# Patient Record
Sex: Female | Born: 1990 | Marital: Single | Smoking: Former smoker
Health system: Northeastern US, Academic
[De-identification: ages and names within clinical notes are randomized; demographics above are authoritative.]

## PROBLEM LIST (undated history)

## (undated) ENCOUNTER — Emergency Department: Discharge status: Absent without leave | Payer: Medicaid Other

## (undated) DIAGNOSIS — F32A Depression, unspecified: Secondary | ICD-10-CM

## (undated) DIAGNOSIS — O1490 Unspecified pre-eclampsia, unspecified trimester: Secondary | ICD-10-CM

## (undated) DIAGNOSIS — F419 Anxiety disorder, unspecified: Secondary | ICD-10-CM

## (undated) DIAGNOSIS — O24419 Gestational diabetes mellitus in pregnancy, unspecified control: Secondary | ICD-10-CM

## (undated) DIAGNOSIS — F1910 Other psychoactive substance abuse, uncomplicated: Secondary | ICD-10-CM

## (undated) DIAGNOSIS — J45909 Unspecified asthma, uncomplicated: Secondary | ICD-10-CM

## (undated) DIAGNOSIS — B86 Scabies: Secondary | ICD-10-CM

## (undated) DIAGNOSIS — Z9151 Personal history of suicidal behavior: Secondary | ICD-10-CM

## (undated) DIAGNOSIS — F449 Dissociative and conversion disorder, unspecified: Secondary | ICD-10-CM

## (undated) DIAGNOSIS — Z915 Personal history of self-harm: Secondary | ICD-10-CM

## (undated) DIAGNOSIS — L309 Dermatitis, unspecified: Secondary | ICD-10-CM

## (undated) DIAGNOSIS — F329 Major depressive disorder, single episode, unspecified: Secondary | ICD-10-CM

## (undated) DIAGNOSIS — F4310 Post-traumatic stress disorder, unspecified: Secondary | ICD-10-CM

## (undated) DIAGNOSIS — F259 Schizoaffective disorder, unspecified: Secondary | ICD-10-CM

## (undated) DIAGNOSIS — F913 Oppositional defiant disorder: Secondary | ICD-10-CM

## (undated) HISTORY — DX: Dissociative and conversion disorder, unspecified: F44.9

## (undated) HISTORY — DX: Post-traumatic stress disorder, unspecified: F43.10

## (undated) HISTORY — DX: Dermatitis, unspecified: L30.9

## (undated) HISTORY — DX: Personal history of self-harm: Z91.5

## (undated) HISTORY — PX: ADENOIDECTOMY: SUR15

## (undated) HISTORY — DX: Oppositional defiant disorder: F91.3

## (undated) HISTORY — PX: TONSILLECTOMY: SUR1361

## (undated) HISTORY — DX: Major depressive disorder, single episode, unspecified: F32.9

## (undated) HISTORY — DX: Other psychoactive substance abuse, uncomplicated: F19.10

## (undated) HISTORY — DX: Personal history of suicidal behavior: Z91.51

## (undated) HISTORY — DX: Unspecified asthma, uncomplicated: J45.909

## (undated) HISTORY — DX: Gestational diabetes mellitus in pregnancy, unspecified control: O24.419

## (undated) HISTORY — DX: Unspecified pre-eclampsia, unspecified trimester: O14.90

## (undated) HISTORY — PX: TONSILLECTOMY AND ADENOIDECTOMY: SHX28

## (undated) HISTORY — PX: OTHER SURGICAL HISTORY: SHX169

## (undated) HISTORY — DX: Anxiety disorder, unspecified: F41.9

## (undated) HISTORY — DX: Scabies: B86

## (undated) HISTORY — DX: Depression, unspecified: F32.A

---
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
## 2010-02-06 ENCOUNTER — Encounter: Payer: Self-pay | Admitting: Family Medicine

## 2010-02-22 ENCOUNTER — Telehealth: Payer: Self-pay | Admitting: Family Medicine

## 2010-04-23 ENCOUNTER — Encounter: Payer: Self-pay | Admitting: Family Medicine

## 2010-04-25 ENCOUNTER — Emergency Department: Payer: Self-pay | Admitting: Emergency Medicine

## 2010-04-26 ENCOUNTER — Encounter: Payer: Self-pay | Admitting: *Deleted

## 2010-05-10 ENCOUNTER — Telehealth: Payer: Self-pay | Admitting: *Deleted

## 2010-05-24 ENCOUNTER — Ambulatory Visit: Payer: Self-pay | Admitting: Family Medicine

## 2010-05-28 ENCOUNTER — Emergency Department: Payer: Self-pay | Admitting: Emergency Medicine

## 2010-06-14 ENCOUNTER — Encounter: Payer: Self-pay | Admitting: Family Medicine

## 2010-06-17 ENCOUNTER — Encounter: Payer: Self-pay | Admitting: Family Medicine

## 2010-07-04 ENCOUNTER — Encounter: Payer: Self-pay | Admitting: Family Medicine

## 2010-10-04 ENCOUNTER — Encounter: Payer: Self-pay | Admitting: Family Medicine

## 2010-10-18 ENCOUNTER — Ambulatory Visit: Payer: Self-pay | Admitting: Family Medicine

## 2010-11-07 ENCOUNTER — Encounter: Payer: Self-pay | Admitting: Family Medicine

## 2010-11-13 ENCOUNTER — Encounter: Payer: Self-pay | Admitting: Family Medicine

## 2011-01-29 NOTE — Consult Note (Signed)
Summary: [facility] Discharge Summary  [facility] Discharge Summary   Imported By: De Nurse 07/09/2010 17:00:03  _____________________________________________________________________  External Attachment:    Type:   Image     Comment:   External Document

## 2011-01-29 NOTE — Assessment & Plan Note (Signed)
Summary: wants referral for skin,tcb   Vital Signs:  Patient profile:   20 year old female Weight:      140.9 pounds Temp:     97.1 degrees F oral Pulse rate:   90 / minute Pulse rhythm:   regular BP sitting:   115 / 77  (right arm) Cuff size:   regular  Vitals Entered By: Loralee Pacas CMA (May 24, 2010 2:38 PM)  Primary Care Provider:  Lequita Asal  MD  CC:  derm referral and contraception.  History of Present Illness: eczema vs psoriasis- has seen dermatologist. unhappy with care. would like second opinion. now having plaque formation on hands, feet, elbows. +pruritus not improved with hydroxyzine or xyzal. reports purulent drainage from plaques.   BC- would like depo shot again today.   Allergies (verified): No Known Drug Allergies  Physical Exam  General:  Well-developed,well-nourished,in no acute distress; alert,appropriate and cooperative throughout examination. vitals reviewed.  Skin:  diffuse eczematous changes on bilateral arms, legs, and back with thick scaly plaque on R hand and L ankle/foot. no evidence of super infection.    Impression & Recommendations:  Problem # 1:  SKIN RASH (ICD-782.1) Assessment Deteriorated  seems more consistent with psoriasis; however, could be severe eczema. will try increased potency steroid. patient given steroid injection. will treat with doxycycline as well, given reported purulent drainage.   The following medications were removed from the medication list:    Triamcinolone Acetonide 0.5 % Oint (Triamcinolone acetonide) .Marland Kitchen... Apply to affected areas two times a day as needed for dry skin. avoid face, underarms, and groin. disp 90 g Her updated medication list for this problem includes:    Mometasone Furoate 0.1 % Crea (Mometasone furoate) .Marland Kitchen... Apply to affected areas twice a day for 2 weeks. disp 90 g tube. do not apply to face, underarms, groin  Orders: [facility]- Est Level  3 (95621)  Problem # 2:  CONTRACEPTIVE MANAGEMENT  (ICD-V25.09) Assessment: Unchanged patient given depo.  Orders: U Preg-[facility] (30865) Depo-Provera 150mg  (H8469)  Other Orders: Dermatology Referral (Derma) Solumedrol up to 40mg  (G2952)  Patient Instructions: 1)  We will call you with information about new dermatology referral.  2)  Take ALL of the antibiotic.  Prescriptions: MOMETASONE FUROATE 0.1 % CREA (MOMETASONE FUROATE) apply to affected areas twice a day for 2 weeks. disp 90 g tube. DO NOT APPLY TO FACE, UNDERARMS, GROIN  #1 x 0   Entered and Authorized by:   Lequita Asal  MD   Signed by:   Lequita Asal  MD on 05/24/2010   Method used:   Electronically to        [facility] [phone]* (retail)       [address] [place], [place]  [address]       Ph: [phone]       Fax: [phone]   RxID:   [id number] DOXYCYCLINE HYCLATE 100 MG CAPS (DOXYCYCLINE HYCLATE) one tab by mouth two times a day x10 days.  #20 x 0   Entered and Authorized by:   Lequita Asal  MD   Signed by:   Lequita Asal  MD on 05/24/2010   Method used:   Electronically to        [facility] [phone]* (retail)       [address] [place]  [place], [place]  [address]       Ph: [phone]       Fax: [phone]   RxID:   [id number]     Laboratory Results   Urine Tests  Date/Time Received: May 24, 2010 2:49 PM  Date/Time Reported: May 24, 2010 3:08 PM     Urine HCG: negative Comments: ...........test performed by...........Marland KitchenTerese Door, CMA  Date/Time Received:  Date/Time Reported:     Medication Administration  Injection # 1:    Medication: Solumedrol up to 40mg     Diagnosis: PSORIASIS (ICD-696.1)    Route: IM    Site: L deltoid    Exp Date: 12/30/2011    Lot #: [id number]    Mfr: [email or web address]    Patient tolerated injection without complications    Given by: Loralee Pacas CMA (May 24, 2010 3:35 PM)  Injection # 2:    Medication: Depo-Provera 150mg      Diagnosis: CONTRACEPTIVE MANAGEMENT (ICD-V25.09)    Route: IM    Site: R deltoid    Exp Date: 03/29/2012    Lot #: [id number]    Mfr: pfizer    Comments: pt to rtc 08.12 thru 08.25.2011    Patient tolerated injection without complications    Given by: Loralee Pacas CMA (May 24, 2010 3:36 PM)  Orders Added: 1)  U Preg-[facility] [81025] 2)  Dermatology Referral [Derma] 3)  Solumedrol up to 40mg  [J2920] 4)  Depo-Provera 150mg  [J1055] 5)  [facility]- Est Level  3 [03474]

## 2011-01-29 NOTE — Miscellaneous (Signed)
Summary: work in skin problem  Clinical Lists Changes   no show; skin problem.Loralee Pacas CMA  April 26, 2010 3:38 PM

## 2011-01-29 NOTE — Progress Notes (Signed)
Summary: phn msg - cxl  Phone Note Call from Patient   Caller: Mom Summary of Call: could not come due to rising gas prices Initial call taken by: De Nurse,  February 22, 2010 8:44 AM  Follow-up for Phone Call        To PCP Follow-up by: Gladstone Pih,  February 22, 2010 10:51 AM

## 2011-01-29 NOTE — Assessment & Plan Note (Signed)
 Summary: fever/sore throat,df   Vital Signs:  Patient profile:   20 year old female Height:      65 inches Weight:      137.9 pounds BMI:     23.03 Temp:     99.4 degrees F oral Pulse rate:   106 / minute BP sitting:   106 / 70  (left arm) Cuff size:   regular  Vitals Entered By: Olam Keeling (December 24, 2009 2:08 PM) CC: C/O N/V, fever, body aches Is Patient Diabetic? No Pain Assessment Patient in pain? no        Primary Care Provider:  PRESTON BLOOD  MD  CC:  C/O N/V, fever, and body aches.  History of Present Illness: COUGH Onset: Several days ago Description: wet cough, persistent Modifying factors:  Happened after flu shot, associated with pain in left chest/back.  worse with cough/palpation, fevers, chills.  Symptoms Productive: white sputum Wheezing: no Dyspnea: no Nasal discharge:mild  Fever: yes Sore throat:no  Sick contacts: no Heartburn symptoms:no   History of Asthma: no  Red Flags  Weight loss:no  Hemoptysis: no Edema: no  Also here for depo shot, upreg neg.    Habits & Providers  Alcohol-Tobacco-Diet     Tobacco Status: quit     Tobacco Counseling: to quit use of tobacco products     Year Quit: 2010  Current Medications (verified): 1)  Risperdal  1 Mg Tabs (Risperidone ) .... Two Times A Day 2)  Triamcinolone  Acetonide 0.5 % Oint (Triamcinolone  Acetonide) .... Apply To Affected Areas Two Times A Day As Needed For Dry Skin. Avoid Face, Underarms, and Groin. Disp 90 G 3)  Loratadine 10 Mg  Tabs (Loratadine) .... Once Daily As Needed For Itching 4)  Hydroxyzine  Hcl 25 Mg Tabs (Hydroxyzine  Hcl) .... One Tab By Mouth At Bedtime As Needed For Itching 5)  Azithromycin  250 Mg Tabs (Azithromycin ) .... 2 Tabs By Mouth On Day 1, Then One Tab By Mouth Daily For 4 Days After That.  Allergies (verified): No Known Drug Allergies  Review of Systems       See HPI  Physical Exam  General:  Well-developed,well-nourished,in no acute distress;  alert,appropriate and cooperative throughout examination Head:  Normocephalic and atraumatic without obvious abnormalities. No apparent alopecia or balding. Eyes:  No corneal or conjunctival inflammation noted. EOMI. Perrla.  Ears:  External ear exam shows no significant lesions or deformities.  Otoscopic examination reveals clear canals, tympanic membranes are intact bilaterally without bulging, retraction, inflammation or discharge. Hearing is grossly normal bilaterally. Nose:  External nasal examination shows no deformity or inflammation. Nasal mucosa are pink and moist without lesions or exudates. Mouth:  Oral mucosa and oropharynx without lesions or exudates.  Teeth in good repair. Neck:  No deformities, masses, or tenderness noted. Lungs:  Significant crackles heard in left base.  Otherwise clear with good air movement. Heart:  Normal rate and regular rhythm. S1 and S2 normal without gallop, murmur, click, rub or other extra sounds. Abdomen:  Bowel sounds positive,abdomen soft and non-tender without masses, organomegaly or hernias noted. Extremities:  No clubbing, cyanosis, edema, or deformity noted with normal full range of motion of all joints.   Skin:  Intact without suspicious lesions or rashes Additional Exam:  CXR: LLL pneumonia   Impression & Recommendations:  Problem # 1:  COUGH (ICD-786.2) Assessment New CXR c/w pneumonia, LLL, Azithromycin  x5 d.  Outpt treatment.  Pt well appearing.  Her updated medication list for this problem includes:  Azithromycin  250 Mg Tabs (Azithromycin ) .SABRA... 2 tabs by mouth on day 1, then one tab by mouth daily for 4 days after that.  Orders: [facility]- Est  Level 4 (99214) CXR- 2view (CXR)  Problem # 2:  PNEUMONIA, LEFT LOWER LOBE (ICD-481) Assessment: New See #1. Her updated medication list for this problem includes:    Azithromycin  250 Mg Tabs (Azithromycin ) .SABRA... 2 tabs by mouth on day 1, then one tab by mouth daily for 4 days after  that.  Problem # 3:  CONTRACEPTIVE MANAGEMENT (ICD-V25.09) Assessment: Unchanged Upreg neg, depo given today.  Orders: U Preg-[facility] (81025) Depo-Provera  150mg  (J1055)  Complete Medication List: 1)  Risperdal  1 Mg Tabs (Risperidone ) .... Two times a day 2)  Triamcinolone  Acetonide 0.5 % Oint (Triamcinolone  acetonide) .... Apply to affected areas two times a day as needed for dry skin. avoid face, underarms, and groin. disp 90 g 3)  Loratadine 10 Mg Tabs (Loratadine) .... Once daily as needed for itching 4)  Hydroxyzine  Hcl 25 Mg Tabs (Hydroxyzine  hcl) .... One tab by mouth at bedtime as needed for itching 5)  Azithromycin  250 Mg Tabs (Azithromycin ) .... 2 tabs by mouth on day 1, then one tab by mouth daily for 4 days after that.  Other Orders: Urinalysis-[facility] (00000)  Patient Instructions: 1)  Take the antibiotics, get the chest Xray, 2)  Come back to see me if you still feel bad after finishing a course of antibiotics. 3)  You can use motrin  and tylenol  every 4h. 4)  Keep well hydrated. 5)  -Dr. ONEIDA. Prescriptions: AZITHROMYCIN  250 MG TABS (AZITHROMYCIN ) 2 tabs by mouth on day 1, then one tab by mouth daily for 4 days after that.  #6 x 0   Entered and Authorized by:   Debby Petties MD   Signed by:   Debby Petties MD on 12/24/2009   Method used:   Electronically to        [facility] [phone]* (retail)       [address] [place], [place]  [address]       Ph: [phone]       Fax: [phone]   RxID:   [id number]   Laboratory Results   Urine Tests  Date/Time Received: December 24, 2009 2:18 PM  Date/Time Reported: December 24, 2009 3:52 PM   Routine Urinalysis   Color: orange Appearance: Clear Glucose: negative   (Normal Range: Negative) Bilirubin: small-icto negative   (Normal Range: Negative) Ketone: negative   (Normal Range: Negative) Spec. Gravity: 1.015   (Normal Range: 1.003-1.035) Blood: small   (Normal Range:  Negative) pH: 6.5   (Normal Range: 5.0-8.0) Protein: 100   (Normal Range: Negative) Urobilinogen: >=8.0   (Normal Range: 0-1) Nitrite: negative   (Normal Range: Negative) Leukocyte Esterace: negative   (Normal Range: Negative)  Urine Microscopic WBC/HPF: 1-5 RBC/HPF: 1-5 Bacteria/HPF: 3+ Mucous/HPF: trace Epithelial/HPF: 10-20 Casts/LPF: occ granular    Urine HCG: negative Comments: ...........test performed by...........SABRAArland Morel, CMA       Medication Administration  Injection # 1:    Medication: Depo-Provera  150mg     Diagnosis: CONTRACEPTIVE MANAGEMENT (ICD-V25.09)    Route: IM    Site: L deltoid    Exp Date: 02/27/2012    Lot #: [id number]    Mfr: [email or web address]    Comments: pt to RTC 03/14 thru 03/25/2010.Bascom Pica CMA  December 24, 2009 4:00 PM  Patient tolerated injection without complications    Given by: Bascom Pica CMA (December 24, 2009 3:59 PM)  Orders Added: 1)  U Preg-[facility] [81025] 2)  [facility]- Est  Level 4 [99214] 3)  Urinalysis-[facility] [00000] 4)  Depo-Provera  150mg  [J1055] 5)  CXR- 2view [CXR]
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
## 2011-01-29 NOTE — Consult Note (Signed)
Summary: [facility]  [facility]   Imported By: Clydell Hakim 06/28/2010 11:28:48  _____________________________________________________________________  External Attachment:    Type:   Image     Comment:   External Document
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
## 2011-01-29 NOTE — Miscellaneous (Signed)
  Clinical Lists Changes  Problems: Removed problem of OTH GENERAL CNSL&ADVICE CONTRACEPT MANAGEMENT (ICD-V25.09) Removed problem of CONTRACEPTIVE MANAGEMENT (ICD-V25.09)

## 2011-01-29 NOTE — Consult Note (Signed)
Summary: [facility]   Imported By: De Nurse 11/07/2010 14:06:20  _____________________________________________________________________  External Attachment:    Type:   Image     Comment:   External Document

## 2011-01-29 NOTE — Consult Note (Signed)
Summary: [facility]   Imported By: Bradly Bienenstock 06/22/2010 13:46:53  _____________________________________________________________________  External Attachment:    Type:   Image     Comment:   External Document

## 2011-01-29 NOTE — Miscellaneous (Signed)
  Clinical Lists Changes  Medications: Removed medication of HYDROXYZINE HCL 25 MG TABS (HYDROXYZINE HCL) one tab by mouth at bedtime as needed for itching Removed medication of AZITHROMYCIN 250 MG TABS (AZITHROMYCIN) 2 tabs by mouth on day 1, then one tab by mouth daily for 4 days after that. Removed medication of LORATADINE 10 MG  TABS (LORATADINE) once daily as needed for itching
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
## 2011-01-29 NOTE — Miscellaneous (Signed)
  Clinical Lists Changes  Problems: Removed problem of ASTHMA, MILD (ICD-493.90)

## 2011-01-29 NOTE — Letter (Signed)
Summary: Generic Letter  [facility]  [address]   [place], [place] [address]   Phone: [phone]  Fax: [phone]    04/23/2010  Bridget Carpenter Champine [address] [place], [place]  [address]  Dear Bridget Carpenter and Bridget Carpenter,   It has been my pleasure to have been your physician over the last 3 years, and to have delivered Bridget Carpenter and watched her grow. I truly have enjoyed getting to know you and helping you with your healthcare needs. Over the next several weeks, you will be assigned a new provider at [facility]. He/She will have access to all of your records and continue to provide you with excellent care. If you have any questions, please feel free to contact our office. Sincerely,   Lequita Asal  MD  Appended Document: Generic Letter mailed

## 2011-02-14 ENCOUNTER — Encounter: Payer: Self-pay | Admitting: *Deleted

## 2011-02-18 ENCOUNTER — Emergency Department: Payer: Self-pay | Admitting: Emergency Medicine
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
## 2011-09-15 ENCOUNTER — Emergency Department: Payer: Self-pay | Admitting: Emergency Medicine

## 2011-09-22 ENCOUNTER — Encounter: Payer: Self-pay | Admitting: Family Medicine

## 2011-09-22 ENCOUNTER — Ambulatory Visit (INDEPENDENT_AMBULATORY_CARE_PROVIDER_SITE_OTHER): Payer: Medicaid Other | Admitting: Family Medicine

## 2011-09-22 DIAGNOSIS — F3289 Other specified depressive episodes: Secondary | ICD-10-CM

## 2011-09-22 DIAGNOSIS — J45909 Unspecified asthma, uncomplicated: Secondary | ICD-10-CM

## 2011-09-22 DIAGNOSIS — F329 Major depressive disorder, single episode, unspecified: Secondary | ICD-10-CM

## 2011-09-22 DIAGNOSIS — Z309 Encounter for contraceptive management, unspecified: Secondary | ICD-10-CM

## 2011-09-22 DIAGNOSIS — R634 Abnormal weight loss: Secondary | ICD-10-CM

## 2011-09-22 LAB — POCT URINE PREGNANCY: Preg Test, Ur: NEGATIVE

## 2011-09-22 MED ORDER — ALBUTEROL SULFATE HFA 108 (90 BASE) MCG/ACT IN AERS: 2.0000 | INHALATION_SPRAY | Freq: Four times a day (QID) | RESPIRATORY_TRACT | Status: DC | PRN

## 2011-09-22 MED ORDER — MEDROXYPROGESTERONE ACETATE 150 MG/ML IM SUSP
150.0000 mg | Freq: Once | INTRAMUSCULAR | Status: AC
  Administered 2011-09-22: 150 mg via INTRAMUSCULAR

## 2011-09-22 NOTE — Patient Instructions (Addendum)
Nice to meet you. Continue taking albuterol every 4-6 hours for wheezing or shortness of breath. Important to quit smoking for yours and Taylor's health. You have a perfectly healthy weight right now.  However, quitting smoking and depo injections can cause weight gain as side effects.  Smoking Cessation, Tips for Success YOU CAN QUIT SMOKING If you are ready to quit smoking, congratulations! You have chosen to help yourself be healthier. Cigarettes bring nicotine, tar, carbon monoxide, and other irritants into your body. Your lungs, heart, and blood vessels will be able to work better without these poisons. There are lots of different ways to quit smoking. Nicotine gum, nicotine patches, a nicotine inhaler, or nicotine nasal spray help with physical craving. Hypnosis, support groups, and medicines help break the habit of smoking. Here are some tips to help you quit for good. 1. Throw away all cigarettes.  2. Clean and remove all ashtrays from your home, work, and car.  3. On a card, write down your reasons for quitting. Carry the card with you and read it when you get the urge to smoke.  4. Cleanse your body of nicotine. Drink enough water and fluids to keep your urine clear or pale yellow. Do this after quitting to flush the nicotine from your body.  5. Learn to predict your moods. Do not let a bad situation be your excuse to have a cigarette. Some situations in your life might tempt you into wanting a cigarette.  6. Never have "just one" cigarette. It leads to wanting another and another. Remind yourself of your decision to quit.  7. Change habits associated with smoking. If you smoked while driving or when feeling stressed, try other activities to replace smoking. Stand up when drinking your coffee. Brush your teeth after eating. Sit in a different chair when you read the paper. Avoid alcohol while trying to quit, and try to drink fewer caffeinated beverages. Alcohol and caffeine may urge you to  smoke.  8. Avoid foods and drinks that can trigger a desire to smoke, such as sugary or spicy foods and alcohol.  9. Ask people who smoke not to smoke around you.  10. Have something planned to do right after eating or having a cup of coffee. Take a walk or exercise to perk you up. This will help to keep you from overeating.  11. Try a relaxation exercise to calm you down and decrease your stress. Remember, you may be tense and nervous in the first 2 weeks after you quit, but this will pass.  12. Find new activities to keep your hands busy. Play with a pen, coin, or rubber band. Doodle or draw things on paper.  13. Brush your teeth right after eating. This will help cut down on the craving for the taste of tobacco after meals. You can try mouthwash, too.  14. Use oral substitutes, such as lemon drops, carrots, a cinnamon stick, or chewing gum, in place of cigarettes. Keep them handy so they are available when you have the urge to smoke.  15. When you have the urge to smoke, try deep breathing.  16. Designate your home as a nonsmoking area.  17. If you are a heavy smoker, ask your caregiver about a prescription for nicotine chewing gum. It can ease your withdrawal from nicotine.  18. Reward yourself. Set aside the cigarette money you save and buy yourself something nice.  19. Look for support from others. Join a support group or smoking cessation program. Ask  someone at home or at work to help you with your plan to quit smoking.  20. Always ask yourself, "Do I need this cigarette or is this just a reflex?" Tell yourself, "Today, I choose not to smoke," or "I do not want to smoke." You are reminding yourself of your decision to quit, even if you do smoke a cigarette.  HOW WILL I FEEL WHEN I QUIT SMOKING?  The benefits of not smoking start within days of quitting.   You may have symptoms of withdrawal because your body is used to nicotine (the addictive substance in cigarettes). You may crave  cigarettes, be irritable, feel very hungry, cough often, get headaches, or have difficulty concentrating.   The withdrawal symptoms are only temporary. They are strongest when you first quit but will go away within 10 to 14 days.   When withdrawal symptoms occur, stay in control. Think about your reasons for quitting. Remind yourself that these are signs that your body is healing and getting used to being without cigarettes.   Remember that withdrawal symptoms are easier to treat than the major diseases that smoking can cause.   Even after the withdrawal is over, expect periodic urges to smoke. However, these cravings are generally short-lived and will go away whether you smoke or not. Do not smoke!   If you relapse and smoke again, do not lose hope. Of the people who quit, 75% relapse. Most smokers quit 3 times before they are successful.   If you relapse, do not give up! Plan ahead and think about what you will do the next time you get the urge to smoke.  LIFE AS A NONSMOKER: MAKE IT FOR A MONTH, MAKE IT FOR LIFE Day 1 Hang this page where you will see it every day. Day 2 Get rid of all ashtrays, matches, and lighters. Day 3 Drink water. Breathe deeply between sips. Day 4 Avoid places with smoke-filled air, such as bars, clubs, or the smoking section of restaurants. Day 5 Keep track of how much money you save by not smoking. Day 6 Avoid boredom. Keep a good book with you or go to the movies. Day 7 Reward yourself! One week without smoking! Day 8 Make a dental appointment to get your teeth cleaned. Day 9 Decide how you will turn down a cigarette before it is offered to you. Day 10 Review your reasons for quitting. Day 11 Distract yourself. Stay active to keep your mind off smoking and to relieve tension. Take a walk, exercise, read a book, do a crossword puzzle, or try a new hobby. Day 12 Exercise. Get off the bus before your stop or use stairs instead of escalators. Day 13 Call on  friends for support and encouragement. Day 14 Reward yourself! Two weeks without smoking! Day 15 Practice deep breathing exercises. Day 16 Bet a friend that you can stay a nonsmoker. Day 17 Ask to sit in nonsmoking sections of restaurants. Day 18 Hang up "No Smoking" signs. Day 19 Think of yourself as a nonsmoker. Day 20 Each morning, tell yourself you will not smoke. Day 21 Reward yourself! Three weeks without smoking! Day 22 Think of smoking in negative ways.  Remember how it stains your teeth, gives you bad breath, and shortens your breath. Day 23 Eat a nutritious breakfast. Day 24 Do not relive your days as a smoker. Day 25 Hold a pencil in your hand when talking on the telephone. Day 26 Tell all your friends you do not smoke.  Day 27 Think about how much better food tastes. Day 28 Remember, one cigarette is one too many. Day 29 Take up a hobby that will keep your hands busy. Day 30 Congratulations! One month without smoking! Give yourself a big reward. Your caregiver can direct you to community resources or hospitals for support, which may include:  Group support.   Education.   Hypnosis.   Subliminal therapy.  Document Released: 09/12/2004 Document Re-Released: 03/11/2010 [facility] Patient Information 2011 [place], [place].

## 2011-09-23 ENCOUNTER — Encounter: Payer: Self-pay | Admitting: Family Medicine

## 2011-09-23 DIAGNOSIS — J45909 Unspecified asthma, uncomplicated: Secondary | ICD-10-CM | POA: Insufficient documentation

## 2011-09-23 NOTE — Assessment & Plan Note (Signed)
Encouraged return to regular psychotherapy. Seen previously at [facility] by Dr. Tresa Endo but patient attempting to establish in [place].

## 2011-09-23 NOTE — Progress Notes (Signed)
  Subjective:    Patient ID: Bridget Carpenter, female    DOB: 03/18/1991, 20 y.o.   MRN: [id number]  HPI ED f/u, Patient with her godmother and 67 year old daughter today.   1. Bronchitis. Seen at [facility] ED for a case of bronchitis about one week ago. Treated with a steroid dose pack and albuterol breathing treatments. Symptoms are much improved currently. Pt is out of albuterol because her young daughter was playing with the inhaler and wasted it. Still smoking cigarettes intermitttently. Denies SOB, wheezing, fevers, CP. Still has a mild dry cough.   2. Weight. Pt complains about significant fluxuations in weight recently. Was down to 100 lbs and up to 150 lb previously. Today is 135. Her current BMI is 22, but she complains about the way she looks and wants to gain weight. No reason other than appearance. Asks about supplements or pills to take. Denies any fatigue, dyspnea, skin changes.   3. Psych. Multiple complicated psych issues noted including multiple suicide attempts, dissociative disorder, etc. Patient states that she is currently not on any medications or therapy schedule, but does intend to restart therapy at a practice in [place] and has made contact already. Denies current exacerbation.   4. Contraception. Desires to restart depo injections today. Has used in the past, last was one year ago. Currently has regular menses, LMP one week ago.   Review of Systems See HPI otherwise negative.    Objective:   Physical Exam  Vitals reviewed. Constitutional: She is oriented to person, place, and time. She appears well-developed and well-nourished. No distress.  HENT:  Head: Normocephalic and atraumatic.  Eyes: EOM are normal. Pupils are equal, round, and reactive to light.  Neck: Neck supple. No thyromegaly present.  Cardiovascular: Normal rate, regular rhythm, normal heart sounds and intact distal pulses.   No murmur heard. Pulmonary/Chest: Effort normal. No respiratory  distress. She has wheezes. She has no rales. She exhibits no tenderness.       Mild expiratory wheezes throughout bilateral lung fields.  Abdominal: Soft. She exhibits no distension. There is no tenderness.  Musculoskeletal: She exhibits no edema and no tenderness.  Neurological: She is alert and oriented to person, place, and time.  Skin: She is not diaphoretic.  Psychiatric: She has a normal mood and affect.          Assessment & Plan:

## 2011-09-23 NOTE — Assessment & Plan Note (Signed)
Likely secondary to unstable living situation and recent psychiatric treatments (review of weight indicates she was low prior to her dissociative episode/suicide attempts) then gained with psychotropic medications. Reassured patient that her weight was perfectly healthy currently, though she desires to gain weight. Offered nutrition counseling but not interested. Follow up if continues unintentional weight loss or has fevers, fatigue, other associated symptoms.

## 2011-09-23 NOTE — Assessment & Plan Note (Signed)
Treated for viral bronchitis one week ago, still with some asymptomatic bronchoconstriction. Will refill albuterol inhaler and advised pt to take 2 treatments q4 hr then prn for wheezing, dyspnea. No signs of infection currently. Strongly advised smoking cessation. F/u in one week if not improved, may need repeat or longer prednisone taper.

## 2011-09-23 NOTE — Assessment & Plan Note (Signed)
To check upreg and start depo if negative. Would be a good candidate for implanon or IUD if patient willing in the future.

## 2011-09-25 LAB — COMPREHENSIVE METABOLIC PANEL
ALT: 10
Albumin: 4
Alkaline Phosphatase: 85
BUN: 9
Chloride: 101
Glucose, Bld: 72
Potassium: 3.8
Sodium: 138
Total Bilirubin: 0.8

## 2011-09-25 LAB — DRUGS OF ABUSE SCREEN W/O ALC, ROUTINE URINE
Benzodiazepines.: NEGATIVE
Cocaine Metabolites: NEGATIVE
Methadone: NEGATIVE
Opiate Screen, Urine: NEGATIVE
Phencyclidine (PCP): NEGATIVE

## 2011-09-25 LAB — URINE MICROSCOPIC-ADD ON

## 2011-09-25 LAB — URINALYSIS, ROUTINE W REFLEX MICROSCOPIC
Bilirubin Urine: NEGATIVE
Bilirubin Urine: NEGATIVE
Glucose, UA: NEGATIVE
Glucose, UA: NEGATIVE
Hgb urine dipstick: NEGATIVE
Ketones, ur: NEGATIVE
Protein, ur: NEGATIVE
Urobilinogen, UA: 1
pH: 6

## 2011-09-25 LAB — GC/CHLAMYDIA PROBE AMP, URINE

## 2011-09-25 LAB — CBC
Hemoglobin: 13.2
RBC: 4.35

## 2011-09-25 LAB — DIFFERENTIAL
Basophils Relative: 0
Eosinophils Absolute: 0.5
Eosinophils Relative: 4
Monocytes Relative: 7
Neutrophils Relative %: 70

## 2011-09-25 LAB — URINE CULTURE: Culture: NO GROWTH

## 2011-09-26 LAB — DIFFERENTIAL
Basophils Absolute: 0
Eosinophils Absolute: 0.3
Eosinophils Relative: 5
Lymphocytes Relative: 41
Monocytes Absolute: 0.5

## 2011-09-26 LAB — COMPREHENSIVE METABOLIC PANEL
ALT: 9
AST: 15
Alkaline Phosphatase: 73
CO2: 26
Calcium: 9.2
Chloride: 105
Glucose, Bld: 91
Potassium: 4.3
Sodium: 139

## 2011-09-26 LAB — CBC
Platelets: 234
RBC: 3.97
WBC: 5.5

## 2011-09-30 LAB — COMPREHENSIVE METABOLIC PANEL
Alkaline Phosphatase: 63
BUN: 14
Chloride: 107
Creatinine, Ser: 0.78
Glucose, Bld: 84
Potassium: 3.7
Total Bilirubin: 1.6 — ABNORMAL HIGH
Total Protein: 7.1

## 2011-09-30 LAB — AMPHETAMINES URINE CONFIRMATION
Amphetamines: 39000 ng/mL
Methamphetamine GC/MS, Ur: NEGATIVE
Methylenedioxyamphetamine: NEGATIVE

## 2011-09-30 LAB — URINALYSIS, ROUTINE W REFLEX MICROSCOPIC
Glucose, UA: NEGATIVE
Ketones, ur: NEGATIVE
Nitrite: NEGATIVE
Protein, ur: NEGATIVE
Urobilinogen, UA: 0.2

## 2011-09-30 LAB — BARBITURATE, URINE, CONFIRMATION
Amobarbital UR Quant: NEGATIVE
Butabarbital UR Quant: NEGATIVE
Secobarbital GC/MS Conf: NEGATIVE

## 2011-09-30 LAB — DRUGS OF ABUSE SCREEN W/O ALC, ROUTINE URINE
Amphetamine Screen, Ur: POSITIVE — AB
Cocaine Metabolites: NEGATIVE
Marijuana Metabolite: POSITIVE — AB
Opiate Screen, Urine: NEGATIVE
Propoxyphene: NEGATIVE

## 2011-09-30 LAB — PREGNANCY, URINE: Preg Test, Ur: NEGATIVE

## 2011-09-30 LAB — THC (MARIJUANA), URINE, CONFIRMATION: Marijuana, Ur-Confirmation: 28 ng/mL

## 2011-10-03 ENCOUNTER — Encounter: Payer: Self-pay | Admitting: Family Medicine

## 2011-10-10 LAB — CBC
HCT: 33.2 — ABNORMAL LOW
MCHC: 35.1
MCV: 90.8
Platelets: 300
WBC: 17.2 — ABNORMAL HIGH

## 2011-10-13 LAB — POCT URINALYSIS DIP (DEVICE)
Bilirubin Urine: NEGATIVE
Bilirubin Urine: NEGATIVE
Bilirubin Urine: NEGATIVE
Bilirubin Urine: NEGATIVE
Hgb urine dipstick: NEGATIVE
Hgb urine dipstick: NEGATIVE
Hgb urine dipstick: NEGATIVE
Hgb urine dipstick: NEGATIVE
Ketones, ur: NEGATIVE
Ketones, ur: NEGATIVE
Ketones, ur: NEGATIVE
Nitrite: NEGATIVE
Nitrite: NEGATIVE
Protein, ur: NEGATIVE
pH: 6
pH: 6.5
pH: 5.5
pH: 7

## 2011-10-14 LAB — URINALYSIS, ROUTINE W REFLEX MICROSCOPIC
Bilirubin Urine: NEGATIVE
Hgb urine dipstick: NEGATIVE
Nitrite: NEGATIVE
Specific Gravity, Urine: 1.025
pH: 6

## 2011-10-14 LAB — WET PREP, GENITAL
Clue Cells Wet Prep HPF POC: NONE SEEN
Trich, Wet Prep: NONE SEEN

## 2011-10-30 ENCOUNTER — Ambulatory Visit: Payer: Medicaid Other | Admitting: Family Medicine

## 2011-10-31 ENCOUNTER — Encounter: Payer: Self-pay | Admitting: Family Medicine

## 2011-10-31 ENCOUNTER — Ambulatory Visit (INDEPENDENT_AMBULATORY_CARE_PROVIDER_SITE_OTHER): Payer: Medicaid Other | Admitting: Family Medicine

## 2011-10-31 DIAGNOSIS — H9190 Unspecified hearing loss, unspecified ear: Secondary | ICD-10-CM

## 2011-10-31 DIAGNOSIS — J339 Nasal polyp, unspecified: Secondary | ICD-10-CM

## 2011-10-31 MED ORDER — FLUTICASONE PROPIONATE 50 MCG/ACT NA SUSP: 2.0000 | Freq: Every day | NASAL | Status: DC

## 2011-10-31 NOTE — Progress Notes (Signed)
  Subjective:    Patient ID: [name], female    DOB: [birth year], 20 y.o.   MRN: [id number]  HPI Pt Bridget/o today of difficulty hearing of her right ear. She started to notice it a couple of days ago. No pain, afebrile. Mentions she has been congested lately and her right nostril bothers her. There is no pain but there is sensation of congestion and like something is occluding. Her left nostril feels as usual. There is not history of viral infection recently. No history of sick contacts. No nausea, vertigo, balance problems or changes in vision.   Review of Systems   Per HPI  Objective:   Physical Exam  Constitutional: She is oriented to person, place, and time. She appears well-developed and well-nourished. No distress.  HENT:  Left Ear: External ear normal.  Mouth/Throat: No oropharyngeal exudate.       EARS: Right ear 100% occluded by wax. Left ear with some wax present but TM visible and normal. After ear lavage: Right ear with visible TM, no perforations, or fluid levels. Hearing back to normal. NOSE: Right nostril with mucosa hypertrophy and a lesion polyp like occluding 95%. No nasal discharge no pain to palpation of adjacent sinus. Left nostril: hypertrophic mucosa and also diminished opening about 50 %.  Eyes: Pupils are equal, round, and reactive to light.  Cardiovascular: Normal rate.   Pulmonary/Chest: Breath sounds normal.  Abdominal: Bowel sounds are normal.  Musculoskeletal: She exhibits no edema.  Lymphadenopathy:    She has no cervical adenopathy.  Neurological: She is alert and oriented to person, place, and time. No cranial nerve deficit.          Assessment & Plan:

## 2011-10-31 NOTE — Assessment & Plan Note (Signed)
Right nostril with mucosal hypertrophy and 95% occluded with tissue( polyp like). Left nostril with mucosal hypertrophy but moderated. Plan: Flonase 2 puff a day per nostril and  ENT evaluation.

## 2011-10-31 NOTE — Patient Instructions (Addendum)
It has been a pleasure to meet you. Please take medications as prescribed. It is important to keep the appointment for ENT to further evaluate your nasal lesions. Make a followup appointment with your  PCP in a month.

## 2011-11-03 ENCOUNTER — Telehealth: Payer: Self-pay | Admitting: *Deleted

## 2011-11-03 NOTE — Telephone Encounter (Signed)
Pt's grandmother informed of appt with Dr. Willeen Cass at [facility] on 11.16.2012 @130  pm. Expressed the importance of no show and that if this appt does not work for her she will need to call their office at  Phone: [phone] to cancel/reschedule or she MAY be charged a fee. We are NOT responsible for rescheduling this appt for her. Grandmother understood and will relay message to pt.Loralee Pacas [place]

## 2011-12-05 ENCOUNTER — Emergency Department: Payer: Self-pay | Admitting: Emergency Medicine

## 2012-02-26 ENCOUNTER — Encounter: Payer: Medicaid Other | Admitting: Family Medicine

## 2012-10-12 ENCOUNTER — Emergency Department: Payer: Self-pay | Admitting: Emergency Medicine

## 2012-10-12 LAB — COMPREHENSIVE METABOLIC PANEL
Albumin: 3.5 g/dL (ref 3.4–5.0)
Alkaline Phosphatase: 75 U/L (ref 50–136)
BUN: 9 mg/dL (ref 7–18)
EGFR (African American): >60
Glucose: 86 mg/dL (ref 65–99)
SGOT(AST): 26 U/L (ref 15–37)
SGPT (ALT): 14 U/L (ref 12–78)
Total Protein: 7.2 g/dL (ref 6.4–8.2)

## 2012-10-12 LAB — SALICYLATE LEVEL: Salicylates, Serum: <1.7 mg/dL

## 2012-10-12 LAB — TSH: Thyroid Stimulating Horm: 1.59 u[IU]/mL

## 2012-10-12 LAB — CBC
HGB: 13.1 g/dL (ref 12.0–16.0)
MCHC: 34 g/dL (ref 32.0–36.0)
RBC: 4.05 10*6/uL (ref 3.80–5.20)

## 2012-10-12 LAB — ACETAMINOPHEN LEVEL: Acetaminophen: <2 ug/mL

## 2012-11-26 ENCOUNTER — Emergency Department: Payer: Self-pay | Admitting: Emergency Medicine

## 2012-11-26 LAB — COMPREHENSIVE METABOLIC PANEL
Alkaline Phosphatase: 68 U/L (ref 50–136)
Calcium, Total: 8.7 mg/dL (ref 8.5–10.1)
Chloride: 107 mmol/L (ref 98–107)
Co2: 26 mmol/L (ref 21–32)
Creatinine: 0.71 mg/dL (ref 0.60–1.30)
EGFR (Non-African Amer.): >60
Osmolality: 275 (ref 275–301)
SGOT(AST): 23 U/L (ref 15–37)
SGPT (ALT): 14 U/L (ref 12–78)

## 2012-11-26 LAB — CBC
HCT: 38.2 % (ref 35.0–47.0)
MCH: 31.3 pg (ref 26.0–34.0)
MCV: 94 fL (ref 80–100)
Platelet: 285 10*3/uL (ref 150–440)
RBC: 4.06 10*6/uL (ref 3.80–5.20)
WBC: 10.5 10*3/uL (ref 3.6–11.0)

## 2012-11-26 LAB — SALICYLATE LEVEL: Salicylates, Serum: <1.7 mg/dL

## 2012-11-26 LAB — DRUG SCREEN, URINE
Amphetamines, Ur Screen: NEGATIVE (ref ?–1000)
Cannabinoid 50 Ng, Ur ~~LOC~~: NEGATIVE (ref ?–50)
MDMA (Ecstasy)Ur Screen: NEGATIVE (ref ?–500)
Methadone, Ur Screen: NEGATIVE (ref ?–300)
Opiate, Ur Screen: NEGATIVE (ref ?–300)
Phencyclidine (PCP) Ur S: NEGATIVE (ref ?–25)

## 2012-11-26 LAB — ACETAMINOPHEN LEVEL: Acetaminophen: <2 ug/mL

## 2012-12-06 ENCOUNTER — Emergency Department: Payer: Self-pay | Admitting: Emergency Medicine

## 2012-12-06 LAB — URINALYSIS, COMPLETE
Bacteria: NONE SEEN
Nitrite: NEGATIVE
Ph: 6 (ref 4.5–8.0)
RBC,UR: 1 /HPF (ref 0–5)
Specific Gravity: 1.009 (ref 1.003–1.030)
WBC UR: 2 /HPF (ref 0–5)

## 2012-12-06 LAB — CBC
HCT: 38.6 % (ref 35.0–47.0)
HGB: 13.6 g/dL (ref 12.0–16.0)
MCH: 33.5 pg (ref 26.0–34.0)
MCHC: 35.2 g/dL (ref 32.0–36.0)
MCV: 95 fL (ref 80–100)
RDW: 13.4 % (ref 11.5–14.5)

## 2012-12-06 LAB — COMPREHENSIVE METABOLIC PANEL
Albumin: 4 g/dL (ref 3.4–5.0)
Alkaline Phosphatase: 80 U/L (ref 50–136)
BUN: 18 mg/dL (ref 7–18)
Chloride: 109 mmol/L — ABNORMAL HIGH (ref 98–107)
Creatinine: 1.09 mg/dL (ref 0.60–1.30)
EGFR (African American): >60
EGFR (Non-African Amer.): >60
Glucose: 83 mg/dL (ref 65–99)
SGPT (ALT): 13 U/L (ref 12–78)
Sodium: 141 mmol/L (ref 136–145)
Total Protein: 8.2 g/dL (ref 6.4–8.2)

## 2012-12-06 LAB — ETHANOL
Ethanol %: <0.003 % (ref 0.000–0.080)
Ethanol: <3 mg/dL

## 2012-12-06 LAB — SALICYLATE LEVEL: Salicylates, Serum: <1.7 mg/dL

## 2012-12-06 LAB — DRUG SCREEN, URINE
Amphetamines, Ur Screen: NEGATIVE (ref ?–1000)
Cocaine Metabolite,Ur ~~LOC~~: POSITIVE (ref ?–300)
MDMA (Ecstasy)Ur Screen: NEGATIVE (ref ?–500)
Opiate, Ur Screen: NEGATIVE (ref ?–300)
Phencyclidine (PCP) Ur S: NEGATIVE (ref ?–25)
Tricyclic, Ur Screen: NEGATIVE (ref ?–1000)

## 2012-12-06 LAB — TSH: Thyroid Stimulating Horm: 2.69 u[IU]/mL

## 2013-02-21 ENCOUNTER — Emergency Department: Payer: Self-pay | Admitting: Unknown Physician Specialty

## 2013-02-21 LAB — URINALYSIS, COMPLETE
Bilirubin,UR: NEGATIVE
Blood: NEGATIVE
Glucose,UR: NEGATIVE mg/dL (ref 0–75)
Leukocyte Esterase: NEGATIVE
Ph: 7 (ref 4.5–8.0)
Protein: NEGATIVE
Squamous Epithelial: <1
WBC UR: <1 /HPF (ref 0–5)

## 2013-02-21 LAB — COMPREHENSIVE METABOLIC PANEL
Albumin: 3.9 g/dL (ref 3.4–5.0)
Alkaline Phosphatase: 74 U/L (ref 50–136)
Anion Gap: 5 — ABNORMAL LOW (ref 7–16)
Bilirubin,Total: 0.3 mg/dL (ref 0.2–1.0)
Chloride: 105 mmol/L (ref 98–107)
Co2: 26 mmol/L (ref 21–32)
Creatinine: 0.85 mg/dL (ref 0.60–1.30)
Glucose: 83 mg/dL (ref 65–99)
Osmolality: 270 (ref 275–301)
Potassium: 3.8 mmol/L (ref 3.5–5.1)
SGOT(AST): 23 U/L (ref 15–37)

## 2013-02-21 LAB — CBC
Platelet: 244 10*3/uL (ref 150–440)
RBC: 4.19 10*6/uL (ref 3.80–5.20)
WBC: 6.7 10*3/uL (ref 3.6–11.0)

## 2013-02-21 LAB — LIPASE, BLOOD: Lipase: 130 U/L (ref 73–393)

## 2013-08-03 ENCOUNTER — Emergency Department: Payer: Self-pay | Admitting: Emergency Medicine

## 2013-08-03 LAB — URINALYSIS, COMPLETE
Ketone: NEGATIVE
Ph: 6 (ref 4.5–8.0)
Protein: NEGATIVE
RBC,UR: 2 /HPF (ref 0–5)
Specific Gravity: 1.016 (ref 1.003–1.030)

## 2013-08-14 ENCOUNTER — Inpatient Hospital Stay: Payer: Self-pay | Admitting: Psychiatry

## 2013-08-14 LAB — URINALYSIS, COMPLETE
Bilirubin,UR: NEGATIVE
Blood: NEGATIVE
Glucose,UR: NEGATIVE mg/dL (ref 0–75)
Leukocyte Esterase: NEGATIVE
Nitrite: NEGATIVE
Ph: 6 (ref 4.5–8.0)
Protein: NEGATIVE
Specific Gravity: 1.026 (ref 1.003–1.030)
WBC UR: 2 /HPF (ref 0–5)

## 2013-08-14 LAB — CBC
HCT: 39.1 % (ref 35.0–47.0)
MCHC: 34.3 g/dL (ref 32.0–36.0)
MCV: 92 fL (ref 80–100)
Platelet: 247 10*3/uL (ref 150–440)
RBC: 4.25 10*6/uL (ref 3.80–5.20)
RDW: 13 % (ref 11.5–14.5)
WBC: 6.5 10*3/uL (ref 3.6–11.0)

## 2013-08-14 LAB — COMPREHENSIVE METABOLIC PANEL
Albumin: 3.8 g/dL (ref 3.4–5.0)
Anion Gap: 5 — ABNORMAL LOW (ref 7–16)
BUN: 13 mg/dL (ref 7–18)
Calcium, Total: 8.6 mg/dL (ref 8.5–10.1)
Co2: 28 mmol/L (ref 21–32)
Creatinine: 0.88 mg/dL (ref 0.60–1.30)
EGFR (African American): >60
Glucose: 90 mg/dL (ref 65–99)
Osmolality: 275 (ref 275–301)
SGOT(AST): 24 U/L (ref 15–37)
Sodium: 138 mmol/L (ref 136–145)
Total Protein: 7.9 g/dL (ref 6.4–8.2)

## 2013-08-14 LAB — ETHANOL
Ethanol %: <0.003 % (ref 0.000–0.080)
Ethanol: <3 mg/dL

## 2013-08-14 LAB — PREGNANCY, URINE: Pregnancy Test, Urine: NEGATIVE m[IU]/mL

## 2013-08-16 LAB — DRUG SCREEN, URINE
Amphetamines, Ur Screen: NEGATIVE (ref ?–1000)
Benzodiazepine, Ur Scrn: NEGATIVE (ref ?–200)
Cocaine Metabolite,Ur ~~LOC~~: POSITIVE (ref ?–300)
MDMA (Ecstasy)Ur Screen: NEGATIVE (ref ?–500)
Methadone, Ur Screen: NEGATIVE (ref ?–300)

## 2013-10-29 ENCOUNTER — Emergency Department: Payer: Self-pay | Admitting: Emergency Medicine

## 2013-12-17 ENCOUNTER — Emergency Department: Payer: Self-pay | Admitting: Emergency Medicine

## 2013-12-17 LAB — COMPREHENSIVE METABOLIC PANEL
Albumin: 3.4 g/dL (ref 3.4–5.0)
Alkaline Phosphatase: 85 U/L
BUN: 16 mg/dL (ref 7–18)
Calcium, Total: 9.6 mg/dL (ref 8.5–10.1)
Co2: 30 mmol/L (ref 21–32)
Creatinine: 0.94 mg/dL (ref 0.60–1.30)
EGFR (African American): >60
Glucose: 80 mg/dL (ref 65–99)
Potassium: 4 mmol/L (ref 3.5–5.1)
SGOT(AST): 38 U/L — ABNORMAL HIGH (ref 15–37)
SGPT (ALT): 24 U/L (ref 12–78)
Sodium: 138 mmol/L (ref 136–145)
Total Protein: 7.7 g/dL (ref 6.4–8.2)

## 2013-12-17 LAB — URINALYSIS, COMPLETE
Glucose,UR: NEGATIVE mg/dL (ref 0–75)
Leukocyte Esterase: NEGATIVE
Nitrite: NEGATIVE
Ph: 6 (ref 4.5–8.0)
Protein: NEGATIVE
Specific Gravity: 1.023 (ref 1.003–1.030)

## 2013-12-17 LAB — DRUG SCREEN, URINE
Amphetamines, Ur Screen: NEGATIVE (ref ?–1000)
Benzodiazepine, Ur Scrn: NEGATIVE (ref ?–200)
Cocaine Metabolite,Ur ~~LOC~~: NEGATIVE (ref ?–300)
Methadone, Ur Screen: NEGATIVE (ref ?–300)
Phencyclidine (PCP) Ur S: NEGATIVE (ref ?–25)
Tricyclic, Ur Screen: NEGATIVE (ref ?–1000)

## 2013-12-17 LAB — CBC
HCT: 37.6 % (ref 35.0–47.0)
MCH: 31 pg (ref 26.0–34.0)
Platelet: 258 10*3/uL (ref 150–440)

## 2013-12-17 LAB — ETHANOL
Ethanol %: <0.003 % (ref 0.000–0.080)
Ethanol: <3 mg/dL

## 2014-01-04 ENCOUNTER — Emergency Department: Payer: Self-pay | Admitting: Emergency Medicine

## 2014-01-04 LAB — DRUG SCREEN, URINE

## 2014-01-04 LAB — COMPREHENSIVE METABOLIC PANEL
ALBUMIN: 3.2 g/dL — AB (ref 3.4–5.0)
ALK PHOS: 73 U/L
ANION GAP: 5 — AB (ref 7–16)
BILIRUBIN TOTAL: 0.3 mg/dL (ref 0.2–1.0)
BUN: 14 mg/dL (ref 7–18)
CO2: 27 mmol/L (ref 21–32)
CREATININE: 0.8 mg/dL (ref 0.60–1.30)
Calcium, Total: 8.7 mg/dL (ref 8.5–10.1)
Chloride: 107 mmol/L (ref 98–107)
EGFR (African American): >60
Glucose: 87 mg/dL (ref 65–99)
Osmolality: 277 (ref 275–301)
Potassium: 3.7 mmol/L (ref 3.5–5.1)
SGOT(AST): 35 U/L (ref 15–37)
SGPT (ALT): 18 U/L (ref 12–78)
Sodium: 139 mmol/L (ref 136–145)
Total Protein: 7.5 g/dL (ref 6.4–8.2)

## 2014-01-04 LAB — CBC
HCT: 36.1 % (ref 35.0–47.0)
HGB: 12.2 g/dL (ref 12.0–16.0)
MCH: 31.7 pg (ref 26.0–34.0)
MCHC: 33.9 g/dL (ref 32.0–36.0)
MCV: 94 fL (ref 80–100)
Platelet: 245 10*3/uL (ref 150–440)
RBC: 3.85 10*6/uL (ref 3.80–5.20)
RDW: 13.8 % (ref 11.5–14.5)
WBC: 9.2 10*3/uL (ref 3.6–11.0)

## 2014-01-04 LAB — ACETAMINOPHEN LEVEL: Acetaminophen: <2 ug/mL

## 2014-01-04 LAB — PREGNANCY, URINE: PREGNANCY TEST, URINE: NEGATIVE m[IU]/mL

## 2014-01-04 LAB — TSH: Thyroid Stimulating Horm: 2.64 u[IU]/mL

## 2014-01-04 LAB — ETHANOL
Ethanol %: <0.003 % (ref 0.000–0.080)
Ethanol: <3 mg/dL

## 2014-01-04 LAB — SALICYLATE LEVEL

## 2014-02-04 ENCOUNTER — Ambulatory Visit: Payer: Self-pay

## 2014-02-04 LAB — URINALYSIS, COMPLETE
BACTERIA: NEGATIVE
BILIRUBIN, UR: NEGATIVE
Blood: NEGATIVE
GLUCOSE, UR: NEGATIVE mg/dL (ref 0–75)
KETONE: NEGATIVE
LEUKOCYTE ESTERASE: NEGATIVE
Nitrite: NEGATIVE
PH: 6 (ref 4.5–8.0)
Protein: NEGATIVE
SPECIFIC GRAVITY: 1.029 (ref 1.003–1.030)

## 2014-02-10 ENCOUNTER — Emergency Department: Payer: Self-pay | Admitting: Emergency Medicine

## 2014-02-10 LAB — URINALYSIS, COMPLETE
BACTERIA: NONE SEEN
BILIRUBIN, UR: NEGATIVE
BLOOD: NEGATIVE
Glucose,UR: NEGATIVE mg/dL (ref 0–75)
Ketone: NEGATIVE
Nitrite: NEGATIVE
Ph: 6 (ref 4.5–8.0)
Protein: NEGATIVE
SPECIFIC GRAVITY: 1.025 (ref 1.003–1.030)
Squamous Epithelial: 5
WBC UR: 3 /HPF (ref 0–5)

## 2014-02-10 LAB — COMPREHENSIVE METABOLIC PANEL
Albumin: 3.7 g/dL (ref 3.4–5.0)
Alkaline Phosphatase: 68 U/L
Anion Gap: 7 (ref 7–16)
BILIRUBIN TOTAL: 0.3 mg/dL (ref 0.2–1.0)
BUN: 19 mg/dL — ABNORMAL HIGH (ref 7–18)
CALCIUM: 9.8 mg/dL (ref 8.5–10.1)
CREATININE: 0.61 mg/dL (ref 0.60–1.30)
Chloride: 105 mmol/L (ref 98–107)
Co2: 24 mmol/L (ref 21–32)
EGFR (African American): >60
Glucose: 95 mg/dL (ref 65–99)
Osmolality: 274 (ref 275–301)
Potassium: 3.9 mmol/L (ref 3.5–5.1)
SGOT(AST): 29 U/L (ref 15–37)
SGPT (ALT): 17 U/L (ref 12–78)
Sodium: 136 mmol/L (ref 136–145)
TOTAL PROTEIN: 8.5 g/dL — AB (ref 6.4–8.2)

## 2014-02-10 LAB — ETHANOL
Ethanol %: <0.003 % (ref 0.000–0.080)
Ethanol: <3 mg/dL

## 2014-02-10 LAB — CBC
HCT: 40.4 % (ref 35.0–47.0)
HGB: 13.3 g/dL (ref 12.0–16.0)
MCH: 30.9 pg (ref 26.0–34.0)
MCHC: 33 g/dL (ref 32.0–36.0)
MCV: 94 fL (ref 80–100)
Platelet: 247 10*3/uL (ref 150–440)
RBC: 4.32 10*6/uL (ref 3.80–5.20)
RDW: 13.9 % (ref 11.5–14.5)
WBC: 11.4 10*3/uL — ABNORMAL HIGH (ref 3.6–11.0)

## 2014-02-10 LAB — TSH: Thyroid Stimulating Horm: 2.58 u[IU]/mL

## 2014-02-10 LAB — SALICYLATE LEVEL

## 2014-02-10 LAB — DRUG SCREEN, URINE

## 2014-02-10 LAB — ACETAMINOPHEN LEVEL: Acetaminophen: <2 ug/mL

## 2014-08-13 ENCOUNTER — Inpatient Hospital Stay: Payer: Self-pay | Admitting: Psychiatry

## 2014-08-13 LAB — CBC
HCT: 43.7 % (ref 35.0–47.0)
HGB: 14.3 g/dL (ref 12.0–16.0)
MCH: 31.6 pg (ref 26.0–34.0)
MCHC: 32.6 g/dL (ref 32.0–36.0)
MCV: 97 fL (ref 80–100)
PLATELETS: 263 10*3/uL (ref 150–440)
RBC: 4.52 10*6/uL (ref 3.80–5.20)
RDW: 13.1 % (ref 11.5–14.5)
WBC: 9.8 10*3/uL (ref 3.6–11.0)

## 2014-08-13 LAB — ETHANOL: Ethanol %: <0.003 % (ref 0.000–0.080)

## 2014-08-13 LAB — URINALYSIS, COMPLETE
GLUCOSE, UR: NEGATIVE mg/dL (ref 0–75)
Hyaline Cast: 17
Leukocyte Esterase: NEGATIVE
NITRITE: NEGATIVE
PH: 5 (ref 4.5–8.0)
RBC,UR: 280 /HPF (ref 0–5)
Specific Gravity: 1.031 (ref 1.003–1.030)
Squamous Epithelial: 1
WBC UR: 6 /HPF (ref 0–5)

## 2014-08-13 LAB — DRUG SCREEN, URINE
Amphetamines, Ur Screen: NEGATIVE (ref ?–1000)
BARBITURATES, UR SCREEN: NEGATIVE (ref ?–200)
Benzodiazepine, Ur Scrn: NEGATIVE (ref ?–200)
CANNABINOID 50 NG, UR ~~LOC~~: POSITIVE (ref ?–50)
COCAINE METABOLITE, UR ~~LOC~~: POSITIVE (ref ?–300)
MDMA (ECSTASY) UR SCREEN: NEGATIVE (ref ?–500)
Methadone, Ur Screen: NEGATIVE (ref ?–300)
Opiate, Ur Screen: NEGATIVE (ref ?–300)
Phencyclidine (PCP) Ur S: NEGATIVE (ref ?–25)
Tricyclic, Ur Screen: NEGATIVE (ref ?–1000)

## 2014-08-13 LAB — COMPREHENSIVE METABOLIC PANEL
ALK PHOS: 58 U/L
ANION GAP: 9 (ref 7–16)
Albumin: 3.9 g/dL (ref 3.4–5.0)
BILIRUBIN TOTAL: 1.1 mg/dL — AB (ref 0.2–1.0)
BUN: 11 mg/dL (ref 7–18)
CHLORIDE: 110 mmol/L — AB (ref 98–107)
CO2: 25 mmol/L (ref 21–32)
Calcium, Total: 8.8 mg/dL (ref 8.5–10.1)
Creatinine: 1.23 mg/dL (ref 0.60–1.30)
GLUCOSE: 87 mg/dL (ref 65–99)
Osmolality: 286 (ref 275–301)
Potassium: 3.2 mmol/L — ABNORMAL LOW (ref 3.5–5.1)
SGOT(AST): 29 U/L (ref 15–37)
SGPT (ALT): 14 U/L
SODIUM: 144 mmol/L (ref 136–145)
Total Protein: 8 g/dL (ref 6.4–8.2)

## 2014-08-13 LAB — PREGNANCY, URINE: PREGNANCY TEST, URINE: NEGATIVE m[IU]/mL

## 2014-08-13 LAB — SALICYLATE LEVEL: SALICYLATES, SERUM: 2.2 mg/dL

## 2014-08-13 LAB — CK
CK, TOTAL: 368 U/L — AB
CK, Total: 602 U/L — ABNORMAL HIGH

## 2014-08-13 LAB — ACETAMINOPHEN LEVEL

## 2014-10-20 ENCOUNTER — Emergency Department: Payer: Self-pay | Admitting: Emergency Medicine

## 2014-10-20 LAB — URINALYSIS, COMPLETE
Blood: NEGATIVE
Glucose,UR: NEGATIVE mg/dL (ref 0–75)
KETONE: NEGATIVE
Leukocyte Esterase: NEGATIVE
Nitrite: NEGATIVE
PH: 5 (ref 4.5–8.0)
Specific Gravity: 1.03 (ref 1.003–1.030)
Squamous Epithelial: 2
WBC UR: 3 /HPF (ref 0–5)

## 2014-10-20 LAB — DRUG SCREEN, URINE
AMPHETAMINES, UR SCREEN: NEGATIVE (ref ?–1000)
BARBITURATES, UR SCREEN: NEGATIVE (ref ?–200)
Benzodiazepine, Ur Scrn: NEGATIVE (ref ?–200)
Cannabinoid 50 Ng, Ur ~~LOC~~: POSITIVE (ref ?–50)
Cocaine Metabolite,Ur ~~LOC~~: POSITIVE (ref ?–300)
MDMA (Ecstasy)Ur Screen: NEGATIVE (ref ?–500)
METHADONE, UR SCREEN: NEGATIVE (ref ?–300)
Opiate, Ur Screen: NEGATIVE (ref ?–300)
Phencyclidine (PCP) Ur S: NEGATIVE (ref ?–25)
Tricyclic, Ur Screen: NEGATIVE (ref ?–1000)

## 2014-10-20 LAB — COMPREHENSIVE METABOLIC PANEL
ALK PHOS: 64 U/L
ANION GAP: 6 — AB (ref 7–16)
AST: 26 U/L (ref 15–37)
Albumin: 3.7 g/dL (ref 3.4–5.0)
BUN: 10 mg/dL (ref 7–18)
Bilirubin,Total: 0.9 mg/dL (ref 0.2–1.0)
CHLORIDE: 112 mmol/L — AB (ref 98–107)
Calcium, Total: 8.7 mg/dL (ref 8.5–10.1)
Co2: 26 mmol/L (ref 21–32)
Creatinine: 0.93 mg/dL (ref 0.60–1.30)
EGFR (African American): >60
EGFR (Non-African Amer.): >60
Glucose: 101 mg/dL — ABNORMAL HIGH (ref 65–99)
Osmolality: 286 (ref 275–301)
POTASSIUM: 3.3 mmol/L — AB (ref 3.5–5.1)
SGPT (ALT): 18 U/L
SODIUM: 144 mmol/L (ref 136–145)
TOTAL PROTEIN: 7.6 g/dL (ref 6.4–8.2)

## 2014-10-20 LAB — CBC
HCT: 39.8 % (ref 35.0–47.0)
HGB: 12.7 g/dL (ref 12.0–16.0)
MCH: 30.7 pg (ref 26.0–34.0)
MCHC: 31.9 g/dL — AB (ref 32.0–36.0)
MCV: 96 fL (ref 80–100)
Platelet: 277 10*3/uL (ref 150–440)
RBC: 4.14 10*6/uL (ref 3.80–5.20)
RDW: 14.1 % (ref 11.5–14.5)
WBC: 8.9 10*3/uL (ref 3.6–11.0)

## 2014-10-20 LAB — ETHANOL: Ethanol: <3 mg/dL (ref 0–80)

## 2014-10-20 LAB — SALICYLATE LEVEL: Salicylates, Serum: <1.7 mg/dL

## 2014-10-20 LAB — ACETAMINOPHEN LEVEL: Acetaminophen: <2 ug/mL

## 2014-10-21 LAB — URINE CULTURE

## 2014-10-31 ENCOUNTER — Emergency Department: Payer: Self-pay | Admitting: Emergency Medicine

## 2014-10-31 LAB — COMPREHENSIVE METABOLIC PANEL
ALBUMIN: 4.1 g/dL (ref 3.4–5.0)
ANION GAP: 5 — AB (ref 7–16)
Alkaline Phosphatase: 71 U/L
BUN: 11 mg/dL (ref 7–18)
Bilirubin,Total: 0.5 mg/dL (ref 0.2–1.0)
CALCIUM: 9.2 mg/dL (ref 8.5–10.1)
Chloride: 108 mmol/L — ABNORMAL HIGH (ref 98–107)
Co2: 28 mmol/L (ref 21–32)
Creatinine: 0.94 mg/dL (ref 0.60–1.30)
Glucose: 84 mg/dL (ref 65–99)
Osmolality: 280 (ref 275–301)
Potassium: 3.8 mmol/L (ref 3.5–5.1)
SGOT(AST): 17 U/L (ref 15–37)
SGPT (ALT): 17 U/L
Sodium: 141 mmol/L (ref 136–145)
Total Protein: 8.5 g/dL — ABNORMAL HIGH (ref 6.4–8.2)

## 2014-10-31 LAB — CBC
HCT: 42.6 % (ref 35.0–47.0)
HGB: 13.8 g/dL (ref 12.0–16.0)
MCH: 31.4 pg (ref 26.0–34.0)
MCHC: 32.4 g/dL (ref 32.0–36.0)
MCV: 97 fL (ref 80–100)
PLATELETS: 256 10*3/uL (ref 150–440)
RBC: 4.4 10*6/uL (ref 3.80–5.20)
RDW: 13.9 % (ref 11.5–14.5)
WBC: 7.7 10*3/uL (ref 3.6–11.0)

## 2014-10-31 LAB — HCG, QUANTITATIVE, PREGNANCY

## 2014-10-31 LAB — ETHANOL

## 2015-04-17 NOTE — Consult Note (Signed)
Brief Consult Note: Diagnosis: Mood disorder NOS.   Patient was seen by consultant.   Consult note dictated.   Recommend further assessment or treatment.   Orders entered.   Comments: [name] has a h/o schizoaffectiove disorder and substance abuse. She wa s brought to the hosptal agitated and suicidal in the context of family conflict and coacine abuse. She is cool and collected. She is no longer suicidal or homicidal. Gaynelle AduSher is able to CFS. She decl\ines substance abuse treatment. She agrees to injections of Invega sustenna to improve compliance.    PLAN: 1. The patient no longer meets criteria for IVC. I will terminate proceeding. Please discharge as appropriate.  2. Will give first dose of 234 mg of Invega sustenna. She will be given the second ionjection of 156 mg in 5-8 days. She is to continue oral Risperdal as prescribed by her ACT team until then. No Rx necessary.  3. She will folow up with her [facility] and Drt. Lateef.  4. ACT team or her mother can pick her up from. ER.  Electronic Signatures: Kristine LineaPucilowska, Verleen Stuckey (MD)  (Signed 12-Dec-13 12:12)  Authored: Brief Consult Note   Last Updated: 12-Dec-13 12:12 by Kristine LineaPucilowska, Declan Adamson (MD)

## 2015-04-17 NOTE — Consult Note (Signed)
PATIENT NAME:  [name], [name] MR#:  [id number] DATE OF BIRTH:  1991/07/04  DATE OF CONSULTATION:  12/09/2012  REFERRING PHYSICIAN:  Caleen Jobs. Brien Mates, MD    CONSULTING PHYSICIAN:  Larsen Zettel B. Graiden Henes, MD  REASON FOR CONSULTATION: To evaluate a suicidal patient.   IDENTIFYING DATA: Bridget Carpenter is a 24 year old female with history of schizoaffective disorder and substance abuse.   CHIEF COMPLAINT: "I'm fine now."   HISTORY OF PRESENT ILLNESS: Bridget Carpenter has a long history of mental illness with multiple prior hospitalizations. She was brought to the hospital by police. We have different accounts of what happened prior. The patient claims that she was caught having sex with a boyfriend by her mother. The mother does not like the man, so she called her ACT Team. The patient refused to talk to ACT Team, and they called the police. We know from Waldo from [employer] that he was also involved and that the patient has been making suicidal threats.  Indeed, she has a series of superficial small cuts on her forearm. The patient adamantly denies feeling suicidal. She believe that conflict with the mother over a boyfriend led to her hospitalization. On admission she was also positive for cocaine. She denies any symptoms of depression, anxiety, or psychosis. She denies alcohol or prescription pills use. She does admit to smoking crack. The patient has been in the care of [facility]. They do not have a good relationship. I was surprised to hear that when the patient asked to be on injectable antipsychotic, Dr. Cherylann Ratel felt that it would be better for her to take oral medications. She has been treated with Risperdal in the past and currently should be taking 2 mg of Risperdal twice daily. She believes that medication is helpful. She also admits that when using drugs she oftentimes forgets the medication.   PAST PSYCHIATRIC HISTORY: She has been hospitalized several times at [facility], [facility], [facility] and  at [facility]. She was diagnosed with schizoaffective disorder and psychotic disorder in the past. During her hospitalization at [facility], her diagnoses included mood disorder, not otherwise specified, PTSD and ADD; and she was given Adderall as only treatment at discharge in 2010. The patient has a history of multiple suicide attempts and apparently has been chronically suicidal. Reportedly there is some history of violence, but we were never able to find any evidence of that; and the patient has been usually cooperative and pleasant in the Emergency Room. She was in the Emergency Room of [facility] on December 2nd following a drug binge and being confused and agitated. She was discharged to home then.   FAMILY PSYCHIATRIC HISTORY: None reported.   PAST MEDICAL HISTORY: Eczema.   ALLERGIES: No known drug allergies.   MEDICATIONS ON ADMISSION: Risperdal 2 mg twice daily, Clobetasol 0.05% cream twice daily, triamcinolone 0.1% ointment twice daily, Vistaril 50 mg twice daily.   SOCIAL HISTORY: She lives with her mother. She has a 67-year-old daughter who reportedly is the product of incest when the patient was 15. The patient has Medicaid.   REVIEW OF SYSTEMS: CONSTITUTIONAL: No fevers or chills. No weight changes. EYES: No double or blurred vision. ENT: No hearing loss. RESPIRATORY: No shortness of breath or cough. CARDIOVASCULAR: No chest pain or orthopnea. GASTROINTESTINAL: No abdominal pain, nausea, vomiting, or diarrhea. GU: No incontinence or frequency. ENDOCRINE: No heat or cold intolerance. LYMPHATIC: No anemia or easy bruising. INTEGUMENTARY:  Positive for eczema on  her arms and superficial scratches. MUSCULOSKELETAL: No muscle or joint pain. NEUROLOGIC: No tingling or weakness. PSYCHIATRIC: See history of present illness for details.   PHYSICAL EXAMINATION:  VITAL SIGNS: Blood pressure 101/56, pulse 88, respirations 18,  temperature 98.5.   GENERAL: This is a slender young female in no acute distress. The rest of the physical examination is deferred to her primary attending.   LABORATORY DATA: Chemistries are within normal limits. Blood alcohol level is zero. LFTs within normal limits. TSH 2.69. Urine tox screen positive for cocaine. CBC within normal limits except for white blood count of 13.2. Urinalysis 1+ leukocyte esterase. Urine pregnancy test is negative. Serum acetaminophen and salicylates are low.   MENTAL STATUS EXAMINATION: The patient is alert and oriented to person, place, time, and situation. She is pleasant, polite and cooperative. She recognizes me from our previous encounter 10 days or so ago. She is in hospital scrubs. She maintains good eye contact. Her speech is soft. Her mood is good with full affect. She is quite funny, appropriately so. Her thoughts are logical. There are no suicidal ideations. No psychotic symptoms. Her cognition is grossly intact. She registers 3 out of 3 and recalls 3 out of 3 objects after 5 minutes. She can spell world. She knows the current president. Her insight and judgment are questionable.   SUICIDE RISK ASSESSMENT: This is a patient with a long history of mental illness, mood instability, substance abuse and multiple suicide attempts in the past who appears to be chronically suicidal but is ready to accept medications and agrees to work with her [facility]. She is chronically at increased risk of suicide because of the nature of her illness and ongoing substance abuse.   DIAGNOSES:  AXIS I: Schizoaffective disorder, bipolar type; cocaine abuse.   AXIS II: Deferred.   AXIS III: Eczema.   AXIS IV: Mental illness, substance abuse.   AXIS V: Global Assessment of Functioning score 45.   PLAN:  1. The patient no longer meets criteria for involuntary inpatient psychiatric commitment. I will terminate proceedings. Please discharge as appropriate.  2. She is to  continue Risperdal 2 mg twice daily for another week. We will offer Invega Sustenna injection 234 mg today. The next dose of 156 mg will be given by her ACT Team within 5 to 8 days following discharge.  3. She will follow up with Dr. Cherylann RatelLateef of [facility].  4. She will be discharged to home. She has the keys to the house. She will either be picked up by her ACT Team or by her mother after work after 6:00 tonight.  ____________________________ Ellin GoodieJolanta B. Jennet MaduroPucilowska, MD jbp:cbb D: 12/09/2012 14:08:32 ET T: 12/09/2012 18:07:52 ET JOB#: [id number]  cc: Shreyansh Tiffany B. Jennet MaduroPucilowska, MD, <Dictator> Shari ProwsJOLANTA B Abigail Teall MD ELECTRONICALLY SIGNED 12/11/2012 2:18

## 2015-04-17 NOTE — Consult Note (Signed)
PATIENT NAME:  Bridget Carpenter, [name] MR#:  [id number] DATE OF BIRTH:  1991/11/15  DATE OF CONSULTATION:  11/27/2012  REFERRING PHYSICIAN:   CONSULTING PHYSICIAN:  Kareema Keitt K. Malaiah Viramontes, MD  SUBJECTIVE: The patient is a 24 year old African American female who is single who presented to the Emergency Room voluntarily with ideas of suicide and she reports she  drove herself to the ER. . She reports that Vistaril does not help and Vistaril makes her anxious and agitated and she wants to be detoxed from the same. According to information obtained from the chart, the patient had some crazy thoughts of strapping her legs to two different objects and making them pull her apart, putting bleach in her food, and running in front of traffic. In addition, she has past history of three suicide attempts by cutting and jumping out of moving motor vehicle. The patient was last seen when she came from the jail and she was stabilized and sent back to jail.   ALCOHOL AND DRUGS: The patient denies the same.   HOME MEDICATIONS: 1. Triamcinolone to be applied to affected areas.  2. Clobetasol 0.05% topical cream. 3. Clobex topical spray. 4. Vistaril 25 mg one capsule twice a day. 5. She was on Risperdal and Topamax in the past.   OBJECTIVE: The patient is disheveled in appearance. Alert and oriented. Speech is pressured. She is rambling. She jumps from one subject to the other. She is not able to focus and pay attention. She jumps from one topic to another and stated that she has a 24-year-old at home and so she has to take care and she wants to go and be detoxed from Vistaril because Vistaril makes her have crazy thoughts. She denies any suicidal thoughts, though according to information obtained she had made several suicide attempts in the past. Denies any voices or seeing things but gets agitated and upset and becomes argumentative very easily. She does admit to strange thoughts which are intrusive and she admits that she has  strange nightmares in which she thinks she's already dead and wakes up and is not sure if she is dead or not and continues to talk at this rate. Insight and judgment guarded versus impaired at this time.   IMPRESSION: Psychosis, not otherwise specified.  PLAN: Will start patient on Risperdal 2 mg p.o. b.i.d. and hopefully she will calm down enough and then further plans can be made to see if she can be transferred to inpatient unit or appropriate placement has to be considered.   ____________________________ Jannet MantisSurya K. Guss Bundehalla, MD skc:drc D: 11/27/2012 20:40:40 ET T: 11/28/2012 11:46:39 ET JOB#: [id number]  cc: Monika SalkSurya K. Guss Bundehalla, MD, <Dictator> Beau FannySURYA K Niam Nepomuceno MD ELECTRONICALLY SIGNED 11/28/2012 16:26

## 2015-04-17 NOTE — Consult Note (Signed)
Brief Consult Note: Diagnosis: Mood disorder NOS.   Patient was seen by consultant.   Consult note dictated.   Recommend further assessment or treatment.   Orders entered.   Comments: Bridget Carpenter has a h/o mood instability. She came to the hospital complaining of SE from Vistaril prescribed by her primary psychiatrist. She no longer feels "strange". She is cool aqnd collected. She tolerates Risperdal well.   PLAN: 1. The patient no longer meets criteria for IVC. I will terminate proceeding. Please discharge as appropriate.  2. She is to continue Risperdal as prescribed by her ACT team. No Rx necessary.  3. She will folow up with her ACT team.  4. She will need a taxi vaucher.  Electronic Signatures: Kristine LineaPucilowska, Abrahan Fulmore (MD)  (Signed 02-Dec-13 15:49)  Authored: Brief Consult Note   Last Updated: 02-Dec-13 15:49 by Kristine LineaPucilowska, Tashyra Adduci (MD)

## 2015-04-20 NOTE — Discharge Summary (Signed)
PATIENT NAME:  Th[place], [name] MR#:  [id number] DATE OF BIRTH:  [birth year]  DATE OF ADMISSION:  08/14/2013 DATE OF DISCHARGE:  08/22/2013  HOSPITAL COURSE: See dictated history and physical for details of admission. A 24 year old woman admitted to the hospital complaining of worsening mood symptoms, suicidal thoughts, negative thoughts about herself. Also admitted to be using cocaine and drinking. Also claimed to be very concerned about having trouble with attention and focus. In the hospital, the patient was treated with a combination of medication and individual and group psychotherapy. She did not engage in any dangerous or aggressive behavior in the hospital. She had difficulty tolerating medications. She became jittery and agitated with antidepressants, including fluoxetine and bupropion. The patient eventually requested to be taken off of all antidepressants. She stated that she felt better and was having fewer side effects off of them. By the time of discharge, she stated that she felt that her substance abuse and fatigue were probably primary issues. She preferred to stay off of medication. She was not displaying any psychotic symptoms. She was totally denying suicidal ideation. She had plans for follow-up in the community. The patient was only using a ProAir inhaler for asthma.  She was discharged home with follow up scheduled with the [facility].   DISCHARGE MEDICATIONS: ProAir inhaler on a p.r.n. basis for asthma.   LABORATORY RESULTS: Admission labs included drug screen positive for cocaine, TSH normal at 4.2. Alcohol undetected. Chemistry panel, otherwise unremarkable, CBC normal, urinalysis unremarkable. Pregnancy test negative.   MENTAL STATUS EXAM AT DISCHARGE:  Neatly dressed and groomed woman, looks her stated age. Calm, appropriate interaction. Good eye contact. Psychomotor activity normal. Speech normal rate, tone and volume. Affect: euthymic, reactive, appropriate. Mood stated as  being good. Thoughts appear to be logical without loosening of associations or delusions. Denies auditory or visual hallucinations. Denies suicidal or homicidal ideation. Shows improved judgment and insight. Agrees to outpatient treatment plan in the community. Normal intelligence.   DIAGNOSIS, PRINCIPAL AND PRIMARY:   AXIS I: Depression, not otherwise specified.   SECONDARY DIAGNOSES: AXIS I:  1.  Substance-induced mood disorder.  2.  Cocaine dependence.  3.  Alcohol abuse.   AXIS II: Borderline and antisocial traits.   AXIS III: Asthma.   AXIS IV: Severe, from multiple stresses, lack of support and resources.   AXIS V: Functioning at time of discharge, 55.    ____________________________ Audery AmelJohn T. Arminda Foglio, MD jtc:nts D: 09/02/2013 23:18:12 ET T: 09/03/2013 03:29:51 ET JOB#: [id number]  cc: Audery AmelJohn T. Harshika Mago, MD, <Dictator> Audery AmelJOHN T Dionte Blaustein MD ELECTRONICALLY SIGNED 09/05/2013 17:16

## 2015-04-20 NOTE — H&P (Signed)
PATIENT NAME:  Bridget Carpenter, [name] C MR#:  [id number] DATE OF BIRTH:  [birth year]  DATE OF ADMISSION:  08/14/2013 DATE OF EVALUATION:  08/15/2013  IDENTIFYING INFORMATION:  A 24 year old woman who brought herself to the Emergency Room.  CHIEF COMPLAINT: "I need help."   HISTORY OF PRESENT ILLNESS: Information obtained from the patient and the chart. The patient told me that she felt like recently her mood had been worse. She says that she feels depressed almost all of the time. She has been having more suicidal thoughts. She feels negative and hopeless about herself. Her energy is poor. Her sleep is poor. Additionally, she admits that she has been using cocaine several times a week and also drinking several times a week. The patient is very concerned that she should get some kind of medicine to "help with my attention and focus." She has apparently raised this issue with the ACT team in the community and is not satisfied with how they have responded. She thinks her current medicines have not been adequate. She is generally disgruntled with the ACT team and has multiple complaints about them. She says that her life in general feels quite stressful. She is not currently working. She has had dreams of going back to school but says she has not been able to do it because of her attention problems.   PAST PSYCHIATRIC HISTORY: The patient is with several prior psychiatric hospitalizations as well as multiple visits to the Emergency Room. She has an established diagnosis of polysubstance dependence with cocaine and alcohol being prominent. At times in the past, she has been variously diagnosed with attention deficit disorder, bipolar disorder and schizophrenia, none of which necessarily seem to be true. She totally denies having any psychotic symptoms. She is not reporting clear mood episodes typical of bipolar disorder. The patient complains multiple times to me of having problems with her attention and focus, but  right now she does not seem to be engaged in any kind of activities to test that. She says she does have a past history of suicide attempts by overdose. She has a history of prior treatment in the past with Adderall. She has also been treated with multiple antipsychotics and antidepressants. She thinks that perhaps the Prozac was helpful for her mood and possibly the Wellbutrin as well.   FAMILY HISTORY: Denies any.   SOCIAL HISTORY: The patient lives with her mother and 83-year-old daughter. She is not working, does not go to school, dropped out of the 9th grade. She says that she regrets that she continues to hang around with "the wrong sort of people" which leads her to substance abuse.   MEDICAL HISTORY: The patient has no known significant medical problems.  The patient reports that she has been told she had asthma in the past, but she does not think she has a significant problem with it. She says that she does not like taking inhalers, they make her feel too jittery, and that she does not have significant problems with wheezing.   SUBSTANCE ABUSE HISTORY: Has been abusing alcohol, cocaine and drugs for years, most recently using multiple times a week, feeling out of control with it.   REVIEW OF SYSTEMS: Depressed mood, complaints about concentration and focus, poor sleep, poor energy, hopelessness. Denies hallucinations.  Denies suicidal or homicidal ideation in the hospital, but has had some homicidal ideation at times at home recently.   MENTAL STATUS EXAM: Casually dressed, reasonably well-groomed woman who looks her stated age. Cooperative with  the interview. Good eye contact, normal psychomotor activity. Speech normal in rate, tone and volume. Affect is euthymic, a little bit blunted. Mood stated as depressed. Thoughts are generally lucid. No sign of loosening of associations or delusions. Denies auditory or visual hallucinations. Some suicidal ideation at home. Denies any intent in the  hospital. No homicidal ideation. Judgment and insight chronically somewhat impaired. Intelligence normal.   PHYSICAL EXAMINATION: GENERAL: No acute physical distress evident.  SKIN: No skin lesions identified.  HEENT: Pupils equal and reactive. Face symmetric. Oral mucosa normal. Full range of motion at all extremities and normal gait. Strength and reflexes normal and symmetric throughout. Cranial nerves symmetric and normal.  LUNGS: Clear with no wheezing.  HEART: Regular rate and rhythm.  ABDOMEN: Soft, nontender, normal bowel sounds.  CURRENT VITAL SIGNS: Show temperature 97.6, pulse 64, respirations 16, blood pressure 106/76.   ASSESSMENT: A 24 year old woman with history of mood instability but not clear-cut bipolar disorder. No signs of psychosis. Definitely has a long-standing substance abuse problem, most likely has personality disorder difficulties as well. Possible ADHD, but I am suspicious that there may be a hidden agenda for this, particularly given her history of substance dependence.   TREATMENT PLAN: Discussed options with the patient. Continue the bupropion for mood and continue restarting fluoxetine 20 mg for mood. Discontinue zolpidem, replace it with trazodone p.r.n. for sleep. Engage her in groups and activities on the unit. Psychoeducation and monitoring of mood and suicidal ideation. Work with the patient on arranging appropriate outpatient treatment.   DIAGNOSIS, PRINCIPAL AND PRIMARY:  AXIS I: Polysubstance dependence.   SECONDARY DIAGNOSES: AXIS I: Substance-induced mood disorder.   AXIS II: Borderline personality disorder.   AXIS III: Mild asthma.   AXIS IV: Severe from lack of income or support.   AXIS V: Functioning at time of evaluation 30.    ____________________________ Audery AmelJohn T. Clapacs, MD jtc:cb D: 08/15/2013 17:27:14 ET T: 08/15/2013 18:13:30 ET JOB#: [id number]  cc: Audery AmelJohn T. Clapacs, MD, <Dictator> Audery AmelJOHN T CLAPACS MD ELECTRONICALLY SIGNED 08/16/2013  11:14

## 2015-04-21 NOTE — Consult Note (Signed)
PATIENT NAME:  Bridget Carpenter, [name] MR#:  [id number] DATE OF BIRTH:  01-Jun-1991  SEX:  Female  RACE:  African-American  AGE:  24 years  DATE OF DICTATION: 02/11/2014  [facility], [place], [place]  DATE OF CONSULTATION:  02/11/2014  CONSULTING PHYSICIAN:  Viraaj Vorndran K. Mikhaela Zaugg, MD  SUBJECTIVE:  The patient was seen in consultation in the [facility].  The patient is a 24 year old African-American female with a long history of mental illness, and was diagnosed with dissociative identity disorder and schizoaffective disorder. The patient was never married, never employed, and lives with her mother along with her 24-year-old daughter. The patient comes to emergency room [facility] with the chief complaint "My personality of a 24-year-old came out and started talking various things, and they did not understand. I was handcuffed and brought here."   MENTAL STATUS EXAMINATION:  The patient is alert and oriented. Aware of the situation that brought her [facility] to [facility]. Denies feeling depressed. Denies feeling hopeless or helpless. Currently she reports that she is stable. She reports that because of DID, she has all sorts of personalities coming out, and today a 24-year-old came out and started talking, and people could not understand her, so they brought her here. Denies auditory or visual hallucinations. Denies hearing  voices. Denies paranoia or suspicious ideas. Denies any ideas or plans to hurt herself or others. Insight and judgment are fair, and she contracts for safety, and she is eager to go home                                                                                               to be with her daughter and mother.  IMPRESSION:  Dissociative identity disorder, schizoaffective disorder with psychosis.  RECOMMEND:  Discontinue IVC and discharge patient back to her mother and child. The patient reports that she was being followed by [facility]  by Dr. Cherylann RatelLateef, and currently Dr. Cherylann RatelLateef is going to refer to a different agency where she will get appropriate help for dissociative identity disorder, and has an appointment coming up with them on Tuesday 02/14/2014.     ____________________________ Jannet MantisSurya K. Guss Bundehalla, MD skc:mr D: 02/11/2014 20:10:47 ET T: 02/11/2014 20:33:11 ET JOB#: [id number]  cc: Monika SalkSurya K. Guss Bundehalla, MD, <Dictator> Beau FannySURYA K Aydien Majette MD ELECTRONICALLY SIGNED 02/11/2014 21:08

## 2015-04-21 NOTE — Discharge Summary (Signed)
PATIENT NAME:  Bridget Carpenter, [name] C MR#:  [id number] DATE OF BIRTH:  1991-01-07  DATE OF ADMISSION:  08/13/2014 DATE OF DISCHARGE:  08/17/2014  REASON FOR ADMISSION: Psychosis.   DISCHARGE DIAGNOSES:  AXIS I: Cocaine-induced psychotic disorder, cocaine use disorder, severe, alcohol use disorder, cannabis use disorder.  AXIS II: Deferred.   AXIS III: Severe eczema with secondary impetigo.   AXIS IV: History of legal charges.  AXIS V: Global assessment of functioning of 45.   DISCHARGE MEDICATIONS: Cephalexin 500 mg every 1 tablet every 12 hours for impetigo to complete 7 days, triamcinolone 0.05% topical ointment applied topically to affected area twice a day for severe eczema, hydroxyzine 50 mg p.o. at bedtime for itching.   HOSPITAL COURSE: A 24 year old African American female unemployed, who voiced paranoia in the Emergency Department. Per admission note, the patient was in the Emergency Department talking about having an uncle from [place] that was coming to the area to hurt her and her mother in order to collect the money from her mother's life insurance policy. The patient was admitted to [facility] behavioral [facility] unit. The patient has been hospitalized in our unit before on different occasions. Her main diagnosis has ranged from different disorders, and looks like over the last couple of admissions the patient was only given a diagnosis of depressive disorder, NOS, along with cocaine dependence. Collateral information was obtained from the patient's ACT Team [facility], who reported that they are actually in the process of discharging the patient from their services as they do not feel she qualifies for ACT services. They question the diagnosis of a primary psychotic disorder. They feel that the psychotic symptoms that the patient has shown and reported in the past are mainly secondary to issues with substance abuse. They have reported the patient has a significant issue with alcohol and has had  multiple legal charges as well. During this admission, the patient was positive for cocaine at admission. She was admitted with a working diagnosis of cocaine-induced psychotic disorder. She was calm and did not show any signs of agitation or aggression. She was not started on any antipsychotics. During her physical examination, it was noted that the patient had severe eczema that was generalized. For this reason, dermatology was consulted. They recommended for the patient Keflex 500 mg b.i.d. for 7 days and triamcinolone 0.1%. They gave her a diagnosis of severe atopic dermatitis with secondary impetigo. The patient was uncooperative doing the first 2 days of her hospitalization, mainly staying in her room, only leaving for meals. During the third day of admission, the patient was somewhat more cooperative. She reported still thinking that her uncle from [place] was coming down with these guys to try to hurt her and her mother; however, she reported not being in distress about him and not fearing for her safety or her mother's safety. The patient again was calm and cooperative with nursing. There was no unusual behavior during her stay. At the time of the discharge, the patient's delusional beliefs had cleared. The patient had reported on the day of the discharge that she was no longer knowing why she was thinking her uncle was coming down. This clarified the diagnosis and support. They believe that the patient's psychotic symptoms are secondary to the use of cocaine and other substances. At the time of the discharge the patient was much improved. She denied a depressed mood, suicidality, homicidality, or psychosis. She denied problems with appetite, energy, or concentration. She denied any problems with  topical agents or the medications ordered by dermatology.   Social worker contacted the patient's mother who reported that the patient is not psychotic at baseline and does not have any fixed delusions. The  patient has a small child; however, the patient's mother is the one that has full custody of the child. There was no need for seclusion,  restraints, or forced medication during this hospitalization.   MENTAL STATUS EXAMINATION AT THE TIME OF THE DISCHARGE: The patient was alert and oriented to person, place, time, and situation. She was cooperative. Psychomotor activity was within normal range. Eye contact was within normal range. Speech had regular tone, volume, and rate. Thought process was linear and goal-directed. Thought content was negative for suicidality or homicidality. Perception was negative for psychosis. Mood euthymic. Affect reactive. Insight and judgment limited.   LABORATORY RESULTS: BUN 11, creatinine 1.23, sodium 144, potassium 3.2. Alcohol level at admission was below detection limit. AST 29, ALT 14. Toxicology screen was positive for cannabis and for cocaine. WBC was 9.8, hemoglobin 14.3, hematocrit 43.7, and platelets 263,000. Urine toxicology screen was negative. Pregnancy test was negative.    DISCHARGE DISPOSITION: The patient will be discharged back to her mother's house. Social worker contacted the patient's mother who agreed with picking up the patient from the hospital.   DISCHARGE FOLLOWUP: The patient will follow up with intensive outpatient substance abuse, [facility].    ____________________________ Jimmy FootmanAndrea Hernandez-Gonzalez, MD ahg:at D: 08/17/2014 12:33:03 ET T: 08/17/2014 16:49:16 ET JOB#: [id number]  cc: Jimmy FootmanAndrea Hernandez-Gonzalez, MD, <Dictator> Horton ChinANDREA HERNANDEZ GONZAL MD ELECTRONICALLY SIGNED 08/18/2014 11:58

## 2015-04-21 NOTE — Consult Note (Signed)
Brief Consult Note: Diagnosis: depression nos.   Patient was seen by consultant.   Consult note dictated.   Discussed with Attending MD.   Comments: PSychiatry: PAtient with some symptoms of depressed mood and anxiety. Now denies that she has any plan or intent to kill self. Pt is lucid without psychosis. Has a safe place to stay. Not under IVC. Patient will be discharged from ER and can follow up with [facility].  Electronic Signatures: Audery Amellapacs, Laylia Mui T (MD)  (Signed 07-Jan-15 15:21)  Authored: Brief Consult Note   Last Updated: 07-Jan-15 15:21 by Audery Amellapacs, Lashina Milles T (MD)

## 2015-04-21 NOTE — Consult Note (Signed)
PATIENT NAME:  Bridget Carpenter, [name] C MR#:  [id number] DATE OF BIRTH:  05-03-1991  DATE OF CONSULTATION:  01/04/2014  CONSULTING PHYSICIAN:  Audery Amel, MD  IDENTIFYING INFORMATION AND REASON FOR CONSULT: A 24 year old woman who presented voluntarily to the Emergency Room claiming that she was having suicidal ideation. Consultation for evaluation and treatment.   HISTORY OF PRESENT ILLNESS: Information obtained from the patient and the chart. The patient is complaining of several different vague symptoms. She describes herself as being anxious, depressed and having trouble focusing. She says she has had some suicidal ideation, but cannot identify any specific plan. When pressed she says that maybe she would swallow some pills. She denies she is having any psychotic symptoms. She denies that she is abusing drugs or alcohol recently. She says that she feels like she is nervous and has trouble with her mood and cannot get through the day and cannot do the things she wants to do. She complained several times that the ACT team who works with her has been no help to her and she does not think that they listen to her. They have continued to prescribe Wellbutrin for her which she does not think is helpful. The patient cannot describe any particular recent stress or big change in her life that may have made things worse.   PAST PSYCHIATRIC HISTORY: She had a previous admission to our hospital this summer. At that time she also was vague with her symptoms and seemed to often be med seeking for particular medications that did not have a clear indication. She was not able to tolerate antidepressants and ultimately was discharged on no psychiatric medicine. After telling us that she felt that her substance abuse was probably her major problem. She has been followed up by [facility] ACT team. We do not have a clear history of actual suicide attempt in the past. No history of violence.   SOCIAL HISTORY: The patient has a  78-year-old daughter. The patient lives with her daughter as well as her mother, an aunt and a cousin. 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 She has a strong attachment to her daughter and feels that is a very positive reason to live. She is followed by the ACT team.   PAST MEDICAL HISTORY: The patient has no significant ongoing medical problems.   SUBSTANCE ABUSE HISTORY: When we saw her previously, she had been abusing cocaine and alcohol. Recently she says she is not drinking or using drugs and her drug screen is negative.   FAMILY HISTORY: None identified.   REVIEW OF SYSTEMS: Complains variously of anxiety, poor focus, depressed mood, poor motivation. Denies any psychotic symptoms. At one point, she says she was suicidal but later in the conversation, very clearly said that she had no intention or plan of killing herself, and she felt certain that she would not act on it to herself when she left the hospital because of her plans for the future and her attachment to her daughter.   MENTAL STATUS EXAMINATION: Slightly disheveled woman, looks her stated age. Passively cooperative with the interview. Eye contact poor. Psychomotor activity sluggish. Speech normal rate, tone and volume. Affect is slightly irritated. Mood is stated as being depressed. Thoughts are lucid without any loosening of associations or signs of delirium or delusions. Denies auditory or visual hallucinations. Says she describes herself as suicidal but does not have any specific plan to hurt herself and  feels sure that she would not hurt herself outside the hospital. No homicidal ideation. Judgment and insight, some chronic impairment. Intelligence average. Alert and oriented x 4.   LABORATORY RESULTS: TSH normal at 2.64. CBC normal. Alcohol negative. Chemistry panel unremarkable. Pregnancy test negative. Drug screen negative.   ASSESSMENT: This is a 24 year old  woman who has a past history of substance abuse and personality disorder primarily who came into the hospital with somewhat vague symptoms. She is most adamant in describing her dislike of her current ACT team because they do not listen to her and do not give her the medicine she wants. She had initially stated that she was suicidal but once I made it clear that we did not have beds available and she would need to stay in the Emergency Room if we were to seek alternative disposition, she quickly stated that she had no suicidal intent or plan and felt very safe in terms of suicidality with going home. At this point, the patient appears unlikely to benefit from inpatient hospitalization.   TREATMENT PLAN: The patient is encouraged very strongly to follow up with her ACT team as they are the designated providers already available for her in the community. She is to discuss her symptoms with her outpatient psychiatrist and therapy providers. Continue any medication she is currently taking although it is not clear that she is taking anything right now. The patient is not on involuntary commitment and can be discharged from the Emergency Room.   DIAGNOSIS, PRINCIPAL AND PRIMARY:  AXIS I: Depression, not otherwise specified.   SECONDARY DIAGNOSES: AXIS I: Polysubstance dependence, in remission.  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  AXIS III: No diagnosis.  AXIS IV: Moderate chronic stress from being a single mother conflict with family, financial stress.  AXIS V: Functioning at time of evaluation 55.     ____________________________ Audery AmelJohn T. Gillian Kluever, MD jtc:dp D: 01/04/2014 15:28:38 ET T: 01/04/2014 16:42:30 ET JOB#: [id number]  cc: Audery AmelJohn T. Sunaina Ferrando, MD, <Dictator> Audery AmelJOHN T Jadae Steinke MD ELECTRONICALLY SIGNED 01/04/2014 23:33
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
## 2015-07-17 ENCOUNTER — Emergency Department
Admission: EM | Admit: 2015-07-17 | Discharge: 2015-07-19 | Disposition: A | Discharge status: Other Acute Inpatient Hospital | Payer: Medicaid Other | Attending: Emergency Medicine | Admitting: Emergency Medicine

## 2015-07-17 ENCOUNTER — Encounter: Payer: Self-pay | Admitting: Emergency Medicine

## 2015-07-17 DIAGNOSIS — Z720 Tobacco use: Secondary | ICD-10-CM | POA: Insufficient documentation

## 2015-07-17 DIAGNOSIS — F259 Schizoaffective disorder, unspecified: Secondary | ICD-10-CM | POA: Insufficient documentation

## 2015-07-17 DIAGNOSIS — E876 Hypokalemia: Secondary | ICD-10-CM | POA: Insufficient documentation

## 2015-07-17 DIAGNOSIS — F1410 Cocaine abuse, uncomplicated: Secondary | ICD-10-CM | POA: Diagnosis not present

## 2015-07-17 DIAGNOSIS — R443 Hallucinations, unspecified: Secondary | ICD-10-CM

## 2015-07-17 DIAGNOSIS — F22 Delusional disorders: Secondary | ICD-10-CM | POA: Diagnosis not present

## 2015-07-17 DIAGNOSIS — Z7951 Long term (current) use of inhaled steroids: Secondary | ICD-10-CM | POA: Insufficient documentation

## 2015-07-17 DIAGNOSIS — F329 Major depressive disorder, single episode, unspecified: Secondary | ICD-10-CM | POA: Insufficient documentation

## 2015-07-17 DIAGNOSIS — Z79899 Other long term (current) drug therapy: Secondary | ICD-10-CM | POA: Insufficient documentation

## 2015-07-17 DIAGNOSIS — F14151 Cocaine abuse with cocaine-induced psychotic disorder with hallucinations: Secondary | ICD-10-CM | POA: Diagnosis not present

## 2015-07-17 HISTORY — DX: Schizoaffective disorder, unspecified: F25.9

## 2015-07-17 LAB — CBC
HCT: 36.9 % (ref 35.0–47.0)
Hemoglobin: 12.2 g/dL (ref 12.0–16.0)
MCH: 31.2 pg (ref 26.0–34.0)
MCHC: 33.1 g/dL (ref 32.0–36.0)
MCV: 94.3 fL (ref 80.0–100.0)
PLATELETS: 250 10*3/uL (ref 150–440)
RBC: 3.91 MIL/uL (ref 3.80–5.20)
RDW: 13.4 % (ref 11.5–14.5)
WBC: 13.2 10*3/uL — ABNORMAL HIGH (ref 3.6–11.0)

## 2015-07-17 LAB — SALICYLATE LEVEL: Salicylate Lvl: <4 mg/dL (ref 2.8–30.0)

## 2015-07-17 LAB — ETHANOL

## 2015-07-17 LAB — ACETAMINOPHEN LEVEL

## 2015-07-17 NOTE — ED Notes (Signed)

## 2015-07-17 NOTE — ED Notes (Signed)
MD made aware of critical K+ value. No new orders at this time.
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
## 2015-07-17 NOTE — ED Notes (Signed)
BEHAVIORAL HEALTH ROUNDING Patient sleeping: No. Patient alert and oriented: yes Behavior appropriate: Yes.  ; If no, describe:  Nutrition and fluids offered: Yes  Toileting and hygiene offered: Yes  Sitter present: yes Law enforcement present: Yes  

## 2015-07-17 NOTE — ED Notes (Signed)
Pt presents from home to ED with c/o feeling depressed and suicidal. States she has been using cocaine and does not want to use any more. States she keeps thinking everyone wants to kill her. Auditory and visual hallucinations. Hx of SI and suicide attempt. States she would probably overdose to kill herself. Pt has a flat affected and speaks softly. Appears paranoid.

## 2015-07-18 DIAGNOSIS — F14151 Cocaine abuse with cocaine-induced psychotic disorder with hallucinations: Secondary | ICD-10-CM | POA: Diagnosis not present

## 2015-07-18 LAB — URINE DRUG SCREEN, QUALITATIVE (ARMC ONLY)
Amphetamines, Ur Screen: NOT DETECTED
BENZODIAZEPINE, UR SCRN: NOT DETECTED
Barbiturates, Ur Screen: NOT DETECTED
CANNABINOID 50 NG, UR ~~LOC~~: NOT DETECTED
Cocaine Metabolite,Ur ~~LOC~~: POSITIVE — AB
MDMA (Ecstasy)Ur Screen: NOT DETECTED
Methadone Scn, Ur: NOT DETECTED
Opiate, Ur Screen: NOT DETECTED
PHENCYCLIDINE (PCP) UR S: NOT DETECTED
Tricyclic, Ur Screen: NOT DETECTED

## 2015-07-18 LAB — COMPREHENSIVE METABOLIC PANEL
ALBUMIN: 4.1 g/dL (ref 3.5–5.0)
ALT: 11 U/L — ABNORMAL LOW (ref 14–54)
AST: 20 U/L (ref 15–41)
Alkaline Phosphatase: 65 U/L (ref 38–126)
Anion gap: 7 (ref 5–15)
BILIRUBIN TOTAL: 1.2 mg/dL (ref 0.3–1.2)
BUN: 11 mg/dL (ref 6–20)
CALCIUM: 8.6 mg/dL — AB (ref 8.9–10.3)
CO2: 24 mmol/L (ref 22–32)
Chloride: 106 mmol/L (ref 101–111)
Creatinine, Ser: 0.91 mg/dL (ref 0.44–1.00)
GFR calc Af Amer: >60 mL/min (ref >60)
Glucose, Bld: 105 mg/dL — ABNORMAL HIGH (ref 65–99)
POTASSIUM: 2.8 mmol/L — AB (ref 3.5–5.1)
Sodium: 137 mmol/L (ref 135–145)
Total Protein: 7.2 g/dL (ref 6.5–8.1)

## 2015-07-18 LAB — GLUCOSE, CAPILLARY: Glucose-Capillary: 96 mg/dL (ref 65–99)

## 2015-07-18 MED ORDER — POTASSIUM CHLORIDE CRYS ER 20 MEQ PO TBCR
60.0000 meq | EXTENDED_RELEASE_TABLET | Freq: Once | ORAL | Status: AC
  Administered 2015-07-18: 60 meq via ORAL
  Filled 2015-07-18: qty 3

## 2015-07-18 NOTE — ED Notes (Signed)
Pt. Noted sleeping in room. No complaints or concerns voiced. No distress or abnormal behavior noted. Will continue to monitor with security cameras. Q 15 minute rounds continue. 

## 2015-07-18 NOTE — ED Notes (Signed)
BEHAVIORAL HEALTH ROUNDING Patient sleeping: No. Patient alert and oriented: yes Behavior appropriate: Yes.  ; If no, describe:  Nutrition and fluids offered: Yes  Toileting and hygiene offered: Yes  Sitter present: yes Law enforcement present: Yes  

## 2015-07-18 NOTE — ED Notes (Signed)
BEHAVIORAL HEALTH ROUNDING Patient sleeping: No. Patient alert and oriented: no Behavior appropriate: Yes.  ; If no, describe:  Nutrition and fluids offered: Yes  Toileting and hygiene offered: Yes  Sitter present: yes Law enforcement present: Yes ODS 

## 2015-07-18 NOTE — ED Notes (Signed)
APPEARANCE/BEHAVIOR calm and cooperative NEURO ASSESSMENT Orientation: person Hallucinations: Yes.  Auditory Hallucinations and Visual Hallucinations Speech: Normal Gait: normal RESPIRATORY ASSESSMENT Normal expansion.  Clear to auscultation.  No rales, rhonchi, or wheezing. CARDIOVASCULAR ASSESSMENT regular rate and rhythm, S1, S2 normal, no murmur, click, rub or gallop GASTROINTESTINAL ASSESSMENT soft, nontender, BS WNL, no r/g EXTREMITIES normal strength, tone, and muscle mass PLAN OF CARE Provide calm/safe environment. Vital signs assessed twice daily. ED BHU Assessment once each 12-hour shift. Collaborate with intake RN daily or as condition indicates. Assure the ED provider has rounded once each shift. Provide and encourage hygiene. Provide redirection as needed. Assess for escalating behavior; address immediately and inform ED provider.  Assess family dynamic and appropriateness for visitation as needed: Yes.  ; If necessary, describe findings:  Educate the patient/family about BHU procedures/visitation: Yes.  ; If necessary, describe findings:

## 2015-07-18 NOTE — ED Provider Notes (Signed)
-----------------------------------------   11:57 PM on 07/18/2015 -----------------------------------------  Patient has been admitted to the behavioral health unit. Currently resting in no acute distress.  Irean HongJade J Sung, MD 07/18/15 [phone]

## 2015-07-18 NOTE — ED Notes (Signed)
BEHAVIORAL HEALTH ROUNDING Patient sleeping: No. Patient alert and oriented: yes Behavior appropriate: Yes.  ; If no, describe: Pt states auditory and visual hallucinations.  Nutrition and fluids offered: Yes  Toileting and hygiene offered: Yes  Sitter present: yes Law enforcement present: Yes ODS

## 2015-07-18 NOTE — BHH Counselor (Signed)
Patient is to be admitted to [facility]. However, they are unable to transfer to the unit due to problems with staff to patient ratio. Writer updated ER Staff, Raquel; Charge Nurse, Dr  Schaevtiz, ER MD & Beth, Patient's Nurse. 

## 2015-07-18 NOTE — ED Notes (Signed)
BEHAVIORAL HEALTH ROUNDING Patient sleeping: Yes.   Patient alert and oriented: not applicable Behavior appropriate: Yes.  ; If no, describe:  Nutrition and fluids offered: Yes  Toileting and hygiene offered: Yes  Sitter present: yes Law enforcement present: Yes ODS 
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
## 2015-07-18 NOTE — ED Notes (Signed)
BEHAVIORAL HEALTH ROUNDING Patient sleeping: No. Patient alert and oriented: yes Behavior appropriate: Yes.   Nutrition and fluids offered: Yes  Toileting and hygiene offered: Yes  Sitter present: q15 min observations and security camera monitoring Law enforcement present: Yes [employer]  ENVIRONMENTAL ASSESSMENT Potentially harmful objects out of patient reach: Yes.   Personal belongings secured: Yes.   Patient dressed in hospital provided attire only: Yes.   Plastic bags out of patient reach: Yes.   Patient care equipment removed: Yes.   Equipment and supplies removed: Yes.   Potentially toxic materials out of patient reach: Yes.   Sharps container removed or out of patient reach: Yes.  ED BHU PLACEMENT JUSTIFICATION Is the patient under IVC or is there intent for IVC: Yes.    Is IVC current? Yes Is the patient medically cleared: Yes.   Is there vacancy in the ED BHU: Yes.   Is the population mix appropriate for patient: Yes.   Is the patient awaiting placement in inpatient or outpatient setting: Yes.   Has the patient had a psychiatric consult: Yes.   Survey of unit performed for contraband, proper placement and condition of furniture, tampering with fixtures in bathroom, shower, and each patient room: Yes.  ; Findings: none APPEARANCE/BEHAVIOR Cooperative,calm NEURO ASSESSMENT Orientation: A&Ox4 Hallucinations: none noted at this time Speech: Normal Gait: normal RESPIRATORY ASSESSMENT Breathing Pattern-regular, no respiratory distress noted CARDIOVASCULAR ASSESSMENT Skin color appropriate for age and race GASTROINTESTINAL ASSESSMENT no GI distress noted EXTREMITIES Moves all extremities, no distress noted PLAN OF CARE Provide calm/safe environment. Vital signs assessed twice daily. ED BHU Assessment once each 12-hour shift. Collaborate with intake RN daily or as condition indicates. Assure the ED provider has rounded once each shift. Provide and encourage hygiene.  Provide redirection as needed. Assess for escalating behavior; address immediately and inform ED provider.  Assess family dynamic and appropriateness for visitation as needed: Yes.  Educate the patient/family about BHU procedures/visitation: Yes.

## 2015-07-18 NOTE — ED Notes (Signed)

## 2015-07-18 NOTE — ED Notes (Addendum)
Patient given afternoon snack consisting of graham crackers, peanut butter, and drink. 

## 2015-07-18 NOTE — Consult Note (Signed)
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 Nasal discomfort [J34.89] 10/31/2011  . Decreased hearing [H91.90] 10/31/2011  . Asthma [493] 09/23/2011  . Contraception management [Z30.9] 09/23/2011  . Weight loss [R63.4] 09/23/2011  . TOBACCO USER [Z72.0] 09/15/2009  . OPPOSITIONAL DEFIANT DISORDER [F91.3] 11/21/2008  . HEADACHE, CHRONIC [R51] 05/18/2008  . 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 DEPRESSION [F32.9] 02/15/2008  . ATTENTION DEFICIT HYPERACTIVITY DISORDER [F90.9] 01/13/2008  . CHILD SEXUAL ABUSE [T74.22XA] 01/13/2008    Total Time spent with patient: 1 hour  Subjective:   Bridget Carpenter is a 24 y.o. female patient admitted with "I've been seeing and hearing things" area patient claims to be having hallucinations and to develop suicidal thoughts..  HPI:  Information from the patient and the chart. She came voluntarily to the emergency room stating that she's been having hallucinations. She claims they have been going on for months. She doesn't have a really clear specific reason why she decided to come to the hospital today but suggests that it was because she told a friend about her problems and they insisted she come to the hospital. She does say that for the past 2 days she's been having thoughts of killing herself. The specific means that she has planned out. She says she is not sleeping well. Not eating well. Patient admits that she continues to smoke crack cocaine on a regular basis but doesn't see the connection between  that and her psychiatric symptoms. She is not currently getting any outpatient mental health treatment.  Past psychiatric history: Long history of behavior problems dating back to childhood. Multiple childhood diagnoses including ADHD and oppositional defiant disorder. As an adult she has presented with similar symptoms to this in the past. Last time she was in our hospital the psychosis was not treated with anti-psychotics and resolved on its own once she got over her cocaine intoxication. Unclear whether she's really ever been compliant with any medications in the past for psychiatric illness. She has 1 prior suicide attempt she claims by overdose.  Social history: Currently stays with her mother. Patient has a 7-year-old daughter as well. The patient is not working. Seems to have a pretty empty life.  Medical history: Chronic eczema. No other significant ongoing medical problems.  Family history: Positive for substance abuse in several family members including both of her biological parents.  Current medications triamcinolone cream for her eczema and Keflex recently prescribed for her eczema.  Substance abuse history: Long history of abuse of cocaine. Has used heroin and other narcotics in the past most recently just smoking crack. Doesn't sound like she ever follows up with any outpatient substance abuse treatment especially not recently. HPI Elements:   Quality:  Auditory and visual hallucinations and suicidal ideation.. Severity:  Potentially life threatening. Timing:  Present for months worse in the last couple days. Duration:  Ongoing. Context:  Ongoing substance abuse minimal support no outpatient treatment.  Past Medical History:  Past Medical History  Diagnosis Date  . PTSD (post-traumatic stress disorder)   . ODD (oppositional defiant disorder)   . Substance abuse   . Depression 2009      Inpatient psych admission for SI, dissociative fugue  . Dissociative disorder or reaction  2009  . Asthma   . Eczema   . H/O: suicide attempt   . Schizoaffective disorder     Past Surgical History  Procedure Laterality Date  . Tonsillectomy     Family History:  Family History  Problem Relation Age of Onset  . Adopted: Yes   Social History:  History  Alcohol Use  . Yes     History  Drug Use  . Yes  . Special: Marijuana, Cocaine    History   Social History  . Marital Status: Single    Spouse Name: N/A  . Number of Children: N/A  . Years of Education: N/A   Social History Main Topics  . Smoking status: Current Some Day Smoker    Types: Cigarettes  . Smokeless tobacco: Never Used  . Alcohol Use: Yes  . Drug Use: Yes    Special: Marijuana, Cocaine  . Sexual Activity: Not Currently   Other Topics Concern  . None   Social History Narrative   Additional Social History:    History of alcohol / drug use?: Yes Negative Consequences of Use: Financial, Personal relationships Name of Substance 1: Cocaine 1 - Age of First Use: 18 1 - Amount (size/oz): "a lot" 1 - Frequency: almost daily 1 - Last Use / Amount: 07/17/2015                   Allergies:  No Known Allergies  Labs:  Results for orders placed or performed during the hospital encounter of 07/17/15 (from the past 48 hour(s))  Comprehensive metabolic panel     Status: Abnormal   Collection Time: 07/17/15 10:03 PM  Result Value Ref Range   Sodium 137 135 - 145 mmol/L   Potassium 2.8 (L) 3.5 - 5.1 mmol/L    Comment: CRITICAL RESULT CALLED TO, READ BACK BY AND VERIFIED WITH LUIS FLORES @ 2241 7.19.16 MPG RESULT REPEATED AND VERIFIED CORRECTED ON 07/20 AT 0854: PREVIOUSLY REPORTED AS 2.8 CRITICAL RESULT CALLED TO, READ BACK BY AND VERIFIED WITH LUIS FLORES @ 2241 7.19.16 MPG    Chloride 106 101 - 111 mmol/L   CO2 24 22 - 32 mmol/L   Glucose, Bld 105 (H) 65 - 99 mg/dL   BUN 11 6 - 20 mg/dL   Creatinine, Ser 0.91 0.44 - 1.00 mg/dL   Calcium 8.6 (L) 8.9 - 10.3 mg/dL   Total Protein 7.2  6.5 - 8.1 g/dL   Albumin 4.1 3.5 - 5.0 g/dL   AST 20 15 - 41 U/L   ALT 11 (L) 14 - 54 U/L   Alkaline Phosphatase 65 38 - 126 U/L   Total Bilirubin 1.2 0.3 - 1.2 mg/dL   GFR calc non Af Amer >60 >60 mL/min   GFR calc Af Amer >60 >60 mL/min    Comment: (NOTE) The eGFR has been calculated using the CKD EPI equation. This calculation has not been validated in all clinical situations. eGFR's persistently <60 mL/min signify possible Chronic Kidney Disease.    Anion gap 7 5 - 15  Ethanol (ETOH)     Status: None   Collection Time: 07/17/15 10:03 PM  Result Value Ref Range   Alcohol, Ethyl (B) <5 <5 mg/dL    Comment:        LOWEST DETECTABLE LIMIT FOR SERUM ALCOHOL IS 5 mg/dL FOR MEDICAL PURPOSES ONLY   Salicylate level     Status: None     Collection Time: 07/17/15 10:03 PM  Result Value Ref Range   Salicylate Lvl <4.0 2.8 - 30.0 mg/dL  Acetaminophen level     Status: Abnormal   Collection Time: 07/17/15 10:03 PM  Result Value Ref Range   Acetaminophen (Tylenol), Serum <10 (L) 10 - 30 ug/mL    Comment:        THERAPEUTIC CONCENTRATIONS VARY SIGNIFICANTLY. A RANGE OF 10-30 ug/mL MAY BE AN EFFECTIVE CONCENTRATION FOR MANY PATIENTS. HOWEVER, SOME ARE BEST TREATED AT CONCENTRATIONS OUTSIDE THIS RANGE. ACETAMINOPHEN CONCENTRATIONS >150 ug/mL AT 4 HOURS AFTER INGESTION AND >50 ug/mL AT 12 HOURS AFTER INGESTION ARE OFTEN ASSOCIATED WITH TOXIC REACTIONS.   CBC     Status: Abnormal   Collection Time: 07/17/15 10:03 PM  Result Value Ref Range   WBC 13.2 (H) 3.6 - 11.0 K/uL   RBC 3.91 3.80 - 5.20 MIL/uL   Hemoglobin 12.2 12.0 - 16.0 g/dL   HCT 36.9 35.0 - 47.0 %   MCV 94.3 80.0 - 100.0 fL   MCH 31.2 26.0 - 34.0 pg   MCHC 33.1 32.0 - 36.0 g/dL   RDW 13.4 11.5 - 14.5 %   Platelets 250 150 - 440 K/uL    Vitals: Blood pressure 97/48, pulse 74, temperature 98.9 F (37.2 C), temperature source Oral, resp. rate 18, height 5' 4" (1.626 m), weight 54.432 kg (120 lb), last menstrual  period 07/17/2015, SpO2 100 %.  Risk to Self: Suicidal Ideation: Yes-Currently Present Suicidal Intent: Yes-Currently Present Is patient at risk for suicide?: Yes Suicidal Plan?: Yes-Currently Present Specify Current Suicidal Plan: To take an overdose of sleeping pills Access to Means: Yes Specify Access to Suicidal Means: Patient has access to pills and pharmacies. What has been your use of drugs/alcohol within the last 12 months?: Use of cocaine almost daily. How many times?: 7 Other Self Harm Risks: burning and cutting Triggers for Past Attempts: None known Intentional Self Injurious Behavior: Cutting, Burning Risk to Others: Homicidal Ideation: No Thoughts of Harm to Others: No Current Homicidal Intent: No Current Homicidal Plan: No Access to Homicidal Means: No Describe Access to Homicidal Means: n/a Identified Victim: None reported History of harm to others?: No Assessment of Violence: None Noted Violent Behavior Description: None Does patient have access to weapons?: Yes (Comment) Criminal Charges Pending?: No Does patient have a court date: No Prior Inpatient Therapy: Prior Inpatient Therapy: Yes Prior Therapy Dates: 2012 Prior Therapy Facilty/Provider(s):  ([facility], [facility], [facility], [facility]) Reason for Treatment: psychosis Prior Outpatient Therapy: Prior Outpatient Therapy: Yes Prior Therapy Dates: Unsure Prior Therapy Facilty/Provider(s): Unsure Reason for Treatment: Psychosis Does patient have an ACCT team?: No Does patient have Intensive In-House Services?  : Yes Does patient have [facility] services? : No Does patient have [facility] services?: No  No current facility-administered medications for this encounter.   Current Outpatient Prescriptions  Medication Sig Dispense Refill  . albuterol (PROVENTIL HFA;VENTOLIN HFA) 108 (90 BASE) MCG/ACT inhaler Inhale 2 puffs into the lungs every 6 (six) hours as needed for wheezing or shortness of breath. 1 Inhaler 4  . fluticasone  (FLONASE) 50 MCG/ACT nasal spray Place 2 sprays into the nose daily. 16 g 1  . mometasone (ELOCON) 0.1 % cream Apply topically 2 (two) times daily. X 2 weeks     . risperiDONE (RISPERDAL) 1 MG tablet Take 1 mg by mouth 2 (two) times daily.        Musculoskeletal: Strength & Muscle Tone: within normal limits Gait & Station: normal Patient leans:   N/A  Psychiatric Specialty Exam: Physical Exam  Constitutional: She appears well-developed and well-nourished.  HENT:  Head: Normocephalic and atraumatic.  Eyes: Conjunctivae are normal. Pupils are equal, round, and reactive to light.  Neck: Normal range of motion.  Cardiovascular: Normal heart sounds.   Respiratory: Effort normal.  GI: Soft.  Musculoskeletal: Normal range of motion.  Neurological: She is alert.  Skin: Skin is warm and dry.  Psychiatric: Her affect is blunt. Her speech is delayed. She is slowed and withdrawn. Thought content is paranoid. Cognition and memory are impaired. She expresses impulsivity. She exhibits a depressed mood. She expresses suicidal ideation. She exhibits abnormal recent memory and abnormal remote memory.  Patient is alert but withdrawn. Claims to be having visual and auditory hallucinations and that her mood is depressed and that she is been having suicidal thoughts.    Review of Systems  Constitutional: Negative.   HENT: Negative.   Eyes: Negative.   Respiratory: Negative.   Cardiovascular: Negative.   Gastrointestinal: Negative.   Musculoskeletal: Negative.   Skin: Negative.   Neurological: Negative.   Psychiatric/Behavioral: Positive for depression, suicidal ideas, hallucinations, memory loss and substance abuse. The patient is nervous/anxious and has insomnia.     Blood pressure 97/48, pulse 74, temperature 98.9 F (37.2 C), temperature source Oral, resp. rate 18, height 5' 4" (1.626 m), weight 54.432 kg (120 lb), last menstrual period 07/17/2015, SpO2 100 %.Body mass index is 20.59 kg/(m^2).   General Appearance: Casual  Eye Contact::  Good  Speech:  Clear and Coherent and Slow  Volume:  Decreased  Mood:  Depressed  Affect:  Blunt  Thought Process:  Linear  Orientation:  Full (Time, Place, and Person)  Thought Content:  Hallucinations: Auditory Visual  Suicidal Thoughts:  Yes.  with intent/plan  Homicidal Thoughts:  No  Memory:  Immediate;   Fair Recent;   Poor Remote;   Poor  Judgement:  Impaired  Insight:  Lacking  Psychomotor Activity:  Decreased  Concentration:  Poor  Recall:  Poor  Fund of Knowledge:Poor  Language: Poor  Akathisia:  No  Handed:  Right  AIMS (if indicated):     Assets:  Social Support  ADL's:  Intact  Cognition: Impaired,  Mild  Sleep:      Medical Decision Making: Review of Psycho-Social Stressors (1), Review or order clinical lab tests (1), Established Problem, Worsening (2), Review or order medicine tests (1) and Review of Medication Regimen & Side Effects (2)  Treatment Plan Summary: Plan Patient with a history of cocaine abuse and cocaine-induced psychotic symptoms also has been diagnosed with dissociative disorder and PTSD in the past. Claims to be having auditory and visual hallucinations and to be having suicidal thoughts. Has not acted on any of these suicidal thoughts. She has not yet given [place] a urine drug screen but admits that she is using crack cocaine regularly. Due to her past history of suicidality and current symptoms probably needs hospitalization. For now I will continue the IVC. If we have a bed available we will try it made her to the psychiatry ward. Defer any anti-psychotics based on the situation she had last time. Plan discussed with the patient.  Plan:  Recommend psychiatric Inpatient admission when medically cleared. Supportive therapy provided about ongoing stressors. Disposition: Admit to psychiatry C noted above  Alethia Berthold 07/18/2015 3:25 PM

## 2015-07-18 NOTE — ED Notes (Signed)
BEHAVIORAL HEALTH ROUNDING Patient sleeping: Yes.   Patient alert and oriented: N/A Behavior appropriate: Yes.  ; If no, describe:  Nutrition and fluids offered: Yes  Toileting and hygiene offered: Yes  Sitter present: yes Law enforcement present: Yes [employer] 

## 2015-07-18 NOTE — ED Notes (Signed)
BEHAVIORAL HEALTH ROUNDING Patient sleeping: Yes.   Patient alert and oriented: not applicable Behavior appropriate: Yes.    Nutrition and fluids offered: No Toileting and hygiene offered: No Sitter present: q15 minute observations and security camera monitoring Law enforcement present: Yes [employer] 

## 2015-07-18 NOTE — ED Notes (Signed)
BEHAVIORAL HEALTH ROUNDING Patient sleeping: Yes.   Patient alert and oriented: not applicable Behavior appropriate: Yes.  ; If no, describe:  Nutrition and fluids offered: Yes  Toileting and hygiene offered: Yes  Sitter present: yes Law enforcement present: Yes  

## 2015-07-18 NOTE — ED Notes (Signed)
Pt. To BHU from ED ambulatory without difficulty, to [facility] 1 . [facility] Pt. Is alert and oriented, warm and dry in no distress. Pt. Denies SI, HI. Pt. C/o hearing voices telling her to hurt herself and seeing faces. Pt. Calm and cooperative. Pt. Made aware of security cameras and Q15 minute rounds. Pt. Encouraged to let Nursing staff know of any concerns or needs.

## 2015-07-19 ENCOUNTER — Other Ambulatory Visit: Payer: Self-pay

## 2015-07-19 ENCOUNTER — Inpatient Hospital Stay
Admission: EM | Admit: 2015-07-19 | Discharge: 2015-07-21 | DRG: 897 | Disposition: A | Discharge status: Home / Self Care | Payer: Medicaid Other | Source: Intra-hospital | Attending: Psychiatry | Admitting: Psychiatry

## 2015-07-19 DIAGNOSIS — J45909 Unspecified asthma, uncomplicated: Secondary | ICD-10-CM | POA: Diagnosis present

## 2015-07-19 DIAGNOSIS — Z9889 Other specified postprocedural states: Secondary | ICD-10-CM | POA: Diagnosis not present

## 2015-07-19 DIAGNOSIS — F259 Schizoaffective disorder, unspecified: Secondary | ICD-10-CM | POA: Diagnosis not present

## 2015-07-19 DIAGNOSIS — F14151 Cocaine abuse with cocaine-induced psychotic disorder with hallucinations: Secondary | ICD-10-CM | POA: Diagnosis present

## 2015-07-19 DIAGNOSIS — Z7951 Long term (current) use of inhaled steroids: Secondary | ICD-10-CM | POA: Diagnosis not present

## 2015-07-19 DIAGNOSIS — Z79899 Other long term (current) drug therapy: Secondary | ICD-10-CM

## 2015-07-19 DIAGNOSIS — F17210 Nicotine dependence, cigarettes, uncomplicated: Secondary | ICD-10-CM | POA: Diagnosis present

## 2015-07-19 DIAGNOSIS — Z62819 Personal history of unspecified abuse in childhood: Secondary | ICD-10-CM | POA: Diagnosis present

## 2015-07-19 LAB — COMPREHENSIVE METABOLIC PANEL
ALBUMIN: 3.7 g/dL (ref 3.5–5.0)
ALT: 10 U/L — ABNORMAL LOW (ref 14–54)
ANION GAP: 4 — AB (ref 5–15)
AST: 14 U/L — ABNORMAL LOW (ref 15–41)
Alkaline Phosphatase: 67 U/L (ref 38–126)
BILIRUBIN TOTAL: 0.4 mg/dL (ref 0.3–1.2)
BUN: 15 mg/dL (ref 6–20)
CALCIUM: 8.6 mg/dL — AB (ref 8.9–10.3)
CHLORIDE: 107 mmol/L (ref 101–111)
CO2: 25 mmol/L (ref 22–32)
CREATININE: 0.74 mg/dL (ref 0.44–1.00)
GFR calc non Af Amer: >60 mL/min (ref >60)
Glucose, Bld: 90 mg/dL (ref 65–99)
POTASSIUM: 4.1 mmol/L (ref 3.5–5.1)
Sodium: 136 mmol/L (ref 135–145)
TOTAL PROTEIN: 6.9 g/dL (ref 6.5–8.1)

## 2015-07-19 LAB — PREGNANCY, URINE: Preg Test, Ur: POSITIVE — AB

## 2015-07-19 MED ORDER — CLOBETASOL PROPIONATE 0.05 % EX CREA
TOPICAL_CREAM | Freq: Three times a day (TID) | CUTANEOUS | Status: DC
  Administered 2015-07-19: 1 via TOPICAL
  Administered 2015-07-19 – 2015-07-20 (×2): via TOPICAL
  Filled 2015-07-19: qty 15

## 2015-07-19 MED ORDER — TRAZODONE HCL 100 MG PO TABS
100.0000 mg | ORAL_TABLET | Freq: Every day | ORAL | Status: DC
  Administered 2015-07-19 – 2015-07-20 (×2): 100 mg via ORAL
  Filled 2015-07-19 (×2): qty 1

## 2015-07-19 MED ORDER — HYDROXYZINE HCL 50 MG PO TABS: 50.0000 mg | ORAL_TABLET | Freq: Three times a day (TID) | ORAL | Status: DC | PRN

## 2015-07-19 MED ORDER — VENLAFAXINE HCL ER 75 MG PO CP24
75.0000 mg | ORAL_CAPSULE | Freq: Every morning | ORAL | Status: DC
  Administered 2015-07-19 – 2015-07-21 (×3): 75 mg via ORAL
  Filled 2015-07-19 (×4): qty 1

## 2015-07-19 MED ORDER — TRIAMCINOLONE ACETONIDE 0.5 % EX OINT
TOPICAL_OINTMENT | Freq: Three times a day (TID) | CUTANEOUS | Status: DC
  Administered 2015-07-19: 15:00:00 via TOPICAL
  Administered 2015-07-19: 1 via TOPICAL
  Administered 2015-07-20: 16:00:00 via TOPICAL
  Filled 2015-07-19: qty 15

## 2015-07-19 MED ORDER — CEPHALEXIN 500 MG PO CAPS
500.0000 mg | ORAL_CAPSULE | Freq: Three times a day (TID) | ORAL | Status: DC
  Administered 2015-07-19 – 2015-07-21 (×6): 500 mg via ORAL
  Filled 2015-07-19 (×6): qty 1

## 2015-07-19 MED ORDER — CLOBETASOL PROPIONATE 0.05 % EX OINT
TOPICAL_OINTMENT | Freq: Three times a day (TID) | CUTANEOUS | Status: DC
  Filled 2015-07-19: qty 15

## 2015-07-19 MED ORDER — HALOPERIDOL 0.5 MG PO TABS
1.0000 mg | ORAL_TABLET | Freq: Two times a day (BID) | ORAL | Status: DC
  Administered 2015-07-19 – 2015-07-21 (×5): 1 mg via ORAL
  Filled 2015-07-19 (×5): qty 2

## 2015-07-19 NOTE — [facility] Group Notes (Signed)
[facility] Group Notes:  (Nursing/MHT/Case Management/Adjunct)  Date:  07/19/2015  Time:  4:42 PM  Type of Therapy:  Psychoeducational Skills  Participation Level:  Did Not Attend   Marquette Old 07/19/2015, 4:42 PM
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
## 2015-07-19 NOTE — Plan of Care (Signed)
Problem: Alteration in mood & ability to function due to Goal: LTG-Patient demonstrates decreased signs of withdrawal (Patient demonstrates decreased signs of withdrawal to the point the patient is safe to return home and continue treatment in an outpatient setting)  Outcome: Progressing Patient has not exhibited and signs of withdrawal; client remains safe

## 2015-07-19 NOTE — ED Notes (Signed)
Patient observed lying in bed with eyes closed  Even, unlabored respirations observed   NAD pt appears to be sleeping  I will continue to monitor along with every 15 minute visual observations and ongoing security camera monitoring    

## 2015-07-19 NOTE — ED Notes (Signed)
Breakfast provided   Pt sitting up in bed   Assessment completed  NAD  Continue to monitor

## 2015-07-19 NOTE — Tx Team (Signed)
Interdisciplinary Treatment Plan Update (Adult)  Date:  07/19/2015 Time Reviewed:  5:20 PM  Progress in Treatment: Attending groups: Yes. Participating in groups:  Yes. Taking medication as prescribed:  Yes. Tolerating medication:  Yes. Family/Significant othe contact made:  No, will contact:    Patient understands diagnosis:  Yes. Discussing patient identified problems/goals with staff:  Yes. Medical problems stabilized or resolved:  Yes. Denies suicidal/homicidal ideation: Yes. Issues/concerns per patient self-inventory:  Yes. Other:  New problem(s) identified: Yes, Describe:  substance use  Discharge Plan or Barriers: [facility]   Reason for Continuation of Hospitalization: Medication stabilization  Comments:  Estimated length of stay: 5 days  New goal(s):  Review of initial/current patient goals per problem list:   Refer to plan of care  Attendees: Patient:  Bridget Carpenter 7/21/20165:20 PM  Physician:  Dr  Guss Bunde  Nursing:   Jerry Caras RN  Case Manager:  Arrie Senate LCSW  Counselor:  Charleston Ropes LCSWA  Other:  Princella Ion LRT   7/21/20165:20 PM   7/21/20165:20 PM   7/21/20165:20 PM   7/21/20165:20 PM   7/21/20165:20 PM   7/21/20165:20 PM   7/21/20165:20 PM  Other:   7/21/20165:20 PM  Other:  7/21/20165:20 PM  Other:  7/21/20165:20 PM  Other:  7/21/20165:20 PM  Other:  7/21/20165:20 PM  Other:  7/21/20165:20 PM  Other:  7/21/20165:20 PM  Other:   7/21/20165:20 PM   Scribe for Treatment Team:   Cheron Schaumann, 07/19/2015, 5:20 PM

## 2015-07-19 NOTE — ED Notes (Signed)
BEHAVIORAL HEALTH ROUNDING Patient sleeping: No. Patient alert and oriented: yes Behavior appropriate: Yes.  ; If no, describe:  Nutrition and fluids offered: yes Toileting and hygiene offered: Yes  Sitter present: q15 minute observations and security camera monitoring Law enforcement present: Yes  ODS  

## 2015-07-19 NOTE — ED Notes (Signed)
BEHAVIORAL HEALTH ROUNDING Patient sleeping: Yes.   Patient alert and oriented: not applicable Behavior appropriate: Yes.    Nutrition and fluids offered: No Toileting and hygiene offered: No Sitter present: q15 minute observations and security camera monitoring Law enforcement present: Yes [employer] 

## 2015-07-19 NOTE — Progress Notes (Signed)
   07/19/15 1300  Clinical Encounter Type  Visited With Patient  Visit Type Spiritual support  Referral From Physician  Consult/Referral To Chaplain  Spiritual Encounters  Spiritual Needs Emotional  Stress Factors  Patient Stress Factors Health changes  Family Stress Factors None identified  Advance Directives (For Healthcare)  Does patient have an advance directive? No   Chaplain attempted to visit patient. Patient stated to nurse that she did not want to speak with chaplain.   AD [phone]

## 2015-07-19 NOTE — ED Notes (Signed)

## 2015-07-19 NOTE — Tx Team (Signed)
Initial Interdisciplinary Treatment Plan   PATIENT STRESSORS: Financial difficulties Health problems Medication change or noncompliance Substance abuse   PATIENT STRENGTHS: Ability for insight Motivation for treatment/growth   PROBLEM LIST: Problem List/Patient Goals Date to be addressed Date deferred Reason deferred Estimated date of resolution  Depression 07-19-2015     "Help with drug use" 07-19-2015                                                DISCHARGE CRITERIA:  Improved stabilization in mood, thinking, and/or behavior Verbal commitment to aftercare and medication compliance  PRELIMINARY DISCHARGE PLAN: Attend 12-step recovery group Outpatient therapy Return to previous living arrangement  PATIENT/FAMIILY INVOLVEMENT: This treatment plan has been presented to and reviewed with the patient, Bridget Carpenter, has been given the opportunity to ask questions and make suggestions.  Dwan Bolt 07/19/2015, 11:35 AM

## 2015-07-19 NOTE — Progress Notes (Signed)
Recreation Therapy Notes  Date: 07.21.16 Time: 3:00 pm Location: Craft Room  Group Topic: Coping Skills and Leisure Education  Goal Area(s) Addresses:  Patient will identify things they are grateful for. Patient will identify how being grateful can influence decision making.  Behavioral Response: Did not attend  Intervention: Grateful Wheel  Activity: Patients were given an "I Am Grateful For" worksheet and instructed to listed at least one thing they are grateful for under each category.  Education: LRT educated patients on how being aware of what they are grateful for affects their decision making skills.  Education Outcome: Patient did not attend group.  Clinical Observations/Feedback: Patient did not attend group.  Koven Belinsky M, LRT/CTRS 07/19/2015 4:16 PM 
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
## 2015-07-19 NOTE — ED Notes (Signed)
Pt not discharged to [facility] at this time per report from [facility] in  [facility]. AFter discussion with AC it was determined that pt needed to have EKG and CMP done prior to pt coming down to [facility] unit d/t pt reporting cocaine use prior to admission and low serum potassium level of 2.8 at admission on 07/17/15. Per request from Heron Sabins in [facility] unit EKG and CMP ordered. Pt admission to [facility] will be pushed back until later this am 07/19/15 after results of all tests are in.

## 2015-07-19 NOTE — ED Notes (Signed)
ED BHU PLACEMENT JUSTIFICATION Is the patient under IVC or is there intent for IVC: Yes.   Is the patient medically cleared: Yes.   Is there vacancy in the ED BHU: Yes.   Is the population mix appropriate for patient: Yes.   Is the patient awaiting placement in inpatient or outpatient setting: Yes.   Has the patient had a psychiatric consult: Yes.   Survey of unit performed for contraband, proper placement and condition of furniture, tampering with fixtures in bathroom, shower, and each patient room: Yes.  ; Findings:  APPEARANCE/BEHAVIOR Calm and cooperative NEURO ASSESSMENT Orientation: oriented x3  Denies pain Hallucinations: No.None noted (Hallucinations) Speech: Normal Gait: normal RESPIRATORY ASSESSMENT Even  Unlabored respirations  CARDIOVASCULAR ASSESSMENT Pulses equal   regular rate  Skin warm and dry   GASTROINTESTINAL ASSESSMENT no GI complaint EXTREMITIES Full ROM  PLAN OF CARE Provide calm/safe environment. Vital signs assessed twice daily. ED BHU Assessment once each 12-hour shift. Collaborate with intake RN daily or as condition indicates. Assure the ED provider has rounded once each shift. Provide and encourage hygiene. Provide redirection as needed. Assess for escalating behavior; address immediately and inform ED provider.  Assess family dynamic and appropriateness for visitation as needed: Yes.  ; If necessary, describe findings:  Educate the patient/family about BHU procedures/visitation: Yes.  ; If necessary, describe findings:   

## 2015-07-19 NOTE — BHH Counselor (Signed)
Authorization Request for Psych Inpt. Treatment from [employer] completed and submitted. [id number]#[id number].

## 2015-07-19 NOTE — Tx Team (Signed)
Initial Interdisciplinary Treatment Plan   PATIENT STRESSORS: Health problems Substance abuse   PATIENT STRENGTHS: Ability for insight Motivation for treatment/growth   PROBLEM LIST: Problem List/Patient Goals Date to be addressed Date deferred Reason deferred Estimated date of resolution                                                         DISCHARGE CRITERIA:  Improved stabilization in mood, thinking, and/or behavior Motivation to continue treatment in a less acute level of care Reduction of life-threatening or endangering symptoms to within safe limits  PRELIMINARY DISCHARGE PLAN: Outpatient therapy Return to previous living arrangement  PATIENT/FAMIILY INVOLVEMENT: This treatment plan has been presented to and reviewed with the patient, Bridget Carpenter, has been given the opportunity to ask questions and make suggestions.  Dwan Bolt 07/19/2015, 1:08 PM

## 2015-07-19 NOTE — ED Notes (Signed)
Report given to [facility] RN  LL BMU   1/1 bag of belongings and a large blue suitcase transferred with the pt

## 2015-07-19 NOTE — ED Notes (Signed)
BEHAVIORAL HEALTH ROUNDING Patient sleeping: No. Patient alert and oriented: yes Behavior appropriate: Yes.   Nutrition and fluids offered: Yes  Toileting and hygiene offered: Yes  Sitter present: q15 min observations and security camera monitoring Law enforcement present: Yes [employer] 

## 2015-07-20 ENCOUNTER — Encounter: Payer: Self-pay | Admitting: Psychiatry

## 2015-07-20 LAB — PREGNANCY, URINE: Preg Test, Ur: POSITIVE — AB

## 2015-07-20 MED ORDER — CEPHALEXIN 500 MG PO CAPS: 500.0000 mg | ORAL_CAPSULE | Freq: Three times a day (TID) | ORAL | Status: DC

## 2015-07-20 NOTE — Progress Notes (Signed)
Calm and cooperative. Pt remains paranoid and guarded. Endorses auditory/visual hallucinations. Med compliant. Urine obtained and sent to lab. Slept 7.45 hours. No c/o pain/discomfort noted

## 2015-07-20 NOTE — Progress Notes (Signed)
Patient is alert and oriented. Her mood is dysthymic with congruent affect. Denies SI or HI.  Currently denying hallucinations. No complaints of any withdrawal symptoms. Speech is soft, slow; thoughts are mostly organized and linear.  Patient made aware of pregnancy test results. She is talking with both her physician and social worker regarding further treatment options.  Patient has not attended any groups. She has been cooperative with other nursing interventions and taking her medications.  Plan - monitor mood, mental status. Encourage participation in milieu and groups. Maintain on 15 minute checks for safety.

## 2015-07-20 NOTE — BHH Group Notes (Signed)
BHH LCSW Group Therapy (late entry 07/19/2015)  07/20/2015 5:06 PM  Type of Therapy:  Group Therapy  Participation Level:  Did Not Attend  Modes of Intervention:  Discussion, Education, Socialization and Support  Summary of Progress/Problems: Emotional Regulation: Patients will identify both negative and positive emotions. They will discuss emotions they have difficulty regulating and how they impact their lives. Patients will be asked to identify healthy coping skills to combat unhealthy reactions to negative emotions.     Bridget Carpenter L Shirlee Whitmire 07/20/2015, 5:06 PM
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
## 2015-07-20 NOTE — BHH Group Notes (Signed)
[facility] LCSW Group Therapy  07/20/2015 3:46 PM  Type of Therapy:  Group Therapy  Participation Level:  Did Not Attend    Lulu Riding, MSW, LCSWA 07/20/2015, 3:46 PM

## 2015-07-20 NOTE — BHH Group Notes (Signed)
BHH Group Notes:  (Nursing/MHT/Case Management/Adjunct)  Date:  07/20/2015  Time:  6:12 AM  Type of Therapy:  Group Therapy  Participation Level:  Active  Participation Quality:  Appropriate  Affect:  Appropriate  Cognitive:  Appropriate  Insight:  Good  Engagement in Group:  Engaged  Modes of Intervention:  n/a  Summary of Progress/Problems:  Bridget Carpenter 07/20/2015, 6:12 AM

## 2015-07-20 NOTE — Progress Notes (Signed)
Recreation Therapy Notes  Date: 07.22.16 Time: 3:00 pm Location: Craft Room  Group Topic: [place]andalas  Goal Area(s) Addresses:  Patients will learn a healthy coping skill. Patients will verbalized benefit of coloring.  Behavioral Response: Did not attend  Intervention: [place]andalas  Activity: Patients colored a mandala to learn a new coping skill.  Education: LRT educated patients on the benefits of coloring.   Education Outcome: Patient did not attend group.  Clinical Observations/Feedback: Patient did not attend group.  Jacquelynn Cree, LRT/CTRS 07/20/2015 4:15 PM

## 2015-07-20 NOTE — BHH Counselor (Deleted)
Pt was discharged within 24 hours. CSW unable to complete assessment. Pt plans to return home and follow up at [facility] for substance abuse treatment and pregnancy services.   [place], MSW, [facility] 07/20/2015 3:45 PM

## 2015-07-20 NOTE — Progress Notes (Signed)
Recreation Therapy Notes  At approximately 1:10 pm, LRT attempted assessment. Patient was sleeping.  Jacquelynn Cree, LRT/CTRS 07/20/2015 1:36 PM

## 2015-07-20 NOTE — BHH Suicide Risk Assessment (Signed)
BHH INPATIENT:  Family/Significant Other Suicide Prevention Education  Suicide Prevention Education:  Education Completed; Rodney Booze (mother),  has been identified by the patient as the family member/significant other with whom the patient will be residing, and identified as the person(s) who will aid the patient in the event of a mental health crisis (suicidal ideations/suicide attempt).  With written consent from the patient, the family member/significant other has been provided the following suicide prevention education, prior to the and/or following the discharge of the patient.  The suicide prevention education provided includes the following:  Suicide risk factors  Suicide prevention and interventions  National Suicide Hotline telephone number  [facility] assessment telephone number  [facility] Emergency Assistance 911  [facility] and/or Residential Mobile Crisis Unit telephone number  Request made of family/significant other to:  Remove weapons (e.g., guns, rifles, knives), all items previously/currently identified as safety concern.    Remove drugs/medications (over-the-counter, prescriptions, illicit drugs), all items previously/currently identified as a safety concern.  The family member/significant other verbalizes understanding of the suicide prevention education information provided.  The family member/significant other agrees to remove the items of safety concern listed above.  Cassi Jenne L Jerzee Jerome MSW, LCSWA  07/20/2015, 1:30 PM

## 2015-07-20 NOTE — BHH Group Notes (Signed)
BHH Group Notes:  (Nursing/MHT/Case Management/Adjunct)  Date:  07/20/2015  Time:  11:47 AM  Type of Therapy:  Group Therapy  Participation Level:  Active  Participation Quality:  Appropriate and Attentive  Affect:  Appropriate  Cognitive:  Alert, Appropriate and Oriented  Insight:  Appropriate  Engagement in Group:  Engaged  Modes of Intervention:  Activity  Summary of Progress/Problems:  Bridget Carpenter 07/20/2015, 11:47 AM

## 2015-07-20 NOTE — Plan of Care (Signed)
Problem: Alteration in mood & ability to function due to Goal: LTG-Pt reports reduction in suicidal thoughts (Patient reports reduction in suicidal thoughts and is able to verbalize a safety plan for whenever patient is feeling suicidal)  Outcome: Not Progressing No injuries noted. No voiced thoughts of hurting herself. No c/o pain/discomfort noted.
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
## 2015-07-20 NOTE — BHH Group Notes (Signed)
[facility] LCSW Aftercare Discharge Planning Group Note   07/20/2015 5:07 PM  Participation Quality:  Did not attend  Adrijana Haros L Colson Barco MSW, LCSWA

## 2015-07-21 MED ORDER — MAGNESIUM HYDROXIDE 400 MG/5ML PO SUSP: 30.0000 mL | Freq: Every day | ORAL | Status: DC | PRN

## 2015-07-21 MED ORDER — FOLIC ACID 1 MG PO TABS: 1.0000 mg | ORAL_TABLET | Freq: Every day | ORAL | Status: DC

## 2015-07-21 MED ORDER — CEPHALEXIN 500 MG PO CAPS: 500.0000 mg | ORAL_CAPSULE | Freq: Three times a day (TID) | ORAL | Status: DC

## 2015-07-21 MED ORDER — ALUM & MAG HYDROXIDE-SIMETH 200-200-20 MG/5ML PO SUSP
30.0000 mL | ORAL | Status: DC | PRN
Start: 2015-07-21 — End: 2015-07-21

## 2015-07-21 MED ORDER — ACETAMINOPHEN 325 MG PO TABS: 650.0000 mg | ORAL_TABLET | Freq: Four times a day (QID) | ORAL | Status: DC | PRN

## 2015-07-21 MED ORDER — ALBUTEROL SULFATE HFA 108 (90 BASE) MCG/ACT IN AERS: 2.0000 | INHALATION_SPRAY | Freq: Four times a day (QID) | RESPIRATORY_TRACT | Status: DC | PRN

## 2015-07-21 NOTE — Progress Notes (Signed)
Spoke with Dr. Toni Amend to inform him of report from prior shift that the plan was to stop all meds, and start prenatal vitamins due to pregnancy. Reviewed current meds with Dr. Toni Amend, and was told to continue medications as ordered.

## 2015-07-21 NOTE — Progress Notes (Signed)
Patient ID: [name] C Keshishyan, female   DOB: 1991-04-23, 24 y.o.   MRN: [id number]   CSW was unable to complete assessment. Pt discharged within 48 hours. Pt plans to return home and follow up with [facility] for substance abuse treatment as well as prenatal care.   [place], MSW, [place]  07/21/2015 2:09 PM

## 2015-07-21 NOTE — Progress Notes (Signed)
  [facility] Adult Case Management Discharge Plan :  Will you be returning to the same living situation after discharge:  Yes,  Home with mom  At discharge, do you have transportation home?: Yes,  Friend  Do you have the ability to pay for your medications: Yes,  Insurance   Release of information consent forms completed and in the chart;  Patient's signature needed at discharge.  Patient to Follow up at: Follow-up Information    Call [facility]  .   Why:  You will need to call to complete a screening. They will inform you how long the wait will be. In the meantime, they will be able to connect you to their outpatient services including prenatal care and substance absue treatment.    Contact information:   [address] [place], [place] [address]  Phone: [phone]       Patient denies SI/HI: Yes,  Yes     Safety Planning and Suicide Prevention discussed: Yes,  With Mother   Have you used any form of tobacco in the last 30 days? (Cigarettes, Smokeless Tobacco, Cigars, and/or Pipes): Yes  Has patient been referred to the Quitline?: Patient refused referral  Rondall Allegra MSW, LCSWA  07/21/2015, 2:10 PM

## 2015-07-21 NOTE — Plan of Care (Signed)
Problem: Ineffective individual coping Goal: STG: Patient will remain free from self harm Outcome: Progressing Patient has not had any attempts to harm self or others.

## 2015-07-21 NOTE — Progress Notes (Signed)
Patient with appropriate affect and cooperative behavior with meals, meds and plan of care. NO SI/HI/AVH at this time. Good adls and appetite. Minimal interaction with peers, rests in bed between meals. Verbalizes needs appropriately with staff. MD into visit and patient to discharge today when friend arrives for transport. Safety maintained.

## 2015-07-21 NOTE — Progress Notes (Signed)
Patient isolative to her room most of the latter part of the evening. Cooperative with unit routine. Compliant with hs medications. Questioned if medications were safe to take during pregnancy. Informed patient of conversation with Dr. Toni Amend, concerning medications. Medications were administered as order. No inappropriate behaviors noted. Patient was noted to be fidgety, and noted scratching a lot. Refused eczema creams. No s/s of acute distress. Will cont to monitor and provide support.

## 2015-07-21 NOTE — Progress Notes (Signed)
Patient to discharge at this time with friend to transport in private car. Patient denies SI/HI/AVH at this time. Verbalizes understanding rt recommended discharge plan of care. Verbalizes confirmation that her belongings have been returned including her books that she left in her room and Tech returned. Safety maintained.

## 2015-07-21 NOTE — [facility] Group Notes (Signed)
[facility] Group Notes:  (Nursing/MHT/Case Management/Adjunct)  Date:  07/21/2015  Time:  3:13 AM  Type of Therapy:  Group Therapy  Participation Level:  Did Not Attend    Summary of Progress/Problems:  Sable Knoles Imani Verla Bryngelson 07/21/2015, 3:13 AM 

## 2015-07-23 NOTE — Progress Notes (Signed)
AVS H&P Discharge Summary faxed to [facility] for hospital follow-up

## 2015-07-25 ENCOUNTER — Emergency Department
Admission: EM | Admit: 2015-07-25 | Discharge: 2015-07-25 | Disposition: A | Discharge status: Home / Self Care | Payer: Medicaid Other | Attending: Emergency Medicine | Admitting: Emergency Medicine

## 2015-07-25 ENCOUNTER — Emergency Department: Payer: Medicaid Other

## 2015-07-25 DIAGNOSIS — O209 Hemorrhage in early pregnancy, unspecified: Secondary | ICD-10-CM | POA: Insufficient documentation

## 2015-07-25 DIAGNOSIS — Z79899 Other long term (current) drug therapy: Secondary | ICD-10-CM | POA: Insufficient documentation

## 2015-07-25 DIAGNOSIS — Z3A00 Weeks of gestation of pregnancy not specified: Secondary | ICD-10-CM | POA: Diagnosis not present

## 2015-07-25 DIAGNOSIS — O009 Unspecified ectopic pregnancy without intrauterine pregnancy: Secondary | ICD-10-CM

## 2015-07-25 DIAGNOSIS — F17210 Nicotine dependence, cigarettes, uncomplicated: Secondary | ICD-10-CM | POA: Insufficient documentation

## 2015-07-25 DIAGNOSIS — O99331 Smoking (tobacco) complicating pregnancy, first trimester: Secondary | ICD-10-CM | POA: Insufficient documentation

## 2015-07-25 LAB — CBC WITH DIFFERENTIAL/PLATELET
Basophils Absolute: 0 10*3/uL (ref 0–0.1)
Basophils Relative: 1 %
Eosinophils Absolute: 0.9 10*3/uL — ABNORMAL HIGH (ref 0–0.7)
Eosinophils Relative: 9 %
HCT: 37.3 % (ref 35.0–47.0)
Hemoglobin: 12.3 g/dL (ref 12.0–16.0)
Lymphocytes Relative: 22 %
Lymphs Abs: 2.3 10*3/uL (ref 1.0–3.6)
MCH: 31.2 pg (ref 26.0–34.0)
MCHC: 32.9 g/dL (ref 32.0–36.0)
MCV: 94.8 fL (ref 80.0–100.0)
MONOS PCT: 10 %
Monocytes Absolute: 1 10*3/uL — ABNORMAL HIGH (ref 0.2–0.9)
NEUTROS ABS: 6 10*3/uL (ref 1.4–6.5)
Neutrophils Relative %: 58 %
PLATELETS: 225 10*3/uL (ref 150–440)
RBC: 3.93 MIL/uL (ref 3.80–5.20)
RDW: 13.5 % (ref 11.5–14.5)
WBC: 10.2 10*3/uL (ref 3.6–11.0)

## 2015-07-25 LAB — WET PREP, GENITAL
CLUE CELLS WET PREP: NONE SEEN
TRICH WET PREP: NONE SEEN
YEAST WET PREP: NONE SEEN

## 2015-07-25 LAB — BASIC METABOLIC PANEL
Anion gap: 7 (ref 5–15)
BUN: 15 mg/dL (ref 6–20)
CHLORIDE: 107 mmol/L (ref 101–111)
CO2: 25 mmol/L (ref 22–32)
Calcium: 9 mg/dL (ref 8.9–10.3)
Creatinine, Ser: 0.97 mg/dL (ref 0.44–1.00)
Glucose, Bld: 80 mg/dL (ref 65–99)
POTASSIUM: 3.7 mmol/L (ref 3.5–5.1)
Sodium: 139 mmol/L (ref 135–145)

## 2015-07-25 LAB — CHLAMYDIA/NGC RT PCR (ARMC ONLY)
CHLAMYDIA TR: NOT DETECTED
N GONORRHOEAE: NOT DETECTED

## 2015-07-25 LAB — HCG, QUANTITATIVE, PREGNANCY: HCG, BETA CHAIN, QUANT, S: 3551 m[IU]/mL — AB (ref <5)

## 2015-07-25 MED ORDER — METHOTREXATE INJECTION FOR WOMEN'S HOSPITAL
50.0000 mg/m2 | Freq: Once | INTRAMUSCULAR | Status: AC
  Administered 2015-07-25: 85 mg via INTRAMUSCULAR
  Filled 2015-07-25: qty 1.7
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
## 2015-07-25 NOTE — Consult Note (Signed)
Consult History and Physical   SERVICE: Gynecology  Patient Name: Bridget Carpenter Patient MRN:   [id number]  CC: vaginal bleeding  HPI: Bridget Carpenter is a 24 y.o. G2P1001 who presented to [facility] ED with complaints of vaginal bleeding that has been present for about 9 days.  She was involuntarily hospitalized 8 days ago for behavioural health issues (cocaine psychosis), and discharged on her own accord.  At the time she thought she was on her period.  As protocol for this admission, she had a urine pregnancy test that was positive.  A blood test was not performed.  Since her discharge, she has continued to have bleeding, which she describes as moderate to heavy, but denies pain, lightheadedness, dizzyness, or nausea/vomiting.  When asked why today she decided to come to the ED, she said because she thought her bleeding would be done by now, and nothing else in particular led her to this decision.  Upon admission, a Beta HCG was obtained, which was 3550, and a subsequent ultrasound was performed that showed no IUP, but a structure in the right adnexa separate from the ovary that was characteristic of an extrauterine gestational sac.  No fetal pole or yolk sac was identified.  On imaging the doppler/vascular "ring of fire" was identified around this structure.   The patient at this time contracts for safety and denies SI or HI.     Review of Systems: positives in bold GEN:   fevers, chills, weight changes, appetite changes, fatigue, night sweats CV:   CP, palpitations PULM:  SOB, cough GI:  abd pain, N/V/D/C GU:  dysuria, urgency, frequency GYN:   vaginal bleeding, pelvic pain MSK:  arthralgias, myalgias, back pain, swelling SKIN:  rashes, color changes, pallor NEURO:  numbness, weakness, tingling, seizures, dizziness, tremors PSYCH:  depression, anxiety, behavioral problems, confusion  HEME/LYMPH:  easy bruising or bleeding ENDO:  heat/cold intolerance  Past Obstetrical History: OB  History    G2P1001 - 7y ago FTSVD      Past Gynecologic History: Patient's last menstrual period was 07/17/2015. She ordinarily has regular periods and does not use oral contraceptives   Past Medical History: Past Medical History  Diagnosis Date  . PTSD (post-traumatic stress disorder)   . ODD (oppositional defiant disorder)   . Substance abuse   . Depression 2009    Inpatient psych admission for SI, dissociative fugue  . Dissociative disorder or reaction 2009  . Asthma   . Eczema   . H/O: suicide attempt   . Schizoaffective disorder     Past Surgical History:   Past Surgical History  Procedure Laterality Date  . Tonsillectomy      Family History:  family history is not on file. She was adopted.  Social History:  History   Social History  . Marital Status: Single    Spouse Name: N/A  . Number of Children: N/A  . Years of Education: N/A   Occupational History  . Not on file.   Social History Main Topics  . Smoking status: Current Some Day Smoker -- 0.50 packs/day for 2 years    Types: Cigarettes  . Smokeless tobacco: Never Used  . Alcohol Use: Yes  . Drug Use: Yes    Special: Marijuana, "Crack" cocaine, Heroin  . Sexual Activity: Yes    Birth Control/ Protection: Condom   Other Topics Concern  . Not on file   Social History Narrative    Home Medications:  Medications reconciled in EPIC  No  current facility-administered medications on file prior to encounter.   Current Outpatient Prescriptions on File Prior to Encounter  Medication Sig Dispense Refill  . albuterol (PROVENTIL HFA;VENTOLIN HFA) 108 (90 BASE) MCG/ACT inhaler Inhale 2 puffs into the lungs every 6 (six) hours as needed for wheezing or shortness of breath. 1 Inhaler 2  . cephALEXin (KEFLEX) 500 MG capsule Take 1 capsule (500 mg total) by mouth 3 (three) times daily. 18 capsule 0  . cephALEXin (KEFLEX) 500 MG capsule Take 1 capsule (500 mg total) by mouth 3 (three) times daily. (Patient not  taking: Reported on 07/25/2015)    . folic acid (FOLVITE) 1 MG tablet Take 1 tablet (1 mg total) by mouth daily. (Patient not taking: Reported on 07/25/2015) 30 tablet 6    Allergies:  No Known Allergies  Physical Exam:  Temp:  [98.1 F (36.7 C)] 98.1 F (36.7 C) (07/27 1141) Pulse Rate:  [71-82] 82 (07/27 1730) Resp:  [16] 16 (07/27 1141) BP: (105-106)/(51-53) 105/51 mmHg (07/27 1730) SpO2:  [98 %-100 %] 100 % (07/27 1730) Weight:  [63.504 kg (140 lb)] 63.504 kg (140 lb) (07/27 1141)   General Appearance:  Well developed, well nourished, no acute distress, alert and oriented x3, tearful HEENT:  Normocephalic atraumatic, extraocular movements intact, moist mucous membranes Cardiovascular:  Normal S1/S2, regular rate and rhythm, no murmurs Pulmonary:  clear to auscultation, no wheezes, rales or rhonchi, symmetric air entry, good air exchange Abdomen:  Bowel sounds present, soft, nontender, nondistended, no abnormal masses, no epigastric pain Extremities:  Full range of motion, no pedal edema, 2+ distal pulses, no tenderness Skin:  normal coloration and turgor, no rashes, no suspicious skin lesions noted  Psychiatric:  Normal mood and affect, appropriate, no AH/VH Pelvic:  Deferred due to suspected ectopic pregnancy   Labs/Studies:   CBC and Coags:  Lab Results  Component Value Date   WBC 10.2 07/25/2015   NEUTOPHILPCT 58 07/25/2015   EOSPCT 9 07/25/2015   BASOPCT 1 07/25/2015   LYMPHOPCT 22 07/25/2015   HGB 12.3 07/25/2015   HCT 37.3 07/25/2015   MCV 94.8 07/25/2015   PLT 225 07/25/2015   CMP:  Lab Results  Component Value Date   NA 139 07/25/2015   K 3.7 07/25/2015   CL 107 07/25/2015   CO2 25 07/25/2015   BUN 15 07/25/2015   CREATININE 0.97 07/25/2015   CREATININE 0.74 07/19/2015   CREATININE 0.91 07/17/2015   PROT 6.9 07/19/2015   BILITOT 0.4 07/19/2015   ALT 10* 07/19/2015   AST 14* 07/19/2015   ALKPHOS 67 07/19/2015   Other Labs: Beta HCG: 3551  TVUS:    US Ob Comp Less 14 Wks  07/25/2015   CLINICAL DATA:  Vaginal bleeding.  Positive pregnancy test  EXAM: OBSTETRIC <14 WK [place] AND TRANSVAGINAL OB US  TECHNIQUE: Both transabdominal and transvaginal ultrasound examinations were performed for complete evaluation of the gestation as well as the maternal uterus, adnexal regions, and pelvic cul-de-sac. Transvaginal technique was performed to assess early pregnancy.  COMPARISON:  None.  FINDINGS: Intrauterine gestational sac: Not visualized  Yolk sac:  Not visualized  Embryo:  Not visualized  Cardiac Activity: Not visualized  Maternal uterus/adnexae: There is no demonstrable subchorionic hemorrhage. Uterus is anteverted. Uterus measures 6.0 x 4.3 x 4.4 cm. There is no intrauterine mass. Endometrium measures 6 mm in thickness with a smooth contour. Left ovary measures 3.2 x 1.4 x 2.7 cm. There is no left-sided pelvic mass. Right ovary measures 2.9 x  1.8 x 2.1 cm. Immediately adjacent to the right ovary, there is a solid structure containing a sac-like area. This structure measures 1.9 x 1.5 x 1.7 cm. A fetal pole is not seen within this structure. A small amount of free pelvic fluid is identified.  IMPRESSION: No intrauterine gestation is seen. There is a solid structure in the right adnexa with a cystic structure within this area. While not definitive, this finding is concerning for potential ectopic gestation. Small amount of nearby free fluid is noted. There is no left-sided mass or intrauterine mass. Given this circumstance, close clinical and serial beta HCG surveillance advised. Timing of repeat sonographic study will depend in part on clinical and beta HCG findings. Given the current sonographic appearance, gynecologic evaluation is felt to be warranted.  Critical Value/emergent results were called by telephone at the time of interpretation on 07/25/2015 at 3:49 pm to Dr. Mayford Knife, ED physician , who verbally acknowledged these results.   Electronically Signed    By: Bretta Bang III M.D.   On: 07/25/2015 15:51    Assessment / Plan:   Bridget Carpenter is a 24 y.o. G2P1001 who presents with ectopic pregnancy until proven otherwise.  1. I had a long detailed discussion with patient and her mother regarding the abnormal pregnancy, location, pathophysiology, and potential outcomes of this pregnancy - none of which are viable.  Since the beta is >3000, an IUP should be distinguishable, which it is not.  My suspicion is the structure seen in the adnexa is a developing ectopic pregnancy.  While watchful waiting is always an option, my concern is that a rupture is too great of a possibility to leave untreated. Also, if her beta continues to rise, she will be out of range for medical management.  I proposed both surgical intervention and medical intervention to the patient, with risks and benefits to each.  I contracted with the patient that she would return for blood draws on designated days, and follow up with me in the office for continued surveillance until her pregnancy is resolved.  She agreed, and thus I will administer Methotrexate,  as calculated /m2 per BSA.  She understand that she may need a second dose of this medication, and that even with administration, a rupture could still happen.  She was advised to return immediately if she felt strong rectal pain, pain in her right side that was sudden, sharp, or increasing in severity.  The possibility of death from hemorrhage was explained several times.  She will need to have the following: Repeat beta on Saturday 7/30 (Day 4) Repeat beta on Tuesday 8/2 (Day 7) --Both to be drawn at the outpatient lab at [facility]. Follow up appointment with me @ [facility] on Wednesday, 8/3.  These instructions, and a pamphlet regarding methotrexate administration and do's and dont's were given to the patient.     Thank you for the opportunity to be involved with this patient's care.   ----- Ranae Plumber,  MD Attending Obstetrician and Gynecologist [facility]

## 2015-07-25 NOTE — ED Notes (Signed)
Pt states she was here last week for mental health issues and was told then she had a positive pregnancy test, states she began having heavy bleeding like a normal period that started on mon-Tuesday.Marland Kitchen

## 2015-07-25 NOTE — Discharge Instructions (Signed)
As we discussed he will need a recheck of your beta hCG (pregnancy level) on both Saturday and next Tuesday. Please seek medical attention for any high fevers, chest pain, shortness of breath, change in behavior, persistent vomiting, bloody stool or any other new or concerning symptoms.  Methotrexate Treatment for an Ectopic Pregnancy Methotrexate is a medicine that treats ectopic pregnancy by stopping the growth of the fertilized egg. It also helps your body absorb tissue from the egg. This takes between 2 weeks and 6 weeks. Most ectopic pregnancies can be successfully treated with methotrexate if they are detected early enough. LET [facility] CARE PROVIDER KNOW ABOUT:  Any allergies you have.  All medicines you are taking, including vitamins, herbs, eye drops, creams, and over-the-counter medicines.  Medical conditions you have. RISKS AND COMPLICATIONS Generally, this is a safe treatment. However, as with any treatment, problems can occur. Possible problems or side effects include:  Nausea.  Vomiting.  Diarrhea.  Abdominal cramping.  Mouth sores.  Increased vaginal bleeding or spotting.   Swelling or irritation of the lining of your lungs (pneumonitis).  Failed treatment and continuation of the pregnancy.   Liver damage.  Hair loss. There is still a risk of the ectopic pregnancy rupturing while using the methotrexate. BEFORE THE PROCEDURE Before you take the medicine:   Liver tests, kidney tests, and a complete blood test are performed.  Blood tests are performed to measure the pregnancy hormone levels and to determine your blood type.  If you are Rh-negative and the father is Rh-positive or his Rh type is not known, you will be given a Rho (D) immune globulin shot. PROCEDURE  There are two methods that your health care provider may use to prescribe methotrexate. One method involves a single dose or injection of the medicine. Another method involves a series of doses  given through several injections.  AFTER THE PROCEDURE  You may have some abdominal cramping, vaginal bleeding, and fatigue in the first few days after taking methotrexate.  Blood tests will be taken for several weeks to check the pregnancy hormone levels. The blood tests are performed until there is no more pregnancy hormone detected in the blood. Document Released: 12/09/2001 Document Revised: 05/01/2014 Document Reviewed: 10/03/2013 [facility] Patient Information 2015 [place], [place]. This information is not intended to replace advice given to you by your health care provider. Make sure you discuss any questions you have with your health care provider.

## 2015-07-25 NOTE — ED Notes (Signed)
Pt in room w/ family eating lunch even though she was told she needed to be kept NPO

## 2015-07-26 LAB — ABO/RH: ABO/RH(D): A POS

## 2015-07-26 LAB — HIV ANTIBODY (ROUTINE TESTING W REFLEX): HIV Screen 4th Generation wRfx: NONREACTIVE

## 2015-07-31 ENCOUNTER — Encounter
Admission: RE | Admit: 2015-07-31 | Discharge: 2015-07-31 | Disposition: A | Discharge status: Home / Self Care | Payer: Medicaid Other | Source: Ambulatory Visit | Attending: Obstetrics & Gynecology | Admitting: Obstetrics & Gynecology

## 2015-07-31 DIAGNOSIS — O009 Ectopic pregnancy, unspecified: Secondary | ICD-10-CM | POA: Insufficient documentation

## 2015-07-31 LAB — HCG, QUANTITATIVE, PREGNANCY: hCG, Beta Chain, Quant, S: 2038 m[IU]/mL — ABNORMAL HIGH (ref <5)

## 2016-08-13 ENCOUNTER — Emergency Department
Admission: EM | Admit: 2016-08-13 | Discharge: 2016-08-13 | Disposition: A | Discharge status: Other Acute Inpatient Hospital | Payer: Medicaid Other | Attending: Student | Admitting: Student

## 2016-08-13 ENCOUNTER — Emergency Department: Payer: Medicaid Other

## 2016-08-13 ENCOUNTER — Inpatient Hospital Stay
Admission: EM | Admit: 2016-08-13 | Discharge: 2016-08-18 | DRG: 885 | Disposition: A | Discharge status: Home / Self Care | Payer: Medicaid Other | Source: Intra-hospital | Attending: Psychiatry | Admitting: Psychiatry

## 2016-08-13 DIAGNOSIS — Z79899 Other long term (current) drug therapy: Secondary | ICD-10-CM | POA: Diagnosis not present

## 2016-08-13 DIAGNOSIS — R45851 Suicidal ideations: Secondary | ICD-10-CM | POA: Diagnosis present

## 2016-08-13 DIAGNOSIS — F1410 Cocaine abuse, uncomplicated: Secondary | ICD-10-CM | POA: Diagnosis present

## 2016-08-13 DIAGNOSIS — Z9889 Other specified postprocedural states: Secondary | ICD-10-CM | POA: Diagnosis not present

## 2016-08-13 DIAGNOSIS — F323 Major depressive disorder, single episode, severe with psychotic features: Secondary | ICD-10-CM | POA: Diagnosis present

## 2016-08-13 DIAGNOSIS — J45909 Unspecified asthma, uncomplicated: Secondary | ICD-10-CM | POA: Insufficient documentation

## 2016-08-13 DIAGNOSIS — Z818 Family history of other mental and behavioral disorders: Secondary | ICD-10-CM | POA: Diagnosis not present

## 2016-08-13 DIAGNOSIS — G4700 Insomnia, unspecified: Secondary | ICD-10-CM | POA: Diagnosis present

## 2016-08-13 DIAGNOSIS — F2081 Schizophreniform disorder: Secondary | ICD-10-CM | POA: Diagnosis not present

## 2016-08-13 DIAGNOSIS — L309 Dermatitis, unspecified: Secondary | ICD-10-CM | POA: Diagnosis present

## 2016-08-13 DIAGNOSIS — F333 Major depressive disorder, recurrent, severe with psychotic symptoms: Secondary | ICD-10-CM | POA: Diagnosis not present

## 2016-08-13 DIAGNOSIS — Z716 Tobacco abuse counseling: Secondary | ICD-10-CM | POA: Diagnosis not present

## 2016-08-13 DIAGNOSIS — F17210 Nicotine dependence, cigarettes, uncomplicated: Secondary | ICD-10-CM | POA: Diagnosis not present

## 2016-08-13 DIAGNOSIS — Z9119 Patient's noncompliance with other medical treatment and regimen: Secondary | ICD-10-CM | POA: Diagnosis not present

## 2016-08-13 DIAGNOSIS — F17200 Nicotine dependence, unspecified, uncomplicated: Secondary | ICD-10-CM | POA: Diagnosis present

## 2016-08-13 DIAGNOSIS — Z9114 Patient's other noncompliance with medication regimen: Secondary | ICD-10-CM | POA: Diagnosis not present

## 2016-08-13 LAB — COMPREHENSIVE METABOLIC PANEL
ALBUMIN: 4.4 g/dL (ref 3.5–5.0)
ALK PHOS: 74 U/L (ref 38–126)
ALT: 12 U/L — ABNORMAL LOW (ref 14–54)
ANION GAP: 8 (ref 5–15)
AST: 34 U/L (ref 15–41)
BILIRUBIN TOTAL: 2.5 mg/dL — AB (ref 0.3–1.2)
BUN: 19 mg/dL (ref 6–20)
CALCIUM: 9 mg/dL (ref 8.9–10.3)
CO2: 23 mmol/L (ref 22–32)
CREATININE: 1.06 mg/dL — AB (ref 0.44–1.00)
Chloride: 108 mmol/L (ref 101–111)
GFR calc Af Amer: >60 mL/min (ref >60)
GFR calc non Af Amer: >60 mL/min (ref >60)
GLUCOSE: 93 mg/dL (ref 65–99)
Potassium: 3.8 mmol/L (ref 3.5–5.1)
Sodium: 139 mmol/L (ref 135–145)
Total Protein: 8 g/dL (ref 6.5–8.1)

## 2016-08-13 LAB — URINE DRUG SCREEN, QUALITATIVE (ARMC ONLY)
Amphetamines, Ur Screen: NOT DETECTED
BARBITURATES, UR SCREEN: NOT DETECTED
BENZODIAZEPINE, UR SCRN: NOT DETECTED
Cannabinoid 50 Ng, Ur ~~LOC~~: NOT DETECTED
Cocaine Metabolite,Ur ~~LOC~~: POSITIVE — AB
MDMA (Ecstasy)Ur Screen: NOT DETECTED
Methadone Scn, Ur: NOT DETECTED
OPIATE, UR SCREEN: NOT DETECTED
PHENCYCLIDINE (PCP) UR S: NOT DETECTED
Tricyclic, Ur Screen: NOT DETECTED

## 2016-08-13 LAB — CBC
HEMATOCRIT: 41.1 % (ref 35.0–47.0)
Hemoglobin: 14 g/dL (ref 12.0–16.0)
MCH: 31.5 pg (ref 26.0–34.0)
MCHC: 33.9 g/dL (ref 32.0–36.0)
MCV: 92.7 fL (ref 80.0–100.0)
Platelets: 253 10*3/uL (ref 150–440)
RBC: 4.43 MIL/uL (ref 3.80–5.20)
RDW: 13.5 % (ref 11.5–14.5)
WBC: 7 10*3/uL (ref 3.6–11.0)

## 2016-08-13 LAB — ACETAMINOPHEN LEVEL

## 2016-08-13 LAB — BILIRUBIN, DIRECT: Bilirubin, Direct: 0.2 mg/dL (ref 0.1–0.5)

## 2016-08-13 LAB — SALICYLATE LEVEL: Salicylate Lvl: <4 mg/dL (ref 2.8–30.0)

## 2016-08-13 LAB — ETHANOL: Alcohol, Ethyl (B): <5 mg/dL (ref <5)

## 2016-08-13 MED ORDER — MAGNESIUM HYDROXIDE 400 MG/5ML PO SUSP: 30.0000 mL | Freq: Every day | ORAL | Status: DC | PRN

## 2016-08-13 MED ORDER — ALBUTEROL SULFATE HFA 108 (90 BASE) MCG/ACT IN AERS
2.0000 | INHALATION_SPRAY | RESPIRATORY_TRACT | Status: DC | PRN
  Filled 2016-08-13: qty 6.7

## 2016-08-13 MED ORDER — CLINDAMYCIN HCL 150 MG PO CAPS
300.0000 mg | ORAL_CAPSULE | Freq: Three times a day (TID) | ORAL | Status: DC
  Administered 2016-08-13: 300 mg via ORAL
  Filled 2016-08-13: qty 2

## 2016-08-13 MED ORDER — RISPERIDONE 1 MG PO TABS
2.0000 mg | ORAL_TABLET | Freq: Two times a day (BID) | ORAL | Status: DC
  Administered 2016-08-13: 2 mg via ORAL
  Filled 2016-08-13: qty 2

## 2016-08-13 MED ORDER — ALBUTEROL SULFATE HFA 108 (90 BASE) MCG/ACT IN AERS
2.0000 | INHALATION_SPRAY | RESPIRATORY_TRACT | Status: DC | PRN
Start: 2016-08-13 — End: 2016-08-13
  Filled 2016-08-13: qty 6.7

## 2016-08-13 MED ORDER — ALUM & MAG HYDROXIDE-SIMETH 200-200-20 MG/5ML PO SUSP: 30.0000 mL | ORAL | Status: DC | PRN

## 2016-08-13 MED ORDER — ACETAMINOPHEN 325 MG PO TABS: 650.0000 mg | ORAL_TABLET | Freq: Four times a day (QID) | ORAL | Status: DC | PRN

## 2016-08-13 MED ORDER — RISPERIDONE 1 MG PO TABS
2.0000 mg | ORAL_TABLET | Freq: Two times a day (BID) | ORAL | Status: DC
  Administered 2016-08-14 – 2016-08-18 (×6): 2 mg via ORAL
  Filled 2016-08-13 (×10): qty 2

## 2016-08-13 MED ORDER — CLINDAMYCIN HCL 150 MG PO CAPS
300.0000 mg | ORAL_CAPSULE | Freq: Three times a day (TID) | ORAL | Status: AC
  Administered 2016-08-13 – 2016-08-17 (×14): 300 mg via ORAL
  Filled 2016-08-13: qty 1
  Filled 2016-08-13 (×4): qty 2
  Filled 2016-08-13: qty 1
  Filled 2016-08-13 (×2): qty 2
  Filled 2016-08-13 (×2): qty 1
  Filled 2016-08-13 (×5): qty 2
  Filled 2016-08-13: qty 1
  Filled 2016-08-13 (×2): qty 2

## 2016-08-13 NOTE — Tx Team (Signed)
Initial Interdisciplinary Treatment Plan   PATIENT STRESSORS: Medication change or noncompliance Occupational concerns Substance abuse   PATIENT STRENGTHS: Average or above average intelligence Communication skills Supportive family/friends   PROBLEM LIST: Problem List/Patient Goals Date to be addressed Date deferred Reason deferred Estimated date of resolution  Suicidal ideations 08/13/2016     Major depression 08/13/2016     Substance abuse 08/13/2016                                          DISCHARGE CRITERIA:  Adequate post-discharge living arrangements Medical problems require only outpatient monitoring  PRELIMINARY DISCHARGE PLAN: Attend aftercare/continuing care group Return to previous living arrangement  PATIENT/FAMIILY INVOLVEMENT: This treatment plan has been presented to and reviewed with the patient, Bridget Carpenter, and/or family member,  The patient and family have been given the opportunity to ask questions and make suggestions.  Bridget Carpenter 08/13/2016, 6:22 PM

## 2016-08-13 NOTE — Consult Note (Signed)
[facility] Consult   Reason for Consult:  Consult for this 25 year old woman with a history of psychosis and substance abuse Referring Physician:  Cinda Quest Patient Identification: Bridget Carpenter MRN:  [id number] Principal Diagnosis: Schizophreniform attack [facility]) Diagnosis:   Patient Active Problem List   Diagnosis Date Noted  . 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 Cocaine abuse with cocaine-induced mood disorder ([place]) [F14.14] 07/21/2015  . 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 Nasal discomfort [J34.89] 10/31/2011  . Decreased hearing [H91.90] 10/31/2011  . Asthma [J45.909] 09/23/2011  . Contraception management [Z30.9] 09/23/2011  . Weight loss [R63.4] 09/23/2011  . TOBACCO USER [F17.200] 09/15/2009  . OPPOSITIONAL DEFIANT DISORDER [F91.3] 11/21/2008  . HEADACHE, CHRONIC [R51] 05/18/2008  . 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 Recurrent major depression-severe ([place]) [F33.2] 02/15/2008  . ATTENTION DEFICIT HYPERACTIVITY DISORDER [F90.9] 01/13/2008  . CHILD SEXUAL ABUSE [T74.22XA] 01/13/2008    Total Time spent with patient: 1 hour  Subjective:   Bridget C Sanjurjo is a 25 y.o. female patient admitted with "I'm having suicidal thoughts".  HPI:  Patient interviewed. Chart reviewed. Labs and vitals reviewed. 25 year old woman presented to the emergency room saying she was having suicidal thoughts. They've been bad for the last couple days. She claims that yesterday she tried to kill her self by walking out into traffic but was disappointed that cars stopped rather than hitting her. She says that her mood is been bad anxious and depressed. Sleeping very poorly at night. She also says she's having auditory hallucinations that occur both day and night. The hallucinations sometimes tell her to do things but not to kill her self. Patient admits that she used some crack cocaine and alcohol yesterday.  She said she did this to try and make herself feel more numb when she killed herself. She denies that she is drinking or using drugs regularly. She is not currently taking any psychiatric medicine.  Social history: Patient lives with her mother and her 87-year-old daughter. She is not working outside the home. Doesn't sound like she has much daily activity  Medical history: History of mild asthma. Otherwise she has had some sexually transmitted diseases and says that she has an infection now but otherwise no ongoing medical problems.  Substance abuse history: History of cocaine abuse. Previous presentations have been thought to be related to substance abuse is the primary cause of her psychosis.  Past Psychiatric History: Patient has had previous psychiatric hospitalizations most recently last year about a year ago. Has a history of substance abuse mostly crack cocaine. He was treated with risperidone the past but never followed up or took it after staying in the hospital.  Risk to Self: Suicidal Ideation: Yes-Currently Present Suicidal Intent: Yes-Currently Present Is patient at risk for suicide?: Yes Specify Current Suicidal Plan: Multiple, OD, walking into traffic, cut herself  Access to Means: Yes What has been your use of drugs/alcohol within the last 12 months?: Hx of Drug and alcohol use states sober x1 year  How many times?:  (Unknown) Other Self Harm Risks: Cutter Triggers for Past Attempts: Hallucinations, Unpredictable Intentional Self Injurious Behavior: Cutting Comment - Self Injurious Behavior: Cutting wrists and arms in the past  Risk to Others: Homicidal Ideation: No Thoughts of Harm to Others: No Current Homicidal Intent: No Current Homicidal Plan: No Access to Homicidal Means: No Identified Victim: N/A History of harm to others?: No Assessment of Violence: None Noted Violent Behavior Description: Unknown Does patient have  access to weapons?: No Criminal Charges  Pending?: No Does patient have a court date: No Prior Inpatient Therapy: Prior Inpatient Therapy: Yes Prior Therapy Dates: 07/2013 Prior Therapy Facilty/Provider(s): [facility] Reason for Treatment: SI Prior Outpatient Therapy: Prior Outpatient Therapy: No Prior Therapy Dates: None Reported  Prior Therapy Facilty/Provider(s): None Reported  Reason for Treatment: None Reported  Does patient have an ACCT team?: No Does patient have Intensive In-House Services?  : No Does patient have [facility] services? : No Does patient have P4CC services?: No  Past Medical History:  Past Medical History:  Diagnosis Date  . Asthma   . Depression 2009   Inpatient psych admission for SI, dissociative fugue  . Dissociative disorder or reaction 2009  . Eczema   . H/O: suicide attempt   . ODD (oppositional defiant disorder)   . PTSD (post-traumatic stress disorder)   . Schizoaffective disorder ([place])   . Substance abuse     Past Surgical History:  Procedure Laterality Date  . TONSILLECTOMY     Family History:  Family History  Problem Relation Age of Onset  . Adopted: Yes   Family Psychiatric  History: She thinks her biological mother had mood problems but she is not entirely sure. Social History:  History  Alcohol Use  . Yes     History  Drug Use  . Types: Marijuana, "Crack" cocaine, Heroin    Social History   Social History  . Marital status: Single    Spouse name: N/A  . Number of children: N/A  . Years of education: N/A   Social History Main Topics  . Smoking status: Current Some Day Smoker    Packs/day: 0.50    Years: 2.00    Types: Cigarettes  . Smokeless tobacco: Never Used  . Alcohol use Yes  . Drug use:     Types: Marijuana, "Crack" cocaine, Heroin  . Sexual activity: Yes    Birth control/ protection: Condom   Other Topics Concern  . None   Social History Narrative  . None   Additional Social History:    Allergies:   Allergies  Allergen Reactions  . Banana Hives     Labs:  Results for orders placed or performed during the hospital encounter of 08/13/16 (from the past 48 hour(s))  Comprehensive metabolic panel     Status: Abnormal   Collection Time: 08/13/16 10:26 AM  Result Value Ref Range   Sodium 139 135 - 145 mmol/L   Potassium 3.8 3.5 - 5.1 mmol/L   Chloride 108 101 - 111 mmol/L   CO2 23 22 - 32 mmol/L   Glucose, Bld 93 65 - 99 mg/dL   BUN 19 6 - 20 mg/dL   Creatinine, Ser 1.06 (H) 0.44 - 1.00 mg/dL   Calcium 9.0 8.9 - 10.3 mg/dL   Total Protein 8.0 6.5 - 8.1 g/dL   Albumin 4.4 3.5 - 5.0 g/dL   AST 34 15 - 41 U/L   ALT 12 (L) 14 - 54 U/L   Alkaline Phosphatase 74 38 - 126 U/L   Total Bilirubin 2.5 (H) 0.3 - 1.2 mg/dL   GFR calc non Af Amer >60 >60 mL/min   GFR calc Af Amer >60 >60 mL/min    Comment: (NOTE) The eGFR has been calculated using the CKD EPI equation. This calculation has not been validated in all clinical situations. eGFR's persistently <60 mL/min signify possible Chronic Kidney Disease.    Anion gap 8 5 - 15  Ethanol  Status: None   Collection Time: 08/13/16 10:26 AM  Result Value Ref Range   Alcohol, Ethyl (B) <5 <5 mg/dL    Comment:        LOWEST DETECTABLE LIMIT FOR SERUM ALCOHOL IS 5 mg/dL FOR MEDICAL PURPOSES ONLY   Salicylate level     Status: None   Collection Time: 08/13/16 10:26 AM  Result Value Ref Range   Salicylate Lvl <4.0 2.8 - 30.0 mg/dL  Acetaminophen level     Status: Abnormal   Collection Time: 08/13/16 10:26 AM  Result Value Ref Range   Acetaminophen (Tylenol), Serum <10 (L) 10 - 30 ug/mL    Comment:        THERAPEUTIC CONCENTRATIONS VARY SIGNIFICANTLY. A RANGE OF 10-30 ug/mL MAY BE AN EFFECTIVE CONCENTRATION FOR MANY PATIENTS. HOWEVER, SOME ARE BEST TREATED AT CONCENTRATIONS OUTSIDE THIS RANGE. ACETAMINOPHEN CONCENTRATIONS >150 ug/mL AT 4 HOURS AFTER INGESTION AND >50 ug/mL AT 12 HOURS AFTER INGESTION ARE OFTEN ASSOCIATED WITH TOXIC REACTIONS.   cbc     Status: None    Collection Time: 08/13/16 10:26 AM  Result Value Ref Range   WBC 7.0 3.6 - 11.0 K/uL   RBC 4.43 3.80 - 5.20 MIL/uL   Hemoglobin 14.0 12.0 - 16.0 g/dL   HCT 87.9 91.1 - 79.9 %   MCV 92.7 80.0 - 100.0 fL   MCH 31.5 26.0 - 34.0 pg   MCHC 33.9 32.0 - 36.0 g/dL   RDW 40.5 00.3 - 60.1 %   Platelets 253 150 - 440 K/uL  Urine Drug Screen, Qualitative     Status: Abnormal   Collection Time: 08/13/16 10:26 AM  Result Value Ref Range   Tricyclic, Ur Screen NONE DETECTED NONE DETECTED   Amphetamines, Ur Screen NONE DETECTED NONE DETECTED   MDMA (Ecstasy)Ur Screen NONE DETECTED NONE DETECTED   Cocaine Metabolite,Ur [place] POSITIVE (A) NONE DETECTED   Opiate, Ur Screen NONE DETECTED NONE DETECTED   Phencyclidine (PCP) Ur S NONE DETECTED NONE DETECTED   Cannabinoid 50 Ng, Ur [place] NONE DETECTED NONE DETECTED   Barbiturates, Ur Screen NONE DETECTED NONE DETECTED   Benzodiazepine, Ur Scrn NONE DETECTED NONE DETECTED   Methadone Scn, Ur NONE DETECTED NONE DETECTED    Comment: (NOTE) 100  Tricyclics, urine               Cutoff 1000 ng/mL 200  Amphetamines, urine             Cutoff 1000 ng/mL 300  MDMA (Ecstasy), urine           Cutoff 500 ng/mL 400  Cocaine Metabolite, urine       Cutoff 300 ng/mL 500  Opiate, urine                   Cutoff 300 ng/mL 600  Phencyclidine (PCP), urine      Cutoff 25 ng/mL 700  Cannabinoid, urine              Cutoff 50 ng/mL 800  Barbiturates, urine             Cutoff 200 ng/mL 900  Benzodiazepine, urine           Cutoff 200 ng/mL 1000 Methadone, urine                Cutoff 300 ng/mL 1100 1200 The urine drug screen provides only a preliminary, unconfirmed 1300 analytical test result and should not be used for non-medical 1400 purposes. Clinical consideration and professional  judgment should 1500 be applied to any positive drug screen result due to possible 1600 interfering substances. A more specific alternate chemical method 1700 must be used in order to obtain a  confirmed analytical result.  1800 Gas chromato graphy / mass spectrometry (GC/MS) is the preferred 1900 confirmatory method.   Bilirubin, direct     Status: None   Collection Time: 08/13/16 10:26 AM  Result Value Ref Range   Bilirubin, Direct 0.2 0.1 - 0.5 mg/dL    Current Facility-Administered Medications  Medication Dose Route Frequency Provider Last Rate Last Dose  . albuterol (PROVENTIL HFA;VENTOLIN HFA) 108 (90 Base) MCG/ACT inhaler 2 puff  2 puff Inhalation Q4H PRN Gonzella Lex, MD      . clindamycin (CLEOCIN) capsule 300 mg  300 mg Oral Q8H Joanne Gavel, MD      . risperiDONE (RISPERDAL) tablet 2 mg  2 mg Oral BID Gonzella Lex, MD       Current Outpatient Prescriptions  Medication Sig Dispense Refill  . albuterol (PROVENTIL HFA;VENTOLIN HFA) 108 (90 BASE) MCG/ACT inhaler Inhale 2 puffs into the lungs every 6 (six) hours as needed for wheezing or shortness of breath. 1 Inhaler 2    Musculoskeletal: Strength & Muscle Tone: within normal limits Gait & Station: normal Patient leans: N/A  Psychiatric Specialty Exam: Physical Exam  Nursing note and vitals reviewed. Constitutional: She appears well-developed and well-nourished.  HENT:  Head: Normocephalic and atraumatic.  Eyes: Conjunctivae are normal. Pupils are equal, round, and reactive to light.  Neck: Normal range of motion.  Cardiovascular: Regular rhythm and normal heart sounds.   Respiratory: Effort normal. No respiratory distress.  GI: Soft.  Musculoskeletal: Normal range of motion.  Neurological: She is alert.  Skin: Skin is warm and dry.  Psychiatric: Her mood appears anxious. Her affect is blunt. Her speech is delayed. She is slowed and actively hallucinating. Thought content is paranoid. Cognition and memory are impaired. She expresses impulsivity. She exhibits a depressed mood. She expresses suicidal ideation.    Review of Systems  Constitutional: Negative.   HENT: Negative.   Eyes: Negative.    Respiratory: Negative.   Cardiovascular: Negative.   Gastrointestinal: Negative.   Musculoskeletal: Negative.   Skin: Negative.   Neurological: Negative.   Psychiatric/Behavioral: Positive for depression, hallucinations, substance abuse and suicidal ideas. Negative for memory loss. The patient is nervous/anxious and has insomnia.     Blood pressure 108/71, pulse 77, temperature 98.2 F (36.8 C), temperature source Oral, resp. rate 16, height '5\' 4"'$  (1.626 m), weight 72.6 kg (160 lb), last menstrual period 08/08/2016, SpO2 98 %.Body mass index is 27.46 kg/m.  General Appearance: Casual  Eye Contact:  Minimal  Speech:  Slow  Volume:  Decreased  Mood:  Dysphoric  Affect:  Depressed  Thought Process:  Goal Directed  Orientation:  Full (Time, Place, and Person)  Thought Content:  Hallucinations: Auditory  Suicidal Thoughts:  Yes.  with intent/plan  Homicidal Thoughts:  No  Memory:  Immediate;   Good Recent;   Good Remote;   Fair  Judgement:  Fair  Insight:  Fair  Psychomotor Activity:  Decreased  Concentration:  Concentration: Fair  Recall:  [employer] of Knowledge:  Fair  Language:  Fair  Akathisia:  No  Handed:  Right  AIMS (if indicated):     Assets:  Communication Skills Desire for Improvement Housing Physical Health Resilience  ADL's:  Intact  Cognition:  WNL  Sleep:  Treatment Plan Summary: Daily contact with patient to assess and evaluate symptoms and progress in treatment, Medication management and Plan Patient is endorsing active suicidal ideation with plan to jump out into traffic. She is endorsing hallucinations and psychotic symptoms. 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 She claims that she's not using cocaine regularly. Not sure if this is still substance induced or as a primary psychotic disorder. Patient will be admitted to the psychiatric ward. Continue risperidone out 4 mg a day. Full labs including hemoglobin A1c and  prolactin level will be evaluated. 15 minute checks in place.  Disposition: Recommend psychiatric Inpatient admission when medically cleared. Supportive therapy provided about ongoing stressors.  Alethia Berthold, MD 08/13/2016 4:21 PM

## 2016-08-13 NOTE — Progress Notes (Signed)
Pt being reviewed for possible admission to [facility].  Pts assessment has been faxed to the BHU for the charge nurse to review and provide bed assignment.      Nicole Jenine Krisher, MS, NCC, LPCA Therapeutic Triage Specialist    

## 2016-08-13 NOTE — Progress Notes (Signed)
[facility]. Toni Amendlapacs.  Attending Physician will be Dr. Jennet MaduroPucilowska.   Patient [facility].   Intake Paper Work has been signed and placed on patient chart.  ER staff is aware of the admission Carlisle Beers( [facility]  ER MD Sonnie AlamoMelinda ; Katie Patient's Nurse & [place] Patient Access).   08/13/2016 Cheryl FlashNicole Claborn Janusz, MS, NCC, LPCA Therapeutic Triage Specialist

## 2016-08-13 NOTE — ED Provider Notes (Signed)
[facility] Emergency Department Provider Note   ____________________________________________   First MD Initiated Contact with Patient 08/13/16 1045     (approximate)  I have reviewed the triage vital signs and the nursing notes.   HISTORY  Chief Complaint Suicidal    HPI Bridget Carpenter is a 25 y.o. female with history of eczema, depression, dissociative disorder, oppositional defiant disorder, PTSD, schizoaffective disorder and substance abuse who presents for evaluation under involuntary commitment for suicidal ideation with a plan to run into traffic to get hit by a car or lay down on the train tracks, gradual onset, constant, severe, ongoing for "a long time". Her girlfriend found her writing things in her journal about ways to die. She denies any chest pain or difficulty breathing, vomiting, diarrhea, fevers or chills. She does have a history of eczema and has been cutting her skin superficially in several places and feels that the areas may be "infected and I need the strongest antibiotic".   Past Medical History:  Diagnosis Date  . Asthma   . Depression 2009   Inpatient psych admission for SI, dissociative fugue  . Dissociative disorder or reaction 2009  . Eczema   . H/O: suicide attempt   . ODD (oppositional defiant disorder)   . PTSD (post-traumatic stress disorder)   . Schizoaffective disorder (HCC)   . Substance abuse     Patient Active Problem List   Diagnosis Date Noted  . Cocaine abuse with cocaine-induced mood disorder (HCC) 07/21/2015  . 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 Nasal discomfort 10/31/2011  . Decreased hearing 10/31/2011  . Asthma 09/23/2011  . Contraception management 09/23/2011  . Weight loss 09/23/2011  . TOBACCO USER 09/15/2009  . OPPOSITIONAL DEFIANT DISORDER 11/21/2008  . HEADACHE, CHRONIC 05/18/2008  . DISSOCIATIVE FUGUE 04/24/2008  . DEPRESSION  02/15/2008  . ATTENTION DEFICIT HYPERACTIVITY DISORDER 01/13/2008  . CHILD SEXUAL ABUSE 01/13/2008    Past Surgical History:  Procedure Laterality Date  . TONSILLECTOMY      Prior to Admission medications   Medication Sig Start Date End Date Taking? Authorizing Provider  albuterol (PROVENTIL HFA;VENTOLIN HFA) 108 (90 BASE) MCG/ACT inhaler Inhale 2 puffs into the lungs every 6 (six) hours as needed for wheezing or shortness of breath. 07/21/15  Yes Audery Amel, MD    Allergies Banana  Family History  Problem Relation Age of Onset  . Adopted: Yes    Social History Social History  Substance Use Topics  . Smoking status: Current Some Day Smoker    Packs/day: 0.50    Years: 2.00    Types: Cigarettes  . Smokeless tobacco: Never Used  . Alcohol use Yes    Review of Systems Constitutional: No fever/chills Eyes: No visual changes. ENT: No sore throat. Cardiovascular: Denies chest pain. Respiratory: Denies shortness of breath. Gastrointestinal: No abdominal pain.  No nausea, no vomiting.  No diarrhea.  No constipation. Genitourinary: Negative for dysuria. Musculoskeletal: Negative for back pain. Skin: Negative for rash. Neurological: Negative for headaches, focal weakness or numbness.  10-point ROS otherwise negative.  ____________________________________________   PHYSICAL EXAM:  VITAL SIGNS: ED Triage Vitals  Enc Vitals Group     BP 08/13/16 1022 116/75     Pulse Rate 08/13/16 1022 96     Resp 08/13/16 1022 17     Temp 08/13/16 1022 98.2 F (36.8 C)     Temp Source 08/13/16 1022 Oral     SpO2 08/13/16 1022 98 %  Weight 08/13/16 1022 160 lb (72.6 kg)     Height 08/13/16 1022 5\' 4"  (1.626 m)     Head Circumference --      Peak Flow --      Pain Score 08/13/16 1023 8     Pain Loc --      Pain Edu? --      Excl. in GC? --     Constitutional: Alert and oriented. Well appearing and in no acute distress. Eyes: Conjunctivae are normal. PERRL.  EOMI. Head: Atraumatic. Nose: No congestion/rhinnorhea. Mouth/Throat: Mucous membranes are moist.  Oropharynx non-erythematous. Neck: No stridor. Supple without meningismus. Cardiovascular: Normal rate, regular rhythm. Grossly normal heart sounds.  Good peripheral circulation. Respiratory: Normal respiratory effort.  No retractions. Lungs CTAB. Gastrointestinal: Soft and nontender. No distention. No CVA tenderness. Genitourinary: deferred Musculoskeletal: No lower extremity tenderness nor edema.  No joint effusions. Neurologic:  Normal speech and language. No gross focal neurologic deficits are appreciated. No gait instability. Skin:  Skin is warm, dry and intact. Patchy areas of eczema throughout the arms and legs with several excoriated areas with very mild surrounding swelling, no peeling drainage. Psychiatric: Mood is depressed and affect is flat. Speech and behavior are normal.  ____________________________________________   LABS (all labs ordered are listed, but only abnormal results are displayed)  Labs Reviewed  COMPREHENSIVE METABOLIC PANEL - Abnormal; Notable for the following:       Result Value   Creatinine, Ser 1.06 (*)    ALT 12 (*)    Total Bilirubin 2.5 (*)    All other components within normal limits  ACETAMINOPHEN LEVEL - Abnormal; Notable for the following:    Acetaminophen (Tylenol), Serum <10 (*)    All other components within normal limits  URINE DRUG SCREEN, QUALITATIVE ([facility] ONLY) - Abnormal; Notable for the following:    Cocaine Metabolite,Ur [place] POSITIVE (*)    All other components within normal limits  ETHANOL  SALICYLATE LEVEL  CBC  BILIRUBIN, DIRECT  POC URINE PREG, ED   ____________________________________________  EKG  none ____________________________________________  RADIOLOGY  RUQ ultrasound IMPRESSION: Gallbladder contracted, likely due to post-prandial state. No gallbladder pathology is appreciable at this time. If there  remains concern for potential gallbladder pathology, a follow-up study after a minimum of 8 hours fasting would be advisable. Study otherwise unremarkable. ____________________________________________   PROCEDURES  Procedure(s) performed: None  Procedures  Critical Care performed: No  ____________________________________________   INITIAL IMPRESSION / ASSESSMENT AND PLAN / ED COURSE  Pertinent labs & imaging results that were available during my care of the patient were reviewed by me and considered in my medical decision making (see chart for details).  Bridget [place] C Schultes is a 25 y.o. female with history of eczema, depression, dissociative disorder, oppositional defiant disorder, PTSD, schizoaffective disorder and substance abuse who presents for evaluation under involuntary commitment for suicidal ideation with a plan to run into traffic to get hit by a car or lay down on the train tracks, gradual onset, constant, severe, ongoing for "a long time". On exam, she is nontoxic appearing and in no acute distress. Vital signs stable, she is afebrile. She has no acute medical complaints with the exception of these excoriated areas of eczema, we'll start clindamycin for treatment given risk for developing cellulitis. We'll place involuntary commitment, consult psychiatry, Behavioral Health, obtain screening psych labs.  ----------------------------------------- 3:53 PM on 08/13/2016 ----------------------------------------- I review the patient's labs. CBC and CMP are generally unremarkable with the exception of mild elevation  of the T bili, 2.5, normal direct bilirubin. Color ultrasound was performed but was equivocal because of contraction of the gallbladder however patient has no right upper quadrant or epigastric tenderness. The cause of her indirect hyperbilirubinemia  is not clear to me but this does not require further workup at this time, that can be performed as an outpatient. Urine drug  screen was positive for Cocaine, undetectable ethanol, acetaminophen and slow slight levels. Patient is medically cleared for psychiatric evaluation. Pregnancy test is negative.      Clinical Course     ____________________________________________   FINAL CLINICAL IMPRESSION(S) / ED DIAGNOSES  Final diagnoses:  Suicidal ideation  Hyperbilirubinemia      NEW MEDICATIONS STARTED DURING THIS VISIT:  New Prescriptions   No medications on file     Note:  This document was prepared using Dragon voice recognition software and may include unintentional dictation errors.    Gayla DossEryka A Romulo Okray, MD 08/13/16 [phone]

## 2016-08-13 NOTE — Progress Notes (Signed)
Patient pleasant and cooperative during admission assessment. Patient denies SI/HI at this time. Patient states that the voices tells her to blink.Denies any visual hallucinations now. Patient informed of fall risk status, fall risk assessed "low" at this time. Patient oriented to unit/staff/room. Patient denies any questions/concerns at this time. Patient safe on unit with Q15 minute checks for safety. Skin assessment & body search done.No contraband found.

## 2016-08-13 NOTE — ED Triage Notes (Signed)
Pt states her girl friend read her journal and brought her here because she was writing different ways to die and is having suicidal thoughts.
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
## 2016-08-13 NOTE — ED Notes (Addendum)
Bedside pregnancy test negative

## 2016-08-13 NOTE — ED Notes (Signed)
Pt to US.

## 2016-08-13 NOTE — ED Notes (Signed)
Psych MD at bedside

## 2016-08-14 DIAGNOSIS — F333 Major depressive disorder, recurrent, severe with psychotic symptoms: Secondary | ICD-10-CM

## 2016-08-14 LAB — URINALYSIS COMPLETE WITH MICROSCOPIC (ARMC ONLY)
Bacteria, UA: NONE SEEN
Bilirubin Urine: NEGATIVE
Glucose, UA: NEGATIVE mg/dL
Hgb urine dipstick: NEGATIVE
KETONES UR: NEGATIVE mg/dL
Nitrite: NEGATIVE
PROTEIN: 30 mg/dL — AB
RBC / HPF: NONE SEEN RBC/hpf (ref 0–5)
SPECIFIC GRAVITY, URINE: 1.024 (ref 1.005–1.030)
pH: 7 (ref 5.0–8.0)

## 2016-08-14 LAB — LIPID PANEL
CHOLESTEROL: 137 mg/dL (ref 0–200)
HDL: 90 mg/dL (ref >40)
LDL Cholesterol: 32 mg/dL (ref 0–99)
TRIGLYCERIDES: 74 mg/dL (ref <150)
Total CHOL/HDL Ratio: 1.5 RATIO
VLDL: 15 mg/dL (ref 0–40)

## 2016-08-14 LAB — TSH: TSH: 1.82 u[IU]/mL (ref 0.350–4.500)

## 2016-08-14 LAB — PREGNANCY, URINE: Preg Test, Ur: NEGATIVE

## 2016-08-14 LAB — HEMOGLOBIN A1C: HEMOGLOBIN A1C: 4.9 % (ref 4.0–6.0)

## 2016-08-14 MED ORDER — TRAZODONE HCL 100 MG PO TABS
100.0000 mg | ORAL_TABLET | Freq: Every day | ORAL | Status: DC
  Administered 2016-08-15 – 2016-08-17 (×3): 100 mg via ORAL
  Filled 2016-08-14 (×4): qty 1

## 2016-08-14 MED ORDER — NICOTINE 21 MG/24HR TD PT24
21.0000 mg | MEDICATED_PATCH | Freq: Every day | TRANSDERMAL | Status: DC
  Filled 2016-08-14 (×4): qty 1

## 2016-08-14 NOTE — Progress Notes (Signed)
D: Pt denies SI/HI/AVH. Pt is pleasant and cooperative. Patient's affect is flat and sad, thoughts are disorganized she appears less anxious, isolates to self,  minimal interaction  with peers and staff.  A: Pt was offered support and encouragement. Pt was given scheduled medications. Pt was encouraged to attend groups. Q 15 minute checks were done for safety.  R:Pt did not attend evening group. Pt is taking medication. Pt has no complaints.Pt receptive to treatment and safety maintained on unit.

## 2016-08-14 NOTE — [facility] Group Notes (Signed)
[facility] Group Notes:  (Nursing/MHT/Case Management/Adjunct)  Date:  08/14/2016  Time:  4:20 PM  Type of Therapy:  Psychoeducational Skills  Participation Level:  Did Not Attend  Bridget Carpenter 08/14/2016, 4:20 PM

## 2016-08-14 NOTE — Progress Notes (Signed)
Patient ID: [name] Weed, female   DOB: 05/22/1991, 25 y.o.   MRN: [id number] D: Patient reports poor sleep last night.  She presents with flat, blunted affect.  Patient appears suspicious and guarded.  She was irritable this am when she did not receive her antibiotic.  Upon further investigation, patient was supposed to get antibiotic every 8 hours.  She was given the medication and she was appreciative.  She reports passive SI and auditory hallucinations.  She is able to contract for safety on the unit.  Patient gets up for meals, however, isolates in her room for the rest of the day.  She is not attending groups at this time.  She is compliant with her medication.  A: Continue to monitor medication management and MD orders.  Safety checks completed every 15 minutes per protocol.  Offer support and encouragement as needed. R: Patient has little interaction with staff and peers.

## 2016-08-14 NOTE — Plan of Care (Signed)
Problem: Education: Goal: Mental status will improve Outcome: Not Progressing Patient remains guarded and suspicious.  She has minimal interaction with staff and peers.

## 2016-08-14 NOTE — [facility] Group Notes (Signed)
[facility] LCSW Group Therapy Note  Type of Therapy and Topic:  Group Therapy:  Goals Group: SMART Goals  Participation Level:  Pt did not attend group. CSW invited pt to group.   Description of Group:    The purpose of a daily goals group is to assist and guide patients in setting recovery/wellness-related goals.  The objective is to set goals as they relate to the crisis in which they were admitted. Patients will be using SMART goal modalities to set measurable goals.  Characteristics of realistic goals will be discussed and patients will be assisted in setting and processing how one will reach their goal. Facilitator will also assist patients in applying interventions and coping skills learned in psycho-education groups to the SMART goal and process how one will achieve defined goal.  Therapeutic Goals: -Patients will develop and document one goal related to or their crisis in which brought them into treatment. -Patients will be guided by LCSW using SMART goal setting modality in how to set a measurable, attainable, realistic and time sensitive goal.  -Patients will process barriers in reaching goal. -Patients will process interventions in how to overcome and successful in reaching goal.   Summary of Patient Progress:  Patient Goal: Pt did not attend group. CSW invited pt to group.     Therapeutic Modalities:   Motivational Interviewing  Engineer, manufacturing systemsCognitive Behavioral Therapy Crisis Intervention Model SMART goals setting  Ludwin Flahive G. Garnette CzechSampson MSW, [facility] Surgical [facility] IncCSWA 08/14/2016 11:36 AM

## 2016-08-14 NOTE — Progress Notes (Signed)
Recreation Therapy Notes  Date: 08.17.17 Time: 9:30 am Location: Craft Room  Group Topic: Leisure Education  Goal Area(s) Addresses:  Patient will identify things they are grateful for.  Patient will be educated on why it is important to be grateful.  Behavioral Response: Did not attend  Intervention: Grateful Wheel  Activity: Patients were given an I Am Grateful For worksheet and instructed to write things they are grateful for under each category.  Education: LRT educated patients on why it is important to be grateful.  Education Outcome: Patient did not attend group.  Clinical Observations/Feedback: Patient did not attend group.  Jacquelynn CreeGreene,Ellean Firman M, LRT/CTRS 08/14/2016 10:24 AM

## 2016-08-14 NOTE — Progress Notes (Signed)
D: Pt denies SI/HI/AVH. Pt is irritable, and does not want to participate in treatment plan. Patient c/o too much  sedation, in response her trazodone was held. Patient's affect is flat and sad, thoughts are disorganized she appears less anxious, isolates to self,  minimal interaction  with peers and staff.  A: Pt was offered support and encouragement. Pt was given scheduled medications. Pt was encouraged to attend groups. Q 15 minute checks were done for safety.  R:Pt did not attend evening group. Pt is taking medication. Pt has no complaints.Pt receptive to treatment and safety maintained on unit.
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
## 2016-08-14 NOTE — [facility] Counselor (Signed)
CSW asked the pt to complete PSA, pt requested to complete PSA on following day due to fatigue.  Pt presented as fatigued, as evidenced by slurred speech, delay in answering questions and prompts and closed eyes.  CSW asked pt if pt felt okay, pt replied, "Yes, just tired, I need to catch up on my sleep".  York GriceJonathan Elden Brucato, LCSWA, LCAS  08/14/16
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
## 2016-08-14 NOTE — [facility] Group Notes (Signed)
[facility] LCSW Group Therapy  08/14/2016 3:14 PM  Type of Therapy:  Group Therapy  Participation Level:  Pt did not attend group. CSW invited pt to group.   Summary of Progress/Problems: Balance in life: Patients will discuss the concept of balance and how it looks and feels to be unbalanced. Pt will identify areas in their life that is unbalanced and ways to become more balanced.    Aubrina Nieman G. Garnette CzechSampson MSW, LCSWA 08/14/2016, 3:14 PM

## 2016-08-15 LAB — PROLACTIN: Prolactin: 49.4 ng/mL — ABNORMAL HIGH (ref 4.8–23.3)

## 2016-08-15 MED ORDER — CLOBETASOL PROPIONATE 0.05 % EX OINT
TOPICAL_OINTMENT | Freq: Two times a day (BID) | CUTANEOUS | Status: DC
  Administered 2016-08-15: 1 via TOPICAL
  Administered 2016-08-16 – 2016-08-18 (×4): via TOPICAL
  Filled 2016-08-15: qty 15

## 2016-08-15 NOTE — Plan of Care (Signed)
Problem: Medication: Goal: Compliance with prescribed medication regimen will improve Outcome: Not Progressing Patient continues to refuse medication.  Patient not in compliance with prescribed medication regimen.

## 2016-08-15 NOTE — [facility] Group Notes (Signed)
[facility] Group Notes:  (Nursing/MHT/Case Management/Adjunct)  Date:  08/15/2016  Time:  3:45 AM  Type of Therapy:  Evening Wrap-up Group  Participation Level:  Did Not Attend  Participation Quality:  N/A  Affect:  N/A  Cognitive:  N/A  Insight:  None  Engagement in Group:  Did Not Attend  Modes of Intervention:  Activity  Summary of Progress/Problems:  Tomasita MorrowChelsea Nanta Raahim Shartzer 08/15/2016, 3:45 AM

## 2016-08-15 NOTE — Progress Notes (Signed)
D:  Patient denies SI/AVH/HI.  Patient continues to isolate herself to her room.   Patient does not attend groups.  Patient affect continues to be flat.  Patient refuses to take prescribed medication. A:  Patient encouraged to attend groups.  Patient educated about medication regimen.   R:  Patient continues to refuse medication.  Patient did not attend groups.  Patient safety maintained with 15 minute checks.

## 2016-08-15 NOTE — [facility] Group Notes (Signed)
[facility] LCSW Group Therapy  08/15/2016 11:31 AM  Type of Therapy:  Group Therapy  Participation Level:  Pt did not attend group. CSW invited pt to group.   Summary of Progress/Problems:Feelings around Relapse. Group members discussed the meaning of relapse and shared personal stories of relapse, how it affected them and others, and how they perceived themselves during this time. Group members were encouraged to identify triggers, warning signs and coping skills used when facing the possibility of relapse. Social supports were discussed and explored in detail. Patients also discussed facing disappointment and how that can trigger someone to relapse.   Tarius Stangelo G. Garnette CzechSampson MSW, LCSWA 08/15/2016, 11:31 AM

## 2016-08-15 NOTE — [facility] Group Notes (Signed)
[facility] Group Notes:  (Nursing/MHT/Case Management/Adjunct)  Date:  08/15/2016  Time:  4:26 PM  Type of Therapy:  Psychoeducational Skills  Participation Level:  Did Not Attend  Twanna Hymanda C Marleen Moret 08/15/2016, 4:26 PM

## 2016-08-15 NOTE — Progress Notes (Signed)
[facility] MD Progress Note  08/15/2016 6:43 PM [name] C Millikin  MRN:  [id number]  Subjective:  Bridget Carpenter is a 25 year old female with a history of psychotic depression. substance abuse and treatment noncompliance admitted for worsening of depression, auditory hallucinations, and suicidal ideation in the context of treatment noncompliance and cocaine use.  Today patient reports no improvement. She is in bed all day long, complaining of hallucinations and suicidal ideation. She is able to contract for safety. No group participation. She complains of infected eczema on her wrists. She was started on Cleocin in the ER.  Principal Problem: Severe recurrent major depressive disorder with psychotic features (HCC) Diagnosis:   Patient Active Problem List   Diagnosis Date Noted  . 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 Psychosis [F29] 08/13/2016  . 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 Decreased hearing [H91.90] 10/31/2011  . Asthma [J45.909] 09/23/2011  . Contraception management [Z30.9] 09/23/2011  . Weight loss [R63.4] 09/23/2011  . Tobacco use disorder [F17.200] 09/15/2009  . OPPOSITIONAL DEFIANT DISORDER [F91.3] 11/21/2008  . HEADACHE, CHRONIC [R51] 05/18/2008  . Severe recurrent major depressive disorder with psychotic features (HCC) [F33.3] 02/15/2008  . ATTENTION DEFICIT HYPERACTIVITY DISORDER [F90.9] 01/13/2008  . CHILD SEXUAL ABUSE [T74.22XA] 01/13/2008   Total Time spent with patient: 20 minutes  Past Psychiatric History: psychosis, depression, substance abuse.  Past Medical History:  Past Medical History:  Diagnosis Date  . Asthma   . Depression 2009   Inpatient psych admission for SI, dissociative fugue  . Dissociative disorder or reaction 2009  . Eczema   . H/O: suicide attempt   . ODD (oppositional defiant disorder)   . PTSD (post-traumatic stress disorder)   . Schizoaffective disorder (HCC)   .  Substance abuse     Past Surgical History:  Procedure Laterality Date  . TONSILLECTOMY     Family History:  Family History  Problem Relation Age of Onset  . Adopted: Yes   Family Psychiatric  History: see H7P. Social History:  History  Alcohol Use  . Yes     History  Drug Use  . Types: Marijuana, "Crack" cocaine, Heroin    Social History   Social History  . Marital status: Single    Spouse name: N/A  . Number of children: N/A  . Years of education: N/A   Social History Main Topics  . Smoking status: Current Some Day Smoker    Packs/day: 0.50    Years: 2.00    Types: Cigarettes  . Smokeless tobacco: Never Used  . Alcohol use Yes  . Drug use:     Types: Marijuana, "Crack" cocaine, Heroin  . Sexual activity: Yes    Birth control/ protection: Condom   Other Topics Concern  . None   Social History Narrative  . None   Additional Social History:                         Sleep: Fair  Appetite:  Fair  Current Medications: Current Facility-Administered Medications  Medication Dose Route Frequency Provider Last Rate Last Dose  . acetaminophen (TYLENOL) tablet 650 mg  650 mg Oral Q6H PRN Audery Amel, MD      . albuterol (PROVENTIL HFA;VENTOLIN HFA) 108 (90 Base) MCG/ACT inhaler 2 puff  2 puff Inhalation Q4H PRN Audery Amel, MD      . alum & mag hydroxide-simeth (MAALOX/MYLANTA) 200-200-20 MG/5ML suspension 30 mL  30 mL Oral Q4H PRN  Audery AmelJohn T Clapacs, MD      . clindamycin (CLEOCIN) capsule 300 mg  300 mg Oral Q8H Audery AmelJohn T Clapacs, MD   300 mg at 08/15/16 1354  . clobetasol ointment (TEMOVATE) 0.05 %   Topical BID Ashtan Girtman B Gavyn Ybarra, MD      . magnesium hydroxide (MILK OF MAGNESIA) suspension 30 mL  30 mL Oral Daily PRN Audery AmelJohn T Clapacs, MD      . nicotine (NICODERM CQ - dosed in mg/24 hours) patch 21 mg  21 mg Transdermal Daily Gordy Goar B Telesha Deguzman, MD      . risperiDONE (RISPERDAL) tablet 2 mg  2 mg Oral BID Audery AmelJohn T Clapacs, MD   2 mg at 08/14/16 0840  .  traZODone (DESYREL) tablet 100 mg  100 mg Oral QHS Bridget ProwsJolanta B Renata Gambino, MD        Lab Results:  Results for orders placed or performed during the hospital encounter of 08/13/16 (from the past 48 hour(s))  Urinalysis complete, with microscopic ([facility] only)     Status: Abnormal   Collection Time: 08/14/16  5:32 PM  Result Value Ref Range   Color, Urine YELLOW (A) YELLOW   APPearance CLOUDY (A) CLEAR   Glucose, UA NEGATIVE NEGATIVE mg/dL   Bilirubin Urine NEGATIVE NEGATIVE   Ketones, ur NEGATIVE NEGATIVE mg/dL   Specific Gravity, Urine 1.024 1.005 - 1.030   Hgb urine dipstick NEGATIVE NEGATIVE   pH 7.0 5.0 - 8.0   Protein, ur 30 (A) NEGATIVE mg/dL   Nitrite NEGATIVE NEGATIVE   Leukocytes, UA 1+ (A) NEGATIVE   RBC / HPF NONE SEEN 0 - 5 RBC/hpf   WBC, UA 0-5 0 - 5 WBC/hpf   Bacteria, UA NONE SEEN NONE SEEN   Squamous Epithelial / LPF 6-30 (A) NONE SEEN  Pregnancy, urine     Status: None   Collection Time: 08/14/16  5:32 PM  Result Value Ref Range   Preg Test, Ur NEGATIVE NEGATIVE    Blood Alcohol level:  Lab Results  Component Value Date   [facility]   ETH <5 07/17/2015    Metabolic Disorder Labs: Lab Results  Component Value Date   HGBA1C 4.9 08/13/2016   Lab Results  Component Value Date   PROLACTIN 49.4 (H) 08/13/2016   Lab Results  Component Value Date   CHOL 137 08/13/2016   TRIG 74 08/13/2016   HDL 90 08/13/2016   CHOLHDL 1.5 08/13/2016   VLDL 15 08/13/2016   LDLCALC 32 08/13/2016    Physical Findings: AIMS:  , ,  ,  ,    CIWA:    COWS:     Musculoskeletal: Strength & Muscle Tone: within normal limits Gait & Station: normal Patient leans: N/A  Psychiatric Specialty Exam: Physical Exam  Nursing note and vitals reviewed.   Review of Systems  Psychiatric/Behavioral: Positive for depression, hallucinations, substance abuse and suicidal ideas.  All other systems reviewed and are negative.   Blood pressure 107/66, pulse 81, temperature 98.1  F (36.7 C), resp. rate 18, height 5\' 4"  (1.626 m), weight 65.8 kg (145 lb), last menstrual period 08/08/2016, SpO2 98 %.Body mass index is 24.89 kg/m.  General Appearance: Fairly Groomed  Eye Contact:  Poor  Speech:  Clear and Coherent  Volume:  Decreased  Mood:  Dysphoric and Irritable  Affect:  Blunt  Thought Process:  Goal Directed  Orientation:  Full (Time, Place, and Person)  Thought Content:  Hallucinations: Auditory  Suicidal Thoughts:  Yes.  with intent/plan  Homicidal Thoughts:  No  Memory:  Immediate;   Fair Recent;   Fair Remote;   Fair  Judgement:  Impaired  Insight:  Shallow  Psychomotor Activity:  Psychomotor Retardation  Concentration:  Concentration: Fair and Attention Span: Fair  Recall:  [employer]Fair  Fund of Knowledge:  Fair  Language:  Fair  Akathisia:  No  Handed:  Right  AIMS (if indicated):     Assets:  Communication Skills Desire for Improvement Financial Resources/Insurance Housing Intimacy Resilience Social Support  ADL's:  Intact  Cognition:  WNL  Sleep:  Number of Hours: 7.15     Treatment Plan Summary: Daily contact with patient to assess and evaluate symptoms and progress in treatment and Medication management   Bridget Carpenter is a 25 year old female with a history of depression, mood instability, psychosis and substance use who came to the hospital suicidal and psychotic in the context of cocaine abuse.  1. Suicidal ideation. The patient is able to contract for safety in the hospital.  2. Mood and psychosis. She was started on Risperdal. She is never compliant with treatment in the outpatient setting. She already told me that she would not take any medications at home as they are not helpful.  3. Cocaine abuse. The patient is not interested in substance abuse treatment.  4. Insomnia. Trazodone is available.  5. Asthma. She is on inhaler.  6. Eczema. She is on Cleocin forpresumed infection. We added Clobetasol ointment.   7. Smoking.  Nicotine patch is available.  8. Metabolic syndrome monitoring. Lipid profile, TSH, and hemoglobin A1c are normal. Prolactin 48.  9. Disposition. She will be discharge home with her mother. She will follow up with [facility].   Kristine LineaJolanta Karoline Fleer, MD 08/15/2016, 6:43 PM

## 2016-08-15 NOTE — Progress Notes (Signed)
Recreation Therapy Notes  Date: 08.18.17 Time: 1:00 pm Location: Craft Room  Group Topic: Self-expression, Coping Skills  Goal Area(s) Addresses:  Patient will effectively use art as a means of self-expression. Patient will recognize positive benefit of self-expression. Patient will be able to identify one emotion experienced during group session. Patient will identify use of art as a coping skill.  Behavioral Response: Did not attend  Intervention: Two Faces of Me  Activity: Patients were given a blank face worksheet and instructed to draw a line down the middle. On one side of the face, patients were instructed to draw or write how they felt when they were admitted to the hospital. On the other side of the face, patients were instructed to draw or write how they want to feel when they are d/c.  Education: LRT educated patients on healthy coping skills.  Education Outcome: Patient did not attend group.   Clinical Observations/Feedback: Patient did not attend group.  Jacquelynn CreeGreene,Allsion Nogales M, LRT/CTRS 08/15/2016 3:00 PM

## 2016-08-16 MED ORDER — HYDROXYZINE HCL 25 MG PO TABS: 25.0000 mg | ORAL_TABLET | ORAL | Status: DC | PRN

## 2016-08-16 NOTE — Progress Notes (Signed)
[facility]  08/16/2016 4:12 PM Bridget Carpenter  MRN:  [id number]  Subjective:  Bridget Carpenter is a 25 year old female with a history of psychotic depression. substance abuse and treatment noncompliance admitted for worsening of depression, auditory hallucinations, and suicidal ideation in the context of treatment noncompliance and cocaine use.  She remains noncompliant with her medications. She has been refusing her medications. She reported that she is not psychotic. However during the interview she reported that she has been hearing voices and all kinds of stuff. She reported that she does not want to take the Risperdal. She stated that she has tried other medications. She is remains focused on getting Ativan or something else to help with her anxiety. After much discussion she agreed to take Atarax in addition to her Risperdal.  She currently denied having any suicidal ideations or plans. She has been refusing to attend groups as well.    Principal Problem: Severe recurrent major depressive disorder with psychotic features (HCC) Diagnosis:   Patient Active Problem List   Diagnosis Date Noted  . 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 Psychosis [F29] 08/13/2016  . 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 Decreased hearing [H91.90] 10/31/2011  . Asthma [J45.909] 09/23/2011  . Contraception management [Z30.9] 09/23/2011  . Weight loss [R63.4] 09/23/2011  . Tobacco use disorder [F17.200] 09/15/2009  . OPPOSITIONAL DEFIANT DISORDER [F91.3] 11/21/2008  . HEADACHE, CHRONIC [R51] 05/18/2008  . Severe recurrent major depressive disorder with psychotic features (HCC) [F33.3] 02/15/2008  . ATTENTION DEFICIT HYPERACTIVITY DISORDER [F90.9] 01/13/2008  . CHILD SEXUAL ABUSE [T74.22XA] 01/13/2008   Total Time spent with patient: 20 minutes  Past Psychiatric History: psychosis, depression, substance abuse.  Past Medical  History:  Past Medical History:  Diagnosis Date  . Asthma   . Depression 2009   Inpatient psych admission for SI, dissociative fugue  . Dissociative disorder or reaction 2009  . Eczema   . H/O: suicide attempt   . ODD (oppositional defiant disorder)   . PTSD (post-traumatic stress disorder)   . Schizoaffective disorder (HCC)   . Substance abuse     Past Surgical History:  Procedure Laterality Date  . TONSILLECTOMY     Family History:  Family History  Problem Relation Age of Onset  . Adopted: Yes   Family Psychiatric  History: see H7P. Social History:  History  Alcohol Use  . Yes     History  Drug Use  . Types: Marijuana, "Crack" cocaine, Heroin    Social History   Social History  . Marital status: Single    Spouse name: N/A  . Number of children: N/A  . Years of education: N/A   Social History Main Topics  . Smoking status: Current Some Day Smoker    Packs/day: 0.50    Years: 2.00    Types: Cigarettes  . Smokeless tobacco: Never Used  . Alcohol use Yes  . Drug use:     Types: Marijuana, "Crack" cocaine, Heroin  . Sexual activity: Yes    Birth control/ protection: Condom   Other Topics Concern  . None   Social History Narrative  . None   Additional Social History:                         Sleep: Fair  Appetite:  Fair  Current Medications: Current Facility-Administered Medications  Medication Dose Route Frequency Provider Last Rate Last Dose  . acetaminophen (TYLENOL) tablet 650  mg  650 mg Oral Q6H PRN Audery AmelJohn T Clapacs, MD      . albuterol (PROVENTIL HFA;VENTOLIN HFA) 108 (90 Base) MCG/ACT inhaler 2 puff  2 puff Inhalation Q4H PRN Audery AmelJohn T Clapacs, MD      . alum & mag hydroxide-simeth (MAALOX/MYLANTA) 200-200-20 MG/5ML suspension 30 mL  30 mL Oral Q4H PRN Audery AmelJohn T Clapacs, MD      . clindamycin (CLEOCIN) capsule 300 mg  300 mg Oral Q8H Audery AmelJohn T Clapacs, MD   300 mg at 08/16/16 1551  . clobetasol ointment (TEMOVATE) 0.05 %   Topical BID Jolanta  B Pucilowska, MD      . hydrOXYzine (ATARAX/VISTARIL) tablet 25 mg  25 mg Oral Q4H PRN Brandy HaleUzma Lene Mckay, MD      . magnesium hydroxide (MILK OF MAGNESIA) suspension 30 mL  30 mL Oral Daily PRN Audery AmelJohn T Clapacs, MD      . nicotine (NICODERM CQ - dosed in mg/24 hours) patch 21 mg  21 mg Transdermal Daily Jolanta B Pucilowska, MD      . risperiDONE (RISPERDAL) tablet 2 mg  2 mg Oral BID Audery AmelJohn T Clapacs, MD   2 mg at 08/16/16 [id number]  . traZODone (DESYREL) tablet 100 mg  100 mg Oral QHS Shari ProwsJolanta B Pucilowska, MD   100 mg at 08/15/16 2238    Lab Results:  Results for orders placed or performed during the hospital encounter of 08/13/16 (from the past 48 hour(s))  Urinalysis complete, with microscopic ([facility] only)     Status: Abnormal   Collection Time: 08/14/16  5:32 PM  Result Value Ref Range   Color, Urine YELLOW (A) YELLOW   APPearance CLOUDY (A) CLEAR   Glucose, UA NEGATIVE NEGATIVE mg/dL   Bilirubin Urine NEGATIVE NEGATIVE   Ketones, ur NEGATIVE NEGATIVE mg/dL   Specific Gravity, Urine 1.024 1.005 - 1.030   Hgb urine dipstick NEGATIVE NEGATIVE   pH 7.0 5.0 - 8.0   Protein, ur 30 (A) NEGATIVE mg/dL   Nitrite NEGATIVE NEGATIVE   Leukocytes, UA 1+ (A) NEGATIVE   RBC / HPF NONE SEEN 0 - 5 RBC/hpf   WBC, UA 0-5 0 - 5 WBC/hpf   Bacteria, UA NONE SEEN NONE SEEN   Squamous Epithelial / LPF 6-30 (A) NONE SEEN  Pregnancy, urine     Status: None   Collection Time: 08/14/16  5:32 PM  Result Value Ref Range   Preg Test, Ur NEGATIVE NEGATIVE    Blood Alcohol level:  Lab Results  Component Value Date   [facility]   ETH <5 07/17/2015    Metabolic Disorder Labs: Lab Results  Component Value Date   HGBA1C 4.9 08/13/2016   Lab Results  Component Value Date   PROLACTIN 49.4 (H) 08/13/2016   Lab Results  Component Value Date   CHOL 137 08/13/2016   TRIG 74 08/13/2016   HDL 90 08/13/2016   CHOLHDL 1.5 08/13/2016   VLDL 15 08/13/2016   LDLCALC 32 08/13/2016    Physical Findings: AIMS:   , ,  ,  ,    CIWA:    COWS:     Musculoskeletal: Strength & Muscle Tone: within normal limits Gait & Station: normal Patient leans: N/A  Psychiatric Specialty Exam: Physical Exam  Nursing note and vitals reviewed.   Review of Systems  Psychiatric/Behavioral: Positive for depression, hallucinations, substance abuse and suicidal ideas.  All other systems reviewed and are negative.   Blood pressure 103/61, pulse 64, temperature 97.9 F (36.6 C),  temperature source Oral, resp. rate 18, height 5\' 4"  (1.626 m), weight 145 lb (65.8 kg), last menstrual period 08/08/2016, SpO2 98 %.Body mass index is 24.89 kg/m.  General Appearance: Fairly Groomed  Eye Contact:  Poor  Speech:  Clear and Coherent  Volume:  Decreased  Mood:  Dysphoric and Irritable  Affect:  Blunt  Thought Process:  Goal Directed  Orientation:  Full (Time, Place, and Person)  Thought Content:  Hallucinations: Auditory  Suicidal Thoughts:  Yes.  with intent/plan  Homicidal Thoughts:  No  Memory:  Immediate;   Fair Recent;   Fair Remote;   Fair  Judgement:  Impaired  Insight:  Shallow  Psychomotor Activity:  Psychomotor Retardation  Concentration:  Concentration: Fair and Attention Span: Fair  Recall:  [employer] of Knowledge:  Fair  Language:  Fair  Akathisia:  No  Handed:  Right  AIMS (if indicated):     Assets:  Communication Skills Desire for Improvement Financial Resources/Insurance Housing Intimacy Resilience Social Support  ADL's:  Intact  Cognition:  WNL  Sleep:  Number of Hours: 7.5     Treatment Plan Summary: Daily contact with patient to assess and evaluate symptoms and progress in treatment and Medication management   Ms. Vanmetre is a 25 year old female with a history of depression, mood instability, psychosis and substance use who came to the hospital suicidal and psychotic in the context of cocaine abuse.  1. Suicidal ideation. The patient is able to contract for safety in the  hospital.  2. Mood and psychosis. She was started on Risperdal. She is never compliant with treatment in the outpatient setting. She already told me that she would not take any medications at home as they are not helpful.  3. Cocaine abuse. The patient is not interested in substance abuse treatment.  4. Insomnia. Trazodone is available.  5. Asthma. She is on inhaler.  6. Eczema. She is on Cleocin forpresumed infection. We added Clobetasol ointment.   7. Smoking. Nicotine patch is available.  8. Metabolic syndrome monitoring. Lipid profile, TSH, and hemoglobin A1c are normal. Prolactin 48.  9. Disposition. She will be discharge home with her mother. She will follow up with [facility].   Brandy Hale, MD 08/16/2016, 4:12 PM

## 2016-08-16 NOTE — Progress Notes (Signed)
D: Patient has been very flat and isolates to her room. She still complains of drowsiness. Denies SI/HI. Does have AH but can't explain them. Did not attend group.  A: Medication was given with education. Encouragement was provided.  R: Patient refused Risperdal but took other medications. She has remained calm and cooperative. Safety maintained with 15 min checks.

## 2016-08-16 NOTE — [facility] Counselor (Signed)
*  Late Entry On 8/18 CSW made attempt to complete PSA but pt was eating.  CSW made another attempt but pt was asleep and did not respond to CSW prompts.    Dorothe PeaJonathan F. Howard Patton, LCSWA, LCAS

## 2016-08-16 NOTE — [facility] Counselor (Signed)
CSW attempted to meet with patient twice today to complete PSA. CSW entered patient's room around 10:15am. Patient was lying down asleep in her bed. CSW introduced herself and stated she wanted to met with patient to complete assessment. Patient stated "Can you come back later, I do want to do this right now." CSW asked patient if she was feeling okay and asked if there was a particular reason why she did not want to meet at this time. Patient responded "I just don't feel like it". CSW informed patient she would return in about 30 minutes to see if patient wants to meet then. Patient nonverbally agreed by nodding her head "yes" and rolled back over in her bed.   CSW returned to patient room around 11:00 to attempt PSA with patient again. Patient stated "That man just gave me some medication and I can't really talk right now". CSW asked patient to clarify by asking "Are you unwilling to do the assessment at this time?". Patient responded "No I don't feel like talking".   CSW is unable to complete assessment with patient at this time due to patient refusing assessment.   Illa Enlow G. [place], [facility] 08/16/2016 11:44 AM

## 2016-08-16 NOTE — Progress Notes (Signed)
Pt has been pleasant and cooperative Pt has been compliant with taking po meds with some encouragement. Pt denies SI and A/V hallucinations . Pt has been seclusive to her room.Pt not attending unit activities.

## 2016-08-16 NOTE — [facility] Group Notes (Signed)
[facility] LCSW Group Therapy  08/16/2016 3:23 PM  Type of Therapy:  Group Therapy  Participation Level:  Pt did not attend group. CSW invited pt to group.   Summary of Progress/Problems: Self esteem: Patients discussed self esteem and how it impacts them. They discussed what aspects in their lives has influenced their self esteem. They were challenged to identify changes that are needed in order to improve self esteem. Patients participated in activity where they had to identify positive adjectives they felt described their personality. Patients shared with the group on the following areas: Things I am good at, What I like about my appearance, I've helped others by, What I value the most, compliments I have received, challenges I have overcome, thing that make me unique, and Times I've made others happy.   Anamika Kueker G. Garnette CzechSampson MSW, LCSWA 08/16/2016, 3:23 PM

## 2016-08-16 NOTE — [facility] Group Notes (Signed)
[facility] Group Notes:  (Nursing/MHT/Case Management/Adjunct)  Date:  08/16/2016  Time:  3:51 AM  Type of Therapy:  Psychoeducational Skills  Participation Level:  Did Not Attend  Summary of Progress/Problems:  Bridget MilroyLaquanda Y Cyerra Carpenter 08/16/2016, 3:51 AM

## 2016-08-17 NOTE — Plan of Care (Signed)
Problem: Safety: Goal: Periods of time without injury will increase Outcome: Progressing Patient has remained free from injury this shift.  Patient provided with a safe environment.  Patient safety maintained with 15 minute checks.   

## 2016-08-17 NOTE — [facility] Group Notes (Signed)
[facility] Group Notes:  (Nursing/MHT/Case Management/Adjunct)  Date:  08/17/2016  Time:  12:51 AM  Type of Therapy:  Psychoeducational Skills  Participation Level:  Did Not Attend  Summary of Progress/Problems:  Bridget MilroyLaquanda Y Azarah Carpenter 08/17/2016, 12:51 AM

## 2016-08-17 NOTE — Progress Notes (Signed)
D:  Patient denies SI/AVH/HI.  Patient continues to isolate herself to her room.  Patient does not attend groups.  Patient affect continues to be flat. A:  Patient encouraged to attend groups.  Patient educated about medication regimen.  Patient administered scheduled medications.  15 minute safety checks conducted. R:  Patient was med compliant this shift.  Patient refused to attend groups.  Patient safety maintained with 15 minute checks.

## 2016-08-17 NOTE — [facility] Counselor (Addendum)
CSW met with patient and completed PSA. Patient provided consent to communicate with her mother Caleen Jobs [phone] and [phone]. Patient is declining follow-up at this time.   Alexzandra Bilton G. [place], [facility] 08/17/2016 10:25 AM

## 2016-08-17 NOTE — [facility] Group Notes (Signed)
[facility] LCSW Group Therapy  08/17/2016 2:35 PM  Type of Therapy:  Group Therapy  Participation Level:  Pt did not attend group. CSW invited pt to group.   Summary of Progress/Problems:Coping Skills: Patients defined and discussed healthy coping skills. Patients identified healthy coping skills they would like to try during hospitalization and after discharge. CSW offered insight to varying coping skills that may have been new to patients such as practicing mindfulness.   Phuoc Huy G. Garnette CzechSampson MSW, LCSWA 08/17/2016, 2:36 PM

## 2016-08-17 NOTE — Progress Notes (Signed)
Pt denies SI/AVH. Affect flat. Pt noted to be complaining of things being dirty such as room, bathroom and dryer on unit. Environmental Services came to clean Pts room and bathroom. Pt was compliant with all medications this evening. Showered and went to bed. Did not attend evening group. Remained mostly isolated to room or pacing unit. Guarded, forwards little. Slightly suspicious but cooperative and agreeable with treatment. Safety maintained. Will continue to monitor.
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
## 2016-08-18 MED ORDER — TRAZODONE HCL 100 MG PO TABS: 100.0000 mg | ORAL_TABLET | Freq: Every day | ORAL | 1 refills | Status: DC

## 2016-08-18 MED ORDER — CLINDAMYCIN HCL 150 MG PO CAPS
300.0000 mg | ORAL_CAPSULE | Freq: Three times a day (TID) | ORAL | Status: DC
  Administered 2016-08-18: 300 mg via ORAL

## 2016-08-18 MED ORDER — CLINDAMYCIN HCL 300 MG PO CAPS: 300.0000 mg | ORAL_CAPSULE | Freq: Three times a day (TID) | ORAL | 0 refills | Status: DC

## 2016-08-18 MED ORDER — HYDROXYZINE HCL 25 MG PO TABS: 25.0000 mg | ORAL_TABLET | ORAL | 1 refills | Status: DC | PRN

## 2016-08-18 MED ORDER — CLOBETASOL PROPIONATE 0.05 % EX OINT: TOPICAL_OINTMENT | Freq: Two times a day (BID) | CUTANEOUS | 0 refills | Status: DC

## 2016-08-18 MED ORDER — RISPERIDONE 2 MG PO TABS: 2.0000 mg | ORAL_TABLET | Freq: Two times a day (BID) | ORAL | 1 refills | Status: DC

## 2016-08-18 NOTE — Progress Notes (Addendum)
  [facility] :  Will you be returning to the same living situation after discharge:  Yes,  pt [place] At discharge, do you have transportation home?: Yes,  pt will be picked up by her aunt Do you have the ability to pay for your medications: Yes,  pt will be provided with prescriptionsa discharge  Release of information consent forms completed and in the chart;  Patient's signature needed at discharge.  Patient to Follow up at: Follow-up Information    PRIDEMORE, Thornell SartoriusLAURA TAYLOR, MD .   Specialty:  Pediatrics Contact information: [address] [place] [address] [phone]        [facility] .   Why:  DO NOT SEND PT REFUSES TO SIGN ROI Please arrive for your appointment for medication management, substance abuse treatment and therapy Contact information: [facility] [address] [place], [place] [address] Phone: [phone] DO NOT FAX : PT REFUSES FOLLOW UP Fax: [phone]([phone]           Next level of care provider has access to [facility] Link:no  Safety Planning and Suicide Prevention discussed: Yes,  completed with pt  Have you used any form of tobacco in the last 30 days? (Cigarettes, Smokeless Tobacco, Cigars, and/or Pipes): Yes  Has patient been referred to the Quitline?: Patient refused referral  Patient has been referred for addiction treatment: Yes  Dorothe PeaJonathan F Javien Tesch 08/18/2016, 11:34 AM
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
## 2016-08-18 NOTE — BHH Group Notes (Signed)
BHH Group Notes:  (Nursing/MHT/Case Management/Adjunct)  Date:  08/18/2016  Time:  2:29 AM  Type of Therapy:  Group Therapy  Participation Level:  Active  Participation Quality:  Appropriate  Affect:  Appropriate  Cognitive:  Alert  Insight:  Improving  Engagement in Group:  Developing/Improving  Modes of Intervention:  Discussion  Summary of Progress/Problems: Pt stated that her goal was to attend a group. As she begin to share her thoughts she begin to talk about her relationship with God. Her thoughts seemed a bit disorganized but staff was able to redirect her.   Bridget Carpenter 08/18/2016, 2:29 AM

## 2016-08-18 NOTE — Progress Notes (Signed)
Patient's discharge instructions, teachings, medications and follow-up appointments discussed with the patient, she verbalized understanding. Patient was given back her belongings and no concerns were voiced. Patient wanted to know the results of her urine drug screen at admission (results read to patient). She was picked up by family member at the lobby. No concerns/distress noted at the time of discharge.

## 2016-08-18 NOTE — BHH Suicide Risk Assessment (Signed)
[name]  Physician notified.  Pt completed SPE with CSW.   [name] 08/18/2016, 11:33 AM
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
## 2016-08-18 NOTE — Progress Notes (Signed)
Recreation Therapy Notes  Date: 08.21.17 Time: 9:30 am Location: Craft Room  Group Topic: Self-expression  Goal Area(s) Addresses:  Patient will identify one color per emotion listed on wheel. Patient will verbalize benefit of using art as a means of self-expression. Patient will verbalize one emotion experienced during session. Patient will be educated on other forms of self-expression.  Behavioral Response: Attentive, Interactive  Intervention: Emotion Wheel  Activity: Patients were given an Arboriculturistmotion Wheel worksheet and instructed to pick a color for each emotion listed on the wheel.  Education: LRT educated patients on other forms of self-expression.  Education Outcome: Acknowledges education/In group clarification offered  Clinical Observations/Feedback: Patient completed activity by picking a color for each emotion. Patient contributed to group discussion by stating what colors she picked and why.  Jacquelynn CreeGreene,Emonee Winkowski M, LRT/CTRS 08/18/2016 10:31 AM

## 2016-08-18 NOTE — Progress Notes (Signed)
D:Pt denies SI/HI/AVH. Pt is worried, upset and anxious because the medical provider told her she was positive for marijuana. Patient stated "l  Know l have been doing cocaine but not taking marijuana"  Patient urine drug test was examined it was noted that her result was negative for Marijuana. Writer unsure why MD told patient she was positive for marijuana. Patient's affect is flat and sad, thoughts are organized interacting with peers and staff.  A:Pt was offered support and encouragement. Pt was given scheduled medications. Pt was encouraged to attend groups. Q 15 minute checks were done for safety.  R:Pt did not attend evening group. Pt is taking medication. Pt has no complaints.Pt receptive to treatment and safety maintained on unit.

## 2016-08-18 NOTE — Plan of Care (Signed)
Problem: Medication: Goal: Compliance with prescribed medication regimen will improve Outcome: Progressing Medication compliant   

## 2016-10-21 ENCOUNTER — Encounter: Payer: Self-pay | Admitting: Emergency Medicine

## 2016-10-21 ENCOUNTER — Emergency Department
Admission: EM | Admit: 2016-10-21 | Discharge: 2016-10-22 | Disposition: A | Discharge status: Home / Self Care | Payer: Medicaid Other | Attending: Emergency Medicine | Admitting: Emergency Medicine

## 2016-10-21 ENCOUNTER — Emergency Department: Payer: Medicaid Other

## 2016-10-21 DIAGNOSIS — Z87891 Personal history of nicotine dependence: Secondary | ICD-10-CM | POA: Diagnosis not present

## 2016-10-21 DIAGNOSIS — L309 Dermatitis, unspecified: Secondary | ICD-10-CM | POA: Insufficient documentation

## 2016-10-21 DIAGNOSIS — S0512XA Contusion of eyeball and orbital tissues, left eye, initial encounter: Secondary | ICD-10-CM | POA: Diagnosis not present

## 2016-10-21 DIAGNOSIS — J45909 Unspecified asthma, uncomplicated: Secondary | ICD-10-CM | POA: Diagnosis not present

## 2016-10-21 DIAGNOSIS — S0990XA Unspecified injury of head, initial encounter: Secondary | ICD-10-CM | POA: Diagnosis present

## 2016-10-21 DIAGNOSIS — F909 Attention-deficit hyperactivity disorder, unspecified type: Secondary | ICD-10-CM | POA: Insufficient documentation

## 2016-10-21 DIAGNOSIS — Y929 Unspecified place or not applicable: Secondary | ICD-10-CM | POA: Insufficient documentation

## 2016-10-21 DIAGNOSIS — S0510XA Contusion of eyeball and orbital tissues, unspecified eye, initial encounter: Secondary | ICD-10-CM

## 2016-10-21 DIAGNOSIS — Y939 Activity, unspecified: Secondary | ICD-10-CM | POA: Diagnosis not present

## 2016-10-21 DIAGNOSIS — S0511XA Contusion of eyeball and orbital tissues, right eye, initial encounter: Secondary | ICD-10-CM | POA: Insufficient documentation

## 2016-10-21 DIAGNOSIS — F259 Schizoaffective disorder, unspecified: Secondary | ICD-10-CM

## 2016-10-21 DIAGNOSIS — Z5181 Encounter for therapeutic drug level monitoring: Secondary | ICD-10-CM | POA: Diagnosis not present

## 2016-10-21 DIAGNOSIS — Y999 Unspecified external cause status: Secondary | ICD-10-CM | POA: Diagnosis not present

## 2016-10-21 DIAGNOSIS — F1910 Other psychoactive substance abuse, uncomplicated: Secondary | ICD-10-CM | POA: Insufficient documentation

## 2016-10-21 DIAGNOSIS — L03119 Cellulitis of unspecified part of limb: Secondary | ICD-10-CM | POA: Diagnosis not present

## 2016-10-21 MED ORDER — SULFAMETHOXAZOLE-TRIMETHOPRIM 800-160 MG PO TABS
2.0000 | ORAL_TABLET | Freq: Once | ORAL | Status: AC
  Administered 2016-10-21: 2 via ORAL
  Filled 2016-10-21: qty 2

## 2016-10-21 NOTE — ED Notes (Addendum)
Pt gave verbal consent to have [facility] called on her behalf. Nurse caring for pt made aware.

## 2016-10-21 NOTE — ED Notes (Signed)
Pt taken to CT via stretcher with [facility], EDT.
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
## 2016-10-21 NOTE — ED Notes (Signed)
Pt asked repeatedly to change into gown. Pt directs conversation towards her skin. Pt itching skin, throwing skin flakes into floor.
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
## 2016-10-22 LAB — COMPREHENSIVE METABOLIC PANEL
ALBUMIN: 4.3 g/dL (ref 3.5–5.0)
ALT: 13 U/L — AB (ref 14–54)
ANION GAP: 7 (ref 5–15)
AST: 39 U/L (ref 15–41)
Alkaline Phosphatase: 74 U/L (ref 38–126)
BUN: 15 mg/dL (ref 6–20)
CHLORIDE: 106 mmol/L (ref 101–111)
CO2: 24 mmol/L (ref 22–32)
CREATININE: 0.92 mg/dL (ref 0.44–1.00)
Calcium: 8.6 mg/dL — ABNORMAL LOW (ref 8.9–10.3)
GFR calc non Af Amer: >60 mL/min (ref >60)
GLUCOSE: 87 mg/dL (ref 65–99)
Potassium: 3.8 mmol/L (ref 3.5–5.1)
SODIUM: 137 mmol/L (ref 135–145)
Total Bilirubin: 0.7 mg/dL (ref 0.3–1.2)
Total Protein: 7.6 g/dL (ref 6.5–8.1)

## 2016-10-22 LAB — POCT PREGNANCY, URINE: Preg Test, Ur: NEGATIVE

## 2016-10-22 LAB — URINALYSIS COMPLETE WITH MICROSCOPIC (ARMC ONLY)
BACTERIA UA: NONE SEEN
Bilirubin Urine: NEGATIVE
Glucose, UA: NEGATIVE mg/dL
HGB URINE DIPSTICK: NEGATIVE
Ketones, ur: NEGATIVE mg/dL
LEUKOCYTES UA: NEGATIVE
NITRITE: NEGATIVE
PH: 5 (ref 5.0–8.0)
PROTEIN: NEGATIVE mg/dL
SPECIFIC GRAVITY, URINE: 1.024 (ref 1.005–1.030)
SQUAMOUS EPITHELIAL / LPF: NONE SEEN

## 2016-10-22 LAB — CBC
HEMATOCRIT: 43.4 % (ref 35.0–47.0)
HEMOGLOBIN: 14.2 g/dL (ref 12.0–16.0)
MCH: 31.2 pg (ref 26.0–34.0)
MCHC: 32.6 g/dL (ref 32.0–36.0)
MCV: 95.7 fL (ref 80.0–100.0)
Platelets: 232 10*3/uL (ref 150–440)
RBC: 4.54 MIL/uL (ref 3.80–5.20)
RDW: 13.6 % (ref 11.5–14.5)
WBC: 8.8 10*3/uL (ref 3.6–11.0)

## 2016-10-22 LAB — URINE DRUG SCREEN, QUALITATIVE (ARMC ONLY)
Amphetamines, Ur Screen: NOT DETECTED
BARBITURATES, UR SCREEN: NOT DETECTED
BENZODIAZEPINE, UR SCRN: NOT DETECTED
CANNABINOID 50 NG, UR ~~LOC~~: NOT DETECTED
Cocaine Metabolite,Ur ~~LOC~~: POSITIVE — AB
MDMA (Ecstasy)Ur Screen: NOT DETECTED
Methadone Scn, Ur: NOT DETECTED
Opiate, Ur Screen: POSITIVE — AB
Phencyclidine (PCP) Ur S: NOT DETECTED
TRICYCLIC, UR SCREEN: NOT DETECTED

## 2016-10-22 LAB — SALICYLATE LEVEL

## 2016-10-22 LAB — ACETAMINOPHEN LEVEL

## 2016-10-22 MED ORDER — SULFAMETHOXAZOLE-TRIMETHOPRIM 800-160 MG PO TABS: 2.0000 | ORAL_TABLET | Freq: Two times a day (BID) | ORAL | 0 refills | Status: DC

## 2016-10-22 MED ORDER — DIPHENHYDRAMINE HCL 50 MG/ML IJ SOLN
50.0000 mg | Freq: Once | INTRAMUSCULAR | Status: AC
  Administered 2016-10-22: 50 mg via INTRAVENOUS
  Filled 2016-10-22: qty 1
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
## 2016-10-22 NOTE — ED Notes (Signed)
Bridget Carpenter, [name] is in room.
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
## 2016-10-22 NOTE — ED Notes (Signed)
Pt violently scratching self, making herself bleed. Pt told to stop scratching, this RN acknowledged that pts skin is itchy, and stated that scratching makes it much worse.

## 2016-10-22 NOTE — ED Notes (Signed)
Attempted to dress pt out per RN.  Pt stated she did not want to stay and wanted to speak to the doctor.  Dr. Manson PasseyBrown notified and spoke to pt.  Dr. Manson PasseyBrown stated to go ahead and dress pt out again since she agreed.  Pt stated she changed her mind and wanted to keep her belongings with her and not wear the burgundy scrubs.  Jae DireKate RN notified.
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
## 2016-10-22 NOTE — ED Notes (Signed)
Pt given Malawiturkey sandwich, chips, graham crackers.

## 2016-10-22 NOTE — ED Notes (Signed)
Pt returned from CT via stretcher with Mayra, EDT.

## 2016-11-04 ENCOUNTER — Emergency Department
Admission: EM | Admit: 2016-11-04 | Discharge: 2016-11-05 | Disposition: A | Discharge status: Other Acute Inpatient Hospital | Payer: Medicaid Other | Attending: Emergency Medicine | Admitting: Emergency Medicine

## 2016-11-04 ENCOUNTER — Encounter: Payer: Self-pay | Admitting: Emergency Medicine

## 2016-11-04 DIAGNOSIS — F17210 Nicotine dependence, cigarettes, uncomplicated: Secondary | ICD-10-CM | POA: Diagnosis not present

## 2016-11-04 DIAGNOSIS — J45909 Unspecified asthma, uncomplicated: Secondary | ICD-10-CM | POA: Insufficient documentation

## 2016-11-04 DIAGNOSIS — F909 Attention-deficit hyperactivity disorder, unspecified type: Secondary | ICD-10-CM | POA: Insufficient documentation

## 2016-11-04 DIAGNOSIS — F329 Major depressive disorder, single episode, unspecified: Secondary | ICD-10-CM | POA: Insufficient documentation

## 2016-11-04 DIAGNOSIS — R45851 Suicidal ideations: Secondary | ICD-10-CM | POA: Diagnosis present

## 2016-11-04 DIAGNOSIS — Z79899 Other long term (current) drug therapy: Secondary | ICD-10-CM | POA: Diagnosis not present

## 2016-11-04 DIAGNOSIS — F29 Unspecified psychosis not due to a substance or known physiological condition: Secondary | ICD-10-CM | POA: Insufficient documentation

## 2016-11-04 DIAGNOSIS — F32A Depression, unspecified: Secondary | ICD-10-CM

## 2016-11-04 DIAGNOSIS — F19988 Other psychoactive substance use, unspecified with other psychoactive substance-induced disorder: Secondary | ICD-10-CM | POA: Diagnosis not present

## 2016-11-04 DIAGNOSIS — F09 Unspecified mental disorder due to known physiological condition: Secondary | ICD-10-CM | POA: Diagnosis not present

## 2016-11-04 LAB — ACETAMINOPHEN LEVEL: Acetaminophen (Tylenol), Serum: <10 ug/mL — ABNORMAL LOW (ref 10–30)

## 2016-11-04 LAB — CBC
HCT: 37.4 % (ref 35.0–47.0)
Hemoglobin: 12.7 g/dL (ref 12.0–16.0)
MCH: 31.3 pg (ref 26.0–34.0)
MCHC: 33.8 g/dL (ref 32.0–36.0)
MCV: 92.5 fL (ref 80.0–100.0)
PLATELETS: 238 10*3/uL (ref 150–440)
RBC: 4.04 MIL/uL (ref 3.80–5.20)
RDW: 13.6 % (ref 11.5–14.5)
WBC: 6.9 10*3/uL (ref 3.6–11.0)

## 2016-11-04 LAB — COMPREHENSIVE METABOLIC PANEL
ALT: 9 U/L — AB (ref 14–54)
AST: 27 U/L (ref 15–41)
Albumin: 3.9 g/dL (ref 3.5–5.0)
Alkaline Phosphatase: 60 U/L (ref 38–126)
Anion gap: 7 (ref 5–15)
BUN: 14 mg/dL (ref 6–20)
CALCIUM: 8.7 mg/dL — AB (ref 8.9–10.3)
CHLORIDE: 108 mmol/L (ref 101–111)
CO2: 20 mmol/L — ABNORMAL LOW (ref 22–32)
CREATININE: 1.13 mg/dL — AB (ref 0.44–1.00)
Glucose, Bld: 91 mg/dL (ref 65–99)
Potassium: 4.2 mmol/L (ref 3.5–5.1)
Sodium: 135 mmol/L (ref 135–145)
Total Bilirubin: 0.6 mg/dL (ref 0.3–1.2)
Total Protein: 7.2 g/dL (ref 6.5–8.1)

## 2016-11-04 LAB — ETHANOL

## 2016-11-04 LAB — SALICYLATE LEVEL

## 2016-11-04 MED ORDER — OLANZAPINE 10 MG PO TABS
10.0000 mg | ORAL_TABLET | Freq: Every day | ORAL | Status: DC
  Administered 2016-11-04: 10 mg via ORAL

## 2016-11-04 MED ORDER — OLANZAPINE 10 MG PO TABS
ORAL_TABLET | ORAL | Status: AC
  Administered 2016-11-04: 10 mg via ORAL
  Filled 2016-11-04: qty 1

## 2016-11-04 NOTE — BH Assessment (Signed)
Assessment Note  [facility] C [facility] is an 25 y.o. female. Patient reports "I was having suicidal thoughts". Pt denied plan and/or means to follow through on her thoughts to harm herself. Pt reports recent illicit substance use: cocaine, cannabis, and alcohol, with date of last use 11/02/2016. Pt currently lives with her mother and 25yo-daughter. Further into assessment, pt states "I don't know if I want to live. If a doctor came to me and said 'I have a shot that can kill you with no pain', I would take it". She reports having depressive symptoms everyday - "I'm just miserable, I'm not happy". Pt denied homicidal ideations. [facility] reports command auditory hallucinations ("whispers; the other voices talk to me everyday ... I don't trust I'm safe") and visual hallucinations ("shadows, creepy people with creepy faces"). She reports decreased sleep patterns with difficulty getting to sleep. Pt states she was beginning psychiatric services with [facility] where she had been seen by a psychiatrist. Pt was prescribed Seroquel; pt reports non-compliance with her current medications. Pt states "I don't want to take medicine, I want to find out what's wrong with me". Pt reports her 639 yo daughter is currently in the care of pt's mother.  Diagnosis: Unspecified Depressive Disorder  Past Medical History:  Past Medical History:  Diagnosis Date  . Asthma   . Depression 2009   Inpatient psych admission for SI, dissociative fugue  . Dissociative disorder or reaction 2009  . Eczema   . H/O: suicide attempt   . ODD (oppositional defiant disorder)   . PTSD (post-traumatic stress disorder)   . Schizoaffective disorder (HCC)   . Substance abuse     Past Surgical History:  Procedure Laterality Date  . TONSILLECTOMY      Family History:  Family History  Problem Relation Age of Onset  . Adopted: Yes    Social History:  reports that she has been smoking Cigarettes.  She has a 1.00 pack-year smoking history. She  has never used smokeless tobacco. She reports that she drinks alcohol. She reports that she does not use drugs.  Additional Social History:  Alcohol / Drug Use Pain Medications: None Reported Prescriptions: Seroquel Over the Counter: None Reported History of alcohol / drug use?: Yes Longest period of sobriety (when/how long): UKN Negative Consequences of Use: Financial, Personal relationships, Work / School Withdrawal Symptoms:  (None Reported) Substance #1 Name of Substance 1: Cocaine 1 - Age of First Use: 19 1 - Amount (size/oz): "1/2 a gram" 1 - Frequency: UKN 1 - Duration: UKN 1 - Last Use / Amount: 11/02/2016 Substance #2 Name of Substance 2: Cannabis 2 - Age of First Use: 17 2 - Amount (size/oz): "a joint" 2 - Frequency: UKN 2 - Duration: UKN 2 - Last Use / Amount: 11/02/2016 Substance #3 Name of Substance 3: Alcohol 3 - Age of First Use: 15 3 - Amount (size/oz): "40 ounce" 3 - Frequency: UKN 3 - Duration: UKN 3 - Last Use / Amount: 11/02/2016  CIWA: CIWA-Ar BP: 112/67 Pulse Rate: (!) 56 COWS:    Allergies:  Allergies  Allergen Reactions  . Banana Hives    Home Medications:  (Not in a hospital admission)  OB/GYN Status:  Patient's last menstrual period was 10/20/2016.  General Assessment Data [facility] Assessment: In system Is this a Tele or Face-to-Face Assessment?: Face-to-Face Is this an Initial Assessment or a Re-assessment for this encounter?: Initial Assessment Marital status: Single Maiden name: N/A Is patient pregnant?:  No Pregnancy Status: No Living Arrangements: Parent, Children (Pt's 25yo daughter) Can pt return to current living arrangement?: Yes Admission Status: Voluntary Is patient capable of signing voluntary admission?: Yes Referral Source: Self/Family/Friend Insurance type: Medicaid  Medical Screening Exam [facility] Walk-[facility] ONLY) Medical Exam completed: Yes  Crisis Care Plan Living Arrangements: Parent,  Children (Pt's 25yo daughter) Legal Guardian: Other: (Self) [facility] Name of Therapist: None Reported  Education Status Is patient currently in school?: No Current Grade: N/A Highest grade of school patient has completed: 9th Grade Name of school: N/A Contact person: N/A  Risk to self with the past 6 months Suicidal Ideation: Yes-Currently Present Has patient been a risk to self within the past 6 months prior to admission? : No Suicidal Intent: Yes-Currently Present Has patient had any suicidal intent within the past 6 months prior to admission? : No Is patient at risk for suicide?: Yes Suicidal Plan?: No Has patient had any suicidal plan within the past 6 months prior to admission? : No Access to Means: No What has been your use of drugs/alcohol within the last 12 months?:  (Cocaine, Cannabis, Alcohol) Previous Attempts/Gestures: No How many times?: 0 Other Self Harm Risks: None Triggers for Past Attempts: None known Intentional Self Injurious Behavior: None Family Suicide History: Unknown Recent stressful life event(s): Conflict (Comment), Financial Problems, Legal Issues Persecutory voices/beliefs?: Yes Depression: Yes Depression Symptoms: Tearfulness, Isolating, Feeling worthless/self pity, Loss of interest in usual pleasures Substance abuse history and/or treatment for substance abuse?: Yes Suicide prevention information given to non-admitted patients: Yes  Risk to Others within the past 6 months Homicidal Ideation: No Does patient have any lifetime risk of violence toward others beyond the six months prior to admission? : No Thoughts of Harm to Others: No Current Homicidal Intent: No Current Homicidal Plan: No Access to Homicidal Means: No Identified Victim: N/A History of harm to others?: No Assessment of Violence: None Noted Violent Behavior Description: N/A Does patient have access to weapons?: No Criminal Charges Pending?:  No Does patient have a court date: No Is patient on probation?: Yes (Pt is on house arrest (wear wrist band))  Psychosis Hallucinations: Auditory, Visual, With command (Pt reports command hallucinations; whispers; shadows) Delusions: None noted  Mental Status Report Appearance/Hygiene: In scrubs, In hospital gown Eye Contact: Good Motor Activity: Freedom of movement Speech: Logical/coherent, Soft Level of Consciousness: Alert Mood: Depressed Affect: Depressed, Flat Anxiety Level: Minimal Thought Processes: Coherent, Relevant Judgement: Unimpaired Orientation: Person, Place, Time, Situation Obsessive Compulsive Thoughts/Behaviors: None  Cognitive Functioning Concentration: Normal Memory: Recent Intact, Remote Intact IQ: Average Insight: Fair Impulse Control: Fair Appetite: Good Weight Loss: 0 Weight Gain: 0 Sleep: Decreased Total Hours of Sleep: 2 Vegetative Symptoms: None  ADLScreening [facility]) Patient's cognitive ability adequate to safely complete daily activities?: Yes Patient able to express need for assistance with ADLs?: Yes Independently performs ADLs?: Yes (appropriate for developmental age)  Prior Inpatient Therapy Prior Inpatient Therapy: Yes Prior Therapy Dates: UKN [facility] Reason for Treatment: Depression  Prior Outpatient Therapy Prior Outpatient Therapy: Yes Prior Therapy Dates: Current Prior Therapy Facilty/Provider(s): [facility] Reason for Treatment: Depression Does patient have an ACCT team?: No Does patient have Intensive In-House Services?  : No Does patient have [facility] services? : No Does patient have P4CC services?: No  ADL Screening (condition at time of admission) Patient's cognitive ability adequate to safely complete daily activities?: Yes Patient able to express need for assistance with  ADLs?: Yes Independently performs ADLs?: Yes (appropriate for developmental age)        Abuse/Neglect Assessment (Assessment to be complete while patient is alone) Physical Abuse: Denies Verbal Abuse: Denies Sexual Abuse: Denies Exploitation of patient/patient's resources: Denies Self-Neglect: Denies Values / Beliefs Cultural Requests During Hospitalization: None Spiritual Requests During Hospitalization: None Consults Spiritual Care Consult Needed: No Social Work Consult Needed: No Merchant navy officer (For Healthcare) Does patient have an advance directive?: No Would patient like information on creating an advanced directive?: Yes English as a second language teacher given    Additional Information 1:1 In Past 12 Months?: No CIRT Risk: No Elopement Risk: No Does patient have medical clearance?: Yes  Child/Adolescent Assessment Running Away Risk: Denies Bed-Wetting: Denies Destruction of Property: Denies Cruelty to Animals: Denies Stealing: Denies Rebellious/Defies Authority: Denies Satanic Involvement: Denies Archivist: Denies Problems at [employer]: Denies Gang Involvement: Denies  Disposition:  Disposition Initial Assessment Completed for this Encounter: Yes Disposition of Patient: Referred to (Psych MD) Patient referred to:  (Psych MD)  On Site Evaluation by:   Reviewed with Physician:    Wilmon Arms 11/04/2016 9:33 PM

## 2016-11-04 NOTE — ED Notes (Signed)
Pt dressed out by this tech. Pt belongings consisted of black shoes,yellow socks,blue pants,black jacket,gray sweater,white earrings,a tan bra and a suitcase with pink flowers.

## 2016-11-04 NOTE — ED Notes (Signed)
Pt is alert and oriented on admission. Pt mood is depressed and her affect is anxious. Pt does endorse SI but was able to contract for for safety with Clinical research associatewriter. Pt also reports AH. Writer oriented pt to the BHU and allowed her to chose a room. Food and drink provided and she seems more at ease at this time. Pt requests that NO BANANAS be given to the patient due to allergy and her SI. She might chose to eat it if given. Writer discussed tx plan and 15 minute checks are ongoing for safety. Medication reconciliation requested via quad RN

## 2016-11-04 NOTE — ED Notes (Signed)
Supper provided along with an extra drink   Pt requests to not be moved into the BHU  I informed her that the only option was to stay in the hallway all night and she stated "the hallway will be fine but I do not do well in the BHU."  I told her I would document and pass info on in report

## 2016-11-04 NOTE — ED Notes (Signed)

## 2016-11-04 NOTE — ED Triage Notes (Signed)
"  I am having suicidal thoughts".  Patient states "it has been off and on for a long time".  [facility] [address]--had an appointment today but missed the appointment.  Patient states "I'm guessing I will overdose.  Its the fastest way.  You don't have to work very hard at it".  Denies HI.

## 2016-11-04 NOTE — ED Notes (Signed)
Pt unable to urinate at this time. Given specimen cup for when is able to void.
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
## 2016-11-05 ENCOUNTER — Inpatient Hospital Stay
Admission: EM | Admit: 2016-11-05 | Discharge: 2016-11-12 | DRG: 885 | Disposition: A | Discharge status: Home / Self Care | Payer: Medicaid Other | Source: Intra-hospital | Attending: Psychiatry | Admitting: Psychiatry

## 2016-11-05 DIAGNOSIS — F603 Borderline personality disorder: Secondary | ICD-10-CM | POA: Diagnosis present

## 2016-11-05 DIAGNOSIS — J45909 Unspecified asthma, uncomplicated: Secondary | ICD-10-CM | POA: Diagnosis present

## 2016-11-05 DIAGNOSIS — Z62810 Personal history of physical and sexual abuse in childhood: Secondary | ICD-10-CM | POA: Diagnosis present

## 2016-11-05 DIAGNOSIS — F1010 Alcohol abuse, uncomplicated: Secondary | ICD-10-CM | POA: Diagnosis present

## 2016-11-05 DIAGNOSIS — Z9119 Patient's noncompliance with other medical treatment and regimen: Secondary | ICD-10-CM

## 2016-11-05 DIAGNOSIS — F17210 Nicotine dependence, cigarettes, uncomplicated: Secondary | ICD-10-CM | POA: Diagnosis present

## 2016-11-05 DIAGNOSIS — F14259 Cocaine dependence with cocaine-induced psychotic disorder, unspecified: Secondary | ICD-10-CM | POA: Diagnosis present

## 2016-11-05 DIAGNOSIS — F329 Major depressive disorder, single episode, unspecified: Secondary | ICD-10-CM | POA: Diagnosis not present

## 2016-11-05 DIAGNOSIS — F332 Major depressive disorder, recurrent severe without psychotic features: Secondary | ICD-10-CM | POA: Diagnosis not present

## 2016-11-05 DIAGNOSIS — G4700 Insomnia, unspecified: Secondary | ICD-10-CM | POA: Diagnosis present

## 2016-11-05 DIAGNOSIS — F1420 Cocaine dependence, uncomplicated: Secondary | ICD-10-CM

## 2016-11-05 DIAGNOSIS — F17200 Nicotine dependence, unspecified, uncomplicated: Secondary | ICD-10-CM | POA: Diagnosis present

## 2016-11-05 DIAGNOSIS — F09 Unspecified mental disorder due to known physiological condition: Secondary | ICD-10-CM

## 2016-11-05 DIAGNOSIS — F19988 Other psychoactive substance use, unspecified with other psychoactive substance-induced disorder: Secondary | ICD-10-CM

## 2016-11-05 DIAGNOSIS — L309 Dermatitis, unspecified: Secondary | ICD-10-CM | POA: Diagnosis present

## 2016-11-05 DIAGNOSIS — F1220 Cannabis dependence, uncomplicated: Secondary | ICD-10-CM | POA: Diagnosis present

## 2016-11-05 DIAGNOSIS — F29 Unspecified psychosis not due to a substance or known physiological condition: Secondary | ICD-10-CM | POA: Diagnosis not present

## 2016-11-05 DIAGNOSIS — R45851 Suicidal ideations: Secondary | ICD-10-CM | POA: Diagnosis not present

## 2016-11-05 DIAGNOSIS — Z79899 Other long term (current) drug therapy: Secondary | ICD-10-CM | POA: Diagnosis not present

## 2016-11-05 LAB — RPR: RPR: NONREACTIVE

## 2016-11-05 MED ORDER — TRAZODONE HCL 100 MG PO TABS: 100.0000 mg | ORAL_TABLET | Freq: Every day | ORAL | Status: DC

## 2016-11-05 MED ORDER — ACETAMINOPHEN 325 MG PO TABS: 650.0000 mg | ORAL_TABLET | Freq: Four times a day (QID) | ORAL | Status: DC | PRN

## 2016-11-05 MED ORDER — RISPERIDONE 1 MG PO TABS
1.0000 mg | ORAL_TABLET | Freq: Two times a day (BID) | ORAL | Status: DC
  Filled 2016-11-05 (×2): qty 1

## 2016-11-05 MED ORDER — MAGNESIUM HYDROXIDE 400 MG/5ML PO SUSP: 30.0000 mL | Freq: Every day | ORAL | Status: DC | PRN

## 2016-11-05 MED ORDER — TRIAMCINOLONE ACETONIDE 0.1 % EX CREA
TOPICAL_CREAM | Freq: Two times a day (BID) | CUTANEOUS | Status: DC
  Administered 2016-11-05: 13:00:00 via TOPICAL
  Filled 2016-11-05: qty 15

## 2016-11-05 MED ORDER — ALUM & MAG HYDROXIDE-SIMETH 200-200-20 MG/5ML PO SUSP: 30.0000 mL | ORAL | Status: DC | PRN

## 2016-11-05 MED ORDER — TRAZODONE HCL 100 MG PO TABS
100.0000 mg | ORAL_TABLET | Freq: Every day | ORAL | Status: DC
  Administered 2016-11-06: 100 mg via ORAL
  Filled 2016-11-05 (×2): qty 1

## 2016-11-05 MED ORDER — TRIAMCINOLONE ACETONIDE 0.1 % EX CREA
TOPICAL_CREAM | Freq: Two times a day (BID) | CUTANEOUS | Status: DC
  Administered 2016-11-06 – 2016-11-10 (×5): via TOPICAL
  Filled 2016-11-05: qty 15

## 2016-11-05 MED ORDER — RISPERIDONE 1 MG PO TABS
1.0000 mg | ORAL_TABLET | Freq: Two times a day (BID) | ORAL | Status: DC
  Filled 2016-11-05: qty 1
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
## 2016-11-05 NOTE — ED Notes (Signed)
Pt to be transferred to [facility] by wheelchair with police escort. No acute distress noted. Pt belongings returned, handed off on transfer.

## 2016-11-05 NOTE — ED Notes (Signed)
Pt awake in bed eating breakfast. States "i'm so tired." Continues to endorse SI with no particular plan at this time, verbally contracts for safety. Will not elaborate on stressors, reasons for suicidal ideation at this time. Safety maintained with every 15 minute checks and security cameras in place. Will continue to monitor.

## 2016-11-05 NOTE — ED Notes (Signed)
During medication administration, pt refuses Risperdal because "it makes me sleepy and I'm already sleepy." MD informed. Safety maintained. Will continue to monitor.

## 2016-11-05 NOTE — ED Notes (Signed)
Pt asleep in bed with even, unlabored respirations noted. No acute distress noted. Safety maintained with every 15 minute checks and security cameras in place. Will continue to monitor. 

## 2016-11-05 NOTE — ED Provider Notes (Addendum)
-----------------------------------------   7:56 AM on 11/05/2016 -----------------------------------------   Blood pressure (!) 82/49, pulse (!) 56, temperature 97.6 F (36.4 C), temperature source Oral, resp. rate 18, height 5\' 5"  (1.651 m), weight 150 lb (68 kg), last menstrual period 10/20/2016, SpO2 100 %.  The patient had no acute events since last update.  Calm and cooperative at this time.   Psychiatric evaluation pending. RPR is negative. Patient remains under involuntary commitment until she can be psychiatrically evaluated.   ----------------------------------------- 1:07 PM on 11/05/2016 -----------------------------------------  Patient has been seen and evaluated by psychiatry. She'll be admitted to their service for further treatment once a bed is available.    Minna AntisKevin Shermar Friedland, MD 11/05/16 [id number]    Minna AntisKevin Dorrine Montone, MD 11/05/16 1308

## 2016-11-05 NOTE — ED Notes (Signed)
Pt being discharged and readmitted to [facility] room 316. Report called to Bella VistaGwenn, Rn. Will transfer pt.

## 2016-11-05 NOTE — ED Notes (Signed)
Pt asleep in room with even, unlabored respirations. No acute distress noted. Safety maintained with every 15 minute checks and security cameras in place. Will continue to monitor.  

## 2016-11-05 NOTE — BH Assessment (Signed)
[facility] by Dr. Toni Amendlapacs.  Attending Physician will be Dr. Ardyth HarpsHernandez.   Patient [facility] HospersGwen F.   Intake Paper Work has been signed and placed on patient chart.  ER staff is aware of the admission Misty Stanley(Lisa, ER Sect.; Dr. Lenard LancePaduchowski, ER MD; Marchelle FolksAmanda, Patient's Nurse & Byrd HesselbachMaria, Patient Access).

## 2016-11-05 NOTE — Progress Notes (Addendum)
Admission Note:  D: Pt appeared depressed  With  a flat affect.   Patient has passive  Thoughts of suicide .  Increase depression  For several months. Plan to overdose Noncompliant with medication. Pt is redirectable and cooperative with assessment.      A: Pt admitted to unit per protocol, skin assessment  Form of  Over entire body heavy on back and lower legs  and search done and no contraband found.  Pt  educated on therapeutic milieu rules. Pt was introduced to milieu by nursing staff.  Patient  Is wearing a bractlet for house arrest  But it doesn't have battery  R: Pt was receptive to education about the milieu .  15 min safety checks started. Clinical research associatewriter offered support

## 2016-11-05 NOTE — ED Notes (Signed)
Food/fluids provided. Pt forwards little. No acute distress noted. Calm and cooperative. Safety maintained. Will continue to monitor.

## 2016-11-05 NOTE — ED Notes (Signed)
Pt woke my Dr. Toni Amendlapacs for assessment. Cooperative. Per Dr. Toni Amendlapacs, pt will be admitted to [facility]. No acute distress noted. Safety maintained with every 15 minute checks and security cameras in place. Will continue to monitor.

## 2016-11-05 NOTE — Plan of Care (Signed)
Problem: Education: Goal: Knowledge of [facility] Education information/materials will improve Outcome: Not Progressing Will continue to review

## 2016-11-05 NOTE — ED Notes (Signed)
Pt in room awake and resting. Calm and cooperative. No acute distress noted. Safety maintained with every 15 minute checks and security cameras in place. Will continue to monitor.

## 2016-11-05 NOTE — BH Assessment (Signed)
Writer spoke with patient for an updated assessment, she continues to voice SI with plan to overdose.

## 2016-11-05 NOTE — ED Notes (Signed)
Pt asleep in bed with even, unlabored respirations noted. No acute distress noted. Safety maintained with every 15 minute checks and security cameras in place. Will continue to monitor.

## 2016-11-05 NOTE — ED Notes (Signed)
Pt in room awake and resting. Calm and cooperative. No acute distress noted. Safety maintained with every 15 minute checks and security cameras in place. Will continue to monitor. 

## 2016-11-05 NOTE — Tx Team (Signed)
Initial Treatment Plan 11/05/2016 6:32 PM Bridget [place] C Buckwalter [id number]    PATIENT STRESSORS: Health problems Medication change or noncompliance Substance abuse   PATIENT STRENGTHS: Ability for insight Average or above average intelligence Communication skills Supportive family/friends   PATIENT IDENTIFIED PROBLEMS: Depression  11/05/16  Suicide 11/05/16                   DISCHARGE CRITERIA:  Ability to meet basic life and health needs Improved stabilization in mood, thinking, and/or behavior Medical problems require only outpatient monitoring  PRELIMINARY DISCHARGE PLAN: Outpatient therapy Return to previous living arrangement  PATIENT/FAMILY INVOLVEMENT: This treatment plan has been presented to and reviewed with the patient, Bridget Carpenter, and/or family member,  .  The patient and family have been given the opportunity to ask questions and make suggestions.  Crist InfanteGwen A Kellen Hover, RN 11/05/2016, 6:32 PM

## 2016-11-05 NOTE — ED Notes (Signed)
Pt asleep in room, even, unlabored respirations noted. No acute distress noted. Safety maintained with security cameras in place and every 15 minute checks. Will continue to monitor.

## 2016-11-05 NOTE — ED Notes (Signed)
Pt asleep in bed with even, unlabored respirations noted. No acute distress noted. Food/fluids provided, pt continues to sleep. Safety maintained with security cameras in place and every 15 minute checks. Will continue to monitor.

## 2016-11-06 DIAGNOSIS — F1420 Cocaine dependence, uncomplicated: Secondary | ICD-10-CM

## 2016-11-06 DIAGNOSIS — F329 Major depressive disorder, single episode, unspecified: Secondary | ICD-10-CM

## 2016-11-06 DIAGNOSIS — F603 Borderline personality disorder: Secondary | ICD-10-CM

## 2016-11-06 DIAGNOSIS — F1010 Alcohol abuse, uncomplicated: Secondary | ICD-10-CM

## 2016-11-06 DIAGNOSIS — F332 Major depressive disorder, recurrent severe without psychotic features: Secondary | ICD-10-CM | POA: Diagnosis present

## 2016-11-06 DIAGNOSIS — F1220 Cannabis dependence, uncomplicated: Secondary | ICD-10-CM

## 2016-11-06 LAB — URINE DRUG SCREEN, QUALITATIVE (ARMC ONLY)
Amphetamines, Ur Screen: NOT DETECTED
BENZODIAZEPINE, UR SCRN: NOT DETECTED
Barbiturates, Ur Screen: NOT DETECTED
CANNABINOID 50 NG, UR ~~LOC~~: NOT DETECTED
Cocaine Metabolite,Ur ~~LOC~~: POSITIVE — AB
MDMA (Ecstasy)Ur Screen: NOT DETECTED
Methadone Scn, Ur: NOT DETECTED
Opiate, Ur Screen: NOT DETECTED
PHENCYCLIDINE (PCP) UR S: NOT DETECTED
Tricyclic, Ur Screen: NOT DETECTED

## 2016-11-06 LAB — LIPID PANEL
CHOL/HDL RATIO: 1.6 ratio
Cholesterol: 133 mg/dL (ref 0–200)
HDL: 84 mg/dL (ref >40)
LDL CALC: 33 mg/dL (ref 0–99)
Triglycerides: 80 mg/dL (ref <150)
VLDL: 16 mg/dL (ref 0–40)

## 2016-11-06 LAB — TSH: TSH: 2.258 u[IU]/mL (ref 0.350–4.500)

## 2016-11-06 MED ORDER — MIRTAZAPINE 15 MG PO TABS
15.0000 mg | ORAL_TABLET | Freq: Every day | ORAL | Status: DC
  Administered 2016-11-07 – 2016-11-08 (×2): 15 mg via ORAL
  Filled 2016-11-06 (×2): qty 1

## 2016-11-06 NOTE — Progress Notes (Signed)
D: Pt denies SI/HI/AVH. Pt is irritable, hostile to staff and unwilling to participate in treatment plan .Patiet's affect is blunted.she appears anxious and she is not interacting with peers and staff appropriately.  A: Pt was offered support and encouragement. Pt was given scheduled medications. Pt was encouraged to attend groups. Q 15 minute checks were done for safety.  R:Pt did not attend group. Pt is not complaint with  Medication. Pt is not receptive to treatment on the unit.  Safety maintained on unit, will continue to monitor.

## 2016-11-06 NOTE — Plan of Care (Signed)
Problem: Education: Goal: Ability to make informed decisions regarding treatment will improve Outcome: Not Progressing Patient is not to participate in treatment plan.

## 2016-11-06 NOTE — Progress Notes (Signed)
Urine sample obtained with results pending.  

## 2016-11-06 NOTE — [facility] Suicide Risk Assessment (Signed)
[facility] INPATIENT:  Family/Significant Other Suicide Prevention Education  Suicide Prevention Education:  Patient Refusal for Family/Significant Other Suicide Prevention Education: The patient [name] Milana KidneyHoover has refused to provide written consent for family/significant other to be provided Family/Significant Other Suicide Prevention Education during admission and/or prior to discharge.  Physician notified.  Lynden OxfordKadijah R Geetika Laborde, MSW, LCSW-A 11/06/2016, 11:48 AM

## 2016-11-06 NOTE — Progress Notes (Addendum)
Patient with depressed affect, withdrawn behavior. Minimal interaction with peers, eats meal at table by herself. Quiet speech, poor eye contact with staff. Patient refuses am Risperdal 1 mg po. Returns to bed to sleep. No SI/HI/AVH at this time. Safety maintained. Therapy groups encouraged.

## 2016-11-06 NOTE — Progress Notes (Signed)
Recreation Therapy Notes  Date: 11.09.17 Time: 9:30 am Location: Craft Room  Group Topic: Leisure Education  Goal Area(s) Addresses:  Patient will identify activities for each letter of the alphabet. Patient will verbalize ability to integrate positive leisure into life post d/c. Patient will verbalize ability to use leisure as a Associate Professorcoping skill.  Behavioral Response: Did not attend  Intervention: Leisure Alphabet  Activity: Patients were given a Leisure Information systems managerAlphabet worksheet and were instructed to write healthy leisure activities for each letter of the alphabet.  Education: LRT educated patients on what they need to participate in leisure.  Education Outcome: Patient did not attend group.  Clinical Observations/Feedback: Patient did not attend group.  Jacquelynn CreeGreene,Zymire Turnbo M, LRT/CTRS 11/06/2016 10:26 AM

## 2016-11-06 NOTE — [facility] Suicide Risk Assessment (Signed)
[facility]   Nursing information obtained from:    Demographic factors:    Current Mental Status:    Loss Factors:    Historical Factors:    Risk Reduction Factors:     Total Time spent with patient: Principal Problem: Depression Diagnosis:   Patient Active Problem List   Diagnosis Date Noted  . Borderline personality disorder [F60.3] 11/06/2016  . Cocaine use disorder, severe, dependence (HCC) [F14.20] 11/06/2016  . Cannabis use disorder, moderate, dependence (HCC) [F12.20] 11/06/2016  . Alcohol use disorder, mild, abuse [F10.10] 11/06/2016  . Unspecified depressive disorder [F32.9] 11/06/2016  . 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 Decreased hearing [H91.90] 10/31/2011  . Asthma [J45.909] 09/23/2011  . Tobacco use disorder [F17.200] 09/15/2009   Subjective Data:   Continued Clinical Symptoms:  Alcohol Use Disorder Identification Test Final Score (AUDIT): 3 The "Alcohol Use Disorders Identification Test", Guidelines for Use in Primary Care, Second Edition.  World Science writerHealth Organization [facility](WHO). Score between 0-7:  no or low risk or alcohol related problems. Score between 8-15:  moderate risk of alcohol related problems. Score between 16-19:  high risk of alcohol related problems. Score 20 or above:  warrants further diagnostic evaluation for alcohol dependence and treatment.   CLINICAL FACTORS:   Alcohol/Substance Abuse/Dependencies Personality Disorders:   Cluster B Comorbid alcohol abuse/dependence Comorbid depression Previous Psychiatric Diagnoses and Treatments     Psychiatric Specialty Exam: Physical Exam  ROS  Blood pressure 103/65, pulse 69, temperature 98.2 F (36.8 C), temperature source Oral, resp. rate 16, height 5\' 6"  (1.676 m), weight 67.1 kg (148 lb), last menstrual period 10/20/2016, SpO2 100 %.Body mass index is 23.89 kg/m.                                                     Sleep:  Number of Hours: 6      COGNITIVE FEATURES THAT CONTRIBUTE TO RISK:  Polarized thinking    SUICIDE RISK:   Moderate:  Frequent suicidal ideation with limited intensity, and duration, some specificity in terms of plans, no associated intent, good self-control, limited dysphoria/symptomatology, some risk factors present, and identifiable protective factors, including available and accessible social support.   [facility]  I certify that inpatient services furnished can reasonably be expected to improve the patient's condition.  [name],  Ryka Beighley, MD 11/06/2016, 11:55 AM
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
## 2016-11-06 NOTE — [facility] Group Notes (Signed)
[facility] LCSW Group Therapy  11/06/2016 2:35 PM  Type of Therapy:  Group Therapy  Participation Level:  Patient attended group but did not participate during group.   Summary of Progress/Problems:Balance in life: Patients will discuss the concept of balance and how it looks and feels to be unbalanced. Pt will identify areas in their life that is unbalanced and ways to become more balanced.    Adrena Nakamura G. Garnette CzechSampson MSW, LCSWA 11/06/2016, 2:36 PM

## 2016-11-06 NOTE — [facility] Group Notes (Signed)
[facility] LCSW Group Therapy Note  Type of Therapy and Topic:  Group Therapy:  Goals Group: SMART Goals  Participation Level:  Patient did not attend group. CSW invited patient to group.   Description of Group:   The purpose of a daily goals group is to assist and guide patients in setting recovery/wellness-related goals.  The objective is to set goals as they relate to the crisis in which they were admitted. Patients will be using SMART goal modalities to set measurable goals.  Characteristics of realistic goals will be discussed and patients will be assisted in setting and processing how one will reach their goal. Facilitator will also assist patients in applying interventions and coping skills learned in psycho-education groups to the SMART goal and process how one will achieve defined goal.  Therapeutic Goals: -Patients will develop and document one goal related to or their crisis in which brought them into treatment. -Patients will be guided by LCSW using SMART goal setting modality in how to set a measurable, attainable, realistic and time sensitive goal.  -Patients will process barriers in reaching goal. -Patients will process interventions in how to overcome and successful in reaching goal.   Summary of Patient Progress:  Patient Goal: Patient did not attend group. CSW invited patient to group.    Therapeutic Modalities:   Motivational Interviewing Engineer, manufacturing systemsCognitive Behavioral Therapy Crisis Intervention Model SMART goals setting  Rutledge Selsor G. Garnette CzechSampson MSW, LCSWA 11/06/2016 11:04 AM

## 2016-11-06 NOTE — [facility] Group Notes (Signed)
[facility] Group Notes:  (Nursing/MHT/Case Management/Adjunct)  Date:  11/06/2016  Time:  4:27 AM  Type of Therapy:  Psychoeducational Skills  Participation Level:  Did Not Attend  Summary of Progress/Problems:  Bridget Carpenter Y Bridget Carpenter 11/06/2016, 4:27 AM 

## 2016-11-07 DIAGNOSIS — F332 Major depressive disorder, recurrent severe without psychotic features: Principal | ICD-10-CM

## 2016-11-07 LAB — HEMOGLOBIN A1C
HEMOGLOBIN A1C: 5.1 % (ref 4.8–5.6)
MEAN PLASMA GLUCOSE: 100 mg/dL

## 2016-11-07 LAB — PROLACTIN: PROLACTIN: 21.5 ng/mL (ref 4.8–23.3)

## 2016-11-07 MED ORDER — SULFAMETHOXAZOLE-TRIMETHOPRIM 800-160 MG PO TABS
1.0000 | ORAL_TABLET | Freq: Three times a day (TID) | ORAL | Status: DC
  Administered 2016-11-07 – 2016-11-10 (×9): 1 via ORAL
  Filled 2016-11-07 (×11): qty 1

## 2016-11-07 MED ORDER — TRAZODONE HCL 100 MG PO TABS
100.0000 mg | ORAL_TABLET | Freq: Every evening | ORAL | Status: DC | PRN
  Administered 2016-11-08: 100 mg via ORAL
  Filled 2016-11-07: qty 1

## 2016-11-07 NOTE — Plan of Care (Signed)
Problem: Safety: Goal: Ability to disclose and discuss suicidal ideas will improve Outcome: Progressing Patient endorses SI but contracts for safety   

## 2016-11-07 NOTE — Progress Notes (Signed)
CH visited PT in outdoor courtyard. PT was entertained by music but gladly let CH participate in music selection and conversation. PT expresses anxiety about being at the hospital and desire for answers. PT talked about medicine switches and then began to get anxious about new medicine she will begin tonight. She is hoping it will help her sleep. Overall PT was pleasant and willing to engage conversation.

## 2016-11-07 NOTE — Plan of Care (Signed)
Problem: Safety: Goal: Ability to remain free from injury will improve Outcome: Progressing Patient reports that although she hears voices that tell her that she is does not deserve to live, patient denies thoughts of self harm

## 2016-11-07 NOTE — Progress Notes (Signed)
D:  Per pt self inventory pt reports sleeping poor, appetite fair, energy level , ability to pay attention low, rates depression at a 10 out of 10, hopelessness at a 10 out of 10, anxiety at a 10 out of 10, denies HI/VH, endorses SI, contracts for safety, endorses AH, goal today: "Trying to regain important relationships with persons I've disappointed, being open, honest and by changing my bad lifestyle", anxious during interaction, responding to internal stimuli, thought blocking/response lag, paranoia.     A:  Emotional support provided, Encouraged pt to continue with treatment plan and attend all group activities, q15 min checks maintained for safety.  R:  Pt is receptive, going to groups, pleasant and cooperative with staff and other patients on the unit.

## 2016-11-07 NOTE — Progress Notes (Signed)
Recreation Therapy Notes  Date: 11.10.17 Time: 9:30 am Location: Craft Room  Group Topic: Coping Skills  Goal Area(s) Addresses:  Patient will participate in healthy coping skill. Patient will verbalize benefit of using art as a coping skill.  Behavioral Response: Did not attend  Intervention: Coloring  Activity: Patients were given coloring sheets to color and were instructed to think about what emotions they were feeling as well as what their minds were focused on.  Education: LRT educated patients on healthy coping skills.  Education Outcome: Patient did not attend group.  Clinical Observations/Feedback: Patient did not attend group.   Jacquelynn CreeGreene,Dereon Williamsen M, LRT/CTRS 11/07/2016 10:22 AM

## 2016-11-07 NOTE — Progress Notes (Signed)
[facility] MD Progress Note  11/07/2016 4:17 PM [name] S[place]  MRN:  [id number]  Subjective:  Ms. Excell SeltzerCooper is a 25 year old female with history of depression, mood, and substance abuse who is well known to us. She returns with suicidal ideation in the context of relapse on substances and treatment noncompliance.  Today the patient is very pleasant. She recognizes me from previous admissions. She is still depressed and suicidal. She admits to using substances and wants treatment. She however refuses to take any medications for depression even though she was admitted for suicidal ideation. She is scheduled to receive Remeron tonight and promised to take it. She feels over sedated from trazodone and no longer is interested in taking trazodone. We'll change it to when necessary. She has eczema that she believes improves with treatment with Bactrim. This is not the first time she is asking for "highest dose of Bactrim 4 times a day, or at least 3 times a day". I will try to prescribe Bactrim 3 times a day I'm not sure if the pharmacy will allow me to do that.  Principal Problem: Depression Diagnosis:   Patient Active Problem List   Diagnosis Date Noted  . Borderline personality disorder [F60.3] 11/06/2016  . Cocaine use disorder, severe, dependence (HCC) [F14.20] 11/06/2016  . Cannabis use disorder, moderate, dependence (HCC) [F12.20] 11/06/2016  . Alcohol use disorder, mild, abuse [F10.10] 11/06/2016  . Unspecified depressive disorder [F32.9] 11/06/2016  . 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 Decreased hearing [H91.90] 10/31/2011  . Asthma [J45.909] 09/23/2011  . Tobacco use disorder [F17.200] 09/15/2009   Total Time spent with patient: 20 minutes  Past Psychiatric History: Depression, substance use, suicidal ideation. Family  Past Medical History:  Past Medical History:  Diagnosis Date  . Asthma   . Depression 2009   Inpatient psych admission for  SI, dissociative fugue  . Dissociative disorder or reaction 2009  . Eczema   . H/O: suicide attempt   . ODD (oppositional defiant disorder)   . PTSD (post-traumatic stress disorder)   . Schizoaffective disorder (HCC)   . Substance abuse     Past Surgical History:  Procedure Laterality Date  . TONSILLECTOMY     Family History:  Family History  Problem Relation Age of Onset  . Adopted: Yes   Family Psychiatric  History: See H&P. Social History:  History  Alcohol Use  . Yes    Comment: sometimes     History  Drug Use No    Social History   Social History  . Marital status: Single    Spouse name: N/A  . Number of children: N/A  . Years of education: N/A   Social History Main Topics  . Smoking status: Current Every Day Smoker    Packs/day: 0.50    Years: 2.00    Types: Cigarettes  . Smokeless tobacco: Never Used     Comment: "I decided to quit today" (11/04/16)  . Alcohol use Yes     Comment: sometimes  . Drug use: No  . Sexual activity: Yes    Birth control/ protection: Condom   Other Topics Concern  . None   Social History Narrative  . None   Additional Social History:                         Sleep: Fair  Appetite:  Fair  Current Medications: Current Facility-Administered Medications  Medication Dose Route Frequency Provider Last Rate Last  Dose  . acetaminophen (TYLENOL) tablet 650 mg  650 mg Oral Q6H PRN Audery AmelJohn T Clapacs, MD      . alum & mag hydroxide-simeth (MAALOX/MYLANTA) 200-200-20 MG/5ML suspension 30 mL  30 mL Oral Q4H PRN Audery AmelJohn T Clapacs, MD      . magnesium hydroxide (MILK OF MAGNESIA) suspension 30 mL  30 mL Oral Daily PRN Audery AmelJohn T Clapacs, MD      . mirtazapine (REMERON) tablet 15 mg  15 mg Oral QHS Jimmy FootmanAndrea Hernandez-Gonzalez, MD      . sulfamethoxazole-trimethoprim (BACTRIM DS,SEPTRA DS) 800-160 MG per tablet 1 tablet  1 tablet Oral Q8H Jahayra Mazo B Wendy Hoback, MD      . traZODone (DESYREL) tablet 100 mg  100 mg Oral QHS PRN Jocilynn Grade B  Delois Tolbert, MD      . triamcinolone cream (KENALOG) 0.1 %   Topical BID Audery AmelJohn T Clapacs, MD        Lab Results:  Results for orders placed or performed during the hospital encounter of 11/05/16 (from the past 48 hour(s))  Hemoglobin A1c     Status: None   Collection Time: 11/06/16  7:11 AM  Result Value Ref Range   Hgb A1c MFr Bld 5.1 4.8 - 5.6 %    Comment: (NOTE)         Pre-diabetes: 5.7 - 6.4         Diabetes: >6.4         Glycemic control for adults with diabetes: <7.0    Mean Plasma Glucose 100 mg/dL    Comment: (NOTE) Performed At: [facility] [address] [place], [place] [id number] Mila HomerHancock William F MD [phone]   Lipid panel     Status: None   Collection Time: 11/06/16  7:11 AM  Result Value Ref Range   Cholesterol 133 0 - 200 mg/dL   Triglycerides 80 <244<150 mg/dL   HDL 84 >01>40 mg/dL   Total CHOL/HDL Ratio 1.6 RATIO   VLDL 16 0 - 40 mg/dL   LDL Cholesterol 33 0 - 99 mg/dL    Comment:        Total Cholesterol/HDL:CHD Risk Coronary Heart Disease Risk Table                     Men   Women  1/2 Average Risk   3.4   3.3  Average Risk       5.0   4.4  2 X Average Risk   9.6   7.1  3 X Average Risk  23.4   11.0        Use the calculated Patient Ratio above and the CHD Risk Table to determine the patient's CHD Risk.        ATP III CLASSIFICATION (LDL):  <100     mg/dL   Optimal  [id number]  mg/dL   Near or Above                    Optimal  130-159  mg/dL   Borderline  [id number]  mg/dL   High  >474>190     mg/dL   Very High   Prolactin     Status: None   Collection Time: 11/06/16  7:11 AM  Result Value Ref Range   Prolactin 21.5 4.8 - 23.3 ng/mL    Comment: (NOTE) Performed At: [facility] [address] [place], [place] [id number] Mila HomerHancock William F MD [phone]   TSH     Status: None   Collection  Time: 11/06/16  7:11 AM  Result Value Ref Range   TSH 2.258 0.350 - 4.500 uIU/mL    Comment: Performed by a 3rd Generation assay with a  functional sensitivity of <=0.01 uIU/mL.  Urine Drug Screen, Qualitative ([facility] only)     Status: Abnormal   Collection Time: 11/06/16  5:08 PM  Result Value Ref Range   Tricyclic, Ur Screen NONE DETECTED NONE DETECTED   Amphetamines, Ur Screen NONE DETECTED NONE DETECTED   MDMA (Ecstasy)Ur Screen NONE DETECTED NONE DETECTED   Cocaine Metabolite,Ur [place] POSITIVE (A) NONE DETECTED   Opiate, Ur Screen NONE DETECTED NONE DETECTED   Phencyclidine (PCP) Ur S NONE DETECTED NONE DETECTED   Cannabinoid 50 Ng, Ur [place] NONE DETECTED NONE DETECTED   Barbiturates, Ur Screen NONE DETECTED NONE DETECTED   Benzodiazepine, Ur Scrn NONE DETECTED NONE DETECTED   Methadone Scn, Ur NONE DETECTED NONE DETECTED    Comment: (NOTE) 100  Tricyclics, urine               Cutoff 1000 ng/mL 200  Amphetamines, urine             Cutoff 1000 ng/mL 300  MDMA (Ecstasy), urine           Cutoff 500 ng/mL 400  Cocaine Metabolite, urine       Cutoff 300 ng/mL 500  Opiate, urine                   Cutoff 300 ng/mL 600  Phencyclidine (PCP), urine      Cutoff 25 ng/mL 700  Cannabinoid, urine              Cutoff 50 ng/mL 800  Barbiturates, urine             Cutoff 200 ng/mL 900  Benzodiazepine, urine           Cutoff 200 ng/mL 1000 Methadone, urine                Cutoff 300 ng/mL 1100 1200 The urine drug screen provides only a preliminary, unconfirmed 1300 analytical test result and should not be used for non-medical 1400 purposes. Clinical consideration and professional judgment should 1500 be applied to any positive drug screen result due to possible 1600 interfering substances. A more specific alternate chemical method 1700 must be used in order to obtain a confirmed analytical result.  1800 Gas chromato graphy / mass spectrometry (GC/MS) is the preferred 1900 confirmatory method.     Blood Alcohol level:  Lab Results  Component Value Date   ETH <5 11/04/2016   ETH <5 08/13/2016    Metabolic Disorder Labs: Lab  Results  Component Value Date   HGBA1C 5.1 11/06/2016   MPG 100 11/06/2016   Lab Results  Component Value Date   PROLACTIN 21.5 11/06/2016   PROLACTIN 49.4 (H) 08/13/2016   Lab Results  Component Value Date   CHOL 133 11/06/2016   TRIG 80 11/06/2016   HDL 84 11/06/2016   CHOLHDL 1.6 11/06/2016   VLDL 16 11/06/2016   LDLCALC 33 11/06/2016   LDLCALC 32 08/13/2016    Physical Findings: AIMS:  , ,  ,  ,    CIWA:    COWS:     Musculoskeletal: Strength & Muscle Tone: within normal limits Gait & Station: normal Patient leans: N/A  Psychiatric Specialty Exam: Physical Exam  Nursing note and vitals reviewed.   Review of Systems  Skin: Positive for rash.  Psychiatric/Behavioral: Positive for  depression, substance abuse and suicidal ideas.  All other systems reviewed and are negative.   Blood pressure 109/64, pulse 66, temperature 98 F (36.7 C), temperature source Oral, resp. rate 18, height 5\' 6"  (1.676 m), weight 67.1 kg (148 lb), last menstrual period 10/20/2016, SpO2 100 %.Body mass index is 23.89 kg/m.  General Appearance: Casual  Eye Contact:  Good  Speech:  Clear and Coherent  Volume:  Normal  Mood:  Anxious  Affect:  Appropriate  Thought Process:  Goal Directed and Descriptions of Associations: Intact  Orientation:  Full (Time, Place, and Person)  Thought Content:  WDL  Suicidal Thoughts:  Yes.  with intent/plan  Homicidal Thoughts:  No  Memory:  Immediate;   Fair Recent;   Fair Remote;   Fair  Judgement:  Poor  Insight:  Lacking  Psychomotor Activity:  Normal  Concentration:  Concentration: Fair and Attention Span: Fair  Recall:  [employer] of Knowledge:  Fair  Language:  Fair  Akathisia:  No  Handed:  Right  AIMS (if indicated):     Assets:  Communication Skills Desire for Improvement Physical Health Resilience Social Support  ADL's:  Intact  Cognition:  WNL  Sleep:  Number of Hours: 6.5     Treatment Plan Summary: Daily contact with  patient to assess and evaluate symptoms and progress in treatment and Medication management    Ms. Cockrum is a 25 year old female with history of depression, mood instability, and substance abuse admitted for suicidal ideation in the context of treatment noncompliance and relapse on substances.   Unspecified depressive disorder, rule out cocaine-induced depressive disorder in the setting of withdrawal: Patient will be started on mirtazapine 15 mg by mouth daily at bedtime to help with mood and also with insomnia.  Insomnia patient has orders for trazodone daily at bedtime as needed  Cocaine use disorder, alcohol use disorder, cannabis use disorder rule out opioid use disorder: Patient has been very resistant to receive substance abuse treatment per documentation in prior admissions. This time however the patient says that she feels substance abuse is her main issue and she is interested in receiving substance abuse treatment.  Patient has now reported the use of opiates however during a recent urine toxicology she was positive for opiates. I will try to obtain a urine sample today and completed a urine toxicology.  Tobacco use disorder patient declines from receiving a nicotine patch  Eczema patient has severe eczema that is generalized. I spoken with the pharmacist and requested for the patient to receive Kenalog jar. Will add Bactrim tid.  Asthma continue albuterol inhaler when necessary  Precautions every 15 minute checks  Diet regular  Vital signs daily  Hospitalization status will change to voluntary treatment  Disposition once a stable she will be discharged back to her mother's house  Follow up to be determined. Seems to be noncompliant with visits to [facility] [place]. She is interested in substance abuse.  Kristine Linea, MD 11/07/2016, 4:17 PM

## 2016-11-07 NOTE — Progress Notes (Signed)
D: Patient appears flat. States she wishes she wasn't born. Contracts for safety. Denies HI. Endorses auditory hallucinations. She's been visible in the milieu interacting with peers. Attended groups.  A: Medication given with education. Encouragement provided.  R: She was compliant with trazodone but did not want to take Remeron. Safety maintained with 15 min checks.

## 2016-11-07 NOTE — [facility] Group Notes (Signed)
[facility] LCSW Group Therapy  11/07/2016 2:27 PM  Type of Therapy:  Group Therapy  Participation Level:  Patient did not attend group. CSW invited patient to group.   Summary of Progress/Problems:Feelings around Relapse. Group members discussed the meaning of relapse and shared personal stories of relapse, how it affected them and others, and how they perceived themselves during this time. Group members were encouraged to identify triggers, warning signs and coping skills used when facing the possibility of relapse. Social supports were discussed and explored in detail. Patients also discussed facing disappointment and how that can trigger someone to relapse.   Gavino Fouch G. Garnette CzechSampson MSW, LCSWA 11/07/2016, 2:27 PM

## 2016-11-07 NOTE — Plan of Care (Signed)
Problem: Medication: Goal: Compliance with prescribed medication regimen will improve Outcome: Progressing Patient presented to the medication room and had her bedtime medications. Knowledgeable of her medication regime and taking all her medications voluntarily

## 2016-11-07 NOTE — Progress Notes (Signed)
2130: patient presented to the medication room to receive her bed time medications. Reported to this Clinical research associatewriter that she continues to hear voices that keep telling her that she has no reason to live, that she deserves to die. Patient was anxious when reporting that. Denied suicidal thoughts. Received her medications and stayed around before bedtime. Currently in bed resting. Emotional support provided. Safety precautions reinforced. Remains safe in room.

## 2016-11-07 NOTE — Tx Team (Signed)
Interdisciplinary Treatment and Diagnostic Plan Update  11/07/2016 Time of Session: 10:30 AM [place] C Sh[place]ing MRN: [id number]  Principal Diagnosis: Depression  Secondary Diagnoses: Principal Problem:   Unspecified depressive disorder Active Problems:   Tobacco use disorder   Asthma   Decreased hearing   Borderline personality disorder   Cocaine use disorder, severe, dependence (HCC)   Cannabis use disorder, moderate, dependence (HCC)   Alcohol use disorder, mild, abuse   Cocaine-induced psychotic disorder with hallucinations (HCC)   Current Medications:  Current Facility-Administered Medications  Medication Dose Route Frequency Provider Last Rate Last Dose  . acetaminophen (TYLENOL) tablet 650 mg  650 mg Oral Q6H PRN Audery AmelJohn T Clapacs, MD      . alum & mag hydroxide-simeth (MAALOX/MYLANTA) 200-200-20 MG/5ML suspension 30 mL  30 mL Oral Q4H PRN Audery AmelJohn T Clapacs, MD      . magnesium hydroxide (MILK OF MAGNESIA) suspension 30 mL  30 mL Oral Daily PRN Audery AmelJohn T Clapacs, MD      . mirtazapine (REMERON) tablet 15 mg  15 mg Oral QHS Jimmy FootmanAndrea Hernandez-Gonzalez, MD      . traZODone (DESYREL) tablet 100 mg  100 mg Oral QHS Audery AmelJohn T Clapacs, MD   100 mg at 11/06/16 2146  . triamcinolone cream (KENALOG) 0.1 %   Topical BID Audery AmelJohn T Clapacs, MD       PTA Medications: Prescriptions Prior to Admission  Medication Sig Dispense Refill Last Dose  . albuterol (PROVENTIL HFA;VENTOLIN HFA) 108 (90 BASE) MCG/ACT inhaler Inhale 2 puffs into the lungs every 6 (six) hours as needed for wheezing or shortness of breath. 1 Inhaler 2 prn at prn  . clindamycin (CLEOCIN) 300 MG capsule Take 1 capsule (300 mg total) by mouth every 8 (eight) hours. (Patient not taking: Reported on 11/04/2016) 42 capsule 0 Not Taking at Unknown time  . clobetasol ointment (TEMOVATE) 0.05 % Apply topically 2 (two) times daily. (Patient not taking: Reported on 11/04/2016) 30 g 0 Not Taking at Unknown time  . hydrOXYzine (ATARAX/VISTARIL) 25 MG  tablet Take 1 tablet (25 mg total) by mouth every 4 (four) hours as needed for anxiety. (Patient not taking: Reported on 11/04/2016) 90 tablet 1 Not Taking at Unknown time  . risperiDONE (RISPERDAL) 2 MG tablet Take 1 tablet (2 mg total) by mouth 2 (two) times daily. (Patient not taking: Reported on 11/04/2016) 60 tablet 1 Not Taking at Unknown time  . SEROQUEL XR 200 MG 24 hr tablet Take 200 mg by mouth at bedtime.   11/03/2016 at Unknown time  . sulfamethoxazole-trimethoprim (BACTRIM DS,SEPTRA DS) 800-160 MG tablet Take 2 tablets by mouth 2 (two) times daily. (Patient not taking: Reported on 11/04/2016) 40 tablet 0 Completed Course at Unknown time    Patient Stressors: Health problems Medication change or noncompliance Substance abuse  Patient Strengths: Ability for insight Average or above average intelligence Communication skills Supportive family/friends  Treatment Modalities: Medication Management, Group therapy, Case management,  1 to 1 session with clinician, Psychoeducation, Recreational therapy.   Physician Treatment Plan for Primary Diagnosis: Depression Long Term Goal(s): Improvement in symptoms so as ready for discharge Improvement in symptoms so as ready for discharge   Short Term Goals: Ability to identify changes in lifestyle to reduce recurrence of condition will improve Ability to verbalize feelings will improve Ability to disclose and discuss suicidal ideas Ability to demonstrate self-control will improve Ability to identify and develop effective coping behaviors will improve Compliance with prescribed medications will improve Ability to identify triggers associated  with substance abuse/mental health issues will improve Ability to identify changes in lifestyle to reduce recurrence of condition will improve Ability to identify and develop effective coping behaviors will improve Ability to identify triggers associated with substance abuse/mental health issues will  improve  Medication Management: Evaluate patient's response, side effects, and tolerance of medication regimen.  Therapeutic Interventions: 1 to 1 sessions, Unit Group sessions and Medication administration.  Evaluation of Outcomes: Progressing  Physician Treatment Plan for Secondary Diagnosis: Principal Problem:   Unspecified depressive disorder Active Problems:   Tobacco use disorder   Asthma   Decreased hearing   Borderline personality disorder   Cocaine use disorder, severe, dependence (HCC)   Cannabis use disorder, moderate, dependence (HCC)   Alcohol use disorder, mild, abuse   Cocaine-induced psychotic disorder with hallucinations (HCC)  Long Term Goal(s): Improvement in symptoms so as ready for discharge Improvement in symptoms so as ready for discharge   Short Term Goals: Ability to identify changes in lifestyle to reduce recurrence of condition will improve Ability to verbalize feelings will improve Ability to disclose and discuss suicidal ideas Ability to demonstrate self-control will improve Ability to identify and develop effective coping behaviors will improve Compliance with prescribed medications will improve Ability to identify triggers associated with substance abuse/mental health issues will improve Ability to identify changes in lifestyle to reduce recurrence of condition will improve Ability to identify and develop effective coping behaviors will improve Ability to identify triggers associated with substance abuse/mental health issues will improve     Medication Management: Evaluate patient's response, side effects, and tolerance of medication regimen.  Therapeutic Interventions: 1 to 1 sessions, Unit Group sessions and Medication administration.  Evaluation of Outcomes: Progressing   RN Treatment Plan for Primary Diagnosis: Depression Long Term Goal(s): Knowledge of disease and therapeutic regimen to maintain health will improve  Short Term Goals:  Ability to remain free from injury will improve, Ability to verbalize frustration and anger appropriately will improve, Ability to demonstrate self-control, Ability to participate in decision making will improve and Compliance with prescribed medications will improve  Medication Management: RN will administer medications as ordered by provider, will assess and evaluate patient's response and provide education to patient for prescribed medication. RN will report any adverse and/or side effects to prescribing provider.  Therapeutic Interventions: 1 on 1 counseling sessions, Psychoeducation, Medication administration, Evaluate responses to treatment, Monitor vital signs and CBGs as ordered, Perform/monitor CIWA, COWS, AIMS and Fall Risk screenings as ordered, Perform wound care treatments as ordered.  Evaluation of Outcomes: Progressing   LCSW Treatment Plan for Primary Diagnosis: Depression Long Term Goal(s): Safe transition to appropriate next level of care at discharge, Engage patient in therapeutic group addressing interpersonal concerns.  Short Term Goals: Engage patient in aftercare planning with referrals and resources, Increase social support, Increase emotional regulation and Facilitate acceptance of mental health diagnosis and concerns  Therapeutic Interventions: Assess for all discharge needs, 1 to 1 time with Social worker, Explore available resources and support systems, Assess for adequacy in community support network, Educate family and significant other(s) on suicide prevention, Complete Psychosocial Assessment, Interpersonal group therapy.  Evaluation of Outcomes: Progressing   Progress in Treatment: Attending groups: Yes. Participating in groups: Yes. Taking medication as prescribed: Yes. Toleration medication: Yes. Family/Significant other contact made: No, will contact:  Pt refused family contact. Patient understands diagnosis: Yes. Discussing patient identified  problems/goals with staff: Yes. Medical problems stabilized or resolved: Yes. Denies suicidal/homicidal ideation: Yes. Issues/concerns per patient self-inventory: No.  New problem(s) identified: No, Describe:  None.  New Short Term/Long Term Goal(s):  Discharge Plan or Barriers:   Reason for Continuation of Hospitalization: Anxiety Depression Suicidal ideation  Estimated Length of Stay: 3 days   Attendees: Patient: Bridget Carpenter 11/07/2016 12:02 PM  Physician: Dr. Kristine Linea, MD 11/07/2016 12:02 PM  Nursing: Shelia Media, RN 11/07/2016 12:02 PM  RN Care Manager: 11/07/2016 12:02 PM  Social Worker: Hampton Abbot, MSW, LCSW-A 11/07/2016 12:02 PM  Recreational Therapist: Hershal Coria, LRT, CTRS  11/07/2016 12:02 PM   Scribe for Treatment Team: Lynden Oxford, LCSWA 11/07/2016 12:02 PM

## 2016-11-07 NOTE — Progress Notes (Signed)
1945: Patient visible in the milieu. Alert and oriented, pleasant and cooperative. Denying suicidal and homicidal thoughts. Requested linen politely  to change her bed. No sign of distress at this moment. Patient was assisted as needed and emotional support offered. Staff continue to monitor for safety and other needs. Patient remains safe and calm on the unit.

## 2016-11-08 DIAGNOSIS — Z79899 Other long term (current) drug therapy: Secondary | ICD-10-CM

## 2016-11-08 DIAGNOSIS — F17210 Nicotine dependence, cigarettes, uncomplicated: Secondary | ICD-10-CM

## 2016-11-08 DIAGNOSIS — R45851 Suicidal ideations: Secondary | ICD-10-CM

## 2016-11-08 NOTE — Progress Notes (Signed)
[facility] MD Progress Note  11/08/2016 1:19 PM [name] C Rominger  MRN:  [id number]  Subjective:  Ms. Excell Seltzer is a 25 year old female with history of depression, mood, and substance abuse who is well known to [place]. She returns with suicidal ideation in the context of relapse on substances and treatment noncompliance.  Patient was seen in her room this morning and her chart was reviewed. Patient appeared drowsy and was not anymore to talk. She was mumbling mostly. Per staff patient has not been very communicative. She has been endorsing that she hears voices that tell her she has no reason to live and that she deserves to die. However patient denying any active suicidal thoughts and able to contract for safety on the unit. It appears that she is sleeping off her on drug withdrawal symptoms. Her urine drug screen was positive for cocaine.  Principal Problem: Major depressive disorder, recurrent severe without psychotic features (HCC) Diagnosis:   Patient Active Problem List   Diagnosis Date Noted  . Substance induced mood disorder (HCC) [F19.94] 11/07/2016  . Suicidal ideation [R45.851] 11/07/2016  . Borderline personality disorder [F60.3] 11/06/2016  . Cocaine use disorder, severe, dependence (HCC) [F14.20] 11/06/2016  . Cannabis use disorder, moderate, dependence (HCC) [F12.20] 11/06/2016  . Alcohol use disorder, mild, abuse [F10.10] 11/06/2016  . Major depressive disorder, recurrent severe without psychotic features (HCC) [F33.2] 11/06/2016  . Cocaine-induced psychotic disorder with hallucinations (HCC) [N82.956] 11/06/2016  . Decreased hearing [H91.90] 10/31/2011  . Asthma [J45.909] 09/23/2011  . Tobacco use disorder [F17.200] 09/15/2009   Total Time spent with patient: 20 minutes  Past Psychiatric History: Depression, substance use, suicidal ideation. Family  Past Medical History:  Past Medical History:  Diagnosis Date  . Asthma   . Depression 2009   Inpatient psych admission for SI,  dissociative fugue  . Dissociative disorder or reaction 2009  . Eczema   . H/O: suicide attempt   . ODD (oppositional defiant disorder)   . PTSD (post-traumatic stress disorder)   . Schizoaffective disorder (HCC)   . Substance abuse     Past Surgical History:  Procedure Laterality Date  . TONSILLECTOMY     Family History:  Family History  Problem Relation Age of Onset  . Adopted: Yes   Family Psychiatric  History: See H&P. Social History:  History  Alcohol Use  . Yes    Comment: sometimes     History  Drug Use No    Social History   Social History  . Marital status: Single    Spouse name: N/A  . Number of children: N/A  . Years of education: N/A   Social History Main Topics  . Smoking status: Current Every Day Smoker    Packs/day: 0.50    Years: 2.00    Types: Cigarettes  . Smokeless tobacco: Never Used     Comment: "I decided to quit today" (11/04/16)  . Alcohol use Yes     Comment: sometimes  . Drug use: No  . Sexual activity: Yes    Birth control/ protection: Condom   Other Topics Concern  . None   Social History Narrative  . None   Additional Social History:                         Sleep: Fair  Appetite:  Fair  Current Medications: Current Facility-Administered Medications  Medication Dose Route Frequency Provider Last Rate Last Dose  . acetaminophen (TYLENOL) tablet 650 mg  650 mg  Oral Q6H PRN Audery AmelJohn T Clapacs, MD      . alum & mag hydroxide-simeth (MAALOX/MYLANTA) 200-200-20 MG/5ML suspension 30 mL  30 mL Oral Q4H PRN Audery AmelJohn T Clapacs, MD      . magnesium hydroxide (MILK OF MAGNESIA) suspension 30 mL  30 mL Oral Daily PRN Audery AmelJohn T Clapacs, MD      . mirtazapine (REMERON) tablet 15 mg  15 mg Oral QHS Jimmy FootmanAndrea Hernandez-Gonzalez, MD   15 mg at 11/07/16 2120  . sulfamethoxazole-trimethoprim (BACTRIM DS,SEPTRA DS) 800-160 MG per tablet 1 tablet  1 tablet Oral Q8H Shari ProwsJolanta B Pucilowska, MD   1 tablet at 11/08/16 0652  . traZODone (DESYREL) tablet  100 mg  100 mg Oral QHS PRN Jolanta B Pucilowska, MD      . triamcinolone cream (KENALOG) 0.1 %   Topical BID Audery AmelJohn T Clapacs, MD        Lab Results:  Results for orders placed or performed during the hospital encounter of 11/05/16 (from the past 48 hour(s))  Urine Drug Screen, Qualitative ([facility] only)     Status: Abnormal   Collection Time: 11/06/16  5:08 PM  Result Value Ref Range   Tricyclic, Ur Screen NONE DETECTED NONE DETECTED   Amphetamines, Ur Screen NONE DETECTED NONE DETECTED   MDMA (Ecstasy)Ur Screen NONE DETECTED NONE DETECTED   Cocaine Metabolite,Ur [place] POSITIVE (A) NONE DETECTED   Opiate, Ur Screen NONE DETECTED NONE DETECTED   Phencyclidine (PCP) Ur S NONE DETECTED NONE DETECTED   Cannabinoid 50 Ng, Ur [place] NONE DETECTED NONE DETECTED   Barbiturates, Ur Screen NONE DETECTED NONE DETECTED   Benzodiazepine, Ur Scrn NONE DETECTED NONE DETECTED   Methadone Scn, Ur NONE DETECTED NONE DETECTED    Comment: (NOTE) 100  Tricyclics, urine               Cutoff 1000 ng/mL 200  Amphetamines, urine             Cutoff 1000 ng/mL 300  MDMA (Ecstasy), urine           Cutoff 500 ng/mL 400  Cocaine Metabolite, urine       Cutoff 300 ng/mL 500  Opiate, urine                   Cutoff 300 ng/mL 600  Phencyclidine (PCP), urine      Cutoff 25 ng/mL 700  Cannabinoid, urine              Cutoff 50 ng/mL 800  Barbiturates, urine             Cutoff 200 ng/mL 900  Benzodiazepine, urine           Cutoff 200 ng/mL 1000 Methadone, urine                Cutoff 300 ng/mL 1100 1200 The urine drug screen provides only a preliminary, unconfirmed 1300 analytical test result and should not be used for non-medical 1400 purposes. Clinical consideration and professional judgment should 1500 be applied to any positive drug screen result due to possible 1600 interfering substances. A more specific alternate chemical method 1700 must be used in order to obtain a confirmed analytical result.  1800 Gas  chromato graphy / mass spectrometry (GC/MS) is the preferred 1900 confirmatory method.     Blood Alcohol level:  Lab Results  Component Value Date   [facility] 11/04/2016   ETH <5 08/13/2016    Metabolic Disorder Labs: Lab Results  Component Value  Date   HGBA1C 5.1 11/06/2016   MPG 100 11/06/2016   Lab Results  Component Value Date   PROLACTIN 21.5 11/06/2016   PROLACTIN 49.4 (H) 08/13/2016   Lab Results  Component Value Date   CHOL 133 11/06/2016   TRIG 80 11/06/2016   HDL 84 11/06/2016   CHOLHDL 1.6 11/06/2016   VLDL 16 11/06/2016   LDLCALC 33 11/06/2016   LDLCALC 32 08/13/2016    Physical Findings: AIMS:  , ,  ,  ,    CIWA:    COWS:     Musculoskeletal: Strength & Muscle Tone: within normal limits Gait & Station: normal Patient leans: N/A  Psychiatric Specialty Exam: Physical Exam  Nursing note and vitals reviewed.   Review of Systems  Skin: Positive for rash.  Psychiatric/Behavioral: Positive for depression, substance abuse and suicidal ideas.  All other systems reviewed and are negative.   Blood pressure 107/69, pulse 68, temperature 97.9 F (36.6 C), resp. rate 18, height 5\' 6"  (1.676 m), weight 148 lb (67.1 kg), last menstrual period 10/20/2016, SpO2 100 %.Body mass index is 23.89 kg/m.  General Appearance: Casual  Eye Contact:  Good  Speech:  Clear and Coherent  Volume:  Normal  Mood:  Anxious  Affect:  Appropriate  Thought Process:  Goal Directed and Descriptions of Associations: Intact  Orientation:  Full (Time, Place, and Person)  Thought Content:  WDL  Suicidal Thoughts:  Yes.  with intent/plan  Homicidal Thoughts:  No  Memory:  Immediate;   Fair Recent;   Fair Remote;   Fair  Judgement:  Poor  Insight:  Lacking  Psychomotor Activity:  Normal  Concentration:  Concentration: Fair and Attention Span: Fair  Recall:  [employer] of Knowledge:  Fair  Language:  Fair  Akathisia:  No  Handed:  Right  AIMS (if indicated):     Assets:   Communication Skills Desire for Improvement Physical Health Resilience Social Support  ADL's:  Intact  Cognition:  WNL  Sleep:  Number of Hours: 7.15     Treatment Plan Summary: Daily contact with patient to assess and evaluate symptoms and progress in treatment and Medication management    Ms. Jaskulski is a 25 year old female with history of depression, mood instability, and substance abuse admitted for suicidal ideation in the context of treatment noncompliance and relapse on substances.   Unspecified depressive disorder, rule out cocaine-induced depressive disorder in the setting of withdrawal: Patient will be started on mirtazapine 15 mg by mouth daily at bedtime to help with mood and also with insomnia.  Insomnia patient has orders for trazodone daily at bedtime as needed  Cocaine use disorder, alcohol use disorder, cannabis use disorder rule out opioid use disorder: Patient has been very resistant to receive substance abuse treatment per documentation in prior admissions. This time however the patient says that she feels substance abuse is her main issue and she is interested in receiving substance abuse treatment.  Patient has now reported the use of opiates however during a recent urine toxicology she was positive for opiates. I will try to obtain a urine sample today and completed a urine toxicology.  Tobacco use disorder patient declines from receiving a nicotine patch  Eczema patient has severe eczema that is generalized. I spoken with the pharmacist and requested for the patient to receive Kenalog jar. Will add Bactrim tid.  Asthma continue albuterol inhaler when necessary  Precautions every 15 minute checks  Diet regular  Vital signs daily  Hospitalization  status will change to voluntary treatment  Disposition once a stable she will be discharged back to her mother's house  Follow up to be determined. Seems to be noncompliant with [facility] [place]. She is interested in substance abuse.  Patrick NorthAVI, Elvie Maines, MD 11/08/2016, 1:19 PM

## 2016-11-08 NOTE — Plan of Care (Signed)
Problem: Coping: Goal: Ability to cope will improve Outcome: Not Progressing Pt continues to endorse SI/AH. Does not attend groups. Stays in bed, and only comes out for meds/meals.

## 2016-11-08 NOTE — Progress Notes (Signed)
Pt awake, alert and oriented today. Minimal interaction with peers noted. Does not attend group. Isolative, sullen, depressed, anxious. Continues to endorse Si, contracts for safety. Also continues to endorse Ah. Does not elaborate on sucidal thoughts or auditory hallucinations. Noted to be singing in the shower this afternoon. Takes antibiotic as prescribed, but refused kenalog cream/ointment this morning as ordered.   Support and encouragement provided with use of therapeutic communication. Medications administered as ordered with education. Safety maintained with every 15 minute checks. Will continue to monitor.

## 2016-11-08 NOTE — [facility] Group Notes (Signed)
[facility] LCSW Group Therapy  11/08/2016 3:18 PM  Type of Therapy:  Group Therapy  Participation Level:  Patient did not attend group. CSW invited patient to group.   Summary of Progress/Problems:Self esteem: Patients discussed self esteem and how it impacts them. They discussed what aspects in their lives has influenced their self esteem. They were challenged to identify changes that are needed in order to improve self esteem. Patients participated in activity where they had to identify positive adjectives they felt described their personality. Patients shared with the group on the following areas: Things I am good at, What I like about my appearance, I've helped others by, What I value the most, compliments I have received, challenges I have overcome, thing that make me unique, and Times I've made others happy.    Xzavion Doswell G. Garnette CzechSampson MSW, LCSWA 11/08/2016, 3:18 PM

## 2016-11-09 MED ORDER — HYDROXYZINE HCL 25 MG PO TABS
25.0000 mg | ORAL_TABLET | Freq: Once | ORAL | Status: AC
  Administered 2016-11-09: 25 mg via ORAL

## 2016-11-09 MED ORDER — MIRTAZAPINE 15 MG PO TABS
7.5000 mg | ORAL_TABLET | Freq: Every day | ORAL | Status: DC
  Administered 2016-11-09 – 2016-11-11 (×3): 7.5 mg via ORAL
  Filled 2016-11-09 (×3): qty 1

## 2016-11-09 NOTE — Progress Notes (Signed)
Pt alert and oriented x 4, respirations even and unlabored, gait steady and unassisted, no acute distress noted. Denies SI/HI/AVH, anxiety and depression but reports, "I feel like I've had the worst day ever." Pt stated "I think it's the medication that I take for sleep. It made me feel drowsy and tired this morning." This writer informed pt that this information would be reported to the on-coming shift to see if there are any other options that could be discussed with the doctor. Pt was also encouraged to speak with doctor to voice her concerns as well. Pt would like to continue taking Remeron but would like to see if she could get a lower dose. Took bedtime medications. No further complaints. Remains on q15 minute observation rounds for safety. Will continue to monitor.

## 2016-11-09 NOTE — Plan of Care (Signed)
Problem: Coping: Goal: Ability to verbalize feelings will improve Outcome: Progressing Pt will verbalize feelings to staff.  Problem: Health Behavior/Discharge Planning: Goal: Compliance with therapeutic regimen will improve Outcome: Progressing Pt will be compliant with therapeutic regimen.

## 2016-11-09 NOTE — Progress Notes (Signed)
Patient presents to nurse reporting feeling upset after talking with Mom. One on one with patient with good effect. Atarax 25 mg po OTO received from MD and administered to patient. Patient verbalizes feelings well, contracts for safety. Patient prayers and states "that helped me". Patient reads magazine and safety maintained.

## 2016-11-09 NOTE — Progress Notes (Signed)
Patient with sad affect, withdrawn behavior. No SI/HI at this time. Minimal interaction with peers. Withdrawn and sleep in bed this am. Safety maintained.

## 2016-11-09 NOTE — [facility] Group Notes (Signed)
[facility] LCSW Group Therapy  11/09/2016 2:59 PM  Type of Therapy:  Group Therapy  Participation Level:  Patient did not attend group. CSW invited patient to group.   Summary of Progress/Problems:Coping Skills: Patients defined and discussed healthy coping skills. Patients identified healthy coping skills they would like to try during hospitalization and after discharge. CSW offered insight to varying coping skills that may have been new to patients such as practicing mindfulness.  Maejor Erven G. Garnette CzechSampson MSW, LCSWA 11/09/2016, 2:59 PM

## 2016-11-09 NOTE — Progress Notes (Signed)
[facility]  11/09/2016 12:25 PM [name] Gradillas  MRN:  [id number]  Subjective:  Ms. Excell SeltzerCooper is a 25 year old female with history of depression, mood, and substance abuse who is well known to us. She returns with suicidal ideation in the context of relapse on substances and treatment noncompliance.  Patient was seen in her room this morning and her chart was reviewed. Patient is sleepy and stated she will talk later Per staff patient has not been very communicative. She has been endorsing that she hears voices that tell her she has no reason to live and that she deserves to die. However patient denying any active suicidal thoughts and able to contract for safety on the unit. It appears that she is sleeping off her on drug withdrawal symptoms. Her urine drug screen was positive for cocaine. Patient not attending groups. Patient feeling too sleepy on remeron.  Principal Problem: Major depressive disorder, recurrent severe without psychotic features (HCC) Diagnosis:   Patient Active Problem List   Diagnosis Date Noted  . Substance induced mood disorder (HCC) [F19.94] 11/07/2016  . Suicidal ideation [R45.851] 11/07/2016  . Borderline personality disorder [F60.3] 11/06/2016  . Cocaine use disorder, severe, dependence (HCC) [F14.20] 11/06/2016  . Cannabis use disorder, moderate, dependence (HCC) [F12.20] 11/06/2016  . Alcohol use disorder, mild, abuse [F10.10] 11/06/2016  . Major depressive disorder, recurrent severe without psychotic features (HCC) [F33.2] 11/06/2016  . 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 Decreased hearing [H91.90] 10/31/2011  . Asthma [J45.909] 09/23/2011  . Tobacco use disorder [F17.200] 09/15/2009   Total Time spent with patient: 20 minutes  Past Psychiatric History: Depression, substance use, suicidal ideation. Family  Past Medical History:  Past Medical History:  Diagnosis Date  . Asthma   . Depression 2009    Inpatient psych admission for SI, dissociative fugue  . Dissociative disorder or reaction 2009  . Eczema   . H/O: suicide attempt   . ODD (oppositional defiant disorder)   . PTSD (post-traumatic stress disorder)   . Schizoaffective disorder (HCC)   . Substance abuse     Past Surgical History:  Procedure Laterality Date  . TONSILLECTOMY     Family History:  Family History  Problem Relation Age of Onset  . Adopted: Yes   Family Psychiatric  History: See H&P. Social History:  History  Alcohol Use  . Yes    Comment: sometimes     History  Drug Use No    Social History   Social History  . Marital status: Single    Spouse name: N/A  . Number of children: N/A  . Years of education: N/A   Social History Main Topics  . Smoking status: Current Every Day Smoker    Packs/day: 0.50    Years: 2.00    Types: Cigarettes  . Smokeless tobacco: Never Used     Comment: "I decided to quit today" (11/04/16)  . Alcohol use Yes     Comment: sometimes  . Drug use: No  . Sexual activity: Yes    Birth control/ protection: Condom   Other Topics Concern  . None   Social History Narrative  . None   Additional Social History:                        Sleep: Fair  Appetite:  Fair  Current Medications: Current Facility-Administered Medications  Medication Dose Route Frequency Provider Last Rate Last Dose  . acetaminophen (TYLENOL) tablet  650 mg  650 mg Oral Q6H PRN Audery Amel, MD      . alum & mag hydroxide-simeth (MAALOX/MYLANTA) 200-200-20 MG/5ML suspension 30 mL  30 mL Oral Q4H PRN Audery Amel, MD      . magnesium hydroxide (MILK OF MAGNESIA) suspension 30 mL  30 mL Oral Daily PRN Audery Amel, MD      . mirtazapine (REMERON) tablet 15 mg  15 mg Oral QHS Jimmy Footman, MD   15 mg at 11/08/16 2137  . sulfamethoxazole-trimethoprim (BACTRIM DS,SEPTRA DS) 800-160 MG per tablet 1 tablet  1 tablet Oral Q8H Shari Prows, MD   1 tablet at 11/09/16  0612  . traZODone (DESYREL) tablet 100 mg  100 mg Oral QHS PRN Shari Prows, MD   100 mg at 11/08/16 2138  . triamcinolone cream (KENALOG) 0.1 %   Topical BID Audery Amel, MD        Lab Results:  No results found for this or any previous visit (from the past 48 hour(s)).  Blood Alcohol level:  Lab Results  Component Value Date   ETH <5 11/04/2016   ETH <5 08/13/2016    Metabolic Disorder Labs: Lab Results  Component Value Date   HGBA1C 5.1 11/06/2016   MPG 100 11/06/2016   Lab Results  Component Value Date   PROLACTIN 21.5 11/06/2016   PROLACTIN 49.4 (H) 08/13/2016   Lab Results  Component Value Date   CHOL 133 11/06/2016   TRIG 80 11/06/2016   HDL 84 11/06/2016   CHOLHDL 1.6 11/06/2016   VLDL 16 11/06/2016   LDLCALC 33 11/06/2016   LDLCALC 32 08/13/2016    Physical Findings: AIMS:  , ,  ,  ,    CIWA:    COWS:     Musculoskeletal: Strength & Muscle Tone: within normal limits Gait & Station: normal Patient leans: N/A  Psychiatric Specialty Exam: Physical Exam  Nursing note and vitals reviewed.   Review of Systems  Skin: Positive for rash.  Psychiatric/Behavioral: Positive for depression, substance abuse and suicidal ideas.  All other systems reviewed and are negative.   Blood pressure (!) 109/58, pulse 78, temperature 98.7 F (37.1 C), temperature source Oral, resp. rate 18, height 5\' 6"  (1.676 m), weight 148 lb (67.1 kg), last menstrual period 10/20/2016, SpO2 100 %.Body mass index is 23.89 kg/m.  General Appearance: Casual  Eye Contact:  Good  Speech:  Clear and Coherent  Volume:  Normal  Mood:  Anxious  Affect:  Appropriate  Thought Process:  Goal Directed and Descriptions of Associations: Intact  Orientation:  Full (Time, Place, and Person)  Thought Content:  WDL  Suicidal Thoughts:  Yes.  with intent/plan  Homicidal Thoughts:  No  Memory:  Immediate;   Fair Recent;   Fair Remote;   Fair  Judgement:  Poor  Insight:  Lacking   Psychomotor Activity:  Normal  Concentration:  Concentration: Fair and Attention Span: Fair  Recall:  [employer] of Knowledge:  Fair  Language:  Fair  Akathisia:  No  Handed:  Right  AIMS (if indicated):     Assets:  Communication Skills Desire for Improvement Physical Health Resilience Social Support  ADL's:  Intact  Cognition:  WNL  Sleep:  Number of Hours: 7     Treatment Plan Summary: Daily contact with patient to assess and evaluate symptoms and progress in treatment and Medication management    Ms. Renegar is a 25 year old female with history of  depression, mood instability, and substance abuse admitted for suicidal ideation in the context of treatment noncompliance and relapse on substances.   Unspecified depressive disorder, rule out cocaine-induced depressive disorder in the setting of withdrawal:  Decrease remeron to 7.5mg  for excessive sedation.  Insomnia patient has orders for trazodone daily at bedtime as needed  Cocaine use disorder, alcohol use disorder, cannabis use disorder rule out opioid use disorder: Patient has been very resistant to receive substance abuse treatment per documentation in prior admissions. This time however the patient says that she feels substance abuse is her main issue and she is interested in receiving substance abuse treatment.  Patient has now reported the use of opiates however during a recent urine toxicology she was positive for opiates. I will try to obtain a urine sample today and completed a urine toxicology.  Tobacco use disorder patient declines from receiving a nicotine patch  Eczema patient has severe eczema that is generalized. I spoken with the pharmacist and requested for the patient to receive Kenalog jar. Will add Bactrim tid.  Asthma continue albuterol inhaler when necessary  Precautions every 15 minute checks  Diet regular  Vital signs daily  Hospitalization status will change to voluntary  treatment  Disposition once a stable she will be discharged back to her mother's house  Follow up to be determined. Seems to be noncompliant [facility] [place] . She is interested in substance abuse.  Patrick NorthAVI, Shatona Andujar, MD 11/09/2016, 12:25 PM

## 2016-11-09 NOTE — Plan of Care (Signed)
Problem: Activity: Goal: Interest or engagement in activities will improve Outcome: Progressing Pt did not engage or participated in group tonight.  Goal: Sleeping patterns will improve Outcome: Progressing Sleep patterns have improved AEB recorded sleep hours.  Problem: Education: Goal: Emotional status will improve Outcome: Progressing Emotional status has improved. Pt is not suicidal at this time.  Problem: Coping: Goal: Ability to verbalize frustrations and anger appropriately will improve Outcome: Progressing Pt is able to verbalize feelings with staff.  Problem: Safety: Goal: Ability to disclose and discuss suicidal ideas will improve Outcome: Progressing Pt notifies staff of any suicidal ideations, anxiety or depression.

## 2016-11-10 LAB — RAPID HIV SCREEN (HIV 1/2 AB+AG)
HIV 1/2 ANTIBODIES: NONREACTIVE
HIV-1 P24 Antigen - HIV24: NONREACTIVE

## 2016-11-10 MED ORDER — CLOTRIMAZOLE 1 % VA CREA
1.0000 | TOPICAL_CREAM | Freq: Every day | VAGINAL | Status: DC
  Administered 2016-11-10: 1 via VAGINAL
  Filled 2016-11-10: qty 45

## 2016-11-10 MED ORDER — CLOTRIMAZOLE 2 % VA CREA: 1.0000 | TOPICAL_CREAM | Freq: Every day | VAGINAL | Status: DC

## 2016-11-10 MED ORDER — ARIPIPRAZOLE 2 MG PO TABS
2.0000 mg | ORAL_TABLET | Freq: Every day | ORAL | Status: DC
  Administered 2016-11-10 – 2016-11-12 (×3): 2 mg via ORAL
  Filled 2016-11-10 (×4): qty 1

## 2016-11-10 NOTE — Progress Notes (Signed)
Chaplains did a joint visit. Patient shared with chaplains about her faith journey and her questions about forgiveness and reconnecting with [employer].

## 2016-11-10 NOTE — Progress Notes (Signed)
[name] had a good shift and was cooperative with treatment. She interacted well with peers and staff, she spent most of the evening in the dayroom with . She was medication complaint. She had no complaints on shift or inappropriate behaviors on shift.

## 2016-11-10 NOTE — [facility] Group Notes (Signed)
[facility] Group Notes:  (Nursing/MHT/Case Management/Adjunct)  Date:  11/10/2016  Time:  12:52 AM  Type of Therapy:  Psychoeducational Skills  Participation Level:  Did Not Attend   Summary of Progress/Problems:  Verena Shawgo Y Simren Popson 11/10/2016, 12:52 AM 

## 2016-11-10 NOTE — Progress Notes (Signed)
Pt is alert and oriented x 4, respirations even and unlabored, gait steady and unassisted, no acute distress noted. Denies having any pain or discomfort. Also denies SI/HI/AVH, as well as depression, however, pt did state that she was upset earlier after talking to her mom on the phone earlier. Reports "I just felt like they didn't care about me. That's why I said earlier that they would be better off without me. I don't feel that way now." Pt agreed to notify staff if she had any suicidal ideations and was able to verbally contract for safety. Pt also reports feeling "a little anxiety." This is in reference to the dosage of Remeron that is administered nightly. Pt states "I felt sleepy all day today too." Pt was informed that her dose of Remeron had been decreased from 15 mg to 7.5 mg. Pt replied "i'll try that. I'll take it." Took bedtime medications without any complications. Is medication compliant but did not attend group to night. No further complaints. Remains on q15 minute observation checks for safety. Will continue to monitor.

## 2016-11-10 NOTE — Progress Notes (Signed)
Patient isolates to room.  Slept late.  Denies SI/HI/AVH.  Minimal interaction with peers and staff. Smiles when passes this Clinical research associatewriter.  States "I had some test drawn that are going to tell you I have communicable diseases that will kill me and I am fine with that, at least I will go peacefully."  Support and encouragement offered.  Safety maintained.

## 2016-11-10 NOTE — Progress Notes (Addendum)
Patient ID: [name] Goold, female   DOB: 03/19/1991, 25 y.o.   MRN: [id number]  Ms. Bridget KidneyHoover approached me today to disclose that she had unprotected sex with someone who has syphilis. She does not know "what syphilis is" except that it is sexually transmitted disease. She would like to be tested and treated if possible.  I see that her recent RPR was negative.

## 2016-11-10 NOTE — [facility] Group Notes (Signed)
[facility] Group Notes:  (Nursing/MHT/Case Management/Adjunct)  Date:  11/10/2016  Time:  6:43 PM  Type of Therapy:  Psychoeducational Skills  Participation Level:  Active  Participation Quality:  Appropriate, Attentive and Supportive  Affect:  Appropriate  Cognitive:  Appropriate  Insight:  Appropriate  Engagement in Group:  Engaged and Supportive  Modes of Intervention:  Discussion, Education and Exploration  Summary of Progress/Problems:  Bridget Carpenter [name] 11/10/2016, 6:43 PM 

## 2016-11-10 NOTE — Progress Notes (Signed)
[facility]  11/10/2016 12:44 PM Bridget Carpenter  MRN:  [id number]  Subjective:  Bridget Carpenter is a 25 year old female with history of depression, mood, and substance abuse who is well known to us. She returns with suicidal ideation in the context of relapse on substances and treatment noncompliance.  No change since admission. Patient spends most of the day in the room. She is not participating in any programming. She was found sleeping in her bed late in the morning. This has been the same way since admission. She says she is very depressed and has thoughts of wanting not to be alive anymore. She says she hears voices throughout the day that tell her she has no value and talk bad about her physical aspect. During assessment the patient has a blunted affect, her tone of voice is very low and I contacted his minimal. There is no other evidence of psychosis such as disorganized behavior, paranoia. Her thought process is linear and goal-directed.  Per nursing: Pt is alert and oriented x 4, respirations even and unlabored, gait steady and unassisted, no acute distress noted. Denies having any pain or discomfort. Also denies SI/HI/AVH, as well as depression, however, pt did state that she was upset earlier after talking to her mom on the phone earlier. Reports "I just felt like they didn't care about me. That's why I said earlier that they would be better off without me. I don't feel that way now." Pt agreed to notify staff if she had any suicidal ideations and was able to verbally contract for safety. Pt also reports feeling "a little anxiety." This is in reference to the dosage of Remeron that is administered nightly. Pt states "I felt sleepy all day today too." Pt was informed that her dose of Remeron had been decreased from 15 mg to 7.5 mg. Pt replied "i'll try that. I'll take it." Took bedtime medications without any complications. Is medication compliant but did not attend group to night. No  further complaints. Remains on q15 minute observation checks for safety. Will continue to monitor.   Principal Problem: Major depressive disorder, recurrent severe without psychotic features (HCC) Diagnosis:   Patient Active Problem List   Diagnosis Date Noted  . Substance induced mood disorder (HCC) [F19.94] 11/07/2016  . Suicidal ideation [R45.851] 11/07/2016  . Borderline personality disorder [F60.3] 11/06/2016  . Cocaine use disorder, severe, dependence (HCC) [F14.20] 11/06/2016  . Cannabis use disorder, moderate, dependence (HCC) [F12.20] 11/06/2016  . Alcohol use disorder, mild, abuse [F10.10] 11/06/2016  . Major depressive disorder, recurrent severe without psychotic features (HCC) [F33.2] 11/06/2016  . 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 Asthma [J45.909] 09/23/2011  . Tobacco use disorder [F17.200] 09/15/2009   Total Time spent with patient: 20 minutes  Past Psychiatric History: Depression, substance use, suicidal ideation. Family  Past Medical History:  Past Medical History:  Diagnosis Date  . Asthma   . Depression 2009   Inpatient psych admission for SI, dissociative fugue  . Dissociative disorder or reaction 2009  . Eczema   . H/O: suicide attempt   . ODD (oppositional defiant disorder)   . PTSD (post-traumatic stress disorder)   . Schizoaffective disorder (HCC)   . Substance abuse     Past Surgical History:  Procedure Laterality Date  . TONSILLECTOMY     Family History:  Family History  Problem Relation Age of Onset  . Adopted: Yes   Family Psychiatric  History: See H&P. Social History:  History  Alcohol Use  . Yes    Comment: sometimes     History  Drug Use No    Social History   Social History  . Marital status: Single    Spouse name: N/A  . Number of children: N/A  . Years of education: N/A   Social History Main Topics  . Smoking status: Current Every Day Smoker    Packs/day: 0.50     Years: 2.00    Types: Cigarettes  . Smokeless tobacco: Never Used     Comment: "I decided to quit today" (11/04/16)  . Alcohol use Yes     Comment: sometimes  . Drug use: No  . Sexual activity: Yes    Birth control/ protection: Condom   Other Topics Concern  . None   Social History Narrative  . None     Current Medications: Current Facility-Administered Medications  Medication Dose Route Frequency Provider Last Rate Last Dose  . acetaminophen (TYLENOL) tablet 650 mg  650 mg Oral Q6H PRN Audery Amel, MD      . alum & mag hydroxide-simeth (MAALOX/MYLANTA) 200-200-20 MG/5ML suspension 30 mL  30 mL Oral Q4H PRN Audery Amel, MD      . ARIPiprazole (ABILIFY) tablet 2 mg  2 mg Oral Daily Bridget Footman, MD      . magnesium hydroxide (MILK OF MAGNESIA) suspension 30 mL  30 mL Oral Daily PRN Audery Amel, MD      . mirtazapine (REMERON) tablet 7.5 mg  7.5 mg Oral QHS Bridget Ravi, MD   7.5 mg at 11/09/16 2117  . sulfamethoxazole-trimethoprim (BACTRIM DS,SEPTRA DS) 800-160 MG per tablet 1 tablet  1 tablet Oral Q8H Shari Prows, MD   1 tablet at 11/10/16 0611  . triamcinolone cream (KENALOG) 0.1 %   Topical BID Audery Amel, MD        Lab Results:  No results found for this or any previous visit (from the past 48 hour(s)).  Blood Alcohol level:  Lab Results  Component Value Date   ETH <5 11/04/2016   ETH <5 08/13/2016    Metabolic Disorder Labs: Lab Results  Component Value Date   HGBA1C 5.1 11/06/2016   MPG 100 11/06/2016   Lab Results  Component Value Date   PROLACTIN 21.5 11/06/2016   PROLACTIN 49.4 (H) 08/13/2016   Lab Results  Component Value Date   CHOL 133 11/06/2016   TRIG 80 11/06/2016   HDL 84 11/06/2016   CHOLHDL 1.6 11/06/2016   VLDL 16 11/06/2016   LDLCALC 33 11/06/2016   LDLCALC 32 08/13/2016    Physical Findings: AIMS:  , ,  ,  ,    CIWA:    COWS:     Musculoskeletal: Strength & Muscle Tone: within normal  limits Gait & Station: normal Patient leans: N/A  Psychiatric Specialty Exam: Physical Exam  Nursing note and vitals reviewed. Constitutional: She is oriented to person, place, and time. She appears well-developed and well-nourished.  HENT:  Head: Normocephalic and atraumatic.  Eyes: EOM are normal.  Neck: Normal range of motion.  Respiratory: Effort normal.  Neurological: She is alert and oriented to person, place, and time.    Review of Systems  Skin: Negative for rash.       Severe eczema   Psychiatric/Behavioral: Positive for depression, hallucinations, substance abuse and suicidal ideas.  All other systems reviewed and are negative.   Blood pressure (!) 107/57, pulse (!) 58, temperature 98.1 F (36.7  C), temperature source Oral, resp. rate 18, height 5\' 6"  (1.676 m), weight 67.1 kg (148 lb), last menstrual period 10/20/2016, SpO2 100 %.Body mass index is 23.89 kg/m.  General Appearance: Casual  Eye Contact:  Good  Speech:  Clear and Coherent  Volume:  Normal  Mood:  Anxious  Affect:  Appropriate  Thought Process:  Goal Directed and Descriptions of Associations: Intact  Orientation:  Full (Time, Place, and Person)  Thought Content:  WDL  Suicidal Thoughts:  Yes.  with intent/plan  Homicidal Thoughts:  No  Memory:  Immediate;   Fair Recent;   Fair Remote;   Fair  Judgement:  Poor  Insight:  Lacking  Psychomotor Activity:  Normal  Concentration:  Concentration: Fair and Attention Span: Fair  Recall:  [employer]Fair  Fund of Knowledge:  Fair  Language:  Fair  Akathisia:  No  Handed:  Right  AIMS (if indicated):     Assets:  Communication Skills Desire for Improvement Physical Health Resilience Social Support  ADL's:  Intact  Cognition:  WNL  Sleep:  Number of Hours: 6.45     Treatment Plan Summary: Daily contact with patient to assess and evaluate symptoms and progress in treatment and Medication management   Patient says there is no improvement since admission.  She's been withdrawn to her room and is not dissipating in any activity or programming. She is claiming she is suicidal and hears voices.  Bridget Carpenter is a 25 year old female with history of depression, mood instability, and substance abuse admitted for suicidal ideation in the context of treatment noncompliance and relapse on substances.   Unspecified depressive disorder, rule out cocaine-induced depressive disorder in the setting of withdrawal: Continue mirtazapine 7.5 mg by mouth daily at bedtime  Psychosis?: Patient does not suffer from a primary psychotic disorder. This could be an MDD with psychotic features or this could be micropsychotic episodes due to borderline personality. Times a day feel patient might be over reporting.  She will be restarted on Abilify 2 mg by mouth daily.  Insomnia patient has orders for trazodone daily at bedtime as needed  Cocaine use disorder, alcohol use disorder, cannabis use disorder rule out opioid use disorder: Patient has been very resistant to receive substance abuse treatment per documentation in prior admissions. This time however the patient says that she feels substance abuse is her main issue and she is interested in receiving substance abuse treatment.  Patient has now reported the use of opiates however during a recent urine toxicology she was positive for opiates. I will try to obtain a urine sample today and completed a urine toxicology.--- Current urine toxicology was only positive for cocaine and negative for opiates or any other substances  Tobacco use disorder patient declines from receiving a nicotine patch  Eczema patient has severe eczema that is generalized. I spoken with the pharmacist and requested for the patient to receive Kenalog jar.   Asthma continue albuterol inhaler when necessary  Precautions every 15 minute checks  Diet regular  Vital signs daily  Hospitalization status will change to voluntary  treatment  Disposition once a stable she will be discharged back to her mother's house  Follow up to be determined. Seems to be noncompliant [facility] [place] [employer]. She is interested in substance abuse.  Bridget FootmanHernandez-Gonzalez,  Bridget Nonaka, MD 11/10/2016, 12:44 PM

## 2016-11-10 NOTE — Progress Notes (Signed)
Recreation Therapy Notes  Date: 11.13.17 Time: 1:00 pm Location: Craft Room  Group Topic: Wellness  Goal Area(s) Addresses:  Patient will identify at least one item per dimension of health. Patient will examine areas they are deficient in.  Behavioral Response: Did not attend  Intervention: 6 Dimensions of Health  Activity: Patients were given a definition sheet with the 6 Dimensions of Health and a worksheet with each dimension listed. Patients were instructed to write at least one item in each dimension that they were doing before they came to the hospital. LRT encouraged patient to write 2-3 items.  Education: LRT educated patients on ways to improve each dimension.  Education Outcome: Patient did not attend group.  Clinical Observations/Feedback: Patient did not attend group.   Jacquelynn CreeGreene,Hadia Minier M, LRT/CTRS 11/10/2016 2:04 PM

## 2016-11-11 MED ORDER — ARIPIPRAZOLE 2 MG PO TABS: 2.0000 mg | ORAL_TABLET | Freq: Every day | ORAL | 0 refills | Status: DC

## 2016-11-11 MED ORDER — MIRTAZAPINE 7.5 MG PO TABS: 7.5000 mg | ORAL_TABLET | Freq: Every day | ORAL | 0 refills | Status: DC

## 2016-11-11 MED ORDER — CLOTRIMAZOLE 1 % VA CREA: 1.0000 | TOPICAL_CREAM | Freq: Every day | VAGINAL | 0 refills | Status: AC

## 2016-11-11 NOTE — Progress Notes (Signed)
[facility]  11/11/2016 11:00 AM Bridget Carpenter  MRN:  [id number]  Subjective:  Bridget Carpenter is a 25 year old female with history of depression, mood, and substance abuse who is well known to us. She returns with suicidal ideation in the context of relapse on substances and treatment noncompliance.  No change since admission. Patient spends most of the day in the room. She is not participating in any programming. She was found sleeping in her bed late in the morning again today. This has been the same way since admission. I do not feel there has been any benefit for her during this hospitalization as she is feeling not engaging in any treatment. Her reports of psychosis are only subjective and there is no really any evidence of disorganized behavior, bizarre behavior thought process paranoia suspiciousness.   I have from spending to the patient on planning to go ahead with discharge tomorrow. We will try to set her up with [facility] as they have a community support team. Patient says she has significant problems with transportation and I feel this will be beneficial for her    Per nursing: Patient with appropriate affect, cooperative behavior with meals, meds and plan of care. No SI/HI at this time. Tired in am, returns to bed after am meal. Therapy groups encouraged. Refuses am dose eczema cream. Safety maintained. Affect slightly brighter.   Principal Problem: Major depressive disorder, recurrent severe without psychotic features (HCC) Diagnosis:   Patient Active Problem List   Diagnosis Date Noted  . Substance induced mood disorder (HCC) [F19.94] 11/07/2016  . Suicidal ideation [R45.851] 11/07/2016  . Borderline personality disorder [F60.3] 11/06/2016  . Cocaine use disorder, severe, dependence (HCC) [F14.20] 11/06/2016  . Cannabis use disorder, moderate, dependence (HCC) [F12.20] 11/06/2016  . Alcohol use disorder, mild, abuse [F10.10] 11/06/2016  . Major depressive disorder,  recurrent severe without psychotic features (HCC) [F33.2] 11/06/2016  . 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 Asthma [J45.909] 09/23/2011  . Tobacco use disorder [F17.200] 09/15/2009   Total Time spent with patient: 20 minutes  Past Psychiatric History: Depression, substance use, suicidal ideation. Family  Past Medical History:  Past Medical History:  Diagnosis Date  . Asthma   . Depression 2009   Inpatient psych admission for SI, dissociative fugue  . Dissociative disorder or reaction 2009  . Eczema   . H/O: suicide attempt   . ODD (oppositional defiant disorder)   . PTSD (post-traumatic stress disorder)   . Schizoaffective disorder (HCC)   . Substance abuse     Past Surgical History:  Procedure Laterality Date  . TONSILLECTOMY     Family History:  Family History  Problem Relation Age of Onset  . Adopted: Yes   Family Psychiatric  History: See H&P. Social History:  History  Alcohol Use  . Yes    Comment: sometimes     History  Drug Use No    Social History   Social History  . Marital status: Single    Spouse name: N/A  . Number of children: N/A  . Years of education: N/A   Social History Main Topics  . Smoking status: Current Every Day Smoker    Packs/day: 0.50    Years: 2.00    Types: Cigarettes  . Smokeless tobacco: Never Used     Comment: "I decided to quit today" (11/04/16)  . Alcohol use Yes     Comment: sometimes  . Drug use: No  . Sexual activity: Yes  Birth control/ protection: Condom   Other Topics Concern  . None   Social History Narrative  . None     Current Medications: Current Facility-Administered Medications  Medication Dose Route Frequency Provider Last Rate Last Dose  . acetaminophen (TYLENOL) tablet 650 mg  650 mg Oral Q6H PRN Audery Amel, MD      . alum & mag hydroxide-simeth (MAALOX/MYLANTA) 200-200-20 MG/5ML suspension 30 mL  30 mL Oral Q4H PRN Audery Amel, MD      .  ARIPiprazole (ABILIFY) tablet 2 mg  2 mg Oral Daily Jimmy Footman, MD   2 mg at 11/11/16 0830  . clotrimazole (GYNE-LOTRIMIN) vaginal cream 1 Applicatorful  1 Applicatorful Vaginal QHS Jimmy Footman, MD   1 Applicatorful at 11/10/16 2146  . magnesium hydroxide (MILK OF MAGNESIA) suspension 30 mL  30 mL Oral Daily PRN Audery Amel, MD      . mirtazapine (REMERON) tablet 7.5 mg  7.5 mg Oral QHS Himabindu Ravi, MD   7.5 mg at 11/10/16 2145  . triamcinolone cream (KENALOG) 0.1 %   Topical BID Audery Amel, MD        Lab Results:  Results for orders placed or performed during the hospital encounter of 11/05/16 (from the past 48 hour(s))  Rapid HIV screen (HIV 1/2 Ab+Ag)     Status: None   Collection Time: 11/10/16  3:48 PM  Result Value Ref Range   HIV-1 P24 Antigen - HIV24 NON REACTIVE NON REACTIVE   HIV 1/2 Antibodies NON REACTIVE NON REACTIVE   Interpretation (HIV Ag Ab)      A non reactive test result means that HIV 1 or HIV 2 antibodies and HIV 1 p24 antigen were not detected in the specimen.    Blood Alcohol level:  Lab Results  Component Value Date   ETH <5 11/04/2016   ETH <5 08/13/2016    Metabolic Disorder Labs: Lab Results  Component Value Date   HGBA1C 5.1 11/06/2016   MPG 100 11/06/2016   Lab Results  Component Value Date   PROLACTIN 21.5 11/06/2016   PROLACTIN 49.4 (H) 08/13/2016   Lab Results  Component Value Date   CHOL 133 11/06/2016   TRIG 80 11/06/2016   HDL 84 11/06/2016   CHOLHDL 1.6 11/06/2016   VLDL 16 11/06/2016   LDLCALC 33 11/06/2016   LDLCALC 32 08/13/2016    Physical Findings: AIMS:  , ,  ,  ,    CIWA:    COWS:     Musculoskeletal: Strength & Muscle Tone: within normal limits Gait & Station: normal Patient leans: N/A  Psychiatric Specialty Exam: Physical Exam  Nursing note and vitals reviewed. Constitutional: She is oriented to person, place, and time. She appears well-developed and well-nourished.  HENT:   Head: Normocephalic and atraumatic.  Eyes: EOM are normal.  Neck: Normal range of motion.  Respiratory: Effort normal.  Neurological: She is alert and oriented to person, place, and time.    Review of Systems  Skin: Negative for rash.       Severe eczema   Psychiatric/Behavioral: Positive for depression, hallucinations, substance abuse and suicidal ideas.  All other systems reviewed and are negative.   Blood pressure 113/69, pulse 68, temperature 98.1 F (36.7 C), temperature source Oral, resp. rate 18, height 5\' 6"  (1.676 m), weight 67.1 kg (148 lb), last menstrual period 10/20/2016, SpO2 100 %.Body mass index is 23.89 kg/m.  General Appearance: Casual  Eye Contact:  Good  Speech:  Clear and Coherent  Volume:  Normal  Mood:  Anxious  Affect:  Appropriate  Thought Process:  Goal Directed and Descriptions of Associations: Intact  Orientation:  Full (Time, Place, and Person)  Thought Content:  WDL  Suicidal Thoughts:  Yes.  with intent/plan  Homicidal Thoughts:  No  Memory:  Immediate;   Fair Recent;   Fair Remote;   Fair  Judgement:  Poor  Insight:  Lacking  Psychomotor Activity:  Normal  Concentration:  Concentration: Fair and Attention Span: Fair  Recall:  [employer]Fair  Fund of Knowledge:  Fair  Language:  Fair  Akathisia:  No  Handed:  Right  AIMS (if indicated):     Assets:  Communication Skills Desire for Improvement Physical Health Resilience Social Support  ADL's:  Intact  Cognition:  WNL  Sleep:  Number of Hours: 5.5     Treatment Plan Summary: Daily contact with patient to assess and evaluate symptoms and progress in treatment and Medication management   Patient says there is no improvement since admission. She's been withdrawn to her room and is not dissipating in any activity or programming. She is claiming she is suicidal and hears voices.  Bridget Carpenter is a 25 year old female with history of depression, mood instability, and substance abuse admitted for  suicidal ideation in the context of treatment noncompliance and relapse on substances.   Unspecified depressive disorder, rule out cocaine-induced depressive disorder in the setting of withdrawal: Continue mirtazapine 7.5 mg by mouth daily at bedtime  Psychosis?: Patient does not suffer from a primary psychotic disorder. This could be an MDD with psychotic features or this could be micropsychotic episodes due to borderline personality. Times a day feel patient might be over reporting.  She has been started on Abilify 2 mg by mouth daily.  Insomnia patient has orders for trazodone daily at bedtime as needed  Cocaine use disorder, alcohol use disorder, cannabis use disorder rule out opioid use disorder: Patient has been very resistant to receive substance abuse treatment per documentation in prior admissions. This time however the patient says that she feels substance abuse is her main issue and she is interested in receiving substance abuse treatment.  Patient has now reported the use of opiates however during a recent urine toxicology she was positive for opiates. I will try to obtain a urine sample today and completed a urine toxicology.--- Current urine toxicology was only positive for cocaine and negative for opiates or any other substances  Tobacco use disorder patient declines from receiving a nicotine patch  Eczema patient has severe eczema that is generalized. I spoken with the pharmacist and requested for the patient to receive Kenalog jar.   Asthma continue albuterol inhaler when necessary  Precautions every 15 minute checks  Diet regular  Vital signs daily  Hospitalization status will change to voluntary treatment  Disposition once a stable she will be discharged back to her mother's house  Follow up: We'll refer to [facility] as they offer community support team. She has a history of noncompliance and lack of transportation I feel that having community support team will  encourage her to be more  Compliant  Plan to discharge tomorrow  No changes in medications today [name],  Doranne Schmutz, MD 11/11/2016, 11:00 AM

## 2016-11-11 NOTE — [facility] Group Notes (Signed)
[facility] Group Notes:  (Nursing/MHT/Case Management/Adjunct)  Date:  11/11/2016  Time:  10:12 PM  Type of Therapy:  Evening Wrap-up Group  Participation Level:  Did Not Attend  Participation Quality:  N/A  Affect:  N/A  Cognitive:  N/A  Insight:  None  Engagement in Group:  Did Not Attend  Modes of Intervention:  Discussion  Summary of Progress/Problems:  Tomasita MorrowChelsea Nanta Bensen [place] 11/11/2016, 10:12 PM

## 2016-11-11 NOTE — Progress Notes (Signed)
CH visited with Pt. PT expressed suicidal thought upon discharge. CH and Pt talked about consequences of that action. CH encouraged PT to report to nurse. CH closed time with prayer.

## 2016-11-11 NOTE — Progress Notes (Signed)
Recreation Therapy Notes  Date: 11.14.17 Time: 3:00 pm Location: Craft Room  Group Topic: Self-expression  Goal Area(s) Addresses: Patient will write emotions they are feeling. Patient will verbalize benefit of using art as a means of self-expression  Behavioral Response: Did not attend  Intervention: Bottled Up  Activity: Patients were instructed to draw a bottle the way they see themselves and write the emotions they are feeling inside the bottle.  Education: LRT educated patients on other forms of self-expression.  Education Outcome: Patient did not attend group.   Clinical Observations/Feedback: Patient did not attend group.  Diogo Anne M, LRT/CTRS 11/11/2016 3:46 PM 

## 2016-11-11 NOTE — [facility] Group Notes (Signed)
[facility] Group Notes:  (Nursing/MHT/Case Management/Adjunct)  Date:  11/11/2016  Time:  5:30 AM  Type of Therapy:  Psychoeducational Skills  Participation Level:  Active  Participation Quality:  Appropriate  Affect:  Appropriate  Cognitive:  Alert  Insight:  Good  Engagement in Group:  Supportive  Modes of Intervention:  Activity  Summary of Progress/Problems:  Mayra NeerJackie L Wanona Stare 11/11/2016, 5:30 AM

## 2016-11-11 NOTE — Progress Notes (Signed)
Patient has been sleeping comfortably. There is no sign of discomfort. Staff continue to monitor per unit protocol.

## 2016-11-11 NOTE — Progress Notes (Signed)
Anxious, applying cream to face, unable to keep still, "I am not really feel suicidal now, I am hopeful ..." Patient is inconsistent with thoughts, wearing long sleeve inner shirt, uncomfortable to reveal her hands. Medication compliant, has multiple phone calls during the phone time.

## 2016-11-11 NOTE — Progress Notes (Signed)
Patient with appropriate affect, cooperative behavior with meals, meds and plan of care. No SI/HI at this time. Tired in am, returns to bed after am meal. Therapy groups encouraged. Refuses am dose eczema cream. Safety maintained. Affect slightly brighter.

## 2016-11-11 NOTE — [facility] Group Notes (Signed)
[facility] Group Notes:  (Nursing/MHT/Case Management/Adjunct)  Date:  11/11/2016  Time:  2:30 PM  Type of Therapy:  Psychoeducational Skills  Participation Level:  Did Not Attend    Bridget Carpenter 11/11/2016, 2:30 PM

## 2016-11-11 NOTE — Progress Notes (Signed)
[facility] the [facility]. [facility] and PT discussed meeting later in the evening for further conversation. PT was receptive to further conversation and was excited and engaged.

## 2016-11-12 NOTE — Progress Notes (Signed)
  [facility] :  Will you be returning to the same living situation after discharge:  Yes,  home with mother. At discharge, do you have transportation home?: Yes,  family friend will pick pt up. Do you have the ability to pay for your medications: Yes,  Medicaid coverage.  Release of information consent forms completed and in the chart;  Patient's signature needed at discharge.  Patient to Follow up at: Follow-up Information    [facility] [place]. Go on 11/18/2016.   Why:  Please arrive to your medication management and therapist appointment on Nov. 21st. Your appt with Dr. Larence PenningBullard for med management is at 9:30AM and therapist appt. is [facility] 2 o'clock PM [facility].  Contact information: [address], [address] [place], [place] [address] Phone: [phone] Fax: [phone]           Next level of care provider has access [facility]:no  Safety Planning and Suicide Prevention discussed: Yes,  completed with patient.  Have you used any form of tobacco in the last 30 days? (Cigarettes, Smokeless Tobacco, Cigars, and/or Pipes): Yes  Has patient been referred to the Quitline?: Patient refused referral  Patient has been referred for addiction treatment: Yes  Lynden OxfordKadijah R Myer Bohlman, MSW, LCSW-A 11/12/2016, 11:39 AM

## 2016-11-12 NOTE — Tx Team (Signed)
Interdisciplinary Treatment and Diagnostic Plan Update  11/12/2016 Time of Session: 10:30 AM Bridget [place] C [place] MRN: [id number]  Principal Diagnosis: Major depressive disorder, recurrent severe without psychotic features (HCC)  Secondary Diagnoses: Principal Problem:   Major depressive disorder, recurrent severe without psychotic features (HCC) Active Problems:   Tobacco use disorder   Asthma   Borderline personality disorder   Cocaine use disorder, severe, dependence (HCC)   Cannabis use disorder, moderate, dependence (HCC)   Alcohol use disorder, mild, abuse   Current Medications:  Current Facility-Administered Medications  Medication Dose Route Frequency Provider Last Rate Last Dose  . acetaminophen (TYLENOL) tablet 650 mg  650 mg Oral Q6H PRN Audery AmelJohn T Clapacs, MD      . alum & mag hydroxide-simeth (MAALOX/MYLANTA) 200-200-20 MG/5ML suspension 30 mL  30 mL Oral Q4H PRN Audery AmelJohn T Clapacs, MD      . ARIPiprazole (ABILIFY) tablet 2 mg  2 mg Oral Daily Jimmy FootmanAndrea Hernandez-Gonzalez, MD   2 mg at 11/12/16 1008  . clotrimazole (GYNE-LOTRIMIN) vaginal cream 1 Applicatorful  1 Applicatorful Vaginal QHS Jimmy FootmanAndrea Hernandez-Gonzalez, MD   1 Applicatorful at 11/10/16 2146  . magnesium hydroxide (MILK OF MAGNESIA) suspension 30 mL  30 mL Oral Daily PRN Audery AmelJohn T Clapacs, MD      . mirtazapine (REMERON) tablet 7.5 mg  7.5 mg Oral QHS Himabindu Ravi, MD   7.5 mg at 11/11/16 2120  . triamcinolone cream (KENALOG) 0.1 %   Topical BID Audery AmelJohn T Clapacs, MD       PTA Medications: Prescriptions Prior to Admission  Medication Sig Dispense Refill Last Dose  . albuterol (PROVENTIL HFA;VENTOLIN HFA) 108 (90 BASE) MCG/ACT inhaler Inhale 2 puffs into the lungs every 6 (six) hours as needed for wheezing or shortness of breath. 1 Inhaler 2 prn at prn  . clindamycin (CLEOCIN) 300 MG capsule Take 1 capsule (300 mg total) by mouth every 8 (eight) hours. (Patient not taking: Reported on 11/04/2016) 42 capsule 0 Not Taking at  Unknown time  . clobetasol ointment (TEMOVATE) 0.05 % Apply topically 2 (two) times daily. (Patient not taking: Reported on 11/04/2016) 30 g 0 Not Taking at Unknown time  . hydrOXYzine (ATARAX/VISTARIL) 25 MG tablet Take 1 tablet (25 mg total) by mouth every 4 (four) hours as needed for anxiety. (Patient not taking: Reported on 11/04/2016) 90 tablet 1 Not Taking at Unknown time  . risperiDONE (RISPERDAL) 2 MG tablet Take 1 tablet (2 mg total) by mouth 2 (two) times daily. (Patient not taking: Reported on 11/04/2016) 60 tablet 1 Not Taking at Unknown time  . SEROQUEL XR 200 MG 24 hr tablet Take 200 mg by mouth at bedtime.   11/03/2016 at Unknown time  . sulfamethoxazole-trimethoprim (BACTRIM DS,SEPTRA DS) 800-160 MG tablet Take 2 tablets by mouth 2 (two) times daily. (Patient not taking: Reported on 11/04/2016) 40 tablet 0 Completed Course at Unknown time    Patient Stressors: Health problems Medication change or noncompliance Substance abuse  Patient Strengths: Ability for insight Average or above average intelligence Communication skills Supportive family/friends  Treatment Modalities: Medication Management, Group therapy, Case management,  1 to 1 session with clinician, Psychoeducation, Recreational therapy.   Physician Treatment Plan for Primary Diagnosis: Major depressive disorder, recurrent severe without psychotic features (HCC) Long Term Goal(s): Improvement in symptoms so as ready for discharge Improvement in symptoms so as ready for discharge   Short Term Goals: Ability to identify changes in lifestyle to reduce recurrence of condition will improve Ability to verbalize feelings will  improve Ability to disclose and discuss suicidal ideas Ability to demonstrate self-control will improve Ability to identify and develop effective coping behaviors will improve Compliance with prescribed medications will improve Ability to identify triggers associated with substance abuse/mental health  issues will improve Ability to identify changes in lifestyle to reduce recurrence of condition will improve Ability to identify and develop effective coping behaviors will improve Ability to identify triggers associated with substance abuse/mental health issues will improve  Medication Management: Evaluate patient's response, side effects, and tolerance of medication regimen.  Therapeutic Interventions: 1 to 1 sessions, Unit Group sessions and Medication administration.  Evaluation of Outcomes: Adequate for discharge   Physician Treatment Plan for Secondary Diagnosis: Principal Problem:   Major depressive disorder, recurrent severe without psychotic features (HCC) Active Problems:   Tobacco use disorder   Asthma   Borderline personality disorder   Cocaine use disorder, severe, dependence (HCC)   Cannabis use disorder, moderate, dependence (HCC)   Alcohol use disorder, mild, abuse  Long Term Goal(s): Improvement in symptoms so as ready for discharge Improvement in symptoms so as ready for discharge   Short Term Goals: Ability to identify changes in lifestyle to reduce recurrence of condition will improve Ability to verbalize feelings will improve Ability to disclose and discuss suicidal ideas Ability to demonstrate self-control will improve Ability to identify and develop effective coping behaviors will improve Compliance with prescribed medications will improve Ability to identify triggers associated with substance abuse/mental health issues will improve Ability to identify changes in lifestyle to reduce recurrence of condition will improve Ability to identify and develop effective coping behaviors will improve Ability to identify triggers associated with substance abuse/mental health issues will improve     Medication Management: Evaluate patient's response, side effects, and tolerance of medication regimen.  Therapeutic Interventions: 1 to 1 sessions, Unit Group sessions and  Medication administration.  Evaluation of Outcomes: Adequate for discharge    RN Treatment Plan for Primary Diagnosis: Major depressive disorder, recurrent severe without psychotic features (HCC) Long Term Goal(s): Knowledge of disease and therapeutic regimen to maintain health will improve  Short Term Goals: Ability to remain free from injury will improve, Ability to verbalize frustration and anger appropriately will improve, Ability to demonstrate self-control, Ability to participate in decision making will improve and Compliance with prescribed medications will improve  Medication Management: RN will administer medications as ordered by provider, will assess and evaluate patient's response and provide education to patient for prescribed medication. RN will report any adverse and/or side effects to prescribing provider.  Therapeutic Interventions: 1 on 1 counseling sessions, Psychoeducation, Medication administration, Evaluate responses to treatment, Monitor vital signs and CBGs as ordered, Perform/monitor CIWA, COWS, AIMS and Fall Risk screenings as ordered, Perform wound care treatments as ordered.  Evaluation of Outcomes: Adequate for discharge    LCSW Treatment Plan for Primary Diagnosis: Major depressive disorder, recurrent severe without psychotic features (HCC) Long Term Goal(s): Safe transition to appropriate next level of care at discharge, Engage patient in therapeutic group addressing interpersonal concerns.  Short Term Goals: Engage patient in aftercare planning with referrals and resources, Increase social support, Increase emotional regulation and Facilitate acceptance of mental health diagnosis and concerns  Therapeutic Interventions: Assess for all discharge needs, 1 to 1 time with Social worker, Explore available resources and support systems, Assess for adequacy in community support network, Educate family and significant other(s) on suicide prevention, Complete Psychosocial  Assessment, Interpersonal group therapy.  Evaluation of Outcomes: Adequate for discharge  Progress in Treatment: Attending groups: Yes. Participating in groups: Yes. Taking medication as prescribed: Yes. Toleration medication: Yes. Family/Significant other contact made: No, will contact:  Pt refused family contact. Patient understands diagnosis: Yes. Discussing patient identified problems/goals with staff: Yes. Medical problems stabilized or resolved: Yes. Denies suicidal/homicidal ideation: Yes. Issues/concerns per patient self-inventory: No.   New problem(s) identified: No, Describe:  None.  New Short Term/Long Term Goal(s):  Discharge Plan or Barriers:   Reason for Continuation of Hospitalization: Anxiety Depression Suicidal ideation  Estimated Length of Stay: 3 days   Attendees: Patient: Bridget Carpenter 11/12/2016 10:42 AM  Physician: Dr. Radene Journey, MD 11/12/2016 10:42 AM  Nursing: Leonia Reader, RN 11/12/2016 10:42 AM  RN Care Manager: 11/12/2016 10:42 AM  Social Worker: Hampton Abbot, MSW, LCSW-A 11/12/2016 10:42 AM  Recreational Therapist: Hershal Coria, LRT, CTRS  11/12/2016 10:42 AM   Scribe for Treatment Team: Lynden Oxford, LCSWA 11/12/2016 10:42 AM

## 2016-11-12 NOTE — Progress Notes (Signed)
Patient is pleasant and cooperative.  Smiles easily.  Denies SI/HI/AVH.   Discharge instructions given, verbalized understanding.  Prescriptions given and stored medications returned.  Personal belongings returned.  Escorted off unit by staff member to main entrance to travel home.
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
## 2016-11-12 NOTE — Plan of Care (Signed)
Problem: Coping: Goal: Ability to verbalize frustrations and anger appropriately will improve Outcome: Progressing No anger or temper tantrums during this shift.

## 2016-11-12 NOTE — [facility] Group Notes (Signed)
[facility] LCSW Group Therapy  11/12/2016 11:14 AM  Type of Therapy:  Group Therapy  Participation Level:  Patient did not attend group. CSW invited patient to group.   Summary of Progress/Problems:Emotional Regulation: Patients will identify both negative and positive emotions. They will discuss emotions they have difficulty regulating and how they impact their lives. Patients will be asked to identify healthy coping skills to combat unhealthy reactions to negative emotions.    Truitt Cruey G. Garnette CzechSampson MSW, LCSWA 11/12/2016, 11:14 AM

## 2016-11-12 NOTE — [facility] Suicide Risk Assessment (Signed)
[facility]   Principal Problem: Major depressive disorder, recurrent severe without psychotic features [facility](HCC) Discharge Diagnoses:  Patient Active Problem List   Diagnosis Date Noted  . Substance induced mood disorder (HCC) [F19.94] 11/07/2016  . Suicidal ideation [R45.851] 11/07/2016  . Borderline personality disorder [F60.3] 11/06/2016  . Cocaine use disorder, severe, dependence (HCC) [F14.20] 11/06/2016  . Cannabis use disorder, moderate, dependence (HCC) [F12.20] 11/06/2016  . Alcohol use disorder, mild, abuse [F10.10] 11/06/2016  . Major depressive disorder, recurrent severe without psychotic features (HCC) [F33.2] 11/06/2016  . 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 Asthma [J45.909] 09/23/2011  . Tobacco use disorder [F17.200] 09/15/2009     Psychiatric Specialty Exam: ROS  Blood pressure 108/64, pulse 72, temperature 98.3 F (36.8 C), temperature source Oral, resp. rate 18, height 5\' 6"  (1.676 m), weight 67.1 kg (148 lb), last menstrual period 10/20/2016, SpO2 100 %.Body mass index is 23.89 kg/m.                                                       Mental Status Per Nursing Assessment::   On Admission:     Demographic Factors:  Adolescent or young adult  Loss Factors: Legal issues  Historical Factors: Impulsivity  Risk Reduction Factors:   Responsible for children under 25 years of age, Sense of responsibility to family and Living with another person, especially a relative  Continued Clinical Symptoms:  Alcohol/Substance Abuse/Dependencies Personality Disorders:   Cluster B Previous Psychiatric Diagnoses and Treatments  Cognitive Features That Contribute To Risk:  Polarized thinking    Suicide Risk:  Minimal: No identifiable suicidal ideation.  Patients presenting with no risk factors but with morbid ruminations; may be classified as minimal risk based on the  severity of the depressive symptoms   [name],  Bridget Ferrin, MD 11/12/2016, 9:18 AM

## 2016-12-15 ENCOUNTER — Emergency Department
Admission: EM | Admit: 2016-12-15 | Discharge: 2016-12-16 | Disposition: A | Discharge status: Home / Self Care | Payer: Medicaid Other | Attending: Emergency Medicine | Admitting: Emergency Medicine

## 2016-12-15 ENCOUNTER — Encounter: Payer: Self-pay | Admitting: Emergency Medicine

## 2016-12-15 DIAGNOSIS — N390 Urinary tract infection, site not specified: Secondary | ICD-10-CM | POA: Diagnosis not present

## 2016-12-15 DIAGNOSIS — F1410 Cocaine abuse, uncomplicated: Secondary | ICD-10-CM | POA: Diagnosis not present

## 2016-12-15 DIAGNOSIS — J45909 Unspecified asthma, uncomplicated: Secondary | ICD-10-CM | POA: Diagnosis not present

## 2016-12-15 DIAGNOSIS — F913 Oppositional defiant disorder: Secondary | ICD-10-CM | POA: Insufficient documentation

## 2016-12-15 DIAGNOSIS — F1220 Cannabis dependence, uncomplicated: Secondary | ICD-10-CM | POA: Diagnosis present

## 2016-12-15 DIAGNOSIS — F1210 Cannabis abuse, uncomplicated: Secondary | ICD-10-CM | POA: Diagnosis not present

## 2016-12-15 DIAGNOSIS — F17210 Nicotine dependence, cigarettes, uncomplicated: Secondary | ICD-10-CM | POA: Diagnosis not present

## 2016-12-15 DIAGNOSIS — F250 Schizoaffective disorder, bipolar type: Secondary | ICD-10-CM | POA: Diagnosis not present

## 2016-12-15 DIAGNOSIS — F1010 Alcohol abuse, uncomplicated: Secondary | ICD-10-CM | POA: Diagnosis present

## 2016-12-15 DIAGNOSIS — Z79899 Other long term (current) drug therapy: Secondary | ICD-10-CM | POA: Diagnosis not present

## 2016-12-15 DIAGNOSIS — R443 Hallucinations, unspecified: Secondary | ICD-10-CM | POA: Diagnosis present

## 2016-12-15 DIAGNOSIS — F4310 Post-traumatic stress disorder, unspecified: Secondary | ICD-10-CM | POA: Diagnosis not present

## 2016-12-15 DIAGNOSIS — F14150 Cocaine abuse with cocaine-induced psychotic disorder with delusions: Secondary | ICD-10-CM | POA: Diagnosis not present

## 2016-12-15 LAB — COMPREHENSIVE METABOLIC PANEL
ALBUMIN: 4.4 g/dL (ref 3.5–5.0)
ALK PHOS: 56 U/L (ref 38–126)
ALT: 12 U/L — ABNORMAL LOW (ref 14–54)
ANION GAP: 10 (ref 5–15)
AST: 27 U/L (ref 15–41)
BUN: 15 mg/dL (ref 6–20)
CALCIUM: 9.5 mg/dL (ref 8.9–10.3)
CO2: 22 mmol/L (ref 22–32)
Chloride: 102 mmol/L (ref 101–111)
Creatinine, Ser: 1 mg/dL (ref 0.44–1.00)
GFR calc Af Amer: >60 mL/min (ref >60)
GFR calc non Af Amer: >60 mL/min (ref >60)
GLUCOSE: 90 mg/dL (ref 65–99)
POTASSIUM: 3.4 mmol/L — AB (ref 3.5–5.1)
SODIUM: 134 mmol/L — AB (ref 135–145)
Total Bilirubin: 1.6 mg/dL — ABNORMAL HIGH (ref 0.3–1.2)
Total Protein: 8.7 g/dL — ABNORMAL HIGH (ref 6.5–8.1)

## 2016-12-15 LAB — CBC
HEMATOCRIT: 42.5 % (ref 35.0–47.0)
HEMOGLOBIN: 14.6 g/dL (ref 12.0–16.0)
MCH: 31.8 pg (ref 26.0–34.0)
MCHC: 34.2 g/dL (ref 32.0–36.0)
MCV: 93 fL (ref 80.0–100.0)
Platelets: 223 10*3/uL (ref 150–440)
RBC: 4.57 MIL/uL (ref 3.80–5.20)
RDW: 13.1 % (ref 11.5–14.5)
WBC: 8.4 10*3/uL (ref 3.6–11.0)

## 2016-12-15 LAB — SALICYLATE LEVEL: Salicylate Lvl: <7 mg/dL (ref 2.8–30.0)

## 2016-12-15 LAB — ETHANOL: Alcohol, Ethyl (B): <5 mg/dL (ref <5)

## 2016-12-15 LAB — ACETAMINOPHEN LEVEL

## 2016-12-15 NOTE — ED Notes (Signed)
Pt requesting this RN to call mom.  Spoke with Fredia SorrowMom [name], at [phone].  Per mom, pt has not slept in 3-4 days and has had a fixation with checking the locks on outside doors and windows in the house.  Pt has been calling family repeatedly to check on their safety and has been preoccupied with safety.  Per mom, she has patient's daughter with her.  Pt is very concerned about her daughter's safety.  Allowed pt to speak to her mom in an effort to calm her down.  Mom states there is possible drug abuse involved, but she is not sure regarding this.  Pt lives primarily with her mother.  Mom states that pt is having hallucinations at this time.

## 2016-12-15 NOTE — ED Notes (Signed)
Issued [place] tray and ginger ale. AS

## 2016-12-15 NOTE — ED Provider Notes (Signed)
[facility] Emergency Department Provider Note   ____________________________________________   First MD Initiated Contact with Patient 12/15/16 2337     (approximate)  I have reviewed the triage vital signs and the nursing notes.   HISTORY  Chief Complaint Mental Health Problem    HPI Bridget Carpenter Bridget Carpenter is a 25 y.o. female sent to the ED from home under IVC for paranoia, hallucinations. Patient has a history of schizoaffective disorder, bipolar type whose mother states on IVC papers that she is on a new medication and began having visual hallucinations in the last few days. She is exhibiting paranoia and thinking someone is trying to kill her. Also suffering from insomnia. Voices no medical complaints.   Past Medical History:  Diagnosis Date  . Asthma   . Depression 2009   Inpatient psych admission for SI, dissociative fugue  . Dissociative disorder or reaction 2009  . Eczema   . H/O: suicide attempt   . ODD (oppositional defiant disorder)   . PTSD (post-traumatic stress disorder)   . Schizoaffective disorder (HCC)   . Substance abuse     Patient Active Problem List   Diagnosis Date Noted  . Borderline personality disorder 11/06/2016  . Cocaine use disorder, severe, dependence (HCC) 11/06/2016  . Cannabis use disorder, moderate, dependence (HCC) 11/06/2016  . Alcohol use disorder, mild, abuse 11/06/2016  . Major depressive disorder, recurrent severe without psychotic features (HCC) 11/06/2016  . Asthma 09/23/2011  . Tobacco use disorder 09/15/2009    Past Surgical History:  Procedure Laterality Date  . TONSILLECTOMY      Prior to Admission medications   Medication Sig Start Date End Date Taking? Authorizing Provider  albuterol (PROVENTIL HFA;VENTOLIN HFA) 108 (90 BASE) MCG/ACT inhaler Inhale 2 puffs into the lungs every 6 (six) hours as needed for wheezing or shortness of breath. 07/21/15   Audery AmelJohn T Clapacs, MD  ARIPiprazole (ABILIFY) 2 MG  tablet Take 1 tablet (2 mg total) by mouth daily. 11/12/16   Jimmy FootmanAndrea Hernandez-Gonzalez, MD  clobetasol ointment (TEMOVATE) 0.05 % Apply topically 2 (two) times daily. Patient not taking: Reported on 11/04/2016 08/18/16   Shari ProwsJolanta B Pucilowska, MD  mirtazapine (REMERON) 7.5 MG tablet Take 1 tablet (7.5 mg total) by mouth at bedtime. 11/11/16   Jimmy FootmanAndrea Hernandez-Gonzalez, MD    Allergies Banana  Family History  Problem Relation Age of Onset  . Adopted: Yes    Social History Social History  Substance Use Topics  . Smoking status: Current Every Day Smoker    Packs/day: 0.50    Years: 2.00    Types: Cigarettes  . Smokeless tobacco: Never Used     Comment: "I decided to quit today" (11/04/16)  . Alcohol use Yes     Comment: sometimes    Review of Systems  Constitutional: No fever/chills. Eyes: No visual changes. ENT: No sore throat. Cardiovascular: Denies chest pain. Respiratory: Denies shortness of breath. Gastrointestinal: No abdominal pain.  No nausea, no vomiting.  No diarrhea.  No constipation. Genitourinary: Negative for dysuria. Musculoskeletal: Negative for back pain. Skin: Negative for rash. Neurological: Negative for headaches, focal weakness or numbness. Psychiatric:Positive for paranoia and bizarre behavior.  10-point ROS otherwise negative.  ____________________________________________   PHYSICAL EXAM:  VITAL SIGNS: ED Triage Vitals [12/15/16 2157]  Enc Vitals Group     BP 125/89     Pulse Rate (!) 106     Resp 18     Temp      Temp Source Oral  SpO2 98 %     Weight 130 lb (59 kg)     Height 5\' 4"  (1.626 m)     Head Circumference      Peak Flow      Pain Score      Pain Loc      Pain Edu?      Excl. in GC?      Constitutional: Asleep, awakened for exam. Alert and oriented. Well appearing and in no acute distress. Eyes: Conjunctivae are normal. PERRL. EOMI. Head: Atraumatic. Nose: No congestion/rhinnorhea. Mouth/Throat: Mucous membranes are  moist.  Oropharynx non-erythematous. Neck: No stridor.   Cardiovascular: Normal rate, regular rhythm. Grossly normal heart sounds.  Good peripheral circulation. Respiratory: Normal respiratory effort.  No retractions. Lungs CTAB. Gastrointestinal: Soft and nontender. No distention. No abdominal bruits. No CVA tenderness. Musculoskeletal: No lower extremity tenderness nor edema.  No joint effusions. Neurologic:  Normal speech and language. No gross focal neurologic deficits are appreciated. No gait instability. Skin:  Skin is warm, dry and intact. No rash noted. Psychiatric: Mood and affect are flat. Speech and behavior are normal.  ____________________________________________   LABS (all labs ordered are listed, but only abnormal results are displayed)  Labs Reviewed  COMPREHENSIVE METABOLIC PANEL - Abnormal; Notable for the following:       Result Value   Sodium 134 (*)    Potassium 3.4 (*)    Total Protein 8.7 (*)    ALT 12 (*)    Total Bilirubin 1.6 (*)    All other components within normal limits  ACETAMINOPHEN LEVEL - Abnormal; Notable for the following:    Acetaminophen (Tylenol), Serum <10 (*)    All other components within normal limits  URINE DRUG SCREEN, QUALITATIVE ([facility] ONLY) - Abnormal; Notable for the following:    Tricyclic, Ur Screen POSITIVE (*)    Amphetamines, Ur Screen POSITIVE (*)    Cocaine Metabolite,Ur [place] POSITIVE (*)    Cannabinoid 50 Ng, Ur  POSITIVE (*)    All other components within normal limits  URINALYSIS, COMPLETE (UACMP) WITH MICROSCOPIC - Abnormal; Notable for the following:    Color, Urine YELLOW (*)    APPearance CLEAR (*)    Ketones, ur 20 (*)    Protein, ur 30 (*)    Leukocytes, UA LARGE (*)    Squamous Epithelial / LPF 0-5 (*)    All other components within normal limits  ETHANOL  SALICYLATE LEVEL  CBC  POC URINE PREG, ED    ____________________________________________  EKG  None ____________________________________________  RADIOLOGY  None ____________________________________________   PROCEDURES  Procedure(s) performed: None  Procedures  Critical Care performed: No  ____________________________________________   INITIAL IMPRESSION / ASSESSMENT AND PLAN / ED COURSE  Pertinent labs & imaging results that were available during my care of the patient were reviewed by me and considered in my medical decision making (see chart for details).  25 year old female with a history of schizoaffective disorder, bipolar type who presents under involuntary commitment by her mother for paranoia and hallucinations. Laboratory results are unremarkable; noted elevated bilirubin which has been elevated in the past. Patient does not have abdominal pain, jaundice, nausea or vomiting. She is medically cleared at this time pending TTS and psychiatry consults. She will remain in the emergency department under involuntary commitment pending psychiatric disposition. [facility].  Clinical Course as of Dec 16 621  Tue Dec 16, 2016  0620 No events overnight. Fosfomycin administered for  UTI. UDS notable for cocaine metabolites and cannabinoids.  [JS]    Clinical Course User Index [JS] Irean Hong, MD     ____________________________________________   FINAL CLINICAL IMPRESSION(S) / ED DIAGNOSES  Final diagnoses:  Schizoaffective disorder, bipolar type (HCC)  PTSD (post-traumatic stress disorder)  Oppositional defiant disorder  Cocaine abuse  Marijuana abuse  Urinary tract infection without hematuria, site unspecified      NEW MEDICATIONS STARTED DURING THIS VISIT:  New Prescriptions   No medications on file     Note:  This document was prepared using Dragon voice recognition software and may include unintentional dictation errors.    Irean Hong,  MD 12/16/16 [phone]

## 2016-12-15 NOTE — ED Triage Notes (Signed)
Patient ambulatory to triage with steady gait, without difficulty or distress noted, [place] [facility]; pt denies SI ; IVC papers indicates pt with hx schizophrenia and bipolar, having hallucinations

## 2016-12-15 NOTE — ED Notes (Signed)
Paulette, EDT in triage to change pt into behav scrubs

## 2016-12-16 DIAGNOSIS — F14150 Cocaine abuse with cocaine-induced psychotic disorder with delusions: Secondary | ICD-10-CM | POA: Diagnosis not present

## 2016-12-16 LAB — URINALYSIS, COMPLETE (UACMP) WITH MICROSCOPIC
Bacteria, UA: NONE SEEN
Bilirubin Urine: NEGATIVE
GLUCOSE, UA: NEGATIVE mg/dL
HGB URINE DIPSTICK: NEGATIVE
Ketones, ur: 20 mg/dL — AB
NITRITE: NEGATIVE
PH: 5 (ref 5.0–8.0)
Protein, ur: 30 mg/dL — AB
SPECIFIC GRAVITY, URINE: 1.029 (ref 1.005–1.030)

## 2016-12-16 LAB — URINE DRUG SCREEN, QUALITATIVE (ARMC ONLY)
Amphetamines, Ur Screen: POSITIVE — AB
BARBITURATES, UR SCREEN: NOT DETECTED
BENZODIAZEPINE, UR SCRN: NOT DETECTED
CANNABINOID 50 NG, UR ~~LOC~~: POSITIVE — AB
COCAINE METABOLITE, UR ~~LOC~~: POSITIVE — AB
MDMA (Ecstasy)Ur Screen: NOT DETECTED
METHADONE SCREEN, URINE: NOT DETECTED
Opiate, Ur Screen: NOT DETECTED
Phencyclidine (PCP) Ur S: NOT DETECTED
TRICYCLIC, UR SCREEN: POSITIVE — AB

## 2016-12-16 MED ORDER — HYDROXYZINE HCL 25 MG PO TABS
ORAL_TABLET | ORAL | Status: AC
  Administered 2016-12-16: 50 mg via ORAL
  Filled 2016-12-16: qty 2

## 2016-12-16 MED ORDER — HYDROXYZINE HCL 25 MG PO TABS
50.0000 mg | ORAL_TABLET | Freq: Once | ORAL | Status: AC
  Administered 2016-12-16: 50 mg via ORAL

## 2016-12-16 MED ORDER — HYDRALAZINE HCL 50 MG PO TABS
50.0000 mg | ORAL_TABLET | Freq: Once | ORAL | Status: DC
  Filled 2016-12-16: qty 1

## 2016-12-16 MED ORDER — FOSFOMYCIN TROMETHAMINE 3 G PO PACK
3.0000 g | PACK | Freq: Once | ORAL | Status: AC
  Administered 2016-12-16: 3 g via ORAL
  Filled 2016-12-16 (×2): qty 3

## 2016-12-16 NOTE — ED Notes (Signed)
Pt. To BHU from ED ambulatory without difficulty, to room  [facility]. [facility]. Pt. Is alert and oriented, warm and dry in no distress. Pt. Denies SI, HI, and AVH. Pt. Calm and cooperative. Pt. Made aware of security cameras and Q15 minute rounds. Pt. Encouraged to let Nursing staff know of any concerns or needs. \
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
## 2016-12-16 NOTE — ED Notes (Signed)
Pt discharged to lobby. Mother there to pick pt up.. Pt was stable. All papers and belongings returned. Verbal understanding expressed. Denies SI/HI and A/VH. Pt given opportunity to express concerns and ask questions.

## 2016-12-16 NOTE — Discharge Instructions (Signed)
Follow-up with your psychiatrist to provide of [place]. Please avoid illicit substance use. Return for any further problems.
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
## 2016-12-16 NOTE — Consult Note (Signed)
[name]   Reason for Consult:  Consult for 25 year old woman with a history of substance abuse and mental health issues who was brought in under IVC Referring Physician:  Cinda Quest Patient Identification: Bridget C Boxell MRN:  [id number] Principal Diagnosis: Cocaine abuse with cocaine-induced psychotic disorder [facility]) Diagnosis:   Patient Active Problem List   Diagnosis Date Noted  . Cocaine abuse with cocaine-induced psychotic disorder ([place]) [F14.159] 12/16/2016  . Amphetamine and psychostimulant-induced psychosis with delusions ([place]) [F15.950] 12/16/2016  . Borderline personality disorder [F60.3] 11/06/2016  . Cocaine use disorder, severe, dependence ([place]) [F14.20] 11/06/2016  . Cannabis use disorder, moderate, dependence ([place]) [F12.20] 11/06/2016  . Alcohol use disorder, mild, abuse [F10.10] 11/06/2016  . Major depressive disorder, recurrent severe without psychotic features ([place]) [F33.2] 11/06/2016  . Asthma [J45.909] 09/23/2011  . Tobacco use disorder [F17.200] 09/15/2009    Total Time spent with patient: 1 hour  Subjective:   Bridget Carpenter is a 25 y.o. female patient admitted with "I do things that my mom doesn't know about".  HPI:  Patient interviewed. Chart reviewed. 25 year old woman brought in last night under involuntary commitment. History from the family is that she had become acutely psychotic after being away from home for a day or so. They also mentioned that she had recently been started on a new medication by her outpatient provider. The patient says that she had not slept for several days because she was out using cocaine. She also admits that she had taken a very large amount of the Adderall that had been prescribed for her. The intake note from last night documents that she was very disorganized and bizarre in her speech and thinking when she was seen last night. This is pretty typical for how she often presents acutely. Having slept it off  this morning the patient is not disorganized or bizarre in her speech or thinking today and has no memory of her disorganized thinking last night. She admits that she had been paranoid recently although she also says that it's possible that there were good reasons for her to be paranoid. She does not remember having any hallucinations but it's possible that she could've been having some while she was paranoid. Again the patient admits that she is been back to using cocaine, overdosed on her Adderall, and using marijuana area  Social history: Patient lives with her mother and 63-year-old child. Does not work outside the home. Unfortunately still seems to often engage in some self-destructive behavior.  Medical history: Mild asthma. Otherwise no significant medical problems aside from the psychiatric.  Substance abuse history: Long and well-documented history of misuse of drugs primarily cocaine and cannabis. Her diagnosis last time she was here was substance induced psychosis and I think it's becoming more and more clear that that's what's really going on. When she uses cocaine and stimulant she gets disorganized and psychotic which rapidly improves as soon as she clears up. He understands that it's bad for her but she doesn't really engage in treatment as an outpatient.    Past Psychiatric History: Patient has had several prior admissions. She had been diagnosed at one point as having schizoaffective disorder but I think subsequent evaluations have suggested that the substance-induced mood psychotic disorder is probably more correct. Recently she's been going to [facility] for her outpatient treatment and apparently they were prescribing Adderall for her. She does not have a history of suicide attempts or violence.  Risk to Self: Suicidal Ideation: No-Not Currently/Within  Last 6 Months Suicidal Intent: No-Not Currently/Within Last 6 Months Is patient at risk for suicide?: No Suicidal Plan?:  No Access to Means: No What has been your use of drugs/alcohol within the last 12 months?: random use of alcohol, cocaine, and marijuana How many times?: 1 Other Self Harm Risks: denied Triggers for Past Attempts: None known Intentional Self Injurious Behavior: None Risk to Others: Homicidal Ideation: No Thoughts of Harm to Others: No Current Homicidal Intent: No Current Homicidal Plan: No Access to Homicidal Means: No Identified Victim: None identified History of harm to others?: No Assessment of Violence: None Noted Does patient have access to weapons?: No Criminal Charges Pending?: No Does patient have a court date: No Prior Inpatient Therapy: Prior Inpatient Therapy: Yes Prior Therapy Dates: Unknown Prior Therapy Facilty/Provider(s): [facility] Reason for Treatment: depression, Schizoaffective Disorder  Prior Outpatient Therapy: Prior Outpatient Therapy: Yes Prior Therapy Dates: Current Prior Therapy Facilty/Provider(s): [facility] Reason for Treatment: Schizoaffective disorder, Bipolar Disorder Does patient have an ACCT team?: No Does patient have Intensive In-House Services?  : No Does patient have [facility] services? : No Does patient have P4CC services?: No  Past Medical History:  Past Medical History:  Diagnosis Date  . Asthma   . Depression 2009   Inpatient psych admission for SI, dissociative fugue  . Dissociative disorder or reaction 2009  . Eczema   . H/O: suicide attempt   . ODD (oppositional defiant disorder)   . PTSD (post-traumatic stress disorder)   . Schizoaffective disorder ([place])   . Substance abuse     Past Surgical History:  Procedure Laterality Date  . TONSILLECTOMY     Family History:  Family History  Problem Relation Age of Onset  . Adopted: Yes   Family Psychiatric  History: No known psychiatric history Social History:  History  Alcohol Use  . Yes    Comment: sometimes     History  Drug Use No    Social History   Social  History  . Marital status: Single    Spouse name: N/A  . Number of children: N/A  . Years of education: N/A   Social History Main Topics  . Smoking status: Current Every Day Smoker    Packs/day: 0.50    Years: 2.00    Types: Cigarettes  . Smokeless tobacco: Never Used     Comment: "I decided to quit today" (11/04/16)  . Alcohol use Yes     Comment: sometimes  . Drug use: No  . Sexual activity: Yes    Birth control/ protection: Condom   Other Topics Concern  . None   Social History Narrative  . None   Additional Social History:    Allergies:   Allergies  Allergen Reactions  . Banana Hives    Labs:  Results for orders placed or performed during the hospital encounter of 12/15/16 (from the past 48 hour(s))  Urine Drug Screen, Qualitative     Status: Abnormal   Collection Time: 12/15/16  9:09 PM  Result Value Ref Range   Tricyclic, Ur Screen POSITIVE (A) NONE DETECTED   Amphetamines, Ur Screen POSITIVE (A) NONE DETECTED   MDMA (Ecstasy)Ur Screen NONE DETECTED NONE DETECTED   Cocaine Metabolite,Ur [place] POSITIVE (A) NONE DETECTED   Opiate, Ur Screen NONE DETECTED NONE DETECTED   Phencyclidine (PCP) Ur S NONE DETECTED NONE DETECTED   Cannabinoid 50 Ng, Ur [place] POSITIVE (A) NONE DETECTED   Barbiturates, Ur Screen NONE DETECTED NONE DETECTED   Benzodiazepine,  Ur Scrn NONE DETECTED NONE DETECTED   Methadone Scn, Ur NONE DETECTED NONE DETECTED    Comment: (NOTE) 485  Tricyclics, urine               Cutoff 1000 ng/mL 200  Amphetamines, urine             Cutoff 1000 ng/mL 300  MDMA (Ecstasy), urine           Cutoff 500 ng/mL 400  Cocaine Metabolite, urine       Cutoff 300 ng/mL 500  Opiate, urine                   Cutoff 300 ng/mL 600  Phencyclidine (PCP), urine      Cutoff 25 ng/mL 700  Cannabinoid, urine              Cutoff 50 ng/mL 800  Barbiturates, urine             Cutoff 200 ng/mL 900  Benzodiazepine, urine           Cutoff 200 ng/mL 1000 Methadone, urine                 Cutoff 300 ng/mL 1100 1200 The urine drug screen provides only a preliminary, unconfirmed 1300 analytical test result and should not be used for non-medical 1400 purposes. Clinical consideration and professional judgment should 1500 be applied to any positive drug screen result due to possible 1600 interfering substances. A more specific alternate chemical method 1700 must be used in order to obtain a confirmed analytical result.  1800 Gas chromato graphy / mass spectrometry (GC/MS) is the preferred 1900 confirmatory method.   Urinalysis, Complete w Microscopic     Status: Abnormal   Collection Time: 12/15/16  9:09 PM  Result Value Ref Range   Color, Urine YELLOW (A) YELLOW   APPearance CLEAR (A) CLEAR   Specific Gravity, Urine 1.029 1.005 - 1.030   pH 5.0 5.0 - 8.0   Glucose, UA NEGATIVE NEGATIVE mg/dL   Hgb urine dipstick NEGATIVE NEGATIVE   Bilirubin Urine NEGATIVE NEGATIVE   Ketones, ur 20 (A) NEGATIVE mg/dL   Protein, ur 30 (A) NEGATIVE mg/dL   Nitrite NEGATIVE NEGATIVE   Leukocytes, UA LARGE (A) NEGATIVE   RBC / HPF 0-5 0 - 5 RBC/hpf   WBC, UA 6-30 0 - 5 WBC/hpf   Bacteria, UA NONE SEEN NONE SEEN   Squamous Epithelial / LPF 0-5 (A) NONE SEEN   Mucous PRESENT    Amorphous Crystal PRESENT   Comprehensive metabolic panel     Status: Abnormal   Collection Time: 12/15/16 10:02 PM  Result Value Ref Range   Sodium 134 (L) 135 - 145 mmol/L   Potassium 3.4 (L) 3.5 - 5.1 mmol/L   Chloride 102 101 - 111 mmol/L   CO2 22 22 - 32 mmol/L   Glucose, Bld 90 65 - 99 mg/dL   BUN 15 6 - 20 mg/dL   Creatinine, Ser 1.00 0.44 - 1.00 mg/dL   Calcium 9.5 8.9 - 10.3 mg/dL   Total Protein 8.7 (H) 6.5 - 8.1 g/dL   Albumin 4.4 3.5 - 5.0 g/dL   AST 27 15 - 41 U/L   ALT 12 (L) 14 - 54 U/L   Alkaline Phosphatase 56 38 - 126 U/L   Total Bilirubin 1.6 (H) 0.3 - 1.2 mg/dL   GFR calc non Af Amer >60 >60 mL/min   GFR calc Af Amer >60 >60 mL/min  Comment: (NOTE) The eGFR has been calculated  using the CKD EPI equation. This calculation has not been validated in all clinical situations. eGFR's persistently <60 mL/min signify possible Chronic Kidney Disease.    Anion gap 10 5 - 15  Ethanol     Status: None   Collection Time: 12/15/16 10:02 PM  Result Value Ref Range   Alcohol, Ethyl (B) <5 <5 mg/dL    Comment:        LOWEST DETECTABLE LIMIT FOR SERUM ALCOHOL IS 5 mg/dL FOR MEDICAL PURPOSES ONLY   Salicylate level     Status: None   Collection Time: 12/15/16 10:02 PM  Result Value Ref Range   Salicylate Lvl <7.8 2.8 - 30.0 mg/dL  Acetaminophen level     Status: Abnormal   Collection Time: 12/15/16 10:02 PM  Result Value Ref Range   Acetaminophen (Tylenol), Serum <10 (L) 10 - 30 ug/mL    Comment:        THERAPEUTIC CONCENTRATIONS VARY SIGNIFICANTLY. A RANGE OF 10-30 ug/mL MAY BE AN EFFECTIVE CONCENTRATION FOR MANY PATIENTS. HOWEVER, SOME ARE BEST TREATED AT CONCENTRATIONS OUTSIDE THIS RANGE. ACETAMINOPHEN CONCENTRATIONS >150 ug/mL AT 4 HOURS AFTER INGESTION AND >50 ug/mL AT 12 HOURS AFTER INGESTION ARE OFTEN ASSOCIATED WITH TOXIC REACTIONS.   cbc     Status: None   Collection Time: 12/15/16 10:02 PM  Result Value Ref Range   WBC 8.4 3.6 - 11.0 K/uL   RBC 4.57 3.80 - 5.20 MIL/uL   Hemoglobin 14.6 12.0 - 16.0 g/dL   HCT 42.5 35.0 - 47.0 %   MCV 93.0 80.0 - 100.0 fL   MCH 31.8 26.0 - 34.0 pg   MCHC 34.2 32.0 - 36.0 g/dL   RDW 13.1 11.5 - 14.5 %   Platelets 223 150 - 440 K/uL    No current facility-administered medications for this encounter.    Current Outpatient Prescriptions  Medication Sig Dispense Refill  . albuterol (PROVENTIL HFA;VENTOLIN HFA) 108 (90 BASE) MCG/ACT inhaler Inhale 2 puffs into the lungs every 6 (six) hours as needed for wheezing or shortness of breath. 1 Inhaler 2  . ARIPiprazole (ABILIFY) 2 MG tablet Take 1 tablet (2 mg total) by mouth daily. 30 tablet 0  . clobetasol ointment (TEMOVATE) 0.05 % Apply topically 2 (two) times daily.  (Patient not taking: Reported on 11/04/2016) 30 g 0  . mirtazapine (REMERON) 7.5 MG tablet Take 1 tablet (7.5 mg total) by mouth at bedtime. 30 tablet 0    Musculoskeletal: Strength & Muscle Tone: within normal limits Gait & Station: normal Patient leans: N/A  Psychiatric Specialty Exam: Physical Exam  Nursing note and vitals reviewed. Constitutional: She appears well-developed and well-nourished.  HENT:  Head: Normocephalic and atraumatic.  Eyes: Conjunctivae are normal. Pupils are equal, round, and reactive to light.  Neck: Normal range of motion.  Cardiovascular: Regular rhythm and normal heart sounds.   Respiratory: Effort normal. No respiratory distress.  GI: Soft.  Musculoskeletal: Normal range of motion.  Neurological: She is alert.  Skin: Skin is warm and dry.  Psychiatric: Her affect is blunt. Her speech is delayed. She is slowed. Thought content is not paranoid. She expresses impulsivity. She expresses no homicidal and no suicidal ideation. She exhibits abnormal recent memory.    Review of Systems  Constitutional: Negative.   HENT: Negative.   Eyes: Negative.   Respiratory: Negative.   Cardiovascular: Negative.   Gastrointestinal: Negative.   Musculoskeletal: Negative.   Skin: Negative.   Neurological: Negative.  Psychiatric/Behavioral: Positive for memory loss and substance abuse. Negative for depression, hallucinations and suicidal ideas. The patient has insomnia. The patient is not nervous/anxious.     Blood pressure 131/77, pulse 82, temperature 98 F (36.7 C), temperature source Oral, resp. rate 18, height 5' 4" (1.626 m), weight 59 kg (130 lb), last menstrual period 11/15/2016, SpO2 94 %.Body mass index is 22.31 kg/m.  General Appearance: Disheveled  Eye Contact:  Good  Speech:  Slow  Volume:  Decreased  Mood:  Euthymic  Affect:  Constricted  Thought Process:  Goal Directed  Orientation:  Full (Time, Place, and Person)  Thought Content:  Logical   Suicidal Thoughts:  No  Homicidal Thoughts:  No  Memory:  Immediate;   Good Recent;   Poor Remote;   Fair  Judgement:  Impaired  Insight:  Shallow  Psychomotor Activity:  Normal  Concentration:  Concentration: Fair  Recall:  [employer] of Knowledge:  Fair  Language:  Fair  Akathisia:  No  Handed:  Right  AIMS (if indicated):     Assets:  Communication Skills Desire for Improvement Housing Physical Health Social Support  ADL's:  Intact  Cognition:  WNL  Sleep:        Treatment Plan Summary: Plan 25 year old woman who came in psychotic last night but has cleared up today consistent with her history of stimulant-induced psychosis. Patient does not appear to be acutely dangerous to herself or others or to require inpatient treatment. I spent some time educating her about the obvious fact that drug abuse makes her act crazy and puts her at risk. Strongly encouraged her to improve her efforts to stay away from cocaine marijuana and alcohol. I also advised her that in my opinion Adderall and other stimulants are not the correct medicines for her and that she should talk about this with [facility] and should stay away from Adderall in the future. Otherwise patient will be taken off of the IVC and can be discharged. Case reviewed with TTS and emergency room physician.  Disposition: Patient does not meet criteria for psychiatric inpatient admission. Supportive therapy provided about ongoing stressors.  Alethia Berthold, MD 12/16/2016 12:20 PM

## 2016-12-16 NOTE — ED Notes (Signed)

## 2017-03-13 ENCOUNTER — Encounter: Payer: Self-pay | Admitting: Emergency Medicine

## 2017-03-13 ENCOUNTER — Emergency Department: Payer: Medicaid Other

## 2017-03-13 ENCOUNTER — Emergency Department
Admission: EM | Admit: 2017-03-13 | Discharge: 2017-03-13 | Disposition: A | Discharge status: Home / Self Care | Payer: Medicaid Other | Attending: Emergency Medicine | Admitting: Emergency Medicine

## 2017-03-13 DIAGNOSIS — R059 Cough, unspecified: Secondary | ICD-10-CM

## 2017-03-13 DIAGNOSIS — F17210 Nicotine dependence, cigarettes, uncomplicated: Secondary | ICD-10-CM | POA: Diagnosis not present

## 2017-03-13 DIAGNOSIS — G43709 Chronic migraine without aura, not intractable, without status migrainosus: Secondary | ICD-10-CM | POA: Diagnosis not present

## 2017-03-13 DIAGNOSIS — R05 Cough: Secondary | ICD-10-CM | POA: Diagnosis not present

## 2017-03-13 DIAGNOSIS — J45909 Unspecified asthma, uncomplicated: Secondary | ICD-10-CM | POA: Insufficient documentation

## 2017-03-13 DIAGNOSIS — R51 Headache: Secondary | ICD-10-CM | POA: Diagnosis present

## 2017-03-13 LAB — URINALYSIS, ROUTINE W REFLEX MICROSCOPIC
Bacteria, UA: NONE SEEN
Bilirubin Urine: NEGATIVE
GLUCOSE, UA: NEGATIVE mg/dL
Hgb urine dipstick: NEGATIVE
KETONES UR: 5 mg/dL — AB
Leukocytes, UA: NEGATIVE
Nitrite: NEGATIVE
PH: 5 (ref 5.0–8.0)
PROTEIN: 30 mg/dL — AB
Specific Gravity, Urine: 1.026 (ref 1.005–1.030)

## 2017-03-13 LAB — BASIC METABOLIC PANEL
Anion gap: 5 (ref 5–15)
BUN: 17 mg/dL (ref 6–20)
CALCIUM: 8.6 mg/dL — AB (ref 8.9–10.3)
CO2: 24 mmol/L (ref 22–32)
Chloride: 105 mmol/L (ref 101–111)
Creatinine, Ser: 0.72 mg/dL (ref 0.44–1.00)
GFR calc Af Amer: >60 mL/min (ref >60)
GFR calc non Af Amer: >60 mL/min (ref >60)
GLUCOSE: 106 mg/dL — AB (ref 65–99)
Potassium: 4 mmol/L (ref 3.5–5.1)
Sodium: 134 mmol/L — ABNORMAL LOW (ref 135–145)

## 2017-03-13 LAB — POCT PREGNANCY, URINE: PREG TEST UR: NEGATIVE

## 2017-03-13 LAB — CBC WITH DIFFERENTIAL/PLATELET
BASOS PCT: 5 %
Basophils Absolute: 0.4 10*3/uL — ABNORMAL HIGH (ref 0–0.1)
EOS PCT: 7 %
Eosinophils Absolute: 0.5 10*3/uL (ref 0–0.7)
HCT: 39.1 % (ref 35.0–47.0)
Hemoglobin: 13.2 g/dL (ref 12.0–16.0)
Lymphocytes Relative: 15 %
Lymphs Abs: 1.1 10*3/uL (ref 1.0–3.6)
MCH: 30.9 pg (ref 26.0–34.0)
MCHC: 33.8 g/dL (ref 32.0–36.0)
MCV: 91.5 fL (ref 80.0–100.0)
MONO ABS: 0.6 10*3/uL (ref 0.2–0.9)
MONOS PCT: 8 %
Neutro Abs: 4.9 10*3/uL (ref 1.4–6.5)
Neutrophils Relative %: 65 %
PLATELETS: 247 10*3/uL (ref 150–440)
RBC: 4.27 MIL/uL (ref 3.80–5.20)
RDW: 13.4 % (ref 11.5–14.5)
WBC: 7.4 10*3/uL (ref 3.6–11.0)

## 2017-03-13 MED ORDER — BUTALBITAL-APAP-CAFFEINE 50-325-40 MG PO TABS: 1.0000 | ORAL_TABLET | Freq: Four times a day (QID) | ORAL | 0 refills | Status: AC | PRN

## 2017-03-13 MED ORDER — BENZONATATE 100 MG PO CAPS: 100.0000 mg | ORAL_CAPSULE | Freq: Three times a day (TID) | ORAL | 0 refills | Status: DC | PRN

## 2017-03-13 NOTE — ED Triage Notes (Signed)
Pt wants checked for tumor, found a knot on her left rib and is worried. Weakness today.

## 2017-03-13 NOTE — ED Provider Notes (Signed)
Bridget Carpenter ____________________________________________  Time seen: 1601  I have reviewed the triage vital signs and the nursing notes.  HISTORY  Chief Complaint  Cough and Chest Pain ("knot" on left ribs)  HPI Bridget Carpenter is a 26 y.o. female presents to the ED via EMS from the [facility] of [facility], after she reportedly got "weak" and sat down on the floor, while in line. She denies LOC, nausea, vomiting, or injury. She reports eating a carb & protein rich breakfast. She describes feeling hot and sweaty while standing in line. She denies any syncope or near-syncope. She presents now with multiple complaints, including migraine headaches, intermittent cough, and concern for a non-tender "knot" she felt on the left side if her ribsyesterday. She is unable to localize or palpate the knot today. She gives a history of migraine headaches, but admits to discontinuing her previously prescribed Topamax due to ineffectiveness. She has not followed-up with her PCP for continued management. She denies any change from baseline in regards to her migraines. She also has a request for STD screening, but does not want a pelvic exam for collection. She denies any interim fevers, chills, sweats, or bowel changes.   Past Medical History:  Diagnosis Date  . Asthma   . Depression 2009   Inpatient psych admission for SI, dissociative fugue  . Dissociative disorder or reaction 2009  . Eczema   . H/O: suicide attempt   . ODD (oppositional defiant disorder)   . PTSD (post-traumatic stress disorder)   . Schizoaffective disorder ([facility])   . Substance abuse     Patient Active Problem List   Diagnosis Date Noted  . 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 Amphetamine and psychostimulant-induced psychosis with delusions ([facility]) 12/16/2016  . Borderline personality disorder 11/06/2016  . Cocaine use disorder,  severe, dependence ([facility]) 11/06/2016  . Cannabis use disorder, moderate, dependence ([facility]) 11/06/2016  . Alcohol use disorder, mild, abuse 11/06/2016  . Major depressive disorder, recurrent severe without psychotic features ([facility]) 11/06/2016  . Asthma 09/23/2011  . Tobacco use disorder 09/15/2009    Past Surgical History:  Procedure Laterality Date  . TONSILLECTOMY      Prior to Admission medications   Medication Sig Start Date End Date Taking? Authorizing Provider  albuterol (PROVENTIL HFA;VENTOLIN HFA) 108 (90 BASE) MCG/ACT inhaler Inhale 2 puffs into the lungs every 6 (six) hours as needed for wheezing or shortness of breath. 07/21/15   Audery Amel, MD  ARIPiprazole (ABILIFY) 2 MG tablet Take 1 tablet (2 mg total) by mouth daily. 11/12/16   Jimmy Footman, MD  benzonatate (TESSALON PERLES) 100 MG capsule Take 1 capsule (100 mg total) by mouth 3 (three) times daily as needed for cough (Take 1-2 per dose). 03/13/17   Denasia Venn V Carpenter Kynslee Baham, PA-C  butalbital-acetaminophen-caffeine (FIORICET, ESGIC) [phone] MG tablet Take 1 tablet by mouth every 6 (six) hours as needed for headache. 03/13/17 03/18/17  Charlesetta Ivory Lasheba Stevens, PA-C  clobetasol ointment (TEMOVATE) 0.05 % Apply topically 2 (two) times daily. Patient not taking: Reported on 11/04/2016 08/18/16   Shari Prows, MD  mirtazapine (REMERON) 7.5 MG tablet Take 1 tablet (7.5 mg total) by mouth at bedtime. 11/11/16   Jimmy Footman, MD    Allergies Banana  Family History  Problem Relation Age of Onset  . Adopted: Yes    Social History Social History  Substance Use Topics  . Smoking status: Current Every Day Smoker  Packs/day: 0.50    Years: 2.00    Types: Cigarettes  . Smokeless tobacco: Never Used  . Alcohol use Yes     Comment: sometimes    Review of Systems  Constitutional: Negative for fever. Eyes: Negative for visual changes. ENT: Negative for sore throat. Cardiovascular: Negative  for chest pain. Respiratory: Negative for shortness of breath. Reports intermittent cough. Gastrointestinal: Negative for abdominal pain, vomiting and diarrhea. Genitourinary: Negative for dysuria. Reports vaginal discharge Musculoskeletal: Negative for back pain. Skin: Negative for rash. Neurological: Negative for change in baseline headaches, focal weakness or numbness. ____________________________________________  PHYSICAL EXAM:  VITAL SIGNS: ED Triage Vitals [03/13/17 1537]  Enc Vitals Group     BP 102/71     Pulse Rate 93     Resp 18     Temp 98.8 F (37.1 C)     Temp Source Oral     SpO2 96 %     Weight 125 lb (56.7 kg)     Height 5\' 6"  (1.676 m)     Head Circumference      Peak Flow      Pain Score 9     Pain Loc      Pain Edu?      Excl. in GC?     Constitutional: Alert and oriented. Well appearing and in no distress. Head: Normocephalic and atraumatic. Eyes: Conjunctivae are normal. PERRL. Normal extraocular movements Ears: Canals clear. TMs intact bilaterally. Nose: No congestion/rhinorrhea/epistaxis. Mouth/Throat: Mucous membranes are moist. Neck: Supple. No thyromegaly. Cardiovascular: Normal rate, regular rhythm. Normal distal pulses. Respiratory: Normal respiratory effort. No wheezes/rales/rhonchi. Gastrointestinal: Soft and nontender. No distention. Musculoskeletal: Nontender with normal range of motion in all extremities.  Neurologic:  Normal gait without ataxia. Normal speech and language. No gross focal neurologic deficits are appreciated. Skin:  Skin is warm, dry and intact. Diffuse eczematous rash noted.  Psychiatric: Mood and affect are normal. Patient exhibits appropriate insight and judgment. ____________________________________________   LABS (pertinent positives/negatives) Labs Reviewed  URINALYSIS, ROUTINE W REFLEX MICROSCOPIC - Abnormal; Notable for the following:       Result Value   Color, Urine YELLOW (*)    APPearance CLEAR (*)     Ketones, ur 5 (*)    Protein, ur 30 (*)    Squamous Epithelial / LPF 0-5 (*)    All other components within normal limits  BASIC METABOLIC PANEL - Abnormal; Notable for the following:    Sodium 134 (*)    Glucose, Bld 106 (*)    Calcium 8.6 (*)    All other components within normal limits  CBC WITH DIFFERENTIAL/PLATELET - Abnormal; Notable for the following:    Basophils Absolute 0.4 (*)    All other components within normal limits  POC URINE PREG, ED  POCT PREGNANCY, URINE  ____________________________________________   RADIOLOGY  CXR  IMPRESSION: No active cardiopulmonary disease. ____________________________________________  INITIAL IMPRESSION / ASSESSMENT AND PLAN / ED COURSE  Patient with multiple complaints upon arrival via EMS to the ED. Her exam, labs, and CXR are all normal at this time. She is discharged with a prescriptions for Fioricet and Tessalon Perles. She will be discharged to see her provider for further management.  ____________________________________________  FINAL CLINICAL IMPRESSION(S) / ED DIAGNOSES  Final diagnoses:  Cough  Chronic migraine without aura without status migrainosus, not intractable     Lissa HoardJenise V Carpenter Chanci Ojala, PA-C 03/13/17 2304    Sharyn CreamerMark Quale, MD 03/13/17 2349

## 2017-03-13 NOTE — Discharge Instructions (Signed)
Your exam, labs, and chest x-ray are normal today. You should consider cutting back and eventually quitting smoking. Take the prescription meds as needed for cough.  Follow[facility] at [facility] for treatment of your chronic migraines.

## 2017-03-13 NOTE — ED Notes (Signed)
See triage note ..states she has had intermittent headache for 2 weeks  Also noticed a "knot"under left rib area   Denies any trauma ,n/v/d or fever

## 2017-03-27 ENCOUNTER — Emergency Department
Admission: EM | Admit: 2017-03-27 | Discharge: 2017-03-27 | Disposition: A | Discharge status: Home / Self Care | Payer: Medicaid Other | Attending: Emergency Medicine | Admitting: Emergency Medicine

## 2017-03-27 ENCOUNTER — Encounter: Payer: Self-pay | Admitting: Emergency Medicine

## 2017-03-27 DIAGNOSIS — Z202 Contact with and (suspected) exposure to infections with a predominantly sexual mode of transmission: Secondary | ICD-10-CM | POA: Insufficient documentation

## 2017-03-27 DIAGNOSIS — B86 Scabies: Secondary | ICD-10-CM

## 2017-03-27 DIAGNOSIS — J45909 Unspecified asthma, uncomplicated: Secondary | ICD-10-CM | POA: Insufficient documentation

## 2017-03-27 DIAGNOSIS — F17210 Nicotine dependence, cigarettes, uncomplicated: Secondary | ICD-10-CM | POA: Insufficient documentation

## 2017-03-27 DIAGNOSIS — R21 Rash and other nonspecific skin eruption: Secondary | ICD-10-CM | POA: Diagnosis present

## 2017-03-27 DIAGNOSIS — Z711 Person with feared health complaint in whom no diagnosis is made: Secondary | ICD-10-CM

## 2017-03-27 MED ORDER — HYDROXYZINE HCL 25 MG PO TABS: 25.0000 mg | ORAL_TABLET | Freq: Three times a day (TID) | ORAL | 0 refills | Status: DC | PRN

## 2017-03-27 MED ORDER — PERMETHRIN 5 % EX CREA: TOPICAL_CREAM | CUTANEOUS | 1 refills | Status: DC

## 2017-03-27 NOTE — Discharge Instructions (Signed)
Avoid sex until you no longer itch from scabies and until you have been tested and/or treated for STDs.

## 2017-03-27 NOTE — ED Triage Notes (Signed)
Mother says she was diagnosed with MRSA several years ago and believes she has a "blood infection and skin infection"; says she now has a rash all over her body with drainage; pt has shown this nurse several areas of which she's speaking and there is no drainage visualized; pt would also like to be checked for STD's while she's here; afebrile;

## 2017-03-27 NOTE — ED Notes (Signed)
Reviewed d/c instructions, follow-up care, prescriptions with patient. Pt verbalized understanding.  

## 2017-03-27 NOTE — ED Notes (Signed)
Patient c/o generalized rash and itching.

## 2017-03-27 NOTE — ED Provider Notes (Signed)
[facility] Emergency Department Provider Note  ____________________________________________  Time seen: Approximately 09:39 PM  I have reviewed the triage vital signs and the nursing notes.   HISTORY  Chief Complaint Rash   HPI Bridget Carpenter is a 26 y.o. female who presents to the emergency department for evaluation of pruritic rash that has been present for "a long time." She feels like it is a "blood infection" and "skin infection." She has not taken anything over the counter for her symptomst.  Past Medical History:  Diagnosis Date  . Asthma   . Depression 2009   Inpatient psych admission for SI, dissociative fugue  . Dissociative disorder or reaction 2009  . Eczema   . H/O: suicide attempt   . ODD (oppositional defiant disorder)   . PTSD (post-traumatic stress disorder)   . Schizoaffective disorder (HCC)   . Substance abuse     Patient Active Problem List   Diagnosis Date Noted  . 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 Amphetamine and psychostimulant-induced psychosis with delusions (HCC) 12/16/2016  . Borderline personality disorder 11/06/2016  . Cocaine use disorder, severe, dependence (HCC) 11/06/2016  . Cannabis use disorder, moderate, dependence (HCC) 11/06/2016  . Alcohol use disorder, mild, abuse 11/06/2016  . Major depressive disorder, recurrent severe without psychotic features (HCC) 11/06/2016  . Asthma 09/23/2011  . Tobacco use disorder 09/15/2009    Past Surgical History:  Procedure Laterality Date  . ADENOIDECTOMY    . TONSILLECTOMY      Prior to Admission medications   Medication Sig Start Date End Date Taking? Authorizing Provider  hydrOXYzine (ATARAX/VISTARIL) 25 MG tablet Take 1 tablet (25 mg total) by mouth 3 (three) times daily as needed. 03/27/17   Chinita Pester, FNP  permethrin (ELIMITE) 5 % cream Apply to skin from neck to soles of feet and leave on for 10-12 hours, then take  a shower and wash it off. Repeat in 1 week. 03/27/17 03/27/18  Chinita Pester, FNP    Allergies Banana  Family History  Problem Relation Age of Onset  . Adopted: Yes    Social History Social History  Substance Use Topics  . Smoking status: Current Every Day Smoker    Packs/day: 0.50    Years: 2.00    Types: Cigarettes  . Smokeless tobacco: Never Used  . Alcohol use Yes     Comment: sometimes    Review of Systems  Constitutional: Negative for fever/chills Respiratory: Negative for shortness of breath. Musculoskeletal: Negative for pain. Skin: Positive for rash Neurological: Negative for headaches, focal weakness or numbness. ____________________________________________   PHYSICAL EXAM:  VITAL SIGNS: ED Triage Vitals  Enc Vitals Group     BP 03/27/17 2122 113/64     Pulse Rate 03/27/17 2122 75     Resp 03/27/17 2122 18     Temp 03/27/17 2122 98.8 F (37.1 C)     Temp Source 03/27/17 2122 Oral     SpO2 03/27/17 2122 97 %     Weight 03/27/17 2123 126 lb (57.2 kg)     Height --      Head Circumference --      Peak Flow --      Pain Score 03/27/17 2122 10     Pain Loc --      Pain Edu? --      Excl. in GC? --      Constitutional: Alert and oriented.  Eyes: Conjunctivae are normal. EOMI. Mouth/Throat: Mucous  membranes are moist.   Neck: No stridor. Cardiovascular: Good peripheral circulation. Respiratory: Normal respiratory effort.  No retractions.  Musculoskeletal: FROM throughout. Neurologic:  No gross focal neurologic deficits are appreciated. Skin:  Widespread, pinpoint areas consistent with scabies. Area covers body from neck to soles of feet. Excoriated and hyperkeratotic areas noted on hands and ankles without evidence of secondary infection. No erythema or cellulitis noted on exam.  ____________________________________________   LABS (all labs ordered are listed, but only abnormal results are displayed)  Labs Reviewed - No data to  display ____________________________________________  EKG   ____________________________________________  RADIOLOGY   ____________________________________________   PROCEDURES  Procedure(s) performed: None ____________________________________________   INITIAL IMPRESSION / ASSESSMENT AND PLAN / ED COURSE  26 year old female presenting to the emergency department for treatment of pruritic rash and concern for STDs. Rash is widespread over the body and consistent with scabies. She will be given a prescription for permethrin and instructed to use it tomorrow then repeated in one week. She was advised follow-up with her primary care provider in 2 weeks for recheck. For potential STD exposure, patient requested to have "a full screening." She does not wish to discuss her symptoms in front of her daughter, but does not want her daughter to leave the room for a pelvic exam. She was given information for the [facility] Department STD clinic and advised to schedule an appointment. She was advised to return to the emergency department if she experiences any abdominal pain or fever.  Pertinent labs & imaging results that were available during my care of the patient were reviewed by me and considered in my medical decision making (see chart for details).   ____________________________________________   FINAL CLINICAL IMPRESSION(S) / ED DIAGNOSES  Final diagnoses:  Scabies  Concern about STD in female without diagnosis    Discharge Medication List as of 03/27/2017 10:18 PM    START taking these medications   Details  hydrOXYzine (ATARAX/VISTARIL) 25 MG tablet Take 1 tablet (25 mg total) by mouth 3 (three) times daily as needed., Starting Fri 03/27/2017, Print    permethrin (ELIMITE) 5 % cream Apply to skin from neck to soles of feet and leave on for 10-12 hours, then take a shower and wash it off. Repeat in 1 week., Print        If controlled substance prescribed during this  visit, 12 month history viewed on the [facility] prior to issuing an initial prescription for Schedule II or III opiod.   Note:  This document was prepared using Dragon voice recognition software and may include unintentional dictation errors.    Chinita Pester, FNP 03/27/17 1610    Minna Antis, MD 03/29/17 2012

## 2017-05-07 ENCOUNTER — Emergency Department
Admission: EM | Admit: 2017-05-07 | Discharge: 2017-05-07 | Disposition: A | Discharge status: Other Acute Inpatient Hospital | Payer: Medicaid Other | Attending: Emergency Medicine | Admitting: Emergency Medicine

## 2017-05-07 ENCOUNTER — Inpatient Hospital Stay
Admission: AD | Admit: 2017-05-07 | Discharge: 2017-05-11 | DRG: 885 | Disposition: A | Discharge status: Home / Self Care | Payer: Medicaid Other | Source: Intra-hospital | Attending: Psychiatry | Admitting: Psychiatry

## 2017-05-07 ENCOUNTER — Encounter: Payer: Self-pay | Admitting: *Deleted

## 2017-05-07 DIAGNOSIS — R45851 Suicidal ideations: Secondary | ICD-10-CM | POA: Diagnosis present

## 2017-05-07 DIAGNOSIS — Z62810 Personal history of physical and sexual abuse in childhood: Secondary | ICD-10-CM | POA: Diagnosis present

## 2017-05-07 DIAGNOSIS — J45909 Unspecified asthma, uncomplicated: Secondary | ICD-10-CM | POA: Diagnosis not present

## 2017-05-07 DIAGNOSIS — Z9119 Patient's noncompliance with other medical treatment and regimen: Secondary | ICD-10-CM | POA: Diagnosis not present

## 2017-05-07 DIAGNOSIS — F250 Schizoaffective disorder, bipolar type: Secondary | ICD-10-CM

## 2017-05-07 DIAGNOSIS — F332 Major depressive disorder, recurrent severe without psychotic features: Secondary | ICD-10-CM | POA: Diagnosis present

## 2017-05-07 DIAGNOSIS — F17210 Nicotine dependence, cigarettes, uncomplicated: Secondary | ICD-10-CM | POA: Diagnosis present

## 2017-05-07 DIAGNOSIS — F1410 Cocaine abuse, uncomplicated: Secondary | ICD-10-CM | POA: Insufficient documentation

## 2017-05-07 DIAGNOSIS — F603 Borderline personality disorder: Secondary | ICD-10-CM | POA: Diagnosis present

## 2017-05-07 DIAGNOSIS — F1010 Alcohol abuse, uncomplicated: Secondary | ICD-10-CM | POA: Diagnosis present

## 2017-05-07 DIAGNOSIS — F1220 Cannabis dependence, uncomplicated: Secondary | ICD-10-CM | POA: Diagnosis present

## 2017-05-07 DIAGNOSIS — L309 Dermatitis, unspecified: Secondary | ICD-10-CM | POA: Diagnosis present

## 2017-05-07 DIAGNOSIS — F1420 Cocaine dependence, uncomplicated: Secondary | ICD-10-CM | POA: Diagnosis present

## 2017-05-07 DIAGNOSIS — F17200 Nicotine dependence, unspecified, uncomplicated: Secondary | ICD-10-CM | POA: Diagnosis present

## 2017-05-07 DIAGNOSIS — Z79899 Other long term (current) drug therapy: Secondary | ICD-10-CM | POA: Insufficient documentation

## 2017-05-07 LAB — COMPREHENSIVE METABOLIC PANEL
ALBUMIN: 4.3 g/dL (ref 3.5–5.0)
ALK PHOS: 55 U/L (ref 38–126)
ALT: 15 U/L (ref 14–54)
AST: 24 U/L (ref 15–41)
Anion gap: 7 (ref 5–15)
BILIRUBIN TOTAL: 0.8 mg/dL (ref 0.3–1.2)
BUN: 12 mg/dL (ref 6–20)
CO2: 24 mmol/L (ref 22–32)
Calcium: 9.2 mg/dL (ref 8.9–10.3)
Chloride: 110 mmol/L (ref 101–111)
Creatinine, Ser: 0.8 mg/dL (ref 0.44–1.00)
GFR calc Af Amer: >60 mL/min (ref >60)
GFR calc non Af Amer: >60 mL/min (ref >60)
GLUCOSE: 96 mg/dL (ref 65–99)
POTASSIUM: 3.6 mmol/L (ref 3.5–5.1)
Sodium: 141 mmol/L (ref 135–145)
TOTAL PROTEIN: 7.8 g/dL (ref 6.5–8.1)

## 2017-05-07 LAB — CBC
HCT: 39 % (ref 35.0–47.0)
HEMOGLOBIN: 13.3 g/dL (ref 12.0–16.0)
MCH: 31.8 pg (ref 26.0–34.0)
MCHC: 34.1 g/dL (ref 32.0–36.0)
MCV: 93.1 fL (ref 80.0–100.0)
Platelets: 213 10*3/uL (ref 150–440)
RBC: 4.19 MIL/uL (ref 3.80–5.20)
RDW: 13.2 % (ref 11.5–14.5)
WBC: 6.1 10*3/uL (ref 3.6–11.0)

## 2017-05-07 LAB — ETHANOL: Alcohol, Ethyl (B): <5 mg/dL (ref <5)

## 2017-05-07 MED ORDER — HYDROCERIN EX CREA
TOPICAL_CREAM | Freq: Two times a day (BID) | CUTANEOUS | Status: DC
  Administered 2017-05-07: 22:00:00 via TOPICAL
  Administered 2017-05-09: 1 via TOPICAL
  Administered 2017-05-11: 09:00:00 via TOPICAL
  Filled 2017-05-07 (×3): qty 113

## 2017-05-07 MED ORDER — HYDROXYZINE HCL 25 MG PO TABS: 25.0000 mg | ORAL_TABLET | Freq: Three times a day (TID) | ORAL | Status: DC | PRN

## 2017-05-07 MED ORDER — QUETIAPINE FUMARATE 100 MG PO TABS
100.0000 mg | ORAL_TABLET | Freq: Every day | ORAL | Status: DC
  Administered 2017-05-07: 100 mg via ORAL
  Filled 2017-05-07: qty 1

## 2017-05-07 MED ORDER — MAGNESIUM HYDROXIDE 400 MG/5ML PO SUSP: 30.0000 mL | Freq: Every day | ORAL | Status: DC | PRN

## 2017-05-07 MED ORDER — ALUM & MAG HYDROXIDE-SIMETH 200-200-20 MG/5ML PO SUSP: 30.0000 mL | ORAL | Status: DC | PRN

## 2017-05-07 MED ORDER — ACETAMINOPHEN 325 MG PO TABS: 650.0000 mg | ORAL_TABLET | Freq: Four times a day (QID) | ORAL | Status: DC | PRN

## 2017-05-07 NOTE — ED Provider Notes (Signed)
[facility] Emergency Department Provider Note       Time seen: ----------------------------------------- 7:54 AM on 05/07/2017 -----------------------------------------     I have reviewed the triage vital signs and the nursing notes.   HISTORY   Chief Complaint Mental Health Problem    HPI Bridget Carpenter is a 26 y.o. female who presents to the ED for involuntary commitment. She is brought in by [employer] after she got angry with her mother and locked herself in the bathroom. She reportedly picked up a handful of Seroquel and states she was going to overdose. She did not swallow the pills, denies suicidal or homicidal ideation at this time and disputes these events. Patient arrives very angry and upset. Reportedly she uses cocaine daily.   Past Medical History:  Diagnosis Date  . Asthma   . Depression 2009   Inpatient psych admission for SI, dissociative fugue  . Dissociative disorder or reaction 2009  . Eczema   . H/O: suicide attempt   . ODD (oppositional defiant disorder)   . PTSD (post-traumatic stress disorder)   . Schizoaffective disorder (HCC)   . Substance abuse     Patient Active Problem List   Diagnosis Date Noted  . 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 Amphetamine and psychostimulant-induced psychosis with delusions (HCC) 12/16/2016  . Borderline personality disorder 11/06/2016  . Cocaine use disorder, severe, dependence (HCC) 11/06/2016  . Cannabis use disorder, moderate, dependence (HCC) 11/06/2016  . Alcohol use disorder, mild, abuse 11/06/2016  . Major depressive disorder, recurrent severe without psychotic features (HCC) 11/06/2016  . Asthma 09/23/2011  . Tobacco use disorder 09/15/2009    Past Surgical History:  Procedure Laterality Date  . ADENOIDECTOMY    . TONSILLECTOMY      Allergies Banana  Social History Social History  Substance Use Topics  .  Smoking status: Current Every Day Smoker    Packs/day: 0.50    Years: 2.00    Types: Cigarettes  . Smokeless tobacco: Never Used  . Alcohol use Yes     Comment: sometimes    Review of Systems Constitutional: Negative for fever. Eyes: Negative for vision changes ENT:  Negative for congestion, sore throat Cardiovascular: Negative for chest pain. Respiratory: Negative for shortness of breath. Gastrointestinal: Negative for abdominal pain, vomiting and diarrhea. Genitourinary: Negative for dysuria. Musculoskeletal: Negative for back pain. Skin: Positive for chronic skin rash Neurological: Negative for headaches, focal weakness or numbness. Psychiatric: Positive for cocaine abuse, negative for suicidal or homicidal ideation  All systems negative/normal/unremarkable except as stated in the HPI  ____________________________________________   PHYSICAL EXAM:  VITAL SIGNS: ED Triage Vitals  Enc Vitals Group     BP 05/07/17 0716 (!) 128/92     Pulse Rate 05/07/17 0716 66     Resp 05/07/17 0716 20     Temp 05/07/17 0716 97.8 F (36.6 C)     Temp Source 05/07/17 0716 Oral     SpO2 05/07/17 0716 100 %     Weight 05/07/17 0717 125 lb (56.7 kg)     Height 05/07/17 0717 5\' 4"  (1.626 m)     Head Circumference --      Peak Flow --      Pain Score --      Pain Loc --      Pain Edu? --      Excl. in GC? --     Constitutional: Alert and oriented. Well appearing and in no distress.  Eyes: Conjunctivae are normal. PERRL. Normal extraocular movements. ENT   Head: Normocephalic and atraumatic.   Nose: No congestion/rhinnorhea.   Mouth/Throat: Mucous membranes are moist.   Neck: No stridor. Cardiovascular: Normal rate, regular rhythm. No murmurs, rubs, or gallops. Respiratory: Normal respiratory effort without tachypnea nor retractions. Breath sounds are clear and equal bilaterally. No wheezes/rales/rhonchi. Gastrointestinal: Soft and nontender. Normal bowel  sounds Musculoskeletal: Nontender with normal range of motion in extremities. No lower extremity tenderness nor edema. Neurologic:  Normal speech and language. No gross focal neurologic deficits are appreciated.  Skin:  Eczematous skin lesions are noted Psychiatric: Mood and affect are normal. Speech and behavior are normal.  ____________________________________________  ED COURSE:  Pertinent labs & imaging results that were available during my care of the patient were reviewed by me and considered in my medical decision making (see chart for details). Patient presents for Involuntary commitment, we will assess with labs as indicated.   Procedures ____________________________________________   LABS (pertinent positives/negatives)  Labs Reviewed  COMPREHENSIVE METABOLIC PANEL  ETHANOL  CBC  URINE DRUG SCREEN, QUALITATIVE ([facility] ONLY)  POC URINE PREG, ED   ____________________________________________  FINAL ASSESSMENT AND PLAN  Cocaine abuse, involuntary commitment, suicidal gesture  Plan: Patient's labs were dictated above. Patient had presented for Reportedly making gestures towards overdosing. She does have a cocaine abuse history but is otherwise clear for outpatient follow-up and outpatient detox for cocaine abuse   Mayford KnifeWilliams, Cecille AmsterdamJonathan E, MD   Note: This note was generated in part or whole with voice recognition software. Voice recognition is usually quite accurate but there are transcription errors that can and very often do occur. I apologize for any typographical errors that were not detected and corrected.     Emily FilbertWilliams, Jonathan E, MD 05/07/17 [phone]

## 2017-05-07 NOTE — Consult Note (Signed)
Bridget Carpenter Consult   Reason for Consult:  Consult for 26 year old woman with a history of chronic mood and psychotic symptoms as well as substance abuse Referring Physician:  Jimmye Norman Patient Identification: Bridget Carpenter MRN:  [id number] Principal Diagnosis: Schizoaffective disorder [facility]) Diagnosis:   Patient Active Problem List   Diagnosis Date Noted  . Schizoaffective disorder ([place]) [F25.9] 05/07/2017  . Cocaine abuse with cocaine-induced psychotic disorder ([place]) [F14.159] 12/16/2016  . Amphetamine and psychostimulant-induced psychosis with delusions ([place]) [F15.950] 12/16/2016  . Borderline personality disorder [F60.3] 11/06/2016  . Cocaine use disorder, severe, dependence ([place]) [F14.20] 11/06/2016  . Cannabis use disorder, moderate, dependence ([place]) [F12.20] 11/06/2016  . Alcohol use disorder, mild, abuse [F10.10] 11/06/2016  . Major depressive disorder, recurrent severe without psychotic features ([place]) [F33.2] 11/06/2016  . Asthma [J45.909] 09/23/2011  . Tobacco use disorder [F17.200] 09/15/2009    Total Time spent with patient: 1 hour  Subjective:   Bridget C Bettinger is a 26 y.o. female patient admitted with "my mom and I just got arguing".  HPI:  Patient interviewed chart reviewed. Patient at first was difficult to interview because she was so sleepy but gradually she was able to wake up and participate. She was brought in under involuntary commitment filed by law enforcement reporting that she had threatened to overdose or perhaps actually overdosed on some of her pills and made suicidal statements. Patient says her mood has been up and down labile all over the place recently. Feels stressed out most of the time. Sleeps poorly at night often sleeping more during the day than at night. She admits to having auditory hallucinations at times. She has been off of all of her psychiatric medicines probably for several months. Hasn't been following up with any outpatient  treatment. Last night she got into an argument with her mother in the early hours of the morning and when law enforcement were called she said she stuck some Seroquel pills into her mouth. She claims she did not swallow them but spit them out in that she was not intending to kill her self. Patient admits that she has used cocaine and marijuana recently but minimizes the effect of this.  Substance abuse history: Patient has a history of cocaine and marijuana abuse often that worsen her psychotic symptoms.  Social history: Currently living with her mother and her own 69-year-old daughter. Not working outside the home. Does very little during the day. Chronic butting heads with the mother.  Medical history: She looks quite underweight and her eczema is bad with lots of scaly rash all over her body.  Past Psychiatric History: Patient has had several prior admissions and multiple prior presentations similar symptoms. She has been variously diagnosed as psychotic depression, schizophrenia, substance-induced psychosis and just substance abuse and personality disorder. Has gotten some benefit from antipsychotics. Several have been tried including risperidone Abilify and Seroquel. Has a history of suicidal threats denies that she is actually tried to kill her self in the past.  Risk to Self:   Risk to Others:   Prior Inpatient Therapy:   Prior Outpatient Therapy:    Past Medical History:  Past Medical History:  Diagnosis Date  . Asthma   . Depression 2009   Inpatient psych admission for SI, dissociative fugue  . Dissociative disorder or reaction 2009  . Eczema   . H/O: suicide attempt   . ODD (oppositional defiant disorder)   . PTSD (post-traumatic stress disorder)   . Schizoaffective disorder ([place])   .  Substance abuse     Past Surgical History:  Procedure Laterality Date  . ADENOIDECTOMY    . TONSILLECTOMY     Family History:  Family History  Problem Relation Age of Onset  . Adopted: Yes     Family Psychiatric  History: She claims that her mother has mental health problems as well. Social History:  History  Alcohol Use  . Yes    Comment: sometimes     History  Drug Use No    Social History   Social History  . Marital status: Single    Spouse name: N/A  . Number of children: N/A  . Years of education: N/A   Social History Main Topics  . Smoking status: Current Every Day Smoker    Packs/day: 0.50    Years: 2.00    Types: Cigarettes  . Smokeless tobacco: Never Used  . Alcohol use Yes     Comment: sometimes  . Drug use: No  . Sexual activity: Yes    Birth control/ protection: Condom   Other Topics Concern  . Not on file   Social History Narrative  . No narrative on file   Additional Social History:    Allergies:   Allergies  Allergen Reactions  . Banana Hives    Labs:  Results for orders placed or performed during the hospital encounter of 05/07/17 (from the past 48 hour(s))  Comprehensive metabolic panel     Status: None   Collection Time: 05/07/17  7:29 AM  Result Value Ref Range   Sodium 141 135 - 145 mmol/L   Potassium 3.6 3.5 - 5.1 mmol/L   Chloride 110 101 - 111 mmol/L   CO2 24 22 - 32 mmol/L   Glucose, Bld 96 65 - 99 mg/dL   BUN 12 6 - 20 mg/dL   Creatinine, Ser 0.80 0.44 - 1.00 mg/dL   Calcium 9.2 8.9 - 10.3 mg/dL   Total Protein 7.8 6.5 - 8.1 g/dL   Albumin 4.3 3.5 - 5.0 g/dL   AST 24 15 - 41 U/L   ALT 15 14 - 54 U/L   Alkaline Phosphatase 55 38 - 126 U/L   Total Bilirubin 0.8 0.3 - 1.2 mg/dL   GFR calc non Af Amer >60 >60 mL/min   GFR calc Af Amer >60 >60 mL/min    Comment: (NOTE) The eGFR has been calculated using the CKD EPI equation. This calculation has not been validated in all clinical situations. eGFR's persistently <60 mL/min signify possible Chronic Kidney Disease.    Anion gap 7 5 - 15  Ethanol     Status: None   Collection Time: 05/07/17  7:29 AM  Result Value Ref Range   Alcohol, Ethyl (B) <5 <5 mg/dL     Comment:        LOWEST DETECTABLE LIMIT FOR SERUM ALCOHOL IS 5 mg/dL FOR MEDICAL PURPOSES ONLY   cbc     Status: None   Collection Time: 05/07/17  7:29 AM  Result Value Ref Range   WBC 6.1 3.6 - 11.0 K/uL   RBC 4.19 3.80 - 5.20 MIL/uL   Hemoglobin 13.3 12.0 - 16.0 g/dL   HCT 39.0 35.0 - 47.0 %   MCV 93.1 80.0 - 100.0 fL   MCH 31.8 26.0 - 34.0 pg   MCHC 34.1 32.0 - 36.0 g/dL   RDW 13.2 11.5 - 14.5 %   Platelets 213 150 - 440 K/uL    No current facility-administered medications for this encounter.  Musculoskeletal: Strength & Muscle Tone: within normal limits Gait & Station: normal Patient leans: N/A  Psychiatric Specialty Exam: Physical Exam  Nursing note and vitals reviewed. Constitutional: She appears well-developed and well-nourished.  HENT:  Head: Normocephalic and atraumatic.  Eyes: Conjunctivae are normal. Pupils are equal, round, and reactive to light.  Neck: Normal range of motion.  Cardiovascular: Regular rhythm and normal heart sounds.   Respiratory: Effort normal. No respiratory distress.  GI: Soft.  Musculoskeletal: Normal range of motion.  Neurological: She is alert.  Skin: Skin is warm and dry.     Psychiatric: Her mood appears anxious. Her speech is delayed. She is slowed and withdrawn. Thought content is paranoid. She expresses impulsivity. She expresses no suicidal ideation. She exhibits abnormal recent memory.    Review of Systems  Constitutional: Positive for weight loss.  HENT: Negative.   Eyes: Negative.   Respiratory: Negative.   Cardiovascular: Negative.   Gastrointestinal: Negative.   Musculoskeletal: Negative.   Skin: Negative.   Neurological: Positive for weakness.  Psychiatric/Behavioral: Positive for depression, hallucinations, memory loss, substance abuse and suicidal ideas. The patient is nervous/anxious and has insomnia.     Last menstrual period 05/07/2017.There is no height or weight on file to calculate BMI.  General  Appearance: Disheveled  Eye Contact:  Minimal  Speech:  Slow  Volume:  Decreased  Mood:  Anxious and Dysphoric  Affect:  Labile  Thought Process:  Goal Directed  Orientation:  Full (Time, Place, and Person)  Thought Content:  Illogical, Rumination and Tangential  Suicidal Thoughts:  No  Homicidal Thoughts:  No  Memory:  Immediate;   Good Recent;   Fair Remote;   Fair  Judgement:  Impaired  Insight:  Shallow  Psychomotor Activity:  Decreased  Concentration:  Concentration: Poor  Recall:  [employer] of Knowledge:  Fair  Language:  Fair  Akathisia:  No  Handed:  Right  AIMS (if indicated):     Assets:  Desire for Improvement Housing Physical Health Resilience  ADL's:  Impaired  Cognition:  Impaired,  Mild  Sleep:        Treatment Plan Summary: Daily contact with patient to assess and evaluate symptoms and progress in treatment, Medication management and Plan Patient appears to be withdrawn and somewhat disorganized in her thinking mood is labile hallucinations are present. Patient is not currently following up with outpatient treatment. I think she could really benefit from being in the hospital. A pulled involuntary commitment. Orders will be completed and she can be admitted to the hospital for inpatient treatment. 15 minute checks. Labs.  Disposition: Recommend psychiatric Inpatient admission when medically cleared. Supportive therapy provided about ongoing stressors.  Alethia Berthold, MD 05/07/2017 3:38 PM

## 2017-05-07 NOTE — ED Notes (Signed)
[facility] by Dr. Toni Amendlapacs.  Attending Physician will be Dr. Ardyth HarpsHernandez.   Patient [facility] room [facility] Charge Nurse [place] .   [facility] Patient Access staff is aware of the admission. Call report to [phone].  Representative was [facility].

## 2017-05-07 NOTE — Tx Team (Signed)
Initial Treatment Plan 05/07/2017 4:21 PM [name] Poppe [id number]    PATIENT STRESSORS: Marital or family conflict Substance abuse   PATIENT STRENGTHS: Capable of independent living General fund of knowledge   PATIENT IDENTIFIED PROBLEMS: Suicidal Ideation  Substance Abuse                   DISCHARGE CRITERIA:  Improved stabilization in mood, thinking, and/or behavior  PRELIMINARY DISCHARGE PLAN: Outpatient therapy  PATIENT/FAMILY INVOLVEMENT: This treatment plan has been presented to and reviewed with the patient, [name] Swartzlander, and/or family member, .  The patient and family have been given the opportunity to ask questions and make suggestions.  Shelia MediaJones, Malee Grays, RN 05/07/2017, 4:21 PM

## 2017-05-07 NOTE — Progress Notes (Signed)
Patient admitted to unit with flat affect, irritable mood. Argumentative. Forwards little. Denies SI, HI, AVH. Pt angry about being transferred to unit. Would not answer patient questions. Skin and contraband search completed and witnessed by Jamesetta SoPhyllis, Charity fundraiserN. Pt has eczema throughout body, dry scaly itchy skin. No contraband found. Admission assessment complete. Oriented patient to room and unit. Pt currently waiting for supper tray. Remains safe on unit with q 15 min checks.

## 2017-05-07 NOTE — ED Triage Notes (Signed)
Pt brought in by [employer] under IVC, pt uses cocaine daily, pt got angry at mother locked herself in the bathroom took a handful of Seroquel and stated she was going to overdose, pt did not swallow pills, pt denies SI/HI, pt is angry in triage

## 2017-05-07 NOTE — Progress Notes (Signed)
Patient isolative in bed. Alert and oriented but guarded, avoiding to talk to others. Denying suicidal/homicidal thoughts. Denying hallucinations. Not answering assessment questions. Was encouraged to get out of room and get involved in unit activities as tolerated. Therapeutic milieu promoted. Safety precautions maintained.

## 2017-05-07 NOTE — ED Notes (Signed)
Pt given meal tray.

## 2017-05-07 NOTE — ED Notes (Signed)
Pt given a breakfast tray.

## 2017-05-07 NOTE — [facility] Group Notes (Signed)
[facility] Group Notes:  (Nursing/MHT/Case Management/Adjunct)  Date:  05/07/2017  Time:  10:22 PM  Type of Therapy:  Group Therapy  Participation Level:  Did Not Attend  Participation Quality:Summary of Progress/Problems:  Bridget NeerJackie L Delsy Carpenter 05/07/2017, 10:22 PM

## 2017-05-07 NOTE — ED Notes (Signed)
Pt given 3 blankets per her request.

## 2017-05-08 ENCOUNTER — Encounter: Payer: Self-pay | Admitting: Psychiatry

## 2017-05-08 DIAGNOSIS — L309 Dermatitis, unspecified: Secondary | ICD-10-CM

## 2017-05-08 DIAGNOSIS — F332 Major depressive disorder, recurrent severe without psychotic features: Principal | ICD-10-CM

## 2017-05-08 MED ORDER — MIRTAZAPINE 15 MG PO TABS
7.5000 mg | ORAL_TABLET | Freq: Every day | ORAL | Status: DC
  Administered 2017-05-08 – 2017-05-09 (×2): 7.5 mg via ORAL
  Filled 2017-05-08: qty 2
  Filled 2017-05-08: qty 1

## 2017-05-08 MED ORDER — NICOTINE 21 MG/24HR TD PT24
21.0000 mg | MEDICATED_PATCH | Freq: Every day | TRANSDERMAL | Status: DC
  Filled 2017-05-08 (×4): qty 1

## 2017-05-08 MED ORDER — ARIPIPRAZOLE 2 MG PO TABS
2.0000 mg | ORAL_TABLET | Freq: Every day | ORAL | Status: DC
  Administered 2017-05-09 – 2017-05-11 (×2): 2 mg via ORAL
  Filled 2017-05-08 (×5): qty 1

## 2017-05-08 MED ORDER — ALBUTEROL SULFATE HFA 108 (90 BASE) MCG/ACT IN AERS
1.0000 | INHALATION_SPRAY | RESPIRATORY_TRACT | Status: DC | PRN
  Filled 2017-05-08: qty 6.7

## 2017-05-08 NOTE — Progress Notes (Signed)
Initial Nutrition Assessment  DOCUMENTATION CODES:   Underweight  INTERVENTION:   Ensure Enlive po BID, each supplement provides 350 kcal and 20 grams of protein  NUTRITION DIAGNOSIS:   Unintentional weight loss related to other (see comment) (Schizoaffective disorder and drug abuse) as evidenced by 27% percent weight loss in 9 months.  GOAL:   Patient will meet greater than or equal to 90% of their needs  MONITOR:   PO intake, Supplement acceptance, Weight trends  REASON FOR ASSESSMENT:   Malnutrition Screening Tool   ASSESSMENT:   26 y/o female with h/o Schizoaffective disorder and drug abuse admitted IVC  Patient documented eating 0% meals today but ate 100% of meal on 5/10. Per RN notes, pt angry at times, guarded, and not willing to speak to staff. Per chart, pt with 44lb(27%) in 9 months; this is severe. RD suspects wt loss is related to drug abuse and Schizoaffective disorder. RD will order Ensure as pt is underweight. RD will defer visit at this time and follow up with pt when her mood improves.   Diet Order:  Diet regular Room service appropriate? Yes; Fluid consistency: Thin  Skin:  Reviewed, no issues  Last BM:  none since admit  Height:   Ht Readings from Last 1 Encounters:  05/07/17 5\' 4"  (1.626 m)    Weight:   Wt Readings from Last 1 Encounters:  05/07/17 116 lb (52.6 kg)    Ideal Body Weight:  54.5 kg  BMI:  Body mass index is 19.91 kg/m.  Estimated Nutritional Needs:   Kcal:  1600-1900kcal/day   Protein:  63-74g/day   Fluid:  >1.6L/day   EDUCATION NEEDS:   No education needs identified at this time  Betsey Holidayasey Crestina Strike MS, RD, LDN Pager #- [phone]

## 2017-05-08 NOTE — Plan of Care (Signed)
Problem: Medication: Goal: Compliance with prescribed medication regimen will improve Outcome: Not Progressing Refusing medications

## 2017-05-08 NOTE — Progress Notes (Signed)
Recreation Therapy Notes  At approximately 11:42 am, LRT attempted assessment. Patient opened her eyes but would not speak to LRT.  Jacquelynn CreeGreene,Robert Sunga M, LRT/CTRS 05/08/2017 11:45 AM

## 2017-05-08 NOTE — Plan of Care (Signed)
Problem: Coping: Goal: Ability to verbalize frustrations and anger appropriately will improve Outcome: Not Progressing Guarded, avoiding others, not willing to discuss her feelings

## 2017-05-08 NOTE — Tx Team (Signed)
Interdisciplinary Treatment and Diagnostic Plan Update  05/08/2017 Time of Session: [name] MRN: [id number]  Principal Diagnosis: Major depressive disorder, recurrent severe without psychotic features ([place])  Secondary Diagnoses: Principal Problem:   Major depressive disorder, recurrent severe without psychotic features ([place]) Active Problems:   Tobacco use disorder   Asthma   Borderline personality disorder   Cocaine use disorder, severe, dependence ([place])   Cannabis use disorder, moderate, dependence ([place])   Alcohol use disorder, mild, abuse   Eczema   Current Medications:  Current Facility-Administered Medications  Medication Dose Route Frequency Provider Last Rate Last Dose  . acetaminophen (TYLENOL) tablet 650 mg  650 mg Oral Q6H PRN Clapacs, John T, MD      . albuterol (PROVENTIL HFA;VENTOLIN HFA) 108 (90 Base) MCG/ACT inhaler 1-2 puff  1-2 puff Inhalation Q4H PRN Hildred Priest, MD      . alum & mag hydroxide-simeth (MAALOX/MYLANTA) 200-200-20 MG/5ML suspension 30 mL  30 mL Oral Q4H PRN Clapacs, John T, MD      . ARIPiprazole (ABILIFY) tablet 2 mg  2 mg Oral Daily Hernandez-Gonzalez, Seth Bake, MD      . hydrocerin (EUCERIN) cream   Topical BID Clapacs, John T, MD      . magnesium hydroxide (MILK OF MAGNESIA) suspension 30 mL  30 mL Oral Daily PRN Clapacs, John T, MD      . mirtazapine (REMERON) tablet 7.5 mg  7.5 mg Oral QHS Hernandez-Gonzalez, Seth Bake, MD      . nicotine (NICODERM CQ - dosed in mg/24 hours) patch 21 mg  21 mg Transdermal Daily Hildred Priest, MD       PTA Medications: Prescriptions Prior to Admission  Medication Sig Dispense Refill Last Dose  . butalbital-acetaminophen-caffeine (FIORICET, ESGIC) 50-325-40 MG tablet Take 1 tablet by mouth 2 (two) times daily as needed for headache.   unknown at unknown  . gabapentin (NEURONTIN) 100 MG capsule Take 100 mg by mouth 3 (three) times daily.   unknown at unknown  . hydrOXYzine  (ATARAX/VISTARIL) 25 MG tablet Take 1 tablet (25 mg total) by mouth 3 (three) times daily as needed. 30 tablet 0 unknown at unknown  . permethrin (ELIMITE) 5 % cream Apply to skin from neck to soles of feet and leave on for 10-12 hours, then take a shower and wash it off. Repeat in 1 week. 60 g 1 unknown at unknown    Patient Stressors: Marital or family conflict Substance abuse  Patient Strengths: Capable of independent living General fund of knowledge  Treatment Modalities: Medication Management, Group therapy, Case management,  1 to 1 session with clinician, Psychoeducation, Recreational therapy.   Physician Treatment Plan for Primary Diagnosis: Major depressive disorder, recurrent severe without psychotic features ([place]) Long Term Goal(s): Improvement in symptoms so as ready for discharge Improvement in symptoms so as ready for discharge   Short Term Goals: Ability to verbalize feelings will improve Ability to demonstrate self-control will improve Ability to identify and develop effective coping behaviors will improve Compliance with prescribed medications will improve Ability to identify triggers associated with substance abuse/mental health issues will improve  Medication Management: Evaluate patient's response, side effects, and tolerance of medication regimen.  Therapeutic Interventions: 1 to 1 sessions, Unit Group sessions and Medication administration.  Evaluation of Outcomes: Not Met  Physician Treatment Plan for Secondary Diagnosis: Principal Problem:   Major depressive disorder, recurrent severe without psychotic features ([place]) Active Problems:   Tobacco use disorder   Asthma   Borderline personality disorder  Cocaine use disorder, severe, dependence ([facility])   Cannabis use disorder, moderate, dependence ([facility])   Alcohol use disorder, mild, abuse   Eczema  Long Term Goal(s): Improvement in symptoms so as ready for discharge Improvement in symptoms so as ready for  discharge   Short Term Goals: Ability to verbalize feelings will improve Ability to demonstrate self-control will improve Ability to identify and develop effective coping behaviors will improve Compliance with prescribed medications will improve Ability to identify triggers associated with substance abuse/mental health issues will improve     Medication Management: Evaluate patient's response, side effects, and tolerance of medication regimen.  Therapeutic Interventions: 1 to 1 sessions, Unit Group sessions and Medication administration.  Evaluation of Outcomes: Not Met   RN Treatment Plan for Primary Diagnosis: Major depressive disorder, recurrent severe without psychotic features ([place]) Long Term Goal(s): Knowledge of disease and therapeutic regimen to maintain health will improve  Short Term Goals: Ability to verbalize feelings will improve, Ability to identify and develop effective coping behaviors will improve and Compliance with prescribed medications will improve  Medication Management: RN will administer medications as ordered by provider, will assess and evaluate patient's response and provide education to patient for prescribed medication. RN will report any adverse and/or side effects to prescribing provider.  Therapeutic Interventions: 1 on 1 counseling sessions, Psychoeducation, Medication administration, Evaluate responses to treatment, Monitor vital signs and CBGs as ordered, Perform/monitor CIWA, COWS, AIMS and Fall Risk screenings as ordered, Perform wound care treatments as ordered.  Evaluation of Outcomes: Not Met   LCSW Treatment Plan for Primary Diagnosis: Major depressive disorder, recurrent severe without psychotic features ([place]) Long Term Goal(s): Safe transition to appropriate next level of care at discharge, Engage patient in therapeutic group addressing interpersonal concerns.  Short Term Goals: Engage patient in aftercare planning with referrals and resources  and Increase skills for wellness and recovery  Therapeutic Interventions: Assess for all discharge needs, 1 to 1 time with Social worker, Explore available resources and support systems, Assess for adequacy in community support network, Educate family and significant other(s) on suicide prevention, Complete Psychosocial Assessment, Interpersonal group therapy.  Evaluation of Outcomes: Not Met   Progress in Treatment: Attending groups: No. Participating in groups: No. Taking medication as prescribed: Yes. Toleration medication: Yes. Family/Significant other contact made: No, will contact:  mother Patient understands diagnosis: Yes. Discussing patient identified problems/goals with staff: No. Medical problems stabilized or resolved: Yes. Denies suicidal/homicidal ideation: Yes. Issues/concerns per patient self-inventory: No. Other: none  New problem(s) identified: No, Describe:  none  New Short Term/Long Term Goal(s):  Discharge Plan or Barriers: CSW assessing for appropriate plan.  Reason for Continuation of Hospitalization: Medication stabilization  Estimated Length of Stay: 4-5 days.  Attendees: Patient: Pt refused to come 05/08/2017   Physician: Dr. Jerilee Hoh, MD 05/08/2017   Nursing: Elige Radon, RN 05/08/2017   RN Care Manager: 05/08/2017   Social Worker: Lurline Idol, LCSW 05/08/2017   Recreational Therapist: Everitt Amber, LRT/CTRS  05/08/2017   Other:  05/08/2017   Other:  05/08/2017   Other: 05/08/2017        Scribe for Treatment Team: Joanne Chars, [place] 05/08/2017 3:13 PM
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
## 2017-05-08 NOTE — Progress Notes (Signed)
Recreation Therapy Notes  Date: 05.11.18 Time: 9:30 am Location: Craft Room  Group Topic: Coping Skills  Goal Area(s) Addresses:  Patient will participate in healthy coping skill. Patient will verbalize what makes art a good coping skill.  Behavioral Response: Did not attend  Intervention: Coloring  Activity: Patients were given coloring sheets to color and were instructed to think of what emotions they were feeling as well as what their minds were focused on.  Education: LRT educated patients on healthy coping skills.  Education Outcome: Patient did not attend group.  Clinical Observations/Feedback: Patient did not attend group.  Jacquelynn CreeGreene,Jesi Jurgens M, LRT/CTRS 05/08/2017 10:20 AM

## 2017-05-08 NOTE — Plan of Care (Signed)
Problem: Safety: Goal: Ability to remain free from injury will improve Outcome: Progressing No injury. Safety precautions taken and maintained

## 2017-05-08 NOTE — Progress Notes (Signed)
[facility] MD Progress Note  05/08/2017 1:57 PM Bridget Carpenter  MRN:  [id number] Subjective:  Patient is a 26 year old single African-American female from [place]. She is well-known to our service as she has been admitted a multitude of times. She carries a diagnosis of depression, cannabis-cocaine dependence and borderline personality disorder.   Patient was brought into our emergency department by [facility] police under involuntary commitment on May 10. Per the ER notes the patient had been using cocaine daily she got into an argument with her mother and locked herself in the bathroom and took a handful of Seroquel. She was planning on overdosing but did not swallow the pills.  Urine toxicology was not completed in the emergency department. Her alcohol level was below the detection limit.  Since admission the patient has been uncooperative, which is her usual presentation. She refuse to fully cooperate with assessment. She tells me she was to be discharged as she is not suicidal and does not have any thoughts about wanting to harm anyone else. She says she did not try to harm herself. She is denying having any auditory or visual hallucinations. Says that she plans to stay in her room throughout the hospital stay. She does not plan to participate in any groups and does not want to eat in the day room. "I will not leave this room even if I starve"  05/09/17 More cooperative today. Still found in her room sleeping late in the morning. She denies depression, suicidality, homicidality or auditory visual hallucinations. She denies problems with mood, sleep, appetite, energy or concentration. Still wanting to be discharged. She was somewhat somatic today. She says that her skin is infected and wanted to press be prescribed clindamycin. She also thinks that she might have an STD but is not sure. She was unable to tell me whether she had a malodorous discharge or not.  Per nursing: Denies  SI/HI/AVH. Denies pain. Compliant with evening medications. Pt appears to minimize situation. Ate evening snack. Forwards little at this time. Encouragement and support offered. Safety maintained. Will continue to monitor.   Isolates to his room.  Denies SI/HI/AVH.  No group attendance.  Refuses medication. Did not eat breakfast or lunch.  Support offered.  Safety maintained.    Principal Problem: Major depressive disorder, recurrent severe without psychotic features ([place]) Diagnosis:   Patient Active Problem List   Diagnosis Date Noted  . Eczema [L30.9] 05/08/2017  . Borderline personality disorder [F60.3] 11/06/2016  . Cocaine use disorder, severe, dependence ([place]) [F14.20] 11/06/2016  . Cannabis use disorder, moderate, dependence ([place]) [F12.20] 11/06/2016  . Alcohol use disorder, mild, abuse [F10.10] 11/06/2016  . Major depressive disorder, recurrent severe without psychotic features ([place]) [F33.2] 11/06/2016  . Asthma [J45.909] 09/23/2011  . Tobacco use disorder [F17.200] 09/15/2009   Total Time spent with patient: 30 minutes  Past Psychiatric History: Multiple hospitalization due to suicidality in the setting of intoxication with cocaine along with some psychotic symptoms. Says she has been hospitalized about 20 times. She stated that she has tried in the past to jump in front of cars, jumping out of cars. She also states she has overdosed on medications in the past with the attempt to kill herself. The patient reports history of self injury by cutting.  As far as diagnosis says that she has been diagnosed in the past with schizophrenia, and bipolar. Per the chart that she has been given diagnosis of ADHD and oppositional defiant disorder as a child along  with issues related to substance abuse.  Not currently on any medications and not seeing a psychiatrist  Per chart she has a long history of noncompliance with outpatient treatment or medications prescribed in the past.  Past  Medical History:  Past Medical History:  Diagnosis Date  . Asthma   . Depression 2009   Inpatient psych admission for SI, dissociative fugue  . Dissociative disorder or reaction 2009  . Eczema   . H/O: suicide attempt   . ODD (oppositional defiant disorder)   . PTSD (post-traumatic stress disorder)   . Schizoaffective disorder (HCC)   . Substance abuse     Past Surgical History:  Procedure Laterality Date  . ADENOIDECTOMY    . TONSILLECTOMY     Family History:  Family History  Problem Relation Age of Onset  . Adopted: Yes   Family Psychiatric  History: Her biological mother has a history of a unknown type of mental illness.   Social History: She lives with her adoptive mother and her 56-year-old daughter. She has Medicaid.Patient states she is currently unemployed. During her past admission in November she was on parole says she is not longer on parole History  Alcohol Use  . Yes    Comment: sometimes     History  Drug Use No    Social History   Social History  . Marital status: Single    Spouse name: N/A  . Number of children: N/A  . Years of education: N/A   Social History Main Topics  . Smoking status: Current Every Day Smoker    Packs/day: 0.50    Years: 2.00    Types: Cigarettes  . Smokeless tobacco: Never Used  . Alcohol use Yes     Comment: sometimes  . Drug use: No  . Sexual activity: Yes    Birth control/ protection: Condom   Other Topics Concern  . None   Social History Narrative  . None     Current Medications: Current Facility-Administered Medications  Medication Dose Route Frequency Provider Last Rate Last Dose  . acetaminophen (TYLENOL) tablet 650 mg  650 mg Oral Q6H PRN Clapacs, John T, MD      . albuterol (PROVENTIL HFA;VENTOLIN HFA) 108 (90 Base) MCG/ACT inhaler 1-2 puff  1-2 puff Inhalation Q4H PRN Jimmy Footman, MD      . alum & mag hydroxide-simeth (MAALOX/MYLANTA) 200-200-20 MG/5ML suspension 30 mL  30 mL Oral Q4H  PRN Clapacs, John T, MD      . ARIPiprazole (ABILIFY) tablet 2 mg  2 mg Oral Daily Hernandez-Gonzalez, Sue Lush, MD      . hydrocerin (EUCERIN) cream   Topical BID Clapacs, [name], MD      . magnesium hydroxide (MILK OF MAGNESIA) suspension 30 mL  30 mL Oral Daily PRN Clapacs, John T, MD      . mirtazapine (REMERON) tablet 7.5 mg  7.5 mg Oral QHS Jimmy Footman, MD      . nicotine (NICODERM CQ - dosed in mg/24 hours) patch 21 mg  21 mg Transdermal Daily Jimmy Footman, MD        Lab Results:  Results for orders placed or performed during the hospital encounter of 05/07/17 (from the past 48 hour(s))  Comprehensive metabolic panel     Status: None   Collection Time: 05/07/17  7:29 AM  Result Value Ref Range   Sodium 141 135 - 145 mmol/L   Potassium 3.6 3.5 - 5.1 mmol/L   Chloride 110 101 - 111  mmol/L   CO2 24 22 - 32 mmol/L   Glucose, Bld 96 65 - 99 mg/dL   BUN 12 6 - 20 mg/dL   Creatinine, Ser 0.80 0.44 - 1.00 mg/dL   Calcium 9.2 8.9 - 10.3 mg/dL   Total Protein 7.8 6.5 - 8.1 g/dL   Albumin 4.3 3.5 - 5.0 g/dL   AST 24 15 - 41 U/L   ALT 15 14 - 54 U/L   Alkaline Phosphatase 55 38 - 126 U/L   Total Bilirubin 0.8 0.3 - 1.2 mg/dL   GFR calc non Af Amer >60 >60 mL/min   GFR calc Af Amer >60 >60 mL/min    Comment: (NOTE) The eGFR has been calculated using the CKD EPI equation. This calculation has not been validated in all clinical situations. eGFR's persistently <60 mL/min signify possible Chronic Kidney Disease.    Anion gap 7 5 - 15  Ethanol     Status: None   Collection Time: 05/07/17  7:29 AM  Result Value Ref Range   Alcohol, Ethyl (B) <5 <5 mg/dL    Comment:        LOWEST DETECTABLE LIMIT FOR SERUM ALCOHOL IS 5 mg/dL FOR MEDICAL PURPOSES ONLY   cbc     Status: None   Collection Time: 05/07/17  7:29 AM  Result Value Ref Range   WBC 6.1 3.6 - 11.0 K/uL   RBC 4.19 3.80 - 5.20 MIL/uL   Hemoglobin 13.3 12.0 - 16.0 g/dL   HCT 39.0 35.0 - 47.0 %    MCV 93.1 80.0 - 100.0 fL   MCH 31.8 26.0 - 34.0 pg   MCHC 34.1 32.0 - 36.0 g/dL   RDW 13.2 11.5 - 14.5 %   Platelets 213 150 - 440 K/uL    Blood Alcohol level:  Lab Results  Component Value Date   ETH <5 05/07/2017   ETH <5 59/93/5701    Metabolic Disorder Labs: Lab Results  Component Value Date   HGBA1C 5.1 11/06/2016   MPG 100 11/06/2016   Lab Results  Component Value Date   PROLACTIN 21.5 11/06/2016   PROLACTIN 49.4 (H) 08/13/2016   Lab Results  Component Value Date   CHOL 133 11/06/2016   TRIG 80 11/06/2016   HDL 84 11/06/2016   CHOLHDL 1.6 11/06/2016   VLDL 16 11/06/2016   LDLCALC 33 11/06/2016   LDLCALC 32 08/13/2016    Physical Findings: AIMS: Facial and Oral Movements Muscles of Facial Expression: None, normal Lips and Perioral Area: None, normal Jaw: None, normal Tongue: None, normal,Extremity Movements Upper (arms, wrists, hands, fingers): None, normal Lower (legs, knees, ankles, toes): None, normal, Trunk Movements Neck, shoulders, hips: None, normal, Overall Severity Severity of abnormal movements (highest score from questions above): None, normal Incapacitation due to abnormal movements: None, normal Patient's awareness of abnormal movements (rate only patient's report): No Awareness, Dental Status Current problems with teeth and/or dentures?: No Does patient usually wear dentures?: No  CIWA:  CIWA-Ar Total: 0 COWS:  COWS Total Score: 0  Musculoskeletal: Strength & Muscle Tone: within normal limits Gait & Station: normal Patient leans: N/A  Psychiatric Specialty Exam: Physical Exam  Constitutional: She is oriented to person, place, and time. She appears well-developed and well-nourished.  HENT:  Head: Normocephalic and atraumatic.  Eyes: Conjunctivae and EOM are normal.  Neck: Normal range of motion.  Respiratory: Effort normal.  Musculoskeletal: Normal range of motion.  Neurological: She is alert and oriented to person, place, and time.  Review of Systems  Constitutional: Negative.   HENT: Negative.   Eyes: Negative.   Respiratory: Negative.   Cardiovascular: Negative.   Gastrointestinal: Negative.   Genitourinary: Negative.   Musculoskeletal: Negative.   Skin: Negative.   Neurological: Negative.   Endo/Heme/Allergies: Negative.   Psychiatric/Behavioral: Positive for substance abuse. Negative for depression, hallucinations, memory loss and suicidal ideas. The patient is not nervous/anxious and does not have insomnia.     Blood pressure 111/62, pulse 76, temperature 98.4 F (36.9 C), temperature source Oral, resp. rate 18, height '5\' 4"'$  (1.626 m), weight 52.6 kg (116 lb), last menstrual period 05/07/2017, SpO2 100 %.Body mass index is 19.91 kg/m.  General Appearance: Disheveled  Eye Contact:  Minimal  Speech:  Slow  Volume:  Decreased  Mood:  Irritable  Affect:  Congruent  Thought Process:  Linear and Descriptions of Associations: Intact  Orientation:  Full (Time, Place, and Person)  Thought Content:  Hallucinations: None  Suicidal Thoughts:  No  Homicidal Thoughts:  No  Memory:  Immediate;   Fair Recent;   Fair Remote;   Fair  Judgement:  Poor  Insight:  Shallow  Psychomotor Activity:  Decreased  Concentration:  Concentration: Fair and Attention Span: Fair  Recall:  [employer] of Knowledge:  Fair  Language:  Good  Akathisia:  No  Handed:    AIMS (if indicated):     Assets:  Communication Skills Physical Health  ADL's:  Intact  Cognition:  WNL  Sleep:  Number of Hours: 7.15     Treatment Plan Summary:  Bridget Carpenter is a 26 year old female with history of depression, mood instability, and substance abuse admitted for suicidal ideation in the context of treatment noncompliance and relapse on substances.   Unspecified depressive disorder, rule out cocaine-induced depressive disorder in the setting of withdrawal: Continue mirtazapine 7.5 mg by mouth daily at bedtime and Abilify 2 mg by mouth daily  which was the medication sheibed with during her last hospitalization in 7--- refuse medications yesterday by agree with taking medications today   Possible borderline personality disorder: Chronic suicidality, multiple suicidal attempts and self-injurious behaviors  Cocaine use disorder, alcohol use disorder, cannabis use disorder rule out opioid use disorder: Patient has been very resistant to receive substance abuse treatment per documentation in prior admissions. We'll try to connect with outpatient substance abuse services for addiction.  Tobacco use disorder: Will order a nicotine patch 21 mg  Eczema patient has severe eczema that is generalized. She follows up with the [facility] here in [place]. Continue euricin bid--- we will start the patient on steroid twice a day for eczema  Skin infection patient states that her skin is infected however I do not see any evidence of infection. She does have an area that is cracking up and I will order some bacitracin twice a day  Asthma continue albuterol inhaler when necessary  Disposition: Will be discharged back to her mother's house here in [place].   Follow up: long history of noncompliance. We will try to connect with psychiatric services   Labs we'll order Chlamydia and gonorrhea in urine  Hildred Priest, MD 05/08/2017, 1:57 PM

## 2017-05-08 NOTE — [facility] Group Notes (Signed)
[facility] Group Notes:  (Nursing/MHT/Case Management/Adjunct)  Date:  05/08/2017  Time:  4:32 PM  Type of Therapy:  Psychoeducational Skills  Participation Level:  Did Not Attend  Twanna Hymanda C Theresia Pree 05/08/2017, 4:32 PM

## 2017-05-08 NOTE — [facility] Suicide Risk Assessment (Signed)
[facility] Admission Suicide Risk Assessment   Nursing information obtained from:  Patient Demographic factors:  Unemployed Current Mental Status:  NA Loss Factors:  NA Historical Factors:  Prior suicide attempts Risk Reduction Factors:  Responsible for children under 26 years of age  Total Time spent with patient: 1 hour Principal Problem: Major depressive disorder, recurrent severe without psychotic features (HCC) Diagnosis:   Patient Active Problem List   Diagnosis Date Noted  . Eczema [L30.9] 05/08/2017  . Borderline personality disorder [F60.3] 11/06/2016  . Cocaine use disorder, severe, dependence (HCC) [F14.20] 11/06/2016  . Cannabis use disorder, moderate, dependence (HCC) [F12.20] 11/06/2016  . Alcohol use disorder, mild, abuse [F10.10] 11/06/2016  . Major depressive disorder, recurrent severe without psychotic features (HCC) [F33.2] 11/06/2016  . Asthma [J45.909] 09/23/2011  . Tobacco use disorder [F17.200] 09/15/2009   Subjective Data:   Continued Clinical Symptoms:  Alcohol Use Disorder Identification Test Final Score (AUDIT): 2 The "Alcohol Use Disorders Identification Test", Guidelines for Use in Primary Care, Second Edition.  World Science writerHealth Organization [facility](WHO). Score between 0-7:  no or low risk or alcohol related problems. Score between 8-15:  moderate risk of alcohol related problems. Score between 16-19:  high risk of alcohol related problems. Score 20 or above:  warrants further diagnostic evaluation for alcohol dependence and treatment.   CLINICAL FACTORS:   Severe Anxiety and/or Agitation Depression:   Impulsivity Alcohol/Substance Abuse/Dependencies Personality Disorders:   Cluster B Comorbid depression More than one psychiatric diagnosis Previous Psychiatric Diagnoses and Treatments    Psychiatric Specialty Exam: Physical Exam  ROS  Blood pressure 111/62, pulse 76, temperature 98.4 F (36.9 C), temperature source Oral, resp. rate 18, height 5\' 4"  (1.626  m), weight 52.6 kg (116 lb), last menstrual period 05/07/2017, SpO2 100 %.Body mass index is 19.91 kg/m.                                                    Sleep:  Number of Hours: 7.15      COGNITIVE FEATURES THAT CONTRIBUTE TO RISK:  Closed-mindedness    SUICIDE RISK:   Moderate:  Frequent suicidal ideation with limited intensity, and duration, some specificity in terms of plans, no associated intent, good self-control, limited dysphoria/symptomatology, some risk factors present, and identifiable protective factors, including available and accessible social support.  [facility]  I certify that inpatient services furnished can reasonably be expected to improve the patient's condition.   [name],  Bridget Palos, MD 05/08/2017, 11:45 AM

## 2017-05-08 NOTE — Progress Notes (Signed)
Isolates to his room.  Denies SI/HI/AVH.  No group attendance.  Refuses medication. Did not eat breakfast or lunch.  Support offered.  Safety maintained.
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
## 2017-05-08 NOTE — [facility] Group Notes (Signed)
[facility] LCSW Group Therapy  05/08/2017 1:39 PM  Type of Therapy:  Group Therapy  Participation Level:  Patient did not attend group. CSW invited patient to group.   Summary of Progress/Problems: Stress management: Patients defined and discussed the topic of stress and the related symptoms and triggers for stress. Patients identified healthy coping skills they would like to try during hospitalization and after discharge to manage stress in a healthy way. CSW offered insight to varying stress management techniques.   Avonne Berkery G. Garnette CzechSampson MSW, LCSWA 05/08/2017, 1:40 PM

## 2017-05-09 MED ORDER — CLOBETASOL PROPIONATE 0.05 % EX CREA
TOPICAL_CREAM | Freq: Two times a day (BID) | CUTANEOUS | Status: DC
  Filled 2017-05-09: qty 30

## 2017-05-09 MED ORDER — TRIPLE ANTIBIOTIC 3.5-400-5000 EX OINT
TOPICAL_OINTMENT | Freq: Two times a day (BID) | CUTANEOUS | Status: DC
  Administered 2017-05-09: 1 via TOPICAL
  Filled 2017-05-09: qty 1

## 2017-05-09 MED ORDER — TRIAMCINOLONE ACETONIDE 0.1 % EX CREA
TOPICAL_CREAM | Freq: Two times a day (BID) | CUTANEOUS | Status: DC
  Administered 2017-05-09: 1 via TOPICAL
  Administered 2017-05-11: 09:00:00 via TOPICAL
  Filled 2017-05-09: qty 60

## 2017-05-09 NOTE — [facility] Group Notes (Signed)
[facility] Group Notes:  (Nursing/MHT/Case Management/Adjunct)  Date:  05/09/2017  Time:  11:29 PM  Type of Therapy:  Group Therapy  Participation Level:  Did Not Attend  Participation Quality Summary of Progress/Problems:  Susan Bleich L Nakhia Levitan 05/09/2017, 11:29 PM 

## 2017-05-09 NOTE — [facility] Group Notes (Signed)
[facility] LCSW Group Therapy Note  Date/Time: 05/09/17, 1300  Type of Therapy/Topic:  Group Therapy:  Balance in Life  Participation Level:  Pt did not attend group.  Description of Group:    This group will address the concept of balance and how it feels and looks when one is unbalanced. Patients will be encouraged to process areas in their lives that are out of balance, and identify reasons for remaining unbalanced. Facilitators will guide patients utilizing problem- solving interventions to address and correct the stressor making their life unbalanced. Understanding and applying boundaries will be explored and addressed for obtaining  and maintaining a balanced life. Patients will be encouraged to explore ways to assertively make their unbalanced needs known to significant others in their lives, using other group members and facilitator for support and feedback.  Therapeutic Goals: 1. Patient will identify two or more emotions or situations they have that consume much of in their lives. 2. Patient will identify signs/triggers that life has become out of balance:  3. Patient will identify two ways to set boundaries in order to achieve balance in their lives:  4. Patient will demonstrate ability to communicate their needs through discussion and/or role plays  Summary of Patient Progress:          Therapeutic Modalities:   Cognitive Behavioral Therapy Solution-Focused Therapy Assertiveness Training  Greg Tamila Gaulin, LCSW 

## 2017-05-09 NOTE — Progress Notes (Signed)
Denies SI/HI/AVH. Denies pain. Compliant with evening medications. Pt appears to minimize situation. Ate evening snack. Forwards little at this time. Encouragement and support offered. Safety maintained. Will continue to monitor.

## 2017-05-09 NOTE — [facility] Group Notes (Signed)
[facility] Group Notes:  (Nursing/MHT/Case Management/Adjunct)  Date:  05/09/2017  Time:  2:19 AM  Type of Therapy:  Psychoeducational Skills  Participation Level:  Active  Participation Quality:  Appropriate, Sharing and Supportive  Affect:  Appropriate  Cognitive:  Oriented  Insight:  Good  Engagement in Group:  Engaged  Modes of Intervention:  Exploration  Summary of Progress/Problems:  Foy GuadalajaraJasmine R Raygan Skarda 05/09/2017, 2:19 AM

## 2017-05-09 NOTE — Progress Notes (Signed)
Denies SI/HI/AVH. Denies pain. Compliant with evening medications. Pt appears to minimize situation. Reports that she feels like she is "not supposed to be on this earth". Isolates to room frequently. Did not attend group.  Ate evening snack. Forwards little at this time. Encouragement and support offered. Safety maintained. Will continue to monitor.

## 2017-05-09 NOTE — [facility] Suicide Risk Assessment (Signed)
[facility] INPATIENT:  Family/Significant Other Suicide Prevention Education  Suicide Prevention Education:  Education Completed; [place] Carolvelyn Carpenter, mother, [phone],has been identified by the patient as the family member/significant other with whom the patient will be residing, and identified as the person(s) who will aid the patient in the event of a mental health crisis (suicidal ideations/suicide attempt).  With written consent from the patient, the family member/significant other has been provided the following suicide prevention education, prior to the and/or following the discharge of the patient.  The suicide prevention education provided includes the following:  Suicide risk factors  Suicide prevention and interventions  National Suicide Hotline telephone number  [facility] assessment telephone number  [facility] Assistance 911  [facility] and/or Residential Mobile Crisis Unit telephone number  Request made of family/significant other to:  Remove weapons (e.g., guns, rifles, knives), all items previously/currently identified as safety concern.  No guns in the home, per Bridget Carpenter. Remove drugs/medications (over-the-counter, prescriptions, illicit drugs), all items previously/currently identified as a safety concern.Bridget Carpenter has removed some excess medication and will check for more.  The family member/significant other verbalizes understanding of the suicide prevention education information provided.  The family member/significant other agrees to remove the items of safety concern listed above.  Bridget Carpenter has significant concerns regarding her daughter, who has been displaying very strange behaviors for several weeks.  Pt will open and close front door and closet doors over and over.  Pt also leaves and then calls home repeatedly to "check" on them.  27 times one evening, per Bridget Carpenter.  Pt has also lost a lot of weight. Bridget Carpenter believes pt needs long term treatment.  Pt  leaves and goes to a bad neighborhood where there is a lot of drug use.  Bridget Carpenter, Bridget Durango Jon, LCSW 05/09/2017, 3:36 PM

## 2017-05-10 MED ORDER — MIRTAZAPINE 15 MG PO TABS
15.0000 mg | ORAL_TABLET | Freq: Every day | ORAL | Status: DC
  Filled 2017-05-10: qty 1

## 2017-05-10 NOTE — Progress Notes (Signed)
Isolative.  Denies SI/HI/AVH.  No group attendance.  Refused meds.  Support offered.  Safety maintained.

## 2017-05-10 NOTE — Progress Notes (Signed)
Pt observed in room sitting on bed in dark. Refused to participate in assessment. No distress noted. Refused medications. Isolated to room duration of shift. Encouragement and support offered. Safety maintained. Will continue to monitor.

## 2017-05-10 NOTE — [facility] Group Notes (Signed)
[facility] LCSW Group Therapy   05/10/2017 1 PM   Type of Therapy: Group Therapy   Participation Level: Patient invited but did not attend.    Hampton AbbotKadijah Delwin Raczkowski, MSW, LCSW-A 05/10/2017, 3:27PM

## 2017-05-10 NOTE — Plan of Care (Signed)
Problem: Activity: Goal: Interest or engagement in activities will improve Outcome: Not Progressing Pt remained isolated to room duration of shift.

## 2017-05-10 NOTE — Progress Notes (Signed)
[facility] MD Progress Note  05/10/2017 12:29 PM [name] C Hodge  MRN:  [id number] Subjective:  Patient is a 26 year old single African-American female from [facility] [place]. She is well-known to our service as she has been admitted a multitude of times. She carries a diagnosis of depression, cannabis-cocaine dependence and borderline personality disorder.   Patient was brought into our emergency department by [facility] police under involuntary commitment on May 10. Per the ER notes the patient had been using cocaine daily she got into an argument with her mother and locked herself in the bathroom and took a handful of Seroquel. She was planning on overdosing but did not swallow the pills.  Urine toxicology was not completed in the emergency department. Her alcohol level was below the detection limit.  Since admission the patient has been uncooperative, which is her usual presentation. She refuse to fully cooperate with assessment. She tells me she was to be discharged as she is not suicidal and does not have any thoughts about wanting to harm anyone else. She says she did not try to harm herself. She is denying having any auditory or visual hallucinations. Says that she plans to stay in her room throughout the hospital stay. She does not plan to participate in any groups and does not want to eat in the day room. "I will not leave this room even if I starve"  05/09/17 More cooperative today. Still found in her room sleeping late in the morning. She denies depression, suicidality, homicidality or auditory visual hallucinations. She denies problems with mood, sleep, appetite, energy or concentration. Still wanting to be discharged. She was somewhat somatic today. She says that her skin is infected and wanted to press be prescribed clindamycin. She also thinks that she might have an STD but is not sure. She was unable to tell me whether she had a malodorous discharge or not.  Per nursing: Denies  SI/HI/AVH. Denies pain. Compliant with evening medications. Pt appears to minimize situation. Ate evening snack. Forwards little at this time. Encouragement and support offered. Safety maintained. Will continue to monitor.   Isolates to his room.  Denies SI/HI/AVH.  No group attendance.  Refuses medication. Did not eat breakfast or lunch.  Support offered.  Safety maintained.    Principal Problem: Major depressive disorder, recurrent severe without psychotic features (HCC) Diagnosis:   Patient Active Problem List   Diagnosis Date Noted  . Eczema [L30.9] 05/08/2017  . Borderline personality disorder [F60.3] 11/06/2016  . Cocaine use disorder, severe, dependence (HCC) [F14.20] 11/06/2016  . Cannabis use disorder, moderate, dependence (HCC) [F12.20] 11/06/2016  . Alcohol use disorder, mild, abuse [F10.10] 11/06/2016  . Major depressive disorder, recurrent severe without psychotic features (HCC) [F33.2] 11/06/2016  . Asthma [J45.909] 09/23/2011  . Tobacco use disorder [F17.200] 09/15/2009   Total Time spent with patient: 30 minutes  Past Psychiatric History: Multiple hospitalization due to suicidality in the setting of intoxication with cocaine along with some psychotic symptoms. Says she has been hospitalized about 20 times. She stated that she has tried in the past to jump in front of cars, jumping out of cars. She also states she has overdosed on medications in the past with the attempt to kill herself. The patient reports history of self injury by cutting.  As far as diagnosis says that she has been diagnosed in the past with schizophrenia, and bipolar. Per the chart that she has been given diagnosis of ADHD and oppositional defiant disorder as a child along  with issues related to substance abuse.  Not currently on any medications and not seeing a psychiatrist  Per chart she has a long history of noncompliance with outpatient treatment or medications prescribed in the past.  Past  Medical History:  Past Medical History:  Diagnosis Date  . Asthma   . Depression 2009   Inpatient psych admission for SI, dissociative fugue  . Dissociative disorder or reaction 2009  . Eczema   . H/O: suicide attempt   . ODD (oppositional defiant disorder)   . PTSD (post-traumatic stress disorder)   . Schizoaffective disorder (HCC)   . Substance abuse     Past Surgical History:  Procedure Laterality Date  . ADENOIDECTOMY    . TONSILLECTOMY     Family History:  Family History  Problem Relation Age of Onset  . Adopted: Yes   Family Psychiatric  History: Her biological mother has a history of a unknown type of mental illness.   Social History: She lives with her adoptive mother and her 12-year-old daughter. She has Medicaid.Patient states she is currently unemployed. During her past admission in November she was on parole says she is not longer on parole History  Alcohol Use  . Yes    Comment: sometimes     History  Drug Use No    Social History   Social History  . Marital status: Single    Spouse name: N/A  . Number of children: N/A  . Years of education: N/A   Social History Main Topics  . Smoking status: Current Every Day Smoker    Packs/day: 0.50    Years: 2.00    Types: Cigarettes  . Smokeless tobacco: Never Used  . Alcohol use Yes     Comment: sometimes  . Drug use: No  . Sexual activity: Yes    Birth control/ protection: Condom   Other Topics Concern  . None   Social History Narrative  . None     Current Medications: Current Facility-Administered Medications  Medication Dose Route Frequency Provider Last Rate Last Dose  . acetaminophen (TYLENOL) tablet 650 mg  650 mg Oral Q6H PRN Clapacs, John T, MD      . albuterol (PROVENTIL HFA;VENTOLIN HFA) 108 (90 Base) MCG/ACT inhaler 1-2 puff  1-2 puff Inhalation Q4H PRN Jimmy Footman, MD      . alum & mag hydroxide-simeth (MAALOX/MYLANTA) 200-200-20 MG/5ML suspension 30 mL  30 mL Oral Q4H  PRN Clapacs, John T, MD      . ARIPiprazole (ABILIFY) tablet 2 mg  2 mg Oral Daily Jimmy Footman, MD   2 mg at 05/09/17 0951  . hydrocerin (EUCERIN) cream   Topical BID Clapacs, [name], MD   1 application at 05/09/17 2130  . magnesium hydroxide (MILK OF MAGNESIA) suspension 30 mL  30 mL Oral Daily PRN Clapacs, John T, MD      . mirtazapine (REMERON) tablet 15 mg  15 mg Oral QHS Jimmy Footman, MD      . nicotine (NICODERM CQ - dosed in mg/24 hours) patch 21 mg  21 mg Transdermal Daily Jimmy Footman, MD      . triamcinolone cream (KENALOG) 0.1 %   Topical BID Jimmy Footman, MD   1 application at 05/09/17 2129  . TRIPLE ANTIBIOTIC [phone] OINT   Topical BID Jimmy Footman, MD   1 application at 05/09/17 2128    Lab Results:  No results found for this or any previous visit (from the past 48 hour(s)).  Blood  Alcohol level:  Lab Results  Component Value Date   ETH <5 05/07/2017   ETH <5 12/15/2016    Metabolic Disorder Labs: Lab Results  Component Value Date   HGBA1C 5.1 11/06/2016   MPG 100 11/06/2016   Lab Results  Component Value Date   PROLACTIN 21.5 11/06/2016   PROLACTIN 49.4 (H) 08/13/2016   Lab Results  Component Value Date   CHOL 133 11/06/2016   TRIG 80 11/06/2016   HDL 84 11/06/2016   CHOLHDL 1.6 11/06/2016   VLDL 16 11/06/2016   LDLCALC 33 11/06/2016   LDLCALC 32 08/13/2016    Physical Findings: AIMS: Facial and Oral Movements Muscles of Facial Expression: None, normal Lips and Perioral Area: None, normal Jaw: None, normal Tongue: None, normal,Extremity Movements Upper (arms, wrists, hands, fingers): None, normal Lower (legs, knees, ankles, toes): None, normal, Trunk Movements Neck, shoulders, hips: None, normal, Overall Severity Severity of abnormal movements (highest score from questions above): None, normal Incapacitation due to abnormal movements: None, normal Patient's awareness of  abnormal movements (rate only patient's report): No Awareness, Dental Status Current problems with teeth and/or dentures?: No Does patient usually wear dentures?: No  CIWA:  CIWA-Ar Total: 0 COWS:  COWS Total Score: 0  Musculoskeletal: Strength & Muscle Tone: within normal limits Gait & Station: normal Patient leans: N/A  Psychiatric Specialty Exam: Physical Exam  Constitutional: She is oriented to person, place, and time. She appears well-developed and well-nourished.  HENT:  Head: Normocephalic and atraumatic.  Eyes: Conjunctivae and EOM are normal.  Neck: Normal range of motion.  Respiratory: Effort normal.  Musculoskeletal: Normal range of motion.  Neurological: She is alert and oriented to person, place, and time.    Review of Systems  Constitutional: Negative.   HENT: Negative.   Eyes: Negative.   Respiratory: Negative.   Cardiovascular: Negative.   Gastrointestinal: Negative.   Genitourinary: Negative.   Musculoskeletal: Negative.   Skin: Negative.   Neurological: Negative.   Endo/Heme/Allergies: Negative.   Psychiatric/Behavioral: Positive for substance abuse. Negative for depression, hallucinations, memory loss and suicidal ideas. The patient is not nervous/anxious and does not have insomnia.     Blood pressure 104/69, pulse 74, temperature 97.8 F (36.6 C), temperature source Oral, resp. rate 18, height 5\' 4"  (1.626 m), weight 52.6 kg (116 lb), last menstrual period 05/07/2017, SpO2 100 %.Body mass index is 19.91 kg/m.  General Appearance: Disheveled  Eye Contact:  Minimal  Speech:  Slow  Volume:  Decreased  Mood:  Irritable  Affect:  Congruent  Thought Process:  Linear and Descriptions of Associations: Intact  Orientation:  Full (Time, Place, and Person)  Thought Content:  Hallucinations: None  Suicidal Thoughts:  No  Homicidal Thoughts:  No  Memory:  Immediate;   Fair Recent;   Fair Remote;   Fair  Judgement:  Poor  Insight:  Shallow  Psychomotor  Activity:  Decreased  Concentration:  Concentration: Fair and Attention Span: Fair  Recall:  [employer]Fair  Fund of Knowledge:  Fair  Language:  Good  Akathisia:  No  Handed:    AIMS (if indicated):     Assets:  Communication Skills Physical Health  ADL's:  Intact  Cognition:  WNL  Sleep:  Number of Hours: 7     Treatment Plan Summary:  [name] is a 26 year old female with history of depression, mood instability, and substance abuse admitted for suicidal ideation in the context of treatment noncompliance and relapse on substances.   Unspecified depressive disorder, rule out  cocaine-induced depressive disorder in the setting of withdrawal: Continue mirtazapine But per her request we'll increase to 15 mg by mouth qhs   and Abilify 2 mg by mouth daily which was the medication sheibed with during her last hospitalization in 7--- refuse medications yesterday by agree with taking medications today   Possible borderline personality disorder: Chronic suicidality, multiple suicidal attempts and self-injurious behaviors  Cocaine use disorder, alcohol use disorder, cannabis use disorder rule out opioid use disorder: Patient has been very resistant to receive substance abuse treatment per documentation in prior admissions. We'll try to connect with outpatient substance abuse services for addiction.  Tobacco use disorder: Will order a nicotine patch 21 mg  Eczema patient has severe eczema that is generalized. She follows up with the [facility] here in [place]. Continue euricin bid--- we will start the patient on steroid twice a day for eczema  Skin infection patient states that her skin is infected however I do not see any evidence of infection. She does have an area that is cracking up and I will order some bacitracin twice a day  Asthma continue albuterol inhaler when necessary  Disposition: Will be discharged back to her mother's house here in [place].   Follow up:  long history of noncompliance. We will try to connect with psychiatric services   Labs we'll order Chlamydia and gonorrhea in urine---Patient never provided a sample  Jimmy Footman, MD 05/10/2017, 12:29 PM

## 2017-05-11 MED ORDER — ARIPIPRAZOLE 2 MG PO TABS: 2.0000 mg | ORAL_TABLET | Freq: Every day | ORAL | 0 refills | Status: DC

## 2017-05-11 MED ORDER — ALBUTEROL SULFATE HFA 108 (90 BASE) MCG/ACT IN AERS: 1.0000 | INHALATION_SPRAY | RESPIRATORY_TRACT | 0 refills | Status: DC | PRN

## 2017-05-11 MED ORDER — TRIAMCINOLONE ACETONIDE 0.1 % EX CREA: TOPICAL_CREAM | Freq: Two times a day (BID) | CUTANEOUS | 0 refills | Status: DC

## 2017-05-11 MED ORDER — MIRTAZAPINE 15 MG PO TABS: 15.0000 mg | ORAL_TABLET | Freq: Every day | ORAL | 0 refills | Status: DC

## 2017-05-11 NOTE — Progress Notes (Signed)
CSW received phone call from pt's mother, Bridget Carpenter, who reports that pt told her she may be coming home today.  Bridget Carpenter is very concerned about this and feels that she needs "more help."  CSW asked if she had seen something concerning over the weekend?  Bridget Carpenter said she had not come to visit yesterday and that she needs more time for herself to have a break from the pt.  She said in the past pt has been kept for 2 weeks.  CSW told her that it is rare for someone to stay that long.  CSW will speak with MD about the plan and contact Bridget Carpenter when CSW knows MD intentions for discharge. [name], MSW, LCSW Clinical Social Worker 05/11/2017 8:25 AM

## 2017-05-11 NOTE — [facility] Group Notes (Signed)
[facility] LCSW Group Therapy Note  Date/Time:05/11/17, 1300  Type of Therapy and Topic:  Group Therapy:  Overcoming Obstacles  Participation Level:  active  Description of Group:    In this group patients will be encouraged to explore what they see as obstacles to their own wellness and recovery. They will be guided to discuss their thoughts, feelings, and behaviors related to these obstacles. The group will process together ways to cope with barriers, with attention given to specific choices patients can make. Each patient will be challenged to identify changes they are motivated to make in order to overcome their obstacles. This group will be process-oriented, with patients participating in exploration of their own experiences as well as giving and receiving support and challenge from other group members.  Therapeutic Goals: 1. Patient will identify personal and current obstacles as they relate to admission. 2. Patient will identify barriers that currently interfere with their wellness or overcoming obstacles.  3. Patient will identify feelings, thought process and behaviors related to these barriers. 4. Patient will identify two changes they are willing to make to overcome these obstacles:    Summary of Patient Progress: pt was unable to focus much on the group topic and wanted to talk about her discharge plan. Pt was redirected by CSW a few times.  She did identify paranoia and substance abuse as obstacles.        Therapeutic Modalities:   Cognitive Behavioral Therapy Solution Focused Therapy Motivational Interviewing Relapse Prevention Therapy  Daleen SquibbGreg Marki Frede, LCSW
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
## 2017-05-11 NOTE — [facility] Group Notes (Signed)
[facility] Group Notes:  (Nursing/MHT/Case Management/Adjunct)  Date:  05/11/2017  Time:  4:13 PM  Type of Therapy:  Psychoeducational Skills  Participation Level:  Did Not Attend  Bridget Carpenter 05/11/2017, 4:13 PM

## 2017-05-11 NOTE — Progress Notes (Signed)
Denies SI/HI/AVH.   Discharge instructions given, verbalized understanding.  Prescriptions given and personal belongings returned.  Escorted off unit by this writer to the main entrance to travel home with mother.

## 2017-05-11 NOTE — [facility] Suicide Risk Assessment (Signed)
[facility]   Principal Problem: Major depressive disorder, recurrent severe without psychotic features [facility](HCC) Discharge Diagnoses:  Patient Active Problem List   Diagnosis Date Noted  . Eczema [L30.9] 05/08/2017  . Borderline personality disorder [F60.3] 11/06/2016  . Cocaine use disorder, severe, dependence ([facility]) [F14.20] 11/06/2016  . Cannabis use disorder, moderate, dependence ([facility]) [F12.20] 11/06/2016  . Alcohol use disorder, mild, abuse [F10.10] 11/06/2016  . Major depressive disorder, recurrent severe without psychotic features ([facility]) [F33.2] 11/06/2016  . Asthma [J45.909] 09/23/2011  . Tobacco use disorder [F17.200] 09/15/2009      Psychiatric Specialty Exam: ROS  Blood pressure 98/66, pulse 65, temperature 98.2 F (36.8 C), temperature source Oral, resp. rate 18, height 5\' 4"  (1.626 m), weight 52.6 kg (116 lb), last menstrual period 05/07/2017, SpO2 100 %.Body mass index is 19.91 kg/m.                                                       Mental Status Per Nursing Assessment::   On Admission:  NA  Demographic Factors:  Adolescent or young adult, Low socioeconomic status and Unemployed  Loss Factors: Legal issues  Historical Factors: Prior suicide attempts and Impulsivity  Risk Reduction Factors:   Responsible for children under 26 years of age, Living with another person, especially a relative and Positive social support  No access to guns  Continued Clinical Symptoms:  Alcohol/Substance Abuse/Dependencies Previous Psychiatric Diagnoses and Treatments  Cognitive Features That Contribute To Risk:  Closed-mindedness    Suicide Risk:  Minimal: No identifiable suicidal ideation.  Patients presenting with no risk factors but with morbid ruminations; may be classified as minimal risk based on the severity of the depressive symptoms      [name],  Shatara Stanek, MD 05/11/2017, 9:35 AM

## 2017-05-11 NOTE — Progress Notes (Signed)
  [facility] :  Will you be returning to the same living situation after discharge:  Yes,  with mother At discharge, do you have transportation home?: Yes,  mother Do you have the ability to pay for your medications: Yes,  medicaid  Release of information consent forms completed and in the chart;  Patient's signature needed at discharge.  Patient to Follow up at: Follow-up Information    [facility] Go on 05/12/2017.   Why:  Please attend your hospital discharge appointment Tuesday, 05/12/17, at 2:30pm.  Please request community support team services.  Please bring a copy of your hospital discharge paperwork. Contact information: [address] [place] [address] [phone]        [facility] Follow up.   Why:  You have been referred to [facility] for possible ACT team or [facility] services.  Please contact them to discuss intake date. Contact information: [address] [place], [place] [address] P: [phone] F: [phone]          Next level of care provider has [facility] to [facility] Link:no  Safety Planning and Suicide Prevention discussed: Yes,  with mother  Have you used any form of tobacco in the last 30 days? (Cigarettes, Smokeless Tobacco, Cigars, and/or Pipes): Yes  Has patient been referred to the Quitline?: Yes, faxed on 05/11/17  Patient has been referred for addiction treatment: Yes  [name], Cambry Spampinato Jon, LCSW 05/11/2017, 3:31 PM

## 2017-05-11 NOTE — Tx Team (Signed)
Interdisciplinary Treatment and Diagnostic Plan Update  05/11/2017 Time of Session: 1130 [name] MRN: [id number]  Principal Diagnosis: Major depressive disorder, recurrent severe without psychotic features (HCC)  Secondary Diagnoses: Principal Problem:   Major depressive disorder, recurrent severe without psychotic features (HCC) Active Problems:   Tobacco use disorder   Asthma   Borderline personality disorder   Cocaine use disorder, severe, dependence (HCC)   Cannabis use disorder, moderate, dependence (HCC)   Alcohol use disorder, mild, abuse   Eczema   Current Medications:  Current Facility-Administered Medications  Medication Dose Route Frequency Provider Last Rate Last Dose  . acetaminophen (TYLENOL) tablet 650 mg  650 mg Oral Q6H PRN Clapacs, John T, MD      . albuterol (PROVENTIL HFA;VENTOLIN HFA) 108 (90 Base) MCG/ACT inhaler 1-2 puff  1-2 puff Inhalation Q4H PRN Jimmy Footman, MD      . alum & mag hydroxide-simeth (MAALOX/MYLANTA) 200-200-20 MG/5ML suspension 30 mL  30 mL Oral Q4H PRN Clapacs, John T, MD      . hydrocerin (EUCERIN) cream   Topical BID Clapacs, John T, MD      . magnesium hydroxide (MILK OF MAGNESIA) suspension 30 mL  30 mL Oral Daily PRN Clapacs, John T, MD      . mirtazapine (REMERON) tablet 15 mg  15 mg Oral QHS Hernandez-Gonzalez, Sue Lush, MD      . nicotine (NICODERM CQ - dosed in mg/24 hours) patch 21 mg  21 mg Transdermal Daily Hernandez-Gonzalez, Sue Lush, MD      . triamcinolone cream (KENALOG) 0.1 %   Topical BID Jimmy Footman, MD      . TRIPLE ANTIBIOTIC [phone] OINT   Topical BID Jimmy Footman, MD   Stopped at 05/10/17 1700   PTA Medications: Prescriptions Prior to Admission  Medication Sig Dispense Refill Last Dose  . butalbital-acetaminophen-caffeine (FIORICET, ESGIC) 50-325-40 MG tablet Take 1 tablet by mouth 2 (two) times daily as needed for headache.   unknown at unknown  . gabapentin  (NEURONTIN) 100 MG capsule Take 100 mg by mouth 3 (three) times daily.   unknown at unknown  . hydrOXYzine (ATARAX/VISTARIL) 25 MG tablet Take 1 tablet (25 mg total) by mouth 3 (three) times daily as needed. 30 tablet 0 unknown at unknown  . permethrin (ELIMITE) 5 % cream Apply to skin from neck to soles of feet and leave on for 10-12 hours, then take a shower and wash it off. Repeat in 1 week. 60 g 1 unknown at unknown    Patient Stressors: Marital or family conflict Substance abuse  Patient Strengths: Capable of independent living General fund of knowledge  Treatment Modalities: Medication Management, Group therapy, Case management,  1 to 1 session with clinician, Psychoeducation, Recreational therapy.   Physician Treatment Plan for Primary Diagnosis: Major depressive disorder, recurrent severe without psychotic features (HCC) Long Term Goal(s): Improvement in symptoms so as ready for discharge Improvement in symptoms so as ready for discharge   Short Term Goals: Ability to verbalize feelings will improve Ability to demonstrate self-control will improve Ability to identify and develop effective coping behaviors will improve Compliance with prescribed medications will improve Ability to identify triggers associated with substance abuse/mental health issues will improve  Medication Management: Evaluate patient's response, side effects, and tolerance of medication regimen.  Therapeutic Interventions: 1 to 1 sessions, Unit Group sessions and Medication administration.  Evaluation of Outcomes: Adequate for Discharge  Physician Treatment Plan for Secondary Diagnosis: Principal Problem:   Major depressive disorder, recurrent severe  without psychotic features (HCC) Active Problems:   Tobacco use disorder   Asthma   Borderline personality disorder   Cocaine use disorder, severe, dependence (HCC)   Cannabis use disorder, moderate, dependence (HCC)   Alcohol use disorder, mild, abuse    Eczema  Long Term Goal(s): Improvement in symptoms so as ready for discharge Improvement in symptoms so as ready for discharge   Short Term Goals: Ability to verbalize feelings will improve Ability to demonstrate self-control will improve Ability to identify and develop effective coping behaviors will improve Compliance with prescribed medications will improve Ability to identify triggers associated with substance abuse/mental health issues will improve     Medication Management: Evaluate patient's response, side effects, and tolerance of medication regimen.  Therapeutic Interventions: 1 to 1 sessions, Unit Group sessions and Medication administration.  Evaluation of Outcomes: Adequate for Discharge   RN Treatment Plan for Primary Diagnosis: Major depressive disorder, recurrent severe without psychotic features (HCC) Long Term Goal(s): Knowledge of disease and therapeutic regimen to maintain health will improve  Short Term Goals: Ability to verbalize feelings will improve, Ability to disclose and discuss suicidal ideas, Ability to identify and develop effective coping behaviors will improve and Compliance with prescribed medications will improve  Medication Management: RN will administer medications as ordered by provider, will assess and evaluate patient's response and provide education to patient for prescribed medication. RN will report any adverse and/or side effects to prescribing provider.  Therapeutic Interventions: 1 on 1 counseling sessions, Psychoeducation, Medication administration, Evaluate responses to treatment, Monitor vital signs and CBGs as ordered, Perform/monitor CIWA, COWS, AIMS and Fall Risk screenings as ordered, Perform wound care treatments as ordered.  Evaluation of Outcomes: Adequate for Discharge   LCSW Treatment Plan for Primary Diagnosis: Major depressive disorder, recurrent severe without psychotic features (HCC) Long Term Goal(s): Safe transition to  appropriate next level of care at discharge, Engage patient in therapeutic group addressing interpersonal concerns.  Short Term Goals: Engage patient in aftercare planning with referrals and resources and Increase skills for wellness and recovery  Therapeutic Interventions: Assess for all discharge needs, 1 to 1 time with Social worker, Explore available resources and support systems, Assess for adequacy in community support network, Educate family and significant other(s) on suicide prevention, Complete Psychosocial Assessment, Interpersonal group therapy.  Evaluation of Outcomes: Adequate for Discharge   Progress in Treatment: Attending groups: Yes. Participating in groups: Yes. Taking medication as prescribed: Yes. Toleration medication: Yes. Family/Significant other contact made: Yes, individual(s) contacted:  mother Patient understands diagnosis: Yes. Discussing patient identified problems/goals with staff: Yes. Medical problems stabilized or resolved: Yes. Denies suicidal/homicidal ideation: Yes. Issues/concerns per patient self-inventory: No. Other: none  New problem(s) identified: No, Describe:  none  New Short Term/Long Term Goal(s):  Discharge Plan or Barriers: Community support team: [facility] or [facility].  Reason for Continuation of Hospitalization: Other; describe none, dischage today  Estimated Length of Stay:discharge today    Attendees: Patient: [name] 05/11/2017   Physician: Dr. Ardyth HarpsHernandez, MD 05/11/2017   Nursing: Leonia ReaderPhyllis Cobb, RN 05/11/2017   RN Care Manager:Corrie Aurther Lofterry, RN 05/11/2017   Social Worker: Daleen SquibbGreg Takyra Cantrall, LCSW 05/11/2017   Recreational Therapist:  05/11/2017   Other:  05/11/2017  Other:  05/11/2017   Other: 05/11/2017     Scribe for Treatment Team: [name], Marciel Offenberger Jon, LCSW 05/11/2017 4:17 PM

## 2017-06-27 ENCOUNTER — Encounter: Payer: Self-pay | Admitting: Emergency Medicine

## 2017-06-27 ENCOUNTER — Emergency Department
Admission: EM | Admit: 2017-06-27 | Discharge: 2017-06-28 | Disposition: A | Discharge status: Other Acute Inpatient Hospital | Payer: Medicaid Other | Attending: Emergency Medicine | Admitting: Emergency Medicine

## 2017-06-27 DIAGNOSIS — F259 Schizoaffective disorder, unspecified: Secondary | ICD-10-CM | POA: Insufficient documentation

## 2017-06-27 DIAGNOSIS — J45909 Unspecified asthma, uncomplicated: Secondary | ICD-10-CM | POA: Diagnosis not present

## 2017-06-27 DIAGNOSIS — Z046 Encounter for general psychiatric examination, requested by authority: Secondary | ICD-10-CM | POA: Diagnosis not present

## 2017-06-27 DIAGNOSIS — R443 Hallucinations, unspecified: Secondary | ICD-10-CM | POA: Diagnosis present

## 2017-06-27 DIAGNOSIS — F4310 Post-traumatic stress disorder, unspecified: Secondary | ICD-10-CM | POA: Diagnosis not present

## 2017-06-27 DIAGNOSIS — F449 Dissociative and conversion disorder, unspecified: Secondary | ICD-10-CM | POA: Insufficient documentation

## 2017-06-27 DIAGNOSIS — F17210 Nicotine dependence, cigarettes, uncomplicated: Secondary | ICD-10-CM | POA: Diagnosis not present

## 2017-06-27 DIAGNOSIS — F29 Unspecified psychosis not due to a substance or known physiological condition: Secondary | ICD-10-CM | POA: Diagnosis not present

## 2017-06-27 LAB — URINALYSIS, COMPLETE (UACMP) WITH MICROSCOPIC
Bacteria, UA: NONE SEEN
Bilirubin Urine: NEGATIVE
Glucose, UA: NEGATIVE mg/dL
HGB URINE DIPSTICK: NEGATIVE
Ketones, ur: NEGATIVE mg/dL
LEUKOCYTES UA: NEGATIVE
NITRITE: NEGATIVE
PH: 5 (ref 5.0–8.0)
Protein, ur: NEGATIVE mg/dL
RBC / HPF: NONE SEEN RBC/hpf (ref 0–5)
SPECIFIC GRAVITY, URINE: 1.031 — AB (ref 1.005–1.030)
SQUAMOUS EPITHELIAL / LPF: NONE SEEN
WBC, UA: NONE SEEN WBC/hpf (ref 0–5)

## 2017-06-27 LAB — COMPREHENSIVE METABOLIC PANEL
ALT: 15 U/L (ref 14–54)
ANION GAP: 7 (ref 5–15)
AST: 31 U/L (ref 15–41)
Albumin: 4.5 g/dL (ref 3.5–5.0)
Alkaline Phosphatase: 55 U/L (ref 38–126)
BILIRUBIN TOTAL: 0.7 mg/dL (ref 0.3–1.2)
BUN: 18 mg/dL (ref 6–20)
CHLORIDE: 106 mmol/L (ref 101–111)
CO2: 26 mmol/L (ref 22–32)
Calcium: 9.1 mg/dL (ref 8.9–10.3)
Creatinine, Ser: 0.99 mg/dL (ref 0.44–1.00)
GFR calc Af Amer: >60 mL/min (ref >60)
Glucose, Bld: 75 mg/dL (ref 65–99)
POTASSIUM: 3.5 mmol/L (ref 3.5–5.1)
Sodium: 139 mmol/L (ref 135–145)
TOTAL PROTEIN: 8 g/dL (ref 6.5–8.1)

## 2017-06-27 LAB — URINE DRUG SCREEN, QUALITATIVE (ARMC ONLY)
AMPHETAMINES, UR SCREEN: POSITIVE — AB
BENZODIAZEPINE, UR SCRN: NOT DETECTED
Barbiturates, Ur Screen: POSITIVE — AB
Cannabinoid 50 Ng, Ur ~~LOC~~: POSITIVE — AB
Cocaine Metabolite,Ur ~~LOC~~: POSITIVE — AB
MDMA (Ecstasy)Ur Screen: NOT DETECTED
METHADONE SCREEN, URINE: NOT DETECTED
Opiate, Ur Screen: NOT DETECTED
PHENCYCLIDINE (PCP) UR S: NOT DETECTED
TRICYCLIC, UR SCREEN: NOT DETECTED

## 2017-06-27 LAB — SALICYLATE LEVEL

## 2017-06-27 LAB — CBC
HCT: 39.8 % (ref 35.0–47.0)
Hemoglobin: 13.4 g/dL (ref 12.0–16.0)
MCH: 31.4 pg (ref 26.0–34.0)
MCHC: 33.7 g/dL (ref 32.0–36.0)
MCV: 93.2 fL (ref 80.0–100.0)
PLATELETS: 245 10*3/uL (ref 150–440)
RBC: 4.27 MIL/uL (ref 3.80–5.20)
RDW: 13.5 % (ref 11.5–14.5)
WBC: 8 10*3/uL (ref 3.6–11.0)

## 2017-06-27 LAB — ETHANOL

## 2017-06-27 LAB — POCT PREGNANCY, URINE: PREG TEST UR: NEGATIVE

## 2017-06-27 LAB — ACETAMINOPHEN LEVEL

## 2017-06-27 MED ORDER — ARIPIPRAZOLE 15 MG PO TABS
15.0000 mg | ORAL_TABLET | Freq: Every day | ORAL | Status: DC
  Filled 2017-06-27 (×2): qty 1

## 2017-06-27 NOTE — ED Triage Notes (Addendum)
Pt arrived with BPD with IVC paper work. Has hx of schizophrenia and bipolar disorder.  Lives at home with mother who took out the papers. Pt has been refusing to take medications and hallucinating.  Pt in triage states she is hearing voices and states she swallowed a bunch of spiders.  Per paperwork, pt has been throwing things at mother and breaking things in the house.

## 2017-06-27 NOTE — ED Notes (Signed)
Pt eating supper and was given milk. Pt apologized for being rude earlier and ask what this tech thought of her. I informed pt that I didn't know her, but that I thought she was a fine person and pt wanted to know what I would say to her and this tech told pt that my advice to her would be to be calm and patient while waiting on doctor to come to see her. I told pt to just relax as much as possible and as soon as I knew more I would let her know. Pt was ok with this information.

## 2017-06-27 NOTE — ED Notes (Signed)
This tech to pt bedside to obtain VS and pt refused telling me to "get the hell out"

## 2017-06-27 NOTE — ED Notes (Signed)
Pt given supper tray. RN is with pt at this time.

## 2017-06-27 NOTE — BH Assessment (Signed)
Assessment Note  Bridget Carpenter is an 26 y.o. female. The patient came in after being IVC'd  Due to having an argument with her mother and "breaking things".  The patient reported "everything was a misunderstanding". She initially stated she was wanted to leave the home due to a mouse in the house and was going to call an exterminator.  She then stated she accidentally broke her mother's TV and was going to buy her mother a new TV.  There were times when it was difficult to follow the patient's thought process.  She reported she thinks people are out to get her and hears people call her "crack head and drug addict".  She also stated there are people who are in the community who talk about her daughter and make accusations about the patient's health, such as having HIV.  The patient asked several times to have an HIV test done.  She also asked if her mother and daughter were alive and well.  She reported she is not currently taking any medication.  She is supposed to go to [facility] according to the client, but she doesn't go there.  She would not state the last time she took her medication.  The patient's mother was called but she was unable to be reached to obtain other information.  She did not give much information about her drug use. She did state that she uses crystal meth the most but would not give information on when she last used or how much she uses.  She also stated that she has used cannabis and cocaine in the past, but would not give details about her use.  She tested positive for amphetamines, cocaine, cannabis, and barbituates.    She denies SI and HI  Diagnosis:   Past Medical History:  Past Medical History:  Diagnosis Date  . Asthma   . Depression 2009   Inpatient psych admission for SI, dissociative fugue  . Dissociative disorder or reaction 2009  . Eczema   . H/O: suicide attempt   . ODD (oppositional defiant disorder)   . PTSD (post-traumatic stress disorder)   .  Schizoaffective disorder (HCC)   . Substance abuse     Past Surgical History:  Procedure Laterality Date  . ADENOIDECTOMY    . TONSILLECTOMY      Family History:  Family History  Problem Relation Age of Onset  . Adopted: Yes    Social History:  reports that she has been smoking Cigarettes.  She has a 1.00 pack-year smoking history. She has never used smokeless tobacco. She reports that she drinks alcohol. She reports that she does not use drugs.  Additional Social History:  Alcohol / Drug Use Pain Medications: See PTA Prescriptions: See PTA Over the Counter: See PTA History of alcohol / drug use?: Yes Longest period of sobriety (when/how long): Unknown Substance #1 Name of Substance 1: cannabis Substance #2 Name of Substance 2: cocaine Substance #3 Name of Substance 3: crystal meth Substance #4 Name of Substance 4: barbituates  CIWA: CIWA-Ar BP: 104/88 Pulse Rate: 65 COWS:    Allergies:  Allergies  Allergen Reactions  . Banana Hives    Home Medications:  (Not in a hospital admission)  OB/GYN Status:  No LMP recorded (lmp unknown).  General Assessment Data Location of Assessment: [facility] ED TTS Assessment: In system Is this a Tele or Face-to-Face Assessment?: Face-to-Face Is this an Initial Assessment or a Re-assessment for this encounter?: Initial Assessment Marital status: Single Maiden name:  NA Is patient pregnant?: No Pregnancy Status: No Living Arrangements: Children, Parent Can pt return to current living arrangement?: Yes Admission Status: Involuntary Is patient capable of signing voluntary admission?: Yes Referral Source: Self/Family/Friend Insurance type: None  Medical Screening Exam [facility] ONLY) Medical Exam completed: Yes  Crisis Care Plan Living Arrangements: Children, Parent Legal Guardian: Other: (none) Name of Psychiatrist: none  Name of Therapist: none  Education Status Is patient currently in school?: No Current Grade:  NA Highest grade of school patient has completed: unknown Name of school: NA Contact person: NA  Risk to self with the past 6 months Suicidal Ideation: No Has patient been a risk to self within the past 6 months prior to admission? : No Suicidal Intent: No Has patient had any suicidal intent within the past 6 months prior to admission? : No Is patient at risk for suicide?: No Suicidal Plan?: No Has patient had any suicidal plan within the past 6 months prior to admission? : No Access to Means: No What has been your use of drugs/alcohol within the last 12 months?: The patient does not give details about her drug use but tested positive for amphetamines, cocaine, cannabis and barnituates. Previous Attempts/Gestures: No How many times?: 0 Other Self Harm Risks: none Triggers for Past Attempts: None known Intentional Self Injurious Behavior: None Family Suicide History: Unknown Recent stressful life event(s):  (conflict with mother and psychosis) Depression: No Depression Symptoms: Guilt Substance abuse history and/or treatment for substance abuse?: Yes Suicide prevention information given to non-admitted patients: Yes  Risk to Others within the past 6 months Homicidal Ideation: No Does patient have any lifetime risk of violence toward others beyond the six months prior to admission? : No Thoughts of Harm to Others: No Current Homicidal Intent: No Current Homicidal Plan: No Access to Homicidal Means: No Identified Victim: NA History of harm to others?: No Assessment of Violence: On admission Violent Behavior Description: broke TV at home Does patient have access to weapons?: No Criminal Charges Pending?: No Does patient have a court date: No Is patient on probation?: No  Psychosis Hallucinations: Auditory Delusions: Persecutory  Mental Status Report Appearance/Hygiene: Unremarkable, In scrubs Eye Contact: Good Motor Activity: Unremarkable Speech: Tangential Level of  Consciousness: Alert Mood: Anxious, Apprehensive Affect: Anxious Anxiety Level: Moderate Thought Processes: Tangential Judgement: Impaired Orientation: Appropriate for developmental age Obsessive Compulsive Thoughts/Behaviors: Minimal  Cognitive Functioning Concentration: Fair Memory: Recent Intact, Remote Intact IQ: Average Insight: Fair Impulse Control: Fair Appetite: Good Weight Loss: 0 Weight Gain: 0 Sleep: No Change Total Hours of Sleep: 8 Vegetative Symptoms: None  ADLScreening [facility] Assessment Services) Patient's cognitive ability adequate to safely complete daily activities?: Yes Patient able to express need for assistance with ADLs?: Yes Independently performs ADLs?: Yes (appropriate for developmental age)  Prior Inpatient Therapy Prior Inpatient Therapy: Yes Prior Therapy Dates: 04/2017 [facility] Reason for Treatment: psychosis  Prior Outpatient Therapy Prior Outpatient Therapy: Yes Prior Therapy Dates: unknown Prior Therapy Facilty/Provider(s): [facility] Reason for Treatment: schizoaffective disorder Does patient have an ACCT team?: No Does patient have Intensive In-House Services?  : No Does patient have [facility] services? : No Does patient have P4CC services?: No  ADL Screening (condition at time of admission) Patient's cognitive ability adequate to safely complete daily activities?: Yes Is the patient deaf or have difficulty hearing?: No Does the patient have difficulty seeing, even when wearing glasses/contacts?: No Does the patient have difficulty concentrating, remembering, or making decisions?: No Patient able to express  need for assistance with ADLs?: Yes Does the patient have difficulty dressing or bathing?: No Independently performs ADLs?: Yes (appropriate for developmental age) Does the patient have difficulty walking or climbing stairs?: No Weakness of Legs: None Weakness of Arms/Hands: None  Home Assistive  Devices/Equipment Home Assistive Devices/Equipment: None  Therapy Consults (therapy consults require a physician order) PT Evaluation Needed: No OT Evalulation Needed: No SLP Evaluation Needed: No Abuse/Neglect Assessment (Assessment to be complete while patient is alone) Physical Abuse: Denies Verbal Abuse: Denies Sexual Abuse: Denies Exploitation of patient/patient's resources: Denies Self-Neglect: Denies Values / Beliefs Cultural Requests During Hospitalization: None Spiritual Requests During Hospitalization: None Consults Spiritual Care Consult Needed: No Social Work Consult Needed: No      Additional Information 1:1 In Past 12 Months?: No CIRT Risk: No Elopement Risk: No Does patient have medical clearance?: Yes  Child/Adolescent Assessment Running Away Risk: Denies  Disposition:  Disposition Initial Assessment Completed for this Encounter: Yes Disposition of Patient: Referred to [facility])  On Site Evaluation by:   Reviewed with Physician:    Ottis Stain 06/27/2017 9:03 PM

## 2017-06-27 NOTE — ED Provider Notes (Signed)
[facility] Emergency Department Provider Note  ____________________________________________   First MD Initiated Contact with Patient 06/27/17 1525     (approximate)  I have reviewed the triage vital signs and the nursing notes.   HISTORY  Chief Complaint Hallucinations and Psychiatric Evaluation   HPI [name] is a 26 y.o. female with a history of depression, ODD and PTSD who is presenting to the emergency department today under involuntary commitment. Patient per her IVC will not take her prescription medicines and has been verbally abusive towards her mother. Also reportedly throwing things at her mother and breaking things at home. Also reports the patient responding to internal stimuli.  Per the patient, she denies any suicidal or homicidal ideation. Does not deny being in an argument with her mother but also does not corroborate was written on the IVC with her breaking things at home and with responding to internal stimuli.   Past Medical History:  Diagnosis Date  . Asthma   . Depression 2009   Inpatient psych admission for SI, dissociative fugue  . Dissociative disorder or reaction 2009  . Eczema   . H/O: suicide attempt   . ODD (oppositional defiant disorder)   . PTSD (post-traumatic stress disorder)   . Schizoaffective disorder (HCC)   . Substance abuse     Patient Active Problem List   Diagnosis Date Noted  . Eczema 05/08/2017  . Borderline personality disorder 11/06/2016  . Cocaine use disorder, severe, dependence (HCC) 11/06/2016  . Cannabis use disorder, moderate, dependence (HCC) 11/06/2016  . Alcohol use disorder, mild, abuse 11/06/2016  . Major depressive disorder, recurrent severe without psychotic features (HCC) 11/06/2016  . Asthma 09/23/2011  . Tobacco use disorder 09/15/2009    Past Surgical History:  Procedure Laterality Date  . ADENOIDECTOMY    . TONSILLECTOMY      Prior to Admission medications     Medication Sig Start Date End Date Taking? Authorizing Provider  albuterol (PROVENTIL HFA;VENTOLIN HFA) 108 (90 Base) MCG/ACT inhaler Inhale 1-2 puffs into the lungs every 4 (four) hours as needed for wheezing or shortness of breath. 05/11/17   Jimmy Footman, MD  ARIPiprazole (ABILIFY) 2 MG tablet Take 1 tablet (2 mg total) by mouth at bedtime. 05/11/17   Jimmy Footman, MD  mirtazapine (REMERON) 15 MG tablet Take 1 tablet (15 mg total) by mouth at bedtime. 05/11/17   Jimmy Footman, MD  triamcinolone cream (KENALOG) 0.1 % Apply topically 2 (two) times daily. 05/11/17   Jimmy Footman, MD    Allergies Banana  Family History  Problem Relation Age of Onset  . Adopted: Yes    Social History Social History  Substance Use Topics  . Smoking status: Current Every Day Smoker    Packs/day: 0.50    Years: 2.00    Types: Cigarettes  . Smokeless tobacco: Never Used  . Alcohol use Yes     Comment: sometimes    Review of Systems  Constitutional: No fever/chills Eyes: No visual changes. ENT: No sore throat. Cardiovascular: Denies chest pain. Respiratory: Denies shortness of breath. Gastrointestinal: No abdominal pain.  No nausea, no vomiting.  No diarrhea.  No constipation. Genitourinary: Negative for dysuria. Musculoskeletal: Negative for back pain. Skin: Negative for rash. Neurological: Negative for headaches, focal weakness or numbness.   ____________________________________________   PHYSICAL EXAM:  VITAL SIGNS: ED Triage Vitals  Enc Vitals Group     BP 06/27/17 1451 104/88     Pulse Rate 06/27/17 1451 65  Resp 06/27/17 1451 18     Temp 06/27/17 1451 98.1 F (36.7 C)     Temp Source 06/27/17 1451 Oral     SpO2 06/27/17 1451 95 %     Weight 06/27/17 1451 115 lb (52.2 kg)     Height 06/27/17 1451 5\' 4"  (1.626 m)     Head Circumference --      Peak Flow --      Pain Score 06/27/17 1446 0     Pain Loc --      Pain Edu?  --      Excl. in GC? --     Constitutional: Alert and oriented. in no acute distress. Eyes: Conjunctivae are normal.  Head: Atraumatic. Nose: No congestion/rhinnorhea. Mouth/Throat: Mucous membranes are moist.  Neck: No stridor.   Cardiovascular: Normal rate, regular rhythm. Grossly normal heart sounds.   Respiratory: Normal respiratory effort.  No retractions. Lungs CTAB. Gastrointestinal: Soft and nontender. No distention.  Musculoskeletal: No lower extremity tenderness nor edema.  No joint effusions. Neurologic:  Normal speech and language. No gross focal neurologic deficits are appreciated. Skin:  Skin is warm, dry and intact. No rash noted. Psychiatric: Mood and affect are normal. Speech and behavior are normal.  ____________________________________________   LABS (all labs ordered are listed, but only abnormal results are displayed)  Labs Reviewed  ACETAMINOPHEN LEVEL - Abnormal; Notable for the following:       Result Value   Acetaminophen (Tylenol), Serum <10 (*)    All other components within normal limits  URINE DRUG SCREEN, QUALITATIVE ([facility] ONLY) - Abnormal; Notable for the following:    Amphetamines, Ur Screen POSITIVE (*)    Cocaine Metabolite,Ur [place] POSITIVE (*)    Cannabinoid 50 Ng, Ur  POSITIVE (*)    Barbiturates, Ur Screen POSITIVE (*)    All other components within normal limits  COMPREHENSIVE METABOLIC PANEL  ETHANOL  SALICYLATE LEVEL  CBC  URINALYSIS, COMPLETE (UACMP) WITH MICROSCOPIC  POC URINE PREG, ED  POCT PREGNANCY, URINE   ____________________________________________  EKG    ____________________________________________  RADIOLOGY   ____________________________________________   PROCEDURES  Procedure(s) performed:   Procedures  Critical Care performed:   ____________________________________________   INITIAL IMPRESSION / ASSESSMENT AND PLAN / ED COURSE  Pertinent labs & imaging results that were available during my care  of the patient were reviewed by me and considered in my medical decision making (see chart for details).  I will uphold involuntary treatment. The patient will be evaluated by specialist on call psychiatry. Multiple positives on the patient's UDS as well as what may be decompensated psychiatric condition at this time.      ____________________________________________   FINAL CLINICAL IMPRESSION(S) / ED DIAGNOSES  Psychosis.    NEW MEDICATIONS STARTED DURING THIS VISIT:  New Prescriptions   No medications on file     Note:  This document was prepared using Dragon voice recognition software and may include unintentional dictation errors.     Myrna BlazerSchaevitz, Ludia Gartland Matthew, MD 06/27/17 [phone]

## 2017-06-27 NOTE — ED Notes (Signed)
Meal tray at bedside.  

## 2017-06-27 NOTE — ED Notes (Signed)
Pt states she has a hx of marijuana and methamphetamine use, but denies any recent use.

## 2017-06-27 NOTE — ED Notes (Signed)
[facility] at this time via computer screen

## 2017-06-27 NOTE — ED Notes (Signed)
This tech spoke with pt again and pt wanted to know how her urine and labs came back. Pt wants to know if the HIV test was positive or negative. Pt was informed that HIV test was not performed and we are waiting on SOC at this time.

## 2017-06-27 NOTE — ED Notes (Signed)
Pt was dress down in triage room 3 by this tech. All pt belongings were placed on pt belonging bag, which was labeled with a green tag and a patient identification label as well. Pt was advised about valuables policy.  This tech transfered pt to room 23 from triage room 3. Pt belonging bag was drop off by nursing station. RN, Osvaldo ShipperAngela aknowledged pt belonging bag.

## 2017-06-28 MED ORDER — TRIAMCINOLONE ACETONIDE 0.1 % EX CREA
TOPICAL_CREAM | Freq: Three times a day (TID) | CUTANEOUS | Status: DC | PRN
  Filled 2017-06-28: qty 15

## 2017-06-28 NOTE — ED Notes (Signed)
Pt requested a snack and to use the phone. Pt's behavior is bizarre, but not aggressive or threatening. Pt has severe eczema. ER MD will be notified. Maintained on 15 minute checks and observation by security camera for safety.

## 2017-06-28 NOTE — ED Notes (Signed)

## 2017-06-28 NOTE — ED Notes (Signed)
Pt staying in bed under the covers.  No needs or concerns at this time.  Maintained on 15 minute checks and observation by security camera for safety.

## 2017-06-28 NOTE — BH Assessment (Signed)
[facility] the [facility].  Bridget Carpenter reported they are full.

## 2017-06-28 NOTE — ED Notes (Signed)
Report was received from Venora MaplesAlicia G., RN; Pt. Verbalizes  complaints of being noncompliant; oppositional; aggressive; and  Distress having arguments with her mother; and destroying property; denies S.I./Hi. Continue to monitor with 15 min. Monitoring.

## 2017-06-28 NOTE — ED Notes (Signed)
Pt IVC. [facility]. Belongings sent with sheriff. Pt compliant, accepting. [facility] called and made aware of patients approximate arrival time.

## 2017-06-28 NOTE — ED Notes (Signed)
Pt has been made aware of her pending [facility]. Pt accepting. Maintained on 15 minute checks and observation by security camera for safety.

## 2017-06-28 NOTE — BH Assessment (Signed)
This Clinical research associatewriter spoke with ED secretary who reports pt is awaiting transportation to inpatient facility.

## 2017-06-28 NOTE — ED Notes (Signed)
Patient resting quietly in room. No noted distress or abnormal behaviors noted. Will continue 15 minute checks and observation by security camera for safety. 

## 2017-06-28 NOTE — BH Assessment (Signed)
Referral information for Child/Adolescent Placement have been faxed to;      [facility] ([phone]),    [facility] [phone]), -on wait list   [facility] [phone]),- on wait list

## 2017-06-28 NOTE — ED Notes (Signed)
Pt received lunch tray 

## 2017-06-28 NOTE — ED Notes (Signed)
Pt came to door stating she needs another tray. RN gave patient a sandwich tray and soft drink. Pt irritable. Maintained on 15 minute checks and observation by security camera for safety.

## 2017-06-28 NOTE — BH Assessment (Signed)
Patient has been [facility].  Patient assigned to room to be determined Accepting physician is Dr. Catha GosselinMikhail  Call report to [phone] Representative was Penni BombardKendall.  ER Staff is aware of it Marchelle Folks([facility] & Claris CheMargaret Patient's Nurse)

## 2017-06-28 NOTE — ED Notes (Signed)
Pt received breakfast tray 

## 2017-06-28 NOTE — ED Provider Notes (Signed)
-----------------------------------------   6:34 AM on 06/28/2017 -----------------------------------------   Blood pressure 104/88, pulse 65, temperature 98.1 F (36.7 C), temperature source Oral, resp. rate 18, height 1.626 m (5\' 4" ), weight 52.2 kg (115 lb), SpO2 95 %.  Calm and cooperative at this time.  As per TTS note:  Patient [facility].  Patient assigned to room to be determined Accepting physician is Dr. Catha GosselinMikhail  Call report to [phone] Representative was Penni BombardKendall.    Finished EMTALA documentation.  Patient awaiting transport.     Loleta RoseForbach, Jeny Nield, MD 06/28/17 [phone]

## 2017-07-11 ENCOUNTER — Encounter: Payer: Self-pay | Admitting: Emergency Medicine

## 2017-07-11 DIAGNOSIS — Z87891 Personal history of nicotine dependence: Secondary | ICD-10-CM | POA: Diagnosis not present

## 2017-07-11 DIAGNOSIS — Z5321 Procedure and treatment not carried out due to patient leaving prior to being seen by health care provider: Secondary | ICD-10-CM

## 2017-07-11 DIAGNOSIS — R21 Rash and other nonspecific skin eruption: Secondary | ICD-10-CM | POA: Insufficient documentation

## 2017-07-11 DIAGNOSIS — B86 Scabies: Secondary | ICD-10-CM | POA: Diagnosis not present

## 2017-07-11 DIAGNOSIS — J45909 Unspecified asthma, uncomplicated: Secondary | ICD-10-CM | POA: Diagnosis not present

## 2017-07-11 DIAGNOSIS — Z79899 Other long term (current) drug therapy: Secondary | ICD-10-CM | POA: Diagnosis not present

## 2017-07-11 NOTE — ED Triage Notes (Signed)
Patient states that she is here for a rash that she developed to bilateral arms, legs and trunk. Patient states that she thinks that she was exposed to lyme's disease and an std.

## 2017-07-11 NOTE — ED Notes (Signed)
Patient ambulatory to stat desk with steady gait without distress noted with complaint generalized weakness.  Patient refusing to put hospital id bracelet on stating it causes skin irritation.

## 2017-07-12 ENCOUNTER — Emergency Department
Admission: EM | Admit: 2017-07-12 | Discharge: 2017-07-12 | Disposition: A | Discharge status: Home / Self Care | Payer: Medicaid Other | Attending: Emergency Medicine | Admitting: Emergency Medicine

## 2017-07-12 ENCOUNTER — Emergency Department
Admission: EM | Admit: 2017-07-12 | Discharge: 2017-07-12 | Disposition: A | Discharge status: Home / Self Care | Payer: Medicaid Other | Source: Home / Self Care

## 2017-07-12 DIAGNOSIS — Z87891 Personal history of nicotine dependence: Secondary | ICD-10-CM | POA: Insufficient documentation

## 2017-07-12 DIAGNOSIS — J45909 Unspecified asthma, uncomplicated: Secondary | ICD-10-CM | POA: Insufficient documentation

## 2017-07-12 DIAGNOSIS — B86 Scabies: Secondary | ICD-10-CM | POA: Insufficient documentation

## 2017-07-12 DIAGNOSIS — Z79899 Other long term (current) drug therapy: Secondary | ICD-10-CM | POA: Insufficient documentation

## 2017-07-12 MED ORDER — IVERMECTIN 3 MG PO TABS: 200.0000 ug/kg | ORAL_TABLET | Freq: Once | ORAL | 1 refills | Status: AC
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
## 2017-07-12 NOTE — ED Notes (Signed)
FIRST NURSE NOTE-pt would like to be seen for rash today. No respiratory distress. Ambulatory to check in

## 2017-07-12 NOTE — ED Provider Notes (Signed)
[facility] Emergency Department Provider Note  ____________________________________________  Time seen: Approximately 8:05 PM  I have reviewed the triage vital signs and the nursing notes.   HISTORY  Chief Complaint Rash    HPI [name] Tesmer is a 26 y.o. female with a history of scabies, depression, ODD, paranoia and PTSD presents to the emergency department with diffuse pruritic skin and burrows along the flexural surfaces of the wrists, axilla, knees and ankles. Patient states that she needs more than a "cream". No alleviating measures have been attempted. Patient denies suicidal and homicidal ideation.   Past Medical History:  Diagnosis Date  . Asthma   . Depression 2009   Inpatient psych admission for SI, dissociative fugue  . Dissociative disorder or reaction 2009  . Eczema   . H/O: suicide attempt   . ODD (oppositional defiant disorder)   . PTSD (post-traumatic stress disorder)   . Schizoaffective disorder (HCC)   . Substance abuse     Patient Active Problem List   Diagnosis Date Noted  . Eczema 05/08/2017  . Borderline personality disorder 11/06/2016  . Cocaine use disorder, severe, dependence (HCC) 11/06/2016  . Cannabis use disorder, moderate, dependence (HCC) 11/06/2016  . Alcohol use disorder, mild, abuse 11/06/2016  . Major depressive disorder, recurrent severe without psychotic features (HCC) 11/06/2016  . Asthma 09/23/2011  . Tobacco use disorder 09/15/2009    Past Surgical History:  Procedure Laterality Date  . ADENOIDECTOMY    . TONSILLECTOMY      Prior to Admission medications   Medication Sig Start Date End Date Taking? Authorizing Provider  albuterol (PROVENTIL HFA;VENTOLIN HFA) 108 (90 Base) MCG/ACT inhaler Inhale 1-2 puffs into the lungs every 4 (four) hours as needed for wheezing or shortness of breath. 05/11/17   [name], Andrea, MD  ARIPiprazole (ABILIFY) 2 MG tablet Take 1 tablet (2 mg total) by mouth  at bedtime. 05/11/17   [name], Andrea, MD  ivermectin (STROMECTOL) 3 MG TABS tablet Take 3.5 tablets (10,500 mcg total) by mouth once. 07/12/17 07/12/17  Orvil FeilWoods, Quy Lotts M, PA-C  mirtazapine (REMERON) 15 MG tablet Take 1 tablet (15 mg total) by mouth at bedtime. 05/11/17   [name], Andrea, MD  triamcinolone cream (KENALOG) 0.1 % Apply topically 2 (two) times daily. 05/11/17   [name], Andrea, MD    Allergies Banana  Family History  Problem Relation Age of Onset  . Adopted: Yes    Social History Social History  Substance Use Topics  . Smoking status: Former Smoker    Packs/day: 0.50    Years: 2.00    Types: Cigarettes  . Smokeless tobacco: Never Used  . Alcohol use Yes     Comment: sometimes     Review of Systems  Constitutional: No fever/chills Eyes: No visual changes. No discharge ENT: No upper respiratory complaints. Cardiovascular: no chest pain. Respiratory: no cough. No SOB. Musculoskeletal: Negative for musculoskeletal pain. Skin: Patient has scabies Neurological: Negative for headaches, focal weakness or numbness.   ____________________________________________   PHYSICAL EXAM:  VITAL SIGNS: ED Triage Vitals  Enc Vitals Group     BP 07/12/17 1920 108/65     Pulse Rate 07/12/17 1920 93     Resp 07/12/17 1920 18     Temp 07/12/17 1920 (!) 97.4 F (36.3 C)     Temp Source 07/12/17 1920 Oral     SpO2 07/12/17 1920 100 %     Weight 07/12/17 1922 115 lb (52.2 kg)     Height 07/12/17 1922  5\' 5"  (1.651 m)     Head Circumference --      Peak Flow --      Pain Score 07/12/17 1926 0     Pain Loc --      Pain Edu? --      Excl. in GC? --      Constitutional: Alert and oriented. Well appearing and in no acute distress. Eyes: Conjunctivae are normal. PERRL. EOMI. Head: Atraumatic. Cardiovascular: Normal rate, regular rhythm. Normal S1 and S2.  Good peripheral circulation. Respiratory: Normal respiratory effort without tachypnea or  retractions. Lungs CTAB. Good air entry to the bases with no decreased or absent breath sounds. Gastrointestinal: Bowel sounds 4 quadrants. Soft and nontender to palpation. No guarding or rigidity. No palpable masses. No distention. No CVA tenderness. Musculoskeletal: Full range of motion to all extremities. No gross deformities appreciated. Neurologic:  Normal speech and language. No gross focal neurologic deficits are appreciated.  Skin: Patient has diffuse burrows and secondary eczematous dermatitis. Psychiatric: Mood and affect are normal. Speech and behavior are normal. Patient exhibits appropriate insight and judgement.   ____________________________________________   LABS (all labs ordered are listed, but only abnormal results are displayed)  Labs Reviewed - No data to display ____________________________________________  EKG   ____________________________________________  RADIOLOGY   No results found.  ____________________________________________    PROCEDURES  Procedure(s) performed:    Procedures    Medications - No data to display   ____________________________________________   INITIAL IMPRESSION / ASSESSMENT AND PLAN / ED COURSE  Pertinent labs & imaging results that were available during my care of the patient were reviewed by me and considered in my medical decision making (see chart for details).  Review of the [place] CSRS was performed in accordance of the [facility] prior to dispensing any controlled drugs.    Assessment and Plan: Scabies Patient's diagnosis is consistent with scabies. Patient will be discharged home with prescriptions for ivermectin. Patient is to follow up with primary care as needed or otherwise directed. Patient is given ED precautions to return to the ED for any worsening or new symptoms. All patient questions were answered.     ____________________________________________  FINAL CLINICAL IMPRESSION(S) / ED  DIAGNOSES  Final diagnoses:  Scabies      NEW MEDICATIONS STARTED DURING THIS VISIT:  New Prescriptions   IVERMECTIN (STROMECTOL) 3 MG TABS TABLET    Take 3.5 tablets (10,500 mcg total) by mouth once.        This chart was dictated using voice recognition software/Dragon. Despite best efforts to proofread, errors can occur which can change the meaning. Any change was purely unintentional.    Gasper Lloyd 07/12/17 2012    Phineas Semen, MD 07/12/17 2051

## 2017-07-12 NOTE — ED Triage Notes (Signed)
Pt states "i think I have a lot of communicable diseases". Pt states she thinks she has HIV, lyme disease, MRSA and aytopic dermatitis. Pt also complains of eye redness, itchy skin "all over my body" and bilateral eye tearing. Pt states she was on doxycycline but stopped and began keflex "the red kind".

## 2017-07-16 ENCOUNTER — Encounter: Payer: Self-pay | Admitting: *Deleted

## 2017-07-16 ENCOUNTER — Emergency Department: Payer: Medicaid Other

## 2017-07-16 ENCOUNTER — Inpatient Hospital Stay
Admission: EM | Admit: 2017-07-16 | Discharge: 2017-07-18 | DRG: 917 | Disposition: A | Discharge status: Other Acute Inpatient Hospital | Payer: Medicaid Other | Attending: Internal Medicine | Admitting: Internal Medicine

## 2017-07-16 DIAGNOSIS — T50902A Poisoning by unspecified drugs, medicaments and biological substances, intentional self-harm, initial encounter: Secondary | ICD-10-CM

## 2017-07-16 DIAGNOSIS — F259 Schizoaffective disorder, unspecified: Secondary | ICD-10-CM | POA: Diagnosis present

## 2017-07-16 DIAGNOSIS — E873 Alkalosis: Secondary | ICD-10-CM | POA: Diagnosis present

## 2017-07-16 DIAGNOSIS — T43591A Poisoning by other antipsychotics and neuroleptics, accidental (unintentional), initial encounter: Secondary | ICD-10-CM | POA: Diagnosis present

## 2017-07-16 DIAGNOSIS — Z22322 Carrier or suspected carrier of Methicillin resistant Staphylococcus aureus: Secondary | ICD-10-CM | POA: Diagnosis not present

## 2017-07-16 DIAGNOSIS — F332 Major depressive disorder, recurrent severe without psychotic features: Secondary | ICD-10-CM | POA: Diagnosis present

## 2017-07-16 DIAGNOSIS — J9601 Acute respiratory failure with hypoxia: Secondary | ICD-10-CM | POA: Diagnosis not present

## 2017-07-16 DIAGNOSIS — T43211A Poisoning by selective serotonin and norepinephrine reuptake inhibitors, accidental (unintentional), initial encounter: Secondary | ICD-10-CM | POA: Diagnosis present

## 2017-07-16 DIAGNOSIS — R402342 Coma scale, best motor response, flexion withdrawal, at arrival to emergency department: Secondary | ICD-10-CM | POA: Diagnosis present

## 2017-07-16 DIAGNOSIS — R402122 Coma scale, eyes open, to pain, at arrival to emergency department: Secondary | ICD-10-CM | POA: Diagnosis present

## 2017-07-16 DIAGNOSIS — T50901A Poisoning by unspecified drugs, medicaments and biological substances, accidental (unintentional), initial encounter: Secondary | ICD-10-CM | POA: Diagnosis present

## 2017-07-16 DIAGNOSIS — F913 Oppositional defiant disorder: Secondary | ICD-10-CM | POA: Diagnosis present

## 2017-07-16 DIAGNOSIS — E876 Hypokalemia: Secondary | ICD-10-CM | POA: Diagnosis present

## 2017-07-16 DIAGNOSIS — Z91018 Allergy to other foods: Secondary | ICD-10-CM | POA: Diagnosis not present

## 2017-07-16 DIAGNOSIS — Z9119 Patient's noncompliance with other medical treatment and regimen: Secondary | ICD-10-CM

## 2017-07-16 DIAGNOSIS — F4310 Post-traumatic stress disorder, unspecified: Secondary | ICD-10-CM | POA: Diagnosis present

## 2017-07-16 DIAGNOSIS — F1910 Other psychoactive substance abuse, uncomplicated: Secondary | ICD-10-CM

## 2017-07-16 DIAGNOSIS — F603 Borderline personality disorder: Secondary | ICD-10-CM | POA: Diagnosis present

## 2017-07-16 DIAGNOSIS — Z915 Personal history of self-harm: Secondary | ICD-10-CM

## 2017-07-16 DIAGNOSIS — Z87891 Personal history of nicotine dependence: Secondary | ICD-10-CM

## 2017-07-16 DIAGNOSIS — J690 Pneumonitis due to inhalation of food and vomit: Secondary | ICD-10-CM | POA: Diagnosis present

## 2017-07-16 DIAGNOSIS — L309 Dermatitis, unspecified: Secondary | ICD-10-CM | POA: Diagnosis present

## 2017-07-16 DIAGNOSIS — R402222 Coma scale, best verbal response, incomprehensible words, at arrival to emergency department: Secondary | ICD-10-CM | POA: Diagnosis present

## 2017-07-16 DIAGNOSIS — J45909 Unspecified asthma, uncomplicated: Secondary | ICD-10-CM | POA: Diagnosis present

## 2017-07-16 DIAGNOSIS — J9600 Acute respiratory failure, unspecified whether with hypoxia or hypercapnia: Secondary | ICD-10-CM | POA: Diagnosis present

## 2017-07-16 DIAGNOSIS — Z818 Family history of other mental and behavioral disorders: Secondary | ICD-10-CM | POA: Diagnosis not present

## 2017-07-16 DIAGNOSIS — F909 Attention-deficit hyperactivity disorder, unspecified type: Secondary | ICD-10-CM | POA: Diagnosis present

## 2017-07-16 DIAGNOSIS — G92 Toxic encephalopathy: Secondary | ICD-10-CM | POA: Diagnosis present

## 2017-07-16 DIAGNOSIS — F449 Dissociative and conversion disorder, unspecified: Secondary | ICD-10-CM | POA: Diagnosis present

## 2017-07-16 DIAGNOSIS — T6591XA Toxic effect of unspecified substance, accidental (unintentional), initial encounter: Secondary | ICD-10-CM | POA: Diagnosis present

## 2017-07-16 DIAGNOSIS — T50902D Poisoning by unspecified drugs, medicaments and biological substances, intentional self-harm, subsequent encounter: Secondary | ICD-10-CM | POA: Diagnosis not present

## 2017-07-16 LAB — URINALYSIS, COMPLETE (UACMP) WITH MICROSCOPIC
BACTERIA UA: NONE SEEN
BILIRUBIN URINE: NEGATIVE
GLUCOSE, UA: NEGATIVE mg/dL
HGB URINE DIPSTICK: NEGATIVE
Ketones, ur: NEGATIVE mg/dL
Leukocytes, UA: NEGATIVE
NITRITE: NEGATIVE
PROTEIN: NEGATIVE mg/dL
Specific Gravity, Urine: 1.015 (ref 1.005–1.030)
pH: 6 (ref 5.0–8.0)

## 2017-07-16 LAB — URINE DRUG SCREEN, QUALITATIVE (ARMC ONLY)
Amphetamines, Ur Screen: POSITIVE — AB
BENZODIAZEPINE, UR SCRN: NOT DETECTED
Barbiturates, Ur Screen: NOT DETECTED
CANNABINOID 50 NG, UR ~~LOC~~: POSITIVE — AB
Cocaine Metabolite,Ur ~~LOC~~: POSITIVE — AB
MDMA (Ecstasy)Ur Screen: NOT DETECTED
Methadone Scn, Ur: NOT DETECTED
OPIATE, UR SCREEN: NOT DETECTED
PHENCYCLIDINE (PCP) UR S: NOT DETECTED
Tricyclic, Ur Screen: POSITIVE — AB

## 2017-07-16 LAB — BLOOD GAS, ARTERIAL
ACID-BASE EXCESS: 4.2 mmol/L — AB (ref 0.0–2.0)
BICARBONATE: 27.1 mmol/L (ref 20.0–28.0)
FIO2: 0.4
LHR: 16 {breaths}/min
MECHVT: 450 mL
O2 SAT: 97.7 %
PATIENT TEMPERATURE: 37
PCO2 ART: 34 mmHg (ref 32.0–48.0)
PEEP/CPAP: 5 cmH2O
PO2 ART: 89 mmHg (ref 83.0–108.0)
pH, Arterial: 7.51 — ABNORMAL HIGH (ref 7.350–7.450)

## 2017-07-16 LAB — CBC WITH DIFFERENTIAL/PLATELET
BASOS PCT: 1 %
Basophils Absolute: 0 10*3/uL (ref 0–0.1)
EOS ABS: 0.2 10*3/uL (ref 0–0.7)
EOS PCT: 4 %
HCT: 36 % (ref 35.0–47.0)
HEMOGLOBIN: 12.1 g/dL (ref 12.0–16.0)
Lymphocytes Relative: 27 %
Lymphs Abs: 1.8 10*3/uL (ref 1.0–3.6)
MCH: 31.6 pg (ref 26.0–34.0)
MCHC: 33.7 g/dL (ref 32.0–36.0)
MCV: 93.8 fL (ref 80.0–100.0)
MONOS PCT: 11 %
Monocytes Absolute: 0.7 10*3/uL (ref 0.2–0.9)
NEUTROS PCT: 57 %
Neutro Abs: 3.9 10*3/uL (ref 1.4–6.5)
PLATELETS: 205 10*3/uL (ref 150–440)
RBC: 3.84 MIL/uL (ref 3.80–5.20)
RDW: 13.5 % (ref 11.5–14.5)
WBC: 6.7 10*3/uL (ref 3.6–11.0)

## 2017-07-16 LAB — PHOSPHORUS: PHOSPHORUS: 3.4 mg/dL (ref 2.5–4.6)

## 2017-07-16 LAB — COMPREHENSIVE METABOLIC PANEL
ALBUMIN: 3.7 g/dL (ref 3.5–5.0)
ALK PHOS: 48 U/L (ref 38–126)
ALT: 11 U/L — ABNORMAL LOW (ref 14–54)
ANION GAP: 9 (ref 5–15)
AST: 22 U/L (ref 15–41)
BUN: 15 mg/dL (ref 6–20)
CALCIUM: 8.6 mg/dL — AB (ref 8.9–10.3)
CHLORIDE: 111 mmol/L (ref 101–111)
CO2: 25 mmol/L (ref 22–32)
Creatinine, Ser: 0.75 mg/dL (ref 0.44–1.00)
GFR calc Af Amer: >60 mL/min (ref >60)
GFR calc non Af Amer: >60 mL/min (ref >60)
GLUCOSE: 92 mg/dL (ref 65–99)
Potassium: 3.4 mmol/L — ABNORMAL LOW (ref 3.5–5.1)
SODIUM: 145 mmol/L (ref 135–145)
Total Bilirubin: 1.4 mg/dL — ABNORMAL HIGH (ref 0.3–1.2)
Total Protein: 7 g/dL (ref 6.5–8.1)

## 2017-07-16 LAB — GLUCOSE, CAPILLARY
GLUCOSE-CAPILLARY: 77 mg/dL (ref 65–99)
Glucose-Capillary: 88 mg/dL (ref 65–99)

## 2017-07-16 LAB — MRSA PCR SCREENING: MRSA by PCR: POSITIVE — AB

## 2017-07-16 LAB — CBC
HEMATOCRIT: 41.1 % (ref 35.0–47.0)
HEMOGLOBIN: 13.3 g/dL (ref 12.0–16.0)
MCH: 30.5 pg (ref 26.0–34.0)
MCHC: 32.5 g/dL (ref 32.0–36.0)
MCV: 94.1 fL (ref 80.0–100.0)
Platelets: 215 10*3/uL (ref 150–440)
RBC: 4.37 MIL/uL (ref 3.80–5.20)
RDW: 14 % (ref 11.5–14.5)
WBC: 11.7 10*3/uL — AB (ref 3.6–11.0)

## 2017-07-16 LAB — CREATININE, SERUM: Creatinine, Ser: 0.77 mg/dL (ref 0.44–1.00)

## 2017-07-16 LAB — HCG, QUANTITATIVE, PREGNANCY

## 2017-07-16 LAB — TRIGLYCERIDES: TRIGLYCERIDES: 42 mg/dL (ref <150)

## 2017-07-16 LAB — MAGNESIUM: MAGNESIUM: 2.3 mg/dL (ref 1.7–2.4)

## 2017-07-16 LAB — TROPONIN I: Troponin I: <0.03 ng/mL (ref <0.03)

## 2017-07-16 LAB — ETHANOL

## 2017-07-16 LAB — PROCALCITONIN

## 2017-07-16 MED ORDER — ACETAMINOPHEN 325 MG PO TABS
650.0000 mg | ORAL_TABLET | Freq: Four times a day (QID) | ORAL | Status: DC | PRN
  Administered 2017-07-18: 650 mg via ORAL
  Filled 2017-07-16: qty 2

## 2017-07-16 MED ORDER — IPRATROPIUM-ALBUTEROL 0.5-2.5 (3) MG/3ML IN SOLN
3.0000 mL | Freq: Four times a day (QID) | RESPIRATORY_TRACT | Status: DC
  Administered 2017-07-16 – 2017-07-17 (×3): 3 mL via RESPIRATORY_TRACT
  Filled 2017-07-16 (×2): qty 3

## 2017-07-16 MED ORDER — PIPERACILLIN-TAZOBACTAM 3.375 G IVPB 30 MIN
3.3750 g | Freq: Once | INTRAVENOUS | Status: AC
  Administered 2017-07-16: 3.375 g via INTRAVENOUS

## 2017-07-16 MED ORDER — CHLORHEXIDINE GLUCONATE 0.12% ORAL RINSE (MEDLINE KIT)
15.0000 mL | Freq: Two times a day (BID) | OROMUCOSAL | Status: DC
  Administered 2017-07-16: 15 mL via OROMUCOSAL

## 2017-07-16 MED ORDER — CHLORHEXIDINE GLUCONATE 0.12% ORAL RINSE (MEDLINE KIT): 15.0000 mL | Freq: Two times a day (BID) | OROMUCOSAL | Status: DC

## 2017-07-16 MED ORDER — SUCCINYLCHOLINE CHLORIDE 20 MG/ML IJ SOLN
100.0000 mg | Freq: Once | INTRAMUSCULAR | Status: AC
  Administered 2017-07-16: 100 mg via INTRAVENOUS

## 2017-07-16 MED ORDER — VITAL HIGH PROTEIN PO LIQD
1000.0000 mL | ORAL | Status: DC
  Administered 2017-07-16: 1000 mL
  Administered 2017-07-17: 10:00:00

## 2017-07-16 MED ORDER — POTASSIUM CHLORIDE 2 MEQ/ML IV SOLN
INTRAVENOUS | Status: DC
  Administered 2017-07-16: 19:00:00 via INTRAVENOUS
  Filled 2017-07-16 (×2): qty 1000

## 2017-07-16 MED ORDER — PROPOFOL 1000 MG/100ML IV EMUL
5.0000 ug/kg/min | INTRAVENOUS | Status: DC
  Administered 2017-07-16 – 2017-07-17 (×2): 20 ug/kg/min via INTRAVENOUS
  Filled 2017-07-16 (×2): qty 100

## 2017-07-16 MED ORDER — ENOXAPARIN SODIUM 40 MG/0.4ML ~~LOC~~ SOLN
40.0000 mg | SUBCUTANEOUS | Status: DC
  Administered 2017-07-16 – 2017-07-17 (×2): 40 mg via SUBCUTANEOUS
  Filled 2017-07-16 (×3): qty 0.4

## 2017-07-16 MED ORDER — PRO-STAT SUGAR FREE PO LIQD
30.0000 mL | Freq: Two times a day (BID) | ORAL | Status: DC
  Administered 2017-07-16: 30 mL

## 2017-07-16 MED ORDER — ONDANSETRON HCL 4 MG PO TABS: 4.0000 mg | ORAL_TABLET | Freq: Four times a day (QID) | ORAL | Status: DC | PRN

## 2017-07-16 MED ORDER — PANTOPRAZOLE SODIUM 40 MG IV SOLR
40.0000 mg | Freq: Every day | INTRAVENOUS | Status: DC
  Administered 2017-07-16: 40 mg via INTRAVENOUS
  Filled 2017-07-16: qty 40

## 2017-07-16 MED ORDER — ORAL CARE MOUTH RINSE: 15.0000 mL | Freq: Four times a day (QID) | OROMUCOSAL | Status: DC

## 2017-07-16 MED ORDER — ONDANSETRON HCL 4 MG/2ML IJ SOLN: 4.0000 mg | Freq: Four times a day (QID) | INTRAMUSCULAR | Status: DC | PRN

## 2017-07-16 MED ORDER — LORAZEPAM 2 MG/ML IJ SOLN
2.0000 mg | Freq: Once | INTRAMUSCULAR | Status: AC
  Administered 2017-07-16: 2 mg via INTRAVENOUS

## 2017-07-16 MED ORDER — ETOMIDATE 2 MG/ML IV SOLN
20.0000 mg | Freq: Once | INTRAVENOUS | Status: AC
  Administered 2017-07-16: 20 mg via INTRAVENOUS

## 2017-07-16 MED ORDER — ACETAMINOPHEN 650 MG RE SUPP
650.0000 mg | Freq: Four times a day (QID) | RECTAL | Status: DC | PRN
Start: 2017-07-16 — End: 2017-07-18

## 2017-07-16 MED ORDER — IPRATROPIUM-ALBUTEROL 0.5-2.5 (3) MG/3ML IN SOLN
3.0000 mL | Freq: Four times a day (QID) | RESPIRATORY_TRACT | Status: DC | PRN
  Administered 2017-07-17: 3 mL via RESPIRATORY_TRACT
  Filled 2017-07-16 (×2): qty 3

## 2017-07-16 MED ORDER — MUPIROCIN 2 % EX OINT
1.0000 "application " | TOPICAL_OINTMENT | Freq: Two times a day (BID) | CUTANEOUS | Status: DC
  Administered 2017-07-16 – 2017-07-18 (×4): 1 via NASAL
  Filled 2017-07-16: qty 22

## 2017-07-16 MED ORDER — FAMOTIDINE IN NACL 20-0.9 MG/50ML-% IV SOLN
20.0000 mg | INTRAVENOUS | Status: DC
  Administered 2017-07-16: 20 mg via INTRAVENOUS
  Filled 2017-07-16: qty 50

## 2017-07-16 MED ORDER — PIPERACILLIN-TAZOBACTAM 3.375 G IVPB 30 MIN
INTRAVENOUS | Status: AC
  Filled 2017-07-16: qty 50

## 2017-07-16 MED ORDER — CHLORHEXIDINE GLUCONATE CLOTH 2 % EX PADS
6.0000 | MEDICATED_PAD | Freq: Every day | CUTANEOUS | Status: DC
  Administered 2017-07-17 – 2017-07-18 (×2): 6 via TOPICAL

## 2017-07-16 MED ORDER — VECURONIUM BROMIDE 10 MG IV SOLR
10.0000 mg | Freq: Once | INTRAVENOUS | Status: AC
Start: 2017-07-16 — End: 2017-07-16
  Administered 2017-07-16: 10 mg via INTRAVENOUS

## 2017-07-16 MED ORDER — LORAZEPAM 2 MG/ML IJ SOLN
INTRAMUSCULAR | Status: AC
  Filled 2017-07-16: qty 1

## 2017-07-16 MED ORDER — ORAL CARE MOUTH RINSE
15.0000 mL | OROMUCOSAL | Status: DC
  Administered 2017-07-16 – 2017-07-17 (×6): 15 mL via OROMUCOSAL

## 2017-07-16 NOTE — Progress Notes (Signed)
While rounding, CH made initial visit to room IC-08. Pt was sleeping and being attended to by her mother. Granddaughter was also in the room. (Child)  CH spent some time with granddaughter who aspires to be an MD someday.    07/16/17 1800  Clinical Encounter Type  Visited With Patient;Patient and family together  Visit Type Initial;Spiritual support;Critical Care  Consult/Referral To Chaplain  Spiritual Encounters  Spiritual Needs Emotional

## 2017-07-16 NOTE — Progress Notes (Signed)
Per Dr. Sung AmabileSimonds orders, the ETT was pulled back about 2.5 cm. The ETT is now at 21 at the lip.

## 2017-07-16 NOTE — Consult Note (Signed)
PULMONARY / CRITICAL CARE MEDICINE   Name: [name] Salone MRN: [id number] DOB: 11/29/1991    ADMISSION DATE:  07/16/2017 CONSULTATION DATE: 07/16/2017  REFERRING MD:  Dr. Anne HahnWillis   CHIEF COMPLAINT: Possible Drug Overdose   HISTORY OF PRESENT ILLNESS:   This is a 26 yo female with a PMH of Substance Abuse, Schizoaffective Disorder, Oppositional Defiant Disorder, Suicide Attempt, Depression, and Asthma.  [facility] via EMS from home due to possible drug overdose.  Per ER notes according to EMS pts family reported hearing her scream "I am going to die" and then they found her minimally responsive to painful stimulation only.  Upon EMS arrival she was given 2 mg narcan with no improvement in mentation.  It was also reported pill bottles of seroquel and trazodone were found at the scene, however the amount ingested by the pt is unknown. In the ER she required mechanical intubation due to an inability to protect her airway. Pt subsequently admitted to the ICU by hospitalist team for further workup and treatment PCCM consulted.  PAST MEDICAL HISTORY :  She  has a past medical history of Asthma; Depression (2009); Dissociative disorder or reaction (2009); Eczema; H/O: suicide attempt; ODD (oppositional defiant disorder); PTSD (post-traumatic stress disorder); Schizoaffective disorder (HCC); and Substance abuse.  PAST SURGICAL HISTORY: She  has a past surgical history that includes Tonsillectomy and Adenoidectomy.  Allergies  Allergen Reactions  . Banana Hives    No current facility-administered medications on file prior to encounter.    Current Outpatient Prescriptions on File Prior to Encounter  Medication Sig  . albuterol (PROVENTIL HFA;VENTOLIN HFA) 108 (90 Base) MCG/ACT inhaler Inhale 1-2 puffs into the lungs every 4 (four) hours as needed for wheezing or shortness of breath.  . triamcinolone cream (KENALOG) 0.1 % Apply topically 2 (two) times daily.  . ARIPiprazole  (ABILIFY) 2 MG tablet Take 1 tablet (2 mg total) by mouth at bedtime. (Patient not taking: Reported on 07/16/2017)  . mirtazapine (REMERON) 15 MG tablet Take 1 tablet (15 mg total) by mouth at bedtime. (Patient not taking: Reported on 07/16/2017)    FAMILY HISTORY:  Her is adopted.    SOCIAL HISTORY: She  reports that she has quit smoking. Her smoking use included Cigarettes. She has a 1.00 pack-year smoking history. She has never used smokeless tobacco. She reports that she drinks alcohol. She reports that she does not use drugs.  REVIEW OF SYSTEMS:   Unable to assess pt mechanically intubated   SUBJECTIVE:  Unable to assess pt mechanically intubated   VITAL SIGNS: BP 120/77   Pulse 92   Resp 16   Ht 5\' 5"  (1.651 m)   Wt 52.2 kg (115 lb)   LMP 06/26/2017 (Approximate)   SpO2 98%   BMI 19.14 kg/m   HEMODYNAMICS:    VENTILATOR SETTINGS: Vent Mode: AC FiO2 (%):  [40 %] 40 % Set Rate:  [16 bmp] 16 bmp Vt Set:  [450 mL] 450 mL PEEP:  [5 cmH20] 5 cmH20  INTAKE / OUTPUT: No intake/output data recorded.  PHYSICAL EXAMINATION: General: acutely ill appearing AA female, mechanically intubated Neuro: sedated, unresponsive, PERRL HEENT: supple, no JVD Cardiovascular: s1s2, rrr, no M/R/G Lungs: rhonchi throughout, even, non labored; no rales, crackles, or wheezes  Abdomen: +BS x4, soft, non distended Musculoskeletal: normal bulk and tone, no edema  Skin: intact no rashes or lesions  LABS:  BMET  Recent Labs Lab 07/16/17 1025  NA 145  K 3.4*  CL 111  CO2 25  BUN 15  CREATININE 0.75  GLUCOSE 92    Electrolytes  Recent Labs Lab 07/16/17 1025  CALCIUM 8.6*    CBC  Recent Labs Lab 07/16/17 1025  WBC 6.7  HGB 12.1  HCT 36.0  PLT 205    Coag's No results for input(s): APTT, INR in the last 168 hours.  Sepsis Markers No results for input(s): LATICACIDVEN, PROCALCITON, O2SATVEN in the last 168 hours.  ABG  Recent Labs Lab 07/16/17 1523  PHART  7.51*  PCO2ART 34  PO2ART 89    Liver Enzymes  Recent Labs Lab 07/16/17 1025  AST 22  ALT 11*  ALKPHOS 48  BILITOT 1.4*  ALBUMIN 3.7    Cardiac Enzymes  Recent Labs Lab 07/16/17 1025  TROPONINI <0.03    Glucose  Recent Labs Lab 07/16/17 1103  GLUCAP 88    Imaging Dg Chest 1 View  Result Date: 07/16/2017 CLINICAL DATA:  Altered mental status.  Dyspnea. EXAM: CHEST 1 VIEW COMPARISON:  03/13/2017 FINDINGS: Multiple telemetry leads overlie the chest. The cardiomediastinal silhouette is within normal limits. The lungs are less well inflated than on the prior study, and there is minimally increased infrahilar density in the medial right lung base compared to the prior study. The lungs are otherwise clear. No pleural effusion or pneumothorax is identified. No acute osseous abnormality is seen. IMPRESSION: Minimal right infrahilar opacity, likely atelectasis though aspiration is not excluded. Electronically Signed   By: Sebastian Ache M.D.   On: 07/16/2017 13:54   Dg Abdomen 1 View  Result Date: 07/16/2017 CLINICAL DATA:  Orogastric tube placement EXAM: ABDOMEN - 1 VIEW COMPARISON:  None. FINDINGS: Orogastric tube tip is in the proximal stomach. Side port is not seen but likely is at the approximate level of the gastroesophageal junction. There is diffuse stool throughout much of the colon. There is no bowel dilatation or air-fluid level to suggest bowel obstruction. No free air. There is a probable small phlebolith in the right pelvis. IMPRESSION: Orogastric tube tip in proximal stomach. Side port not seen. Suspect side-port near gastroesophageal junction. Advise advancing orogastric tube 6-8 cm to insure that with tube tip and side port are well within the stomach. Fairly diffuse stool throughout colon. No bowel obstruction or free air evident. Electronically Signed   By: Bretta Bang III M.D.   On: 07/16/2017 14:40   Ct Head Wo Contrast  Result Date: 07/16/2017 CLINICAL  DATA:  Altered mental status/nonresponsive EXAM: CT HEAD WITHOUT CONTRAST TECHNIQUE: Contiguous axial images were obtained from the base of the skull through the vertex without intravenous contrast. COMPARISON:  October 22, 2016 FINDINGS: Brain: The ventricles are normal in size and configuration. There is no intracranial mass, hemorrhage, extra-axial fluid collection, or midline shift. Gray-white compartments are normal. No evident acute infarct. Vascular: No hyperdense vessel. No vascular calcifications are evident. Skull: The bony calvarium appears intact. Sinuses/Orbits: Visualized paranasal sinuses are clear. Visualized orbits appear symmetric bilaterally. Other: Visualized mastoid air cells are clear. IMPRESSION: Study within normal limits. Electronically Signed   By: Bretta Bang III M.D.   On: 07/16/2017 12:01   Dg Chest Portable 1 View  Result Date: 07/16/2017 CLINICAL DATA:  Intubation. EXAM: PORTABLE CHEST 1 VIEW COMPARISON:  07/16/2017 . FINDINGS: Endotracheal tube tip noted 2 cm above the carina. NG tube tip noted below left hemidiaphragm. Heart size normal. Mild right base subsegmental atelectasis/ infiltrate cannot be excluded. No pleural effusion or pneumothorax. IMPRESSION: 1.  Lines  and tubes in good anatomic position. 2. Mild right base subsegmental atelectasis/infiltrate cannot be excluded. Electronically Signed   By: Maisie Fus  Register   On: 07/16/2017 14:43   STUDIES:  CT Head 07/19>>negative  Urine Drug 07/19>>+cocaine, amphetamines, cannabinoid, and tricyclic   CULTURES: Respiratory 07/19>>  ANTIBIOTICS: Zosyn 07/19 x1 dose in ER   SIGNIFICANT EVENTS: 07/19-Pt admitted to ICU   LINES/TUBES: ETT 07/19>>  ASSESSMENT / PLAN:  PULMONARY A: Mechanical Intubation for airway protection secondary to possible drug overdose  Hx: Asthma  P:   Full vent support for now  Prn ABG's and CXR Scheduled and prn bronchodilator therapy VAP bundle initiated    CARDIOVASCULAR A:  No acute issues  P:  Repeat EKG in am  Trend troponin's Continuous telemetry monitoring   RENAL A:   Mild Hypokalemia  Metabolic Alkalosis  P:   Trend BMP  Replace electrolytes as indicated  Monitor UOP   GASTROINTESTINAL A:   No acute issues  P:   SUP prophylaxis: IV protonix  Initiate TF protocol   HEMATOLOGIC A:   No acute issues  P:  Trend CBC  VTE prophylaxis: Subq Enoxaparin  Monitor for s/sx bleeding  Transfuse for hgb <7  INFECTIOUS A:   No acute issues  Hx: Recent scabies treatment  P:   Trend WBC and monitor fever curve Trend PCT Follow cultures If PCT's elevated will start empiric abx   ENDOCRINE A:   No acute issues   P:   Monitor serum glucose   NEUROLOGIC A:   Acute encephalopathy secondary to possible drug overdose Hx: Polysubstance Abuse, Schizoaffective Disorder, Depression, and Suicidal Attempt  P:   RASS goal: 0 to -1 Propofol gtt to maintain RASS goal  Psychiatry consulted appreciate input Suicidal precautions  Hold outpatient trazodone, aripiprazole, mirtazapine, and seroquel  Poison Control notified by ER   FAMILY  - Updates: No family at bedside to update 07/16/2017   Sonda Rumble, Juel Burrow   Pulmonary/Critical Care Pager [phone] (please enter 7 digits) PCCM Consult Pager [phone] (please enter 7 digits)

## 2017-07-16 NOTE — Plan of Care (Signed)
Problem: Safety: Goal: Ability to remain free from injury will improve Outcome: Progressing Pt will remain injury free. Safe environment provided.   

## 2017-07-16 NOTE — ED Triage Notes (Signed)
Pt arrives via EMS from home, per EMS pts family heard her scream that she was going to die and then they found her unresponsive, EMS reports giving 2 mg of narcan with no change, pill bottle of Seroquel and trazodone found, upon arrival pt responsive to painful stimuli

## 2017-07-16 NOTE — ED Notes (Signed)
Pt resting in bed, eyes closed, resp even and unlabored

## 2017-07-16 NOTE — ED Notes (Signed)
Pt responsive to painful stimuli, opens eyes when touched

## 2017-07-16 NOTE — ED Notes (Signed)
Patient transported to CT 

## 2017-07-16 NOTE — ED Provider Notes (Signed)
[facility] Emergency Department Provider Note       Time seen: ----------------------------------------- 10:30 AM on 07/16/2017 -----------------------------------------  Level V caveat: History/ROS limited by altered mental status   I have reviewed the triage vital signs and the nursing notes.   HISTORY   Chief Complaint Drug Overdose    HPI Bridget Carpenter is a 26 y.o. female who presents to the ED for possible overdose. Patient arrives via EMS from home. EMS reports the patient heard her scream that she was going to die and he found her unresponsive. She was given Narcan in route with no significant change. There were pill bottles of Seroquel and trazodone found but is unsure how much of the medications that she took. She cannot give any further review of systems or report.   Past Medical History:  Diagnosis Date  . Asthma   . Depression 2009   Inpatient psych admission for SI, dissociative fugue  . Dissociative disorder or reaction 2009  . Eczema   . H/O: suicide attempt   . ODD (oppositional defiant disorder)   . PTSD (post-traumatic stress disorder)   . Schizoaffective disorder ([place])   . Substance abuse     Patient Active Problem List   Diagnosis Date Noted  . Eczema 05/08/2017  . Borderline personality disorder 11/06/2016  . Cocaine use disorder, severe, dependence ([place]) 11/06/2016  . Cannabis use disorder, moderate, dependence ([place]) 11/06/2016  . Alcohol use disorder, mild, abuse 11/06/2016  . Major depressive disorder, recurrent severe without psychotic features ([place]) 11/06/2016  . Asthma 09/23/2011  . Tobacco use disorder 09/15/2009    Past Surgical History:  Procedure Laterality Date  . ADENOIDECTOMY    . TONSILLECTOMY      Allergies Banana  Social History Social History  Substance Use Topics  . Smoking status: Former Smoker    Packs/day: 0.50    Years: 2.00    Types: Cigarettes  . Smokeless tobacco: Never Used   . Alcohol use Yes     Comment: sometimes    Review of Systems Unknown at this time  All systems negative/normal/unremarkable except as stated in the HPI  ____________________________________________   PHYSICAL EXAM:  VITAL SIGNS: ED Triage Vitals  Enc Vitals Group     BP --      Pulse --      Resp --      Temp --      Temp src --      SpO2 07/16/17 1020 100 %     Weight 07/16/17 1023 115 lb (52.2 kg)     Height 07/16/17 1023 5' 5"  (1.651 m)     Head Circumference --      Peak Flow --      Pain Score 07/16/17 1021 0     Pain Loc --      Pain Edu? --      Excl. in [place]? --    Constitutional: Drowsy but responds to painful stimuli Eyes: Conjunctivae are normal. Disconjugate gaze ENT   Head: Normocephalic and atraumatic.   Nose: No congestion/rhinnorhea.   Mouth/Throat: Mucous membranes are moist. Strong gag reflex   Neck: No stridor. Cardiovascular: Normal rate, regular rhythm. No murmurs, rubs, or gallops. Respiratory: Normal respiratory effort without tachypnea nor retractions. Breath sounds are clear and equal bilaterally. No wheezes/rales/rhonchi. Gastrointestinal: Soft and nontender. Normal bowel sounds Musculoskeletal: Nontender with normal range of motion in extremities. No lower extremity tenderness nor edema. Neurologic:  Patient is confused and localizes to  pain. She is moving all of her extremities well. GCS is 11- E2, V4, M5 Skin:  Skin is warm, dry and intact. No rash noted. Psychiatric: Confused and lethargic ____________________________________________  EKG: Interpreted by me.Sinus rhythm rate 80 bpm, normal PR interval, normal QRS, normal QT.  ____________________________________________  ED COURSE:  Pertinent labs & imaging results that were available during my care of the patient were reviewed by me and considered in my medical decision making (see chart for details). Patient presents for possible overdose, we will assess with labs and  imaging as indicated. Clinical Course as of Jul 16 1410  Thu Jul 16, 2017  1046 Trazodone-drowsiness, check ekg Seroquel-drowsiness, dizziness Per Poison control  [JW]  0263 Tricyclic, Ur Screen: (!) POSITIVE [JW]  1146 Amphetamines, Ur Screen: (!) POSITIVE [JW]  1146 Cocaine Metabolite,Ur [place]: (!) POSITIVE [JW]  1146 Cannabinoid 50 Ng, Ur [place]: (!) POSITIVE [JW]  1356 We have been observing the patient without significant improvement in her mental status. Her GCS is currently around 9, no longer verbally responsive. We will intubate to protect her airway.  [JW]    Clinical Course User Index [JW] Earleen Newport, MD   Procedure Name: Intubation Date/Time: 07/16/2017 2:11 PM Performed by: Earleen Newport Pre-anesthesia Checklist: Patient identified Oxygen Delivery Method: Non-rebreather mask Preoxygenation: Pre-oxygenation with 100% oxygen Induction Type: Rapid sequence Laryngoscope Size: 4 and Mac Grade View: Grade II Tube size: 7.5 mm Number of attempts: 1 Airway Equipment and Method: Video-laryngoscopy Placement Confirmation: ETT inserted through vocal cords under direct vision Secured at: 22 cm Tube secured with: ETT holder Dental Injury: Teeth and Oropharynx as per pre-operative assessment  Difficulty Due To: Difficulty was unanticipated     ____________________________________________   LABS (pertinent positives/negatives)  Labs Reviewed  COMPREHENSIVE METABOLIC PANEL - Abnormal; Notable for the following:       Result Value   Potassium 3.4 (*)    Calcium 8.6 (*)    ALT 11 (*)    Total Bilirubin 1.4 (*)    All other components within normal limits  URINALYSIS, COMPLETE (UACMP) WITH MICROSCOPIC - Abnormal; Notable for the following:    Color, Urine YELLOW (*)    APPearance CLEAR (*)    Squamous Epithelial / LPF 0-5 (*)    All other components within normal limits  URINE DRUG SCREEN, QUALITATIVE ([facility] ONLY) - Abnormal; Notable for the following:     Tricyclic, Ur Screen POSITIVE (*)    Amphetamines, Ur Screen POSITIVE (*)    Cocaine Metabolite,Ur [place] POSITIVE (*)    Cannabinoid 50 Ng, Ur [place] POSITIVE (*)    All other components within normal limits  CBC WITH DIFFERENTIAL/PLATELET  TROPONIN I  ETHANOL  HCG, QUANTITATIVE, PREGNANCY  GLUCOSE, CAPILLARY  CBG MONITORING, ED  CRITICAL CARE Performed by: Earleen Newport   Total critical care time: 30 minutes  Critical care time was exclusive of separately billable procedures and treating other patients.  Critical care was necessary to treat or prevent imminent or life-threatening deterioration.  Critical care was time spent personally by me on the following activities: development of treatment plan with patient and/or surrogate as well as nursing, discussions with consultants, evaluation of patient's response to treatment, examination of patient, obtaining history from patient or surrogate, ordering and performing treatments and interventions, ordering and review of laboratory studies, ordering and review of radiographic studies, pulse oximetry and re-evaluation of patient's condition.   ____________________________________________  FINAL ASSESSMENT AND PLAN  Overdose, Polysubstance abuse  Plan: Patient's  labs and imaging were dictated above. Patient had presented for overdose and was subsequently intubated for airway protection. Her mental status continued to decline throughout her ER stay. She had multiple substances on board and I have discussed with poison control. I will discuss with the hospitalist for admission. She is requiring occasional medications for sedation   Earleen Newport, MD   Note: This note was generated in part or whole with voice recognition software. Voice recognition is usually quite accurate but there are transcription errors that can and very often do occur. I apologize for any typographical errors that were not detected and corrected.      Earleen Newport, MD 07/16/17 [phone]

## 2017-07-16 NOTE — H&P (Signed)
[facility] - [place] at [facility]   PATIENT NAME: Bridget Carpenter    MR#:  [id number]  DATE OF BIRTH:  October 18, 1991  DATE OF ADMISSION:  07/16/2017  PRIMARY CARE PHYSICIAN: [facility]   REQUESTING/REFERRING PHYSICIAN: Mayford Knife, MD  CHIEF COMPLAINT:   Chief Complaint  Patient presents with  . Drug Overdose    HISTORY OF PRESENT ILLNESS:  Bridget [place]  is a 26 y.o. female who presents with Overdose of Seroquel and trazodone. Urine tox screen here also shows multiple illicit substances in the patient's system. She has a history of polysubstance abuse. She required sedation for mechanical ventilation, and is unable to provide any more information to her history of present illness. However, she is well-known to psychiatry Department here in the hospital, and has had multiple prior hospitalizations for psychiatric problems. Hospitalists were called for admission  PAST MEDICAL HISTORY:   Past Medical History:  Diagnosis Date  . Asthma   . Depression 2009   Inpatient psych admission for SI, dissociative fugue  . Dissociative disorder or reaction 2009  . Eczema   . H/O: suicide attempt   . ODD (oppositional defiant disorder)   . PTSD (post-traumatic stress disorder)   . Schizoaffective disorder (HCC)   . Substance abuse     PAST SURGICAL HISTORY:   Past Surgical History:  Procedure Laterality Date  . ADENOIDECTOMY    . TONSILLECTOMY      SOCIAL HISTORY:   Social History  Substance Use Topics  . Smoking status: Former Smoker    Packs/day: 0.50    Years: 2.00    Types: Cigarettes  . Smokeless tobacco: Never Used  . Alcohol use Yes     Comment: sometimes    FAMILY HISTORY:   Family History  Problem Relation Age of Onset  . Adopted: Yes    DRUG ALLERGIES:   Allergies  Allergen Reactions  . Banana Hives    MEDICATIONS AT HOME:   Prior to Admission medications   Medication Sig Start Date End Date Taking?  Authorizing Provider  albuterol (PROVENTIL HFA;VENTOLIN HFA) 108 (90 Base) MCG/ACT inhaler Inhale 1-2 puffs into the lungs every 4 (four) hours as needed for wheezing or shortness of breath. 05/11/17  Yes Jimmy Footman, MD  cephALEXin (KEFLEX) 500 MG capsule Take 500 mg by mouth 3 (three) times daily.  07/13/17 07/27/17 Yes [provider]  clobetasol ointment (TEMOVATE) 0.05 % Apply 1 application topically 2 (two) times daily as needed. 06/18/17  Yes [provider]  traZODone (DESYREL) 100 MG tablet Take 100 mg by mouth at bedtime as needed for sleep.   Yes [provider]  triamcinolone cream (KENALOG) 0.1 % Apply topically 2 (two) times daily. 05/11/17  Yes Jimmy Footman, MD  ARIPiprazole (ABILIFY) 2 MG tablet Take 1 tablet (2 mg total) by mouth at bedtime. Patient not taking: Reported on 07/16/2017 05/11/17   Jimmy Footman, MD  mirtazapine (REMERON) 15 MG tablet Take 1 tablet (15 mg total) by mouth at bedtime. Patient not taking: Reported on 07/16/2017 05/11/17   Jimmy Footman, MD  QUEtiapine Fumarate (SEROQUEL XR) 150 MG 24 hr tablet Take 150 mg by mouth at bedtime.    [provider]    REVIEW OF SYSTEMS:  Review of Systems  Unable to perform ROS: Critical illness     VITAL SIGNS:   Vitals:   07/16/17 1200 07/16/17 1230 07/16/17 1300 07/16/17 1330  BP: 114/76 110/79 113/87 122/81  Pulse: 76 86  90 99  Resp: 12 14 18 20   SpO2: 100% 95% 96% 99%  Weight:      Height:       Wt Readings from Last 3 Encounters:  07/16/17 52.2 kg (115 lb)  07/12/17 52.2 kg (115 lb)  07/11/17 52.2 kg (115 lb)    PHYSICAL EXAMINATION:  Physical Exam  Vitals reviewed. Constitutional: She appears well-developed and well-nourished.  HENT:  Head: Normocephalic and atraumatic.  Mouth/Throat: Oropharynx is clear and moist.  Eyes: Pupils are equal, round, and reactive to light. Conjunctivae and EOM are normal. No scleral  icterus.  Neck: Normal range of motion. Neck supple. No JVD present. No thyromegaly present.  Cardiovascular: Normal rate, regular rhythm and intact distal pulses.  Exam reveals no gallop and no friction rub.   No murmur heard. Respiratory: No respiratory distress (Intubated and mechanically ventilated). She has no wheezes. She has no rales.  Right-sided rhonchi  GI: Soft. Bowel sounds are normal. She exhibits no distension. There is no tenderness.  Musculoskeletal: Normal range of motion. She exhibits no edema.  No arthritis, no gout  Lymphadenopathy:    She has no cervical adenopathy.  Neurological:  Unable to assess due to sedation from mechanical ventilation  Skin: Skin is warm and dry. No rash noted. No erythema.  Psychiatric:  Unable to assess due to patient condition    LABORATORY PANEL:   CBC  Recent Labs Lab 07/16/17 1025  WBC 6.7  HGB 12.1  HCT 36.0  PLT 205   ------------------------------------------------------------------------------------------------------------------  Chemistries   Recent Labs Lab 07/16/17 1025  NA 145  K 3.4*  CL 111  CO2 25  GLUCOSE 92  BUN 15  CREATININE 0.75  CALCIUM 8.6*  AST 22  ALT 11*  ALKPHOS 48  BILITOT 1.4*   ------------------------------------------------------------------------------------------------------------------  Cardiac Enzymes  Recent Labs Lab 07/16/17 1025  TROPONINI <0.03   ------------------------------------------------------------------------------------------------------------------  RADIOLOGY:  Dg Chest 1 View  Result Date: 07/16/2017 CLINICAL DATA:  Altered mental status.  Dyspnea. EXAM: CHEST 1 VIEW COMPARISON:  03/13/2017 FINDINGS: Multiple telemetry leads overlie the chest. The cardiomediastinal silhouette is within normal limits. The lungs are less well inflated than on the prior study, and there is minimally increased infrahilar density in the medial right lung base compared to the  prior study. The lungs are otherwise clear. No pleural effusion or pneumothorax is identified. No acute osseous abnormality is seen. IMPRESSION: Minimal right infrahilar opacity, likely atelectasis though aspiration is not excluded. Electronically Signed   By: Sebastian AcheAllen  Grady M.D.   On: 07/16/2017 13:54   Ct Head Wo Contrast  Result Date: 07/16/2017 CLINICAL DATA:  Altered mental status/nonresponsive EXAM: CT HEAD WITHOUT CONTRAST TECHNIQUE: Contiguous axial images were obtained from the base of the skull through the vertex without intravenous contrast. COMPARISON:  October 22, 2016 FINDINGS: Brain: The ventricles are normal in size and configuration. There is no intracranial mass, hemorrhage, extra-axial fluid collection, or midline shift. Gray-white compartments are normal. No evident acute infarct. Vascular: No hyperdense vessel. No vascular calcifications are evident. Skull: The bony calvarium appears intact. Sinuses/Orbits: Visualized paranasal sinuses are clear. Visualized orbits appear symmetric bilaterally. Other: Visualized mastoid air cells are clear. IMPRESSION: Study within normal limits. Electronically Signed   By: Bretta BangWilliam  Woodruff III M.D.   On: 07/16/2017 12:01    EKG:   Orders placed or performed during the hospital encounter of 07/16/17  . EKG 12-Lead  . EKG 12-Lead  . EKG 12-Lead  . EKG 12-Lead  .  EKG 12-Lead  . EKG 12-Lead    IMPRESSION AND PLAN:  Principal Problem:   Overdose - overdosed on Seroquel and Trazodone, unclear what other illicit substances she may have used recently. Though her tox screen is positive for various substances, temporal proximity to the overdose of Seroquel and trazodone is uncertain. Active Problems:   Aspiration pneumonia (HCC) - patient is intubated, we will keep her on broad-spectrum IV antibiotics   Acute respiratory failure (HCC) - due to her overdose and aspiration pneumonia. She is currently intubated and mechanically ventilated    Polysubstance abuse - seen on urine tox screen, unclear if any of those substances may also be laying a role in her current condition   Borderline personality disorder - psychiatry consult   Major depressive disorder, recurrent severe without psychotic features [facility]) - psychiatry consult, unclear of her overdose was intentional as she is not able to relate to [place] for intentions at this time.  All the records are reviewed and case discussed with ED provider. Management plans discussed with the patient and/or family.  DVT PROPHYLAXIS: SubQ lovenox  GI PROPHYLAXIS: None  ADMISSION STATUS: Inpatient  CODE STATUS: Full Code Status History    Date Active Date Inactive Code Status Order ID Comments User Context   05/07/2017  3:48 PM 05/11/2017  9:06 PM Full Code [id number]  Audery Amel, MD Inpatient   11/05/2016  4:31 PM 11/12/2016  2:25 PM Full Code [id number]  Audery Amel, MD Inpatient   08/13/2016  8:07 PM 08/18/2016  4:21 PM Full Code [id number]  Audery Amel, MD Inpatient   07/21/2015 12:59 PM 07/21/2015  4:36 PM Full Code [id number]  Clapacs, [name], MD Inpatient      TOTAL CRITICAL CARE TIME TAKING CARE OF THIS PATIENT: 50 minutes.   Christerpher Clos FIELDING 07/16/2017, 2:36 PM  [employer]  [phone]  CC: Primary care physician; [facility]  Note:  This document was prepared using [employer] software and may include unintentional dictation errors.

## 2017-07-16 NOTE — Plan of Care (Signed)
Problem: Skin Integrity: Goal: Risk for impaired skin integrity will decrease Outcome: Progressing Pt repositioned every 2 hrs. Skin is intact

## 2017-07-16 NOTE — ED Notes (Signed)
Pt returned from CT, resting in bed, bed alarm placed on pt

## 2017-07-16 NOTE — ED Notes (Signed)
Pt only responsive to painful stimuli with gargling resp and mucous, EDP brought to bedside with decision to intubate, RT called to bedside

## 2017-07-16 NOTE — ED Notes (Signed)
Pt placed on 2 L [place], o2 sat 89% on RA

## 2017-07-16 NOTE — Consult Note (Signed)
  Psychiatry: Patient seen. Spoke with emergency room physician. Reviewed chart. 26 year old woman with a history of mental illness and substance abuse well known to the psychiatry service. Presented to the emergency room today currently unconscious possible overdose. Patient was not arousable despite my attempts to shake or gently and speak her name loudly. Presumably she will need psychiatric consult want she awakens. No specific treatment or medication to be added at this point. We will follow-up as needed.

## 2017-07-16 NOTE — Progress Notes (Signed)
OG tube placed in ED, Abdominal xray showed tube was in stomach but recommended advancing it 5-8 cm.  Upon arrival tube was around 52 cm, so tube advanced to 60 cm at lip.

## 2017-07-17 DIAGNOSIS — T50902D Poisoning by unspecified drugs, medicaments and biological substances, intentional self-harm, subsequent encounter: Secondary | ICD-10-CM

## 2017-07-17 DIAGNOSIS — J9601 Acute respiratory failure with hypoxia: Secondary | ICD-10-CM

## 2017-07-17 DIAGNOSIS — F1910 Other psychoactive substance abuse, uncomplicated: Secondary | ICD-10-CM

## 2017-07-17 DIAGNOSIS — F332 Major depressive disorder, recurrent severe without psychotic features: Secondary | ICD-10-CM

## 2017-07-17 DIAGNOSIS — F603 Borderline personality disorder: Secondary | ICD-10-CM

## 2017-07-17 DIAGNOSIS — T50901A Poisoning by unspecified drugs, medicaments and biological substances, accidental (unintentional), initial encounter: Secondary | ICD-10-CM

## 2017-07-17 LAB — ACETAMINOPHEN LEVEL

## 2017-07-17 LAB — BASIC METABOLIC PANEL
ANION GAP: 7 (ref 5–15)
BUN: 12 mg/dL (ref 6–20)
CALCIUM: 8.8 mg/dL — AB (ref 8.9–10.3)
CO2: 26 mmol/L (ref 22–32)
CREATININE: 0.71 mg/dL (ref 0.44–1.00)
Chloride: 111 mmol/L (ref 101–111)
Glucose, Bld: 108 mg/dL — ABNORMAL HIGH (ref 65–99)
Potassium: 3.6 mmol/L (ref 3.5–5.1)
Sodium: 144 mmol/L (ref 135–145)

## 2017-07-17 LAB — PROCALCITONIN

## 2017-07-17 LAB — CBC WITH DIFFERENTIAL/PLATELET
BASOS ABS: 0 10*3/uL (ref 0–0.1)
BASOS PCT: 0 %
EOS ABS: 0.2 10*3/uL (ref 0–0.7)
Eosinophils Relative: 1 %
HEMATOCRIT: 38.3 % (ref 35.0–47.0)
Hemoglobin: 12.4 g/dL (ref 12.0–16.0)
Lymphocytes Relative: 9 %
Lymphs Abs: 1.1 10*3/uL (ref 1.0–3.6)
MCH: 30.4 pg (ref 26.0–34.0)
MCHC: 32.4 g/dL (ref 32.0–36.0)
MCV: 93.9 fL (ref 80.0–100.0)
MONO ABS: 0.8 10*3/uL (ref 0.2–0.9)
Monocytes Relative: 7 %
NEUTROS ABS: 9.6 10*3/uL — AB (ref 1.4–6.5)
NEUTROS PCT: 83 %
Platelets: 234 10*3/uL (ref 150–440)
RBC: 4.08 MIL/uL (ref 3.80–5.20)
RDW: 13.9 % (ref 11.5–14.5)
WBC: 11.6 10*3/uL — ABNORMAL HIGH (ref 3.6–11.0)

## 2017-07-17 LAB — SALICYLATE LEVEL

## 2017-07-17 LAB — GLUCOSE, CAPILLARY
GLUCOSE-CAPILLARY: 115 mg/dL — AB (ref 65–99)
Glucose-Capillary: 79 mg/dL (ref 65–99)

## 2017-07-17 MED ORDER — LEVOFLOXACIN IN D5W 500 MG/100ML IV SOLN
500.0000 mg | INTRAVENOUS | Status: DC
  Administered 2017-07-17: 500 mg via INTRAVENOUS
  Filled 2017-07-17 (×2): qty 100

## 2017-07-17 MED ORDER — LEVOFLOXACIN 500 MG PO TABS: 500.0000 mg | ORAL_TABLET | Freq: Every day | ORAL | Status: DC

## 2017-07-17 NOTE — Progress Notes (Signed)
Report given to Silvio PateSherry Campbell NT for 1:1

## 2017-07-17 NOTE — Progress Notes (Addendum)
Verbal order from Dr. Belia HemanKasa received to transfer patient to med surg no tele and advance diet as tolerated. Orders placed. Trudee KusterBrandi R [place]

## 2017-07-17 NOTE — Consult Note (Signed)
[facility] Consult   Reason for Consult:  Consult for 26 year old woman with a history of mental health problems and substance abuse who came to the hospital after intentionally overdosing Referring Physician:  Posey Pronto Patient Identification: Bridget Carpenter MRN:  [id number] Principal Diagnosis: Overdose Diagnosis:   Patient Active Problem List   Diagnosis Date Noted  . Overdose [T50.901A] 07/16/2017  . Aspiration pneumonia ([place]) [J69.0] 07/16/2017  . Acute respiratory failure ([place]) [J96.00] 07/16/2017  . Polysubstance abuse [F19.10] 07/16/2017  . Eczema [L30.9] 05/08/2017  . Borderline personality disorder [F60.3] 11/06/2016  . Cocaine use disorder, severe, dependence ([place]) [F14.20] 11/06/2016  . Cannabis use disorder, moderate, dependence ([place]) [F12.20] 11/06/2016  . Alcohol use disorder, mild, abuse [F10.10] 11/06/2016  . Major depressive disorder, recurrent severe without psychotic features ([place]) [F33.2] 11/06/2016  . Asthma [J45.909] 09/23/2011  . Tobacco use disorder [F17.200] 09/15/2009    Total Time spent with patient: 30 minutes  Subjective:   Bridget Carpenter is a 26 y.o. female patient admitted with patient not able to give any information.  HPI:  Patient remains unresponsive. I got her to moan slightly and open her eyes by saying her name several times but she was not able to engage in a conversation. Immediately fell back asleep. She has been medically cleared to leave the intensive care unit and is now in a regular medical bed.  Past Psychiatric History: History of multiple prior overdoses history of substance abuse and mental health problems and behavior problems  Risk to Self: Is patient at risk for suicide?: Yes Risk to Others:   Prior Inpatient Therapy:   Prior Outpatient Therapy:    Past Medical History:  Past Medical History:  Diagnosis Date  . Asthma   . Depression 2009   Inpatient psych admission for SI, dissociative fugue  .  Dissociative disorder or reaction 2009  . Eczema   . H/O: suicide attempt   . ODD (oppositional defiant disorder)   . PTSD (post-traumatic stress disorder)   . Schizoaffective disorder ([place])   . Substance abuse     Past Surgical History:  Procedure Laterality Date  . ADENOIDECTOMY    . TONSILLECTOMY     Family History:  Family History  Problem Relation Age of Onset  . Adopted: Yes   Family Psychiatric  History: Unknown Social History:  History  Alcohol Use  . Yes    Comment: sometimes     History  Drug Use No    Social History   Social History  . Marital status: Single    Spouse name: N/A  . Number of children: N/A  . Years of education: N/A   Social History Main Topics  . Smoking status: Former Smoker    Packs/day: 0.50    Years: 2.00    Types: Cigarettes  . Smokeless tobacco: Never Used  . Alcohol use Yes     Comment: sometimes  . Drug use: No  . Sexual activity: Yes   Other Topics Concern  . None   Social History Narrative  . None   Additional Social History:    Allergies:   Allergies  Allergen Reactions  . Banana Hives    Labs:  Results for orders placed or performed during the hospital encounter of 07/16/17 (from the past 48 hour(s))  CBC with Differential     Status: None   Collection Time: 07/16/17 10:25 AM  Result Value Ref Range   WBC 6.7 3.6 - 11.0 K/uL   RBC 3.84  3.80 - 5.20 MIL/uL   Hemoglobin 12.1 12.0 - 16.0 g/dL   HCT 36.0 35.0 - 47.0 %   MCV 93.8 80.0 - 100.0 fL   MCH 31.6 26.0 - 34.0 pg   MCHC 33.7 32.0 - 36.0 g/dL   RDW 13.5 11.5 - 14.5 %   Platelets 205 150 - 440 K/uL    Comment: COUNT MAY BE INACCURATE DUE TO FIBRIN CLUMPS.   Neutrophils Relative % 57 %   Neutro Abs 3.9 1.4 - 6.5 K/uL   Lymphocytes Relative 27 %   Lymphs Abs 1.8 1.0 - 3.6 K/uL   Monocytes Relative 11 %   Monocytes Absolute 0.7 0.2 - 0.9 K/uL   Eosinophils Relative 4 %   Eosinophils Absolute 0.2 0 - 0.7 K/uL   Basophils Relative 1 %   Basophils  Absolute 0.0 0 - 0.1 K/uL  Comprehensive metabolic panel     Status: Abnormal   Collection Time: 07/16/17 10:25 AM  Result Value Ref Range   Sodium 145 135 - 145 mmol/L   Potassium 3.4 (L) 3.5 - 5.1 mmol/L   Chloride 111 101 - 111 mmol/L   CO2 25 22 - 32 mmol/L   Glucose, Bld 92 65 - 99 mg/dL   BUN 15 6 - 20 mg/dL   Creatinine, Ser 0.75 0.44 - 1.00 mg/dL   Calcium 8.6 (L) 8.9 - 10.3 mg/dL   Total Protein 7.0 6.5 - 8.1 g/dL   Albumin 3.7 3.5 - 5.0 g/dL   AST 22 15 - 41 U/L   ALT 11 (L) 14 - 54 U/L   Alkaline Phosphatase 48 38 - 126 U/L   Total Bilirubin 1.4 (H) 0.3 - 1.2 mg/dL   GFR calc non Af Amer >60 >60 mL/min   GFR calc Af Amer >60 >60 mL/min    Comment: (NOTE) The eGFR has been calculated using the CKD EPI equation. This calculation has not been validated in all clinical situations. eGFR's persistently <60 mL/min signify possible Chronic Kidney Disease.    Anion gap 9 5 - 15  Troponin I     Status: None   Collection Time: 07/16/17 10:25 AM  Result Value Ref Range   Troponin I <0.03 <0.03 ng/mL  Urinalysis, Complete w Microscopic     Status: Abnormal   Collection Time: 07/16/17 10:25 AM  Result Value Ref Range   Color, Urine YELLOW (A) YELLOW   APPearance CLEAR (A) CLEAR   Specific Gravity, Urine 1.015 1.005 - 1.030   pH 6.0 5.0 - 8.0   Glucose, UA NEGATIVE NEGATIVE mg/dL   Hgb urine dipstick NEGATIVE NEGATIVE   Bilirubin Urine NEGATIVE NEGATIVE   Ketones, ur NEGATIVE NEGATIVE mg/dL   Protein, ur NEGATIVE NEGATIVE mg/dL   Nitrite NEGATIVE NEGATIVE   Leukocytes, UA NEGATIVE NEGATIVE   RBC / HPF 0-5 0 - 5 RBC/hpf   WBC, UA 0-5 0 - 5 WBC/hpf   Bacteria, UA NONE SEEN NONE SEEN   Squamous Epithelial / LPF 0-5 (A) NONE SEEN   Mucous PRESENT   Ethanol     Status: None   Collection Time: 07/16/17 10:25 AM  Result Value Ref Range   Alcohol, Ethyl (B) <5 <5 mg/dL    Comment:        LOWEST DETECTABLE LIMIT FOR SERUM ALCOHOL IS 5 mg/dL FOR MEDICAL PURPOSES ONLY    Urine Drug Screen, Qualitative ([facility] only)     Status: Abnormal   Collection Time: 07/16/17 10:25 AM  Result Value Ref Range  Tricyclic, Ur Screen POSITIVE (A) NONE DETECTED   Amphetamines, Ur Screen POSITIVE (A) NONE DETECTED   MDMA (Ecstasy)Ur Screen NONE DETECTED NONE DETECTED   Cocaine Metabolite,Ur [place] POSITIVE (A) NONE DETECTED   Opiate, Ur Screen NONE DETECTED NONE DETECTED   Phencyclidine (PCP) Ur S NONE DETECTED NONE DETECTED   Cannabinoid 50 Ng, Ur [place] POSITIVE (A) NONE DETECTED   Barbiturates, Ur Screen NONE DETECTED NONE DETECTED   Benzodiazepine, Ur Scrn NONE DETECTED NONE DETECTED   Methadone Scn, Ur NONE DETECTED NONE DETECTED    Comment: (NOTE) 947  Tricyclics, urine               Cutoff 1000 ng/mL 200  Amphetamines, urine             Cutoff 1000 ng/mL 300  MDMA (Ecstasy), urine           Cutoff 500 ng/mL 400  Cocaine Metabolite, urine       Cutoff 300 ng/mL 500  Opiate, urine                   Cutoff 300 ng/mL 600  Phencyclidine (PCP), urine      Cutoff 25 ng/mL 700  Cannabinoid, urine              Cutoff 50 ng/mL 800  Barbiturates, urine             Cutoff 200 ng/mL 900  Benzodiazepine, urine           Cutoff 200 ng/mL 1000 Methadone, urine                Cutoff 300 ng/mL 1100 1200 The urine drug screen provides only a preliminary, unconfirmed 1300 analytical test result and should not be used for non-medical 1400 purposes. Clinical consideration and professional judgment should 1500 be applied to any positive drug screen result due to possible 1600 interfering substances. A more specific alternate chemical method 1700 must be used in order to obtain a confirmed analytical result.  1800 Gas chromato graphy / mass spectrometry (GC/MS) is the preferred 1900 confirmatory method.   hCG, quantitative, pregnancy     Status: None   Collection Time: 07/16/17 10:25 AM  Result Value Ref Range   hCG, Beta Chain, Quant, S <1 <5 mIU/mL    Comment:          GEST. AGE       CONC.  (mIU/mL)   <=1 WEEK        5 - 50     2 WEEKS       50 - 500     3 WEEKS       100 - 10,000     4 WEEKS     1,000 - 30,000     5 WEEKS     3,500 - 115,000   6-8 WEEKS     12,000 - 270,000    12 WEEKS     15,000 - 220,000        FEMALE AND NON-PREGNANT FEMALE:     LESS THAN 5 mIU/mL   Glucose, capillary     Status: None   Collection Time: 07/16/17 11:03 AM  Result Value Ref Range   Glucose-Capillary 88 65 - 99 mg/dL  Blood gas, arterial     Status: Abnormal   Collection Time: 07/16/17  3:23 PM  Result Value Ref Range   FIO2 0.40    Mode ASSIST CONTROL    VT 450 mL  LHR 16 resp/min   Peep/cpap 5.0 cm H20   pH, Arterial 7.51 (H) 7.350 - 7.450   pCO2 arterial 34 32.0 - 48.0 mmHg   pO2, Arterial 89 83.0 - 108.0 mmHg   Bicarbonate 27.1 20.0 - 28.0 mmol/L   Acid-Base Excess 4.2 (H) 0.0 - 2.0 mmol/L   O2 Saturation 97.7 %   Patient temperature 37.0    Collection site RIGHT RADIAL    Sample type ARTERIAL DRAW    Allens test (pass/fail) PASS PASS  Culture, respiratory (NON-Expectorated)     Status: None (Preliminary result)   Collection Time: 07/16/17  4:12 PM  Result Value Ref Range   Specimen Description TRACHEAL ASPIRATE    Special Requests NONE    Gram Stain      ABUNDANT WBC PRESENT,BOTH PMN AND MONONUCLEAR FEW GRAM POSITIVE COCCI IN PAIRS RARE GRAM NEGATIVE COCCI IN PAIRS RARE GRAM NEGATIVE RODS    Culture      TOO YOUNG TO READ Performed at [facility], [address], [place], [place] [address]    Report Status PENDING   MRSA PCR Screening     Status: Abnormal   Collection Time: 07/16/17  4:24 PM  Result Value Ref Range   MRSA by PCR POSITIVE (A) NEGATIVE    Comment:        The GeneXpert MRSA Assay (FDA approved for NASAL specimens only), is one component of a comprehensive MRSA colonization surveillance program. It is not intended to diagnose MRSA infection nor to guide or monitor treatment for MRSA infections. CRITICAL RESULT CALLED TO,  READ BACK BY AND VERIFIED WITH: JEREMIAH KEENE AT 1800 ON 07/16/2017 JJB   Magnesium     Status: None   Collection Time: 07/16/17  4:41 PM  Result Value Ref Range   Magnesium 2.3 1.7 - 2.4 mg/dL  Phosphorus     Status: None   Collection Time: 07/16/17  4:41 PM  Result Value Ref Range   Phosphorus 3.4 2.5 - 4.6 mg/dL  Triglycerides     Status: None   Collection Time: 07/16/17  4:41 PM  Result Value Ref Range   Triglycerides 42 <150 mg/dL  Procalcitonin - Baseline     Status: None   Collection Time: 07/16/17  4:41 PM  Result Value Ref Range   Procalcitonin <0.10 ng/mL    Comment:        Interpretation: PCT (Procalcitonin) <= 0.5 ng/mL: Systemic infection (sepsis) is not likely. Local bacterial infection is possible. (NOTE)         ICU PCT Algorithm               Non ICU PCT Algorithm    ----------------------------     ------------------------------         PCT < 0.25 ng/mL                 PCT < 0.1 ng/mL     Stopping of antibiotics            Stopping of antibiotics       strongly encouraged.               strongly encouraged.    ----------------------------     ------------------------------       PCT level decrease by               PCT < 0.25 ng/mL       >= 80% from peak PCT       OR PCT  0.25 - 0.5 ng/mL          Stopping of antibiotics                                             encouraged.     Stopping of antibiotics           encouraged.    ----------------------------     ------------------------------       PCT level decrease by              PCT >= 0.25 ng/mL       < 80% from peak PCT        AND PCT >= 0.5 ng/mL            Continuin g antibiotics                                              encouraged.       Continuing antibiotics            encouraged.    ----------------------------     ------------------------------     PCT level increase compared          PCT > 0.5 ng/mL         with peak PCT AND          PCT >= 0.5 ng/mL             Escalation of  antibiotics                                          strongly encouraged.      Escalation of antibiotics        strongly encouraged.   CBC     Status: Abnormal   Collection Time: 07/16/17  4:41 PM  Result Value Ref Range   WBC 11.7 (H) 3.6 - 11.0 K/uL   RBC 4.37 3.80 - 5.20 MIL/uL   Hemoglobin 13.3 12.0 - 16.0 g/dL   HCT 41.1 35.0 - 47.0 %   MCV 94.1 80.0 - 100.0 fL   MCH 30.5 26.0 - 34.0 pg   MCHC 32.5 32.0 - 36.0 g/dL   RDW 14.0 11.5 - 14.5 %   Platelets 215 150 - 440 K/uL  Creatinine, serum     Status: None   Collection Time: 07/16/17  4:41 PM  Result Value Ref Range   Creatinine, Ser 0.77 0.44 - 1.00 mg/dL   GFR calc non Af Amer >60 >60 mL/min   GFR calc Af Amer >60 >60 mL/min    Comment: (NOTE) The eGFR has been calculated using the CKD EPI equation. This calculation has not been validated in all clinical situations. eGFR's persistently <60 mL/min signify possible Chronic Kidney Disease.   Glucose, capillary     Status: None   Collection Time: 07/16/17 11:38 PM  Result Value Ref Range   Glucose-Capillary 77 65 - 99 mg/dL   Comment 1 Notify RN    Comment 2 Document in Chart   Acetaminophen level     Status: Abnormal   Collection Time: 07/17/17  3:23 AM  Result Value Ref Range   Acetaminophen (Tylenol), Serum <  10 (L) 10 - 30 ug/mL    Comment:        THERAPEUTIC CONCENTRATIONS VARY SIGNIFICANTLY. A RANGE OF 10-30 ug/mL MAY BE AN EFFECTIVE CONCENTRATION FOR MANY PATIENTS. HOWEVER, SOME ARE BEST TREATED AT CONCENTRATIONS OUTSIDE THIS RANGE. ACETAMINOPHEN CONCENTRATIONS >150 ug/mL AT 4 HOURS AFTER INGESTION AND >50 ug/mL AT 12 HOURS AFTER INGESTION ARE OFTEN ASSOCIATED WITH TOXIC REACTIONS.   Salicylate level     Status: None   Collection Time: 07/17/17  3:23 AM  Result Value Ref Range   Salicylate Lvl <9.7 2.8 - 30.0 mg/dL  Basic metabolic panel     Status: Abnormal   Collection Time: 07/17/17  3:23 AM  Result Value Ref Range   Sodium 144 135 - 145 mmol/L    Potassium 3.6 3.5 - 5.1 mmol/L   Chloride 111 101 - 111 mmol/L   CO2 26 22 - 32 mmol/L   Glucose, Bld 108 (H) 65 - 99 mg/dL   BUN 12 6 - 20 mg/dL   Creatinine, Ser 0.71 0.44 - 1.00 mg/dL   Calcium 8.8 (L) 8.9 - 10.3 mg/dL   GFR calc non Af Amer >60 >60 mL/min   GFR calc Af Amer >60 >60 mL/min    Comment: (NOTE) The eGFR has been calculated using the CKD EPI equation. This calculation has not been validated in all clinical situations. eGFR's persistently <60 mL/min signify possible Chronic Kidney Disease.    Anion gap 7 5 - 15  CBC with Differential/Platelet     Status: Abnormal   Collection Time: 07/17/17  3:23 AM  Result Value Ref Range   WBC 11.6 (H) 3.6 - 11.0 K/uL   RBC 4.08 3.80 - 5.20 MIL/uL   Hemoglobin 12.4 12.0 - 16.0 g/dL   HCT 38.3 35.0 - 47.0 %   MCV 93.9 80.0 - 100.0 fL   MCH 30.4 26.0 - 34.0 pg   MCHC 32.4 32.0 - 36.0 g/dL   RDW 13.9 11.5 - 14.5 %   Platelets 234 150 - 440 K/uL   Neutrophils Relative % 83 %   Neutro Abs 9.6 (H) 1.4 - 6.5 K/uL   Lymphocytes Relative 9 %   Lymphs Abs 1.1 1.0 - 3.6 K/uL   Monocytes Relative 7 %   Monocytes Absolute 0.8 0.2 - 0.9 K/uL   Eosinophils Relative 1 %   Eosinophils Absolute 0.2 0 - 0.7 K/uL   Basophils Relative 0 %   Basophils Absolute 0.0 0 - 0.1 K/uL  Procalcitonin     Status: None   Collection Time: 07/17/17  3:23 AM  Result Value Ref Range   Procalcitonin <0.10 ng/mL    Comment:        Interpretation: PCT (Procalcitonin) <= 0.5 ng/mL: Systemic infection (sepsis) is not likely. Local bacterial infection is possible. (NOTE)         ICU PCT Algorithm               Non ICU PCT Algorithm    ----------------------------     ------------------------------         PCT < 0.25 ng/mL                 PCT < 0.1 ng/mL     Stopping of antibiotics            Stopping of antibiotics       strongly encouraged.               strongly encouraged.    ----------------------------     ------------------------------  PCT  level decrease by               PCT < 0.25 ng/mL       >= 80% from peak PCT       OR PCT 0.25 - 0.5 ng/mL          Stopping of antibiotics                                             encouraged.     Stopping of antibiotics           encouraged.    ----------------------------     ------------------------------       PCT level decrease by              PCT >= 0.25 ng/mL       < 80% from peak PCT        AND PCT >= 0.5 ng/mL            Continuin g antibiotics                                              encouraged.       Continuing antibiotics            encouraged.    ----------------------------     ------------------------------     PCT level increase compared          PCT > 0.5 ng/mL         with peak PCT AND          PCT >= 0.5 ng/mL             Escalation of antibiotics                                          strongly encouraged.      Escalation of antibiotics        strongly encouraged.   Glucose, capillary     Status: Abnormal   Collection Time: 07/17/17  7:33 AM  Result Value Ref Range   Glucose-Capillary 115 (H) 65 - 99 mg/dL   Comment 1 Notify RN     Current Facility-Administered Medications  Medication Dose Route Frequency Provider Last Rate Last Dose  . acetaminophen (TYLENOL) tablet 650 mg  650 mg Oral Q6H PRN Lance Coon, MD       Or  . acetaminophen (TYLENOL) suppository 650 mg  650 mg Rectal Q6H PRN Lance Coon, MD      . Chlorhexidine Gluconate Cloth 2 % PADS 6 each  6 each Topical Q0600 Awilda Bill, NP   6 each at 07/17/17 0600  . enoxaparin (LOVENOX) injection 40 mg  40 mg Subcutaneous Q24H Lance Coon, MD   40 mg at 07/16/17 1731  . levofloxacin (LEVAQUIN) IVPB 500 mg  500 mg Intravenous Q24H Dustin Flock, MD      . mupirocin ointment (BACTROBAN) 2 % 1 application  1 application Nasal BID Awilda Bill, NP   1 application at 50/27/74 1107  . ondansetron (ZOFRAN) tablet 4 mg  4 mg Oral Q6H PRN Lance Coon, MD  Or  . ondansetron (ZOFRAN)  injection 4 mg  4 mg Intravenous Q6H PRN Lance Coon, MD        Musculoskeletal: Strength & Muscle Tone: within normal limits Gait & Station: unable to stand Patient leans: N/A  Psychiatric Specialty Exam: Physical Exam  Nursing note and vitals reviewed. Constitutional: She appears well-developed and well-nourished.  HENT:  Head: Normocephalic and atraumatic.  Eyes: Pupils are equal, round, and reactive to light. Conjunctivae are normal.  Neck: Normal range of motion.  Cardiovascular: Normal heart sounds.   Respiratory: Effort normal.  GI: Soft.  Musculoskeletal: Normal range of motion.  Neurological: She is alert.  Skin: Skin is warm and dry.  Psychiatric: Her affect is not blunt. Her speech is not delayed. She is not withdrawn.    Review of Systems  Unable to perform ROS: Patient nonverbal    Blood pressure 125/78, pulse 92, temperature 98 F (36.7 C), temperature source Oral, resp. rate (!) 22, height _0  (1.676 m), weight 121 lb 11.1 oz (55.2 kg), last menstrual period 06/26/2017, SpO2 100 %.Body mass index is 19.64 kg/m.  General Appearance: Disheveled  Eye Contact:  Poor  Speech:  Garbled and Slow  Volume:  Decreased  Mood:  Negative  Affect:  Negative  Thought Process:  Disorganized  Orientation:  Negative  Thought Content:  Negative  Suicidal Thoughts:  Yes.  with intent/plan  Homicidal Thoughts:  No  Memory:  Negative  Judgement:  Negative  Insight:  Negative  Psychomotor Activity:  Negative  Concentration:  Concentration: Negative  Recall:  Negative  Fund of Knowledge:  Negative  Language:  Negative  Akathisia:  Negative  Handed:  Right  AIMS (if indicated):     Assets:  Resilience  ADL's:  Impaired  Cognition:  Impaired,  Severe  Sleep:        Treatment Plan Summary: Plan 26 year old woman who remains obtunded from overdose of multiple medicines. Vitals stabilizing. No longer critically ill but not responsive. Patient should continue to have  a sitter and be on suicide precautions. I will sign out her presence here and she can be followed up over the weekend.  Disposition: Supportive therapy provided about ongoing stressors.  Alethia Berthold, MD 07/17/2017 4:31 PM

## 2017-07-17 NOTE — Consult Note (Signed)
PULMONARY / CRITICAL CARE MEDICINE   Name: Bridget Carpenter MRN: [id number] DOB: [birth year]    ADMISSION DATE:  07/16/2017 CONSULTATION DATE: 07/16/2017  REFERRING MD:  Dr. Anne Hahn   CHIEF COMPLAINT: Possible Drug Overdose   HISTORY OF PRESENT ILLNESS:   This is a 26 yo female with a PMH of Substance Abuse, Schizoaffective Disorder, Oppositional Defiant Disorder, Suicide Attempt, Depression, and Asthma.  She presented to [facility] ER 07/19 via EMS from home due to possible drug overdose.  Per ER notes according to EMS pts family reported hearing her scream "I am going to die" and then they found her minimally responsive to painful stimulation only.  Upon EMS arrival she was given 2 mg narcan with no improvement in mentation.  It was also reported pill bottles of seroquel and trazodone were found at the scene, however the amount ingested by the pt is unknown. In the ER she required mechanical intubation due to an inability to protect her airway. Pt subsequently admitted to the ICU by hospitalist team for further workup and treatment PCCM consulted.    SUBJECTIVE Patient remains intubated and on full vent support Plan to assess neuro status will wean sedation as tolerated Patient remains critically ill      SOCIAL HISTORY: She  reports that she has quit smoking. Her smoking use included Cigarettes. She has a 1.00 pack-year smoking history. She has never used smokeless tobacco. She reports that she drinks alcohol. She reports that she does not use drugs.  REVIEW OF SYSTEMS:   Unable to assess pt mechanically intubated   SUBJECTIVE:  Unable to assess pt mechanically intubated   VITAL SIGNS: BP 110/70   Pulse (!) 102   Temp 98.4 F (36.9 C)   Resp (!) 22   Ht 5\' 6"  (1.676 m)   Wt 121 lb 11.1 oz (55.2 kg)   LMP 06/26/2017 (Approximate)   SpO2 100%   BMI 19.64 kg/m   HEMODYNAMICS:    VENTILATOR SETTINGS: Vent Mode: PRVC FiO2 (%):  [30 %-40 %] 30 % Set Rate:  [14 bmp-16 bmp] 14  bmp Vt Set:  [450 mL] 450 mL PEEP:  [5 cmH20] 5 cmH20  INTAKE / OUTPUT: I/O last 3 completed shifts: In: 1395.2 [I.V.:846.6; NG/GT:498.7; IV Piggyback:50] Out: 925 [Urine:925]  PHYSICAL EXAMINATION: General: acutely ill appearing AA female, mechanically intubated Neuro: sedated, unresponsive, PERRL HEENT: supple, no JVD Cardiovascular: s1s2, rrr, no M/R/G Lungs: rhonchi throughout, even, non labored; no rales, crackles, or wheezes  Abdomen: +BS x4, soft, non distended Musculoskeletal: normal bulk and tone, no edema  Skin: intact no rashes or lesions  LABS:  BMET  Recent Labs Lab 07/16/17 1025 07/16/17 1641 07/17/17 0323  NA 145  --  144  K 3.4*  --  3.6  CL 111  --  111  CO2 25  --  26  BUN 15  --  12  CREATININE 0.75 0.77 0.71  GLUCOSE 92  --  108*    Electrolytes  Recent Labs Lab 07/16/17 1025 07/16/17 1641 07/17/17 0323  CALCIUM 8.6*  --  8.8*  MG  --  2.3  --   PHOS  --  3.4  --     CBC  Recent Labs Lab 07/16/17 1025 07/16/17 1641 07/17/17 0323  WBC 6.7 11.7* 11.6*  HGB 12.1 13.3 12.4  HCT 36.0 41.1 38.3  PLT 205 215 234    Coag's No results for input(s): APTT, INR in the last 168 hours.  Sepsis Markers  Recent Labs Lab 07/16/17 1641 07/17/17 0323  PROCALCITON <0.10 <0.10    ABG  Recent Labs Lab 07/16/17 1523  PHART 7.51*  PCO2ART 34  PO2ART 89    Liver Enzymes  Recent Labs Lab 07/16/17 1025  AST 22  ALT 11*  ALKPHOS 48  BILITOT 1.4*  ALBUMIN 3.7    Cardiac Enzymes  Recent Labs Lab 07/16/17 1025  TROPONINI <0.03    Glucose  Recent Labs Lab 07/16/17 1103 07/16/17 2338 07/17/17 0733  GLUCAP 88 77 115*    Imaging STUDIES:  CT Head 07/19>>negative  Urine Drug 07/19>>+cocaine, amphetamines, cannabinoid, and tricyclic   CULTURES: Respiratory 07/19>>  ANTIBIOTICS: Zosyn 07/19 x1 dose in ER   SIGNIFICANT EVENTS: 07/19-Pt admitted to ICU   LINES/TUBES: ETT 07/19>>  ASSESSMENT /  PLAN: 26-year- female admitted for acute restaurant failure from acute encephalopathy from acute drug abuse Patient remains critically ill on ventilatory support Plan to wean sedation and assess neuro status   PULMONARY A: Mechanical Intubation for airway protection secondary to possible drug overdose  Hx: Asthma  P:   Full vent support for now  Prn ABG's and CXR Scheduled and prn bronchodilator therapy VAP bundle initiated   CARDIOVASCULAR A:  No acute issues  P:  Repeat EKG in am  Trend troponin's Continuous telemetry monitoring   RENAL A:   Mild Hypokalemia  Metabolic Alkalosis  P:   Trend BMP  Replace electrolytes as indicated  Monitor UOP   GASTROINTESTINAL A:   No acute issues  P:   SUP prophylaxis: IV protonix  Initiate TF protocol   HEMATOLOGIC A:   No acute issues  P:  Trend CBC  VTE prophylaxis: Subq Enoxaparin  Monitor for s/sx bleeding  Transfuse for hgb <7  INFECTIOUS A:   No acute issues  Hx: Recent scabies treatment  P:   Trend WBC and monitor fever curve Trend PCT Follow cultures If PCT's elevated will start empiric abx   ENDOCRINE A:   No acute issues   P:   Monitor serum glucose   NEUROLOGIC A:   Acute encephalopathy secondary to possible drug overdose Hx: Polysubstance Abuse, Schizoaffective Disorder, Depression, and Suicidal Attempt  P:   RASS goal: 0 to -1 Propofol gtt to maintain RASS goal  Psychiatry consulted appreciate input Suicidal precautions  Hold outpatient trazodone, aripiprazole, mirtazapine, and seroquel  Poison Control notified by ER    Critical Care Time devoted to patient care services described in this note is 40 minutes.   Overall, patient is critically ill, prognosis is guarded.  Patient with Multiorgan failure and at high risk for cardiac arrest and death.   Plan for sedation vacation and assess for trial of extubation in the next 12-24 hours   Bridget Carpenter, M.D.  [facility]  Medical Director [facility] Medical Director [facility] Cardio-Pulmonary Department

## 2017-07-17 NOTE — Progress Notes (Signed)
Initial Nutrition Assessment  DOCUMENTATION CODES:   Not applicable  INTERVENTION:   RD will order supplements when diet advanced.   NUTRITION DIAGNOSIS:   Inadequate oral intake related to lethargy/confusion as evidenced by NPO status.  GOAL:   Patient will meet greater than or equal to 90% of their needs  MONITOR:   Diet advancement, Labs, Weight trends, I & O's  REASON FOR ASSESSMENT:   Malnutrition Screening Tool    ASSESSMENT:   26 yo female with PMH of PTSD, schizoaffective disorder, substance abuse, depression presents with overdose of seroquel and trazodone, urine screen positive for multiple illicit substances   Pt extubated and transferred out of the unit today. Pt extremely lethargic; unable to sit up or take any meds. Pt continues to be NPO. RD will order supplements when diet advanced. If unable to advance diet in 1-2 days recommend NGT for nutrition and meds. Pt with no recent wt loss; pt has actually gained wt since last admit.    Medications reviewed and include: lovenox  Labs reviewed: Ca 8.8(L), P 3.4 wnl, Mg 2.3 wnl Wbc- 11.6(H)  Nutrition-Focused physical exam completed. Findings are no fat depletion, mild to moderate muscle depletion in BLE, and no edema.   Diet Order:   NPO  Skin:  Reviewed, no issues  Last BM:  none since admit   Height:   Ht Readings from Last 1 Encounters:  07/16/17 5\' 6"  (1.676 m)    Weight:   Wt Readings from Last 1 Encounters:  07/17/17 121 lb 11.1 oz (55.2 kg)    Ideal Body Weight:  59 kg  BMI:  Body mass index is 19.64 kg/m.  Estimated Nutritional Needs:   Kcal:  1650-1850kcal/day   Protein:  55-66g/day   Fluid:  >1.6L/day   EDUCATION NEEDS:   Education needs no appropriate at this time  Betsey Holidayasey Ted Goodner MS, RD, LDN Pager #[phone] After Hours Pager: [phone]

## 2017-07-17 NOTE — Progress Notes (Signed)
[facility] [place] [facility]                                                                                                                                                                                  Patient Demographics   [name], is a 26 y.o. female, DOB - [birth year], ZOX:[id number]  Admit date - 07/16/2017   Admitting Physician Oralia Manis, MD  Outpatient Primary MD for the patient is Center[facility]   LOS - 1  Subjective: Patient admitted with overdose currently intubated and sedated    Review of Systems:   CONSTITUTIONAL: Unable to provide due to intubation  Vitals:   Vitals:   07/17/17 0840 07/17/17 0900 07/17/17 1000 07/17/17 1218  BP:  122/81 117/74 125/78  Pulse:  (!) 106 99 92  Resp:  17 (!) 22   Temp: 98.1 F (36.7 C)   98 F (36.7 C)  TempSrc: Axillary   Oral  SpO2:  100% 100% 100%  Weight:      Height:        Wt Readings from Last 3 Encounters:  07/17/17 121 lb 11.1 oz (55.2 kg)  07/12/17 115 lb (52.2 kg)  07/11/17 115 lb (52.2 kg)     Intake/Output Summary (Last 24 hours) at 07/17/17 1341 Last data filed at 07/17/17 1300  Gross per 24 hour  Intake          1676.73 ml  Output             1425 ml  Net           251.73 ml    Physical Exam:   GENERAL: Critically ill intubated HEAD, EYES, EARS, NOSE AND THROAT: Atraumatic, normocephalic. Extraocular muscles are intact. Pupils equal and reactive to light. Sclerae anicteric. No conjunctival injection. No oro-pharyngeal erythema.  NECK: Supple. There is no jugular venous distention. No bruits, no lymphadenopathy, no thyromegaly.  HEART: Regular rate and rhythm,. No murmurs, no rubs, no clicks.  LUNGS: Decreased breath  ABDOMEN: Soft, flat, nontender, nondistended. Has good bowel sounds. No hepatosplenomegaly appreciated.  EXTREMITIES: No evidence of any cyanosis, clubbing, or peripheral edema.  +2 pedal and radial pulses bilaterally.  NEUROLOGIC:  Sedated SKIN: Moist and warm with no rashes appreciated.  Psych: Sedated  LN: No inguinal LN enlargement    Antibiotics   Anti-infectives    Start     Dose/Rate Route Frequency Ordered Stop   07/16/17 1445  piperacillin-tazobactam (ZOSYN) IVPB 3.375 g     3.375 g 100 mL/hr over 30 Minutes Intravenous  Once 07/16/17 1432 07/16/17 1540   07/16/17 1439  piperacillin-tazobactam (ZOSYN) 3-0.375 GM/50ML IVPB  Comments:  Broomer, Olivia   : cabinet override      07/16/17 1439 07/17/17 0244      Medications   Scheduled Meds: . Chlorhexidine Gluconate Cloth  6 each Topical Q0600  . enoxaparin (LOVENOX) injection  40 mg Subcutaneous Q24H  . mupirocin ointment  1 application Nasal BID   Continuous Infusions: PRN Meds:.acetaminophen **OR** acetaminophen, ondansetron **OR** ondansetron (ZOFRAN) IV   Data Review:   Micro Results Recent Results (from the past 240 hour(s))  Culture, respiratory (NON-Expectorated)     Status: None (Preliminary result)   Collection Time: 07/16/17  4:12 PM  Result Value Ref Range Status   Specimen Description TRACHEAL ASPIRATE  Final   Special Requests NONE  Final   Gram Stain   Final    ABUNDANT WBC PRESENT,BOTH PMN AND MONONUCLEAR FEW GRAM POSITIVE COCCI IN PAIRS RARE GRAM NEGATIVE COCCI IN PAIRS RARE GRAM NEGATIVE RODS    Culture   Final    TOO YOUNG TO READ Performed at [facility], [address], [place], [place] [address]    Report Status PENDING  Incomplete  MRSA PCR Screening     Status: Abnormal   Collection Time: 07/16/17  4:24 PM  Result Value Ref Range Status   MRSA by PCR POSITIVE (A) NEGATIVE Final    Comment:        The GeneXpert MRSA Assay (FDA approved for NASAL specimens only), is one component of a comprehensive MRSA colonization surveillance program. It is not intended to diagnose MRSA infection nor to guide or monitor treatment for MRSA infections. CRITICAL RESULT CALLED TO, READ BACK BY AND VERIFIED  WITH: Harlon Ditty AT 1800 ON 07/16/2017 JJB     Radiology Reports Dg Chest 1 View  Result Date: 07/16/2017 CLINICAL DATA:  Altered mental status.  Dyspnea. EXAM: CHEST 1 VIEW COMPARISON:  03/13/2017 FINDINGS: Multiple telemetry leads overlie the chest. The cardiomediastinal silhouette is within normal limits. The lungs are less well inflated than on the prior study, and there is minimally increased infrahilar density in the medial right lung base compared to the prior study. The lungs are otherwise clear. No pleural effusion or pneumothorax is identified. No acute osseous abnormality is seen. IMPRESSION: Minimal right infrahilar opacity, likely atelectasis though aspiration is not excluded. Electronically Signed   By: Sebastian Ache M.D.   On: 07/16/2017 13:54   Dg Abdomen 1 View  Result Date: 07/16/2017 CLINICAL DATA:  Orogastric tube placement EXAM: ABDOMEN - 1 VIEW COMPARISON:  None. FINDINGS: Orogastric tube tip is in the proximal stomach. Side port is not seen but likely is at the approximate level of the gastroesophageal junction. There is diffuse stool throughout much of the colon. There is no bowel dilatation or air-fluid level to suggest bowel obstruction. No free air. There is a probable small phlebolith in the right pelvis. IMPRESSION: Orogastric tube tip in proximal stomach. Side port not seen. Suspect side-port near gastroesophageal junction. Advise advancing orogastric tube 6-8 cm to insure that with tube tip and side port are well within the stomach. Fairly diffuse stool throughout colon. No bowel obstruction or free air evident. Electronically Signed   By: Bretta Bang III M.D.   On: 07/16/2017 14:40   Ct Head Wo Contrast  Result Date: 07/16/2017 CLINICAL DATA:  Altered mental status/nonresponsive EXAM: CT HEAD WITHOUT CONTRAST TECHNIQUE: Contiguous axial images were obtained from the base of the skull through the vertex without intravenous contrast. COMPARISON:  October 22, 2016  FINDINGS: Brain:  The ventricles are normal in size and configuration. There is no intracranial mass, hemorrhage, extra-axial fluid collection, or midline shift. Gray-white compartments are normal. No evident acute infarct. Vascular: No hyperdense vessel. No vascular calcifications are evident. Skull: The bony calvarium appears intact. Sinuses/Orbits: Visualized paranasal sinuses are clear. Visualized orbits appear symmetric bilaterally. Other: Visualized mastoid air cells are clear. IMPRESSION: Study within normal limits. Electronically Signed   By: Bretta Bang III M.D.   On: 07/16/2017 12:01   Dg Chest Portable 1 View  Result Date: 07/16/2017 CLINICAL DATA:  Intubation. EXAM: PORTABLE CHEST 1 VIEW COMPARISON:  07/16/2017 . FINDINGS: Endotracheal tube tip noted 2 cm above the carina. NG tube tip noted below left hemidiaphragm. Heart size normal. Mild right base subsegmental atelectasis/ infiltrate cannot be excluded. No pleural effusion or pneumothorax. IMPRESSION: 1.  Lines and tubes in good anatomic position. 2. Mild right base subsegmental atelectasis/infiltrate cannot be excluded. Electronically Signed   By: Maisie Fus  Register   On: 07/16/2017 14:43     CBC  Recent Labs Lab 07/16/17 1025 07/16/17 1641 07/17/17 0323  WBC 6.7 11.7* 11.6*  HGB 12.1 13.3 12.4  HCT 36.0 41.1 38.3  PLT 205 215 234  MCV 93.8 94.1 93.9  MCH 31.6 30.5 30.4  MCHC 33.7 32.5 32.4  RDW 13.5 14.0 13.9  LYMPHSABS 1.8  --  1.1  MONOABS 0.7  --  0.8  EOSABS 0.2  --  0.2  BASOSABS 0.0  --  0.0    Chemistries   Recent Labs Lab 07/16/17 1025 07/16/17 1641 07/17/17 0323  NA 145  --  144  K 3.4*  --  3.6  CL 111  --  111  CO2 25  --  26  GLUCOSE 92  --  108*  BUN 15  --  12  CREATININE 0.75 0.77 0.71  CALCIUM 8.6*  --  8.8*  MG  --  2.3  --   AST 22  --   --   ALT 11*  --   --   ALKPHOS 48  --   --   BILITOT 1.4*  --   --     ------------------------------------------------------------------------------------------------------------------ estimated creatinine clearance is 92.9 mL/min (by C-G formula based on SCr of 0.71 mg/dL). ------------------------------------------------------------------------------------------------------------------ No results for input(s): HGBA1C in the last 72 hours. ------------------------------------------------------------------------------------------------------------------  Recent Labs  07/16/17 1641  TRIG 42   ------------------------------------------------------------------------------------------------------------------ No results for input(s): TSH, T4TOTAL, T3FREE, THYROIDAB in the last 72 hours.  Invalid input(s): FREET3 ------------------------------------------------------------------------------------------------------------------ No results for input(s): VITAMINB12, FOLATE, FERRITIN, TIBC, IRON, RETICCTPCT in the last 72 hours.  Coagulation profile No results for input(s): INR, PROTIME in the last 168 hours.  No results for input(s): DDIMER in the last 72 hours.  Cardiac Enzymes  Recent Labs Lab 07/16/17 1025  TROPONINI <0.03   ------------------------------------------------------------------------------------------------------------------ Invalid input(s): POCBNP    Assessment & Plan  Patient is a 26 year old admitted with overdose currently intubated * Overdose - overdosed on Seroquel and Trazodone,  Extubation psych eval post extubation  *  Aspiration pneumonia (HCC) - patient is intubated, pro-calcitonin is undetectable however tracheal aspirate showing bacteria I will port on Levaquin for now  *  Acute respiratory failure (HCC) - due to her overdose and aspiration pneumonia. Plan for extubation * Polysubstance abuse - seen on urine tox screen, unclear if any of those substances may also be laying a role in her current condition  * Borderline  personality disorder - psychiatry consult  * Major depressive disorder, recurrent  severe without psychotic features [facility](HCC) - psych consult,     Code Status Orders        Start     Ordered   07/16/17 1629  Full code  Continuous     07/16/17 1628    Code Status History    Date Active Date Inactive Code Status Order ID Comments User Context   05/07/2017  3:48 PM 05/11/2017  9:06 PM Full Code [id number]  Audery Amellapacs, John T, MD Inpatient   11/05/2016  4:31 PM 11/12/2016  2:25 PM Full Code [id number]  Audery Amellapacs, John T, MD Inpatient   08/13/2016  8:07 PM 08/18/2016  4:21 PM Full Code [id number]  Audery Amellapacs, John T, MD Inpatient   07/21/2015 12:59 PM 07/21/2015  4:36 PM Full Code [id number]  Clapacs, Jackquline DenmarkJohn T, MD Inpatient           Consults  Psychiatry, pulmonary critical care DVT Prophylaxis  Lovenox  Lab Results  Component Value Date   PLT 234 07/17/2017     Time Spent in minutes   35min  Greater than 50% of time spent in care coordination and counseling patient regarding the condition and plan of care.   Auburn BilberryPATEL, Dinh Ayotte M.D on 07/17/2017 at 1:41 PM  Between 7am to 6pm - Pager - [phone]  After 6pm go to [email or web address] - password [email or web address] [email or web address]  [facility]   Office  [phone]

## 2017-07-17 NOTE — Progress Notes (Signed)
   07/17/17 1900  Clinical Encounter Type  Visited With Patient not available;Health care provider  Visit Type Psychological support  Referral From Nurse;Physician  CH unable to visit with patient; Per RN patient is still disoriented; CH will follow up if needed.

## 2017-07-17 NOTE — Progress Notes (Addendum)
[facility] Elink and CCMD notified of transfer. Patient aware of plan of care. Attempted to notify patient's family of transfer, no answer. Patient transferred with sitter. Trudee KusterBrandi R Mansfield

## 2017-07-18 ENCOUNTER — Inpatient Hospital Stay
Admission: AD | Admit: 2017-07-18 | Discharge: 2017-07-22 | DRG: 885 | Disposition: A | Discharge status: Home / Self Care | Payer: Medicaid Other | Attending: Psychiatry | Admitting: Psychiatry

## 2017-07-18 DIAGNOSIS — Z79899 Other long term (current) drug therapy: Secondary | ICD-10-CM | POA: Diagnosis not present

## 2017-07-18 DIAGNOSIS — J690 Pneumonitis due to inhalation of food and vomit: Secondary | ICD-10-CM | POA: Diagnosis present

## 2017-07-18 DIAGNOSIS — F4310 Post-traumatic stress disorder, unspecified: Secondary | ICD-10-CM | POA: Diagnosis present

## 2017-07-18 DIAGNOSIS — T50901A Poisoning by unspecified drugs, medicaments and biological substances, accidental (unintentional), initial encounter: Secondary | ICD-10-CM | POA: Diagnosis present

## 2017-07-18 DIAGNOSIS — J45909 Unspecified asthma, uncomplicated: Secondary | ICD-10-CM | POA: Diagnosis present

## 2017-07-18 DIAGNOSIS — F1510 Other stimulant abuse, uncomplicated: Secondary | ICD-10-CM | POA: Diagnosis present

## 2017-07-18 DIAGNOSIS — T50902D Poisoning by unspecified drugs, medicaments and biological substances, intentional self-harm, subsequent encounter: Secondary | ICD-10-CM | POA: Diagnosis not present

## 2017-07-18 DIAGNOSIS — R45851 Suicidal ideations: Secondary | ICD-10-CM | POA: Diagnosis present

## 2017-07-18 DIAGNOSIS — G4700 Insomnia, unspecified: Secondary | ICD-10-CM | POA: Diagnosis present

## 2017-07-18 DIAGNOSIS — Z91018 Allergy to other foods: Secondary | ICD-10-CM | POA: Diagnosis not present

## 2017-07-18 DIAGNOSIS — F1010 Alcohol abuse, uncomplicated: Secondary | ICD-10-CM | POA: Diagnosis present

## 2017-07-18 DIAGNOSIS — Z22322 Carrier or suspected carrier of Methicillin resistant Staphylococcus aureus: Secondary | ICD-10-CM | POA: Diagnosis not present

## 2017-07-18 DIAGNOSIS — Z9119 Patient's noncompliance with other medical treatment and regimen: Secondary | ICD-10-CM | POA: Diagnosis not present

## 2017-07-18 DIAGNOSIS — Z915 Personal history of self-harm: Secondary | ICD-10-CM | POA: Diagnosis not present

## 2017-07-18 DIAGNOSIS — Z560 Unemployment, unspecified: Secondary | ICD-10-CM | POA: Diagnosis not present

## 2017-07-18 DIAGNOSIS — F3162 Bipolar disorder, current episode mixed, moderate: Secondary | ICD-10-CM | POA: Diagnosis not present

## 2017-07-18 DIAGNOSIS — F3164 Bipolar disorder, current episode mixed, severe, with psychotic features: Secondary | ICD-10-CM | POA: Diagnosis not present

## 2017-07-18 DIAGNOSIS — F1420 Cocaine dependence, uncomplicated: Secondary | ICD-10-CM | POA: Diagnosis not present

## 2017-07-18 DIAGNOSIS — F259 Schizoaffective disorder, unspecified: Secondary | ICD-10-CM | POA: Diagnosis present

## 2017-07-18 DIAGNOSIS — F913 Oppositional defiant disorder: Secondary | ICD-10-CM | POA: Diagnosis present

## 2017-07-18 DIAGNOSIS — Z87891 Personal history of nicotine dependence: Secondary | ICD-10-CM | POA: Diagnosis not present

## 2017-07-18 DIAGNOSIS — F1910 Other psychoactive substance abuse, uncomplicated: Secondary | ICD-10-CM | POA: Diagnosis not present

## 2017-07-18 DIAGNOSIS — L309 Dermatitis, unspecified: Secondary | ICD-10-CM | POA: Diagnosis not present

## 2017-07-18 DIAGNOSIS — F1220 Cannabis dependence, uncomplicated: Secondary | ICD-10-CM | POA: Diagnosis present

## 2017-07-18 DIAGNOSIS — Z818 Family history of other mental and behavioral disorders: Secondary | ICD-10-CM

## 2017-07-18 DIAGNOSIS — F419 Anxiety disorder, unspecified: Secondary | ICD-10-CM | POA: Diagnosis present

## 2017-07-18 DIAGNOSIS — F603 Borderline personality disorder: Secondary | ICD-10-CM | POA: Diagnosis not present

## 2017-07-18 DIAGNOSIS — F332 Major depressive disorder, recurrent severe without psychotic features: Secondary | ICD-10-CM | POA: Diagnosis not present

## 2017-07-18 LAB — GLUCOSE, CAPILLARY
Glucose-Capillary: 106 mg/dL — ABNORMAL HIGH (ref 65–99)
Glucose-Capillary: 109 mg/dL — ABNORMAL HIGH (ref 65–99)
Glucose-Capillary: 131 mg/dL — ABNORMAL HIGH (ref 65–99)
Glucose-Capillary: 65 mg/dL (ref 65–99)

## 2017-07-18 LAB — PROCALCITONIN: Procalcitonin: <0.1 ng/mL

## 2017-07-18 MED ORDER — LEVOFLOXACIN 500 MG PO TABS
500.0000 mg | ORAL_TABLET | Freq: Every day | ORAL | Status: DC
  Administered 2017-07-19 – 2017-07-22 (×4): 500 mg via ORAL
  Filled 2017-07-18 (×4): qty 1

## 2017-07-18 MED ORDER — MUPIROCIN 2 % EX OINT: 1.0000 "application " | TOPICAL_OINTMENT | Freq: Two times a day (BID) | CUTANEOUS | 0 refills | Status: DC

## 2017-07-18 MED ORDER — MAGNESIUM HYDROXIDE 400 MG/5ML PO SUSP: 30.0000 mL | Freq: Every day | ORAL | Status: DC | PRN

## 2017-07-18 MED ORDER — ALUM & MAG HYDROXIDE-SIMETH 200-200-20 MG/5ML PO SUSP
30.0000 mL | ORAL | Status: DC | PRN
  Administered 2017-07-19 (×2): 30 mL via ORAL
  Filled 2017-07-18 (×2): qty 30

## 2017-07-18 MED ORDER — DIVALPROEX SODIUM ER 500 MG PO TB24
750.0000 mg | ORAL_TABLET | Freq: Every day | ORAL | Status: DC
  Administered 2017-07-19 – 2017-07-20 (×2): 750 mg via ORAL
  Filled 2017-07-18 (×4): qty 1

## 2017-07-18 MED ORDER — ACETAMINOPHEN 650 MG RE SUPP
650.0000 mg | Freq: Four times a day (QID) | RECTAL | Status: DC | PRN
  Filled 2017-07-18: qty 1

## 2017-07-18 MED ORDER — MUPIROCIN 2 % EX OINT
1.0000 | TOPICAL_OINTMENT | Freq: Two times a day (BID) | CUTANEOUS | Status: AC
Start: 2017-07-18 — End: 2017-07-21
  Administered 2017-07-19: 1 via NASAL
  Filled 2017-07-18: qty 22

## 2017-07-18 MED ORDER — LEVOFLOXACIN 500 MG PO TABS: 500.0000 mg | ORAL_TABLET | Freq: Every day | ORAL | Status: DC

## 2017-07-18 MED ORDER — ACETAMINOPHEN 325 MG PO TABS: 650.0000 mg | ORAL_TABLET | Freq: Four times a day (QID) | ORAL | Status: DC | PRN

## 2017-07-18 MED ORDER — LORAZEPAM 2 MG PO TABS: 2.0000 mg | ORAL_TABLET | Freq: Four times a day (QID) | ORAL | Status: DC | PRN

## 2017-07-18 MED ORDER — LEVOFLOXACIN 500 MG PO TABS: 500.0000 mg | ORAL_TABLET | Freq: Every day | ORAL | 0 refills | Status: DC

## 2017-07-18 MED ORDER — LORAZEPAM 2 MG/ML IJ SOLN: 2.0000 mg | Freq: Four times a day (QID) | INTRAMUSCULAR | Status: DC | PRN

## 2017-07-18 MED ORDER — MIRTAZAPINE 15 MG PO TABS
15.0000 mg | ORAL_TABLET | Freq: Every day | ORAL | Status: DC
  Administered 2017-07-19 – 2017-07-20 (×2): 15 mg via ORAL
  Filled 2017-07-18 (×2): qty 1

## 2017-07-18 MED ORDER — ALBUTEROL SULFATE HFA 108 (90 BASE) MCG/ACT IN AERS
1.0000 | INHALATION_SPRAY | RESPIRATORY_TRACT | Status: DC | PRN
  Filled 2017-07-18: qty 6.7

## 2017-07-18 MED ORDER — LEVOFLOXACIN 500 MG PO TABS
500.0000 mg | ORAL_TABLET | Freq: Every day | ORAL | Status: DC
  Administered 2017-07-18: 500 mg via ORAL
  Filled 2017-07-18: qty 1

## 2017-07-18 NOTE — ED Notes (Signed)
[facility] Dr. Maryruth BunKapur.  Attending Physician will be Dr. Jennet MaduroPucilowska.   Patient has [facility] room 323[facility] .   Intake Paper Work has been signed and placed on patient chart.  The appropriate staff has been aware of the admission [facility](  Mary Medical Floor Nurse, Patient's Nurse & Marylene LandAngela Patient Registration ).

## 2017-07-18 NOTE — Progress Notes (Signed)
Patient in bed. Not in any distress. Sitter at bedside. Suicide precautions in place.  Needs attended. Concerns addressed. Kept safe and comfortable.

## 2017-07-18 NOTE — Progress Notes (Signed)
[facility] - [place] at [facility]                                                                                                                                                                                  Patient Demographics   [name] Shirer, is a 26 y.o. female, DOB - 03-24-1991, ZOX:[id number]  Admit date - 07/16/2017   Admitting Physician Oralia Manis, MD  Outpatient Primary MD for the patient is [facility], [facility]   LOS - 2  Subjective: Patient admitted with Seroquel and trazodone overdose , intubated , extubated yesterday, transferred to regular medical floor. Feels good today, denies any significant pain or discomfort. Denies breath, admits of some dry cough. Chest x-ray is consistent with atelectasis versus aspiration pneumonitis. Now on Levaquin orally. Tracheal aspirate culture is pending. The patient admits of intermittent low back pains, requests pain medications. Admits of left lower extremity weakness, which happened just on the day of admission. She requests to be allowed to shower, becomes visibly unhappy when  recommended to be seen by physical therapist first before going to the shower.     Review of Systems:   CONSTITUTIONAL: Unable to provide due to intubation  Vitals:   Vitals:   07/17/17 1218 07/17/17 2031 07/18/17 0424 07/18/17 0642  BP: 125/78 122/78  114/76  Pulse: 92 88  75  Resp:  18    Temp: 98 F (36.7 C) 98.2 F (36.8 C)    TempSrc: Oral Oral    SpO2: 100% 100%  100%  Weight:   55.4 kg (122 lb 1.6 oz)   Height:        Wt Readings from Last 3 Encounters:  07/18/17 55.4 kg (122 lb 1.6 oz)  07/12/17 52.2 kg (115 lb)  07/11/17 52.2 kg (115 lb)     Intake/Output Summary (Last 24 hours) at 07/18/17 1328 Last data filed at 07/18/17 0431  Gross per 24 hour  Intake              460 ml  Output                0 ml  Net              460 ml    Physical Exam:   GENERAL: Critically ill intubated HEAD, EYES, EARS,  NOSE AND THROAT: Atraumatic, normocephalic. Extraocular muscles are intact. Pupils equal and reactive to light. Sclerae anicteric. No conjunctival injection. No oro-pharyngeal erythema.  NECK: Supple. There is no jugular venous distention. No bruits, no lymphadenopathy, no thyromegaly.  HEART: Regular rate and rhythm,. No murmurs, no rubs, no clicks.  LUNGS: Decreased breath  ABDOMEN: Soft, flat,  nontender, nondistended. Has good bowel sounds. No hepatosplenomegaly appreciated.  EXTREMITIES: No evidence of any cyanosis, clubbing, or peripheral edema.  +2 pedal and radial pulses bilaterally.  NEUROLOGIC: SedatedLeft lower extremity weakness. On exam, although poor effort, neurologically otherwise the patient is intact. Head CT was normal on admission SKIN: Moist and warm with no rashes appreciated.  Psych: Sedated  LN: No inguinal LN enlargement    Antibiotics   Anti-infectives    Start     Dose/Rate Route Frequency Ordered Stop   07/18/17 1800  levofloxacin (LEVAQUIN) tablet 500 mg     500 mg Oral Daily 07/18/17 1154     07/17/17 1600  levofloxacin (LEVAQUIN) IVPB 500 mg  Status:  Discontinued     500 mg 100 mL/hr over 60 Minutes Intravenous Every 24 hours 07/17/17 1546 07/18/17 1154   07/17/17 1500  levofloxacin (LEVAQUIN) tablet 500 mg  Status:  Discontinued     500 mg Oral Daily 07/17/17 1345 07/17/17 1546   07/16/17 1445  piperacillin-tazobactam (ZOSYN) IVPB 3.375 g     3.375 g 100 mL/hr over 30 Minutes Intravenous  Once 07/16/17 1432 07/16/17 1540   07/16/17 1439  piperacillin-tazobactam (ZOSYN) 3-0.375 GM/50ML IVPB    Comments:  Broomer, Olivia   : cabinet override      07/16/17 1439 07/17/17 0244      Medications   Scheduled Meds: . Chlorhexidine Gluconate Cloth  6 each Topical Q0600  . enoxaparin (LOVENOX) injection  40 mg Subcutaneous Q24H  . levofloxacin  500 mg Oral Daily  . mupirocin ointment  1 application Nasal BID   Continuous Infusions: PRN Meds:.acetaminophen  **OR** acetaminophen, ondansetron **OR** ondansetron (ZOFRAN) IV   Data Review:   Micro Results Recent Results (from the past 240 hour(s))  Culture, respiratory (NON-Expectorated)     Status: None (Preliminary result)   Collection Time: 07/16/17  4:12 PM  Result Value Ref Range Status   Specimen Description TRACHEAL ASPIRATE  Final   Special Requests NONE  Final   Gram Stain   Final    ABUNDANT WBC PRESENT,BOTH PMN AND MONONUCLEAR FEW GRAM POSITIVE COCCI IN PAIRS RARE GRAM NEGATIVE COCCI IN PAIRS RARE GRAM NEGATIVE RODS    Culture   Final    CULTURE REINCUBATED FOR BETTER GROWTH Performed at [facility], [address] [place], [place] [address]    Report Status PENDING  Incomplete  MRSA PCR Screening     Status: Abnormal   Collection Time: 07/16/17  4:24 PM  Result Value Ref Range Status   MRSA by PCR POSITIVE (A) NEGATIVE Final    Comment:        The GeneXpert MRSA Assay (FDA approved for NASAL specimens only), is one component of a comprehensive MRSA colonization surveillance program. It is not intended to diagnose MRSA infection nor to guide or monitor treatment for MRSA infections. CRITICAL RESULT CALLED TO, READ BACK BY AND VERIFIED WITH: Harlon DittyJEREMIAH KEENE AT 1800 ON 07/16/2017 JJB     Radiology Reports Dg Chest 1 View  Result Date: 07/16/2017 CLINICAL DATA:  Altered mental status.  Dyspnea. EXAM: CHEST 1 VIEW COMPARISON:  03/13/2017 FINDINGS: Multiple telemetry leads overlie the chest. The cardiomediastinal silhouette is within normal limits. The lungs are less well inflated than on the prior study, and there is minimally increased infrahilar density in the medial right lung base compared to the prior study. The lungs are otherwise clear. No pleural effusion or pneumothorax is identified. No acute osseous abnormality is seen. IMPRESSION:  Minimal right infrahilar opacity, likely atelectasis though aspiration is not excluded. Electronically Signed   By: Sebastian Ache M.D.   On: 07/16/2017 13:54   Dg Abdomen 1 View  Result Date: 07/16/2017 CLINICAL DATA:  Orogastric tube placement EXAM: ABDOMEN - 1 VIEW COMPARISON:  None. FINDINGS: Orogastric tube tip is in the proximal stomach. Side port is not seen but likely is at the approximate level of the gastroesophageal junction. There is diffuse stool throughout much of the colon. There is no bowel dilatation or air-fluid level to suggest bowel obstruction. No free air. There is a probable small phlebolith in the right pelvis. IMPRESSION: Orogastric tube tip in proximal stomach. Side port not seen. Suspect side-port near gastroesophageal junction. Advise advancing orogastric tube 6-8 cm to insure that with tube tip and side port are well within the stomach. Fairly diffuse stool throughout colon. No bowel obstruction or free air evident. Electronically Signed   By: Bretta Bang III M.D.   On: 07/16/2017 14:40   Ct Head Wo Contrast  Result Date: 07/16/2017 CLINICAL DATA:  Altered mental status/nonresponsive EXAM: CT HEAD WITHOUT CONTRAST TECHNIQUE: Contiguous axial images were obtained from the base of the skull through the vertex without intravenous contrast. COMPARISON:  October 22, 2016 FINDINGS: Brain: The ventricles are normal in size and configuration. There is no intracranial mass, hemorrhage, extra-axial fluid collection, or midline shift. Gray-white compartments are normal. No evident acute infarct. Vascular: No hyperdense vessel. No vascular calcifications are evident. Skull: The bony calvarium appears intact. Sinuses/Orbits: Visualized paranasal sinuses are clear. Visualized orbits appear symmetric bilaterally. Other: Visualized mastoid air cells are clear. IMPRESSION: Study within normal limits. Electronically Signed   By: Bretta Bang III M.D.   On: 07/16/2017 12:01   Dg Chest Portable 1 View  Result Date: 07/16/2017 CLINICAL DATA:  Intubation. EXAM: PORTABLE CHEST 1 VIEW COMPARISON:  07/16/2017 .  FINDINGS: Endotracheal tube tip noted 2 cm above the carina. NG tube tip noted below left hemidiaphragm. Heart size normal. Mild right base subsegmental atelectasis/ infiltrate cannot be excluded. No pleural effusion or pneumothorax. IMPRESSION: 1.  Lines and tubes in good anatomic position. 2. Mild right base subsegmental atelectasis/infiltrate cannot be excluded. Electronically Signed   By: Maisie Fus  Register   On: 07/16/2017 14:43     CBC  Recent Labs Lab 07/16/17 1025 07/16/17 1641 07/17/17 0323  WBC 6.7 11.7* 11.6*  HGB 12.1 13.3 12.4  HCT 36.0 41.1 38.3  PLT 205 215 234  MCV 93.8 94.1 93.9  MCH 31.6 30.5 30.4  MCHC 33.7 32.5 32.4  RDW 13.5 14.0 13.9  LYMPHSABS 1.8  --  1.1  MONOABS 0.7  --  0.8  EOSABS 0.2  --  0.2  BASOSABS 0.0  --  0.0    Chemistries   Recent Labs Lab 07/16/17 1025 07/16/17 1641 07/17/17 0323  NA 145  --  144  K 3.4*  --  3.6  CL 111  --  111  CO2 25  --  26  GLUCOSE 92  --  108*  BUN 15  --  12  CREATININE 0.75 0.77 0.71  CALCIUM 8.6*  --  8.8*  MG  --  2.3  --   AST 22  --   --   ALT 11*  --   --   ALKPHOS 48  --   --   BILITOT 1.4*  --   --    ------------------------------------------------------------------------------------------------------------------ estimated creatinine clearance is 93.2 mL/min (by C-G formula based  on SCr of 0.71 mg/dL). ------------------------------------------------------------------------------------------------------------------ No results for input(s): HGBA1C in the last 72 hours. ------------------------------------------------------------------------------------------------------------------  Recent Labs  07/16/17 1641  TRIG 42   ------------------------------------------------------------------------------------------------------------------ No results for input(s): TSH, T4TOTAL, T3FREE, THYROIDAB in the last 72 hours.  Invalid input(s):  FREET3 ------------------------------------------------------------------------------------------------------------------ No results for input(s): VITAMINB12, FOLATE, FERRITIN, TIBC, IRON, RETICCTPCT in the last 72 hours.  Coagulation profile No results for input(s): INR, PROTIME in the last 168 hours.  No results for input(s): DDIMER in the last 72 hours.  Cardiac Enzymes  Recent Labs Lab 07/16/17 1025  TROPONINI <0.03   ------------------------------------------------------------------------------------------------------------------ Invalid input(s): POCBNP    Assessment & Plan  Patient is a 26 year old admitted with overdose Of Seroquel and trazodone, intubated for airway protection, now extubated and transferred to medical floor.   * Overdose - overdosed on Seroquel and Trazodone,  Psych recommendations are pending, possible discharge to behavioral medicine unit today, since patient is stable medically  *  Aspiration pneumonia (HCC) - continue Levaquin , no significant sputum production, not hypoxic  *  Acute respiratory failure (HCC) - due to her overdose and aspiration pneumonia. , Now extubated, not requiring oxygen therapy  * Polysubstance abuse - seen on urine tox screen, the patient will need to discuss with psychiatrist substance abuse problems and therapy   * Borderline personality disorder - psychiatry consult is pending   * Major depressive disorder, recurrent severe without psychotic features [facility]) - psych consult is pending  * Left lower extremity weakness, likely due to chronic lumbar issues, although cannot rule out behavioral, getting physical therapist involved      Code Status Orders        Start     Ordered   07/16/17 1629  Full code  Continuous     07/16/17 1628    Code Status History    Date Active Date Inactive Code Status Order ID Comments User Context   05/07/2017  3:48 PM 05/11/2017  9:06 PM Full Code [id number]  Audery Amel, MD  Inpatient   11/05/2016  4:31 PM 11/12/2016  2:25 PM Full Code [id number]  Audery Amel, MD Inpatient   08/13/2016  8:07 PM 08/18/2016  4:21 PM Full Code [id number]  Audery Amel, MD Inpatient   07/21/2015 12:59 PM 07/21/2015  4:36 PM Full Code [id number]  Clapacs, [name], MD Inpatient           Consults  Psychiatry, pulmonary critical care DVT Prophylaxis  Lovenox  Lab Results  Component Value Date   PLT 234 07/17/2017     Time Spent in minutes       Katharin Schneider M.D on 07/18/2017 at 1:28 PM  Between 7am to 6pm - Pager - [phone]  After 6pm go to [email or web address] - password [email or web address] [facility]  [facility]   Office  [phone]

## 2017-07-18 NOTE — Progress Notes (Signed)
Hypoglycemic Event  CBG: 65  Treatment:4oz of orange juice  Symptoms: Asymptomatic  Follow-up CBG: Time: 0045 CBG Result: 131  Possible Reasons for Event: not eating good  Bridget Carpenter, [name]

## 2017-07-18 NOTE — Plan of Care (Signed)
Problem: Coping: Goal: Ability to verbalize frustrations and anger appropriately will improve Outcome: Progressing Pt stated she did not need medications and she was not going to take medications  Problem: Safety: Goal: Periods of time without injury will increase Outcome: Progressing Pt safe on the unit at this time

## 2017-07-18 NOTE — Discharge Summary (Signed)
[facility] - [place] at [facility]   PATIENT NAME: Bridget Carpenter    MR#:  [id number]  DATE OF BIRTH:  08-30-1991  DATE OF ADMISSION:  07/16/2017 ADMITTING PHYSICIAN: Oralia Manis, MD  DATE OF DISCHARGE: No discharge date for patient encounter.  PRIMARY CARE PHYSICIAN: [facility], [facility]     ADMISSION DIAGNOSIS:  Polysubstance abuse [F19.10] Intentional drug overdose, initial encounter (HCC) [T50.902A]  DISCHARGE DIAGNOSIS:  Principal Problem:   Overdose Active Problems:   Borderline personality disorder   Major depressive disorder, recurrent severe without psychotic features (HCC)   Aspiration pneumonia (HCC)   Acute respiratory failure (HCC)   Polysubstance abuse   MRSA carrier   SECONDARY DIAGNOSIS:   Past Medical History:  Diagnosis Date  . Asthma   . Depression 2009   Inpatient psych admission for SI, dissociative fugue  . Dissociative disorder or reaction 2009  . Eczema   . H/O: suicide attempt   . ODD (oppositional defiant disorder)   . PTSD (post-traumatic stress disorder)   . Schizoaffective disorder (HCC)   . Substance abuse     .pro HOSPITAL COURSE:   Patient is 26 year old female with history of depression, history of suicide attempt in the past, oppositional defiant disorder, post traumatic stress disorder, schizoaffective disorder, substance abuse, who presents to the hospital with overdose of Seroquel and trazodone. She was severely sedated due to medications, requiring intubation and mechanical ventilation. Her labs revealed mild leukocytosis. Chest x-ray showed mild right infrahilar opacity, concerning for aspiration pneumonitis. Head CT was unremarkable. Urine drug screen was positive for amphetamines, cocaine, cannabinoids, tricyclics. MRSA PCR was positive. Patient was initiated on antibiotic therapy and pulmonology consultation was requested. She was admitted to intensive care unit initially, however,  extubated very quickly after improvement of her mental status. She was weaned off oxygen therapy and was felt to be medically stable to be admitted to behavioral medicine unit for further therapy.  Discussion by problem: #1. Overdose with trazodone and Seroquel, improved mental status, patient was seen by psychiatrist recommended behavioral medicine unit admission, where she will be transferred today #2. Aspiration pneumonitis with acute respiratory failure requiring intubation due to altered mental status, continue patient on levofloxacin for next 5 days to complete course #3 per substance abuse, patient is a 2 discuss substance abuse issues with the psychiatrist #4. MRSA carrier, continue mupirocin for the next 4 days to complete course #5. Borderline personality disorder, major depressive disorder, continue outpatient medications, therapy per psychiatrist, discussed with Dr. Maryruth Bun #6. Left lower extremity weakness per history, patient was evaluated by physical therapist. No follow-up was recommended, no weakness was noted. CT of head was unremarkable, no further investigations were recommended DISCHARGE CONDITIONS:   Stable  CONSULTS OBTAINED:  Treatment Team:  Clapacs, [name], MD Erin Fulling, MD  DRUG ALLERGIES:   Allergies  Allergen Reactions  . Banana Hives    DISCHARGE MEDICATIONS:   Current Discharge Medication List    START taking these medications   Details  levofloxacin (LEVAQUIN) 500 MG tablet Take 1 tablet (500 mg total) by mouth daily. Qty: 5 tablet, Refills: 0    mupirocin ointment (BACTROBAN) 2 % Place 1 application into the nose 2 (two) times daily. Qty: 22 g, Refills: 0      CONTINUE these medications which have NOT CHANGED   Details  albuterol (PROVENTIL HFA;VENTOLIN HFA) 108 (90 Base) MCG/ACT inhaler Inhale 1-2 puffs into the lungs every 4 (four) hours as needed for  wheezing or shortness of breath. Qty: 1 Inhaler, Refills: 0    clobetasol ointment  (TEMOVATE) 0.05 % Apply 1 application topically 2 (two) times daily as needed. Refills: 0    traZODone (DESYREL) 100 MG tablet Take 100 mg by mouth at bedtime as needed for sleep.    triamcinolone cream (KENALOG) 0.1 % Apply topically 2 (two) times daily. Qty: 30 g, Refills: 0    ARIPiprazole (ABILIFY) 2 MG tablet Take 1 tablet (2 mg total) by mouth at bedtime. Qty: 30 tablet, Refills: 0    mirtazapine (REMERON) 15 MG tablet Take 1 tablet (15 mg total) by mouth at bedtime. Qty: 30 tablet, Refills: 0    QUEtiapine Fumarate (SEROQUEL XR) 150 MG 24 hr tablet Take 150 mg by mouth at bedtime.      STOP taking these medications     cephALEXin (KEFLEX) 500 MG capsule          DISCHARGE INSTRUCTIONS:    The patient is to follow-up with primary care physician and primary psychiatrist as outpatient  If you experience worsening of your admission symptoms, develop shortness of breath, life threatening emergency, suicidal or homicidal thoughts you must seek medical attention immediately by calling 911 or calling your MD immediately  if symptoms less severe.  You Must read complete instructions/literature along with all the possible adverse reactions/side effects for all the Medicines you take and that have been prescribed to you. Take any new Medicines after you have completely understood and accept all the possible adverse reactions/side effects.   Please note  You were cared for by a hospitalist during your hospital stay. If you have any questions about your discharge medications or the care you received while you were in the hospital after you are discharged, you can call the unit and asked to speak with the hospitalist on call if the hospitalist that took care of you is not available. Once you are discharged, your primary care physician will handle any further medical issues. Please note that NO REFILLS for any discharge medications will be authorized once you are discharged, as it is  imperative that you return to your primary care physician (or establish a relationship with a primary care physician if you do not have one) for your aftercare needs so that they can reassess your need for medications and monitor your lab values.    Today   CHIEF COMPLAINT:   Chief Complaint  Patient presents with  . Drug Overdose    HISTORY OF PRESENT ILLNESS:      VITAL SIGNS:  Blood pressure 114/76, pulse 75, temperature 98.2 F (36.8 C), temperature source Oral, resp. rate 18, height 5\' 6"  (1.676 m), weight 55.4 kg (122 lb 1.6 oz), last menstrual period 06/26/2017, SpO2 100 %.  I/O:   Intake/Output Summary (Last 24 hours) at 07/18/17 1555 Last data filed at 07/18/17 0431  Gross per 24 hour  Intake              460 ml  Output                0 ml  Net              460 ml    PHYSICAL EXAMINATION:  GENERAL:  26 y.o.-year-old patient lying in the bed with no acute distress.  EYES: Pupils equal, round, reactive to light and accommodation. No scleral icterus. Extraocular muscles intact.  HEENT: Head atraumatic, normocephalic. Oropharynx and nasopharynx clear.  NECK:  Supple, no jugular  venous distention. No thyroid enlargement, no tenderness.  LUNGS: Normal breath sounds bilaterally, no wheezing, rales,rhonchi or crepitation. No use of accessory muscles of respiration.  CARDIOVASCULAR: S1, S2 normal. No murmurs, rubs, or gallops.  ABDOMEN: Soft, non-tender, non-distended. Bowel sounds present. No organomegaly or mass.  EXTREMITIES: No pedal edema, cyanosis, or clubbing.  NEUROLOGIC: Cranial nerves II through XII are intact. Muscle strength 5/5 in all extremities. Sensation intact. Gait not checked.  PSYCHIATRIC: The patient is alert and oriented x 3.  SKIN: No obvious rash, lesion, or ulcer.   DATA REVIEW:   CBC  Recent Labs Lab 07/17/17 0323  WBC 11.6*  HGB 12.4  HCT 38.3  PLT 234    Chemistries   Recent Labs Lab 07/16/17 1025 07/16/17 1641 07/17/17 0323    NA 145  --  144  K 3.4*  --  3.6  CL 111  --  111  CO2 25  --  26  GLUCOSE 92  --  108*  BUN 15  --  12  CREATININE 0.75 0.77 0.71  CALCIUM 8.6*  --  8.8*  MG  --  2.3  --   AST 22  --   --   ALT 11*  --   --   ALKPHOS 48  --   --   BILITOT 1.4*  --   --     Cardiac Enzymes  Recent Labs Lab 07/16/17 1025  TROPONINI <0.03    Microbiology Results  Results for orders placed or performed during the hospital encounter of 07/16/17  Culture, respiratory (NON-Expectorated)     Status: None (Preliminary result)   Collection Time: 07/16/17  4:12 PM  Result Value Ref Range Status   Specimen Description TRACHEAL ASPIRATE  Final   Special Requests NONE  Final   Gram Stain   Final    ABUNDANT WBC PRESENT,BOTH PMN AND MONONUCLEAR FEW GRAM POSITIVE COCCI IN PAIRS RARE GRAM NEGATIVE COCCI IN PAIRS RARE GRAM NEGATIVE RODS    Culture   Final    CULTURE REINCUBATED FOR BETTER GROWTH Performed at [facility], [address] [place], [place] [address]    Report Status PENDING  Incomplete  MRSA PCR Screening     Status: Abnormal   Collection Time: 07/16/17  4:24 PM  Result Value Ref Range Status   MRSA by PCR POSITIVE (A) NEGATIVE Final    Comment:        The GeneXpert MRSA Assay (FDA approved for NASAL specimens only), is one component of a comprehensive MRSA colonization surveillance program. It is not intended to diagnose MRSA infection nor to guide or monitor treatment for MRSA infections. CRITICAL RESULT CALLED TO, READ BACK BY AND VERIFIED WITH: Harlon DittyJEREMIAH KEENE AT 1800 ON 07/16/2017 JJB     RADIOLOGY:  No results found.  EKG:   Orders placed or performed during the hospital encounter of 07/16/17  . EKG 12-Lead  . EKG 12-Lead  . EKG 12-Lead  . EKG 12-Lead  . EKG 12-Lead  . EKG 12-Lead  . EKG 12-Lead  . EKG 12-Lead      Management plans discussed with the patient, family and they are in agreement.  CODE STATUS:     Code Status Orders         Start     Ordered   07/16/17 1629  Full code  Continuous     07/16/17 1628    Code Status History    Date Active Date Inactive Code Status Order  ID Comments User Context   05/07/2017  3:48 PM 05/11/2017  9:06 PM Full Code [id number]  Audery Amel, MD Inpatient   11/05/2016  4:31 PM 11/12/2016  2:25 PM Full Code [id number]  Audery Amel, MD Inpatient   08/13/2016  8:07 PM 08/18/2016  4:21 PM Full Code [id number]  Audery Amel, MD Inpatient   07/21/2015 12:59 PM 07/21/2015  4:36 PM Full Code [id number]  Clapacs, [name], MD Inpatient      TOTAL TIME TAKING CARE OF THIS PATIENT: 40  minutes.   Discussed with Dr. Collene Gobble M.D on 07/18/2017 at 3:55 PM  Between 7am to 6pm - Pager - [phone]  After 6pm go to [email or web address] - password [email or web address] [facility]  [facility]  Office  [phone]  CC: Primary care physician; [facility], [facility]

## 2017-07-18 NOTE — Tx Team (Signed)
Initial Treatment Plan 07/18/2017 7:28 PM Bridget [place] Karie KirksShayan Hautala ZOX:[id number]    PATIENT STRESSORS: Substance abuse   PATIENT STRENGTHS: General fund of knowledge Supportive family/friends   PATIENT IDENTIFIED PROBLEMS:   Bipolar 1 Disorder Mixed    Depression    History of suicide attempts           DISCHARGE CRITERIA:  Improved stabilization in mood, thinking, and/or behavior Motivation to continue treatment in a less acute level of care Verbal commitment to aftercare and medication compliance  PRELIMINARY DISCHARGE PLAN: Return to previous living arrangement  PATIENT/FAMILY INVOLVEMENT: This treatment plan has been presented to and reviewed with the patient, Bridget Carpenter.  The patient and family have been given the opportunity to ask questions and make suggestions.  Iantha Falleniffaney L Ovie Cornelio, RN 07/18/2017, 7:28 PM

## 2017-07-18 NOTE — Progress Notes (Signed)
Patient is coming to behavioral health and was noted she had a positive MRSA in Nares. [facility] and nursing director of [facility] was notified and instructed Charge nurse to call infection control. Infection control was notified and Charlynne PanderCara stated patients with colonizations in nares is acceptable on behavioral health unit as long as there in no open wounds or infections patient is being treated for. Patient is cleared to be admitted to the BMU.

## 2017-07-18 NOTE — Evaluation (Signed)
Physical Therapy Evaluation and Discharge Patient Details Name: Bridget Carpenter C Signore MRN: [id number] DOB: Mar 25, 1991 Today's Date: 07/18/2017   History of Present Illness  Pt is a 26 y/o F who presented after intentionally overdosing.  Pt is well known to the psychiatry department and has had multiple hospitalizations for psychiatric reasons in the past.  Pt's PMH includes depression, dissociative disorder or reaction, suicide attempt, oppositional defiant disorder, PTSD, schizoaffective disorder, substance abuse.    Clinical Impression  Pt admitted with above diagnosis. Bridget Carpenter is independent with all aspects of mobility at baseline and lives with her mother. Suspect that mild balance deficits as demonstrated by a 53/56 on the Sharlene Motts is a result of the overdose, anticipate that this will improve as pt continues to improve medically.  Pt ambulated 40 ft, performed transfer, and bed mobility with no signs of instability.  No skilled PT needs identified, PT will sign off.    Follow Up Recommendations No PT follow up    Equipment Recommendations  None recommended by PT    Recommendations for Other Services       Precautions / Restrictions Precautions Precautions: Fall Restrictions Weight Bearing Restrictions: No      Mobility  Bed Mobility Overal bed mobility: Independent             General bed mobility comments: Independent.  No assist or cues needed.  Transfers Overall transfer level: Independent Equipment used: None             General transfer comment: Independent.  No assist or cues needed.  No signs of instability.  Ambulation/Gait Ambulation/Gait assistance: Independent Ambulation Distance (Feet): 40 Feet Assistive device: None Gait Pattern/deviations: WFL(Within Functional Limits)   Gait velocity interpretation: at or above normal speed for age/gender General Gait Details: No instabilty noted.  Pt independent.  No assist or cues needed.  Stairs             Wheelchair Mobility    Modified Rankin (Stroke Patients Only)       Balance Overall balance assessment: Modified Independent                               Standardized Balance Assessment Standardized Balance Assessment : Berg Balance Test Berg Balance Test Sit to Stand: Able to stand without using hands and stabilize independently Standing Unsupported: Able to stand safely 2 minutes Sitting with Back Unsupported but Feet Supported on Floor or Stool: Able to sit safely and securely 2 minutes Stand to Sit: Sits safely with minimal use of hands Transfers: Able to transfer safely, minor use of hands Standing Unsupported with Eyes Closed: Able to stand 10 seconds safely Standing Ubsupported with Feet Together: Able to place feet together independently and stand for 1 minute with supervision From Standing, Reach Forward with Outstretched Arm: Can reach confidently >25 cm (10") From Standing Position, Pick up Object from Floor: Able to pick up shoe safely and easily From Standing Position, Turn to Look Behind Over each Shoulder: Looks behind from both sides and weight shifts well Turn 360 Degrees: Able to turn 360 degrees safely in 4 seconds or less Standing Unsupported, Alternately Place Feet on Step/Stool: Able to stand independently and safely and complete 8 steps in 20 seconds Standing Unsupported, One Foot in Front: Able to plae foot ahead of the other independently and hold 30 seconds Standing on One Leg: Able to lift leg independently and hold 5-10 seconds Total Score: 53  Pertinent Vitals/Pain Pain Assessment: Faces Faces Pain Scale: Hurts a little bit Pain Location: BLEs (since overdose, RN notified) Pain Descriptors / Indicators: Discomfort Pain Intervention(s): Limited activity within patient's tolerance;Monitored during session    Home Living Family/patient expects to be discharged to:: Private residence Living Arrangements: Parent Available  Help at Discharge: Family;Available PRN/intermittently Type of Home: House Home Access: Level entry     Home Layout: Two level;Bed/bath upstairs Home Equipment: None      Prior Function Level of Independence: Independent         Comments: Pt indepedent at baseline with all aspects of mobility, ADLs, IADLs.  Denies any falls in the past 6 months.       Hand Dominance        Extremity/Trunk Assessment   Upper Extremity Assessment Upper Extremity Assessment: Overall WFL for tasks assessed    Lower Extremity Assessment Lower Extremity Assessment: Overall WFL for tasks assessed    Cervical / Trunk Assessment Cervical / Trunk Assessment: Normal  Communication   Communication: No difficulties  Cognition Arousal/Alertness: Awake/alert Behavior During Therapy: WFL for tasks assessed/performed Overall Cognitive Status: Within Functional Limits for tasks assessed                                        General Comments General comments (skin integrity, edema, etc.): Suspect that mild balance deficits as demonstrated by a 53/56 on the Sharlene MottsBerg is a result of the overdose, anticipate that this will improve as pt continues to improve medically.    Exercises     Assessment/Plan    PT Assessment Patent does not need any further PT services  PT Problem List         PT Treatment Interventions      PT Goals (Current goals can be found in the Care Plan section)  Acute Rehab PT Goals Patient Stated Goal: to go home PT Goal Formulation: With patient    Frequency     Barriers to discharge        Co-evaluation               AM-PAC PT "6 Clicks" Daily Activity  Outcome Measure Difficulty turning over in bed (including adjusting bedclothes, sheets and blankets)?: None Difficulty moving from lying on back to sitting on the side of the bed? : None Difficulty sitting down on and standing up from a chair with arms (e.g., wheelchair, bedside commode,  etc,.)?: None Help needed moving to and from a bed to chair (including a wheelchair)?: None Help needed walking in hospital room?: None Help needed climbing 3-5 steps with a railing? : None 6 Click Score: 24    End of Session   Activity Tolerance: Patient tolerated treatment well Patient left: Other (comment);with nursing/sitter in room (standing at bedside with sitter in room) Nurse Communication: Mobility status PT Visit Diagnosis: Unsteadiness on feet (R26.81)    Time: [id number] PT Time Calculation (min) (ACUTE ONLY): 11 min   Charges:   PT Evaluation $PT Eval Low Complexity: 1 Procedure     PT G CodesEncarnacion Chu:        Ashley Abashian PT, DPT 07/18/2017, 2:07 PM

## 2017-07-18 NOTE — Plan of Care (Signed)
Pt is being d/ced and transferred to Behavioral Med.  IVC papers have been completed by Dr. Edwin DadaKapoor.  PT has signed off on patient - feels like she is steady and able to be transferred.  Pt has c/o of L leg pain.  IV removed - and pt started on PO abx.  Pt has had safety sitter all day.  Security and I will transport patient downstairs. Gave report to Neff[place]unny.

## 2017-07-18 NOTE — Progress Notes (Signed)
[facility] MD Progress Note  07/18/2017 4:54 PM Bridget Carpenter  MRN:  [id number] Subjective:  Patient is a 26 year old single African-American female from [place]. She is well-known to our service as she has been admitted a multitude of times. She carries a diagnosis of depression, cannabis-cocaine dependence and borderline personality disorder. The patient was admitted to medicine S/P overdose on Risperdal requiring intubation.  Principal Problem: Overdose Diagnosis:   Patient Active Problem List   Diagnosis Date Noted  . 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 Overdose [T50.901A] 07/16/2017  . Aspiration pneumonia ([place]) [J69.0] 07/16/2017  . Acute respiratory failure ([place]) [J96.00] 07/16/2017  . Polysubstance abuse [F19.10] 07/16/2017  . Eczema [L30.9] 05/08/2017  . Borderline personality disorder [F60.3] 11/06/2016  . Cocaine use disorder, severe, dependence ([place]) [F14.20] 11/06/2016  . Cannabis use disorder, moderate, dependence ([place]) [F12.20] 11/06/2016  . Alcohol use disorder, mild, abuse [F10.10] 11/06/2016  . Major depressive disorder, recurrent severe without psychotic features ([place]) [F33.2] 11/06/2016  . Asthma [J45.909] 09/23/2011  . Tobacco use disorder [F17.200] 09/15/2009   Total Time spent with patient: 30 minutes  Past Psychiatric History: Multiple hospitalization due to suicidality in the setting of intoxication with cocaine along with some psychotic symptoms. Says she has been hospitalized about 20 times. She stated that she has tried in the past to jump in front of cars, jumping out of cars. She also states she has overdosed on medications in the past with the attempt to kill herself. The patient reports history of self injury by cutting.  As far as diagnosis says that she has been diagnosed in the past with schizophrenia, and bipolar. Per the chart that she has been given diagnosis of ADHD and oppositional defiant disorder as a child along with issues  related to substance abuse.  Not currently on any medications and not seeing a psychiatrist  Per chart she has a long history of noncompliance with outpatient treatment or medications prescribed in the past.  Past Medical History:  Past Medical History:  Diagnosis Date  . Asthma   . Depression 2009   Inpatient psych admission for SI, dissociative fugue  . Dissociative disorder or reaction 2009  . Eczema   . H/O: suicide attempt   . ODD (oppositional defiant disorder)   . PTSD (post-traumatic stress disorder)   . Schizoaffective disorder ([place])   . Substance abuse     Past Surgical History:  Procedure Laterality Date  . ADENOIDECTOMY    . TONSILLECTOMY     Family History:  Family History  Problem Relation Age of Onset  . Adopted: Yes   Family Psychiatric  History: Her biological mother has a history of a unknown type of mental illness.   Social History: She lives with her adoptive mother and her 90-year-old daughter. She has Medicaid.Patient states she is currently unemployed. During her past admission in November she was on parole says she is not longer on parole History  Alcohol Use  . Yes    Comment: sometimes     History  Drug Use No    Social History   Social History  . Marital status: Single    Spouse name: N/A  . Number of children: N/A  . Years of education: N/A   Social History Main Topics  . Smoking status: Former Smoker    Packs/day: 0.50    Years: 2.00    Types: Cigarettes  . Smokeless tobacco: Never Used  . Alcohol use Yes  Comment: sometimes  . Drug use: No  . Sexual activity: Yes   Other Topics Concern  . None   Social History Narrative  . None     Current Medications: Current Facility-Administered Medications  Medication Dose Route Frequency Provider Last Rate Last Dose  . acetaminophen (TYLENOL) tablet 650 mg  650 mg Oral Q6H PRN Lance Coon, MD   650 mg at 07/18/17 4496   Or  . acetaminophen (TYLENOL) suppository 650 mg   650 mg Rectal Q6H PRN Lance Coon, MD      . Chlorhexidine Gluconate Cloth 2 % PADS 6 each  6 each Topical Q0600 Awilda Bill, NP   6 each at 07/18/17 [phone]  . enoxaparin (LOVENOX) injection 40 mg  40 mg Subcutaneous Q24H Lance Coon, MD   40 mg at 07/17/17 1801  . levofloxacin (LEVAQUIN) tablet 500 mg  500 mg Oral Daily [place], Rima, MD      . mupirocin ointment (BACTROBAN) 2 % 1 application  1 application Nasal BID Awilda Bill, NP   1 application at 63/84/66 1151  . ondansetron (ZOFRAN) tablet 4 mg  4 mg Oral Q6H PRN Lance Coon, MD       Or  . ondansetron [facility]) injection 4 mg  4 mg Intravenous Q6H PRN Lance Coon, MD        Lab Results:  Results for orders placed or performed during the hospital encounter of 07/16/17 (from the past 48 hour(s))  Glucose, capillary     Status: None   Collection Time: 07/16/17 11:38 PM  Result Value Ref Range   Glucose-Capillary 77 65 - 99 mg/dL   Comment 1 Notify RN    Comment 2 Document in Chart   Acetaminophen level     Status: Abnormal   Collection Time: 07/17/17  3:23 AM  Result Value Ref Range   Acetaminophen (Tylenol), Serum <10 (L) 10 - 30 ug/mL    Comment:        THERAPEUTIC CONCENTRATIONS VARY SIGNIFICANTLY. A RANGE OF 10-30 ug/mL MAY BE AN EFFECTIVE CONCENTRATION FOR MANY PATIENTS. HOWEVER, SOME ARE BEST TREATED AT CONCENTRATIONS OUTSIDE THIS RANGE. ACETAMINOPHEN CONCENTRATIONS >150 ug/mL AT 4 HOURS AFTER INGESTION AND >50 ug/mL AT 12 HOURS AFTER INGESTION ARE OFTEN ASSOCIATED WITH TOXIC REACTIONS.   Salicylate level     Status: None   Collection Time: 07/17/17  3:23 AM  Result Value Ref Range   Salicylate Lvl <5.9 2.8 - 30.0 mg/dL  Basic metabolic panel     Status: Abnormal   Collection Time: 07/17/17  3:23 AM  Result Value Ref Range   Sodium 144 135 - 145 mmol/L   Potassium 3.6 3.5 - 5.1 mmol/L   Chloride 111 101 - 111 mmol/L   CO2 26 22 - 32 mmol/L   Glucose, Bld 108 (H) 65 - 99 mg/dL   BUN 12 6 - 20  mg/dL   Creatinine, Ser 0.71 0.44 - 1.00 mg/dL   Calcium 8.8 (L) 8.9 - 10.3 mg/dL   GFR calc non Af Amer >60 >60 mL/min   GFR calc Af Amer >60 >60 mL/min    Comment: (NOTE) The eGFR has been calculated using the CKD EPI equation. This calculation has not been validated in all clinical situations. eGFR's persistently <60 mL/min signify possible Chronic Kidney Disease.    Anion gap 7 5 - 15  CBC with Differential/Platelet     Status: Abnormal   Collection Time: 07/17/17  3:23 AM  Result Value Ref Range  WBC 11.6 (H) 3.6 - 11.0 K/uL   RBC 4.08 3.80 - 5.20 MIL/uL   Hemoglobin 12.4 12.0 - 16.0 g/dL   HCT 38.3 35.0 - 47.0 %   MCV 93.9 80.0 - 100.0 fL   MCH 30.4 26.0 - 34.0 pg   MCHC 32.4 32.0 - 36.0 g/dL   RDW 13.9 11.5 - 14.5 %   Platelets 234 150 - 440 K/uL   Neutrophils Relative % 83 %   Neutro Abs 9.6 (H) 1.4 - 6.5 K/uL   Lymphocytes Relative 9 %   Lymphs Abs 1.1 1.0 - 3.6 K/uL   Monocytes Relative 7 %   Monocytes Absolute 0.8 0.2 - 0.9 K/uL   Eosinophils Relative 1 %   Eosinophils Absolute 0.2 0 - 0.7 K/uL   Basophils Relative 0 %   Basophils Absolute 0.0 0 - 0.1 K/uL  Procalcitonin     Status: None   Collection Time: 07/17/17  3:23 AM  Result Value Ref Range   Procalcitonin <0.10 ng/mL    Comment:        Interpretation: PCT (Procalcitonin) <= 0.5 ng/mL: Systemic infection (sepsis) is not likely. Local bacterial infection is possible. (NOTE)         ICU PCT Algorithm               Non ICU PCT Algorithm    ----------------------------     ------------------------------         PCT < 0.25 ng/mL                 PCT < 0.1 ng/mL     Stopping of antibiotics            Stopping of antibiotics       strongly encouraged.               strongly encouraged.    ----------------------------     ------------------------------       PCT level decrease by               PCT < 0.25 ng/mL       >= 80% from peak PCT       OR PCT 0.25 - 0.5 ng/mL          Stopping of antibiotics                                              encouraged.     Stopping of antibiotics           encouraged.    ----------------------------     ------------------------------       PCT level decrease by              PCT >= 0.25 ng/mL       < 80% from peak PCT        AND PCT >= 0.5 ng/mL            Continuin g antibiotics                                              encouraged.       Continuing antibiotics            encouraged.    ----------------------------     ------------------------------  PCT level increase compared          PCT > 0.5 ng/mL         with peak PCT AND          PCT >= 0.5 ng/mL             Escalation of antibiotics                                          strongly encouraged.      Escalation of antibiotics        strongly encouraged.   Glucose, capillary     Status: Abnormal   Collection Time: 07/17/17  7:33 AM  Result Value Ref Range   Glucose-Capillary 115 (H) 65 - 99 mg/dL   Comment 1 Notify RN   Glucose, capillary     Status: None   Collection Time: 07/17/17  4:48 PM  Result Value Ref Range   Glucose-Capillary 79 65 - 99 mg/dL   Comment 1 Notify RN   Glucose, capillary     Status: None   Collection Time: 07/17/17 11:58 PM  Result Value Ref Range   Glucose-Capillary 65 65 - 99 mg/dL  Glucose, capillary     Status: Abnormal   Collection Time: 07/18/17 12:51 AM  Result Value Ref Range   Glucose-Capillary 131 (H) 65 - 99 mg/dL  Procalcitonin     Status: None   Collection Time: 07/18/17  5:23 AM  Result Value Ref Range   Procalcitonin <0.10 ng/mL    Comment:        Interpretation: PCT (Procalcitonin) <= 0.5 ng/mL: Systemic infection (sepsis) is not likely. Local bacterial infection is possible. (NOTE)         ICU PCT Algorithm               Non ICU PCT Algorithm    ----------------------------     ------------------------------         PCT < 0.25 ng/mL                 PCT < 0.1 ng/mL     Stopping of antibiotics            Stopping of antibiotics        strongly encouraged.               strongly encouraged.    ----------------------------     ------------------------------       PCT level decrease by               PCT < 0.25 ng/mL       >= 80% from peak PCT       OR PCT 0.25 - 0.5 ng/mL          Stopping of antibiotics                                             encouraged.     Stopping of antibiotics           encouraged.    ----------------------------     ------------------------------       PCT level decrease by              PCT >= 0.25 ng/mL       <  80% from peak PCT        AND PCT >= 0.5 ng/mL            Continuin g antibiotics                                              encouraged.       Continuing antibiotics            encouraged.    ----------------------------     ------------------------------     PCT level increase compared          PCT > 0.5 ng/mL         with peak PCT AND          PCT >= 0.5 ng/mL             Escalation of antibiotics                                          strongly encouraged.      Escalation of antibiotics        strongly encouraged.   Glucose, capillary     Status: Abnormal   Collection Time: 07/18/17  8:17 AM  Result Value Ref Range   Glucose-Capillary 106 (H) 65 - 99 mg/dL  Glucose, capillary     Status: Abnormal   Collection Time: 07/18/17  4:22 PM  Result Value Ref Range   Glucose-Capillary 109 (H) 65 - 99 mg/dL    Blood Alcohol level:  Lab Results  Component Value Date   ETH <5 07/16/2017   ETH <5 01/74/9449    Metabolic Disorder Labs: Lab Results  Component Value Date   HGBA1C 5.1 11/06/2016   MPG 100 11/06/2016   Lab Results  Component Value Date   PROLACTIN 21.5 11/06/2016   PROLACTIN 49.4 (H) 08/13/2016   Lab Results  Component Value Date   CHOL 133 11/06/2016   TRIG 42 07/16/2017   HDL 84 11/06/2016   CHOLHDL 1.6 11/06/2016   VLDL 16 11/06/2016   LDLCALC 33 11/06/2016   LDLCALC 32 08/13/2016    Physical Findings: AIMS:  , ,  ,  ,    CIWA:    COWS:      Musculoskeletal: Strength & Muscle Tone: within normal limits Gait & Station: normal Patient leans: N/A  Psychiatric Specialty Exam: Physical Exam  Constitutional: She is oriented to person, place, and time. She appears well-developed and well-nourished.  HENT:  Head: Normocephalic and atraumatic.  Eyes: Conjunctivae and EOM are normal.  Neck: Normal range of motion.  Respiratory: Effort normal.  Musculoskeletal: Normal range of motion.  Neurological: She is alert and oriented to person, place, and time.    Review of Systems  Constitutional: Negative.   HENT: Negative.   Eyes: Negative.   Respiratory: Negative.   Cardiovascular: Negative.   Gastrointestinal: Negative.   Genitourinary: Negative.   Musculoskeletal: Negative.   Skin: Negative.   Neurological: Negative.   Endo/Heme/Allergies: Negative.   Psychiatric/Behavioral: Positive for substance abuse. Negative for depression, hallucinations, memory loss and suicidal ideas. The patient is not nervous/anxious and does not have insomnia.     Blood pressure 114/76, pulse 75, temperature 98.2 F (36.8 C), temperature source Oral, resp. rate 18, height '5\' 6"'$  (1.676 m),  weight 55.4 kg (122 lb 1.6 oz), last menstrual period 06/26/2017, SpO2 100 %.Body mass index is 19.71 kg/m.  General Appearance: Disheveled  Eye Contact:  Minimal  Speech:  Slow  Volume:  Decreased  Mood:  Irritable  Affect:  Congruent  Thought Process:  Linear and Descriptions of Associations: Intact  Orientation:  Full (Time, Place, and Person)  Thought Content:  Hallucinations: None  Suicidal Thoughts:  No  Homicidal Thoughts:  No  Memory:  Immediate;   Fair Recent;   Fair Remote;   Fair  Judgement:  Poor  Insight:  Shallow  Psychomotor Activity:  Decreased  Concentration:  Concentration: Fair and Attention Span: Fair  Recall:  [employer] of Knowledge:  Fair  Language:  Good  Akathisia:  No  Handed:    AIMS (if indicated):     Assets:   Communication Skills Physical Health  ADL's:  Intact  Cognition:  WNL  Sleep:        Treatment Plan Summary:  Ms. Adolph is a 26 year old female with history of depression, mood instability, and substance abuse admitted to the medicine floor S/P Overdose. The patient does have a history of a mood disorder and has had multiple overdose in the past. She has been hospitalized at least twice this year are ready and was hospitalized earlier this month at [facility]. At this time, she is an imminent danger to herself or others secondary to impulsive behavior and not being stabilized on medication. She has a long history of noncompliance.   At this time, will plan to transfer the patient to inpatient psychiatry for medication management, safety and stabilization. She has been cleared from the medicine service per Dr. Ether Griffins.Marland Kitchen She will be placed under involuntary commitment prior to transfer. Please keep with a one-to-one sitter until the time of transfer. Due to overdose, will hold psychotropic medications until tomorrow.

## 2017-07-18 NOTE — Clinical Social Work Note (Signed)
CSW attempted to contact the patient's mother per the patient's mother's request. No answer, CSW left voicemail and is awaiting a return call. CSW will continue to follow.  Argentina PonderKaren Martha Serra Younan, MSW, Theresia MajorsLCSWA [phone]
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
## 2017-07-19 DIAGNOSIS — F1220 Cannabis dependence, uncomplicated: Secondary | ICD-10-CM

## 2017-07-19 DIAGNOSIS — L309 Dermatitis, unspecified: Secondary | ICD-10-CM

## 2017-07-19 DIAGNOSIS — F3162 Bipolar disorder, current episode mixed, moderate: Principal | ICD-10-CM

## 2017-07-19 DIAGNOSIS — F1420 Cocaine dependence, uncomplicated: Secondary | ICD-10-CM

## 2017-07-19 LAB — LIPID PANEL
CHOL/HDL RATIO: 2 ratio
CHOLESTEROL: 136 mg/dL (ref 0–200)
HDL: 67 mg/dL (ref >40)
LDL Cholesterol: 54 mg/dL (ref 0–99)
Triglycerides: 74 mg/dL (ref <150)
VLDL: 15 mg/dL (ref 0–40)

## 2017-07-19 LAB — CULTURE, RESPIRATORY W GRAM STAIN: Culture: NORMAL

## 2017-07-19 LAB — TSH: TSH: 2.496 u[IU]/mL (ref 0.350–4.500)

## 2017-07-19 LAB — CULTURE, RESPIRATORY

## 2017-07-19 MED ORDER — PALIPERIDONE ER 3 MG PO TB24
3.0000 mg | ORAL_TABLET | Freq: Every day | ORAL | Status: DC
  Administered 2017-07-19 – 2017-07-20 (×2): 3 mg via ORAL
  Filled 2017-07-19 (×2): qty 1

## 2017-07-19 MED ORDER — CLOBETASOL PROPIONATE 0.05 % EX OINT
1.0000 "application " | TOPICAL_OINTMENT | Freq: Two times a day (BID) | CUTANEOUS | Status: DC | PRN
  Filled 2017-07-19: qty 15

## 2017-07-19 MED ORDER — TRIAMCINOLONE ACETONIDE 0.1 % EX CREA
TOPICAL_CREAM | Freq: Two times a day (BID) | CUTANEOUS | Status: DC
  Administered 2017-07-21 – 2017-07-22 (×2): via TOPICAL
  Filled 2017-07-19 (×2): qty 15

## 2017-07-19 NOTE — Progress Notes (Signed)
Pt was given Maalox , then pt informed writer" I'm allergic to all medications". Pt was asked why she wasn't allergic to the Maalox she just took, pt stated " I could take that because it was liquid" pt was asked if her medications were liquid would she take them, pt responded "yes" .

## 2017-07-19 NOTE — [facility] Suicide Risk Assessment (Signed)
[facility] INPATIENT:  Family/Significant Other Suicide Prevention Education  Suicide Prevention Education:  Patient Refusal for Family/Significant Other Suicide Prevention Education: The patient [name] Karie KirksShayan [place] has refused to provide written consent for family/significant other to be provided Family/Significant Other Suicide Prevention Education during admission and/or prior to discharge.  Physician notified.  The Suicide Prevention Education brochure was reviewed with patient, and she stated without being asked that she would share this with her mother.  Carloyn JaegerMareida J Grossman-Orr 07/19/2017, 11:49 AM

## 2017-07-19 NOTE — [facility] Group Notes (Signed)
[facility] Group Notes:  (Nursing/MHT/Case Management/Adjunct)  Date:  07/19/2017  Time:  7:19 AM  Type of Therapy:  Psychoeducational Skills  Participation Level:  Did Not Attend  Summary of Progress/Problems:  Bridget MilroyLaquanda Carpenter Bridget Carpenter 07/19/2017, 7:19 AM

## 2017-07-19 NOTE — [facility] Suicide Risk Assessment (Signed)
[facility] Admission Suicide Risk Assessment   Nursing information obtained from:  Patient Demographic factors:  Low socioeconomic status Current Mental Status:  NA Loss Factors:  NA Historical Factors:  Prior suicide attempts Risk Reduction Factors:  Sense of responsibility to family  Total Time spent with patient: 45 minutes Principal Problem: Bipolar Disorder, Mixed Type  Diagnosis:   Patient Active Problem List   Diagnosis Date Noted  . Moderate mixed bipolar I disorder (HCC) [F31.62] 07/18/2017    Priority: High  . Cocaine use disorder, severe, dependence (HCC) [F14.20] 11/06/2016    Priority: High  . Cannabis use disorder, moderate, dependence (HCC) [F12.20] 11/06/2016    Priority: High  . Eczema [L30.9] 05/08/2017    Priority: Low  . 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 Overdose [T50.901A] 07/16/2017  . Aspiration pneumonia (HCC) [J69.0] 07/16/2017  . Acute respiratory failure (HCC) [J96.00] 07/16/2017  . Polysubstance abuse [F19.10] 07/16/2017  . Borderline personality disorder [F60.3] 11/06/2016  . Alcohol use disorder, mild, abuse [F10.10] 11/06/2016  . Major depressive disorder, recurrent severe without psychotic features (HCC) [F33.2] 11/06/2016  . Asthma [J45.909] 09/23/2011  . Tobacco use disorder [F17.200] 09/15/2009   Subjective Data:  Mr. Gable is a 26 year old African-American female with a history of schizoaffective disorder very well known to [facility] inpatient psychiatry she has had multiple prior inpatient psychiatric hospitalizations. She was admitted to the medicine service on July 19 after she overdosed on possibly Seroquel and trazodone. The patient herself believes that she overdosed on Risperdal the records report Seroquel and trazodone. The patient says she is not sure. She was intubated and admitted to the ICU. The patient is now status post extubation. The patient was very vague about the overdose and stated that she did not  believe that she overdosed but actually had an allergic reaction to the medications which caused her to be intubated. She admits to noncompliance with psychotropic medications prior to admission. While on the medicine service, the patient was irritable and argumentative. She was not willing to be compliant with any psychotropic medications. Insight and judgment remain poor. She denies that the overdose was a suicide attempt and is denying any current suicidal thoughts. Affect however is mildly labile and the patient appears mildly euphoric at times. She does endorse racing thoughts and decreased sleep at night. She denies any auditory or visual hallucinations. No paranoid thoughts or delusions. She denies any heavy alcohol use but has been abusing multiple drugs including methamphetamines, cocaine and marijuana. She denies any heavy alcohol use.  Past Psychiatric History:   The patient has a history of multiple prior inpatient psychiatric hospitalizations due to suicidality in the context of polysubstance abuse and psychosis. She has been hospitalized over 20 times and was just released from [facility]. She has had multiple suicide attempts including trying to jump in front of cars, jumping out of cars. She also states she has overdosed on medications in the past with the attempt to kill herself. The patient reports history of self injury by cutting.   As far as diagnosis says that she has been diagnosed in the past with schizophrenia, and bipolar. Per the chart that she has been given diagnosis of ADHD and oppositional defiant disorder as a child along with issues related to substance abuse.   The patient is supposed to be followed at [facility] but is unclear whether not she is compliant.  Substance Abuse History: Toxicology screen was positive for methamphetamines, cocaine and marijuana. The patient has a  long history of polysubstance abuse. She denies any heavy alcohol use. The patient says she quit smoking  several months ago but prior to that smoked on and off since her mid teens.   Family Psychiatric  History: Her biological mother has a history of a unknown type of mental illness.     Social History: The patient was adopted and has been raised by her adopted mother. She has one 21-year-old daughter. The patient has a 10th grade education. She is currently unemployed and on disability. 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 She is dating someone and has a boyfriend that has never been married. She lives with her adoptive mother and her 67-year-old daughter. She has Medicaid.  Legal history: The patient says she has been arrested multiple times in the past for drug charges as well as larceny. She denies any current pending charges.  Continued Clinical Symptoms:    The "Alcohol Use Disorders Identification Test", Guidelines for Use in Primary Care, Second Edition.  World Science writer [facility]). Score between 0-7:  no or low risk or alcohol related problems. Score between 8-15:  moderate risk of alcohol related problems. Score between 16-19:  high risk of alcohol related problems. Score 20 or above:  warrants further diagnostic evaluation for alcohol dependence and treatment.   CLINICAL FACTORS:   Bipolar Disorder:   Mixed State; Polysubstance use   Musculoskeletal: Strength & Muscle Tone: within normal limits Gait & Station: normal Patient leans: N/A  Psychiatric Specialty Exam: Physical Exam: See H+P  ROS: See H+P  Blood pressure (!) 111/58, pulse 81, temperature 97.6 F (36.4 C), temperature source Oral, resp. rate 16, height 5\' 5"  (1.651 m), weight 55.3 kg (122 lb), last menstrual period 06/26/2017, SpO2 100 %.Body mass index is 20.3 kg/m.      MSE: See H+P                                                    COGNITIVE FEATURES THAT CONTRIBUTE TO RISK:  Mixed state of Bipolar disorder Susbtance use  SUICIDE RISK:   Moderate:  Frequent suicidal  ideation with limited intensity, and duration, some specificity in terms of plans, no associated intent, good self-control, limited dysphoria/symptomatology, some risk factors present, and identifiable protective factors, including available and accessible social support. The patient denies access to guns Chronic long-term risk of suicide remains elevated secondary to noncompliance with medications as well as poor insight and judgment. In addition, the patient does abuse substances heavily.   PLAN OF CARE:   Diagnosis: Schizoaffective disorder, bipolar type Cocaine use disorder, severe Methamphetamine use disorder, severe Cannabis use disorder, severe Aspiration pneumonitis MRSA carrier Eczema Severe: Chronic mental illness  Ms. Marquard is a 26 year old African-American female with history of schizoaffective disorder, bipolar type overdosed and was admitted to the ICU requiring intubation. Now that she is extubated, she will be admitted to inpatient psychiatry for medication management, safety and stabilization and the patient was mildly labile on the medicine floor and noncompliant with medications. She will be admitted for inpatient psychiatric hospitalization, medication management and stabilization as well as safety.  Schizoaffective disorder, bipolar type, we'll plan to restart Remeron 15 mg by mouth nightly for insomnia, anxiety and depression as well as Depakote ER 750 mg by mouth nightly. Will have to check valproic acid level and LFTs  before titrating of Depakote. The patient will be offered Invega 3 mg by mouth nightly with a plan to transition to an vague injection. The patient says she very much prefers an injection over taking oral medications daily. EKG was checked to rule out QTc prolongation. We'll check hemoglobin A1c and lipid panel.  Cocaine use disorder, cannabis use disorder, methamphetamine use disorder: Toxicology screen was positive for all substances. She was advised to  abstain from alcohol and also drugs as they may worsen mood symptoms. The patient very much needs residential substance abuse treatment. She should be referred to [facility].   Aspiration pneumonitis with acute respiratory failure requiring intubation due to altered mental status: Ccontinue patient on levofloxacin for next 5 days to complete course  MRSA carrier, continue mupirocin for the next 4 days to complete course. No need for contact precautions per infection control of the hospital.  Eczema: Will continue Triamcinolo nReviewe and Clobetasol cream to hands.  Social work to contact CPS given noncompliance as well as substance abuse. She does have custody of her 429-year-old daughter. This is her third psychiatric hospitalization within the past 3 months.  Tobacco use disorder: The patient has declined a nicotine patch and says she stopped smoking.  Disposition: The patient does have a stable living situation. Psychotropic medication management follow-up appointment will be scheduled with [facility].   I certify that inpatient services furnished can reasonably be expected to improve the patient's condition.   Levora AngelKAPUR,Sanav Remer KAMAL, MD 07/19/2017, 12:15 PM

## 2017-07-19 NOTE — H&P (Signed)
Psychiatric Admission Assessment Adult  Patient Identification: [name] MRN:  [id number] Date of Evaluation:  07/19/2017 Chief Complaint: "I don't need to be here"  Principal Diagnosis: Bipolar Disorder, Mixed  Diagnosis:   Patient Active Problem List   Diagnosis Date Noted  . 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 Moderate mixed bipolar I disorder (HCC) [F31.62] 07/18/2017  . Overdose [T50.901A] 07/16/2017  . Aspiration pneumonia (HCC) [J69.0] 07/16/2017  . Acute respiratory failure (HCC) [J96.00] 07/16/2017  . Polysubstance abuse [F19.10] 07/16/2017  . Eczema [L30.9] 05/08/2017  . Borderline personality disorder [F60.3] 11/06/2016  . Cocaine use disorder, severe, dependence (HCC) [F14.20] 11/06/2016  . Cannabis use disorder, moderate, dependence (HCC) [F12.20] 11/06/2016  . Alcohol use disorder, mild, abuse [F10.10] 11/06/2016  . Major depressive disorder, recurrent severe without psychotic features (HCC) [F33.2] 11/06/2016  . Asthma [J45.909] 09/23/2011  . Tobacco use disorder [F17.200] 09/15/2009   History of Present Illness:  Bridget Carpenter is a 26 year old African-American female with a history of schizoaffective disorder very well known to [facility] inpatient psychiatry she has had multiple prior inpatient psychiatric . She was admitted to the medicine service on July 19 after she overdosed on possibly Seroquel and trazodone. The patient herself believes that she overdosed on Risperdal the records report Seroquel and trazodone. The patient says she is not sure. She was intubated and admitted to the ICU. The patient is now status post extubation. The patient was very vague about the overdose and stated that she did not believe that she overdosed but actually had an allergic reaction to the medications which caused her to be intubated. She admits to noncompliance with psychotropic medications prior to admission. She was just discharged from [facility]  earlier this month after a 2 week stay. While on the medicine service, the patient was irritable and argumentative. She was not willing to be compliant with any psychotropic medications. Insight and judgment remain poor. She denies that the overdose was a suicide attempt and is denying any current suicidal thoughts. Affect however is mildly labile and the patient appears mildly euphoric at times. She does endorse racing thoughts and decreased sleep at night. She denies any auditory or visual hallucinations. No paranoid thoughts or delusions. She denies any heavy alcohol use but has been abusing multiple drugs including methamphetamines, cocaine and marijuana. She denies any heavy alcohol use.   Past Psychiatric History:  The patient has a history of multiple prior inpatient psychiatric hospitalizations due to suicidality in the context of polysubstance abuse and psychosis. She has been hospitalized over 20 times and was just released from [facility]. She has had multiple suicide attempts including trying to jump in front of cars, jumping out of cars. She also states she has overdosed on medications in the past with the attempt to kill herself. The patient reports history of self injury by cutting.As far as diagnosis says that she has been diagnosed in the past with schizophrenia, and bipolar. Per the chart that she has been given diagnosis of ADHD and oppositional defiant disorder as a child along with issues related to substance abuse. The patient is supposed to be followed at [facility] but is unclear whether not she is compliant.  Substance Abuse History: Toxicology screen was positive for methamphetamines, cocaine and marijuana. The patient has a long history of polysubstance abuse. She denies any heavy alcohol use. The patient says she quit smoking several months ago but prior to that smoked on and off since her mid teens.  Family Psychiatric  History: Her biological mother has a history of a unknown type of  mental illness.  Social History: The patient was adopted and has been raised by her adopted mother. She has one 46-year-old daughter. The patient has a 10th grade education. She is currently unemployed and on disability. 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 She is dating someone and has a boyfriend that has never been married. She lives with her adoptive mother and her 29-year-old daughter. She has Medicaid.  Legal history: The patient says she has been arrested multiple times in the past for drug charges as well as larceny. She denies any current pending charges.     Associated Signs/Symptoms: Depression Symptoms:  She denies (Hypo) Manic Symptoms:  Distractibility, Elevated Mood, Impulsivity, Irritable Mood, Labiality of Mood, Anxiety Symptoms:  None Psychotic Symptoms:  None PTSD Symptoms: Had a traumatic exposure:  but refuses to give details Total Time spent with patient: 45 minutes   Is the patient at risk to self? Yes.    Has the patient been a risk to self in the past 6 months? Yes.    Has the patient been a risk to self within the distant past? Yes.    Is the patient a risk to others? No.  Has the patient been a risk to others in the past 6 months? No.  Has the patient been a risk to others within the distant past? No.   Prior Inpatient Therapy:  Yes Prior Outpatient Therapy:  Yes  Alcohol Screening: Patient refused Alcohol Screening Tool: Yes Brief Intervention: Patient declined brief intervention Substance Abuse History in the last 12 months:  Yes.   Consequences of Substance Abuse: Medical Consequences:  Intuabted Legal Consequences:  Prior charges Previous Psychotropic Medications: Yes Psychological Evaluations: Yes Past Medical History:  Past Medical History:  Diagnosis Date  . Asthma   . Depression 2009   Inpatient psych admission for SI, dissociative fugue  . Dissociative disorder or reaction 2009  . Eczema   . H/O: suicide attempt   . ODD  (oppositional defiant disorder)   . PTSD (post-traumatic stress disorder)   . Schizoaffective disorder (HCC)   . Substance abuse     Past Surgical History:  Procedure Laterality Date  . ADENOIDECTOMY    . TONSILLECTOMY     Family History:  Family History  Problem Relation Age of Onset  . Adopted: Yes   Social History:  History  Alcohol Use  . Yes    Comment: sometimes     History  Drug Use No      Allergies:   Allergies  Allergen Reactions  . Banana Hives   Lab Results:  Results for orders placed or performed during the hospital encounter of 07/18/17 (from the past 48 hour(s))  Lipid panel     Status: None   Collection Time: 07/19/17  7:02 AM  Result Value Ref Range   Cholesterol 136 0 - 200 mg/dL   Triglycerides 74 <782 mg/dL   HDL 67 >95 mg/dL   Total CHOL/HDL Ratio 2.0 RATIO   VLDL 15 0 - 40 mg/dL   LDL Cholesterol 54 0 - 99 mg/dL    Comment:        Total Cholesterol/HDL:CHD Risk Coronary Heart Disease Risk Table                     Men   Women  1/2 Average Risk   3.4   3.3  Average  Risk       5.0   4.4  2 X Average Risk   9.6   7.1  3 X Average Risk  23.4   11.0        Use the calculated Patient Ratio above and the CHD Risk Table to determine the patient's CHD Risk.        ATP III CLASSIFICATION (LDL):  <100     mg/dL   Optimal  [id number]  mg/dL   Near or Above                    Optimal  130-159  mg/dL   Borderline  [id number]  mg/dL   High  >811>190     mg/dL   Very High   TSH     Status: None   Collection Time: 07/19/17  7:02 AM  Result Value Ref Range   TSH 2.496 0.350 - 4.500 uIU/mL    Comment: Performed by a 3rd Generation assay with a functional sensitivity of <=0.01 uIU/mL.    Blood Alcohol level:  Lab Results  Component Value Date   [facility] 07/16/2017   ETH <5 06/27/2017    Metabolic Disorder Labs:  Lab Results  Component Value Date   HGBA1C 5.1 11/06/2016   MPG 100 11/06/2016   Lab Results  Component Value Date   PROLACTIN 21.5  11/06/2016   PROLACTIN 49.4 (H) 08/13/2016   Lab Results  Component Value Date   CHOL 136 07/19/2017   TRIG 74 07/19/2017   HDL 67 07/19/2017   CHOLHDL 2.0 07/19/2017   VLDL 15 07/19/2017   LDLCALC 54 07/19/2017   LDLCALC 33 11/06/2016    Current Medications: Current Facility-Administered Medications  Medication Dose Route Frequency Provider Last Rate Last Dose  . acetaminophen (TYLENOL) tablet 650 mg  650 mg Oral Q6H PRN Darliss RidgelKapur, Cannon Arreola K, MD       Or  . acetaminophen (TYLENOL) suppository 650 mg  650 mg Rectal Q6H PRN Darliss RidgelKapur, Breeonna Mone K, MD      . albuterol (PROVENTIL HFA;VENTOLIN HFA) 108 (90 Base) MCG/ACT inhaler 1-2 puff  1-2 puff Inhalation Q4H PRN Darliss RidgelKapur, Tynell Winchell K, MD      . alum & mag hydroxide-simeth (MAALOX/MYLANTA) 200-200-20 MG/5ML suspension 30 mL  30 mL Oral Q4H PRN Darliss RidgelKapur, Indy Prestwood K, MD   30 mL at 07/19/17 0815  . clobetasol ointment (TEMOVATE) 0.05 % 1 application  1 application Topical BID PRN Darliss RidgelKapur, Katrina Daddona K, MD      . divalproex (DEPAKOTE ER) 24 hr tablet 750 mg  750 mg Oral QHS Darliss RidgelKapur, Adina Puzzo K, MD      . levofloxacin [facility](LEVAQUIN) tablet 500 mg  500 mg Oral Daily Darliss RidgelKapur, Chrisanna Mishra K, MD   500 mg at 07/19/17 0815  . LORazepam (ATIVAN) tablet 2 mg  2 mg Oral Q6H PRN Darliss RidgelKapur, Anora Schwenke K, MD       Or  . LORazepam (ATIVAN) injection 2 mg  2 mg Intramuscular Q6H PRN Darliss RidgelKapur, Rollie Hynek K, MD      . magnesium hydroxide (MILK OF MAGNESIA) suspension 30 mL  30 mL Oral Daily PRN Darliss RidgelKapur, Matina Rodier K, MD      . mirtazapine (REMERON) tablet 15 mg  15 mg Oral QHS Darliss RidgelKapur, Ardith Lewman K, MD      . mupirocin ointment (BACTROBAN) 2 % 1 application  1 application Nasal BID Darliss RidgelKapur, Hartley Urton K, MD   1 application at 07/19/17 [phone]  . paliperidone (INVEGA) 24 hr tablet 3 mg  3  mg Oral QHS Darliss Ridgel, MD      . triamcinolone cream (KENALOG) 0.1 %   Topical BID Darliss Ridgel, MD       PTA Medications: Prescriptions Prior to Admission  Medication Sig Dispense Refill Last Dose  . albuterol (PROVENTIL HFA;VENTOLIN HFA) 108 (90  Base) MCG/ACT inhaler Inhale 1-2 puffs into the lungs every 4 (four) hours as needed for wheezing or shortness of breath. 1 Inhaler 0 prn at prn  . ARIPiprazole (ABILIFY) 2 MG tablet Take 1 tablet (2 mg total) by mouth at bedtime. (Patient not taking: Reported on 07/16/2017) 30 tablet 0 Not Taking at Unknown time  . clobetasol ointment (TEMOVATE) 0.05 % Apply 1 application topically 2 (two) times daily as needed.  0 prn at prn  . levofloxacin (LEVAQUIN) 500 MG tablet Take 1 tablet (500 mg total) by mouth daily. 5 tablet 0   . mirtazapine (REMERON) 15 MG tablet Take 1 tablet (15 mg total) by mouth at bedtime. (Patient not taking: Reported on 07/16/2017) 30 tablet 0 Not Taking at Unknown time  . mupirocin ointment (BACTROBAN) 2 % Place 1 application into the nose 2 (two) times daily. 22 g 0   . QUEtiapine Fumarate (SEROQUEL XR) 150 MG 24 hr tablet Take 150 mg by mouth at bedtime.   Not Taking at Unknown time  . traZODone (DESYREL) 100 MG tablet Take 100 mg by mouth at bedtime as needed for sleep.   prn at prn  . triamcinolone cream (KENALOG) 0.1 % Apply topically 2 (two) times daily. 30 g 0 prn at prn    Musculoskeletal: Strength & Muscle Tone: within normal limits Gait & Station: normal Patient leans: N/A  Psychiatric Specialty Exam: Physical Exam  Constitutional: She is oriented to person, place, and time. She appears well-developed and well-nourished.  HENT:  Head: Normocephalic and atraumatic.  Eyes: Pupils are equal, round, and reactive to light. Conjunctivae and EOM are normal.  Neck: Normal range of motion. Neck supple. No thyromegaly present.  Cardiovascular: Normal rate, regular rhythm and normal heart sounds.   Respiratory: Effort normal and breath sounds normal. No respiratory distress.  GI: Soft. Bowel sounds are normal. She exhibits no distension. There is no tenderness.  Musculoskeletal: Normal range of motion.  Lymphadenopathy:    She has no cervical adenopathy.  Neurological:  She is alert and oriented to person, place, and time. She has normal reflexes. No cranial nerve deficit.  Skin: Skin is warm and dry. No rash noted. No erythema.    Review of Systems  Constitutional: Negative.  Negative for chills, diaphoresis, fever, malaise/fatigue and weight loss.  HENT: Positive for sore throat. Negative for congestion, hearing loss, nosebleeds and tinnitus.        Sore throat from intubation  Eyes: Negative.  Negative for blurred vision, double vision, photophobia and pain.  Respiratory: Negative.  Negative for cough, hemoptysis, sputum production, shortness of breath and wheezing.   Cardiovascular: Negative.  Negative for chest pain and palpitations.  Gastrointestinal: Negative.  Negative for abdominal pain, constipation, diarrhea, heartburn, nausea and vomiting.  Genitourinary: Negative.  Negative for dysuria, frequency and urgency.  Musculoskeletal: Negative.  Negative for back pain, falls, joint pain, myalgias and neck pain.  Skin: Negative.  Negative for itching and rash.  Neurological: Negative.  Negative for dizziness, tingling, tremors, sensory change, speech change, focal weakness, seizures, weakness and headaches.  Endo/Heme/Allergies: Negative.  Does not bruise/bleed easily.    Blood pressure (!) 111/58, pulse 81, temperature 97.6  F (36.4 C), temperature source Oral, resp. rate 16, height 5\' 5"  (1.651 m), weight 55.3 kg (122 lb), last menstrual period 06/26/2017, SpO2 100 %.Body mass index is 20.3 kg/m.  General Appearance: Casual  Eye Contact:  Good  Speech:  Clear and Coherent and Normal Rate  Volume:  Normal  Mood:  "I am fine"  Affect:  Non-Congruent  Thought Process:  Irrelevant  Orientation:  Full (Time, Place, and Person)  Thought Content:  Illogical  Suicidal Thoughts:  No  Homicidal Thoughts:  No  Memory:  Immediate;   Fair Recent;   Fair Remote;   Fair  Judgement:  Impaired  Insight:  Lacking  Psychomotor Activity:  Normal   Concentration:  Concentration: Fair and Attention Span: Fair  Recall:  [employer] of Knowledge:  Fair  Language:  Good  Akathisia:  No  Handed:  Right  AIMS (if indicated):     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 Tavenner is a 26 year old African-American female with history of schizoaffective disorder, bipolar type overdosed and was admitted to the ICU requiring intubation. Now that she is extubated, she will be admitted to inpatient psychiatry for medication management, safety and stabilization and the patient was mildly labile on the medicine floor and noncompliant with medications. She will be admitted for inpatient psychiatric hospitalization, medication management and stabilization as well as safety.  Mixed Bipolar Type, we'll plan to restart Remeron 15 mg by mouth nightly for insomnia, anxiety and depression as well as Depakote ER 750 mg by mouth nightly. Will have to check valproic acid level and LFTs before titrating of Depakote. The patient will be offered Invega 3 mg by mouth nightly with a plan to transition to an vague injection. The patient says she very much prefers an injection over taking oral medications daily. EKG was checked to rule out QTc prolongation. We'll check hemoglobin A1c and lipid panel.  Cocaine use disorder, cannabis use disorder, methamphetamine use disorder: Toxicology screen was positive for all substances. She was advised to abstain from alcohol and also drugs as they may worsen mood symptoms. The patient very much needs residential substance abuse treatment. She should be referred to [facility].    Aspiration pneumonitis with acute respiratory failure requiring intubation due to altered mental status: Ccontinue patient on levofloxacin for next 5 days to complete course  MRSA carrier, continue mupirocin for the next 4 days to complete course. No need for contact precautions per infection control of the hospital.  Eczema: Will continue Triamcinolo nReviewe and Clobetasol cream to hands.  Social work to contact CPS given noncompliance as well as substance abuse. She does have custody of her 60-year-old daughter. This is her third psychiatric hospitalization within the past 3 months.  Tobacco use disorder: The patient has declined a nicotine patch and says she stopped smoking.  The patient's mother gave collateral information indicating that she was very worried about her given the overdose and the fact that she had just been discharged from [facility]. She was supportive of residential substance abuse treatment.  Disposition: The patient does have a stable living situation. Psychotropic medication management follow-up appointment will be scheduled with [facility].   Daily contact  with patient to assess and evaluate symptoms and progress in treatment and Medication management  Observation Level/Precautions:  15 minute checks                 Physician Treatment Plan for Primary Diagnosis: Bipolar Disorder and Polysubstance Use  Long Term Goal(s): Improvement in symptoms so as ready for discharge  Short Term Goals: Ability to identify changes in lifestyle to reduce recurrence of condition will improve, Ability to verbalize feelings will improve, Ability to disclose and discuss suicidal ideas, Ability to identify and develop effective coping behaviors will improve and Compliance with prescribed medications will improve  Physician Treatment Plan for Secondary Diagnosis: Active Problems:   Moderate mixed bipolar I disorder (HCC)  Long Term Goal(s): Improvement in symptoms so as ready for  discharge  Short Term Goals: Ability to identify changes in lifestyle to reduce recurrence of condition will improve, Ability to verbalize feelings will improve, Ability to disclose and discuss suicidal ideas, Ability to identify and develop effective coping behaviors will improve, Compliance with prescribed medications will improve and Ability to identify triggers associated with substance abuse/mental health issues will improve  I certify that inpatient services furnished can reasonably be expected to improve the patient's condition.    Levora Angel, MD 7/22/201811:10 AM
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
## 2017-07-19 NOTE — Progress Notes (Signed)
The patient signed consent to speak to her mother, witnessed by nursing staff. Her mother reports that she is extremely worried about the patient as [facility]  [facility] prior to the overdose. She is unsure what substances she overdosed on. Her mother cannot identify any triggering factors for overdose but reports that she was noncompliant with medications prior to admission and was using substances. Her mother feels she needs long term treatment but is opposed to her being in a group home.

## 2017-07-19 NOTE — Plan of Care (Signed)
Problem: Health Behavior/Discharge Planning: Goal: Ability to make decisions will improve Outcome: Not Progressing Does not feel that Bridget Carpenter needs any follow-up treatment related to drugs.  Refuses to allow anyone to talk to her mother.  Initially signed consent to talk with mother then came back within 5 minutes stating that Bridget Carpenter does not want to have anyone contact her mother.  Further states that her and her mother do not have a good relationship.

## 2017-07-19 NOTE — [facility] Group Notes (Signed)
[facility] Group Notes: (Clinical Social Work)   07/19/2017      Type of Therapy:  Group Therapy   Participation Level:  Did Not Attend despite MHT prompting   Bridget MantleMareida Grossman-Orr, LCSW 07/19/2017, 2:38 PM

## 2017-07-19 NOTE — Progress Notes (Signed)
Denies SI/HI/AVH.  Bright affect.  Talkative.  Visible in the milieu.  Presents frequently to nurses station with various requests.  Needy at times.  Support offered.  Safety maintained.

## 2017-07-20 ENCOUNTER — Encounter: Payer: Self-pay | Admitting: Psychiatry

## 2017-07-20 DIAGNOSIS — F3164 Bipolar disorder, current episode mixed, severe, with psychotic features: Secondary | ICD-10-CM

## 2017-07-20 MED ORDER — ARIPIPRAZOLE ER 400 MG IM SRER
400.0000 mg | INTRAMUSCULAR | Status: DC
  Administered 2017-07-20: 400 mg via INTRAMUSCULAR
  Filled 2017-07-20: qty 400

## 2017-07-20 MED ORDER — ENSURE ENLIVE PO LIQD
237.0000 mL | Freq: Two times a day (BID) | ORAL | Status: DC
  Administered 2017-07-21 – 2017-07-22 (×3): 237 mL via ORAL

## 2017-07-20 NOTE — Progress Notes (Signed)
NUTRITION ASSESSMENT  Pt identified as at risk on the Malnutrition Screen Tool  INTERVENTION: 1. Educated patient on the importance of nutrition and encouraged intake of food and beverages. 2. Discussed weight goals. 3. Supplements: Ensure Enlive po BID, each supplement provides 350 kcal and 20 grams of protein, allow patient to have double portions at meals  NUTRITION DIAGNOSIS: Inadequate oral intake related to acute illness (intubation, sedation, lack of psych medication) as evidenced by pt report, previous admission  Goal: Pt to meet >/= 90% of their estimated nutrition needs.  Monitor:  PO intake  Assessment:  [name] is a 26 yo female with a history bipolar disorder, polysubstance abuse, schizoaffective disorder, MDD, Aspiration PNA [facility], was recently in ICU intubated, sedated due to overdose. She has been restarted on Abilify now. Very hungry, complains of throat pain "it feels rubbed raw," and also expresses interest in gaining weight. States that cold items help soothe her throat. Complains of some nausea, also asking for pain pills for her throat pain. Ate 100% today. Amenable to consuming ensure.  Height: Ht Readings from Last 1 Encounters:  07/18/17 5\' 5"  (1.651 m)    Weight: Wt Readings from Last 1 Encounters:  07/18/17 122 lb (55.3 kg)    Weight Hx: Wt Readings from Last 10 Encounters:  07/18/17 122 lb (55.3 kg)  07/18/17 122 lb 1.6 oz (55.4 kg)  07/12/17 115 lb (52.2 kg)  07/11/17 115 lb (52.2 kg)  06/27/17 115 lb (52.2 kg)  05/07/17 116 lb (52.6 kg)  05/07/17 125 lb (56.7 kg)  03/27/17 126 lb (57.2 kg)  03/13/17 125 lb (56.7 kg)  12/15/16 130 lb (59 kg)    BMI:  Body mass index is 20.3 kg/m. Pt meets criteria for normal weight based on current BMI.  Estimated Nutritional Needs: Kcal: 25-30 kcal/kg Protein: > 1 gram protein/kg Fluid: 1 ml/kcal  Diet Order: Diet regular Room service appropriate? Yes; Fluid consistency:  Thin Pt is also offered choice of unit snacks mid-morning and mid-afternoon.  Pt is eating as desired.   Lab results and medications reviewed.   Dionne AnoWilliam M. Jayel Inks, MS, RD LDN Inpatient Clinical Dietitian Pager [phone]

## 2017-07-20 NOTE — Progress Notes (Signed)
[facility] MD Progress Note  07/20/2017 11:59 AM Bridget Carpenter  MRN:  [id number] Subjective:  Patient is a 26 year old single African-American female from [place]. She is well-known to our service as she has been admitted a multitude of times. She carries a diagnosis of depression, cannabis-cocaine dependence and borderline personality disorder. The patient was admitted to medicine S/P overdose on Risperdal requiring intubation.  07/20/2017. [name] met with treatment team. She is very disorganized in her thinking unable to answer simple questions or make decisions. She did agree to taking injectable antipsychotic and initially wanted Invega senna. She later came back to tell her that she would prefer Abilify. She then came back to ask me to be discharged immediately because her daughter is in danger. She was recently hospitalized at another facility and overdosed soon after discharge in the context of substance abuse. She appears anxious and restless. The patient accepted medications this morning.  Per nursing: Denies SI/HI/AVH.  Bright affect.  Talkative.  Visible in the milieu.  Presents frequently to nurses station with various requests.  Needy at times.  Support offered.  Safety maintained.    Principal Problem: Bipolar I disorder, most recent episode mixed, severe with psychotic features ([place]) Diagnosis:   Patient Active Problem List   Diagnosis Date Noted  . 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 Bipolar I disorder, most recent episode mixed, severe with psychotic features ([place]ridget Carpenter) [F31.64] 07/18/2017  . Overdose [T50.901A] 07/16/2017  . Aspiration pneumonia ([place]) [J69.0] 07/16/2017  . Acute respiratory failure ([place]) [J96.00] 07/16/2017  . Polysubstance abuse [F19.10] 07/16/2017  . Eczema [L30.9] 05/08/2017  . Borderline personality disorder [F60.3] 11/06/2016  . Cocaine use disorder, severe, dependence (Bridget [place]) [F14.20] 11/06/2016  . Cannabis use disorder, moderate,  dependence (Bridget Carpenter) [F12.20] 11/06/2016  . Alcohol use disorder, mild, abuse [F10.10] 11/06/2016  . Major depressive disorder, recurrent severe without psychotic features (Bridget Carpenter) [F33.2] 11/06/2016  . Asthma [J45.909] 09/23/2011  . Tobacco use disorder [F17.200] 09/15/2009   Total Time spent with patient: 30 minutes  Past Psychiatric History: Bipolar disorder.  Past Medical History:  Past Medical History:  Diagnosis Date  . Asthma   . Depression 2009   Inpatient psych admission for SI, dissociative fugue  . Dissociative disorder or reaction 2009  . Eczema   . H/O: suicide attempt   . ODD (oppositional defiant disorder)   . PTSD (post-traumatic stress disorder)   . Schizoaffective disorder (Bridget Carpenter)   . Substance abuse     Past Surgical History:  Procedure Laterality Date  . ADENOIDECTOMY    . TONSILLECTOMY     Family History:  Family History  Problem Relation Age of Onset  . Adopted: Yes   Family Psychiatric  History: Her biological mother has a history of a unknown type of mental illness.   Social History: She lives with her adoptive mother and her 69-year-old daughter. She has Medicaid.Patient states she is currently unemployed. During her past admission in November she was on parole says she is not longer on parole History  Alcohol Use  . Yes    Comment: sometimes     History  Drug Use No    Social History   Social History  . Marital status: Single    Spouse name: N/A  . Number of children: N/A  . Years of education: N/A   Social History Main Topics  . Smoking status: Former Smoker    Packs/day: 0.50    Years: 2.00    Types: Cigarettes  .  Smokeless tobacco: Never Used  . Alcohol use Yes     Comment: sometimes  . Drug use: No  . Sexual activity: Yes   Other Topics Concern  . None   Social History Narrative  . None     Current Medications: Current Facility-Administered Medications  Medication Dose Route Frequency Provider Last Rate Last Dose  .  acetaminophen (TYLENOL) tablet 650 mg  650 mg Oral Q6H PRN Chauncey Mann, MD       Or  . acetaminophen (TYLENOL) suppository 650 mg  650 mg Rectal Q6H PRN Chauncey Mann, MD      . albuterol (PROVENTIL HFA;VENTOLIN HFA) 108 (90 Base) MCG/ACT inhaler 1-2 puff  1-2 puff Inhalation Q4H PRN Chauncey Mann, MD      . alum & mag hydroxide-simeth (MAALOX/MYLANTA) 200-200-20 MG/5ML suspension 30 mL  30 mL Oral Q4H PRN Chauncey Mann, MD   30 mL at 07/19/17 0815  . ARIPiprazole ER SRER 400 mg  400 mg Intramuscular Q28 days Pucilowska, Jolanta B, MD      . clobetasol ointment (TEMOVATE) 9.62 % 1 application  1 application Topical BID PRN Chauncey Mann, MD      . divalproex (DEPAKOTE ER) 24 hr tablet 750 mg  750 mg Oral QHS Chauncey Mann, MD   750 mg at 07/19/17 2158  . levofloxacin (LEVAQUIN) tablet 500 mg  500 mg Oral Daily Chauncey Mann, MD   500 mg at 07/20/17 0815  . LORazepam (ATIVAN) tablet 2 mg  2 mg Oral Q6H PRN Chauncey Mann, MD       Or  . LORazepam (ATIVAN) injection 2 mg  2 mg Intramuscular Q6H PRN Chauncey Mann, MD      . magnesium hydroxide (MILK OF MAGNESIA) suspension 30 mL  30 mL Oral Daily PRN Chauncey Mann, MD      . mirtazapine (REMERON) tablet 15 mg  15 mg Oral QHS Chauncey Mann, MD   15 mg at 07/19/17 2158  . mupirocin ointment (BACTROBAN) 2 % 1 application  1 application Nasal BID Chauncey Mann, MD   1 application at 22/97/98 [phone]  . paliperidone (INVEGA) 24 hr tablet 3 mg  3 mg Oral QHS Chauncey Mann, MD   3 mg at 07/19/17 2158  . triamcinolone cream (KENALOG) 0.1 %   Topical BID Chauncey Mann, MD        Lab Results:  Results for orders placed or performed during the hospital encounter of 07/18/17 (from the past 48 hour(s))  Lipid panel     Status: None   Collection Time: 07/19/17  7:02 AM  Result Value Ref Range   Cholesterol 136 0 - 200 mg/dL   Triglycerides 74 <150 mg/dL   HDL 67 >40 mg/dL   Total CHOL/HDL Ratio 2.0 RATIO   VLDL 15 0 - 40 mg/dL   LDL  Cholesterol 54 0 - 99 mg/dL    Comment:        Total Cholesterol/HDL:CHD Risk Coronary Heart Disease Risk Table                     Men   Women  1/2 Average Risk   3.4   3.3  Average Risk       5.0   4.4  2 X Average Risk   9.6   7.1  3 X Average Risk  23.4   11.0  Use the calculated Patient Ratio above and the CHD Risk Table to determine the patient's CHD Risk.        ATP III CLASSIFICATION (LDL):  <100     mg/dL   Optimal  100-129  mg/dL   Near or Above                    Optimal  130-159  mg/dL   Borderline  160-189  mg/dL   High  >190     mg/dL   Very High   TSH     Status: None   Collection Time: 07/19/17  7:02 AM  Result Value Ref Range   TSH 2.496 0.350 - 4.500 uIU/mL    Comment: Performed by a 3rd Generation assay with a functional sensitivity of <=0.01 uIU/mL.    Blood Alcohol level:  Lab Results  Component Value Date   ETH <5 07/16/2017   ETH <5 10/14/5101    Metabolic Disorder Labs: Lab Results  Component Value Date   HGBA1C 5.1 11/06/2016   MPG 100 11/06/2016   Lab Results  Component Value Date   PROLACTIN 21.5 11/06/2016   PROLACTIN 49.4 (H) 08/13/2016   Lab Results  Component Value Date   CHOL 136 07/19/2017   TRIG 74 07/19/2017   HDL 67 07/19/2017   CHOLHDL 2.0 07/19/2017   VLDL 15 07/19/2017   LDLCALC 54 07/19/2017   LDLCALC 33 11/06/2016    Physical Findings: AIMS:  , ,  ,  ,    CIWA:    COWS:     Musculoskeletal: Strength & Muscle Tone: within normal limits Gait & Station: normal Patient leans: N/A  Psychiatric Specialty Exam: Physical Exam  Nursing note and vitals reviewed. Constitutional: She is oriented to person, place, and time.  Neurological: She is oriented to person, place, and time.  Psychiatric: Her affect is labile. Her speech is rapid and/or pressured. She is hyperactive. Thought content is paranoid and delusional. Cognition and memory are normal. She expresses impulsivity.    Review of Systems  HENT:  Positive for sore throat.   Skin: Positive for rash.  Psychiatric/Behavioral: Positive for hallucinations and substance abuse.  All other systems reviewed and are negative.   Blood pressure (!) 114/59, pulse 81, temperature 98.4 F (36.9 C), temperature source Oral, resp. rate 16, height 5' 5" (1.651 m), weight 55.3 kg (122 lb), last menstrual period 06/26/2017, SpO2 100 %.Body mass index is 20.3 kg/m.  General Appearance: Disheveled  Eye Contact:  Minimal  Speech:  Slow  Volume:  Decreased  Mood:  Irritable  Affect:  Congruent  Thought Process:  Linear and Descriptions of Associations: Intact  Orientation:  Full (Time, Place, and Person)  Thought Content:  Hallucinations: None  Suicidal Thoughts:  No  Homicidal Thoughts:  No  Memory:  Immediate;   Fair Recent;   Fair Remote;   Fair  Judgement:  Poor  Insight:  Shallow  Psychomotor Activity:  Decreased  Concentration:  Concentration: Fair and Attention Span: Fair  Recall:  [employer] of Knowledge:  Fair  Language:  Good  Akathisia:  No  Handed:    AIMS (if indicated):     Assets:  Communication Skills Physical Health  ADL's:  Intact  Cognition:  WNL  Sleep:  Number of Hours: 6.3     Treatment Plan Summary: Ms. Sisler is a 26 year old female with history of depression, mood instability, and substance abuse admitted to the medicine floor S/P Overdose. The patient  does have a history of a mood disorder and has had multiple overdose in the past. She has been hospitalized at least twice this year are ready and was hospitalized earlier this month at [facility]. At this time, she is an imminent danger to herself or others secondary to impulsive behavior and not being stabilized on medication. She has a long history of noncompliance.  1. Suicidal ideation. The patient is able to contract for safety in the hospital.  2. Mood. She was restarted on a combination of Depakote and Invega for mood stabilization and Remeron for  depression. She now agrees to [employer] injection. We will give 400 mg today. She has been treated with Abilify in the past.  3. Asthma. Albuterol is available.  4. Pneumonia. She is on Levaquin.  5. Eczema. Clobetasol and Kenalog are available.  6. Metabolic syndrome monitoring. Lipid panel and TSH are normal. Hemoglobin A1c pending.  7. EKG. Sinus tachycardia, QTc 460.  8. Substance abuse. The patient minimizes problems and declines treatment.  9. Disposition. She will be discharged with her mother. There is a history of treatment noncompliance.

## 2017-07-20 NOTE — Tx Team (Signed)
Interdisciplinary Treatment and Diagnostic Plan Update  07/20/2017 Time of Session: 10:30am [name] MRN: [id number]  Principal Diagnosis: Bipolar I disorder, most recent episode mixed, severe with psychotic features (HCC)  Secondary Diagnoses: Principal Problem:   Bipolar I disorder, most recent episode mixed, severe with psychotic features (HCC) Active Problems:   Cocaine use disorder, severe, dependence (HCC)   Cannabis use disorder, moderate, dependence (HCC)   Eczema   Overdose   Current Medications:  Current Facility-Administered Medications  Medication Dose Route Frequency Provider Last Rate Last Dose  . acetaminophen (TYLENOL) tablet 650 mg  650 mg Oral Q6H PRN Darliss Ridgel, MD      . albuterol (PROVENTIL HFA;VENTOLIN HFA) 108 (90 Base) MCG/ACT inhaler 1-2 puff  1-2 puff Inhalation Q4H PRN Darliss Ridgel, MD      . alum & mag hydroxide-simeth (MAALOX/MYLANTA) 200-200-20 MG/5ML suspension 30 mL  30 mL Oral Q4H PRN Darliss Ridgel, MD   30 mL at 07/19/17 0815  . ARIPiprazole ER SRER 400 mg  400 mg Intramuscular Q28 days Pucilowska, Jolanta B, MD   400 mg at 07/20/17 1349  . clobetasol ointment (TEMOVATE) 0.05 % 1 application  1 application Topical BID PRN Darliss Ridgel, MD      . divalproex (DEPAKOTE ER) 24 hr tablet 750 mg  750 mg Oral QHS Darliss Ridgel, MD   750 mg at 07/19/17 2158  . levofloxacin (LEVAQUIN) tablet 500 mg  500 mg Oral Daily Darliss Ridgel, MD   500 mg at 07/20/17 0815  . LORazepam (ATIVAN) tablet 2 mg  2 mg Oral Q6H PRN Darliss Ridgel, MD       Or  . LORazepam (ATIVAN) injection 2 mg  2 mg Intramuscular Q6H PRN Darliss Ridgel, MD      . magnesium hydroxide (MILK OF MAGNESIA) suspension 30 mL  30 mL Oral Daily PRN Darliss Ridgel, MD      . mirtazapine (REMERON) tablet 15 mg  15 mg Oral QHS Darliss Ridgel, MD   15 mg at 07/19/17 2158  . mupirocin ointment (BACTROBAN) 2 % 1 application  1 application Nasal BID Darliss Ridgel, MD   1 application at  07/19/17 [phone]  . paliperidone (INVEGA) 24 hr tablet 3 mg  3 mg Oral QHS Darliss Ridgel, MD   3 mg at 07/19/17 2158  . triamcinolone cream (KENALOG) 0.1 %   Topical BID Darliss Ridgel, MD       PTA Medications: Prescriptions Prior to Admission  Medication Sig Dispense Refill Last Dose  . albuterol (PROVENTIL HFA;VENTOLIN HFA) 108 (90 Base) MCG/ACT inhaler Inhale 1-2 puffs into the lungs every 4 (four) hours as needed for wheezing or shortness of breath. 1 Inhaler 0 prn at prn  . ARIPiprazole (ABILIFY) 2 MG tablet Take 1 tablet (2 mg total) by mouth at bedtime. (Patient not taking: Reported on 07/16/2017) 30 tablet 0 Not Taking at Unknown time  . clobetasol ointment (TEMOVATE) 0.05 % Apply 1 application topically 2 (two) times daily as needed.  0 prn at prn  . levofloxacin (LEVAQUIN) 500 MG tablet Take 1 tablet (500 mg total) by mouth daily. 5 tablet 0   . mirtazapine (REMERON) 15 MG tablet Take 1 tablet (15 mg total) by mouth at bedtime. (Patient not taking: Reported on 07/16/2017) 30 tablet 0 Not Taking at Unknown time  . mupirocin ointment (BACTROBAN) 2 % Place 1 application into the nose 2 (two) times daily. 22  g 0   . QUEtiapine Fumarate (SEROQUEL XR) 150 MG 24 hr tablet Take 150 mg by mouth at bedtime.   Not Taking at Unknown time  . traZODone (DESYREL) 100 MG tablet Take 100 mg by mouth at bedtime as needed for sleep.   prn at prn  . triamcinolone cream (KENALOG) 0.1 % Apply topically 2 (two) times daily. 30 g 0 prn at prn    Patient Stressors: Substance abuse  Patient Strengths: General fund of knowledge Supportive family/friends  Treatment Modalities: Medication Management, Group therapy, Case management,  1 to 1 session with clinician, Psychoeducation, Recreational therapy.   Physician Treatment Plan for Primary Diagnosis: Bipolar I disorder, most recent episode mixed, severe with psychotic features (HCC) Long Term Goal(s): Improvement in symptoms so as ready for  discharge Improvement in symptoms so as ready for discharge   Short Term Goals: Ability to identify changes in lifestyle to reduce recurrence of condition will improve Ability to verbalize feelings will improve Ability to disclose and discuss suicidal ideas Ability to identify and develop effective coping behaviors will improve Compliance with prescribed medications will improve Ability to identify changes in lifestyle to reduce recurrence of condition will improve Ability to verbalize feelings will improve Ability to disclose and discuss suicidal ideas Ability to identify and develop effective coping behaviors will improve Compliance with prescribed medications will improve Ability to identify triggers associated with substance abuse/mental health issues will improve  Medication Management: Evaluate patient's response, side effects, and tolerance of medication regimen.  Therapeutic Interventions: 1 to 1 sessions, Unit Group sessions and Medication administration.  Evaluation of Outcomes: Progressing  Physician Treatment Plan for Secondary Diagnosis: Principal Problem:   Bipolar I disorder, most recent episode mixed, severe with psychotic features (HCC) Active Problems:   Cocaine use disorder, severe, dependence (HCC)   Cannabis use disorder, moderate, dependence (HCC)   Eczema   Overdose  Long Term Goal(s): Improvement in symptoms so as ready for discharge Improvement in symptoms so as ready for discharge   Short Term Goals: Ability to identify changes in lifestyle to reduce recurrence of condition will improve Ability to verbalize feelings will improve Ability to disclose and discuss suicidal ideas Ability to identify and develop effective coping behaviors will improve Compliance with prescribed medications will improve Ability to identify changes in lifestyle to reduce recurrence of condition will improve Ability to verbalize feelings will improve Ability to disclose and  discuss suicidal ideas Ability to identify and develop effective coping behaviors will improve Compliance with prescribed medications will improve Ability to identify triggers associated with substance abuse/mental health issues will improve     Medication Management: Evaluate patient's response, side effects, and tolerance of medication regimen.  Therapeutic Interventions: 1 to 1 sessions, Unit Group sessions and Medication administration.  Evaluation of Outcomes: Progressing   RN Treatment Plan for Primary Diagnosis: Bipolar I disorder, most recent episode mixed, severe with psychotic features (HCC) Long Term Goal(s): Knowledge of disease and therapeutic regimen to maintain health will improve  Short Term Goals: Ability to verbalize feelings will improve, Ability to disclose and discuss suicidal ideas, Ability to identify and develop effective coping behaviors will improve and Compliance with prescribed medications will improve  Medication Management: RN will administer medications as ordered by provider, will assess and evaluate patient's response and provide education to patient for prescribed medication. RN will report any adverse and/or side effects to prescribing provider.  Therapeutic Interventions: 1 on 1 counseling sessions, Psychoeducation, Medication administration, Evaluate responses to treatment, Monitor  vital signs and CBGs as ordered, Perform/monitor CIWA, COWS, AIMS and Fall Risk screenings as ordered, Perform wound care treatments as ordered.  Evaluation of Outcomes: Progressing   LCSW Treatment Plan for Primary Diagnosis: Bipolar I disorder, most recent episode mixed, severe with psychotic features (HCC) Long Term Goal(s): Safe transition to appropriate next level of care at discharge, Engage patient in therapeutic group addressing interpersonal concerns.  Short Term Goals: Engage patient in aftercare planning with referrals and resources, Increase social support,  Increase ability to appropriately verbalize feelings, Increase emotional regulation, Facilitate acceptance of mental health diagnosis and concerns, Facilitate patient progression through stages of change regarding substance use diagnoses and concerns, Identify triggers associated with mental health/substance abuse issues and Increase skills for wellness and recovery  Therapeutic Interventions: Assess for all discharge needs, 1 to 1 time with Social worker, Explore available resources and support systems, Assess for adequacy in community support network, Educate family and significant other(s) on suicide prevention, Complete Psychosocial Assessment, Interpersonal group therapy.  Evaluation of Outcomes: Progressing   Progress in Treatment: Attending groups: Yes. Participating in groups: Yes. Taking medication as prescribed: Yes. Toleration medication: Yes. Family/Significant other contact made: No, will contact:  PT DECLINED Patient understands diagnosis: Yes. Discussing patient identified problems/goals with staff: Yes. Medical problems stabilized or resolved: Yes. Denies suicidal/homicidal ideation: Yes. Issues/concerns per patient self-inventory: Yes. Other:    New problem(s) identified: No, Describe:     New Short Term/Long Term Goal(s):  Discharge Plan or Barriers: Discharge home to her mother with follow up at outpatient clinic [facility]  Reason for Continuation of Hospitalization: Medication stabilization Other; describe Disorganized thoughts  Estimated Length of Stay:3-5 days  Attendees: Patient:Bridget Carpenter 07/20/2017 3:12 PM  Physician: Kristine Linea 07/20/2017 3:12 PM  Nursing: Shelia Media, RN 07/20/2017 3:12 PM  RN Care Manager: 07/20/2017 3:12 PM  Social Worker: Jake Shark, LCSW 07/20/2017 3:12 PM  Recreational Therapist:  07/20/2017 3:12 PM  Other:  07/20/2017 3:12 PM  Other:  07/20/2017 3:12 PM  Other: 07/20/2017 3:12 PM    Scribe for Treatment Team: Glennon Mac,  LCSW 07/20/2017 3:12 PM

## 2017-07-20 NOTE — [facility] Group Notes (Signed)
[facility] LCSW Group Therapy Note  Date/Time: 07/20/17, 1300  Type of Therapy and Topic:  Group Therapy:  Overcoming Obstacles  Participation Level:  active  Description of Group:    In this group patients will be encouraged to explore what they see as obstacles to their own wellness and recovery. They will be guided to discuss their thoughts, feelings, and behaviors related to these obstacles. The group will process together ways to cope with barriers, with attention given to specific choices patients can make. Each patient will be challenged to identify changes they are motivated to make in order to overcome their obstacles. This group will be process-oriented, with patients participating in exploration of their own experiences as well as giving and receiving support and challenge from other group members.  Therapeutic Goals: 1. Patient will identify personal and current obstacles as they relate to admission. 2. Patient will identify barriers that currently interfere with their wellness or overcoming obstacles.  3. Patient will identify feelings, thought process and behaviors related to these barriers. 4. Patient will identify two changes they are willing to make to overcome these obstacles:    Summary of Patient Progress: Pt came to group late and left once during group, but was appropriate.  Pt shared that her obstacle is that she is much to generous with giving her mother access to her money which leaves pt herself short of money on a regular basis.        Therapeutic Modalities:   Cognitive Behavioral Therapy Solution Focused Therapy Motivational Interviewing Relapse Prevention Therapy  Daleen SquibbGreg Randalyn Ahmed, LCSW

## 2017-07-20 NOTE — Plan of Care (Signed)
Problem: Safety: Goal: Ability to remain free from injury will improve Outcome: Progressing Pt remains safe while in hospital injury free.    

## 2017-07-20 NOTE — Progress Notes (Signed)
Pt took IM Abilify injection and tolerated well. She denies current SI, HI, a/v hallucinations. Pt continues to have disorganized thought process. PT is focused on discharge. No aggressive or hostile behaviors noted. Will continue to monitor for safety.

## 2017-07-21 MED ORDER — PALIPERIDONE ER 3 MG PO TB24: 6.0000 mg | ORAL_TABLET | Freq: Every day | ORAL | Status: DC

## 2017-07-21 NOTE — Progress Notes (Signed)
  [facility]  Will you be returning to the same living situation after discharge:  No. At discharge, do you have transportation home?: No. Do you have the ability to pay for your medications: Yes,     Release of information consent forms completed and in the chart;  Patient's signature needed at discharge.  Patient to Follow up at: Follow-up Information    [facility] Go on 07/28/2017.   Why:  12:30am, for Hospital Follow up Contact information: [address] [facility] [place] [id number] [phone]           Next level of care provider [facility]:no  Safety Planning and Suicide Prevention discussed: Yes,        Has patient been referred to the Quitline?: Patient refused referral  Patient has been referred for addiction treatment: Yes  Glennon MacSara P Donavyn Fecher, LCSW 07/21/2017, 4:31 PM

## 2017-07-21 NOTE — Progress Notes (Signed)
[facility] MD Progress Note  07/21/2017 10:44 AM [name]  MRN:  [id number] Subjective:  Patient is a 26 year old single African-American female from [facility] [place]. She is well-known to our service as she has been admitted a multitude of times. She carries a diagnosis of depression, cannabis-cocaine dependence and borderline personality disorder. The patient was admitted to medicine S/P overdose on Risperdal requiring intubation.  07/20/2017. Bridget Carpenter met with treatment team. She is very disorganized in her thinking unable to answer simple questions or make decisions. She did agree to taking injectable antipsychotic and initially wanted Invega senna. She later came back to tell her that she would prefer Abilify. She then came back to ask me to be discharged immediately because her daughter is in danger. She was recently hospitalized at another facility and overdosed soon after discharge in the context of substance abuse. She appears anxious and restless. The patient accepted medications this morning.  07/21/2017. Bridget Carpenter agreed to Abilify maintena injection yesterday. She insists on being discharged to home as soon as possible to be with her 45-year-old daughter who is in custody of her mother. SW spoke with the mother who reported that the patient, in spite of being hospitalized recently for two and a half weeks, is still disorganized with bizarre, but not dangerous behaviors. She has been noncompliant with medications, insomniac, using substances. She overdose on medications prior to coming to the hospital, which worries her mother. She is on Depakote. We will check her level on Friday prior to discharge.  Per nursing: Pt took IM Abilify injection and tolerated well. She denies current SI, HI, a/v hallucinations. Pt continues to have disorganized thought process. PT is focused on discharge. No aggressive or hostile behaviors noted. Will continue to monitor for safety.   Principal  Problem: Bipolar I disorder, most recent episode mixed, severe with psychotic features (HCC) Diagnosis:   Patient Active Problem List   Diagnosis Date Noted  . 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 Bipolar I disorder, most recent episode mixed, severe with psychotic features (HCC) [F31.64] 07/18/2017  . Overdose [T50.901A] 07/16/2017  . Aspiration pneumonia (HCC) [J69.0] 07/16/2017  . Acute respiratory failure (HCC) [J96.00] 07/16/2017  . Polysubstance abuse [F19.10] 07/16/2017  . Eczema [L30.9] 05/08/2017  . Borderline personality disorder [F60.3] 11/06/2016  . Cocaine use disorder, severe, dependence (HCC) [F14.20] 11/06/2016  . Cannabis use disorder, moderate, dependence (HCC) [F12.20] 11/06/2016  . Alcohol use disorder, mild, abuse [F10.10] 11/06/2016  . Major depressive disorder, recurrent severe without psychotic features (HCC) [F33.2] 11/06/2016  . Asthma [J45.909] 09/23/2011  . Tobacco use disorder [F17.200] 09/15/2009   Total Time spent with patient: 30 minutes  Past Psychiatric History: Bipolar disorder.  Past Medical History:  Past Medical History:  Diagnosis Date  . Asthma   . Depression 2009   Inpatient psych admission for SI, dissociative fugue  . Dissociative disorder or reaction 2009  . Eczema   . H/O: suicide attempt   . ODD (oppositional defiant disorder)   . PTSD (post-traumatic stress disorder)   . Schizoaffective disorder (HCC)   . Substance abuse     Past Surgical History:  Procedure Laterality Date  . ADENOIDECTOMY    . TONSILLECTOMY     Family History:  Family History  Problem Relation Age of Onset  . Adopted: Yes   Family Psychiatric  History: Her biological mother has a history of a unknown type of mental illness.   Social History: She lives with her adoptive mother  and her 39-year-old daughter. She has Medicaid.Patient states she is currently unemployed. During her past admission in November she was on parole says she is not longer on  parole History  Alcohol Use  . Yes    Comment: sometimes     History  Drug Use No    Social History   Social History  . Marital status: Single    Spouse name: N/A  . Number of children: N/A  . Years of education: N/A   Social History Main Topics  . Smoking status: Former Smoker    Packs/day: 0.50    Years: 2.00    Types: Cigarettes  . Smokeless tobacco: Never Used  . Alcohol use Yes     Comment: sometimes  . Drug use: No  . Sexual activity: Yes   Other Topics Concern  . None   Social History Narrative  . None     Current Medications: Current Facility-Administered Medications  Medication Dose Route Frequency Provider Last Rate Last Dose  . acetaminophen (TYLENOL) tablet 650 mg  650 mg Oral Q6H PRN Chauncey Mann, MD      . albuterol (PROVENTIL HFA;VENTOLIN HFA) 108 (90 Base) MCG/ACT inhaler 1-2 puff  1-2 puff Inhalation Q4H PRN Chauncey Mann, MD      . alum & mag hydroxide-simeth (MAALOX/MYLANTA) 200-200-20 MG/5ML suspension 30 mL  30 mL Oral Q4H PRN Chauncey Mann, MD   30 mL at 07/19/17 0815  . ARIPiprazole ER SRER 400 mg  400 mg Intramuscular Q28 days Tika Hannis B, MD   400 mg at 07/20/17 1349  . clobetasol ointment (TEMOVATE) 1.74 % 1 application  1 application Topical BID PRN Chauncey Mann, MD      . divalproex (DEPAKOTE ER) 24 hr tablet 750 mg  750 mg Oral QHS Chauncey Mann, MD   750 mg at 07/20/17 2103  . feeding supplement (ENSURE ENLIVE) (ENSURE ENLIVE) liquid 237 mL  237 mL Oral BID BM Forrester Blando B, MD   237 mL at 07/21/17 1026  . levofloxacin (LEVAQUIN) tablet 500 mg  500 mg Oral Daily Chauncey Mann, MD   500 mg at 07/21/17 0811  . LORazepam (ATIVAN) tablet 2 mg  2 mg Oral Q6H PRN Chauncey Mann, MD       Or  . LORazepam (ATIVAN) injection 2 mg  2 mg Intramuscular Q6H PRN Chauncey Mann, MD      . magnesium hydroxide (MILK OF MAGNESIA) suspension 30 mL  30 mL Oral Daily PRN Chauncey Mann, MD      . mirtazapine (REMERON) tablet 15 mg   15 mg Oral QHS Chauncey Mann, MD   15 mg at 07/20/17 2103  . mupirocin ointment (BACTROBAN) 2 % 1 application  1 application Nasal BID Chauncey Mann, MD   1 application at 94/49/67 [phone]  . paliperidone (INVEGA) 24 hr tablet 6 mg  6 mg Oral QHS Spenser Cong B, MD      . triamcinolone cream (KENALOG) 0.1 %   Topical BID Chauncey Mann, MD        Lab Results:  No results found for this or any previous visit (from the past 48 hour(s)).  Blood Alcohol level:  Lab Results  Component Value Date   [facility] <5 07/16/2017   ETH <5 38/46/6599    Metabolic Disorder Labs: Lab Results  Component Value Date   HGBA1C 5.1 11/06/2016   MPG 100 11/06/2016   Lab Results  Component Value Date   PROLACTIN 21.5 11/06/2016   PROLACTIN 49.4 (H) 08/13/2016   Lab Results  Component Value Date   CHOL 136 07/19/2017   TRIG 74 07/19/2017   HDL 67 07/19/2017   CHOLHDL 2.0 07/19/2017   VLDL 15 07/19/2017   LDLCALC 54 07/19/2017   LDLCALC 33 11/06/2016    Physical Findings: AIMS:  , ,  ,  ,    CIWA:    COWS:     Musculoskeletal: Strength & Muscle Tone: within normal limits Gait & Station: normal Patient leans: N/A  Psychiatric Specialty Exam: Physical Exam  Nursing note and vitals reviewed. Constitutional: She is oriented to person, place, and time.  Neurological: She is oriented to person, place, and time.  Psychiatric: Her affect is labile. Her speech is rapid and/or pressured. She is hyperactive. Thought content is paranoid and delusional. Cognition and memory are normal. She expresses impulsivity.    Review of Systems  HENT: Positive for sore throat.   Skin: Positive for rash.  Psychiatric/Behavioral: Positive for hallucinations and substance abuse.  All other systems reviewed and are negative.   Blood pressure 112/62, pulse 77, temperature 98.4 F (36.9 C), temperature source Oral, resp. rate 16, height '5\' 5"'$  (1.651 m), weight 55.3 kg (122 lb), last menstrual period 06/26/2017,  SpO2 100 %.Body mass index is 20.3 kg/m.  General Appearance: Disheveled  Eye Contact:  Minimal  Speech:  Slow  Volume:  Decreased  Mood:  Irritable  Affect:  Congruent  Thought Process:  Linear and Descriptions of Associations: Intact  Orientation:  Full (Time, Place, and Person)  Thought Content:  Hallucinations: None  Suicidal Thoughts:  No  Homicidal Thoughts:  No  Memory:  Immediate;   Fair Recent;   Fair Remote;   Fair  Judgement:  Poor  Insight:  Shallow  Psychomotor Activity:  Decreased  Concentration:  Concentration: Fair and Attention Span: Fair  Recall:  [employer] of Knowledge:  Fair  Language:  Good  Akathisia:  No  Handed:    AIMS (if indicated):     Assets:  Communication Skills Physical Health  ADL's:  Intact  Cognition:  WNL  Sleep:  Number of Hours: 6.3     Treatment Plan Summary: Bridget Carpenter is a 26 year old female with history of depression, mood instability, and substance abuse admitted to the medicine floor S/P Overdose. The patient does have a history of a mood disorder and has had multiple overdose in the past. She has been hospitalized at least twice this year are ready and was hospitalized earlier this month at [facility]. At this time, she is an imminent danger to herself or others secondary to impulsive behavior and not being stabilized on medication. She has a long history of noncompliance.  1. Suicidal ideation. The patient is able to contract for safety in the hospital.  2. Mood. She was restarted on a combination of Depakote and Invega for mood stabilization and Remeron for depression. She agreed to Abilify maintenana injections and was given 400 mg on 07/20/2017. VPA level on 07/24/2017.  3. Asthma. Albuterol is available.  4. Pneumonia. She is on Levaquin.  5. Eczema. Clobetasol and Kenalog are available.  6. Metabolic syndrome monitoring. Lipid panel and TSH are normal. Hemoglobin A1c pending.  7. EKG. Sinus tachycardia, QTc 460.  8.  Substance abuse. The patient minimizes problems and declines treatment.  9. Disposition. She will be discharged with her mother. There is a history of treatment noncompliance.

## 2017-07-21 NOTE — Plan of Care (Signed)
Problem: Safety: Goal: Periods of time without injury will increase Outcome: Progressing Pt remains safe while in hospital injury free.    

## 2017-07-21 NOTE — Progress Notes (Signed)
Pt continues to have disorganized thought process. No aggressive behaviors observed. Pt is calm and pleasant with staff. She is visible on unit and interacts well with peers. Pt is medication and meal compliant. Will continue to monitor for safety.

## 2017-07-22 LAB — VALPROIC ACID LEVEL: Valproic Acid Lvl: 29 ug/mL — ABNORMAL LOW (ref 50.0–100.0)

## 2017-07-22 MED ORDER — CLOBETASOL PROPIONATE 0.05 % EX OINT: 1.0000 "application " | TOPICAL_OINTMENT | Freq: Two times a day (BID) | CUTANEOUS | 1 refills | Status: DC | PRN

## 2017-07-22 MED ORDER — DIVALPROEX SODIUM ER 500 MG PO TB24: 1000.0000 mg | ORAL_TABLET | Freq: Every day | ORAL | Status: DC

## 2017-07-22 MED ORDER — LEVOFLOXACIN 500 MG PO TABS: 500.0000 mg | ORAL_TABLET | Freq: Every day | ORAL | 0 refills | Status: AC

## 2017-07-22 MED ORDER — TRIAMCINOLONE ACETONIDE 0.1 % EX CREA: TOPICAL_CREAM | Freq: Two times a day (BID) | CUTANEOUS | 1 refills | Status: DC

## 2017-07-22 MED ORDER — ARIPIPRAZOLE ER 400 MG IM SRER: 400.0000 mg | INTRAMUSCULAR | 1 refills | Status: DC

## 2017-07-22 MED ORDER — DIVALPROEX SODIUM ER 500 MG PO TB24: 1000.0000 mg | ORAL_TABLET | Freq: Every day | ORAL | 1 refills | Status: DC

## 2017-07-22 MED ORDER — MIRTAZAPINE 15 MG PO TABS: 15.0000 mg | ORAL_TABLET | Freq: Every day | ORAL | 1 refills | Status: DC

## 2017-07-22 MED ORDER — PALIPERIDONE ER 6 MG PO TB24: 6.0000 mg | ORAL_TABLET | Freq: Every day | ORAL | 0 refills | Status: DC

## 2017-07-22 NOTE — Discharge Summary (Addendum)
Physician Discharge Summary Note  Patient:  Bridget Carpenter Bridget Carpenter is an 26 y.o., female MRN:  [id number] DOB:  23-Dec-1991 Patient phone:  [phone] (home)  Patient address:   [address] [place] [address],  Total Time spent with patient: 30 minutes  Date of Admission:  07/18/2017 Date of Discharge: 07/22/2017  Reason for Admission:  Overdose.  History of present illness. Mr. Line is a 26 year old African-American female with a history of schizoaffective disorder very well known to [facility] inpatient psychiatry she has had multiple prior inpatient psychiatric . She was admitted to the medicine service on July 19 after she overdosed on possibly Seroquel and trazodone. The patient herself believes that she overdosed on Risperdal the records report Seroquel and trazodone. The patient says she is not sure. She was intubated and admitted to the ICU. The patient is now status post extubation. The patient was very vague about the overdose and stated that she did not believe that she overdosed but actually had an allergic reaction to the medications which caused her to be intubated. She admits to noncompliance with psychotropic medications prior to admission. She was just discharged from [facility] earlier this month after a 2 week stay. While on the medicine service, the patient was irritable and argumentative. She was not willing to be compliant with any psychotropic medications. Insight and judgment remain poor. She denies that the overdose was a suicide attempt and is denying any current suicidal thoughts. Affect however is mildly labile and the patient appears mildly euphoric at times. She does endorse racing thoughts and decreased sleep at night. She denies any auditory or visual hallucinations. No paranoid thoughts or delusions. She denies any heavy alcohol use but has been abusing multiple drugs including methamphetamines, cocaine and marijuana. She denies any heavy  alcohol use.  Past Psychiatric History: The patient has a history of multiple prior inpatient psychiatric hospitalizations due to suicidality in the context of polysubstance abuse and psychosis. She has been hospitalized over 20 times and was just released from [facility]. She has had multiple suicide attempts including trying to jump in front of cars, jumping out of cars. She also states she has overdosed on medications in the past with the attempt to kill herself. The patient reports history of self injury by cutting.As far as diagnosis says that she has been diagnosed in the past with schizophrenia, and bipolar. Per the chart that she has been given diagnosis of ADHD and oppositional defiant disorder as a child along with issues related to substance abuse. The patient is supposed to be followed at [facility] but is unclear whether not she is compliant.  Substance Abuse History: Toxicology screen was positive for methamphetamines, cocaine and marijuana. The patient has a long history of polysubstance abuse. She denies any heavy alcohol use. The patient says she quit smoking several months ago but prior to that smoked on and off since her mid teens.  Family Psychiatric History: Her biological mother has a history of a unknown type of mental illness.  Social History:The patient was adopted and has been raised by her adopted mother. She has one 3-year-old daughter. The patient has a 10th grade education. She is currently unemployed and on disability. 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 She is dating someone and has a boyfriend that has never been married. She lives with her adoptive mother and her 34-year-old daughter. She has Medicaid. The patient says she has been arrested multiple times in the past  for drug charges as well as larceny. She denies any current pending charges.   Associated Signs/Symptoms: Depression Symptoms:  She denies (Hypo) Manic Symptoms:  Distractibility, Elevated Mood,  Impulsivity, Irritable Mood, Labiality of Mood, Anxiety Symptoms:  None Psychotic Symptoms:  None PTSD Symptoms: Had a traumatic exposure:  but refuses to give details  Principal Problem: Bipolar I disorder, most recent episode mixed, severe with psychotic features [facility]) Discharge Diagnoses: Patient Active Problem List   Diagnosis Date Noted  . 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 Bipolar I disorder, most recent episode mixed, severe with psychotic features (HCC) [F31.64] 07/18/2017  . Overdose [T50.901A] 07/16/2017  . Aspiration pneumonia (HCC) [J69.0] 07/16/2017  . Acute respiratory failure (HCC) [J96.00] 07/16/2017  . Polysubstance abuse [F19.10] 07/16/2017  . Eczema [L30.9] 05/08/2017  . Borderline personality disorder [F60.3] 11/06/2016  . Cocaine use disorder, severe, dependence (HCC) [F14.20] 11/06/2016  . Cannabis use disorder, moderate, dependence (HCC) [F12.20] 11/06/2016  . Alcohol use disorder, mild, abuse [F10.10] 11/06/2016  . Major depressive disorder, recurrent severe without psychotic features (HCC) [F33.2] 11/06/2016  . Asthma [J45.909] 09/23/2011  . Tobacco use disorder [F17.200] 09/15/2009   Past Medical History:  Past Medical History:  Diagnosis Date  . Asthma   . Depression 2009   Inpatient psych admission for SI, dissociative fugue  . Dissociative disorder or reaction 2009  . Eczema   . H/O: suicide attempt   . ODD (oppositional defiant disorder)   . PTSD (post-traumatic stress disorder)   . Schizoaffective disorder (HCC)   . Substance abuse     Past Surgical History:  Procedure Laterality Date  . ADENOIDECTOMY    . TONSILLECTOMY     Family History:  Family History  Problem Relation Age of Onset  . Adopted: Yes   Social History:  History  Alcohol Use  . Yes    Comment: sometimes     History  Drug Use No    Social History   Social History  . Marital status: Single    Spouse name: N/A  . Number of children: N/A  . Years of education:  N/A   Social History Main Topics  . Smoking status: Former Smoker    Packs/day: 0.50    Years: 2.00    Types: Cigarettes  . Smokeless tobacco: Never Used  . Alcohol use Yes     Comment: sometimes  . Drug use: No  . Sexual activity: Yes   Other Topics Concern  . None   Social History Narrative  . None    Hospital Course:    Ms. Beaulac is a 26 year old female with history of depression, mood instability, and substance abuse admitted to the medicine floor after overdose. The patient does have a history of a mood disorder and has had multiple overdose in the past. She has been hospitalized at least twice this year already and was hospitalized earlier this month at [facility]. She has a long history of noncompliance.  1. Suicidal ideation. Resolved. The patient is able to contract for safety. She is forward thinking and optimistic about the future. She is a loving mother.   2. Mood. She was restarted on a combination of Depakote and Invega for mood stabilization and Remeron for depression. She agreed to Abilify maintenana injections and was given 400 mg on 07/20/2017. VPA level on 07/22/2017 was 27. We increased depakote dose to 1000 mg nightly. The patient warns me that she will not take any oral medications but will continue Alinlify injections.  3. Asthma. Albuterol is available.  4. Pneumonia. She is on Levaquin for 7 days.  5. Eczema. Clobetasol and Kenalog are available.  6. Metabolic syndrome monitoring. Lipid panel, TSH and Hemoglobin A1c are normal.   7. EKG. Sinus tachycardia, QTc 460.  8. Substance abuse. The patient minimizes problems and declines treatment.  9. Pregnancy test. Negative.   10. Disposition. She was discharged with her mother. She will follow up with [facility] on 180 days substance abuse involuntary commitment as there is a history of treatment noncompliance.   Physical Findings: AIMS:  , ,  ,  ,    CIWA:    COWS:     Musculoskeletal: Strength &  Muscle Tone: within normal limits Gait & Station: normal Patient leans: N/A  Psychiatric Specialty Exam: Physical Exam  Nursing note and vitals reviewed. Psychiatric: Her speech is normal and behavior is normal. Thought content normal. Her mood appears anxious. Cognition and memory are normal. She expresses impulsivity.    Review of Systems  Psychiatric/Behavioral: Positive for substance abuse.  All other systems reviewed and are negative.   Blood pressure 94/63, pulse 84, temperature 98.4 F (36.9 C), temperature source Oral, resp. rate 16, height 5\' 5"  (1.651 m), weight 55.3 kg (122 lb), last menstrual period 06/26/2017, SpO2 100 %.Body mass index is 20.3 kg/m.  General Appearance: Casual  Eye Contact:  Good  Speech:  Clear and Coherent  Volume:  Normal  Mood:  Anxious  Affect:  Appropriate  Thought Process:  Goal Directed and Descriptions of Associations: Intact  Orientation:  Full (Time, Place, and Person)  Thought Content:  WDL  Suicidal Thoughts:  No  Homicidal Thoughts:  No  Memory:  Immediate;   Fair Recent;   Fair Remote;   Fair  Judgement:  Poor  Insight:  Lacking  Psychomotor Activity:  Normal  Concentration:  Concentration: Fair and Attention Span: Fair  Recall:  FiservFair  Fund of Knowledge:  Fair  Language:  Fair  Akathisia:  No  Handed:  Right  AIMS (if indicated):     Assets:  Communication Skills Desire for Improvement Financial Resources/Insurance Housing Physical Health Resilience Social Support  ADL's:  Intact  Cognition:  WNL  Sleep:  Number of Hours: 5.75        Has this patient used any form of tobacco in the last 30 days? (Cigarettes, Smokeless Tobacco, Cigars, and/or Pipes) Yes, Yes, A prescription for an FDA-approved tobacco cessation medication was offered at discharge and the patient refused  Blood Alcohol level:  Lab Results  Component Value Date   [facility] CorporationETH <5 07/16/2017   ETH <5 06/27/2017    Metabolic Disorder Labs:  Lab Results   Component Value Date   HGBA1C 5.1 11/06/2016   MPG 100 11/06/2016   Lab Results  Component Value Date   PROLACTIN 21.5 11/06/2016   PROLACTIN 49.4 (H) 08/13/2016   Lab Results  Component Value Date   CHOL 136 07/19/2017   TRIG 74 07/19/2017   HDL 67 07/19/2017   CHOLHDL 2.0 07/19/2017   VLDL 15 07/19/2017   LDLCALC 54 07/19/2017   LDLCALC 33 11/06/2016    See Psychiatric Specialty Exam and Suicide Risk Assessment completed by Attending Physician prior to discharge.  Discharge destination:  Home  Is patient on multiple antipsychotic therapies at discharge:  Yes,   Do you recommend tapering to monotherapy for antipsychotics?  Yes   Has Patient had three or more failed trials of antipsychotic monotherapy by history:  Yes,  Antipsychotic medications that previously failed include:   1.  abilify., 2.  seroquel. and 3.  risperdal.  Recommended Plan for Multiple Antipsychotic Therapies: Taper to monotherapy as described:  Hinda Glatternvega will be discontinue in 21 days.  Discharge Instructions    Diet - low sodium heart healthy    Complete by:  As directed    Increase activity slowly    Complete by:  As directed      Allergies as of 07/22/2017      Reactions   Banana Hives      Medication List    STOP taking these medications   ARIPiprazole 2 MG tablet Commonly known as:  ABILIFY Replaced by:  ARIPiprazole ER 400 MG Srer   mupirocin ointment 2 % Commonly known as:  BACTROBAN   SEROQUEL XR 150 MG 24 hr tablet Generic drug:  QUEtiapine Fumarate   traZODone 100 MG tablet Commonly known as:  DESYREL     TAKE these medications     Indication  albuterol 108 (90 Base) MCG/ACT inhaler Commonly known as:  PROVENTIL HFA;VENTOLIN HFA Inhale 1-2 puffs into the lungs every 4 (four) hours as needed for wheezing or shortness of breath.  Indication:  Asthma   ARIPiprazole ER 400 MG Srer Inject 400 mg into the muscle every 28 (twenty-eight) days. Next injection on  08/17/2017 Replaces:  ARIPiprazole 2 MG tablet  Indication:  Mixed Bipolar Affective Disorder   clobetasol ointment 0.05 % Commonly known as:  TEMOVATE Apply 1 application topically 2 (two) times daily as needed.  Indication:  Skin Disease Successfully Treated with Steroid Therapy   divalproex 500 MG 24 hr tablet Commonly known as:  DEPAKOTE ER Take 2 tablets (1,000 mg total) by mouth at bedtime.  Indication:  Mixed Bipolar Affective Disorder   levofloxacin 500 MG tablet Commonly known as:  LEVAQUIN Take 1 tablet (500 mg total) by mouth daily.  Indication:  Infection caused by Bacteria   mirtazapine 15 MG tablet Commonly known as:  REMERON Take 1 tablet (15 mg total) by mouth at bedtime.  Indication:  Major Depressive Disorder   paliperidone 6 MG 24 hr tablet Commonly known as:  INVEGA Take 1 tablet (6 mg total) by mouth at bedtime.  Indication:  Schizoaffective Disorder   triamcinolone cream 0.1 % Commonly known as:  KENALOG Apply topically 2 (two) times daily.  Indication:  Skin Disease Successfully Treated with Steroid Therapy, eczema      Follow-up Information    [facility] Go on 07/28/2017.   Why:  12:30am, for Hospital Follow up Contact information: [address] [place] [address] [phone]           Follow-up recommendations:  Activity:  as tolerated. Diet:  regular. Other:  keep follow up appointments.  Comments:    Signed: Kristine LineaJolanta Pucilowska, MD 07/22/2017, 10:03 AM

## 2017-07-22 NOTE — Progress Notes (Signed)
Pleasant and cooperative.  Denies SI/HI/AVH.  Bright affect. Discharge instructions given.  Verbalized understanding.  Prescriptions given and personal belongings returned.  Escorted off unit by this Clinical research associatewriter to doctors on call to have courtesy  car transport to bus stop travel home.

## 2017-07-22 NOTE — Progress Notes (Signed)
D: Pt denies SI/HI/AVH. Patient is alert and oriented x 4, denies pain or discomfort. Pt is pleasant and cooperative with treatment plan. Pt appears less anxious and she is interacting with peers and staff appropriately.  A: Pt was offered support and encouragement. Pt was offered  scheduled medications. Pt was encouraged to attend groups. Q 15 minute checks were done for safety.  R:Pt attends groups and interacted well with peers and staff. Pt refused night time medication. Pt receptive to treatment and safety maintained on unit, will continue to monitor.

## 2017-07-22 NOTE — Plan of Care (Signed)
Problem: Coping: Goal: Ability to verbalize feelings will improve Outcome: Progressing Patient verbalized feelings to staff.    

## 2017-07-22 NOTE — [facility] Suicide Risk Assessment (Signed)
[facility]   Principal Problem: Bipolar I disorder, most recent episode mixed, severe with psychotic features [facility](HCC) Discharge Diagnoses:  Patient Active Problem List   Diagnosis Date Noted  . 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 Bipolar I disorder, most recent episode mixed, severe with psychotic features (HCC) [F31.64] 07/18/2017  . Overdose [T50.901A] 07/16/2017  . Aspiration pneumonia (HCC) [J69.0] 07/16/2017  . Acute respiratory failure (HCC) [J96.00] 07/16/2017  . Polysubstance abuse [F19.10] 07/16/2017  . Eczema [L30.9] 05/08/2017  . Borderline personality disorder [F60.3] 11/06/2016  . Cocaine use disorder, severe, dependence (HCC) [F14.20] 11/06/2016  . Cannabis use disorder, moderate, dependence (HCC) [F12.20] 11/06/2016  . Alcohol use disorder, mild, abuse [F10.10] 11/06/2016  . Major depressive disorder, recurrent severe without psychotic features (HCC) [F33.2] 11/06/2016  . Asthma [J45.909] 09/23/2011  . Tobacco use disorder [F17.200] 09/15/2009    Total Time spent with patient: 30 minutes  Musculoskeletal: Strength & Muscle Tone: within normal limits Gait & Station: normal Patient leans: N/A  Psychiatric Specialty Exam: Review of Systems  Psychiatric/Behavioral: Positive for substance abuse.  All other systems reviewed and are negative.   Blood pressure 94/63, pulse 84, temperature 98.4 F (36.9 C), temperature source Oral, resp. rate 16, height 5\' 5"  (1.651 m), weight 55.3 kg (122 lb), last menstrual period 06/26/2017, SpO2 100 %.Body mass index is 20.3 kg/m.  General Appearance: Casual  Eye Contact::  Good  Speech:  Clear and Coherent and Normal Rate409  Volume:  Normal  Mood:  Anxious  Affect:  Appropriate  Thought Process:  Goal Directed and Descriptions of Associations: Intact  Orientation:  Full (Time, Place, and Person)  Thought Content:  WDL  Suicidal Thoughts:  No  Homicidal Thoughts:  No  Memory:  Immediate;    Fair Recent;   Fair Remote;   Fair  Judgement:  Poor  Insight:  Lacking  Psychomotor Activity:  Normal  Concentration:  Fair  Recall:  FiservFair  Fund of Knowledge:Fair  Language: Fair  Akathisia:  No  Handed:  Right  AIMS (if indicated):     Assets:  Communication Skills Desire for Improvement Financial Resources/Insurance Housing Physical Health Resilience Social Support  Sleep:  Number of Hours: 5.75  Cognition: WNL  ADL's:  Intact   Mental Status Per Nursing Assessment::   On Admission:  NA  Demographic Factors:  Adolescent or young adult and Low socioeconomic status  Loss Factors: NA  Historical Factors: Prior suicide attempts, Family history of mental illness or substance abuse and Impulsivity  Risk Reduction Factors:   Responsible for children under 26 years of age, Sense of responsibility to family, Positive social support and Positive therapeutic relationship  Continued Clinical Symptoms:  Bipolar Disorder:   Mixed State Alcohol/Substance Abuse/Dependencies  Cognitive Features That Contribute To Risk:  None    Suicide Risk:  Minimal: No identifiable suicidal ideation.  Patients presenting with no risk factors but with morbid ruminations; may be classified as minimal risk based on the severity of the depressive symptoms  Follow-up Information    [facility] Go on 07/28/2017.   Why:  12:30am, for Hospital Follow up Contact information: [address] [place] [address] [phone]           Plan Of Care/Follow-up recommendations:  Activity:  as tolerated. Diet:  low sodium heart healthy. Other:  keep follow up appointment.  Kristine LineaJolanta Tre Sanker, MD 07/22/2017, 10:00 AM

## 2017-07-24 LAB — HEMOGLOBIN A1C
HEMOGLOBIN A1C: 4.9 % (ref 4.8–5.6)
MEAN PLASMA GLUCOSE: 94 mg/dL

## 2017-09-01 ENCOUNTER — Inpatient Hospital Stay (HOSPITAL_COMMUNITY)
Admission: RE | Admit: 2017-09-01 | Discharge: 2017-09-07 | DRG: 885 | Disposition: A | Discharge status: Home / Self Care | Payer: Medicaid Other | Source: Intra-hospital | Attending: Psychiatry | Admitting: Psychiatry

## 2017-09-01 ENCOUNTER — Emergency Department (HOSPITAL_COMMUNITY)
Admission: EM | Admit: 2017-09-01 | Discharge: 2017-09-01 | Disposition: A | Discharge status: Other Acute Inpatient Hospital | Payer: Medicaid Other | Attending: Emergency Medicine | Admitting: Emergency Medicine

## 2017-09-01 ENCOUNTER — Encounter (HOSPITAL_COMMUNITY): Payer: Self-pay | Admitting: Emergency Medicine

## 2017-09-01 DIAGNOSIS — F1420 Cocaine dependence, uncomplicated: Secondary | ICD-10-CM | POA: Diagnosis present

## 2017-09-01 DIAGNOSIS — Z87891 Personal history of nicotine dependence: Secondary | ICD-10-CM | POA: Diagnosis not present

## 2017-09-01 DIAGNOSIS — F250 Schizoaffective disorder, bipolar type: Secondary | ICD-10-CM | POA: Diagnosis present

## 2017-09-01 DIAGNOSIS — F913 Oppositional defiant disorder: Secondary | ICD-10-CM | POA: Diagnosis present

## 2017-09-01 DIAGNOSIS — F909 Attention-deficit hyperactivity disorder, unspecified type: Secondary | ICD-10-CM | POA: Diagnosis present

## 2017-09-01 DIAGNOSIS — F1910 Other psychoactive substance abuse, uncomplicated: Secondary | ICD-10-CM | POA: Diagnosis present

## 2017-09-01 DIAGNOSIS — R45851 Suicidal ideations: Secondary | ICD-10-CM | POA: Diagnosis not present

## 2017-09-01 DIAGNOSIS — Z62810 Personal history of physical and sexual abuse in childhood: Secondary | ICD-10-CM | POA: Diagnosis not present

## 2017-09-01 DIAGNOSIS — F259 Schizoaffective disorder, unspecified: Secondary | ICD-10-CM | POA: Diagnosis present

## 2017-09-01 DIAGNOSIS — F1220 Cannabis dependence, uncomplicated: Secondary | ICD-10-CM | POA: Diagnosis present

## 2017-09-01 DIAGNOSIS — Z736 Limitation of activities due to disability: Secondary | ICD-10-CM | POA: Diagnosis not present

## 2017-09-01 DIAGNOSIS — Z9119 Patient's noncompliance with other medical treatment and regimen: Secondary | ICD-10-CM | POA: Diagnosis not present

## 2017-09-01 DIAGNOSIS — J45909 Unspecified asthma, uncomplicated: Secondary | ICD-10-CM | POA: Insufficient documentation

## 2017-09-01 DIAGNOSIS — F3164 Bipolar disorder, current episode mixed, severe, with psychotic features: Secondary | ICD-10-CM | POA: Diagnosis not present

## 2017-09-01 DIAGNOSIS — Z91018 Allergy to other foods: Secondary | ICD-10-CM

## 2017-09-01 DIAGNOSIS — Z79899 Other long term (current) drug therapy: Secondary | ICD-10-CM | POA: Insufficient documentation

## 2017-09-01 DIAGNOSIS — G4700 Insomnia, unspecified: Secondary | ICD-10-CM | POA: Diagnosis not present

## 2017-09-01 DIAGNOSIS — R451 Restlessness and agitation: Secondary | ICD-10-CM | POA: Diagnosis not present

## 2017-09-01 DIAGNOSIS — Z22322 Carrier or suspected carrier of Methicillin resistant Staphylococcus aureus: Secondary | ICD-10-CM | POA: Diagnosis not present

## 2017-09-01 DIAGNOSIS — R443 Hallucinations, unspecified: Secondary | ICD-10-CM | POA: Diagnosis not present

## 2017-09-01 DIAGNOSIS — R450 Nervousness: Secondary | ICD-10-CM | POA: Diagnosis not present

## 2017-09-01 DIAGNOSIS — F419 Anxiety disorder, unspecified: Secondary | ICD-10-CM | POA: Diagnosis not present

## 2017-09-01 DIAGNOSIS — Z915 Personal history of self-harm: Secondary | ICD-10-CM | POA: Diagnosis not present

## 2017-09-01 DIAGNOSIS — F329 Major depressive disorder, single episode, unspecified: Secondary | ICD-10-CM | POA: Diagnosis present

## 2017-09-01 DIAGNOSIS — F603 Borderline personality disorder: Secondary | ICD-10-CM | POA: Diagnosis present

## 2017-09-01 DIAGNOSIS — F4310 Post-traumatic stress disorder, unspecified: Secondary | ICD-10-CM | POA: Diagnosis not present

## 2017-09-01 DIAGNOSIS — R440 Auditory hallucinations: Secondary | ICD-10-CM | POA: Diagnosis not present

## 2017-09-01 DIAGNOSIS — Z560 Unemployment, unspecified: Secondary | ICD-10-CM

## 2017-09-01 DIAGNOSIS — F29 Unspecified psychosis not due to a substance or known physiological condition: Secondary | ICD-10-CM | POA: Diagnosis not present

## 2017-09-01 DIAGNOSIS — Z818 Family history of other mental and behavioral disorders: Secondary | ICD-10-CM | POA: Diagnosis not present

## 2017-09-01 DIAGNOSIS — F39 Unspecified mood [affective] disorder: Secondary | ICD-10-CM | POA: Diagnosis not present

## 2017-09-01 LAB — COMPREHENSIVE METABOLIC PANEL
ALBUMIN: 3.3 g/dL — AB (ref 3.5–5.0)
ALK PHOS: 53 U/L (ref 38–126)
ALT: 13 U/L — AB (ref 14–54)
AST: 23 U/L (ref 15–41)
Anion gap: 8 (ref 5–15)
BILIRUBIN TOTAL: 0.2 mg/dL — AB (ref 0.3–1.2)
BUN: 9 mg/dL (ref 6–20)
CALCIUM: 8.2 mg/dL — AB (ref 8.9–10.3)
CO2: 21 mmol/L — ABNORMAL LOW (ref 22–32)
CREATININE: 0.99 mg/dL (ref 0.44–1.00)
Chloride: 111 mmol/L (ref 101–111)
GFR calc Af Amer: >60 mL/min (ref >60)
GLUCOSE: 65 mg/dL (ref 65–99)
Potassium: 3.9 mmol/L (ref 3.5–5.1)
SODIUM: 140 mmol/L (ref 135–145)
TOTAL PROTEIN: 6.1 g/dL — AB (ref 6.5–8.1)

## 2017-09-01 LAB — CBC
HEMATOCRIT: 37.1 % (ref 36.0–46.0)
Hemoglobin: 12 g/dL (ref 12.0–15.0)
MCH: 30.8 pg (ref 26.0–34.0)
MCHC: 32.3 g/dL (ref 30.0–36.0)
MCV: 95.1 fL (ref 78.0–100.0)
PLATELETS: 297 10*3/uL (ref 150–400)
RBC: 3.9 MIL/uL (ref 3.87–5.11)
RDW: 13.2 % (ref 11.5–15.5)
WBC: 6.6 10*3/uL (ref 4.0–10.5)

## 2017-09-01 LAB — ETHANOL

## 2017-09-01 LAB — RAPID URINE DRUG SCREEN, HOSP PERFORMED
AMPHETAMINES: NOT DETECTED
Barbiturates: POSITIVE — AB
Benzodiazepines: NOT DETECTED
Cocaine: POSITIVE — AB
Opiates: NOT DETECTED
Tetrahydrocannabinol: POSITIVE — AB

## 2017-09-01 LAB — I-STAT BETA HCG BLOOD, ED (MC, WL, AP ONLY)

## 2017-09-01 LAB — ACETAMINOPHEN LEVEL: Acetaminophen (Tylenol), Serum: <10 ug/mL — ABNORMAL LOW (ref 10–30)

## 2017-09-01 LAB — SALICYLATE LEVEL: Salicylate Lvl: <7 mg/dL (ref 2.8–30.0)

## 2017-09-01 MED ORDER — ACETAMINOPHEN 325 MG PO TABS
650.0000 mg | ORAL_TABLET | Freq: Four times a day (QID) | ORAL | Status: DC | PRN
  Administered 2017-09-06: 650 mg via ORAL
  Filled 2017-09-01: qty 2

## 2017-09-01 MED ORDER — HYDROXYZINE HCL 25 MG PO TABS
25.0000 mg | ORAL_TABLET | Freq: Three times a day (TID) | ORAL | Status: DC | PRN
  Administered 2017-09-01 – 2017-09-03 (×3): 25 mg via ORAL
  Filled 2017-09-01 (×4): qty 1

## 2017-09-01 MED ORDER — ALUM & MAG HYDROXIDE-SIMETH 200-200-20 MG/5ML PO SUSP: 30.0000 mL | ORAL | Status: DC | PRN

## 2017-09-01 MED ORDER — ALUM & MAG HYDROXIDE-SIMETH 200-200-20 MG/5ML PO SUSP: 30.0000 mL | Freq: Four times a day (QID) | ORAL | Status: DC | PRN

## 2017-09-01 MED ORDER — TRAZODONE HCL 50 MG PO TABS
50.0000 mg | ORAL_TABLET | Freq: Every evening | ORAL | Status: DC | PRN
  Filled 2017-09-01: qty 1

## 2017-09-01 MED ORDER — MAGNESIUM HYDROXIDE 400 MG/5ML PO SUSP: 30.0000 mL | Freq: Every day | ORAL | Status: DC | PRN

## 2017-09-01 MED ORDER — ACETAMINOPHEN 325 MG PO TABS: 650.0000 mg | ORAL_TABLET | ORAL | Status: DC | PRN

## 2017-09-01 MED ORDER — ONDANSETRON HCL 4 MG PO TABS: 4.0000 mg | ORAL_TABLET | Freq: Three times a day (TID) | ORAL | Status: DC | PRN

## 2017-09-01 NOTE — ED Triage Notes (Signed)
Pt reports she wants to commit suicide, states she has felt like this for a year, states "I dont know" when asked if she has a plan or if she has ever been treated for this before. Pt poor historian.
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
## 2017-09-01 NOTE — Progress Notes (Signed)
[facility].  Ferne ReusJustina Okonkwo is the accepting provider.  Dr. Elna BreslowEappen is the attending provider. Call report to [phone].    Patient is voluntary and may be [facility].   CSW requests to be notified if pt requires IVC. [facility] to [facility] at 9:00 PM.  Carney BernJean T. Kaylyn LimSutter, MSW, LCSWA Disposition Clinical Social Work [phone] (cell) [phone] (office)

## 2017-09-01 NOTE — ED Notes (Signed)
Valuables envelope retrieved from security, placed in pt's labeled belongings bag on top of blue suitcase. ALL belongings placed at desk - ready for transport. Called pt's mother, Renea Eevelyn, and advised of tx plan per pt's request.

## 2017-09-01 NOTE — Progress Notes (Signed)
Report given by Otto Herbreka, RN. Pt oriented to the unit. Pt is currently eating in her room. Pt appears animated and calm. No c/o. No abnormal s/s.Will monitor.

## 2017-09-01 NOTE — ED Provider Notes (Signed)
[facility]-EM[facility]GENCY DEPT Provider Note   CSN: [id number] Arrival date & time: 09/01/17  [id number]     History   Chief Complaint Chief Complaint  Patient presents with  . Suicidal    HPI Bridget Carpenter is a 26 y.o. female.  26yo F w/ PMH including schizoaffective d/o, PTSD, polysubstance abuse who p/w depression and SI. Pt reports a long history of depression and not wanting to be alive anymore. She denies any specific suicide plan but she does report a history of suicide attempt by overdose about one month ago. She denies any recent suicide attempts. She states that she is sometimes paranoid, thinking that people are trying to hurt her. She denies any hallucinations or homicidal ideation. She has not been on any medications for a while. She states "I want to go to a place that will let me keep my clothes and my toiletries. [facility]." She reports occasional drug use, no alcohol use.   The history is provided by the patient.    Past Medical History:  Diagnosis Date  . Asthma   . Depression 2009   Inpatient psych admission for SI, dissociative fugue  . Dissociative disorder or reaction 2009  . Eczema   . H/O: suicide attempt   . ODD (oppositional defiant disorder)   . PTSD (post-traumatic stress disorder)   . Schizoaffective disorder (HCC)   . Substance abuse     Patient Active Problem List   Diagnosis Date Noted  . MRSA carrier 07/18/2017  . Bipolar I disorder, most recent episode mixed, severe with psychotic features (HCC) 07/18/2017  . Overdose 07/16/2017  . Aspiration pneumonia (HCC) 07/16/2017  . Acute respiratory failure (HCC) 07/16/2017  . Polysubstance abuse 07/16/2017  . Eczema 05/08/2017  . Borderline personality disorder 11/06/2016  . Cocaine use disorder, severe, dependence (HCC) 11/06/2016  . Cannabis use disorder, moderate, dependence (HCC) 11/06/2016  . Alcohol use disorder, mild, abuse  11/06/2016  . Major depressive disorder, recurrent severe without psychotic features (HCC) 11/06/2016  . Asthma 09/23/2011  . Tobacco use disorder 09/15/2009    Past Surgical History:  Procedure Laterality Date  . ADENOIDECTOMY    . TONSILLECTOMY      OB History    No data available       Home Medications    Prior to Admission medications   Medication Sig Start Date End Date Taking? Authorizing Provider  albuterol (PROVENTIL HFA;VENTOLIN HFA) 108 (90 Base) MCG/ACT inhaler Inhale 1-2 puffs into the lungs every 4 (four) hours as needed for wheezing or shortness of breath. 05/11/17   [name], Andrea, MD  ARIPiprazole ER 400 MG SRER Inject 400 mg into the muscle every 28 (twenty-eight) days. Next injection on 08/17/2017 08/17/17   Pucilowska, Braulio ConteJolanta B, MD  clobetasol ointment (TEMOVATE) 0.05 % Apply 1 application topically 2 (two) times daily as needed. 07/22/17   Pucilowska, Braulio ConteJolanta B, MD  divalproex (DEPAKOTE ER) 500 MG 24 hr tablet Take 2 tablets (1,000 mg total) by mouth at bedtime. 07/22/17   Pucilowska, Braulio ConteJolanta B, MD  mirtazapine (REMERON) 15 MG tablet Take 1 tablet (15 mg total) by mouth at bedtime. 07/22/17   Pucilowska, Jolanta B, MD  paliperidone (INVEGA) 6 MG 24 hr tablet Take 1 tablet (6 mg total) by mouth at bedtime. 07/22/17   Pucilowska, Jolanta B, MD  triamcinolone cream (KENALOG) 0.1 % Apply topically 2 (two) times daily. 07/22/17   Pucilowska, Jolanta B,  MD    Family History Family History  Problem Relation Age of Onset  . Adopted: Yes    Social History Social History  Substance Use Topics  . Smoking status: Former Smoker    Packs/day: 0.50    Years: 2.00    Types: Cigarettes  . Smokeless tobacco: Never Used  . Alcohol use Yes     Comment: sometimes     Allergies   Banana   Review of Systems Review of Systems All other systems reviewed and are negative except that which was mentioned in HPI   Physical Exam Updated Vital Signs BP 102/61    Pulse 69   Temp 98.2 F (36.8 C) (Oral)   Resp 15   SpO2 100%   Physical Exam  Constitutional: She is oriented to person, place, and time. She appears well-developed and well-nourished. No distress.  Eating sandwich  HENT:  Head: Normocephalic and atraumatic.  Eyes: Conjunctivae are normal.  Neck: Neck supple.  Cardiovascular: Normal rate, regular rhythm and normal heart sounds.   No murmur heard. Pulmonary/Chest: Effort normal.  Coarse breath sounds b/l  Musculoskeletal: She exhibits no deformity.  Neurological: She is alert and oriented to person, place, and time.  Fluent speech  Skin: Skin is warm and dry.  Psychiatric:  Flat, bizarre affect  Nursing note and vitals reviewed.    ED Treatments / Results  Labs (all labs ordered are listed, but only abnormal results are displayed) Labs Reviewed  COMPREHENSIVE METABOLIC PANEL - Abnormal; Notable for the following:       Result Value   CO2 21 (*)    Calcium 8.2 (*)    Total Protein 6.1 (*)    Albumin 3.3 (*)    ALT 13 (*)    Total Bilirubin 0.2 (*)    All other components within normal limits  ACETAMINOPHEN LEVEL - Abnormal; Notable for the following:    Acetaminophen (Tylenol), Serum <10 (*)    All other components within normal limits  RAPID URINE DRUG SCREEN, HOSP PERFORMED - Abnormal; Notable for the following:    Cocaine POSITIVE (*)    Tetrahydrocannabinol POSITIVE (*)    Barbiturates POSITIVE (*)    All other components within normal limits  ETHANOL  SALICYLATE LEVEL  CBC  I-STAT BETA HCG BLOOD, ED ([facility], [facility], [facility] ONLY)    EKG  EKG Interpretation None       Radiology No results found.  Procedures Procedures (including critical care time)  Medications Ordered in ED Medications  acetaminophen (TYLENOL) tablet 650 mg (not administered)  ondansetron (ZOFRAN) tablet 4 mg (not administered)  alum & mag hydroxide-simeth (MAALOX/MYLANTA) 200-200-20 MG/5ML suspension 30 mL (not administered)      Initial Impression / Assessment and Plan / ED Course  I have reviewed the triage vital signs and the nursing notes.  Pertinent labs  that were available during my care of the patient were reviewed by me and considered in my medical decision making (see chart for details).     Pt w/ h/o depression and previous suicide attempt last month requiring ICU admission who p/w ongoing SI. She is comfortable on exam w/ reassuring VS. Labs show UDS positive for multiple substances but otherwise unremarkable. She is medically clear for psychiatric evaluation. I have contacted TTS and patient's disposition will be determined by psychiatry team recommendations.  Final Clinical Impressions(s) / ED Diagnoses   Final diagnoses:  None    New Prescriptions New Prescriptions   No medications on file  Shoshanah Dapper, Ambrose Finland, MD 09/01/17 [phone]

## 2017-09-01 NOTE — ED Notes (Signed)
TTS in progress 

## 2017-09-01 NOTE — ED Notes (Signed)
staffing called for sitter and security called to wand pt

## 2017-09-01 NOTE — ED Notes (Signed)
[facility] completed   Eber HongMiller, Javayah Magaw, MD 09/01/17 [phone]

## 2017-09-01 NOTE — ED Notes (Signed)
[facility] pt wearing burgundy scrubs - pt ambulatory to bed.

## 2017-09-02 ENCOUNTER — Encounter (HOSPITAL_COMMUNITY): Payer: Self-pay | Admitting: *Deleted

## 2017-09-02 DIAGNOSIS — Z62810 Personal history of physical and sexual abuse in childhood: Secondary | ICD-10-CM

## 2017-09-02 DIAGNOSIS — R443 Hallucinations, unspecified: Secondary | ICD-10-CM

## 2017-09-02 DIAGNOSIS — F3164 Bipolar disorder, current episode mixed, severe, with psychotic features: Secondary | ICD-10-CM | POA: Diagnosis present

## 2017-09-02 DIAGNOSIS — R450 Nervousness: Secondary | ICD-10-CM

## 2017-09-02 DIAGNOSIS — F419 Anxiety disorder, unspecified: Secondary | ICD-10-CM

## 2017-09-02 DIAGNOSIS — Z736 Limitation of activities due to disability: Secondary | ICD-10-CM

## 2017-09-02 DIAGNOSIS — G4700 Insomnia, unspecified: Secondary | ICD-10-CM

## 2017-09-02 DIAGNOSIS — F4310 Post-traumatic stress disorder, unspecified: Secondary | ICD-10-CM | POA: Diagnosis present

## 2017-09-02 DIAGNOSIS — Z560 Unemployment, unspecified: Secondary | ICD-10-CM

## 2017-09-02 DIAGNOSIS — F250 Schizoaffective disorder, bipolar type: Secondary | ICD-10-CM

## 2017-09-02 MED ORDER — DIPHENHYDRAMINE HCL 25 MG PO CAPS
25.0000 mg | ORAL_CAPSULE | Freq: Four times a day (QID) | ORAL | Status: DC | PRN
  Administered 2017-09-02: 25 mg via ORAL
  Filled 2017-09-02: qty 1

## 2017-09-02 MED ORDER — BUPROPION HCL 75 MG PO TABS
75.0000 mg | ORAL_TABLET | Freq: Every morning | ORAL | Status: DC
  Administered 2017-09-02 – 2017-09-07 (×6): 75 mg via ORAL
  Filled 2017-09-02 (×7): qty 1

## 2017-09-02 MED ORDER — TRAZODONE HCL 100 MG PO TABS
100.0000 mg | ORAL_TABLET | Freq: Every evening | ORAL | Status: DC | PRN
  Administered 2017-09-02 – 2017-09-06 (×5): 100 mg via ORAL
  Filled 2017-09-02 (×4): qty 1

## 2017-09-02 MED ORDER — GABAPENTIN 100 MG PO CAPS
100.0000 mg | ORAL_CAPSULE | ORAL | Status: DC
  Administered 2017-09-02 – 2017-09-07 (×15): 100 mg via ORAL
  Filled 2017-09-02 (×19): qty 1

## 2017-09-02 MED ORDER — ARIPIPRAZOLE 5 MG PO TABS
5.0000 mg | ORAL_TABLET | Freq: Every day | ORAL | Status: DC
  Administered 2017-09-02 – 2017-09-03 (×2): 5 mg via ORAL
  Filled 2017-09-02 (×4): qty 1

## 2017-09-02 MED ORDER — OLANZAPINE 10 MG IM SOLR: 5.0000 mg | Freq: Three times a day (TID) | INTRAMUSCULAR | Status: DC | PRN

## 2017-09-02 MED ORDER — OLANZAPINE 5 MG PO TBDP
5.0000 mg | ORAL_TABLET | Freq: Three times a day (TID) | ORAL | Status: DC | PRN
  Administered 2017-09-03 – 2017-09-06 (×6): 5 mg via ORAL
  Filled 2017-09-02 (×6): qty 1

## 2017-09-02 MED ORDER — ARIPIPRAZOLE ER 400 MG IM SRER
400.0000 mg | INTRAMUSCULAR | Status: DC
Start: 2017-09-02 — End: 2017-09-07
  Administered 2017-09-02: 400 mg via INTRAMUSCULAR

## 2017-09-02 MED ORDER — HYDROXYZINE HCL 50 MG PO TABS
50.0000 mg | ORAL_TABLET | Freq: Once | ORAL | Status: AC
  Administered 2017-09-02: 50 mg via ORAL
  Filled 2017-09-02 (×2): qty 1

## 2017-09-02 NOTE — Tx Team (Signed)
Initial Treatment Plan 09/02/2017 3:17 AM Bridget [place] [name] [id number]    PATIENT STRESSORS: Other: Paranoia/Psychosis   PATIENT STRENGTHS: Manufacturing systems engineerCommunication skills Physical Health Supportive family/friends   PATIENT IDENTIFIED PROBLEMS: Psychosis  "To go home"  "To get help with the voices"                 DISCHARGE CRITERIA:  Ability to meet basic life and health needs Improved stabilization in mood, thinking, and/or behavior Motivation to continue treatment in a less acute level of care Need for constant or close observation no longer present Verbal commitment to aftercare and medication compliance  PRELIMINARY DISCHARGE PLAN: Outpatient therapy Return to previous living arrangement  PATIENT/FAMILY INVOLVEMENT: This treatment plan has been presented to and reviewed with the patient, Bridget Carpenter.  The patient and family have been given the opportunity to ask questions and make suggestions.  Bridget Carpenter, Bridget Clauson P, RN 09/02/2017, 3:17 AM
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
## 2017-09-02 NOTE — Progress Notes (Signed)
D: Pt denies SI/HI, +ve AH. Pt is pleasant and cooperative. Pt very confrontational at times,  Pt avoids writer at times, but will come back and apologize. Pt stated she would like to be taking Cogentin .   A: Pt was offered support and encouragement. Pt was given scheduled medications. Pt was encourage to attend groups. Q 15 minute checks were done for safety.   R:Pt attends groups and interacts well with peers and staff. Pt is taking medication. Pt receptive to treatment and safety maintained on unit.

## 2017-09-02 NOTE — Progress Notes (Signed)
D:  Patient's self inventory sheet, patient has fair sleep, sleep medication helpful.  Fair appetite, low energy level, poor concentration.  Rated depression, hopeless and anxiety #10.  Withdrawals, cravings, agitation, nausea, runny nose, irritability.  Denied SI.  Physical problems, pain, headaches, rash, blurred vision, sore throat.  Worst pain #10 from head to toe.  Goal to attend groups/functions, gym, outside, join groups, eat in cafeteria.  Needs medicine for substance/withdrawals. A:  Medications administered per MD orders. R:  Patient denied SI and HI while talking to nurse today.  Denied A/V hallucinations. Patient has not complained to staff today about pain/distress.   Patient did say that she was itching before dinner.  That she put hospital soap on her body, laid down and slept about an hour, woke up and told staff that she was itching.  Staff encouraged patient to shower and remove all soap.   Vistaril given for anxiety and order obtained for benadryl.

## 2017-09-02 NOTE — Progress Notes (Signed)
Patient was given benadryl after dinner for itching.  Patient stated she was feeling better now.  Encouraged patient not to leave soap on her skin.  Wash soap off hands/body as soon as possible.  Patient agreed.

## 2017-09-02 NOTE — [facility] Group Notes (Signed)
LCSW Group Therapy Note   09/02/2017 1:15pm   Type of Therapy and Topic:  Group Therapy:  Positive Affirmations   Participation Level:  Minimal  Description of Group: This group addressed positive affirmation toward self and others. Patients went around the room and identified two positive things about themselves and two positive things about a peer in the room. Patients reflected on how it felt to share something positive with others, to identify positive things about themselves, and to hear positive things from others. Patients were encouraged to have a daily reflection of positive characteristics or circumstances.  Therapeutic Goals 1. Patient will verbalize two of their positive qualities 2. Patient will demonstrate empathy for others by stating two positive qualities about a peer in the group 3. Patient will verbalize their feelings when voicing positive self affirmations and when voicing positive affirmations of others 4. Patients will discuss the potential positive impact on their wellness/recovery of focusing on positive traits of self and others. Summary of Patient Progress:  Bridget Carpenter attended group but did not remain in group the entire time.  She slept for most of the group and was not an active participant.  She has limited insight into her resilience.    Therapeutic Modalities Cognitive Behavioral Therapy Motivational Interviewing  Carlynn Heraldngel M Renetta Suman, Student-Social Work 09/02/2017 1:28 PM

## 2017-09-02 NOTE — Progress Notes (Signed)
Admission Note:  26 year old female who presents, in no acute distress, for the treatment of Psychosis and Paranoia. Patient appears anxious during admission. Patient was cooperative with admission process. Patient denies SI on admission. Patient reports Auditory Hallucinations stating "It's the spirits. The spirits are talking to me all the time. Sleep quiets the voices".  Patient reports that she has been experiencing auditory hallucinations for "5-7 years".  Patient verbalizes paranoia stating "I'm paranoid. I think people are trying to kill me and my family".  Patient reports increasing depression due to "battling with my sexuality. I feel guilty that I don't like men".  Patient reports hx of drug use reporting "marijuana, cocaine, meth, and heroin.  Patient reports marijuana use daily, cocaine, last use "3 days ago", Meth and Heroin , "a year ago".  Patient was assessed and found to be clear of any abnormal marks apart from eczema to her feet and hands bilateral.  Patient searched and no contraband found, POC and unit policies explained and understanding verbalized. Consents obtained. Food and fluids offered, and fluids accepted. Patient had no additional questions or concerns.

## 2017-09-02 NOTE — Progress Notes (Signed)
Recreation Therapy Notes  Date:  09/02/17 Time: 1000 Location: 500 Hall Dayroom  Group Topic: Communication  Goal Area(s) Addresses:  Patient will effectively communicate with peers in group.  Patient will verbalize benefit of healthy communication. Patient will verbalize positive effect of healthy communication on post d/c goals.  Patient will identify communication techniques that made activity effective for group.   Intervention: Blank paper, pencils, geometrical pictures  Activity: Back to back Drawing.  Patients were divided into teams of two.  Patients were to sit back to back.  One person would start off as the speaker, while the other person would be the listener.  The speaker was given a geometrical picture to describe to their partner.  The listener had to recreate the picture being described to them to the best of their abilities.  However, the listener could not ask any questions.  Patients would switch roles on the second round.   Education: Communication, Discharge Planning  Education Outcome: Acknowledges understanding/In group clarification offered/Needs additional education.   Clinical Observations/Feedback: Pt did not attend group.   Caroll RancherMarjette Shardae Kleinman, LRT/CTRS         Caroll RancherLindsay, Anwar Crill A 09/02/2017 12:01 PM
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
## 2017-09-02 NOTE — Plan of Care (Signed)
Problem: Safety: Goal: Periods of time without injury will increase Outcome: Progressing Pt safe on the unit at this time   

## 2017-09-02 NOTE — Progress Notes (Signed)
Recreation Therapy Notes  INPATIENT RECREATION THERAPY ASSESSMENT  Patient Details Name: Bridget Carpenter MRN: [id number] DOB: 08/03/1991 Today's Date: 09/02/2017  Patient Stressors: Family, Friends, Work  Pt stated she came voluntarily because of her substance abuse.  Coping Skills:   Isolate, Substance Abuse, Avoidance, Music, Other (Comment) Architectural technologist(Sing)  Personal Challenges: Anger, Communication, Concentration, Decision-Making, Expressing Yourself, Relationships, Self-Esteem/Confidence, Social Interaction, Stress Management, Time Management, Trusting Others, Work Nutritional therapisterformance  Leisure Interests (2+):  Music - Singing  Biochemist, clinicalAwareness of Community Resources:  No  Patient Strengths:  Being considerate; generous  Patient Identified Areas of Improvement:  Assertive; how to speak to people so they will like me  Current Recreation Participation:  Not often  Patient Goal for Hospitalization:  "To do every program while here"  [name]  Current SI (including self-harm):  No  Current HI:  No  Consent to Intern Participation: N/A   [name], LRT/CTRS  Caroll RancherLindsay, Melisa Donofrio A 09/02/2017, 2:33 PM
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
## 2017-09-03 DIAGNOSIS — F4310 Post-traumatic stress disorder, unspecified: Secondary | ICD-10-CM

## 2017-09-03 DIAGNOSIS — F1420 Cocaine dependence, uncomplicated: Secondary | ICD-10-CM

## 2017-09-03 DIAGNOSIS — Z818 Family history of other mental and behavioral disorders: Secondary | ICD-10-CM

## 2017-09-03 DIAGNOSIS — Z87891 Personal history of nicotine dependence: Secondary | ICD-10-CM

## 2017-09-03 DIAGNOSIS — R451 Restlessness and agitation: Secondary | ICD-10-CM

## 2017-09-03 DIAGNOSIS — F1220 Cannabis dependence, uncomplicated: Secondary | ICD-10-CM

## 2017-09-03 DIAGNOSIS — F3164 Bipolar disorder, current episode mixed, severe, with psychotic features: Principal | ICD-10-CM

## 2017-09-03 DIAGNOSIS — F39 Unspecified mood [affective] disorder: Secondary | ICD-10-CM

## 2017-09-03 NOTE — BHH Group Notes (Signed)
LCSW Group Therapy Note   09/03/2017 1:15pm   Type of Therapy and Topic:  Group Therapy:  Positive Affirmations   Participation Level:  Minimal  Description of Group: This group addressed positive affirmation toward self and others. Patients went around the room and identified two positive things about themselves and two positive things about a peer in the room. Patients reflected on how it felt to share something positive with others, to identify positive things about themselves, and to hear positive things from others. Patients were encouraged to have a daily reflection of positive characteristics or circumstances.  Therapeutic Goals 1. Patient will verbalize two of their positive qualities 2. Patient will demonstrate empathy for others by stating two positive qualities about a peer in the group 3. Patient will verbalize their feelings when voicing positive self affirmations and when voicing positive affirmations of others 4. Patients will discuss the potential positive impact on their wellness/recovery of focusing on positive traits of self and others. Summary of Patient Progress:  [name] attended group, but did not stay for the entire session.  She left the group early and chose not to share her story of recovery.   Therapeutic Modalities Cognitive Behavioral Therapy Motivational Interviewing  Carlynn Heraldngel M Gervase Colberg, Student-Social Work 09/03/2017 1:24 PM

## 2017-09-03 NOTE — Progress Notes (Signed)
Recreation Therapy Notes  Date: 09/03/17 Time: 1000 Location: 500 Hall Dayroom  Group Topic: Leisure Education  Goal Area(s) Addresses:  Patient will identify positive leisure activities.  Patient will identify one positive benefit of participation in leisure activities.   Intervention: Dry erase board, dry erase marker, eraser, coffee can with leisure activities on strips of paper  Activity: Pictionary.  Patients were divided into teams of two.  Each team, when it's their turn, would go to the board, draw a slip of paper from the can and draw whatever was on the paper.  That team would have 1 minute to guess the picture.  If the team did not guess the picture, the other team got a chance to steal the point.  The team with the most points wins.  Education:  Leisure Education, Building control surveyorDischarge Planning  Education Outcome: Acknowledges education/In group clarification offered/Needs additional education  Clinical Observations/Feedback: Pt did not attend group.   Caroll RancherMarjette Siera Beyersdorf, LRT/CTRS         Caroll RancherLindsay, Wayne Wicklund A 09/03/2017 12:07 PM
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
## 2017-09-03 NOTE — Progress Notes (Signed)
DAR NOTE: Patient presents with anxious affect and mood.  Reports suicidal thoughts, pain, auditory hallucination.  Verbally contracts for safety. Reports symptoms of cravings, nausea, agitation, runny nose, and irritability on self inventory form.  Rates depression at 10, hopelessness at 10, and anxiety at 10.  Maintained on routine safety checks.  Medications given as prescribed.  Support and encouragement offered as needed.  Attended group and participated.  States goal for today is "to stay calm."  Patient observed socializing with peers in the dayroom.  Vistaril 25 mg given for complain of anxiety with fair effect.

## 2017-09-03 NOTE — Progress Notes (Signed)
Adult Psychoeducational Group Note  Date:  09/03/2017 Time:  9:59 PM  Group Topic/Focus:  Wrap-Up Group:   The focus of this group is to help patients review their daily goal of treatment and discuss progress on daily workbooks.  Participation Level:  Active  Participation Quality:  Appropriate  Affect:  Appropriate  Cognitive:  Alert  Insight: Appropriate  Engagement in Group:  Engaged  Modes of Intervention:  Discussion  Additional Comments:  Patient stated having a good day. Patient's goal for today was to stay calm. Patient stated she had a little bit of anxiety today.  Bridget Carpenter L Reice Bienvenue 09/03/2017, 9:59 PM

## 2017-09-03 NOTE — Progress Notes (Signed)
[facility]  09/03/2017 3:46 PM Bridget Arab [place] [name]  MRN:  [id number]  Subjective: Bridget [place] reports, "'I'm doing alright. I wish I can have a lot more coffee than being provided. I drink a lot of coffee, it helps me. I'm doing fairly well here. I don't have anyone my age to relate to here. I wish that I have some kind of peer support, someone to talk to, you know vent with, a little. I know when I get discharged, part of my discharge recommendation should be counseling & peer support person to talk to from time to time. I feel like my face is stiff. Can I have cogentin ordered please? This happens to me when I take Abilify. I went to group today. It helps to learn more coping skills because you never know when you will need them".  Objective: Bridget [place] is seen, chart reviewed. Discussed her case with the treatment team. She is alert, oriented x 4. She is visible on the unit, participating in the group sessions. She is not disruptive on the unit. She is cooperative with staff. She presents today more improved than on admission. She continue to need medication regimen for mood control. Is complaining of facial stiffness, blamed it on the Abilify. She is requesting some cogentin order. Staff continue to provide Bridget [place] with support & encouragement. She has been referred to the peer support specialist. Will be with the peer support specialist later.  Principal Problem: Bipolar disorder, curr episode mixed, severe, with psychotic features (HCC)  Diagnosis:   Patient Active Problem List   Diagnosis Date Noted  . PTSD (post-traumatic stress disorder) [F43.10] 09/02/2017  . Bipolar disorder, curr episode mixed, severe, with psychotic features (HCC) [F31.64] 09/02/2017  . 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 Overdose [T50.901A] 07/16/2017  . Aspiration pneumonia (HCC) [J69.0] 07/16/2017  . Acute respiratory failure (HCC) [J96.00] 07/16/2017  . Polysubstance abuse [F19.10] 07/16/2017  . Eczema  [L30.9] 05/08/2017  . Borderline personality disorder [F60.3] 11/06/2016  . Cocaine use disorder, severe, dependence (HCC) [F14.20] 11/06/2016  . Cannabis use disorder, moderate, dependence (HCC) [F12.20] 11/06/2016  . Alcohol use disorder, mild, abuse [F10.10] 11/06/2016  . Asthma [J45.909] 09/23/2011  . Tobacco use disorder [F17.200] 09/15/2009   Total Time spent with patient: 25 minutes  Past Psychiatric History: Bipolar disorder, Polysubstance use disorder.  Past Medical History:  Past Medical History:  Diagnosis Date  . Asthma   . Depression 2009   Inpatient psych admission for SI, dissociative fugue  . Dissociative disorder or reaction 2009  . Eczema   . H/O: suicide attempt   . ODD (oppositional defiant disorder)   . PTSD (post-traumatic stress disorder)   . Schizoaffective disorder (HCC)   . Substance abuse     Past Surgical History:  Procedure Laterality Date  . ADENOIDECTOMY    . TONSILLECTOMY     Family History:  Family History  Problem Relation Age of Onset  . Adopted: Yes  . Mental illness Mother   . Mental illness Father    Family Psychiatric  History: See H&P.  Social History:  History  Alcohol Use  . Yes    Comment: sometimes     History  Drug Use No    Social History   Social History  . Marital status: Single    Spouse name: N/A  . Number of children: N/A  . Years of education: N/A   Social History Main Topics  . Smoking status: Former Smoker    Packs/day: 0.50  Years: 2.00    Types: Cigarettes  . Smokeless tobacco: Never Used  . Alcohol use Yes     Comment: sometimes  . Drug use: No  . Sexual activity: Yes   Other Topics Concern  . None   Social History Narrative  . None   Additional Social History:   Sleep: Good  Appetite:  Good  Current Medications: Current Facility-Administered Medications  Medication Dose Route Frequency Provider Last Rate Last Dose  . acetaminophen (TYLENOL) tablet 650 mg  650 mg Oral Q6H PRN  Okonkwo, Justina A, NP      . alum & mag hydroxide-simeth (MAALOX/MYLANTA) 200-200-20 MG/5ML suspension 30 mL  30 mL Oral Q4H PRN Okonkwo, Justina A, NP      . ARIPiprazole (ABILIFY) tablet 5 mg  5 mg Oral QHS Eappen, Saramma, MD   5 mg at 09/02/17 2241  . ARIPiprazole ER SRER 400 mg  400 mg Intramuscular Q30 days Jomarie Longs, MD   400 mg at 09/02/17 1432  . buPROPion [facility]) tablet 75 mg  75 mg Oral q morning - 10a Jomarie Longs, MD   75 mg at 09/03/17 1119  . diphenhydrAMINE (BENADRYL) capsule 25 mg  25 mg Oral Q6H PRN Rankin, Shuvon B, NP   25 mg at 09/02/17 1829  . gabapentin (NEURONTIN) capsule 100 mg  100 mg Oral [id number] Eappen, Saramma, MD   100 mg at 09/03/17 1119  . hydrOXYzine (ATARAX/VISTARIL) tablet 25 mg  25 mg Oral TID PRN Beryle Lathe, Justina A, NP   25 mg at 09/02/17 1738  . magnesium hydroxide (MILK OF MAGNESIA) suspension 30 mL  30 mL Oral Daily PRN Okonkwo, Justina A, NP      . OLANZapine zydis (ZYPREXA) disintegrating tablet 5 mg  5 mg Oral TID PRN Jomarie Longs, MD       Or  . OLANZapine (ZYPREXA) injection 5 mg  5 mg Intramuscular TID PRN Jomarie Longs, MD      . traZODone (DESYREL) tablet 100 mg  100 mg Oral QHS PRN Donell Sievert E, PA-C   100 mg at 09/02/17 2241   Lab Results: No results found for this or any previous visit (from the past 48 hour(s)).  Blood Alcohol level:  Lab Results  Component Value Date   ETH <5 09/01/2017   ETH <5 07/16/2017   Metabolic Disorder Labs: Lab Results  Component Value Date   HGBA1C 4.9 07/19/2017   MPG 94 07/19/2017   MPG 100 11/06/2016   Lab Results  Component Value Date   PROLACTIN 21.5 11/06/2016   PROLACTIN 49.4 (H) 08/13/2016   Lab Results  Component Value Date   CHOL 136 07/19/2017   TRIG 74 07/19/2017   HDL 67 07/19/2017   CHOLHDL 2.0 07/19/2017   VLDL 15 07/19/2017   LDLCALC 54 07/19/2017   LDLCALC 33 11/06/2016   Physical Findings: AIMS: Facial and Oral Movements Muscles of Facial  Expression: None, normal Lips and Perioral Area: None, normal Jaw: None, normal Tongue: None, normal,Extremity Movements Upper (arms, wrists, hands, fingers): None, normal Lower (legs, knees, ankles, toes): None, normal, Trunk Movements Neck, shoulders, hips: None, normal, Overall Severity Severity of abnormal movements (highest score from questions above): None, normal Incapacitation due to abnormal movements: None, normal Patient's awareness of abnormal movements (rate only patient's report): No Awareness, Dental Status Current problems with teeth and/or dentures?: No Does patient usually wear dentures?: No  CIWA:  CIWA-Ar Total: 1 COWS:  COWS Total Score: 1  Musculoskeletal: Strength & Muscle  Tone: within normal limits Gait & Station: normal Patient leans: N/A  Psychiatric Specialty Exam: Physical Exam: Nurses notes & Vital signs reviewed.  ROS  Blood pressure 99/60, pulse 70, temperature 98.1 F (36.7 C), temperature source Oral, resp. rate 16, height  (1.676 m), weight 54.9 kg (121 lb).Body mass index is 19.53 kg/m.  General Appearance: Casual  Eye Contact:  Good  Speech:  Normal Rate  Volume:  Normal  Mood:  Anxious and Depressed  Affect:  Appropriate  Thought Process:  Goal Directed and Descriptions of Associations: Intact  Orientation:  Full (Time, Place, and Person)  Thought Content:  Delusions, Hallucinations: Auditory and Paranoid Ideation  Suicidal Thoughts: reports fleeting thoughts, denies any plans or intent.  Homicidal Thoughts:  No  Memory:  Immediate;   Fair Recent;   Fair Remote;   Fair  Judgement:  Impaired  Insight:  Fair  Psychomotor Activity:  Normal  Concentration:  Concentration: Fair and Attention Span: Fair  Recall:  [employer] of Knowledge:  Fair  Language:  Fair  Akathisia:  No  Handed:  Right  AIMS (if indicated):     Assets:  Communication Skills Desire for Improvement  ADL's:  Intact  Cognition:  WNL  Sleep:  Number of Hours:  6.5     Treatment Plan Summary: Daily contact with patient to assess and evaluate symptoms and progress in treatment:  Will continue today 09/03/17 plan as below except where it is noted. -Continue Abilify 5 mg daily for mood control. -Continue Abilify ER 400 mg IM Q 30 days (1st dose on 09-02-17): For mood control. -Continue Wellbutrin 75 mg daily for depression. -Gabapentin 100 mg tid for agitation. -Continue Hydroxyzine tid prn for anxiety. -Continue Trazodone 100 mg Q hs prn for insomnia. -Continue routine prns for psychosis, itching. -Initiate Cogentin 0.5 mg once, then 0.5 mg daily prn for EPS. - Continue 15 minutes observation for safety concerns - Encouraged to participate in milieu therapy and group therapy counseling sessions and also work with coping skills -  Develop treatment plan to decrease risk of relapse upon discharge and to reduce the need for readmission. -  Psycho-social education regarding relapse prevention and self care. - Health care follow up as needed for medical problems. - Restart home medications where appropriate.  Sanjuana Kava, NP, PMHNP, FNP-BC. 09/03/2017, 3:46 PM   Agree with NP Progress Note

## 2017-09-04 DIAGNOSIS — F29 Unspecified psychosis not due to a substance or known physiological condition: Secondary | ICD-10-CM

## 2017-09-04 DIAGNOSIS — R440 Auditory hallucinations: Secondary | ICD-10-CM

## 2017-09-04 MED ORDER — ARIPIPRAZOLE 15 MG PO TABS
7.5000 mg | ORAL_TABLET | Freq: Every day | ORAL | Status: DC
  Administered 2017-09-04 – 2017-09-06 (×3): 7.5 mg via ORAL
  Filled 2017-09-04 (×5): qty 1

## 2017-09-04 MED ORDER — BENZTROPINE MESYLATE 0.5 MG PO TABS
0.5000 mg | ORAL_TABLET | Freq: Every day | ORAL | Status: DC | PRN
  Administered 2017-09-06: 0.5 mg via ORAL
  Filled 2017-09-04 (×2): qty 1

## 2017-09-04 NOTE — Progress Notes (Signed)
D: Pt observed pacing on the hallway. Pt at the time of assessment endorsed moderate, generalized body pain and AVH; states, "They keep say they have some power over me."-denies command hallucinations. Pt denied SI, HI or depression.  A: Medications offered as prescribed. Pt offered the opportunity to ask questions and state concerns. Support, encouragement, and safe environment provided. Will continue to monitor for any changes. 15-minute safety checks continue. R: Pt was med compliant. All patient's questions and concerns addressed. Pt attended wrap-up group. Safety checks continue.

## 2017-09-04 NOTE — [facility] Group Notes (Signed)
LCSW Group Therapy Note  09/04/2017 1:15pm  Type of Therapy and Topic:  Group Therapy:  Feelings around Relapse and Recovery  Participation Level:  Minimal   Description of Group:    Patients in this group will discuss emotions they experience before and after a relapse. They will process how experiencing these feelings, or avoidance of experiencing them, relates to having a relapse. Facilitator will guide patients to explore emotions they have related to recovery. Patients will be encouraged to process which emotions are more powerful. They will be guided to discuss the emotional reaction significant others in their lives may have to their relapse or recovery. Patients will be assisted in exploring ways to respond to the emotions of others without this contributing to a relapse.  Therapeutic Goals: 1. Patient will identify two or more emotions that lead to a relapse for them 2. Patient will identify two emotions that result when they relapse 3. Patient will identify two emotions related to recovery 4. Patient will demonstrate ability to communicate their needs through discussion and/or role plays   Summary of Patient Progress:  Present intially.  Identifed drugs as a behavior she wishes to change, and a trigger as money in her pocket.  She then left and did not return.   Therapeutic Modalities:   Cognitive Behavioral Therapy Solution-Focused Therapy Assertiveness Training Relapse Prevention Therapy   Ida RogueRodney B Devean Skoczylas, LCSW 09/04/2017 3:04 PM

## 2017-09-04 NOTE — Progress Notes (Signed)
[facility]  09/04/2017 12:34 PM Bridget Carpenter Carpenter  MRN:  [id number]  Subjective: patient states she is feeling better, and states she is looking forward to discharging soon. At this time denies medication side effects. Denies suicidal ideations.   Objective: Patient seen and case discussed in rounds with treatment team. Patient is a 26 year old female, who has a history of mood disorder, polysubstance abuse . Presented due to suicidal ideations, hallucinations. Admission UDS positive for Barbiturates, Cocaine and Cannabis, admission BAL < 5. Report from staff is that patient is improving gradually compared to admission, and seems more organized since Abilify IM injection. However,  that patient has been unable to stay throughout  groups, leaving early  and participation in milieu remains limited. She has also been noted to be singing at times . She reports auditory hallucinations, which " cuss sometimes , but stop when people come to talk to me". At this time does not appear internally preoccupied. She reports motivation in sobriety. She is currently on Abilify, and received long acting Abilify SRER IM on 9/5. She is also on Wellbutrin and on Neurontin. Denies medication side effects. Denies suicidal ideations .  Principal Problem: Bipolar disorder, curr episode mixed, severe, with psychotic features (HCC)  Diagnosis:   Patient Active Problem List   Diagnosis Date Noted  . PTSD (post-traumatic stress disorder) [F43.10] 09/02/2017  . Bipolar disorder, curr episode mixed, severe, with psychotic features (HCC) [F31.64] 09/02/2017  . 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 Overdose [T50.901A] 07/16/2017  . Aspiration pneumonia (HCC) [J69.0] 07/16/2017  . Acute respiratory failure (HCC) [J96.00] 07/16/2017  . Polysubstance abuse [F19.10] 07/16/2017  . Eczema [L30.9] 05/08/2017  . Borderline personality disorder [F60.3] 11/06/2016  . Cocaine use disorder, severe, dependence (HCC)  [F14.20] 11/06/2016  . Cannabis use disorder, moderate, dependence (HCC) [F12.20] 11/06/2016  . Alcohol use disorder, mild, abuse [F10.10] 11/06/2016  . Asthma [J45.909] 09/23/2011  . Tobacco use disorder [F17.200] 09/15/2009   Total Time spent with patient: 20 minutes  Past Psychiatric History: Bipolar disorder, Polysubstance use disorder.  Past Medical History:  Past Medical History:  Diagnosis Date  . Asthma   . Depression 2009   Inpatient psych admission for SI, dissociative fugue  . Dissociative disorder or reaction 2009  . Eczema   . H/O: suicide attempt   . ODD (oppositional defiant disorder)   . PTSD (post-traumatic stress disorder)   . Schizoaffective disorder (HCC)   . Substance abuse     Past Surgical History:  Procedure Laterality Date  . ADENOIDECTOMY    . TONSILLECTOMY     Family History:  Family History  Problem Relation Age of Onset  . Adopted: Yes  . Mental illness Mother   . Mental illness Father    Family Psychiatric  History: See H&P.  Social History:  History  Alcohol Use  . Yes    Comment: sometimes     History  Drug Use No    Social History   Social History  . Marital status: Single    Spouse name: N/A  . Number of children: N/A  . Years of education: N/A   Social History Main Topics  . Smoking status: Former Smoker    Packs/day: 0.50    Years: 2.00    Types: Cigarettes  . Smokeless tobacco: Never Used  . Alcohol use Yes     Comment: sometimes  . Drug use: No  . Sexual activity: Yes   Other Topics Concern  .  None   Social History Narrative  . None   Additional Social History:   Sleep: Good  Appetite:  Good  Current Medications: Current Facility-Administered Medications  Medication Dose Route Frequency Provider Last Rate Last Dose  . acetaminophen (TYLENOL) tablet 650 mg  650 mg Oral Q6H PRN Okonkwo, Justina A, NP      . alum & mag hydroxide-simeth (MAALOX/MYLANTA) 200-200-20 MG/5ML suspension 30 mL  30 mL Oral  Q4H PRN Okonkwo, Justina A, NP      . ARIPiprazole (ABILIFY) tablet 5 mg  5 mg Oral QHS Eappen, Saramma, MD   5 mg at 09/03/17 2118  . ARIPiprazole ER SRER 400 mg  400 mg Intramuscular Q30 days Jomarie Longs, MD   400 mg at 09/02/17 1432  . buPROPion [facility]) tablet 75 mg  75 mg Oral q morning - 10a Jomarie Longs, MD   75 mg at 09/04/17 0924  . diphenhydrAMINE (BENADRYL) capsule 25 mg  25 mg Oral Q6H PRN Rankin, Shuvon B, NP   25 mg at 09/02/17 1829  . gabapentin (NEURONTIN) capsule 100 mg  100 mg Oral [id number] Eappen, Saramma, MD   100 mg at 09/04/17 0924  . hydrOXYzine (ATARAX/VISTARIL) tablet 25 mg  25 mg Oral TID PRN Ferne Reus A, NP   25 mg at 09/03/17 1611  . magnesium hydroxide (MILK OF MAGNESIA) suspension 30 mL  30 mL Oral Daily PRN Okonkwo, Justina A, NP      . OLANZapine zydis (ZYPREXA) disintegrating tablet 5 mg  5 mg Oral TID PRN Jomarie Longs, MD   5 mg at 09/03/17 2045   Or  . OLANZapine (ZYPREXA) injection 5 mg  5 mg Intramuscular TID PRN Jomarie Longs, MD      . traZODone (DESYREL) tablet 100 mg  100 mg Oral QHS PRN Donell Sievert E, PA-C   100 mg at 09/03/17 2118   Lab Results: No results found for this or any previous visit (from the past 48 hour(s)).  Blood Alcohol level:  Lab Results  Component Value Date   ETH <5 09/01/2017   ETH <5 07/16/2017   Metabolic Disorder Labs: Lab Results  Component Value Date   HGBA1C 4.9 07/19/2017   MPG 94 07/19/2017   MPG 100 11/06/2016   Lab Results  Component Value Date   PROLACTIN 21.5 11/06/2016   PROLACTIN 49.4 (H) 08/13/2016   Lab Results  Component Value Date   CHOL 136 07/19/2017   TRIG 74 07/19/2017   HDL 67 07/19/2017   CHOLHDL 2.0 07/19/2017   VLDL 15 07/19/2017   LDLCALC 54 07/19/2017   LDLCALC 33 11/06/2016   Physical Findings: AIMS: Facial and Oral Movements Muscles of Facial Expression: None, normal Lips and Perioral Area: None, normal Jaw: None, normal Tongue: None,  normal,Extremity Movements Upper (arms, wrists, hands, fingers): None, normal Lower (legs, knees, ankles, toes): None, normal, Trunk Movements Neck, shoulders, hips: None, normal, Overall Severity Severity of abnormal movements (highest score from questions above): None, normal Incapacitation due to abnormal movements: None, normal Patient's awareness of abnormal movements (rate only patient's report): No Awareness, Dental Status Current problems with teeth and/or dentures?: No Does patient usually wear dentures?: No  CIWA:  CIWA-Ar Total: 1 COWS:  COWS Total Score: 1  Musculoskeletal: Strength & Muscle Tone: within normal limits Gait & Station: normal Patient leans: N/A  Psychiatric Specialty Exam: Physical Exam: Nurses notes & Vital signs reviewed.  ROS denies headache, denies chest pain, denies shortness of breath  Blood pressure Marland Kitchen)  116/52, pulse 74, temperature 98.6 F (37 C), temperature source Oral, resp. rate 16, height  (1.676 m), weight 54.9 kg (121 lb).Body mass index is 19.53 kg/m.  General Appearance: fairly groomed  Eye Contact:  Good  Speech:  Normal Rate  Volume:  Normal  Mood:  minimizes depression, states she feels better   Affect:  Smiles at times, reactive   Thought Process: goal directed, presents tangential at times   Orientation:  fully alert and attentive   Thought Content: reports auditory hallucinations, denies command hallucinations, no delusions expressed at this time   Suicidal Thoughts: currently denies suicidal ideations, also denies violent or homicidal ideations.  Homicidal Thoughts:  No  Memory: recent and remote grossly intact   Judgement:  fair  Insight:  Fair  Psychomotor Activity:  Normal  Concentration:  Concentration: Fair and Attention Span: Fair  Recall: good   Fund of Knowledge: good  Language:  good   Akathisia:  No  Handed:  Right  AIMS (if indicated):     Assets:  Communication Skills Desire for Improvement  ADL's:   Intact  Cognition:  WNL  Sleep:  Number of Hours: 6.5      Assessment - patient reports feeling better, and at this time minimizes depression, denies suicidal ideations . She remains somewhat disorganized, but report is that she is significantly improved following Abilify IM . She remains on PO Abilify as well, and denies side effects. She reports ongoing auditory hallucinations.   Treatment Plan Summary: Daily contact with patient to assess and evaluate symptoms and progress in treatment: Treatment plan reviewed today 9/7 as below Encourage group and milieu participation to work on coping skills and symptom reduction Continue to encourage efforts to work on relapse prevention and sobriety Treatment team working on disposition planning options  -Increase Abilify to 7.5  mg daily for psychosis and mood disorder  -Continue Abilify ER 400 mg IM Q 30 days (1st dose on 09-02-17) for psychosis and mood disorder  -Continue Wellbutrin 75 mg daily for depression. -Continue Gabapentin 100 mg tid for agitation. -Continue Hydroxyzine tid prn for anxiety. -Continue Trazodone 100 mg qhs  prn for insomnia. -D/C Cogentin as no EPS and not likely on Abilify , and to minimize potential benztropine anticholinergic side effects.   Craige Cotta, MD  09/04/2017, 12:34 PM   Patient ID: Bridget Carpenter, female   DOB: [birth year], 26 y.o.   MRN: [id number]

## 2017-09-04 NOTE — Plan of Care (Signed)
Problem: Education: Goal: Knowledge of the prescribed therapeutic regimen will improve Outcome: Progressing Patient appeared to be knowledgeable about medications this evening and was able to verbalize to writer what she was taking and why. Patient verbalizes understanding of scheduled and PRN medications.

## 2017-09-04 NOTE — Progress Notes (Signed)
Recreation Therapy Notes  Date: 09/04/17 Time: 1000 Location: 500 Hall Dayroom  Group Topic: Communication, Team Building, Problem Solving  Goal Area(s) Addresses:  Patient will effectively work with peers towards shared goal.  Patient will identify skills used to make activity successful.  Patient will identify how skills used during activity can be used to reach post d/c goals.   Behavioral Response: Engaged  Intervention: STEM Activity  Activity: Straw Bridge.  Patients were divided into groups of four depending on the size of the group.  Each group was given 20 straws and 24 inches of masking tape.  Patients were instructed to build a free standing bridge using all of the supplies they were given.  Once finished, the bridge should be able to hold a small hard cover book or small puzzle set.  Education: Pharmacist, communityocial Skills, Discharge Planning   Education Outcome: Acknowledges education/In group clarification offered/Needs additional education.   Clinical Observations/Feedback: Pt expressed the group had to try different things because it didn't work the first time.  Pt also stated "if you use the bridge as an analogy, you have to be stable when dealing with things".   Caroll RancherMarjette Dalene Robards, LRT/CTRS         Lillia AbedLindsay, Karmelo Bass A 09/04/2017 11:30 AM

## 2017-09-04 NOTE — Progress Notes (Signed)
Nursing Progress Note 1900-0730  D) Patient presents pleasant and cooperative but appears delusional. Patient states to writer, "I am really a boy even thought I have girl parts. I am worried because no one here knows I am a boy". Patient states, "I have a female spirit inside me named [place]. He is dominant". Patient also states to writer, "I just want you to know, the spirit inside me, [place], is in love with you". Patient takes her medications without incident this evening and requests both Zyprexa and Trazodone this evening. Patient was observed up in the milieu and attended group. Patient was provided snacks and took a shower this evening. Patient currently denies SI/HI/AVH or pain and contracts for safety on the unit.  A) Emotional support given. 1:1 interaction and active listening provided. Patient medicated as prescribed. Medications and plan of care reviewed with patient. Patient verbalized understanding without further questions. Snacks and fluids provided. Opportunities for questions or concerns presented to patient. Patient encouraged to continue to work on treatment goals. Labs, vital signs and patient behavior monitored throughout shift. Patient safety maintained with q15 min safety checks. Low fall risk precautions in place and reviewed with patient; patient verbalized understanding.  R) Patient receptive to interaction with nurse. Patient remains safe on the unit at this time. Patient denies any adverse medication reactions at this time. Patient is resting in bed without complaints. Will continue to monitor.

## 2017-09-04 NOTE — Tx Team (Signed)
Interdisciplinary Treatment and Diagnostic Plan Update  09/04/2017 Time of Session: 9:30am Bridget Carpenter MRN: [id number]  Principal Diagnosis: Bipolar disorder, curr episode mixed, severe, with psychotic features ([place])  Secondary Diagnoses: Principal Problem:   Bipolar disorder, curr episode mixed, severe, with psychotic features ([place]) Active Problems:   Borderline personality disorder   Cocaine use disorder, severe, dependence ([place])   Cannabis use disorder, moderate, dependence ([place])   PTSD (post-traumatic stress disorder)   Current Medications:  Current Facility-Administered Medications  Medication Dose Route Frequency Provider Last Rate Last Dose  . acetaminophen (TYLENOL) tablet 650 mg  650 mg Oral Q6H PRN Okonkwo, Justina A, NP      . alum & mag hydroxide-simeth (MAALOX/MYLANTA) 200-200-20 MG/5ML suspension 30 mL  30 mL Oral Q4H PRN Okonkwo, Justina A, NP      . ARIPiprazole (ABILIFY) tablet 5 mg  5 mg Oral QHS Eappen, Saramma, MD   5 mg at 09/03/17 2118  . ARIPiprazole ER SRER 400 mg  400 mg Intramuscular Q30 days Ursula Alert, MD   400 mg at 09/02/17 1432  . buPROPion [facility]) tablet 75 mg  75 mg Oral q morning - 10a Ursula Alert, MD   75 mg at 09/04/17 0924  . diphenhydrAMINE (BENADRYL) capsule 25 mg  25 mg Oral Q6H PRN Rankin, Shuvon B, NP   25 mg at 09/02/17 1829  . gabapentin (NEURONTIN) capsule 100 mg  100 mg Oral [id number] Eappen, Saramma, MD   100 mg at 09/04/17 0924  . hydrOXYzine (ATARAX/VISTARIL) tablet 25 mg  25 mg Oral TID PRN Hughie Closs A, NP   25 mg at 09/03/17 1611  . magnesium hydroxide (MILK OF MAGNESIA) suspension 30 mL  30 mL Oral Daily PRN Okonkwo, Justina A, NP      . OLANZapine zydis (ZYPREXA) disintegrating tablet 5 mg  5 mg Oral TID PRN Ursula Alert, MD   5 mg at 09/03/17 2045   Or  . OLANZapine (ZYPREXA) injection 5 mg  5 mg Intramuscular TID PRN Ursula Alert, MD      . traZODone (DESYREL) tablet 100 mg  100 mg Oral QHS PRN  Patriciaann Clan E, PA-C   100 mg at 09/03/17 2118    PTA Medications: Prescriptions Prior to Admission  Medication Sig Dispense Refill Last Dose  . albuterol (PROVENTIL HFA;VENTOLIN HFA) 108 (90 Base) MCG/ACT inhaler Inhale 1-2 puffs into the lungs every 4 (four) hours as needed for wheezing or shortness of breath. 1 Inhaler 0 prn at prn  . ARIPiprazole ER 400 MG SRER Inject 400 mg into the muscle every 28 (twenty-eight) days. Next injection on 08/17/2017 1 each 1   . clobetasol cream (TEMOVATE) 0.05 %   0   . clobetasol ointment (TEMOVATE) 2.59 % Apply 1 application topically 2 (two) times daily as needed. 30 g 1   . divalproex (DEPAKOTE ER) 500 MG 24 hr tablet Take 2 tablets (1,000 mg total) by mouth at bedtime. 60 tablet 1   . gabapentin (NEURONTIN) 100 MG capsule   0   . ivermectin (STROMECTOL) 3 MG TABS tablet take 4 tablets by mouth AT ONE TIME .  REPEAT IN 2 WEEKS  0   . mirtazapine (REMERON) 15 MG tablet Take 1 tablet (15 mg total) by mouth at bedtime. 30 tablet 1   . paliperidone (INVEGA) 6 MG 24 hr tablet Take 1 tablet (6 mg total) by mouth at bedtime. 21 tablet 0   . permethrin (ELIMITE) 5 % cream   0   .  RA TUSSIN [facility]/CHEST CONGEST DM 100-10 MG/5ML liquid take 5 milliliters by mouth every 6 hours if needed for cough  0   . triamcinolone cream (KENALOG) 0.1 % Apply topically 2 (two) times daily. 30 g 1     Treatment Modalities: Medication Management, Group therapy, Case management,  1 to 1 session with clinician, Psychoeducation, Recreational therapy.  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 Supportive family/friends  Physician Treatment Plan for Primary Diagnosis: Bipolar disorder, curr episode mixed, severe, with psychotic features ([place]) Long Term Goal(s): Improvement in symptoms so as ready for discharge  Short Term Goals: Ability to identify changes in lifestyle to reduce recurrence of condition will  improve Ability to disclose and discuss suicidal ideas Ability to demonstrate self-control will improve Compliance with prescribed medications will improve Ability to identify and develop effective coping behaviors will improve Compliance with prescribed medications will improve Ability to identify triggers associated with substance abuse/mental health issues will improve  Medication Management: Evaluate patient's response, side effects, and tolerance of medication regimen.  Therapeutic Interventions: 1 to 1 sessions, Unit Group sessions and Medication administration.  Evaluation of Outcomes: Not Met  Physician Treatment Plan for Secondary Diagnosis: Principal Problem:   Bipolar disorder, curr episode mixed, severe, with psychotic features ([place]) Active Problems:   Borderline personality disorder   Cocaine use disorder, severe, dependence ([place])   Cannabis use disorder, moderate, dependence ([place])   PTSD (post-traumatic stress disorder)   Long Term Goal(s): Improvement in symptoms so as ready for discharge  Short Term Goals: Ability to identify changes in lifestyle to reduce recurrence of condition will improve Ability to disclose and discuss suicidal ideas Ability to demonstrate self-control will improve Compliance with prescribed medications will improve Ability to identify and develop effective coping behaviors will improve Compliance with prescribed medications will improve Ability to identify triggers associated with substance abuse/mental health issues will improve  Medication Management: Evaluate patient's response, side effects, and tolerance of medication regimen.  Therapeutic Interventions: 1 to 1 sessions, Unit Group sessions and Medication administration.  Evaluation of Outcomes: Not Met   RN Treatment Plan for Primary Diagnosis: Bipolar disorder, curr episode mixed, severe, with psychotic features ([place]) Long Term Goal(s): Knowledge of disease and therapeutic regimen  to maintain health will improve  Short Term Goals: Ability to disclose and discuss suicidal ideas, Ability to identify and develop effective coping behaviors will improve and Compliance with prescribed medications will improve  Medication Management: RN will administer medications as ordered by provider, will assess and evaluate patient's response and provide education to patient for prescribed medication. RN will report any adverse and/or side effects to prescribing provider.  Therapeutic Interventions: 1 on 1 counseling sessions, Psychoeducation, Medication administration, Evaluate responses to treatment, Monitor vital signs and CBGs as ordered, Perform/monitor CIWA, COWS, AIMS and Fall Risk screenings as ordered, Perform wound care treatments as ordered.  Evaluation of Outcomes: Not Met   LCSW Treatment Plan for Primary Diagnosis: Bipolar disorder, curr episode mixed, severe, with psychotic features ([place]) Long Term Goal(s): Safe transition to appropriate next level of care at discharge, Engage patient in therapeutic group addressing interpersonal concerns.  Short Term Goals: Engage patient in aftercare planning with referrals and resources and Increase skills for wellness and recovery  Therapeutic Interventions: Assess for all discharge needs, 1 to 1 time with Social worker, Explore available resources and support systems, Assess for adequacy in community support network, Educate family and significant other(s) on suicide prevention, Complete Psychosocial Assessment, Interpersonal group  therapy.  Evaluation of Outcomes: Not Met   Progress in Treatment: Attending groups: Yes Participating in groups: Minmally  Taking medication as prescribed: Yes, MD continues to assess for medication changes as needed Toleration medication: Yes, no side effects reported at this time Family/Significant other contact made: yes with mother Patient understands diagnosis: Limited insight Discussing patient  identified problems/goals with staff: Yes Medical problems stabilized or resolved: Yes Denies suicidal/homicidal ideation: Yes Issues/concerns per patient self-inventory: None Other: N/A  New problem(s) identified: None identified at this time.   New Short Term/Long Term Goal(s): None identified at this time.   Discharge Plan or Barriers: Pt will return home and follow-up with outpatient services at [facility] in [place]  Reason for Continuation of Hospitalization: Anxiety Mania Paranoia Medication stabilization   Estimated Length of Stay: 3-5 days; est DC date 9/11  Attendees: Patient:  09/04/2017  10:41 AM  Physician: Dr. Parke Poisson, MD 09/04/2017  10:41 AM  Nursing: Doroteo Bradford , RN 09/04/2017  10:41 AM  RN Care Manager: Lars Pinks, RN 09/04/2017  10:41 AM  Social Worker: Adriana Reams, LCSW 09/04/2017  10:41 AM  Recreational Therapist:  09/04/2017  10:41 AM  Other: Lindell Spar, NP; Marvia Pickles, NP 09/04/2017  10:41 AM  Other:  09/04/2017  10:41 AM  Other: 09/04/2017  10:41 AM    Scribe for Treatment Team: Gladstone Lighter, LCSW 09/04/2017 10:41 AM

## 2017-09-04 NOTE — Progress Notes (Signed)
DAR NOTE: Patient presents with anxious affect and depressed mood. Pt has been heard singing loud in the room, at some point patient was labile but was easily redirected. Pt complain of itchiness due to eczema. Denies pain, auditory and visual hallucinations.  Rates depression at 4, hopelessness at 2, and anxiety at 6.  Maintained on routine safety checks.  Medications given as prescribed.  Support and encouragement offered as needed.  Attended group and participated. Patient observed socializing with peers in the dayroom.  Offered no complaint.

## 2017-09-05 MED ORDER — HYDROXYZINE HCL 50 MG PO TABS
50.0000 mg | ORAL_TABLET | Freq: Three times a day (TID) | ORAL | Status: DC | PRN
  Administered 2017-09-05 – 2017-09-06 (×3): 50 mg via ORAL
  Filled 2017-09-05 (×3): qty 1

## 2017-09-05 NOTE — Progress Notes (Signed)
Adult Psychoeducational Group Note  Date:  09/05/2017 Time:  9:29 PM  Group Topic/Focus:  Wrap-Up Group:   The focus of this group is to help patients review their daily goal of treatment and discuss progress on daily workbooks.  Participation Level:  Did Not Attend  Participation Quality:  Did not attend  Affect:  Did not attend  Cognitive:  Did not attend  Insight: None  Engagement in Group:  Did not attend  Modes of Intervention:  Did not attend  Additional Comments:  Pt did not attend evening wrap up group.  Felipa FurnaceChristopher  Docia Klar 09/05/2017, 9:29 PM

## 2017-09-05 NOTE — Progress Notes (Signed)
[facility]  09/05/2017 12:33 PM Bridget Carpenter  MRN:  [id number]   Subjective:  Patient reports " I am feeling okay today, I am scared to go home and I feel ready to discharge" - Patient is requesting to be " started on Lorazepam for her anxiety"  Objective: Bridget [place] Bridget Carpenter is awake, alert and oriented. Seen resting in dayroom. Reports she lives with her mother, however reports she doesn't like to take her medications  when she get home. Denies suicidal or homicidal ideation. Denies auditory or visual hallucination and does not appear to be responding to internal stimuli. Patient is hyperverbal and has increased anxiety during this assessment. Reports she is taken her medications and states is tolerating medications well. Reports good appetite and states she is resting well.  Support, encouragement and reassurance was provided.   Principal Problem: Bipolar disorder, curr episode mixed, severe, with psychotic features ([facility]) Diagnosis:   Patient Active Problem List   Diagnosis Date Noted  . PTSD (post-traumatic stress disorder) [F43.10] 09/02/2017  . Bipolar disorder, curr episode mixed, severe, with psychotic features ([facility]) [F31.64] 09/02/2017  . 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 Overdose [T50.901A] 07/16/2017  . Aspiration pneumonia ([facility]) [J69.0] 07/16/2017  . Acute respiratory failure ([facility]) [J96.00] 07/16/2017  . Polysubstance abuse [F19.10] 07/16/2017  . Eczema [L30.9] 05/08/2017  . Borderline personality disorder [F60.3] 11/06/2016  . Cocaine use disorder, severe, dependence ([facility]) [F14.20] 11/06/2016  . Cannabis use disorder, moderate, dependence ([facility]) [F12.20] 11/06/2016  . Alcohol use disorder, mild, abuse [F10.10] 11/06/2016  . Asthma [J45.909] 09/23/2011  . Tobacco use disorder [F17.200] 09/15/2009   Total Time spent with patient: 20 minutes  Past Psychiatric History:   Past Medical History:  Past Medical History:  Diagnosis Date  . Asthma   .  Depression 2009   Inpatient psych admission for SI, dissociative fugue  . Dissociative disorder or reaction 2009  . Eczema   . H/O: suicide attempt   . ODD (oppositional defiant disorder)   . PTSD (post-traumatic stress disorder)   . Schizoaffective disorder (HCC)   . Substance abuse     Past Surgical History:  Procedure Laterality Date  . ADENOIDECTOMY    . TONSILLECTOMY     Family History:  Family History  Problem Relation Age of Onset  . Adopted: Yes  . Mental illness Mother   . Mental illness Father    Family Psychiatric  History:  Social History:  History  Alcohol Use  . Yes    Comment: sometimes     History  Drug Use No    Social History   Social History  . Marital status: Single    Spouse name: N/A  . Number of children: N/A  . Years of education: N/A   Social History Main Topics  . Smoking status: Former Smoker    Packs/day: 0.50    Years: 2.00    Types: Cigarettes  . Smokeless tobacco: Never Used  . Alcohol use Yes     Comment: sometimes  . Drug use: No  . Sexual activity: Yes   Other Topics Concern  . None   Social History Narrative  . None   Additional Social History:                         Sleep: Fair  Appetite:  Fair  Current Medications: Current Facility-Administered Medications  Medication Dose Route Frequency Provider Last Rate Last Dose  . acetaminophen (TYLENOL) tablet  650 mg  650 mg Oral Q6H PRN Okonkwo, Justina A, NP      . alum & mag hydroxide-simeth (MAALOX/MYLANTA) 200-200-20 MG/5ML suspension 30 mL  30 mL Oral Q4H PRN Okonkwo, Justina A, NP      . ARIPiprazole (ABILIFY) tablet 7.5 mg  7.5 mg Oral QHS Cobos, Rockey Situ, MD   7.5 mg at 09/04/17 2119  . ARIPiprazole ER SRER 400 mg  400 mg Intramuscular Q30 days Jomarie Longs, MD   400 mg at 09/02/17 1432  . benztropine (COGENTIN) tablet 0.5 mg  0.5 mg Oral Daily PRN Armandina Stammer I, NP      . buPROPion [facility]) tablet 75 mg  75 mg Oral q morning - 10a Jomarie Longs, MD   75 mg at 09/05/17 0931  . gabapentin (NEURONTIN) capsule 100 mg  100 mg Oral [id number] Jomarie Longs, MD   100 mg at 09/05/17 0931  . hydrOXYzine (ATARAX/VISTARIL) tablet 50 mg  50 mg Oral TID PRN Oneta Rack, NP      . magnesium hydroxide (MILK OF MAGNESIA) suspension 30 mL  30 mL Oral Daily PRN Okonkwo, Justina A, NP      . OLANZapine zydis (ZYPREXA) disintegrating tablet 5 mg  5 mg Oral TID PRN Jomarie Longs, MD   5 mg at 09/04/17 2213   Or  . OLANZapine (ZYPREXA) injection 5 mg  5 mg Intramuscular TID PRN Jomarie Longs, MD      . traZODone (DESYREL) tablet 100 mg  100 mg Oral QHS PRN Donell Sievert E, PA-C   100 mg at 09/04/17 2213    Lab Results: No results found for this or any previous visit (from the past 48 hour(s)).  Blood Alcohol level:  Lab Results  Component Value Date   ETH <5 09/01/2017   ETH <5 07/16/2017    Metabolic Disorder Labs: Lab Results  Component Value Date   HGBA1C 4.9 07/19/2017   MPG 94 07/19/2017   MPG 100 11/06/2016   Lab Results  Component Value Date   PROLACTIN 21.5 11/06/2016   PROLACTIN 49.4 (H) 08/13/2016   Lab Results  Component Value Date   CHOL 136 07/19/2017   TRIG 74 07/19/2017   HDL 67 07/19/2017   CHOLHDL 2.0 07/19/2017   VLDL 15 07/19/2017   LDLCALC 54 07/19/2017   LDLCALC 33 11/06/2016    Physical Findings: AIMS: Facial and Oral Movements Muscles of Facial Expression: None, normal Lips and Perioral Area: None, normal Jaw: None, normal Tongue: None, normal,Extremity Movements Upper (arms, wrists, hands, fingers): None, normal Lower (legs, knees, ankles, toes): None, normal, Trunk Movements Neck, shoulders, hips: None, normal, Overall Severity Severity of abnormal movements (highest score from questions above): None, normal Incapacitation due to abnormal movements: None, normal Patient's awareness of abnormal movements (rate only patient's report): No Awareness, Dental Status Current problems with  teeth and/or dentures?: No Does patient usually wear dentures?: No  CIWA:  CIWA-Ar Total: 1 COWS:  COWS Total Score: 1  Musculoskeletal: Strength & Muscle Tone: within normal limits Gait & Station: normal Patient leans: N/A  Psychiatric Specialty Exam: Physical Exam  Nursing note and vitals reviewed. Constitutional: She is oriented to person, place, and time. She appears well-developed.  Cardiovascular: Normal rate and regular rhythm.   Neurological: She is alert and oriented to person, place, and time.  Psychiatric: She has a normal mood and affect. Her behavior is normal.    Review of Systems  Psychiatric/Behavioral: Positive for depression. The patient is nervous/anxious.  Blood pressure (!) 102/58, pulse 87, temperature 98.6 F (37 C), temperature source Oral, resp. rate 18, height  (1.676 m), weight 54.9 kg (121 lb).Body mass index is 19.53 kg/m.  General Appearance: Casual  Eye Contact:  Good  Speech:  Clear and Coherent and Pressured  Volume:  Normal  Mood:  Anxious and Dysphoric  Affect:  Congruent  Thought Process:  Coherent  Orientation:  Full (Time, Place, and Person)  Thought Content:  Hallucinations: None and Rumination  Suicidal Thoughts:  No  Homicidal Thoughts:  No  Memory:  Immediate;   Fair Recent;   Fair Remote;   Fair  Judgement:  Fair  Insight:  Present  Psychomotor Activity:  Normal  Concentration:  Concentration: Fair  Recall:  [employer] of Knowledge:  Fair  Language:  Good  Akathisia:  No  Handed:  Right  AIMS (if indicated):     Assets:  Communication Skills Desire for Improvement Resilience Social Support  ADL's:  Intact  Cognition:  WNL  Sleep:  Number of Hours: 6     I agree with current treatment plan on 09/05/2017, Patient seen face-to-face for psychiatric evaluation follow-up, chart reviewed.  Reviewed the information documented and agree with the treatment plan.  Treatment Plan Summary: Daily contact with patient to  assess and evaluate symptoms and progress in treatment and Medication management   Continue with Abilify 7.5mg , Neurontin 100 mg and Wellbutrin 75 mg for mood stabilization. Continue with Trazodone 100 mg for insomnia Increased Vistaril 25 mg to 50 mg PO TID PRN   Will continue to monitor vitals ,medication compliance and treatment side effects while patient is here.  CSW will start working on disposition.  Patient to participate in therapeutic milieu   Oneta Rack, NP 09/05/2017, 12:33 PM

## 2017-09-05 NOTE — Progress Notes (Signed)
DAR NOTE: Patient presents with anxious affect and mood.  Denies auditory and visual hallucinations.  Patient seen pacing the unit and is repeatedly asking staff for the same items. Patient heard singing very loud in her room and had to be redirected.  Described energy level as low and concentration as poor.  Reports withdrawal symptoms of cravings, agitation, runny nose, nausea, and irritability on self inventory form.  Rates depression at 10, hopelessness at 10, and anxiety at 10.  Maintained on routine safety checks.  Medications given as prescribed.  Support and encouragement offered as needed.  States goal for today is "to stay positive."  Patient received Vistaril 50 mg and Zyprexa 5 mg for severe anxiety and agitation with good effect.

## 2017-09-05 NOTE — Progress Notes (Signed)
Nursing Progress Note 1900-0730  D) Patient presents pleasant and cooperative but appears delusional. Patient was observed up in the milieu and attended group. Patient is observed up in her bathroom and asks staff for towels repeatedly after showering. Support provided and patient encouraged to rest and redirected away from bathroom. Patient was provided snacks this evening. Patient takes her medications without incident this evening and requests both Zyprexa and Trazodone this evening. Patient currently denies SI/HI or pain but reports hearing "a female and a female voice". Patient contracts for safety on the unit.  A) Emotional support given. 1:1 interaction and active listening provided. Patient medicated as prescribed. Medications and plan of care reviewed with patient. Patient verbalized understanding without further questions. Snacks and fluids provided. Opportunities for questions or concerns presented to patient. Patient encouraged to continue to work on treatment goals. Labs, vital signs and patient behavior monitored throughout shift. Patient safety maintained with q15 min safety checks. Low fall risk precautions in place and reviewed with patient; patient verbalized understanding.  R) Patient receptive to interaction with nurse. Patient remains safe on the unit at this time. Patient denies any adverse medication reactions at this time. Patient is resting in bed without complaints. Will continue to monitor.

## 2017-09-05 NOTE — [facility] Group Notes (Signed)
[facility] Group Notes: (Clinical Social Work)   09/05/2017      Type of Therapy:  Group Therapy   Participation Level:  Did Not Attend despite MHT prompting   Argie Applegate Grossman-Orr, LCSW 09/05/2017, 3:57 PM     

## 2017-09-05 NOTE — Plan of Care (Signed)
Problem: Health Behavior/Discharge Planning: Goal: Compliance with prescribed medication regimen will improve Outcome: Progressing Patient taking medications and attending groups per plan of care. Patient verbalizes understanding and is agreeable to current plan of care.   

## 2017-09-06 MED ORDER — TRIAMCINOLONE 0.1 % CREAM:EUCERIN CREAM 1:1
TOPICAL_CREAM | Freq: Two times a day (BID) | CUTANEOUS | Status: DC
  Administered 2017-09-06: 1 via TOPICAL
  Administered 2017-09-07: 10:00:00 via TOPICAL
  Filled 2017-09-06: qty 1

## 2017-09-06 NOTE — [facility] Group Notes (Signed)
[facility]  Date/Time:  09/06/2017  11:00AM-12:00PM  Type of Therapy and Topic:  Group Therapy:  Music and Mood  Participation Level:  Active   Description of Group: In this process group, members listened to a variety of genres of music and identified that different types of music evoke different responses.  Patients were encouraged to identify music that was soothing for them and music that was energizing for them.  Patients discussed how this knowledge can help with wellness and recovery in various ways including managing depression and anxiety as well as encouraging healthy sleep habits.    Therapeutic Goals: 1. Patients will explore the impact of different varieties of music on mood 2. Patients will verbalize the thoughts they have when listening to different types of music 3. Patients will identify music that is soothing to them as well as music that is energizing to them 4. Patients will discuss how to use this knowledge to assist in maintaining wellness and recovery 5. Patients will explore the use of music as a coping skill  Summary of Patient Progress:  Patient was not present at the beginning of group, but she interacted well and at the end of group stated she felt relaxed, wanted the names of instrumental songs played.  She stated she had learned through group that if she is at home and becomes anxious, instrumental music can help her calm down.  Therapeutic Modalities: Solution Focused Brief Therapy Motivational Interviewing Activity   Ambrose MantleMareida Grossman-Orr, LCSW 09/06/2017 8:38 AM

## 2017-09-06 NOTE — Progress Notes (Signed)
Pt has spent most of the evening in her room, taking a long shower and singing very loudly.  She came to the dayroom after a while to watch TV.  Writer asked her to come to the med window to get her medication.  She told Clinical research associatewriter that she had a breakout on her face and thinks it is the medications, so she said she did not want to take any meds tonight.  Writer spent time talking to pt about how important it is for her to take her meds, and how frequent washing could be the reason her face is dry and irritated.  Pt voiced understanding with Clinical research associatewriter and took her medications. Pt voiced no other concerns tonight.  Pt denies SI/HI/AVH.  Support and encouragement offered.  Discharge plans are in process.  Safety maintained with q15 minute checks.

## 2017-09-06 NOTE — [facility] Group Notes (Signed)
[facility] Group Notes:  (Nursing/MHT/Case Management/Adjunct)  Date:  09/06/2017  Time:  2:38 PM  Type of Therapy:  Psychoeducational Skills  Participation Level:  Did Not Attend  Participation Quality:  Did Not Attend  Affect:  Did Not Attend  Cognitive:  Did Not Attend  Insight:  None  Engagement in Group:  Did Not Attend  Modes of Intervention:  Did Not Attend  Summary of Progress/Problems: Pt did not attend patient self inventory group.   Bridget Carpenter 09/06/2017, 2:38 PM 

## 2017-09-06 NOTE — Progress Notes (Addendum)
Data. Patient denies SI/HI/AVH. Verbally contracts for safety on the unit and to come to staff before acting of any self harm thoughts/feelings/voices.  Patient interacting well with staff and other patients. At times patient is singing loudly in her room and at ties patient is reporting increasing anxiety. She is paranoid and has stated, "I don't know what I did wrong. What do I need to do right?" Affect anxious most of the shift. Patient did complain of stiffness in her neck and shoulders and received a cogentin, with positive effect. Action. Emotional support and encouragement offered. Education provided on medication, indications and side effect. Q 15 minute checks done for safety. Response. Safety on the unit maintained through 15 minute checks.  Medications taken as prescribed. Attended groups. Remained calm through out shift.

## 2017-09-06 NOTE — Progress Notes (Signed)
[facility] MD Progress Note  09/06/2017 1:23 PM Bridget Carpenter Marcy Sookdeo  MRN:  [id number]   Subjective:  Bridget Carpenter reports " I am feeling a lot better and I feel ready to leave."    Objective: Bridget Carpenter Shayan Gaiser seen resting in bedroom. Patient continues to ruminate with medications. States she needs more cogentin to help with muscle aches. 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  States she is still experiencing mild anxiety but her symptoms has improved a lot with the medication adjustment. Mostly regarding  her discharge and follow-up plan Continues to deny suicidal or homicidal ideation or intent. Denies auditory or visual hallucination and does not appear to be responding to internal stimuli.  Reports she is taken her medications denies any medications side effects. Support, encouragement and reassurance was provided.   Principal Problem: Bipolar disorder, curr episode mixed, severe, with psychotic features (HCC) Diagnosis:   Patient Active Problem List   Diagnosis Date Noted  . PTSD (post-traumatic stress disorder) [F43.10] 09/02/2017  . Bipolar disorder, curr episode mixed, severe, with psychotic features (HCC) [F31.64] 09/02/2017  . 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 Overdose [T50.901A] 07/16/2017  . Aspiration pneumonia (HCC) [J69.0] 07/16/2017  . Acute respiratory failure (HCC) [J96.00] 07/16/2017  . Polysubstance abuse [F19.10] 07/16/2017  . Eczema [L30.9] 05/08/2017  . Borderline personality disorder [F60.3] 11/06/2016  . Cocaine use disorder, severe, dependence (HCC) [F14.20] 11/06/2016  . Cannabis use disorder, moderate, dependence (HCC) [F12.20] 11/06/2016  . Alcohol use disorder, mild, abuse [F10.10] 11/06/2016  . Asthma [J45.909] 09/23/2011  . Tobacco use disorder [F17.200] 09/15/2009   Total Time spent with patient: 20 minutes  Past Psychiatric History:   Past Medical History:  Past Medical  History:  Diagnosis Date  . Asthma   . Depression 2009   Inpatient psych admission for SI, dissociative fugue  . Dissociative disorder or reaction 2009  . Eczema   . H/O: suicide attempt   . ODD (oppositional defiant disorder)   . PTSD (post-traumatic stress disorder)   . Schizoaffective disorder (HCC)   . Substance abuse     Past Surgical History:  Procedure Laterality Date  . ADENOIDECTOMY    . TONSILLECTOMY     Family History:  Family History  Problem Relation Age of Onset  . Adopted: Yes  . Mental illness Mother   . Mental illness Father    Family Psychiatric  History:  Social History:  History  Alcohol Use  . Yes    Comment: sometimes     History  Drug Use No    Social History   Social History  . Marital status: Single    Spouse name: N/A  . Number of children: N/A  . Years of education: N/A   Social History Main Topics  . Smoking status: Former Smoker    Packs/day: 0.50    Years: 2.00    Types: Cigarettes  . Smokeless tobacco: Never Used  . Alcohol use Yes     Comment: sometimes  . Drug use: No  . Sexual activity: Yes   Other Topics Concern  . None   Social History Narrative  . None   Additional Social History:                         Sleep: Fair  Appetite:  Fair  Current Medications: Current Facility-Administered Medications  Medication Dose Route Frequency Provider Last Rate Last  Dose  . acetaminophen (TYLENOL) tablet 650 mg  650 mg Oral Q6H PRN Okonkwo, Justina A, NP   650 mg at 09/06/17 0907  . alum & mag hydroxide-simeth (MAALOX/MYLANTA) 200-200-20 MG/5ML suspension 30 mL  30 mL Oral Q4H PRN Okonkwo, Justina A, NP      . ARIPiprazole (ABILIFY) tablet 7.5 mg  7.5 mg Oral QHS Cobos, Fernando A, MD   7.5 mg at 09/05/17 2101  . ARIPiprazole ER SRER 400 mg  400 mg Intramuscular Q30 days Jomarie Longs, MD   400 mg at 09/02/17 1432  . benztropine (COGENTIN) tablet 0.5 mg  0.5 mg Oral Daily PRN Armandina Stammer I, NP      .  buPROPion [facility]) tablet 75 mg  75 mg Oral q morning - 10a Jomarie Longs, MD   75 mg at 09/06/17 0905  . gabapentin (NEURONTIN) capsule 100 mg  100 mg Oral [id number] Eappen, Levin Bacon, MD   100 mg at 09/06/17 0907  . hydrOXYzine (ATARAX/VISTARIL) tablet 50 mg  50 mg Oral TID PRN Oneta Rack, NP   50 mg at 09/06/17 0907  . magnesium hydroxide (MILK OF MAGNESIA) suspension 30 mL  30 mL Oral Daily PRN Okonkwo, Justina A, NP      . OLANZapine zydis (ZYPREXA) disintegrating tablet 5 mg  5 mg Oral TID PRN Jomarie Longs, MD   5 mg at 09/06/17 1201   Or  . OLANZapine (ZYPREXA) injection 5 mg  5 mg Intramuscular TID PRN Jomarie Longs, MD      . traZODone (DESYREL) tablet 100 mg  100 mg Oral QHS PRN Donell Sievert E, PA-C   100 mg at 09/05/17 2327    Lab Results: No results found for this or any previous visit (from the past 48 hour(s)).  Blood Alcohol level:  Lab Results  Component Value Date   ETH <5 09/01/2017   ETH <5 07/16/2017    Metabolic Disorder Labs: Lab Results  Component Value Date   HGBA1C 4.9 07/19/2017   MPG 94 07/19/2017   MPG 100 11/06/2016   Lab Results  Component Value Date   PROLACTIN 21.5 11/06/2016   PROLACTIN 49.4 (H) 08/13/2016   Lab Results  Component Value Date   CHOL 136 07/19/2017   TRIG 74 07/19/2017   HDL 67 07/19/2017   CHOLHDL 2.0 07/19/2017   VLDL 15 07/19/2017   LDLCALC 54 07/19/2017   LDLCALC 33 11/06/2016    Physical Findings: AIMS: Facial and Oral Movements Muscles of Facial Expression: None, normal Lips and Perioral Area: None, normal Jaw: None, normal Tongue: None, normal,Extremity Movements Upper (arms, wrists, hands, fingers): None, normal Lower (legs, knees, ankles, toes): None, normal, Trunk Movements Neck, shoulders, hips: None, normal, Overall Severity Severity of abnormal movements (highest score from questions above): None, normal Incapacitation due to abnormal movements: None, normal Patient's awareness of  abnormal movements (rate only patient's report): No Awareness, Dental Status Current problems with teeth and/or dentures?: No Does patient usually wear dentures?: No  CIWA:  CIWA-Ar Total: 1 COWS:  COWS Total Score: 1  Musculoskeletal: Strength & Muscle Tone: within normal limits Gait & Station: normal Patient leans: N/A  Psychiatric Specialty Exam: Physical Exam  Nursing note and vitals reviewed. Constitutional: She is oriented to person, place, and time. She appears well-developed.  Cardiovascular: Normal rate and regular rhythm.   Neurological: She is alert and oriented to person, place, and time.  Skin: Skin is warm and dry.  Psychiatric: She has a normal mood and affect.  Her behavior is normal.    Review of Systems  Psychiatric/Behavioral: Positive for depression. The patient is nervous/anxious.     Blood pressure (!) 102/58, pulse 87, temperature 98.6 F (37 C), temperature source Oral, resp. rate 18, height 5\' 6"  (1.676 m), weight 54.9 kg (121 lb).Body mass index is 19.53 kg/m.  General Appearance: Casual  Eye Contact:  Good  Speech:  Clear and Coherent and Pressured  Volume:  Normal  Mood:  Anxious and Dysphoric  Affect:  Congruent  Thought Process:  Coherent  Orientation:  Full (Time, Place, and Person)  Thought Content:  Rumination  Suicidal Thoughts:  No  Homicidal Thoughts:  No  Memory:  Immediate;   Fair Recent;   Fair Remote;   Fair  Judgement:  Fair  Insight:  Present  Psychomotor Activity:  Normal  Concentration:  Concentration: Fair  Recall:  [employer]  Fund of Knowledge:  Fair  Language:  Good  Akathisia:  No  Handed:  Right  AIMS (if indicated):     Assets:  Communication Skills Desire for Improvement Resilience Social Support  ADL's:  Intact  Cognition:  WNL  Sleep:  Number of Hours: 5.5     I agree with current treatment plan on 09/06/2017, Patient seen face-to-face for psychiatric evaluation follow-up, chart reviewed.  Reviewed the  information documented and agree with the treatment plan.  Treatment Plan Summary: Daily contact with patient to assess and evaluate symptoms and progress in treatment and Medication management   Continue with Abilify 7.5mg , Neurontin 100 mg and Wellbutrin 75 mg for mood stabilization. Continue with Trazodone 100 mg for insomnia Continue Vistaril to 50 mg PO TID PRN   Will continue to monitor vitals ,medication compliance and treatment side effects while patient is here.  CSW will start working on disposition.  Patient to participate in therapeutic milieu   Oneta Rackanika N Lewis, NP 09/06/2017, 1:23 PM

## 2017-09-07 MED ORDER — ARIPIPRAZOLE ER 400 MG IM SRER: 400.0000 mg | INTRAMUSCULAR | 0 refills | Status: DC

## 2017-09-07 MED ORDER — BUPROPION HCL 75 MG PO TABS: 75.0000 mg | ORAL_TABLET | Freq: Every morning | ORAL | 0 refills | Status: DC

## 2017-09-07 MED ORDER — TRIAMCINOLONE 0.1 % CREAM:EUCERIN CREAM 1:1: 1.0000 "application " | TOPICAL_CREAM | Freq: Two times a day (BID) | CUTANEOUS | Status: DC

## 2017-09-07 MED ORDER — ARIPIPRAZOLE 15 MG PO TABS: 7.5000 mg | ORAL_TABLET | Freq: Every day | ORAL | 0 refills | Status: DC

## 2017-09-07 MED ORDER — BENZTROPINE MESYLATE 0.5 MG PO TABS: 0.5000 mg | ORAL_TABLET | Freq: Every day | ORAL | 0 refills | Status: DC | PRN

## 2017-09-07 MED ORDER — TRAZODONE HCL 100 MG PO TABS: 100.0000 mg | ORAL_TABLET | Freq: Every evening | ORAL | 0 refills | Status: DC | PRN

## 2017-09-07 MED ORDER — GABAPENTIN 100 MG PO CAPS: 100.0000 mg | ORAL_CAPSULE | ORAL | 0 refills | Status: DC

## 2017-09-07 MED ORDER — HYDROXYZINE HCL 50 MG PO TABS: 50.0000 mg | ORAL_TABLET | Freq: Three times a day (TID) | ORAL | 0 refills | Status: DC | PRN

## 2017-09-07 NOTE — [facility] Suicide Risk Assessment (Signed)
[facility]   Principal Problem: Bipolar disorder, curr episode mixed, severe, with psychotic features [facility](HCC) Discharge Diagnoses:  Patient Active Problem List   Diagnosis Date Noted  . PTSD (post-traumatic stress disorder) [F43.10] 09/02/2017  . Bipolar disorder, curr episode mixed, severe, with psychotic features (HCC) [F31.64] 09/02/2017  . 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 Overdose [T50.901A] 07/16/2017  . Aspiration pneumonia (HCC) [J69.0] 07/16/2017  . Acute respiratory failure (HCC) [J96.00] 07/16/2017  . Polysubstance abuse [F19.10] 07/16/2017  . Eczema [L30.9] 05/08/2017  . Borderline personality disorder [F60.3] 11/06/2016  . Cocaine use disorder, severe, dependence (HCC) [F14.20] 11/06/2016  . Cannabis use disorder, moderate, dependence (HCC) [F12.20] 11/06/2016  . Alcohol use disorder, mild, abuse [F10.10] 11/06/2016  . Asthma [J45.909] 09/23/2011  . Tobacco use disorder [F17.200] 09/15/2009    Total Time spent with patient: 30 minutes  Musculoskeletal: Strength & Muscle Tone: within normal limits Gait & Station: normal Patient leans: N/A  Psychiatric Specialty Exam: Review of Systems  Psychiatric/Behavioral: Negative for depression and suicidal ideas.  All other systems reviewed and are negative.   Blood pressure (!) 102/58, pulse 87, temperature 98.6 F (37 C), temperature source Oral, resp. rate 18, height 5\' 6"  (1.676 m), weight 54.9 kg (121 lb).Body mass index is 19.53 kg/m.  General Appearance: Casual  Eye Contact::  Fair  Speech:  Clear and Coherent409  Volume:  Normal  Mood:  Euthymic  Affect:  Appropriate  Thought Process:  Goal Directed and Descriptions of Associations: Intact  Orientation:  Full (Time, Place, and Person)  Thought Content:  Logical  Suicidal Thoughts:  No  Homicidal Thoughts:  No  Memory:  Immediate;   Fair Recent;   Fair Remote;   Fair  Judgement:  Fair  Insight:  Fair  Psychomotor Activity:   Normal  Concentration:  Fair  Recall:  [employer]Fair  Fund of Knowledge:Fair  Language: Fair  Akathisia:  No  Handed:  Right  AIMS (if indicated):     Assets:  Communication Skills Desire for Improvement  Sleep:  Number of Hours: 6.75  Cognition: WNL  ADL's:  Intact   Mental Status Per Nursing Assessment::   On Admission:  NA  Demographic Factors:  NA  Loss Factors: NA  Historical Factors: Impulsivity  Risk Reduction Factors:   Positive social support and Positive therapeutic relationship  Continued Clinical Symptoms:  Alcohol/Substance Abuse/Dependencies Previous Psychiatric Diagnoses and Treatments  Cognitive Features That Contribute To Risk:  None    Suicide Risk:  Minimal: No identifiable suicidal ideation.  Patients presenting with no risk factors but with morbid ruminations; may be classified as minimal risk based on the severity of the depressive symptoms  Follow-up Information    [facility] Follow up on 09/11/2017.   Why:  at 8:00am for your hospital discharge appointment.  Contact information: [address] [place] [address] [phone]           Plan Of Care/Follow-up recommendations:  Activity:  no restrictions Diet:  regular Tests:  Abilify Maintenna IM 400 mg q 30 days - last dose 09/02/2017 Other:  none  Ranbir Chew, MD 09/07/2017, 10:03 AM

## 2017-09-07 NOTE — Progress Notes (Signed)
Patient ID: Bridget Carpenter, female   DOB: 07/18/1991, 26 y.o.   MRN: [id number] Patient educated on her discharge plan and verbalizes understanding.  Pt left hospital with all of her discharge instructions, all personal belongings, and prescription scripts.  Pt taken to hospital entrance where she met her mother who provided her with transportation home.

## 2017-09-07 NOTE — Discharge Summary (Signed)
Physician Discharge Summary Note  Patient:  Bridget Carpenter Kimiah Hibner is an 26 y.o., female MRN:  [id number] DOB:  19-Jul-1991 Patient phone:  [phone] (home)  Patient address:   [address] [place] [address],   Total Time spent with patient: Greater than 30 minutes  Date of Admission:  09/01/2017 Date of Discharge: 09/07/2017  Reason for Admission: Worsening symptoms of Bipolar disorder.  Past Psychiatric History: The patient has a history of multiple prior inpatient psychiatric hospitalizations due to suicidality in the context of polysubstance abuse and psychosis. She has been hospitalized over 20 times and was just released from [facility]. She has had multiple suicide attempts including trying to jump in front of cars, jumping out of cars. She also states she has overdosed on medications in the past with the attempt to kill herself. The patient reports history of self injury by cutting.As far as diagnosis says that she has been diagnosed in the past with schizophrenia, and bipolar. Per the chart that she has been given diagnosis of ADHD and oppositional defiant disorder as a child along with issues related to substance abuse. The patient is supposed to be followed at [facility] but is unclear whether not she is compliant.  Substance Abuse History: Toxicology screen was positive for methamphetamines, cocaine and marijuana. The patient has a long history of polysubstance abuse. She denies any heavy alcohol use. The patient says she quit smoking several months ago but prior to that smoked on and off since her mid teens.   Principal Problem: Bipolar disorder, curr episode mixed, severe, with psychotic features [facility])  Discharge Diagnoses: Patient Active Problem List   Diagnosis Date Noted  . PTSD (post-traumatic stress disorder) [F43.10] 09/02/2017  . Bipolar disorder, curr episode mixed, severe, with psychotic features (HCC) [F31.64] 09/02/2017  . 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 Overdose  [T50.901A] 07/16/2017  . Aspiration pneumonia (HCC) [J69.0] 07/16/2017  . Acute respiratory failure (HCC) [J96.00] 07/16/2017  . Polysubstance abuse [F19.10] 07/16/2017  . Eczema [L30.9] 05/08/2017  . Borderline personality disorder [F60.3] 11/06/2016  . Cocaine use disorder, severe, dependence (HCC) [F14.20] 11/06/2016  . Cannabis use disorder, moderate, dependence (HCC) [F12.20] 11/06/2016  . Alcohol use disorder, mild, abuse [F10.10] 11/06/2016  . Asthma [J45.909] 09/23/2011  . Tobacco use disorder [F17.200] 09/15/2009   Past Medical History:  Past Medical History:  Diagnosis Date  . Asthma   . Depression 2009   Inpatient psych admission for SI, dissociative fugue  . Dissociative disorder or reaction 2009  . Eczema   . H/O: suicide attempt   . ODD (oppositional defiant disorder)   . PTSD (post-traumatic stress disorder)   . Schizoaffective disorder (HCC)   . Substance abuse     Past Surgical History:  Procedure Laterality Date  . ADENOIDECTOMY    . TONSILLECTOMY     Family History: Her biological mother has a history of a unknown type of mental illness.  Family History  Problem Relation Age of Onset  . Adopted: Yes  . Mental illness Mother   . Mental illness Father    Social History: The patient was adopted and has been raised by her adopted mother. She has one 28-year-old daughter. The patient has a 10th grade education. She is currently unemployed and on disability. 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 She is dating someone and has a boyfriend that has never been married. She lives with her adoptive mother and her 40-year-old daughter. She has Medicaid. The patient says she has  been arrested multiple times in the past for drug charges as well as larceny. She denies any current pending charges. History  Alcohol Use  . Yes    Comment: sometimes     History  Drug Use No    Social History   Social History  . Marital status: Single    Spouse name: N/A  .  Number of children: N/A  . Years of education: N/A   Social History Main Topics  . Smoking status: Former Smoker    Packs/day: 0.50    Years: 2.00    Types: Cigarettes  . Smokeless tobacco: Never Used  . Alcohol use Yes     Comment: sometimes  . Drug use: No  . Sexual activity: Yes   Other Topics Concern  . None   Social History Narrative  . None   Hospital Course: This is one of several admission assessments for this 26 year old African-American female with hx of mental illness, chronic & Polysubstance use disorder. She has had numerous inpatient psychiatric admissions at the [facility], [facility] as well as [facility] health systems. Reports indicated that patient is non-compliant with going to her psychiatric follow-up appointments & does not take her mental health medications. She continues to abuse substances. During this assessment, Bridget Carpenter reports, "My mother took me to the [facility] ED yesterday. She used this word, I'm fed-up for not being happy prior to taking me to the hospital. I, myself is not happy listening to her. That is why I came to this hospital to live for free. It is like a hotel room. I have been to the [facility], [facility] & [facility]. Their rooms all look alike. I have been to every hospital in [facility] because, I'm voluntary. I need to help myself. I have in the past used substances. I used cocaine, smoked weed 3 days ago. I was on medication about a week ago. I can't say much to you because, I don't want you to give me those medicines that will make me hear voices. I don't trust people".   Bridget Carpenter was admitted to the [facility] adult unit for worsening symptoms of Bipolar disorder, most recent episode, depressed. Her admission reports indicated that she is non-compliant to her mental health treatment regimen. She has hx of substance abuse issues & have had numerous psychiatric admissions & treatments over the years. Bridget Carpenter required mood stabilization  treatment.   After evaluation of her presenting symptoms, she was medicated & discharged on; Abilify 15 mg daily mood control, Abilify ER 400 mg IM Q 30 days for mood control, Cogentin 0.5 mg prn for EPS, Wellbutrin 75 mg for depression, Gabapentin 100 mg for agitation, Hydroxyzine 25 mg prn for anxiety & Trazodone 100 mg for insomnia. Her other pre-existing medical problems were identified and treated by resuming her pertinent home medications for those health issues. She tolerated her treatment regimen without any adverse effects or reactions reported.  During the course of her treatment, Laquenta's progress was monitored by observation & her daily report of symptom reduction noted. Her emotional & mental status were assessed & monitored by daily self-inventory reports completed by her & the clinical staff. She was evaluated daily by the treatment team for mood stability and plans for continued recovery after discharge.  She was offered further treatment options upon discharge on outpatient basis as listed below. She is provided with all the necessary information needed to make this appointment without problems.   Upon discharge, Bridget Carpenter  was both mentally & medically stable. She is adamntly denying suicidal/homicidal ideation, auditory/visual/tactile hallucinations, delusional thoughts & or paranoia. She left [facility] with all personal belongings in no apparent distress. Transportation per mother.  Physical Findings: AIMS: Facial and Oral Movements Muscles of Facial Expression: None, normal Lips and Perioral Area: None, normal Jaw: None, normal Tongue: None, normal,Extremity Movements Upper (arms, wrists, hands, fingers): None, normal Lower (legs, knees, ankles, toes): None, normal, Trunk Movements Neck, shoulders, hips: Minimal, Overall Severity Severity of abnormal movements (highest score from questions above): None, normal Incapacitation due to abnormal movements: None, normal Patient's awareness  of abnormal movements (rate only patient's report): No Awareness, Dental Status Current problems with teeth and/or dentures?: No Does patient usually wear dentures?: No  CIWA:  CIWA-Ar Total: 1 COWS:  COWS Total Score: 1  Musculoskeletal: Strength & Muscle Tone: within normal limits Gait & Station: normal Patient leans: N/A  Psychiatric Specialty Exam: Physical Exam  Nursing note and vitals reviewed. Constitutional: She appears well-developed.  HENT:  Head: Normocephalic.  Eyes: Pupils are equal, round, and reactive to light.  Neck: Normal range of motion.  Cardiovascular: Normal rate.   Respiratory: Effort normal.  GI: Soft.  Genitourinary:  Genitourinary Comments: Deferred  Musculoskeletal: Normal range of motion.  Neurological: She is alert.  Skin: Skin is warm.  Psychiatric: Her speech is normal and behavior is normal. Thought content normal. Her mood appears anxious. Cognition and memory are normal. She expresses impulsivity.    Review of Systems  Constitutional: Negative.   HENT: Negative.   Eyes: Negative.   Respiratory: Negative.   Cardiovascular: Negative.   Gastrointestinal: Negative.   Genitourinary: Negative.   Musculoskeletal: Negative.   Skin: Negative.   Neurological: Negative.   Endo/Heme/Allergies: Negative.   Psychiatric/Behavioral: Positive for depression (Stable), hallucinations (Hx. Psychosis) and substance abuse. Negative for memory loss and suicidal ideas. The patient has insomnia (Stable). The patient is not nervous/anxious.   All other systems reviewed and are negative.   Blood pressure (!) 102/58, pulse 87, temperature 98.6 F (37 C), temperature source Oral, resp. rate 18, height  (1.676 m), weight 54.9 kg (121 lb).Body mass index is 19.53 kg/m.  See H&P   Have you used any form of tobacco in the last 30 days? (Cigarettes, Smokeless Tobacco, Cigars, and/or Pipes): Yes  Has this patient used any form of tobacco in the last 30 days?  (Cigarettes, Smokeless Tobacco, Cigars, and/or Pipes) Yes, Yes, A prescription for an FDA-approved tobacco cessation medication was offered at discharge and the patient refused  Blood Alcohol level:  Lab Results  Component Value Date   [facility] <5 09/01/2017   ETH <5 07/16/2017   Metabolic Disorder Labs:  Lab Results  Component Value Date   HGBA1C 4.9 07/19/2017   MPG 94 07/19/2017   MPG 100 11/06/2016   Lab Results  Component Value Date   PROLACTIN 21.5 11/06/2016   PROLACTIN 49.4 (H) 08/13/2016   Lab Results  Component Value Date   CHOL 136 07/19/2017   TRIG 74 07/19/2017   HDL 67 07/19/2017   CHOLHDL 2.0 07/19/2017   VLDL 15 07/19/2017   LDLCALC 54 07/19/2017   LDLCALC 33 11/06/2016   See Psychiatric Specialty Exam and Suicide Risk Assessment completed by Attending Physician prior to discharge.  Discharge destination:  Home  Is patient on multiple antipsychotic therapies at discharge:  Yes,   Do you recommend tapering to monotherapy for antipsychotics?  Yes   Has Patient had three or more failed  trials of antipsychotic monotherapy by history:  No  Recommended Plan for Multiple Antipsychotic Therapies: NA  Allergies as of 09/07/2017      Reactions   Banana Hives      Medication List    STOP taking these medications   albuterol 108 (90 Base) MCG/ACT inhaler Commonly known as:  PROVENTIL HFA;VENTOLIN HFA   ARIPiprazole ER 400 MG Srer Replaced by:  ARIPiprazole 15 MG tablet   clobetasol cream 0.05 % Commonly known as:  TEMOVATE   clobetasol ointment 0.05 % Commonly known as:  TEMOVATE   divalproex 500 MG 24 hr tablet Commonly known as:  DEPAKOTE ER   ivermectin 3 MG Tabs tablet Commonly known as:  STROMECTOL   mirtazapine 15 MG tablet Commonly known as:  REMERON   paliperidone 6 MG 24 hr tablet Commonly known as:  INVEGA   permethrin 5 % cream Commonly known as:  ELIMITE   RA TUSSIN CGH/CHEST CONGEST DM 10-100 MG/5ML liquid Generic drug:   dextromethorphan-guaiFENesin   triamcinolone cream 0.1 % Commonly known as:  KENALOG     TAKE these medications     Indication  ARIPiprazole 15 MG tablet Commonly known as:  ABILIFY Take 0.5 tablets (7.5 mg total) by mouth at bedtime. For mood control Replaces:  ARIPiprazole ER 400 MG Srer  Indication:  Mood control   ARIPiprazole ER 400 MG Srer Inject 400 mg into the muscle every 30 (thirty) days. (Dur on 10-02-17): For mood control  Indication:  Mood control   benztropine 0.5 MG tablet Commonly known as:  COGENTIN Take 1 tablet (0.5 mg total) by mouth daily as needed for tremors (EPS).  Indication:  Extrapyramidal Reaction caused by Medications   buPROPion 75 MG tablet Commonly known as:  WELLBUTRIN Take 1 tablet (75 mg total) by mouth every morning. For depression  Indication:  Major Depressive Disorder   gabapentin 100 MG capsule Commonly known as:  NEURONTIN Take 1 capsule (100 mg total) by mouth 3 (three) times daily at 8am, 3pm and bedtime. What changed:  how much to take  how to take this  when to take this  Indication:  Agitation   hydrOXYzine 50 MG tablet Commonly known as:  ATARAX/VISTARIL Take 1 tablet (50 mg total) by mouth 3 (three) times daily as needed for anxiety.  Indication:  Feeling Anxious   traZODone 100 MG tablet Commonly known as:  DESYREL Take 1 tablet (100 mg total) by mouth at bedtime as needed for sleep.  Indication:  Trouble Sleeping   triamcinolone 0.1 % cream : eucerin Crea Apply 1 application topically 2 (two) times daily. For Eczema  Indication:  ECzema      Follow-up Information    [facility] Follow up on 09/11/2017.   Why:  at 8:00am for your hospital discharge appointment.  Contact information: [address] [place] [address] [phone]          Follow-up recommendations: Activity:  As tolerated Diet: As recommended by your primary care doctor. Keep all scheduled follow-up appointments  as recommended.   Comments: Patient is instructed prior to discharge to: Take all medications as prescribed by his/her mental healthcare provider. Report any adverse effects and or reactions from the medicines to his/her outpatient provider promptly. Patient has been instructed & cautioned: To not engage in alcohol and or illegal drug use while on prescription medicines. In the event of worsening symptoms, patient is instructed to call the crisis hotline, 911 and or go to the  nearest ED for appropriate evaluation and treatment of symptoms. To follow-up with his/her primary care provider for your other medical issues, concerns and or health care needs.    Signed: Sanjuana Kava, NP, PMHNP, FNP-BC 09/07/2017, 10:29 AM

## 2017-09-07 NOTE — Progress Notes (Signed)
Recreation Therapy Notes  Date: 09/07/17 Time: 1000 Location: 500 Hall Dayroom  Group Topic: Coping Skills  Goal Area(s) Addresses:  Patients will be able to identify positive coping skills. Patients will be able to identify benefits of coping skills post d/c.  Behavioral Response: Engaged  Intervention:  Dry erase marker, mind map worksheet, pencils  Activity: Mind map.  LRT gave each patient a blank copy of a mind map.  LRT and patients filled in the first eight boxes together.  Patients identified the areas were they would use coping skills as being in the hospital, stress, anxiety, bad news, drama/chaos, moving, loss of a loved one and health problems.  Individually, patients were to identify possible coping skills for each of the eight areas identified.  LRT would then reconvene the group and fill in the coping skills on the board.  Education: PharmacologistCoping Skills, Building control surveyorDischarge Planning.   Education Outcome: Acknowledges understanding/In group clarification offered/Needs additional education.   Clinical Observations/Feedback: Pt stated her coping skills were singing, prayer, be organized when moving, take what the doctor prescribes and reading.  Pt was very engaged and active during group session.   Bridget RancherMarjette Karmina Carpenter, LRT/CTRS         Bridget RancherLindsay, Bridget Brucker A 09/07/2017 12:28 PM

## 2017-09-07 NOTE — Progress Notes (Signed)
  [facility] :  Will you be returning to the same living situation after discharge:  Yes,  home with mother At discharge, do you have transportation home?: Yes,  mother Do you have the ability to pay for your medications: Yes,  mental health/MCD  Release of information consent forms completed and in the chart;  Patient's signature needed at discharge.  Patient to Follow up at: Follow-up Information    [facility] Follow up on 09/11/2017.   Why:  at 8:00am for your hospital discharge appointment.  Contact information: [address] [place] [address] [phone]           Next level of care provider [facility] Link:no  Safety Planning and Suicide Prevention discussed: Yes,  yes  Have you used any form of tobacco in the last 30 days? (Cigarettes, Smokeless Tobacco, Cigars, and/or Pipes): Yes  Has patient been referred to the Quitline?: Patient refused referral  Patient has been referred for addiction treatment: Pt. refused referral  Ida RogueRodney B Crista Nuon, LCSW 09/07/2017, 11:01 AM

## 2017-09-07 NOTE — Plan of Care (Signed)
Problem: [facility] Participation in Recreation Therapeutic Interventions Goal: STG-Patient will attend/participate in Rec Therapy Group Ses STG-The Patient will attend and participate in Recreation Therapy Group Sessions  Outcome: Completed/Met Date Met: 09/07/17 Pt attended and participated in coping skills and communication recreation therapy sessions.   [name], LRT/CTRS

## 2017-12-24 ENCOUNTER — Emergency Department
Admission: EM | Admit: 2017-12-24 | Discharge: 2017-12-25 | Disposition: A | Discharge status: Other Acute Inpatient Hospital | Payer: Medicaid Other | Attending: Emergency Medicine | Admitting: Emergency Medicine

## 2017-12-24 ENCOUNTER — Encounter: Payer: Self-pay | Admitting: Emergency Medicine

## 2017-12-24 DIAGNOSIS — N760 Acute vaginitis: Secondary | ICD-10-CM | POA: Insufficient documentation

## 2017-12-24 DIAGNOSIS — F1420 Cocaine dependence, uncomplicated: Secondary | ICD-10-CM | POA: Diagnosis present

## 2017-12-24 DIAGNOSIS — R45851 Suicidal ideations: Secondary | ICD-10-CM | POA: Diagnosis not present

## 2017-12-24 DIAGNOSIS — F603 Borderline personality disorder: Secondary | ICD-10-CM | POA: Diagnosis present

## 2017-12-24 DIAGNOSIS — B9689 Other specified bacterial agents as the cause of diseases classified elsewhere: Secondary | ICD-10-CM | POA: Diagnosis not present

## 2017-12-24 DIAGNOSIS — F418 Other specified anxiety disorders: Secondary | ICD-10-CM | POA: Insufficient documentation

## 2017-12-24 DIAGNOSIS — F3164 Bipolar disorder, current episode mixed, severe, with psychotic features: Secondary | ICD-10-CM | POA: Diagnosis not present

## 2017-12-24 DIAGNOSIS — Z87891 Personal history of nicotine dependence: Secondary | ICD-10-CM | POA: Diagnosis not present

## 2017-12-24 DIAGNOSIS — J45909 Unspecified asthma, uncomplicated: Secondary | ICD-10-CM | POA: Insufficient documentation

## 2017-12-24 DIAGNOSIS — R440 Auditory hallucinations: Secondary | ICD-10-CM | POA: Diagnosis present

## 2017-12-24 DIAGNOSIS — Z79899 Other long term (current) drug therapy: Secondary | ICD-10-CM | POA: Diagnosis not present

## 2017-12-24 DIAGNOSIS — F29 Unspecified psychosis not due to a substance or known physiological condition: Secondary | ICD-10-CM | POA: Diagnosis not present

## 2017-12-24 LAB — WET PREP, GENITAL
Sperm: NONE SEEN
TRICH WET PREP: NONE SEEN
YEAST WET PREP: NONE SEEN

## 2017-12-24 LAB — POCT PREGNANCY, URINE: PREG TEST UR: NEGATIVE

## 2017-12-24 LAB — CBC
HEMATOCRIT: 41.9 % (ref 35.0–47.0)
Hemoglobin: 13.9 g/dL (ref 12.0–16.0)
MCH: 31.1 pg (ref 26.0–34.0)
MCHC: 33.1 g/dL (ref 32.0–36.0)
MCV: 93.8 fL (ref 80.0–100.0)
PLATELETS: 266 10*3/uL (ref 150–440)
RBC: 4.46 MIL/uL (ref 3.80–5.20)
RDW: 13.7 % (ref 11.5–14.5)
WBC: 8.8 10*3/uL (ref 3.6–11.0)

## 2017-12-24 LAB — COMPREHENSIVE METABOLIC PANEL
ALBUMIN: 4 g/dL (ref 3.5–5.0)
ALT: 7 U/L — ABNORMAL LOW (ref 14–54)
ANION GAP: 5 (ref 5–15)
AST: 18 U/L (ref 15–41)
Alkaline Phosphatase: 47 U/L (ref 38–126)
BILIRUBIN TOTAL: 1.1 mg/dL (ref 0.3–1.2)
BUN: 12 mg/dL (ref 6–20)
CO2: 24 mmol/L (ref 22–32)
Calcium: 9 mg/dL (ref 8.9–10.3)
Chloride: 107 mmol/L (ref 101–111)
Creatinine, Ser: 0.96 mg/dL (ref 0.44–1.00)
GFR calc non Af Amer: >60 mL/min (ref >60)
GLUCOSE: 122 mg/dL — AB (ref 65–99)
POTASSIUM: 3.8 mmol/L (ref 3.5–5.1)
SODIUM: 136 mmol/L (ref 135–145)
Total Protein: 7.3 g/dL (ref 6.5–8.1)

## 2017-12-24 LAB — SALICYLATE LEVEL

## 2017-12-24 LAB — CHLAMYDIA/NGC RT PCR (ARMC ONLY)
Chlamydia Tr: NOT DETECTED
N gonorrhoeae: NOT DETECTED

## 2017-12-24 LAB — ETHANOL: Alcohol, Ethyl (B): <10 mg/dL (ref <10)

## 2017-12-24 LAB — ACETAMINOPHEN LEVEL

## 2017-12-24 MED ORDER — TRAZODONE HCL 100 MG PO TABS: 100.0000 mg | ORAL_TABLET | Freq: Every evening | ORAL | Status: DC | PRN

## 2017-12-24 MED ORDER — METRONIDAZOLE 500 MG PO TABS
500.0000 mg | ORAL_TABLET | Freq: Once | ORAL | Status: AC
  Administered 2017-12-24: 500 mg via ORAL
  Filled 2017-12-24: qty 1

## 2017-12-24 MED ORDER — METRONIDAZOLE 500 MG PO TABS
500.0000 mg | ORAL_TABLET | Freq: Two times a day (BID) | ORAL | Status: DC
  Administered 2017-12-25 (×2): 500 mg via ORAL
  Filled 2017-12-24 (×3): qty 1

## 2017-12-24 MED ORDER — HYDROXYZINE HCL 25 MG PO TABS
50.0000 mg | ORAL_TABLET | Freq: Three times a day (TID) | ORAL | Status: DC | PRN
Start: 2017-12-24 — End: 2017-12-25
  Administered 2017-12-25: 50 mg via ORAL
  Filled 2017-12-24: qty 2

## 2017-12-24 NOTE — BH Assessment (Signed)
Assessment Note  [name] is an 26 y.o. female presenting to the ED with concerns of auditory hallucinations and suicidal ideations with no plan or intent.   Patient reports thinking her child is screaming but when she goes to check on him, it's not her child screaming.  She says that the hallucinations have been occurring for the past 7 months.  She attributes the hallucinations to not being on her medications.  Patient reports she does not have the means to get to her appointments and to get her prescriptions filled.  Pt denies SI/HI while in the ED.  Diagnosis: Depression  Past Medical History:  Past Medical History:  Diagnosis Date  . Asthma   . Depression 2009   Inpatient psych admission for SI, dissociative fugue  . Dissociative disorder or reaction 2009  . Eczema   . H/O: suicide attempt   . ODD (oppositional defiant disorder)   . PTSD (post-traumatic stress disorder)   . Schizoaffective disorder (HCC)   . Substance abuse [facility](HCC)     Past Surgical History:  Procedure Laterality Date  . ADENOIDECTOMY    . TONSILLECTOMY      Family History:  Family History  Adopted: Yes  Problem Relation Age of Onset  . Mental illness Mother   . Mental illness Father     Social History:  reports that she has quit smoking. Her smoking use included cigarettes. She has a 1.00 pack-year smoking history. she has never used smokeless tobacco. She reports that she drinks alcohol. She reports that she does not use drugs.  Additional Social History:  Alcohol / Drug Use Pain Medications: See PTA Prescriptions: See PTA Over the Counter: See PTA History of alcohol / drug use?: No history of alcohol / drug abuse  CIWA: CIWA-Ar BP: (!) 99/59 Pulse Rate: 93 COWS:    Allergies:  Allergies  Allergen Reactions  . Banana Hives    Home Medications:  (Not in a hospital admission)  OB/GYN Status:  No LMP recorded.  General Assessment Data [facility] ED [facility]  Assessment: In system Is this a Tele or Face-to-Face Assessment?: Face-to-Face Is this an Initial Assessment or a Re-assessment for this encounter?: Initial Assessment Marital status: Single Maiden name: na Is patient pregnant?: No Pregnancy Status: No Living Arrangements: Children, Parent Can pt return to current living arrangement?: Yes Admission Status: Involuntary Is patient capable of signing voluntary admission?: No Referral Source: Self/Family/Friend Insurance type: Medicaid     Crisis Care Plan Living Arrangements: Children, Parent Legal Guardian: Other:(self) Name of Psychiatrist: none reported Name of Therapist: none reported  Education Status Is patient currently in school?: No Current Grade: na Highest grade of school patient has completed: na Name of school: na Contact person: na  Risk to self with the past 6 months Suicidal Ideation: Yes-Currently Present Has patient been a risk to self within the past 6 months prior to admission? : No Suicidal Intent: No Has patient had any suicidal intent within the past 6 months prior to admission? : No Is patient at risk for suicide?: No Suicidal Plan?: No Has patient had any suicidal plan within the past 6 months prior to admission? : No Access to Means: No What has been your use of drugs/alcohol within the last 12 months?: Pt denies Previous Attempts/Gestures: Yes How many times?: 1 Other Self Harm Risks: none identified Triggers for Past Attempts: Unknown Intentional Self Injurious Behavior: None Family Suicide History: No Recent stressful life event(s): Turmoil (Comment) Persecutory  voices/beliefs?: No Depression: Yes Depression Symptoms: Loss of interest in usual pleasures, Feeling worthless/self pity Substance abuse history and/or treatment for substance abuse?: No Suicide prevention information given to non-admitted patients: Not applicable  Risk to Others within the past 6 months Homicidal Ideation:  No Does patient have any lifetime risk of violence toward others beyond the six months prior to admission? : No Current Homicidal Intent: No Current Homicidal Plan: No Access to Homicidal Means: No Identified Victim: none idenitifed History of harm to others?: No Assessment of Violence: None Noted Does patient have access to weapons?: No Criminal Charges Pending?: No Does patient have a court date: No Is patient on probation?: No  Psychosis Hallucinations: Auditory Delusions: None noted  Mental Status Report Appearance/Hygiene: In scrubs, Disheveled Eye Contact: Good Motor Activity: Freedom of movement Speech: Logical/coherent Level of Consciousness: Quiet/awake Mood: Pleasant, Anxious Affect: Anxious Anxiety Level: Minimal Thought Processes: Relevant Judgement: Unimpaired Orientation: Person, Place, Time, Situation Obsessive Compulsive Thoughts/Behaviors: None  Cognitive Functioning Concentration: Normal Memory: Recent Intact, Remote Intact IQ: Average Insight: Fair Impulse Control: Fair Appetite: Good Sleep: No Change Vegetative Symptoms: None  ADLScreening [facility] Assessment Services) Patient's cognitive ability adequate to safely complete daily activities?: Yes Patient able to express need for assistance with ADLs?: Yes Independently performs ADLs?: Yes (appropriate for developmental age)  Prior Inpatient Therapy Prior Inpatient Therapy: Yes Prior Therapy Dates: multiple hospitalizations Prior Therapy Facilty/Provider(s): [facility] Reason for Treatment: bipolar  Prior Outpatient Therapy Prior Outpatient Therapy: No Prior Therapy Dates: na Prior Therapy Facilty/Provider(s): na Reason for Treatment: na Does patient have an ACCT team?: No Does patient have Intensive In-House Services?  : No Does patient have [facility] services? : No Does patient have P4CC services?: No  ADL Screening (condition at time of admission) Patient's cognitive ability adequate to safely  complete daily activities?: Yes Patient able to express need for assistance with ADLs?: Yes Independently performs ADLs?: Yes (appropriate for developmental age)       Abuse/Neglect Assessment (Assessment to be complete while patient is alone) Abuse/Neglect Assessment Can Be Completed: Yes Physical Abuse: Denies Verbal Abuse: Denies Sexual Abuse: Denies Exploitation of patient/patient's resources: Denies Self-Neglect: Denies Values / Beliefs Cultural Requests During Hospitalization: None Spiritual Requests During Hospitalization: None Consults Spiritual Care Consult Needed: No Social Work Consult Needed: No Merchant navy officerAdvance Directives (For Healthcare) Does Patient Have a Medical Advance Directive?: No Would patient like information on creating a medical advance directive?: No - Patient declined    Additional Information 1:1 In Past 12 Months?: No CIRT Risk: No Elopement Risk: No Does patient have medical clearance?: Yes     Disposition:  Disposition Initial Assessment Completed for this Encounter: Yes Disposition of Patient: Pending Review with psychiatrist  On Site Evaluation by:   Reviewed with Physician:    Artist Beachoxana C Kelaiah Escalona 12/24/2017 11:47 PM

## 2017-12-24 NOTE — ED Notes (Signed)
Pt is alert and oriented on admission to [facility]. Pt mood is sad and her affect is flat but she is pleasant and cooperative with staff. Writer discussed tx plan, provided extra blankets, lotion and nutrition. Pt denies SI/HI and AVh at this time and [facility] that [facility]. Pt seems to be asleep. 15 minute checks ongoing for safety.

## 2017-12-24 NOTE — ED Triage Notes (Addendum)
Pt ambulatory to triage with steady gait, caring suitcase. Pt reports she has had SI thoughts without plan, hearing voices x7 months. Pt reports she has not been on medication for 8 months due to the inability or means to get medication/help. Pt sts, "I don't like living with my family. I don't really have anyone to help me. Im kinda homeless." Pt is calm and cooperative in triage. Pt expressing the need to see physiatrist and get back on medication. Pt brought to ED by mother, mother dropped pt off and left to go back home. Pt sts, "I keep hearing children screaming but I know they are not really there."

## 2017-12-24 NOTE — ED Provider Notes (Signed)
[name] Emergency Department Provider Note  Time seen: 7:46 PM  I have reviewed the triage vital signs and the nursing notes.   HISTORY  Chief Complaint Suicidal    HPI [name] is a 26 y.o. female with a past medical history of asthma, depression, scheduled for hernia, substance use who presents to the emergency department hearing voices and having suicidal thoughts.  According to the patient for the past 6 or 7 months she has been hearing voices which she describes as children screaming.  States she will go check on her child but her child is okay.  Lives with her child who is 26 years old.  Patient also states suicidal ideation but denies any specific plan to do so.  Patient states she was on psychiatric medications, Abilify, approximately 1 year ago but has been off medications for approximately 1 year.  Patient also states she has been having vaginal discharge and would like to be evaluated for sexually transmitted diseases.  Patient also states a review of systems that she is having dysuria as well.   Past Medical History:  Diagnosis Date  . Asthma   . Depression 2009   Inpatient psych admission for SI, dissociative fugue  . Dissociative disorder or reaction 2009  . Eczema   . H/O: suicide attempt   . ODD (oppositional defiant disorder)   . PTSD (post-traumatic stress disorder)   . Schizoaffective disorder (HCC)   . Substance abuse [facility](HCC)     Patient Active Problem List   Diagnosis Date Noted  . PTSD (post-traumatic stress disorder) 09/02/2017  . Bipolar disorder, curr episode mixed, severe, with psychotic features (HCC) 09/02/2017  . MRSA carrier 07/18/2017  . Overdose 07/16/2017  . Aspiration pneumonia (HCC) 07/16/2017  . Acute respiratory failure (HCC) 07/16/2017  . Polysubstance abuse (HCC) 07/16/2017  . Eczema 05/08/2017  . Borderline personality disorder (HCC) 11/06/2016  . Cocaine use disorder, severe, dependence (HCC)  11/06/2016  . Cannabis use disorder, moderate, dependence (HCC) 11/06/2016  . Alcohol use disorder, mild, abuse 11/06/2016  . Asthma 09/23/2011  . Tobacco use disorder 09/15/2009    Past Surgical History:  Procedure Laterality Date  . ADENOIDECTOMY    . TONSILLECTOMY      Prior to Admission medications   Medication Sig Start Date End Date Taking? Authorizing Provider  ARIPiprazole (ABILIFY) 15 MG tablet Take 0.5 tablets (7.5 mg total) by mouth at bedtime. For mood control 09/07/17   Armandina StammerNwoko, Agnes I, NP  ARIPiprazole ER 400 MG SRER Inject 400 mg into the muscle every 30 (thirty) days. (Dur on 10-02-17): For mood control 10/02/17   Armandina StammerNwoko, Agnes I, NP  benztropine (COGENTIN) 0.5 MG tablet Take 1 tablet (0.5 mg total) by mouth daily as needed for tremors (EPS). 09/07/17   Armandina StammerNwoko, Agnes I, NP  buPROPion (WELLBUTRIN) 75 MG tablet Take 1 tablet (75 mg total) by mouth every morning. For depression 09/08/17   Armandina StammerNwoko, Agnes I, NP  gabapentin (NEURONTIN) 100 MG capsule Take 1 capsule (100 mg total) by mouth 3 (three) times daily at 8am, 3pm and bedtime. 09/07/17   Armandina StammerNwoko, Agnes I, NP  hydrOXYzine (ATARAX/VISTARIL) 50 MG tablet Take 1 tablet (50 mg total) by mouth 3 (three) times daily as needed for anxiety. 09/07/17   Armandina StammerNwoko, Agnes I, NP  traZODone (DESYREL) 100 MG tablet Take 1 tablet (100 mg total) by mouth at bedtime as needed for sleep. 09/07/17   Armandina StammerNwoko, Agnes I, NP  Triamcinolone Acetonide (TRIAMCINOLONE 0.1 %  CREAM : EUCERIN) CREA Apply 1 application topically 2 (two) times daily. For Eczema 09/07/17   Sanjuana KavaNwoko, Agnes I, NP    Allergies  Allergen Reactions  . Banana Hives    Family History  Adopted: Yes  Problem Relation Age of Onset  . Mental illness Mother   . Mental illness Father     Social History Social History   Tobacco Use  . Smoking status: Former Smoker    Packs/day: 0.50    Years: 2.00    Pack years: 1.00    Types: Cigarettes  . Smokeless tobacco: Never Used  Substance Use Topics   . Alcohol use: Yes    Comment: sometimes  . Drug use: No    Review of Systems Constitutional: Negative for fever. Eyes: Negative for visual changes. ENT: Negative for congestion Cardiovascular: Negative for chest pain. Respiratory: Negative for shortness of breath.  Mild cough times months per patient.   Gastrointestinal: Negative for abdominal pain, vomiting and diarrhea. Genitourinary: Positive for dysuria times several weeks.  Positive for vaginal discharge times several weeks. Musculoskeletal: Negative for back pain. Skin: Negative for rash. Neurological: Negative for headache All other ROS negative  ____________________________________________   PHYSICAL EXAM:  VITAL SIGNS: ED Triage Vitals  Enc Vitals Group     BP 12/24/17 1928 (!) 99/59     Pulse Rate 12/24/17 1928 93     Resp 12/24/17 1928 18     Temp 12/24/17 1928 98 F (36.7 C)     Temp Source 12/24/17 1928 Oral     SpO2 12/24/17 1928 100 %     Weight 12/24/17 1927 121 lb (54.9 kg)     Height --      Head Circumference --      Peak Flow --      Pain Score --      Pain Loc --      Pain Edu? --      Excl. in GC? --    Constitutional: Alert and oriented. Well appearing and in no distress. Eyes: Normal exam ENT   Head: Normocephalic and atraumatic.   Mouth/Throat: Mucous membranes are moist. Cardiovascular: Normal rate, regular rhythm. No murmur Respiratory: Normal respiratory effort without tachypnea nor retractions. Breath sounds are clear  Gastrointestinal: Soft and nontender. No distention.   Musculoskeletal: Nontender with normal range of motion in all extremities.  Neurologic:  Normal speech and language. No gross focal neurologic deficits Skin:  Skin is warm, dry and intact.  Psychiatric: Flat affect, admits suicidal ideation as well as hearing voices  ____________________________________________   INITIAL IMPRESSION / ASSESSMENT AND PLAN / ED COURSE  Pertinent labs & imaging results  that were available during my care of the patient were reviewed by me and considered in my medical decision making (see chart for details).  Presents emergency department for hearing voices times several months as well as suicidal ideation but no specific plan.  Differential would include substance use, intoxication, psychiatric condition, schizophrenia/psychosis, depression.  We will place the patient under IVC given suicidal thoughts until psychiatry can adequately evaluate.  We will check labs and continue to closely monitor.  Overall the patient appears well, no distress lying in bed calmly eating food.  Wet prep positive for bacterial vaginitis we will treat with Flagyl.  Chlamydia/gonorrhea pending.    ____________________________________________   FINAL CLINICAL IMPRESSION(S) / ED DIAGNOSES  Suicidal ideation auditory hallucinations    Minna AntisPaduchowski, Mosiah Bastin, MD 12/24/17 [id number]

## 2017-12-24 NOTE — ED Notes (Signed)
POC completed in error on wrong pt. No POC completed at this time.

## 2017-12-24 NOTE — ED Notes (Signed)
Food tray given to pt

## 2017-12-25 ENCOUNTER — Other Ambulatory Visit: Payer: Self-pay

## 2017-12-25 ENCOUNTER — Inpatient Hospital Stay
Admission: AD | Admit: 2017-12-25 | Discharge: 2017-12-28 | DRG: 885 | Disposition: A | Discharge status: Home / Self Care | Payer: Medicaid Other | Attending: Psychiatry | Admitting: Psychiatry

## 2017-12-25 DIAGNOSIS — F1290 Cannabis use, unspecified, uncomplicated: Secondary | ICD-10-CM | POA: Diagnosis present

## 2017-12-25 DIAGNOSIS — Z79899 Other long term (current) drug therapy: Secondary | ICD-10-CM | POA: Diagnosis not present

## 2017-12-25 DIAGNOSIS — B9689 Other specified bacterial agents as the cause of diseases classified elsewhere: Secondary | ICD-10-CM

## 2017-12-25 DIAGNOSIS — J45909 Unspecified asthma, uncomplicated: Secondary | ICD-10-CM | POA: Diagnosis present

## 2017-12-25 DIAGNOSIS — Z9119 Patient's noncompliance with other medical treatment and regimen: Secondary | ICD-10-CM

## 2017-12-25 DIAGNOSIS — G4700 Insomnia, unspecified: Secondary | ICD-10-CM | POA: Diagnosis present

## 2017-12-25 DIAGNOSIS — F3164 Bipolar disorder, current episode mixed, severe, with psychotic features: Secondary | ICD-10-CM | POA: Diagnosis present

## 2017-12-25 DIAGNOSIS — Z915 Personal history of self-harm: Secondary | ICD-10-CM | POA: Diagnosis not present

## 2017-12-25 DIAGNOSIS — Z818 Family history of other mental and behavioral disorders: Secondary | ICD-10-CM

## 2017-12-25 DIAGNOSIS — T43596A Underdosing of other antipsychotics and neuroleptics, initial encounter: Secondary | ICD-10-CM | POA: Diagnosis present

## 2017-12-25 DIAGNOSIS — Z91128 Patient's intentional underdosing of medication regimen for other reason: Secondary | ICD-10-CM | POA: Diagnosis not present

## 2017-12-25 DIAGNOSIS — Z87891 Personal history of nicotine dependence: Secondary | ICD-10-CM

## 2017-12-25 DIAGNOSIS — R45851 Suicidal ideations: Secondary | ICD-10-CM | POA: Diagnosis present

## 2017-12-25 DIAGNOSIS — F17200 Nicotine dependence, unspecified, uncomplicated: Secondary | ICD-10-CM | POA: Diagnosis present

## 2017-12-25 DIAGNOSIS — N760 Acute vaginitis: Secondary | ICD-10-CM | POA: Diagnosis present

## 2017-12-25 DIAGNOSIS — F4310 Post-traumatic stress disorder, unspecified: Secondary | ICD-10-CM | POA: Diagnosis present

## 2017-12-25 DIAGNOSIS — F1490 Cocaine use, unspecified, uncomplicated: Secondary | ICD-10-CM | POA: Diagnosis present

## 2017-12-25 DIAGNOSIS — F29 Unspecified psychosis not due to a substance or known physiological condition: Secondary | ICD-10-CM | POA: Diagnosis not present

## 2017-12-25 MED ORDER — QUETIAPINE FUMARATE 25 MG PO TABS: 50.0000 mg | ORAL_TABLET | Freq: Every day | ORAL | Status: DC

## 2017-12-25 MED ORDER — CLINDAMYCIN PHOSPHATE 2 % VA CREA: 1.0000 | TOPICAL_CREAM | Freq: Every day | VAGINAL | Status: DC

## 2017-12-25 MED ORDER — ARIPIPRAZOLE ER 400 MG IM SRER: 400.0000 mg | Freq: Once | INTRAMUSCULAR | Status: DC

## 2017-12-25 MED ORDER — TRAZODONE HCL 100 MG PO TABS: 100.0000 mg | ORAL_TABLET | Freq: Every evening | ORAL | Status: DC | PRN

## 2017-12-25 MED ORDER — FLUOXETINE HCL 20 MG PO CAPS
20.0000 mg | ORAL_CAPSULE | Freq: Every day | ORAL | Status: DC
  Administered 2017-12-26: 20 mg via ORAL
  Filled 2017-12-25: qty 1

## 2017-12-25 MED ORDER — MAGNESIUM HYDROXIDE 400 MG/5ML PO SUSP: 30.0000 mL | Freq: Every day | ORAL | Status: DC | PRN

## 2017-12-25 MED ORDER — HYDROXYZINE HCL 50 MG PO TABS
50.0000 mg | ORAL_TABLET | Freq: Three times a day (TID) | ORAL | Status: DC | PRN
Start: 2017-12-25 — End: 2017-12-25

## 2017-12-25 MED ORDER — ACETAMINOPHEN 325 MG PO TABS
650.0000 mg | ORAL_TABLET | Freq: Four times a day (QID) | ORAL | Status: DC | PRN
  Administered 2017-12-26: 650 mg via ORAL
  Filled 2017-12-25: qty 2

## 2017-12-25 MED ORDER — FLUOXETINE HCL 20 MG PO CAPS
20.0000 mg | ORAL_CAPSULE | Freq: Every day | ORAL | Status: DC
  Administered 2017-12-25: 20 mg via ORAL
  Filled 2017-12-25: qty 1

## 2017-12-25 MED ORDER — HYDROXYZINE HCL 50 MG PO TABS: 50.0000 mg | ORAL_TABLET | Freq: Three times a day (TID) | ORAL | Status: DC | PRN

## 2017-12-25 MED ORDER — QUETIAPINE FUMARATE 25 MG PO TABS
50.0000 mg | ORAL_TABLET | Freq: Every day | ORAL | Status: DC
  Administered 2017-12-25: 50 mg via ORAL
  Filled 2017-12-25: qty 2

## 2017-12-25 MED ORDER — METRONIDAZOLE 500 MG PO TABS
500.0000 mg | ORAL_TABLET | Freq: Two times a day (BID) | ORAL | Status: DC
  Administered 2017-12-26 – 2017-12-28 (×5): 500 mg via ORAL
  Filled 2017-12-25 (×7): qty 1

## 2017-12-25 MED ORDER — ARIPIPRAZOLE ER 400 MG IM SRER
400.0000 mg | Freq: Once | INTRAMUSCULAR | Status: AC
  Administered 2017-12-25: 400 mg via INTRAMUSCULAR
  Filled 2017-12-25 (×2): qty 400

## 2017-12-25 MED ORDER — ALUM & MAG HYDROXIDE-SIMETH 200-200-20 MG/5ML PO SUSP: 30.0000 mL | ORAL | Status: DC | PRN

## 2017-12-25 MED ORDER — CLINDAMYCIN PHOSPHATE 2 % VA CREA
1.0000 | TOPICAL_CREAM | Freq: Every day | VAGINAL | Status: DC
  Filled 2017-12-25: qty 40

## 2017-12-25 NOTE — BH Assessment (Signed)
[facility] Dr. Toni Amendlapacs.  Attending Physician pending Patient has been assigned to room pending, [facility] Charge [facility] Gwen.   Intake Paper Work has been signed and placed on patient chart.  ER staff is aware of the admission [facility]( [facility].; Dr. Lenard LancePaduchowski, ER MD; Amy Patient's Nurse & Mertie ClauseJeanelle Patient Access).

## 2017-12-25 NOTE — Tx Team (Signed)
Initial Treatment Plan 12/25/2017 11:33 PM [place] Karie KirksShayan Jacob [id number]    PATIENT STRESSORS: Financial difficulties Health problems Occupational concerns   PATIENT STRENGTHS: Average or above average intelligence Capable of independent living Communication skills   PATIENT IDENTIFIED PROBLEMS: Depression 12/25/17  Suicidal Ideations 12/25/17  Auditory Hallucinations 12/25/17  Ineffective Coping Skills 12/25/17               DISCHARGE CRITERIA:  Ability to meet basic life and health needs Adequate post-discharge living arrangements Improved stabilization in mood, thinking, and/or behavior  PRELIMINARY DISCHARGE PLAN: Attend aftercare/continuing care group Attend PHP/IOP Return to previous living arrangement  PATIENT/FAMILY INVOLVEMENT: This treatment plan has been presented to and reviewed with the patient, Bridget Carpenter.  The patient and family have been given the opportunity to ask questions and make suggestions.  Berkley HarveySlade I Nijae Doyel, RN 12/25/2017, 11:33 PM

## 2017-12-25 NOTE — Progress Notes (Signed)
D: Received patient from [facility]. Patient skin assessment completed with Margret RN and [facility] [place], skin is intact, no contraband found. Patient denies SI/HI at this time. Patient endorses auditory hallucinations states "they sound like my daughter, they are telling me help them." Patient reported no pain at this time.   A: Patient oriented to unit/room/call light. Patient was offered support and encouragement. Patient was encourage to attend groups, participate in unit activities and continue with plan of care. Q x 15 minute observation checks were completed for safety.   R: Patient has no complaints at this time. Patient is receptive to treatment and safety maintained on unit.

## 2017-12-25 NOTE — ED Notes (Signed)
Patient asleep in room. No noted distress or abnormal behavior. Will continue 15 minute checks and observation by security cameras for safety. 

## 2017-12-25 NOTE — ED Provider Notes (Signed)
-----------------------------------------   7:16 AM on 12/25/2017 -----------------------------------------   Blood pressure (!) 91/47, pulse 70, temperature (!) 97.5 F (36.4 C), temperature source Oral, resp. rate 18, weight 54.9 kg (121 lb), SpO2 95 %.  The patient had no acute events since last update.  Calm and cooperative at this time.  Disposition is pending Psychiatry/Behavioral Medicine team recommendations.     Arnaldo NatalMalinda, Nain Rudd F, MD 12/25/17 [id number]

## 2017-12-25 NOTE — ED Notes (Signed)
Patient received PM snack. 

## 2017-12-25 NOTE — Consult Note (Signed)
[facility] Consult   Reason for Consult: Consult for 26 year old woman who comes to the hospital complaining of auditory hallucinations and suicidal thoughts Referring Physician: Rip Harbour Patient Identification: Bridget Carpenter MRN:  [id number] Principal Diagnosis: Bipolar disorder, curr episode mixed, severe, with psychotic features [facility]) Diagnosis:   Patient Active Problem List   Diagnosis Date Noted  . Bacterial vaginosis [N76.0, B96.89] 12/25/2017  . PTSD (post-traumatic stress disorder) [F43.10] 09/02/2017  . Bipolar disorder, curr episode mixed, severe, with psychotic features ([place]) [F31.64] 09/02/2017  . 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 Overdose [T50.901A] 07/16/2017  . Aspiration pneumonia ([place]) [J69.0] 07/16/2017  . Acute respiratory failure ([place]) [J96.00] 07/16/2017  . Polysubstance abuse ([place]) [F19.10] 07/16/2017  . Eczema [L30.9] 05/08/2017  . Borderline personality disorder ([place]) [F60.3] 11/06/2016  . Cocaine use disorder, severe, dependence ([place]) [F14.20] 11/06/2016  . Cannabis use disorder, moderate, dependence ([place]) [F12.20] 11/06/2016  . Alcohol use disorder, mild, abuse [F10.10] 11/06/2016  . Asthma [J45.909] 09/23/2011  . Tobacco use disorder [F17.200] 09/15/2009    Total Time spent with patient: 1 hour  Subjective:   Bridget Carpenter is a 26 y.o. female patient admitted with "I am hearing voices".  HPI: Patient interviewed chart reviewed.  Reviewed labs including the results of her pelvic exam.  Reviewed old chart.  26 year old woman comes to the emergency room saying she has been hearing voices for the last 2 months.  They are getting more and more insistent.  Mostly it sounds like her daughter's voice.  She cannot tell what it is saying.  Her sleep is been very poor and disrupted.  Her mood feels down and irritable.  She has been living in a chaotic lifestyle moving from one place to another using crack cocaine regularly.  Also  admits to occasional cannabis and alcohol use but overwhelming substance abuse problem by her report is cocaine.  Has had some suicidal thoughts without intention.  No homicidal thoughts.  Not currently compliant with oral psychiatric medicine.  She does get Abilify maintain a but she says that the last injection she actually gave herself and she thinks it was at least a month ago.  Social history: She theoretically lives with her mother which is where her daughter also lives but in reality he stays out in the streets most of the time.  Does get disability.  Medical history: She is complaining of symptoms of vaginal discomfort.  She has a history mostly of mild asthma no other major medical problems outside of the substance abuse.  Substance abuse history: Long-standing issues with drugs including cocaine and amphetamines and marijuana.  Occasional alcohol.  Trouble staying clean for very long.  Past Psychiatric History: Multiple hospitalizations.  Prior diagnoses include bipolar disorder schizoaffective disorder and PTSD.  Has been on multiple antipsychotics and antidepressants.  She specifically requests of me that we restart fluoxetine.  She also says she would like some medicine to help her to sleep.  She does have a history of suicide attempts.  No history of violence.  Risk to Self: Suicidal Ideation: Yes-Currently Present Suicidal Intent: No Is patient at risk for suicide?: No Suicidal Plan?: No Access to Means: No What has been your use of drugs/alcohol within the last 12 months?: Pt denies How many times?: 1 Other Self Harm Risks: none identified Triggers for Past Attempts: Unknown Intentional Self Injurious Behavior: None Risk to Others: Homicidal Ideation: No Current Homicidal Intent: No Current Homicidal Plan: No Access to Homicidal Means: No Identified Victim:  none idenitifed History of harm to others?: No Assessment of Violence: None Noted Does patient have access to  weapons?: No Criminal Charges Pending?: No Does patient have a court date: No Prior Inpatient Therapy: Prior Inpatient Therapy: Yes Prior Therapy Dates: multiple hospitalizations Prior Therapy Facilty/Provider(s): [facility] Reason for Treatment: bipolar Prior Outpatient Therapy: Prior Outpatient Therapy: No Prior Therapy Dates: na Prior Therapy Facilty/Provider(s): na Reason for Treatment: na Does patient have an ACCT team?: No Does patient have Intensive In-House Services?  : No Does patient have [facility] services? : No Does patient have P4CC services?: No  Past Medical History:  Past Medical History:  Diagnosis Date  . Asthma   . Depression 2009   Inpatient psych admission for SI, dissociative fugue  . Dissociative disorder or reaction 2009  . Eczema   . H/O: suicide attempt   . ODD (oppositional defiant disorder)   . PTSD (post-traumatic stress disorder)   . Schizoaffective disorder ([place])   . Substance abuse [facility])     Past Surgical History:  Procedure Laterality Date  . ADENOIDECTOMY    . TONSILLECTOMY     Family History:  Family History  Adopted: Yes  Problem Relation Age of Onset  . Mental illness Mother   . Mental illness Father    Family Psychiatric  History: Family history positive for substance abuse Social History:  Social History   Substance and Sexual Activity  Alcohol Use Yes   Comment: sometimes     Social History   Substance and Sexual Activity  Drug Use No    Social History   Socioeconomic History  . Marital status: Single    Spouse name: None  . Number of children: None  . Years of education: None  . Highest education level: None  Social Needs  . Financial resource strain: None  . Food insecurity - worry: None  . Food insecurity - inability: None  . Transportation needs - medical: None  . Transportation needs - non-medical: None  Occupational History  . None  Tobacco Use  . Smoking status: Former Smoker    Packs/day: 0.50    Years:  2.00    Pack years: 1.00    Types: Cigarettes  . Smokeless tobacco: Never Used  Substance and Sexual Activity  . Alcohol use: Yes    Comment: sometimes  . Drug use: No  . Sexual activity: Yes  Other Topics Concern  . None  Social History Narrative  . None   Additional Social History:    Allergies:   Allergies  Allergen Reactions  . Banana Hives    Labs:  Results for orders placed or performed during the hospital encounter of 12/24/17 (from the past 48 hour(s))  Comprehensive metabolic panel     Status: Abnormal   Collection Time: 12/24/17  7:23 PM  Result Value Ref Range   Sodium 136 135 - 145 mmol/L   Potassium 3.8 3.5 - 5.1 mmol/L   Chloride 107 101 - 111 mmol/L   CO2 24 22 - 32 mmol/L   Glucose, Bld 122 (H) 65 - 99 mg/dL   BUN 12 6 - 20 mg/dL   Creatinine, Ser 0.96 0.44 - 1.00 mg/dL   Calcium 9.0 8.9 - 10.3 mg/dL   Total Protein 7.3 6.5 - 8.1 g/dL   Albumin 4.0 3.5 - 5.0 g/dL   AST 18 15 - 41 U/L   ALT 7 (L) 14 - 54 U/L   Alkaline Phosphatase 47 38 - 126 U/L   Total  Bilirubin 1.1 0.3 - 1.2 mg/dL   GFR calc non Af Amer >60 >60 mL/min   GFR calc Af Amer >60 >60 mL/min    Comment: (NOTE) The eGFR has been calculated using the CKD EPI equation. This calculation has not been validated in all clinical situations. eGFR's persistently <60 mL/min signify possible Chronic Kidney Disease.    Anion gap 5 5 - 15    Comment: Performed at [facility], [address] [place], [place] [address]  Ethanol     Status: None   Collection Time: 12/24/17  7:23 PM  Result Value Ref Range   Alcohol, Ethyl (B) <10 <10 mg/dL    Comment:        LOWEST DETECTABLE LIMIT FOR SERUM ALCOHOL IS 10 mg/dL FOR MEDICAL PURPOSES ONLY Performed at [facility], [address] [place], [place] [address]   Salicylate level     Status: None   Collection Time: 12/24/17  7:23 PM  Result Value Ref Range   Salicylate Lvl <9.8 2.8 - 30.0 mg/dL    Comment: Performed at  [facility], [address] [place], [place] [address]  Acetaminophen level     Status: Abnormal   Collection Time: 12/24/17  7:23 PM  Result Value Ref Range   Acetaminophen (Tylenol), Serum <10 (L) 10 - 30 ug/mL    Comment:        THERAPEUTIC CONCENTRATIONS VARY SIGNIFICANTLY. A RANGE OF 10-30 ug/mL MAY BE AN EFFECTIVE CONCENTRATION FOR MANY PATIENTS. HOWEVER, SOME ARE BEST TREATED AT CONCENTRATIONS OUTSIDE THIS RANGE. ACETAMINOPHEN CONCENTRATIONS >150 ug/mL AT 4 HOURS AFTER INGESTION AND >50 ug/mL AT 12 HOURS AFTER INGESTION ARE OFTEN ASSOCIATED WITH TOXIC REACTIONS. Performed at [facility], [address] [place], [place] [address]   cbc     Status: None   Collection Time: 12/24/17  7:23 PM  Result Value Ref Range   WBC 8.8 3.6 - 11.0 K/uL   RBC 4.46 3.80 - 5.20 MIL/uL   Hemoglobin 13.9 12.0 - 16.0 g/dL   HCT 41.9 35.0 - 47.0 %   MCV 93.8 80.0 - 100.0 fL   MCH 31.1 26.0 - 34.0 pg   MCHC 33.1 32.0 - 36.0 g/dL   RDW 13.7 11.5 - 14.5 %   Platelets 266 150 - 440 K/uL    Comment: Performed at [facility], [address] [place], [place] [address]  Wet prep, genital     Status: Abnormal   Collection Time: 12/24/17  8:13 PM  Result Value Ref Range   Yeast Wet Prep HPF POC NONE SEEN NONE SEEN   Trich, Wet Prep NONE SEEN NONE SEEN   Clue Cells Wet Prep HPF POC PRESENT (A) NONE SEEN   WBC, Wet Prep HPF POC MANY (A) NONE SEEN   Sperm NONE SEEN     Comment: Performed at [facility], [address] [place], [place] [address]  [place] rt PCR [facility] only)     Status: None   Collection Time: 12/24/17  8:13 PM  Result Value Ref Range   Specimen source GC/Chlam ENDOCERVICAL    Chlamydia Tr NOT DETECTED NOT DETECTED   N gonorrhoeae NOT DETECTED NOT DETECTED    Comment: (NOTE) 100  This methodology has not been evaluated in pregnant women or in 200  patients with a history of hysterectomy. 300 400  This methodology will not be  performed on patients less than 67  years of age. Performed at [facility],  [place], [place] [address]   Pregnancy, urine POC     Status: None   Collection Time: 12/24/17  8:44 PM  Result Value Ref Range   Preg Test, Ur NEGATIVE NEGATIVE    Comment:        THE SENSITIVITY OF THIS METHODOLOGY IS >24 mIU/mL     Current Facility-Administered Medications  Medication Dose Route Frequency Provider Last Rate Last Dose  . ARIPiprazole ER SRER 400 mg  400 mg Intramuscular Once Nyree Yonker T, MD      . clindamycin (CLEOCIN) 2 % vaginal cream 1 Applicatorful  1 Applicatorful Vaginal QHS Kirandeep Fariss T, MD      . FLUoxetine (PROZAC) capsule 20 mg  20 mg Oral Daily Antawn Sison T, MD      . hydrOXYzine (ATARAX/VISTARIL) tablet 50 mg  50 mg Oral TID PRN Harvest Dark, MD      . metroNIDAZOLE (FLAGYL) tablet 500 mg  500 mg Oral Q12H Harvest Dark, MD   500 mg at 12/25/17 1114  . QUEtiapine (SEROQUEL) tablet 50 mg  50 mg Oral QHS Amandamarie Feggins T, MD      . traZODone (DESYREL) tablet 100 mg  100 mg Oral QHS PRN Harvest Dark, MD       Current Outpatient Medications  Medication Sig Dispense Refill  . ARIPiprazole (ABILIFY) 10 MG tablet Take 10 mg by mouth at bedtime.    . ARIPiprazole ER 400 MG SRER Inject 400 mg into the muscle every 30 (thirty) days. (Dur on 10-02-17): For mood control (Patient taking differently: Inject 400 mg into the muscle every 30 (thirty) days. Last filled on 11/11/2017) 1 each 0  . benztropine (COGENTIN) 1 MG tablet Take 1 mg by mouth 2 (two) times daily as needed for tremors.    . ARIPiprazole (ABILIFY) 15 MG tablet Take 0.5 tablets (7.5 mg total) by mouth at bedtime. For mood control (Patient not taking: Reported on 12/24/2017) 21 tablet 0  . benztropine (COGENTIN) 0.5 MG tablet Take 1 tablet (0.5 mg total) by mouth daily as needed for tremors (EPS). (Patient not taking: Reported on 12/24/2017) 18 tablet 0  . buPROPion  (WELLBUTRIN) 75 MG tablet Take 1 tablet (75 mg total) by mouth every morning. For depression (Patient not taking: Reported on 12/24/2017) 30 tablet 0  . gabapentin (NEURONTIN) 100 MG capsule Take 1 capsule (100 mg total) by mouth 3 (three) times daily at 8am, 3pm and bedtime. (Patient not taking: Reported on 12/24/2017) 90 capsule 0  . hydrOXYzine (ATARAX/VISTARIL) 50 MG tablet Take 1 tablet (50 mg total) by mouth 3 (three) times daily as needed for anxiety. (Patient not taking: Reported on 12/24/2017) 60 tablet 0  . traZODone (DESYREL) 100 MG tablet Take 1 tablet (100 mg total) by mouth at bedtime as needed for sleep. (Patient not taking: Reported on 12/24/2017) 30 tablet 0  . Triamcinolone Acetonide (TRIAMCINOLONE 0.1 % CREAM : EUCERIN) CREA Apply 1 application topically 2 (two) times daily. For Eczema      Musculoskeletal: Strength & Muscle Tone: within normal limits Gait & Station: normal Patient leans: N/A  Psychiatric Specialty Exam: Physical Exam  Nursing note and vitals reviewed. Constitutional: She appears well-developed and well-nourished.  HENT:  Head: Normocephalic and atraumatic.  Eyes: Conjunctivae are normal. Pupils are equal, round, and reactive to light.  Neck: Normal range of motion.  Cardiovascular: Regular rhythm and normal heart sounds.  Respiratory: Effort normal. No respiratory distress.  GI: Soft.  Musculoskeletal: Normal range  of motion.  Neurological: She is alert.  Skin: Skin is warm and dry.  Psychiatric: Her speech is normal and behavior is normal. Her mood appears anxious. Her affect is blunt. Cognition and memory are impaired. She expresses impulsivity. She exhibits a depressed mood. She expresses suicidal ideation. She expresses no suicidal plans. She exhibits abnormal recent memory.    Review of Systems  Constitutional: Negative.   HENT: Negative.   Eyes: Negative.   Respiratory: Negative.   Cardiovascular: Negative.   Gastrointestinal: Negative.    Genitourinary:       Patient complains of discomfort in her vaginal area and believes that she may have a sexually transmitted disease  Musculoskeletal: Negative.   Skin: Negative.   Neurological: Negative.   Psychiatric/Behavioral: Positive for depression, hallucinations, substance abuse and suicidal ideas. Negative for memory loss. The patient is nervous/anxious and has insomnia.     Blood pressure (!) 91/47, pulse 70, temperature (!) 97.5 F (36.4 C), temperature source Oral, resp. rate 18, weight 121 lb (54.9 kg), SpO2 95 %.Body mass index is 19.53 kg/m.  General Appearance: Casual  Eye Contact:  Good  Speech:  Clear and Coherent  Volume:  Decreased  Mood:  Anxious  Affect:  Congruent  Thought Process:  Goal Directed  Orientation:  Full (Time, Place, and Person)  Thought Content:  Logical and Hallucinations: Auditory  Suicidal Thoughts:  Yes.  without intent/plan  Homicidal Thoughts:  No  Memory:  Immediate;   Fair Recent;   Fair Remote;   Fair  Judgement:  Fair  Insight:  Fair  Psychomotor Activity:  Decreased  Concentration:  Concentration: Fair  Recall:  [employer] of Knowledge:  Fair  Language:  Fair  Akathisia:  No  Handed:  Right  AIMS (if indicated):     Assets:  Communication Skills Desire for Improvement Financial Resources/Insurance Housing  ADL's:  Intact  Cognition:  WNL  Sleep:        Treatment Plan Summary: Daily contact with patient to assess and evaluate symptoms and progress in treatment, Medication management and Plan 26 year old woman with a history of chronic mood and psychotic symptoms and substance abuse comes to the hospital complaining of hallucinations suicidal thoughts mood instability.  She is requesting admission.  Meets criteria for inpatient treatment.  Patient will be admitted to the psychiatric ward.  I have agreed to restart her fluoxetine.  Also she says she last got her Abilify injection a month or more ago so we will repeat that.   I have added a modest dose of Seroquel at night for mood stability and sleep.  She has already been started on metronidazole for her bacterial vaginosis and I have added clindamycin cream.  15-minute checks.  Engage in appropriate substance abuse and psychiatric treatment.  Disposition: Recommend psychiatric Inpatient admission when medically cleared. Supportive therapy provided about ongoing stressors.  Alethia Berthold, MD 12/25/2017 2:40 PM

## 2017-12-25 NOTE — ED Notes (Signed)
Report was received from Amy B., RN; Pt. Verbalizes  complaints of having auditory hallucinations; with distress of having a H/O Schizophrenia; and verbalizes having S.I.; denies having Hi. Continue to monitor with 15 min. Monitoring.

## 2017-12-25 NOTE — ED Notes (Signed)
Pt speaking with her mother on the phone. Pt remains calm. Maintained on 15 minute checks and observation by security camera for safety.

## 2017-12-25 NOTE — ED Notes (Signed)
Pt given phone to return mother's phone call. Pt is calm and cooperative. Maintained on 15 minute checks and observation by security camera for safety.

## 2017-12-25 NOTE — ED Notes (Signed)
Pt is hypotensive but denies lightheadedness and is asymptomatic. Fluids provided.

## 2017-12-25 NOTE — ED Notes (Signed)

## 2017-12-26 ENCOUNTER — Encounter: Payer: Self-pay | Admitting: Psychiatry

## 2017-12-26 DIAGNOSIS — F3164 Bipolar disorder, current episode mixed, severe, with psychotic features: Principal | ICD-10-CM

## 2017-12-26 LAB — LIPID PANEL
CHOLESTEROL: 100 mg/dL (ref 0–200)
HDL: 71 mg/dL (ref >40)
LDL Cholesterol: 25 mg/dL (ref 0–99)
Total CHOL/HDL Ratio: 1.4 RATIO
Triglycerides: 19 mg/dL (ref <150)
VLDL: 4 mg/dL (ref 0–40)

## 2017-12-26 LAB — URINE DRUG SCREEN, QUALITATIVE (ARMC ONLY)
AMPHETAMINES, UR SCREEN: NOT DETECTED
Barbiturates, Ur Screen: NOT DETECTED
Benzodiazepine, Ur Scrn: NOT DETECTED
COCAINE METABOLITE, UR ~~LOC~~: POSITIVE — AB
Cannabinoid 50 Ng, Ur ~~LOC~~: NOT DETECTED
MDMA (ECSTASY) UR SCREEN: NOT DETECTED
METHADONE SCREEN, URINE: NOT DETECTED
Opiate, Ur Screen: NOT DETECTED
Phencyclidine (PCP) Ur S: NOT DETECTED
TRICYCLIC, UR SCREEN: NOT DETECTED

## 2017-12-26 LAB — URINALYSIS, COMPLETE (UACMP) WITH MICROSCOPIC
BACTERIA UA: NONE SEEN
BILIRUBIN URINE: NEGATIVE
Glucose, UA: NEGATIVE mg/dL
KETONES UR: NEGATIVE mg/dL
NITRITE: NEGATIVE
PH: 5 (ref 5.0–8.0)
Protein, ur: NEGATIVE mg/dL
Specific Gravity, Urine: 1.02 (ref 1.005–1.030)

## 2017-12-26 LAB — TSH: TSH: 1.184 u[IU]/mL (ref 0.350–4.500)

## 2017-12-26 LAB — HEMOGLOBIN A1C
Hgb A1c MFr Bld: 5 % (ref 4.8–5.6)
MEAN PLASMA GLUCOSE: 96.8 mg/dL

## 2017-12-26 MED ORDER — ARIPIPRAZOLE ER 400 MG IM SRER
400.0000 mg | INTRAMUSCULAR | Status: DC
  Filled 2017-12-26: qty 400

## 2017-12-26 MED ORDER — ARIPIPRAZOLE 10 MG PO TABS
20.0000 mg | ORAL_TABLET | Freq: Every day | ORAL | Status: DC
  Administered 2017-12-26 – 2017-12-28 (×3): 20 mg via ORAL
  Filled 2017-12-26 (×3): qty 2

## 2017-12-26 MED ORDER — QUETIAPINE FUMARATE 100 MG PO TABS
100.0000 mg | ORAL_TABLET | Freq: Every day | ORAL | Status: DC
  Administered 2017-12-26 – 2017-12-27 (×2): 100 mg via ORAL
  Filled 2017-12-26 (×2): qty 1

## 2017-12-26 MED ORDER — VENLAFAXINE HCL ER 75 MG PO CP24
150.0000 mg | ORAL_CAPSULE | Freq: Every day | ORAL | Status: DC
  Administered 2017-12-27 – 2017-12-28 (×2): 150 mg via ORAL
  Filled 2017-12-26 (×2): qty 2

## 2017-12-26 MED ORDER — ARIPIPRAZOLE ER 400 MG IM SRER: 400.0000 mg | INTRAMUSCULAR | Status: DC

## 2017-12-26 NOTE — [facility] Group Notes (Signed)
LCSW Group Therapy Note  12/26/2017 1:15pm  Type of Therapy and Topic: Group Therapy: Holding on to Grudges   Participation Level: Did Not Attend   Description of Group:  In this group patients will be asked to explore and define a grudge. Patients will be guided to discuss their thoughts, feelings, and reasons as to why people have grudges. Patients will process the impact grudges have on daily life and identify thoughts and feelings related to holding grudges. Facilitator will challenge patients to identify ways to let go of grudges and the benefits this provides. Patients will be confronted to address why one struggles letting go of grudges. Lastly, patients will identify feelings and thoughts related to what life would look like without grudges. This group will be process-oriented, with patients participating in exploration of their own experiences, giving and receiving support, and processing challenge from other group members.  Therapeutic Goals:  1. Patient will identify specific grudges related to their personal life.  2. Patient will identify feelings, thoughts, and beliefs around grudges.  3. Patient will identify how one releases grudges appropriately.  4. Patient will identify situations where they could have let go of the grudge, but instead chose to hold on.   Summary of Patient Progress:   Therapeutic Modalities:  Cognitive Behavioral Therapy  Solution Focused Therapy  Motivational Interviewing  Brief Therapy   Cyndel Griffey  CUEBAS-COLON, LCSW 12/26/2017 10:20 AM  

## 2017-12-26 NOTE — Progress Notes (Signed)
Chaplain responded to a consult for pt in Rm 316. [place] met pt in the consult Rm. Pt spoke about her struggle with addiction. Pt said she gets depressed frequently. She also said she has been abusing drugs to high. Pt said she wasted $ 2250 dollars on drugs this month of December which has made her depressed. Pt regrets for her actions and would like to get help for addiction. Pt does not have a good support system besides her mother. [place] listen to pt and provided a ministry of presence and support. [facility] is available to follow up pt as needed.    12/26/17 1700  Clinical Encounter Type  Visited With Patient;Health care provider  Visit Type Initial;Other (Comment)  Referral From Nurse  Consult/Referral To Chaplain  Spiritual Encounters  Spiritual Needs Other (Comment)

## 2017-12-26 NOTE — Plan of Care (Signed)
[place] remains safe and therapeutic. Compliant with medication and meals. Has the ability to verbalize needs to staff.

## 2017-12-26 NOTE — H&P (Signed)
Psychiatric Admission Assessment Adult  Patient Identification: Bridget Carpenter [place]:  [id number] Date of Evaluation:  12/26/2017 Chief Complaint:  'Depression Principal Diagnosis: Bipolar I disorder, most recent episode mixed, severe with psychotic features (HCC) Diagnosis:   Patient Active Problem List   Diagnosis Date Noted  . Bipolar I disorder, most recent episode mixed, severe with psychotic features (HCC) [F31.64] 07/18/2017    Priority: High  . Bacterial vaginosis [N76.0, B96.89] 12/25/2017  . PTSD (post-traumatic stress disorder) [F43.10] 09/02/2017  . Bipolar disorder, curr episode mixed, severe, with psychotic features (HCC) [F31.64] 09/02/2017  . 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 Overdose [T50.901A] 07/16/2017  . Aspiration pneumonia (HCC) [J69.0] 07/16/2017  . Acute respiratory failure (HCC) [J96.00] 07/16/2017  . Polysubstance abuse (HCC) [F19.10] 07/16/2017  . Eczema [L30.9] 05/08/2017  . Borderline personality disorder (HCC) [F60.3] 11/06/2016  . Cocaine use disorder, severe, dependence (HCC) [F14.20] 11/06/2016  . Cannabis use disorder, moderate, dependence (HCC) [F12.20] 11/06/2016  . Alcohol use disorder, mild, abuse [F10.10] 11/06/2016  . Asthma [J45.909] 09/23/2011  . Tobacco use disorder [F17.200] 09/15/2009   History of Present Illness:   Identifying data. Bridget Carpenter is a 26 year old female with a history of bipolar disorder and substance abuse.  Chief complaint. "I hear demons."  History pf present illness. Information was obtained from the patient and the chart. The patient came to the ER complaining of auditory hallucinations. For the past two months she has been hearing voices of demons. There are no command hallucinations. Sometimes, she believes she can hear Bridget Carpenter cry. She has suicidal thoughts but did not act upon them. She has been off Bridget psychiatric medications even though claims to "give herself" injection of Abilify last month.  Today, she only agrees to take "fluoxetine". She reports many symptoms of depression with poor sleep, weight loss, feeling of guilt and hopelessness. In addition to hearing voices, she is paranoid. She has been using cocaine and cannabis.  Past psychiatric history. Long history of mental illness, hospitalizations, medication trials and suicide attempt. Bridget life is chaotic and she has been notoriously noncompliant with treatment. She follows up with [facility].   Family psychiatric history. Substance abuse.  Social history. She technically stays with Bridget mother but never for long. She has a Carpenter.   Total Time spent with patient: 1 hour  Is the patient at risk to self? Yes.    Has the patient been a risk to self in the past 6 months? No.  Has the patient been a risk to self within the distant past? Yes.    Is the patient a risk to others? No.  Has the patient been a risk to others in the past 6 months? No.  Has the patient been a risk to others within the distant past? No.   Prior Inpatient Therapy:   Prior Outpatient Therapy:    Alcohol Screening: Patient refused Alcohol Screening Tool: Yes 1. How often do you have a drink containing alcohol?: Never 2. How many drinks containing alcohol do you have on a typical day when you are drinking?: 1 or 2 3. How often do you have six or more drinks on one occasion?: Never AUDIT-C Score: 0 4. How often during the last year have you found that you were not able to stop drinking once you had started?: Never 5. How often during the last year have you failed to do what was normally expected from you becasue of drinking?: Never 6. How often during the last year have  you needed a first drink in the morning to get yourself going after a heavy drinking session?: Never 7. How often during the last year have you had a feeling of guilt of remorse after drinking?: Never 8. How often during the last year have you been unable to remember what happened the night  before because you had been drinking?: Never 9. Have you or someone else been injured as a result of your drinking?: No 10. Has a relative or friend or a doctor or another health worker been concerned about your drinking or suggested you cut down?: No Alcohol Use Disorder Identification Test Final Score (AUDIT): 0 Intervention/Follow-up: AUDIT Score <7 follow-up not indicated Substance Abuse History in the last 12 months:  Yes.   Consequences of Substance Abuse: Negative Previous Psychotropic Medications: Yes  Psychological Evaluations: No  Past Medical History:  Past Medical History:  Diagnosis Date  . Asthma   . Depression 2009   Inpatient psych admission for SI, dissociative fugue  . Dissociative disorder or reaction 2009  . Eczema   . H/O: suicide attempt   . ODD (oppositional defiant disorder)   . PTSD (post-traumatic stress disorder)   . Schizoaffective disorder (HCC)   . Substance abuse [facility])     Past Surgical History:  Procedure Laterality Date  . ADENOIDECTOMY    . TONSILLECTOMY     Family History:  Family History  Adopted: Yes  Problem Relation Age of Onset  . Mental illness Mother   . Mental illness Father     Tobacco Screening: Have you used any form of tobacco in the last 30 days? (Cigarettes, Smokeless Tobacco, Cigars, and/or Pipes): No Social History:  Social History   Substance and Sexual Activity  Alcohol Use Yes   Comment: sometimes     Social History   Substance and Sexual Activity  Drug Use No    Additional Social History:                           Allergies:   Allergies  Allergen Reactions  . Banana Hives   Lab Results:  Results for orders placed or performed during the hospital encounter of 12/25/17 (from the past 48 hour(s))  Hemoglobin A1c     Status: None   Collection Time: 12/26/17  7:04 AM  Result Value Ref Range   Hgb A1c MFr Bld 5.0 4.8 - 5.6 %    Comment: (NOTE) Pre diabetes:          5.7%-6.4% Diabetes:               >6.4% Glycemic control for   <7.0% adults with diabetes    Mean Plasma Glucose 96.8 mg/dL    Comment: Performed at [facility], [address] [place], [place] [address]  Lipid panel     Status: None   Collection Time: 12/26/17  7:04 AM  Result Value Ref Range   Cholesterol 100 0 - 200 mg/dL   Triglycerides 19 <119 mg/dL   HDL 71 >14 mg/dL   Total CHOL/HDL Ratio 1.4 RATIO   VLDL 4 0 - 40 mg/dL   LDL Cholesterol 25 0 - 99 mg/dL    Comment:        Total Cholesterol/HDL:CHD Risk Coronary Heart Disease Risk Table                     Men   Women  1/2 Average Risk  3.4   3.3  Average Risk       5.0   4.4  2 X Average Risk   9.6   7.1  3 X Average Risk  23.4   11.0        Use the calculated Patient Ratio above and the CHD Risk Table to determine the patient's CHD Risk.        ATP III CLASSIFICATION (LDL):  <100     mg/dL   Optimal  161-096  mg/dL   Near or Above                    Optimal  130-159  mg/dL   Borderline  045-409  mg/dL   High  >811     mg/dL   Very High Performed at [facility], [address] [place], [place] [address]   TSH     Status: None   Collection Time: 12/26/17  7:04 AM  Result Value Ref Range   TSH 1.184 0.350 - 4.500 uIU/mL    Comment: Performed by a 3rd Generation assay with a functional sensitivity of <=0.01 uIU/mL. Performed at [facility], [address] [place], [place] [address]   Urine Drug Screen, Qualitative     Status: Abnormal   Collection Time: 12/26/17  9:17 AM  Result Value Ref Range   Tricyclic, Ur Screen NONE DETECTED NONE DETECTED   Amphetamines, Ur Screen NONE DETECTED NONE DETECTED   MDMA (Ecstasy)Ur Screen NONE DETECTED NONE DETECTED   Cocaine Metabolite,Ur [place] POSITIVE (A) NONE DETECTED   Opiate, Ur Screen NONE DETECTED NONE DETECTED   Phencyclidine (PCP) Ur S NONE DETECTED NONE DETECTED   Cannabinoid 50 Ng, Ur [place] NONE DETECTED NONE DETECTED   Barbiturates, Ur Screen NONE DETECTED NONE  DETECTED   Benzodiazepine, Ur Scrn NONE DETECTED NONE DETECTED   Methadone Scn, Ur NONE DETECTED NONE DETECTED    Comment: (NOTE) Tricyclics + metabolites, urine    Cutoff 1000 ng/mL Amphetamines + metabolites, urine  Cutoff 1000 ng/mL MDMA (Ecstasy), urine              Cutoff 500 ng/mL Cocaine Metabolite, urine          Cutoff 300 ng/mL Opiate + metabolites, urine        Cutoff 300 ng/mL Phencyclidine (PCP), urine         Cutoff 25 ng/mL Cannabinoid, urine                 Cutoff 50 ng/mL Barbiturates + metabolites, urine  Cutoff 200 ng/mL Benzodiazepine, urine              Cutoff 200 ng/mL Methadone, urine                   Cutoff 300 ng/mL The urine drug screen provides only a preliminary, unconfirmed analytical test result and should not be used for non-medical purposes. Clinical consideration and professional judgment should be applied to any positive drug screen result due to possible interfering substances. A more specific alternate chemical method must be used in order to obtain a confirmed analytical result. Gas chromatography / mass spectrometry (GC/MS) is the preferred confirmat ory method. Performed at [facility], [address] [place], [place] [address]   Urinalysis, Complete w Microscopic     Status: Abnormal   Collection Time: 12/26/17  9:17 AM  Result Value Ref Range   Color, Urine YELLOW (A) YELLOW   APPearance CLEAR (A) CLEAR  Specific Gravity, Urine 1.020 1.005 - 1.030   pH 5.0 5.0 - 8.0   Glucose, UA NEGATIVE NEGATIVE mg/dL   Hgb urine dipstick MODERATE (A) NEGATIVE   Bilirubin Urine NEGATIVE NEGATIVE   Ketones, ur NEGATIVE NEGATIVE mg/dL   Protein, ur NEGATIVE NEGATIVE mg/dL   Nitrite NEGATIVE NEGATIVE   Leukocytes, UA TRACE (A) NEGATIVE   RBC / HPF 6-30 0 - 5 RBC/hpf   WBC, UA 0-5 0 - 5 WBC/hpf   Bacteria, UA NONE SEEN NONE SEEN   Squamous Epithelial / LPF 0-5 (A) NONE SEEN    Comment: Performed at [facility], [address] [place], [place] [address]    Blood Alcohol level:  Lab Results  Component Value Date   [facility] 12/[facility]2017   ETH <5 09/01/2017    Metabolic Disorder Labs:  Lab Results  Component Value Date   HGBA1C 5.0 12/26/2017   MPG 96.8 12/26/2017   MPG 94 07/19/2017   Lab Results  Component Value Date   PROLACTIN 21.5 11/06/2016   PROLACTIN 49.4 (H) 08/13/2016   Lab Results  Component Value Date   CHOL 100 12/26/2017   TRIG 19 12/26/2017   HDL 71 12/26/2017   CHOLHDL 1.4 12/26/2017   VLDL 4 12/26/2017   LDLCALC 25 12/26/2017   LDLCALC 54 07/19/2017    Current Medications: Current Facility-Administered Medications  Medication Dose Route Frequency Provider Last Rate Last Dose  . acetaminophen (TYLENOL) tablet 650 mg  650 mg Oral Q6H PRN Clapacs, Jackquline DenmarkJohn T, MD   650 mg at 12/26/17 0829  . alum & mag hydroxide-simeth (MAALOX/MYLANTA) 200-200-20 MG/5ML suspension 30 mL  30 mL Oral Q4H PRN Clapacs, John T, MD      . ARIPiprazole ER SRER 400 mg  400 mg Intramuscular Once Clapacs, John T, MD      . clindamycin (CLEOCIN) 2 % vaginal cream 1 Applicatorful  1 Applicatorful Vaginal QHS Clapacs, John T, MD      . FLUoxetine (PROZAC) capsule 20 mg  20 mg Oral Daily Clapacs, John T, MD   20 mg at 12/26/17 [id number]  . hydrOXYzine (ATARAX/VISTARIL) tablet 50 mg  50 mg Oral TID PRN Clapacs, John T, MD      . magnesium hydroxide (MILK OF MAGNESIA) suspension 30 mL  30 mL Oral Daily PRN Clapacs, John T, MD      . metroNIDAZOLE (FLAGYL) tablet 500 mg  500 mg Oral Q12H Clapacs, Jackquline DenmarkJohn T, MD   500 mg at 12/26/17 0829  . QUEtiapine (SEROQUEL) tablet 50 mg  50 mg Oral QHS Clapacs, John T, MD      . traZODone (DESYREL) tablet 100 mg  100 mg Oral QHS PRN Clapacs, Jackquline DenmarkJohn T, MD       PTA Medications: Medications Prior to Admission  Medication Sig Dispense Refill Last Dose  . ARIPiprazole (ABILIFY) 10 MG tablet Take 10 mg by mouth at bedtime.   11/11/2017  . ARIPiprazole (ABILIFY) 15 MG tablet Take 0.5 tablets  (7.5 mg total) by mouth at bedtime. For mood control (Patient not taking: Reported on 12/24/2017) 21 tablet 0 Not Taking  . ARIPiprazole ER 400 MG SRER Inject 400 mg into the muscle every 30 (thirty) days. (Dur on 10-02-17): For mood control (Patient taking differently: Inject 400 mg into the muscle every 30 (thirty) days. Last filled on 11/11/2017) 1 each 0 11/11/2017  . benztropine (COGENTIN) 0.5 MG tablet Take 1 tablet (0.5 mg total) by mouth daily as needed for tremors (EPS). (  Patient not taking: Reported on 12/24/2017) 18 tablet 0 Not Taking at Unknown time  . benztropine (COGENTIN) 1 MG tablet Take 1 mg by mouth 2 (two) times daily as needed for tremors.     Marland Kitchen buPROPion (WELLBUTRIN) 75 MG tablet Take 1 tablet (75 mg total) by mouth every morning. For depression (Patient not taking: Reported on 12/24/2017) 30 tablet 0 Not Taking at Unknown time  . gabapentin (NEURONTIN) 100 MG capsule Take 1 capsule (100 mg total) by mouth 3 (three) times daily at 8am, 3pm and bedtime. (Patient not taking: Reported on 12/24/2017) 90 capsule 0 Not Taking at Unknown time  . hydrOXYzine (ATARAX/VISTARIL) 50 MG tablet Take 1 tablet (50 mg total) by mouth 3 (three) times daily as needed for anxiety. (Patient not taking: Reported on 12/24/2017) 60 tablet 0 Not Taking at Unknown time  . traZODone (DESYREL) 100 MG tablet Take 1 tablet (100 mg total) by mouth at bedtime as needed for sleep. (Patient not taking: Reported on 12/24/2017) 30 tablet 0 Not Taking at Unknown time  . Triamcinolone Acetonide (TRIAMCINOLONE 0.1 % CREAM : EUCERIN) CREA Apply 1 application topically 2 (two) times daily. For Eczema       Musculoskeletal: Strength & Muscle Tone: within normal limits Gait & Station: normal Patient leans: N/A  Psychiatric Specialty Exam: Physical Exam  Nursing note and vitals reviewed. Constitutional: She is oriented to person, place, and time. She appears well-developed and well-nourished.  HENT:  Head:  Normocephalic and atraumatic.  Eyes: Conjunctivae and EOM are normal. Pupils are equal, round, and reactive to light.  Neck: Normal range of motion. Neck supple.  Cardiovascular: Normal rate, regular rhythm and normal heart sounds.  Respiratory: Effort normal and breath sounds normal.  GI: Soft. Bowel sounds are normal.  Musculoskeletal: Normal range of motion.  Neurological: She is alert and oriented to person, place, and time.  Skin: Skin is warm and dry.  Psychiatric: Bridget speech is normal. Bridget affect is blunt. She is withdrawn and actively hallucinating. Thought content is paranoid. Cognition and memory are normal. She expresses impulsivity. She exhibits a depressed mood. She expresses suicidal ideation.    Review of Systems  Neurological: Negative.   Psychiatric/Behavioral: Positive for depression, hallucinations, substance abuse and suicidal ideas. The patient has insomnia.   All other systems reviewed and are negative.   Blood pressure (!) 100/56, pulse 81, temperature 98 F (36.7 C), temperature source Oral, resp. rate 18, height 5\' 7"  (1.702 m), weight 60.3 kg (133 lb).Body mass index is 20.83 kg/m.  See SRA                                                  Sleep:       Treatment Plan Summary: Daily contact with patient to assess and evaluate symptoms and progress in treatment and Medication management   Ms. Raneri is a 26 year old female with a history of bipolar disorder admitted for psychotic break in the context of treatment noncompliance.  #Mood and psychosis -Abilify 400 mg every 28 days, patient refuses -Abilify 20 mg daily -fluoxetine 20 mg daily  #Insomnia -Seroquel 50 mg nightly  #Substance abuse  #Metabolic syndrome monitoring -Lipid panel, TSH and HgbA1C pending -EKG, pending  #Bacterial vaginosis -Metronidazol  -pregnancy test is negative  #Disposition -discharge with the mother -follow up with [facility]   Observation  Level/Precautions:  15 minute checks  Laboratory:  CBC Chemistry Profile UDS UA  Psychotherapy:    Medications:    Consultations:    Discharge Concerns:    Estimated LOS:  Other:     Physician Treatment Plan for Primary Diagnosis: Bipolar I disorder, most recent episode mixed, severe with psychotic features (HCC) Long Term Goal(s): Improvement in symptoms so as ready for discharge  Short Term Goals: Ability to identify changes in lifestyle to reduce recurrence of condition will improve, Ability to verbalize feelings will improve, Ability to disclose and discuss suicidal ideas, Ability to demonstrate self-control will improve, Ability to identify and develop effective coping behaviors will improve, Ability to maintain clinical measurements within normal limits will improve, Compliance with prescribed medications will improve and Ability to identify triggers associated with substance abuse/mental health issues will improve  Physician Treatment Plan for Secondary Diagnosis: Principal Problem:   Bipolar I disorder, most recent episode mixed, severe with psychotic features (HCC) Active Problems:   Tobacco use disorder   Bacterial vaginosis  Long Term Goal(s): Improvement in symptoms so as ready for discharge  Short Term Goals: Ability to identify changes in lifestyle to reduce recurrence of condition will improve, Ability to demonstrate self-control will improve and Ability to identify triggers associated with substance abuse/mental health issues will improve  I certify that inpatient services furnished can reasonably be expected to improve the patient's condition.    Kristine Linea, MD 12/29/20182:28 PM

## 2017-12-26 NOTE — [facility] Group Notes (Signed)
[facility] Group Notes:  (Nursing/MHT/Case Management/Adjunct)  Date:  12/26/2017  Time:  9:59 PM  Type of Therapy:  Group Therapy  Participation Level:  Active  Participation Quality:  Appropriate  Affect:  Appropriate  Cognitive:  Alert  Insight:  Good  Engagement in Group:  Engaged  Modes of Intervention:  Support  Summary of Progress/Problems:  Bridget Carpenter 12/26/2017, 9:59 PM

## 2017-12-26 NOTE — [facility] Suicide Risk Assessment (Signed)
[facility]   Nursing information obtained from:    Demographic factors:    Current Mental Status:    Loss Factors:    Historical Factors:    Risk Reduction Factors:     Total Time spent with patient: 1 hour Principal Problem: Bipolar I disorder, most recent episode mixed, severe with psychotic features [facility](HCC) Diagnosis:   Patient Active Problem List   Diagnosis Date Noted  . Bipolar I disorder, most recent episode mixed, severe with psychotic features (HCC) [F31.64] 07/18/2017    Priority: High  . Bacterial vaginosis [N76.0, B96.89] 12/25/2017  . PTSD (post-traumatic stress disorder) [F43.10] 09/02/2017  . Bipolar disorder, curr episode mixed, severe, with psychotic features (HCC) [F31.64] 09/02/2017  . 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 Overdose [T50.901A] 07/16/2017  . Aspiration pneumonia (HCC) [J69.0] 07/16/2017  . Acute respiratory failure (HCC) [J96.00] 07/16/2017  . Polysubstance abuse (HCC) [F19.10] 07/16/2017  . Eczema [L30.9] 05/08/2017  . Borderline personality disorder (HCC) [F60.3] 11/06/2016  . Cocaine use disorder, severe, dependence (HCC) [F14.20] 11/06/2016  . Cannabis use disorder, moderate, dependence (HCC) [F12.20] 11/06/2016  . Alcohol use disorder, mild, abuse [F10.10] 11/06/2016  . Asthma [J45.909] 09/23/2011  . Tobacco use disorder [F17.200] 09/15/2009   Subjective Data: psychotic break  Continued Clinical Symptoms:  Alcohol Use Disorder Identification Test Final Score (AUDIT): 0 The "Alcohol Use Disorders Identification Test", Guidelines for Use in Primary Care, Second Edition.  World Science writerHealth Organization [facility](WHO). Score between 0-7:  no or low risk or alcohol related problems. Score between 8-15:  moderate risk of alcohol related problems. Score between 16-19:  high risk of alcohol related problems. Score 20 or above:  warrants further diagnostic evaluation for alcohol dependence and treatment.   CLINICAL FACTORS:   Schizophrenia:   Depressive state Less than 26 years old   Musculoskeletal: Strength & Muscle Tone: within normal limits Gait & Station: normal Patient leans: N/A  Psychiatric Specialty Exam: Physical Exam  Nursing note and vitals reviewed. Psychiatric: Her speech is normal. Her affect is blunt. She is withdrawn and actively hallucinating. Thought content is paranoid. Cognition and memory are normal. She expresses impulsivity. She exhibits a depressed mood. She expresses suicidal ideation.    Review of Systems  Neurological: Negative.   Psychiatric/Behavioral: Positive for depression, hallucinations, substance abuse and suicidal ideas. The patient has insomnia.   All other systems reviewed and are negative.   Blood pressure (!) 100/56, pulse 81, temperature 98 F (36.7 C), temperature source Oral, resp. rate 18, height 5\' 7"  (1.702 m), weight 60.3 kg (133 lb).Body mass index is 20.83 kg/m.  General Appearance: Disheveled  Eye Contact:  Minimal  Speech:  Clear and Coherent  Volume:  Decreased  Mood:  Dysphoric  Affect:  Blunt  Thought Process:  Goal Directed and Descriptions of Associations: Intact  Orientation:  Full (Time, Place, and Person)  Thought Content:  Hallucinations: Auditory  Suicidal Thoughts:  Yes.  with intent/plan  Homicidal Thoughts:  No  Memory:  Immediate;   Fair Recent;   Fair Remote;   Fair  Judgement:  Poor  Insight:  Lacking  Psychomotor Activity:  Psychomotor Retardation  Concentration:  Concentration: Fair and Attention Span: Fair  Recall:  [employer]Fair  Fund of Knowledge:  Fair  Language:  Fair  Akathisia:  No  Handed:  Right  AIMS (if indicated):     Assets:  Communication Skills Desire for Improvement Financial Resources/Insurance Physical Health Resilience Social Support  ADL's:  Intact  Cognition:  WNL  Sleep:         COGNITIVE FEATURES THAT CONTRIBUTE TO RISK:  None    SUICIDE RISK:   Moderate:  Frequent suicidal ideation with  limited intensity, and duration, some specificity in terms of plans, no associated intent, good self-control, limited dysphoria/symptomatology, some risk factors present, and identifiable protective factors, including available and accessible social support.  PLAN OF CARE: Hospital admission, medication management, substance abuse counseling, discharge planning.  Bridget Carpenter is a 26 year old female with a history of bipolar disorder admitted for psychotic break in the context of treatment noncompliance.  #Mood and psychosis -Abilify 400 mg every 28 days, patient refuses -Abilify 20 mg daily -fluoxetine 20 mg daily  #Insomnia -Seroquel 50 mg nightly  #Substance abuse  #Metabolic syndrome monitoring -Lipid panel, TSH and HgbA1C pending -EKG, pending  #Bacterial vaginosis -Metronidazol  -pregnancy test is negative  #Disposition -discharge with the mother -follow up with [facility]  I certify that inpatient services furnished can reasonably be expected to improve the patient's condition.   Kristine LineaJolanta Shakayla Hickox, MD 12/26/2017, 2:22 PM

## 2017-12-26 NOTE — Progress Notes (Signed)
Abilify injection 400 mg not administered by this RN. Injection was given by Vic RipperAmy B., RN on 12/25/17. Next next is due in 28 days per current order.

## 2017-12-27 MED ORDER — METRONIDAZOLE 500 MG PO TABS: 500.0000 mg | ORAL_TABLET | Freq: Two times a day (BID) | ORAL | 0 refills | Status: DC

## 2017-12-27 MED ORDER — ARIPIPRAZOLE 20 MG PO TABS: 20.0000 mg | ORAL_TABLET | Freq: Every day | ORAL | 1 refills | Status: DC

## 2017-12-27 MED ORDER — QUETIAPINE FUMARATE 100 MG PO TABS: 100.0000 mg | ORAL_TABLET | Freq: Every day | ORAL | 1 refills | Status: DC

## 2017-12-27 MED ORDER — ARIPIPRAZOLE ER 400 MG IM SRER: 400.0000 mg | INTRAMUSCULAR | 1 refills | Status: DC

## 2017-12-27 MED ORDER — VENLAFAXINE HCL ER 150 MG PO CP24: 150.0000 mg | ORAL_CAPSULE | Freq: Every day | ORAL | 1 refills | Status: DC
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
## 2017-12-27 NOTE — Plan of Care (Signed)
Pt. Reports she is able to remain safe while on the unit. Pt verbally contracts for safety. Pt verbalizes an understanding of provided education.  

## 2017-12-27 NOTE — Progress Notes (Signed)
D:Pt denies SI/HI/AVH. Pt is cooperative with assessment. Pt.Complains of anxiety and depression at a 7 and 8 respectively.Patient Interaction is engaging and upon presentation she seems slightly anxious/depressed. Pt reports eating and sleeping, "good". Pt reports snacking too much throughout the day, making meal intake percentages off. Pt reports feeling, "a little better" today then she did yesterday. Pt verbally contracts for safety.     A: Q x 15 minute observation checks were completed for safety. Patient was provided with education. Patient was given scheduled medications. Patient was encourage to attend groups, participate in unit activities and continue with plan of care.   R:Patient is complaint with medication and unit procedures, attends groups.             Patient slept for Estimated Hours of 6.45; Precautionary checks every 15 minutes for safety maintained, room free of safety hazards, patient sustains no injury or falls during this shift.

## 2017-12-27 NOTE — Progress Notes (Signed)
Patient pleasant and cooperative. No psychotic episodes noted. Denies SI, HI, AVH. Medication and group compliant. Appropriate with staff and peers. Denies SI, HI, AVH.  Encouragement and support offered. Safety checks maintained. Medications given as prescribed. Pt receptive and remains safe on unit with q 15 min checks.

## 2017-12-27 NOTE — [facility] Group Notes (Signed)
LCSW Group Therapy Note 12/27/2017 1:15pm Type of Therapy and Topic: Group Therapy: Feelings Around Returning Home & Establishing a Supportive Framework and Supporting Oneself When Supports Not Available Participation Level: Did Not Attend Description of Group:  Patients first processed thoughts and feelings about upcoming discharge. These included fears of upcoming changes, lack of change, new living environments, judgements and expectations from others and overall stigma of mental health issues. The group then discussed the definition of a supportive framework, what that looks and feels like, and how do to discern it from an unhealthy non-supportive network. The group identified different types of supports as well as what to do when your family/friends are less than helpful or unavailable  Therapeutic Goals  1. Patient will identify one healthy supportive network that they can use at discharge. 2. Patient will identify one factor of a supportive framework and how to tell it from an unhealthy network. 3. Patient able to identify one coping skill to use when they do not have positive supports from others. 4. Patient will demonstrate ability to communicate their needs through discussion and/or role plays.  Summary of Patient Progress:     Therapeutic Modalities Cognitive Behavioral Therapy Motivational Interviewing   Bridget Carpenter  CUEBAS-COLON, LCSW 12/27/2017 9:28 AM

## 2017-12-27 NOTE — Progress Notes (Signed)
[facility] MD Progress Note  12/27/2017 11:03 AM [name]  MRN:  [id number]  Subjective:   Ms. Orr has no complaints today. She denies any symptoms of depression, anxiety or psychosis. She is pleasant and cooperative. She took all her medications including injectable Abilify and reports no side effects. Sleep and appetite are good.   Treatment plan. We restarted Abilify maintena 400 mg monthly injections, oral Abilify 10 mg daily and substituted Prozac with Effexor.  Social/disposition. The patient will be discharged with her mother. Follow up with [facility].  Principal Problem: Bipolar I disorder, most recent episode mixed, severe with psychotic features (HCC) Diagnosis:   Patient Active Problem List   Diagnosis Date Noted  . Bipolar I disorder, most recent episode mixed, severe with psychotic features (HCC) [F31.64] 07/18/2017    Priority: High  . Bacterial vaginosis [N76.0, B96.89] 12/25/2017  . PTSD (post-traumatic stress disorder) [F43.10] 09/02/2017  . Bipolar disorder, curr episode mixed, severe, with psychotic features (HCC) [F31.64] 09/02/2017  . 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 Overdose [T50.901A] 07/16/2017  . Aspiration pneumonia (HCC) [J69.0] 07/16/2017  . Acute respiratory failure (HCC) [J96.00] 07/16/2017  . Polysubstance abuse (HCC) [F19.10] 07/16/2017  . Eczema [L30.9] 05/08/2017  . Borderline personality disorder (HCC) [F60.3] 11/06/2016  . Cocaine use disorder, severe, dependence (HCC) [F14.20] 11/06/2016  . Cannabis use disorder, moderate, dependence (HCC) [F12.20] 11/06/2016  . Alcohol use disorder, mild, abuse [F10.10] 11/06/2016  . Asthma [J45.909] 09/23/2011  . Tobacco use disorder [F17.200] 09/15/2009   Total Time spent with patient: 20 minutes  Past Psychiatric History: schizoaffective disorder  Past Medical History:  Past Medical History:  Diagnosis Date  . Asthma   . Depression 2009   Inpatient psych admission for SI, dissociative fugue   . Dissociative disorder or reaction 2009  . Eczema   . H/O: suicide attempt   . ODD (oppositional defiant disorder)   . PTSD (post-traumatic stress disorder)   . Schizoaffective disorder (HCC)   . Substance abuse [facility])     Past Surgical History:  Procedure Laterality Date  . ADENOIDECTOMY    . TONSILLECTOMY     Family History:  Family History  Adopted: Yes  Problem Relation Age of Onset  . Mental illness Mother   . Mental illness Father    Family Psychiatric  History: substance abuse Social History:  Social History   Substance and Sexual Activity  Alcohol Use Yes   Comment: sometimes     Social History   Substance and Sexual Activity  Drug Use No    Social History   Socioeconomic History  . Marital status: Single    Spouse name: None  . Number of children: None  . Years of education: None  . Highest education level: None  Social Needs  . Financial resource strain: None  . Food insecurity - worry: None  . Food insecurity - inability: None  . Transportation needs - medical: None  . Transportation needs - non-medical: None  Occupational History  . None  Tobacco Use  . Smoking status: Former Smoker    Packs/day: 0.50    Years: 2.00    Pack years: 1.00    Types: Cigarettes  . Smokeless tobacco: Never Used  Substance and Sexual Activity  . Alcohol use: Yes    Comment: sometimes  . Drug use: No  . Sexual activity: Yes  Other Topics Concern  . None  Social History Narrative  . None   Additional Social History:  Sleep: Fair  Appetite:  Fair  Current Medications: Current Facility-Administered Medications  Medication Dose Route Frequency Provider Last Rate Last Dose  . acetaminophen (TYLENOL) tablet 650 mg  650 mg Oral Q6H PRN Clapacs, [name], MD   650 mg at 12/26/17 0829  . alum & mag hydroxide-simeth (MAALOX/MYLANTA) 200-200-20 MG/5ML suspension 30 mL  30 mL Oral Q4H PRN Clapacs, John T, MD      . ARIPiprazole  (ABILIFY) tablet 20 mg  20 mg Oral Daily Onell Mcmath B, MD   20 mg at 12/27/17 0753  . [START ON 01/22/2018] ARIPiprazole ER SRER 400 mg  400 mg Intramuscular Q28 days Copper Kirtley B, MD      . clindamycin (CLEOCIN) 2 % vaginal cream 1 Applicatorful  1 Applicatorful Vaginal QHS Clapacs, John T, MD      . hydrOXYzine (ATARAX/VISTARIL) tablet 50 mg  50 mg Oral TID PRN Clapacs, John T, MD      . magnesium hydroxide (MILK OF MAGNESIA) suspension 30 mL  30 mL Oral Daily PRN Clapacs, John T, MD      . metroNIDAZOLE (FLAGYL) tablet 500 mg  500 mg Oral Q12H Clapacs, [name], MD   500 mg at 12/27/17 0753  . QUEtiapine (SEROQUEL) tablet 100 mg  100 mg Oral QHS Wende Longstreth B, MD   100 mg at 12/26/17 2103  . traZODone (DESYREL) tablet 100 mg  100 mg Oral QHS PRN Clapacs, John T, MD      . venlafaxine XR (EFFEXOR-XR) 24 hr capsule 150 mg  150 mg Oral Q breakfast Osric Klopf B, MD   150 mg at 12/27/17 1610    Lab Results:  Results for orders placed or performed during the hospital encounter of 12/25/17 (from the past 48 hour(s))  Hemoglobin A1c     Status: None   Collection Time: 12/26/17  7:04 AM  Result Value Ref Range   Hgb A1c MFr Bld 5.0 4.8 - 5.6 %    Comment: (NOTE) Pre diabetes:          5.7%-6.4% Diabetes:              >6.4% Glycemic control for   <7.0% adults with diabetes    Mean Plasma Glucose 96.8 mg/dL    Comment: Performed at [facility], [address], [place], [place] [address]  Lipid panel     Status: None   Collection Time: 12/26/17  7:04 AM  Result Value Ref Range   Cholesterol 100 0 - 200 mg/dL   Triglycerides 19 <409 mg/dL   HDL 71 >81 mg/dL   Total CHOL/HDL Ratio 1.4 RATIO   VLDL 4 0 - 40 mg/dL   LDL Cholesterol 25 0 - 99 mg/dL    Comment:        Total Cholesterol/HDL:CHD Risk Coronary Heart Disease Risk Table                     Men   Women  1/2 Average Risk   3.4   3.3  Average Risk       5.0   4.4  2 X Average Risk   9.6   7.1   3 X Average Risk  23.4   11.0        Use the calculated Patient Ratio above and the CHD Risk Table to determine the patient's CHD Risk.        ATP III CLASSIFICATION (LDL):  <100     mg/dL  Optimal  100-129  mg/dL   Near or Above                    Optimal  130-159  mg/dL   Borderline  409-811  mg/dL   High  >914     mg/dL   Very High Performed at [facility], [address] [place], [place] [address]   TSH     Status: None   Collection Time: 12/26/17  7:04 AM  Result Value Ref Range   TSH 1.184 0.350 - 4.500 uIU/mL    Comment: Performed by a 3rd Generation assay with a functional sensitivity of <=0.01 uIU/mL. Performed at [facility], [address] [place], [place] [address]   Urine Drug Screen, Qualitative     Status: Abnormal   Collection Time: 12/26/17  9:17 AM  Result Value Ref Range   Tricyclic, Ur Screen NONE DETECTED NONE DETECTED   Amphetamines, Ur Screen NONE DETECTED NONE DETECTED   MDMA (Ecstasy)Ur Screen NONE DETECTED NONE DETECTED   Cocaine Metabolite,Ur [place] POSITIVE (A) NONE DETECTED   Opiate, Ur Screen NONE DETECTED NONE DETECTED   Phencyclidine (PCP) Ur S NONE DETECTED NONE DETECTED   Cannabinoid 50 Ng, Ur [place] NONE DETECTED NONE DETECTED   Barbiturates, Ur Screen NONE DETECTED NONE DETECTED   Benzodiazepine, Ur Scrn NONE DETECTED NONE DETECTED   Methadone Scn, Ur NONE DETECTED NONE DETECTED    Comment: (NOTE) Tricyclics + metabolites, urine    Cutoff 1000 ng/mL Amphetamines + metabolites, urine  Cutoff 1000 ng/mL MDMA (Ecstasy), urine              Cutoff 500 ng/mL Cocaine Metabolite, urine          Cutoff 300 ng/mL Opiate + metabolites, urine        Cutoff 300 ng/mL Phencyclidine (PCP), urine         Cutoff 25 ng/mL Cannabinoid, urine                 Cutoff 50 ng/mL Barbiturates + metabolites, urine  Cutoff 200 ng/mL Benzodiazepine, urine              Cutoff 200 ng/mL Methadone, urine                   Cutoff 300 ng/mL The urine  drug screen provides only a preliminary, unconfirmed analytical test result and should not be used for non-medical purposes. Clinical consideration and professional judgment should be applied to any positive drug screen result due to possible interfering substances. A more specific alternate chemical method must be used in order to obtain a confirmed analytical result. Gas chromatography / mass spectrometry (GC/MS) is the preferred confirmat ory method. Performed at [facility], [address] [place], [place] [address]   Urinalysis, Complete w Microscopic     Status: Abnormal   Collection Time: 12/26/17  9:17 AM  Result Value Ref Range   Color, Urine YELLOW (A) YELLOW   APPearance CLEAR (A) CLEAR   Specific Gravity, Urine 1.020 1.005 - 1.030   pH 5.0 5.0 - 8.0   Glucose, UA NEGATIVE NEGATIVE mg/dL   Hgb urine dipstick MODERATE (A) NEGATIVE   Bilirubin Urine NEGATIVE NEGATIVE   Ketones, ur NEGATIVE NEGATIVE mg/dL   Protein, ur NEGATIVE NEGATIVE mg/dL   Nitrite NEGATIVE NEGATIVE   Leukocytes, UA TRACE (A) NEGATIVE   RBC / HPF 6-30 0 - 5 RBC/hpf   WBC, UA 0-5 0 - 5 WBC/hpf  Bacteria, UA NONE SEEN NONE SEEN   Squamous Epithelial / LPF 0-5 (A) NONE SEEN    Comment: Performed at [facility], [address] [place], [place] [id number]    Blood Alcohol level:  Lab Results  Component Value Date   [facility] LLC[facility] <10 12/24/2017   ETH <5 09/01/2017    Metabolic Disorder Labs: Lab Results  Component Value Date   HGBA1C 5.0 12/26/2017   MPG 96.8 12/26/2017   MPG 94 07/19/2017   Lab Results  Component Value Date   PROLACTIN 21.5 11/06/2016   PROLACTIN 49.4 (H) 08/13/2016   Lab Results  Component Value Date   CHOL 100 12/26/2017   TRIG 19 12/26/2017   HDL 71 12/26/2017   CHOLHDL 1.4 12/26/2017   VLDL 4 12/26/2017   LDLCALC 25 12/26/2017   LDLCALC 54 07/19/2017    Physical Findings: AIMS: Facial and Oral Movements Muscles of Facial Expression: None,  normal Lips and Perioral Area: None, normal Jaw: None, normal Tongue: None, normal,Extremity Movements Upper (arms, wrists, hands, fingers): None, normal Lower (legs, knees, ankles, toes): None, normal, Trunk Movements Neck, shoulders, hips: None, normal, Overall Severity Severity of abnormal movements (highest score from questions above): None, normal Incapacitation due to abnormal movements: None, normal Patient's awareness of abnormal movements (rate only patient's report): No Awareness, Dental Status Current problems with teeth and/or dentures?: No Does patient usually wear dentures?: No  CIWA:    COWS:     Musculoskeletal: Strength & Muscle Tone: within normal limits Gait & Station: normal Patient leans: N/A  Psychiatric Specialty Exam: Physical Exam  Nursing note and vitals reviewed. Psychiatric: She has a normal mood and affect. Her speech is normal and behavior is normal. Thought content normal. Cognition and memory are normal. She expresses impulsivity.    Review of Systems  Neurological: Negative.   Psychiatric/Behavioral: Negative.   All other systems reviewed and are negative.   Blood pressure (!) 90/51, pulse 73, temperature 98 F (36.7 C), temperature source Oral, resp. rate 16, height 5\' 7"  (1.702 m), weight 60.3 kg (133 lb), SpO2 100 %.Body mass index is 20.83 kg/m.  General Appearance: Casual  Eye Contact:  Good  Speech:  Clear and Coherent  Volume:  Normal  Mood:  Euthymic  Affect:  Appropriate  Thought Process:  Goal Directed and Descriptions of Associations: Intact  Orientation:  Full (Time, Place, and Person)  Thought Content:  WDL  Suicidal Thoughts:  No  Homicidal Thoughts:  No  Memory:  Immediate;   Fair Recent;   Fair Remote;   Fair  Judgement:  Poor  Insight:  Lacking  Psychomotor Activity:  Normal  Concentration:  Concentration: Fair and Attention Span: Fair  Recall:  FiservFair  Fund of Knowledge:  Fair  Language:  Fair  Akathisia:  No   Handed:  Right  AIMS (if indicated):     Assets:  Communication Skills Desire for Improvement Financial Resources/Insurance Housing Physical Health Resilience Social Support  ADL's:  Intact  Cognition:  WNL  Sleep:        Treatment Plan Summary: Daily contact with patient to assess and evaluate symptoms and progress in treatment and Medication management   [name] is a 26 year old female with a history of bipolar disorder admitted for psychotic break in the context of treatment noncompliance.  #Mood and psychosis, improving -Abilify 400 mg every 28 days, next dose on 01/21/2017 -Abilify 20 mg daily -discontinue Prozac -start Effexor 150 mg daily  #Insomnia, reports good sleep but  no record -Seroquel 50 mg nightly  #Substance abuse -minimizes problems and declines treatment -no symptoms of withdrawal  #Metabolic syndrome monitoring -Lipid panel, TSH and HgbA1C are normal -EKG, pending  #Bacterial vaginosis -Metronidazol  -pregnancy test is negative  #Disposition -discharge with the mother -follow up with [facility]    Kristine LineaJolanta Alianys Chacko, MD 12/27/2017, 11:03 AM

## 2017-12-27 NOTE — Progress Notes (Signed)
D:Pt denies SI/HI/AVH. Pt is cooperative with assessment. Pt. Complains of anxiety and depression at a 6 and 9 respectively.  Patient Interaction is engaging and upon presentation she seems slightly anxious/depressed. Pt reports eating and sleeping, "good". Pt reports feeling, "a little better" today then she did yesterday. Pt verbally contracts for safety.     A: Q x 15 minute observation checks were completed for safety. Patient was provided with education. Patient was given scheduled medications. Patient  was encourage to attend groups, participate in unit activities and continue with plan of care.   R:Patient is complaint with medication and unit procedures, attends groups.             Patient slept for Estimated Hours of 7.30; Precautionary checks every 15 minutes for safety maintained, room free of safety hazards, patient sustains no injury or falls during this shift.

## 2017-12-27 NOTE — BHH Suicide Risk Assessment (Signed)
B[facility] INPATIENT:  Family/Significant Other Suicide Prevention Education  Suicide Prevention Education:  Patient Refusal for Family/Significant Other Suicide Prevention Education: The patient [name] Karie KirksShayan Holsomback has refused to provide written consent for family/significant other to be provided Family/Significant Other Suicide Prevention Education during admission and/or prior to discharge.  Physician notified.  Bronsen Serano  CUEBAS-COLON 12/27/2017, 5:14 PM

## 2017-12-27 NOTE — BHH Group Notes (Signed)
[facility] Group Notes:  (Nursing/MHT/Case Management/Adjunct)  Date:  12/27/2017  Time:  9:32 PM  Type of Therapy:  Group Therapy  Participation Level:  Active  Participation Quality:  Appropriate  Affect:  Appropriate  Cognitive:  Appropriate  Insight:  Appropriate  Engagement in Group:  Engaged  Modes of Intervention:  Discussion  Summary of Progress/Problems:  Bridget Carpenter 12/27/2017, 9:32 PM

## 2017-12-27 NOTE — Plan of Care (Signed)
Pt. Reports she is able to remain safe while on the unit. Pt verbally contracts for safety. Pt verbalizes an understanding of provided education.

## 2017-12-27 NOTE — Plan of Care (Signed)
  Progressing Safety: Ability to remain free from injury will improve 12/27/2017 1736 - Progressing by Weldon PickingJones, Angelino Rumery E, RN Self-Concept: Ability to verbalize positive feelings about self will improve 12/27/2017 1736 - Progressing by Weldon PickingJones, Nile Dorning E, RN Level of anxiety will decrease 12/27/2017 1736 - Progressing by Weldon PickingJones, Kadynce Bonds E, RN Self-Concept: Ability to disclose and discuss suicidal ideas will improve 12/27/2017 1736 - Progressing by Weldon PickingJones, Amara Justen E, RN Ability to verbalize positive feelings about self will improve 12/27/2017 1736 - Progressing by Weldon PickingJones, Shrika Milos E, RN Education: Knowledge of [facility] Education information/materials will improve 12/27/2017 1736 - Progressing by Weldon PickingJones, Vrinda Heckstall E, RN Emotional status will improve 12/27/2017 1736 - Progressing by Weldon PickingJones, Latashia Koch E, RN Mental status will improve 12/27/2017 1736 - Progressing by Weldon PickingJones, Arzell Mcgeehan E, RN Verbalization of understanding the information provided will improve 12/27/2017 1736 - Progressing by Weldon PickingJones, Carver Murakami E, RN Spiritual Needs Ability to function at adequate level 12/27/2017 1736 - Progressing by Weldon PickingJones, Chasty Randal E, RN

## 2017-12-28 NOTE — Progress Notes (Signed)
Patient is alert and oriented X 4. Patient denies SI, HI and AVH. Patient affect is cooperative and pleasant on the unit and with staff. Patient remains compliant with medications. Safety checks will continue Q 15 minutes.

## 2017-12-28 NOTE — Progress Notes (Signed)
Recreation Therapy Notes   Date: 12.31.2018   Time: 9:30 am   Location: Craft Room   Behavioral response: N/A   Intervention Topic: Coping skills   Discussion/Intervention: Patient did not attend group.   Clinical Observations/Feedback:  Patient did not attend group.   Teona Vargus LRT/CTRS         Myrle Wanek 12/28/2017 1:18 PM

## 2017-12-28 NOTE — Tx Team (Signed)
Interdisciplinary Treatment and Diagnostic Plan Update  12/28/2017 Time of Session: 11:47 AM  Bridget Carpenter MRN: [id number]  Principal Diagnosis: Bipolar I disorder, most recent episode mixed, severe with psychotic features (HCC)  Secondary Diagnoses: Principal Problem:   Bipolar I disorder, most recent episode mixed, severe with psychotic features (HCC) Active Problems:   Tobacco use disorder   Bacterial vaginosis   Current Medications:  Current Facility-Administered Medications  Medication Dose Route Frequency Provider Last Rate Last Dose  . acetaminophen (TYLENOL) tablet 650 mg  650 mg Oral Q6H PRN Clapacs, Jackquline DenmarkJohn T, MD   650 mg at 12/26/17 0829  . alum & mag hydroxide-simeth (MAALOX/MYLANTA) 200-200-20 MG/5ML suspension 30 mL  30 mL Oral Q4H PRN Clapacs, John T, MD      . ARIPiprazole (ABILIFY) tablet 20 mg  20 mg Oral Daily Pucilowska, Jolanta B, MD   20 mg at 12/28/17 0855  . [START ON 01/22/2018] ARIPiprazole ER SRER 400 mg  400 mg Intramuscular Q28 days Pucilowska, Jolanta B, MD      . clindamycin (CLEOCIN) 2 % vaginal cream 1 Applicatorful  1 Applicatorful Vaginal QHS Clapacs, John T, MD      . hydrOXYzine (ATARAX/VISTARIL) tablet 50 mg  50 mg Oral TID PRN Clapacs, John T, MD      . magnesium hydroxide (MILK OF MAGNESIA) suspension 30 mL  30 mL Oral Daily PRN Clapacs, John T, MD      . metroNIDAZOLE (FLAGYL) tablet 500 mg  500 mg Oral Q12H Clapacs, Jackquline DenmarkJohn T, MD   500 mg at 12/28/17 0858  . QUEtiapine (SEROQUEL) tablet 100 mg  100 mg Oral QHS Pucilowska, Jolanta B, MD   100 mg at 12/27/17 2116  . traZODone (DESYREL) tablet 100 mg  100 mg Oral QHS PRN Clapacs, John T, MD      . venlafaxine XR (EFFEXOR-XR) 24 hr capsule 150 mg  150 mg Oral Q breakfast Pucilowska, Jolanta B, MD   150 mg at 12/28/17 [id number]    PTA Medications: Medications Prior to Admission  Medication Sig Dispense Refill Last Dose  . ARIPiprazole (ABILIFY) 10 MG tablet Take 10 mg by mouth at bedtime.    11/11/2017  . ARIPiprazole (ABILIFY) 15 MG tablet Take 0.5 tablets (7.5 mg total) by mouth at bedtime. For mood control (Patient not taking: Reported on 12/24/2017) 21 tablet 0 Not Taking  . ARIPiprazole ER 400 MG SRER Inject 400 mg into the muscle every 30 (thirty) days. (Dur on 10-02-17): For mood control (Patient taking differently: Inject 400 mg into the muscle every 30 (thirty) days. Last filled on 11/11/2017) 1 each 0 11/11/2017  . benztropine (COGENTIN) 0.5 MG tablet Take 1 tablet (0.5 mg total) by mouth daily as needed for tremors (EPS). (Patient not taking: Reported on 12/24/2017) 18 tablet 0 Not Taking at Unknown time  . benztropine (COGENTIN) 1 MG tablet Take 1 mg by mouth 2 (two) times daily as needed for tremors.     Marland Kitchen. buPROPion (WELLBUTRIN) 75 MG tablet Take 1 tablet (75 mg total) by mouth every morning. For depression (Patient not taking: Reported on 12/24/2017) 30 tablet 0 Not Taking at Unknown time  . gabapentin (NEURONTIN) 100 MG capsule Take 1 capsule (100 mg total) by mouth 3 (three) times daily at 8am, 3pm and bedtime. (Patient not taking: Reported on 12/24/2017) 90 capsule 0 Not Taking at Unknown time  . hydrOXYzine (ATARAX/VISTARIL) 50 MG tablet Take 1 tablet (50 mg total) by mouth 3 (three) times daily as needed  for anxiety. (Patient not taking: Reported on 12/24/2017) 60 tablet 0 Not Taking at Unknown time  . traZODone (DESYREL) 100 MG tablet Take 1 tablet (100 mg total) by mouth at bedtime as needed for sleep. (Patient not taking: Reported on 12/24/2017) 30 tablet 0 Not Taking at Unknown time  . Triamcinolone Acetonide (TRIAMCINOLONE 0.1 % CREAM : EUCERIN) CREA Apply 1 application topically 2 (two) times daily. For Eczema       Patient Stressors: Financial difficulties Health problems Occupational concerns  Patient Strengths: Average or above average intelligence Capable of independent living Communication skills  Treatment Modalities: Medication Management, Group  therapy, Case management,  1 to 1 session with clinician, Psychoeducation, Recreational therapy.   Physician Treatment Plan for Primary Diagnosis: Bipolar I disorder, most recent episode mixed, severe with psychotic features (HCC) Long Term Goal(s): Improvement in symptoms so as ready for discharge  Short Term Goals: Ability to identify changes in lifestyle to reduce recurrence of condition will improve Ability to verbalize feelings will improve Ability to disclose and discuss suicidal ideas Ability to demonstrate self-control will improve Ability to identify and develop effective coping behaviors will improve Ability to maintain clinical measurements within normal limits will improve Compliance with prescribed medications will improve Ability to identify triggers associated with substance abuse/mental health issues will improve Ability to identify changes in lifestyle to reduce recurrence of condition will improve Ability to demonstrate self-control will improve Ability to identify triggers associated with substance abuse/mental health issues will improve  Medication Management: Evaluate patient's response, side effects, and tolerance of medication regimen.  Therapeutic Interventions: 1 to 1 sessions, Unit Group sessions and Medication administration.  Evaluation of Outcomes: Progressing  Physician Treatment Plan for Secondary Diagnosis: Principal Problem:   Bipolar I disorder, most recent episode mixed, severe with psychotic features (HCC) Active Problems:   Tobacco use disorder   Bacterial vaginosis   Long Term Goal(s): Improvement in symptoms so as ready for discharge  Short Term Goals: Ability to identify changes in lifestyle to reduce recurrence of condition will improve Ability to verbalize feelings will improve Ability to disclose and discuss suicidal ideas Ability to demonstrate self-control will improve Ability to identify and develop effective coping behaviors will  improve Ability to maintain clinical measurements within normal limits will improve Compliance with prescribed medications will improve Ability to identify triggers associated with substance abuse/mental health issues will improve Ability to identify changes in lifestyle to reduce recurrence of condition will improve Ability to demonstrate self-control will improve Ability to identify triggers associated with substance abuse/mental health issues will improve  Medication Management: Evaluate patient's response, side effects, and tolerance of medication regimen.  Therapeutic Interventions: 1 to 1 sessions, Unit Group sessions and Medication administration.  Evaluation of Outcomes: Progressing   RN Treatment Plan for Primary Diagnosis: Bipolar I disorder, most recent episode mixed, severe with psychotic features (HCC) Long Term Goal(s): Knowledge of disease and therapeutic regimen to maintain health will improve  Short Term Goals: Ability to demonstrate self-control, Ability to participate in decision making will improve, Ability to identify and develop effective coping behaviors will improve and Compliance with prescribed medications will improve  Medication Management: RN will administer medications as ordered by provider, will assess and evaluate patient's response and provide education to patient for prescribed medication. RN will report any adverse and/or side effects to prescribing provider.  Therapeutic Interventions: 1 on 1 counseling sessions, Psychoeducation, Medication administration, Evaluate responses to treatment, Monitor vital signs and CBGs as ordered, Perform/monitor CIWA, COWS, AIMS  and Fall Risk screenings as ordered, Perform wound care treatments as ordered.  Evaluation of Outcomes: Progressing   LCSW Treatment Plan for Primary Diagnosis: Bipolar I disorder, most recent episode mixed, severe with psychotic features (HCC) Long Term Goal(s): Safe transition to appropriate  next level of care at discharge, Engage patient in therapeutic group addressing interpersonal concerns.  Short Term Goals: Engage patient in aftercare planning with referrals and resources, Increase social support, Facilitate patient progression through stages of change regarding substance use diagnoses and concerns, Identify triggers associated with mental health/substance abuse issues and Increase skills for wellness and recovery  Therapeutic Interventions: Assess for all discharge needs, 1 to 1 time with Social worker, Explore available resources and support systems, Assess for adequacy in community support network, Educate family and significant other(s) on suicide prevention, Complete Psychosocial Assessment, Interpersonal group therapy.  Evaluation of Outcomes: Progressing   Progress in Treatment: Attending groups: Yes Participating in groups: Yes Taking medication as prescribed: Yes Toleration medication: Yes, no side effects reported at this time Family/Significant other contact made: completed with patient, patient refused family contact. Patient understands diagnosis: Yes AEB patient identifying a need to stay off of drugs Discussing patient identified problems/goals with staff: Yes Medical problems stabilized or resolved: Yes Denies suicidal/homicidal ideation: Yes Issues/concerns per patient self-inventory: None Other: N/A  New problem(s) identified: None identified at this time.   New Short Term/Long Term Goal(s): None identified at this time.   Discharge Plan or Barriers: Patient will return home with mother and will attend [facility] for treatment upon discharge.  Reason for Continuation of Hospitalization: Depression Withdrawal symptoms  Estimated Length of Stay: Discharge Today  Attendees: Patient:Bridget Carpenter 12/28/2017  11:47 AM Physician: Kristine Linea, MD        12/28/2017  11:47 AM Nursing: Leonia Reader, RN           12/28/2017  11:47 AM RN Care Manager:      12/28/2017  11:47 AM Social Worker: Johny Shears LCSWA           12/28/2017  11:47 AM Recreational Therapist: Danella Deis. Dreama Saa, LRT        12/28/2017  11:47 AM Other: Jake Shark, LCSW, Carles Collet Peer Support      12/28/2017  11:47 AM Other:   12/28/2017  11:47 AM   Scribe for Treatment Team: Johny Shears LCSWA 12/28/2017 11:47 AM

## 2017-12-28 NOTE — Discharge Summary (Signed)
Physician Discharge Summary Note  Patient:  [name] is an 26 y.o., female MRN:  [id number] DOB:  19-May-1991 Patient phone:  [phone] (home)  Patient address:   [address] [place] [address],  Total Time spent with patient: 30 minutes  Date of Admission:  12/25/2017 Date of Discharge: 12/28/2017  Reason for Admission:  Psychotic break  Identifying data. Bridget Carpenter is a 26 year old female with a history of bipolar disorder and substance abuse.  Chief complaint. "I hear demons."  History pf present illness. Information was obtained from the patient and the chart. The patient came to the ER complaining of auditory hallucinations. For the past two months she has been hearing voices of demons. There are no command hallucinations. Sometimes, she believes she can hear her daughter cry. She has suicidal thoughts but did not act upon them. She has been off her psychiatric medications even though claims to "give herself" injection of Abilify last month. Today, she only agrees to take "fluoxetine". She reports many symptoms of depression with poor sleep, weight loss, feeling of guilt and hopelessness. In addition to hearing voices, she is paranoid. She has been using cocaine and cannabis.  Past psychiatric history. Long history of mental illness, hospitalizations, medication trials and suicide attempt. Her life is chaotic and she has been notoriously noncompliant with treatment. She follows up with [facility].   Family psychiatric history. Substance abuse.  Social history. She technically stays with her mother but never for long. She has a daughter.   Principal Problem: Bipolar I disorder, most recent episode mixed, severe with psychotic features [facility]) Discharge Diagnoses: Patient Active Problem List   Diagnosis Date Noted  . Bipolar I disorder, most recent episode mixed, severe with psychotic features (HCC) [F31.64] 07/18/2017    Priority: High  . Bacterial vaginosis  [N76.0, B96.89] 12/25/2017  . PTSD (post-traumatic stress disorder) [F43.10] 09/02/2017  . Bipolar disorder, curr episode mixed, severe, with psychotic features (HCC) [F31.64] 09/02/2017  . 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 Overdose [T50.901A] 07/16/2017  . Aspiration pneumonia (HCC) [J69.0] 07/16/2017  . Acute respiratory failure (HCC) [J96.00] 07/16/2017  . Polysubstance abuse (HCC) [F19.10] 07/16/2017  . Eczema [L30.9] 05/08/2017  . Borderline personality disorder (HCC) [F60.3] 11/06/2016  . Cocaine use disorder, severe, dependence (HCC) [F14.20] 11/06/2016  . Cannabis use disorder, moderate, dependence (HCC) [F12.20] 11/06/2016  . Alcohol use disorder, mild, abuse [F10.10] 11/06/2016  . Asthma [J45.909] 09/23/2011  . Tobacco use disorder [F17.200] 09/15/2009    Past Medical History:  Past Medical History:  Diagnosis Date  . Asthma   . Depression 2009   Inpatient psych admission for SI, dissociative fugue  . Dissociative disorder or reaction 2009  . Eczema   . H/O: suicide attempt   . ODD (oppositional defiant disorder)   . PTSD (post-traumatic stress disorder)   . Schizoaffective disorder (HCC)   . Substance abuse [facility])     Past Surgical History:  Procedure Laterality Date  . ADENOIDECTOMY    . TONSILLECTOMY     Family History:  Family History  Adopted: Yes  Problem Relation Age of Onset  . Mental illness Mother   . Mental illness Father    Social History:  Social History   Substance and Sexual Activity  Alcohol Use Yes   Comment: sometimes     Social History   Substance and Sexual Activity  Drug Use No    Social History   Socioeconomic History  . Marital status: Single    Spouse name:  None  . Number of children: None  . Years of education: None  . Highest education level: None  Social Needs  . Financial resource strain: None  . Food insecurity - worry: None  . Food insecurity - inability: None  . Transportation needs - medical: None  .  Transportation needs - non-medical: None  Occupational History  . None  Tobacco Use  . Smoking status: Former Smoker    Packs/day: 0.50    Years: 2.00    Pack years: 1.00    Types: Cigarettes  . Smokeless tobacco: Never Used  Substance and Sexual Activity  . Alcohol use: Yes    Comment: sometimes  . Drug use: No  . Sexual activity: Yes  Other Topics Concern  . None  Social History Narrative  . None    Hospital Course:    Bridget Carpenter is a 26 year old female with a history of bipolar disorder admitted for psychotic break in the context of treatment noncompliance.  #Mood and psychosis, improved -Abilify 400 mg every 28 days, next dose on 01/21/2017 -continue Abilify 20 mg daily -continue Effexor 150 mg daily  -continue Seroquel 100 mg nightly   #Substance abuse -minimizes problems and declines residential treatment  #Metabolic syndrome monitoring -Lipid panel, TSH and HgbA1C are normal -EKG from July 2018, QTc 460  #Bacterial vaginosis -Metronidazol 500 mg BID for 12 doses -pregnancy test is negative  #Smoking -nicotine patch was available  #Disposition -discharge with the mother -follow up with [facility]  Physical Findings: AIMS: Facial and Oral Movements Muscles of Facial Expression: None, normal Lips and Perioral Area: None, normal Jaw: None, normal Tongue: None, normal,Extremity Movements Upper (arms, wrists, hands, fingers): None, normal Lower (legs, knees, ankles, toes): None, normal, Trunk Movements Neck, shoulders, hips: None, normal, Overall Severity Severity of abnormal movements (highest score from questions above): None, normal Incapacitation due to abnormal movements: None, normal Patient's awareness of abnormal movements (rate only patient's report): No Awareness, Dental Status Current problems with teeth and/or dentures?: No Does patient usually wear dentures?: No  CIWA:    COWS:     Musculoskeletal: Strength & Muscle Tone: within normal  limits Gait & Station: normal Patient leans: N/A  Psychiatric Specialty Exam: Physical Exam  Nursing note and vitals reviewed. Psychiatric: She has a normal mood and affect. Her speech is normal and behavior is normal. Thought content normal. Cognition and memory are normal. She expresses impulsivity.    Review of Systems  Neurological: Negative.   Psychiatric/Behavioral: Positive for substance abuse.  All other systems reviewed and are negative.   Blood pressure 116/75, pulse (!) 58, temperature 98.3 F (36.8 C), temperature source Oral, resp. rate 16, height 5\' 7"  (1.702 m), weight 60.3 kg (133 lb), SpO2 100 %.Body mass index is 20.83 kg/m.  General Appearance: Casual  Eye Contact:  Good  Speech:  Clear and Coherent  Volume:  Normal  Mood:  Euthymic  Affect:  Appropriate  Thought Process:  Goal Directed and Descriptions of Associations: Intact  Orientation:  Full (Time, Place, and Person)  Thought Content:  WDL  Suicidal Thoughts:  No  Homicidal Thoughts:  No  Memory:  Immediate;   Fair Recent;   Fair Remote;   Fair  Judgement:  Poor  Insight:  Lacking  Psychomotor Activity:  Normal  Concentration:  Concentration: Fair and Attention Span: Fair  Recall:  [employer] of Knowledge:  Fair  Language:  Fair  Akathisia:  No  Handed:  Right  AIMS (if indicated):  Assets:  Communication Skills Desire for Improvement Financial Resources/Insurance Housing Physical Health Resilience Social Support  ADL's:  Intact  Cognition:  WNL  Sleep:        Have you used any form of tobacco in the last 30 days? (Cigarettes, Smokeless Tobacco, Cigars, and/or Pipes): No  Has this patient used any form of tobacco in the last 30 days? (Cigarettes, Smokeless Tobacco, Cigars, and/or Pipes) Yes, Yes, A prescription for an FDA-approved tobacco cessation medication was offered at discharge and the patient refused  Blood Alcohol level:  Lab Results  Component Value Date   [facility]  [facility]   ETH <5 09/01/2017    Metabolic Disorder Labs:  Lab Results  Component Value Date   HGBA1C 5.0 12/26/2017   MPG 96.8 12/26/2017   MPG 94 07/19/2017   Lab Results  Component Value Date   PROLACTIN 21.5 11/06/2016   PROLACTIN 49.4 (H) 08/13/2016   Lab Results  Component Value Date   CHOL 100 12/26/2017   TRIG 19 12/26/2017   HDL 71 12/26/2017   CHOLHDL 1.4 12/26/2017   VLDL 4 12/26/2017   LDLCALC 25 12/26/2017   LDLCALC 54 07/19/2017    See Psychiatric Specialty Exam and Suicide Risk Assessment completed by Attending Physician prior to discharge.  Discharge destination:  Home  Is patient on multiple antipsychotic therapies at discharge:  Yes,   Do you recommend tapering to monotherapy for antipsychotics?  No   Has Patient had three or more failed trials of antipsychotic monotherapy by history:  Yes,   Antipsychotic medications that previously failed include:   1.  abilify., 2.  zyprexa. and 3.  risperdal.  Recommended Plan for Multiple Antipsychotic Therapies: Additional reason(s) for multiple antispychotic treatment:  inadequate response to a single agent  Discharge Instructions    Diet - low sodium heart healthy   Complete by:  As directed    Increase activity slowly   Complete by:  As directed      Allergies as of 12/28/2017      Reactions   Banana Hives      Medication List    STOP taking these medications   benztropine 0.5 MG tablet Commonly known as:  COGENTIN   benztropine 1 MG tablet Commonly known as:  COGENTIN   buPROPion 75 MG tablet Commonly known as:  WELLBUTRIN   gabapentin 100 MG capsule Commonly known as:  NEURONTIN   hydrOXYzine 50 MG tablet Commonly known as:  ATARAX/VISTARIL   traZODone 100 MG tablet Commonly known as:  DESYREL   triamcinolone 0.1 % cream : eucerin Crea     TAKE these medications     Indication  ARIPiprazole 20 MG tablet Commonly known as:  ABILIFY Take 1 tablet (20 mg total) by mouth  daily. What changed:    medication strength  how much to take  when to take this  additional instructions  Another medication with the same name was removed. Continue taking this medication, and follow the directions you see here.  Indication:  Mixed Bipolar Affective Disorder   ARIPiprazole ER 400 MG Srer Inject 400 mg into the muscle every 28 (twenty-eight) days. Start taking on:  01/22/2018 What changed:    when to take this  additional instructions  Another medication with the same name was removed. Continue taking this medication, and follow the directions you see here.  Indication:  Mixed Bipolar Affective Disorder   metroNIDAZOLE 500 MG tablet Commonly known as:  FLAGYL Take 1 tablet (500 mg  total) by mouth every 12 (twelve) hours.  Indication:  Cervix Erosion   QUEtiapine 100 MG tablet Commonly known as:  SEROQUEL Take 1 tablet (100 mg total) by mouth at bedtime.  Indication:  Depressive Phase of Manic-Depression   venlafaxine XR 150 MG 24 hr capsule Commonly known as:  EFFEXOR-XR Take 1 capsule (150 mg total) by mouth daily with breakfast.  Indication:  Major Depressive Disorder      Follow-up Information    [facility] Follow up.   Why:  Please meet with your CST Team Lead Monica on Wednesday 12/30/2017 at 10am. Contact information: [address] [place] [address] [phone]           Follow-up recommendations:  Activity:  as tolerated Diet:  regular Other:  keep follow up appointments  Comments:     Signed: Kristine LineaJolanta Kimarion Chery, MD 12/28/2017, 11:43 AM

## 2017-12-28 NOTE — Progress Notes (Signed)
Patient is alert and oriented X 4. Patient denies SI, HI and AVH. Patient educated on discharge instructions. Patient verbalized understanding of follow up appointment and medication schedule. Patient received 7 day supply of medications. Patient belongings returned to patient.  Patient rates pain 0/10. Patient escorted to waiting area where patient's mother was waiting to transport her home in personal vehicle.

## 2017-12-28 NOTE — Progress Notes (Signed)
  [facility]  Will you be returning to the same living situation after discharge:  Yes,  Home with mother At discharge, do you have transportation home?: Yes,  Mother will come to pick you up Do you have the ability to pay for your medications: Yes,  Refered to a provider who can assist  Release of information consent forms completed and in the chart;  Patient's signature needed at discharge.  Patient to Follow up at: Follow-up Information    [facility] Follow up.   Why:  Please meet with your CST Team Lead Monica on Wednesday 12/30/2017 at 10am. Contact information: [address] [place] [address] [phone]           Next level of care provider has access to [facility] Link:no  Safety Planning and Suicide Prevention discussed: Yes Completed with patient, Refused family contact  Have you used any form of tobacco in the last 30 days? (Cigarettes, Smokeless Tobacco, Cigars, and/or Pipes): No  Has patient been referred to the Quitline?: N/A patient is not a smoker  Patient has been referred for addiction treatment: Yes  Bridget ShearsCassandra  Rayvn Rickerson, LCSW 12/28/2017, 11:51 AM

## 2017-12-28 NOTE — BHH Suicide Risk Assessment (Signed)
[facility] Discharge Suicide Risk Assessment   Principal Problem: Bipolar I disorder, most recent episode mixed, severe with psychotic features [facility](HCC) Discharge Diagnoses:  Patient Active Problem List   Diagnosis Date Noted  . Bipolar I disorder, most recent episode mixed, severe with psychotic features (HCC) [F31.64] 07/18/2017    Priority: High  . Bacterial vaginosis [N76.0, B96.89] 12/25/2017  . PTSD (post-traumatic stress disorder) [F43.10] 09/02/2017  . Bipolar disorder, curr episode mixed, severe, with psychotic features (HCC) [F31.64] 09/02/2017  . 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 Overdose [T50.901A] 07/16/2017  . Aspiration pneumonia (HCC) [J69.0] 07/16/2017  . Acute respiratory failure (HCC) [J96.00] 07/16/2017  . Polysubstance abuse (HCC) [F19.10] 07/16/2017  . Eczema [L30.9] 05/08/2017  . Borderline personality disorder (HCC) [F60.3] 11/06/2016  . Cocaine use disorder, severe, dependence (HCC) [F14.20] 11/06/2016  . Cannabis use disorder, moderate, dependence (HCC) [F12.20] 11/06/2016  . Alcohol use disorder, mild, abuse [F10.10] 11/06/2016  . Asthma [J45.909] 09/23/2011  . Tobacco use disorder [F17.200] 09/15/2009    Total Time spent with patient: 30 minutes  Musculoskeletal: Strength & Muscle Tone: within normal limits Gait & Station: normal Patient leans: N/A  Psychiatric Specialty Exam: Review of Systems  Neurological: Negative.   Psychiatric/Behavioral: Positive for substance abuse.  All other systems reviewed and are negative.   Blood pressure 116/75, pulse (!) 58, temperature 98.3 F (36.8 C), temperature source Oral, resp. rate 16, height 5\' 7"  (1.702 m), weight 60.3 kg (133 lb), SpO2 100 %.Body mass index is 20.83 kg/m.  General Appearance: Casual  Eye Contact::  Good  Speech:  Clear and Coherent409  Volume:  Normal  Mood:  Euthymic  Affect:  Appropriate  Thought Process:  Goal Directed and Descriptions of Associations: Intact  Orientation:  Full  (Time, Place, and Person)  Thought Content:  WDL  Suicidal Thoughts:  No  Homicidal Thoughts:  No  Memory:  Immediate;   Fair Recent;   Fair Remote;   Fair  Judgement:  Poor  Insight:  Lacking  Psychomotor Activity:  Normal  Concentration:  Fair  Recall:  Fis[employer]Fair  Fund of Knowledge:Fair  Language: Fair  Akathisia:  No  Handed:  Right  AIMS (if indicated):     Assets:  Communication Skills Desire for Improvement Financial Resources/Insurance Housing Physical Health Resilience Social Support  Sleep:     Cognition: WNL  ADL's:  Intact   Mental Status Per Nursing Assessment::   On Admission:     Demographic Factors:  NA  Loss Factors: NA  Historical Factors: Prior suicide attempts, Family history of mental illness or substance abuse and Impulsivity  Risk Reduction Factors:   Responsible for children under 26 years of age, Sense of responsibility to family, Living with another person, especially a relative, Positive social support and Positive therapeutic relationship  Continued Clinical Symptoms:  Bipolar Disorder:   Mixed State Alcohol/Substance Abuse/Dependencies  Cognitive Features That Contribute To Risk:  None    Suicide Risk:  Minimal: No identifiable suicidal ideation.  Patients presenting with no risk factors but with morbid ruminations; may be classified as minimal risk based on the severity of the depressive symptoms  Follow-up Information    [facility] Follow up.   Why:  Please meet with your CST Team Lead Monica on Wednesday 12/30/2017 at 10am. Contact information: [address] [place] [address] [phone]           Plan Of Care/Follow-up recommendations:  Activity:  as tolerated Diet:  low sodium heart healthy  Other:  keep follow up appoitments  Kristine LineaJolanta Manjot Hinks, MD 12/28/2017, 11:40 AM

## 2018-05-02 ENCOUNTER — Encounter: Payer: Self-pay | Admitting: Emergency Medicine

## 2018-05-02 ENCOUNTER — Other Ambulatory Visit: Payer: Self-pay

## 2018-05-02 ENCOUNTER — Emergency Department
Admission: EM | Admit: 2018-05-02 | Discharge: 2018-05-03 | Disposition: A | Discharge status: Other Acute Inpatient Hospital | Payer: Medicaid Other | Attending: Emergency Medicine | Admitting: Emergency Medicine

## 2018-05-02 DIAGNOSIS — R45851 Suicidal ideations: Secondary | ICD-10-CM | POA: Diagnosis not present

## 2018-05-02 DIAGNOSIS — F3164 Bipolar disorder, current episode mixed, severe, with psychotic features: Secondary | ICD-10-CM | POA: Diagnosis not present

## 2018-05-02 DIAGNOSIS — Z79899 Other long term (current) drug therapy: Secondary | ICD-10-CM | POA: Diagnosis not present

## 2018-05-02 DIAGNOSIS — F250 Schizoaffective disorder, bipolar type: Secondary | ICD-10-CM | POA: Insufficient documentation

## 2018-05-02 DIAGNOSIS — Z87891 Personal history of nicotine dependence: Secondary | ICD-10-CM | POA: Insufficient documentation

## 2018-05-02 DIAGNOSIS — J45909 Unspecified asthma, uncomplicated: Secondary | ICD-10-CM | POA: Diagnosis not present

## 2018-05-02 DIAGNOSIS — F1420 Cocaine dependence, uncomplicated: Secondary | ICD-10-CM | POA: Diagnosis present

## 2018-05-02 DIAGNOSIS — F259 Schizoaffective disorder, unspecified: Secondary | ICD-10-CM

## 2018-05-02 DIAGNOSIS — R451 Restlessness and agitation: Secondary | ICD-10-CM | POA: Diagnosis present

## 2018-05-02 LAB — COMPREHENSIVE METABOLIC PANEL
ALT: 12 U/L — ABNORMAL LOW (ref 14–54)
AST: 27 U/L (ref 15–41)
Albumin: 4.1 g/dL (ref 3.5–5.0)
Alkaline Phosphatase: 45 U/L (ref 38–126)
Anion gap: 8 (ref 5–15)
BUN: 19 mg/dL (ref 6–20)
CHLORIDE: 107 mmol/L (ref 101–111)
CO2: 23 mmol/L (ref 22–32)
CREATININE: 0.95 mg/dL (ref 0.44–1.00)
Calcium: 8.8 mg/dL — ABNORMAL LOW (ref 8.9–10.3)
GFR calc Af Amer: >60 mL/min (ref >60)
GLUCOSE: 127 mg/dL — AB (ref 65–99)
Potassium: 3.4 mmol/L — ABNORMAL LOW (ref 3.5–5.1)
SODIUM: 138 mmol/L (ref 135–145)
Total Bilirubin: 1.1 mg/dL (ref 0.3–1.2)
Total Protein: 7.1 g/dL (ref 6.5–8.1)

## 2018-05-02 LAB — CBC
HEMATOCRIT: 36.8 % (ref 35.0–47.0)
Hemoglobin: 12.5 g/dL (ref 12.0–16.0)
MCH: 32.2 pg (ref 26.0–34.0)
MCHC: 34.1 g/dL (ref 32.0–36.0)
MCV: 94.5 fL (ref 80.0–100.0)
Platelets: 223 10*3/uL (ref 150–440)
RBC: 3.9 MIL/uL (ref 3.80–5.20)
RDW: 13.5 % (ref 11.5–14.5)
WBC: 6.9 10*3/uL (ref 3.6–11.0)

## 2018-05-02 LAB — ACETAMINOPHEN LEVEL: Acetaminophen (Tylenol), Serum: <10 ug/mL — ABNORMAL LOW (ref 10–30)

## 2018-05-02 LAB — ETHANOL: Alcohol, Ethyl (B): <10 mg/dL (ref <10)

## 2018-05-02 LAB — SALICYLATE LEVEL: Salicylate Lvl: <7 mg/dL (ref 2.8–30.0)

## 2018-05-02 MED ORDER — ARIPIPRAZOLE 10 MG PO TABS
20.0000 mg | ORAL_TABLET | Freq: Every day | ORAL | Status: DC
  Administered 2018-05-02 – 2018-05-03 (×2): 20 mg via ORAL
  Filled 2018-05-02 (×2): qty 2

## 2018-05-02 MED ORDER — VENLAFAXINE HCL ER 75 MG PO CP24
150.0000 mg | ORAL_CAPSULE | Freq: Every day | ORAL | Status: DC
  Administered 2018-05-03: 150 mg via ORAL
  Filled 2018-05-02: qty 2

## 2018-05-02 MED ORDER — QUETIAPINE FUMARATE 25 MG PO TABS
100.0000 mg | ORAL_TABLET | Freq: Every day | ORAL | Status: DC
Start: 2018-05-02 — End: 2018-05-03
  Administered 2018-05-02: 100 mg via ORAL
  Filled 2018-05-02: qty 4

## 2018-05-02 NOTE — BH Assessment (Signed)
SOC recommends inpatient psychiatric treatment.  

## 2018-05-02 NOTE — ED Triage Notes (Signed)
Dressed out by this Careers adviser snow that brought pt in.

## 2018-05-02 NOTE — ED Notes (Signed)

## 2018-05-02 NOTE — ED Notes (Signed)
Pt given warm blankets per request. No meal trays in fridge, will call dietary to bring more up when available. Pt denies SI/HI.
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
## 2018-05-02 NOTE — ED Notes (Signed)
IVC pt, SOC called, moved to [employer]

## 2018-05-02 NOTE — ED Notes (Signed)
Patient received PM snack. 

## 2018-05-02 NOTE — ED Notes (Signed)
[facility]

## 2018-05-02 NOTE — ED Triage Notes (Signed)
Pt reports she is homeless. Mother IVC pt and per papers has not been eating/bathing/taking meds.  Pt denies SI/HI.

## 2018-05-02 NOTE — ED Notes (Signed)
Pt had refused to take prescribed meds but eventually agreed to take them after much prompting by staff. Abilify  given per orders. Also agreed to finally give urine specimen but states cannot pee right now. She states she will let us know when she can. Currently resting quietly in bed with eyes open. Flat affect, mood better than earlier. Asking if she can go home now because she doesn't want to stay here. States she has things to do other than stay locked up here. Denies SI/HI/AVH and pain. Monitoring continues for safety.

## 2018-05-03 ENCOUNTER — Other Ambulatory Visit: Payer: Self-pay

## 2018-05-03 ENCOUNTER — Inpatient Hospital Stay
Admission: AD | Admit: 2018-05-03 | Discharge: 2018-05-05 | DRG: 882 | Disposition: A | Discharge status: Home / Self Care | Payer: Medicaid Other | Source: Intra-hospital | Attending: Psychiatry | Admitting: Psychiatry

## 2018-05-03 ENCOUNTER — Encounter: Payer: Self-pay | Admitting: *Deleted

## 2018-05-03 DIAGNOSIS — J45909 Unspecified asthma, uncomplicated: Secondary | ICD-10-CM | POA: Diagnosis present

## 2018-05-03 DIAGNOSIS — F1220 Cannabis dependence, uncomplicated: Secondary | ICD-10-CM | POA: Diagnosis present

## 2018-05-03 DIAGNOSIS — R45851 Suicidal ideations: Secondary | ICD-10-CM | POA: Diagnosis present

## 2018-05-03 DIAGNOSIS — Z720 Tobacco use: Secondary | ICD-10-CM | POA: Diagnosis not present

## 2018-05-03 DIAGNOSIS — F603 Borderline personality disorder: Secondary | ICD-10-CM | POA: Diagnosis present

## 2018-05-03 DIAGNOSIS — F3164 Bipolar disorder, current episode mixed, severe, with psychotic features: Secondary | ICD-10-CM | POA: Diagnosis present

## 2018-05-03 DIAGNOSIS — Z79899 Other long term (current) drug therapy: Secondary | ICD-10-CM

## 2018-05-03 DIAGNOSIS — F1010 Alcohol abuse, uncomplicated: Secondary | ICD-10-CM | POA: Diagnosis present

## 2018-05-03 DIAGNOSIS — Z818 Family history of other mental and behavioral disorders: Secondary | ICD-10-CM | POA: Diagnosis not present

## 2018-05-03 DIAGNOSIS — F1420 Cocaine dependence, uncomplicated: Secondary | ICD-10-CM | POA: Diagnosis present

## 2018-05-03 DIAGNOSIS — Z9119 Patient's noncompliance with other medical treatment and regimen: Secondary | ICD-10-CM | POA: Diagnosis not present

## 2018-05-03 DIAGNOSIS — Z9089 Acquired absence of other organs: Secondary | ICD-10-CM | POA: Diagnosis not present

## 2018-05-03 DIAGNOSIS — F250 Schizoaffective disorder, bipolar type: Secondary | ICD-10-CM | POA: Diagnosis present

## 2018-05-03 DIAGNOSIS — F19959 Other psychoactive substance use, unspecified with psychoactive substance-induced psychotic disorder, unspecified: Secondary | ICD-10-CM | POA: Diagnosis present

## 2018-05-03 DIAGNOSIS — F4310 Post-traumatic stress disorder, unspecified: Principal | ICD-10-CM | POA: Diagnosis present

## 2018-05-03 DIAGNOSIS — Z9114 Patient's other noncompliance with medication regimen: Secondary | ICD-10-CM | POA: Diagnosis not present

## 2018-05-03 DIAGNOSIS — F17200 Nicotine dependence, unspecified, uncomplicated: Secondary | ICD-10-CM | POA: Diagnosis present

## 2018-05-03 MED ORDER — ARIPIPRAZOLE 10 MG PO TABS
20.0000 mg | ORAL_TABLET | Freq: Every day | ORAL | Status: DC
  Administered 2018-05-04 – 2018-05-05 (×2): 20 mg via ORAL
  Filled 2018-05-03 (×2): qty 2

## 2018-05-03 MED ORDER — ACETAMINOPHEN 325 MG PO TABS: 650.0000 mg | ORAL_TABLET | Freq: Four times a day (QID) | ORAL | Status: DC | PRN

## 2018-05-03 MED ORDER — HYDROXYZINE HCL 25 MG PO TABS: 25.0000 mg | ORAL_TABLET | Freq: Three times a day (TID) | ORAL | Status: DC | PRN

## 2018-05-03 MED ORDER — QUETIAPINE FUMARATE 100 MG PO TABS
100.0000 mg | ORAL_TABLET | Freq: Every day | ORAL | Status: DC
  Administered 2018-05-03 – 2018-05-04 (×2): 100 mg via ORAL
  Filled 2018-05-03 (×2): qty 1

## 2018-05-03 MED ORDER — ARIPIPRAZOLE ER 400 MG IM SRER
400.0000 mg | Freq: Once | INTRAMUSCULAR | Status: DC
  Filled 2018-05-03: qty 2

## 2018-05-03 MED ORDER — ARIPIPRAZOLE ER 400 MG IM SRER
400.0000 mg | Freq: Once | INTRAMUSCULAR | Status: AC
  Administered 2018-05-04: 400 mg via INTRAMUSCULAR
  Filled 2018-05-03: qty 2

## 2018-05-03 MED ORDER — MAGNESIUM HYDROXIDE 400 MG/5ML PO SUSP: 30.0000 mL | Freq: Every day | ORAL | Status: DC | PRN

## 2018-05-03 MED ORDER — ALUM & MAG HYDROXIDE-SIMETH 200-200-20 MG/5ML PO SUSP: 30.0000 mL | ORAL | Status: DC | PRN

## 2018-05-03 MED ORDER — VENLAFAXINE HCL ER 75 MG PO CP24
150.0000 mg | ORAL_CAPSULE | Freq: Every day | ORAL | Status: DC
  Administered 2018-05-04 – 2018-05-05 (×2): 150 mg via ORAL
  Filled 2018-05-03 (×2): qty 2

## 2018-05-03 NOTE — ED Notes (Signed)
Nurse reported to [employer] in the BMU.

## 2018-05-03 NOTE — ED Notes (Signed)
Patient in room talking to self, Patient does remain calm. Will continue to monitor.

## 2018-05-03 NOTE — BHH Group Notes (Signed)
BHH Group Notes:  (Nursing/MHT/Case Management/Adjunct)  Date:  05/03/2018  Time:  9:07 PM  Type of Therapy:  Group Therapy  Participation Level:  Did Not Attend  Summary of Progress/Problems:  Mayra Neer 05/03/2018, 9:07 PM

## 2018-05-03 NOTE — ED Provider Notes (Signed)
-----------------------------------------   7:03 AM on 05/03/2018 -----------------------------------------   Blood pressure 113/71, pulse 79, temperature 98 F (36.7 C), temperature source Oral, resp. rate 17, height  (1.702 m), weight 60.3 kg (133 lb), last menstrual period 04/27/2018, SpO2 100 %.  The patient had no acute events since last update.  Sleeping at this time.  Disposition is pending Psychiatry/Behavioral Medicine team recommendations.     Irean Hong, MD 05/03/18 [phone]
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
## 2018-05-03 NOTE — ED Notes (Signed)
Patient ate 100% of lunch and beverage, no signs of distress at this time.

## 2018-05-03 NOTE — ED Notes (Signed)
Dr. Toni Amend talking to Patient, no behavioral issues noted, q15 minute checks and camera surveillance for safety.

## 2018-05-03 NOTE — Consult Note (Signed)
[facility] Psychiatry Consult   Reason for Consult: Consult for 27 year old woman well-known to the psychiatry service.  Patient was petitioned by her mother with reports of noncompliance and psychotic behavior. Referring Physician: [facility] Patient Identification: Bridget Carpenter MRN:  [id number] Principal Diagnosis: <principal problem not specified> Diagnosis:   Patient Active Problem List   Diagnosis Date Noted  . Bacterial vaginosis [N76.0, B96.89] 12/25/2017  . PTSD (post-traumatic stress disorder) [F43.10] 09/02/2017  . Bipolar disorder, curr episode mixed, severe, with psychotic features ([place]) [F31.64] 09/02/2017  . 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 Bipolar I disorder, most recent episode mixed, severe with psychotic features ([place]) [F31.64] 07/18/2017  . Overdose [T50.901A] 07/16/2017  . Aspiration pneumonia ([place]) [J69.0] 07/16/2017  . Acute respiratory failure ([place]) [J96.00] 07/16/2017  . Polysubstance abuse ([place]) [F19.10] 07/16/2017  . Eczema [L30.9] 05/08/2017  . Borderline personality disorder ([place]) [F60.3] 11/06/2016  . Cocaine use disorder, severe, dependence ([place]) [F14.20] 11/06/2016  . Cannabis use disorder, moderate, dependence ([place]) [F12.20] 11/06/2016  . Alcohol use disorder, mild, abuse [F10.10] 11/06/2016  . Asthma [J45.909] 09/23/2011  . Tobacco use disorder [F17.200] 09/15/2009    Total Time spent with patient: 1 hour  Subjective:   Bridget Carpenter is a 27 y.o. female patient admitted with "my mother did this because she is spiteful".  HPI: Patient seen and interviewed chart reviewed.  27 year old woman with a history of chronic mental health problems and substance abuse issues.  Mother took out commitment paperwork alleging that the patient has been noncompliant with her medication, not taking care of her health adequately, doing things that indicate psychotic thinking and bizarre behavior and has been agitated.  On interview the patient  denies all of this.  She claims that she is fully compliant with her treatment.  She has no insight into what her mother might be concerned about.  Patient denies that she is having any suicidal thoughts.  When asked about hallucinations she becomes more vague and evasive about it.  Cannot tell me when she last went to [facility] or had her injection.  Denies to me that she has been abusing any drugs recently but has not given a urine sample.  Medical history: Patient has a history of pretty good health outside of her mental illness and substance abuse  Social history: Patient mainly stays with her mother and that is where all her belongings are but she often is out of the house staying with other people in the community.  Substance abuse history: Pretty extensive history of abuse of cocaine and marijuana complicating her illness.  Alcohol 2.  Past Psychiatric History: Multiple prior hospitalizations last one in December 2018.  Usually similar situation with substance abuse noncompliance and acute psychosis and agitation that resolved pretty quickly  Risk to Self: Suicidal Ideation: No Suicidal Intent: No Is patient at risk for suicide?: No Suicidal Plan?: No Access to Means: No What has been your use of drugs/alcohol within the last 12 months?: Cocaine How many times?: 0 Other Self Harm Risks: None reported  Triggers for Past Attempts: Other (Comment) Intentional Self Injurious Behavior: None Risk to Others: Homicidal Ideation: No Thoughts of Harm to Others: No Current Homicidal Intent: No Current Homicidal Plan: No Access to Homicidal Means: No Identified Victim: None reported  History of harm to others?: No Assessment of Violence: None Noted Violent Behavior Description: None reported  Does patient have access to weapons?: No Criminal Charges Pending?: Yes Describe Pending Criminal Charges: possession of drug paraphernalia Does  patient have a court date: Yes Court Date: 05/13/18 Prior  Inpatient Therapy: Prior Inpatient Therapy: Yes Prior Therapy Dates: 12/25/2017;07/18/2017;05/07/2017;11/05/2016;08/13/2016 Prior Therapy Facilty/Provider(s): [facility]  Reason for Treatment: Schizoaffective Disorder Bipolar Type Prior Outpatient Therapy: Prior Outpatient Therapy: No Does patient have an ACCT team?: No Does patient have Intensive In-House Services?  : No Does patient have [facility] services? : No Does patient have P4CC services?: No  Past Medical History:  Past Medical History:  Diagnosis Date  . Asthma   . Depression 2009   Inpatient psych admission for SI, dissociative fugue  . Dissociative disorder or reaction 2009  . Eczema   . H/O: suicide attempt   . ODD (oppositional defiant disorder)   . PTSD (post-traumatic stress disorder)   . Schizoaffective disorder ([place])   . Substance abuse [facility])     Past Surgical History:  Procedure Laterality Date  . ADENOIDECTOMY    . TONSILLECTOMY     Family History:  Family History  Adopted: Yes  Problem Relation Age of Onset  . Mental illness Mother   . Mental illness Father    Family Psychiatric  History: None known Social History:  Social History   Substance and Sexual Activity  Alcohol Use Yes   Comment: sometimes     Social History   Substance and Sexual Activity  Drug Use No  . Types: Marijuana, "Crack" cocaine, Heroin    Social History   Socioeconomic History  . Marital status: Single    Spouse name: Not on file  . Number of children: Not on file  . Years of education: Not on file  . Highest education level: Not on file  Occupational History  . Not on file  Social Needs  . Financial resource strain: Not on file  . Food insecurity:    Worry: Not on file    Inability: Not on file  . Transportation needs:    Medical: Not on file    Non-medical: Not on file  Tobacco Use  . Smoking status: Former Smoker    Packs/day: 0.50    Years: 2.00    Pack years: 1.00    Types: Cigarettes  . Smokeless  tobacco: Never Used  Substance and Sexual Activity  . Alcohol use: Yes    Comment: sometimes  . Drug use: No    Types: Marijuana, "Crack" cocaine, Heroin  . Sexual activity: Yes  Lifestyle  . Physical activity:    Days per week: Not on file    Minutes per session: Not on file  . Stress: Not on file  Relationships  . Social connections:    Talks on phone: Not on file    Gets together: Not on file    Attends religious service: Not on file    Active member of club or organization: Not on file    Attends meetings of clubs or organizations: Not on file    Relationship status: Not on file  Other Topics Concern  . Not on file  Social History Narrative  . Not on file   Additional Social History:    Allergies:   Allergies  Allergen Reactions  . Banana Hives    Labs:  Results for orders placed or performed during the hospital encounter of 05/02/18 (from the past 48 hour(s))  Comprehensive metabolic panel     Status: Abnormal   Collection Time: 05/02/18  9:33 AM  Result Value Ref Range   Sodium 138 135 - 145 mmol/L   Potassium 3.4 (L) 3.5 -  5.1 mmol/L   Chloride 107 101 - 111 mmol/L   CO2 23 22 - 32 mmol/L   Glucose, Bld 127 (H) 65 - 99 mg/dL   BUN 19 6 - 20 mg/dL   Creatinine, Ser 0.95 0.44 - 1.00 mg/dL   Calcium 8.8 (L) 8.9 - 10.3 mg/dL   Total Protein 7.1 6.5 - 8.1 g/dL   Albumin 4.1 3.5 - 5.0 g/dL   AST 27 15 - 41 U/L   ALT 12 (L) 14 - 54 U/L   Alkaline Phosphatase 45 38 - 126 U/L   Total Bilirubin 1.1 0.3 - 1.2 mg/dL   GFR calc non Af Amer >60 >60 mL/min   GFR calc Af Amer >60 >60 mL/min    Comment: (NOTE) The eGFR has been calculated using the CKD EPI equation. This calculation has not been validated in all clinical situations. eGFR's persistently <60 mL/min signify possible Chronic Kidney Disease.    Anion gap 8 5 - 15    Comment: Performed at [facility], [address] [place], [place] [address]  Ethanol     Status: None   Collection Time:  05/02/18  9:33 AM  Result Value Ref Range   Alcohol, Ethyl (B) <10 <10 mg/dL    Comment:        LOWEST DETECTABLE LIMIT FOR SERUM ALCOHOL IS 10 mg/dL FOR MEDICAL PURPOSES ONLY Performed at [facility], [address] [place], [place] [address]   Salicylate level     Status: None   Collection Time: 05/02/18  9:33 AM  Result Value Ref Range   Salicylate Lvl <6.2 2.8 - 30.0 mg/dL    Comment: Performed at [facility], [address] [place], [place] [address]  Acetaminophen level     Status: Abnormal   Collection Time: 05/02/18  9:33 AM  Result Value Ref Range   Acetaminophen (Tylenol), Serum <10 (L) 10 - 30 ug/mL    Comment:        THERAPEUTIC CONCENTRATIONS VARY SIGNIFICANTLY. A RANGE OF 10-30 ug/mL MAY BE AN EFFECTIVE CONCENTRATION FOR MANY PATIENTS. HOWEVER, SOME ARE BEST TREATED AT CONCENTRATIONS OUTSIDE THIS RANGE. ACETAMINOPHEN CONCENTRATIONS >150 ug/mL AT 4 HOURS AFTER INGESTION AND >50 ug/mL AT 12 HOURS AFTER INGESTION ARE OFTEN ASSOCIATED WITH TOXIC REACTIONS. Performed at [facility], [address] [place], [place] [address]   cbc     Status: None   Collection Time: 05/02/18  9:33 AM  Result Value Ref Range   WBC 6.9 3.6 - 11.0 K/uL   RBC 3.90 3.80 - 5.20 MIL/uL   Hemoglobin 12.5 12.0 - 16.0 g/dL   HCT 36.8 35.0 - 47.0 %   MCV 94.5 80.0 - 100.0 fL   MCH 32.2 26.0 - 34.0 pg   MCHC 34.1 32.0 - 36.0 g/dL   RDW 13.5 11.5 - 14.5 %   Platelets 223 150 - 440 K/uL    Comment: Performed at [facility], [address], [place], [place] [address]    Current Facility-Administered Medications  Medication Dose Route Frequency Provider Last Rate Last Dose  . ARIPiprazole (ABILIFY) tablet 20 mg  20 mg Oral Daily Lavonia Drafts, MD   20 mg at 05/03/18 1105  . ARIPiprazole ER (ABILIFY MAINTENA) injection 400 mg  400 mg Intramuscular Once Diannah Rindfleisch T, MD      . QUEtiapine (SEROQUEL) tablet 100 mg  100 mg Oral QHS Lavonia Drafts, MD   100 mg at 05/02/18 2122  . venlafaxine  XR (EFFEXOR-XR) 24 hr capsule 150 mg  150 mg Oral Q breakfast Lavonia Drafts, MD   150 mg at 05/03/18 1105   Current Outpatient Medications  Medication Sig Dispense Refill  . ARIPiprazole (ABILIFY) 20 MG tablet Take 1 tablet (20 mg total) by mouth daily. 30 tablet 1  . ARIPiprazole ER 400 MG SRER Inject 400 mg into the muscle every 28 (twenty-eight) days. 1 each 1  . metroNIDAZOLE (FLAGYL) 500 MG tablet Take 1 tablet (500 mg total) by mouth every 12 (twelve) hours. 10 tablet 0  . QUEtiapine (SEROQUEL) 100 MG tablet Take 1 tablet (100 mg total) by mouth at bedtime. 30 tablet 1  . venlafaxine XR (EFFEXOR-XR) 150 MG 24 hr capsule Take 1 capsule (150 mg total) by mouth daily with breakfast. 30 capsule 1    Musculoskeletal: Strength & Muscle Tone: within normal limits Gait & Station: normal Patient leans: N/A  Psychiatric Specialty Exam: Physical Exam  Nursing note and vitals reviewed. Constitutional: She appears well-developed and well-nourished.  HENT:  Head: Normocephalic and atraumatic.  Eyes: Pupils are equal, round, and reactive to light. Conjunctivae are normal.  Neck: Normal range of motion.  Cardiovascular: Regular rhythm and normal heart sounds.  Respiratory: Effort normal. No respiratory distress.  GI: Soft.  Musculoskeletal: Normal range of motion.  Neurological: She is alert.  Skin: Skin is warm and dry.  Psychiatric: Her affect is angry and labile. Her speech is tangential. She is agitated. She is not aggressive. Thought content is paranoid. Cognition and memory are impaired. She expresses impulsivity. She expresses no homicidal and no suicidal ideation.    Review of Systems  Constitutional: Negative.   HENT: Negative.   Eyes: Negative.   Respiratory: Negative.   Cardiovascular: Negative.   Gastrointestinal: Negative.   Musculoskeletal: Negative.   Skin: Negative.   Neurological: Negative.    Psychiatric/Behavioral: Negative.     Blood pressure 113/71, pulse 79, temperature 98 F (36.7 C), temperature source Oral, resp. rate 17, height '5\' 7"'$  (1.702 m), weight 60.3 kg (133 lb), last menstrual period 04/27/2018, SpO2 100 %.Body mass index is 20.83 kg/m.  General Appearance: Disheveled  Eye Contact:  Fair  Speech:  Normal Rate  Volume:  Normal  Mood:  Irritable  Affect:  Congruent  Thought Process:  Disorganized  Orientation:  Full (Time, Place, and Person)  Thought Content:  Paranoid Ideation, Rumination and Tangential  Suicidal Thoughts:  No  Homicidal Thoughts:  No  Memory:  Immediate;   Fair Recent;   Fair Remote;   Fair  Judgement:  Impaired  Insight:  Shallow  Psychomotor Activity:  Restlessness  Concentration:  Concentration: Fair  Recall:  [employer] of Knowledge:  Fair  Language:  Fair  Akathisia:  No  Handed:  Right  AIMS (if indicated):     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 Support  ADL's:  Intact  Cognition:  WNL  Sleep:        Treatment Plan Summary: Daily contact with patient to assess and evaluate symptoms and progress in treatment, Medication management and Plan Patient is denying symptoms but I think the complaint from her mother is detailed enough with description of psychotic behavior that when added to the fact that the patient has been acting strangely and talking to herself in the emergency room that she has been evasive about psychotic symptoms and avoidant about the drug screen all make it probably wiser to go ahead and admit her to the hospital with her  history of noncompliance.  Orders completed to continue outpatient medicine.  We will try to get her the long-acting Abilify shot as soon as possible.  Full set of labs will be done.  I will try to contact her mother to see if she can bring her some clothes.,  Disposition: Recommend psychiatric Inpatient admission when medically cleared. Supportive therapy provided about  ongoing stressors.  Alethia Berthold, MD 05/03/2018 1:22 PM

## 2018-05-03 NOTE — ED Notes (Signed)
Patient voices understanding of being discharged/readmit to BMU, Patient is cooperative, no signs of distress, all belongings given back to Patient, Patient transferred via w/c to unit in police custody and with nurse.

## 2018-05-03 NOTE — ED Notes (Signed)
Nurse attempted administer ability injection and she refused, nurse tried to coax, and she refuses.

## 2018-05-03 NOTE — BH Assessment (Signed)
Dr. Shary Key does meet criteria for inpatient psychiatric treatment. Patient pending bed assignment on BMU.

## 2018-05-03 NOTE — ED Notes (Signed)
Nurse attempted to give injection of abilify that was ordered and she still refuses. Patient is calm, no behavioral issues noted at this time, no signs of hallucinations at this time, will continue to monitor.

## 2018-05-03 NOTE — Progress Notes (Signed)
Received patient on the unit in scrubs. Patient smiling and stating that she is here because her mother is mean and IVC'd her.  Denies any depression.  Denies SI/HI/AVH.   Body search and skin assessment performed.  No contraband found. Eczema noted to bilateral arms and legs, back and torso.

## 2018-05-03 NOTE — Tx Team (Signed)
Initial Treatment Plan 05/03/2018 7:05 PM Bridget Carpenter ZOX:[id number]    PATIENT STRESSORS: Medication change or noncompliance Substance abuse   PATIENT STRENGTHS: Active sense of humor Average or above average intelligence Capable of independent living   PATIENT IDENTIFIED PROBLEMS: "substance abuse"  Medication stabilization                   DISCHARGE CRITERIA:  Improved stabilization in mood, thinking, and/or behavior Motivation to continue treatment in a less acute level of care  PRELIMINARY DISCHARGE PLAN: Return to previous living arrangement  PATIENT/FAMILY INVOLVEMENT: This treatment plan has been presented to and reviewed with the patient, Bridget Carpenter, and/or family member, .  The patient and family have been given the opportunity to ask questions and make suggestions.  Elige Radon, RN 05/03/2018, 7:05 PM

## 2018-05-03 NOTE — BH Assessment (Signed)
Patient is to be admitted to [facility] by Dr. Toni Amend.  Attending Physician will be Dr. Jennet Maduro.   Patient has been assigned to room 320, by [facility] Charge Nurse [place].   Intake Paper Work has been signed and placed on patient chart.  ER staff is aware of the admission:  Glenda:ER Sectary   Dr. Lenard Lance: ER MD   Toniann Fail: Patient's Nurse   Ethelene Browns: Patient Access.

## 2018-05-03 NOTE — ED Provider Notes (Signed)
-----------------------------------------   2:08 PM on 05/03/2018 -----------------------------------------  Patient has been seen and evaluated by psychiatry they will be admitting to their service for further treatment.   Minna Antis, MD 05/03/18 1408

## 2018-05-04 ENCOUNTER — Encounter: Payer: Self-pay | Admitting: Psychiatry

## 2018-05-04 DIAGNOSIS — F250 Schizoaffective disorder, bipolar type: Secondary | ICD-10-CM

## 2018-05-04 LAB — URINE DRUG SCREEN, QUALITATIVE (ARMC ONLY)
AMPHETAMINES, UR SCREEN: NOT DETECTED
Barbiturates, Ur Screen: NOT DETECTED
Benzodiazepine, Ur Scrn: NOT DETECTED
CANNABINOID 50 NG, UR ~~LOC~~: POSITIVE — AB
COCAINE METABOLITE, UR ~~LOC~~: POSITIVE — AB
MDMA (ECSTASY) UR SCREEN: NOT DETECTED
Methadone Scn, Ur: NOT DETECTED
OPIATE, UR SCREEN: NOT DETECTED
PHENCYCLIDINE (PCP) UR S: NOT DETECTED
Tricyclic, Ur Screen: NOT DETECTED

## 2018-05-04 LAB — HCG, QUANTITATIVE, PREGNANCY: hCG, Beta Chain, Quant, S: <1 m[IU]/mL (ref <5)

## 2018-05-04 MED ORDER — ARIPIPRAZOLE ER 400 MG IM SRER
400.0000 mg | INTRAMUSCULAR | Status: DC
  Filled 2018-05-04: qty 2

## 2018-05-04 NOTE — Progress Notes (Signed)
Patient received Abilify injection to R) deltoid on the above date and time. Education provided to patient prior to administration and verbalized understanding of the information.

## 2018-05-04 NOTE — Plan of Care (Deleted)
Patient food intake is appropriate.

## 2018-05-04 NOTE — Progress Notes (Addendum)
D: Patient denies SI/HI/AVH. Patient rates hopelessness as 0,  depression as 0 , and anxiety as 0.  Patient affect is appropriate and her mood is pleasant.  Patient did NOT attend evening group. No distress noted. A: Support and encouragement offered. Patient declined Abilify Injection but said she would take it later in the day.   Q 15 min checks continued for patient safety. R: Patient receptive. Patient remains safe on the unit.      Patient slept 7 hrs.

## 2018-05-04 NOTE — Plan of Care (Signed)
Patient affect and mood is bright, socializing adequately and cooperating with his medical regimen, denies any SI/HI and states that the voices are not constant it comes and goes away, and states that I feel good now with my medications, patient is educated on safety and nutritions and 15 minute rounding is maintained . Problem: Education: Goal: Knowledge of [facility] Education information/materials will improve Outcome: Progressing   Problem: Safety: Goal: Periods of time without injury will increase Outcome: Progressing   Problem: Education: Goal: Will be free of psychotic symptoms Outcome: Progressing Goal: Knowledge of the prescribed therapeutic regimen will improve Outcome: Progressing   Problem: Coping: Goal: Coping ability will improve Outcome: Progressing Goal: Will verbalize feelings Outcome: Progressing   Problem: Role Relationship: Goal: Ability to communicate needs accurately will improve Outcome: Progressing   Problem: Self-Care: Goal: Ability to participate in self-care as condition permits will improve Outcome: Progressing

## 2018-05-04 NOTE — Progress Notes (Signed)
Recreation Therapy Notes          Bridget Carpenter 05/04/2018 12:09 PM

## 2018-05-04 NOTE — [facility] Group Notes (Signed)
CSW Group Therapy Note  05/04/2018  Time:  0900  Type of Therapy and Topic: Group Therapy: Goals Group: SMART Goals    Participation Level:  Did Not Attend    Description of Group:   The purpose of a daily goals group is to assist and guide patients in setting recovery/wellness-related goals. The objective is to set goals as they relate to the crisis in which they were admitted. Patients will be using SMART goal modalities to set measurable goals. Characteristics of realistic goals will be discussed and patients will be assisted in setting and processing how one will reach their goal. Facilitator will also assist patients in applying interventions and coping skills learned in psycho-education groups to the SMART goal and process how one will achieve defined goal.    Therapeutic Goals:  -Patients will develop and document one goal related to or their crisis in which brought them into treatment.  -Patients will be guided by LCSW using SMART goal setting modality in how to set a measurable, attainable, realistic and time sensitive goal.  -Patients will process barriers in reaching goal.  -Patients will process interventions in how to overcome and successful in reaching goal.    Patient's Goal:  Pt was invited to attend group but chose not to attend. CSW will continue to encourage pt to attend group throughout their admission.   Therapeutic Modalities:  Motivational Interviewing  Cognitive Behavioral Therapy  Crisis Intervention Model  SMART goals setting  Ervin Hensley, MSW, LCSW Clinical Social Worker 05/04/2018 10:04 AM   

## 2018-05-04 NOTE — [facility] Suicide Risk Assessment (Signed)
[facility] Admission Suicide Risk Assessment   Nursing information obtained from:    Demographic factors:    Current Mental Status:    Loss Factors:    Historical Factors:    Risk Reduction Factors:     Total Time spent with patient: 1 hour Principal Problem: Schizoaffective disorder, bipolar type (HCC) Diagnosis:   Patient Active Problem List   Diagnosis Date Noted  . Schizoaffective disorder, bipolar type (HCC) [F25.0] 05/03/2018    Priority: High  . Bipolar I disorder, most recent episode mixed, severe with psychotic features (HCC) [F31.64] 07/18/2017    Priority: High  . Bacterial vaginosis [N76.0, B96.89] 12/25/2017  . PTSD (post-traumatic stress disorder) [F43.10] 09/02/2017  . Bipolar disorder, curr episode mixed, severe, with psychotic features (HCC) [F31.64] 09/02/2017  . 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 Overdose [T50.901A] 07/16/2017  . Aspiration pneumonia (HCC) [J69.0] 07/16/2017  . Acute respiratory failure (HCC) [J96.00] 07/16/2017  . Polysubstance abuse (HCC) [F19.10] 07/16/2017  . Eczema [L30.9] 05/08/2017  . Borderline personality disorder (HCC) [F60.3] 11/06/2016  . Cocaine use disorder, severe, dependence (HCC) [F14.20] 11/06/2016  . Cannabis use disorder, moderate, dependence (HCC) [F12.20] 11/06/2016  . Alcohol use disorder, mild, abuse [F10.10] 11/06/2016  . Asthma [J45.909] 09/23/2011  . Tobacco use disorder [F17.200] 09/15/2009   Subjective Data: suicidal ideation  Continued Clinical Symptoms:  Alcohol Use Disorder Identification Test Final Score (AUDIT): 3 The "Alcohol Use Disorders Identification Test", Guidelines for Use in Primary Care, Second Edition.  World Science writer [facility]). Score between 0-7:  no or low risk or alcohol related problems. Score between 8-15:  moderate risk of alcohol related problems. Score between 16-19:  high risk of alcohol related problems. Score 20 or above:  warrants further diagnostic evaluation for alcohol  dependence and treatment.   CLINICAL FACTORS:   Alcohol/Substance Abuse/Dependencies Schizophrenia:   Less than 37 years old   Musculoskeletal: Strength & Muscle Tone: within normal limits Gait & Station: normal Patient leans: N/A  Psychiatric Specialty Exam: Physical Exam  Nursing note and vitals reviewed. Psychiatric: She has a normal mood and affect. Her speech is normal and behavior is normal. Thought content normal. Cognition and memory are normal. She expresses impulsivity.    Review of Systems  Neurological: Negative.   Psychiatric/Behavioral: Positive for hallucinations and substance abuse.  All other systems reviewed and are negative.   Blood pressure 98/60, pulse 78, temperature (!) 97.5 F (36.4 C), temperature source Oral, resp. rate 18, height  (1.702 m), weight 65.8 kg (145 lb), last menstrual period 04/27/2018, SpO2 100 %.Body mass index is 22.71 kg/m.  General Appearance: Casual  Eye Contact:  Good  Speech:  Clear and Coherent  Volume:  Normal  Mood:  Dysphoric  Affect:  Congruent  Thought Process:  Goal Directed and Descriptions of Associations: Intact  Orientation:  Full (Time, Place, and Person)  Thought Content:  WDL  Suicidal Thoughts:  No  Homicidal Thoughts:  No  Memory:  Immediate;   Fair Recent;   Fair Remote;   Fair  Judgement:  Poor  Insight:  Lacking  Psychomotor Activity:  Normal  Concentration:  Concentration: Fair and Attention Span: Fair  Recall:  [employer] of Knowledge:  Fair  Language:  Fair  Akathisia:  No  Handed:  Right  AIMS (if indicated):     Assets:  Communication Skills Desire for Improvement Financial Resources/Insurance Physical Health Resilience Social Support  ADL's:  Intact  Cognition:  WNL  Sleep:  Number of Hours:  7      COGNITIVE FEATURES THAT CONTRIBUTE TO RISK:  None    SUICIDE RISK:   Minimal: No identifiable suicidal ideation.  Patients presenting with no risk factors but with morbid  ruminations; may be classified as minimal risk based on the severity of the depressive symptoms  PLAN OF CARE: hospital admission, medication management, substance abuse counseling, discharge planning.  Ms. Ellinger is a 27 year old femasle with a history of schizophrenia and substance abuse petitioned by her mother for suicidal ideation and medication noncompliance which the patient denies.   #Suicidal ideation -patient adamantly denies any thoughts, intention or plans to hurt herself or others  #Mood/Psychosis -restart Abilify maintenna 400 mg every 28 days, first shot today -restart Effexor 150 mg daily -restart oral Abilify 20 mg daily -continue Seroquel 100 mg nightly  #Substance abuse -patient minimizes problems and declines treatment  #Smoking cessation -nicotine patch is available  #Labs, Metabolic syndrome monitoring -lipid panel, TSH and HgbA1C were recently obtained -pregnancy test is negative -EKG  #Disposition -patient is homeless -refuses to follow up with [facility]    I certify that inpatient services furnished can reasonably be expected to improve the patient's condition.   Kristine Linea, MD 05/04/2018, 11:46 AM

## 2018-05-04 NOTE — Plan of Care (Signed)
Patient up ad lib with a steady gait. Denies SI/HI/AVH and pain at this time. Present in the milieu for meals and medications. Patient attended group this afternoon and participated appropriately. Abilify injection administered in R) deltoid. Patient education provided prior to administration. Milieu remains safe with q 15 minute safety checks.
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
## 2018-05-04 NOTE — H&P (Signed)
Psychiatric Admission Assessment Adult  Patient Identification: [name] Shayan Jaggi MRN:  [id number] Date of Evaluation:  05/04/2018 Chief Complaint:  schizoaffective disorder Principal Diagnosis: Schizoaffective disorder, bipolar type (HCC) Diagnosis:   Patient Active Problem List   Diagnosis Date Noted  . Schizoaffective disorder, bipolar type (HCC) [F25.0] 05/03/2018    Priority: High  . Bipolar I disorder, most recent episode mixed, severe with psychotic features (HCC) [F31.64] 07/18/2017    Priority: High  . Bacterial vaginosis [N76.0, B96.89] 12/25/2017  . PTSD (post-traumatic stress disorder) [F43.10] 09/02/2017  . Bipolar disorder, curr episode mixed, severe, with psychotic features (HCC) [F31.64] 09/02/2017  . 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 Overdose [T50.901A] 07/16/2017  . Aspiration pneumonia (HCC) [J69.0] 07/16/2017  . Acute respiratory failure (HCC) [J96.00] 07/16/2017  . Polysubstance abuse (HCC) [F19.10] 07/16/2017  . Eczema [L30.9] 05/08/2017  . Borderline personality disorder (HCC) [F60.3] 11/06/2016  . Cocaine use disorder, severe, dependence (HCC) [F14.20] 11/06/2016  . Cannabis use disorder, moderate, dependence (HCC) [F12.20] 11/06/2016  . Alcohol use disorder, mild, abuse [F10.10] 11/06/2016  . Asthma [J45.909] 09/23/2011  . Tobacco use disorder [F17.200] 09/15/2009   History of Present Illness:   Identifying data. Ms. Aicher is a 27 year old female with a history of schizoaffective disorder and substance use.  Chief complaint. "My mother took papers out of spite."  History of present illness. Information was obtained from the patient and the chart. The patient was petitioned by her mother for suicidal threats, agitation and "not eating, bathing or taking medications". The patient believes that the mother wanted her out of the house as she owns her mother money. The patient admits that she has not been compliant with treatment but refuses follow up with  [facility] or an ACT team. She denies any symptoms of depression, anxiety or psychosis. She has been taking all medications as prescribed here, including Abilify maintena shot. She is not agitated. She presents well on interview. She minimizes her drug problems, declines treatment, and makes itr clear that she has no intention to stop.   Past psychiatric history. Long history of mental illness with multiple hospitalizations and medication trials. She has bee notoriously noncompliant. There were suicide attempts.  Family psychiatric history. Substance abuse.  Social history. She is homeless. 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 She always goes back to her mother, at least temporarily. She has a daughter.   Total Time spent with patient: 1 hour  Is the patient at risk to self? No.  Has the patient been a risk to self in the past 6 months? No.  Has the patient been a risk to self within the distant past? Yes.    Is the patient a risk to others? No.  Has the patient been a risk to others in the past 6 months? No.  Has the patient been a risk to others within the distant past? No.   Prior Inpatient Therapy:   Prior Outpatient Therapy:    Alcohol Screening: 1. How often do you have a drink containing alcohol?: 2 to 3 times a week 2. How many drinks containing alcohol do you have on a typical day when you are drinking?: 1 or 2 3. How often do you have six or more drinks on one occasion?: Never AUDIT-C Score: 3 4. How often during the last year have you found that you were not able to stop drinking once you had started?: Never 5. How often during the last year have  you failed to do what was normally expected from you becasue of drinking?: Never 6. How often during the last year have you needed a first drink in the morning to get yourself going after a heavy drinking session?: Never 7. How often during the last year have you had a feeling of  guilt of remorse after drinking?: Never 8. How often during the last year have you been unable to remember what happened the night before because you had been drinking?: Never 9. Have you or someone else been injured as a result of your drinking?: No 10. Has a relative or friend or a doctor or another health worker been concerned about your drinking or suggested you cut down?: No Alcohol Use Disorder Identification Test Final Score (AUDIT): 3 Intervention/Follow-up: AUDIT Score <7 follow-up not indicated Substance Abuse History in the last 12 months:  Yes.   Consequences of Substance Abuse: Negative Previous Psychotropic Medications: Yes Psychological Evaluations: No  Past Medical History:  Past Medical History:  Diagnosis Date  . Asthma   . Depression 2009   Inpatient psych admission for SI, dissociative fugue  . Dissociative disorder or reaction 2009  . Eczema   . H/O: suicide attempt   . ODD (oppositional defiant disorder)   . PTSD (post-traumatic stress disorder)   . Schizoaffective disorder (HCC)   . Substance abuse [facility])     Past Surgical History:  Procedure Laterality Date  . ADENOIDECTOMY    . TONSILLECTOMY     Family History:  Family History  Adopted: Yes  Problem Relation Age of Onset  . Mental illness Mother   . Mental illness Father     Tobacco Screening: Have you used any form of tobacco in the last 30 days? (Cigarettes, Smokeless Tobacco, Cigars, and/or Pipes): Yes Tobacco use, Select all that apply: 5 or more cigarettes per day Are you interested in Tobacco Cessation Medications?: No, patient refused Counseled patient on smoking cessation including recognizing danger situations, developing coping skills and basic information about quitting provided: Yes Social History:  Social History   Substance and Sexual Activity  Alcohol Use Yes  . Alcohol/week: 0.6 oz  . Types: 1 Cans of beer per week   Comment: weekly     Social History   Substance and Sexual  Activity  Drug Use Yes  . Types: Marijuana, "Crack" cocaine, Heroin   Comment: 4 grams daily    Additional Social History:                           Allergies:   Allergies  Allergen Reactions  . Banana Hives   Lab Results:  Results for orders placed or performed during the hospital encounter of 05/03/18 (from the past 48 hour(s))  Urine Drug Screen, Qualitative     Status: Abnormal   Collection Time: 05/03/18 11:32 PM  Result Value Ref Range   Tricyclic, Ur Screen NONE DETECTED NONE DETECTED   Amphetamines, Ur Screen NONE DETECTED NONE DETECTED   MDMA (Ecstasy)Ur Screen NONE DETECTED NONE DETECTED   Cocaine Metabolite,Ur [place] POSITIVE (A) NONE DETECTED   Opiate, Ur Screen NONE DETECTED NONE DETECTED   Phencyclidine (PCP) Ur S NONE DETECTED NONE DETECTED   Cannabinoid 50 Ng, Ur [place] POSITIVE (A) NONE DETECTED   Barbiturates, Ur Screen NONE DETECTED NONE DETECTED   Benzodiazepine, Ur Scrn NONE DETECTED NONE DETECTED   Methadone Scn, Ur NONE DETECTED NONE DETECTED    Comment: (NOTE) Tricyclics + metabolites, urine  Cutoff 1000 ng/mL Amphetamines + metabolites, urine  Cutoff 1000 ng/mL MDMA (Ecstasy), urine              Cutoff 500 ng/mL Cocaine Metabolite, urine          Cutoff 300 ng/mL Opiate + metabolites, urine        Cutoff 300 ng/mL Phencyclidine (PCP), urine         Cutoff 25 ng/mL Cannabinoid, urine                 Cutoff 50 ng/mL Barbiturates + metabolites, urine  Cutoff 200 ng/mL Benzodiazepine, urine              Cutoff 200 ng/mL Methadone, urine                   Cutoff 300 ng/mL The urine drug screen provides only a preliminary, unconfirmed analytical test result and should not be used for non-medical purposes. Clinical consideration and professional judgment should be applied to any positive drug screen result due to possible interfering substances. A more specific alternate chemical method must be used in order to obtain a confirmed analytical  result. Gas chromatography / mass spectrometry (GC/MS) is the preferred confirmat ory method. Performed at [facility], [address] [place], [place] [address]     Blood Alcohol level:  Lab Results  Component Value Date   [facility] <10 05/02/2018   ETH <10 12/24/2017    Metabolic Disorder Labs:  Lab Results  Component Value Date   HGBA1C 5.0 12/26/2017   MPG 96.8 12/26/2017   MPG 94 07/19/2017   Lab Results  Component Value Date   PROLACTIN 21.5 11/06/2016   PROLACTIN 49.4 (H) 08/13/2016   Lab Results  Component Value Date   CHOL 100 12/26/2017   TRIG 19 12/26/2017   HDL 71 12/26/2017   CHOLHDL 1.4 12/26/2017   VLDL 4 12/26/2017   LDLCALC 25 12/26/2017   LDLCALC 54 07/19/2017    Current Medications: Current Facility-Administered Medications  Medication Dose Route Frequency Provider Last Rate Last Dose  . acetaminophen (TYLENOL) tablet 650 mg  650 mg Oral Q6H PRN Clapacs, John T, MD      . alum & mag hydroxide-simeth (MAALOX/MYLANTA) 200-200-20 MG/5ML suspension 30 mL  30 mL Oral Q4H PRN Clapacs, John T, MD      . ARIPiprazole (ABILIFY) tablet 20 mg  20 mg Oral Daily Clapacs, [name], MD   20 mg at 05/04/18 0816  . [START ON 06/01/2018] ARIPiprazole ER (ABILIFY MAINTENA) injection 400 mg  400 mg Intramuscular Q28 days Dearia Wilmouth B, MD      . hydrOXYzine (ATARAX/VISTARIL) tablet 25 mg  25 mg Oral TID PRN Clapacs, John T, MD      . magnesium hydroxide (MILK OF MAGNESIA) suspension 30 mL  30 mL Oral Daily PRN Clapacs, John T, MD      . QUEtiapine (SEROQUEL) tablet 100 mg  100 mg Oral QHS Clapacs, John T, MD   100 mg at 05/03/18 2249  . venlafaxine XR (EFFEXOR-XR) 24 hr capsule 150 mg  150 mg Oral Q breakfast Clapacs, [name], MD   150 mg at 05/04/18 6045   PTA Medications: Medications Prior to Admission  Medication Sig Dispense Refill Last Dose  . ARIPiprazole (ABILIFY) 20 MG tablet Take 1 tablet (20 mg total) by mouth daily. 30 tablet 1   .  ARIPiprazole ER 400 MG SRER Inject 400 mg into the muscle every 28 (twenty-eight) days.  1 each 1   . metroNIDAZOLE (FLAGYL) 500 MG tablet Take 1 tablet (500 mg total) by mouth every 12 (twelve) hours. 10 tablet 0   . QUEtiapine (SEROQUEL) 100 MG tablet Take 1 tablet (100 mg total) by mouth at bedtime. 30 tablet 1   . venlafaxine XR (EFFEXOR-XR) 150 MG 24 hr capsule Take 1 capsule (150 mg total) by mouth daily with breakfast. 30 capsule 1     Musculoskeletal: Strength & Muscle Tone: within normal limits Gait & Station: normal Patient leans: N/A  Psychiatric Specialty Exam: Physical Exam  Nursing note and vitals reviewed. Constitutional: She is oriented to person, place, and time. She appears well-developed and well-nourished.  HENT:  Head: Normocephalic and atraumatic.  Eyes: Pupils are equal, round, and reactive to light. Conjunctivae and EOM are normal.  Neck: Normal range of motion. Neck supple.  Cardiovascular: Normal rate and regular rhythm.  Respiratory: Effort normal and breath sounds normal.  GI: Soft. Bowel sounds are normal.  Musculoskeletal: Normal range of motion.  Neurological: She is alert and oriented to person, place, and time.  Skin: Skin is warm and dry.  Psychiatric: She has a normal mood and affect. Her speech is normal and behavior is normal. Thought content normal. Cognition and memory are normal. She expresses impulsivity.    Review of Systems  Neurological: Negative.   Psychiatric/Behavioral: Positive for substance abuse.  All other systems reviewed and are negative.   Blood pressure 98/60, pulse 78, temperature (!) 97.5 F (36.4 C), temperature source Oral, resp. rate 18, height  (1.702 m), weight 65.8 kg (145 lb), last menstrual period 04/27/2018, SpO2 100 %.Body mass index is 22.71 kg/m.  See SRA                                                  Sleep:  Number of Hours: 7    Treatment Plan Summary: Daily contact with  patient to assess and evaluate symptoms and progress in treatment and Medication management   Ms. Barrilleaux is a 27 year old femasle with a history of schizophrenia and substance abuse petitioned by her mother for suicidal ideation and medication noncompliance which the patient denies.   #Suicidal ideation -patient adamantly denies any thoughts, intention or plans to hurt herself or others  #Mood/Psychosis -restart Abilify maintenna 400 mg every 28 days, first shot today -restart Effexor 150 mg daily -restart oral Abilify 20 mg daily -continue Seroquel 100 mg nightly  #Substance abuse -patient minimizes problems and declines treatment  #Smoking cessation -nicotine patch is available  #Labs, Metabolic syndrome monitoring -lipid panel, TSH and HgbA1C were recently obtained -pregnancy test is negative -EKG  #Admission status -IVC  #Disposition -patient is homeless -refuses to follow up with [facility]   Observation Level/Precautions:  15 minute checks  Laboratory:  CBC Chemistry Profile UDS UA  Psychotherapy:    Medications:    Consultations:    Discharge Concerns:    Estimated LOS:  Other:     Physician Treatment Plan for Primary Diagnosis: Schizoaffective disorder, bipolar type (HCC) Long Term Goal(s): Improvement in symptoms so as ready for discharge  Short Term Goals: Ability to identify changes in lifestyle to reduce recurrence of condition will improve, Ability to verbalize feelings will improve, Ability to disclose and discuss suicidal ideas, Ability to demonstrate self-control will improve, Ability to identify and develop effective coping  behaviors will improve, Ability to maintain clinical measurements within normal limits will improve, Compliance with prescribed medications will improve and Ability to identify triggers associated with substance abuse/mental health issues will improve  Physician Treatment Plan for Secondary Diagnosis: Principal Problem:   Schizoaffective  disorder, bipolar type (HCC) Active Problems:   Tobacco use disorder   Cocaine use disorder, severe, dependence (HCC)   Cannabis use disorder, moderate, dependence (HCC)  Long Term Goal(s): Improvement in symptoms so as ready for discharge  Short Term Goals: Ability to identify changes in lifestyle to reduce recurrence of condition will improve, Ability to demonstrate self-control will improve and Ability to identify triggers associated with substance abuse/mental health issues will improve  I certify that inpatient services furnished can reasonably be expected to improve the patient's condition.    Kristine Linea, MD 5/7/201912:42 PM

## 2018-05-04 NOTE — [facility] Suicide Risk Assessment (Signed)
[facility] INPATIENT:  Family/Significant Other Suicide Prevention Education  Suicide Prevention Education:  Patient Refusal for Family/Significant Other Suicide Prevention Education: The patient [name] has refused to provide written consent for family/significant other to be provided Family/Significant Other Suicide Prevention Education during admission and/or prior to discharge.  Physician notified.  Johny Shears 05/04/2018, 9:30 AM

## 2018-05-04 NOTE — [facility] Group Notes (Signed)
05/04/2018 1PM  Type of Therapy/Topic:  Group Therapy:  Feelings about Diagnosis  Participation Level:  Active   Description of Group:   This group will allow patients to explore their thoughts and feelings about diagnoses they have received. Patients will be guided to explore their level of understanding and acceptance of these diagnoses. Facilitator will encourage patients to process their thoughts and feelings about the reactions of others to their diagnosis and will guide patients in identifying ways to discuss their diagnosis with significant others in their lives. This group will be process-oriented, with patients participating in exploration of their own experiences, giving and receiving support, and processing challenge from other group members.   Therapeutic Goals: 1. Patient will demonstrate understanding of diagnosis as evidenced by identifying two or more symptoms of the disorder 2. Patient will be able to express two feelings regarding the diagnosis 3. Patient will demonstrate their ability to communicate their needs through discussion and/or role play  Summary of Patient Progress: Actively and appropriately engaged in the group. Patient was able to provide support and validation to other group members.Patient practiced active listening when interacting with the facilitator and other group members. Bridget Carpenter states that she "work on building a support system by making new friends who are supportive of my recovery."Patient in still in the process of obtaining treatment goals.        Therapeutic Modalities:   Cognitive Behavioral Therapy Brief Therapy Feelings Identification    Johny Shears, LCSW 05/04/2018 2:02 PM

## 2018-05-05 MED ORDER — ARIPIPRAZOLE 20 MG PO TABS: 20.0000 mg | ORAL_TABLET | Freq: Every day | ORAL | 1 refills | Status: DC

## 2018-05-05 MED ORDER — ARIPIPRAZOLE ER 400 MG IM SRER: 400.0000 mg | INTRAMUSCULAR | 1 refills | Status: DC

## 2018-05-05 MED ORDER — QUETIAPINE FUMARATE 100 MG PO TABS: 100.0000 mg | ORAL_TABLET | Freq: Every day | ORAL | 1 refills | Status: DC

## 2018-05-05 MED ORDER — VENLAFAXINE HCL ER 150 MG PO CP24: 150.0000 mg | ORAL_CAPSULE | Freq: Every day | ORAL | 1 refills | Status: DC

## 2018-05-05 NOTE — [facility] Suicide Risk Assessment (Addendum)
[facility] Discharge Suicide Risk Assessment   Principal Problem: Schizoaffective disorder, bipolar type [facility]) Discharge Diagnoses:  Patient Active Problem List   Diagnosis Date Noted  . Schizoaffective disorder, bipolar type (HCC) [F25.0] 05/03/2018    Priority: High  . Bipolar I disorder, most recent episode mixed, severe with psychotic features (HCC) [F31.64] 07/18/2017    Priority: High  . Bacterial vaginosis [N76.0, B96.89] 12/25/2017  . PTSD (post-traumatic stress disorder) [F43.10] 09/02/2017  . Bipolar disorder, curr episode mixed, severe, with psychotic features (HCC) [F31.64] 09/02/2017  . 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 Overdose [T50.901A] 07/16/2017  . Aspiration pneumonia (HCC) [J69.0] 07/16/2017  . Acute respiratory failure (HCC) [J96.00] 07/16/2017  . Polysubstance abuse (HCC) [F19.10] 07/16/2017  . Eczema [L30.9] 05/08/2017  . Borderline personality disorder (HCC) [F60.3] 11/06/2016  . Cocaine use disorder, severe, dependence (HCC) [F14.20] 11/06/2016  . Cannabis use disorder, moderate, dependence (HCC) [F12.20] 11/06/2016  . Alcohol use disorder, mild, abuse [F10.10] 11/06/2016  . Asthma [J45.909] 09/23/2011  . Tobacco use disorder [F17.200] 09/15/2009    Total Time spent with patient: 20 minutes plus 20 min on care coordination and documentation  Musculoskeletal: Strength & Muscle Tone: within normal limits Gait & Station: normal Patient leans: N/A  Psychiatric Specialty Exam: Review of Systems  Neurological: Negative.   Psychiatric/Behavioral: Positive for hallucinations and substance abuse.  All other systems reviewed and are negative.   Blood pressure 101/62, pulse 81, temperature 98.4 F (36.9 C), temperature source Oral, resp. rate 18, height  (1.702 m), weight 65.8 kg (145 lb), last menstrual period 04/27/2018, SpO2 100 %.Body mass index is 22.71 kg/m.  General Appearance: Casual  Eye Contact::  Good  Speech:  Clear and Coherent409  Volume:   Normal  Mood:  Euthymic  Affect:  Appropriate  Thought Process:  Goal Directed and Descriptions of Associations: Intact  Orientation:  Full (Time, Place, and Person)  Thought Content:  Hallucinations: Auditory  Suicidal Thoughts:  No  Homicidal Thoughts:  No  Memory:  Immediate;   Fair Recent;   Fair Remote;   Fair  Judgement:  Poor  Insight:  Lacking  Psychomotor Activity:  Normal  Concentration:  Fair  Recall:  [employer] of Knowledge:Fair  Language: Fair  Akathisia:  No  Handed:  Right  AIMS (if indicated):     Assets:  Communication Skills Desire for Improvement Financial Resources/Insurance Physical Health Resilience Social Support  Sleep:  Number of Hours: 7  Cognition: WNL  ADL's:  Intact   Mental Status Per Nursing Assessment::   On Admission:     Demographic Factors:  Adolescent or young adult and Low socioeconomic status  Loss Factors: NA  Historical Factors: Prior suicide attempts, Family history of mental illness or substance abuse and Impulsivity  Risk Reduction Factors:   Responsible for children under 57 years of age, Sense of responsibility to family and Positive social support  Continued Clinical Symptoms:  Bipolar Disorder:   Mixed State  Cognitive Features That Contribute To Risk:  None    Suicide Risk:  Minimal: No identifiable suicidal ideation.  Patients presenting with no risk factors but with morbid ruminations; may be classified as minimal risk based on the severity of the depressive symptoms  Follow-up Information    [facility], [employer]. Go on 05/06/2018.   Why:  Please attend your follow up appointment on Thursday 05/06/2018 at 9am. Thank you. Contact information: [address] [place] [address] [phone]  Plan Of Care/Follow-up recommendations:  Activity:  as tolerated Diet:  low sodium heart healthy Other:  keep follow up appointments  Kristine Linea, MD 05/05/2018, 8:54 AM

## 2018-05-05 NOTE — Discharge Summary (Signed)
Physician Discharge Summary Note  Patient:  [name] is an 27 y.o., female MRN:  [id number] DOB:  19-Aug-1991 Patient phone:  [phone] (home)  Patient address:   [address] [place] [address],  Total Time spent with patient: 20 minutes plus 20 min on care coordination and documantation  Date of Admission:  05/03/2018 Date of Discharge: 05/05/2018  Reason for Admission:  Suicidal ideation.  History of Present Illness:   Identifying data. Ms. Gilcrest is a 27 year old female with a history of schizoaffective disorder and substance use.  Chief complaint. "My mother took papers out of spite."  History of present illness. Information was obtained from the patient and the chart. The patient was petitioned by her mother for suicidal threats, agitation and "not eating, bathing or taking medications". The patient believes that the mother wanted her out of the house as she owns her mother money. The patient admits that she has not been compliant with treatment but refuses follow up with [facility] or an ACT team. She denies any symptoms of depression, anxiety or psychosis. She has been taking all medications as prescribed here, including Abilify maintena shot. She is not agitated. She presents well on interview. She minimizes her drug problems, declines treatment, and makes itr clear that she has no intention to stop.   Past psychiatric history. Long history of mental illness with multiple hospitalizations and medication trials. She has bee notoriously noncompliant. There were suicide attempts.  Family psychiatric history. Substance abuse.  Social history. She is homeless. 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 She always goes back to her mother, at least temporarily. She has a daughter.   Principal Problem: Schizoaffective disorder, bipolar type [facility]) Discharge Diagnoses: Patient Active Problem List   Diagnosis Date  Noted  . Schizoaffective disorder, bipolar type (HCC) [F25.0] 05/03/2018    Priority: High  . Bipolar I disorder, most recent episode mixed, severe with psychotic features (HCC) [F31.64] 07/18/2017    Priority: High  . Bacterial vaginosis [N76.0, B96.89] 12/25/2017  . PTSD (post-traumatic stress disorder) [F43.10] 09/02/2017  . Bipolar disorder, curr episode mixed, severe, with psychotic features (HCC) [F31.64] 09/02/2017  . 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 Overdose [T50.901A] 07/16/2017  . Aspiration pneumonia (HCC) [J69.0] 07/16/2017  . Acute respiratory failure (HCC) [J96.00] 07/16/2017  . Polysubstance abuse (HCC) [F19.10] 07/16/2017  . Eczema [L30.9] 05/08/2017  . Borderline personality disorder (HCC) [F60.3] 11/06/2016  . Cocaine use disorder, severe, dependence (HCC) [F14.20] 11/06/2016  . Cannabis use disorder, moderate, dependence (HCC) [F12.20] 11/06/2016  . Alcohol use disorder, mild, abuse [F10.10] 11/06/2016  . Asthma [J45.909] 09/23/2011  . Tobacco use disorder [F17.200] 09/15/2009   Past Medical History:  Past Medical History:  Diagnosis Date  . Asthma   . Depression 2009   Inpatient psych admission for SI, dissociative fugue  . Dissociative disorder or reaction 2009  . Eczema   . H/O: suicide attempt   . ODD (oppositional defiant disorder)   . PTSD (post-traumatic stress disorder)   . Schizoaffective disorder (HCC)   . Substance abuse [facility])     Past Surgical History:  Procedure Laterality Date  . ADENOIDECTOMY    . TONSILLECTOMY     Family History:  Family History  Adopted: Yes  Problem Relation Age of Onset  . Mental illness Mother   . Mental illness Father    Social History:  Social History   Substance and Sexual Activity  Alcohol Use Yes  .  Alcohol/week: 0.6 oz  . Types: 1 Cans of beer per week   Comment: weekly     Social History   Substance and Sexual Activity  Drug Use Yes  . Types: Marijuana, "Crack" cocaine, Heroin   Comment: 4  grams daily    Social History   Socioeconomic History  . Marital status: Single    Spouse name: Not on file  . Number of children: Not on file  . Years of education: Not on file  . Highest education level: Not on file  Occupational History  . Not on file  Social Needs  . Financial resource strain: Not on file  . Food insecurity:    Worry: Not on file    Inability: Not on file  . Transportation needs:    Medical: Not on file    Non-medical: Not on file  Tobacco Use  . Smoking status: Current Every Day Smoker    Packs/day: 0.25    Years: 2.00    Pack years: 0.50    Types: Cigarettes  . Smokeless tobacco: Never Used  Substance and Sexual Activity  . Alcohol use: Yes    Alcohol/week: 0.6 oz    Types: 1 Cans of beer per week    Comment: weekly  . Drug use: Yes    Types: Marijuana, "Crack" cocaine, Heroin    Comment: 4 grams daily  . Sexual activity: Yes    Birth control/protection: Condom  Lifestyle  . Physical activity:    Days per week: Not on file    Minutes per session: Not on file  . Stress: Not on file  Relationships  . Social connections:    Talks on phone: Not on file    Gets together: Not on file    Attends religious service: Not on file    Active member of club or organization: Not on file    Attends meetings of clubs or organizations: Not on file    Relationship status: Not on file  Other Topics Concern  . Not on file  Social History Narrative  . Not on file    Hospital Course:    Ms. Slavick is a 27 year old female with a history of schizophrenia and substance abuse petitioned by her mother for suicidal ideation and medication noncompliance. She was restarted on her regular medications, including Abilify maintena. Suicidal ideation has resolved. The patient denied any thoughts, intention or plans to hurt herself or others. She is forward thinking and optimistic about the future. She is a loving mother and daughter.  #Mood/Psychosis,  resolved -continue Abilify maintenna 400 mg every 28 days, next injection on 06/01/2018 -continue Effexor 150 mg daily -continue oral Abilify 20 mg daily for two weeks -continue Seroquel 100 mg nightly  #Substance abuse -patient minimizes problems and declines treatment  #Smoking cessation -nicotine patch is available  #Labs, Metabolic syndrome monitoring -lipid panel, TSH and HgbA1C were recently obtained -pregnancy test is negative  #Admission status -IVC  #Disposition -discharged with the mother -follow up with [facility]    Physical Findings: AIMS: Facial and Oral Movements Muscles of Facial Expression: None, normal Lips and Perioral Area: None, normal Jaw: None, normal Tongue: None, normal,Extremity Movements Upper (arms, wrists, hands, fingers): None, normal Lower (legs, knees, ankles, toes): Minimal, Trunk Movements Neck, shoulders, hips: None, normal, Overall Severity Severity of abnormal movements (highest score from questions above): None, normal Incapacitation due to abnormal movements: None, normal Patient's awareness of abnormal movements (rate only patient's report): No Awareness, Dental Status Current problems  with teeth and/or dentures?: No Does patient usually wear dentures?: No  CIWA:    COWS:     Musculoskeletal: Strength & Muscle Tone: within normal limits Gait & Station: normal Patient leans: N/A  Psychiatric Specialty Exam: Physical Exam  Nursing note and vitals reviewed. Psychiatric: She has a normal mood and affect. Her speech is normal. Thought content normal. She is actively hallucinating. Cognition and memory are normal. She expresses impulsivity.    Review of Systems  Neurological: Negative.   Psychiatric/Behavioral: Positive for hallucinations and substance abuse.  All other systems reviewed and are negative.   Blood pressure 101/62, pulse 81, temperature 98.4 F (36.9 C), temperature source Oral, resp. rate 18, height   (1.702 m), weight 65.8 kg (145 lb), last menstrual period 04/27/2018, SpO2 100 %.Body mass index is 22.71 kg/m.  General Appearance: Casual  Eye Contact:  Good  Speech:  Clear and Coherent  Volume:  Normal  Mood:  Euthymic  Affect:  Appropriate  Thought Process:  Goal Directed and Descriptions of Associations: Intact  Orientation:  Full (Time, Place, and Person)  Thought Content:  Hallucinations: Auditory  Suicidal Thoughts:  No  Homicidal Thoughts:  No  Memory:  Immediate;   Fair Recent;   Fair Remote;   Fair  Judgement:  Poor  Insight:  Lacking  Psychomotor Activity:  Normal  Concentration:  Concentration: Fair and Attention Span: Fair  Recall:  [employer] of Knowledge:  Fair  Language:  Fair  Akathisia:  No  Handed:  Right  AIMS (if indicated):     Assets:  Communication Skills Desire for Improvement Financial Resources/Insurance Physical Health Resilience Social Support  ADL's:  Intact  Cognition:  WNL  Sleep:  Number of Hours: 7     Have you used any form of tobacco in the last 30 days? (Cigarettes, Smokeless Tobacco, Cigars, and/or Pipes): Yes  Has this patient used any form of tobacco in the last 30 days? (Cigarettes, Smokeless Tobacco, Cigars, and/or Pipes) Yes, Yes, A prescription for an FDA-approved tobacco cessation medication was offered at discharge and the patient refused  Blood Alcohol level:  Lab Results  Component Value Date   [facility] <10 05/02/2018   ETH <10 12/24/2017    Metabolic Disorder Labs:  Lab Results  Component Value Date   HGBA1C 5.0 12/26/2017   MPG 96.8 12/26/2017   MPG 94 07/19/2017   Lab Results  Component Value Date   PROLACTIN 21.5 11/06/2016   PROLACTIN 49.4 (H) 08/13/2016   Lab Results  Component Value Date   CHOL 100 12/26/2017   TRIG 19 12/26/2017   HDL 71 12/26/2017   CHOLHDL 1.4 12/26/2017   VLDL 4 12/26/2017   LDLCALC 25 12/26/2017   LDLCALC 54 07/19/2017    See Psychiatric Specialty Exam and Suicide Risk  Assessment completed by Attending Physician prior to discharge.  Discharge destination:  Home  Is patient on multiple antipsychotic therapies at discharge:  Yes,   Do you recommend tapering to monotherapy for antipsychotics?  Yes   Has Patient had three or more failed trials of antipsychotic monotherapy by history:  No  Recommended Plan for Multiple Antipsychotic Therapies: Taper to monotherapy as described:  discontinue Seroquel  Discharge Instructions    Diet - low sodium heart healthy   Complete by:  As directed    Increase activity slowly   Complete by:  As directed      Allergies as of 05/05/2018      Reactions  Banana Hives      Medication List    STOP taking these medications   metroNIDAZOLE 500 MG tablet Commonly known as:  FLAGYL     TAKE these medications     Indication  ARIPiprazole 20 MG tablet Commonly known as:  ABILIFY Take 1 tablet (20 mg total) by mouth daily.  Indication:  MIXED BIPOLAR AFFECTIVE DISORDER   ARIPiprazole ER 400 MG Srer injection Commonly known as:  ABILIFY MAINTENA Inject 2 mLs (400 mg total) into the muscle every 28 (twenty-eight) days. Start taking on:  06/01/2018  Indication:  MIXED BIPOLAR AFFECTIVE DISORDER   QUEtiapine 100 MG tablet Commonly known as:  SEROQUEL Take 1 tablet (100 mg total) by mouth at bedtime.  Indication:  Depressive Phase of Manic-Depression   venlafaxine XR 150 MG 24 hr capsule Commonly known as:  EFFEXOR-XR Take 1 capsule (150 mg total) by mouth daily with breakfast.  Indication:  Major Depressive Disorder      Follow-up Information    [facility], [employer]. Go on 05/06/2018.   Why:  Please attend your follow up appointment on Thursday 05/06/2018 at 9am. Thank you. Contact information: [address] [place] [address] [phone]           Follow-up recommendations:  Activity:  as tolerated Diet:  low sodium heart healthy Other:  keep follow up appointments  Comments:     Signed: Kristine Linea, MD 05/05/2018, 12:23 PM

## 2018-05-05 NOTE — Tx Team (Signed)
Interdisciplinary Treatment and Diagnostic Plan Update  05/05/2018 Time of Session: 10:30am [name] MRN: [id number]  Principal Diagnosis: Schizoaffective disorder, bipolar type (HCC)  Secondary Diagnoses: Principal Problem:   Schizoaffective disorder, bipolar type (HCC) Active Problems:   Tobacco use disorder   Cocaine use disorder, severe, dependence (HCC)   Cannabis use disorder, moderate, dependence (HCC)   Current Medications:  Current Facility-Administered Medications  Medication Dose Route Frequency Provider Last Rate Last Dose  . acetaminophen (TYLENOL) tablet 650 mg  650 mg Oral Q6H PRN Clapacs, John T, MD      . alum & mag hydroxide-simeth (MAALOX/MYLANTA) 200-200-20 MG/5ML suspension 30 mL  30 mL Oral Q4H PRN Clapacs, John T, MD      . ARIPiprazole (ABILIFY) tablet 20 mg  20 mg Oral Daily Clapacs, [name], MD   20 mg at 05/05/18 0756  . [START ON 06/01/2018] ARIPiprazole ER (ABILIFY MAINTENA) injection 400 mg  400 mg Intramuscular Q28 days Pucilowska, Jolanta B, MD      . hydrOXYzine (ATARAX/VISTARIL) tablet 25 mg  25 mg Oral TID PRN Clapacs, John T, MD      . magnesium hydroxide (MILK OF MAGNESIA) suspension 30 mL  30 mL Oral Daily PRN Clapacs, John T, MD      . QUEtiapine (SEROQUEL) tablet 100 mg  100 mg Oral QHS Clapacs, John T, MD   100 mg at 05/04/18 2057  . venlafaxine XR (EFFEXOR-XR) 24 hr capsule 150 mg  150 mg Oral Q breakfast Clapacs, [name], MD   150 mg at 05/05/18 0757   PTA Medications: Medications Prior to Admission  Medication Sig Dispense Refill Last Dose  . metroNIDAZOLE (FLAGYL) 500 MG tablet Take 1 tablet (500 mg total) by mouth every 12 (twelve) hours. 10 tablet 0   . [DISCONTINUED] ARIPiprazole (ABILIFY) 20 MG tablet Take 1 tablet (20 mg total) by mouth daily. 30 tablet 1   . [DISCONTINUED] ARIPiprazole ER 400 MG SRER Inject 400 mg into the muscle every 28 (twenty-eight) days. 1 each 1   . [DISCONTINUED] QUEtiapine (SEROQUEL) 100 MG tablet  Take 1 tablet (100 mg total) by mouth at bedtime. 30 tablet 1   . [DISCONTINUED] venlafaxine XR (EFFEXOR-XR) 150 MG 24 hr capsule Take 1 capsule (150 mg total) by mouth daily with breakfast. 30 capsule 1     Patient Stressors: Medication change or noncompliance Substance abuse  Patient Strengths: Active sense of humor Average or above average intelligence Capable of independent living  Treatment Modalities: Medication Management, Group therapy, Case management,  1 to 1 session with clinician, Psychoeducation, Recreational therapy.   Physician Treatment Plan for Primary Diagnosis: Schizoaffective disorder, bipolar type (HCC) Long Term Goal(s): Improvement in symptoms so as ready for discharge Improvement in symptoms so as ready for discharge   Short Term Goals: Ability to identify changes in lifestyle to reduce recurrence of condition will improve Ability to verbalize feelings will improve Ability to disclose and discuss suicidal ideas Ability to demonstrate self-control will improve Ability to identify and develop effective coping behaviors will improve Ability to maintain clinical measurements within normal limits will improve Compliance with prescribed medications will improve Ability to identify triggers associated with substance abuse/mental health issues will improve Ability to identify changes in lifestyle to reduce recurrence of condition will improve Ability to demonstrate self-control will improve Ability to identify triggers associated with substance abuse/mental health issues will improve  Medication Management: Evaluate patient's response, side effects, and tolerance of medication regimen.  Therapeutic Interventions: 1  to 1 sessions, Unit Group sessions and Medication administration.  Evaluation of Outcomes: Progressing  Physician Treatment Plan for Secondary Diagnosis: Principal Problem:   Schizoaffective disorder, bipolar type (HCC) Active Problems:   Tobacco use  disorder   Cocaine use disorder, severe, dependence (HCC)   Cannabis use disorder, moderate, dependence (HCC)  Long Term Goal(s): Improvement in symptoms so as ready for discharge Improvement in symptoms so as ready for discharge   Short Term Goals: Ability to identify changes in lifestyle to reduce recurrence of condition will improve Ability to verbalize feelings will improve Ability to disclose and discuss suicidal ideas Ability to demonstrate self-control will improve Ability to identify and develop effective coping behaviors will improve Ability to maintain clinical measurements within normal limits will improve Compliance with prescribed medications will improve Ability to identify triggers associated with substance abuse/mental health issues will improve Ability to identify changes in lifestyle to reduce recurrence of condition will improve Ability to demonstrate self-control will improve Ability to identify triggers associated with substance abuse/mental health issues will improve     Medication Management: Evaluate patient's response, side effects, and tolerance of medication regimen.  Therapeutic Interventions: 1 to 1 sessions, Unit Group sessions and Medication administration.  Evaluation of Outcomes: Progressing   RN Treatment Plan for Primary Diagnosis: Schizoaffective disorder, bipolar type (HCC) Long Term Goal(s): Knowledge of disease and therapeutic regimen to maintain health will improve  Short Term Goals: Ability to verbalize feelings will improve, Ability to identify and develop effective coping behaviors will improve and Compliance with prescribed medications will improve  Medication Management: RN will administer medications as ordered by provider, will assess and evaluate patient's response and provide education to patient for prescribed medication. RN will report any adverse and/or side effects to prescribing provider.  Therapeutic Interventions: 1 on 1  counseling sessions, Psychoeducation, Medication administration, Evaluate responses to treatment, Monitor vital signs and CBGs as ordered, Perform/monitor CIWA, COWS, AIMS and Fall Risk screenings as ordered, Perform wound care treatments as ordered.  Evaluation of Outcomes: Progressing   LCSW Treatment Plan for Primary Diagnosis: Schizoaffective disorder, bipolar type (HCC) Long Term Goal(s): Safe transition to appropriate next level of care at discharge, Engage patient in therapeutic group addressing interpersonal concerns.  Short Term Goals: Engage patient in aftercare planning with referrals and resources, Increase social support, Facilitate acceptance of mental health diagnosis and concerns, Facilitate patient progression through stages of change regarding substance use diagnoses and concerns, Identify triggers associated with mental health/substance abuse issues and Increase skills for wellness and recovery  Therapeutic Interventions: Assess for all discharge needs, 1 to 1 time with Social worker, Explore available resources and support systems, Assess for adequacy in community support network, Educate family and significant other(s) on suicide prevention, Complete Psychosocial Assessment, Interpersonal group therapy.  Evaluation of Outcomes: Progressing   Progress in Treatment: Attending groups: Yes. Participating in groups: Yes. Taking medication as prescribed: Yes. Toleration medication: Yes. Family/Significant other contact made: No, will contact:  Patient declined family contact Patient understands diagnosis: Yes. Discussing patient identified problems/goals with staff: Yes. Medical problems stabilized or resolved: Yes. Denies suicidal/homicidal ideation: Yes. Issues/concerns per patient self-inventory: No. Other:   New problem(s) identified: No, Describe:  None  New Short Term/Long Term Goal(s): "To go home."  Discharge Plan or Barriers: To discharge home and follow up  with [facility] for outpatient services.  Reason for Continuation of Hospitalization: Medication stabilization  Estimated Length of Stay: Discharge today  Attendees: Patient: [name] Boonstra 05/05/2018 11:15 AM  Physician:  Dr. Jennet Maduro, MD 05/05/2018 11:15 AM  Nursing: Hulan Amato, RN 05/05/2018 11:15 AM  RN Care Manager: 05/05/2018 11:15 AM  Social Worker: Johny Shears, LCSWA 05/05/2018 11:15 AM  Recreational Therapist: Danella Deis. Outlaw CTRS, LRT 05/05/2018 11:15 AM  Other: Heidi Dach, LCSW 05/05/2018 11:15 AM  Other: Jake Shark, LCSW 05/05/2018 11:15 AM  Other: 05/05/2018 11:15 AM    Scribe for Treatment Team: Johny Shears, LCSW 05/05/2018 11:15 AM

## 2018-05-05 NOTE — Progress Notes (Signed)
  [facility] Adult Case Management Discharge Plan :  Will you be returning to the same living situation after discharge:  Yes,  Staying with a friend in [place] At discharge, do you have transportation home?: Yes,  Link bus Do you have the ability to pay for your medications: Yes,  Referred to a provider who can assist  Release of information consent forms completed and in the chart;  Patient's signature needed at discharge.  Patient to Follow up at: Follow-up Information    [facility], [facility]. Go on 05/06/2018.   Why:  Please attend your follow up appointment on Thursday 05/06/2018 at 9am. Thank you. Contact information: [address] [place] [address] [phone]           Next level of care provider has access to [facility] Link:no  Safety Planning and Suicide Prevention discussed: Yes,  Completed with patient  Have you used any form of tobacco in the last 30 days? (Cigarettes, Smokeless Tobacco, Cigars, and/or Pipes): Yes  Has patient been referred to the Quitline?: Patient refused referral  Patient has been referred for addiction treatment: Yes  Johny Shears, LCSW 05/05/2018, 8:51 AM

## 2018-05-05 NOTE — Progress Notes (Signed)
Recreation Therapy Notes  Date: 05/05/2018  Time: 9:30 am   Location: Craft Room   Behavioral response: N/A   Intervention Topic: Stress  Discussion/Intervention: Patient did not attend group.   Clinical Observations/Feedback:  Patient did not attend group.   Kincaid Tiger LRT/CTRS        Starling Jessie 05/05/2018 12:01 PM 

## 2018-05-05 NOTE — Progress Notes (Addendum)
Patient alert and oriented x 4, ambulates the unit with a steady gait. Denies SI/HI/AVH and pain at this time. Compliant with medications and meals. Patient is scheduled for discharge today. Patient picked up by her mother on above date and time to be transported home. Patient verbaliized understanding discharge instructions provided by RN. Patient departed with personal items, 7 day supply of medications, Rx's and suitcase with personal items inside. No complaints.

## 2018-08-28 ENCOUNTER — Encounter: Payer: Self-pay | Admitting: Emergency Medicine

## 2018-08-28 ENCOUNTER — Emergency Department
Admission: EM | Admit: 2018-08-28 | Discharge: 2018-08-29 | Disposition: A | Discharge status: Home / Self Care | Payer: Medicaid Other | Attending: Emergency Medicine | Admitting: Emergency Medicine

## 2018-08-28 ENCOUNTER — Other Ambulatory Visit: Payer: Self-pay

## 2018-08-28 ENCOUNTER — Emergency Department: Payer: Medicaid Other

## 2018-08-28 DIAGNOSIS — J45909 Unspecified asthma, uncomplicated: Secondary | ICD-10-CM | POA: Diagnosis not present

## 2018-08-28 DIAGNOSIS — Z79899 Other long term (current) drug therapy: Secondary | ICD-10-CM | POA: Diagnosis not present

## 2018-08-28 DIAGNOSIS — F17210 Nicotine dependence, cigarettes, uncomplicated: Secondary | ICD-10-CM | POA: Insufficient documentation

## 2018-08-28 DIAGNOSIS — F29 Unspecified psychosis not due to a substance or known physiological condition: Secondary | ICD-10-CM | POA: Insufficient documentation

## 2018-08-28 DIAGNOSIS — F1910 Other psychoactive substance abuse, uncomplicated: Secondary | ICD-10-CM | POA: Insufficient documentation

## 2018-08-28 DIAGNOSIS — R440 Auditory hallucinations: Secondary | ICD-10-CM | POA: Diagnosis present

## 2018-08-28 DIAGNOSIS — Z046 Encounter for general psychiatric examination, requested by authority: Secondary | ICD-10-CM | POA: Diagnosis not present

## 2018-08-28 LAB — URINE DRUG SCREEN, QUALITATIVE (ARMC ONLY)
Amphetamines, Ur Screen: NOT DETECTED
BARBITURATES, UR SCREEN: NOT DETECTED
CANNABINOID 50 NG, UR ~~LOC~~: POSITIVE — AB
Cocaine Metabolite,Ur ~~LOC~~: POSITIVE — AB
MDMA (Ecstasy)Ur Screen: NOT DETECTED
METHADONE SCREEN, URINE: NOT DETECTED
Opiate, Ur Screen: NOT DETECTED
Phencyclidine (PCP) Ur S: NOT DETECTED
TRICYCLIC, UR SCREEN: NOT DETECTED

## 2018-08-28 LAB — CBC
HCT: 39.4 % (ref 35.0–47.0)
HEMOGLOBIN: 13.2 g/dL (ref 12.0–16.0)
MCH: 32 pg (ref 26.0–34.0)
MCHC: 33.5 g/dL (ref 32.0–36.0)
MCV: 95.4 fL (ref 80.0–100.0)
PLATELETS: 229 10*3/uL (ref 150–440)
RBC: 4.13 MIL/uL (ref 3.80–5.20)
RDW: 13.3 % (ref 11.5–14.5)
WBC: 5.5 10*3/uL (ref 3.6–11.0)

## 2018-08-28 LAB — URINALYSIS, COMPLETE (UACMP) WITH MICROSCOPIC
Bacteria, UA: NONE SEEN
Bilirubin Urine: NEGATIVE
GLUCOSE, UA: NEGATIVE mg/dL
HGB URINE DIPSTICK: NEGATIVE
KETONES UR: NEGATIVE mg/dL
Leukocytes, UA: NEGATIVE
NITRITE: NEGATIVE
Protein, ur: NEGATIVE mg/dL
SPECIFIC GRAVITY, URINE: 1.026 (ref 1.005–1.030)
pH: 6 (ref 5.0–8.0)

## 2018-08-28 LAB — COMPREHENSIVE METABOLIC PANEL
ALK PHOS: 43 U/L (ref 38–126)
ALT: 11 U/L (ref 0–44)
ANION GAP: 9 (ref 5–15)
AST: 19 U/L (ref 15–41)
Albumin: 4 g/dL (ref 3.5–5.0)
BILIRUBIN TOTAL: 1.3 mg/dL — AB (ref 0.3–1.2)
BUN: 17 mg/dL (ref 6–20)
CALCIUM: 8.9 mg/dL (ref 8.9–10.3)
CO2: 23 mmol/L (ref 22–32)
Chloride: 109 mmol/L (ref 98–111)
Creatinine, Ser: 0.8 mg/dL (ref 0.44–1.00)
GFR calc non Af Amer: >60 mL/min (ref >60)
Glucose, Bld: 102 mg/dL — ABNORMAL HIGH (ref 70–99)
Potassium: 3.6 mmol/L (ref 3.5–5.1)
Sodium: 141 mmol/L (ref 135–145)
TOTAL PROTEIN: 7.1 g/dL (ref 6.5–8.1)

## 2018-08-28 LAB — PREGNANCY, URINE: PREG TEST UR: NEGATIVE

## 2018-08-28 LAB — SALICYLATE LEVEL

## 2018-08-28 LAB — ACETAMINOPHEN LEVEL

## 2018-08-28 LAB — ETHANOL: Alcohol, Ethyl (B): <10 mg/dL (ref <10)

## 2018-08-28 NOTE — ED Notes (Signed)
Patient assigned to appropriate care area   Introduced self to pt  Patient oriented to unit/care area: Informed that, for their safety, care areas are designed for safety and visiting and phone hours explained to patient. Patient verbalizes understanding, and verbal contract for safety obtained  Environment secured      Patient arrived with IVC paperwork that states "Respondent has prior history off her meds. Delusional.  Hearing Voices."  Denies SI/HI. Patient states she has been out of her medication "since she was born".  States "no one is intelligent enough to treat me".  Admits to auditory of visual hallucinations.

## 2018-08-28 NOTE — ED Triage Notes (Signed)
Arrives with BPD with IVC paperwork that states "Respondent has prior history off her meds.  Delusional.  Hearing Voices."  Denies SI/HI.  Patient states she has been out of her medication "since she was born".  States "no one is intelligent enough to treat me".  Admits to auditory of visual hallucinations.  Calm and cooperative at this time.

## 2018-08-28 NOTE — ED Notes (Signed)
[facility]  [phone]

## 2018-08-28 NOTE — ED Notes (Signed)
While XR tech was in rm preforming XR, he noticed that pt has on a bra. Pt was told several times that she needed to remove her bra because she could not have any of her belongings in this area. Pt stated "I am not taking off my bra." pt aware that it is for her safety and safety of other. Pt then stated "I am not going to hurt myself. Put a sitter with me to watch me. I am here because I am hearing voices." Brandy,RN and [facility] Surgery[facility]adeka,RN aware.

## 2018-08-28 NOTE — BH Assessment (Signed)
[facility].

## 2018-08-28 NOTE — ED Provider Notes (Signed)
-----------------------------------------   8:14 PM on 08/28/2018 -----------------------------------------   Blood pressure 128/74, pulse 89, temperature 98.6 F (37 C), temperature source Oral, resp. rate 16, height 5\' 6"  (1.676 m), weight 61.2 kg, last menstrual period 08/24/2018, SpO2 97 %.  Patient at this time is calm and easily awoken.  However, she intermittently says that she is hearing voices that are "screams."  She denies any suicidal or homicidal ideation.  However, she states that she does not feel okay going home right now and has a very odd affect.  Plan will be to reconsult tele-psych tomorrow morning.  Possible cocaine washout with a drug-induced psychosis.     Myrna BlazerSchaevitz, Tavia Stave Matthew, MD 08/28/18 2015

## 2018-08-28 NOTE — ED Notes (Addendum)
Pt given lunch tray and more warm blankets.

## 2018-08-28 NOTE — ED Notes (Signed)
Pt. Currently sleeping in bed. 

## 2018-08-28 NOTE — ED Notes (Signed)
Pt given warm blanket.

## 2018-08-28 NOTE — ED Notes (Signed)
Patient took her gray sports bra off after several attempts by Clinical research associatewriter, Press photographercharge nurse, and police officers. Wallace CullensGray sports bra placed in patients pink bag. Patient was given 2 pair of hospital issued underwear and she put those on and used as bra, with an extra scrub top.

## 2018-08-28 NOTE — Discharge Instructions (Addendum)
Please return here for any further problems.  Especially return if you think you might hurt yourself or if you begin hearing voices again.  Please take all your medicines as prescribed.  Please follow-up with [facility].  You can try Narcotics Anonymous to help you with the drugs.

## 2018-08-28 NOTE — ED Provider Notes (Addendum)
[facility] Emergency Department Provider Note   ____________________________________________   First MD Initiated Contact with Patient 08/28/18 1241     (approximate)  I have reviewed the triage vital signs and the nursing notes.   HISTORY  Chief Complaint Mental Health Problem    HP [name] is a 27 y.o. female patient comes in under commitment.  Patient reports to me that she is hearing voices she cannot say to me what they are.  All at their same.  Patient has previously said she had a history of auditory and visual hallucinations but denied SI or HI.  Patient currently is responding to internal stimuli.  She also tells me she is having a cough productive of black phlegm and is chilled.  We will get a chest x-ray to evaluate this and her lab work.   Past Medical History:  Diagnosis Date  . Asthma   . Depression 2009   Inpatient psych admission for SI, dissociative fugue  . Dissociative disorder or reaction 2009  . Eczema   . H/O: suicide attempt   . ODD (oppositional defiant disorder)   . PTSD (post-traumatic stress disorder)   . Schizoaffective disorder (HCC)   . Substance abuse [facility])     Patient Active Problem List   Diagnosis Date Noted  . Schizoaffective disorder, bipolar type (HCC) 05/03/2018  . Bacterial vaginosis 12/25/2017  . PTSD (post-traumatic stress disorder) 09/02/2017  . Bipolar disorder, curr episode mixed, severe, with psychotic features (HCC) 09/02/2017  . MRSA carrier 07/18/2017  . Bipolar I disorder, most recent episode mixed, severe with psychotic features (HCC) 07/18/2017  . Overdose 07/16/2017  . Aspiration pneumonia (HCC) 07/16/2017  . Acute respiratory failure (HCC) 07/16/2017  . Polysubstance abuse (HCC) 07/16/2017  . Eczema 05/08/2017  . Borderline personality disorder (HCC) 11/06/2016  . Cocaine use disorder, severe, dependence (HCC) 11/06/2016  . Cannabis use disorder, moderate, dependence (HCC)  11/06/2016  . Alcohol use disorder, mild, abuse 11/06/2016  . Asthma 09/23/2011  . Tobacco use disorder 09/15/2009    Past Surgical History:  Procedure Laterality Date  . ADENOIDECTOMY    . TONSILLECTOMY      Prior to Admission medications   Medication Sig Start Date End Date Taking? Authorizing Provider  ARIPiprazole (ABILIFY) 20 MG tablet Take 1 tablet (20 mg total) by mouth daily. 05/05/18   Pucilowska, Jolanta B, MD  ARIPiprazole ER (ABILIFY MAINTENA) 400 MG SRER injection Inject 2 mLs (400 mg total) into the muscle every 28 (twenty-eight) days. 06/01/18   Pucilowska, Jolanta B, MD  QUEtiapine (SEROQUEL) 100 MG tablet Take 1 tablet (100 mg total) by mouth at bedtime. 05/05/18   Pucilowska, Braulio Conte B, MD  venlafaxine XR (EFFEXOR-XR) 150 MG 24 hr capsule Take 1 capsule (150 mg total) by mouth daily with breakfast. 05/05/18   Pucilowska, Ellin Goodie, MD    Allergies Banana  Family History  Adopted: Yes  Problem Relation Age of Onset  . Mental illness Mother   . Mental illness Father     Social History Social History   Tobacco Use  . Smoking status: Current Every Day Smoker    Packs/day: 0.25    Years: 2.00    Pack years: 0.50    Types: Cigarettes  . Smokeless tobacco: Never Used  Substance Use Topics  . Alcohol use: Yes    Alcohol/week: 1.0 standard drinks    Types: 1 Cans of beer per week    Comment: weekly  . Drug use: Yes  Types: Marijuana, "Crack" cocaine, Heroin    Comment: 4 grams daily    Review of Systems  Constitutional: No fever/chills but feels cold Eyes: No visual changes. ENT: No sore throat. Cardiovascular: Denies chest pain. Respiratory: Denies shortness of breath. Gastrointestinal: No abdominal pain.  No nausea, no vomiting.  No diarrhea.  No constipation. Genitourinary: Negative for dysuria. Musculoskeletal: Negative for back pain. Skin: Negative for rash. Neurological: Negative for headaches, focal weakness    ____________________________________________   PHYSICAL EXAM:  VITAL SIGNS: ED Triage Vitals  Enc Vitals Group     BP 08/28/18 1155 128/74     Pulse Rate 08/28/18 1155 89     Resp 08/28/18 1155 16     Temp 08/28/18 1155 98.6 F (37 C)     Temp Source 08/28/18 1155 Oral     SpO2 08/28/18 1155 97 %     Weight 08/28/18 1152 135 lb (61.2 kg)     Height 08/28/18 1152 5\' 6"  (1.676 m)     Head Circumference --      Peak Flow --      Pain Score 08/28/18 1152 0     Pain Loc --      Pain Edu? --      Excl. in GC? --     Constitutional: Alert and oriented. Well appearing and in no acute distress but responding to internal stimuli. Eyes: Conjunctivae are normal. Head: Atraumatic. Nose: No congestion/rhinnorhea. Mouth/Throat: Mucous membranes are moist.  Oropharynx non-erythematous. Neck: No stridor. Cardiovascular: Normal rate, regular rhythm. Grossly normal heart sounds.  Good peripheral circulation. Respiratory: Normal respiratory effort.  No retractions. Lungs CTAB. Gastrointestinal: Soft and nontender. No distention. No abdominal bruits. No CVA tenderness. Musculoskeletal: No lower extremity tenderness nor edema.   Neurologic:  Normal speech and language. No gross focal neurologic deficits are appreciated.  Skin:  Skin is warm, dry and intact. No rash noted.   ____________________________________________   LABS (all labs ordered are listed, but only abnormal results are displayed)  Labs Reviewed  COMPREHENSIVE METABOLIC PANEL - Abnormal; Notable for the following components:      Result Value   Glucose, Bld 102 (*)    Total Bilirubin 1.3 (*)    All other components within normal limits  ACETAMINOPHEN LEVEL - Abnormal; Notable for the following components:   Acetaminophen (Tylenol), Serum <10 (*)    All other components within normal limits  ETHANOL  SALICYLATE LEVEL  CBC  URINE DRUG SCREEN, QUALITATIVE ([facility] ONLY)  POC URINE PREG, ED    ____________________________________________  EKG   ____________________________________________  RADIOLOGY  ED MD interpretation: Chest x-ray read by radiology reviewed by me is clear  Official radiology report(s): Dg Chest Portable 1 View  Result Date: 08/28/2018 CLINICAL DATA:  Productive cough beginning today. EXAM: PORTABLE CHEST 1 VIEW COMPARISON:  Single-view of the chest 07/16/2017. PA and lateral chest 03/13/2017. FINDINGS: Lungs clear. Heart size normal. No pneumothorax or pleural fluid. No acute or focal bony abnormality. IMPRESSION: Negative chest. Electronically Signed   By: Drusilla Kannerhomas  Dalessio M.D.   On: 08/28/2018 13:28    ____________________________________________   PROCEDURES  Procedure(s) performed:   Procedures  Critical Care performed:   ____________________________________________   INITIAL IMPRESSION / ASSESSMENT AND PLAN / ED COURSE         ____________________________________________   FINAL CLINICAL IMPRESSION(S) / ED DIAGNOSES  Final diagnoses:  Psychosis, unspecified psychosis type [facility](HCC)     ED Discharge Orders    None  Note:  This document was prepared using Dragon voice recognition software and may include unintentional dictation errors.    Arnaldo Natal, MD 08/28/18 1544 Patient tells SOC that she is not hearing voices she had previously told me in one other nurse here that she was.  He told associate that she did a lot of cocaine last night.  SOC has reversed the commitment.  Patient is now sleeping.  I will let her continue to sleep and when she wakes up we will ask her again if she is hearing voices.  We will check the UA and UDS and urine pregnancy which she has not provided to [place].  If her history remains consistent with what she tolD SOC we will release her and have her follow-up with [facility] and Narcotics Anonymous.  If she begins hearing voices again then we will likely renew the commitment.  Her chest x-ray is  negative.  She has not been coughing here.  I will not pursue antibiotics.  Review of her records shows that she should have enough medication for a few more months.   Arnaldo Natal, MD 08/28/18 [phone]

## 2018-08-29 NOTE — ED Notes (Signed)
Patient discharged home with mom. Patient alert and oriented. Patient AVS/Follow-up Appointment reviewed with her and mother and she verbalized understanding. Vitals  112/57-77-18-100% room air. All patient belongings returned to her. Patient left ambulatory with mom [name].

## 2018-08-29 NOTE — ED Provider Notes (Signed)
-----------------------------------------   7:54 AM on 08/29/2018 -----------------------------------------   Blood pressure 101/68, pulse 78, temperature 98.4 F (36.9 C), temperature source Oral, resp. rate 18, height 5\' 6"  (1.676 m), weight 61.2 kg, last menstrual period 08/24/2018, SpO2 99 %.  The patient had no acute events since last update.  Calm and cooperative at this time.  Disposition is pending repeat evaluation with tele-psychiatry     Sharyn Creamer, MD 08/29/18 [phone]

## 2018-08-29 NOTE — ED Notes (Signed)
Patient is alert and verbal. Patient denies SI/HI and A/V hallucinations at this time. Patient is calm and cooperative. Patient is currently resting in bed with eyes opened. Patient informed of discharge and was given phone to call for a ride home per her request. Patient with Q 15 minute checks in progress and remains safe on unit. Monitoring continues.

## 2018-08-29 NOTE — ED Provider Notes (Signed)
Received consultation from tele-psychiatrist.  They advised the patient may be discharged and may follow-up with outpatient resources which have been provided.  Patient discharged.  Vitals:   08/28/18 1155 08/29/18 0651  BP: 128/74 101/68  Pulse: 89 78  Resp: 16 18  Temp: 98.6 F (37 C) 98.4 F (36.9 C)  SpO2: 97% 99%    She is alert, in no distress.  Able to ambulate without difficulty.  Calm affect and demeanor, appears appropriate for discharge.   Sharyn Creamer, MD 08/29/18 [phone]

## 2018-08-29 NOTE — ED Notes (Signed)
SOC set up in patients room.  Video and audio checked.

## 2018-08-29 NOTE — ED Notes (Signed)
Pt. Requested a drink, pt. Given drink.

## 2018-09-11 DIAGNOSIS — F1590 Other stimulant use, unspecified, uncomplicated: Secondary | ICD-10-CM | POA: Diagnosis present

## 2018-09-26 ENCOUNTER — Emergency Department
Admission: EM | Admit: 2018-09-26 | Discharge: 2018-09-28 | Disposition: A | Discharge status: Home / Self Care | Payer: Medicaid Other | Attending: Emergency Medicine | Admitting: Emergency Medicine

## 2018-09-26 ENCOUNTER — Encounter: Payer: Self-pay | Admitting: Medical Oncology

## 2018-09-26 DIAGNOSIS — F17210 Nicotine dependence, cigarettes, uncomplicated: Secondary | ICD-10-CM | POA: Diagnosis not present

## 2018-09-26 DIAGNOSIS — F29 Unspecified psychosis not due to a substance or known physiological condition: Secondary | ICD-10-CM | POA: Insufficient documentation

## 2018-09-26 DIAGNOSIS — J45909 Unspecified asthma, uncomplicated: Secondary | ICD-10-CM | POA: Insufficient documentation

## 2018-09-26 DIAGNOSIS — F259 Schizoaffective disorder, unspecified: Secondary | ICD-10-CM | POA: Insufficient documentation

## 2018-09-26 DIAGNOSIS — F23 Brief psychotic disorder: Secondary | ICD-10-CM

## 2018-09-26 DIAGNOSIS — F4310 Post-traumatic stress disorder, unspecified: Secondary | ICD-10-CM | POA: Diagnosis present

## 2018-09-26 DIAGNOSIS — F19959 Other psychoactive substance use, unspecified with psychoactive substance-induced psychotic disorder, unspecified: Secondary | ICD-10-CM | POA: Diagnosis present

## 2018-09-26 DIAGNOSIS — Z79899 Other long term (current) drug therapy: Secondary | ICD-10-CM | POA: Diagnosis not present

## 2018-09-26 DIAGNOSIS — R451 Restlessness and agitation: Secondary | ICD-10-CM | POA: Diagnosis present

## 2018-09-26 DIAGNOSIS — F1420 Cocaine dependence, uncomplicated: Secondary | ICD-10-CM | POA: Diagnosis present

## 2018-09-26 DIAGNOSIS — F250 Schizoaffective disorder, bipolar type: Secondary | ICD-10-CM | POA: Diagnosis not present

## 2018-09-26 DIAGNOSIS — L309 Dermatitis, unspecified: Secondary | ICD-10-CM | POA: Diagnosis present

## 2018-09-26 DIAGNOSIS — F1220 Cannabis dependence, uncomplicated: Secondary | ICD-10-CM | POA: Diagnosis present

## 2018-09-26 DIAGNOSIS — F17200 Nicotine dependence, unspecified, uncomplicated: Secondary | ICD-10-CM | POA: Diagnosis present

## 2018-09-26 MED ORDER — LORAZEPAM 2 MG/ML IJ SOLN
1.0000 mg | Freq: Once | INTRAMUSCULAR | Status: AC
  Administered 2018-09-26: 1 mg via INTRAMUSCULAR
  Filled 2018-09-26: qty 1

## 2018-09-26 MED ORDER — DIPHENHYDRAMINE HCL 50 MG/ML IJ SOLN
25.0000 mg | Freq: Once | INTRAMUSCULAR | Status: AC
  Administered 2018-09-26: 25 mg via INTRAMUSCULAR
  Filled 2018-09-26: qty 1

## 2018-09-26 MED ORDER — ZIPRASIDONE MESYLATE 20 MG IM SOLR
10.0000 mg | Freq: Once | INTRAMUSCULAR | Status: AC
  Administered 2018-09-26: 10 mg via INTRAMUSCULAR
  Filled 2018-09-26: qty 20

## 2018-09-26 NOTE — ED Notes (Signed)
Report to include Situation, Background, Assessment, and Recommendations received from Amy B. RN. Patient alert and oriented, warm and dry, in no acute distress. Patient denies SI, HI, AVH and pain. Patient made aware of Q15 minute rounds and Rover and Officer presence for their safety. Patient instructed to come to me with needs or concerns.  

## 2018-09-26 NOTE — ED Notes (Signed)
Patient asleep in room. No noted distress or abnormal behavior. Will continue 15 minute checks and observation by security cameras for safety. 
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
## 2018-09-26 NOTE — ED Notes (Addendum)
Patient Belongings: 1 pair of jean shorts 1 white shirt 1 multi- color tank top 1 gray bra 1 pair of purple shorts Black sandals  1 Black Cell Phone  1 green lighter $14.00  ((1)Five Dollar bill, and (9) dollar bills) Counted by this tech and RN [employer] These Items were given to [employer] to be placed in the safe at 0940.

## 2018-09-26 NOTE — ED Notes (Signed)
Pt continues to be uncooperative. Remains in handcuffs due to aggression. IM medications administered as ordered. Pt still in street clothes and had her phone in her possession. Pt attempted to make phone call and phone taken by officer. Officers standing by room.   Maintained on 15 minute checks and observation by security camera for safety.

## 2018-09-26 NOTE — ED Notes (Signed)
Hourly rounding reveals patient sleeping in room. No complaints, stable, in no acute distress. Q15 minute rounds and monitoring via Rover and Officer to continue.  
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
## 2018-09-26 NOTE — ED Notes (Signed)
TTS was unable to assess at this time.  Patient is unable to be aroused to participate in the assessment.

## 2018-09-26 NOTE — ED Notes (Signed)
[name] came to hospital to get cell phone.  Patient's mother claimed the phone the patient had is hers, not the patient's.  RN and tech unable to be arouse patient. Could not obtain verbal consent from patient to give mother the cell phone.  Situation explained to patient's mother.  Mother asked for patient to contact her when she wakes [phone].

## 2018-09-26 NOTE — ED Notes (Signed)
2 attempts made to draw patient's labs. Pt was uncooperative and attempts were unsuccessful. Per EDP labs do not need to be drawn at this time.

## 2018-09-26 NOTE — ED Notes (Signed)
[name] [name] informed that pt is sleeping and stable. Mother states that she will go home at this time with pt's daughter. Ms Smith's numbers are as follows; home [phone]), new cell [phone]), and cell that is in locker here but linked with home phone [phone]).

## 2018-09-26 NOTE — BH Assessment (Signed)
Writer was unable to complete consult due to patient being sedated.

## 2018-09-26 NOTE — ED Notes (Addendum)
Hourly rounding reveals patient sleeping in room. No complaints, stable, in no acute distress. Q15 minute rounds and monitoring via Rover and Officer to continue.  

## 2018-09-26 NOTE — ED Notes (Signed)
Handcuffs removed.  Pt is laying on bed. Breakfast tray and drink given. Pt appears to be getting drowsy.   Maintained on 15 minute checks and observation by security camera for safety.

## 2018-09-27 ENCOUNTER — Encounter: Payer: Self-pay | Admitting: Psychiatry

## 2018-09-27 ENCOUNTER — Inpatient Hospital Stay: Admission: RE | Admit: 2018-09-27 | Payer: Medicaid Other | Source: Intra-hospital | Admitting: Psychiatry

## 2018-09-27 DIAGNOSIS — F250 Schizoaffective disorder, bipolar type: Secondary | ICD-10-CM

## 2018-09-27 MED ORDER — ARIPIPRAZOLE ER 400 MG IM SRER
400.0000 mg | INTRAMUSCULAR | Status: DC
  Administered 2018-09-27: 400 mg via INTRAMUSCULAR
  Filled 2018-09-27: qty 2

## 2018-09-27 MED ORDER — QUETIAPINE FUMARATE 25 MG PO TABS
100.0000 mg | ORAL_TABLET | Freq: Every day | ORAL | Status: DC
  Administered 2018-09-27: 100 mg via ORAL
  Filled 2018-09-27: qty 4

## 2018-09-27 MED ORDER — DULOXETINE HCL 30 MG PO CPEP
30.0000 mg | ORAL_CAPSULE | Freq: Every day | ORAL | Status: DC
  Administered 2018-09-27: 30 mg via ORAL
  Filled 2018-09-27 (×2): qty 1

## 2018-09-27 MED ORDER — ARIPIPRAZOLE 10 MG PO TABS
20.0000 mg | ORAL_TABLET | Freq: Every day | ORAL | Status: DC
  Administered 2018-09-27: 20 mg via ORAL
  Filled 2018-09-27 (×2): qty 2

## 2018-09-27 NOTE — ED Notes (Signed)
RN attempted to collect vital signs on patient.  Patient stated, "I don't want any of that done."

## 2018-09-27 NOTE — ED Notes (Signed)
Patient transferred to [facility] room 5
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
## 2018-09-27 NOTE — ED Notes (Signed)
Hourly rounding reveals patient in room. No complaints, stable, in no acute distress. Q15 minute rounds and monitoring via Security Cameras to continue. 

## 2018-09-27 NOTE — ED Notes (Signed)
Hourly rounding reveals patient sleeping in room. No complaints, stable, in no acute distress. Q15 minute rounds and monitoring via Rover and Officer to continue.  

## 2018-09-27 NOTE — ED Notes (Signed)
Hourly rounding reveals patient sleeping in room. No complaints, stable, in no acute distress. Q15 minute rounds and monitoring via Security Cameras to continue. 

## 2018-09-27 NOTE — BH Assessment (Signed)
Patient is to be admitted to [facility] by Dr. Jennet Maduro.  Attending Physician will be Dr. Jennet Maduro.   Patient has been assigned to room 315, by [facility] Charge Nurse [place] F.   Intake Paper Work has been signed and placed on patient chart.  ER staff is aware of the admission:  [facility] ER Secretary    Dr. Pershing Proud, ER MD   Prudencio Burly Patient's Nurse   Ethelene Browns Patient Access.

## 2018-09-27 NOTE — ED Notes (Signed)
BEHAVIORAL HEALTH ROUNDING Patient sleeping: No. Patient alert and oriented: yes Behavior appropriate: Yes.  ; If no, describe:  Nutrition and fluids offered: yes Toileting and hygiene offered: Yes  Sitter present: q15 minute observations and security monitoring Law enforcement present: Yes    

## 2018-09-27 NOTE — ED Notes (Signed)
Hourly rounding reveals patient in room. No complaints, stable, in no acute distress. Q15 minute rounds and monitoring via Rover and Officer to continue.   

## 2018-09-27 NOTE — ED Notes (Signed)
IVC/  PENDING  PLACEMENT 

## 2018-09-27 NOTE — ED Notes (Signed)
Snack and beverage given. 

## 2018-09-27 NOTE — ED Provider Notes (Signed)
-----------------------------------------   4:20 AM on 09/27/2018 -----------------------------------------   Blood pressure 103/68, pulse 80, temperature 97.7 F (36.5 C), temperature source Oral, resp. rate 16, height 5\' 6"  (1.676 m), weight 61 kg, SpO2 98 %.  The patient had no acute events since last update.  Calm and cooperative at this time.  Disposition is pending Psychiatry/Behavioral Medicine team recommendations.     Minna Antis, MD 09/27/18 [phone]

## 2018-09-27 NOTE — Consult Note (Signed)
  Patient will be admitted to psychiatry. Orders entered. Full note to follow.

## 2018-09-27 NOTE — ED Notes (Signed)
Patient eating breakfast. °

## 2018-09-27 NOTE — ED Notes (Signed)
[name] called and asked to speak to patient.  RN called pt's mother, Renea Ee, back and let her know that it was outside of phone hours.  [name] stated that she would not be at home in the morning and to let the patient know to call her on her new cell number- [phone].

## 2018-09-27 NOTE — BH Assessment (Signed)
Pending bed assignment

## 2018-09-27 NOTE — ED Notes (Signed)
Report to include Situation, Background, Assessment, and Recommendations received from Rhea RN. Patient alert and oriented, warm and dry, in no acute distress. Patient denies SI, HI, AVH and pain. Patient made aware of Q15 minute rounds and security cameras for their safety. Patient instructed to come to me with needs or concerns. 
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
## 2018-09-27 NOTE — ED Notes (Signed)
Patient told technician she did not want to move to the [facility], she does not like the B[facility]. Writer asked patient why she did not like the BHU, patient had no reason, explained to patient that it has been ordered to transfer her so that we may clear up beds for other patients. Patient did not like the idea and said "well go get me a wheelchair"

## 2018-09-28 NOTE — ED Notes (Signed)
Hourly rounding reveals patient sleeping in room. No complaints, stable, in no acute distress. Q15 minute rounds and monitoring via Security Cameras to continue. 

## 2018-09-28 NOTE — ED Provider Notes (Signed)
-----------------------------------------   1:13 PM on 09/28/2018 -----------------------------------------  By psychiatry for discharge they know her very well no SI no HI we will discharge, transfer safety, patient was medically cleared prior to my arrival.   Jeanmarie Plant, MD 09/28/18 1313

## 2018-09-28 NOTE — ED Notes (Signed)
Patient refused her morning medications Cymbalta and Abilify, patient stated "if the doctor lets me leave ill take them", patient feels we are holding her against her will and she is ready to go, patient does not want to be admitted to inpatient. Discussed with patient the consequences of not taking her medications such as longer stay. Patient continued to say " I want to leave, let me talk to the doctor".

## 2018-09-28 NOTE — ED Notes (Signed)
Patient discharged home, patient received discharge papers. Patient received belongings and verbalized she has received all of her belongings. Patient appropriate and cooperative, Denies SI/HI AVH. Vital signs taken. NAD noted. 

## 2018-09-28 NOTE — ED Provider Notes (Signed)
-----------------------------------------   6:36 AM on 09/28/2018 -----------------------------------------   Blood pressure 103/68, pulse 80, temperature 97.7 F (36.5 C), temperature source Oral, resp. rate 16, height 1.676 m (5\' 6" ), weight 61 kg, SpO2 98 %.  The patient had no acute events since last update.  Calm and cooperative at this time.  Awaiting a bed downstairs.    Loleta Rose, MD 09/28/18 [phone]
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
## 2018-12-04 ENCOUNTER — Other Ambulatory Visit: Payer: Self-pay

## 2018-12-04 ENCOUNTER — Encounter: Payer: Self-pay | Admitting: Behavioral Health

## 2018-12-04 ENCOUNTER — Emergency Department
Admission: EM | Admit: 2018-12-04 | Discharge: 2018-12-04 | Disposition: A | Discharge status: Other Acute Inpatient Hospital | Payer: Medicaid Other | Attending: Emergency Medicine | Admitting: Emergency Medicine

## 2018-12-04 ENCOUNTER — Inpatient Hospital Stay
Admission: AD | Admit: 2018-12-04 | Discharge: 2018-12-07 | DRG: 885 | Disposition: A | Discharge status: Home / Self Care | Payer: Medicaid Other | Source: Intra-hospital | Attending: Psychiatry | Admitting: Psychiatry

## 2018-12-04 DIAGNOSIS — F17210 Nicotine dependence, cigarettes, uncomplicated: Secondary | ICD-10-CM | POA: Diagnosis present

## 2018-12-04 DIAGNOSIS — F4010 Social phobia, unspecified: Secondary | ICD-10-CM | POA: Diagnosis present

## 2018-12-04 DIAGNOSIS — Z818 Family history of other mental and behavioral disorders: Secondary | ICD-10-CM | POA: Diagnosis not present

## 2018-12-04 DIAGNOSIS — Z9119 Patient's noncompliance with other medical treatment and regimen: Secondary | ICD-10-CM | POA: Diagnosis not present

## 2018-12-04 DIAGNOSIS — F3164 Bipolar disorder, current episode mixed, severe, with psychotic features: Secondary | ICD-10-CM | POA: Diagnosis present

## 2018-12-04 DIAGNOSIS — F1490 Cocaine use, unspecified, uncomplicated: Secondary | ICD-10-CM | POA: Diagnosis not present

## 2018-12-04 DIAGNOSIS — F913 Oppositional defiant disorder: Secondary | ICD-10-CM | POA: Diagnosis present

## 2018-12-04 DIAGNOSIS — R451 Restlessness and agitation: Secondary | ICD-10-CM | POA: Diagnosis present

## 2018-12-04 DIAGNOSIS — F1420 Cocaine dependence, uncomplicated: Secondary | ICD-10-CM | POA: Diagnosis present

## 2018-12-04 DIAGNOSIS — F4310 Post-traumatic stress disorder, unspecified: Secondary | ICD-10-CM | POA: Diagnosis present

## 2018-12-04 DIAGNOSIS — F14229 Cocaine dependence with intoxication, unspecified: Secondary | ICD-10-CM | POA: Diagnosis present

## 2018-12-04 DIAGNOSIS — F250 Schizoaffective disorder, bipolar type: Secondary | ICD-10-CM | POA: Insufficient documentation

## 2018-12-04 DIAGNOSIS — Z79899 Other long term (current) drug therapy: Secondary | ICD-10-CM

## 2018-12-04 DIAGNOSIS — Z62810 Personal history of physical and sexual abuse in childhood: Secondary | ICD-10-CM | POA: Diagnosis present

## 2018-12-04 DIAGNOSIS — J45909 Unspecified asthma, uncomplicated: Secondary | ICD-10-CM | POA: Insufficient documentation

## 2018-12-04 DIAGNOSIS — F1220 Cannabis dependence, uncomplicated: Secondary | ICD-10-CM | POA: Diagnosis present

## 2018-12-04 DIAGNOSIS — F259 Schizoaffective disorder, unspecified: Secondary | ICD-10-CM

## 2018-12-04 DIAGNOSIS — F603 Borderline personality disorder: Secondary | ICD-10-CM | POA: Diagnosis present

## 2018-12-04 DIAGNOSIS — F17200 Nicotine dependence, unspecified, uncomplicated: Secondary | ICD-10-CM | POA: Diagnosis present

## 2018-12-04 LAB — COMPREHENSIVE METABOLIC PANEL
ALBUMIN: 3.5 g/dL (ref 3.5–5.0)
ALT: 14 U/L (ref 0–44)
AST: 29 U/L (ref 15–41)
Alkaline Phosphatase: 44 U/L (ref 38–126)
Anion gap: 7 (ref 5–15)
BUN: 17 mg/dL (ref 6–20)
CO2: 25 mmol/L (ref 22–32)
CREATININE: 0.78 mg/dL (ref 0.44–1.00)
Calcium: 8.6 mg/dL — ABNORMAL LOW (ref 8.9–10.3)
Chloride: 110 mmol/L (ref 98–111)
GFR calc Af Amer: >60 mL/min (ref >60)
Glucose, Bld: 105 mg/dL — ABNORMAL HIGH (ref 70–99)
Potassium: 3.4 mmol/L — ABNORMAL LOW (ref 3.5–5.1)
Sodium: 142 mmol/L (ref 135–145)
Total Bilirubin: 1.1 mg/dL (ref 0.3–1.2)
Total Protein: 6.7 g/dL (ref 6.5–8.1)

## 2018-12-04 LAB — URINE DRUG SCREEN, QUALITATIVE (ARMC ONLY)
Amphetamines, Ur Screen: NOT DETECTED
BENZODIAZEPINE, UR SCRN: NOT DETECTED
Barbiturates, Ur Screen: NOT DETECTED
Cannabinoid 50 Ng, Ur ~~LOC~~: POSITIVE — AB
Cocaine Metabolite,Ur ~~LOC~~: POSITIVE — AB
MDMA (Ecstasy)Ur Screen: NOT DETECTED
Methadone Scn, Ur: NOT DETECTED
Opiate, Ur Screen: NOT DETECTED
PHENCYCLIDINE (PCP) UR S: NOT DETECTED
Tricyclic, Ur Screen: NOT DETECTED

## 2018-12-04 LAB — CBC
HCT: 36.5 % (ref 36.0–46.0)
Hemoglobin: 11.6 g/dL — ABNORMAL LOW (ref 12.0–15.0)
MCH: 30.9 pg (ref 26.0–34.0)
MCHC: 31.8 g/dL (ref 30.0–36.0)
MCV: 97.1 fL (ref 80.0–100.0)
Platelets: 236 10*3/uL (ref 150–400)
RBC: 3.76 MIL/uL — ABNORMAL LOW (ref 3.87–5.11)
RDW: 12.9 % (ref 11.5–15.5)
WBC: 8.8 10*3/uL (ref 4.0–10.5)
nRBC: 0 % (ref 0.0–0.2)

## 2018-12-04 LAB — ETHANOL

## 2018-12-04 LAB — ACETAMINOPHEN LEVEL: Acetaminophen (Tylenol), Serum: <10 ug/mL — ABNORMAL LOW (ref 10–30)

## 2018-12-04 LAB — SALICYLATE LEVEL: Salicylate Lvl: <7 mg/dL (ref 2.8–30.0)

## 2018-12-04 MED ORDER — ALUM & MAG HYDROXIDE-SIMETH 200-200-20 MG/5ML PO SUSP: 30.0000 mL | ORAL | Status: DC | PRN

## 2018-12-04 MED ORDER — MAGNESIUM HYDROXIDE 400 MG/5ML PO SUSP: 30.0000 mL | Freq: Every day | ORAL | Status: DC | PRN

## 2018-12-04 MED ORDER — TRAZODONE HCL 50 MG PO TABS
50.0000 mg | ORAL_TABLET | Freq: Every evening | ORAL | Status: DC | PRN
  Administered 2018-12-04: 50 mg via ORAL
  Filled 2018-12-04: qty 1

## 2018-12-04 MED ORDER — HYDROXYZINE HCL 25 MG PO TABS
25.0000 mg | ORAL_TABLET | Freq: Three times a day (TID) | ORAL | Status: DC | PRN
  Administered 2018-12-04: 25 mg via ORAL
  Filled 2018-12-04: qty 1

## 2018-12-04 MED ORDER — ACETAMINOPHEN 325 MG PO TABS: 650.0000 mg | ORAL_TABLET | Freq: Four times a day (QID) | ORAL | Status: DC | PRN

## 2018-12-04 NOTE — [facility] Suicide Risk Assessment (Signed)
[facility]   Nursing information obtained from:  Patient Demographic factors:  Low socioeconomic status Current Mental Status:  Thoughts of violence towards others Loss Factors:  Legal issues Historical Factors:  Impulsivity Risk Reduction Factors:  Living with another person, especially a relative, Employed  Total Time spent with patient: 1 hour Principal Problem: <principal problem not specified> Diagnosis:  Active Problems:   Borderline personality disorder (HCC)   Cocaine use disorder, severe, dependence (HCC)  Subjective Data: see HPI  Continued Clinical Symptoms:    The "Alcohol Use Disorders Identification Test", Guidelines for Use in Primary Care, Second Edition.  World Science writerHealth Organization [facility](WHO). Score between 0-7:  no or low risk or alcohol related problems. Score between 8-15:  moderate risk of alcohol related problems. Score between 16-19:  high risk of alcohol related problems. Score 20 or above:  warrants further diagnostic evaluation for alcohol dependence and treatment.   CLINICAL FACTORS:   Depression:   Aggression Insomnia Severe   Musculoskeletal: Strength & Muscle Tone: within normal limits Gait & Station: normal Patient leans: N/A  Psychiatric Specialty Exam: Physical Exam  ROS  Blood pressure 94/66, pulse 72, temperature 97.6 F (36.4 C), temperature source Oral, resp. rate 18, height 5\' 2"  (1.575 m), weight 61.2 kg, SpO2 98 %.Body mass index is 24.69 kg/m.  See HPI    COGNITIVE FEATURES THAT CONTRIBUTE TO RISK:  None    SUICIDE RISK:   Minimal: No identifiable suicidal ideation.  Patients presenting with no risk factors but with morbid ruminations; may be classified as minimal risk based on the severity of the depressive symptoms  PLAN OF CARE: see HPI  I certify that inpatient services furnished can reasonably be expected to improve the patient's condition.   Marton Malizia, MD 12/04/2018, 4:12 PM

## 2018-12-04 NOTE — ED Notes (Signed)
Pt given meal tray and drink.

## 2018-12-04 NOTE — ED Notes (Signed)
BEHAVIORAL HEALTH ROUNDING Patient sleeping: No. Patient alert and oriented: yes Behavior appropriate: Yes.  ; If no, describe:  Nutrition and fluids offered: yes Toileting and hygiene offered: Yes  Sitter present: q15 minute observations and security  monitoring Law enforcement present: Yes  ODS  

## 2018-12-04 NOTE — ED Notes (Signed)
Patient observed lying in bed with eyes closed  Even, unlabored respirations observed   NAD pt appears to be sleeping  I will continue to monitor along with every 15 minute visual observations and ongoing security monitoring    

## 2018-12-04 NOTE — ED Triage Notes (Signed)
Altercation with family.

## 2018-12-04 NOTE — ED Notes (Signed)
[facility]   Pt awakened so that she will be ready for consult

## 2018-12-04 NOTE — ED Notes (Signed)
Key removed from [facility]is and sent with transporter and chart to [facility]

## 2018-12-04 NOTE — Tx Team (Signed)
Initial Treatment Plan 12/04/2018 5:31 PM [name] Karie KirksShayan Crites [id number]    PATIENT STRESSORS: Legal issue Marital or family conflict Medication change or noncompliance   PATIENT STRENGTHS: Ability for insight Communication skills Supportive family/friends   PATIENT IDENTIFIED PROBLEMS: Infective coping  Medication noncompliance                   DISCHARGE CRITERIA:  Adequate post-discharge living arrangements Improved stabilization in mood, thinking, and/or behavior Motivation to continue treatment in a less acute level of care  PRELIMINARY DISCHARGE PLAN: Attend PHP/IOP Return to previous living arrangement Return to previous work or school arrangements  PATIENT/FAMILY INVOLVEMENT: This treatment plan has been presented to and reviewed with the patient, [name] Shayan Fowers, and/or family member.  The patient and family have been given the opportunity to ask questions and make suggestions.  Leamon ArntShatara K Timithy Arons, RN 12/04/2018, 5:31 PM

## 2018-12-04 NOTE — ED Notes (Signed)

## 2018-12-04 NOTE — ED Provider Notes (Signed)
[facility] Emergency Department Provider Note  ____________________________________________   First MD Initiated Contact with Patient 12/04/18 [phone]     (approximate)  I have reviewed the triage vital signs and the nursing notes.   HISTORY  Chief Complaint Agitation  Level 5 caveat:  history/ROS limited by active psychosis / mental illness / altered mental status and/or vague historian and unwillingness to cooperate.   HPI Bridget Carpenter is a 27 y.o. female with a history of schizoaffective disorder as per Dr. Toni Amendlapacs recent evaluation, depression requiring inpatient treatment, etc.  Presents tonight under involuntary commitment by her mother for aggressive and violent and erratic behavior that her mother felt was consistent with worsening mental illness and that the patient represents a danger to herself and others.  She reportedly has been incarcerated recently and has not been taking her psychiatric medications.  She was initially agitated and upset when she arrived in the emergency department but was calm and cooperative for me, sleeping comfortably but minimally cooperative with my questions. She denies any medical complaints or concerns and states that she does not have any suicidal ideation nor homicidal ideation.  Her symptoms were reportedly severe and nothing particular makes them better or worse.  Past Medical History:  Diagnosis Date  . Asthma   . Depression 2009   Inpatient psych admission for SI, dissociative fugue  . Dissociative disorder or reaction 2009  . Eczema   . H/O: suicide attempt   . ODD (oppositional defiant disorder)   . PTSD (post-traumatic stress disorder)   . Schizoaffective disorder (HCC)   . Substance abuse [facility](HCC)     Patient Active Problem List   Diagnosis Date Noted  . Schizoaffective disorder, bipolar type (HCC) 05/03/2018  . Bacterial vaginosis 12/25/2017  . PTSD (post-traumatic stress disorder) 09/02/2017  .  Bipolar disorder, curr episode mixed, severe, with psychotic features (HCC) 09/02/2017  . MRSA carrier 07/18/2017  . Bipolar I disorder, most recent episode mixed, severe with psychotic features (HCC) 07/18/2017  . Overdose 07/16/2017  . Aspiration pneumonia (HCC) 07/16/2017  . Acute respiratory failure (HCC) 07/16/2017  . Polysubstance abuse (HCC) 07/16/2017  . Eczema 05/08/2017  . Borderline personality disorder (HCC) 11/06/2016  . Cocaine use disorder, severe, dependence (HCC) 11/06/2016  . Cannabis use disorder, moderate, dependence (HCC) 11/06/2016  . Alcohol use disorder, mild, abuse 11/06/2016  . Asthma 09/23/2011  . Tobacco use disorder 09/15/2009    Past Surgical History:  Procedure Laterality Date  . ADENOIDECTOMY    . TONSILLECTOMY      Prior to Admission medications   Medication Sig Start Date End Date Taking? Authorizing Provider  ARIPiprazole (ABILIFY) 20 MG tablet Take 1 tablet (20 mg total) by mouth daily. Patient not taking: Reported on 08/28/2018 05/05/18   Pucilowska, Braulio ConteJolanta B, MD  ARIPiprazole ER (ABILIFY MAINTENA) 400 MG SRER injection Inject 2 mLs (400 mg total) into the muscle every 28 (twenty-eight) days. Patient not taking: Reported on 08/28/2018 06/01/18   Pucilowska, Braulio ConteJolanta B, MD  DULoxetine (CYMBALTA) 30 MG capsule Take 30 mg by mouth daily.    [provider]  QUEtiapine (SEROQUEL) 100 MG tablet Take 1 tablet (100 mg total) by mouth at bedtime. Patient not taking: Reported on 08/28/2018 05/05/18   Kristine LineaPucilowska, Jolanta B, MD  venlafaxine XR (EFFEXOR-XR) 150 MG 24 hr capsule Take 1 capsule (150 mg total) by mouth daily with breakfast. Patient not taking: Reported on 08/28/2018 05/05/18   Shari ProwsPucilowska, Jolanta B, MD    Allergies  Banana  Family History  Adopted: Yes  Problem Relation Age of Onset  . Mental illness Mother   . Mental illness Father     Social History Social History   Tobacco Use  . Smoking status: Current Every Day Smoker     Packs/day: 0.25    Years: 2.00    Pack years: 0.50    Types: Cigarettes  . Smokeless tobacco: Never Used  Substance Use Topics  . Alcohol use: Yes    Alcohol/week: 1.0 standard drinks    Types: 1 Cans of beer per week    Comment: weekly  . Drug use: Yes    Types: Marijuana, "Crack" cocaine, Heroin    Comment: 4 grams daily    Review of Systems Level 5 caveat:  history/ROS limited by active psychosis / mental illness / altered mental status Constitutional: No fever/chills Eyes: No visual changes. ENT: No sore throat. Cardiovascular: Denies chest pain. Respiratory: Denies shortness of breath. Gastrointestinal: No abdominal pain.  No nausea, no vomiting.  No diarrhea.  No constipation. Genitourinary: Negative for dysuria. Musculoskeletal: Negative for neck pain.  Negative for back pain. Integumentary: Negative for rash. Neurological: Negative for headaches, focal weakness or numbness. Psychiatric:Patient was allegedly acting erratic and violent but denies SI and HI to me.  ____________________________________________   PHYSICAL EXAM:  VITAL SIGNS: ED Triage Vitals  Enc Vitals Group     BP 12/04/18 0421 (!) 97/59     Pulse Rate 12/04/18 0421 77     Resp 12/04/18 0421 18     Temp 12/04/18 0421 97.9 F (36.6 C)     Temp Source 12/04/18 0421 Axillary     SpO2 12/04/18 0421 100 %     Weight 12/04/18 0430 61.2 kg (135 lb)     Height 12/04/18 0430 1.575 m (5\' 2" )     Head Circumference --      Peak Flow --      Pain Score 12/04/18 0429 3     Pain Loc --      Pain Edu? --      Excl. in GC? --     Constitutional: Alert and oriented.  Generally well-appearing and in no distress but minimally cooperative with me. Eyes: Conjunctivae are normal.  Head: Atraumatic. Nose: No congestion/rhinnorhea. Mouth/Throat: Mucous membranes are moist. Neck: No stridor.  No meningeal signs.   Cardiovascular: Normal rate, regular rhythm. Good peripheral circulation. Grossly normal heart  sounds. Respiratory: Normal respiratory effort.  No retractions. Lungs CTAB. Gastrointestinal: Soft and nontender. No distention.  Musculoskeletal: No lower extremity tenderness nor edema. No gross deformities of extremities. Neurologic:  Normal speech and language. No gross focal neurologic deficits are appreciated.  Skin:  Skin is warm, dry and intact. No rash noted. Psychiatric: Mood and affect are withdrawn, calm but minimally cooperative.  Denies SI/HI.  ____________________________________________   LABS (all labs ordered are listed, but only abnormal results are displayed)  Labs Reviewed  COMPREHENSIVE METABOLIC PANEL - Abnormal; Notable for the following components:      Result Value   Potassium 3.4 (*)    Glucose, Bld 105 (*)    Calcium 8.6 (*)    All other components within normal limits  CBC - Abnormal; Notable for the following components:   RBC 3.76 (*)    Hemoglobin 11.6 (*)    All other components within normal limits  ACETAMINOPHEN LEVEL - Abnormal; Notable for the following components:   Acetaminophen (Tylenol), Serum <10 (*)    All other  components within normal limits  ETHANOL  SALICYLATE LEVEL  URINE DRUG SCREEN, QUALITATIVE ([facility] ONLY)  POC URINE PREG, ED   ____________________________________________  EKG  No indication for EKG ____________________________________________  RADIOLOGY   ED MD interpretation: No indication for imaging  Official radiology report(s): No results found.  ____________________________________________   PROCEDURES  Critical Care performed: No   Procedure(s) performed:   Procedures   ____________________________________________   INITIAL IMPRESSION / ASSESSMENT AND PLAN / ED COURSE  As part of my medical decision making, I reviewed the following data within the electronic MEDICAL RECORD NUMBER Nursing notes reviewed and incorporated, Labs reviewed , Old chart reviewed and A consult was requested and obtained from  this/these consultant(s) Psychiatry    Differential diagnosis includes, but is not limited to, schizoaffective disorder, impulse control disorder, depression, aggressive behavior.  Less likely acute infection.  The patient is denying any current symptoms and she is hemodynamically stable.  Lab work is all essentially normal with just a slightly low potassium but nothing that requires correction.  She has not yet provided a urine specimen but she has a negative acetaminophen, salicylate, and ethanol levels.  Given her history of requiring inpatient hospitalization at times, I have ordered TTS and tele-psychiatry consults and have upheld her involuntary commitment.  She is medically cleared for psychiatric disposition.     ____________________________________________  FINAL CLINICAL IMPRESSION(S) / ED DIAGNOSES  Final diagnoses:  Schizoaffective disorder, unspecified type (HCC)     MEDICATIONS GIVEN DURING THIS VISIT:  Medications - No data to display   ED Discharge Orders    None       Note:  This document was prepared using Dragon voice recognition software and may include unintentional dictation errors.    Loleta Rose, MD 12/04/18 [phone]
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
## 2018-12-04 NOTE — ED Notes (Signed)
Pt. Has security key in [address]

## 2018-12-04 NOTE — ED Notes (Signed)
Dressed out pt into burgundy scrubs. Pt belongings consists of black sneakers, pink socks, black and white sweater, gray jogger pants, one scrunchy, and one diamond earring with backing. Patient stated she had two earrings but there was only one.AS
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
## 2018-12-04 NOTE — BH Assessment (Signed)
[facility] and [facility].

## 2018-12-04 NOTE — BH Assessment (Signed)
[facility] by Dr. He..  Attending Physician will be Dr. Jennet MaduroPucilowska.   Patient has [facility] to room 314, [facility].   Intake Paper Work has been signed and placed on patient chart.  ER staff is aware of the admission:  Ronnie, ER Secretary    Dr. Darnelle CatalanMalinda, ER MD   Amy T., Patient's Nurse   Marylene LandAngela, Patient Access.
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
## 2018-12-04 NOTE — ED Notes (Signed)
ED  Is the patient under IVC or is there intent for IVC: Yes.   Is the patient medically cleared: Yes.   Is there vacancy in the ED BHU: Yes.   Is the population mix appropriate for patient: Yes.   Is the patient awaiting placement in inpatient or outpatient setting:  Has the patient had a psychiatric consult: SOC completed this am  - awaiting fax referral   Survey of unit performed for contraband, proper placement and condition of furniture, tampering with fixtures in bathroom, shower, and each patient room: Yes.  ; Findings:  APPEARANCE/BEHAVIOR Calm and cooperative NEURO ASSESSMENT Orientation: oriented x3  Denies pain Hallucinations: No.None noted (Hallucinations) denies  Speech: Normal Gait: normal RESPIRATORY ASSESSMENT Even  Unlabored respirations  CARDIOVASCULAR ASSESSMENT Pulses equal   regular rate  Skin warm and dry   GASTROINTESTINAL ASSESSMENT no GI complaint EXTREMITIES Full ROM  PLAN OF CARE Provide calm/safe environment. Vital signs assessed twice daily. ED BHU Assessment once each 12-hour shift. Collaborate with TTS daily or as condition indicates. Assure the ED provider has rounded once each shift. Provide and encourage hygiene. Provide redirection as needed. Assess for escalating behavior; address immediately and inform ED provider.  Assess family dynamic and appropriateness for visitation as needed: Yes.  ; If necessary, describe findings:  Educate the patient/family about BHU procedures/visitation: Yes.  ; If necessary, describe findings:

## 2018-12-05 MED ORDER — HYDROXYZINE HCL 50 MG PO TABS
50.0000 mg | ORAL_TABLET | Freq: Three times a day (TID) | ORAL | Status: DC | PRN
  Administered 2018-12-06 – 2018-12-07 (×3): 50 mg via ORAL
  Filled 2018-12-05 (×3): qty 1

## 2018-12-05 MED ORDER — TRAZODONE HCL 100 MG PO TABS
100.0000 mg | ORAL_TABLET | Freq: Every evening | ORAL | Status: DC | PRN
  Administered 2018-12-06: 100 mg via ORAL
  Filled 2018-12-05: qty 1
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
## 2018-12-05 NOTE — Progress Notes (Signed)
D: Upon this writer's arrival to the unit the patient is observed at the nursing station irritable about not receiving her belongings that had just recently arrived to the unit for the patient from her mother. Pt. Redirected and educated that after her belongings were searched and cleared for safety the appropriate belongings could be received to wear on the unit with all other items to be locked away for discharge. Pt. After belongings incident is more complaint with mood more pleasant. Pt. Came up for her night time medications and engaged during assessments. Pt. Reports anxiety is high, "9/10", but denies si/hi/avh, contracting verbally for safety. Pt. Denies depression. Pt. Continues to blame mother for how she is feeling. Pt. Reports anxious mood. Pt. Mood is labile overall.   A: Q x 15 minute observation checks were completed for safety. Patient was provided with education, but needs reinforcement.  Patient was given/offered medications per orders. Patient  was encourage to attend groups, participate in unit activities and continue with plan of care. Pt. Chart and plans of care reviewed. Pt. Given support and encouragement.   R: Patient is complaint with medication and unit procedures with direction and encouragement. Pt. Complaint with UA sampling. Pt. Attends snack time and group.             Precautionary checks every 15 minutes for safety maintained, room free of safety hazards, patient sustains no injury or falls during this shift. Will endorse care to next shift.

## 2018-12-05 NOTE — BHH Group Notes (Signed)
LCSW Group Therapy Note 12/05/2018 1:15pm  Type of Therapy and Topic: Group Therapy: Feelings Around Returning Home & Establishing a Supportive Framework and Supporting Oneself When Supports Not Available  Participation Level: Did Not Attend  Description of Group:  Patients first processed thoughts and feelings about upcoming discharge. These included fears of upcoming changes, lack of change, new living environments, judgements and expectations from others and overall stigma of mental health issues. The group then discussed the definition of a supportive framework, what that looks and feels like, and how do to discern it from an unhealthy non-supportive network. The group identified different types of supports as well as what to do when your family/friends are less than helpful or unavailable  Therapeutic Goals  1. Patient will identify one healthy supportive network that they can use at discharge. 2. Patient will identify one factor of a supportive framework and how to tell it from an unhealthy network. 3. Patient able to identify one coping skill to use when they do not have positive supports from others. 4. Patient will demonstrate ability to communicate their needs through discussion and/or role plays.  Summary of Patient Progress:  Pt was invited to attend group but chose not to attend. CSW will continue to encourage pt to attend group throughout their admission.   Therapeutic Modalities Cognitive Behavioral Therapy Motivational Interviewing   Bridget Carpenter  CUEBAS-COLON, LCSW 12/05/2018 12:51 PM
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
## 2018-12-05 NOTE — Progress Notes (Signed)
D- Patient alert and oriented. Patient presents in a pleasant mood on assessment stating that she slept alright last night and had no complaints to voice to this Clinical research associatewriter. Patient denies depression, however, she rates her anxiety a "9/10" reporting that "being here" is making her anxious. Patient denies SI, HI, AVH, and pain at this time. Patient has no stated goals for today.  A- Support and encouragement provided.  Routine safety checks conducted every 15 minutes. Patient informed to notify staff with problems or concerns.  R- Patient contracts for safety at this time. Patient receptive, calm, and cooperative. Patient interacts well with others on the unit. Patient remains safe at this time.

## 2018-12-05 NOTE — Plan of Care (Signed)
Pt. Interactions with staff and peers early in the shift is inappropriate and mood is irritable, but after patient is helped with getting her new belongings that were brought in searched and situated is pleasant and cooperative, engaging appropriately. Pt. Attends snack, group, and unit activities appropriately. Pt. Denies si/hi/avh, contracts for safety.    Problem: Coping: Goal: Ability to interact with others will improve Outcome: Progressing   Problem: Safety: Goal: Ability to remain free from injury will improve Outcome: Progressing

## 2018-12-06 ENCOUNTER — Encounter: Payer: Self-pay | Admitting: Psychiatry

## 2018-12-06 DIAGNOSIS — F3164 Bipolar disorder, current episode mixed, severe, with psychotic features: Principal | ICD-10-CM

## 2018-12-06 LAB — LIPID PANEL
CHOL/HDL RATIO: 1.7 ratio
Cholesterol: 122 mg/dL (ref 0–200)
HDL: 72 mg/dL (ref >40)
LDL Cholesterol: 42 mg/dL (ref 0–99)
Triglycerides: 40 mg/dL (ref <150)
VLDL: 8 mg/dL (ref 0–40)

## 2018-12-06 LAB — TSH: TSH: 1.453 u[IU]/mL (ref 0.350–4.500)

## 2018-12-06 LAB — HEPATIC FUNCTION PANEL
ALK PHOS: 45 U/L (ref 38–126)
ALT: 11 U/L (ref 0–44)
AST: 18 U/L (ref 15–41)
Albumin: 3.5 g/dL (ref 3.5–5.0)
Bilirubin, Direct: 0.1 mg/dL (ref 0.0–0.2)
Indirect Bilirubin: 0.4 mg/dL (ref 0.3–0.9)
Total Bilirubin: 0.5 mg/dL (ref 0.3–1.2)
Total Protein: 6.8 g/dL (ref 6.5–8.1)

## 2018-12-06 LAB — PREGNANCY, URINE: Preg Test, Ur: NEGATIVE

## 2018-12-06 MED ORDER — NICOTINE 21 MG/24HR TD PT24
21.0000 mg | MEDICATED_PATCH | Freq: Every day | TRANSDERMAL | Status: DC
  Filled 2018-12-06: qty 1

## 2018-12-06 MED ORDER — ARIPIPRAZOLE 10 MG PO TABS
20.0000 mg | ORAL_TABLET | Freq: Every day | ORAL | Status: DC
  Administered 2018-12-06 – 2018-12-07 (×2): 20 mg via ORAL
  Filled 2018-12-06 (×2): qty 2

## 2018-12-06 MED ORDER — QUETIAPINE FUMARATE 100 MG PO TABS: 100.0000 mg | ORAL_TABLET | Freq: Every day | ORAL | Status: DC

## 2018-12-06 MED ORDER — ARIPIPRAZOLE ER 400 MG IM SRER
400.0000 mg | INTRAMUSCULAR | Status: DC
  Administered 2018-12-06: 400 mg via INTRAMUSCULAR
  Filled 2018-12-06 (×2): qty 2

## 2018-12-06 NOTE — Progress Notes (Addendum)
Patient on and around this time up pacing around the hallways reporting, "I'm just going to walk around until I get tired to go to sleep" and "I'm going to be irritable today, because I didn't get my night medication". Pt. Was offered PRN medication before bed, but declined. Pt. During medication pass last night also educated that she did not have scheduled night medications, that she verbalized understanding. Pt. Visibly anxious, given PRN medication per patient request. Will continue to monitor.

## 2018-12-06 NOTE — Plan of Care (Addendum)
  Attending some of unit programing . Able to understand information received .  Working on concerns  that impact her daily living , coping skills , decision making. Able to talk about his self and expressing  . Compliant  with  medication and understand their use . Voice of no safety concerns  Mental and emotional status improving  Problem: Coping: Goal: Ability to identify and develop effective coping behavior will improve Outcome: Progressing Goal: Ability to interact with others will improve Outcome: Progressing Goal: Demonstration of participation in decision-making regarding own care will improve Outcome: Progressing Goal: Ability to use eye contact when communicating with others will improve Outcome: Progressing   Problem: Education: Goal: Mental status will improve Outcome: Progressing   Problem: Safety: Goal: Ability to remain free from injury will improve Outcome: Progressing

## 2018-12-06 NOTE — [facility] Group Notes (Signed)
[facility] LCSW Group Therapy Note  Date/Time: 12/06/18, 1300  Type of Therapy and Topic:  Group Therapy:  Overcoming Obstacles  Participation Level:  Did not attend  Description of Group:    In this group patients will be encouraged to explore what they see as obstacles to their own wellness and recovery. They will be guided to discuss their thoughts, feelings, and behaviors related to these obstacles. The group will process together ways to cope with barriers, with attention given to specific choices patients can make. Each patient will be challenged to identify changes they are motivated to make in order to overcome their obstacles. This group will be process-oriented, with patients participating in exploration of their own experiences as well as giving and receiving support and challenge from other group members.  Therapeutic Goals: 1. Patient will identify personal and current obstacles as they relate to admission. 2. Patient will identify barriers that currently interfere with their wellness or overcoming obstacles.  3. Patient will identify feelings, thought process and behaviors related to these barriers. 4. Patient will identify two changes they are willing to make to overcome these obstacles:    Summary of Patient Progress      Therapeutic Modalities:   Cognitive Behavioral Therapy Solution Focused Therapy Motivational Interviewing Relapse Prevention Therapy  Greg Kaizer Dissinger, LCSW 
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
## 2018-12-06 NOTE — Plan of Care (Signed)
Pt. Interactions with others more appropriate this evening, socializing more. Pt. Denies si/hi/avh, verbally contracts for safety.    Problem: Coping: Goal: Ability to interact with others will improve Outcome: Progressing   Problem: Safety: Goal: Ability to remain free from injury will improve Outcome: Progressing

## 2018-12-06 NOTE — Progress Notes (Signed)
Recreation Therapy Notes  INPATIENT RECREATION THERAPY ASSESSMENT  Patient Details Name: [name] Shayan Frankson MRN: [id number] DOB: 03/29/1991 Today's Date: 12/06/2018       Information Obtained From: (Patient focused on discharged and unable to answer questions)  Able to Participate in Assessment/Interview:    Patient Presentation:    Reason for Admission (Per Patient):    Patient Stressors:    Coping Skills:      Leisure Interests (2+):     Frequency of Recreation/Participation:    Awareness of Community Resources:     [employer] Resources:     Current Use:    If no, Barriers?:    Expressed Interest in State Street CorporationCommunity Resource Information:    [place]     Patient Main Form of Transportation:    Patient Strengths:     Patient Identified Areas of Improvement:     Patient Goal for Hospitalization:     Current SI (including self-harm):     Current HI:     Current AVH:    Staff Intervention Plan:    Consent to Intern Participation:    Warnell Rasnic 12/06/2018, 3:40 PM

## 2018-12-06 NOTE — Progress Notes (Signed)
Recreation Therapy Notes  Date: 12/06/2018  Time: 9:30 pm   Location: Craft Room   Behavioral response: N/A   Intervention Topic: Self-Care  Discussion/Intervention: Patient did not attend group.   Clinical Observations/Feedback:  Patient did not attend group.   Kelty Szafran LRT/CTRS        Bridget Carpenter 12/06/2018 12:22 PM 
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
## 2018-12-06 NOTE — Tx Team (Signed)
Interdisciplinary Treatment and Diagnostic Plan Update  12/06/2018 Time of Session: 1030am Bridget Carpenter MRN: [id number]  Principal Diagnosis: Bipolar I disorder, most recent episode mixed, severe with psychotic features (HCC)  Secondary Diagnoses: Principal Problem:   Bipolar I disorder, most recent episode mixed, severe with psychotic features (HCC) Active Problems:   Tobacco use disorder   Cocaine use disorder, severe, dependence (HCC)   Cannabis use disorder, moderate, dependence (HCC)   Current Medications:  Current Facility-Administered Medications  Medication Dose Route Frequency Provider Last Rate Last Dose  . acetaminophen (TYLENOL) tablet 650 mg  650 mg Oral Q6H PRN He, Jun, MD      . alum & mag hydroxide-simeth (MAALOX/MYLANTA) 200-200-20 MG/5ML suspension 30 mL  30 mL Oral Q4H PRN He, Jun, MD      . ARIPiprazole (ABILIFY) tablet 20 mg  20 mg Oral Daily Pucilowska, Jolanta B, MD   20 mg at 12/06/18 0819  . ARIPiprazole ER (ABILIFY MAINTENA) injection 400 mg  400 mg Intramuscular Q28 days Pucilowska, Jolanta B, MD   400 mg at 12/06/18 1131  . hydrOXYzine (ATARAX/VISTARIL) tablet 50 mg  50 mg Oral TID PRN He, Jun, MD   50 mg at 12/06/18 0435  . magnesium hydroxide (MILK OF MAGNESIA) suspension 30 mL  30 mL Oral Daily PRN He, Jun, MD      . nicotine (NICODERM CQ - dosed in mg/24 hours) patch 21 mg  21 mg Transdermal Daily Pucilowska, Jolanta B, MD      . QUEtiapine (SEROQUEL) tablet 100 mg  100 mg Oral QHS Pucilowska, Jolanta B, MD      . traZODone (DESYREL) tablet 100 mg  100 mg Oral QHS PRN He, Jun, MD       PTA Medications: Medications Prior to Admission  Medication Sig Dispense Refill Last Dose  . ARIPiprazole (ABILIFY) 20 MG tablet Take 1 tablet (20 mg total) by mouth daily. (Patient not taking: Reported on 08/28/2018) 30 tablet 1 Not Taking at Unknown time  . ARIPiprazole ER (ABILIFY MAINTENA) 400 MG SRER injection Inject 2 mLs (400 mg total) into the muscle every  28 (twenty-eight) days. (Patient not taking: Reported on 08/28/2018) 1 each 1 Not Taking at Unknown time  . DULoxetine (CYMBALTA) 30 MG capsule Take 30 mg by mouth daily.   Not Taking at Unknown time  . QUEtiapine (SEROQUEL) 100 MG tablet Take 1 tablet (100 mg total) by mouth at bedtime. (Patient not taking: Reported on 08/28/2018) 30 tablet 1 Not Taking at Unknown time  . venlafaxine XR (EFFEXOR-XR) 150 MG 24 hr capsule Take 1 capsule (150 mg total) by mouth daily with breakfast. (Patient not taking: Reported on 08/28/2018) 30 capsule 1 Not Taking at Unknown time    Patient Stressors: Legal issue Marital or family conflict Medication change or noncompliance  Patient Strengths: Ability for insight Communication skills Supportive family/friends  Treatment Modalities: Medication Management, Group therapy, Case management,  1 to 1 session with clinician, Psychoeducation, Recreational therapy.   Physician Treatment Plan for Primary Diagnosis: Bipolar I disorder, most recent episode mixed, severe with psychotic features (HCC) Long Term Goal(s): Improvement in symptoms so as ready for discharge Improvement in symptoms so as ready for discharge   Short Term Goals: Ability to identify changes in lifestyle to reduce recurrence of condition will improve Ability to verbalize feelings will improve Ability to disclose and discuss suicidal ideas Ability to demonstrate self-control will improve Ability to identify and develop effective coping behaviors will improve Ability to maintain  clinical measurements within normal limits will improve Compliance with prescribed medications will improve Ability to identify triggers associated with substance abuse/mental health issues will improve Ability to identify changes in lifestyle to reduce recurrence of condition will improve Ability to verbalize feelings will improve Ability to disclose and discuss suicidal ideas Ability to demonstrate self-control will  improve Ability to identify and develop effective coping behaviors will improve Ability to maintain clinical measurements within normal limits will improve Compliance with prescribed medications will improve Ability to identify triggers associated with substance abuse/mental health issues will improve  Medication Management: Evaluate patient's response, side effects, and tolerance of medication regimen.  Therapeutic Interventions: 1 to 1 sessions, Unit Group sessions and Medication administration.  Evaluation of Outcomes: Progressing  Physician Treatment Plan for Secondary Diagnosis: Principal Problem:   Bipolar I disorder, most recent episode mixed, severe with psychotic features (HCC) Active Problems:   Tobacco use disorder   Cocaine use disorder, severe, dependence (HCC)   Cannabis use disorder, moderate, dependence (HCC)  Long Term Goal(s): Improvement in symptoms so as ready for discharge Improvement in symptoms so as ready for discharge   Short Term Goals: Ability to identify changes in lifestyle to reduce recurrence of condition will improve Ability to verbalize feelings will improve Ability to disclose and discuss suicidal ideas Ability to demonstrate self-control will improve Ability to identify and develop effective coping behaviors will improve Ability to maintain clinical measurements within normal limits will improve Compliance with prescribed medications will improve Ability to identify triggers associated with substance abuse/mental health issues will improve Ability to identify changes in lifestyle to reduce recurrence of condition will improve Ability to verbalize feelings will improve Ability to disclose and discuss suicidal ideas Ability to demonstrate self-control will improve Ability to identify and develop effective coping behaviors will improve Ability to maintain clinical measurements within normal limits will improve Compliance with prescribed medications  will improve Ability to identify triggers associated with substance abuse/mental health issues will improve     Medication Management: Evaluate patient's response, side effects, and tolerance of medication regimen.  Therapeutic Interventions: 1 to 1 sessions, Unit Group sessions and Medication administration.  Evaluation of Outcomes: Progressing   RN Treatment Plan for Primary Diagnosis: Bipolar I disorder, most recent episode mixed, severe with psychotic features (HCC) Long Term Goal(s): Knowledge of disease and therapeutic regimen to maintain health will improve  Short Term Goals: Ability to verbalize feelings will improve, Ability to identify and develop effective coping behaviors will improve and Compliance with prescribed medications will improve  Medication Management: RN will administer medications as ordered by provider, will assess and evaluate patient's response and provide education to patient for prescribed medication. RN will report any adverse and/or side effects to prescribing provider.  Therapeutic Interventions: 1 on 1 counseling sessions, Psychoeducation, Medication administration, Evaluate responses to treatment, Monitor vital signs and CBGs as ordered, Perform/monitor CIWA, COWS, AIMS and Fall Risk screenings as ordered, Perform wound care treatments as ordered.  Evaluation of Outcomes: Progressing   LCSW Treatment Plan for Primary Diagnosis: Bipolar I disorder, most recent episode mixed, severe with psychotic features (HCC) Long Term Goal(s): Safe transition to appropriate next level of care at discharge, Engage patient in therapeutic group addressing interpersonal concerns.  Short Term Goals: Engage patient in aftercare planning with referrals and resources  Therapeutic Interventions: Assess for all discharge needs, 1 to 1 time with Social worker, Explore available resources and support systems, Assess for adequacy in community support network, Educate family and  significant  other(s) on suicide prevention, Complete Psychosocial Assessment, Interpersonal group therapy.  Evaluation of Outcomes: Progressing   Progress in Treatment: Attending groups: No. Participating in groups: No. Taking medication as prescribed: Yes. Toleration medication: Yes. Family/Significant other contact made: No, will contact:  CSW will contact when pt gives consent Patient understands diagnosis: Yes. Discussing patient identified problems/goals with staff: Yes. Medical problems stabilized or resolved: Yes. Denies suicidal/homicidal ideation: Yes. Issues/concerns per patient self-inventory: No. Other: NA  New problem(s) identified: No, Describe:  None reported  New Short Term/Long Term Goal(s): "To get out of here"  Patient Goals:  "To get out of here"  Discharge Plan or Barriers: Pt will return home and follow up with outpatient treatment  Reason for Continuation of Hospitalization: Medication stabilization  Estimated Length of Stay: 3-5 days  Attendees: Patient:Bridget Carpenter 12/06/2018 1:18 PM  Physician: Lilia ProJolenta Pucilowska, MD 12/06/2018 1:18 PM  Nursing: Hulan AmatoGwen Farrish, RN 12/06/2018 1:18 PM  RN Care Manager: 12/06/2018 1:18 PM  Social Worker: Lowella Dandyarren Layni Kreamer, LCSW 12/06/2018 1:18 PM  Recreational Therapist: Danella DeisShay. Outlaw CTRS, LRT 12/06/2018 1:18 PM  Other: Daleen SquibbGreg Wierda, LCSW 12/06/2018 1:18 PM  Other:  12/06/2018 1:18 PM  Other: 12/06/2018 1:18 PM    Scribe for Treatment Team: Suzan SlickARREN T Presleigh Feldstein, LCSW 12/06/2018 1:18 PM

## 2018-12-06 NOTE — Progress Notes (Signed)
D: Patient stated slept poor last night .Stated appetite fair and energy level normal. Stated concentration  good . Stated on Depression scale 5, hopeless 0 and anxiety 5 .( low 0-10 high) Denies suicidal  homicidal ideations  .  No auditory hallucinations  No pain concerns . Appropriate ADL'S. Interacting with peers and staff. Attending some of unit programing . Able to understand information received .  Working on concerns  that impact her daily living , coping skills , decision making. Able to talk about his self and expressing  . Compliant  with  medication and understand their use . Voice of no safety concerns  Mental and emotional status improving   A: Encourage patient participation with unit programming . Instruction  Given on  Medication , verbalize understanding.  R: Voice no other concerns. Staff continue to monitor

## 2018-12-07 MED ORDER — TRAZODONE HCL 100 MG PO TABS: 100.0000 mg | ORAL_TABLET | Freq: Every evening | ORAL | 1 refills | Status: DC | PRN

## 2018-12-07 MED ORDER — ARIPIPRAZOLE 20 MG PO TABS: 20.0000 mg | ORAL_TABLET | Freq: Every day | ORAL | 1 refills | Status: DC

## 2018-12-07 MED ORDER — ARIPIPRAZOLE ER 400 MG IM SRER: 400.0000 mg | INTRAMUSCULAR | 1 refills | Status: DC

## 2018-12-07 NOTE — [facility] Suicide Risk Assessment (Signed)
[facility]   Principal Problem: Bipolar I disorder, most recent episode mixed, severe with psychotic features [facility](HCC) Discharge Diagnoses: Principal Problem:   Bipolar I disorder, most recent episode mixed, severe with psychotic features (HCC) Active Problems:   Tobacco use disorder   Cocaine use disorder, severe, dependence (HCC)   Cannabis use disorder, moderate, dependence (HCC)   Total Time spent with patient: 20 minutes  Musculoskeletal: Strength & Muscle Tone: within normal limits Gait & Station: normal Patient leans: N/A  Psychiatric Specialty Exam: Review of Systems  Neurological: Negative.   Psychiatric/Behavioral: Positive for substance abuse.  All other systems reviewed and are negative.   Blood pressure 98/65, pulse 80, temperature 97.6 F (36.4 C), temperature source Oral, resp. rate 18, height 5\' 2"  (1.575 m), weight 61.2 kg, SpO2 100 %.Body mass index is 24.69 kg/m.  General Appearance: Casual  Eye Contact::  Good  Speech:  Clear and Coherent409  Volume:  Normal  Mood:  Euthymic  Affect:  Appropriate  Thought Process:  Goal Directed and Descriptions of Associations: Intact  Orientation:  Full (Time, Place, and Person)  Thought Content:  WDL  Suicidal Thoughts:  No  Homicidal Thoughts:  No  Memory:  Immediate;   Fair Recent;   Fair Remote;   Fair  Judgement:  Poor  Insight:  Lacking  Psychomotor Activity:  Normal  Concentration:  Fair  Recall:  [employer]Fair  Fund of Knowledge:Fair  Language: Fair  Akathisia:  No  Handed:  Right  AIMS (if indicated):     Assets:  Communication Skills Desire for Improvement Financial Resources/Insurance Housing Physical Health Resilience Social Support  Sleep:  Number of Hours: 7.3  Cognition: WNL  ADL's:  Intact   Mental Status Per Nursing Assessment::   On Admission:  Thoughts of violence towards others  Demographic Factors:  NA  Loss Factors: NA  Historical  Factors: Impulsivity  Risk Reduction Factors:   Responsible for children under 27 years of age, Sense of responsibility to family, Living with another person, especially a relative and Positive social support  Continued Clinical Symptoms:  Schizophrenia:   Less than 27 years old  Cognitive Features That Contribute To Risk:  None    Suicide Risk:  Minimal: No identifiable suicidal ideation.  Patients presenting with no risk factors but with morbid ruminations; may be classified as minimal risk based on the severity of the depressive symptoms    Plan Of Care/Follow-up recommendations:  Activity:  as tolerated Diet:  regular Other:  keep follow up appointments  Kristine LineaJolanta Pucilowska, MD 12/07/2018, 9:19 AM
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
## 2018-12-07 NOTE — Progress Notes (Signed)
D: Patient is aware of  Discharge this shift .Patient denies suicidal /homicidal ideations. Patient received all belongings brought in   A: No Storage medications. Writer reviewed Discharge Summary, Suicide Risk Assessment, and Transitional Record. Patient also received Prescriptions   from  MD.  Aware  Of follow up appointment . R: Patient left unit with no questions  Or concerns  With  Friend  

## 2018-12-07 NOTE — Progress Notes (Signed)
Patient ID: Bridget Carpenter, female   DOB: 07/08/1991, 27 y.o.   MRN: [id number] DAR Note: Pt observed pacing the hallway. Pt at assessment endorsed moderate anxiety and worrying. Pt denied SI/HI or pain. Encouragement and Safe environment provided. All Pt's questions and concerns addressed. Pt was med compliant. 15 min safety checks continue for Pt's safety.

## 2018-12-07 NOTE — Progress Notes (Signed)
  [facility] Adult Case Management Discharge Plan :  Will you be returning to the same living situation after discharge:  Yes,  pt lives with mother At discharge, do you have transportation home?: Yes,  Bobby Lee(friend) will pick up at discharge Do you have the ability to pay for your medications: Yes,  insurance  Release of information consent forms completed and in the chart;  Patient's signature needed at discharge.  Patient to Follow up at: Follow-up Information    [facility] Go on 12/13/2018.   Why:  Please meet Unk PintoHarvey Bryant at [facility] on Monday, December 13, 2018 at 7:15am. Please bring photo ID, medication, insurance card. Thank you. Contact information: [address] [place] [address] [phone]           Next level of care provider has access to [facility] Link:no  Safety Planning and Suicide Prevention discussed: Yes,  with pt; pt refused contact with family  Have you used any form of tobacco in the last 30 days? (Cigarettes, Smokeless Tobacco, Cigars, and/or Pipes): Yes  Has patient been referred to the Quitline?: Patient refused referral  Patient has been referred for addiction treatment: Pt. refused referral  Kailin Principato T Kobey Sides, LCSW 12/07/2018, 11:09 AM

## 2018-12-07 NOTE — [facility] Suicide Risk Assessment (Signed)
[facility] INPATIENT:  Family/Significant Other Suicide Prevention Education  Suicide Prevention Education:  Patient Refusal for Family/Significant Other Suicide Prevention Education: The patient [name] has refused to provide written consent for family/significant other to be provided Family/Significant Other Suicide Prevention Education during admission and/or prior to discharge.  Physician notified.  Jessalyn Hinojosa T Binh Doten 12/07/2018, 9:21 AM

## 2019-04-15 ENCOUNTER — Emergency Department
Admission: EM | Admit: 2019-04-15 | Discharge: 2019-04-16 | Disposition: A | Discharge status: Home / Self Care | Payer: Medicaid Other | Attending: Emergency Medicine | Admitting: Emergency Medicine

## 2019-04-15 ENCOUNTER — Encounter: Payer: Self-pay | Admitting: Emergency Medicine

## 2019-04-15 DIAGNOSIS — F1420 Cocaine dependence, uncomplicated: Secondary | ICD-10-CM | POA: Diagnosis present

## 2019-04-15 DIAGNOSIS — F17210 Nicotine dependence, cigarettes, uncomplicated: Secondary | ICD-10-CM | POA: Insufficient documentation

## 2019-04-15 DIAGNOSIS — Z79899 Other long term (current) drug therapy: Secondary | ICD-10-CM | POA: Insufficient documentation

## 2019-04-15 DIAGNOSIS — R4182 Altered mental status, unspecified: Secondary | ICD-10-CM | POA: Diagnosis present

## 2019-04-15 DIAGNOSIS — F1414 Cocaine abuse with cocaine-induced mood disorder: Secondary | ICD-10-CM | POA: Diagnosis present

## 2019-04-15 DIAGNOSIS — F1994 Other psychoactive substance use, unspecified with psychoactive substance-induced mood disorder: Secondary | ICD-10-CM

## 2019-04-15 DIAGNOSIS — J45909 Unspecified asthma, uncomplicated: Secondary | ICD-10-CM | POA: Diagnosis not present

## 2019-04-15 LAB — CBC
HCT: 32.7 % — ABNORMAL LOW (ref 36.0–46.0)
Hemoglobin: 10.8 g/dL — ABNORMAL LOW (ref 12.0–15.0)
MCH: 31.4 pg (ref 26.0–34.0)
MCHC: 33 g/dL (ref 30.0–36.0)
MCV: 95.1 fL (ref 80.0–100.0)
Platelets: 230 10*3/uL (ref 150–400)
RBC: 3.44 MIL/uL — ABNORMAL LOW (ref 3.87–5.11)
RDW: 13 % (ref 11.5–15.5)
WBC: 12.2 10*3/uL — ABNORMAL HIGH (ref 4.0–10.5)
nRBC: 0 % (ref 0.0–0.2)

## 2019-04-15 LAB — COMPREHENSIVE METABOLIC PANEL
ALT: 10 U/L (ref 0–44)
AST: 27 U/L (ref 15–41)
Albumin: 3.7 g/dL (ref 3.5–5.0)
Alkaline Phosphatase: 33 U/L — ABNORMAL LOW (ref 38–126)
Anion gap: 13 (ref 5–15)
BUN: 16 mg/dL (ref 6–20)
CO2: 17 mmol/L — ABNORMAL LOW (ref 22–32)
Calcium: 8.9 mg/dL (ref 8.9–10.3)
Chloride: 108 mmol/L (ref 98–111)
Creatinine, Ser: 1.02 mg/dL — ABNORMAL HIGH (ref 0.44–1.00)
GFR calc Af Amer: >60 mL/min (ref >60)
GFR calc non Af Amer: >60 mL/min (ref >60)
Glucose, Bld: 116 mg/dL — ABNORMAL HIGH (ref 70–99)
Potassium: 3 mmol/L — ABNORMAL LOW (ref 3.5–5.1)
Sodium: 138 mmol/L (ref 135–145)
Total Bilirubin: 1 mg/dL (ref 0.3–1.2)
Total Protein: 7 g/dL (ref 6.5–8.1)

## 2019-04-15 MED ORDER — GABAPENTIN 300 MG PO CAPS
300.0000 mg | ORAL_CAPSULE | Freq: Three times a day (TID) | ORAL | Status: DC
  Administered 2019-04-16 (×2): 300 mg via ORAL
  Filled 2019-04-15 (×2): qty 1

## 2019-04-15 NOTE — ED Notes (Signed)
Pt. Currently sleeping in bed. 

## 2019-04-15 NOTE — ED Notes (Signed)
IVC/Consult completed/ Pending Disposition 

## 2019-04-15 NOTE — ED Notes (Signed)
Report from [place], [place]. This RN in room to check on pt. Pt is sleeping at this time. Breathing is equal and unlabored at this time. VSS. Will continue to monitor patient.

## 2019-04-15 NOTE — ED Provider Notes (Signed)
[facility] Emergency Department Provider Note _____________________  Time seen 3:20 AM I have reviewed the triage vital signs and the nursing notes.   HISTORY  Chief Complaint Altered Mental Status   HPI Bridget Carpenter is a 28 y.o. female with medical history as listed below presents to the emergency department in police custody secondary to altered mental status and combativeness.  EMS personnel states that patient markedly combative on their arrival and as such patient was given 5 mg of Haldol and 2 mg of Versed without any improvement and as such doses were repeated patient now presents to the emergency department still combative but less so than reported by EMS.  Patient admitted to police while in route that she used cocaine tonight.  Misty Stanley also states that the patient informed her mom that she was meeting up with a friend and that she would not be home tonight.  Please states that the patient's mother states that she came home and very altered state        Past Medical History:  Diagnosis Date  . Asthma   . Depression 2009   Inpatient psych admission for SI, dissociative fugue  . Dissociative disorder or reaction 2009  . Eczema   . H/O: suicide attempt   . ODD (oppositional defiant disorder)   . PTSD (post-traumatic stress disorder)   . Schizoaffective disorder (HCC)   . Substance abuse [facility])     Patient Active Problem List   Diagnosis Date Noted  . 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/03/2018  . Bacterial vaginosis 12/25/2017  . PTSD (post-traumatic stress disorder) 09/02/2017  . Bipolar disorder, curr episode mixed, severe, with psychotic features (HCC) 09/02/2017  . MRSA carrier 07/18/2017  . Bipolar I disorder, most recent episode mixed, severe with psychotic features (HCC) 07/18/2017  . Overdose 07/16/2017  . Aspiration pneumonia (HCC) 07/16/2017  . Acute respiratory failure (HCC) 07/16/2017  . Polysubstance abuse  (HCC) 07/16/2017  . Eczema 05/08/2017  . Borderline personality disorder (HCC) 11/06/2016  . Cocaine use disorder, severe, dependence (HCC) 11/06/2016  . Cannabis use disorder, moderate, dependence (HCC) 11/06/2016  . Alcohol use disorder, mild, abuse 11/06/2016  . Asthma 09/23/2011  . Tobacco use disorder 09/15/2009    Past Surgical History:  Procedure Laterality Date  . ADENOIDECTOMY    . TONSILLECTOMY      Prior to Admission medications   Medication Sig Start Date End Date Taking? Authorizing Provider  ARIPiprazole (ABILIFY) 20 MG tablet Take 1 tablet (20 mg total) by mouth daily. 12/07/18   Pucilowska, Braulio Conte B, MD  ARIPiprazole ER (ABILIFY MAINTENA) 400 MG SRER injection Inject 2 mLs (400 mg total) into the muscle every 28 (twenty-eight) days. Next dose on 01/03/2018 12/07/18   Pucilowska, Braulio Conte B, MD  traZODone (DESYREL) 100 MG tablet Take 1 tablet (100 mg total) by mouth at bedtime as needed for sleep. 12/07/18   Pucilowska, Ellin Goodie, MD    Allergies Banana  Family History  Adopted: Yes  Problem Relation Age of Onset  . Mental illness Mother   . Mental illness Father     Social History Social History   Tobacco Use  . Smoking status: Current Every Day Smoker    Packs/day: 0.25    Years: 2.00    Pack years: 0.50    Types: Cigarettes  . Smokeless tobacco: Never Used  Substance Use Topics  . Alcohol use: Yes    Alcohol/week: 1.0 standard drinks    Types: 1 Cans of beer  per week    Comment: weekly  . Drug use: Yes    Types: Marijuana, "Crack" cocaine, Heroin    Comment: 4 grams daily    Review of Systems Constitutional: No fever/chills Eyes: No visual changes. ENT: No sore throat. Cardiovascular: Denies chest pain. Respiratory: Denies shortness of breath. Gastrointestinal: No abdominal pain.  No nausea, no vomiting.  No diarrhea.  No constipation. Genitourinary: Negative for dysuria. Musculoskeletal: Negative for neck pain.  Negative for back pain.  Integumentary: Negative for rash. Neurological: Negative for headaches, focal weakness or numbness. Psychiatric: Positive for reported cocaine use tonight ____________________________________________   PHYSICAL EXAM:  VITAL SIGNS: ED Triage Vitals  Enc Vitals Group     BP 04/15/19 0340 111/79     Pulse Rate 04/15/19 0340 97     Resp 04/15/19 0340 20     Temp 04/15/19 0340 99.1 F (37.3 C)     Temp Source 04/15/19 0340 Axillary     SpO2 04/15/19 0340 100 %     Weight 04/15/19 0339 61 kg (134 lb 7.7 oz)     Height --      Head Circumference --      Peak Flow --      Pain Score 04/15/19 0338 0     Pain Loc --      Pain Edu? --      Excl. in GC? --     Constitutional: Alert and oriented. Agitated Combative Eyes: Conjunctivae are normal. Head: Atraumatic. Mouth/Throat: Mucous membranes are moist.  Oropharynx non-erythematous. Neck: No stridor.   Cardiovascular: Tachycardia, regular rhythm. Good peripheral circulation. Grossly normal heart sounds. Respiratory: Normal respiratory effort.  No retractions. No audible wheezing. Gastrointestinal: Soft and nontender. No distention.  Musculoskeletal: No lower extremity tenderness nor edema. No gross deformities of extremities. Neurologic: Nonsensical speech  No gross focal neurologic deficits are appreciated.  Skin:  Skin is warm, dry and intact. No rash noted. Psychiatric: Agitated, combative nonsensical speech  ____________________________________________   LABS (all labs ordered are listed, but only abnormal results are displayed)  Labs Reviewed  CBC  COMPREHENSIVE METABOLIC PANEL  URINE DRUG SCREEN, QUALITATIVE ([facility] ONLY)        Procedures   ____________________________________________   INITIAL IMPRESSION / MDM / ASSESSMENT AND PLAN / ED COURSE  As part of my medical decision making, I reviewed the following data within the electronic MEDICAL RECORD 54 year old female presenting with above-stated history and  physical exam with acute psychosis most likely induced by psychoactive substance.  Patient was given a total of 10 mg of Haldol 4 mg of Versed before arrival to the emergency department.  No additional dictation was necessary as the patient is now resting comfortably in no apparent distress.  Awaiting psychiatry consultation.  Bridget Carpenter was evaluated in Emergency Department on 04/15/2019 for the symptoms described in the history of present illness. She was evaluated in the context of the global COVID-19 pandemic, which necessitated consideration that the patient might be at risk for infection with the SARS-CoV-2 virus that causes COVID-19. Institutional protocols and algorithms that pertain to the evaluation of patients at risk for COVID-19 are in a state of rapid change based on information released by regulatory bodies including the CDC and federal and state organizations. These policies and algorithms were followed during the patient's care in the ED.      ____________________________________________  FINAL CLINICAL IMPRESSION(S) / ED DIAGNOSES  Final diagnoses:  Psychoactive substance-induced psychosis (HCC)     MEDICATIONS GIVEN  DURING THIS VISIT:  Medications - No data to display   ED Discharge Orders    None       Note:  This document was prepared using Dragon voice recognition software and may include unintentional dictation errors.   Darci CurrentBrown, [place] N, MD 04/15/19 [phone]

## 2019-04-15 NOTE — ED Notes (Signed)
Patient assigned to appropriate care area   Introduced self to pt  Patient oriented to unit/care area: Informed that, for their safety, care areas are designed for safety and visiting and phone hours explained to patient. Patient verbalizes understanding, and verbal contract for safety obtained  Environment secured   Patient appropriate and cooperative, she asked to have something to eat, patient was given a [place] sandwich meal

## 2019-04-15 NOTE — BH Assessment (Signed)
Per the request of Nurse Practitioner Nanine Means), writer spoke with the patient's mother (Ms. [phone]). Per the report of the patient's mother, she left the home around 1:30am. She called her mother and said she wasn't coming home. However, she came back home and her behaviors were erratic and unpredictable. Mother states she was "yelling for Jehovah to help her and some other stuff..." She further reports, the patient is only like this when she using drugs. Mother is unsure what she is abusing but it causes changes in her behaviors. When patient is sober, she is able to interact with the patient with no problems. Mother also reports of having no concerns for the patient safety or the patient hurting anyone else. She had one suicide attempt in the past "but we got her help and we don't worry about her now doing anything to hurt herself..."   Mother also shared she was consulting with a lawyer to get guardianship of the patient because of her ongoing drug use and unwillness to be med non-compliant.

## 2019-04-15 NOTE — ED Provider Notes (Signed)
-----------------------------------------   7:52 AM on 04/15/2019 -----------------------------------------   Blood pressure 99/62, pulse 86, temperature 99.1 F (37.3 C), temperature source Axillary, resp. rate 16, weight 61 kg, SpO2 100 %.  The patient is calm and cooperative at this time.  There have been no acute events since the last update.  Awaiting disposition plan from Behavioral Medicine team.    Arnaldo Natal, MD 04/15/19 [phone]

## 2019-04-15 NOTE — ED Triage Notes (Addendum)
Patient's mother called EMS for abnormal behavior. Patient aggressive and combative at EMS arrival. Patient given a total of 10 mg of haldol and 4 mg of Versed in transport. Patient admitted to cocaine use in transport.   Patient met female friend at 02:30, at 02:45 patient returned to her mothers home - broke the door to get in.

## 2019-04-15 NOTE — Consult Note (Signed)
[facility] Face-to-Face Psychiatry Consult   Reason for Consult:  Cocaine abuse with altered mental status Referring Physician:  EDP Patient Identification: [facility] MRN:  [id number] Principal Diagnosis: Cocaine abuse with cocaine-induced mood disorder ([facility]) Diagnosis:  Principal Problem:   Cocaine abuse with cocaine-induced mood disorder ([facility]) Active Problems:   Cocaine use disorder, severe, dependence ([facility])  Total Time spent with patient: 45 minutes  Subjective:   [facility] is a 28 y.o. female patient admitted with altered mental status.  HPI: 28 yo female who presented to the ED via her mother who called police when she returned home in an altered state.  When [facility] arrived, she was combative and given [facility].  En route to the [facility], she reported she had been using cocaine earlier in the evening.  Patient slept, difficult to arouse most of the day.  She is now clear, coherent, calm.  Denies suicidal/homicidal ideations, hallucinations, and withdrawal symptoms.  Asked about her Abilify that she was discharged on in December in her last admission at [facility].  She stopped after discharge and reports, "I don't take any medications."  Her substance use increases with who she is affiliating with and decreases with less stress.  Stable psychiatrically at this time.  Mother contacted for collateral, who she lives with.  Reports no safety concerns, no threats to self or others, would like her to be "locked up" to stay clean for 6-12 months.  IOP information provided.  Past Psychiatric History: substance abuse, schizoaffective disorder  Risk to Self:  none Risk to Others:  none Prior Inpatient Therapy:  [facility] Prior Outpatient Therapy:  [facility] but did not go  Past Medical History:  Past Medical History:  Diagnosis Date  . Asthma   . Depression 2009   Inpatient psych admission for SI, dissociative fugue  . Dissociative disorder or reaction 2009  . Eczema   . H/O: suicide attempt    . ODD (oppositional defiant disorder)   . PTSD (post-traumatic stress disorder)   . Schizoaffective disorder ([facility])   . Substance abuse [facility])     Past Surgical History:  Procedure Laterality Date  . ADENOIDECTOMY    . TONSILLECTOMY     Family History:  Family History  Adopted: Yes  Problem Relation Age of Onset  . Mental illness Mother   . Mental illness Father    Family Psychiatric  History: mother and father with mental illness noted above Social History:  Social History   Substance and Sexual Activity  Alcohol Use Yes  . Alcohol/week: 1.0 standard drinks  . Types: 1 Cans of beer per week   Comment: weekly     Social History   Substance and Sexual Activity  Drug Use Yes  . Types: Marijuana, "Crack" cocaine, Heroin   Comment: 4 grams daily    Social History   Socioeconomic History  . Marital status: Single    Spouse name: Not on file  . Number of children: Not on file  . Years of education: Not on file  . Highest education level: Not on file  Occupational History  . Not on file  Social Needs  . Financial resource strain: Not on file  . Food insecurity:    Worry: Not on file    Inability: Not on file  . Transportation needs:    Medical: Not on file    Non-medical: Not on file  Tobacco Use  . Smoking status: Current Every Day Smoker    Packs/day: 0.25  Years: 2.00    Pack years: 0.50    Types: Cigarettes  . Smokeless tobacco: Never Used  Substance and Sexual Activity  . Alcohol use: Yes    Alcohol/week: 1.0 standard drinks    Types: 1 Cans of beer per week    Comment: weekly  . Drug use: Yes    Types: Marijuana, "Crack" cocaine, Heroin    Comment: 4 grams daily  . Sexual activity: Yes    Birth control/protection: Condom  Lifestyle  . Physical activity:    Days per week: Not on file    Minutes per session: Not on file  . Stress: Not on file  Relationships  . Social connections:    Talks on phone: Not on file    Gets together: Not on file     Attends religious service: Not on file    Active member of club or organization: Not on file    Attends meetings of clubs or organizations: Not on file    Relationship status: Not on file  Other Topics Concern  . Not on file  Social History Narrative  . Not on file   Additional Social History:    Allergies:   Allergies  Allergen Reactions  . Banana Hives and Swelling    Labs:  Results for orders placed or performed during the [facility] encounter of 04/15/19 (from the past 48 hour(s))  CBC     Status: Abnormal   Collection Time: 04/15/19  3:37 AM  Result Value Ref Range   WBC 12.2 (H) 4.0 - 10.5 K/uL   RBC 3.44 (L) 3.87 - 5.11 MIL/uL   Hemoglobin 10.8 (L) 12.0 - 15.0 g/dL   HCT 27.2 (L) 53.6 - 64.4 %   MCV 95.1 80.0 - 100.0 fL   MCH 31.4 26.0 - 34.0 pg   MCHC 33.0 30.0 - 36.0 g/dL   RDW 03.4 74.2 - 59.5 %   Platelets 230 150 - 400 K/uL   nRBC 0.0 0.0 - 0.2 %    Comment: Performed at [facility], [address] [place], [facility]  Comprehensive metabolic panel     Status: Abnormal   Collection Time: 04/15/19  3:37 AM  Result Value Ref Range   Sodium 138 135 - 145 mmol/L   Potassium 3.0 (L) 3.5 - 5.1 mmol/L   Chloride 108 98 - 111 mmol/L   CO2 17 (L) 22 - 32 mmol/L   Glucose, Bld 116 (H) 70 - 99 mg/dL   BUN 16 6 - 20 mg/dL   Creatinine, Ser 6.43 (H) 0.44 - 1.00 mg/dL   Calcium 8.9 8.9 - 32.9 mg/dL   Total Protein 7.0 6.5 - 8.1 g/dL   Albumin 3.7 3.5 - 5.0 g/dL   AST 27 15 - 41 U/L   ALT 10 0 - 44 U/L   Alkaline Phosphatase 33 (L) 38 - 126 U/L   Total Bilirubin 1.0 0.3 - 1.2 mg/dL   GFR calc non Af Amer >60 >60 mL/min   GFR calc Af Amer >60 >60 mL/min   Anion gap 13 5 - 15    Comment: Performed at [facility], [address] [place], [facility]    Current Facility-Administered Medications  Medication Dose Route Frequency Provider Last Rate Last Dose  . gabapentin (NEURONTIN) capsule 300 mg  300 mg Oral TID [facility], [facility]       Current Outpatient Medications  Medication Sig Dispense Refill  . ARIPiprazole (ABILIFY) 20 MG  tablet Take 1 tablet (20 mg total) by mouth daily. 30 tablet 1  . ARIPiprazole ER (ABILIFY MAINTENA) 400 MG SRER injection Inject 2 mLs (400 mg total) into the muscle every 28 (twenty-eight) days. Next dose on 01/03/2018 1 each 1  . traZODone (DESYREL) 100 MG tablet Take 1 tablet (100 mg total) by mouth at bedtime as needed for sleep. 30 tablet 1    Musculoskeletal: Strength & Muscle Tone: within normal limits Gait & Station: normal Patient leans: N/A  Psychiatric Specialty Exam: Physical Exam  Nursing note and vitals reviewed. Constitutional: She is oriented to person, place, and time. She appears well-developed and well-nourished.  HENT:  Head: Normocephalic.  Respiratory: Effort normal.  Musculoskeletal: Normal range of motion.  Neurological: She is alert and oriented to person, place, and time.  Psychiatric: She has a normal mood and affect. Her speech is normal and behavior is normal. Judgment and thought content normal. Cognition and memory are normal.    Review of Systems  Psychiatric/Behavioral: Positive for substance abuse.  All other systems reviewed and are negative.   Blood pressure (!) 118/59, pulse 98, temperature 99.1 F (37.3 C), temperature source Axillary, resp. rate 16, weight 61 kg, SpO2 100 %.Body mass index is 24.6 kg/m.  General Appearance: Disheveled  Eye Contact:  Good  Speech:  Normal Rate  Volume:  Normal  Mood:  Euthymic  Affect:  Congruent  Thought Process:  Coherent and Descriptions of Associations: Intact  Orientation:  Full (Time, Place, and Person)  Thought Content:  WDL and Illogical  Suicidal Thoughts:  No  Homicidal Thoughts:  No  Memory:  Immediate;   Good Recent;   Good Remote;   Good  Judgement:  Fair  Insight:  Fair  Psychomotor Activity:  Decreased  Concentration:  Concentration: Good and Attention Span: Good  Recall:  Good   Fund of Knowledge:  Good  Language:  Good  Akathisia:  No  Handed:  Right  AIMS (if indicated):     Assets:  Housing Leisure Time Physical Health Resilience Social Support  ADL's:  Intact  Cognition:  WNL  Sleep:       Treatment Plan Summary: Cocaine abuse with cocaine induced mood disorder: -Started gabapentin 300 mg TID -Referred to IOP substance abuse  Disposition: No evidence of imminent risk to self or others at present.    Nanine MeansLORD, JAMISON, [facility] 04/15/2019 4:21 PM

## 2019-04-15 NOTE — ED Notes (Signed)
Patient continues to be asleep with no complaints. NAD noted

## 2019-04-16 DIAGNOSIS — F1414 Cocaine abuse with cocaine-induced mood disorder: Secondary | ICD-10-CM

## 2019-04-16 NOTE — ED Provider Notes (Signed)
-----------------------------------------   10:36 AM on 04/16/2019 -----------------------------------------  The patient has been evaluated by psychiatry.  She has been cleared for discharge home.  IVC has been rescinded.  The patient is stable for discharge at this time.  She will be given outpatient substance abuse counseling referral.  Return precautions have been provided.   Dionne Bucy, MD 04/16/19 1037

## 2019-04-16 NOTE — Discharge Instructions (Addendum)
Return to the ER for any new or worsening symptoms that concern you. °

## 2019-04-16 NOTE — Consult Note (Signed)
[facility] Face-to-Face Psychiatry Consult   Reason for Consult:  Cocaine abuse with altered mental status Referring Physician:  EDP Patient Identification: [facility] MRN:  [id number] Principal Diagnosis: Cocaine abuse with cocaine-induced mood disorder ([facility]) Diagnosis:  Principal Problem:   Cocaine abuse with cocaine-induced mood disorder ([facility]) Active Problems:   Cocaine use disorder, severe, dependence ([facility])  Total Time spent with patient: 20 minutes  Subjective:   [facility] is a 28 y.o. female patient admitted with altered mental status.  HPI:  Per chart review 28 yo female who presented to the ED via her mother who called police when she returned home in an altered state.  When EMS arrived, she was combative and given Versed.  En route to the hospital, she reported she had been using cocaine earlier in the evening.  He was evaluated by the psychiatric team yesterday and found not to be an imminent harm to herself or others and referred to intensive outpatient treatment for substance use and started on gabapentin.  On interview today she is somnolent but calm, and clear.  He denies suicidality/homicidality she does endorse hallucinations does not appear to be responding to internal stimuli at this time.  She endorses withdrawal symptoms related to cocaine such as somnolence, increased appetite, and irritability.  She reports she is not interested in taking medications outside of the hospital due to "they do not work".  She requested to be discharged home and said that her mother will come pick her up from the hospital.  Per chart the mother has been contacted and has no safety concerns than that she would like her to stay clean for 6 to 12 months.  Formation for IOP was provided by the psychiatric team yesterday  Past Psychiatric History: substance abuse, schizoaffective disorder  Risk to Self:  none Risk to Others:  none Prior Inpatient Therapy:  [facility] Prior Outpatient  Therapy:  [facility] but did not go  Past Medical History:  Past Medical History:  Diagnosis Date  . Asthma   . Depression 2009   Inpatient psych admission for SI, dissociative fugue  . Dissociative disorder or reaction 2009  . Eczema   . H/O: suicide attempt   . ODD (oppositional defiant disorder)   . PTSD (post-traumatic stress disorder)   . Schizoaffective disorder ([facility])   . Substance abuse [facility])     Past Surgical History:  Procedure Laterality Date  . ADENOIDECTOMY    . TONSILLECTOMY     Family History:  Family History  Adopted: Yes  Problem Relation Age of Onset  . Mental illness Mother   . Mental illness Father    Family Psychiatric  History: mother and father with mental illness noted above Social History:  Social History   Substance and Sexual Activity  Alcohol Use Yes  . Alcohol/week: 1.0 standard drinks  . Types: 1 Cans of beer per week   Comment: weekly     Social History   Substance and Sexual Activity  Drug Use Yes  . Types: Marijuana, "Crack" cocaine, Heroin   Comment: 4 grams daily    Social History   Socioeconomic History  . Marital status: Single    Spouse name: Not on file  . Number of children: Not on file  . Years of education: Not on file  . Highest education level: Not on file  Occupational History  . Not on file  Social Needs  . Financial resource strain: Not on file  . Food insecurity:  Worry: Not on file    Inability: Not on file  . Transportation needs:    Medical: Not on file    Non-medical: Not on file  Tobacco Use  . Smoking status: Current Every Day Smoker    Packs/day: 0.25    Years: 2.00    Pack years: 0.50    Types: Cigarettes  . Smokeless tobacco: Never Used  Substance and Sexual Activity  . Alcohol use: Yes    Alcohol/week: 1.0 standard drinks    Types: 1 Cans of beer per week    Comment: weekly  . Drug use: Yes    Types: Marijuana, "Crack" cocaine, Heroin    Comment: 4 grams daily  . Sexual activity: Yes     Birth control/protection: Condom  Lifestyle  . Physical activity:    Days per week: Not on file    Minutes per session: Not on file  . Stress: Not on file  Relationships  . Social connections:    Talks on phone: Not on file    Gets together: Not on file    Attends religious service: Not on file    Active member of club or organization: Not on file    Attends meetings of clubs or organizations: Not on file    Relationship status: Not on file  Other Topics Concern  . Not on file  Social History Narrative  . Not on file   Additional Social History:    Allergies:   Allergies  Allergen Reactions  . Banana Hives and Swelling    Labs:  Results for orders placed or performed during the hospital encounter of 04/15/19 (from the past 48 hour(s))  CBC     Status: Abnormal   Collection Time: 04/15/19  3:37 AM  Result Value Ref Range   WBC 12.2 (H) 4.0 - 10.5 K/uL   RBC 3.44 (L) 3.87 - 5.11 MIL/uL   Hemoglobin 10.8 (L) 12.0 - 15.0 g/dL   HCT 97.3 (L) 53.2 - 99.2 %   MCV 95.1 80.0 - 100.0 fL   MCH 31.4 26.0 - 34.0 pg   MCHC 33.0 30.0 - 36.0 g/dL   RDW 42.6 83.4 - 19.6 %   Platelets 230 150 - 400 K/uL   nRBC 0.0 0.0 - 0.2 %    Comment: Performed at [facility], [address] [place], [place] [address]  Comprehensive metabolic panel     Status: Abnormal   Collection Time: 04/15/19  3:37 AM  Result Value Ref Range   Sodium 138 135 - 145 mmol/L   Potassium 3.0 (L) 3.5 - 5.1 mmol/L   Chloride 108 98 - 111 mmol/L   CO2 17 (L) 22 - 32 mmol/L   Glucose, Bld 116 (H) 70 - 99 mg/dL   BUN 16 6 - 20 mg/dL   Creatinine, Ser 9.89 (H) 0.44 - 1.00 mg/dL   Calcium 8.9 8.9 - 21.1 mg/dL   Total Protein 7.0 6.5 - 8.1 g/dL   Albumin 3.7 3.5 - 5.0 g/dL   AST 27 15 - 41 U/L   ALT 10 0 - 44 U/L   Alkaline Phosphatase 33 (L) 38 - 126 U/L   Total Bilirubin 1.0 0.3 - 1.2 mg/dL   GFR calc non Af Amer >60 >60 mL/min   GFR calc Af Amer >60 >60 mL/min   Anion gap 13 5 - 15    Comment:  Performed at [facility], [address] [place], [place] [address]    Current  Facility-Administered Medications  Medication Dose Route Frequency Provider Last Rate Last Dose  . gabapentin (NEURONTIN) capsule 300 mg  300 mg Oral TID Charm RingsLord, Jamison Y, NP   300 mg at 04/16/19 0215   Current Outpatient Medications  Medication Sig Dispense Refill  . ARIPiprazole (ABILIFY) 20 MG tablet Take 1 tablet (20 mg total) by mouth daily. (Patient not taking: Reported on 04/16/2019) 30 tablet 1  . ARIPiprazole ER (ABILIFY MAINTENA) 400 MG SRER injection Inject 2 mLs (400 mg total) into the muscle every 28 (twenty-eight) days. Next dose on 01/03/2018 (Patient not taking: Reported on 04/16/2019) 1 each 1  . traZODone (DESYREL) 100 MG tablet Take 1 tablet (100 mg total) by mouth at bedtime as needed for sleep. (Patient not taking: Reported on 04/16/2019) 30 tablet 1  . triamcinolone ointment (KENALOG) 0.1 % Apply 1 application topically daily.      Musculoskeletal: Strength & Muscle Tone: within normal limits Gait & Station: normal Patient leans: N/A  Psychiatric Specialty Exam: Physical Exam  Nursing note and vitals reviewed. Constitutional: She is oriented to person, place, and time. She appears well-developed and well-nourished.  HENT:  Head: Normocephalic.  Respiratory: Effort normal.  Musculoskeletal: Normal range of motion.  Neurological: She is alert and oriented to person, place, and time.  Psychiatric: She has a normal mood and affect. Her speech is normal and behavior is normal. Judgment and thought content normal. Cognition and memory are normal.    Review of Systems  Psychiatric/Behavioral: Positive for substance abuse.  All other systems reviewed and are negative.   Blood pressure (!) 118/59, pulse 98, temperature 99.1 F (37.3 C), temperature source Axillary, resp. rate 16, weight 61 kg, SpO2 100 %.Body mass index is 24.6 kg/m.  General Appearance: Disheveled  Eye Contact:   Fair  Speech:  Normal Rate  Volume:  Decreased  Mood:  "Fine"  Affect:  Disinterested and slightly somnolent  Thought Process:  Coherent and Descriptions of Associations: Intact  Orientation:  Full (Time, Place, and Person)  Thought Content:  WDL and Illogical  Suicidal Thoughts:  No  Homicidal Thoughts:  No  Memory:  Immediate;   Good Recent;   Good Remote;   Good  Judgement:  Other:  Limited  Insight:  Limited  Psychomotor Activity:  Decreased  Concentration:  Concentration: Good and Attention Span: Good  Recall:  Good  Fund of Knowledge:  Good  Language:  Good  Akathisia:  No  Handed:  Right  AIMS (if indicated):     Assets:  Housing Leisure Time Physical Health Resilience Social Support  ADL's:  Intact  Cognition:  WNL  Sleep:       Treatment Plan Summary: Cocaine abuse with cocaine induced mood disorder: -gabapentin 300 mg TID -Referred to IOP substance abuse  Disposition: No evidence of imminent risk to self or others at present.  May be discharged to home with her mother, does not meet inpatient psychiatric treatment criteria at this time.  Luciano CutterJustin R Stayce Delancy, DO 04/16/2019 10:22 AM

## 2019-04-16 NOTE — ED Notes (Signed)
Pt. Up using bathroom.  Pt. Calm and cooperative.  Pt. Requested and was given two cups of water and evening medication.  Pt. Asked what time it was and when breakfast would be served, answers given.  Pt. Laying back down to sleep.  Pt. Has no questions or concerns at this time.

## 2019-04-16 NOTE — ED Notes (Signed)
Pt discharged home with mother. VS stable. Denies pain. Denies SI. All belongings returned to patient. Pt signed for discharge paperwork.

## 2019-04-16 NOTE — ED Notes (Signed)
Pt given breakfast tray

## 2019-04-16 NOTE — ED Provider Notes (Signed)
-----------------------------------------   7:26 AM on 04/16/2019 -----------------------------------------   Blood pressure (!) 118/59, pulse 98, temperature 99.1 F (37.3 C), temperature source Axillary, resp. rate 16, weight 61 kg, SpO2 100 %.  The patient is calm and cooperative at this time.  There have been no acute events since the last update.  Awaiting disposition plan from Behavioral Medicine team.   Dionne Bucy, MD 04/16/19 [phone]

## 2019-04-25 ENCOUNTER — Other Ambulatory Visit: Payer: Self-pay

## 2019-04-25 ENCOUNTER — Emergency Department
Admission: EM | Admit: 2019-04-25 | Discharge: 2019-04-25 | Disposition: A | Discharge status: Home / Self Care | Payer: Medicaid Other | Attending: Student in an Organized Health Care Education/Training Program | Admitting: Student in an Organized Health Care Education/Training Program

## 2019-04-25 ENCOUNTER — Emergency Department: Payer: Medicaid Other

## 2019-04-25 ENCOUNTER — Encounter: Payer: Self-pay | Admitting: Emergency Medicine

## 2019-04-25 DIAGNOSIS — Z3A00 Weeks of gestation of pregnancy not specified: Secondary | ICD-10-CM | POA: Insufficient documentation

## 2019-04-25 DIAGNOSIS — O26899 Other specified pregnancy related conditions, unspecified trimester: Secondary | ICD-10-CM

## 2019-04-25 DIAGNOSIS — R102 Pelvic and perineal pain: Secondary | ICD-10-CM | POA: Insufficient documentation

## 2019-04-25 DIAGNOSIS — O9989 Other specified diseases and conditions complicating pregnancy, childbirth and the puerperium: Secondary | ICD-10-CM | POA: Insufficient documentation

## 2019-04-25 LAB — HCG, QUANTITATIVE, PREGNANCY: hCG, Beta Chain, Quant, S: 38536 m[IU]/mL — ABNORMAL HIGH (ref <5)

## 2019-04-25 NOTE — ED Triage Notes (Signed)
Pt here for RLQ abdominal pain. LMP in February, pt not sure when. No NVD. Unsure if pregnant. If she is pregnant pt would like to know if has ectopic. Denies SI/HI.

## 2019-04-25 NOTE — ED Notes (Signed)
Patient discharged home, patient di not received discharge papers, writer asked patient to give her a minute while check on another patient and patient left. Patient received belongings and verbalized she has received all of her belongings. Denies SI/HI [facility]. Vital signs taken. NAD noted.

## 2019-04-25 NOTE — ED Notes (Signed)
Patient taken to ultrasound.

## 2019-04-25 NOTE — ED Provider Notes (Signed)
[facility] Emergency Department Provider Note    First MD Initiated Contact with Patient 04/25/19 1117     (approximate)  I have reviewed the triage vital signs and the nursing notes.   HISTORY  Chief Complaint Abdominal Pain    HPI [name] is a 28 y.o. female presents the ER with below listed past medical history with concern for being pregnant and worried that she is having an ectopic.  When asked why she is worried she is having ectopic is because she has not had her "menstrual in 2 months "and that she previously had a ectopic pregnancy.  Denies any bleeding.  No discharge.  States the pain is been ongoing for several days and is mild.  States is constant.    Past Medical History:  Diagnosis Date  . Asthma   . Depression 2009   Inpatient psych admission for SI, dissociative fugue  . Dissociative disorder or reaction 2009  . Eczema   . H/O: suicide attempt   . ODD (oppositional defiant disorder)   . PTSD (post-traumatic stress disorder)   . Schizoaffective disorder (HCC)   . Substance abuse (HCC)    Family History  Adopted: Yes  Problem Relation Age of Onset  . Mental illness Mother   . Mental illness Father    Past Surgical History:  Procedure Laterality Date  . ADENOIDECTOMY    . TONSILLECTOMY     Patient Active Problem List   Diagnosis Date Noted  . Cocaine abuse with cocaine-induced mood disorder (HCC) 04/15/2019  . 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/03/2018  . Bacterial vaginosis 12/25/2017  . PTSD (post-traumatic stress disorder) 09/02/2017  . Bipolar disorder, curr episode mixed, severe, with psychotic features (HCC) 09/02/2017  . MRSA carrier 07/18/2017  . Bipolar I disorder, most recent episode mixed, severe with psychotic features (HCC) 07/18/2017  . Overdose 07/16/2017  . Aspiration pneumonia (HCC) 07/16/2017  . Acute respiratory failure (HCC) 07/16/2017  . Polysubstance abuse (HCC)  07/16/2017  . Eczema 05/08/2017  . Borderline personality disorder (HCC) 11/06/2016  . Cocaine use disorder, severe, dependence (HCC) 11/06/2016  . Cannabis use disorder, moderate, dependence (HCC) 11/06/2016  . Alcohol use disorder, mild, abuse 11/06/2016  . Asthma 09/23/2011  . Tobacco use disorder 09/15/2009      Prior to Admission medications   Medication Sig Start Date End Date Taking? Authorizing Provider  ARIPiprazole (ABILIFY) 20 MG tablet Take 1 tablet (20 mg total) by mouth daily. Patient not taking: Reported on 04/16/2019 12/07/18   Pucilowska, Braulio Conte B, MD  ARIPiprazole ER (ABILIFY MAINTENA) 400 MG SRER injection Inject 2 mLs (400 mg total) into the muscle every 28 (twenty-eight) days. Next dose on 01/03/2018 Patient not taking: Reported on 04/16/2019 12/07/18   Pucilowska, Braulio Conte B, MD  traZODone (DESYREL) 100 MG tablet Take 1 tablet (100 mg total) by mouth at bedtime as needed for sleep. Patient not taking: Reported on 04/16/2019 12/07/18   Pucilowska, Braulio Conte B, MD  triamcinolone ointment (KENALOG) 0.1 % Apply 1 application topically daily. 03/03/19   [provider]    Allergies Banana    Social History Social History   Tobacco Use  . Smoking status: Current Every Day Smoker    Packs/day: 0.25    Years: 2.00    Pack years: 0.50    Types: Cigarettes  . Smokeless tobacco: Never Used  Substance Use Topics  . Alcohol use: Yes    Alcohol/week: 1.0 standard drinks    Types: 1 Cans  of beer per week    Comment: weekly  . Drug use: Yes    Types: Marijuana, "Crack" cocaine, Heroin    Comment: 4 grams daily    Review of Systems Patient denies headaches, rhinorrhea, blurry vision, numbness, shortness of breath, chest pain, edema, cough, abdominal pain, nausea, vomiting, diarrhea, dysuria, fevers, rashes or hallucinations unless otherwise stated above in HPI. ____________________________________________   PHYSICAL EXAM:  VITAL SIGNS: Vitals:   04/25/19  1145 04/25/19 1445  BP: 105/84 98/72  Pulse: 77 80  Resp: 16 18  Temp: 98.5 F (36.9 C) 98.2 F (36.8 C)  SpO2: 99% 100%    Constitutional: Alert and oriented.  Eyes: Conjunctivae are normal.  Head: Atraumatic. Nose: No congestion/rhinnorhea. Mouth/Throat: Mucous membranes are moist.   Neck: No stridor. Painless ROM.  Cardiovascular: Normal rate, regular rhythm. Grossly normal heart sounds.  Good peripheral circulation. Respiratory: Normal respiratory effort.  No retractions. Lungs CTAB. Gastrointestinal: Soft and nontender. No distention. No abdominal bruits. No CVA tenderness. Genitourinary: deferred Musculoskeletal: No lower extremity tenderness nor edema.  No joint effusions. Neurologic:  Normal speech and language. No gross focal neurologic deficits are appreciated. No facial droop Skin:  Skin is warm, dry and intact. No rash noted. Psychiatric: Mood and affect are normal. ____________________________________________   LABS (all labs ordered are listed, but only abnormal results are displayed)  Results for orders placed or performed during the hospital encounter of 04/25/19 (from the past 24 hour(s))  hCG, quantitative, pregnancy     Status: Abnormal   Collection Time: 04/25/19 11:45 AM  Result Value Ref Range   hCG, Beta Chain, Quant, S 38,536 (H) <5 mIU/mL   ____________________________________________ ____________________________________________  RADIOLOGY  I personally reviewed all radiographic images ordered to evaluate for the above acute complaints and reviewed radiology reports and findings.  These findings were personally discussed with the patient.  Please see medical record for radiology report.  ____________________________________________   PROCEDURES  Procedure(s) performed:  Procedures    Critical Care performed: no ____________________________________________   INITIAL IMPRESSION / ASSESSMENT AND PLAN / ED COURSE  Pertinent labs & imaging  results that were available during my care of the patient were reviewed by me and considered in my medical decision making (see chart for details).   DDX: Pregnancy, miscarriage, ectopic, torsion, appendicitis  [name] Karie KirksShayan Helf is a 28 y.o. who presents to the ED with symptoms as described above.  Patient well-appearing hemodynamically stable.  No acute distress.  Abdominal exam is soft and benign.  Will check blood work to evaluate for  Clinical Course as of Apr 24 1604  Mon Apr 25, 2019  1210 Patient called me back over the bed side to tell me that she does not think that she is pregnant as she only has "sex with women."  However she wanted to make sure that if she is pregnant that I would get an ultrasound.   [PR]  1255 Patient is pregnant.  Will order ultrasound to further evaluate.   [PR]  1459 Ultrasound shows live IUP.  No evidence of ectopic.  Patient stable and appropriate for outpatient follow-up.  Patient encouraged to inform her psychiatrist that she is pregnant.   [PR]    Clinical Course User Index [PR] Willy Eddyobinson, Zenya Hickam, MD    The patient was evaluated in Emergency Department today for the symptoms described in the history of present illness. He/she was evaluated in the context of the global COVID-19 pandemic, which necessitated consideration that the patient might be  at risk for infection with the SARS-CoV-2 virus that causes COVID-19. Institutional protocols and algorithms that pertain to the evaluation of patients at risk for COVID-19 are in a state of rapid change based on information released by regulatory bodies including the CDC and federal and state organizations. These policies and algorithms were followed during the patient's care in the ED.  As part of my medical decision making, I reviewed the following data within the electronic MEDICAL RECORD NUMBER Nursing notes reviewed and incorporated, Labs reviewed, notes from prior ED visits and [place] Controlled Substance Database    ____________________________________________   FINAL CLINICAL IMPRESSION(S) / ED DIAGNOSES  Final diagnoses:  Pelvic pain affecting pregnancy      NEW MEDICATIONS STARTED DURING THIS VISIT:  Discharge Medication List as of 04/25/2019  3:00 PM       Note:  This document was prepared using Dragon voice recognition software and may include unintentional dictation errors.    Willy Eddy, MD 04/25/19 [phone]

## 2019-04-25 NOTE — Discharge Instructions (Addendum)
Please follow-up with OB/GYN.  Be sure to take your daily prenatal vitamins.  Return for any additional questions or concerns.

## 2019-04-25 NOTE — ED Notes (Signed)
Patient assigned to appropriate care area   Introduced self to pt  Patient oriented to unit/care area: Informed that, for their safety, care areas are designed for safety and visiting and phone hours explained to patient. Patient verbalizes understanding, and verbal contract for safety obtained  Environment secured  Patient is appropriate and cooperative, when asked why she was here by Clinical research associate patient stated that she was pregnant two months ago and is havin some abdominal pain. When asked by the MD if she was sexually active patient said she was not sure. Patient is adamant on staff drawing her blood and she does not want to give an urine sample.

## 2019-05-15 ENCOUNTER — Other Ambulatory Visit: Payer: Self-pay

## 2019-05-15 ENCOUNTER — Emergency Department
Admission: EM | Admit: 2019-05-15 | Discharge: 2019-05-15 | Disposition: A | Discharge status: Home / Self Care | Payer: Medicaid Other | Source: Home / Self Care

## 2019-05-15 ENCOUNTER — Emergency Department
Admission: EM | Admit: 2019-05-15 | Discharge: 2019-05-15 | Disposition: A | Discharge status: Home / Self Care | Payer: Medicaid Other | Attending: Emergency Medicine | Admitting: Emergency Medicine

## 2019-05-15 ENCOUNTER — Encounter: Payer: Self-pay | Admitting: Emergency Medicine

## 2019-05-15 DIAGNOSIS — R32 Unspecified urinary incontinence: Secondary | ICD-10-CM | POA: Insufficient documentation

## 2019-05-15 DIAGNOSIS — R42 Dizziness and giddiness: Secondary | ICD-10-CM | POA: Diagnosis not present

## 2019-05-15 DIAGNOSIS — Z5321 Procedure and treatment not carried out due to patient leaving prior to being seen by health care provider: Secondary | ICD-10-CM | POA: Diagnosis not present

## 2019-05-15 DIAGNOSIS — Z590 Homelessness: Secondary | ICD-10-CM | POA: Insufficient documentation

## 2019-05-15 DIAGNOSIS — Z3A00 Weeks of gestation of pregnancy not specified: Secondary | ICD-10-CM | POA: Diagnosis not present

## 2019-05-15 DIAGNOSIS — O9989 Other specified diseases and conditions complicating pregnancy, childbirth and the puerperium: Secondary | ICD-10-CM | POA: Diagnosis present

## 2019-05-15 DIAGNOSIS — O26899 Other specified pregnancy related conditions, unspecified trimester: Secondary | ICD-10-CM | POA: Diagnosis not present

## 2019-05-15 LAB — URINALYSIS, COMPLETE (UACMP) WITH MICROSCOPIC
Bacteria, UA: NONE SEEN
Bilirubin Urine: NEGATIVE
Glucose, UA: NEGATIVE mg/dL
Ketones, ur: NEGATIVE mg/dL
Nitrite: NEGATIVE
Protein, ur: 30 mg/dL — AB
Specific Gravity, Urine: 1.031 — ABNORMAL HIGH (ref 1.005–1.030)
pH: 5 (ref 5.0–8.0)
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
## 2019-05-15 NOTE — ED Notes (Signed)
Pt storming out the door.
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
## 2019-05-15 NOTE — ED Notes (Signed)
Pt says now she's probably had the abd pain and incontinence for 2 years or more

## 2019-05-15 NOTE — ED Notes (Signed)
Pt arrives via [facility] with c/o "feeling faint". Pt is x 3 months pregnant and is homeless. Pt states that she has had no prenatal care.

## 2019-05-24 ENCOUNTER — Emergency Department
Admission: EM | Admit: 2019-05-24 | Discharge: 2019-05-24 | Disposition: A | Discharge status: Home / Self Care | Payer: Medicaid Other | Attending: Emergency Medicine | Admitting: Emergency Medicine

## 2019-05-24 ENCOUNTER — Encounter: Payer: Self-pay | Admitting: Emergency Medicine

## 2019-05-24 ENCOUNTER — Other Ambulatory Visit: Payer: Self-pay

## 2019-05-24 DIAGNOSIS — O99719 Diseases of the skin and subcutaneous tissue complicating pregnancy, unspecified trimester: Secondary | ICD-10-CM | POA: Diagnosis present

## 2019-05-24 DIAGNOSIS — O99330 Smoking (tobacco) complicating pregnancy, unspecified trimester: Secondary | ICD-10-CM | POA: Diagnosis not present

## 2019-05-24 DIAGNOSIS — Z3490 Encounter for supervision of normal pregnancy, unspecified, unspecified trimester: Secondary | ICD-10-CM

## 2019-05-24 DIAGNOSIS — L0291 Cutaneous abscess, unspecified: Secondary | ICD-10-CM

## 2019-05-24 DIAGNOSIS — L02212 Cutaneous abscess of back [any part, except buttock]: Secondary | ICD-10-CM | POA: Insufficient documentation

## 2019-05-24 DIAGNOSIS — L309 Dermatitis, unspecified: Secondary | ICD-10-CM | POA: Diagnosis not present

## 2019-05-24 DIAGNOSIS — F17200 Nicotine dependence, unspecified, uncomplicated: Secondary | ICD-10-CM | POA: Insufficient documentation

## 2019-05-24 DIAGNOSIS — Z3A00 Weeks of gestation of pregnancy not specified: Secondary | ICD-10-CM | POA: Diagnosis not present

## 2019-05-24 MED ORDER — SULFAMETHOXAZOLE-TRIMETHOPRIM 800-160 MG PO TABS: 1.0000 | ORAL_TABLET | Freq: Two times a day (BID) | ORAL | 0 refills | Status: DC

## 2019-05-24 MED ORDER — CEPHALEXIN 500 MG PO CAPS: 500.0000 mg | ORAL_CAPSULE | Freq: Three times a day (TID) | ORAL | 0 refills | Status: AC

## 2019-05-24 MED ORDER — NYSTATIN 100000 UNIT/GM EX CREA: 1.0000 "application " | TOPICAL_CREAM | Freq: Three times a day (TID) | CUTANEOUS | 0 refills | Status: DC

## 2019-05-24 NOTE — ED Provider Notes (Signed)
[facility] Emergency Department Provider Note  ____________________________________________  Time seen: Approximately 2:15 PM  I have reviewed the triage vital signs and the nursing notes.   HISTORY  Chief Complaint Bump on back   HPI [name] is a 28 y.o. female who presents to the emergency department for what she believes is a "tumor."  She is noticed it 3 to 4 days ago.  She states that it is a tender area.  Somebody told her it was a pimple but she believes that it is a tumor.  She also complains of pain to bilateral hands and states that they are infected.  No alleviating measures attempted prior to arrival.   Also of note, patient is pregnant.  She is unsure when her last menstrual cycle occurred but states that it was "about 4 months ago."  She states that she has not felt her baby move and "unknown even know if it still alive in me."  No follow-up or OB/GYN appointment has been scheduled.  Past Medical History:  Diagnosis Date  . Asthma   . Depression 2009   Inpatient psych admission for SI, dissociative fugue  . Dissociative disorder or reaction 2009  . Eczema   . H/O: suicide attempt   . ODD (oppositional defiant disorder)   . PTSD (post-traumatic stress disorder)   . Schizoaffective disorder (HCC)   . Substance abuse [facility])     Patient Active Problem List   Diagnosis Date Noted  . Cocaine abuse with cocaine-induced mood disorder (HCC) 04/15/2019  . 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/03/2018  . Bacterial vaginosis 12/25/2017  . PTSD (post-traumatic stress disorder) 09/02/2017  . Bipolar disorder, curr episode mixed, severe, with psychotic features (HCC) 09/02/2017  . MRSA carrier 07/18/2017  . Bipolar I disorder, most recent episode mixed, severe with psychotic features (HCC) 07/18/2017  . Overdose 07/16/2017  . Aspiration pneumonia (HCC) 07/16/2017  . Acute respiratory failure (HCC) 07/16/2017  . Polysubstance  abuse (HCC) 07/16/2017  . Eczema 05/08/2017  . Borderline personality disorder (HCC) 11/06/2016  . Cocaine use disorder, severe, dependence (HCC) 11/06/2016  . Cannabis use disorder, moderate, dependence (HCC) 11/06/2016  . Alcohol use disorder, mild, abuse 11/06/2016  . Asthma 09/23/2011  . Tobacco use disorder 09/15/2009    Past Surgical History:  Procedure Laterality Date  . ADENOIDECTOMY    . TONSILLECTOMY      Prior to Admission medications   Medication Sig Start Date End Date Taking? Authorizing Provider  cephALEXin (KEFLEX) 500 MG capsule Take 1 capsule (500 mg total) by mouth 3 (three) times daily for 10 days. 05/24/19 06/03/19  Vihana Kydd, Rulon Eisenmenger B, FNP  nystatin cream (MYCOSTATIN) Apply 1 application topically 3 (three) times daily. 05/24/19   Chinita Pester, FNP    Allergies Banana and Cantaloupe (diagnostic)  Family History  Adopted: Yes  Problem Relation Age of Onset  . Mental illness Mother   . Mental illness Father     Social History Social History   Tobacco Use  . Smoking status: Current Every Day Smoker    Packs/day: 0.25    Years: 2.00    Pack years: 0.50    Types: Cigarettes  . Smokeless tobacco: Never Used  Substance Use Topics  . Alcohol use: Yes    Alcohol/week: 1.0 standard drinks    Types: 1 Cans of beer per week    Comment: weekly  . Drug use: Yes    Types: Marijuana, "Crack" cocaine, Heroin    Comment: 4  grams daily    Review of Systems  Constitutional: Negative for fever. Respiratory: Negative for cough or shortness of breath.  Musculoskeletal: Negative for myalgias Skin: Positive for lesion on the back and dorsal hands Neurological: Negative for numbness or paresthesias. ____________________________________________   PHYSICAL EXAM:  VITAL SIGNS: ED Triage Vitals  Enc Vitals Group     BP 05/24/19 1331 118/64     Pulse Rate 05/24/19 1329 85     Resp 05/24/19 1329 18     Temp 05/24/19 1329 98.5 F (36.9 C)     Temp Source  05/24/19 1329 Oral     SpO2 05/24/19 1329 100 %     Weight 05/24/19 1329 140 lb (63.5 kg)     Height 05/24/19 1329 5\' 6"  (1.676 m)     Head Circumference --      Peak Flow --      Pain Score 05/24/19 1329 10     Pain Loc --      Pain Edu? --      Excl. in GC? --      Constitutional: Well appearing. Eyes: Conjunctivae are clear without discharge or drainage. Nose: No rhinorrhea noted. Mouth/Throat: Airway is patent.  Neck: No stridor. Unrestricted range of motion observed. Cardiovascular: Capillary refill is <3 seconds.  Respiratory: Respirations are even and unlabored.. Musculoskeletal: Unrestricted range of motion observed. Neurologic: Awake, alert, and oriented x 4.  Skin: Open comedone appears on the back without any fluctuance or erythema.  There is some mild induration in the area.  Bilateral hands are in hospital gloves.  Hands are wet.  Skin is emaciated.  ____________________________________________   LABS (all labs ordered are listed, but only abnormal results are displayed)  Labs Reviewed - No data to display ____________________________________________  EKG  Not indicated. ____________________________________________  RADIOLOGY  Not indicated ____________________________________________   PROCEDURES  Procedures ____________________________________________   INITIAL IMPRESSION / ASSESSMENT AND PLAN / ED COURSE  [name] Karie KirksShayan Diebold is a 28 y.o. female who presents to the emergency department for evaluation of a skin lesion on her back and hands.  Patient states that she is wearing gloves 24/7 because she is scared of coronavirus.  Patient was advised that she needs to remove the gloves when she gets home and allow her hands to get some air.  She will be given a prescription for nystatin cream to be placed on both hands.  She will also be given a prescription for Keflex empirically for secondary infection.  She has had no abdominal pain or vaginal  bleeding.  Fetal heart rate is 132.  Patient was given verbal and written instructions to call [facility] to schedule an appointment.  Patient states that she understands and will call.  She was instructed to follow-up with primary care or return to the emergency department if her hands do not get better with the cream and antibiotics.  She was again advised to stop wearing gloves 24/7.  She was advised that if she is wearing gloves and her hands begin to sweat or she gets water in the gloves that she needs to take them off and dry her hands well.   Medications - No data to display   Pertinent labs & imaging results that were available during my care of the patient were reviewed by me and considered in my medical decision making (see chart for details).  ____________________________________________   FINAL CLINICAL IMPRESSION(S) / ED DIAGNOSES  Final diagnoses:  Abscess  Hand dermatitis  Pregnancy,  unspecified gestational age    ED Discharge Orders         Ordered    sulfamethoxazole-trimethoprim (BACTRIM DS) 800-160 MG tablet  2 times daily,   Status:  Discontinued     05/24/19 1420    nystatin cream (MYCOSTATIN)  3 times daily     05/24/19 1420    cephALEXin (KEFLEX) 500 MG capsule  3 times daily     05/24/19 1437           Note:  This document was prepared using Dragon voice recognition software and may include unintentional dictation errors.   Chinita Pester, FNP 05/25/19 1621    Sharman Cheek, MD 05/28/19 [phone]

## 2019-05-24 NOTE — ED Notes (Signed)
See triage note  Presents with possible abscess area to upper back

## 2019-05-24 NOTE — ED Triage Notes (Signed)
Pt here with c/o pain under her bra strap in the back, swelling and redness noted, hurting for 3 days now, approx the size of a blueberry. Pt states "I think it's a tumor." NAD.

## 2019-05-24 NOTE — Discharge Instructions (Addendum)
[facility] FOR AN APPOINTMENT!  Do not wear gloves 24/7. Remove them when you are home. Wash your hands often and dry them well afterward.  Follow up with your primary care provider in 2-3 days for a recheck.

## 2019-06-01 ENCOUNTER — Encounter: Payer: Medicaid Other | Admitting: Maternal Newborn

## 2019-06-13 ENCOUNTER — Other Ambulatory Visit: Payer: Self-pay

## 2019-06-13 ENCOUNTER — Encounter: Payer: Medicaid Other | Admitting: Maternal Newborn

## 2019-06-17 ENCOUNTER — Other Ambulatory Visit (HOSPITAL_COMMUNITY)
Admission: RE | Admit: 2019-06-17 | Discharge: 2019-06-17 | Disposition: A | Discharge status: Home / Self Care | Payer: Medicaid Other | Source: Ambulatory Visit | Attending: Certified Nurse Midwife | Admitting: Certified Nurse Midwife

## 2019-06-17 ENCOUNTER — Ambulatory Visit (INDEPENDENT_AMBULATORY_CARE_PROVIDER_SITE_OTHER): Payer: Medicaid Other | Admitting: Certified Nurse Midwife

## 2019-06-17 ENCOUNTER — Other Ambulatory Visit: Payer: Self-pay

## 2019-06-17 ENCOUNTER — Encounter: Payer: Self-pay | Admitting: Certified Nurse Midwife

## 2019-06-17 VITALS — BP 90/50 | Ht 66.0 in | Wt 133.6 lb

## 2019-06-17 DIAGNOSIS — O0992 Supervision of high risk pregnancy, unspecified, second trimester: Secondary | ICD-10-CM | POA: Diagnosis not present

## 2019-06-17 DIAGNOSIS — F250 Schizoaffective disorder, bipolar type: Secondary | ICD-10-CM

## 2019-06-17 DIAGNOSIS — O99322 Drug use complicating pregnancy, second trimester: Secondary | ICD-10-CM

## 2019-06-17 DIAGNOSIS — O0932 Supervision of pregnancy with insufficient antenatal care, second trimester: Secondary | ICD-10-CM | POA: Diagnosis not present

## 2019-06-17 DIAGNOSIS — Z124 Encounter for screening for malignant neoplasm of cervix: Secondary | ICD-10-CM | POA: Insufficient documentation

## 2019-06-17 DIAGNOSIS — Z3A20 20 weeks gestation of pregnancy: Secondary | ICD-10-CM

## 2019-06-17 DIAGNOSIS — F17200 Nicotine dependence, unspecified, uncomplicated: Secondary | ICD-10-CM

## 2019-06-17 DIAGNOSIS — Z113 Encounter for screening for infections with a predominantly sexual mode of transmission: Secondary | ICD-10-CM | POA: Diagnosis present

## 2019-06-17 DIAGNOSIS — O26892 Other specified pregnancy related conditions, second trimester: Secondary | ICD-10-CM | POA: Diagnosis not present

## 2019-06-17 DIAGNOSIS — O99332 Smoking (tobacco) complicating pregnancy, second trimester: Secondary | ICD-10-CM

## 2019-06-17 DIAGNOSIS — O0990 Supervision of high risk pregnancy, unspecified, unspecified trimester: Secondary | ICD-10-CM | POA: Insufficient documentation

## 2019-06-17 DIAGNOSIS — F1420 Cocaine dependence, uncomplicated: Secondary | ICD-10-CM

## 2019-06-17 DIAGNOSIS — N898 Other specified noninflammatory disorders of vagina: Secondary | ICD-10-CM | POA: Diagnosis not present

## 2019-06-17 DIAGNOSIS — F1910 Other psychoactive substance abuse, uncomplicated: Secondary | ICD-10-CM

## 2019-06-17 MED ORDER — PRENATE MINI 18-0.6-0.4-350 MG PO CAPS: 1.0000 | ORAL_CAPSULE | Freq: Every day | ORAL | 11 refills | Status: DC

## 2019-06-17 NOTE — Progress Notes (Signed)
NOB- pt thinks she has a skin infection, having trouble breathing, dizziness

## 2019-06-19 ENCOUNTER — Encounter: Payer: Self-pay | Admitting: Certified Nurse Midwife

## 2019-06-19 DIAGNOSIS — O0990 Supervision of high risk pregnancy, unspecified, unspecified trimester: Secondary | ICD-10-CM | POA: Insufficient documentation

## 2019-06-19 LAB — POCT WET PREP (WET MOUNT): Trichomonas Wet Prep HPF POC: ABSENT
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
## 2019-06-20 LAB — HEMOGLOBINOPATHY EVALUATION
HGB C: 0 %
HGB S: 0 %
HGB VARIANT: 0 %
Hemoglobin A2 Quantitation: 2.3 % (ref 1.8–3.2)
Hemoglobin F Quantitation: 0 % (ref 0.0–2.0)
Hgb A: 97.7 % (ref 96.4–98.8)

## 2019-06-20 LAB — RPR+RH+ABO+RUB AB+AB SCR+CB...
Antibody Screen: NEGATIVE
HIV Screen 4th Generation wRfx: NONREACTIVE
Hematocrit: 35.3 % (ref 34.0–46.6)
Hemoglobin: 11.8 g/dL (ref 11.1–15.9)
Hepatitis B Surface Ag: NEGATIVE
MCH: 31.3 pg (ref 26.6–33.0)
MCHC: 33.4 g/dL (ref 31.5–35.7)
MCV: 94 fL (ref 79–97)
Platelets: 249 10*3/uL (ref 150–450)
RBC: 3.77 x10E6/uL (ref 3.77–5.28)
RDW: 12.1 % (ref 11.7–15.4)
RPR Ser Ql: NONREACTIVE
Rh Factor: POSITIVE
Rubella Antibodies, IGG: <0.9 index — ABNORMAL LOW (ref >0.99)
Varicella zoster IgG: 1315 index (ref >165)
WBC: 9.9 10*3/uL (ref 3.4–10.8)

## 2019-06-20 LAB — CYTOLOGY - PAP
Chlamydia: NEGATIVE
Diagnosis: NEGATIVE
Neisseria Gonorrhea: NEGATIVE
Trichomonas: POSITIVE — AB

## 2019-06-21 ENCOUNTER — Telehealth: Payer: Self-pay

## 2019-06-22 ENCOUNTER — Other Ambulatory Visit: Payer: Self-pay | Admitting: Certified Nurse Midwife

## 2019-06-22 MED ORDER — METRONIDAZOLE 500 MG PO TABS: 2000.0000 mg | ORAL_TABLET | Freq: Once | ORAL | 0 refills | Status: AC

## 2019-06-22 NOTE — Telephone Encounter (Signed)
Called patient with lab results. Pap smear NIL with positive trichimonas. Will treat with Flagyl 2GM x 1 with food. Advised that sex partners also need to be treated. No alcohol. No IC x 1 week. Dalia Heading, CNM

## 2019-06-28 ENCOUNTER — Other Ambulatory Visit: Payer: Self-pay

## 2019-06-28 ENCOUNTER — Ambulatory Visit (INDEPENDENT_AMBULATORY_CARE_PROVIDER_SITE_OTHER): Payer: Medicaid Other | Admitting: Certified Nurse Midwife

## 2019-06-28 ENCOUNTER — Ambulatory Visit (INDEPENDENT_AMBULATORY_CARE_PROVIDER_SITE_OTHER): Payer: Medicaid Other

## 2019-06-28 VITALS — BP 110/62 | Wt 142.0 lb

## 2019-06-28 DIAGNOSIS — O35HXX0 Maternal care for other (suspected) fetal abnormality and damage, fetal lower extremities anomalies, not applicable or unspecified: Secondary | ICD-10-CM

## 2019-06-28 DIAGNOSIS — O0992 Supervision of high risk pregnancy, unspecified, second trimester: Secondary | ICD-10-CM

## 2019-06-28 DIAGNOSIS — O0990 Supervision of high risk pregnancy, unspecified, unspecified trimester: Secondary | ICD-10-CM

## 2019-06-28 DIAGNOSIS — F1910 Other psychoactive substance abuse, uncomplicated: Secondary | ICD-10-CM

## 2019-06-28 DIAGNOSIS — Z363 Encounter for antenatal screening for malformations: Secondary | ICD-10-CM | POA: Diagnosis not present

## 2019-06-28 DIAGNOSIS — Z3A22 22 weeks gestation of pregnancy: Secondary | ICD-10-CM

## 2019-06-28 DIAGNOSIS — O358XX0 Maternal care for other (suspected) fetal abnormality and damage, not applicable or unspecified: Secondary | ICD-10-CM

## 2019-06-28 NOTE — Progress Notes (Signed)
No vb. No lof anatomy scan today.  

## 2019-06-29 DIAGNOSIS — O358XX0 Maternal care for other (suspected) fetal abnormality and damage, not applicable or unspecified: Secondary | ICD-10-CM | POA: Insufficient documentation

## 2019-06-29 DIAGNOSIS — O35HXX0 Maternal care for other (suspected) fetal abnormality and damage, fetal lower extremities anomalies, not applicable or unspecified: Secondary | ICD-10-CM | POA: Insufficient documentation

## 2019-06-29 NOTE — Progress Notes (Signed)
HROB at [redacted] weeks gestation: Anatomy scan today: CGA 22wk1d. 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 Placenta posterior. Discussed finding with patient and need for treatment after birth. Probably will need referral to pediatric orthopedics at [facility].  Patient reports that she has not used cocaine since her last visit. UDS today. GIven numbers for [facility] and [employer]. Encouraged her to get appointment to help her with substance abuse and psychiatric problems. Saw the social worker today and was given her number. Encouraged to call her and discuss homeless situation and how she can prepare to care for baby/ will she be able to care for the baby? RTO 2 weeks. Will refer to [facility] for ultrasound to confirm club feet and discuss treatment/ and get recommendations for care (will discuss at her next appointment) Bridget Carpenter, [place], CNM

## 2019-06-30 ENCOUNTER — Other Ambulatory Visit: Payer: Self-pay

## 2019-06-30 ENCOUNTER — Encounter: Payer: Self-pay | Admitting: Emergency Medicine

## 2019-06-30 ENCOUNTER — Emergency Department
Admission: EM | Admit: 2019-06-30 | Discharge: 2019-06-30 | Disposition: A | Discharge status: Home / Self Care | Payer: Medicaid Other | Attending: Emergency Medicine | Admitting: Emergency Medicine

## 2019-06-30 DIAGNOSIS — F17210 Nicotine dependence, cigarettes, uncomplicated: Secondary | ICD-10-CM | POA: Insufficient documentation

## 2019-06-30 DIAGNOSIS — B86 Scabies: Secondary | ICD-10-CM | POA: Diagnosis not present

## 2019-06-30 DIAGNOSIS — J45909 Unspecified asthma, uncomplicated: Secondary | ICD-10-CM | POA: Insufficient documentation

## 2019-06-30 DIAGNOSIS — L309 Dermatitis, unspecified: Secondary | ICD-10-CM

## 2019-06-30 DIAGNOSIS — R21 Rash and other nonspecific skin eruption: Secondary | ICD-10-CM | POA: Diagnosis present

## 2019-06-30 DIAGNOSIS — Z79899 Other long term (current) drug therapy: Secondary | ICD-10-CM | POA: Diagnosis not present

## 2019-06-30 DIAGNOSIS — L259 Unspecified contact dermatitis, unspecified cause: Secondary | ICD-10-CM | POA: Insufficient documentation

## 2019-06-30 LAB — URINE DRUG PANEL 7
Amphetamines, Urine: NEGATIVE ng/mL
Barbiturate Quant, Ur: NEGATIVE ng/mL
Benzodiazepine Quant, Ur: NEGATIVE ng/mL
Cannabinoid Quant, Ur: NEGATIVE ng/mL
Cocaine (Metab.): NEGATIVE ng/mL
Opiate Quant, Ur: NEGATIVE ng/mL
PCP Quant, Ur: NEGATIVE ng/mL

## 2019-06-30 MED ORDER — CEPHALEXIN 500 MG PO CAPS: 500.0000 mg | ORAL_CAPSULE | Freq: Three times a day (TID) | ORAL | 0 refills | Status: DC

## 2019-06-30 MED ORDER — PERMETHRIN 5 % EX CREA: TOPICAL_CREAM | CUTANEOUS | 1 refills | Status: DC

## 2019-06-30 NOTE — ED Notes (Signed)
See triage note  Presents with "rash" to both hands   Eczema noted to both hands  States she needs something for same

## 2019-06-30 NOTE — ED Triage Notes (Addendum)
Pt c/o rash that appeared over night. Pt states rash is itchy at this time. Pt presents to ED with rash to bilateral hands at this time.

## 2019-06-30 NOTE — ED Notes (Signed)
Pt ambulatory to vending machines in main lobby, states she will return to room.

## 2019-06-30 NOTE — Discharge Instructions (Signed)
Please follow the instructions on how to prevent spread of scabies. You may take Benadryl for the itch.  Take the antibiotic for your hands.  Return to the ER for symptoms that change or worsen or for new concerns.

## 2019-07-01 LAB — URINE CULTURE

## 2019-07-02 ENCOUNTER — Encounter: Payer: Self-pay | Admitting: Emergency Medicine

## 2019-07-02 ENCOUNTER — Observation Stay
Admit: 2019-07-02 | Discharge: 2019-07-02 | Disposition: A | Discharge status: Home / Self Care | Payer: Medicaid Other | Source: Ambulatory Visit | Attending: Obstetrics and Gynecology | Admitting: Obstetrics and Gynecology

## 2019-07-02 ENCOUNTER — Emergency Department
Admission: EM | Admit: 2019-07-02 | Discharge: 2019-07-02 | Disposition: A | Discharge status: Home / Self Care | Payer: Medicaid Other | Source: Home / Self Care | Attending: Emergency Medicine | Admitting: Emergency Medicine

## 2019-07-02 ENCOUNTER — Other Ambulatory Visit: Payer: Self-pay

## 2019-07-02 DIAGNOSIS — F17210 Nicotine dependence, cigarettes, uncomplicated: Secondary | ICD-10-CM | POA: Insufficient documentation

## 2019-07-02 DIAGNOSIS — O368190 Decreased fetal movements, unspecified trimester, not applicable or unspecified: Secondary | ICD-10-CM | POA: Diagnosis present

## 2019-07-02 DIAGNOSIS — O321XX0 Maternal care for breech presentation, not applicable or unspecified: Secondary | ICD-10-CM | POA: Insufficient documentation

## 2019-07-02 DIAGNOSIS — O99332 Smoking (tobacco) complicating pregnancy, second trimester: Secondary | ICD-10-CM | POA: Diagnosis not present

## 2019-07-02 DIAGNOSIS — L03119 Cellulitis of unspecified part of limb: Secondary | ICD-10-CM | POA: Insufficient documentation

## 2019-07-02 DIAGNOSIS — O0990 Supervision of high risk pregnancy, unspecified, unspecified trimester: Secondary | ICD-10-CM

## 2019-07-02 DIAGNOSIS — O368120 Decreased fetal movements, second trimester, not applicable or unspecified: Secondary | ICD-10-CM | POA: Diagnosis not present

## 2019-07-02 DIAGNOSIS — L301 Dyshidrosis [pompholyx]: Secondary | ICD-10-CM | POA: Insufficient documentation

## 2019-07-02 DIAGNOSIS — Z3A22 22 weeks gestation of pregnancy: Secondary | ICD-10-CM | POA: Diagnosis not present

## 2019-07-02 DIAGNOSIS — J45909 Unspecified asthma, uncomplicated: Secondary | ICD-10-CM | POA: Insufficient documentation

## 2019-07-02 MED ORDER — CEPHALEXIN 500 MG PO CAPS: 500.0000 mg | ORAL_CAPSULE | Freq: Four times a day (QID) | ORAL | 0 refills | Status: DC

## 2019-07-02 MED ORDER — CEPHALEXIN 500 MG PO CAPS
500.0000 mg | ORAL_CAPSULE | Freq: Once | ORAL | Status: AC
  Administered 2019-07-02: 500 mg via ORAL
  Filled 2019-07-02: qty 1

## 2019-07-02 MED ORDER — TRIAMCINOLONE ACETONIDE 0.1 % EX CREA: 1.0000 "application " | TOPICAL_CREAM | Freq: Four times a day (QID) | CUTANEOUS | 3 refills | Status: DC
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
## 2019-07-02 NOTE — ED Notes (Signed)
L & D called to give brief report regarding pt and the plan for transporting her upstairs to be evaluated for amniotic fluid leakage.

## 2019-07-02 NOTE — OB Triage Note (Signed)
Pt states she wants a ultrasound due to stress.  Pt reports LOF unsure of for how long or color and no bleeding.  PT reports no fetal movement and unsure of last movement.  Pt reports no ctx.  Pt states under her breath " I want to die" "Someone is trying to kill my baby".  When pt asked if she wanted to harm herself or others she stated "no".  When Pt asked if she felt safe she stated "yes".  FHT Dopplered at 140.

## 2019-07-02 NOTE — ED Provider Notes (Signed)
[facility] Emergency Department Provider Note  ____________________________________________  Time seen: Approximately 8:04 PM  I have reviewed the triage vital signs and the nursing notes.   HISTORY  Chief Complaint Rash    HPI Bridget Carpenter is a 28 y.o. female who presents emergency department for evaluation of rash to bilateral hands.  Patient has a significant history of eczema but has significantly worsened to bilateral hands.  Patient reports that these are cracked, oozing pus.  Patient had been prescribed Keflex but lost the prescription.  She does not take any medications for his complaint prior to arrival.  Patient is 5 months pregnant, has leaking clear vaginal fluid.  Patient is to be cleared with her rash to bilateral hands before being sent upstairs for OB evaluation.  Patient has a significant mental illness and substance abuse history to include oppositional defiant disorder, posttraumatic stress disorder, schizoaffective disorder, psychosis, bipolar disorder.         Past Medical History:  Diagnosis Date  . Asthma   . Depression 2009   Inpatient psych admission for SI, dissociative fugue  . Dissociative disorder or reaction 2009  . Eczema   . H/O: suicide attempt   . ODD (oppositional defiant disorder)   . PTSD (post-traumatic stress disorder)   . Schizoaffective disorder (HCC)   . Substance abuse [facility](HCC)     Patient Active Problem List   Diagnosis Date Noted  . Club foot, fetal, affecting care of mother, antepartum, single gestation 06/29/2019  . Supervision of high risk pregnancy, antepartum 06/19/2019  . Cocaine abuse with cocaine-induced mood disorder (HCC) 04/15/2019  . 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/03/2018  . Bacterial vaginosis 12/25/2017  . Schizoaffective disorder, bipolar type (HCC) 09/02/2017  . PTSD (post-traumatic stress disorder) 09/02/2017  . Bipolar disorder, curr episode mixed, severe,  with psychotic features (HCC) 09/02/2017  . MRSA carrier 07/18/2017  . Bipolar I disorder, most recent episode mixed, severe with psychotic features (HCC) 07/18/2017  . Overdose 07/16/2017  . Aspiration pneumonia (HCC) 07/16/2017  . Acute respiratory failure (HCC) 07/16/2017  . Polysubstance abuse (HCC) 07/16/2017  . Eczema 05/08/2017  . Borderline personality disorder (HCC) 11/06/2016  . Cocaine use disorder, severe, dependence (HCC) 11/06/2016  . Cannabis use disorder, moderate, dependence (HCC) 11/06/2016  . Alcohol use disorder, mild, abuse 11/06/2016  . Asthma 09/23/2011  . Tobacco use disorder 09/15/2009    Past Surgical History:  Procedure Laterality Date  . ADENOIDECTOMY    . TONSILLECTOMY      Prior to Admission medications   Medication Sig Start Date End Date Taking? Authorizing Provider  cephALEXin (KEFLEX) 500 MG capsule Take 1 capsule (500 mg total) by mouth 4 (four) times daily. 07/02/19   Cuthriell, Delorise RoyalsJonathan D, PA-C  permethrin (ELIMITE) 5 % cream Apply from forehead to soles of feet, sparing the area around the eyes and leave on 10 hours, then shower off. Repeat in 1 week. 06/30/19   Triplett, Cari B, FNP  Prenat-FeCbn-FeAsp-Meth-FA-DHA (PRENATE MINI) 18-0.6-0.4-350 MG CAPS Take 1 capsule by mouth daily. 06/17/19   Farrel ConnersGutierrez, Colleen, CNM  triamcinolone cream (KENALOG) 0.1 % Apply 1 application topically 4 (four) times daily. 07/02/19   Cuthriell, Delorise RoyalsJonathan D, PA-C    Allergies Banana, Grapefruit extract, and Cantaloupe (diagnostic)  Family History  Adopted: Yes  Problem Relation Age of Onset  . Mental illness Mother   . Mental illness Father   . Heart disease Maternal Grandmother   . Sickle cell trait Adoptive Mother   .  Cleft palate Cousin        maternal side    Social History Social History   Tobacco Use  . Smoking status: Current Every Day Smoker    Packs/day: 0.75    Years: 2.00    Pack years: 1.50    Types: Cigarettes  . Smokeless tobacco: Never  Used  Substance Use Topics  . Alcohol use: Not Currently    Alcohol/week: 1.0 standard drinks    Types: 1 Cans of beer per week    Comment: weekly  . Drug use: Yes    Types: Marijuana, "Crack" cocaine, Heroin    Comment: 4 grams daily     Review of Systems  Constitutional: No fever/chills Eyes: No visual changes. No discharge ENT: No upper respiratory complaints. Cardiovascular: no chest pain. Respiratory: no cough. No SOB. Gastrointestinal: No abdominal pain.  No nausea, no vomiting.  No diarrhea.  No constipation. Genitourinary: Negative for dysuria. No hematuria.  Positive for leaking clear fluid from vagina.  Patient is 5 months pregnant. Musculoskeletal: Negative for musculoskeletal pain. Skin: Negative for rash, abrasions, lacerations, ecchymosis. Neurological: Negative for headaches, focal weakness or numbness. 10-point ROS otherwise negative.  ____________________________________________   PHYSICAL EXAM:  VITAL SIGNS: ED Triage Vitals  Enc Vitals Group     BP 07/02/19 1831 110/68     Pulse Rate 07/02/19 1831 78     Resp 07/02/19 1831 18     Temp 07/02/19 1831 98.1 F (36.7 C)     Temp Source 07/02/19 1831 Oral     SpO2 07/02/19 1831 100 %     Weight 07/02/19 1833 150 lb (68 kg)     Height 07/02/19 1833 5\' 6"  (1.676 m)     Head Circumference --      Peak Flow --      Pain Score 07/02/19 1833 10     Pain Loc --      Pain Edu? --      Excl. in [place]? --      Constitutional: Alert and oriented. Well appearing and in no acute distress. Eyes: Conjunctivae are normal. PERRL. EOMI. Head: Atraumatic. ENT:      Ears:       Nose: No congestion/rhinnorhea.      Mouth/Throat: Mucous membranes are moist.  Neck: No stridor.    Cardiovascular: Normal rate, regular rhythm. Normal S1 and S2.  Good peripheral circulation. Respiratory: Normal respiratory effort without tachypnea or retractions. Lungs CTAB. Good air entry to the bases with no decreased or absent breath  sounds. Gastrointestinal: Gravid abdomen.  Bowel sounds 4 quadrants. Soft and nontender to palpation. No guarding or rigidity. No palpable masses. No distention. No CVA tenderness. Musculoskeletal: Full range of motion to all extremities. No gross deformities appreciated. Neurologic:  Normal speech and language. No gross focal neurologic deficits are appreciated.  Skin:  Skin is warm, dry and intact. No rash noted.  Significant eczema noted to bilateral upper extremities, abdomen, legs.  Of note, patient has significant dyshidrotic eczema to the dorsal aspect of the hand with cracking, these cracks are expressing purulent drainage.  Areas are minimally tender to palpation.  No evidence of underlying abscess.  Full range of motion all 5 digits bilateral hands.  Sensation and cap refill intact all digits bilateral hands. Psychiatric: Mood and affect are normal. Speech and behavior are normal. Patient exhibits appropriate insight and judgement.   ____________________________________________   LABS (all labs ordered are listed, but only abnormal results are displayed)  Labs  Reviewed - No data to display ____________________________________________  EKG   ____________________________________________  RADIOLOGY   No results found.  ____________________________________________    PROCEDURES  Procedure(s) performed:    Procedures    Medications - No data to display   ____________________________________________   INITIAL IMPRESSION / ASSESSMENT AND PLAN / ED COURSE  Pertinent labs & imaging results that were available during my care of the patient were reviewed by me and considered in my medical decision making (see chart for details).  Review of the [place] CSRS was performed in accordance of the [facility] prior to dispensing any controlled drugs.           Patient's diagnosis is consistent with dyshidrotic eczema with overlying cellulitis.  Patient presented to emerge with  significant dyshidrotic eczema to the hands.  This is showing signs of overlying cellulitis.  No indication of underlying abscess.  Patient needed medical clearance for rash [facility] while [facility].  Patient was not evaluated for this complaint and she will be discharged from the ED, [facility]  Prescriptions of triamcinolone and Keflex are prescribed.  [facility]  Patient is given ED precautions to return to the ED for any worsening or new symptoms.     ____________________________________________  FINAL CLINICAL IMPRESSION(S) / ED DIAGNOSES  Final diagnoses:  Dyshidrotic eczema  Cellulitis of upper extremity, unspecified laterality      NEW MEDICATIONS STARTED DURING THIS VISIT:  ED Discharge Orders         Ordered    triamcinolone cream (KENALOG) 0.1 %  4 times daily     07/02/19 2027    cephALEXin (KEFLEX) 500 MG capsule  4 times daily     07/02/19 2027              This chart was dictated using voice recognition software/Dragon. Despite best efforts to proofread, errors can occur which can change the meaning. Any change was purely unintentional.    Racheal PatchesCuthriell, Jonathan D, PA-C 07/02/19 2033    Jeanmarie PlantMcShane, James A, MD 07/02/19 2251

## 2019-07-02 NOTE — ED Triage Notes (Signed)
Patient states she has dried cracked rash back of hands. Itchy and burning. Patient also states is approx 5 months pregnant and wishes to be seen due to leaking fluid. Donald Prose in [facility] contacted and states patient to be seen for rash first.

## 2019-07-03 NOTE — Discharge Summary (Signed)
See final progress note. 

## 2019-07-04 NOTE — ED Provider Notes (Signed)
[facility] Emergency Department Provider Note  ____________________________________________  Time seen: Approximately 5:19 PM  I have reviewed the triage vital signs and the nursing notes.   HISTORY  Chief Complaint Rash   HPI Bridget Carpenter is a 28 y.o. female who presents to the emergency department for treatment and evaluation of rash to her hands and body. She noticed the rash this morning. She also has chronic dermatitis on both hands and believes she has some infection because they "are pusing out." She is 5 months pregnant. She denies any pregnancy related complaints today.   Past Medical History:  Diagnosis Date  . Asthma   . Depression 2009   Inpatient psych admission for SI, dissociative fugue  . Dissociative disorder or reaction 2009  . Eczema   . H/O: suicide attempt   . ODD (oppositional defiant disorder)   . PTSD (post-traumatic stress disorder)   . Schizoaffective disorder ([place])   . Substance abuse [facility])     Patient Active Problem List   Diagnosis Date Noted  . Decreased fetal movement 07/02/2019  . Club foot, fetal, affecting care of mother, antepartum, single gestation 06/29/2019  . Supervision of high risk pregnancy, antepartum 06/19/2019  . Cocaine abuse with cocaine-induced mood disorder ([place]) 04/15/2019  . Psychoactive substance-induced psychosis ([place]) 05/03/2018  . Bacterial vaginosis 12/25/2017  . Schizoaffective disorder, bipolar type ([place]) 09/02/2017  . PTSD (post-traumatic stress disorder) 09/02/2017  . Bipolar disorder, curr episode mixed, severe, with psychotic features ([place]) 09/02/2017  . MRSA carrier 07/18/2017  . Bipolar I disorder, most recent episode mixed, severe with psychotic features ([place]) 07/18/2017  . Overdose 07/16/2017  . Aspiration pneumonia ([place]) 07/16/2017  . Acute respiratory failure ([place]) 07/16/2017  . Polysubstance abuse ([place]) 07/16/2017  . Eczema 05/08/2017  . Borderline personality  disorder ([place]) 11/06/2016  . Cocaine use disorder, severe, dependence ([place]) 11/06/2016  . Cannabis use disorder, moderate, dependence ([place]) 11/06/2016  . Alcohol use disorder, mild, abuse 11/06/2016  . Asthma 09/23/2011  . Tobacco use disorder 09/15/2009    Past Surgical History:  Procedure Laterality Date  . ADENOIDECTOMY    . TONSILLECTOMY      Prior to Admission medications   Medication Sig Start Date End Date Taking? Authorizing Provider  cephALEXin (KEFLEX) 500 MG capsule Take 1 capsule (500 mg total) by mouth 4 (four) times daily. 07/02/19   Cuthriell, Charline Bills, PA-C  permethrin (ELIMITE) 5 % cream Apply from forehead to soles of feet, sparing the area around the eyes and leave on 10 hours, then shower off. Repeat in 1 week. 06/30/19   Rakisha Pincock B, FNP  Prenat-FeCbn-FeAsp-Meth-FA-DHA (PRENATE MINI) 18-0.6-0.4-350 MG CAPS Take 1 capsule by mouth daily. 06/17/19   Dalia Heading, CNM  triamcinolone cream (KENALOG) 0.1 % Apply 1 application topically 4 (four) times daily. 07/02/19   Cuthriell, Charline Bills, PA-C    Allergies Banana, Grapefruit extract, and Cantaloupe (diagnostic)  Family History  Adopted: Yes  Problem Relation Age of Onset  . Mental illness Mother   . Mental illness Father   . Heart disease Maternal Grandmother   . Sickle cell trait Adoptive Mother   . Cleft palate Cousin        maternal side    Social History Social History   Tobacco Use  . Smoking status: Current Every Day Smoker    Packs/day: 0.75    Years: 2.00    Pack years: 1.50    Types: Cigarettes  . Smokeless tobacco: Never Used  Substance Use Topics  . Alcohol use: Not Currently    Alcohol/week: 1.0 standard drinks    Types: 1 Cans of beer per week    Comment: weekly  . Drug use: Yes    Types: Marijuana, "Crack" cocaine, Heroin    Comment: 4 grams daily    Review of Systems  Constitutional: Negative for fever. Respiratory: Negative for cough or shortness of breath.   Musculoskeletal: Negative for myalgias Skin: Positive for rash and dermatitis. Neurological: Negative for numbness or paresthesias. ____________________________________________   PHYSICAL EXAM:  VITAL SIGNS: ED Triage Vitals  Enc Vitals Group     BP 06/30/19 1219 (!) 95/59     Pulse Rate 06/30/19 1219 74     Resp 06/30/19 1219 18     Temp 06/30/19 1219 98.5 F (36.9 C)     Temp Source 06/30/19 1219 Oral     SpO2 06/30/19 1219 99 %     Weight 06/30/19 1216 142 lb (64.4 kg)     Height 06/30/19 1216 5\' 6"  (1.676 m)     Head Circumference --      Peak Flow --      Pain Score 06/30/19 1220 9     Pain Loc --      Pain Edu? --      Excl. in [place]? --      Constitutional: Chronically ill appearing. Eyes: Conjunctivae are clear without discharge or drainage. Nose: No rhinorrhea noted. Mouth/Throat: Airway is patent.  Neck: No stridor. Unrestricted range of motion observed. Cardiovascular: Capillary refill is <3 seconds.  Respiratory: Respirations are even and unlabored.. Musculoskeletal: Unrestricted range of motion observed. Neurologic: Awake, alert, and oriented x 4.  Skin:  Skin colored, maculopapular rash over hands between webs of fingers and extremities.  ____________________________________________   LABS (all labs ordered are listed, but only abnormal results are displayed)  Labs Reviewed - No data to display ____________________________________________  EKG  Not indicated. ____________________________________________  RADIOLOGY  Not indicated. ____________________________________________   PROCEDURES  Procedures ____________________________________________   INITIAL IMPRESSION / ASSESSMENT AND PLAN / ED COURSE  [name] is a 28 y.o. female who presents to the emergency department for treatment and evaluation of rash and dermatitis. She will be treated with permethrin.  Not convinced that her hands are infected, but because she is pregnant  and she complains that she sees yellow drainage she will be covered with Keflex.  She was advised to follow-up with her OB/GYN as scheduled.  She is to return to the emergency department for symptoms of change or worsen if she is unable to schedule appointment.   Medications - No data to display   Pertinent labs & imaging results that were available during my care of the patient were reviewed by me and considered in my medical decision making (see chart for details).  ____________________________________________   FINAL CLINICAL IMPRESSION(S) / ED DIAGNOSES  Final diagnoses:  Hand dermatitis  Scabies    ED Discharge Orders         Ordered    permethrin (ELIMITE) 5 % cream     06/30/19 1331    cephALEXin (KEFLEX) 500 MG capsule  3 times daily,   Status:  Discontinued     06/30/19 1331           Note:  This document was prepared using Dragon voice recognition software and may include unintentional dictation errors.   Chinita Pesterriplett, Aleya Durnell B, FNP 07/04/19 1727    Jeanmarie PlantMcShane, James A, MD 07/18/19  1522  

## 2019-07-05 ENCOUNTER — Emergency Department
Admission: EM | Admit: 2019-07-05 | Discharge: 2019-07-05 | Disposition: A | Discharge status: Home / Self Care | Payer: Medicaid Other

## 2019-07-10 ENCOUNTER — Emergency Department
Admission: EM | Admit: 2019-07-10 | Discharge: 2019-07-12 | Disposition: A | Discharge status: Home / Self Care | Payer: Medicaid Other | Attending: Emergency Medicine | Admitting: Emergency Medicine

## 2019-07-10 ENCOUNTER — Other Ambulatory Visit: Payer: Self-pay

## 2019-07-10 ENCOUNTER — Encounter: Payer: Self-pay | Admitting: Emergency Medicine

## 2019-07-10 DIAGNOSIS — Z3A20 20 weeks gestation of pregnancy: Secondary | ICD-10-CM | POA: Insufficient documentation

## 2019-07-10 DIAGNOSIS — Z91018 Allergy to other foods: Secondary | ICD-10-CM | POA: Diagnosis not present

## 2019-07-10 DIAGNOSIS — O99342 Other mental disorders complicating pregnancy, second trimester: Secondary | ICD-10-CM | POA: Insufficient documentation

## 2019-07-10 DIAGNOSIS — F17210 Nicotine dependence, cigarettes, uncomplicated: Secondary | ICD-10-CM | POA: Diagnosis not present

## 2019-07-10 DIAGNOSIS — F29 Unspecified psychosis not due to a substance or known physiological condition: Secondary | ICD-10-CM

## 2019-07-10 DIAGNOSIS — F250 Schizoaffective disorder, bipolar type: Secondary | ICD-10-CM | POA: Diagnosis not present

## 2019-07-10 DIAGNOSIS — J45909 Unspecified asthma, uncomplicated: Secondary | ICD-10-CM | POA: Diagnosis not present

## 2019-07-10 DIAGNOSIS — Z79899 Other long term (current) drug therapy: Secondary | ICD-10-CM | POA: Diagnosis not present

## 2019-07-10 DIAGNOSIS — O99512 Diseases of the respiratory system complicating pregnancy, second trimester: Secondary | ICD-10-CM | POA: Diagnosis not present

## 2019-07-10 LAB — CBC
HCT: 35.5 % — ABNORMAL LOW (ref 36.0–46.0)
Hemoglobin: 11.5 g/dL — ABNORMAL LOW (ref 12.0–15.0)
MCH: 31.7 pg (ref 26.0–34.0)
MCHC: 32.4 g/dL (ref 30.0–36.0)
MCV: 97.8 fL (ref 80.0–100.0)
Platelets: 208 10*3/uL (ref 150–400)
RBC: 3.63 MIL/uL — ABNORMAL LOW (ref 3.87–5.11)
RDW: 12.3 % (ref 11.5–15.5)
WBC: 8 10*3/uL (ref 4.0–10.5)
nRBC: 0 % (ref 0.0–0.2)

## 2019-07-10 LAB — COMPREHENSIVE METABOLIC PANEL
ALT: 11 U/L (ref 0–44)
AST: 22 U/L (ref 15–41)
Albumin: 3.4 g/dL — ABNORMAL LOW (ref 3.5–5.0)
Alkaline Phosphatase: 35 U/L — ABNORMAL LOW (ref 38–126)
Anion gap: 9 (ref 5–15)
BUN: 12 mg/dL (ref 6–20)
CO2: 21 mmol/L — ABNORMAL LOW (ref 22–32)
Calcium: 8.7 mg/dL — ABNORMAL LOW (ref 8.9–10.3)
Chloride: 108 mmol/L (ref 98–111)
Creatinine, Ser: 0.5 mg/dL (ref 0.44–1.00)
GFR calc Af Amer: >60 mL/min (ref >60)
GFR calc non Af Amer: >60 mL/min (ref >60)
Glucose, Bld: 79 mg/dL (ref 70–99)
Potassium: 3.5 mmol/L (ref 3.5–5.1)
Sodium: 138 mmol/L (ref 135–145)
Total Bilirubin: 0.7 mg/dL (ref 0.3–1.2)
Total Protein: 6.9 g/dL (ref 6.5–8.1)

## 2019-07-10 LAB — ACETAMINOPHEN LEVEL: Acetaminophen (Tylenol), Serum: <10 ug/mL — ABNORMAL LOW (ref 10–30)

## 2019-07-10 LAB — SALICYLATE LEVEL: Salicylate Lvl: <7 mg/dL (ref 2.8–30.0)

## 2019-07-10 LAB — ETHANOL: Alcohol, Ethyl (B): <10 mg/dL (ref <10)

## 2019-07-10 LAB — HCG, QUANTITATIVE, PREGNANCY: hCG, Beta Chain, Quant, S: 7258 m[IU]/mL — ABNORMAL HIGH (ref <5)

## 2019-07-10 MED ORDER — HALOPERIDOL LACTATE 5 MG/ML IJ SOLN: 5.0000 mg | Freq: Once | INTRAMUSCULAR | Status: DC

## 2019-07-10 NOTE — ED Notes (Signed)
Pt was dressed out after much, much coaxing and verbal de-escalation by Alma Friendly EDT. Pt had been verbally aggressive while in 19H, so she was moved to 21.

## 2019-07-10 NOTE — ED Notes (Signed)
This RN attempted to draw blood unsuccessful. Lab called.

## 2019-07-10 NOTE — ED Notes (Addendum)
Pt here because she states "it was hot outside and I feel like the inside of my body was in flames.  I was [facility] room [facility] but not today and it was hot".  Denies SI/HI.  Oriented X 3. " I am homeless and don't want to get sent to a shelter to die of covid 19 instead of the sun".  Pt is pregnant and reports gets care at [facility] but not sure last time she saw them.  Pt reports all sx are gone now that inside.

## 2019-07-10 NOTE — ED Triage Notes (Signed)
Pt to ED via [facility] from [facility] for being "hot". Pt states that she has been feeling hot for a while. Pt states that she has headache and feels like she is going to die. Pt also wanting to be seen for her skin. Pt has hx/o eczema.
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
## 2019-07-10 NOTE — ED Notes (Signed)
Fetal HR in triage was 148 BPM.

## 2019-07-10 NOTE — ED Notes (Signed)
Pt changed into scrubs and belongings secured. Boots, jeans, $17 cash, cell phone, red underwear, shirt.

## 2019-07-11 NOTE — ED Notes (Signed)
BEHAVIORAL HEALTH ROUNDING Patient sleeping: No. Patient alert and oriented: yes Behavior appropriate: Yes.  ; If no, describe:  Nutrition and fluids offered: yes Toileting and hygiene offered: Yes  Sitter present: q15 minute observations and security camera monitoring Law enforcement present: Yes  ODS  

## 2019-07-11 NOTE — ED Notes (Signed)
Report to [facility], Dr Cherly Beach, pt asked to sit up and talk to [facility] pt reports "no I don't want to do that, close the door"

## 2019-07-11 NOTE — ED Provider Notes (Signed)
TTS consulted with the pts nurse to reevaluate pts appropriateness for psychiatric consult. It was explained that the Pt has been recommended to consult with the Social work department by the [facility].  Pts nurse has shared that he would prefer that TTS speak with the pt. It has been explained that if the pt is in need of a social work consult it is unlikely that TTS will be able to adequately support the pt.   This Probation officer has spoke with the pt who presets with a history of Schizoaffective Disorder. The pt is alert and oriented. Her thoughts are somewhat disorganized but after review of the pts EMR this is likely her baseline. Pt. denies any suicidal plan or intent. Pt. denies the presence of any auditory or visual hallucinations at this time. Although the pts responses at times are inconsistent she shares that her primary concern is homelessness. Pt states " I am pregnant and I don't want to be on the roads." Pt makes references to the COVID-19 pandemic and states that she is nervous being around others.  Patient denies any other medical complaints.    TTS has consulted with pt Physician Dr. Beather Arbour who has agreed to discontinue the TTS consult and order a social work consult for the pt.

## 2019-07-11 NOTE — ED Notes (Signed)
Pt given breakfast tray

## 2019-07-11 NOTE — ED Notes (Signed)
ED BHU [place] Is the patient under IVC or is there intent for IVC:  voluntary   Is the patient medically cleared: Yes.   Is there vacancy in the ED BHU: Yes.   Is the population mix appropriate for patient: Yes.   Is the patient awaiting placement in inpatient or outpatient setting:   She is awaiting her social work consult  Has the patient had a psychiatric consult:  Survey of unit performed for contraband, proper placement and condition of furniture, tampering with fixtures in bathroom, shower, and each patient room: Yes.  ; Findings:  APPEARANCE/BEHAVIOR Calm and cooperative NEURO ASSESSMENT Orientation: oriented x3  Denies pain Hallucinations: No.None noted (Hallucinations)  Denies  Speech: Normal Gait: normal RESPIRATORY ASSESSMENT Even  Unlabored respirations  CARDIOVASCULAR ASSESSMENT Pulses equal   regular rate  Skin warm and dry   GASTROINTESTINAL ASSESSMENT no GI complaint EXTREMITIES Full ROM  PLAN OF CARE Provide calm/safe environment. Vital signs assessed twice daily. ED BHU Assessment once each 12-hour shift.  Assure the ED provider has rounded once each shift. Provide and encourage hygiene. Provide redirection as needed. Assess for escalating behavior; address immediately and inform ED provider.  Assess family dynamic and appropriateness for visitation as needed: Yes.  ; If necessary, describe findings:  Educate the patient/family about BHU procedures/visitation: Yes.  ; If necessary, describe findings:

## 2019-07-11 NOTE — ED Notes (Signed)
Pt given snack. 

## 2019-07-11 NOTE — ED Notes (Signed)
When this writer opened the door during hourly rounds, pt said, "Yes?" This Probation officer said she was just checking on the pt and didn't know if pt would be sleeping. Pt said, "No, I'm just masturbating," in a matter-of-fact tone. Pt given privacy.

## 2019-07-11 NOTE — TOC Progression Note (Addendum)
Transition of Care [facility]) - Progression Note    Patient Details  Name: [name] Shayan Appelbaum MRN: [id number] Date of Birth: 12/17/1991  Transition of Care [facility]) CM/SW Contact  Tania Miller [place], LCSW Phone Number: 07/11/2019, 2:46 PM  Clinical Narrative:     CSW attempted to contact the [facility] per patient's request. Voicemail left.   3:20pm - RNCM emailed [facility] in [place] to see about bed availability. Phone number currently disconnected.  CSW received return phone call from [facility], they currently have no space. Patient put on waiting list.   Expected Discharge Plan: Homeless Shelter Barriers to Discharge: Homeless with medical needs(patient is 5 months pregnant)  Expected Discharge Plan and Services Expected Discharge Plan: Homeless Shelter   Discharge Planning Services: CM Consult                                           Social Determinants of Health (SDOH) Interventions    Readmission Risk Interventions No flowsheet data found.

## 2019-07-11 NOTE — TOC Initial Note (Signed)
Transition of Care [facility](TOC) - Initial/Assessment Note    Patient Details  Name: [name] Shayan Kilman MRN: [id number] Date of Birth: 05/27/1991  Transition of Care [facility](TOC) CM/SW Contact:    Tania Jacoya Bauman, LCSW Phone Number: 07/11/2019, 2:18 PM  Clinical Narrative:      Patient is a 28 year old female that presents to the ED because she was hot outside. Patient is currently homeless, and stayed at the [facility] last night. Patient states that she only has about $17 left. Patient states that she receives $500 a month, and also receives food stamps. Patient does have an 28 year old daughter, who currently lives with her mother. Patient shared that she does not want to live with her mother because she "hates her."  Patient reports that she does not want to go to a homeless shelter because "there are too many people there." Patient may possibly be open to go to a women's only shelter.  Patient is receiving [facility], and has an appointment on 7/17 at 2pm. Patient is aware of this appointment.         Patient asked CSW to call "[facility]" to see where they are with getting her housing.         Expected Discharge Plan: Homeless Shelter Barriers to Discharge: Homeless with medical needs(patient is 5 months pregnant)   Patient Goals and CMS Choice        Expected Discharge Plan and Services Expected Discharge Plan: Homeless Shelter   Discharge Planning Services: CM Consult                                          Prior Living Arrangements/Services   Lives with:: Self Patient language and need for interpreter reviewed:: Yes Do you feel safe going back to the place where you live?: No   patient does not have a home  Need for Family Participation in Patient Care: No (Comment) Care giver support system in place?: No (comment)   Criminal Activity/Legal Involvement Pertinent to Current Situation/Hospitalization: No - Comment as needed  Activities of  Daily Living      Permission Sought/Granted   Permission granted to share information with : Yes, Verbal Permission Granted     Permission granted to share info w AGENCY: [facility]        Emotional Assessment   Attitude/Demeanor/Rapport: Engaged   Orientation: : Oriented to Place, Oriented to Self, Oriented to  Time, Oriented to Situation Alcohol / Substance Use: Illicit Drugs(patient states she has not used any drugs for the last 5 months, since she has been pregnant) Psych Involvement: (patient has been involved with psych in the past)  Admission diagnosis:  EMS Patient Active Problem List   Diagnosis Date Noted  . Decreased fetal movement 07/02/2019  . Club foot, fetal, affecting care of mother, antepartum, single gestation 06/29/2019  . Supervision of high risk pregnancy, antepartum 06/19/2019  . Cocaine abuse with cocaine-induced mood disorder (HCC) 04/15/2019  . 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/03/2018  . Bacterial vaginosis 12/25/2017  . Schizoaffective disorder, bipolar type (HCC) 09/02/2017  . PTSD (post-traumatic stress disorder) 09/02/2017  . Bipolar disorder, curr episode mixed, severe, with psychotic features (HCC) 09/02/2017  . MRSA carrier 07/18/2017  . Bipolar I disorder, most recent episode mixed, severe with psychotic features (HCC) 07/18/2017  . Overdose 07/16/2017  . Aspiration pneumonia (HCC) 07/16/2017  .  Acute respiratory failure ([place]) 07/16/2017  . Polysubstance abuse ([place]) 07/16/2017  . Eczema 05/08/2017  . Borderline personality disorder ([place]) 11/06/2016  . Cocaine use disorder, severe, dependence ([place]) 11/06/2016  . Cannabis use disorder, moderate, dependence ([place]) 11/06/2016  . Alcohol use disorder, mild, abuse 11/06/2016  . Asthma 09/23/2011  . Tobacco use disorder 09/15/2009   PCP:  Center, [place]:   [place], [place] - [address] [facility] [address] [place] [address] Phone: [phone] Fax: [phone]     Social Determinants of Health (SDOH) Interventions    Readmission Risk Interventions No flowsheet data found.

## 2019-07-11 NOTE — ED Notes (Signed)
Pt given blanket.

## 2019-07-11 NOTE — ED Provider Notes (Signed)
-----------------------------------------   1:52 AM on 07/11/2019 -----------------------------------------  Patient was evaluated by [facility] psychiatrist Dr. Cherly Beach who has rescinded her IVC and recommends discharge home with support.  He does also recommend social work consultation for placement.  This has been placed in the computer.   Paulette Blanch, MD 07/11/19 [phone]

## 2019-07-11 NOTE — ED Notes (Signed)
BEHAVIORAL HEALTH ROUNDING Patient sleeping: Yes.   Patient alert and oriented: eyes closed  Appears asleep Behavior appropriate: Yes.  ; If no, describe:  Nutrition and fluids offered: Yes  Toileting and hygiene offered: sleeping Sitter present: q 15 minute observations and security camera monitoring Law enforcement present: yes  ODS 

## 2019-07-11 NOTE — ED Notes (Signed)
Pt transferred into ED BHU room 8    Patient assigned to appropriate care area. Patient oriented to unit/care area: Informed that, for her safety, care areas are designed for safety and monitored by security cameras at all times;  phone times explained to patient. Patient verbalizes understanding, and verbal contract for safety obtained.    She is awaiting social work consult to assist her with housing  She is 5 months pregnant     Pt arrives to unit with black cotton gloves to bilateral hands and an earring in her left ear
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
## 2019-07-11 NOTE — ED Notes (Signed)
TTS tele in room with pt

## 2019-07-12 ENCOUNTER — Encounter: Payer: Medicaid Other | Admitting: Maternal Newborn

## 2019-07-12 NOTE — TOC Transition Note (Signed)
Transition of Care [facility]) - CM/SW Discharge Note   Patient Details  Name: Bridget Carpenter MRN: [id number] Date of Birth: [birth year]  Transition of Care [facility]) CM/SW Contact:  Tania Kloee Ballew, LCSW Phone Number: 07/12/2019, 11:05 AM   Clinical Narrative:    Patient stated that she no longer wanted to wait in the ED for placement. Patient reported that she had to contact [employer]SI tomorrow (7/15) so that she can continue to receive her benefits. Patient was provided with a list of shelters, resources, and bus passes. Patient was informed that she was on the waiting list for a [facility] shelter, and was provided with number in case she wanted to reach out to them regarding updates. Patient is aware of perinatal appointment at [facility] on Friday. Patient thanked CSW for her assistance.    Final next level of care: Other (comment)(patient provided with list of resources and homeless shelters, and bus passes) Barriers to Discharge: Homeless with medical needs(patient is 5 months pregnant)   Patient Goals and CMS Choice        Discharge Placement                       Discharge Plan and Services   Discharge Planning Services: CM Consult                                 Social Determinants of Health (SDOH) Interventions     Readmission Risk Interventions No flowsheet data found.

## 2019-07-12 NOTE — ED Provider Notes (Signed)
-----------------------------------------   6:05 AM on 07/12/2019 -----------------------------------------   Blood pressure (!) 102/59, pulse 71, temperature 98.1 F (36.7 C), temperature source Oral, resp. rate 16, SpO2 100 %.  The patient is calm and cooperative at this time.  There have been no acute events since the last update.  Awaiting disposition plan from Social Work team(s).   Paulette Blanch, MD 07/12/19 [phone]

## 2019-07-12 NOTE — ED Notes (Addendum)
She is currently taking a shower   [place] ROUNDING Patient sleeping: No. Patient alert and oriented: yes Behavior appropriate: Yes.  ; If no, describe:  Nutrition and fluids offered: yes Toileting and hygiene offered: Yes  Sitter present: q15 minute observations and security camera monitoring Law enforcement present: Yes  ODS

## 2019-07-12 NOTE — ED Notes (Signed)
Assessment completed  - she denies pain  She is requesting to discharge from the ED  Pt stating "I am here for a social work consult and I don't want to be here any more - the soap you all gave me for my shower is not enough - the food I get is not enough for my baby - I am ready to go"  I reassured her

## 2019-07-12 NOTE — ED Notes (Signed)
Breakfast had been provided to her room - pt paranoid that someone messed with her food - reassurance provided along with a [place] sandwich tray and 2 milks   Pt relaxed and ate the breakfast tray also

## 2019-07-12 NOTE — ED Notes (Signed)
ED BHU [place] Is the patient under IVC or is there intent for IVC:  Voluntary  Is the patient medically cleared: Yes.   Is there vacancy in the ED BHU: Yes.   Is the population mix appropriate for patient: Yes.   Is the patient awaiting placement in inpatient or outpatient setting: Yes.  Social work consult is in progress Has the patient had a psychiatric consult: Yes.   Survey of unit performed for contraband, proper placement and condition of furniture, tampering with fixtures in bathroom, shower, and each patient room: Yes.  ; Findings:  APPEARANCE/BEHAVIOR cooperative NEURO ASSESSMENT Orientation: oriented x3  Denies pain Hallucinations: No.None noted (Hallucinations)  Denies  Speech: Normal Gait: normal RESPIRATORY ASSESSMENT Even  Unlabored respirations  CARDIOVASCULAR ASSESSMENT Pulses equal   regular rate  Skin warm and dry   GASTROINTESTINAL ASSESSMENT no GI complaint EXTREMITIES Full ROM  PLAN OF CARE Provide calm/safe environment. Vital signs assessed twice daily. ED BHU Assessment once each 12-hour shift.  Assure the ED provider has rounded once each shift. Provide and encourage hygiene. Provide redirection as needed. Assess for escalating behavior; address immediately and inform ED provider.  Assess family dynamic and appropriateness for visitation as needed: Yes.  ; If necessary, describe findings:  Educate the patient/family about BHU procedures/visitation: Yes.  ; If necessary, describe findings:

## 2019-07-12 NOTE — ED Notes (Signed)

## 2019-07-12 NOTE — ED Notes (Signed)
Patient observed lying in bed with eyes closed  Even, unlabored respirations observed   NAD pt appears to be sleeping  I will continue to monitor along with every 15 minute visual observations and ongoing security camera monitoring    

## 2019-07-12 NOTE — ED Notes (Signed)
BEHAVIORAL HEALTH ROUNDING Patient sleeping: No. Patient alert and oriented: yes Behavior appropriate: Yes.  ; If no, describe:  Nutrition and fluids offered: yes Toileting and hygiene offered: Yes  Sitter present: q15 minute observations and security camera monitoring Law enforcement present: Yes  ODS  

## 2019-07-12 NOTE — ED Notes (Signed)
Pt is resting again after spending some time awake, loudly watching the TV and seeking staff assistance with a few matters. Pt says she is very fearful of COVID -- when she saw that a previous pt had not flushed the toilet, she requested that staff sanitize the bathroom. "People who don't flush the toilet usually don't wash their hands." This writer did wipe down surfaces with a purple wipe prior to pt's use. She watched TV for awhile before turning it and the lights off again. She remains pleasant but paranoid.

## 2019-07-12 NOTE — Discharge Instructions (Signed)
Return to the ER for any other concerns. 

## 2019-07-15 ENCOUNTER — Ambulatory Visit (INDEPENDENT_AMBULATORY_CARE_PROVIDER_SITE_OTHER): Payer: Medicaid Other | Admitting: Certified Nurse Midwife

## 2019-07-15 ENCOUNTER — Telehealth: Payer: Self-pay

## 2019-07-15 ENCOUNTER — Other Ambulatory Visit: Payer: Self-pay

## 2019-07-15 VITALS — BP 100/60 | Wt 141.0 lb

## 2019-07-15 DIAGNOSIS — O358XX0 Maternal care for other (suspected) fetal abnormality and damage, not applicable or unspecified: Secondary | ICD-10-CM

## 2019-07-15 DIAGNOSIS — O99342 Other mental disorders complicating pregnancy, second trimester: Secondary | ICD-10-CM

## 2019-07-15 DIAGNOSIS — O0990 Supervision of high risk pregnancy, unspecified, unspecified trimester: Secondary | ICD-10-CM

## 2019-07-15 DIAGNOSIS — Z3A24 24 weeks gestation of pregnancy: Secondary | ICD-10-CM

## 2019-07-15 DIAGNOSIS — F250 Schizoaffective disorder, bipolar type: Secondary | ICD-10-CM

## 2019-07-15 DIAGNOSIS — O35HXX0 Maternal care for other (suspected) fetal abnormality and damage, fetal lower extremities anomalies, not applicable or unspecified: Secondary | ICD-10-CM

## 2019-07-15 DIAGNOSIS — F1910 Other psychoactive substance abuse, uncomplicated: Secondary | ICD-10-CM

## 2019-07-15 NOTE — Telephone Encounter (Signed)
Pt calling; also called after hour nurse last night; spotting yesterday; also today gums bled when she brushed her teeth.  Was adv by SDJ if bleeding became worse in the next hour or two from the call to come in to L&D.  Pt has appt sched for today.  Her mom is picking her up and bringing her.  Pt wanted to know if baby was okay.  Adv to keep appt, will hear heartbeat at appt.  Pt wants to see heart beating by u/s.  Adv if CLG thinks she needs an u/s she will arrange for.  I cannot order u/s.  Pt very appreciative.

## 2019-07-18 ENCOUNTER — Telehealth: Payer: Self-pay

## 2019-07-18 ENCOUNTER — Other Ambulatory Visit: Payer: Self-pay

## 2019-07-18 ENCOUNTER — Emergency Department
Admission: EM | Admit: 2019-07-18 | Discharge: 2019-07-18 | Disposition: A | Discharge status: Home / Self Care | Payer: Medicaid Other | Attending: Emergency Medicine | Admitting: Emergency Medicine

## 2019-07-18 ENCOUNTER — Encounter: Payer: Self-pay | Admitting: Emergency Medicine

## 2019-07-18 DIAGNOSIS — L309 Dermatitis, unspecified: Secondary | ICD-10-CM | POA: Diagnosis not present

## 2019-07-18 DIAGNOSIS — F17210 Nicotine dependence, cigarettes, uncomplicated: Secondary | ICD-10-CM | POA: Diagnosis not present

## 2019-07-18 DIAGNOSIS — Z79899 Other long term (current) drug therapy: Secondary | ICD-10-CM | POA: Insufficient documentation

## 2019-07-18 DIAGNOSIS — F1910 Other psychoactive substance abuse, uncomplicated: Secondary | ICD-10-CM | POA: Diagnosis not present

## 2019-07-18 DIAGNOSIS — F259 Schizoaffective disorder, unspecified: Secondary | ICD-10-CM | POA: Insufficient documentation

## 2019-07-18 DIAGNOSIS — R21 Rash and other nonspecific skin eruption: Secondary | ICD-10-CM | POA: Diagnosis present

## 2019-07-18 DIAGNOSIS — N898 Other specified noninflammatory disorders of vagina: Secondary | ICD-10-CM | POA: Insufficient documentation

## 2019-07-18 LAB — COMPREHENSIVE METABOLIC PANEL
ALT: 12 U/L (ref 0–44)
AST: 22 U/L (ref 15–41)
Albumin: 3.5 g/dL (ref 3.5–5.0)
Alkaline Phosphatase: 43 U/L (ref 38–126)
Anion gap: 9 (ref 5–15)
BUN: 10 mg/dL (ref 6–20)
CO2: 22 mmol/L (ref 22–32)
Calcium: 8.8 mg/dL — ABNORMAL LOW (ref 8.9–10.3)
Chloride: 107 mmol/L (ref 98–111)
Creatinine, Ser: 0.65 mg/dL (ref 0.44–1.00)
GFR calc Af Amer: >60 mL/min (ref >60)
GFR calc non Af Amer: >60 mL/min (ref >60)
Glucose, Bld: 125 mg/dL — ABNORMAL HIGH (ref 70–99)
Potassium: 3.3 mmol/L — ABNORMAL LOW (ref 3.5–5.1)
Sodium: 138 mmol/L (ref 135–145)
Total Bilirubin: 1.1 mg/dL (ref 0.3–1.2)
Total Protein: 7.1 g/dL (ref 6.5–8.1)

## 2019-07-18 LAB — CHLAMYDIA/NGC RT PCR (ARMC ONLY)
Chlamydia Tr: NOT DETECTED
N gonorrhoeae: NOT DETECTED

## 2019-07-18 LAB — CBC WITH DIFFERENTIAL/PLATELET
Abs Immature Granulocytes: 0.04 10*3/uL (ref 0.00–0.07)
Basophils Absolute: 0 10*3/uL (ref 0.0–0.1)
Basophils Relative: 0 %
Eosinophils Absolute: 0.5 10*3/uL (ref 0.0–0.5)
Eosinophils Relative: 5 %
HCT: 36.7 % (ref 36.0–46.0)
Hemoglobin: 12.2 g/dL (ref 12.0–15.0)
Immature Granulocytes: 1 %
Lymphocytes Relative: 24 %
Lymphs Abs: 2 10*3/uL (ref 0.7–4.0)
MCH: 31.7 pg (ref 26.0–34.0)
MCHC: 33.2 g/dL (ref 30.0–36.0)
MCV: 95.3 fL (ref 80.0–100.0)
Monocytes Absolute: 0.7 10*3/uL (ref 0.1–1.0)
Monocytes Relative: 9 %
Neutro Abs: 5.1 10*3/uL (ref 1.7–7.7)
Neutrophils Relative %: 61 %
Platelets: 229 10*3/uL (ref 150–400)
RBC: 3.85 MIL/uL — ABNORMAL LOW (ref 3.87–5.11)
RDW: 12.1 % (ref 11.5–15.5)
WBC: 8.4 10*3/uL (ref 4.0–10.5)
nRBC: 0 % (ref 0.0–0.2)

## 2019-07-18 LAB — URINALYSIS, COMPLETE (UACMP) WITH MICROSCOPIC
Bilirubin Urine: NEGATIVE
Glucose, UA: NEGATIVE mg/dL
Hgb urine dipstick: NEGATIVE
Ketones, ur: NEGATIVE mg/dL
Leukocytes,Ua: NEGATIVE
Nitrite: NEGATIVE
Protein, ur: 30 mg/dL — AB
Specific Gravity, Urine: 1.029 (ref 1.005–1.030)
pH: 6 (ref 5.0–8.0)

## 2019-07-18 LAB — SALICYLATE LEVEL: Salicylate Lvl: <7 mg/dL (ref 2.8–30.0)

## 2019-07-18 LAB — WET PREP, GENITAL
Clue Cells Wet Prep HPF POC: NONE SEEN
Sperm: NONE SEEN
Trich, Wet Prep: NONE SEEN
Yeast Wet Prep HPF POC: NONE SEEN

## 2019-07-18 LAB — URINE DRUG SCREEN, QUALITATIVE (ARMC ONLY)
Amphetamines, Ur Screen: NOT DETECTED
Barbiturates, Ur Screen: NOT DETECTED
Benzodiazepine, Ur Scrn: NOT DETECTED
Cannabinoid 50 Ng, Ur ~~LOC~~: NOT DETECTED
Cocaine Metabolite,Ur ~~LOC~~: NOT DETECTED
MDMA (Ecstasy)Ur Screen: NOT DETECTED
Methadone Scn, Ur: NOT DETECTED
Opiate, Ur Screen: NOT DETECTED
Phencyclidine (PCP) Ur S: NOT DETECTED
Tricyclic, Ur Screen: NOT DETECTED

## 2019-07-18 LAB — ACETAMINOPHEN LEVEL: Acetaminophen (Tylenol), Serum: <10 ug/mL — ABNORMAL LOW (ref 10–30)

## 2019-07-18 LAB — ETHANOL: Alcohol, Ethyl (B): <10 mg/dL (ref <10)

## 2019-07-18 MED ORDER — METRONIDAZOLE 500 MG PO TABS: 500.0000 mg | ORAL_TABLET | Freq: Two times a day (BID) | ORAL | 0 refills | Status: AC

## 2019-07-18 MED ORDER — CEPHALEXIN 500 MG PO CAPS: 500.0000 mg | ORAL_CAPSULE | Freq: Three times a day (TID) | ORAL | 0 refills | Status: DC

## 2019-07-18 MED ORDER — CEPHALEXIN 500 MG PO CAPS: 500.0000 mg | ORAL_CAPSULE | Freq: Three times a day (TID) | ORAL | 0 refills | Status: AC

## 2019-07-18 MED ORDER — CEPHALEXIN 500 MG PO CAPS
500.0000 mg | ORAL_CAPSULE | Freq: Once | ORAL | Status: AC
  Administered 2019-07-18: 23:00:00 500 mg via ORAL
  Filled 2019-07-18: qty 1

## 2019-07-18 MED ORDER — METRONIDAZOLE 500 MG PO TABS
500.0000 mg | ORAL_TABLET | Freq: Once | ORAL | Status: AC
  Administered 2019-07-18: 500 mg via ORAL
  Filled 2019-07-18: qty 1

## 2019-07-18 MED ORDER — TRIAMCINOLONE ACETONIDE 0.1 % EX CREA: 1.0000 "application " | TOPICAL_CREAM | Freq: Four times a day (QID) | CUTANEOUS | 3 refills | Status: DC
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
## 2019-07-18 NOTE — ED Notes (Signed)
Pt assessed by MD Jari Pigg. Per MD, pt does not meet IVC criteria at this time. Pt does not want to be seen for psychiatric complaints and refused to dress out. Will continue to monitor.
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
## 2019-07-18 NOTE — Telephone Encounter (Signed)
Pt called triage line needing a appointment, but did not want to tell me why she needed the appointment. Finally Pt stated it was life or death situation, I advised pt to go to the ER if this was Life or Death situation,Pt stated she did not trust the ER, Pt stated she was not bleeding, I asked if in any pain she stated she was Not, but could be, She also reported that her body was doing string things. I advised her again to go to the ER, she stated that she did not have a  ride, I told her if this was a life or death situation to call 911. Then pt said she needed to be seen for leaking fluids, I told her we had no openings and she could get this checked at the ER. Then pt agreed to go to er

## 2019-07-18 NOTE — ED Notes (Signed)
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
## 2019-07-20 ENCOUNTER — Other Ambulatory Visit: Payer: Self-pay | Admitting: Certified Nurse Midwife

## 2019-07-20 DIAGNOSIS — F1910 Other psychoactive substance abuse, uncomplicated: Secondary | ICD-10-CM

## 2019-07-20 DIAGNOSIS — O35HXX0 Maternal care for other (suspected) fetal abnormality and damage, fetal lower extremities anomalies, not applicable or unspecified: Secondary | ICD-10-CM

## 2019-07-20 DIAGNOSIS — F250 Schizoaffective disorder, bipolar type: Secondary | ICD-10-CM

## 2019-07-20 DIAGNOSIS — O0990 Supervision of high risk pregnancy, unspecified, unspecified trimester: Secondary | ICD-10-CM

## 2019-07-20 DIAGNOSIS — O358XX0 Maternal care for other (suspected) fetal abnormality and damage, not applicable or unspecified: Secondary | ICD-10-CM

## 2019-07-21 ENCOUNTER — Telehealth: Payer: Self-pay

## 2019-07-21 NOTE — Telephone Encounter (Signed)
Called and spoke with patient. She was seen in ER last week and prescribed Keflex for a rash and Flagyl for BV. I saw her 3 days prior and she had a negative wet prep. Advised to discontinue Flagyl. She is afraid that someone has poisoned her Keflex. Wants me to prescribe 4 more days of Keflex, but does not want me to call in th3e RX. Wants to be given the prescription when she comes for her appointment 31 July. I also told her of her [facility] appointment 3 August.

## 2019-07-21 NOTE — Telephone Encounter (Signed)
Pt calling; was rx'd something she thinks she shouldn't have taken from MD in ED.  [phone]  Pt thinks Keflex 500mg  tid, flagyl 500 bid and kenolog 0.1% cream qid is too much. Wants to know if it will harm her unborn baby.  States she doesn't know and is trusting physicians but she has it documented that she contacted [place] about it.  Adv I will double ck for her and will call her back.

## 2019-07-25 ENCOUNTER — Emergency Department
Admission: EM | Admit: 2019-07-25 | Discharge: 2019-07-25 | Disposition: A | Discharge status: Home / Self Care | Payer: Medicaid Other | Source: Home / Self Care | Attending: Emergency Medicine | Admitting: Emergency Medicine

## 2019-07-25 ENCOUNTER — Encounter: Payer: Self-pay | Admitting: Emergency Medicine

## 2019-07-25 ENCOUNTER — Emergency Department: Payer: Medicaid Other

## 2019-07-25 ENCOUNTER — Other Ambulatory Visit: Payer: Self-pay

## 2019-07-25 ENCOUNTER — Observation Stay
Admission: EM | Admit: 2019-07-25 | Discharge: 2019-07-26 | Disposition: A | Discharge status: Home / Self Care | Payer: Medicaid Other | Attending: Obstetrics and Gynecology | Admitting: Obstetrics and Gynecology

## 2019-07-25 DIAGNOSIS — O99332 Smoking (tobacco) complicating pregnancy, second trimester: Secondary | ICD-10-CM | POA: Insufficient documentation

## 2019-07-25 DIAGNOSIS — O99342 Other mental disorders complicating pregnancy, second trimester: Secondary | ICD-10-CM | POA: Diagnosis not present

## 2019-07-25 DIAGNOSIS — Z5900 Homelessness unspecified: Secondary | ICD-10-CM

## 2019-07-25 DIAGNOSIS — O368120 Decreased fetal movements, second trimester, not applicable or unspecified: Secondary | ICD-10-CM | POA: Diagnosis present

## 2019-07-25 DIAGNOSIS — Z3A26 26 weeks gestation of pregnancy: Secondary | ICD-10-CM | POA: Diagnosis not present

## 2019-07-25 DIAGNOSIS — O99512 Diseases of the respiratory system complicating pregnancy, second trimester: Secondary | ICD-10-CM | POA: Insufficient documentation

## 2019-07-25 DIAGNOSIS — J45909 Unspecified asthma, uncomplicated: Secondary | ICD-10-CM | POA: Insufficient documentation

## 2019-07-25 DIAGNOSIS — F17210 Nicotine dependence, cigarettes, uncomplicated: Secondary | ICD-10-CM | POA: Insufficient documentation

## 2019-07-25 DIAGNOSIS — Z3490 Encounter for supervision of normal pregnancy, unspecified, unspecified trimester: Secondary | ICD-10-CM

## 2019-07-25 DIAGNOSIS — F250 Schizoaffective disorder, bipolar type: Secondary | ICD-10-CM | POA: Insufficient documentation

## 2019-07-25 DIAGNOSIS — Z3A25 25 weeks gestation of pregnancy: Secondary | ICD-10-CM | POA: Insufficient documentation

## 2019-07-25 DIAGNOSIS — R45851 Suicidal ideations: Secondary | ICD-10-CM

## 2019-07-25 DIAGNOSIS — F603 Borderline personality disorder: Secondary | ICD-10-CM | POA: Diagnosis present

## 2019-07-25 DIAGNOSIS — F332 Major depressive disorder, recurrent severe without psychotic features: Secondary | ICD-10-CM | POA: Diagnosis present

## 2019-07-25 DIAGNOSIS — Z3482 Encounter for supervision of other normal pregnancy, second trimester: Secondary | ICD-10-CM | POA: Insufficient documentation

## 2019-07-25 DIAGNOSIS — O26899 Other specified pregnancy related conditions, unspecified trimester: Secondary | ICD-10-CM

## 2019-07-25 LAB — COMPREHENSIVE METABOLIC PANEL
ALT: 10 U/L (ref 0–44)
AST: 21 U/L (ref 15–41)
Albumin: 3.1 g/dL — ABNORMAL LOW (ref 3.5–5.0)
Alkaline Phosphatase: 39 U/L (ref 38–126)
Anion gap: 9 (ref 5–15)
BUN: 12 mg/dL (ref 6–20)
CO2: 21 mmol/L — ABNORMAL LOW (ref 22–32)
Calcium: 8.2 mg/dL — ABNORMAL LOW (ref 8.9–10.3)
Chloride: 110 mmol/L (ref 98–111)
Creatinine, Ser: 0.58 mg/dL (ref 0.44–1.00)
GFR calc Af Amer: >60 mL/min (ref >60)
GFR calc non Af Amer: >60 mL/min (ref >60)
Glucose, Bld: 91 mg/dL (ref 70–99)
Potassium: 3.7 mmol/L (ref 3.5–5.1)
Sodium: 140 mmol/L (ref 135–145)
Total Bilirubin: 0.7 mg/dL (ref 0.3–1.2)
Total Protein: 6.4 g/dL — ABNORMAL LOW (ref 6.5–8.1)

## 2019-07-25 LAB — CBC WITH DIFFERENTIAL/PLATELET
Abs Immature Granulocytes: 0.04 10*3/uL (ref 0.00–0.07)
Basophils Absolute: 0 10*3/uL (ref 0.0–0.1)
Basophils Relative: 0 %
Eosinophils Absolute: 0.2 10*3/uL (ref 0.0–0.5)
Eosinophils Relative: 3 %
HCT: 32.6 % — ABNORMAL LOW (ref 36.0–46.0)
Hemoglobin: 10.9 g/dL — ABNORMAL LOW (ref 12.0–15.0)
Immature Granulocytes: 1 %
Lymphocytes Relative: 23 %
Lymphs Abs: 1.7 10*3/uL (ref 0.7–4.0)
MCH: 32 pg (ref 26.0–34.0)
MCHC: 33.4 g/dL (ref 30.0–36.0)
MCV: 95.6 fL (ref 80.0–100.0)
Monocytes Absolute: 0.7 10*3/uL (ref 0.1–1.0)
Monocytes Relative: 10 %
Neutro Abs: 4.7 10*3/uL (ref 1.7–7.7)
Neutrophils Relative %: 63 %
Platelets: 198 10*3/uL (ref 150–400)
RBC: 3.41 MIL/uL — ABNORMAL LOW (ref 3.87–5.11)
RDW: 12.1 % (ref 11.5–15.5)
WBC: 7.3 10*3/uL (ref 4.0–10.5)
nRBC: 0.3 % — ABNORMAL HIGH (ref 0.0–0.2)

## 2019-07-25 NOTE — ED Triage Notes (Signed)
Pt states she is 6 months pregnant and would like to get an ultrasound picture and talk to a Education officer, museum. No complaints.

## 2019-07-25 NOTE — ED Notes (Signed)
Pt verbalizes d/c understanding and follow up. Pt informed to go to shelter by Levada Dy, [place] understands. NAD noted, denies furhter concerns

## 2019-07-25 NOTE — NC FL2 (Signed)
[place] MEDICAID FL2 LEVEL OF CARE SCREENING TOOL     IDENTIFICATION  Patient Name: Bridget Carpenter Birthdate: 12/07/1991 Sex: female Admission Date (Current Location): 07/25/2019  [place]  Bridget Carpenter [facility] Facility and Address:  [facility], [address], [place], [place] [address]      Provider Number: [id number]  Attending Physician Name and Address:  Arnaldo NatalMalinda, Paul F, MD  Relative Name and Phone Number:       Current Level of Care: Hospital [facility]commended [facility] Prior Approval Number:    Date Approved/Denied:   PASRR Number:    Discharge Plan: Other (Comment)    Current Diagnoses: Patient Active Problem List   Diagnosis Date Noted  . Decreased fetal movement 07/02/2019  . Club foot, fetal, affecting care of mother, antepartum, single gestation 06/29/2019  . Supervision of high risk pregnancy, antepartum 06/19/2019  . Cocaine abuse with cocaine-induced mood disorder (HCC) 04/15/2019  . 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/03/2018  . Bacterial vaginosis 12/25/2017  . Schizoaffective disorder, bipolar type (HCC) 09/02/2017  . PTSD (post-traumatic stress disorder) 09/02/2017  . Bipolar disorder, curr episode mixed, severe, with psychotic features (HCC) 09/02/2017  . MRSA carrier 07/18/2017  . Bipolar I disorder, most recent episode mixed, severe with psychotic features (HCC) 07/18/2017  . Overdose 07/16/2017  . Aspiration pneumonia (HCC) 07/16/2017  . Acute respiratory failure (HCC) 07/16/2017  . Polysubstance abuse (HCC) 07/16/2017  . Eczema 05/08/2017  . Borderline personality disorder (HCC) 11/06/2016  . Cocaine use disorder, severe, dependence (HCC) 11/06/2016  . Cannabis use disorder, moderate, dependence (HCC) 11/06/2016  . Alcohol use disorder, mild, abuse 11/06/2016  . Asthma 09/23/2011  . Tobacco use disorder 09/15/2009    Orientation RESPIRATION BLADDER Height  & Weight     Self, Time, Situation, Place  Normal Continent Weight: 68 kg Height:  5\' 6"  (167.6 cm)  BEHAVIORAL SYMPTOMS/MOOD NEUROLOGICAL BOWEL NUTRITION STATUS      Continent    AMBULATORY STATUS COMMUNICATION OF NEEDS Skin   Independent Verbally Normal                       Personal Care Assistance Level of Assistance  Bathing, Feeding, Dressing Bathing Assistance: Independent Feeding assistance: Independent Dressing Assistance: Independent     Functional Limitations Info  Sight, Hearing, Speech Sight Info: Adequate Hearing Info: Adequate Speech Info: Adequate    SPECIAL CARE FACTORS FREQUENCY                       Contractures Contractures Info: Not present    Additional Factors Info                  Current Medications (07/25/2019):  This is the current hospital active medication list No current facility-administered medications for this encounter.    Current Outpatient Medications  Medication Sig Dispense Refill  . cephALEXin (KEFLEX) 500 MG capsule Take 1 capsule (500 mg total) by mouth 3 (three) times daily for 7 days. 21 capsule 0  . metroNIDAZOLE (FLAGYL) 500 MG tablet Take 1 tablet (500 mg total) by mouth 2 (two) times daily for 7 days. 14 tablet 0  . permethrin (ELIMITE) 5 % cream Apply from forehead to soles of feet, sparing the area around the eyes and leave on 10 hours, then shower off. Repeat in 1 week. 60 g 1  . Prenat-FeCbn-FeAsp-Meth-FA-DHA (PRENATE MINI) 18-0.6-0.4-350 MG CAPS Take 1 capsule by mouth daily.  30 capsule 11  . triamcinolone cream (KENALOG) 0.1 % Apply 1 application topically 4 (four) times daily. 30 g 3     Discharge Medications: Please see discharge summary for a list of discharge medications.  Relevant Imaging Results:  Relevant Lab Results:   Additional Information SS# [id number]  Bridget Garfinkel, RN

## 2019-07-25 NOTE — OB Triage Note (Signed)
Discharge instructions provided and reviewed with patient.  Follow up care discussed.  Social support and resources explained to patient.  Pt verbalizes understanding.

## 2019-07-25 NOTE — ED Notes (Signed)
Pt attempted to "walk around" and walked towards an exit but was stopped by security.

## 2019-07-25 NOTE — Discharge Instructions (Signed)
Please continue to follow-up with [facility].  If you do not want to stay in the care home as was arranged for you you can try the shelter.  Return here as needed as well.  Get your prenatal vitamins prescription filled and take them.

## 2019-07-25 NOTE — ED Notes (Signed)
Pt refusing to give urine sample at this time. MD aware

## 2019-07-25 NOTE — ED Notes (Signed)
Patient transported to ultrasound.

## 2019-07-25 NOTE — ED Notes (Signed)
Pt provided lunch tray per request.

## 2019-07-25 NOTE — OB Triage Note (Signed)
Pt arrival to triage with c/o decreased fetal movement.  Pt states she felt fetus move today, and is concerned that the baby is ok.  Pt denies vaginal bleeding and LOF.  Pt also denies contractions.  EFM and toco briefly applied.  Pt refusing continuous fetal monitoring.

## 2019-07-25 NOTE — ED Provider Notes (Signed)
[facility] Emergency Department Provider Note   ____________________________________________   First MD Initiated Contact with Patient 07/25/19 1326     (approximate)  I have reviewed the triage vital signs and the nursing notes.   HISTORY  Chief Complaint Mental Health Problem   HPI [name] Karie KirksShayan Babe is a 28 y.o. female patient with a history of borderline personality disorder drug use eczema and asthma who also smokes cigarettes.  She reports she is homeless and 6 months pregnant.  She is wanting to see if we can give her any help with her homelessness and requesting an ultrasound to see how the baby is doing.  She has been unable to afford her prenatal vitamins.         Past Medical History:  Diagnosis Date  . Asthma   . Depression 2009   Inpatient psych admission for SI, dissociative fugue  . Dissociative disorder or reaction 2009  . Eczema   . H/O: suicide attempt   . ODD (oppositional defiant disorder)   . PTSD (post-traumatic stress disorder)   . Schizoaffective disorder (HCC)   . Substance abuse [facility](HCC)     Patient Active Problem List   Diagnosis Date Noted  . Decreased fetal movement 07/02/2019  . Club foot, fetal, affecting care of mother, antepartum, single gestation 06/29/2019  . Supervision of high risk pregnancy, antepartum 06/19/2019  . Cocaine abuse with cocaine-induced mood disorder (HCC) 04/15/2019  . 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/03/2018  . Bacterial vaginosis 12/25/2017  . Schizoaffective disorder, bipolar type (HCC) 09/02/2017  . PTSD (post-traumatic stress disorder) 09/02/2017  . Bipolar disorder, curr episode mixed, severe, with psychotic features (HCC) 09/02/2017  . MRSA carrier 07/18/2017  . Bipolar I disorder, most recent episode mixed, severe with psychotic features (HCC) 07/18/2017  . Overdose 07/16/2017  . Aspiration pneumonia (HCC) 07/16/2017  . Acute respiratory failure (HCC)  07/16/2017  . Polysubstance abuse (HCC) 07/16/2017  . Eczema 05/08/2017  . Borderline personality disorder (HCC) 11/06/2016  . Cocaine use disorder, severe, dependence (HCC) 11/06/2016  . Cannabis use disorder, moderate, dependence (HCC) 11/06/2016  . Alcohol use disorder, mild, abuse 11/06/2016  . Asthma 09/23/2011  . Tobacco use disorder 09/15/2009    Past Surgical History:  Procedure Laterality Date  . ADENOIDECTOMY    . TONSILLECTOMY      Prior to Admission medications   Medication Sig Start Date End Date Taking? Authorizing Provider  cephALEXin (KEFLEX) 500 MG capsule Take 1 capsule (500 mg total) by mouth 3 (three) times daily for 7 days. 07/18/19 07/25/19  Orvil FeilWoods, Jaclyn M, PA-C  metroNIDAZOLE (FLAGYL) 500 MG tablet Take 1 tablet (500 mg total) by mouth 2 (two) times daily for 7 days. 07/18/19 07/25/19  Orvil FeilWoods, Jaclyn M, PA-C  permethrin (ELIMITE) 5 % cream Apply from forehead to soles of feet, sparing the area around the eyes and leave on 10 hours, then shower off. Repeat in 1 week. 06/30/19   Triplett, Cari B, FNP  Prenat-FeCbn-FeAsp-Meth-FA-DHA (PRENATE MINI) 18-0.6-0.4-350 MG CAPS Take 1 capsule by mouth daily. 06/17/19   Farrel ConnersGutierrez, Colleen, CNM  triamcinolone cream (KENALOG) 0.1 % Apply 1 application topically 4 (four) times daily. 07/18/19   Orvil FeilWoods, Jaclyn M, PA-C    Allergies Banana, Grapefruit extract, and Cantaloupe (diagnostic)  Family History  Adopted: Yes  Problem Relation Age of Onset  . Mental illness Mother   . Mental illness Father   . Heart disease Maternal Grandmother   . Sickle cell trait Adoptive Mother   .  Cleft palate Cousin        maternal side    Social History Social History   Tobacco Use  . Smoking status: Current Every Day Smoker    Packs/day: 0.75    Years: 2.00    Pack years: 1.50    Types: Cigarettes  . Smokeless tobacco: Never Used  Substance Use Topics  . Alcohol use: Not Currently    Alcohol/week: 1.0 standard drinks    Types: 1 Cans  of beer per week    Comment: weekly  . Drug use: Yes    Types: Marijuana, "Crack" cocaine, Heroin    Comment: 4 grams daily    Review of Systems  Constitutional: No fever/chills Eyes: No visual changes. ENT: No sore throat. Cardiovascular: Denies chest pain. Respiratory: Denies shortness of breath. Gastrointestinal: No abdominal pain.  No nausea, no vomiting.  No diarrhea.  No constipation. Genitourinary: Negative for dysuria. Musculoskeletal: Negative for back pain. Skin: Negative for rash. Neurological: Negative for headaches, focal weakness  ____________________________________________   PHYSICAL EXAM:  VITAL SIGNS: ED Triage Vitals [07/25/19 1323]  Enc Vitals Group     BP (!) 109/58     Pulse Rate 75     Resp 20     Temp 99.2 F (37.3 C)     Temp Source Oral     SpO2 100 %     Weight 150 lb (68 kg)     Height 5\' 6"  (1.676 m)     Head Circumference      Peak Flow      Pain Score 0     Pain Loc      Pain Edu?      Excl. in GC?    Constitutional: Alert and oriented. Well appearing and in no acute distress. Eyes: Conjunctivae are normal. Head: Atraumatic. Nose: No congestion/rhinnorhea. Mouth/Throat: Mucous membranes are moist.  Oropharynx non-erythematous. Neck: No stridor. Cardiovascular: Normal rate, regular rhythm. Grossly normal heart sounds.  Good peripheral circulation. Respiratory: Normal respiratory effort.  No retractions. Lungs CTAB. Gastrointestinal: Soft and nontender. No distention except for her 6 months pregnancy. No abdominal bruits. No CVA tenderness. Musculoskeletal: No lower extremity tenderness nor edema.   Neurologic:  Normal speech and language. No gross focal neurologic deficits are appreciated. Skin:  Skin is warm, dry and intact.    ____________________________________________   LABS (all labs ordered are listed, but only abnormal results are displayed)  Labs Reviewed  COMPREHENSIVE METABOLIC PANEL - Abnormal; Notable for the  following components:      Result Value   CO2 21 (*)    Calcium 8.2 (*)    Total Protein 6.4 (*)    Albumin 3.1 (*)    All other components within normal limits  CBC WITH DIFFERENTIAL/PLATELET - Abnormal; Notable for the following components:   RBC 3.41 (*)    Hemoglobin 10.9 (*)    HCT 32.6 (*)    nRBC 0.3 (*)    All other components within normal limits  URINALYSIS, COMPLETE (UACMP) WITH MICROSCOPIC   ____________________________________________  EKG   ____________________________________________  RADIOLOGY  ED MD interpretation:    Official radiology report(s): No results found.  ____________________________________________   PROCEDURES  Procedure(s) performed (including Critical Care):  Procedures   ____________________________________________   INITIAL IMPRESSION / ASSESSMENT AND PLAN / ED COURSE  Social work was able to place the patient in a family care home but patient refuses..  Patient does not want to give up her check to the care home.  She says she does not trust them.  Explained that the care home would give her a room and food as well as her medicine but she does not want to do that.  She is not homicidal or suicidal.  She has been caring for herself and going to her appointments as near as I can tell.             ____________________________________________   FINAL CLINICAL IMPRESSION(S) / ED DIAGNOSES  Final diagnoses:  Homelessness  Pregnancy, unspecified gestational age     ED Discharge Orders    None       Note:  This document was prepared using Dragon voice recognition software and may include unintentional dictation errors.    Nena Polio, MD 07/25/19 1525

## 2019-07-25 NOTE — Care Management (Addendum)
07/12/19 note- . Patient was provided with a list of shelters, resources, and bus passes. Patient was informed that she was on the waiting list for a [facility] shelter, and was provided with number in case she wanted to reach out to them regarding updates. Patient is aware of perinatal appointment at [facility] on Friday. Patient thanked CSW for her assistance. on 07/25/19- information was again provided to this patient. She wants low-income housing assistance. I explained that the following resources could also assist her with housing as there is a waiting list for that too. Update at 1416: patient asked to go to Group home/family care home.  I have reached out to [facility] with [place]. Update at 1509: patient does not want to give up her check for family care home.

## 2019-07-25 NOTE — ED Notes (Signed)
PT unable to sign d/c due to pt discharge signature pad not working

## 2019-07-26 ENCOUNTER — Other Ambulatory Visit: Payer: Self-pay | Admitting: Obstetrics and Gynecology

## 2019-07-26 DIAGNOSIS — F329 Major depressive disorder, single episode, unspecified: Secondary | ICD-10-CM

## 2019-07-26 DIAGNOSIS — F259 Schizoaffective disorder, unspecified: Secondary | ICD-10-CM

## 2019-07-26 DIAGNOSIS — Z590 Homelessness: Secondary | ICD-10-CM | POA: Diagnosis not present

## 2019-07-26 NOTE — Progress Notes (Signed)
Upon attempted discharge of patient 07/25/2019 around 2330, pt stated "I am suicidal".  Suicidal precautions initiated and patient remained on L& D triage.

## 2019-07-26 NOTE — Progress Notes (Addendum)
Date of service: 07/25/2019  S: The patient arrives to labor and delivery on the evening of 07/25/2019 with complaint of decreased fetal movement. She admits feeling the baby each day but sometimes not as much as other times. She asked me if there was a way that I "could tell how the baby got in there by looking at the ultrasound she had earlier in the day" She also admits that the reason she is in the hospital is because she is homeless and she does not want to sleep outside tonight. Sandwich tray and juice/water given to patient while in triage.   She was seen earlier in the day in the ED for chief complaint of homelessness. On further investigation, care coordinators spent several hours with her in the ED attempting to coordinate housing for her. She ended up declining the offers and discharged to self care.   Patient requests to stay in hospital for a couple days until appropriate housing can be arranged. Patient was asked about any suicidal or homicidal feelings which she denied. Discharge planning was underway last night when patient disclosed to RN that she is suicidal.   I spent over an hour attempting to coordinate care with behavioral health, on call psychiatric NP, hospital care coordinator and unit charge RN. It became clear that resources were limited during night hours and consults were ordered for care to be coordinated in the morning.   Past medical and current diagnoses include:   Decreased fetal movement 07/02/2019  . Club foot, fetal, affecting care of mother, antepartum, single gestation 06/29/2019  . Supervision of high risk pregnancy, antepartum 06/19/2019  . Cocaine abuse with cocaine-induced mood disorder ([place]) 04/15/2019  . Psychoactive substance-induced psychosis ([place]) 05/03/2018  . Bacterial vaginosis 12/25/2017  . Schizoaffective disorder, bipolar type ([place]) 09/02/2017  . PTSD (post-traumatic stress disorder) 09/02/2017  . Bipolar disorder, curr episode mixed, severe,  with psychotic features ([place]) 09/02/2017  . MRSA carrier 07/18/2017  . Bipolar I disorder, most recent episode mixed, severe with psychotic features ([place]) 07/18/2017  . Overdose 07/16/2017  . Aspiration pneumonia ([place]) 07/16/2017  . Acute respiratory failure ([place]) 07/16/2017  . Polysubstance abuse ([place]) 07/16/2017  . Eczema 05/08/2017  . Borderline personality disorder ([place]) 11/06/2016  . Cocaine use disorder, severe, dependence ([place]) 11/06/2016  . Cannabis use disorder, moderate, dependence ([place]) 11/06/2016  . Alcohol use disorder, mild, abuse 11/06/2016  . Asthma 09/23/2011  . Tobacco use disorder 09/15/2009      O: Vital Signs: Ht 5\' 6"  (1.676 m)   Wt 68 kg   LMP  (LMP Unknown)   BMI 24.21 kg/m  Constitutional: well developed female in no acute distress.  HEENT: normal Skin: Warm and dry.  Cardiovascular: Regular rate and rhythm.   Extremity: no edema  Respiratory: Clear to auscultation bilateral. Normal respiratory effort Abdomen: FHT present- patient accepted 4 minutes of monitoring Back: no CVAT Neuro: DTRs 2+, Cranial nerves grossly intact Psych: Alert and Oriented x3. Mild confusion regarding recent care events. Calm/quiet mood and affect.  MS: normal gait, normal bilateral lower extremity ROM/strength/stability.  Pelvic exam: deferred  A: 28 yo G3 P57 female with IUP at 25 weeks 6 days (on 07/25/2019), homeless, suicidal ideation  P: Discontinue discharge order Suicide precautions initiated Psych consult ordered Care Management consult ordered Regular diet   Christean Leaf, [place] [facility] 07/25/2019, 9:13 AM

## 2019-07-26 NOTE — Consult Note (Signed)
[facility] Face[facility] Consult   Reason for Consult:  Suicidal ideations Referring Physician:  Hospitalists Patient Identification: Bridget Carpenter Bridget Carpenter MRN:  [id number] Principal Diagnosis: <principal problem not specified> Diagnosis:  Active Problems:   Indication for care in labor and delivery, antepartum   Homelessness   Total Time spent with patient: 45 minutes  Subjective:   Bridget Carpenter Bridget Carpenter is a 28 y.o. female patient reports that she came to the hospital to get evaluated for pregnancy.  She then states that she needs a place to stay.  She states that she is pregnant and she is homeless and she has no resources.  She reports that she did not go to the group home yesterday because they want to take her Bridget Carpenter.  She is informed that she must pay to have a group home stay and she refuses to go there again.  Patient is back-and-forth with her conversation about where to go and what she wants to do.  Patient states she will go to a homeless shelter and less that is likely home and not just the building that is dirty.  Patient then goes back to the group home and states that she would like to go to [place] and then she asked if she can just get assistance with getting to another town.  Patient states that she feels that she would be trapped if someone is using her SSI to pay for her housing.  She states that she would rather be homeless then for someone to take her Bridget Carpenter for her to have housing.  Reports that she does not count on her mother to help her with anything.  She also details a story of her trying to take advantage of her mother and lying to her about her food stamp card and trying to keep the Bridget Carpenter off of it and she complained that this was her mother's fault that her mother was given her symptom then to get away from her even though she did not use it.  Patient is asked if she is suicidal and she reports "just going out into the world for suicide.  HPI: Patient is a  28 year old female that is [redacted] weeks pregnant.  Patient presented to the ED yesterday requesting an ultrasound to make sure that her baby is okay and was also requesting speak to social work because she is homeless and was looking for a place to stay.  Patient then went to [facility] because she received a phone call from her attorney that she was they are seeking housing.  Then patient was sent back to the hospital [facility].  Patient then [facility]  Patient is seen by this provider.  Face-to-face with social work.  Patient frequents the ED with suicidal ideations and seeking housing.  Patient is manipulative and attempts to use the hospital for secondary gain.  Patient does not report any direct suicide attempt or intention and does not detail any kind of plan.  Patient is also requesting resources to go to another town and to get assistance somewhere other than in [place].  She continues to go back and forth about which resources she wants to use.  It is came to an agreement that we will have social work assist the patient with possible transportation for public last visit as well as seeing if she can be accepted back to her group home again.  At this time the patient does not meet  inpatient criteria and is psychiatric cleared.  This was discussed with the nurse midwife on the floor.  Past Psychiatric History: Psychosis, substance abuse  Risk to Self: Is patient at risk for suicide?: No Risk to Others:   Prior Inpatient Therapy:   Prior Outpatient Therapy:    Past Medical History:  Past Medical History:  Diagnosis Date  . Asthma   . Depression 2009   Inpatient psych admission for SI, dissociative fugue  . Dissociative disorder or reaction 2009  . Eczema   . H/O: suicide attempt   . ODD (oppositional defiant disorder)   . PTSD (post-traumatic stress disorder)   . Schizoaffective disorder (HCC)   . Substance abuse [facility](HCC)     Past  Surgical History:  Procedure Laterality Date  . ADENOIDECTOMY    . TONSILLECTOMY     Family History:  Family History  Adopted: Yes  Problem Relation Age of Onset  . Mental illness Mother   . Mental illness Father   . Heart disease Maternal Grandmother   . Sickle cell trait Adoptive Mother   . Cleft palate Cousin        maternal side   Family Psychiatric  History: See abive Social History:  Social History   Substance and Sexual Activity  Alcohol Use Not Currently  . Alcohol/week: 1.0 standard drinks  . Types: 1 Cans of beer per week   Comment: weekly     Social History   Substance and Sexual Activity  Drug Use Yes  . Types: Marijuana, "Crack" cocaine, Heroin   Comment: 4 grams daily    Social History   Socioeconomic History  . Marital status: Single    Spouse name: Not on file  . Number of children: 1  . Years of education: Not on file  . Highest education level: Not on file  Occupational History  . Not on file  Social Needs  . Financial resource strain: Not on file  . Food insecurity    Worry: Not on file    Inability: Not on file  . Transportation needs    Medical: Not on file    Non-medical: Not on file  Tobacco Use  . Smoking status: Current Every Day Smoker    Packs/day: 0.75    Years: 2.00    Pack years: 1.50    Types: Cigarettes  . Smokeless tobacco: Never Used  Substance and Sexual Activity  . Alcohol use: Not Currently    Alcohol/week: 1.0 standard drinks    Types: 1 Cans of beer per week    Comment: weekly  . Drug use: Yes    Types: Marijuana, "Crack" cocaine, Heroin    Comment: 4 grams daily  . Sexual activity: Not Currently    Partners: Male  Lifestyle  . Physical activity    Days per week: Not on file    Minutes per session: Not on file  . Stress: Not on file  Relationships  . Social Musicianconnections    Talks on phone: Not on file    Gets together: Not on file    Attends religious service: Not on file    Active member of club or  organization: Not on file    Attends meetings of clubs or organizations: Not on file    Relationship status: Not on file  Other Topics Concern  . Not on file  Social History Narrative   Is homeless   Her mother is her daughter's gaurdian   Multiple psychiatric hospitalizations   Substance  use disorder   Additional Social History:    Allergies:   Allergies  Allergen Reactions  . Banana Hives and Swelling  . Grapefruit Extract Itching and Swelling  . Cantaloupe (Diagnostic) Hives    Labs:  Results for orders placed or performed during the hospital encounter of 07/25/19 (from the past 48 hour(s))  Comprehensive metabolic panel     Status: Abnormal   Collection Time: 07/25/19  1:59 PM  Result Value Ref Range   Sodium 140 135 - 145 mmol/L   Potassium 3.7 3.5 - 5.1 mmol/L   Chloride 110 98 - 111 mmol/L   CO2 21 (L) 22 - 32 mmol/L   Glucose, Bld 91 70 - 99 mg/dL   BUN 12 6 - 20 mg/dL   Creatinine, Ser 1.610.58 0.44 - 1.00 mg/dL   Calcium 8.2 (L) 8.9 - 10.3 mg/dL   Total Protein 6.4 (L) 6.5 - 8.1 g/dL   Albumin 3.1 (L) 3.5 - 5.0 g/dL   AST 21 15 - 41 U/L   ALT 10 0 - 44 U/L   Alkaline Phosphatase 39 38 - 126 U/L   Total Bilirubin 0.7 0.3 - 1.2 mg/dL   GFR calc non Af Amer >60 >60 mL/min   GFR calc Af Amer >60 >60 mL/min   Anion gap 9 5 - 15    Comment: Performed at [facility], [address] [place], [place] [address]  CBC with Differential     Status: Abnormal   Collection Time: 07/25/19  1:59 PM  Result Value Ref Range   WBC 7.3 4.0 - 10.5 K/uL   RBC 3.41 (L) 3.87 - 5.11 MIL/uL   Hemoglobin 10.9 (L) 12.0 - 15.0 g/dL   HCT 54.032.6 (L) 98.136.0 - 19.146.0 %   MCV 95.6 80.0 - 100.0 fL   MCH 32.0 26.0 - 34.0 pg   MCHC 33.4 30.0 - 36.0 g/dL   RDW 47.812.1 29.511.5 - 62.115.5 %   Platelets 198 150 - 400 K/uL   nRBC 0.3 (H) 0.0 - 0.2 %   Neutrophils Relative % 63 %   Neutro Abs 4.7 1.7 - 7.7 K/uL   Lymphocytes Relative 23 %   Lymphs Abs 1.7 0.7 - 4.0 K/uL   Monocytes Relative 10 %    Monocytes Absolute 0.7 0.1 - 1.0 K/uL   Eosinophils Relative 3 %   Eosinophils Absolute 0.2 0.0 - 0.5 K/uL   Basophils Relative 0 %   Basophils Absolute 0.0 0.0 - 0.1 K/uL   Immature Granulocytes 1 %   Abs Immature Granulocytes 0.04 0.00 - 0.07 K/uL    Comment: Performed at [facility], [address] [place], [place] [address]    No current facility-administered medications for this encounter.     Musculoskeletal: Strength & Muscle Tone: within normal limits Gait & Station: normal Patient leans: N/A  Psychiatric Specialty Exam: Physical Exam  Nursing note and vitals reviewed. Constitutional: She is oriented to person, place, and time. She appears well-developed and well-nourished.  Cardiovascular: Normal rate.  Respiratory: Effort normal.  Musculoskeletal: Normal range of motion.  Neurological: She is alert and oriented to person, place, and time.    Review of Systems  Constitutional: Negative.   HENT: Negative.   Eyes: Negative.   Respiratory: Negative.   Cardiovascular: Negative.   Gastrointestinal: Negative.   Genitourinary: Negative.   Musculoskeletal: Negative.   Skin: Negative.   Neurological: Negative.   Endo/Heme/Allergies: Negative.   Psychiatric/Behavioral: Positive for depression.    Blood  pressure (!) 91/49, pulse 63, temperature 98.3 F (36.8 C), temperature source Oral, resp. rate 16, height 5\' 6"  (1.676 m), weight 68 kg.Body mass index is 24.21 kg/m.  General Appearance: Casual  Eye Contact:  Good  Speech:  Clear and Coherent and Normal Rate  Volume:  Normal  Mood:  Depressed  Affect:  Flat  Thought Process:  Coherent and Descriptions of Associations: Intact  Orientation:  Full (Time, Place, and Person)  Thought Content:  WDL and Logical  Suicidal Thoughts:  No  Homicidal Thoughts:  No  Memory:  Immediate;   Good Recent;   Good Remote;   Good  Judgement:  Good  Insight:  Fair  Psychomotor Activity:  Normal  Concentration:   Concentration: Good  Recall:  Good  Fund of Knowledge:  Good  Language:  Good  Akathisia:  No  Handed:  Right  AIMS (if indicated):     Assets:  Communication Skills Desire for Improvement Financial Resources/Insurance Physical Health Resilience  ADL's:  Intact  Cognition:  WNL  Sleep:        Treatment Plan Summary: Social work consult for housing and transportation  Disposition: No evidence of imminent risk to self or others at present.   Patient does not meet criteria for psychiatric inpatient admission. Supportive therapy provided about ongoing stressors. Discussed crisis plan, support from social network, calling 911, coming to the Emergency Department, and calling Suicide Hotline.  [place], FNP 07/26/2019 10:47 AM

## 2019-07-26 NOTE — Progress Notes (Signed)
Behavioral NP and SW here to assess patient.

## 2019-07-26 NOTE — Progress Notes (Signed)
OB History & Physical   History of Present Illness:  Chief Complaint:  Presented with decreased fetal movement, then stated she was suicidal. HPI:  Bridget Carpenter is a 28 y.o. G103P1011 female with EDC=11/3/2020at [redacted]w[redacted]d dated by a 12wk6d ultrasound.  Her pregnancy has been significantly complicated by schizoaffective disorder, paranoia, and history of substance abuse. She has had multiple admissions for psychiatric problems and has had past suicidal attempts.  Her prenatal care is also remarkable for late entry, tobacco use,  and baby has club feet. She has been compliant with her prenatal care and states that she has stopped using cocaine since her initial prenatal visit 06/17/2019. She is not on any psychiatric medications. She is homeless and was placed in a care home, but she refuses to go to the home as she is convinced that someone is trying to kill her baby. She was placed on Keflex by the ED, but stopped taking them because she feared that someone at the group home had poisoned them.   She presented to L&D for evaluation of decreased fetal movement, but only allowed a few minutes of monitoring. Upon discharge she stated that she was suicidal and was placed on suicide precautions with a sitter. Case management was consulted in ED yesterday when she presented for homelessness and she was given options for shelter, but she returned later with complaints of decreased fetal movement. Denies LOF, vaginal bleeding or contractions.  Prenatal care site: Prenatal care at [facility] has also  been remarkable for   [facility] Prenatal Labs  Dating 12 wk 6 d [place] Blood type: A/Positive/-- (06/19 1002)   Genetic Screen Late entry Antibody:Negative (06/19 1002)  Anatomic [place] Baby girl, clubfeet, posterior placenta Rubella: non immune Varicella: immune  GTT Early:               Third trimester:  RPR: Non Reactive (06/19 1002)   Rhogam  HBsAg: Negative (06/19 1002)   TDaP vaccine                        Flu Shot: HIV: Non Reactive (06/19 1002)   Baby Food                                GBS:   Contraception  Pap:  CBB     CS/VBAC    Support Person         Maternal Medical History:   Past Medical History:  Diagnosis Date  . Asthma   . Depression 2009   Inpatient psych admission for SI, dissociative fugue  . Dissociative disorder or reaction 2009  . Eczema   . H/O: suicide attempt   . ODD (oppositional defiant disorder)   . PTSD (post-traumatic stress disorder)   . Schizoaffective disorder ([place])   . Substance abuse [facility])     Past Surgical History:  Procedure Laterality Date  . ADENOIDECTOMY    . TONSILLECTOMY      Allergies  Allergen Reactions  . Banana Hives and Swelling  . Grapefruit Extract Itching and Swelling  . Cantaloupe (Diagnostic) Hives    Prior to Admission medications   Medication Sig Start Date End Date Taking? Authorizing Provider  permethrin (ELIMITE) 5 % cream Apply from forehead to soles of feet, sparing the area around the eyes and leave on 10 hours, then shower off. Repeat in 1 week. Patient not taking: Reported on 07/25/2019  06/30/19   Triplett, Cari B, FNP  Prenat-FeCbn-FeAsp-Meth-FA-DHA (PRENATE MINI) 18-0.6-0.4-350 MG CAPS Take 1 capsule by mouth daily. Patient not taking: Reported on 07/25/2019 06/17/19   Farrel ConnersGutierrez, Maci Eickholt, CNM  triamcinolone cream (KENALOG) 0.1 % Apply 1 application topically 4 (four) times daily. Patient not taking: Reported on 07/25/2019 07/18/19   Orvil FeilWoods, Jaclyn M, PA-C          Social History: She  reports that she has been smoking cigarettes. She has a 1.50 pack-year smoking history. She has never used smokeless tobacco. She reports previous alcohol use of about 1.0 standard drinks of alcohol per week. She reports current drug use. Drugs: Marijuana, "Crack" cocaine, and Heroin.  Family History: family history includes Cleft palate in her cousin; Heart disease in her maternal grandmother; Mental illness in her father and  mother; Sickle cell trait in her adoptive mother. She was adopted.   Review of Systems: Negative x 10 systems reviewed except as noted in the HPI.      Physical Exam:  Vital Signs: BP (!) 91/49 (BP Location: Left Arm)   Pulse 63   Temp 98.3 F (36.8 C) (Oral)   Resp 16   Ht 5\' 6"  (1.676 m)   Wt 68 kg   LMP  (LMP Unknown)   BMI 24.21 kg/m   General: gravid BF in no acute distress.  HEENT: normocephalic, atraumatic Heart: regular rate & rhythm.  No murmurs/rubs/gallops Lungs: clear to auscultation bilaterally Abdomen: soft, gravid, non-tender;  FHT 125, FH 28cm Extremities: non-tender, symmetric, no edema bilaterally.  Neurologic: Alert & oriented x 3.   Psyche:   Assessment:  Bridget Carpenter is a 28 y.o. 213P1011 female at 4284w0d with schizoaffective disorder and suicidal ideation.  Plan:  1. Admitted to L&D for observation/ on suicidal precautions 2. Consult behavioral medicine 3. Consult case management. 4. Regular diet  Farrel ConnersColleen Latonja Bobeck  07/26/2019 8:50 AM

## 2019-07-26 NOTE — Discharge Summary (Signed)
Physician Discharge Summary   Patient ID: Bridget Carpenter [id number] 28 y.o. 06/12/1991  Admit date: 07/25/2019  Discharge date and time: No discharge date for patient encounter.   Admitting Physician: Vena AustriaAndreas Staebler, MD   Discharge Physician: Adelene Idlerhristanna Anquinette Pierro MD   Admission Diagnoses: 24 Weeks  Discharge Diagnoses: Homelessness, schizoaffective disorder  Admission Condition: good  Discharged Condition: good  Indication for Admission: Patient was admitted  Hospital Course: Patient was admitted for complaints of decreased fetal movement and suicidal ideation, but she mostly is seaching for a place to sleep because she is homeless. Many efforts have been made to find ehr housing, but these arrangments have not been to her satisfaction. She was finally able to be discharged to [facility] the [facility] [place] which is a state run home for homeless women.   Consults: None  Significant Diagnostic Studies: none  Treatments: shower  Discharge Exam: BP (!) 91/49 (BP Location: Left Arm)   Pulse 63   Temp 98.3 F (36.8 C) (Oral)   Resp 16   Ht 5\' 6"  (1.676 m)   Wt 68 kg   LMP  (LMP Unknown)   BMI 24.21 kg/m   General Appearance:    Alert, cooperative, no distress, appears stated age  Head:    Normocephalic, without obvious abnormality, atraumatic  Eyes:    PERRL, conjunctiva/corneas clear, EOM's intact, fundi    benign, both eyes  Ears:    Normal TM's and external ear canals, both ears  Nose:   Nares normal, septum midline, mucosa normal, no drainage    or sinus tenderness  Throat:   Lips, mucosa, and tongue normal; teeth and gums normal  Neck:   Supple, symmetrical, trachea midline, no adenopathy;    thyroid:  no enlargement/tenderness/nodules; no carotid   bruit or JVD  Back:     Symmetric, no curvature, ROM normal, no CVA tenderness  Lungs:     Clear to auscultation bilaterally, respirations unlabored  Chest Wall:    No tenderness or deformity   Heart:     Regular rate and rhythm, S1 and S2 normal, no murmur, rub   or gallop  Breast Exam:    No tenderness, masses, or nipple abnormality  Abdomen:     Soft, non-tender, bowel sounds active all four quadrants,    no masses, no organomegaly  Genitalia:    Normal female without lesion, discharge or tenderness  Rectal:    Normal tone, normal prostate, no masses or tenderness;   guaiac negative stool  Extremities:   Extremities normal, atraumatic, no cyanosis or edema  Pulses:   2+ and symmetric all extremities  Skin:   Skin color, texture, turgor normal, no rashes or lesions  Lymph nodes:   Cervical, supraclavicular, and axillary nodes normal  Neurologic:   CNII-XII intact, normal strength, sensation and reflexes    throughout    Disposition: Discharge disposition: 70-Another Health Care Institution Not Defined       Patient Instructions:  Allergies as of 07/26/2019      Reactions   Banana Hives, Swelling   Grapefruit Extract Itching, Swelling   Cantaloupe (diagnostic) Hives      Medication List    STOP taking these medications   permethrin 5 % cream Commonly known as: ELIMITE   triamcinolone cream 0.1 % Commonly known as: KENALOG     TAKE these medications   Prenate Mini 18-0.6-0.4-350 MG Caps Take 1 capsule by mouth daily.     ASK your doctor about these  medications   cephALEXin 500 MG capsule Commonly known as: KEFLEX Take 1 capsule (500 mg total) by mouth 3 (three) times daily for 7 days. Ask about: Should I take this medication?   metroNIDAZOLE 500 MG tablet Commonly known as: Flagyl Take 1 tablet (500 mg total) by mouth 2 (two) times daily for 7 days. Ask about: Should I take this medication?      Activity: activity as tolerated Diet: regular diet Wound Care: none needed  Follow-up with OBGYN provider in 2 weeks.  Signed: Homero Fellers 07/26/2019 1:18 PM

## 2019-07-26 NOTE — TOC Progression Note (Signed)
Transition of Care [facility]) - Progression Note    Patient Details  Name: Bridget Carpenter MRN: [id number] Date of Birth: 1991/04/03  [facility]) CM/SW Contact  Tymber Stallings, Lenice Llamas Phone Number: [phone]  07/26/2019, 2:03 PM  Clinical Narrative: Clinical Social Worker (CSW) confirmed with [employer] taxi company that a driver is on the way to pick patient up now.       Expected Discharge Plan: Group Home([facility]) Barriers to Discharge: Barriers Resolved  Expected Discharge Plan and Services Expected Discharge Plan: Group Home([facility]) In-house Referral: Clinical Social Work Discharge Planning Services: CM Consult   Living arrangements for the past 2 months: Homeless Expected Discharge Date: 07/26/19                                     Social Determinants of Health (SDOH) Interventions    Readmission Risk Interventions No flowsheet data found.

## 2019-07-26 NOTE — TOC Initial Note (Signed)
Transition of Care [facility]) - Initial/Assessment Note    Patient Details  Name: Bridget Carpenter MRN: [id number] Date of Birth: 1991-11-07  Transition of Care [facility]) CM/SW Contact:    Claudis Giovanelli, Lenice Llamas Phone Number: [phone]  07/26/2019, 12:29 PM  Clinical Narrative:  Clinical Social Worker (CSW) received consult for homelessness. ED CSW and ED RN case manager have worked with patient recently. Patient has had frequent visits to the ED. Psych has assessed patient today and recommended that an inpatient psych is not needed. CSW met with patient at bedside to offer resources. Patient was alert and oriented X4 and was sitting up in the bed. Patient reported that she is homeless and has been living on the street. Patient reported that she does not want to go to a shelter or go to a place that she will have to sign over her SSI check. Patient reported that she has medicaid and SSI income. Patient reported that she has no where to go and can't go stay with her mother. Patient reported that she has been homeless since the day she was born. CSW provided emotional support and [facility] resources that include resources for housing, food and substance abuse. CSW explained that Room at the [facility] in [place] is a [place] that is funded by the state and helps pregnant women that are experiencing homelessness. Patient agreed to talk with Hassan Rowan staff member at Room at [facility] and completed a telephone intake with her today. Per Hassan Rowan they would like to see patient at 3 pm today at the [facility] located at [facility] [place] for an in person interview. Per Hassan Rowan if they accept patient after the interview then patient can stay at the [facility]. Hassan Rowan reported that patient will need a photo ID and proof of pregnancy signed by a MD. Patient reported that she does not have a photo ID. CSW found a copy of a photo [place] ID of patient scanned into the chart from 2016 and per Hassan Rowan they  will accept that. CSW printed copy of photo [place] ID and printed a copy of medicaid card and gave it to RN to give to patient. Hassan Rowan also requested medical documentation of proof of pregnancy signed by a doctor. Midwife and RN aware of above and will get patient that documentation. CSW will provide taxi voucher to patient. [employer] taxi company has agreed to pick patient up at 2 pm and transport her to the [facility] in [place]. Per Hassan Rowan if patient does not get accepted at the [facility] they will try to send her to [place] or to a hospital. TOC lead is aware of above. Please reconsult if future social work needs arise. CSW signing off.     Expected Discharge Plan: Group Home([facility] at [facility]) Barriers to Discharge: Barriers Resolved   Patient Goals and CMS Choice Patient states their goals for this hospitalization and ongoing recovery are:: To find housing   Choice offered to / list presented to : Patient  Expected Discharge Plan and Services Expected Discharge Plan: Group Home(Room at [facility]) In-house Referral: Clinical Social Work Discharge Planning Services: CM Consult   Living arrangements for the past 2 months: Homeless Expected Discharge Date: 07/25/19                                    Prior Living  Arrangements/Services Living arrangements for the past 2 months: Homeless Lives with:: Self Patient language and need for interpreter reviewed:: No Do you feel safe going back to the place where you live?: No   Patient does not have a home.  Need for Family Participation in Patient Care: No (Comment) Care giver support system in place?: No (comment)   Criminal Activity/Legal Involvement Pertinent to Current Situation/Hospitalization: No - Comment as needed  Activities of Daily Living      Permission Sought/Granted Permission sought to share information with : Other (comment)(Room at [facility]) [place] granted to share information with : Yes, Verbal Permission Granted              Emotional Assessment Appearance:: Appears stated age Attitude/Demeanor/Rapport: Inconsistent Affect (typically observed): Calm, Accepting Orientation: : Oriented to Self, Oriented to Place, Oriented to  Time, Oriented to Situation Alcohol / Substance Use: Other (comment)(Patient denies alcohol and substance use.) Psych Involvement: Yes (comment)(Psych cleared patient.)  Admission diagnosis:  24 Weeks Patient Active Problem List   Diagnosis Date Noted  . Indication for care in labor and delivery, antepartum 07/25/2019  . Homelessness 07/25/2019  . Decreased fetal movement 07/02/2019  . Club foot, fetal, affecting care of mother, antepartum, single gestation 06/29/2019  . Supervision of high risk pregnancy, antepartum 06/19/2019  . Cocaine abuse with cocaine-induced mood disorder ([place]) 04/15/2019  . Psychoactive substance-induced psychosis ([place]) 05/03/2018  . Bacterial vaginosis 12/25/2017  . Schizoaffective disorder, bipolar type ([place]) 09/02/2017  . PTSD (post-traumatic stress disorder) 09/02/2017  . Bipolar disorder, curr episode mixed, severe, with psychotic features ([place]) 09/02/2017  . MRSA carrier 07/18/2017  . Bipolar I disorder, most recent episode mixed, severe with psychotic features ([place]) 07/18/2017  . Overdose 07/16/2017  . Aspiration pneumonia ([place]) 07/16/2017  . Acute respiratory failure ([place]) 07/16/2017  . Polysubstance abuse ([place]) 07/16/2017  . Eczema 05/08/2017  . Borderline personality disorder ([place]) 11/06/2016  . Cocaine use disorder, severe, dependence ([place]) 11/06/2016  . Cannabis use disorder, moderate, dependence ([place]) 11/06/2016  . Alcohol use disorder, mild, abuse 11/06/2016  . Asthma 09/23/2011  . Tobacco use disorder 09/15/2009   PCP:  Patient, No Pcp Per Pharmacy:   [place], [place] AT [facility] [address] [place] [address] Phone: [phone] Fax: [phone]     Social Determinants of Health (SDOH) Interventions    Readmission Risk Interventions No flowsheet data found.

## 2019-07-27 NOTE — Progress Notes (Signed)
7/29: Clinical Education officer, museum (CSW) contacted Ambulance person member at Room at [facility]. Per Hassan Rowan they did accept patient into the program and patient will be able to live at the [facility] during her pregnancy.   [employer], LCSW [phone]

## 2019-07-28 ENCOUNTER — Inpatient Hospital Stay (HOSPITAL_COMMUNITY)
Admission: AD | Admit: 2019-07-28 | Discharge: 2019-07-28 | Disposition: A | Discharge status: Home / Self Care | Payer: Medicaid Other | Attending: Obstetrics & Gynecology | Admitting: Obstetrics & Gynecology

## 2019-07-28 ENCOUNTER — Encounter (HOSPITAL_COMMUNITY): Payer: Self-pay | Admitting: *Deleted

## 2019-07-28 ENCOUNTER — Other Ambulatory Visit: Payer: Self-pay

## 2019-07-28 DIAGNOSIS — O99332 Smoking (tobacco) complicating pregnancy, second trimester: Secondary | ICD-10-CM | POA: Insufficient documentation

## 2019-07-28 DIAGNOSIS — M549 Dorsalgia, unspecified: Secondary | ICD-10-CM | POA: Diagnosis not present

## 2019-07-28 DIAGNOSIS — F17210 Nicotine dependence, cigarettes, uncomplicated: Secondary | ICD-10-CM | POA: Insufficient documentation

## 2019-07-28 DIAGNOSIS — O99342 Other mental disorders complicating pregnancy, second trimester: Secondary | ICD-10-CM | POA: Insufficient documentation

## 2019-07-28 DIAGNOSIS — F250 Schizoaffective disorder, bipolar type: Secondary | ICD-10-CM

## 2019-07-28 DIAGNOSIS — F259 Schizoaffective disorder, unspecified: Secondary | ICD-10-CM | POA: Insufficient documentation

## 2019-07-28 DIAGNOSIS — Z3A26 26 weeks gestation of pregnancy: Secondary | ICD-10-CM | POA: Insufficient documentation

## 2019-07-28 DIAGNOSIS — O26892 Other specified pregnancy related conditions, second trimester: Secondary | ICD-10-CM | POA: Diagnosis present

## 2019-07-28 DIAGNOSIS — O0990 Supervision of high risk pregnancy, unspecified, unspecified trimester: Secondary | ICD-10-CM

## 2019-07-28 LAB — URINALYSIS, ROUTINE W REFLEX MICROSCOPIC
Bilirubin Urine: NEGATIVE
Glucose, UA: NEGATIVE mg/dL
Hgb urine dipstick: NEGATIVE
Ketones, ur: NEGATIVE mg/dL
Leukocytes,Ua: NEGATIVE
Nitrite: NEGATIVE
Protein, ur: NEGATIVE mg/dL
Specific Gravity, Urine: 1.026 (ref 1.005–1.030)
pH: 5 (ref 5.0–8.0)

## 2019-07-28 MED ORDER — ACETAMINOPHEN 500 MG PO TABS
1000.0000 mg | ORAL_TABLET | Freq: Once | ORAL | Status: AC
  Administered 2019-07-28: 1000 mg via ORAL
  Filled 2019-07-28: qty 2

## 2019-07-28 NOTE — MAU Note (Signed)
Pt reports to MAU via EMS. Pt states she woke up because she felt her throat was clogged. Pt report she spit up pink. Pt reports she was feeling itchy and her skin was burning so she took a shower. Pt reports then still spitting up pink. Pt reports right sided pain. Pt reports the baby is still. Pt scared the baby is dead. Pt informed of FHR 130 and maternal HR 87 pt still concerned. Pt states what she eats her baby eats and she wants to know why she is spitting pink. Pt states she thinks someone tried to kill her in her sleep.

## 2019-07-28 NOTE — MAU Note (Signed)
Emly CNM doing bedside sono.

## 2019-07-28 NOTE — MAU Provider Note (Signed)
History     CSN: [id number]  Arrival date and time: 07/28/19 [id number]   First Provider Initiated Contact with Patient 07/28/19 0606      No chief complaint on file.  [name] Karie KirksShayan Hieronymus is a 28 y.o. G3P1011 at 4633w2d who receives care at [facility].  She does suffer from schizoaffective disorder and denies SI/HI or behaviors. She presents today stating she was poisoned because she is "coughing up pink stuff."  She states she noticed it around 0430am this morning and thinks that one of the residents at her facility gave her something "to be spiteful."  Patient denies any issues with fellow residents and further states that she is "always respectful and kind." However, patient requests admission so that she can have "a safe place to stay" for a couple day.  Patient reports that she ate a "purple cupcake" last night and did not brush her teeth prior to bed or upon waking.    Patient expresses anxiety regarding fetal well-being and states "I think my baby is dying because of the poison."   She reports fetal movement, but states "I don't know if I can believe it or not."  She denies contractions or vaginal concerns including discharge, leaking, or bleeding. Patient endorses some back pain and rates it a 10/10 stating it has been occurring for awhile. She also reports having some right side abdominal tenderness "when that guy in the ambulance touched there," but denies current pain or tenderness.  Patient reports some chest pain and states it is from the poison. However, patient declines transfer to ER for further evaluation.      OB History    Gravida  3   Para  1   Term  1   Preterm      AB  1   Living  1     SAB      TAB      Ectopic  1   Multiple      Live Births  1           Past Medical History:  Diagnosis Date  . Asthma   . Depression 2009   Inpatient psych admission for SI, dissociative fugue  . Dissociative disorder or reaction 2009  . Eczema   . H/O:  suicide attempt   . ODD (oppositional defiant disorder)   . PTSD (post-traumatic stress disorder)   . Schizoaffective disorder (HCC)   . Substance abuse [facility](H[facility])     Past Surgical History:  Procedure Laterality Date  . ADENOIDECTOMY    . TONSILLECTOMY      Family History  Adopted: Yes  Problem Relation Age of Onset  . Mental illness Mother   . Mental illness Father   . Heart disease Maternal Grandmother   . Sickle cell trait Adoptive Mother   . Cleft palate Cousin        maternal side    Social History   Tobacco Use  . Smoking status: Current Every Day Smoker    Packs/day: 0.75    Years: 2.00    Pack years: 1.50    Types: Cigarettes  . Smokeless tobacco: Never Used  Substance Use Topics  . Alcohol use: Not Currently    Alcohol/week: 1.0 standard drinks    Types: 1 Cans of beer per week    Comment: weekly  . Drug use: Not Currently    Types: Marijuana, "Crack" cocaine, Heroin    Comment: 4 grams daily    Allergies:  Allergies  Allergen Reactions  . Banana Hives and Swelling  . Grapefruit Extract Itching and Swelling  . Cantaloupe (Diagnostic) Hives    Medications Prior to Admission  Medication Sig Dispense Refill Last Dose  . Prenat-FeCbn-FeAsp-Meth-FA-DHA (PRENATE MINI) 18-0.6-0.4-350 MG CAPS Take 1 capsule by mouth daily. (Patient not taking: Reported on 07/25/2019) 30 capsule 11     Review of Systems  Constitutional: Negative for chills and fever.  Respiratory: Positive for cough. Negative for shortness of breath.   Gastrointestinal: Negative for abdominal pain, constipation, diarrhea, nausea and vomiting.  Genitourinary: Negative for difficulty urinating, dysuria, vaginal bleeding and vaginal discharge.  Musculoskeletal: Positive for back pain (Lower back-Midline).  Neurological: Negative for dizziness, light-headedness and headaches.   Physical Exam   Blood pressure 112/71, pulse 83, temperature (!) 97.5 F (36.4 C), temperature source Oral, resp.  rate 20, SpO2 100 %.  Physical Exam  Constitutional: She is oriented to person, place, and time. She appears well-developed and well-nourished. No distress.  HENT:  Head: Normocephalic and atraumatic.  Eyes: Conjunctivae are normal.  Neck: Normal range of motion.  Cardiovascular: Normal rate.  Respiratory: Effort normal.  GI: Soft. There is no abdominal tenderness.  Musculoskeletal: Normal range of motion.  Neurological: She is alert and oriented to person, place, and time.  Skin: Skin is warm and dry.  Psychiatric: She has a normal mood and affect. Her speech is normal and behavior is normal. Judgment normal. Thought content is paranoid. Cognition and memory are normal. She expresses no homicidal and no suicidal ideation.    Fetal Assessment 125 bpm, Mod Var, -Decels, -Accels Toco: None graphed  MAU Course  No results found for this or any previous visit (from the past 24 hour(s)). No results found.  MDM PE EFM  Assessment and Plan  28 year old G3P1001  SIUP at 26.2weeks Cat I FT Schizoaffective D/O   -Exam findings discussed -Reassured that EFM shows active fetal heart rate. -Extensive discussion regarding unlikelihood of poisoning.  -Patient agreeable to performing oral hygiene and reassessing color of saliva. -Toothbrush, toothpaste, and water given. -Will give tylenol for back pain. -Will perform bedside ultrasound for patient reassurance. -Patient agreeable to POC and has no questions. -Instructed to not move EFM. -Patient requests food.  Will order regular diet.  -Will reassess after tylenol dosing.   Reassessment (7:27 AM)  -EFM removed by nurse as patient reports that it was shocking her.  -Strip reviewed and reassuring.  -Patient reports improvement in back pain.  -Patient also reports improvement in saliva color with completion of oral hygiene.  -BSUS performed with good fetal movement and patient expresses reassurance.  -Awaiting food,but informed  that discharge orders would be placed. -Patient questions if provider thought she was poisoned and provider reiterated that that was unlikely. -Instructed to keep next scheduled PNV-patient reports it is tomorrow.  -Encouraged to call or return to MAU if symptoms worsen or with the onset of new symptoms. -Discharged to home in stable condition.   Maryann Conners MSN, CNM 07/28/2019, 6:06 AM

## 2019-07-28 NOTE — MAU Note (Addendum)
Pt removed monitor, says she felt like it shocked her.CNM notified.  (Indicated the toco, not the FH monitor)

## 2019-07-28 NOTE — Discharge Instructions (Signed)
Abdominal Pain During Pregnancy  Belly (abdominal) pain is common during pregnancy. There are many possible causes. Most of the time, it is not a serious problem. Other times, it can be a sign that something is wrong with the pregnancy. Always tell your doctor if you have belly pain. Follow these instructions at home:  Do not have sex or put anything in your vagina until your pain goes away completely.  Get plenty of rest until your pain gets better.  Drink enough fluid to keep your pee (urine) pale yellow.  Take over-the-counter and prescription medicines only as told by your doctor.  Keep all follow-up visits as told by your doctor. This is important. Contact a doctor if:  Your pain continues or gets worse after resting.  You have lower belly pain that: ? Comes and goes at regular times. ? Spreads to your back. ? Feels like menstrual cramps.  You have pain or burning when you pee (urinate). Get help right away if:  You have a fever or chills.  You have vaginal bleeding.  You are leaking fluid from your vagina.  You are passing tissue from your vagina.  You throw up (vomit) for more than 24 hours.  You have watery poop (diarrhea) for more than 24 hours.  Your baby is moving less than usual.  You feel very weak or faint.  You have shortness of breath.  You have very bad pain in your upper belly. Summary  Belly (abdominal) pain is common during pregnancy. There are many possible causes.  If you have belly pain during pregnancy, tell your doctor right away.  Keep all follow-up visits as told by your doctor. This is important. This information is not intended to replace advice given to you by your health care provider. Make sure you discuss any questions you have with your health care provider. Document Released: 12/03/2009 Document Revised: 04/04/2019 Document Reviewed: 03/19/2017 Elsevier Patient Education  [place]. Back Pain in Pregnancy Back  pain during pregnancy is common. Back pain may be caused by several factors that are related to changes during your pregnancy. Follow these instructions at home: Managing pain, stiffness, and swelling      If directed, for sudden (acute) back pain, put ice on the painful area. ? Put ice in a plastic bag. ? Place a towel between your skin and the bag. ? Leave the ice on for 20 minutes, 2-3 times per day.  If directed, apply heat to the affected area before you exercise. Use the heat source that your health care provider recommends, such as a moist heat pack or a heating pad. ? Place a towel between your skin and the heat source. ? Leave the heat on for 20-30 minutes. ? Remove the heat if your skin turns bright red. This is especially important if you are unable to feel pain, heat, or cold. You may have a greater risk of getting burned.  If directed, massage the affected area. Activity  Exercise as told by your health care provider. Gentle exercise is the best way to prevent or manage back pain.  Listen to your body when lifting. If lifting hurts, ask for help or bend your knees. This uses your leg muscles instead of your back muscles.  Squat down when picking up something from the floor. Do not bend over.  Only use bed rest for short periods as told by your health care provider. Bed rest should only be used for the most severe episodes  of back pain. Standing, sitting, and lying down  Do not stand in one place for long periods of time.  Use good posture when sitting. Make sure your head rests over your shoulders and is not hanging forward. Use a pillow on your lower back if necessary.  Try sleeping on your side, preferably the left side, with a pregnancy support pillow or 1-2 regular pillows between your legs. ? If you have back pain after a night's rest, your bed may be too soft. ? A firm mattress may provide more support for your back during pregnancy. General instructions  Do  not wear high heels.  Eat a healthy diet. Try to gain weight within your health care provider's recommendations.  Use a maternity girdle, elastic sling, or back brace as told by your health care provider.  Take over-the-counter and prescription medicines only as told by your health care provider.  Work with a physical therapist or massage therapist to find ways to manage back pain. Acupuncture or massage therapy may be helpful.  Keep all follow-up visits as told by your health care provider. This is important. Contact a health care provider if:  Your back pain interferes with your daily activities.  You have increasing pain in other parts of your body. Get help right away if:  You develop numbness, tingling, weakness, or problems with the use of your arms or legs.  You develop severe back pain that is not controlled with medicine.  You have a change in bowel or bladder control.  You develop shortness of breath, dizziness, or you faint.  You develop nausea, vomiting, or sweating.  You have back pain that is a rhythmic, cramping pain similar to labor pains. Labor pain is usually 1-2 minutes apart, lasts for about 1 minute, and involves a bearing down feeling or pressure in your pelvis.  You have back pain and your water breaks or you have vaginal bleeding.  You have back pain or numbness that travels down your leg.  Your back pain developed after you fell.  You develop pain on one side of your back.  You see blood in your urine.  You develop skin blisters in the area of your back pain. Summary  Back pain may be caused by several factors that are related to changes during your pregnancy.  Follow instructions as told by your health care provider for managing pain, stiffness, and swelling.  Exercise as told by your health care provider. Gentle exercise is the best way to prevent or manage back pain.  Take over-the-counter and prescription medicines only as told by your  health care provider.  Keep all follow-up visits as told by your health care provider. This is important. This information is not intended to replace advice given to you by your health care provider. Make sure you discuss any questions you have with your health care provider. Document Released: 03/25/2006 Document Revised: 04/05/2019 Document Reviewed: 06/02/2018 Elsevier Patient Education  2020 [employer]

## 2019-07-29 ENCOUNTER — Ambulatory Visit (INDEPENDENT_AMBULATORY_CARE_PROVIDER_SITE_OTHER): Payer: Medicaid Other | Admitting: Certified Nurse Midwife

## 2019-07-29 ENCOUNTER — Other Ambulatory Visit: Payer: Self-pay

## 2019-07-29 ENCOUNTER — Encounter: Payer: Medicaid Other | Admitting: Certified Nurse Midwife

## 2019-07-29 VITALS — BP 90/60 | Wt 150.0 lb

## 2019-07-29 DIAGNOSIS — O0990 Supervision of high risk pregnancy, unspecified, unspecified trimester: Secondary | ICD-10-CM

## 2019-07-29 DIAGNOSIS — O9989 Other specified diseases and conditions complicating pregnancy, childbirth and the puerperium: Secondary | ICD-10-CM

## 2019-07-29 DIAGNOSIS — Z131 Encounter for screening for diabetes mellitus: Secondary | ICD-10-CM

## 2019-07-29 DIAGNOSIS — Z3A26 26 weeks gestation of pregnancy: Secondary | ICD-10-CM

## 2019-07-29 DIAGNOSIS — L309 Dermatitis, unspecified: Secondary | ICD-10-CM

## 2019-07-29 DIAGNOSIS — Z113 Encounter for screening for infections with a predominantly sexual mode of transmission: Secondary | ICD-10-CM

## 2019-07-29 DIAGNOSIS — Z130 Encounter for screening for diseases of the blood and blood-forming organs and certain disorders involving the immune mechanism: Secondary | ICD-10-CM

## 2019-07-29 DIAGNOSIS — F250 Schizoaffective disorder, bipolar type: Secondary | ICD-10-CM

## 2019-07-29 LAB — POCT URINALYSIS DIPSTICK OB
Glucose, UA: NEGATIVE
POC,PROTEIN,UA: NEGATIVE

## 2019-07-29 MED ORDER — CEPHALEXIN 500 MG PO CAPS: 500.0000 mg | ORAL_CAPSULE | Freq: Three times a day (TID) | ORAL | 2 refills | Status: DC

## 2019-07-29 MED ORDER — TRIAMCINOLONE ACETONIDE 0.1 % EX CREA: 1.0000 "application " | TOPICAL_CREAM | Freq: Two times a day (BID) | CUTANEOUS | 1 refills | Status: DC

## 2019-07-29 NOTE — Progress Notes (Signed)
ROB- vaginal discharge without odor, no itchiness, some irritation x few months

## 2019-07-30 ENCOUNTER — Inpatient Hospital Stay
Admission: EM | Admit: 2019-07-30 | Discharge: 2019-08-01 | DRG: 832 | Disposition: A | Discharge status: Home / Self Care | Payer: Medicaid Other | Attending: Obstetrics and Gynecology | Admitting: Obstetrics and Gynecology

## 2019-07-30 ENCOUNTER — Other Ambulatory Visit: Payer: Self-pay

## 2019-07-30 DIAGNOSIS — F4310 Post-traumatic stress disorder, unspecified: Secondary | ICD-10-CM | POA: Diagnosis present

## 2019-07-30 DIAGNOSIS — R109 Unspecified abdominal pain: Secondary | ICD-10-CM | POA: Diagnosis present

## 2019-07-30 DIAGNOSIS — O99332 Smoking (tobacco) complicating pregnancy, second trimester: Secondary | ICD-10-CM | POA: Diagnosis present

## 2019-07-30 DIAGNOSIS — O26892 Other specified pregnancy related conditions, second trimester: Secondary | ICD-10-CM

## 2019-07-30 DIAGNOSIS — F17210 Nicotine dependence, cigarettes, uncomplicated: Secondary | ICD-10-CM | POA: Diagnosis present

## 2019-07-30 DIAGNOSIS — O35HXX0 Maternal care for other (suspected) fetal abnormality and damage, fetal lower extremities anomalies, not applicable or unspecified: Secondary | ICD-10-CM

## 2019-07-30 DIAGNOSIS — Z5900 Homelessness unspecified: Secondary | ICD-10-CM

## 2019-07-30 DIAGNOSIS — O358XX0 Maternal care for other (suspected) fetal abnormality and damage, not applicable or unspecified: Secondary | ICD-10-CM | POA: Diagnosis present

## 2019-07-30 DIAGNOSIS — O99342 Other mental disorders complicating pregnancy, second trimester: Secondary | ICD-10-CM | POA: Diagnosis present

## 2019-07-30 DIAGNOSIS — R45851 Suicidal ideations: Secondary | ICD-10-CM | POA: Diagnosis present

## 2019-07-30 DIAGNOSIS — O26899 Other specified pregnancy related conditions, unspecified trimester: Secondary | ICD-10-CM

## 2019-07-30 DIAGNOSIS — F603 Borderline personality disorder: Secondary | ICD-10-CM | POA: Diagnosis present

## 2019-07-30 DIAGNOSIS — R102 Pelvic and perineal pain: Secondary | ICD-10-CM

## 2019-07-30 DIAGNOSIS — Z3A26 26 weeks gestation of pregnancy: Secondary | ICD-10-CM

## 2019-07-30 DIAGNOSIS — Z20828 Contact with and (suspected) exposure to other viral communicable diseases: Secondary | ICD-10-CM | POA: Diagnosis present

## 2019-07-30 MED ORDER — ACETAMINOPHEN 325 MG PO TABS
650.0000 mg | ORAL_TABLET | Freq: Four times a day (QID) | ORAL | Status: DC | PRN
  Administered 2019-07-30 – 2019-08-01 (×2): 650 mg via ORAL
  Filled 2019-07-30: qty 2

## 2019-07-30 MED ORDER — ACETAMINOPHEN 325 MG PO TABS
ORAL_TABLET | ORAL | Status: AC
  Administered 2019-07-30: 650 mg via ORAL
  Filled 2019-07-30: qty 2

## 2019-07-30 NOTE — Progress Notes (Signed)
Pt. originally agreed to be placed on fetal monitoring, but after monitoring the baby for less than five minutes, the patient removed the monitors herself, claiming it was causing her too much abdominal pain. When RN investigated the removal of the monitors, the pt. was found to be sobbing and asking if she was going to die. RN calmed the pt. and assured her that we would take excellent care of her while she was with [place]. FHT obtained during 5-minute strip and found to be 131 @1533 . Provider aware of situation and refusal of pt. to be monitored.
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
## 2019-07-30 NOTE — Progress Notes (Signed)
HROB at 26 weeks 3 days: Now living at [facility], a state run maternity home in [place]. She started living there 28 July after staying overnight in [facility] when she presented with decreased fetal movement and then stated she was suicidal. She was evaluated by psychiatry and then seen by a case worker who arranged for an interview at the maternity home. She was then seen at [facility] hospital last night because she thought she was poisoned because she was coughing up pink stuff. It was thought that the pink stuff was from something she ate. She has eczema on her hands and abdomen and feet. She is washing her hands constantly, sometimes leaving the soap on her hands and then covering her hands with gloves. There are fissures on her hands and some drainage coming from those areas. Baby is moving. A: Exzema with infection on her hands Schizoaffective disorder-with paranoia  IUP at 26 wk 3 days with growth in [place]  P: Has appointment with [facility] on Monday for ultrasound and consult Keflex 500 mgm tid for skin infection Kenalog for exzema Recommend washing and rinsing and drying hands, then applying kenalog or a moisturizer ROB in 2 weeks for 28 week labs  Dalia Heading, CNM

## 2019-07-31 DIAGNOSIS — F4310 Post-traumatic stress disorder, unspecified: Secondary | ICD-10-CM | POA: Diagnosis not present

## 2019-07-31 DIAGNOSIS — Z590 Homelessness: Secondary | ICD-10-CM | POA: Diagnosis not present

## 2019-07-31 DIAGNOSIS — F603 Borderline personality disorder: Secondary | ICD-10-CM | POA: Diagnosis not present

## 2019-07-31 LAB — SARS CORONAVIRUS 2 BY RT PCR (HOSPITAL ORDER, PERFORMED IN ~~LOC~~ HOSPITAL LAB): SARS Coronavirus 2: NEGATIVE

## 2019-07-31 LAB — URINE DRUG SCREEN, QUALITATIVE (ARMC ONLY)
Amphetamines, Ur Screen: NOT DETECTED
Barbiturates, Ur Screen: NOT DETECTED
Benzodiazepine, Ur Scrn: NOT DETECTED
Cannabinoid 50 Ng, Ur ~~LOC~~: NOT DETECTED
Cocaine Metabolite,Ur ~~LOC~~: NOT DETECTED
MDMA (Ecstasy)Ur Screen: NOT DETECTED
Methadone Scn, Ur: NOT DETECTED
Opiate, Ur Screen: NOT DETECTED
Phencyclidine (PCP) Ur S: NOT DETECTED
Tricyclic, Ur Screen: NOT DETECTED

## 2019-07-31 NOTE — Progress Notes (Signed)
   Subjective:  Patient doing well, still occasional round ligament pain.  Much calmer.  Met with psychiatry today who deems patient stable for discharge from a psychiatric standpoint as she poses no harm to herself or others.  Objective:   Vitals: Blood pressure 108/62, pulse 68, temperature 98.7 F (37.1 C), temperature source Oral, resp. rate 16, height _0  (1.676 m), weight 68 kg, SpO2 99 %. General: NAD Abdomen: gravid, non-tender Pulmonary: no increased work of breathing  Results for orders placed or performed during the hospital encounter of 07/30/19 (from the past 24 hour(s))  Urine Drug Screen, Qualitative ([place] only)     Status: None   Collection Time: 07/31/19 12:08 AM  Result Value Ref Range   Tricyclic, Ur Screen NONE DETECTED NONE DETECTED   Amphetamines, Ur Screen NONE DETECTED NONE DETECTED   MDMA (Ecstasy)Ur Screen NONE DETECTED NONE DETECTED   Cocaine Metabolite,Ur [place] NONE DETECTED NONE DETECTED   Opiate, Ur Screen NONE DETECTED NONE DETECTED   Phencyclidine (PCP) Ur S NONE DETECTED NONE DETECTED   Cannabinoid 50 Ng, Ur  NONE DETECTED NONE DETECTED   Barbiturates, Ur Screen NONE DETECTED NONE DETECTED   Benzodiazepine, Ur Scrn NONE DETECTED NONE DETECTED   Methadone Scn, Ur NONE DETECTED NONE DETECTED  SARS Coronavirus 2 [facility] order, Performed in [place] hospital lab) Nasopharyngeal Nasopharyngeal Swab     Status: None   Collection Time: 07/31/19 10:43 AM   Specimen: Nasopharyngeal Swab  Result Value Ref Range   SARS Coronavirus 2 NEGATIVE NEGATIVE    Assessment:   28 y.o. [id number] with abdominal pain, currently working on disposition  Plan:   1) Homeless patient [place] [facility] [place]  One of her issues is she does not want them taking her social security.  The only other option the patient has per social work is homeless shelter.  She will need transportation to shelter [facility] as the exact nature of her leaving are unknown and I was unable to reach anyone over the weekend uncertain if they would accept her back.  2) Fetus - patient has a ultrasound to follow up on clubbed feet noted on fetus.  Will keep overnight to assure patient can get MFM consultation tomorrow.    AMalachy Mood MD, FTroyOB/GYN, [facility] 07/31/2019, 6:39 PM

## 2019-07-31 NOTE — OB Triage Note (Signed)
Pt Sleeping

## 2019-07-31 NOTE — Progress Notes (Signed)
Spoke with [facility] regarding placement for patient, care manager will come up and see patient to help with placement.
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
## 2019-07-31 NOTE — Consult Note (Addendum)
[facility]   Reason for Consult:  Suicidal ideations Referring Physician:  Dr Bonney AidStaebler Patient Identification: Bridget Carpenter MRN:  [id number] Principal Diagnosis: PTSD with anxiety Diagnosis:  Active Problems:   Labor and delivery indication for care or intervention  Total Time spent with patient: one hour  Subjective:   Bridget Carpenter is a 28 y.o. female patient reports that she came to the hospital to get evaluated for pregnancy.  She then states that she needs a place to stay.  She states that she is pregnant and she is homeless and she has no resources.  She reports that she did not go to the group home in the past because they want to take her money. Denies suicidal/homicidal ideations, hallucinations, or recent substance abuse.  HPI: Patient is a 28 year old female that is [redacted] weeks pregnant.  Patient presented to the ED yesterday requesting monitoring for her baby's movements and FHT indicated a HR of 131.  Then, she refused to be monitored.  At some point she was admitted to the maternity obs unit.  On assessment today, she denies suicidal/homicidal ideations, hallucinations, and substance abuse.  Denies taking any psychiatric medications or wanting any, "I'm pregnant."  Insight into being safe for her baby.  She wants a place to live but does not want to surrender her SSI check per previous notes.  Care management involved.  Per notes by a provided on 07/26/19: Patient is seen by this provider.  Face-to-face with social work.  Patient frequents the ED with suicidal ideations and seeking housing.  Patient is manipulative and attempts to use the hospital for secondary gain.  Patient does not report any direct suicide attempt or intention and does not detail any kind of plan.  Patient is also requesting resources to go to another town and to get assistance somewhere other than in [place].  She continues to go back and forth about which resources she wants  to use.  It is came to an agreement that we will have social work assist the patient with possible transportation for public last visit as well as seeing if she can be accepted back to her group home again.  At this time the patient does not meet inpatient criteria and is psychiatric cleared.  This was discussed with the nurse midwife on the floor.  Past Psychiatric History: Psychosis, substance abuse  Risk to Self: no Risk to Others:  none Prior Inpatient Therapy:  yes Prior Outpatient Therapy:  yes  Past Medical History:  Past Medical History:  Diagnosis Date  . Asthma   . Depression 2009   Inpatient psych admission for SI, dissociative fugue  . Dissociative disorder or reaction 2009  . Eczema   . H/O: suicide attempt   . ODD (oppositional defiant disorder)   . PTSD (post-traumatic stress disorder)   . Schizoaffective disorder (HCC)   . Substance abuse [facility](HCC)     Past Surgical History:  Procedure Laterality Date  . ADENOIDECTOMY    . TONSILLECTOMY     Family History:  Family History  Adopted: Yes  Problem Relation Age of Onset  . Mental illness Mother   . Mental illness Father   . Heart disease Maternal Grandmother   . Sickle cell trait Adoptive Mother   . Cleft palate Cousin        maternal side   Family Psychiatric  History: See abive Social History:  Social History   Substance and Sexual Activity  Alcohol Use Not Currently  .  Alcohol/week: 1.0 standard drinks  . Types: 1 Cans of beer per week   Comment: weekly     Social History   Substance and Sexual Activity  Drug Use Not Currently  . Types: Marijuana, "Crack" cocaine, Heroin   Comment: 4 grams daily    Social History   Socioeconomic History  . Marital status: Single    Spouse name: Not on file  . Number of children: 1  . Years of education: Not on file  . Highest education level: Not on file  Occupational History  . Not on file  Social Needs  . Financial resource strain: Very hard  . Food  insecurity    Worry: Often true    Inability: Often true  . Transportation needs    Medical: No    Non-medical: No  Tobacco Use  . Smoking status: Current Every Day Smoker    Packs/day: 0.75    Years: 2.00    Pack years: 1.50    Types: Cigarettes  . Smokeless tobacco: Never Used  Substance and Sexual Activity  . Alcohol use: Not Currently    Alcohol/week: 1.0 standard drinks    Types: 1 Cans of beer per week    Comment: weekly  . Drug use: Not Currently    Types: Marijuana, "Crack" cocaine, Heroin    Comment: 4 grams daily  . Sexual activity: Not Currently    Partners: Male  Lifestyle  . Physical activity    Days per week: 4 days    Minutes per session: 40 min  . Stress: Very much  Relationships  . Social Musicianconnections    Talks on phone: Never    Gets together: Never    Attends religious service: Never    Active member of club or organization: No    Attends meetings of clubs or organizations: Never    Relationship status: Never married  Other Topics Concern  . Not on file  Social History Narrative   Is homeless   Her mother is her daughter's gaurdian   Multiple psychiatric hospitalizations   Substance use disorder   Additional Social History:    Allergies:   Allergies  Allergen Reactions  . Banana Hives and Swelling  . Grapefruit Extract Itching and Swelling  . Cantaloupe (Diagnostic) Hives    Labs:  Results for orders placed or performed during the hospital encounter of 07/30/19 (from the past 48 hour(s))  Urine Drug Screen, Qualitative ([facility] only)     Status: None   Collection Time: 07/31/19 12:08 AM  Result Value Ref Range   Tricyclic, Ur Screen NONE DETECTED NONE DETECTED   Amphetamines, Ur Screen NONE DETECTED NONE DETECTED   MDMA (Ecstasy)Ur Screen NONE DETECTED NONE DETECTED   Cocaine Metabolite,Ur [place] NONE DETECTED NONE DETECTED   Opiate, Ur Screen NONE DETECTED NONE DETECTED   Phencyclidine (PCP) Ur S NONE DETECTED NONE DETECTED   Cannabinoid 50  Ng, Ur [place] NONE DETECTED NONE DETECTED   Barbiturates, Ur Screen NONE DETECTED NONE DETECTED   Benzodiazepine, Ur Scrn NONE DETECTED NONE DETECTED   Methadone Scn, Ur NONE DETECTED NONE DETECTED    Comment: (NOTE) Tricyclics + metabolites, urine    Cutoff 1000 ng/mL Amphetamines + metabolites, urine  Cutoff 1000 ng/mL MDMA (Ecstasy), urine              Cutoff 500 ng/mL Cocaine Metabolite, urine          Cutoff 300 ng/mL Opiate + metabolites, urine  Cutoff 300 ng/mL Phencyclidine (PCP), urine         Cutoff 25 ng/mL Cannabinoid, urine                 Cutoff 50 ng/mL Barbiturates + metabolites, urine  Cutoff 200 ng/mL Benzodiazepine, urine              Cutoff 200 ng/mL Methadone, urine                   Cutoff 300 ng/mL The urine drug screen provides only a preliminary, unconfirmed analytical test result and should not be used for non-medical purposes. Clinical consideration and professional judgment should be applied to any positive drug screen result due to possible interfering substances. A more specific alternate chemical method must be used in order to obtain a confirmed analytical result. Gas chromatography / mass spectrometry (GC/MS) is the preferred confirmat ory method. Performed at [facility], [address] [place], [place] [address]     Current Facility-Administered Medications  Medication Dose Route Frequency Provider Last Rate Last Dose  . acetaminophen (TYLENOL) tablet 650 mg  650 mg Oral Q6H PRN Vena AustriaStaebler, Andreas, MD   650 mg at 07/30/19 1705    Musculoskeletal: Strength & Muscle Tone: within normal limits Gait & Station: normal Patient leans: N/A  Psychiatric Specialty Exam: Physical Exam  Nursing note and vitals reviewed. Constitutional: She is oriented to person, place, and time. She appears well-developed and well-nourished.  HENT:  Head: Normocephalic.  Neck: Normal range of motion.  Cardiovascular: Normal rate and normal heart  sounds.  Respiratory: Effort normal.  Musculoskeletal: Normal range of motion.  Neurological: She is alert and oriented to person, place, and time.  Psychiatric: Her speech is normal and behavior is normal. Judgment and thought content normal. Her mood appears anxious. Her affect is blunt. Cognition and memory are normal.    Review of Systems  Constitutional: Positive for malaise/fatigue.  HENT: Negative.   Eyes: Negative.   Respiratory: Negative.   Cardiovascular: Negative.   Gastrointestinal: Negative.   Genitourinary: Negative.   Musculoskeletal: Negative.   Skin: Negative.   Neurological: Negative.   Endo/Heme/Allergies: Negative.   Psychiatric/Behavioral: The patient is nervous/anxious.     Blood pressure (!) 134/58, pulse 79, temperature 98.7 F (37.1 C), temperature source Oral, resp. rate 16, height 5\' 6"  (1.676 m), weight 68 kg.Body mass index is 24.21 kg/m.  General Appearance: Casual  Eye Contact:  Good  Speech:  Clear and Coherent and Normal Rate  Volume:  Normal  Mood:  Anxious, mild  Affect:  blunt  Thought Process:  Coherent and Descriptions of Associations: Intact  Orientation:  Full (Time, Place, and Person)  Thought Content:  WDL and Logical  Suicidal Thoughts:  No  Homicidal Thoughts:  No  Memory:  Immediate;   Good Recent;   Good Remote;   Good  Judgement:  Good  Insight:  Fair  Psychomotor Activity:  Normal  Concentration:  Concentration: Good  Recall:  Good  Fund of Knowledge:  Good  Language:  Good  Akathisia:  No  Handed:  Right  AIMS (if indicated):     Assets:  Communication Skills Desire for Improvement Financial Resources/Insurance Physical Health Resilience  ADL's:  Intact  Cognition:  WNL  Sleep:        Treatment Plan Summary: PTSD with anxiety: -Follow up with [facility] for outpatient therapy -Not interested in medications, does not want it to affect her baby Social work consult for housing  and transportation  Disposition: No  evidence of imminent risk to self or others at present.   Patient does not meet criteria for psychiatric inpatient admission. Supportive therapy provided about ongoing stressors. Discussed crisis plan, support from social network, calling 911, coming to the Emergency Department, and calling Suicide Hotline.  Waylan Boga, NP 07/31/2019 10:33 AM

## 2019-07-31 NOTE — Plan of Care (Signed)
  Problem: Education: Goal: Knowledge of General Education information will improve Description: Including pain rating scale, medication(s)/side effects and non-pharmacologic comfort measures Outcome: Progressing   Problem: Health Behavior/Discharge Planning: Goal: Ability to manage health-related needs will improve Outcome: Progressing   Problem: Clinical Measurements: Goal: Ability to maintain clinical measurements within normal limits will improve Outcome: Progressing   Problem: Clinical Measurements: Goal: Will remain free from infection Outcome: Progressing   Problem: Coping: Goal: Level of anxiety will decrease Outcome: Progressing   

## 2019-08-01 ENCOUNTER — Ambulatory Visit
Admission: RE | Admit: 2019-08-01 | Discharge: 2019-08-01 | Disposition: A | Discharge status: Home / Self Care | Payer: Medicaid Other | Source: Ambulatory Visit | Attending: Obstetrics and Gynecology | Admitting: Obstetrics and Gynecology

## 2019-08-01 ENCOUNTER — Ambulatory Visit (HOSPITAL_BASED_OUTPATIENT_CLINIC_OR_DEPARTMENT_OTHER)
Admission: RE | Admit: 2019-08-01 | Discharge: 2019-08-01 | Disposition: A | Discharge status: Home / Self Care | Payer: Medicaid Other | Source: Ambulatory Visit | Attending: Certified Nurse Midwife | Admitting: Certified Nurse Midwife

## 2019-08-01 ENCOUNTER — Inpatient Hospital Stay (HOSPITAL_COMMUNITY): Admit: 2019-08-01 | Discharge: 2019-08-01 | Disposition: A | Discharge status: Home / Self Care | Payer: Medicaid Other

## 2019-08-01 DIAGNOSIS — Z91018 Allergy to other foods: Secondary | ICD-10-CM | POA: Diagnosis not present

## 2019-08-01 DIAGNOSIS — Z5900 Homelessness unspecified: Secondary | ICD-10-CM

## 2019-08-01 DIAGNOSIS — R45851 Suicidal ideations: Secondary | ICD-10-CM | POA: Diagnosis present

## 2019-08-01 DIAGNOSIS — F259 Schizoaffective disorder, unspecified: Secondary | ICD-10-CM | POA: Insufficient documentation

## 2019-08-01 DIAGNOSIS — R109 Unspecified abdominal pain: Secondary | ICD-10-CM | POA: Diagnosis present

## 2019-08-01 DIAGNOSIS — O09899 Supervision of other high risk pregnancies, unspecified trimester: Secondary | ICD-10-CM

## 2019-08-01 DIAGNOSIS — O99322 Drug use complicating pregnancy, second trimester: Secondary | ICD-10-CM

## 2019-08-01 DIAGNOSIS — F17210 Nicotine dependence, cigarettes, uncomplicated: Secondary | ICD-10-CM | POA: Diagnosis present

## 2019-08-01 DIAGNOSIS — F4310 Post-traumatic stress disorder, unspecified: Secondary | ICD-10-CM | POA: Diagnosis present

## 2019-08-01 DIAGNOSIS — O99342 Other mental disorders complicating pregnancy, second trimester: Secondary | ICD-10-CM | POA: Diagnosis present

## 2019-08-01 DIAGNOSIS — Z283 Underimmunization status: Secondary | ICD-10-CM

## 2019-08-01 DIAGNOSIS — O99332 Smoking (tobacco) complicating pregnancy, second trimester: Secondary | ICD-10-CM | POA: Insufficient documentation

## 2019-08-01 DIAGNOSIS — F603 Borderline personality disorder: Secondary | ICD-10-CM

## 2019-08-01 DIAGNOSIS — Z3A26 26 weeks gestation of pregnancy: Secondary | ICD-10-CM | POA: Insufficient documentation

## 2019-08-01 DIAGNOSIS — O321XX0 Maternal care for breech presentation, not applicable or unspecified: Secondary | ICD-10-CM | POA: Diagnosis not present

## 2019-08-01 DIAGNOSIS — O9989 Other specified diseases and conditions complicating pregnancy, childbirth and the puerperium: Secondary | ICD-10-CM

## 2019-08-01 DIAGNOSIS — O0932 Supervision of pregnancy with insufficient antenatal care, second trimester: Secondary | ICD-10-CM | POA: Diagnosis not present

## 2019-08-01 DIAGNOSIS — O0990 Supervision of high risk pregnancy, unspecified, unspecified trimester: Secondary | ICD-10-CM

## 2019-08-01 DIAGNOSIS — O26899 Other specified pregnancy related conditions, unspecified trimester: Secondary | ICD-10-CM

## 2019-08-01 DIAGNOSIS — O35HXX0 Maternal care for other (suspected) fetal abnormality and damage, fetal lower extremities anomalies, not applicable or unspecified: Secondary | ICD-10-CM

## 2019-08-01 DIAGNOSIS — O358XX0 Maternal care for other (suspected) fetal abnormality and damage, not applicable or unspecified: Secondary | ICD-10-CM

## 2019-08-01 DIAGNOSIS — Z20828 Contact with and (suspected) exposure to other viral communicable diseases: Secondary | ICD-10-CM | POA: Diagnosis present

## 2019-08-01 DIAGNOSIS — O26892 Other specified pregnancy related conditions, second trimester: Secondary | ICD-10-CM | POA: Diagnosis present

## 2019-08-01 DIAGNOSIS — Z2839 Other underimmunization status: Secondary | ICD-10-CM | POA: Insufficient documentation

## 2019-08-01 MED ORDER — TRIAMCINOLONE ACETONIDE 0.1 % EX OINT: TOPICAL_OINTMENT | Freq: Three times a day (TID) | CUTANEOUS | 0 refills | Status: DC

## 2019-08-01 MED ORDER — CEPHALEXIN 500 MG PO CAPS: 500.0000 mg | ORAL_CAPSULE | Freq: Two times a day (BID) | ORAL | 0 refills | Status: DC

## 2019-08-01 MED ORDER — TRIAMCINOLONE ACETONIDE 0.1 % EX OINT
TOPICAL_OINTMENT | Freq: Three times a day (TID) | CUTANEOUS | Status: DC
  Administered 2019-08-01: 10:00:00 via TOPICAL
  Filled 2019-08-01: qty 15

## 2019-08-01 MED ORDER — CEPHALEXIN 500 MG PO CAPS
500.0000 mg | ORAL_CAPSULE | Freq: Two times a day (BID) | ORAL | Status: DC
  Administered 2019-08-01: 500 mg via ORAL
  Filled 2019-08-01 (×3): qty 1

## 2019-08-01 NOTE — Progress Notes (Signed)
Pt provided taxi voucher to homeless shelter on [address] in [place]. Pt states she does not want to go there I will eat my dinner then leave on my own. Pt verbalized understanding she has no medical necessity to stay in the hospital.

## 2019-08-01 NOTE — Progress Notes (Signed)
Pt discharge instructions provided, medications given to patient, taxi cab voucher given to patient. Pt verbalized understanding of discharge instructions. All belongings taken with patient. Pt escorted out by RN and security.
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
## 2019-08-01 NOTE — Progress Notes (Signed)
Madaline Savage [facility] has remained in room with patient since verbalizing she wanted to kill herself.   Colleen CNM on unit. Loletha Grayer is Hydrographic surveyor aware of situation and advised plan for discharge since psych has cleared patient x3 in the last 3 days (see note from Waylan Boga NP).   [place] Mow taxi cab called. Pt will be discharge this evening and provided free ride to homeless shelter- patient can then decide if she would like to stay or find another place.

## 2019-08-01 NOTE — Progress Notes (Signed)
Colleen CNM notified if patient's increasing paranoia and having conversations with herself in her room. Jaclyn Shaggy stated to call psych who asked to have a new consult placed and will then see patient. New consult placed by midwife.

## 2019-08-01 NOTE — Discharge Instructions (Signed)
Social worker will call you once they have heard from [place]

## 2019-08-01 NOTE — Progress Notes (Signed)
CSW notified of probable pt discharge this afternoon. MFM consult at 1300 and pt needs help obtaining medications prior to discharge.

## 2019-08-01 NOTE — Progress Notes (Signed)
Clinical Education officer, museum (CSW) gave bedside RN patient's prescriptions keflex and triamcinoone cream filled at [facility]. CSW will continue to follow and assist as needed.   [employer], LCSW [phone]

## 2019-08-01 NOTE — Progress Notes (Signed)
Clinical Education officer, museum (CSW) contacted [facility] and spoke to staff member [place]. Per Sharee Pimple patient did an intake with [facility] 3 months ago and was declined. Per [facility] has not made a decision yet about accepting patient. CSW will follow up with [facility] tomorrow. Per RN patient is medically stable for D/C today. CSW will send RN taxi voucher to shelter in [place] for patient. CSW discussed case with TOC lead and Surveyor, quantity.   [employer], LCSW [phone]
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
## 2019-08-01 NOTE — Progress Notes (Signed)
Pt had appt today at [facility] [place] @ 1300.  RN to room 344 to get pt for transport to [facility] via wheelchair.  Pt was in the shower on arrival but got ready and at appt at 1320.
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
## 2019-08-01 NOTE — Progress Notes (Signed)
Pt just arrived back to [facility] from [facility] appointment. Gave patient prescriptions filled at [facility] by CSW and instructed patient to not use until she is discharged. Medications will be provided while she is a patient in the hospital.

## 2019-08-01 NOTE — Progress Notes (Signed)
Clinical Education officer, museum (CSW) met with patient to discuss D/C plan and provide resources. Patient reported that she did not like Room at [facility] and stated that "the place is fake." Patient reported that Room at [facility] took down all her information and appeared concerned about that. CSW explained that the only emergency housing is the shelter. Patient refused to go to the shelter and asked for another Education officer, museum. CSW provided emotional support. Patient reported that she wants to go [facility] a residential substance abuse treatment center for pregnant women. CSW contacted [facility] and patient completed an intake screen via telephone with Allison Quarry at [facility]. Per [facility] will follow up with patient and CSW. CSW will continue to follow and assist as needed.   [employer], LCSW [phone]

## 2019-08-01 NOTE — Progress Notes (Signed)
Contacted [facility] per Dr. Richmond Campbell request at [facility] in regards to this pt and Plan of Care with her Case Worker.  Spoke with Lorayne Bender (Supervisor of Case Management @ [facility]) who will be assuming the case for this pt.  Angie made aware that pt in inpatient right now at [facility] and number given to her for coordination of care with her nurse right now since she is difficult to get in touch with because of her current housing situation.

## 2019-08-01 NOTE — Progress Notes (Signed)
Clinical Education officer, museum (CSW) contacted Ambulance person member at Room at [facility] in [place] this morning. Per Hassan Rowan patient was removed from the program because she would not follow the rules. Per [place] at [facility] gave her $80 to get a hotel room. Per Hassan Rowan patient can't return to Room at [facility] in [place]. Per RN patient has 2 prescriptions that she needs help getting. TOC lead has approved petty cash for the 2 prescriptions which will be $6 total under patient's medicaid. TOC assistant Earnest Beverlyn Mcginness has agreed to pick up patient's prescriptions at [facility] located at [address], [place] will provide patient with prescriptions and shelter list.   [employer], LCSW [phone]

## 2019-08-01 NOTE — Progress Notes (Signed)
Pt stated she wanted to kill herself. Attending MD notified and aware. Told to contact psych. Voicemail left with psych. Madaline Savage, [place] at bedside. Nursing supervisor notified.

## 2019-08-01 NOTE — Progress Notes (Signed)
Patient has been very paranoid all morning. Pt told dining to not come in her room because she "doesn't trust her", showing staff her social media requests stating "I don't know these people they're out to get me", "fired" unit CSW stating "she needs a different Education officer, museum- she doesn't know how to help people"  Pt has been in her room talking and having a conversation with herself about how frustrated she is, stating "I keep saying God bless you to staff and they're doing nothing for me" "I need a crack pipe to go to [facility]" "I don't have mental health" while sitting on her bed- no phone near her and no one else in the room.   This RN has spent multiple conversations with pt calming caring for her and explaining plan of care for today and resources available. Pt stated "this is not your job- get me another Education officer, museum that's more helpful"

## 2019-08-01 NOTE — Progress Notes (Signed)
Explained to patient taxi cab has been called and discharge paperwork completed. Pt stated "what if I said I want to kill myself?" this RN asked "Do you really?" and patient looked at this RN and started to pack her belongings and getting ready for discharge. Pt stated "I'm just frustrated"

## 2019-08-01 NOTE — Discharge Summary (Signed)
Physician Discharge Summary  Patient ID: Bridget Carpenter MRN: [id number] DOB/AGE: 28/01/1991 28 y.o.   Admit date: 07/30/2019 Admitting provider: Vena AustriaAndreas Staebler, MD Discharge date: 08/01/2019   Admission Diagnoses: Abdominal pain in pregnancy IUP at [redacted] weeks gestation Homelessness Psycho affective disorder Fetus with club feet, bilaterally  Discharge Diagnoses:  Principal Problem:  Pain of round ligament affecting pregnancy, antepartum Homelessness Active Problems: PTSD with anxiety   Borderline personality disorder (HCC) Fetus with clubfeet  Consults: psychiatry and case worker, MFM  Significant Findings/ Diagnostic Studies: Bridget [place] Shayan Hooveris a [age] y.63o.G3P1011 female with EDC=11/01/2019 at 4072w6d dated bya 12wk6d ultrasound. Her pregnancy has been significantly complicated byschizoaffective disorder, paranoia, and history of substance abuse. She has had multiple admissions for psychiatric problems and has had past suicidal attempts. Her prenatal care is also remarkable for late entry, tobacco use, and baby has club feet. She has been compliant with her prenatal care and states that she has stopped using cocaine since her initial prenatal visit 06/17/2019. She is not on any psychiatric medications. She is homeless. She had just gotten [facility] [place] last week, but she was told she could not stay 07/29/2019 because she did not follow the rules.  She presented to L&D 8/1 with abdominal pain that was diagnosed as round ligament pain. She was seen by psychiatry and case workers. She did not meet criteria for an inpatient psychiatric admission, and she did not want to be on psychiatric medications. Case worker looked into [facility] and paperwork was submitted, but decision was not made yet regarding acceptance. Her admission was declined there 3 months ago. The case worker did pick up her prescriptions  for Kenalog and Keflex for her  eczema and skin infection on her hands. She also had her [facility] consult and ultrasound. No other structural anomalies were seen other than the club feet. [facility] recommendations for antepartum testing, induction, and labs were noted.  Upon discharge, she was offered a taxi voucher to take her to the homeless shelter on [address], but she "won't stay there."     Procedures: none  Discharge Condition: stable  Disposition:  Homeless shelter There are no questions and answers to display.        Diet: Regular diet  Discharge Activity: Activity as tolerated        Allergies as of 08/01/2019      Reactions   Banana Hives, Swelling   Grapefruit Extract Itching, Swelling   Cantaloupe (diagnostic) Hives         Medication List    STOP taking these medications   triamcinolone cream 0.1 % Commonly known as: KENALOG Replaced by: triamcinolone ointment 0.1 %     TAKE these medications   cephALEXin 500 MG capsule Commonly known as: KEFLEX Take 1 capsule (500 mg total) by mouth every 12 (twelve) hours. What changed: when to take this   Prenate Mini 18-0.6-0.4-350 MG Caps Take 1 capsule by mouth daily.   triamcinolone ointment 0.1 % Commonly known as: KENALOG Apply topically 3 (three) times daily. Replaces: triamcinolone cream 0.1 %         Follow-up Information    Farrel ConnersGutierrez, Danille Oppedisano, CNM. Go on 08/17/2019.   Specialty: Certified Nurse Midwife Why: at 2:20 at [facility] Contact information: [address] [place] [id number] [phone]                   Farrel ConnersColleen Ishmail Mcmanamon, CNM 08/01/2019,  6:09 PM

## 2019-08-09 ENCOUNTER — Observation Stay
Admission: EM | Admit: 2019-08-09 | Discharge: 2019-08-09 | Disposition: A | Discharge status: Home / Self Care | Payer: Medicaid Other | Attending: Obstetrics & Gynecology | Admitting: Obstetrics & Gynecology

## 2019-08-09 ENCOUNTER — Other Ambulatory Visit: Payer: Self-pay

## 2019-08-09 DIAGNOSIS — F4310 Post-traumatic stress disorder, unspecified: Secondary | ICD-10-CM | POA: Insufficient documentation

## 2019-08-09 DIAGNOSIS — F913 Oppositional defiant disorder: Secondary | ICD-10-CM | POA: Insufficient documentation

## 2019-08-09 DIAGNOSIS — Z79899 Other long term (current) drug therapy: Secondary | ICD-10-CM | POA: Insufficient documentation

## 2019-08-09 DIAGNOSIS — Z5900 Homelessness unspecified: Secondary | ICD-10-CM

## 2019-08-09 DIAGNOSIS — F329 Major depressive disorder, single episode, unspecified: Secondary | ICD-10-CM | POA: Insufficient documentation

## 2019-08-09 DIAGNOSIS — Z9114 Patient's other noncompliance with medication regimen: Secondary | ICD-10-CM | POA: Diagnosis not present

## 2019-08-09 DIAGNOSIS — R21 Rash and other nonspecific skin eruption: Secondary | ICD-10-CM | POA: Insufficient documentation

## 2019-08-09 DIAGNOSIS — Z3A28 28 weeks gestation of pregnancy: Secondary | ICD-10-CM | POA: Insufficient documentation

## 2019-08-09 DIAGNOSIS — R109 Unspecified abdominal pain: Secondary | ICD-10-CM | POA: Diagnosis not present

## 2019-08-09 DIAGNOSIS — O99343 Other mental disorders complicating pregnancy, third trimester: Secondary | ICD-10-CM | POA: Insufficient documentation

## 2019-08-09 DIAGNOSIS — O368130 Decreased fetal movements, third trimester, not applicable or unspecified: Principal | ICD-10-CM | POA: Insufficient documentation

## 2019-08-09 DIAGNOSIS — F259 Schizoaffective disorder, unspecified: Secondary | ICD-10-CM | POA: Insufficient documentation

## 2019-08-09 DIAGNOSIS — F603 Borderline personality disorder: Secondary | ICD-10-CM | POA: Insufficient documentation

## 2019-08-09 MED ORDER — CEPHALEXIN 500 MG PO CAPS: 500.0000 mg | ORAL_CAPSULE | Freq: Four times a day (QID) | ORAL | 2 refills | Status: DC

## 2019-08-09 NOTE — OB Triage Note (Signed)
Difficulty continuously monitoring fetal heart tones with EFM due to patient continues to move monitor/ fetal size/movement. FHT obtained with doppler 125-130.

## 2019-08-09 NOTE — Discharge Summary (Signed)
  See FPN 

## 2019-08-09 NOTE — OB Triage Note (Signed)
Patient arrived in triage today with complaints of "my skin is infected" and abdominal pain that started "6 months ago." She is concerned because she is "very close to her due date and if the infection does not go away, she will not be able to hold her unborn baby." She reports her skin infection started last week and then said it was "a few years ago, maybe 3 or 5". Patient denies feeling fetal moving stating, "I never felt her move." Denies vaginal bleeding. Reports some clear discharge at times, but not right now. Denies abdominal pain that comes and goes. EFM applied. FHR in the 130's, explained to patient. Toco placed, patient states "I don't like that one." Explained to the patient that the [place] is necessary to monitor any uterine activity, especially since she is having abdominal pain. Patient again refused toco and insisted that I take it off. Patient continues to move EFM around after RN requests for her to leave it in place.

## 2019-08-09 NOTE — Final Progress Note (Signed)
Physician Final Progress Note  Patient ID: Bridget Carpenter MRN: [id number] DOB/AGE: 28/01/1991 28 y.o.  Admit date: 08/09/2019 Admitting provider: Nadara Mustardobert P Dewana Ammirati, MD Discharge date: 08/09/2019  Admission Diagnoses: Decreased fetal movement Skin rash/ infection  Discharge Diagnoses:  Same  Consults: None  Significant Findings/ Diagnostic Studies:  Obstetrics Admission History & Physical   Abdominal Pain   HPI:  28 y.o. G3P1011 @ 655w0d (11/01/2019, by Ultrasound). Admitted on 08/09/2019:   Patient Active Problem List   Diagnosis Date Noted  . Rubella non-immune status, antepartum 08/01/2019  . Pain of round ligament affecting pregnancy, antepartum 08/01/2019  . Labor and delivery indication for care or intervention 07/30/2019  . Homelessness 07/25/2019  . Decreased fetal movement 07/02/2019  . Club foot, fetal, affecting care of mother, antepartum 06/29/2019  . Supervision of high risk pregnancy, antepartum 06/19/2019  . Cocaine abuse with cocaine-induced mood disorder (HCC) 04/15/2019  . 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/03/2018  . Bacterial vaginosis 12/25/2017  . PTSD (post-traumatic stress disorder) 09/02/2017  . MRSA carrier 07/18/2017  . Overdose 07/16/2017  . Aspiration pneumonia (HCC) 07/16/2017  . Acute respiratory failure (HCC) 07/16/2017  . Polysubstance abuse (HCC) 07/16/2017  . Eczema 05/08/2017  . Borderline personality disorder (HCC) 11/06/2016  . Cocaine use disorder, severe, dependence (HCC) 11/06/2016  . Cannabis use disorder, moderate, dependence (HCC) 11/06/2016  . Alcohol use disorder, mild, abuse 11/06/2016  . Asthma 09/23/2011  . Tobacco use disorder 09/15/2009     Presents for decreased fetal movements today. Also skin continues to bother her.  She had Rx for keflex for cellulitis, but lost Rx.  Concerned about baby.  Known frequent visit on labor and delivery for pregnancy and psychiatric related concerns. Has seen [facility]  but has limited office based prenatal care.  Social situation concerning as well.  Reports no concerns tonight over homelessness, wellbeing, safety.    ROS: A review of systems was performed and negative, except as stated in the above HPI. PMHx:  Past Medical History:  Diagnosis Date  . Asthma   . Depression 2009   Inpatient psych admission for SI, dissociative fugue  . Dissociative disorder or reaction 2009  . Eczema   . H/O: suicide attempt   . ODD (oppositional defiant disorder)   . PTSD (post-traumatic stress disorder)   . Schizoaffective disorder (HCC)   . Substance abuse (HCC)    PSHx:  Past Surgical History:  Procedure Laterality Date  . ADENOIDECTOMY    . TONSILLECTOMY     Medications:  Medications Prior to Admission  Medication Sig Dispense Refill Last Dose  . cephALEXin (KEFLEX) 500 MG capsule Take 1 capsule (500 mg total) by mouth every 12 (twelve) hours. (Patient not taking: Reported on 08/09/2019) 20 capsule 0 Not Taking at Unknown time  . Prenat-FeCbn-FeAsp-Meth-FA-DHA (PRENATE MINI) 18-0.6-0.4-350 MG CAPS Take 1 capsule by mouth daily. (Patient not taking: Reported on 07/25/2019) 30 capsule 11 Not Taking at Unknown time  . triamcinolone ointment (KENALOG) 0.1 % Apply topically 3 (three) times daily. (Patient not taking: Reported on 08/09/2019) 30 g 0 Not Taking at Unknown time   Allergies: is allergic to banana; grapefruit extract; and cantaloupe (diagnostic). OBHx:  OB History  Gravida Para Term Preterm AB Living  3 1 1   1 1   SAB TAB Ectopic Multiple Live Births      1   1    # Outcome Date GA Lbr Len/2nd Weight Sex Delivery Anes PTL Lv  3 Current  2 Term 09/08/07   2693 g F Vag-Spont  N LIV  1 Ectopic              Birth Comments: treated with methotrexate   OZD:GUYQIHKV/QQVZDGLOVFIE except as detailed in HPI.Marland Kitchen  No family history of birth defects. Soc Hx: Alcohol: none and Recreational drug use: none  Objective:   Vitals:   08/09/19 2012  08/09/19 2025  BP: 110/61   Pulse: 71   Resp: 18   Temp: 98.7 F (37.1 C)   SpO2:  100%   Constitutional: Well nourished, well developed female in no acute distress.  HEENT: normal Skin: Warm and dry.  Cardiovascular:Regular rate and rhythm.   Extremity: trace to 1+ bilateral pedal edema Respiratory: Clear to auscultation bilateral. Normal respiratory effort Abdomen: gravid, ND, FHT present, without guarding, without rebound tenderness on exam Back: no CVAT Neuro: DTRs 2+, Cranial nerves grossly intact Psych: Alert and Oriented x3. No memory deficits. Normal mood and affect.  MS: normal gait, normal bilateral lower extremity ROM/strength/stability. FHT 140s, also by [place]  Assessment & Plan:   28 y.o. G3P1011 @ [redacted]w[redacted]d, Admitted on 08/09/2019:Decreased fetal movement    Procedures: [place]- +FHTs, +FM, Vtx  Plan to restart Keflex and also encourage skin hygiene techniques Monitor fetal movement and kick counts Keep prenatal appts (Aug 19 [facility], Aug 31 [facility])  Discharge Condition: good  Disposition: Discharge disposition: 01-Home or Self Care       Diet: Regular diet  Discharge Activity: Activity as tolerated  Discharge Instructions    Call MD for:   Complete by: As directed    Worsening contractions or pain; leakage of fluid; bleeding.   Diet general   Complete by: As directed    Increase activity slowly   Complete by: As directed      Allergies as of 08/09/2019      Reactions   Banana Hives, Swelling   Grapefruit Extract Itching, Swelling   Cantaloupe (diagnostic) Hives      Medication List    TAKE these medications   cephALEXin 500 MG capsule Commonly known as: KEFLEX Take 1 capsule (500 mg total) by mouth 4 (four) times daily. What changed: when to take this   Prenate Mini 18-0.6-0.4-350 MG Caps Take 1 capsule by mouth daily.   triamcinolone ointment 0.1 % Commonly known as: KENALOG Apply topically 3 (three) times daily.        Total time spent  taking care of this patient: 15 minutes  Signed: Hoyt Koch 08/09/2019, 11:03 PM

## 2019-08-09 NOTE — OB Triage Note (Signed)
Report given to Dr. Kenton Kingfisher regarding patient's arrival in triage, complaints as listed in previous note, and assessment findings. Dr. Kenton Kingfisher familiar with patient's background. Notified that FHR found with EFM, but difficult to continuously monitor. FHR also found with doppler. Patient was not satisfied with this and requested to see heart rate on u/s. Dr. Kenton Kingfisher made aware. Dr. Kenton Kingfisher to evaluate patient, ok to give regular diet. No further orders at this time.

## 2019-08-14 ENCOUNTER — Encounter: Payer: Self-pay | Admitting: *Deleted

## 2019-08-14 ENCOUNTER — Other Ambulatory Visit: Payer: Self-pay

## 2019-08-14 ENCOUNTER — Observation Stay
Admission: EM | Admit: 2019-08-14 | Discharge: 2019-08-14 | Disposition: A | Discharge status: Home / Self Care | Payer: Medicaid Other | Attending: Obstetrics & Gynecology | Admitting: Obstetrics & Gynecology

## 2019-08-14 DIAGNOSIS — O99713 Diseases of the skin and subcutaneous tissue complicating pregnancy, third trimester: Secondary | ICD-10-CM | POA: Diagnosis not present

## 2019-08-14 DIAGNOSIS — Z3A28 28 weeks gestation of pregnancy: Secondary | ICD-10-CM | POA: Insufficient documentation

## 2019-08-14 DIAGNOSIS — E860 Dehydration: Secondary | ICD-10-CM | POA: Insufficient documentation

## 2019-08-14 DIAGNOSIS — L309 Dermatitis, unspecified: Secondary | ICD-10-CM | POA: Diagnosis not present

## 2019-08-14 DIAGNOSIS — O26893 Other specified pregnancy related conditions, third trimester: Principal | ICD-10-CM | POA: Insufficient documentation

## 2019-08-14 DIAGNOSIS — Z5900 Homelessness unspecified: Secondary | ICD-10-CM

## 2019-08-14 LAB — URINALYSIS, ROUTINE W REFLEX MICROSCOPIC
Bilirubin Urine: NEGATIVE
Glucose, UA: NEGATIVE mg/dL
Hgb urine dipstick: NEGATIVE
Ketones, ur: 20 mg/dL — AB
Leukocytes,Ua: NEGATIVE
Nitrite: NEGATIVE
Protein, ur: NEGATIVE mg/dL
Specific Gravity, Urine: 1.019 (ref 1.005–1.030)
pH: 6 (ref 5.0–8.0)

## 2019-08-14 MED ORDER — CEPHALEXIN 500 MG PO CAPS: 500.0000 mg | ORAL_CAPSULE | Freq: Four times a day (QID) | ORAL | 2 refills | Status: DC

## 2019-08-14 MED ORDER — ACETAMINOPHEN 325 MG PO TABS: 650.0000 mg | ORAL_TABLET | ORAL | Status: DC | PRN

## 2019-08-14 MED ORDER — ONDANSETRON HCL 4 MG/2ML IJ SOLN: 4.0000 mg | Freq: Four times a day (QID) | INTRAMUSCULAR | Status: DC | PRN

## 2019-08-14 MED ORDER — LIDOCAINE HCL (PF) 1 % IJ SOLN: 30.0000 mL | INTRAMUSCULAR | Status: DC | PRN

## 2019-08-14 MED ORDER — LACTATED RINGERS IV SOLN: INTRAVENOUS | Status: DC

## 2019-08-14 MED ORDER — CEPHALEXIN 500 MG PO CAPS
500.0000 mg | ORAL_CAPSULE | Freq: Four times a day (QID) | ORAL | Status: DC
  Filled 2019-08-14: qty 1

## 2019-08-14 MED ORDER — LACTATED RINGERS IV BOLUS
1000.0000 mL | Freq: Once | INTRAVENOUS | Status: AC
  Administered 2019-08-14: 18:00:00 1000 mL via INTRAVENOUS

## 2019-08-14 NOTE — Progress Notes (Signed)
Pt given discharge packet. Discussed with the patient that she has an OB appt scheduled this Wednesday. Pt instructed to pick up prescription from pharmacy and informed on how to take. Pts questions were answered and pt verbalized understanding. Pt had no complaints or concerns at time of discharge. Pt taken via wheelchair to the ER where her mother was picking her up.

## 2019-08-14 NOTE — Final Progress Note (Signed)
Physician Final Progress Note  Patient ID: Bridget Carpenter MRN: [id number] DOB/AGE: [birth year] 28 y.o.  Admit date: 08/14/2019 Admitting provider: Gae Dry, MD Discharge date: 08/14/2019  Admission Diagnoses:  Dehydration Skin rash (ezxema) Pregnancy 28 weeks  Discharge Diagnoses:  Active Problems:   Dehydration   Same  Consults: None  Significant Findings/ Diagnostic Studies: Patient presented for evaluation of labor.  Patient had cervical exam by RN and this was reported to me. I reviewed her vital signs and fetal tracing, both of which were reassuring.  Patient was discharge as she was not laboring. IVF for dehydration UA normal Results for orders placed or performed during the hospital encounter of 08/14/19  Urinalysis, Routine w reflex microscopic  Result Value Ref Range   Color, Urine YELLOW (A) YELLOW   APPearance CLEAR (A) CLEAR   Specific Gravity, Urine 1.019 1.005 - 1.030   pH 6.0 5.0 - 8.0   Glucose, UA NEGATIVE NEGATIVE mg/dL   Hgb urine dipstick NEGATIVE NEGATIVE   Bilirubin Urine NEGATIVE NEGATIVE   Ketones, ur 20 (A) NEGATIVE mg/dL   Protein, ur NEGATIVE NEGATIVE mg/dL   Nitrite NEGATIVE NEGATIVE   Leukocytes,Ua NEGATIVE NEGATIVE   Procedures: A NST procedure was performed with FHR monitoring and a normal baseline established, appropriate time of 20-40 minutes of evaluation, and accels >2 seen w 15x15 characteristics.  Results show a REACTIVE NST.   Discharge Condition: good  Disposition: Discharge disposition: 01-Home or Self Care       Diet: Regular diet  Discharge Activity: Activity as tolerated  Discharge Instructions    Call MD for:   Complete by: As directed    Worsening contractions or pain; leakage of fluid; bleeding.   Diet general   Complete by: As directed    Increase activity slowly   Complete by: As directed      Allergies as of 08/14/2019      Reactions   Banana Hives, Swelling   Grapefruit Extract Itching,  Swelling   Cantaloupe (diagnostic) Hives      Medication List    TAKE these medications   cephALEXin 500 MG capsule Commonly known as: KEFLEX Take 1 capsule (500 mg total) by mouth 4 (four) times daily.   Prenate Mini 18-0.6-0.4-350 MG Caps Take 1 capsule by mouth daily.   triamcinolone ointment 0.1 % Commonly known as: KENALOG Apply topically 3 (three) times daily.      [place] Follow up.   Why: As schdduled Contact information: [address] [place] [phone]           Total time spent taking care of this patient: TRIAGE  Signed: Hoyt Koch 08/14/2019, 8:11 PM

## 2019-08-14 NOTE — Discharge Summary (Signed)
  See FPN 
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
## 2019-08-14 NOTE — Discharge Instructions (Signed)
Dehydration, Adult  Dehydration is when there is not enough fluid or water in your body. This happens when you lose more fluids than you take in. Dehydration can range from mild to very bad. It should be treated right away to keep it from getting very bad. Symptoms of mild dehydration may include:  Thirst.  Dry lips.  Slightly dry mouth.  Dry, warm skin.  Dizziness. Symptoms of moderate dehydration may include:  Very dry mouth.  Muscle cramps.  Dark pee (urine). Pee may be the color of tea.  Your body making less pee.  Your eyes making fewer tears.  Heartbeat that is uneven or faster than normal (palpitations).  Headache.  Light-headedness, especially when you stand up from sitting.  Fainting (syncope). Symptoms of very bad dehydration may include:  Changes in skin, such as: ? Cold and clammy skin. ? Blotchy (mottled) or pale skin. ? Skin that does not quickly return to normal after being lightly pinched and let go (poor skin turgor).  Changes in body fluids, such as: ? Feeling very thirsty. ? Your eyes making fewer tears. ? Not sweating when body temperature is high, such as in hot weather. ? Your body making very little pee.  Changes in vital signs, such as: ? Weak pulse. ? Pulse that is more than 100 beats a minute when you are sitting still. ? Fast breathing. ? Low blood pressure.  Other changes, such as: ? Sunken eyes. ? Cold hands and feet. ? Confusion. ? Lack of energy (lethargy). ? Trouble waking up from sleep. ? Short-term weight loss. ? Unconsciousness. Follow these instructions at home:   If told by your doctor, drink an ORS: ? Make an ORS by using instructions on the package. ? Start by drinking small amounts, about  cup (120 mL) every 5-10 minutes. ? Slowly drink more until you have had the amount that your doctor said to have.  Drink enough clear fluid to keep your pee clear or pale yellow. If you were told to drink an ORS, finish the  ORS first, then start slowly drinking clear fluids. Drink fluids such as: ? Water. Do not drink only water by itself. Doing that can make the salt (sodium) level in your body get too low (hyponatremia). ? Ice chips. ? Fruit juice that you have added water to (diluted). ? Low-calorie sports drinks.  Avoid: ? Alcohol. ? Drinks that have a lot of sugar. These include high-calorie sports drinks, fruit juice that does not have water added, and soda. ? Caffeine. ? Foods that are greasy or have a lot of fat or sugar.  Take over-the-counter and prescription medicines only as told by your doctor.  Do not take salt tablets. Doing that can make the salt level in your body get too high (hypernatremia).  Eat foods that have minerals (electrolytes). Examples include bananas, oranges, potatoes, tomatoes, and spinach.  Keep all follow-up visits as told by your doctor. This is important. Contact a doctor if:  You have belly (abdominal) pain that: ? Gets worse. ? Stays in one area (localizes).  You have a rash.  You have a stiff neck.  You get angry or annoyed more easily than normal (irritability).  You are more sleepy than normal.  You have a harder time waking up than normal.  You feel: ? Weak. ? Dizzy. ? Very thirsty.  You have peed (urinated) only a small amount of very dark pee during 6-8 hours. Get help right away if:  You have   symptoms of very bad dehydration.  You cannot drink fluids without throwing up (vomiting).  Your symptoms get worse with treatment.  You have a fever.  You have a very bad headache.  You are throwing up or having watery poop (diarrhea) and it: ? Gets worse. ? Does not go away.  You have blood or something green (bile) in your throw-up.  You have blood in your poop (stool). This may cause poop to look black and tarry.  You have not peed in 6-8 hours.  You pass out (faint).  Your heart rate when you are sitting still is more than 100 beats a  minute.  You have trouble breathing. This information is not intended to replace advice given to you by your health care provider. Make sure you discuss any questions you have with your health care provider. Document Released: 10/11/2009 Document Revised: 11/27/2017 Document Reviewed: 02/08/2016 Elsevier Patient Education  2020 Elsevier Inc.  

## 2019-08-16 ENCOUNTER — Emergency Department
Admission: EM | Admit: 2019-08-16 | Discharge: 2019-08-16 | Discharge status: Against Medical Advice | Payer: Medicaid Other | Attending: Emergency Medicine | Admitting: Emergency Medicine

## 2019-08-16 ENCOUNTER — Other Ambulatory Visit: Payer: Self-pay

## 2019-08-16 ENCOUNTER — Encounter: Payer: Self-pay | Admitting: Emergency Medicine

## 2019-08-16 DIAGNOSIS — O99519 Diseases of the respiratory system complicating pregnancy, unspecified trimester: Secondary | ICD-10-CM | POA: Diagnosis not present

## 2019-08-16 DIAGNOSIS — Z87891 Personal history of nicotine dependence: Secondary | ICD-10-CM | POA: Diagnosis not present

## 2019-08-16 DIAGNOSIS — J45909 Unspecified asthma, uncomplicated: Secondary | ICD-10-CM | POA: Insufficient documentation

## 2019-08-16 DIAGNOSIS — O479 False labor, unspecified: Secondary | ICD-10-CM

## 2019-08-16 DIAGNOSIS — R0602 Shortness of breath: Secondary | ICD-10-CM | POA: Diagnosis not present

## 2019-08-16 DIAGNOSIS — Z3A00 Weeks of gestation of pregnancy not specified: Secondary | ICD-10-CM | POA: Diagnosis not present

## 2019-08-16 NOTE — ED Notes (Signed)
Midwife at the bedside to evaluate pt and fetus.

## 2019-08-16 NOTE — ED Triage Notes (Signed)
Pt to ED via EMS from home with c/o feeling anxious and sob. Pt states she was lying with her mom and daughter and felt very anxious making it difficult to breathe. Pt states she is having contractions as well. Pt reports she was evaulated on the labor and delivery unit at this hospital on Sunday with similar complaints and was given fluids and was monitored for a short while and felt much better. Pt talkative and pacing. Refuses to have anyone touch her or perform any diagnostic tests on her at this time.
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
## 2019-08-16 NOTE — ED Notes (Signed)
MD at the bedside to discuss plan of care with pt. Pt not located in exam room. Pt did not announce leaving.
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
## 2019-08-17 ENCOUNTER — Encounter: Payer: Medicaid Other | Admitting: Certified Nurse Midwife

## 2019-08-17 ENCOUNTER — Other Ambulatory Visit: Payer: Medicaid Other

## 2019-08-18 ENCOUNTER — Encounter: Payer: Self-pay | Admitting: *Deleted

## 2019-08-18 ENCOUNTER — Other Ambulatory Visit: Payer: Self-pay

## 2019-08-18 ENCOUNTER — Emergency Department
Admission: EM | Admit: 2019-08-18 | Discharge: 2019-08-18 | Discharge status: Against Medical Advice | Payer: Medicaid Other | Attending: Emergency Medicine | Admitting: Emergency Medicine

## 2019-08-18 DIAGNOSIS — O2686 Pruritic urticarial papules and plaques of pregnancy (PUPPP): Secondary | ICD-10-CM | POA: Insufficient documentation

## 2019-08-18 DIAGNOSIS — F259 Schizoaffective disorder, unspecified: Secondary | ICD-10-CM | POA: Insufficient documentation

## 2019-08-18 DIAGNOSIS — Z3A28 28 weeks gestation of pregnancy: Secondary | ICD-10-CM | POA: Diagnosis not present

## 2019-08-18 DIAGNOSIS — Z87891 Personal history of nicotine dependence: Secondary | ICD-10-CM | POA: Diagnosis not present

## 2019-08-18 DIAGNOSIS — R21 Rash and other nonspecific skin eruption: Secondary | ICD-10-CM | POA: Insufficient documentation

## 2019-08-18 DIAGNOSIS — J45909 Unspecified asthma, uncomplicated: Secondary | ICD-10-CM | POA: Diagnosis not present

## 2019-08-18 DIAGNOSIS — O99512 Diseases of the respiratory system complicating pregnancy, second trimester: Secondary | ICD-10-CM | POA: Insufficient documentation

## 2019-08-18 DIAGNOSIS — O99342 Other mental disorders complicating pregnancy, second trimester: Secondary | ICD-10-CM | POA: Insufficient documentation

## 2019-08-18 DIAGNOSIS — O9989 Other specified diseases and conditions complicating pregnancy, childbirth and the puerperium: Secondary | ICD-10-CM | POA: Diagnosis not present

## 2019-08-18 DIAGNOSIS — L309 Dermatitis, unspecified: Secondary | ICD-10-CM

## 2019-08-18 NOTE — ED Notes (Signed)
She was seen by EDP - pt with multiple complaints but then refuses to listen to EDP recommendations  - verbalizing  "I need an antibiotic - I have a skin infection"   No infection noted to her skin  - eczema is at baseline  Encouraged her to f/u with her OB MD
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
## 2019-08-18 NOTE — ED Provider Notes (Signed)
[facility] Emergency Department Provider Note       Time seen: ----------------------------------------- 12:50 PM on 08/18/2019 -----------------------------------------   I have reviewed the triage vital signs and the nursing notes.  HISTORY   Chief Complaint Generalized Body Aches    HPI Bridget Carpenter is a 28 y.o. female with a history of asthma, depression who presents to the ED for concerns about infection.  She reports generalized body aches and a rash that she is concerned may be secondary to infection.  Patient states she is 7 months pregnant and needs more Keflex.  Past Medical History:  Diagnosis Date  . Asthma   . Depression 2009   Inpatient psych admission for SI, dissociative fugue  . Dissociative disorder or reaction 2009  . Eczema   . H/O: suicide attempt   . ODD (oppositional defiant disorder)   . PTSD (post-traumatic stress disorder)   . Schizoaffective disorder ([place])   . Substance abuse [facility])     Patient Active Problem List   Diagnosis Date Noted  . Indication for care in labor or delivery 08/14/2019  . Dehydration 08/14/2019  . Rubella non-immune status, antepartum 08/01/2019  . Pain of round ligament affecting pregnancy, antepartum 08/01/2019  . Labor and delivery indication for care or intervention 07/30/2019  . Homelessness 07/25/2019  . Decreased fetal movement 07/02/2019  . Club foot, fetal, affecting care of mother, antepartum 06/29/2019  . Supervision of high risk pregnancy, antepartum 06/19/2019  . Cocaine abuse with cocaine-induced mood disorder ([place]) 04/15/2019  . Psychoactive substance-induced psychosis ([place]) 05/03/2018  . Bacterial vaginosis 12/25/2017  . PTSD (post-traumatic stress disorder) 09/02/2017  . MRSA carrier 07/18/2017  . Overdose 07/16/2017  . Aspiration pneumonia ([place]) 07/16/2017  . Acute respiratory failure ([place]) 07/16/2017  . Polysubstance abuse ([place]) 07/16/2017  . Eczema 05/08/2017   . Borderline personality disorder ([place]) 11/06/2016  . Cocaine use disorder, severe, dependence ([place]) 11/06/2016  . Cannabis use disorder, moderate, dependence ([place]) 11/06/2016  . Alcohol use disorder, mild, abuse 11/06/2016  . Asthma 09/23/2011  . Tobacco use disorder 09/15/2009    Past Surgical History:  Procedure Laterality Date  . ADENOIDECTOMY    . TONSILLECTOMY      Allergies Banana, Grapefruit extract, and Cantaloupe (diagnostic)  Social History Social History   Tobacco Use  . Smoking status: Former Smoker    Packs/day: 0.75    Years: 2.00    Pack years: 1.50    Types: Cigarettes  . Smokeless tobacco: Never Used  . Tobacco comment: Patient states she quit smoking "a long time ago"  Substance Use Topics  . Alcohol use: Not Currently    Alcohol/week: 1.0 standard drinks    Types: 1 Cans of beer per week    Comment: weekly  . Drug use: Not Currently    Types: Marijuana, "Crack" cocaine, Heroin    Comment: 4 grams daily    Review of Systems Constitutional: Negative for fever. Cardiovascular: Negative for chest pain. Respiratory: Negative for shortness of breath. Gastrointestinal: Negative for abdominal pain, vomiting and diarrhea. Musculoskeletal: Negative for back pain. Skin: Positive for rash Neurological: Negative for headaches, focal weakness or numbness.  All systems negative/normal/unremarkable except as stated in the HPI  ____________________________________________   PHYSICAL EXAM:  VITAL SIGNS: ED Triage Vitals  Enc Vitals Group     BP 08/18/19 1138 132/62     Pulse Rate 08/18/19 1138 77     Resp 08/18/19 1138 16     Temp 08/18/19 1138 98.7  F (37.1 C)     Temp Source 08/18/19 1138 Oral     SpO2 08/18/19 1138 100 %     Weight --      Height 08/18/19 1135 5\' 6"  (1.676 m)     Head Circumference --      Peak Flow --      Pain Score 08/18/19 1135 10     Pain Loc --      Pain Edu? --      Excl. in GC? --    Constitutional: Alert and  oriented. Well appearing and in no distress. Eyes: Conjunctivae are normal. Normal extraocular movements. Cardiovascular: Normal rate, regular rhythm. No murmurs, rubs, or gallops. Respiratory: Normal respiratory effort without tachypnea nor retractions. Breath sounds are clear and equal bilaterally. No wheezes/rales/rhonchi. Gastrointestinal: Soft and nontender. Normal bowel sounds Musculoskeletal: Nontender with normal range of motion in extremities. No lower extremity tenderness nor edema. Neurologic:  Normal speech and language. No gross focal neurologic deficits are appreciated.  Skin: Eczematous looking skin noted over the dorsum of her hands and feet, no areas of erythema or cellulitis noted Psychiatric: Mood and affect are normal. Speech and behavior are normal.  ___________________________________________  ED COURSE:  As part of my medical decision making, I reviewed the following data within the electronic MEDICAL RECORD NUMBER History obtained from family if available, nursing notes, old chart and ekg, as well as notes from prior ED visits. Patient presented for concerns for infection, clinically she has eczema but no other acute process.   Procedures  Bridget Carpenter was evaluated in Emergency Department on 08/18/2019 for the symptoms described in the history of present illness. She was evaluated in the context of the global COVID-19 pandemic, which necessitated consideration that the patient might be at risk for infection with the SARS-CoV-2 virus that causes COVID-19. Institutional protocols and algorithms that pertain to the evaluation of patients at risk for COVID-19 are in a state of rapid change based on information released by regulatory bodies including the CDC and federal and state organizations. These policies and algorithms were followed during the patient's care in the ED.  ____________________________________________   DIFFERENTIAL DIAGNOSIS   Eczema, schizoaffective  disorder  FINAL ASSESSMENT AND PLAN  Eczema   Plan: The patient had presented for concerns about skin infection.  Clinically she does not require any antibiotics which she is persistent and adamant that she receive.  Have advised I will not give her Keflex for eczema.  She abruptly left without any further treatment.   Ulice DashJohnathan E Zhion Pevehouse, MD    Note: This note was generated in part or whole with voice recognition software. Voice recognition is usually quite accurate but there are transcription errors that can and very often do occur. I apologize for any typographical errors that were not detected and corrected.     Emily FilbertWilliams, Bridgitte Felicetti E, MD 08/18/19 [phone]

## 2019-08-25 ENCOUNTER — Other Ambulatory Visit: Payer: Self-pay

## 2019-08-25 DIAGNOSIS — O0990 Supervision of high risk pregnancy, unspecified, unspecified trimester: Secondary | ICD-10-CM

## 2019-08-29 ENCOUNTER — Inpatient Hospital Stay: Admit: 2019-08-29 | Discharge: 2019-08-29 | Disposition: A | Discharge status: Home / Self Care | Payer: Medicaid Other

## 2019-08-29 ENCOUNTER — Observation Stay
Admission: EM | Admit: 2019-08-29 | Discharge: 2019-08-29 | Disposition: A | Discharge status: Other Acute Inpatient Hospital | Payer: Medicaid Other | Attending: Advanced Practice Midwife | Admitting: Advanced Practice Midwife

## 2019-08-29 DIAGNOSIS — F913 Oppositional defiant disorder: Secondary | ICD-10-CM | POA: Insufficient documentation

## 2019-08-29 DIAGNOSIS — O0990 Supervision of high risk pregnancy, unspecified, unspecified trimester: Secondary | ICD-10-CM

## 2019-08-29 DIAGNOSIS — Z87891 Personal history of nicotine dependence: Secondary | ICD-10-CM | POA: Insufficient documentation

## 2019-08-29 DIAGNOSIS — Z5900 Homelessness unspecified: Secondary | ICD-10-CM

## 2019-08-29 DIAGNOSIS — O3623X0 Maternal care for hydrops fetalis, third trimester, not applicable or unspecified: Principal | ICD-10-CM | POA: Insufficient documentation

## 2019-08-29 DIAGNOSIS — F4310 Post-traumatic stress disorder, unspecified: Secondary | ICD-10-CM | POA: Insufficient documentation

## 2019-08-29 DIAGNOSIS — F259 Schizoaffective disorder, unspecified: Secondary | ICD-10-CM | POA: Diagnosis not present

## 2019-08-29 DIAGNOSIS — F329 Major depressive disorder, single episode, unspecified: Secondary | ICD-10-CM | POA: Insufficient documentation

## 2019-08-29 DIAGNOSIS — O35HXX0 Maternal care for other (suspected) fetal abnormality and damage, fetal lower extremities anomalies, not applicable or unspecified: Secondary | ICD-10-CM | POA: Diagnosis present

## 2019-08-29 DIAGNOSIS — L2084 Intrinsic (allergic) eczema: Secondary | ICD-10-CM

## 2019-08-29 DIAGNOSIS — O99343 Other mental disorders complicating pregnancy, third trimester: Secondary | ICD-10-CM | POA: Insufficient documentation

## 2019-08-29 DIAGNOSIS — O358XX0 Maternal care for other (suspected) fetal abnormality and damage, not applicable or unspecified: Secondary | ICD-10-CM | POA: Diagnosis present

## 2019-08-29 DIAGNOSIS — Z3A30 30 weeks gestation of pregnancy: Secondary | ICD-10-CM | POA: Insufficient documentation

## 2019-08-29 DIAGNOSIS — Z20828 Contact with and (suspected) exposure to other viral communicable diseases: Secondary | ICD-10-CM | POA: Diagnosis not present

## 2019-08-29 LAB — SARS CORONAVIRUS 2 BY RT PCR (HOSPITAL ORDER, PERFORMED IN ~~LOC~~ HOSPITAL LAB): SARS Coronavirus 2: NEGATIVE

## 2019-08-29 MED ORDER — ACETAMINOPHEN 325 MG PO TABS
650.0000 mg | ORAL_TABLET | Freq: Once | ORAL | Status: AC
  Administered 2019-08-29: 17:00:00 650 mg via ORAL
  Filled 2019-08-29: qty 2

## 2019-08-29 MED ORDER — LACTATED RINGERS IV SOLN
INTRAVENOUS | Status: DC
  Administered 2019-08-29: 16:00:00 via INTRAVENOUS

## 2019-08-29 MED ORDER — TRIAMCINOLONE 0.1 % CREAM:EUCERIN CREAM 1:1
TOPICAL_CREAM | Freq: Two times a day (BID) | CUTANEOUS | Status: DC
  Administered 2019-08-29: 17:00:00 via TOPICAL
  Filled 2019-08-29: qty 1

## 2019-08-29 NOTE — Progress Notes (Signed)
Patient refusing fetal monitoring at this time.  Explained several times the importance of watching the fetal heart rate and contraction activity.  Patient removed monitors

## 2019-08-29 NOTE — H&P (Addendum)
OB History & Physical   History of Present Illness:  Chief Complaint: abdominal pain  HPI:  Bridget Carpenter is a 28 y.o. 233P1011 female at 4074w6d dated by ultrasound.  Her pregnancy has been complicated by schizoaffective disorder- bipolar type, substance abuse, fetus with bilateral club feet, late and limited care, most of her prenatal care in ED.  She was scheduled for ultrasound follow up with MFM today and called EMS to bring her to [facility] for abdominal pain. She arrived after the time of her appointment with MFM but they were still able to see her. Upon arrival to triage her main complaints were: she has ultrasound with MFM at 1 PM and "the baby is living in my ribs".     She denies contractions.   She denies leakage of fluid.   She denies vaginal bleeding.   She reports fetal movement.  Patient was taken from triage to MFM ultrasound. Dr Leatha GildingLivingston informed me that fetus was hydropic with pleural and brain effusion and recommends transfer to tertiary care. She is initiating transfer and we will complete the process after we have an accepting physician.   She has missed her 28 week appointment and has not done 28 week labs.   Total weight gain for pregnancy: 6.804 kg   Obstetrical Problem List: THIRD Problems (from 06/17/19 to present)    Problem Noted Resolved   Homelessness 07/25/2019 by Tresea MallGledhill, Grier Czerwinski, CNM No   Overview Signed 07/30/2019  1:23 PM by Farrel ConnersGutierrez, Colleen, CNM    [facility] in [place] on 28 July          Maternal Medical History:   Past Medical History:  Diagnosis Date  . Asthma   . Depression 2009   Inpatient psych admission for SI, dissociative fugue  . Dissociative disorder or reaction 2009  . Eczema   . H/O: suicide attempt   . ODD (oppositional defiant disorder)   . PTSD (post-traumatic stress disorder)   . Schizoaffective disorder (HCC)   . Substance abuse [facility](HCC)     Past Surgical History:  Procedure Laterality  Date  . ADENOIDECTOMY    . TONSILLECTOMY      Allergies  Allergen Reactions  . Banana Hives and Swelling  . Grapefruit Extract Itching and Swelling  . Cantaloupe (Diagnostic) Hives    Prior to Admission medications   Medication Sig Start Date End Date Taking? Authorizing Provider  cephALEXin (KEFLEX) 500 MG capsule Take 1 capsule (500 mg total) by mouth 4 (four) times daily. 08/14/19   Nadara MustardHarris, Robert P, MD  Prenat-FeCbn-FeAsp-Meth-FA-DHA (PRENATE MINI) 18-0.6-0.4-350 MG CAPS Take 1 capsule by mouth daily. Patient not taking: Reported on 07/25/2019 06/17/19   Farrel ConnersGutierrez, Colleen, CNM  triamcinolone ointment (KENALOG) 0.1 % Apply topically 3 (three) times daily. 08/01/19   Farrel ConnersGutierrez, Colleen, CNM    OB History  Gravida Para Term Preterm AB Living  3 1 1   1 1   SAB TAB Ectopic Multiple Live Births      1   1    # Outcome Date GA Lbr Len/2nd Weight Sex Delivery Anes PTL Lv  3 Current           2 Term 09/08/07   2693 g F Vag-Spont  N LIV  1 Ectopic              Birth Comments: treated with methotrexate    Prenatal care site: [facility]  Social History: She  reports that she has  quit smoking. Her smoking use included cigarettes. She has a 1.50 pack-year smoking history. She has never used smokeless tobacco. She reports previous alcohol use of about 1.0 standard drinks of alcohol per week. She reports previous drug use. Drugs: Marijuana, "Crack" cocaine, and Heroin.  Family History: family history includes Cleft palate in her cousin; Heart disease in her maternal grandmother; Mental illness in her father and mother; Sickle cell trait in her adoptive mother. She was adopted.    Review of Systems:  Review of Systems  Constitutional: Negative.   HENT: Negative.   Eyes: Negative.   Respiratory: Negative.   Cardiovascular: Negative.   Gastrointestinal: Positive for abdominal pain.  Genitourinary: Negative.   Musculoskeletal: Negative.   Skin: Positive for rash.  Neurological:  Negative.   Endo/Heme/Allergies: Negative.   Psychiatric/Behavioral: Negative.      Physical Exam:  LMP  (LMP Unknown)   Vital Signs: LMP  (LMP Unknown)  Constitutional: Well nourished, well developed female in no acute distress.  HEENT: normal Skin: eczema on hands and abdomen Cardiovascular: Regular rate and rhythm.   Extremity: no edema  Respiratory: Clear to auscultation bilateral. Normal respiratory effort Abdomen: FHT present Back: no CVAT Neuro: DTRs 2+, Cranial nerves grossly intact Psych: Alert and Oriented x3. No memory deficits. Normal mood and affect.  MS: normal gait, normal bilateral lower extremity ROM/strength/stability.  Toco: regular contractions q 1-4 minutes Fetal well being: 120 bpm, minimal variability, no accelerations, no decelerations Cervix: FT/thick/high  Please see MFM ultrasound report  Pertinent Results:  Prenatal Labs Blood type/Rh A positive  Antibody screen negative  Rubella Not immune  Varicella Immune    RPR Non-reactive  HBsAg negative  HIV negative  GC negative  Chlamydia negative  Genetic screening Bilateral club feet  1 hour GTT Not done  3 hour GTT NA  GBS Not done      Lab Results  Component Value Date   Declo NEGATIVE 07/31/2019  ]  Assessment:  Bridget Carpenter is a 28 y.o. G80P1011 female at [redacted]w[redacted]d with fetal hydrops per MFM- Dr Lehman Prom.   All plans for care discussed with patient  Plan:  1. Transfer patient to tertiary care center  2.   Dr Lelan Pons at [facility] is accepting physician 3.   Rapid Covid swab 4.   IV fluids 5.   Regular diet 6.   Tylenol for pain  Rod Can, CNM 08/29/2019 2:28 PM
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
## 2019-08-29 NOTE — Progress Notes (Signed)
Patient accessed. Stable for discharge.  Adrian Prows MD [facility], [facility] 08/29/2019 5:31 PM
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
## 2019-08-29 NOTE — ED Notes (Signed)
Pt transported to OBS 4 via wheelchair after [place] in [facility].  Report given to Zadie Rhine, RN & Rod Can, CNM prior to leaving the BP. Pt for transfer to [facility] to Lelan Pons, MD per Dr. Lehman Prom.

## 2019-08-29 NOTE — ED Notes (Signed)
Pt to [facility] for scheduled appt via wheelchair from [place] at 1330 by Reece Leader, RN.

## 2019-08-29 NOTE — ED Notes (Signed)
[place] called at [facility] to speak with Dr. Lehman Prom at [facility] on the phone.

## 2019-08-30 MED ORDER — ONDANSETRON HCL 4 MG/2ML IJ SOLN
4.00 | INTRAMUSCULAR | Status: DC
Start: ? — End: 2019-08-30

## 2019-08-30 MED ORDER — LIDOCAINE HCL 1 % IJ SOLN
0.50 | INTRAMUSCULAR | Status: DC
Start: ? — End: 2019-08-30

## 2019-08-30 MED ORDER — POLYETHYLENE GLYCOL 3350 17 G PO PACK
17.00 | PACK | ORAL | Status: DC
Start: ? — End: 2019-08-30

## 2019-08-30 MED ORDER — CYCLOBENZAPRINE HCL 10 MG PO TABS
5.00 | ORAL_TABLET | ORAL | Status: DC
Start: ? — End: 2019-08-30

## 2019-08-30 MED ORDER — PNV PRENATAL PLUS MULTIVITAMIN 27-1 MG PO TABS
1.00 | ORAL_TABLET | ORAL | Status: DC
Start: 2019-09-06 — End: 2019-08-30

## 2019-08-30 MED ORDER — ACETAMINOPHEN 325 MG PO TABS
650.00 | ORAL_TABLET | ORAL | Status: DC
Start: ? — End: 2019-08-30

## 2019-08-30 MED ORDER — MAGNESIUM HYDROXIDE 400 MG/5ML PO SUSP
15.00 | ORAL | Status: DC
Start: ? — End: 2019-08-30

## 2019-08-30 MED ORDER — DOCUSATE SODIUM 100 MG PO CAPS
100.00 | ORAL_CAPSULE | ORAL | Status: DC
Start: ? — End: 2019-08-30

## 2019-08-30 MED ORDER — LIDOCAINE 5 % EX PTCH
2.00 | MEDICATED_PATCH | CUTANEOUS | Status: DC
Start: 2019-09-26 — End: 2019-08-30

## 2019-08-30 MED ORDER — BETAMETHASONE SOD PHOS & ACET 6 (3-3) MG/ML IJ SUSP
12.00 | INTRAMUSCULAR | Status: DC
Start: 2019-08-31 — End: 2019-08-30

## 2019-08-31 ENCOUNTER — Other Ambulatory Visit: Payer: Self-pay | Admitting: Obstetrics & Gynecology

## 2019-08-31 DIAGNOSIS — F332 Major depressive disorder, recurrent severe without psychotic features: Secondary | ICD-10-CM | POA: Diagnosis present

## 2019-09-05 MED ORDER — DOCUSATE SODIUM 100 MG PO CAPS
100.00 | ORAL_CAPSULE | ORAL | Status: DC
Start: 2019-09-05 — End: 2019-09-05

## 2019-09-05 MED ORDER — FERROUS FUMARATE 324 (106 FE) MG PO TABS
324.00 | ORAL_TABLET | ORAL | Status: DC
Start: 2019-09-06 — End: 2019-09-05

## 2019-09-05 MED ORDER — HYDROCORTISONE 2.5 % EX OINT
TOPICAL_OINTMENT | CUTANEOUS | Status: DC
Start: 2019-09-05 — End: 2019-09-05

## 2019-09-26 MED ORDER — OXYCODONE HCL 5 MG PO TABS
5.00 | ORAL_TABLET | ORAL | Status: DC
Start: ? — End: 2019-09-26

## 2019-09-26 MED ORDER — TRIAMCINOLONE ACETONIDE 0.1 % EX CREA
TOPICAL_CREAM | CUTANEOUS | Status: DC
Start: 2019-09-26 — End: 2019-09-26

## 2019-09-26 MED ORDER — ONDANSETRON HCL 4 MG/2ML IJ SOLN
4.00 | INTRAMUSCULAR | Status: DC
Start: ? — End: 2019-09-26

## 2019-09-26 MED ORDER — ACETAMINOPHEN 325 MG PO TABS
975.00 | ORAL_TABLET | ORAL | Status: DC
Start: 2019-09-26 — End: 2019-09-26

## 2019-09-26 MED ORDER — LORAZEPAM 0.5 MG PO TABS
1.00 | ORAL_TABLET | ORAL | Status: DC
Start: ? — End: 2019-09-26

## 2019-09-26 MED ORDER — GENERIC EXTERNAL MEDICATION
0.25 | Status: DC
Start: ? — End: 2019-09-26

## 2019-09-26 MED ORDER — SIMETHICONE 80 MG PO CHEW
80.00 | CHEWABLE_TABLET | ORAL | Status: DC
Start: ? — End: 2019-09-26

## 2019-09-26 MED ORDER — CYCLOBENZAPRINE HCL 10 MG PO TABS
5.00 | ORAL_TABLET | ORAL | Status: DC
Start: ? — End: 2019-09-26

## 2019-09-26 MED ORDER — PNV PRENATAL PLUS MULTIVITAMIN 27-1 MG PO TABS
1.00 | ORAL_TABLET | ORAL | Status: DC
Start: 2019-09-27 — End: 2019-09-26

## 2019-09-26 MED ORDER — MELATONIN 3 MG PO TABS
3.00 | ORAL_TABLET | ORAL | Status: DC
Start: ? — End: 2019-09-26

## 2019-09-26 MED ORDER — GENERIC EXTERNAL MEDICATION
Status: DC
Start: ? — End: 2019-09-26

## 2019-09-26 MED ORDER — DOCUSATE SODIUM 100 MG PO CAPS
100.00 | ORAL_CAPSULE | ORAL | Status: DC
Start: 2019-09-26 — End: 2019-09-26

## 2019-09-26 MED ORDER — IBUPROFEN 600 MG PO TABS
600.00 | ORAL_TABLET | ORAL | Status: DC
Start: 2019-09-26 — End: 2019-09-26

## 2019-09-26 MED ORDER — OXYCODONE HCL 5 MG PO TABS
10.00 | ORAL_TABLET | ORAL | Status: DC
Start: 2019-09-26 — End: 2019-09-26

## 2019-10-05 ENCOUNTER — Emergency Department
Admission: EM | Admit: 2019-10-05 | Discharge: 2019-10-06 | Disposition: A | Discharge status: Home / Self Care | Payer: Medicaid Other | Attending: Student | Admitting: Student

## 2019-10-05 ENCOUNTER — Other Ambulatory Visit: Payer: Self-pay

## 2019-10-05 ENCOUNTER — Ambulatory Visit: Payer: Medicaid Other | Admitting: Advanced Practice Midwife

## 2019-10-05 ENCOUNTER — Encounter: Payer: Self-pay | Admitting: Emergency Medicine

## 2019-10-05 DIAGNOSIS — Z98891 History of uterine scar from previous surgery: Secondary | ICD-10-CM

## 2019-10-05 DIAGNOSIS — R109 Unspecified abdominal pain: Secondary | ICD-10-CM | POA: Diagnosis present

## 2019-10-05 DIAGNOSIS — R1084 Generalized abdominal pain: Secondary | ICD-10-CM | POA: Diagnosis not present

## 2019-10-05 DIAGNOSIS — F259 Schizoaffective disorder, unspecified: Secondary | ICD-10-CM | POA: Diagnosis not present

## 2019-10-05 LAB — CBC
HCT: 36.8 % (ref 36.0–46.0)
Hemoglobin: 11.8 g/dL — ABNORMAL LOW (ref 12.0–15.0)
MCH: 30.9 pg (ref 26.0–34.0)
MCHC: 32.1 g/dL (ref 30.0–36.0)
MCV: 96.3 fL (ref 80.0–100.0)
Platelets: 302 10*3/uL (ref 150–400)
RBC: 3.82 MIL/uL — ABNORMAL LOW (ref 3.87–5.11)
RDW: 13.1 % (ref 11.5–15.5)
WBC: 5.2 10*3/uL (ref 4.0–10.5)
nRBC: 0 % (ref 0.0–0.2)

## 2019-10-05 LAB — COMPREHENSIVE METABOLIC PANEL
ALT: 17 U/L (ref 0–44)
AST: 26 U/L (ref 15–41)
Albumin: 3.5 g/dL (ref 3.5–5.0)
Alkaline Phosphatase: 55 U/L (ref 38–126)
Anion gap: 12 (ref 5–15)
BUN: 15 mg/dL (ref 6–20)
CO2: 23 mmol/L (ref 22–32)
Calcium: 8.9 mg/dL (ref 8.9–10.3)
Chloride: 105 mmol/L (ref 98–111)
Creatinine, Ser: 0.88 mg/dL (ref 0.44–1.00)
GFR calc Af Amer: >60 mL/min (ref >60)
GFR calc non Af Amer: >60 mL/min (ref >60)
Glucose, Bld: 90 mg/dL (ref 70–99)
Potassium: 3.4 mmol/L — ABNORMAL LOW (ref 3.5–5.1)
Sodium: 140 mmol/L (ref 135–145)
Total Bilirubin: 0.7 mg/dL (ref 0.3–1.2)
Total Protein: 7.3 g/dL (ref 6.5–8.1)

## 2019-10-05 LAB — LIPASE, BLOOD: Lipase: 37 U/L (ref 11–51)

## 2019-10-05 MED ORDER — ACETAMINOPHEN 500 MG PO TABS: 1000.0000 mg | ORAL_TABLET | Freq: Once | ORAL | Status: DC

## 2019-10-05 NOTE — ED Notes (Signed)
Patient states she is having abdominal pain from c-section 9/25 with baby passing that day. Patient states her skin is also having issues. This Probation officer asked patient to remove gloves. Patient's hands are whitish from moisture from wearing gloves. This Probation officer educated patient not to wear gloves.

## 2019-10-05 NOTE — Discharge Instructions (Addendum)
Thank you for letting us take care of you in the emergency department today.  ° °Please continue to take any regular, prescribed medications.  ° °Please return to the ER for any new or worsening symptoms.  ° °
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
## 2019-10-05 NOTE — ED Provider Notes (Signed)
[facility] Emergency Department Provider Note   ____________________________________________   First MD Initiated Contact with Patient 10/05/19 2208     (approximate)  I have reviewed the triage vital signs and the nursing notes.   HISTORY  Chief Complaint Abdominal Pain    HPI [name] is a 28 y.o. female with past medical history of schizoaffective disorder, PTSD, and eczema presents to the ED complaining of abdominal pain.  Patient reports that she has been having pain in her stomach ever since she was discharged from [facility] labor and delivery.  Per chart, patient was diagnosed with fetal hydrops and C-section was performed on September 25.  Unfortunately, fetus did not survive.  She states much her abdomen has been hurting since then, but she denies any vaginal bleeding or discharge, has not had any vomiting or diarrhea.  Patient states she has been thinking about this a lot and it has been very upsetting for her.  She states she has been homeless since being discharged from [facility], was living at a shelter but was kicked out.  She would like to be evaluated for abdominal pain, but would also like to speak with social work regarding a place to stay.  She is very tearful but denies any SI, HI, auditory or visual hallucinations.        Past Medical History:  Diagnosis Date  . Asthma   . Depression 2009   Inpatient psych admission for SI, dissociative fugue  . Dissociative disorder or reaction 2009  . Eczema   . H/O: suicide attempt   . ODD (oppositional defiant disorder)   . PTSD (post-traumatic stress disorder)   . Schizoaffective disorder (HCC)   . Substance abuse [facility])     Patient Active Problem List   Diagnosis Date Noted  . MDD (major depressive disorder), recurrent episode, severe (HCC) 08/31/2019  . Indication for care in labor and delivery, antepartum 08/29/2019  . Hydrops fetalis 08/29/2019  . Indication for care in labor or  delivery 08/14/2019  . Dehydration 08/14/2019  . Rubella non-immune status, antepartum 08/01/2019  . Pain of round ligament affecting pregnancy, antepartum 08/01/2019  . Labor and delivery indication for care or intervention 07/30/2019  . Homelessness 07/25/2019  . Decreased fetal movement 07/02/2019  . Club foot, fetal, affecting care of mother, antepartum 06/29/2019  . Supervision of high risk pregnancy, antepartum 06/19/2019  . Cocaine abuse with cocaine-induced mood disorder (HCC) 04/15/2019  . 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/03/2018  . Bacterial vaginosis 12/25/2017  . PTSD (post-traumatic stress disorder) 09/02/2017  . MRSA carrier 07/18/2017  . Overdose 07/16/2017  . Aspiration pneumonia (HCC) 07/16/2017  . Acute respiratory failure (HCC) 07/16/2017  . Polysubstance abuse (HCC) 07/16/2017  . Eczema 05/08/2017  . Borderline personality disorder (HCC) 11/06/2016  . Cocaine use disorder, severe, dependence (HCC) 11/06/2016  . Cannabis use disorder, moderate, dependence (HCC) 11/06/2016  . Alcohol use disorder, mild, abuse 11/06/2016  . Asthma 09/23/2011  . Tobacco use disorder 09/15/2009    Past Surgical History:  Procedure Laterality Date  . ADENOIDECTOMY    . TONSILLECTOMY      Prior to Admission medications   Medication Sig Start Date End Date Taking? Authorizing Provider  cephALEXin (KEFLEX) 500 MG capsule Take 1 capsule (500 mg total) by mouth 4 (four) times daily. Patient not taking: Reported on 08/29/2019 08/14/19   Nadara Mustard, MD  Prenat-FeCbn-FeAsp-Meth-FA-DHA (PRENATE MINI) 18-0.6-0.4-350 MG CAPS Take 1 capsule by mouth daily. Patient not taking: Reported  on 07/25/2019 06/17/19   Dalia Heading, CNM  triamcinolone ointment (KENALOG) 0.1 % Apply topically 3 (three) times daily. Patient not taking: Reported on 08/29/2019 08/01/19   Dalia Heading, CNM    Allergies Banana, Cantaloupe (diagnostic), and Grapefruit extract  Family History   Adopted: Yes  Problem Relation Age of Onset  . Mental illness Mother   . Mental illness Father   . Heart disease Maternal Grandmother   . Sickle cell trait Adoptive Mother   . Cleft palate Cousin        maternal side    Social History Social History   Tobacco Use  . Smoking status: Former Smoker    Packs/day: 0.75    Years: 2.00    Pack years: 1.50    Types: Cigarettes  . Smokeless tobacco: Never Used  . Tobacco comment: Patient states she quit smoking "a long time ago"  Substance Use Topics  . Alcohol use: Not Currently    Alcohol/week: 1.0 standard drinks    Types: 1 Cans of beer per week  . Drug use: Not Currently    Types: Marijuana, "Crack" cocaine, Heroin    Review of Systems  Constitutional: No fever/chills Eyes: No visual changes. ENT: No sore throat. Cardiovascular: Denies chest pain. Respiratory: Denies shortness of breath. Gastrointestinal: Positive for abdominal pain.  No nausea, no vomiting.  No diarrhea.  No constipation. Genitourinary: Negative for dysuria.  Negative for vaginal bleeding or discharge. Musculoskeletal: Negative for back pain. Skin: Negative for rash. Neurological: Negative for headaches, focal weakness or numbness.  ____________________________________________   PHYSICAL EXAM:  VITAL SIGNS: ED Triage Vitals  Enc Vitals Group     BP 10/05/19 1845 (P) 136/67     Pulse Rate 10/05/19 1845 (!) (P) 103     Resp 10/05/19 1845 (P) 16     Temp 10/05/19 1845 (P) 98.8 F (37.1 C)     Temp Source 10/05/19 1845 (P) Oral     SpO2 10/05/19 1845 (P) 100 %     Weight 10/05/19 1847 165 lb (74.8 kg)     Height 10/05/19 1847 5\' 6"  (1.676 m)     Head Circumference --      Peak Flow --      Pain Score 10/05/19 1846 10     Pain Loc --      Pain Edu? --      Excl. in [place]? --     Constitutional: Alert and oriented. Eyes: Conjunctivae are normal. Head: Atraumatic. Nose: No congestion/rhinnorhea. Mouth/Throat: Mucous membranes are moist.  Neck: Normal ROM Cardiovascular: Normal rate, regular rhythm. Grossly normal heart sounds. Respiratory: Normal respiratory effort.  No retractions. Lungs CTAB. Gastrointestinal: Soft and nontender. No distention.  C-section surgical site clean, dry, and intact with no surrounding erythema, warmth, or drainage. Genitourinary: deferred Musculoskeletal: No lower extremity tenderness nor edema. Neurologic:  Normal speech and language. No gross focal neurologic deficits are appreciated. Skin:  Skin is warm, dry and intact. No rash noted. Psychiatric: Mood and affect are normal. Speech and behavior are normal.  ____________________________________________   LABS (all labs ordered are listed, but only abnormal results are displayed)  Labs Reviewed  COMPREHENSIVE METABOLIC PANEL - Abnormal; Notable for the following components:      Result Value   Potassium 3.4 (*)    All other components within normal limits  CBC - Abnormal; Notable for the following components:   RBC 3.82 (*)    Hemoglobin 11.8 (*)    All other components within  normal limits  LIPASE, BLOOD  URINALYSIS, COMPLETE (UACMP) WITH MICROSCOPIC     PROCEDURES  Procedure(s) performed (including Critical Care):  Procedures   ____________________________________________   INITIAL IMPRESSION / ASSESSMENT AND PLAN / ED COURSE       28 year old female with history of schizoaffective disorder presents to the ED complaining of abdominal pain as well as homelessness since suffering a fetal demise.  She has a nontender abdominal exam and denies any vaginal bleeding or discharge.  Doubt retained products of conception and H/H stable, doubt significant bleeding.  Her surgical site does not have any evidence of infection.  Low suspicion for acute abdominal pathology, patient asking for something to eat and appears comfortable.  The primary reason of her visit today appears to be social issues.  She has no place to stay and continues  to be very upset regarding what happened to her baby.  Approached patient on multiple occasions offering psychiatric evaluation, but she continues to decline.  Will have patient evaluated by social work in the morning.      ____________________________________________   FINAL CLINICAL IMPRESSION(S) / ED DIAGNOSES  Final diagnoses:  Generalized abdominal pain  S/P cesarean section  Schizoaffective disorder, unspecified type [facility](HCC)     ED Discharge Orders    None       Note:  This document was prepared using Dragon voice recognition software and may include unintentional dictation errors.   Chesley NoonJessup, Dionysios Massman, MD 10/05/19 [phone]

## 2019-10-05 NOTE — ED Notes (Signed)
ED Provider at bedside, with this writer at bedside.

## 2019-10-06 NOTE — ED Notes (Signed)
Pt asking for gloves from hospital, this RN informed pt that we do not give pts gloves. Pt walked around nurses station and pulled out gloves from box while this nurse was away from desk. Charge RN aware.

## 2019-10-06 NOTE — ED Notes (Signed)
Pt getting upset at discharge, states " I want to see the social worker again" this RN informed pt that she is being discharged and given all resources.

## 2019-10-06 NOTE — ED Notes (Signed)
Pt escorted out by security police

## 2019-10-06 NOTE — ED Notes (Signed)
Pt. Requested and was given meal tray and drink. 
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
## 2019-10-06 NOTE — ED Notes (Signed)
Pt verbalizes understanding. Pt given cab voucher by SW as well as bus pass. Pt given back pt belongings at this time

## 2019-10-06 NOTE — ED Notes (Signed)
Social work at bedside.  

## 2019-10-06 NOTE — ED Notes (Signed)
Pt coming out of room, upset stating " I want to see a doctor and get treated on the normal side" This RN informed pt that she was medically cleared and she had requested a Education officer, museum and that's why she is waiting to see SW. Pt rude, asking for this RN name and for a breakfast tray. PT states " can you do at least one thing for me"

## 2019-10-06 NOTE — Social Work (Signed)
CSW aware of consult. Will see if patient is open to group home placement or homeless shelter in another city.     [place], [place] ED  [phone]

## 2019-10-06 NOTE — ED Notes (Signed)
Pt provided breakfast tray at this time. 

## 2019-10-06 NOTE — ED Provider Notes (Signed)
-----------------------------------------   6:44 AM on 10/06/2019 -----------------------------------------   Blood pressure (P) 136/67, pulse (!) (P) 103, temperature (P) 98.8 F (37.1 C), temperature source (P) Oral, resp. rate (P) 16, height 5\' 6"  (1.676 m), weight 74.8 kg, SpO2 (P) 100 %.  The patient is calm and cooperative at this time.  There have been no acute events since the last update.  Awaiting disposition plan from Social Work team(s).   Paulette Blanch, MD 10/06/19 [phone]

## 2019-10-06 NOTE — ED Notes (Signed)
Pt walking around, continues to ask for a normal room. Pt informed that other rooms are held for medical pts. PT walked back into room

## 2019-10-06 NOTE — ED Notes (Signed)
RN spoke with security police about getting pt to bus stop due to so many bags.

## 2019-10-06 NOTE — ED Provider Notes (Signed)
Patient has been seen and evaluated by social work team, and provided resources.  She is stable for discharge.  As such, will proceed with discharge as planned.  Advised outpatient follow-up and given return precautions.   Lilia Pro., MD 10/06/19 [phone]
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
## 2019-10-11 ENCOUNTER — Emergency Department: Payer: Medicaid Other

## 2019-10-11 ENCOUNTER — Encounter: Payer: Self-pay | Admitting: Emergency Medicine

## 2019-10-11 ENCOUNTER — Emergency Department
Admission: EM | Admit: 2019-10-11 | Discharge: 2019-10-11 | Disposition: A | Discharge status: Home / Self Care | Payer: Medicaid Other | Source: Home / Self Care | Attending: Emergency Medicine | Admitting: Emergency Medicine

## 2019-10-11 ENCOUNTER — Emergency Department
Admission: EM | Admit: 2019-10-11 | Discharge: 2019-10-11 | Disposition: A | Discharge status: Home / Self Care | Payer: Medicaid Other | Attending: Emergency Medicine | Admitting: Emergency Medicine

## 2019-10-11 ENCOUNTER — Other Ambulatory Visit: Payer: Self-pay

## 2019-10-11 DIAGNOSIS — Z87891 Personal history of nicotine dependence: Secondary | ICD-10-CM | POA: Insufficient documentation

## 2019-10-11 DIAGNOSIS — R1084 Generalized abdominal pain: Secondary | ICD-10-CM

## 2019-10-11 DIAGNOSIS — F209 Schizophrenia, unspecified: Secondary | ICD-10-CM | POA: Diagnosis not present

## 2019-10-11 DIAGNOSIS — N739 Female pelvic inflammatory disease, unspecified: Secondary | ICD-10-CM | POA: Insufficient documentation

## 2019-10-11 DIAGNOSIS — Z590 Homelessness: Secondary | ICD-10-CM | POA: Insufficient documentation

## 2019-10-11 DIAGNOSIS — F4320 Adjustment disorder, unspecified: Secondary | ICD-10-CM | POA: Insufficient documentation

## 2019-10-11 DIAGNOSIS — N730 Acute parametritis and pelvic cellulitis: Secondary | ICD-10-CM

## 2019-10-11 DIAGNOSIS — R1030 Lower abdominal pain, unspecified: Secondary | ICD-10-CM | POA: Diagnosis present

## 2019-10-11 LAB — CBC
HCT: 36.3 % (ref 36.0–46.0)
Hemoglobin: 11.6 g/dL — ABNORMAL LOW (ref 12.0–15.0)
MCH: 30.7 pg (ref 26.0–34.0)
MCHC: 32 g/dL (ref 30.0–36.0)
MCV: 96 fL (ref 80.0–100.0)
Platelets: 313 10*3/uL (ref 150–400)
RBC: 3.78 MIL/uL — ABNORMAL LOW (ref 3.87–5.11)
RDW: 12.5 % (ref 11.5–15.5)
WBC: 5.4 10*3/uL (ref 4.0–10.5)
nRBC: 0 % (ref 0.0–0.2)

## 2019-10-11 LAB — WET PREP, GENITAL
Clue Cells Wet Prep HPF POC: NONE SEEN
Sperm: NONE SEEN
Trich, Wet Prep: NONE SEEN
Yeast Wet Prep HPF POC: NONE SEEN

## 2019-10-11 LAB — COMPREHENSIVE METABOLIC PANEL
ALT: 11 U/L (ref 0–44)
AST: 20 U/L (ref 15–41)
Albumin: 3.7 g/dL (ref 3.5–5.0)
Alkaline Phosphatase: 59 U/L (ref 38–126)
Anion gap: 7 (ref 5–15)
BUN: 16 mg/dL (ref 6–20)
CO2: 24 mmol/L (ref 22–32)
Calcium: 8.5 mg/dL — ABNORMAL LOW (ref 8.9–10.3)
Chloride: 108 mmol/L (ref 98–111)
Creatinine, Ser: 0.98 mg/dL (ref 0.44–1.00)
GFR calc Af Amer: >60 mL/min (ref >60)
GFR calc non Af Amer: >60 mL/min (ref >60)
Glucose, Bld: 83 mg/dL (ref 70–99)
Potassium: 3.4 mmol/L — ABNORMAL LOW (ref 3.5–5.1)
Sodium: 139 mmol/L (ref 135–145)
Total Bilirubin: 0.6 mg/dL (ref 0.3–1.2)
Total Protein: 7.5 g/dL (ref 6.5–8.1)

## 2019-10-11 LAB — URINALYSIS, COMPLETE (UACMP) WITH MICROSCOPIC
Bacteria, UA: NONE SEEN
Bilirubin Urine: NEGATIVE
Glucose, UA: NEGATIVE mg/dL
Hgb urine dipstick: NEGATIVE
Ketones, ur: NEGATIVE mg/dL
Leukocytes,Ua: NEGATIVE
Nitrite: NEGATIVE
Protein, ur: NEGATIVE mg/dL
Specific Gravity, Urine: 1.023 (ref 1.005–1.030)
pH: 5 (ref 5.0–8.0)

## 2019-10-11 LAB — RPR: RPR Ser Ql: NONREACTIVE

## 2019-10-11 LAB — RAPID HIV SCREEN (HIV 1/2 AB+AG)
HIV 1/2 Antibodies: NONREACTIVE
HIV-1 P24 Antigen - HIV24: NONREACTIVE

## 2019-10-11 LAB — POCT PREGNANCY, URINE: Preg Test, Ur: NEGATIVE

## 2019-10-11 MED ORDER — ACETAMINOPHEN 325 MG PO TABS
650.0000 mg | ORAL_TABLET | Freq: Once | ORAL | Status: AC
  Administered 2019-10-11: 13:00:00 650 mg via ORAL

## 2019-10-11 MED ORDER — DOXYCYCLINE HYCLATE 100 MG PO TABS: 100.0000 mg | ORAL_TABLET | Freq: Two times a day (BID) | ORAL | 0 refills | Status: DC

## 2019-10-11 MED ORDER — SODIUM CHLORIDE 0.9 % IV SOLN
1.0000 g | Freq: Once | INTRAVENOUS | Status: AC
  Administered 2019-10-11: 1 g via INTRAVENOUS
  Filled 2019-10-11: qty 10

## 2019-10-11 MED ORDER — AZITHROMYCIN 500 MG PO TABS
1000.0000 mg | ORAL_TABLET | Freq: Once | ORAL | Status: AC
  Administered 2019-10-11: 07:00:00 1000 mg via ORAL
  Filled 2019-10-11: qty 2

## 2019-10-11 MED ORDER — SODIUM CHLORIDE 0.9 % IV SOLN
100.0000 mg | Freq: Once | INTRAVENOUS | Status: AC
  Administered 2019-10-11: 08:00:00 100 mg via INTRAVENOUS
  Filled 2019-10-11: qty 100

## 2019-10-11 MED ORDER — IOHEXOL 300 MG/ML  SOLN
100.0000 mL | Freq: Once | INTRAMUSCULAR | Status: AC | PRN
  Administered 2019-10-11: 06:00:00 100 mL via INTRAVENOUS

## 2019-10-11 MED ORDER — ACETAMINOPHEN 325 MG PO TABS
ORAL_TABLET | ORAL | Status: AC
  Administered 2019-10-11: 650 mg via ORAL
  Filled 2019-10-11: qty 2

## 2019-10-11 NOTE — ED Provider Notes (Signed)
[facility] Emergency Department Provider Note   ____________________________________________    I have reviewed the triage vital signs and the nursing notes.   HISTORY  Chief Complaint Headache     HPI Bridget Carpenter is a 28 y.o. female with significant past medical history as noted below, discharged earlier today after significant work-up who presents today stating that she feels that her "eyes are puffy "and that she still has some abdominal discomfort and that she has had a mild headache.  She denies fevers or chills.  No nausea or vomiting.  Had antibiotics prescribed for her and received IV antibiotics in the emergency department.  Past Medical History:  Diagnosis Date  . Asthma   . Depression 2009   Inpatient psych admission for SI, dissociative fugue  . Dissociative disorder or reaction 2009  . Eczema   . H/O: suicide attempt   . ODD (oppositional defiant disorder)   . PTSD (post-traumatic stress disorder)   . Schizoaffective disorder (HCC)   . Substance abuse [facility])     Patient Active Problem List   Diagnosis Date Noted  . Adjustment disorder   . MDD (major depressive disorder), recurrent episode, severe (HCC) 08/31/2019  . Indication for care in labor and delivery, antepartum 08/29/2019  . Hydrops fetalis 08/29/2019  . Indication for care in labor or delivery 08/14/2019  . Dehydration 08/14/2019  . Rubella non-immune status, antepartum 08/01/2019  . Pain of round ligament affecting pregnancy, antepartum 08/01/2019  . Labor and delivery indication for care or intervention 07/30/2019  . Homelessness 07/25/2019  . Decreased fetal movement 07/02/2019  . Club foot, fetal, affecting care of mother, antepartum 06/29/2019  . Supervision of high risk pregnancy, antepartum 06/19/2019  . Cocaine abuse with cocaine-induced mood disorder (HCC) 04/15/2019  . 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/03/2018  . Bacterial  vaginosis 12/25/2017  . PTSD (post-traumatic stress disorder) 09/02/2017  . MRSA carrier 07/18/2017  . Overdose 07/16/2017  . Aspiration pneumonia (HCC) 07/16/2017  . Acute respiratory failure (HCC) 07/16/2017  . Polysubstance abuse (HCC) 07/16/2017  . Eczema 05/08/2017  . Borderline personality disorder (HCC) 11/06/2016  . Cocaine use disorder, severe, dependence (HCC) 11/06/2016  . Cannabis use disorder, moderate, dependence (HCC) 11/06/2016  . Alcohol use disorder, mild, abuse 11/06/2016  . Asthma 09/23/2011  . Tobacco use disorder 09/15/2009    Past Surgical History:  Procedure Laterality Date  . ADENOIDECTOMY    . TONSILLECTOMY      Prior to Admission medications   Medication Sig Start Date End Date Taking? Authorizing Provider  cephALEXin (KEFLEX) 500 MG capsule Take 1 capsule (500 mg total) by mouth 4 (four) times daily. Patient not taking: Reported on 08/29/2019 08/14/19   Nadara Mustard, MD  doxycycline (VIBRA-TABS) 100 MG tablet Take 1 tablet (100 mg total) by mouth 2 (two) times daily. 10/11/19   Minna Antis, MD  Prenat-FeCbn-FeAsp-Meth-FA-DHA (PRENATE MINI) 18-0.6-0.4-350 MG CAPS Take 1 capsule by mouth daily. Patient not taking: Reported on 07/25/2019 06/17/19   Farrel Conners, CNM  triamcinolone ointment (KENALOG) 0.1 % Apply topically 3 (three) times daily. Patient not taking: Reported on 08/29/2019 08/01/19   Farrel Conners, CNM     Allergies Banana, Cantaloupe (diagnostic), Grapefruit extract, and Albumin human  Family History  Adopted: Yes  Problem Relation Age of Onset  . Mental illness Mother   . Mental illness Father   . Heart disease Maternal Grandmother   . Sickle cell trait Adoptive Mother   . Cleft palate  Cousin        maternal side    Social History Social History   Tobacco Use  . Smoking status: Former Smoker    Packs/day: 0.75    Years: 2.00    Pack years: 1.50    Types: Cigarettes  . Smokeless tobacco: Never Used  .  Tobacco comment: Patient states she quit smoking "a long time ago"  Substance Use Topics  . Alcohol use: Not Currently    Alcohol/week: 1.0 standard drinks    Types: 1 Cans of beer per week  . Drug use: Not Currently    Types: Marijuana, "Crack" cocaine, Heroin    Review of Systems  Constitutional: No fever/chills Eyes: No visual changes.  ENT: No throat swelling Cardiovascular: Denies chest pain. Respiratory: Denies shortness of breath. Gastrointestinal: Diffuse abdominal cramping, intermittent, none currently.   Genitourinary: No dysuria Musculoskeletal: Negative for back pain. Skin: Negative for rash. Neurological: Mild global headache   ____________________________________________   PHYSICAL EXAM:  VITAL SIGNS: ED Triage Vitals  Enc Vitals Group     BP 10/11/19 1224 110/64     Pulse Rate 10/11/19 1224 66     Resp 10/11/19 1224 18     Temp 10/11/19 1224 98.2 F (36.8 C)     Temp src --      SpO2 10/11/19 1224 100 %     Weight 10/11/19 1222 74.8 kg (165 lb)     Height 10/11/19 1222 1.676 m (5\' 6" )     Head Circumference --      Peak Flow --      Pain Score 10/11/19 1222 10     Pain Loc --      Pain Edu? --      Excl. in [place]? --     Constitutional: Alert and oriented. Eyes: Conjunctivae are normal.  Eyelids normal, no swelling no erythema normal exam  Nose: No congestion/rhinnorhea. Mouth/Throat: Mucous membranes are moist.    Cardiovascular: Normal rate, regular rhythm. Grossly normal heart sounds.  Good peripheral circulation. Respiratory: Normal respiratory effort.  No retractions. Lungs CTAB. Gastrointestinal: Soft and nontender. No distention.  No CVA tenderness.  Reassuring abdominal exam Genitourinary: deferred, patient just had GU exam Musculoskeletal:  Warm and well perfused Neurologic:  Normal speech and language. No gross focal neurologic deficits are appreciated.  Skin:  Skin is warm, dry and intact. No rash noted. Psychiatric: Mood and affect  are normal. Speech and behavior are normal.  ____________________________________________   LABS (all labs ordered are listed, but only abnormal results are displayed)  Labs Reviewed - No data to display ____________________________________________  EKG  None ____________________________________________  RADIOLOGY  None ____________________________________________   PROCEDURES  Procedure(s) performed: No  Procedures   Critical Care performed: No ____________________________________________   INITIAL IMPRESSION / ASSESSMENT AND PLAN / ED COURSE  Pertinent labs & imaging results that were available during my care of the patient were reviewed by me and considered in my medical decision making (see chart for details).  Patient well-appearing and in no acute distress, exam is quite reassuring.  Reassurance provided, patient asked for and received food tray.  Tylenol for headache.  No psychiatric complaints at this time.  Appropriate for discharge at this time    ____________________________________________   FINAL CLINICAL IMPRESSION(S) / ED DIAGNOSES  Final diagnoses:  Generalized abdominal pain        Note:  This document was prepared using Dragon voice recognition software and may include unintentional dictation errors.   Everline Mahaffy,  Herbie Baltimore, MD 10/11/19 1414

## 2019-10-11 NOTE — ED Notes (Addendum)
No [place] trays available; pt given graham crackers and water

## 2019-10-11 NOTE — ED Notes (Addendum)
Upon assessment pt reports abdominal pain (generalized) but denies nausea, vomiting, or diarrhea. Pt request [place] tray.

## 2019-10-11 NOTE — ED Notes (Signed)
Patient transported to Ultrasound 

## 2019-10-11 NOTE — ED Provider Notes (Signed)
-----------------------------------------   10:31 AM on 10/11/2019 -----------------------------------------  Patient care assumed from Dr. Jari Pigg.  Patient has been seen by social work and provided resources and coupons for her antibiotic.  They state patient has Medicaid.  Psychiatry is seen the patient.  Do not believe patient requires hospitalization at this time and state the patient did not want outpatient resources.  Patient's ultrasound has resulted largely nonrevealing/normal.  Patient has been treated with Rocephin and Zithromax in the emergency department.  We will discharge with doxycycline to cover for any possible PID infection.   Harvest Dark, MD 10/11/19 [phone]

## 2019-10-11 NOTE — ED Notes (Signed)
Pt up to toilet at this time without difficulty.

## 2019-10-11 NOTE — ED Notes (Signed)
Pt refuses to sign for discharge

## 2019-10-11 NOTE — ED Notes (Addendum)
First Nurse Note; Prior to patient checking in, patient in from outside, asked for her discharge paperwork to be faxed to clerk of court.  Patient returns from outside to check back in, expresses that she was unable to get to her court date, states, "I'm not feeling well."    During registration, patient asks, "Can you be locked up if you are seeking care for yourself?"  Ambulatory in lobby, NAD noted at this time.
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
## 2019-10-11 NOTE — Consult Note (Signed)
[facility] Face-to-Face Psychiatry Consult   Reason for Consult: Patient requested to speak to psychiatry Referring Physician: Paschal Dopp Patient Identification: Bridget [place] Bridget Carpenter MRN:  [id number] Principal Diagnosis: <principal problem not specified> Diagnosis:  Active Problems:   * No active hospital problems. *   Total Time spent with patient: 30 minutes  Subjective:   Bridget Carpenter is a 28 y.o. female patient presented with complaints of potential STD infection, patient requested to speak to psychiatry.   HPI: Patient is a 28 year old female with a history of schizophrenia who presents to the ED with complaints of STD infection.  Patient reported sexual contact with a female.  Patient also had recent stillborn birth.  Patient had asked ED provider if she could speak to psychiatry.  Psychiatry consult was placed given patient's recent loss, and previous psychiatric history.  Patient states that she came to the ED with complaints of medical reasons.  She is not sure why psychiatry was talking to her but she does not mind talking to [place].  Patient states that she has recently undergone a procedure in which her uterus is scarred and she had lost the baby.  Patient reports feeling sad about this but denies depression.  Patient reports that her diagnosis of schizoaffective disorder is inaccurate due to her drug use.  Patient reports no longer using drugs at this time.  Patient is Hospital doctor to provide her with a place to stay given that she is homeless and no longer allowed at the shelter.  Patient reports that she has a long history of being in and out of the shelters in this area.  Patient reports no social contacts in the area.  Patient denies any psychotic symptoms at this time.  She is religious and does report praying to God but denies having God speak to her in a command manner.  Patient is not interested in psychiatric services at this time.  She feels that she does not need  medication.  Patient was informed that she does not meet criteria for inpatient hospitalization to which she agreed.  Past Psychiatric History: Patient has a history of schizoaffective disorder on her chart.  However patient details that this is not accurate and that she was just given this diagnosis due to her drug use.  Risk to Self:  No Risk to Others:  No Prior Inpatient Therapy:  No Prior Outpatient Therapy:  No  Past Medical History:  Past Medical History:  Diagnosis Date  . Asthma   . Depression 2009   Inpatient psych admission for SI, dissociative fugue  . Dissociative disorder or reaction 2009  . Eczema   . H/O: suicide attempt   . ODD (oppositional defiant disorder)   . PTSD (post-traumatic stress disorder)   . Schizoaffective disorder (HCC)   . Substance abuse [facility])     Past Surgical History:  Procedure Laterality Date  . ADENOIDECTOMY    . TONSILLECTOMY     Family History:  Family History  Adopted: Yes  Problem Relation Age of Onset  . Mental illness Mother   . Mental illness Father   . Heart disease Maternal Grandmother   . Sickle cell trait Adoptive Mother   . Cleft palate Cousin        maternal side   Family Psychiatric  History: Patient denies Social History:  Social History   Substance and Sexual Activity  Alcohol Use Not Currently  . Alcohol/week: 1.0 standard drinks  . Types: 1 Cans of beer per week  Social History   Substance and Sexual Activity  Drug Use Not Currently  . Types: Marijuana, "Crack" cocaine, Heroin    Social History   Socioeconomic History  . Marital status: Single    Spouse name: Not on file  . Number of children: 1  . Years of education: Not on file  . Highest education level: Not on file  Occupational History  . Not on file  Social Needs  . Financial resource strain: Very hard  . Food insecurity    Worry: Often true    Inability: Often true  . Transportation needs    Medical: No    Non-medical: No   Tobacco Use  . Smoking status: Former Smoker    Packs/day: 0.75    Years: 2.00    Pack years: 1.50    Types: Cigarettes  . Smokeless tobacco: Never Used  . Tobacco comment: Patient states she quit smoking "a long time ago"  Substance and Sexual Activity  . Alcohol use: Not Currently    Alcohol/week: 1.0 standard drinks    Types: 1 Cans of beer per week  . Drug use: Not Currently    Types: Marijuana, "Crack" cocaine, Heroin  . Sexual activity: Not Currently    Partners: Male  Lifestyle  . Physical activity    Days per week: 4 days    Minutes per session: 40 min  . Stress: Very much  Relationships  . Social Musicianconnections    Talks on phone: Never    Gets together: Never    Attends religious service: Never    Active member of club or organization: No    Attends meetings of clubs or organizations: Never    Relationship status: Never married  Other Topics Concern  . Not on file  Social History Narrative   Is homeless   Her mother is her daughter's gaurdian   Multiple psychiatric hospitalizations, not on any medication currently   Substance use disorder   Additional Social History:    Allergies:   Allergies  Allergen Reactions  . Banana Hives and Swelling  . Cantaloupe (Diagnostic) Hives  . Grapefruit Extract Itching and Swelling  . Albumin Human Other (See Comments)    Pt refuses all blood products.     Labs:  Results for orders placed or performed during the hospital encounter of 10/11/19 (from the past 48 hour(s))  CBC     Status: Abnormal   Collection Time: 10/11/19 12:46 AM  Result Value Ref Range   WBC 5.4 4.0 - 10.5 K/uL   RBC 3.78 (L) 3.87 - 5.11 MIL/uL   Hemoglobin 11.6 (L) 12.0 - 15.0 g/dL   HCT 96.036.3 45.436.0 - 09.846.0 %   MCV 96.0 80.0 - 100.0 fL   MCH 30.7 26.0 - 34.0 pg   MCHC 32.0 30.0 - 36.0 g/dL   RDW 11.912.5 14.711.5 - 82.915.5 %   Platelets 313 150 - 400 K/uL   nRBC 0.0 0.0 - 0.2 %    Comment: Performed at [facility], [address]  [place], [place] [id number]  Comprehensive metabolic panel     Status: Abnormal   Collection Time: 10/11/19 12:46 AM  Result Value Ref Range   Sodium 139 135 - 145 mmol/L   Potassium 3.4 (L) 3.5 - 5.1 mmol/L   Chloride 108 98 - 111 mmol/L   CO2 24 22 - 32 mmol/L   Glucose, Bld 83 70 - 99 mg/dL   BUN 16 6 - 20 mg/dL   Creatinine,  Ser 0.98 0.44 - 1.00 mg/dL   Calcium 8.5 (L) 8.9 - 10.3 mg/dL   Total Protein 7.5 6.5 - 8.1 g/dL   Albumin 3.7 3.5 - 5.0 g/dL   AST 20 15 - 41 U/L   ALT 11 0 - 44 U/L   Alkaline Phosphatase 59 38 - 126 U/L   Total Bilirubin 0.6 0.3 - 1.2 mg/dL   GFR calc non Af Amer >60 >60 mL/min   GFR calc Af Amer >60 >60 mL/min   Anion gap 7 5 - 15    Comment: Performed at [facility], [address] [place], [place] [address]  Urinalysis, Complete w Microscopic     Status: Abnormal   Collection Time: 10/11/19 12:46 AM  Result Value Ref Range   Color, Urine YELLOW (A) YELLOW   APPearance CLEAR (A) CLEAR   Specific Gravity, Urine 1.023 1.005 - 1.030   pH 5.0 5.0 - 8.0   Glucose, UA NEGATIVE NEGATIVE mg/dL   Hgb urine dipstick NEGATIVE NEGATIVE   Bilirubin Urine NEGATIVE NEGATIVE   Ketones, ur NEGATIVE NEGATIVE mg/dL   Protein, ur NEGATIVE NEGATIVE mg/dL   Nitrite NEGATIVE NEGATIVE   Leukocytes,Ua NEGATIVE NEGATIVE   RBC / HPF 0-5 0 - 5 RBC/hpf   WBC, UA 0-5 0 - 5 WBC/hpf   Bacteria, UA NONE SEEN NONE SEEN   Squamous Epithelial / LPF 0-5 0 - 5   Mucus PRESENT     Comment: Performed at [facility], [address] [place], [place] [address]  Pregnancy, urine POC     Status: None   Collection Time: 10/11/19  1:10 AM  Result Value Ref Range   Preg Test, Ur NEGATIVE NEGATIVE    Comment:        THE SENSITIVITY OF THIS METHODOLOGY IS >24 mIU/mL   Rapid HIV screen (HIV 1/2 Ab+Ag)     Status: None   Collection Time: 10/11/19  4:20 AM  Result Value Ref Range   HIV-1 P24 Antigen - HIV24 NON REACTIVE NON REACTIVE    Comment: (NOTE) Detection of p24  may be inhibited by biotin in the sample, causing false negative results in acute infection.    HIV 1/2 Antibodies NON REACTIVE NON REACTIVE   Interpretation (HIV Ag Ab)      A non reactive test result means that HIV 1 or HIV 2 antibodies and HIV 1 p24 antigen were not detected in the specimen.    Comment: Performed at [facility], [address] [place], [place] [address]  Wet prep, genital     Status: Abnormal   Collection Time: 10/11/19  5:59 AM   Specimen: Vaginal  Result Value Ref Range   Yeast Wet Prep HPF POC NONE SEEN NONE SEEN   Trich, Wet Prep NONE SEEN NONE SEEN   Clue Cells Wet Prep HPF POC NONE SEEN NONE SEEN   WBC, Wet Prep HPF POC FEW (A) NONE SEEN   Sperm NONE SEEN     Comment: Performed at [facility], [address] [place], [place] [address]    No current facility-administered medications for this encounter.    Current Outpatient Medications  Medication Sig Dispense Refill  . cephALEXin (KEFLEX) 500 MG capsule Take 1 capsule (500 mg total) by mouth 4 (four) times daily. (Patient not taking: Reported on 08/29/2019) 28 capsule 2  . doxycycline (VIBRA-TABS) 100 MG tablet Take 1 tablet (100 mg total) by mouth 2 (two) times daily. 20 tablet 0  .  Prenat-FeCbn-FeAsp-Meth-FA-DHA (PRENATE MINI) 18-0.6-0.4-350 MG CAPS Take 1 capsule by mouth daily. (Patient not taking: Reported on 07/25/2019) 30 capsule 11  . triamcinolone ointment (KENALOG) 0.1 % Apply topically 3 (three) times daily. (Patient not taking: Reported on 08/29/2019) 30 g 0    Musculoskeletal: Strength & Muscle Tone: within normal limits Gait & Station: normal Patient leans: N/A  Psychiatric Specialty Exam: Physical Exam  Review of Systems  Constitutional: Negative for fever.  HENT: Negative for hearing loss.   Eyes: Negative for blurred vision.  Cardiovascular: Positive for chest pain.  Skin: Negative for rash.  Psychiatric/Behavioral: Negative for depression, hallucinations,  substance abuse and suicidal ideas. The patient is not nervous/anxious.     Blood pressure 105/70, pulse 75, temperature 98.6 F (37 C), temperature source Oral, resp. rate 18, weight 74.8 kg, last menstrual period 10/11/2019, SpO2 97 %, unknown if currently breastfeeding.Body mass index is 26.63 kg/m.  General Appearance: Fairly Groomed  Eye Contact:  Fair  Speech:  Clear and Coherent  Volume:  Normal  Mood:  Dysphoric  Affect:  Congruent  Thought Process:  Coherent  Orientation:  Full (Time, Place, and Person)  Thought Content:  Logical  Suicidal Thoughts:  No  Homicidal Thoughts:  No  Memory:  Immediate;   Good  Judgement:  Intact  Insight:  Fair  Psychomotor Activity:  Normal  Concentration:  Concentration: Fair  Recall:  [employer] of Knowledge:  Fair  Language:  Fair  Akathisia:  No  Handed:  Right  AIMS (if indicated):     Assets:  Communication Skills Leisure Time  ADL's:  Intact  Cognition:  WNL  Sleep:        Treatment Plan Summary: No further management from psychiatric perspective.   Disposition: No evidence of imminent risk to self or others at present.   Patient does not meet criteria for psychiatric inpatient admission.  Clement Sayres, MD 10/11/2019 11:01 AM

## 2019-10-11 NOTE — ED Notes (Signed)
POC urine preg negative 

## 2019-10-11 NOTE — ED Notes (Signed)
Pt refusing to let this nurse check her blood pressure and update vital signs.

## 2019-10-11 NOTE — ED Notes (Signed)
Patient transported to CT 

## 2019-10-11 NOTE — ED Triage Notes (Addendum)
Pt states she is having a bad headache and her skin hurts. Pt also states her neck hurts too.  Pt denies any CP, dizziness or SOB  Pt states this all started just now.  Pt was just d/c from hospital earlier this am.  Pt states she was seen for abdominal pain and given fluids, but no one told her what the results were. Pt states she missed her court date and now needs to get her self checked out and meet her needs.

## 2019-10-11 NOTE — ED Notes (Signed)
Pt requesting icecream; pt given vanilla and chocolate icecream.
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
## 2019-10-11 NOTE — Social Work (Signed)
CSW acknowledged social work consult. CSW will not be able to provide resources for medication management due to patient having Medicaid. The needed medication is also not on the $4 prescription list at [facility].  Patient will be encouraged to find her needed medication on [email or web address]  CSW signing off.    [place], [place] [facility]  [phone]

## 2019-10-12 LAB — GC/CHLAMYDIA PROBE AMP
Chlamydia trachomatis, NAA: NEGATIVE
Neisseria Gonorrhoeae by PCR: NEGATIVE

## 2019-11-04 ENCOUNTER — Emergency Department
Admission: EM | Admit: 2019-11-04 | Discharge: 2019-11-04 | Disposition: A | Discharge status: Home / Self Care | Payer: Medicaid Other | Attending: Emergency Medicine | Admitting: Emergency Medicine

## 2019-11-04 ENCOUNTER — Other Ambulatory Visit: Payer: Self-pay

## 2019-11-04 DIAGNOSIS — R21 Rash and other nonspecific skin eruption: Secondary | ICD-10-CM | POA: Diagnosis not present

## 2019-11-04 DIAGNOSIS — F1210 Cannabis abuse, uncomplicated: Secondary | ICD-10-CM | POA: Diagnosis not present

## 2019-11-04 DIAGNOSIS — F1410 Cocaine abuse, uncomplicated: Secondary | ICD-10-CM | POA: Diagnosis not present

## 2019-11-04 DIAGNOSIS — J45909 Unspecified asthma, uncomplicated: Secondary | ICD-10-CM | POA: Insufficient documentation

## 2019-11-04 DIAGNOSIS — Z202 Contact with and (suspected) exposure to infections with a predominantly sexual mode of transmission: Secondary | ICD-10-CM | POA: Diagnosis not present

## 2019-11-04 DIAGNOSIS — Z87891 Personal history of nicotine dependence: Secondary | ICD-10-CM | POA: Diagnosis not present

## 2019-11-04 DIAGNOSIS — Z113 Encounter for screening for infections with a predominantly sexual mode of transmission: Secondary | ICD-10-CM

## 2019-11-04 LAB — RAPID HIV SCREEN (HIV 1/2 AB+AG)
HIV 1/2 Antibodies: NONREACTIVE
HIV-1 P24 Antigen - HIV24: NONREACTIVE

## 2019-11-04 LAB — WET PREP, GENITAL
Clue Cells Wet Prep HPF POC: NONE SEEN
Sperm: NONE SEEN
Trich, Wet Prep: NONE SEEN
Yeast Wet Prep HPF POC: NONE SEEN

## 2019-11-04 MED ORDER — PREDNISONE 20 MG PO TABS
60.0000 mg | ORAL_TABLET | Freq: Once | ORAL | Status: AC
  Administered 2019-11-04: 12:00:00 60 mg via ORAL
  Filled 2019-11-04: qty 3

## 2019-11-04 MED ORDER — AZITHROMYCIN 500 MG PO TABS: 500.0000 mg | ORAL_TABLET | Freq: Once | ORAL | Status: DC

## 2019-11-04 MED ORDER — DIPHENHYDRAMINE HCL 25 MG PO CAPS: 25.0000 mg | ORAL_CAPSULE | ORAL | 0 refills | Status: DC | PRN

## 2019-11-04 MED ORDER — DIPHENHYDRAMINE HCL 25 MG PO CAPS
25.0000 mg | ORAL_CAPSULE | Freq: Once | ORAL | Status: AC
  Administered 2019-11-04: 25 mg via ORAL
  Filled 2019-11-04: qty 1

## 2019-11-04 MED ORDER — CEFTRIAXONE SODIUM 250 MG IJ SOLR
250.0000 mg | Freq: Once | INTRAMUSCULAR | Status: AC
  Administered 2019-11-04: 12:00:00 250 mg via INTRAMUSCULAR
  Filled 2019-11-04: qty 250

## 2019-11-04 MED ORDER — CEFTRIAXONE SODIUM 1 G IJ SOLR: 1.0000 g | Freq: Once | INTRAMUSCULAR | Status: DC

## 2019-11-04 MED ORDER — AZITHROMYCIN 500 MG PO TABS
1000.0000 mg | ORAL_TABLET | Freq: Once | ORAL | Status: AC
  Administered 2019-11-04: 12:00:00 1000 mg via ORAL
  Filled 2019-11-04: qty 2

## 2019-11-04 MED ORDER — LIDOCAINE HCL (PF) 1 % IJ SOLN
INTRAMUSCULAR | Status: AC
  Filled 2019-11-04: qty 5
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
## 2019-11-04 NOTE — Discharge Instructions (Signed)
Please please take the Benadryl every 6 hours for your rash for 2 days.
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
## 2019-11-04 NOTE — ED Notes (Signed)
Sent 2 SST and purple top tube to lab.
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
## 2019-11-05 LAB — RPR: RPR Ser Ql: NONREACTIVE

## 2019-12-03 IMAGING — US US PELVIS COMPLETE
1 series · 14 of 25 positions shown · non-contrast
Comparison: CT earlier today

CLINICAL DATA: Abdominal pain.  Thickened endometrium on CT

EXAM:
TRANSABDOMINAL ULTRASOUND OF PELVIS
TECHNIQUE: Transabdominal ultrasound examination of the pelvis was performed
including evaluation of the uterus, ovaries, adnexal regions, and
pelvic cul-de-sac.

[Series 1: us pelvis complete · 14 of 48 slices shown]
[im 1/48]
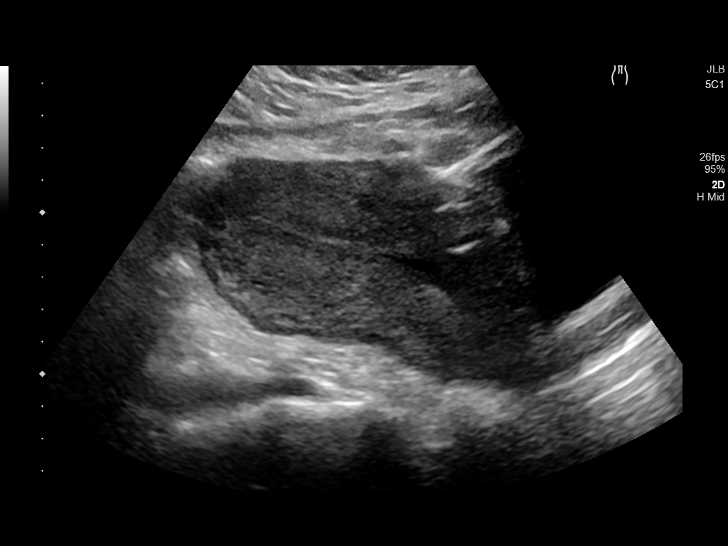
[im 4/48]
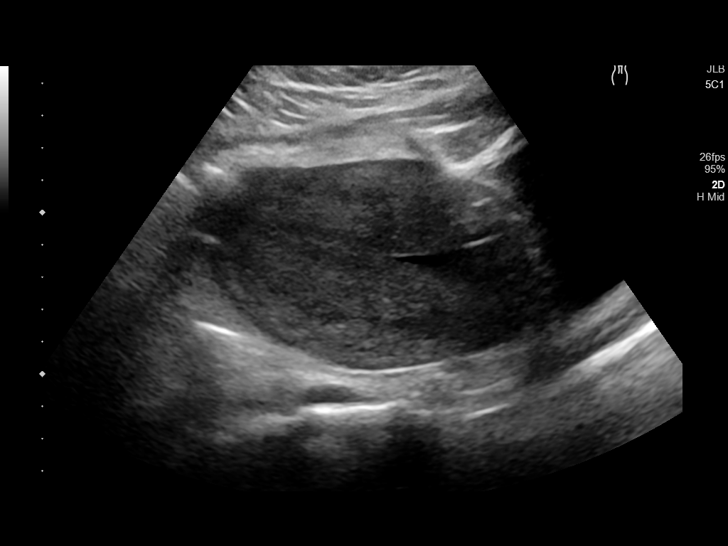
[im 8/48]
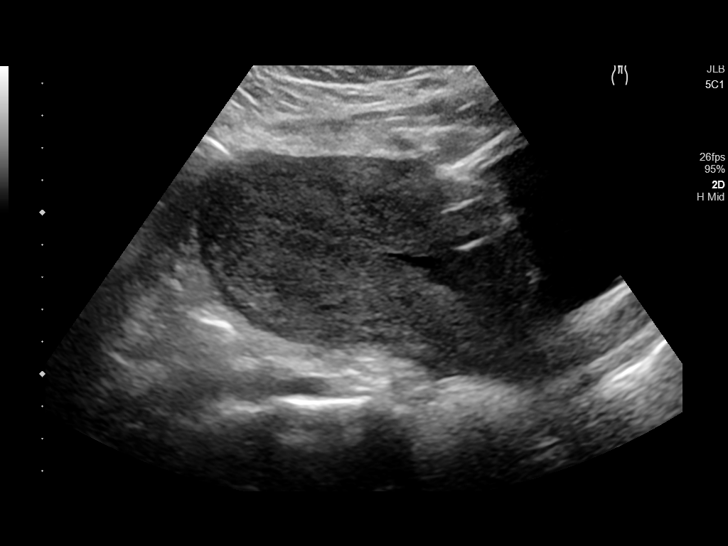
[im 12/48]
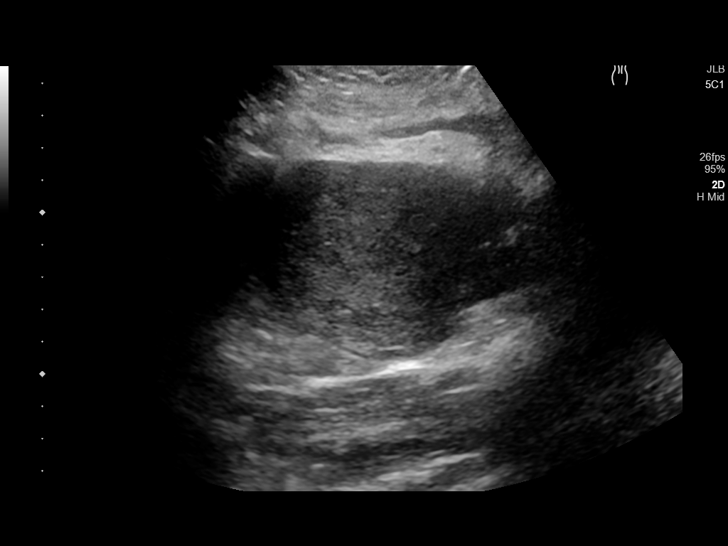
[im 16/48]
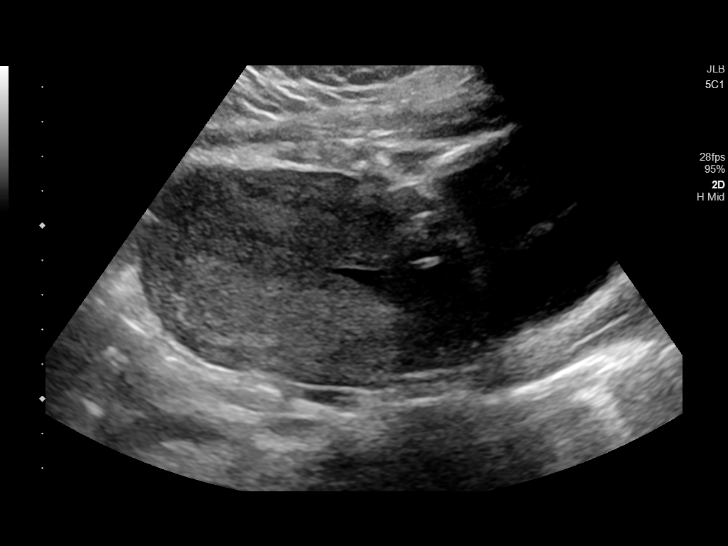
[im 18/48]
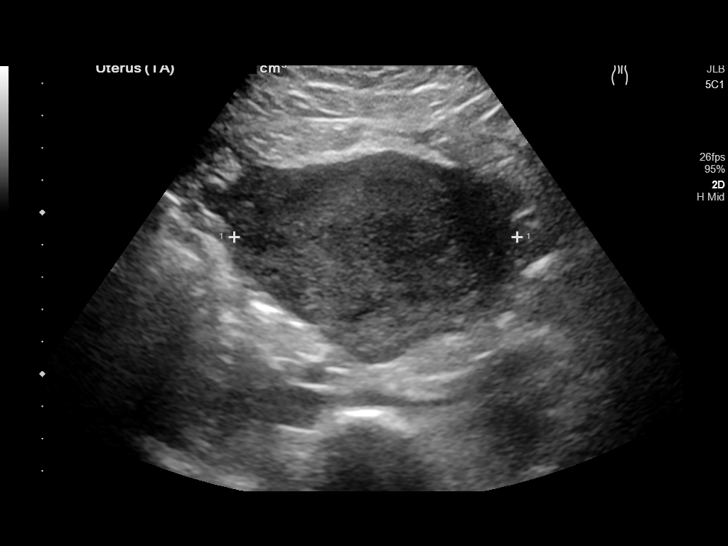
[im 22/48]
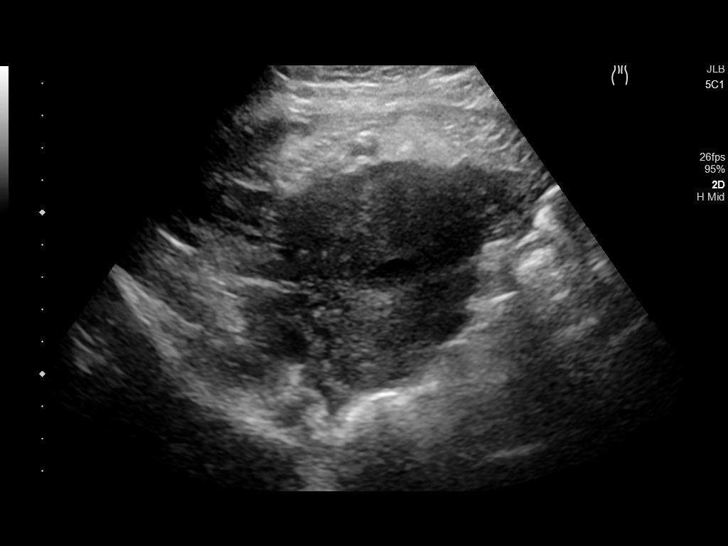
[im 26/48]
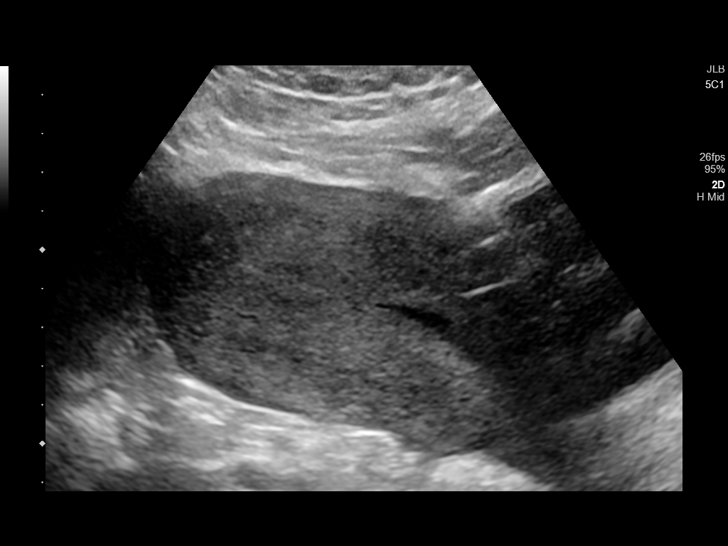
[im 30/48]
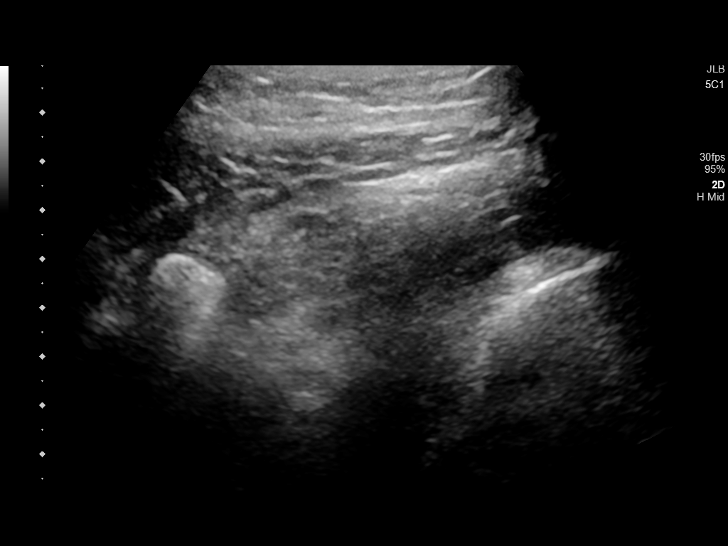
[im 32/48]
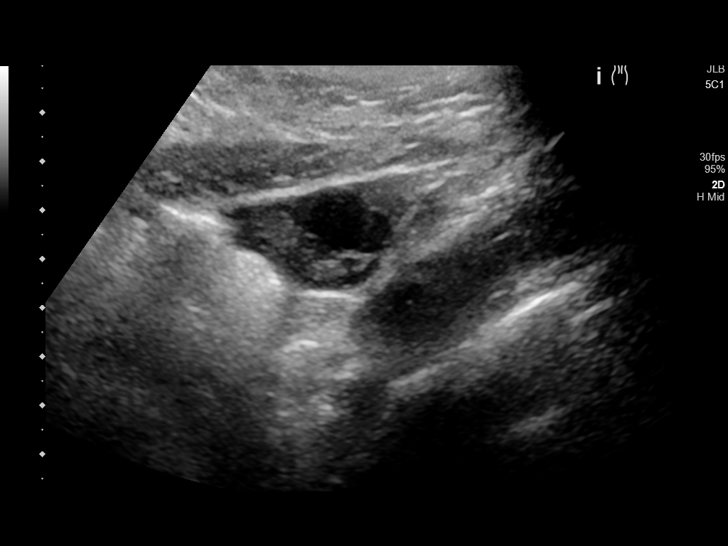
[im 36/48]
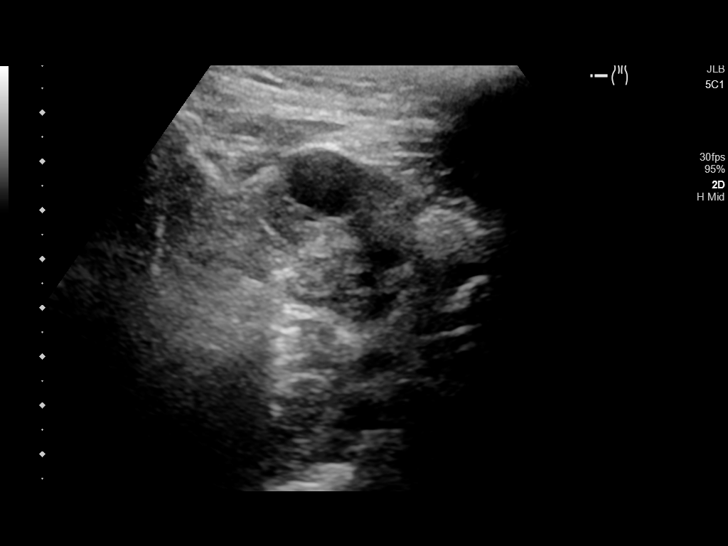
[im 40/48]
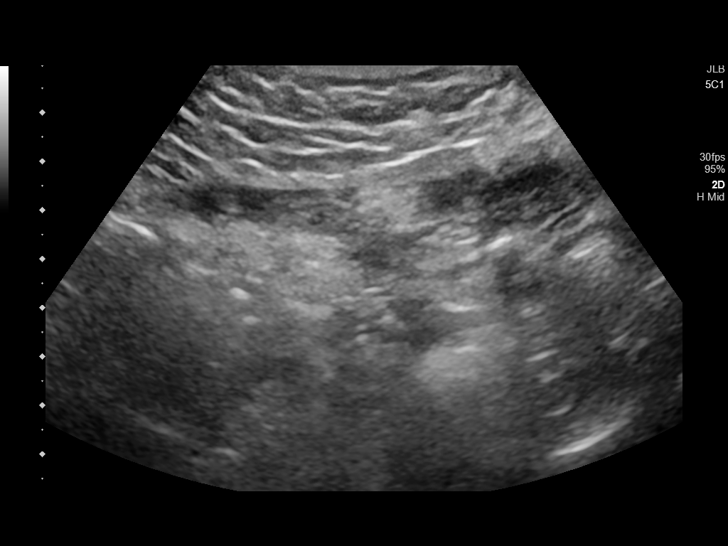
[im 44/48]
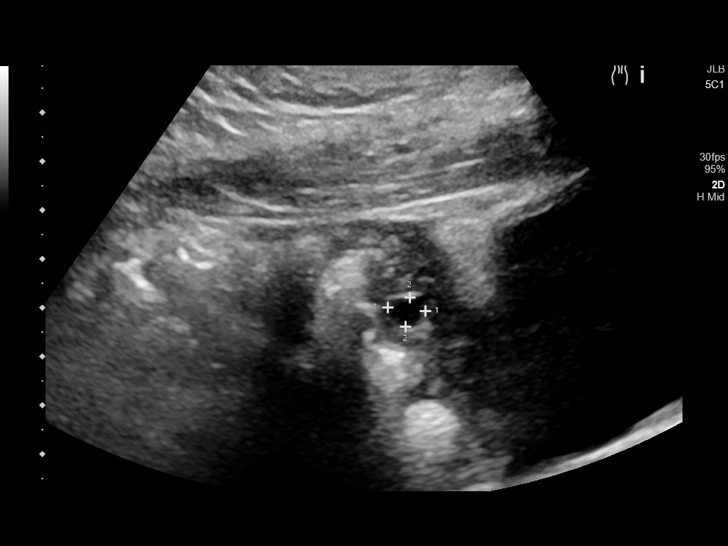
[im 48/48]
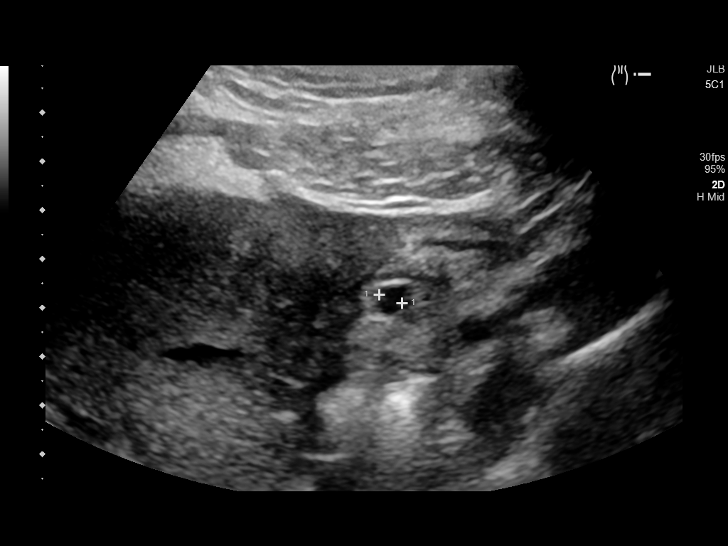

[14 of 25 positions shown; findings below may reference images not displayed]

FINDINGS: Uterus

Measurements: 11.5 x 6.0 x 8.8 cm = volume: 316 mL. No fibroids or
other mass visualized.

Endometrium

Thickness: 2 mm in thickness. Small amount of fluid within the
endometrial canal in the lower uterine segment. No focal abnormality
visualized.

Right ovary

Measurements: 3.1 x 3.1 x 3.1 cm = volume: 16 mL. Normal
appearance/no adnexal mass. 2.1 cm dominant follicle.

Left ovary

Measurements: 2.3 x 2.1 x 2.0 cm = volume: 5.0 mL. Normal
appearance/no adnexal mass.

Other findings:  No abnormal free fluid.
IMPRESSION: No acute findings or significant abnormality.

## 2019-12-03 IMAGING — CT CT ABD-PELV W/ CM
2 of 4 series · 15 of 46 positions shown, 17 images · IV contrast (omnipaque)
Comparison: Abdominal radiograph 07/16/2017. Limited abdominal
ultrasound August 13, 2016

CLINICAL DATA: Abdominal pain, generalized lower abdominal pain,
concern for pelvic infection

EXAM:
CT ABDOMEN AND PELVIS WITH CONTRAST
TECHNIQUE: Multidetector CT imaging of the abdomen and pelvis was performed
using the standard protocol following bolus administration of
intravenous contrast.
CONTRAST:  100mL OMNIPAQUE IOHEXOL 300 MG/ML  SOLN

[Series 2: routine abd/pel with · axial · 0.64mm/px · z∈[-1109,-719]mm · 12 of 90 slices shown, 14 images]
[im 8/90  soft-tissue]
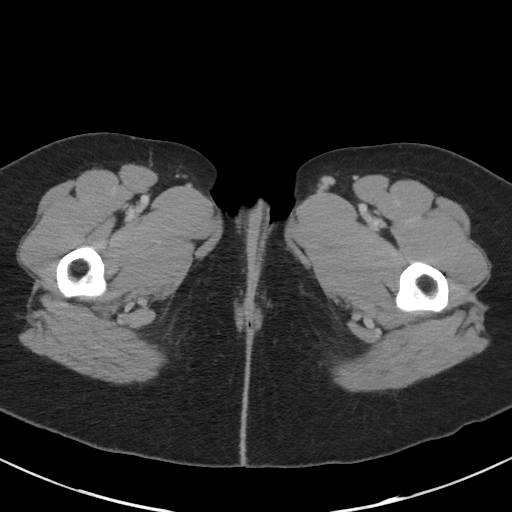
[im 8/90  bone]
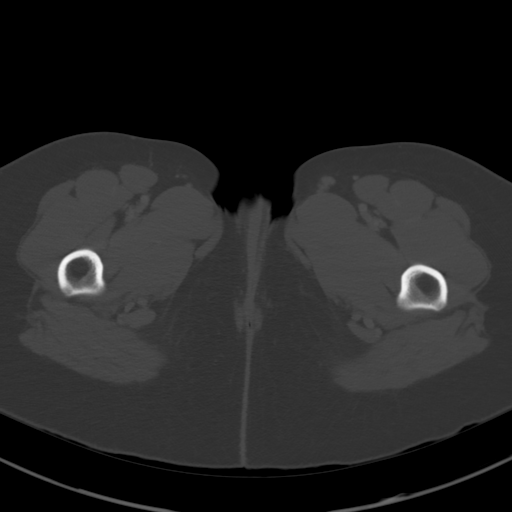
[im 15/90  soft-tissue]
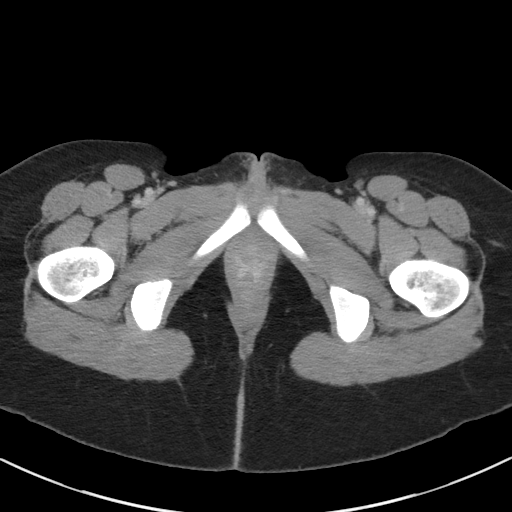
[im 22/90  soft-tissue]
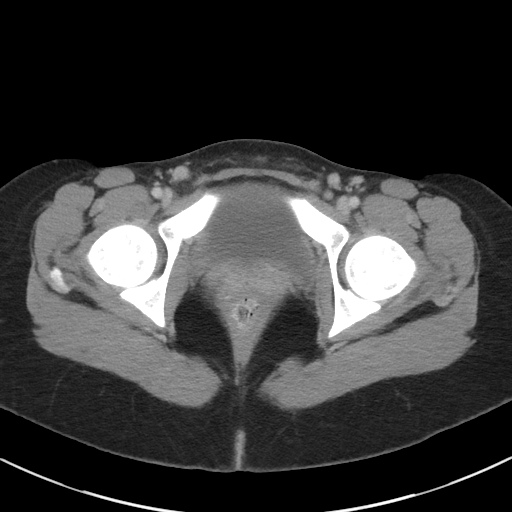
[im 29/90  soft-tissue]
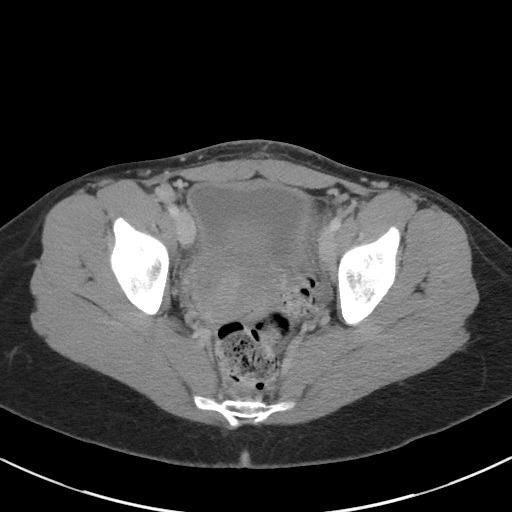
[im 36/90  soft-tissue]
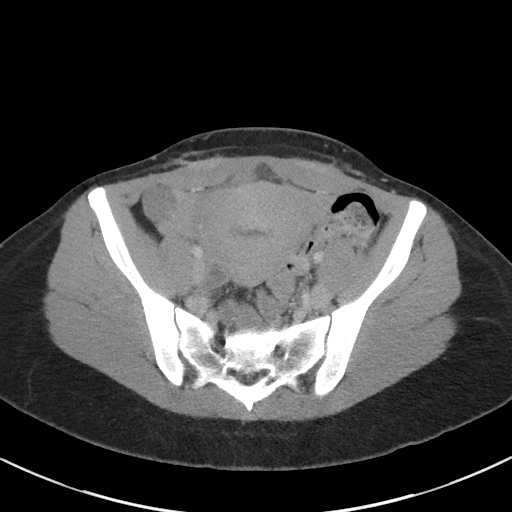
[im 43/90  soft-tissue]
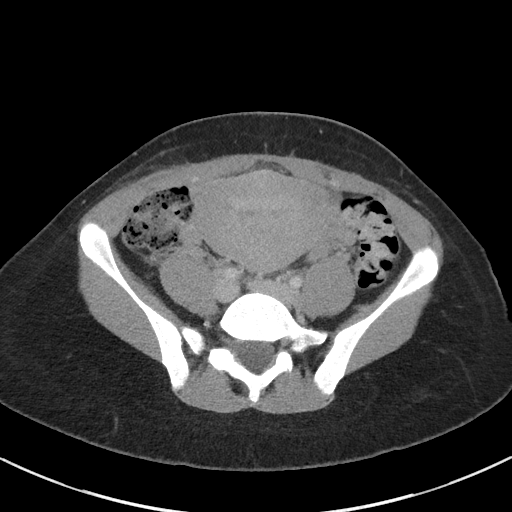
[im 50/90  soft-tissue]
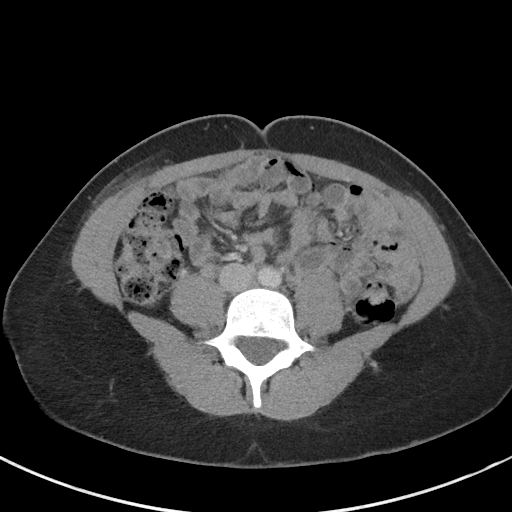
[im 57/90  soft-tissue]
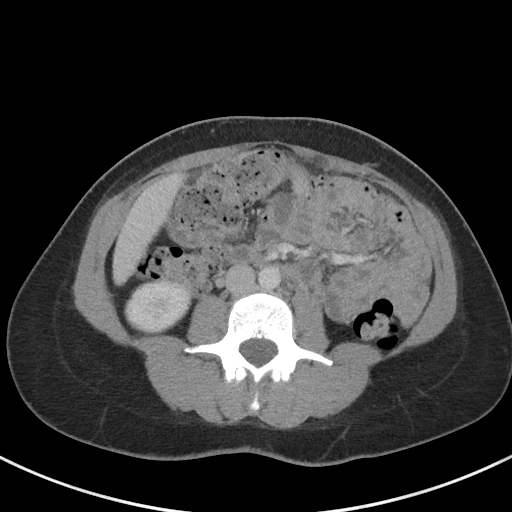
[im 65/90  soft-tissue]
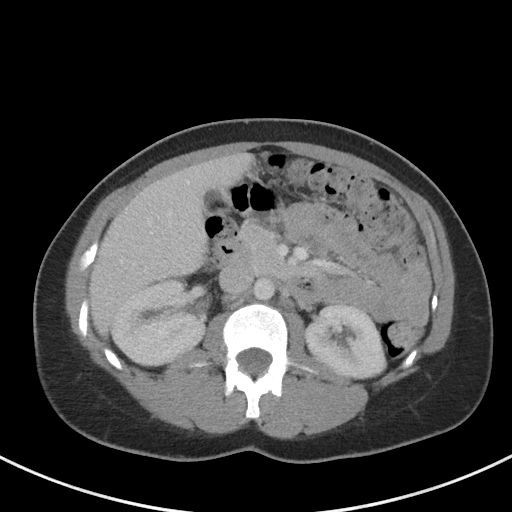
[im 65/90  bone]
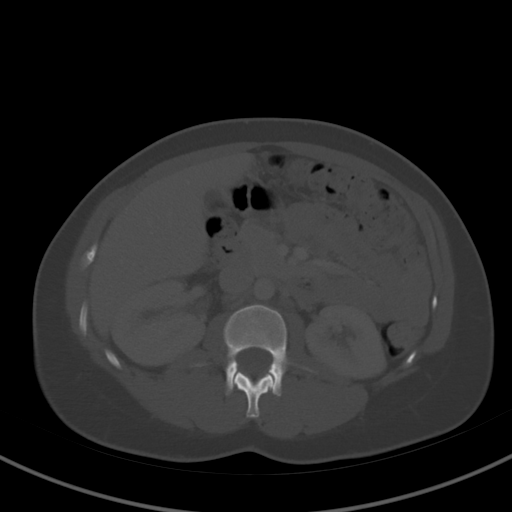
[im 72/90  soft-tissue]
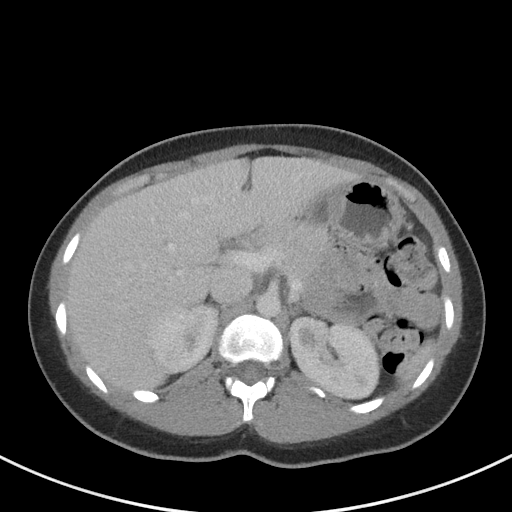
[im 79/90  soft-tissue]
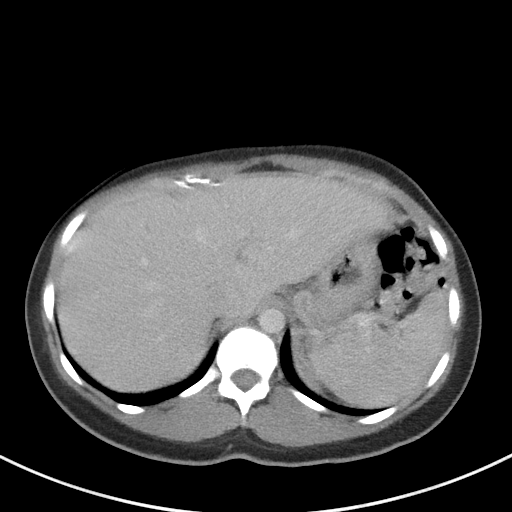
[im 86/90  soft-tissue]
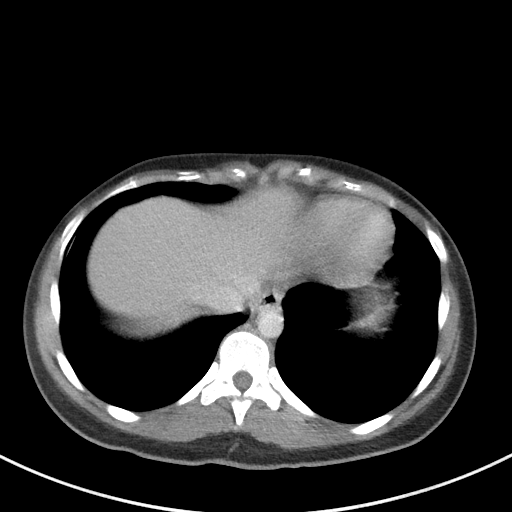

[Series 5: coronal st · coronal · 0.79mm/px · 3 of 79 slices shown]
[im 27/79  soft-tissue]
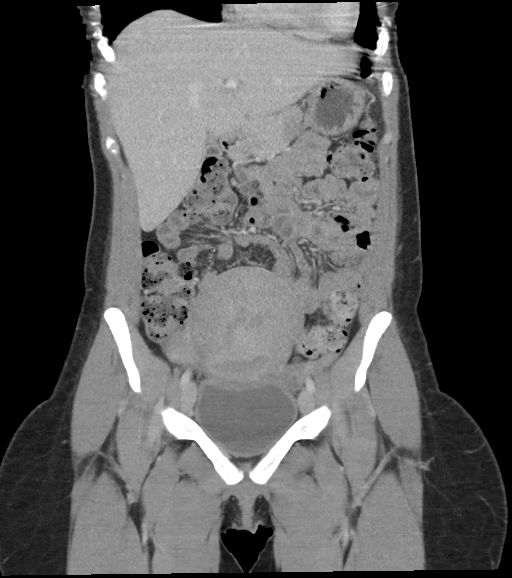
[im 35/79  soft-tissue]
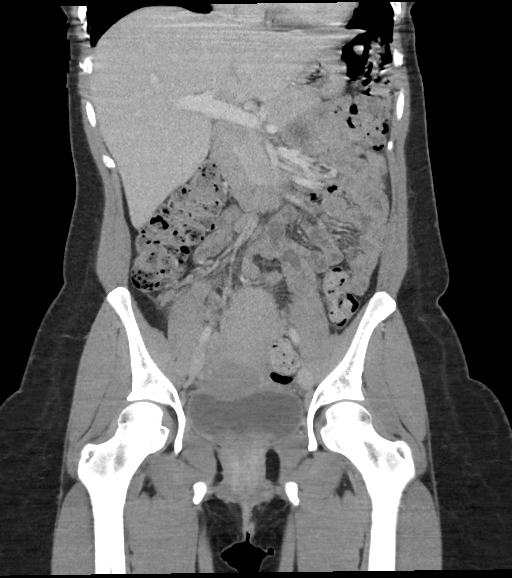
[im 44/79  soft-tissue]
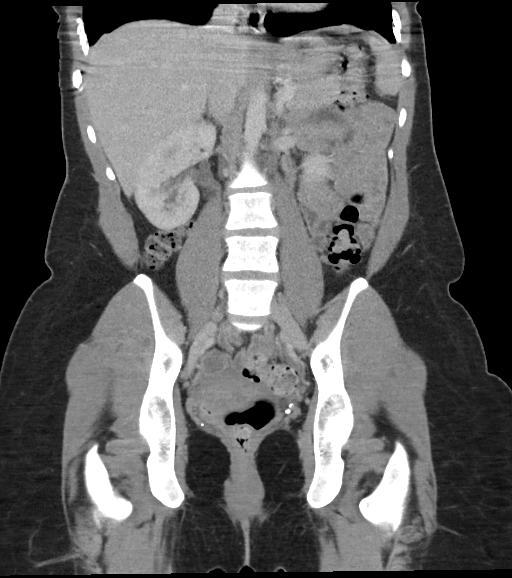

[15 of 46 positions shown; findings below may reference images not displayed]

FINDINGS: Lower chest: Lung bases are clear. Normal heart size. No pericardial
effusion.

Hepatobiliary: No focal liver abnormality is seen. Gallbladder
decompressed. No visible gallstones, gallbladder wall thickening, or
biliary dilatation.

Pancreas: Unremarkable. No pancreatic ductal dilatation or
surrounding inflammatory changes.

Spleen: Normal in size without focal abnormality.

Adrenals/Urinary Tract: Adrenal glands are unremarkable. Kidneys are
normal, without renal calculi, focal lesion, or hydronephrosis.
Circumferential bladder wall thickening and faint perivesicular
haze.

Stomach/Bowel: Distal esophagus, stomach and duodenal sweep are
unremarkable. No small bowel wall thickening or dilatation. No
evidence of obstruction.

Vascular/Lymphatic: The aorta is normal caliber. Few reactive groin
lymph nodes. No pathologically enlarged abdominopelvic lymph nodes.

Reproductive: The uterus is slightly retroflexed. Scarring is noted
in the lower uterine segment with additional postsurgical changes
suggesting prior Caesarean section and adherence to the bladder
dome. The uterine myometrium is somewhat thickened and edematous
appearing with a small amount of fluid within the endometrial canal.
There is some loss of clearly definable fat planes of the broad
ligament. Normal follicles are seen in both ovaries.

Other: Small volume of free fluid is noted in the deep pelvis.

Musculoskeletal: No acute osseous abnormality or suspicious osseous
lesion. Partial sacralization of the left transverse process of L5.
IMPRESSION: 1. Circumferential bladder wall thickening and faint perivesicular
haze. Correlate with urinalysis to assess for cystitis.
2. The uterine myometrium is somewhat thickened and edematous
appearing with a small amount of fluid within the endometrial canal.
There is some loss of clearly definable fat planes of the broad
ligament. Finding could be seen in the setting of both normal
menstruation versus possible pelvic inflammatory disease. Correlate
with physical exam findings. If further imaging evaluation is
clinically warranted consider proceeding with transabdominal
ultrasound if patient unable to tolerate transvaginal scanning.
3. Scarring in the lower uterine segment with features of prior
Pfannenstiel incision suggest history of cesarean section. Correlate
with patient history.
4. Normal follicles in the ovaries.
5. Small volume of free fluid in the deep pelvis, nonspecific, but
likely reactive.

## 2019-12-16 ENCOUNTER — Ambulatory Visit: Payer: Self-pay

## 2020-02-02 ENCOUNTER — Emergency Department
Admission: EM | Admit: 2020-02-02 | Discharge: 2020-02-03 | Disposition: A | Discharge status: Home / Self Care | Payer: Medicaid Other | Attending: Emergency Medicine | Admitting: Emergency Medicine

## 2020-02-02 ENCOUNTER — Encounter: Payer: Self-pay | Admitting: Emergency Medicine

## 2020-02-02 ENCOUNTER — Other Ambulatory Visit: Payer: Self-pay

## 2020-02-02 DIAGNOSIS — F1510 Other stimulant abuse, uncomplicated: Secondary | ICD-10-CM | POA: Diagnosis not present

## 2020-02-02 DIAGNOSIS — F19959 Other psychoactive substance use, unspecified with psychoactive substance-induced psychotic disorder, unspecified: Secondary | ICD-10-CM | POA: Diagnosis present

## 2020-02-02 DIAGNOSIS — J690 Pneumonitis due to inhalation of food and vomit: Secondary | ICD-10-CM | POA: Diagnosis present

## 2020-02-02 DIAGNOSIS — F1414 Cocaine abuse with cocaine-induced mood disorder: Secondary | ICD-10-CM | POA: Diagnosis present

## 2020-02-02 DIAGNOSIS — L309 Dermatitis, unspecified: Secondary | ICD-10-CM | POA: Diagnosis present

## 2020-02-02 DIAGNOSIS — O0990 Supervision of high risk pregnancy, unspecified, unspecified trimester: Secondary | ICD-10-CM

## 2020-02-02 DIAGNOSIS — F332 Major depressive disorder, recurrent severe without psychotic features: Secondary | ICD-10-CM | POA: Diagnosis present

## 2020-02-02 DIAGNOSIS — F4310 Post-traumatic stress disorder, unspecified: Secondary | ICD-10-CM | POA: Diagnosis not present

## 2020-02-02 DIAGNOSIS — O09899 Supervision of other high risk pregnancies, unspecified trimester: Secondary | ICD-10-CM

## 2020-02-02 DIAGNOSIS — F913 Oppositional defiant disorder: Secondary | ICD-10-CM | POA: Diagnosis not present

## 2020-02-02 DIAGNOSIS — R451 Restlessness and agitation: Secondary | ICD-10-CM | POA: Diagnosis present

## 2020-02-02 DIAGNOSIS — H5713 Ocular pain, bilateral: Secondary | ICD-10-CM | POA: Insufficient documentation

## 2020-02-02 DIAGNOSIS — F449 Dissociative and conversion disorder, unspecified: Secondary | ICD-10-CM | POA: Diagnosis not present

## 2020-02-02 DIAGNOSIS — F1220 Cannabis dependence, uncomplicated: Secondary | ICD-10-CM | POA: Diagnosis present

## 2020-02-02 DIAGNOSIS — F1110 Opioid abuse, uncomplicated: Secondary | ICD-10-CM | POA: Diagnosis not present

## 2020-02-02 DIAGNOSIS — J45909 Unspecified asthma, uncomplicated: Secondary | ICD-10-CM | POA: Diagnosis present

## 2020-02-02 DIAGNOSIS — F209 Schizophrenia, unspecified: Secondary | ICD-10-CM | POA: Diagnosis not present

## 2020-02-02 DIAGNOSIS — E860 Dehydration: Secondary | ICD-10-CM | POA: Diagnosis present

## 2020-02-02 DIAGNOSIS — B9689 Other specified bacterial agents as the cause of diseases classified elsewhere: Secondary | ICD-10-CM | POA: Diagnosis present

## 2020-02-02 DIAGNOSIS — Z5900 Homelessness unspecified: Secondary | ICD-10-CM

## 2020-02-02 DIAGNOSIS — F1420 Cocaine dependence, uncomplicated: Secondary | ICD-10-CM | POA: Diagnosis present

## 2020-02-02 DIAGNOSIS — Z22322 Carrier or suspected carrier of Methicillin resistant Staphylococcus aureus: Secondary | ICD-10-CM

## 2020-02-02 DIAGNOSIS — T50901A Poisoning by unspecified drugs, medicaments and biological substances, accidental (unintentional), initial encounter: Secondary | ICD-10-CM | POA: Diagnosis present

## 2020-02-02 DIAGNOSIS — F603 Borderline personality disorder: Secondary | ICD-10-CM | POA: Diagnosis present

## 2020-02-02 DIAGNOSIS — F1010 Alcohol abuse, uncomplicated: Secondary | ICD-10-CM | POA: Diagnosis present

## 2020-02-02 DIAGNOSIS — F329 Major depressive disorder, single episode, unspecified: Secondary | ICD-10-CM | POA: Insufficient documentation

## 2020-02-02 DIAGNOSIS — O26899 Other specified pregnancy related conditions, unspecified trimester: Secondary | ICD-10-CM | POA: Diagnosis present

## 2020-02-02 DIAGNOSIS — R102 Pelvic and perineal pain: Secondary | ICD-10-CM | POA: Diagnosis present

## 2020-02-02 DIAGNOSIS — O358XX0 Maternal care for other (suspected) fetal abnormality and damage, not applicable or unspecified: Secondary | ICD-10-CM | POA: Diagnosis present

## 2020-02-02 DIAGNOSIS — O35HXX0 Maternal care for other (suspected) fetal abnormality and damage, fetal lower extremities anomalies, not applicable or unspecified: Secondary | ICD-10-CM | POA: Diagnosis present

## 2020-02-02 DIAGNOSIS — F17200 Nicotine dependence, unspecified, uncomplicated: Secondary | ICD-10-CM | POA: Diagnosis present

## 2020-02-02 DIAGNOSIS — F1910 Other psychoactive substance abuse, uncomplicated: Secondary | ICD-10-CM | POA: Diagnosis present

## 2020-02-02 DIAGNOSIS — Z3490 Encounter for supervision of normal pregnancy, unspecified, unspecified trimester: Secondary | ICD-10-CM

## 2020-02-02 DIAGNOSIS — O368190 Decreased fetal movements, unspecified trimester, not applicable or unspecified: Secondary | ICD-10-CM | POA: Diagnosis present

## 2020-02-02 DIAGNOSIS — J9600 Acute respiratory failure, unspecified whether with hypoxia or hypercapnia: Secondary | ICD-10-CM | POA: Diagnosis present

## 2020-02-02 DIAGNOSIS — F4320 Adjustment disorder, unspecified: Secondary | ICD-10-CM | POA: Diagnosis present

## 2020-02-02 DIAGNOSIS — Z283 Underimmunization status: Secondary | ICD-10-CM

## 2020-02-02 LAB — CBC WITH DIFFERENTIAL/PLATELET
Abs Immature Granulocytes: 0.06 10*3/uL (ref 0.00–0.07)
Basophils Absolute: 0 10*3/uL (ref 0.0–0.1)
Basophils Relative: 0 %
Eosinophils Absolute: 0 10*3/uL (ref 0.0–0.5)
Eosinophils Relative: 0 %
HCT: 45.1 % (ref 36.0–46.0)
Hemoglobin: 14.4 g/dL (ref 12.0–15.0)
Immature Granulocytes: 1 %
Lymphocytes Relative: 8 %
Lymphs Abs: 0.8 10*3/uL (ref 0.7–4.0)
MCH: 29.9 pg (ref 26.0–34.0)
MCHC: 31.9 g/dL (ref 30.0–36.0)
MCV: 93.6 fL (ref 80.0–100.0)
Monocytes Absolute: 0.4 10*3/uL (ref 0.1–1.0)
Monocytes Relative: 4 %
Neutro Abs: 9.5 10*3/uL — ABNORMAL HIGH (ref 1.7–7.7)
Neutrophils Relative %: 87 %
Platelets: 175 10*3/uL (ref 150–400)
RBC: 4.82 MIL/uL (ref 3.87–5.11)
RDW: 12.8 % (ref 11.5–15.5)
WBC: 10.9 10*3/uL — ABNORMAL HIGH (ref 4.0–10.5)
nRBC: 0 % (ref 0.0–0.2)

## 2020-02-02 LAB — COMPREHENSIVE METABOLIC PANEL WITH GFR
ALT: 12 U/L (ref 0–44)
AST: 37 U/L (ref 15–41)
Albumin: 4.7 g/dL (ref 3.5–5.0)
Alkaline Phosphatase: 51 U/L (ref 38–126)
Anion gap: 17 — ABNORMAL HIGH (ref 5–15)
BUN: 16 mg/dL (ref 6–20)
CO2: 19 mmol/L — ABNORMAL LOW (ref 22–32)
Calcium: 9.6 mg/dL (ref 8.9–10.3)
Chloride: 105 mmol/L (ref 98–111)
Creatinine, Ser: 1.4 mg/dL — ABNORMAL HIGH (ref 0.44–1.00)
GFR calc Af Amer: 59 mL/min — ABNORMAL LOW
GFR calc non Af Amer: 51 mL/min — ABNORMAL LOW
Glucose, Bld: 100 mg/dL — ABNORMAL HIGH (ref 70–99)
Potassium: 3 mmol/L — ABNORMAL LOW (ref 3.5–5.1)
Sodium: 141 mmol/L (ref 135–145)
Total Bilirubin: 1.2 mg/dL (ref 0.3–1.2)
Total Protein: 8.4 g/dL — ABNORMAL HIGH (ref 6.5–8.1)

## 2020-02-02 MED ORDER — NAPHAZOLINE-GLYCERIN 0.012-0.2 % OP SOLN
2.0000 [drp] | Freq: Four times a day (QID) | OPHTHALMIC | Status: DC | PRN
  Administered 2020-02-03 (×2): 2 [drp] via OPHTHALMIC
  Filled 2020-02-02 (×2): qty 15

## 2020-02-02 MED ORDER — FLUORESCEIN SODIUM 1 MG OP STRP
1.0000 | ORAL_STRIP | Freq: Once | OPHTHALMIC | Status: AC
  Administered 2020-02-02: 21:00:00 1 via OPHTHALMIC
  Filled 2020-02-02: qty 1
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
## 2020-02-02 NOTE — ED Notes (Signed)
Pt. Up from bed using bathroom with no assist.  Pt. Returned to room with steady gait.

## 2020-02-02 NOTE — ED Notes (Signed)
Pt. States that she is unable to see.  Pt. Has eyes closed.  When pt. Opens eyes pt. Has excess tears.

## 2020-02-02 NOTE — ED Notes (Signed)
Pt. Currently sleeping in bed quad #21.
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
## 2020-02-02 NOTE — ED Provider Notes (Addendum)
[facility] Emergency Department Provider Note    First MD Initiated Contact with Patient 02/02/20 1625     (approximate)  I have reviewed the triage vital signs and the nursing notes.   HISTORY  Chief Complaint Psychiatric Evaluation    HPI Bridget Carpenter is a 29 y.o. female the below listed past medical history presents to the ER for acute agitation.  Reportedly ran into her mother's house naked and jumped in the shower.  She is not supposed to be there.  Is displaying very abnormal bizarre behavior according to EMS.  Did require restraints and became acutely agitated with police and EMS requiring IM Haldol.  Patient currently calm but does appear more sedated after receiving Haldol.  She denies any SI or HI.  No hallucinations.    Past Medical History:  Diagnosis Date  . Asthma   . Depression 2009   Inpatient psych admission for SI, dissociative fugue  . Dissociative disorder or reaction 2009  . Eczema   . H/O: suicide attempt   . ODD (oppositional defiant disorder)   . PTSD (post-traumatic stress disorder)   . Schizoaffective disorder ([place])   . Substance abuse ([place])    Family History  Adopted: Yes  Problem Relation Age of Onset  . Mental illness Mother   . Mental illness Father   . Heart disease Maternal Grandmother   . Sickle cell trait Adoptive Mother   . Cleft palate Cousin        maternal side   Past Surgical History:  Procedure Laterality Date  . ADENOIDECTOMY    . TONSILLECTOMY     Patient Active Problem List   Diagnosis Date Noted  . Adjustment disorder   . MDD (major depressive disorder), recurrent episode, severe ([place]) 08/31/2019  . Indication for care in labor and delivery, antepartum 08/29/2019  . Hydrops fetalis 08/29/2019  . Indication for care in labor or delivery 08/14/2019  . Dehydration 08/14/2019  . Rubella non-immune status, antepartum 08/01/2019  . Pain of round ligament affecting pregnancy, antepartum  08/01/2019  . Labor and delivery indication for care or intervention 07/30/2019  . Homelessness 07/25/2019  . Decreased fetal movement 07/02/2019  . Club foot, fetal, affecting care of mother, antepartum 06/29/2019  . Supervision of high risk pregnancy, antepartum 06/19/2019  . Cocaine abuse with cocaine-induced mood disorder ([place]) 04/15/2019  . Psychoactive substance-induced psychosis ([place]) 05/03/2018  . Bacterial vaginosis 12/25/2017  . PTSD (post-traumatic stress disorder) 09/02/2017  . MRSA carrier 07/18/2017  . Overdose 07/16/2017  . Aspiration pneumonia ([place]) 07/16/2017  . Acute respiratory failure ([place]) 07/16/2017  . Polysubstance abuse ([place]) 07/16/2017  . Eczema 05/08/2017  . Borderline personality disorder ([place]) 11/06/2016  . Cocaine use disorder, severe, dependence ([place]) 11/06/2016  . Cannabis use disorder, moderate, dependence ([place]) 11/06/2016  . Alcohol use disorder, mild, abuse 11/06/2016  . Asthma 09/23/2011  . Tobacco use disorder 09/15/2009      Prior to Admission medications   Medication Sig Start Date End Date Taking? Authorizing Provider  diphenhydrAMINE (BENADRYL) 25 mg capsule Take 1 capsule (25 mg total) by mouth every 4 (four) hours as needed. 11/04/19 11/03/20  Laban Emperor, PA-C  doxycycline (VIBRA-TABS) 100 MG tablet Take 1 tablet (100 mg total) by mouth 2 (two) times daily. Patient not taking: Reported on 11/04/2019 10/11/19   Harvest Dark, MD    Allergies Banana, Cantaloupe (diagnostic), Grapefruit extract, and Albumin human    Social History Social History   Tobacco Use  .  Smoking status: Former Smoker    Packs/day: 0.75    Years: 2.00    Pack years: 1.50    Types: Cigarettes  . Smokeless tobacco: Never Used  . Tobacco comment: Patient states she quit smoking "a long time ago"  Substance Use Topics  . Alcohol use: Not Currently    Alcohol/week: 1.0 standard drinks    Types: 1 Cans of beer per week  . Drug use: Not Currently     Types: Marijuana, "Crack" cocaine, Heroin    Review of Systems Patient denies headaches, rhinorrhea, blurry vision, numbness, shortness of breath, chest pain, edema, cough, abdominal pain, nausea, vomiting, diarrhea, dysuria, fevers, rashes or hallucinations unless otherwise stated above in HPI. ____________________________________________   PHYSICAL EXAM:  VITAL SIGNS: Vitals:   02/02/20 1656 02/02/20 2005  BP: 110/65 101/61  Pulse: (!) 115 91  Resp: 17 16  Temp: 98.7 F (37.1 C)   SpO2: 100% 98%    Constitutional: Alert and oriented.  Eyes: Conjunctivae are normal.  Head: Atraumatic. Nose: No congestion/rhinnorhea. Mouth/Throat: Mucous membranes are moist.   Neck: No stridor. Painless ROM.  Cardiovascular: Normal rate, regular rhythm. Grossly normal heart sounds.  Good peripheral circulation. Respiratory: Normal respiratory effort.  No retractions. Lungs CTAB. Gastrointestinal: Soft and nontender. No distention. No abdominal bruits. No CVA tenderness. Genitourinary:  Musculoskeletal: No lower extremity tenderness nor edema.  No joint effusions. Neurologic:  Normal speech and language. No gross focal neurologic deficits are appreciated. No facial droop Skin:  Skin is warm, dry and intact. Superficial abrasion to right lateral wrist Psychiatric: calm and cooperative  ____________________________________________   LABS (all labs ordered are listed, but only abnormal results are displayed)  Results for orders placed or performed during the hospital encounter of 02/02/20 (from the past 24 hour(s))  CBC with Differential/Platelet     Status: Abnormal   Collection Time: 02/02/20  4:47 PM  Result Value Ref Range   WBC 10.9 (H) 4.0 - 10.5 K/uL   RBC 4.82 3.87 - 5.11 MIL/uL   Hemoglobin 14.4 12.0 - 15.0 g/dL   HCT 33.5 45.6 - 25.6 %   MCV 93.6 80.0 - 100.0 fL   MCH 29.9 26.0 - 34.0 pg   MCHC 31.9 30.0 - 36.0 g/dL   RDW 38.9 37.3 - 42.8 %   Platelets 175 150 - 400 K/uL    nRBC 0.0 0.0 - 0.2 %   Neutrophils Relative % 87 %   Neutro Abs 9.5 (H) 1.7 - 7.7 K/uL   Lymphocytes Relative 8 %   Lymphs Abs 0.8 0.7 - 4.0 K/uL   Monocytes Relative 4 %   Monocytes Absolute 0.4 0.1 - 1.0 K/uL   Eosinophils Relative 0 %   Eosinophils Absolute 0.0 0.0 - 0.5 K/uL   Basophils Relative 0 %   Basophils Absolute 0.0 0.0 - 0.1 K/uL   Immature Granulocytes 1 %   Abs Immature Granulocytes 0.06 0.00 - 0.07 K/uL  Comprehensive metabolic panel     Status: Abnormal   Collection Time: 02/02/20  4:47 PM  Result Value Ref Range   Sodium 141 135 - 145 mmol/L   Potassium 3.0 (L) 3.5 - 5.1 mmol/L   Chloride 105 98 - 111 mmol/L   CO2 19 (L) 22 - 32 mmol/L   Glucose, Bld 100 (H) 70 - 99 mg/dL   BUN 16 6 - 20 mg/dL   Creatinine, Ser 7.68 (H) 0.44 - 1.00 mg/dL   Calcium 9.6 8.9 - 11.5 mg/dL  Total Protein 8.4 (H) 6.5 - 8.1 g/dL   Albumin 4.7 3.5 - 5.0 g/dL   AST 37 15 - 41 U/L   ALT 12 0 - 44 U/L   Alkaline Phosphatase 51 38 - 126 U/L   Total Bilirubin 1.2 0.3 - 1.2 mg/dL   GFR calc non Af Amer 51 (L) >60 mL/min   GFR calc Af Amer 59 (L) >60 mL/min   Anion gap 17 (H) 5 - 15   ____________________________________________   ______________ ______________________________  RADIOLOGY   ____________________________________________   PROCEDURES  Procedure(s) performed:  Procedures    Critical Care performed: no ____________________________________________   INITIAL IMPRESSION / ASSESSMENT AND PLAN / ED COURSE  Pertinent labs & imaging results that were available during my care of the patient were reviewed by me and considered in my medical decision making (see chart for details).   DDX: Psychosis, delirium, medication effect, noncompliance, polysubstance abuse, Si, Hi, depression   [name] Karlye Ihrig is a 29 y.o. who presents to the ED with for evaluation of agitation.  Patient has psych history of schizophrenia.  Laboratory testing was ordered to evaluation for  underlying electrolyte derangement or signs of underlying organic pathology to explain today's presentation.  Based on history and physical and laboratory evaluation, it appears that the patient's presentation is 2/2 underlying psychiatric disorder and will require further evaluation and management by inpatient psychiatry.  Patient was  made an IVC due to agitation and bizarre behavior.  Disposition pending psychiatric evaluation.   Clinical Course as of Feb 01 2111  Thu Feb 02, 2020  2110 Was called to reassess patient.  She was complaining of eye discomfort and is tearing from both eyes.  No report of any chemical sprayed on her.  No report of any trauma.  Woods lamp exam is benign.  pH is normal.  Globes feels soft.  Extraocular motions are intact.  No conjunctival injection.   [PR]    Clinical Course User Index [PR] Willy Eddy, MD    The patient was evaluated in Emergency Department today for the symptoms described in the history of present illness. He/she was evaluated in the context of the global COVID-19 pandemic, which necessitated consideration that the patient might be at risk for infection with the SARS-CoV-2 virus that causes COVID-19. Institutional protocols and algorithms that pertain to the evaluation of patients at risk for COVID-19 are in a state of rapid change based on information released by regulatory bodies including the CDC and federal and state organizations. These policies and algorithms were followed during the patient's care in the ED.  As part of my medical decision making, I reviewed the following data within the electronic MEDICAL RECORD NUMBER Nursing notes reviewed and incorporated, Labs reviewed, notes from prior ED visits and [place] Controlled Substance Database   ____________________________________________   FINAL CLINICAL IMPRESSION(S) / ED DIAGNOSES  Final diagnoses:  Agitation      NEW MEDICATIONS STARTED DURING THIS VISIT:  New Prescriptions   No  medications on file     Note:  This document was prepared using Dragon voice recognition software and may include unintentional dictation errors.    Willy Eddy, MD 02/02/20 2045    Willy Eddy, MD 02/02/20 2112

## 2020-02-02 NOTE — BH Assessment (Signed)
Patient unable to participate in assessment, due to the effects of the IM medications.

## 2020-02-03 MED ORDER — TETRACAINE HCL 0.5 % OP SOLN
1.0000 [drp] | Freq: Once | OPHTHALMIC | Status: AC
  Administered 2020-02-03: 1 [drp] via OPHTHALMIC
  Filled 2020-02-03: qty 4

## 2020-02-03 MED ORDER — NAPHAZOLINE-GLYCERIN 0.012-0.2 % OP SOLN: 1.0000 [drp] | Freq: Four times a day (QID) | OPHTHALMIC | 0 refills | Status: DC | PRN

## 2020-02-03 MED ORDER — BENZTROPINE MESYLATE 1 MG/ML IJ SOLN
0.5000 mg | Freq: Once | INTRAMUSCULAR | Status: DC
  Filled 2020-02-03: qty 0.5

## 2020-02-03 MED ORDER — BENZTROPINE MESYLATE 1 MG PO TABS
0.5000 mg | ORAL_TABLET | Freq: Once | ORAL | Status: AC
  Administered 2020-02-03: 0.5 mg via ORAL
  Filled 2020-02-03: qty 1

## 2020-02-03 MED ORDER — BENZTROPINE MESYLATE 1 MG/ML IJ SOLN
1.0000 mg | Freq: Once | INTRAMUSCULAR | Status: AC
  Administered 2020-02-03: 1 mg via INTRAMUSCULAR
  Filled 2020-02-03: qty 1

## 2020-02-03 NOTE — ED Notes (Signed)
Psych at bedside.

## 2020-02-03 NOTE — ED Notes (Signed)
Patient was given her belongings. This writer went through the belongings bag with the patient and found her $50 bill in a specimen container. Patient put money in container in her shirt pocket of blue scrubs. Patient changed into blue scrubs and given socks and flip-flops to leave in.

## 2020-02-03 NOTE — BH Assessment (Signed)
Assessment Note  Bridget Carpenter is an 29 y.o. female presenting to [facility] ED under IVC by police. Per triage note Pt ems with [employer] with IVC paperwork. Per ems pt ran into moms house without clothes and jumped into shower. Mom called [employer]. Pt combative with ems and was given 5 mg haldol IM in route. Pt arrived in restraints, was compliant with ed staff and restraints removed. According to EMS patient was displaying very abnormal bizarre behavior according to EMS.  Did require restraints and became acutely agitated with police and EMS requiring IM Haldol.  Per EDP evaluation yesterday evening 02/02/20  patient currently calm but does appear more sedated after receiving Haldol.  She denies any SI or HI.  No hallucinations.During assessment nursing was in patient's room refusing to allow staff to obtain a COVID sample. Patient continued to yell "I can't see my eyes I can't keep them open." Per patient's nurse pt. States that she is unable to see.  Pt. Has eyes closed.  When pt. Opens eyes pt. Has excess tears. Patient reported to Psychiatry staff that she is trying to get help for her mental health and reported that her mother called the police on her. Patient denied SI/HI/AH/VH.    Per Psyc NP patient will be observed overnight and reassessed in the morning.   Diagnosis: Borderline Personality Disorder  Past Medical History:  Past Medical History:  Diagnosis Date  . Asthma   . Depression 2009   Inpatient psych admission for SI, dissociative fugue  . Dissociative disorder or reaction 2009  . Eczema   . H/O: suicide attempt   . ODD (oppositional defiant disorder)   . PTSD (post-traumatic stress disorder)   . Schizoaffective disorder ([place])   . Substance abuse [facility])     Past Surgical History:  Procedure Laterality Date  . ADENOIDECTOMY    . TONSILLECTOMY      Family History:  Family History  Adopted: Yes  Problem Relation Age of Onset  . Mental illness Mother   . Mental illness Father    . Heart disease Maternal Grandmother   . Sickle cell trait Adoptive Mother   . Cleft palate Cousin        maternal side    Social History:  reports that she has quit smoking. Her smoking use included cigarettes. She has a 1.50 pack-year smoking history. She has never used smokeless tobacco. She reports previous alcohol use of about 1.0 standard drinks of alcohol per week. She reports previous drug use. Drugs: Marijuana, "Crack" cocaine, and Heroin.  Additional Social History:  Alcohol / Drug Use Pain Medications: See MAR Prescriptions: See MAR Over the Counter: See MAR History of alcohol / drug use?: No history of alcohol / drug abuse  CIWA: CIWA-Ar BP: 101/61 Pulse Rate: 91 COWS:    Allergies:  Allergies  Allergen Reactions  . Banana Hives and Swelling  . Cantaloupe (Diagnostic) Hives  . Grapefruit Extract Itching and Swelling  . Albumin Human Other (See Comments)    Pt refuses all blood products.     Home Medications: (Not in a hospital admission)   OB/GYN Status:  No LMP recorded.  General Assessment Data Assessment unable to be completed: Yes Reason for not completing assessment: Patient unable to participate in assessment, due to the effects of the IM medications. Location of Assessment: [facility] ED TTS Assessment: In system Is this a Tele or Face-to-Face Assessment?: Face-to-Face Is this an Initial Assessment or a Re-assessment for this encounter?: Initial Assessment  Patient Accompanied by:: N/A Language Other than English: No Living Arrangements: Homeless/Shelter What gender do you identify as?: Female Marital status: Single Living Arrangements: Other (Comment)(Homeless) Can pt return to current living arrangement?: Yes Admission Status: Involuntary Petitioner: Police Is patient capable of signing voluntary admission?: No Referral Source: Other Insurance type: Medicaid  Medical Screening Exam [facility] Walk-in ONLY) Medical Exam completed: Yes  Crisis Care  Plan Living Arrangements: Other (Comment)(Homeless) Legal Guardian: Other:(Self) Name of Psychiatrist: Unknown Name of Therapist: Unknown  Education Status Is patient currently in school?: No Is the patient employed, unemployed or receiving disability?: (Unknown)  Risk to self with the past 6 months Suicidal Ideation: No Has patient been a risk to self within the past 6 months prior to admission? : No Suicidal Intent: No Has patient had any suicidal intent within the past 6 months prior to admission? : No Is patient at risk for suicide?: Yes Suicidal Plan?: No Has patient had any suicidal plan within the past 6 months prior to admission? : No Access to Means: No What has been your use of drugs/alcohol within the last 12 months?: Unknown at this time Previous Attempts/Gestures: Yes How many times?: 1 Intentional Self Injurious Behavior: None Family Suicide History: No Recent stressful life event(s): Other (Comment)(Currently homeless) Persecutory voices/beliefs?: No Depression: No Substance abuse history and/or treatment for substance abuse?: No Suicide prevention information given to non-admitted patients: Not applicable  Risk to Others within the past 6 months Homicidal Ideation: No Does patient have any lifetime risk of violence toward others beyond the six months prior to admission? : No Thoughts of Harm to Others: No Current Homicidal Intent: No Current Homicidal Plan: No Access to Homicidal Means: No History of harm to others?: No Assessment of Violence: None Noted Does patient have access to weapons?: No Criminal Charges Pending?: No Does patient have a court date: No Is patient on probation?: No  Psychosis Hallucinations: None noted Delusions: None noted  Mental Status Report Appearance/Hygiene: In scrubs Eye Contact: Poor Motor Activity: Freedom of movement, Agitation Speech: Loud, Aggressive Level of Consciousness: Alert Mood: Anxious, Angry Affect:  Anxious, Angry, Irritable Anxiety Level: Moderate Thought Processes: Coherent Judgement: Unimpaired Orientation: Person, Place, Time, Situation, Appropriate for developmental age Obsessive Compulsive Thoughts/Behaviors: None  Cognitive Functioning Concentration: Normal Memory: Recent Intact, Remote Intact Is patient IDD: No Insight: Poor Impulse Control: Poor Appetite: Fair Have you had any weight changes? : No Change Sleep: Decreased Total Hours of Sleep: 3 Vegetative Symptoms: None  ADLScreening [facility] Assessment Services) Patient's cognitive ability adequate to safely complete daily activities?: Yes Patient able to express need for assistance with ADLs?: Yes Independently performs ADLs?: Yes (appropriate for developmental age)  Prior Inpatient Therapy Prior Inpatient Therapy: Yes Prior Therapy Dates: 12/04/2018,09/10/2018,05/03/2018,07/16/2017 Prior Therapy Facilty/Provider(s): [facility] Reason for Treatment: Depression, Intentional Overdose  Prior Outpatient Therapy Prior Outpatient Therapy: (Unknown)  ADL Screening (condition at time of admission) Patient's cognitive ability adequate to safely complete daily activities?: Yes Is the patient deaf or have difficulty hearing?: No Does the patient have difficulty seeing, even when wearing glasses/contacts?: No Does the patient have difficulty concentrating, remembering, or making decisions?: No Patient able to express need for assistance with ADLs?: Yes Does the patient have difficulty dressing or bathing?: No Independently performs ADLs?: Yes (appropriate for developmental age) Does the patient have difficulty walking or climbing stairs?: No Weakness of Legs: None Weakness of Arms/Hands: None  Home Assistive Devices/Equipment Home Assistive Devices/Equipment: None  Therapy Consults (therapy consults require a physician  order) PT Evaluation Needed: No OT Evalulation Needed: No SLP Evaluation Needed: No Abuse/Neglect  Assessment (Assessment to be complete while patient is alone) Abuse/Neglect Assessment Can Be Completed: Yes Physical Abuse: Denies Verbal Abuse: Denies Sexual Abuse: Denies Exploitation of patient/patient's resources: Denies Self-Neglect: Denies Values / Beliefs Cultural Requests During Hospitalization: None Spiritual Requests During Hospitalization: None Consults Spiritual Care Consult Needed: No Transition of Care Team Consult Needed: No Advance Directives (For Healthcare) Does Patient Have a Medical Advance Directive?: No          Disposition: Per Psyc NP patient will be observed overnight and reassessed in the morning.  Disposition Initial Assessment Completed for this Encounter: Yes  On Site Evaluation by:   Reviewed with Physician:    Benay Pike MS LCASA 02/03/2020 1:00 AM

## 2020-02-03 NOTE — ED Notes (Signed)
Called number in chart and person stating they do not know this patient. Asked pt for number of someone to call to pick her up. Pt states she has no one. Pt states she can't see to get around. Pt does have eye drops ordered. Will let next RN know.

## 2020-02-03 NOTE — ED Notes (Signed)
Pt sitting up eating at this time.

## 2020-02-03 NOTE — ED Notes (Signed)
Patient is using Ascom phone without difficulty.

## 2020-02-03 NOTE — Consult Note (Signed)
[facility] Face-to-Face Psychiatry Consult   Reason for Consult: Psychiatric Evaluation  Referring Physician: Dr. Roxan Hockey Patient Identification: Bridget Carpenter MRN:  [id number] Principal Diagnosis: <principal problem not specified> Diagnosis:  Active Problems:   Tobacco use disorder   Asthma   Borderline personality disorder (HCC)   Cocaine use disorder, severe, dependence (HCC)   Cannabis use disorder, moderate, dependence (HCC)   Alcohol use disorder, mild, abuse   Eczema   Overdose   Aspiration pneumonia (HCC)   Acute respiratory failure (HCC)   Polysubstance abuse (HCC)   MRSA carrier   PTSD (post-traumatic stress disorder)   Bacterial vaginosis   Psychoactive substance-induced psychosis (HCC)   Cocaine abuse with cocaine-induced mood disorder (HCC)   Supervision of high risk pregnancy, antepartum   Club foot, fetal, affecting care of mother, antepartum   Decreased fetal movement   Homelessness   Labor and delivery indication for care or intervention   Rubella non-immune status, antepartum   Pain of round ligament affecting pregnancy, antepartum   Indication for care in labor or delivery   Dehydration   Indication for care in labor and delivery, antepartum   Hydrops fetalis   MDD (major depressive disorder), recurrent episode, severe (HCC)   Adjustment disorder   Total Time spent with patient: 30 minutes  Subjective: " I am here because I refused to give my mom money and she called the cops on me." Bridget Carpenter is a 29 y.o. female patient presented to [facility] ED via law enforcement under involuntary commitment status (IVC).  Per the ED triage nursing note, the patient ran into her mother's house without clothes on and jumped into the shower.  The patient's mother called the police.  The patient was combative with EMS and was given 5 mg Haldol IM en route.  It was reported that the patient arrived in restraints.  The patient was seen in her room yelling, stating  her eyes hurt.  She voiced she's here because of her eyes.  "I cannot see out of my eyes."  The patient discussed that she received her disability check today (02/02/20) and would not give her mother money.  She voiced her mom became upset and called the cops to bring her to the hospital.  The patient was seen face-to-face by this provider; chart reviewed and consulted with Dr. Roxan Hockey on 02/02/2020 due to the patient's care. It was discussed with the EDP that the patient would be observed overnight and reassess in the a.m. to determine if she meets the criteria for psychiatric inpatient admission or could be discharged back home. The patient is alert and oriented x3, verbally aggressive, screaming and un-cooperative, and mood-congruent with affect on evaluation. The patient does not appear to be responding to internal or external stimuli. Neither is the patient presenting with any delusional thinking. The patient denies auditory or visual hallucinations. The patient denies suicidal, homicidal, or self-harm ideations. The patient is not presenting with any psychotic or paranoid behaviors. During an encounter with the patient, she refused to answer questions.  Plan:  The patient will be observed overnight and reassess in the a.m. to determine if she meets criteria for psychiatric inpatient admission or can be discharged back home.  HPI: Per Dr. Roxan Hockey; Bridget Carpenter is a 29 y.o. female the below listed past medical history presents to the ER for acute agitation.  Reportedly ran into her mother's house naked and jumped in the shower.  She is not supposed to be there.  Is  displaying very abnormal bizarre behavior according to EMS.  Did require restraints and became acutely agitated with police and EMS requiring IM Haldol.  Patient currently calm but does appear more sedated after receiving Haldol.  She denies any SI or HI.  No hallucinations.  Past Psychiatric History:  Depression Dissociative  disorder or reaction H/O: Suicide attempt ODD (oppositional defiant disorder) PTSD (post traumatic stress disorder) Schizoaffective disorder (HCC) Substance abuse (HCC)  Risk to Self: Suicidal Ideation: No Suicidal Intent: No Is patient at risk for suicide?: Yes Suicidal Plan?: No Access to Means: No What has been your use of drugs/alcohol within the last 12 months?: Unknown at this time How many times?: 1 Intentional Self Injurious Behavior: None Risk to Others: Homicidal Ideation: No Thoughts of Harm to Others: No Current Homicidal Intent: No Current Homicidal Plan: No Access to Homicidal Means: No History of harm to others?: No Assessment of Violence: None Noted Does patient have access to weapons?: No Criminal Charges Pending?: No Does patient have a court date: No Prior Inpatient Therapy: Prior Inpatient Therapy: Yes Prior Therapy Dates: 12/04/2018,09/10/2018,05/03/2018,07/16/2017 Prior Therapy Facilty/Provider(s): [facility] Reason for Treatment: Depression, Intentional Overdose Prior Outpatient Therapy: Prior Outpatient Therapy: (Unknown)  Past Medical History:  Past Medical History:  Diagnosis Date  . Asthma   . Depression 2009   Inpatient psych admission for SI, dissociative fugue  . Dissociative disorder or reaction 2009  . Eczema   . H/O: suicide attempt   . ODD (oppositional defiant disorder)   . PTSD (post-traumatic stress disorder)   . Schizoaffective disorder (HCC)   . Substance abuse [facility])     Past Surgical History:  Procedure Laterality Date  . ADENOIDECTOMY    . TONSILLECTOMY     Family History:  Family History  Adopted: Yes  Problem Relation Age of Onset  . Mental illness Mother   . Mental illness Father   . Heart disease Maternal Grandmother   . Sickle cell trait Adoptive Mother   . Cleft palate Cousin        maternal side   Family Psychiatric  History:  Social History:  Social History   Substance and Sexual Activity  Alcohol Use Not  Currently  . Alcohol/week: 1.0 standard drinks  . Types: 1 Cans of beer per week     Social History   Substance and Sexual Activity  Drug Use Not Currently  . Types: Marijuana, "Crack" cocaine, Heroin    Social History   Socioeconomic History  . Marital status: Single    Spouse name: Not on file  . Number of children: 1  . Years of education: Not on file  . Highest education level: Not on file  Occupational History  . Not on file  Tobacco Use  . Smoking status: Former Smoker    Packs/day: 0.75    Years: 2.00    Pack years: 1.50    Types: Cigarettes  . Smokeless tobacco: Never Used  . Tobacco comment: Patient states she quit smoking "a long time ago"  Substance and Sexual Activity  . Alcohol use: Not Currently    Alcohol/week: 1.0 standard drinks    Types: 1 Cans of beer per week  . Drug use: Not Currently    Types: Marijuana, "Crack" cocaine, Heroin  . Sexual activity: Not Currently    Partners: Male  Other Topics Concern  . Not on file  Social History Narrative   Is homeless   Her mother is her daughter's gaurdian   Multiple psychiatric  hospitalizations, not on any medication currently   Substance use disorder   Social Determinants of Health   Financial Resource Strain: High Risk  . Difficulty of Paying Living Expenses: Very hard  Food Insecurity: Food Insecurity Present  . Worried About Charity fundraiser in the Last Year: Often true  . Ran Out of Food in the Last Year: Often true  Transportation Needs: No Transportation Needs  . Lack of Transportation (Medical): No  . Lack of Transportation (Non-Medical): No  Physical Activity: Sufficiently Active  . Days of Exercise per Week: 4 days  . Minutes of Exercise per Session: 40 min  Stress: Stress Concern Present  . Feeling of Stress : Very much  Social Connections: Severely Isolated  . Frequency of Communication with Friends and Family: Never  . Frequency of Social Gatherings with Friends and Family: Never   . Attends Religious Services: Never  . Active Member of Clubs or Organizations: No  . Attends Archivist Meetings: Never  . Marital Status: Never married   Additional Social History:    Allergies:   Allergies  Allergen Reactions  . Banana Hives and Swelling  . Cantaloupe (Diagnostic) Hives  . Grapefruit Extract Itching and Swelling  . Albumin Human Other (See Comments)    Pt refuses all blood products.     Labs:  Results for orders placed or performed during the hospital encounter of 02/02/20 (from the past 48 hour(s))  CBC with Differential/Platelet     Status: Abnormal   Collection Time: 02/02/20  4:47 PM  Result Value Ref Range   WBC 10.9 (H) 4.0 - 10.5 K/uL   RBC 4.82 3.87 - 5.11 MIL/uL   Hemoglobin 14.4 12.0 - 15.0 g/dL   HCT 45.1 36.0 - 46.0 %   MCV 93.6 80.0 - 100.0 fL   MCH 29.9 26.0 - 34.0 pg   MCHC 31.9 30.0 - 36.0 g/dL   RDW 12.8 11.5 - 15.5 %   Platelets 175 150 - 400 K/uL   nRBC 0.0 0.0 - 0.2 %   Neutrophils Relative % 87 %   Neutro Abs 9.5 (H) 1.7 - 7.7 K/uL   Lymphocytes Relative 8 %   Lymphs Abs 0.8 0.7 - 4.0 K/uL   Monocytes Relative 4 %   Monocytes Absolute 0.4 0.1 - 1.0 K/uL   Eosinophils Relative 0 %   Eosinophils Absolute 0.0 0.0 - 0.5 K/uL   Basophils Relative 0 %   Basophils Absolute 0.0 0.0 - 0.1 K/uL   Immature Granulocytes 1 %   Abs Immature Granulocytes 0.06 0.00 - 0.07 K/uL    Comment: Performed at [facility], [address] [place], [place] [address]  Comprehensive metabolic panel     Status: Abnormal   Collection Time: 02/02/20  4:47 PM  Result Value Ref Range   Sodium 141 135 - 145 mmol/L   Potassium 3.0 (L) 3.5 - 5.1 mmol/L   Chloride 105 98 - 111 mmol/L   CO2 19 (L) 22 - 32 mmol/L   Glucose, Bld 100 (H) 70 - 99 mg/dL   BUN 16 6 - 20 mg/dL   Creatinine, Ser 1.40 (H) 0.44 - 1.00 mg/dL   Calcium 9.6 8.9 - 10.3 mg/dL   Total Protein 8.4 (H) 6.5 - 8.1 g/dL   Albumin 4.7 3.5 - 5.0 g/dL   AST 37 15 - 41 U/L    ALT 12 0 - 44 U/L   Alkaline Phosphatase 51 38 - 126 U/L  Total Bilirubin 1.2 0.3 - 1.2 mg/dL   GFR calc non Af Amer 51 (L) >60 mL/min   GFR calc Af Amer 59 (L) >60 mL/min   Anion gap 17 (H) 5 - 15    Comment: Performed at [facility], [address] [place], [place] [address]    Current Facility-Administered Medications  Medication Dose Route Frequency Provider Last Rate Last Admin  . naphazoline-glycerin (CLEAR EYES REDNESS) ophth solution 2 drop  2 drop Both Eyes QID PRN Willy Eddy, MD       Current Outpatient Medications  Medication Sig Dispense Refill  . diphenhydrAMINE (BENADRYL) 25 mg capsule Take 1 capsule (25 mg total) by mouth every 4 (four) hours as needed. 10 capsule 0  . doxycycline (VIBRA-TABS) 100 MG tablet Take 1 tablet (100 mg total) by mouth 2 (two) times daily. (Patient not taking: Reported on 11/04/2019) 20 tablet 0    Musculoskeletal: Strength & Muscle Tone: within normal limits Gait & Station: normal Patient leans: N/A  Psychiatric Specialty Exam: Physical Exam  Nursing note and vitals reviewed. Constitutional: She appears well-developed and well-nourished.  Cardiovascular: Normal rate.  Respiratory: Effort normal.  Musculoskeletal:        General: Normal range of motion.     Cervical back: Normal range of motion and neck supple.    Review of Systems  Psychiatric/Behavioral: Positive for agitation, behavioral problems and sleep disturbance.  All other systems reviewed and are negative.   Blood pressure 101/61, pulse 91, temperature 98.7 F (37.1 C), temperature source Oral, resp. rate 16, height 5\' 7"  (1.702 m), weight 59 kg, SpO2 98 %, unknown if currently breastfeeding.Body mass index is 20.36 kg/m.  General Appearance: Disheveled  Eye Contact:  Minimal  Speech:  Clear and Coherent  Volume:  Increased  Mood:  Angry and Anxious  Affect:  Congruent  Thought Process:  Coherent  Orientation:  Full (Time, Place, and Person)   Thought Content:  Logical  Suicidal Thoughts:  No  Homicidal Thoughts:  No  Memory:  Immediate;   Good Recent;   Good Remote;   Good  Judgement:  Fair  Insight:  Shallow  Psychomotor Activity:  Normal  Concentration:  Concentration: Fair and Attention Span: Fair  Recall:  of Knowledge:  Fair  Language:  Good  Akathisia:  Negative  Handed:  Right  AIMS (if indicated):     Assets:  Communication Skills Desire for Improvement Social Support  ADL's:  Intact  Cognition:  WNL  Sleep:        Treatment Plan Summary: Daily contact with patient to assess and evaluate symptoms and progress in treatment, Medication management and Plan The patient will be observed overnight and reassess in the a.m. to determine if she meets criteria for psychiatric inpatient admission.  Disposition: Supportive therapy provided about ongoing stressors. The patient will remain under observation overnight and reassess in the a.m. to determine if she meets criteria for psychiatric inpatient admission.  [employer], NP 02/03/2020 1:07 AM

## 2020-02-03 NOTE — ED Notes (Signed)
Patient ambulated to bathroom with one person assist to void for pregnancy test. A hat was placed in the toilet. Patient states she was unable to void. Dr.Siadecki aware. Patient denies being pregnant, states she "had my cycle and have not had sex." Dr. Marisa Severin aware and states patient can have Cogentin one dose of 1 mg IM.

## 2020-02-03 NOTE — ED Provider Notes (Signed)
-----------------------------------------   5:24 AM on 02/03/2020 -----------------------------------------   Blood pressure 101/61, pulse 91, temperature 98.7 F (37.1 C), temperature source Oral, resp. rate 16, height 1.702 m (5\' 7" ), weight 59 kg, SpO2 98 %, unknown if currently breastfeeding.  The patient is calm and cooperative at this time.  There have been no acute events since the last update.  Awaiting disposition plan from Behavioral Medicine and/or Social Work team(s).   , MD 02/03/20 [phone]

## 2020-02-03 NOTE — ED Notes (Signed)
Bus pass obtained from charge nurse. Patient states she can't see and wants to have an MD evaluate her or a referral to an "eye doctor." Patient agreed to leave after having an eye exam, either to the lobby to call for a ride or use the bus pass.

## 2020-02-03 NOTE — Discharge Instructions (Addendum)
You may use the prescribed eyedrops.  Follow-up with your regular doctor.  Return to the ER for any new or worsening symptoms that concern you.

## 2020-02-03 NOTE — BH Assessment (Signed)
Late Entry- Patient was seen for an update/reassessment. Patient denies SI/HI and AV/H.

## 2020-02-03 NOTE — ED Notes (Signed)
Patient able to ambulate to [place] restroom independently.

## 2020-02-03 NOTE — ED Provider Notes (Addendum)
-----------------------------------------   10:33 AM on 02/03/2020 -----------------------------------------  The patient has been evaluated by psychiatry.  Dr. Cindi Carbon has rescinded the IVC and advises that the patient is stable for discharge.  Return precautions have been provided.   ----------------------------------------- 1:07 PM on 02/03/2020 -----------------------------------------  The patient reports continued discomfort and tearing to both eyes.  She states that she is having difficulty opening her eyes or keeping them open without helping with her hands.  I see that Dr. Roxan Hockey did perform an eye exam earlier and found no notable findings.  On my exam currently, the patient's conjunctiva appear mildly injected.  She is having difficulty opening the eyes and also reports some tightness in her face and jaw.  She is speaking clearly.  Her extraocular movements are intact.  Her pupils are reactive.  There are no other acute abnormalities.  I understand that the patient received Haldol by EMS and her presentation seems more consistent with a dystonic reaction than an actual ocular issue, so I gave a dose of Cogentin and will reassess.   ----------------------------------------- 2:53 PM on 02/03/2020 -----------------------------------------  The patient states that her eyes have improved somewhat after the Cogentin, but she feels like she needs a second dose.  I will give an additional 0.5 mg.  However, the patient was observed by staff ambulating to the bathroom and back to her bed without any apparent difficulty and does not have any blurred vision or difficulty seeing.  At this time, there is no evidence of acute ocular issue.  The patient is stable for discharge.  I will give her ophthalmology referral.  Return precautions given, and she expressed understanding.   Dionne Bucy, MD 02/03/20 [id number]

## 2020-02-03 NOTE — ED Notes (Signed)
Dr. Siadecki at bedside 

## 2020-02-03 NOTE — Consult Note (Signed)
[facility] Face-to-Face Psychiatry Consult   Reason for Consult: Psychiatric Evaluation  Referring Physician: Dr. Roxan Hockey Patient Identification: Bridget Carpenter [name] MRN:  [id number] Principal Diagnosis: <principal problem not specified> Diagnosis:  Active Problems:   Tobacco use disorder   Asthma   Borderline personality disorder (HCC)   Cocaine use disorder, severe, dependence (HCC)   Cannabis use disorder, moderate, dependence (HCC)   Alcohol use disorder, mild, abuse   Eczema   Overdose   Aspiration pneumonia (HCC)   Acute respiratory failure (HCC)   Polysubstance abuse (HCC)   MRSA carrier   PTSD (post-traumatic stress disorder)   Bacterial vaginosis   Psychoactive substance-induced psychosis (HCC)   Cocaine abuse with cocaine-induced mood disorder (HCC)   Supervision of high risk pregnancy, antepartum   Club foot, fetal, affecting care of mother, antepartum   Decreased fetal movement   Homelessness   Labor and delivery indication for care or intervention   Rubella non-immune status, antepartum   Pain of round ligament affecting pregnancy, antepartum   Indication for care in labor or delivery   Dehydration   Indication for care in labor and delivery, antepartum   Hydrops fetalis   MDD (major depressive disorder), recurrent episode, severe (HCC)   Adjustment disorder    Patient reevaluated this morning.  She reports that she was upset last night due to an argument with her mother.  She denies feeling upset anymore.  She denies any suicidal homicidal ideation.  She denies any psychotic symptoms including paranoia hallucinations or manic mood.  Patient requesting to be discharged at this time she is not deemed a danger to herself or others and therefore will be discharged.  IVC rescinded.   NP note as follows "      Total Time spent with patient: 30 minutes  Subjective: " I am here because I refused to give my mom money and she called the cops on me." Bridget Carpenter Bridget Carpenter is a 29 y.o. female patient presented to [facility] ED via law enforcement under involuntary commitment status (IVC).  Per the ED triage nursing note, the patient ran into her mother's house without clothes on and jumped into the shower.  The patient's mother called the police.  The patient was combative with EMS and was given 5 mg Haldol IM en route.  It was reported that the patient arrived in restraints.  The patient was seen in her room yelling, stating her eyes hurt.  She voiced she's here because of her eyes.  "I cannot see out of my eyes."  The patient discussed that she received her disability check today (02/02/20) and would not give her mother money.  She voiced her mom became upset and called the cops to bring her to the hospital.  The patient was seen face-to-face by this provider; chart reviewed and consulted with Dr. Roxan Hockey on 02/02/2020 due to the patient's care. It was discussed with the EDP that the patient would be observed overnight and reassess in the a.m. to determine if she meets the criteria for psychiatric inpatient admission or could be discharged back home. The patient is alert and oriented x3, verbally aggressive, screaming and un-cooperative, and mood-congruent with affect on evaluation. The patient does not appear to be responding to internal or external stimuli. Neither is the patient presenting with any delusional thinking. The patient denies auditory or visual hallucinations. The patient denies suicidal, homicidal, or self-harm ideations. The patient is not presenting with any psychotic or paranoid behaviors. During an encounter with the  patient, she refused to answer questions.  Plan:  The patient will be observed overnight and reassess in the a.m. to determine if she meets criteria for psychiatric inpatient admission or can be discharged back home.  HPI: Per Dr. Roxan Hockey; Bridget Carpenter Bridget Carpenter is a 29 y.o. female the below listed past medical history presents to the ER for  acute agitation.  Reportedly ran into her mother's house naked and jumped in the shower.  She is not supposed to be there.  Is displaying very abnormal bizarre behavior according to EMS.  Did require restraints and became acutely agitated with police and EMS requiring IM Haldol.  Patient currently calm but does appear more sedated after receiving Haldol.  She denies any SI or HI.  No hallucinations.  Past Psychiatric History:  Depression Dissociative disorder or reaction H/O: Suicide attempt ODD (oppositional defiant disorder) PTSD (post traumatic stress disorder) Schizoaffective disorder (HCC) Substance abuse (HCC)  Risk to Self: Suicidal Ideation: No Suicidal Intent: No Is patient at risk for suicide?: Yes Suicidal Plan?: No Access to Means: No What has been your use of drugs/alcohol within the last 12 months?: Unknown at this time How many times?: 1 Intentional Self Injurious Behavior: None Risk to Others: Homicidal Ideation: No Thoughts of Harm to Others: No Current Homicidal Intent: No Current Homicidal Plan: No Access to Homicidal Means: No History of harm to others?: No Assessment of Violence: None Noted Does patient have access to weapons?: No Criminal Charges Pending?: No Does patient have a court date: No Prior Inpatient Therapy: Prior Inpatient Therapy: Yes Prior Therapy Dates: 12/04/2018,09/10/2018,05/03/2018,07/16/2017 Prior Therapy Facilty/Provider(s): [facility] Reason for Treatment: Depression, Intentional Overdose Prior Outpatient Therapy: Prior Outpatient Therapy: (Unknown)  Past Medical History:  Past Medical History:  Diagnosis Date  . Asthma   . Depression 2009   Inpatient psych admission for SI, dissociative fugue  . Dissociative disorder or reaction 2009  . Eczema   . H/O: suicide attempt   . ODD (oppositional defiant disorder)   . PTSD (post-traumatic stress disorder)   . Schizoaffective disorder (HCC)   . Substance abuse [facility])     Past Surgical  History:  Procedure Laterality Date  . ADENOIDECTOMY    . TONSILLECTOMY     Family History:  Family History  Adopted: Yes  Problem Relation Age of Onset  . Mental illness Mother   . Mental illness Father   . Heart disease Maternal Grandmother   . Sickle cell trait Adoptive Mother   . Cleft palate Cousin        maternal side   Family Psychiatric  History:  Social History:  Social History   Substance and Sexual Activity  Alcohol Use Not Currently  . Alcohol/week: 1.0 standard drinks  . Types: 1 Cans of beer per week     Social History   Substance and Sexual Activity  Drug Use Not Currently  . Types: Marijuana, "Crack" cocaine, Heroin    Social History   Socioeconomic History  . Marital status: Single    Spouse name: Not on file  . Number of children: 1  . Years of education: Not on file  . Highest education level: Not on file  Occupational History  . Not on file  Tobacco Use  . Smoking status: Former Smoker    Packs/day: 0.75    Years: 2.00    Pack years: 1.50    Types: Cigarettes  . Smokeless tobacco: Never Used  . Tobacco comment: Patient states she quit smoking "a  long time ago"  Substance and Sexual Activity  . Alcohol use: Not Currently    Alcohol/week: 1.0 standard drinks    Types: 1 Cans of beer per week  . Drug use: Not Currently    Types: Marijuana, "Crack" cocaine, Heroin  . Sexual activity: Not Currently    Partners: Male  Other Topics Concern  . Not on file  Social History Narrative   Is homeless   Her mother is her daughter's gaurdian   Multiple psychiatric hospitalizations, not on any medication currently   Substance use disorder   Social Determinants of Health   Financial Resource Strain: High Risk  . Difficulty of Paying Living Expenses: Very hard  Food Insecurity: Food Insecurity Present  . Worried About Charity fundraiser in the Last Year: Often true  . Ran Out of Food in the Last Year: Often true  Transportation Needs: No  Transportation Needs  . Lack of Transportation (Medical): No  . Lack of Transportation (Non-Medical): No  Physical Activity: Sufficiently Active  . Days of Exercise per Week: 4 days  . Minutes of Exercise per Session: 40 min  Stress: Stress Concern Present  . Feeling of Stress : Very much  Social Connections: Severely Isolated  . Frequency of Communication with Friends and Family: Never  . Frequency of Social Gatherings with Friends and Family: Never  . Attends Religious Services: Never  . Active Member of Clubs or Organizations: No  . Attends Archivist Meetings: Never  . Marital Status: Never married   Additional Social History:    Allergies:   Allergies  Allergen Reactions  . Banana Hives and Swelling  . Cantaloupe (Diagnostic) Hives  . Grapefruit Extract Itching and Swelling  . Albumin Human Other (See Comments)    Pt refuses all blood products.     Labs:  Results for orders placed or performed during the hospital encounter of 02/02/20 (from the past 48 hour(s))  CBC with Differential/Platelet     Status: Abnormal   Collection Time: 02/02/20  4:47 PM  Result Value Ref Range   WBC 10.9 (H) 4.0 - 10.5 K/uL   RBC 4.82 3.87 - 5.11 MIL/uL   Hemoglobin 14.4 12.0 - 15.0 g/dL   HCT 45.1 36.0 - 46.0 %   MCV 93.6 80.0 - 100.0 fL   MCH 29.9 26.0 - 34.0 pg   MCHC 31.9 30.0 - 36.0 g/dL   RDW 12.8 11.5 - 15.5 %   Platelets 175 150 - 400 K/uL   nRBC 0.0 0.0 - 0.2 %   Neutrophils Relative % 87 %   Neutro Abs 9.5 (H) 1.7 - 7.7 K/uL   Lymphocytes Relative 8 %   Lymphs Abs 0.8 0.7 - 4.0 K/uL   Monocytes Relative 4 %   Monocytes Absolute 0.4 0.1 - 1.0 K/uL   Eosinophils Relative 0 %   Eosinophils Absolute 0.0 0.0 - 0.5 K/uL   Basophils Relative 0 %   Basophils Absolute 0.0 0.0 - 0.1 K/uL   Immature Granulocytes 1 %   Abs Immature Granulocytes 0.06 0.00 - 0.07 K/uL    Comment: Performed at [facility], [address] [place], [place] [address]   Comprehensive metabolic panel     Status: Abnormal   Collection Time: 02/02/20  4:47 PM  Result Value Ref Range   Sodium 141 135 - 145 mmol/L   Potassium 3.0 (L) 3.5 - 5.1 mmol/L   Chloride 105 98 - 111 mmol/L   CO2 19 (L)  22 - 32 mmol/L   Glucose, Bld 100 (H) 70 - 99 mg/dL   BUN 16 6 - 20 mg/dL   Creatinine, Ser 0.25 (H) 0.44 - 1.00 mg/dL   Calcium 9.6 8.9 - 42.7 mg/dL   Total Protein 8.4 (H) 6.5 - 8.1 g/dL   Albumin 4.7 3.5 - 5.0 g/dL   AST 37 15 - 41 U/L   ALT 12 0 - 44 U/L   Alkaline Phosphatase 51 38 - 126 U/L   Total Bilirubin 1.2 0.3 - 1.2 mg/dL   GFR calc non Af Amer 51 (L) >60 mL/min   GFR calc Af Amer 59 (L) >60 mL/min   Anion gap 17 (H) 5 - 15    Comment: Performed at [facility], [address] [place], [place] [address]    Current Facility-Administered Medications  Medication Dose Route Frequency Provider Last Rate Last Admin  . naphazoline-glycerin (CLEAR EYES REDNESS) ophth solution 2 drop  2 drop Both Eyes QID PRN Willy Eddy, MD   2 drop at 02/03/20 6283   Current Outpatient Medications  Medication Sig Dispense Refill  . diphenhydrAMINE (BENADRYL) 25 mg capsule Take 1 capsule (25 mg total) by mouth every 4 (four) hours as needed. 10 capsule 0  . doxycycline (VIBRA-TABS) 100 MG tablet Take 1 tablet (100 mg total) by mouth 2 (two) times daily. (Patient not taking: Reported on 11/04/2019) 20 tablet 0  . naphazoline-glycerin (CLEAR EYES REDNESS) 0.012-0.2 % SOLN Place 1-2 drops into both eyes 4 (four) times daily as needed for eye irritation. 15 mL 0    Musculoskeletal: Strength & Muscle Tone: within normal limits Gait & Station: normal Patient leans: N/A  Psychiatric Specialty Exam: Physical Exam  Nursing note and vitals reviewed. Constitutional: She appears well-developed and well-nourished.  Cardiovascular: Normal rate.  Respiratory: Effort normal.  Musculoskeletal:        General: Normal range of motion.     Cervical back: Normal  range of motion and neck supple.    Review of Systems  Psychiatric/Behavioral: Positive for agitation, behavioral problems and sleep disturbance.  All other systems reviewed and are negative.   Blood pressure 101/61, pulse 91, temperature 98.7 F (37.1 C), temperature source Oral, resp. rate 16, height 5\' 7"  (1.702 m), weight 59 kg, SpO2 98 %, unknown if currently breastfeeding.Body mass index is 20.36 kg/m.  General Appearance: Disheveled  Eye Contact:  Minimal  Speech:  Clear and Coherent  Volume:  Increased  Mood:  Angry and Anxious  Affect:  Congruent  Thought Process:  Coherent  Orientation:  Full (Time, Place, and Person)  Thought Content:  Logical  Suicidal Thoughts:  No  Homicidal Thoughts:  No  Memory:  Immediate;   Good Recent;   Good Remote;   Good  Judgement:  Fair  Insight:  Shallow  Psychomotor Activity:  Normal  Concentration:  Concentration: Fair and Attention Span: Fair  Recall:  [employer] of Knowledge:  Fair  Language:  Good  Akathisia:  Negative  Handed:  Right  AIMS (if indicated):     Assets:  Communication Skills Desire for Improvement Social Support  ADL's:  Intact  Cognition:  WNL  Sleep:     "   Treatment Plan Summary: 29 year old female with history of schizophrenia and multiple ER visits presents to the ED with complaints of agitated mood.  Patient was allowed to rest overnight and is now no longer feeling agitated.  She is requesting to be discharged and denying any  psychiatric symptoms.  Patient's IVC will be rescinded and patient will be discharged.  Diagnosis: Schizophrenia  Disposition: Supportive therapy provided about ongoing stressors. The patient will remain under observation overnight and reassess in the a.m. to determine if she meets criteria for psychiatric inpatient admission. " Clement Sayres, MD 02/03/2020 11:26 AM

## 2020-02-03 NOTE — ED Notes (Signed)
Pt given breakfast tray. Pt still sleeping

## 2020-02-03 NOTE — ED Notes (Addendum)
Patient states she is  still unable to completely open her eyes. Dr. Marisa Severin at bedside.

## 2020-02-27 ENCOUNTER — Other Ambulatory Visit: Payer: Self-pay

## 2020-02-27 ENCOUNTER — Emergency Department
Admission: EM | Admit: 2020-02-27 | Discharge: 2020-02-28 | Disposition: A | Discharge status: Home / Self Care | Payer: Medicaid Other | Attending: Emergency Medicine | Admitting: Emergency Medicine

## 2020-02-27 DIAGNOSIS — F332 Major depressive disorder, recurrent severe without psychotic features: Secondary | ICD-10-CM | POA: Diagnosis present

## 2020-02-27 DIAGNOSIS — T50901A Poisoning by unspecified drugs, medicaments and biological substances, accidental (unintentional), initial encounter: Secondary | ICD-10-CM | POA: Diagnosis present

## 2020-02-27 DIAGNOSIS — Z22322 Carrier or suspected carrier of Methicillin resistant Staphylococcus aureus: Secondary | ICD-10-CM

## 2020-02-27 DIAGNOSIS — Z5900 Homelessness unspecified: Secondary | ICD-10-CM

## 2020-02-27 DIAGNOSIS — F1910 Other psychoactive substance abuse, uncomplicated: Secondary | ICD-10-CM

## 2020-02-27 DIAGNOSIS — F1410 Cocaine abuse, uncomplicated: Secondary | ICD-10-CM | POA: Insufficient documentation

## 2020-02-27 DIAGNOSIS — R441 Visual hallucinations: Secondary | ICD-10-CM | POA: Insufficient documentation

## 2020-02-27 DIAGNOSIS — O358XX0 Maternal care for other (suspected) fetal abnormality and damage, not applicable or unspecified: Secondary | ICD-10-CM | POA: Diagnosis present

## 2020-02-27 DIAGNOSIS — L309 Dermatitis, unspecified: Secondary | ICD-10-CM | POA: Diagnosis present

## 2020-02-27 DIAGNOSIS — F600 Paranoid personality disorder: Secondary | ICD-10-CM | POA: Insufficient documentation

## 2020-02-27 DIAGNOSIS — F603 Borderline personality disorder: Secondary | ICD-10-CM | POA: Insufficient documentation

## 2020-02-27 DIAGNOSIS — B9689 Other specified bacterial agents as the cause of diseases classified elsewhere: Secondary | ICD-10-CM | POA: Diagnosis present

## 2020-02-27 DIAGNOSIS — J9600 Acute respiratory failure, unspecified whether with hypoxia or hypercapnia: Secondary | ICD-10-CM | POA: Diagnosis present

## 2020-02-27 DIAGNOSIS — F1210 Cannabis abuse, uncomplicated: Secondary | ICD-10-CM | POA: Insufficient documentation

## 2020-02-27 DIAGNOSIS — F1010 Alcohol abuse, uncomplicated: Secondary | ICD-10-CM | POA: Diagnosis present

## 2020-02-27 DIAGNOSIS — O368190 Decreased fetal movements, unspecified trimester, not applicable or unspecified: Secondary | ICD-10-CM | POA: Diagnosis present

## 2020-02-27 DIAGNOSIS — Z20822 Contact with and (suspected) exposure to covid-19: Secondary | ICD-10-CM | POA: Insufficient documentation

## 2020-02-27 DIAGNOSIS — Z87891 Personal history of nicotine dependence: Secondary | ICD-10-CM | POA: Diagnosis not present

## 2020-02-27 DIAGNOSIS — R4182 Altered mental status, unspecified: Secondary | ICD-10-CM | POA: Insufficient documentation

## 2020-02-27 DIAGNOSIS — F1220 Cannabis dependence, uncomplicated: Secondary | ICD-10-CM | POA: Diagnosis present

## 2020-02-27 DIAGNOSIS — R440 Auditory hallucinations: Secondary | ICD-10-CM | POA: Insufficient documentation

## 2020-02-27 DIAGNOSIS — R443 Hallucinations, unspecified: Secondary | ICD-10-CM

## 2020-02-27 DIAGNOSIS — F4320 Adjustment disorder, unspecified: Secondary | ICD-10-CM | POA: Diagnosis present

## 2020-02-27 DIAGNOSIS — E860 Dehydration: Secondary | ICD-10-CM | POA: Diagnosis present

## 2020-02-27 DIAGNOSIS — J45909 Unspecified asthma, uncomplicated: Secondary | ICD-10-CM | POA: Diagnosis not present

## 2020-02-27 DIAGNOSIS — O35HXX0 Maternal care for other (suspected) fetal abnormality and damage, fetal lower extremities anomalies, not applicable or unspecified: Secondary | ICD-10-CM | POA: Diagnosis present

## 2020-02-27 DIAGNOSIS — F1420 Cocaine dependence, uncomplicated: Secondary | ICD-10-CM | POA: Diagnosis present

## 2020-02-27 DIAGNOSIS — J690 Pneumonitis due to inhalation of food and vomit: Secondary | ICD-10-CM | POA: Diagnosis present

## 2020-02-27 DIAGNOSIS — F259 Schizoaffective disorder, unspecified: Secondary | ICD-10-CM | POA: Diagnosis not present

## 2020-02-27 DIAGNOSIS — N760 Acute vaginitis: Secondary | ICD-10-CM | POA: Diagnosis present

## 2020-02-27 DIAGNOSIS — F1414 Cocaine abuse with cocaine-induced mood disorder: Secondary | ICD-10-CM | POA: Diagnosis present

## 2020-02-27 LAB — COMPREHENSIVE METABOLIC PANEL
ALT: 10 U/L (ref 0–44)
AST: 20 U/L (ref 15–41)
Albumin: 4.5 g/dL (ref 3.5–5.0)
Alkaline Phosphatase: 44 U/L (ref 38–126)
Anion gap: 8 (ref 5–15)
BUN: 13 mg/dL (ref 6–20)
CO2: 26 mmol/L (ref 22–32)
Calcium: 9.2 mg/dL (ref 8.9–10.3)
Chloride: 110 mmol/L (ref 98–111)
Creatinine, Ser: 1.05 mg/dL — ABNORMAL HIGH (ref 0.44–1.00)
GFR calc Af Amer: >60 mL/min (ref >60)
GFR calc non Af Amer: >60 mL/min (ref >60)
Glucose, Bld: 78 mg/dL (ref 70–99)
Potassium: 3.4 mmol/L — ABNORMAL LOW (ref 3.5–5.1)
Sodium: 144 mmol/L (ref 135–145)
Total Bilirubin: 1.9 mg/dL — ABNORMAL HIGH (ref 0.3–1.2)
Total Protein: 7.4 g/dL (ref 6.5–8.1)

## 2020-02-27 LAB — ACETAMINOPHEN LEVEL: Acetaminophen (Tylenol), Serum: <10 ug/mL — ABNORMAL LOW (ref 10–30)

## 2020-02-27 LAB — CBC WITH DIFFERENTIAL/PLATELET
Abs Immature Granulocytes: 0.02 10*3/uL (ref 0.00–0.07)
Basophils Absolute: 0 10*3/uL (ref 0.0–0.1)
Basophils Relative: 1 %
Eosinophils Absolute: 0.3 10*3/uL (ref 0.0–0.5)
Eosinophils Relative: 4 %
HCT: 39.8 % (ref 36.0–46.0)
Hemoglobin: 13 g/dL (ref 12.0–15.0)
Immature Granulocytes: 0 %
Lymphocytes Relative: 27 %
Lymphs Abs: 1.8 10*3/uL (ref 0.7–4.0)
MCH: 30.6 pg (ref 26.0–34.0)
MCHC: 32.7 g/dL (ref 30.0–36.0)
MCV: 93.6 fL (ref 80.0–100.0)
Monocytes Absolute: 0.6 10*3/uL (ref 0.1–1.0)
Monocytes Relative: 9 %
Neutro Abs: 3.9 10*3/uL (ref 1.7–7.7)
Neutrophils Relative %: 59 %
Platelets: 209 10*3/uL (ref 150–400)
RBC: 4.25 MIL/uL (ref 3.87–5.11)
RDW: 13.4 % (ref 11.5–15.5)
WBC: 6.6 10*3/uL (ref 4.0–10.5)
nRBC: 0 % (ref 0.0–0.2)

## 2020-02-27 LAB — SALICYLATE LEVEL: Salicylate Lvl: <7 mg/dL — ABNORMAL LOW (ref 7.0–30.0)

## 2020-02-27 LAB — RESPIRATORY PANEL BY RT PCR (FLU A&B, COVID)
Influenza A by PCR: NEGATIVE
Influenza B by PCR: NEGATIVE
SARS Coronavirus 2 by RT PCR: NEGATIVE

## 2020-02-27 LAB — ETHANOL: Alcohol, Ethyl (B): <10 mg/dL (ref <10)

## 2020-02-27 MED ORDER — HYDROXYZINE HCL 25 MG PO TABS
25.0000 mg | ORAL_TABLET | Freq: Once | ORAL | Status: AC
  Administered 2020-02-27: 25 mg via ORAL
  Filled 2020-02-27: qty 1

## 2020-02-27 NOTE — ED Triage Notes (Signed)
Patient arrived by [facility]. Per EMS [place] [employer] states patient having visual hallucinations seeing lions and bears. Patient very fidgety during triage scratching at right arm. Patient states she was seeing lions due to [employer] where they leave the earth. Patient states she is homeless and her belongings are everywhere.

## 2020-02-27 NOTE — ED Provider Notes (Signed)
[facility] Emergency Department Provider Note  ____________________________________________   First MD Initiated Contact with Patient 02/27/20 2045     (approximate)  I have reviewed the triage vital signs and the nursing notes.   HISTORY  Chief Complaint Altered Mental Status    HPI Bridget Carpenter is a 29 y.o. female  With PMHx ODD, schizoaffective d/o, here with reported hallucinations. She is somewhat evasive on exam. Reports she has been seeing "lions" and visual hallucinations, hearing voices. She has not been taking any medications. She states she feels dehydrated but is unable to tell me why, does admit she is homeless and unfortunately has not had much to eat/drink recently. Denies any complaints. Denies drug use.   Level 5 caveat invoked as remainder of history, ROS, and physical exam limited due to patient's psychiatric condition.         Past Medical History:  Diagnosis Date  . Asthma   . Depression 2009   Inpatient psych admission for SI, dissociative fugue  . Dissociative disorder or reaction 2009  . Eczema   . H/O: suicide attempt   . ODD (oppositional defiant disorder)   . PTSD (post-traumatic stress disorder)   . Schizoaffective disorder ([place])   . Substance abuse [facility])     Patient Active Problem List   Diagnosis Date Noted  . Adjustment disorder   . MDD (major depressive disorder), recurrent episode, severe ([place]) 08/31/2019  . Indication for care in labor and delivery, antepartum 08/29/2019  . Hydrops fetalis 08/29/2019  . Indication for care in labor or delivery 08/14/2019  . Dehydration 08/14/2019  . Rubella non-immune status, antepartum 08/01/2019  . Pain of round ligament affecting pregnancy, antepartum 08/01/2019  . Labor and delivery indication for care or intervention 07/30/2019  . Homelessness 07/25/2019  . Decreased fetal movement 07/02/2019  . Club foot, fetal, affecting care of mother, antepartum  06/29/2019  . Supervision of high risk pregnancy, antepartum 06/19/2019  . Cocaine abuse with cocaine-induced mood disorder ([place]) 04/15/2019  . Psychoactive substance-induced psychosis ([place]) 05/03/2018  . Bacterial vaginosis 12/25/2017  . PTSD (post-traumatic stress disorder) 09/02/2017  . MRSA carrier 07/18/2017  . Overdose 07/16/2017  . Aspiration pneumonia ([place]) 07/16/2017  . Acute respiratory failure ([place]) 07/16/2017  . Polysubstance abuse ([place]) 07/16/2017  . Eczema 05/08/2017  . Borderline personality disorder ([place]) 11/06/2016  . Cocaine use disorder, severe, dependence ([place]) 11/06/2016  . Cannabis use disorder, moderate, dependence ([place]) 11/06/2016  . Alcohol use disorder, mild, abuse 11/06/2016  . Asthma 09/23/2011  . Tobacco use disorder 09/15/2009    Past Surgical History:  Procedure Laterality Date  . ADENOIDECTOMY    . TONSILLECTOMY      Prior to Admission medications   Medication Sig Start Date End Date Taking? Authorizing Provider  diphenhydrAMINE (BENADRYL) 25 mg capsule Take 1 capsule (25 mg total) by mouth every 4 (four) hours as needed. 11/04/19 11/03/20  Laban Emperor, PA-C  doxycycline (VIBRA-TABS) 100 MG tablet Take 1 tablet (100 mg total) by mouth 2 (two) times daily. Patient not taking: Reported on 11/04/2019 10/11/19   Harvest Dark, MD  naphazoline-glycerin (CLEAR EYES REDNESS) 0.012-0.2 % SOLN Place 1-2 drops into both eyes 4 (four) times daily as needed for eye irritation. 02/03/20   Arta Silence, MD    Allergies Banana, Cantaloupe (diagnostic), Grapefruit extract, and Albumin human  Family History  Adopted: Yes  Problem Relation Age of Onset  . Mental illness Mother   . Mental illness Father   .  Heart disease Maternal Grandmother   . Sickle cell trait Adoptive Mother   . Cleft palate Cousin        maternal side    Social History Social History   Tobacco Use  . Smoking status: Former Smoker    Packs/day: 0.75    Years: 2.00     Pack years: 1.50    Types: Cigarettes  . Smokeless tobacco: Never Used  . Tobacco comment: Patient states she quit smoking "a long time ago"  Substance Use Topics  . Alcohol use: Not Currently    Alcohol/week: 1.0 standard drinks    Types: 1 Cans of beer per week  . Drug use: Not Currently    Types: Marijuana, "Crack" cocaine, Heroin    Review of Systems  Review of Systems  Unable to perform ROS: Psychiatric disorder     ____________________________________________  PHYSICAL EXAM:      VITAL SIGNS: ED Triage Vitals  Enc Vitals Group     BP      Pulse      Resp      Temp      Temp src      SpO2      Weight      Height      Head Circumference      [place]      Pain Score      Pain Loc      Pain Edu?      Excl. in GC?      Physical Exam Vitals and nursing note reviewed.  Constitutional:      General: She is not in acute distress.    Appearance: She is well-developed.  HENT:     Head: Normocephalic and atraumatic.  Eyes:     Conjunctiva/sclera: Conjunctivae normal.  Cardiovascular:     Rate and Rhythm: Normal rate and regular rhythm.     Heart sounds: Normal heart sounds.  Pulmonary:     Effort: Pulmonary effort is normal. No respiratory distress.     Breath sounds: No wheezing.  Abdominal:     General: There is no distension.  Musculoskeletal:     Cervical back: Neck supple.  Skin:    General: Skin is warm.     Capillary Refill: Capillary refill takes less than 2 seconds.     Findings: No rash.  Neurological:     Mental Status: She is alert and oriented to person, place, and time.     Motor: No abnormal muscle tone.  Psychiatric:        Attention and Perception: She perceives auditory and visual hallucinations.        Mood and Affect: Affect is blunt.        Behavior: Behavior is withdrawn.        Thought Content: Thought content is paranoid.       ____________________________________________   LABS (all labs ordered are listed, but only  abnormal results are displayed)  Labs Reviewed  COMPREHENSIVE METABOLIC PANEL - Abnormal; Notable for the following components:      Result Value   Potassium 3.4 (*)    Creatinine, Ser 1.05 (*)    Total Bilirubin 1.9 (*)    All other components within normal limits  ACETAMINOPHEN LEVEL - Abnormal; Notable for the following components:   Acetaminophen (Tylenol), Serum <10 (*)    All other components within normal limits  SALICYLATE LEVEL - Abnormal; Notable for the following components:   Salicylate Lvl <7.0 (*)  All other components within normal limits  RESPIRATORY PANEL BY RT PCR (FLU A&B, COVID)  ETHANOL  CBC WITH DIFFERENTIAL/PLATELET  URINE DRUG SCREEN, QUALITATIVE ([facility] ONLY)  POC URINE PREG, ED    ____________________________________________  EKG: Normal sinus rhythm, ventricular rate 85.  PR 152, QRS 92, QTc 473.  No acute ST elevations or depressions.  No evidence of acute ischemia or infarct. ________________________________________  RADIOLOGY All imaging, including plain films, CT scans, and ultrasounds, independently reviewed by me, and interpretations confirmed via formal radiology reads.  ED MD interpretation:   None  Official radiology report(s): No results found.  ____________________________________________  PROCEDURES   Procedure(s) performed (including Critical Care):  Procedures  ____________________________________________  INITIAL IMPRESSION / MDM / ASSESSMENT AND PLAN / ED COURSE  As part of my medical decision making, I reviewed the following data within the electronic MEDICAL RECORD NUMBER Nursing notes reviewed and incorporated, Old chart reviewed, Notes from prior ED visits, and [place] Controlled Substance Database       *[name] Divya Munshi was evaluated in Emergency Department on 02/28/2020 for the symptoms described in the history of present illness. She was evaluated in the context of the global COVID-19 pandemic, which necessitated  consideration that the patient might be at risk for infection with the SARS-CoV-2 virus that causes COVID-19. Institutional protocols and algorithms that pertain to the evaluation of patients at risk for COVID-19 are in a state of rapid change based on information released by regulatory bodies including the CDC and federal and state organizations. These policies and algorithms were followed during the patient's care in the ED.  Some ED evaluations and interventions may be delayed as a result of limited staffing during the pandemic.*     Medical Decision Making:  28 yo F here with reported auditory and visual hallucinations, erratic behavior. Requesting psych evaluation. Lab work is largely unremarkable. She has an extensive h/o substance use and psychiatric comorbidities. Will consult TTS/Psych.  ____________________________________________  FINAL CLINICAL IMPRESSION(S) / ED DIAGNOSES  Final diagnoses:  Hallucinations     MEDICATIONS GIVEN DURING THIS VISIT:  Medications  hydrOXYzine (ATARAX/VISTARIL) tablet 25 mg (25 mg Oral Given 02/27/20 2202)     ED Discharge Orders    None       Note:  This document was prepared using Dragon voice recognition software and may include unintentional dictation errors.   Shaune Pollack, MD 02/28/20 [phone]

## 2020-02-28 LAB — URINE DRUG SCREEN, QUALITATIVE (ARMC ONLY)
Amphetamines, Ur Screen: NOT DETECTED
Barbiturates, Ur Screen: NOT DETECTED
Benzodiazepine, Ur Scrn: NOT DETECTED
Cannabinoid 50 Ng, Ur ~~LOC~~: POSITIVE — AB
Cocaine Metabolite,Ur ~~LOC~~: POSITIVE — AB
MDMA (Ecstasy)Ur Screen: NOT DETECTED
Methadone Scn, Ur: NOT DETECTED
Opiate, Ur Screen: NOT DETECTED
Phencyclidine (PCP) Ur S: NOT DETECTED
Tricyclic, Ur Screen: NOT DETECTED

## 2020-02-28 LAB — POCT PREGNANCY, URINE: Preg Test, Ur: NEGATIVE

## 2020-02-28 NOTE — ED Notes (Signed)
Pt asleep, breakfast tray placed on bed.  

## 2020-02-28 NOTE — Discharge Instructions (Addendum)

## 2020-02-28 NOTE — ED Notes (Signed)
TTS was unable to assess Bridget Carpenter, as she unable to be aroused to participate in the assessment.

## 2020-02-28 NOTE — ED Notes (Signed)
Resumed care from [place], [place]. Pt resting comfortably with even respirations. NAD noted.

## 2020-02-28 NOTE — ED Provider Notes (Signed)
-----------------------------------------   12:05 AM on 02/28/2020 -----------------------------------------   Blood pressure 117/86, pulse 91, temperature 98.7 F (37.1 C), temperature source Oral, resp. rate 18, height 5\' 1"  (1.549 m), weight 52.2 kg, SpO2 100 %, unknown if currently breastfeeding.  The patient is calm and cooperative at this time.  There have been no acute events since the last update.  Awaiting disposition plan from Behavioral Medicine and/or Social Work team(s).    , MD 02/28/20 0005

## 2020-02-28 NOTE — Consult Note (Signed)
[facility] Face-to-Face Psychiatry Consult   Reason for Consult: Altered mental status Referring Physician: Dr. Erma Heritage Patient Identification: Bridget Carpenter MRN:  [id number] Principal Diagnosis: <principal problem not specified> Diagnosis:  Active Problems:   Asthma   Borderline personality disorder (HCC)   Cocaine use disorder, severe, dependence (HCC)   Cannabis use disorder, moderate, dependence (HCC)   Alcohol use disorder, mild, abuse   Eczema   Overdose   Aspiration pneumonia (HCC)   Acute respiratory failure (HCC)   MRSA carrier   Bacterial vaginosis   Cocaine abuse with cocaine-induced mood disorder (HCC)   Club foot, fetal, affecting care of mother, antepartum   Decreased fetal movement   Homelessness   Labor and delivery indication for care or intervention   Indication for care in labor or delivery   Dehydration   Indication for care in labor and delivery, antepartum   Hydrops fetalis   MDD (major depressive disorder), recurrent episode, severe (HCC)   Adjustment disorder      Patient reassessed this morning.  She adamantly denied any suicidal homicidal ideation.  She reports that her presentation was due to her cocaine intoxication.  She acknowledges that her cocaine use is not good for and can continue to cause problems in her life.  She has the adequate outpatient resources to follow-up with substance abuse help if she so chooses.  Patient at this time is clear and coherent with stable mood and does not represent a danger to herself or others and therefore can be discharged.   Original note from psych NP as follows below " Total Time spent with patient: 30 minutes  Subjective: " I am here because I refused to give my mom money and she called the cops on me." Bridget [place] Bridget Carpenter is a 29 y.o. female presented to [facility] ED via EMS voluntarily. The patient was brought in due to her experiencing visual hallucinations.  It was reported that she was seeing lions and  bears. During her initial assessment, the patient is very fidgety, generalized scratching and voicing that she is seeing things floating over and does not have anywhere to go.    The patient was seen face-to-face by this provider; chart reviewed and consulted with Dr. Erma Heritage on 02/27/2020 due to the patient's care. It was discussed with the EDP that the patient is presenting to be under the influence of some substance. Which the patient denies. She would be observed overnight and reassess in the a.m. to determine if she meets the criteria for psychiatric inpatient admission or could be discharged back to the community. The patient is alert and oriented x3, calm, cooperative, hungry, and mood-congruent, affecting evaluation. The patient does appear to be responding to internal and external stimuli. She is presenting with delusional thinking. The patient denies auditory hallucinations but verbalized visual hallucinations, such as seeing lions and bears. The patient denies suicidal, homicidal, or self-harm ideations. The patient is presenting with psychotic behaviors, but she is not exhibiting any paranoid behaviors. During an encounter with the patient, she was able to answer questions appropriately.  Plan:  The patient will be observed overnight and reassess in the a.m. to determine if she meets the criteria for psychiatric inpatient admission or can be discharged back to the community. HPI: Per Dr. Erma Heritage; Bridget Carpenter is a 29 y.o. female  With PMHx ODD, schizoaffective d/o, here with reported hallucinations. She is somewhat evasive on exam. Reports she has been seeing "lions" and visual hallucinations, hearing voices. She has not  been taking any medications. She states she feels dehydrated but is unable to tell me why, does admit she is homeless and unfortunately has not had much to eat/drink recently. Denies any complaints. Denies drug use.   Level 5 caveat invoked as remainder of history, ROS,  and physical exam limited due to patient's psychiatric condition.   Past Psychiatric History:  Depression Dissociative disorder or reaction H/O: Suicide attempt ODD (oppositional defiant disorder) PTSD (post traumatic stress disorder) Schizoaffective disorder (HCC) Substance abuse (HCC)  Risk to Self:  No Risk to Others:  No Prior Inpatient Therapy:  Yes Prior Outpatient Therapy:  Yes  Past Medical History:  Past Medical History:  Diagnosis Date  . Asthma   . Depression 2009   Inpatient psych admission for SI, dissociative fugue  . Dissociative disorder or reaction 2009  . Eczema   . H/O: suicide attempt   . ODD (oppositional defiant disorder)   . PTSD (post-traumatic stress disorder)   . Schizoaffective disorder (HCC)   . Substance abuse [facility])     Past Surgical History:  Procedure Laterality Date  . ADENOIDECTOMY    . TONSILLECTOMY     Family History:  Family History  Adopted: Yes  Problem Relation Age of Onset  . Mental illness Mother   . Mental illness Father   . Heart disease Maternal Grandmother   . Sickle cell trait Adoptive Mother   . Cleft palate Cousin        maternal side   Family Psychiatric  History:  Social History:  Social History   Substance and Sexual Activity  Alcohol Use Not Currently  . Alcohol/week: 1.0 standard drinks  . Types: 1 Cans of beer per week     Social History   Substance and Sexual Activity  Drug Use Not Currently  . Types: Marijuana, "Crack" cocaine, Heroin    Social History   Socioeconomic History  . Marital status: Single    Spouse name: Not on file  . Number of children: 1  . Years of education: Not on file  . Highest education level: Not on file  Occupational History  . Not on file  Tobacco Use  . Smoking status: Former Smoker    Packs/day: 0.75    Years: 2.00    Pack years: 1.50    Types: Cigarettes  . Smokeless tobacco: Never Used  . Tobacco comment: Patient states she quit smoking "a long time ago"   Substance and Sexual Activity  . Alcohol use: Not Currently    Alcohol/week: 1.0 standard drinks    Types: 1 Cans of beer per week  . Drug use: Not Currently    Types: Marijuana, "Crack" cocaine, Heroin  . Sexual activity: Not Currently    Partners: Male  Other Topics Concern  . Not on file  Social History Narrative   Is homeless   Her mother is her daughter's gaurdian   Multiple psychiatric hospitalizations, not on any medication currently   Substance use disorder   Social Determinants of Health   Financial Resource Strain: High Risk  . Difficulty of Paying Living Expenses: Very hard  Food Insecurity: Food Insecurity Present  . Worried About Programme researcher, broadcasting/film/video in the Last Year: Often true  . Ran Out of Food in the Last Year: Often true  Transportation Needs: No Transportation Needs  . Lack of Transportation (Medical): No  . Lack of Transportation (Non-Medical): No  Physical Activity: Sufficiently Active  . Days of Exercise per Week: 4 days  .  Minutes of Exercise per Session: 40 min  Stress: Stress Concern Present  . Feeling of Stress : Very much  Social Connections: Severely Isolated  . Frequency of Communication with Friends and Family: Never  . Frequency of Social Gatherings with Friends and Family: Never  . Attends Religious Services: Never  . Active Member of Clubs or Organizations: No  . Attends Archivist Meetings: Never  . Marital Status: Never married   Additional Social History:    Allergies:   Allergies  Allergen Reactions  . Banana Hives and Swelling  . Cantaloupe (Diagnostic) Hives  . Grapefruit Extract Itching and Swelling  . Albumin Human Other (See Comments)    Pt refuses all blood products.     Labs:  Results for orders placed or performed during the hospital encounter of 02/27/20 (from the past 48 hour(s))  Comprehensive metabolic panel     Status: Abnormal   Collection Time: 02/27/20  9:06 PM  Result Value Ref Range   Sodium 144  135 - 145 mmol/L   Potassium 3.4 (L) 3.5 - 5.1 mmol/L   Chloride 110 98 - 111 mmol/L   CO2 26 22 - 32 mmol/L   Glucose, Bld 78 70 - 99 mg/dL    Comment: Glucose reference range applies only to samples taken after fasting for at least 8 hours.   BUN 13 6 - 20 mg/dL   Creatinine, Ser 1.05 (H) 0.44 - 1.00 mg/dL   Calcium 9.2 8.9 - 10.3 mg/dL   Total Protein 7.4 6.5 - 8.1 g/dL   Albumin 4.5 3.5 - 5.0 g/dL   AST 20 15 - 41 U/L   ALT 10 0 - 44 U/L   Alkaline Phosphatase 44 38 - 126 U/L   Total Bilirubin 1.9 (H) 0.3 - 1.2 mg/dL   GFR calc non Af Amer >60 >60 mL/min   GFR calc Af Amer >60 >60 mL/min   Anion gap 8 5 - 15    Comment: Performed at [facility], [address] [place], [place] [address]  Ethanol     Status: None   Collection Time: 02/27/20  9:06 PM  Result Value Ref Range   Alcohol, Ethyl (B) <10 <10 mg/dL    Comment: (NOTE) Lowest detectable limit for serum alcohol is 10 mg/dL. For medical purposes only. Performed at [facility], [address] [place], [place] [address]   CBC with Diff     Status: None   Collection Time: 02/27/20  9:06 PM  Result Value Ref Range   WBC 6.6 4.0 - 10.5 K/uL   RBC 4.25 3.87 - 5.11 MIL/uL   Hemoglobin 13.0 12.0 - 15.0 g/dL   HCT 39.8 36.0 - 46.0 %   MCV 93.6 80.0 - 100.0 fL   MCH 30.6 26.0 - 34.0 pg   MCHC 32.7 30.0 - 36.0 g/dL   RDW 13.4 11.5 - 15.5 %   Platelets 209 150 - 400 K/uL   nRBC 0.0 0.0 - 0.2 %   Neutrophils Relative % 59 %   Neutro Abs 3.9 1.7 - 7.7 K/uL   Lymphocytes Relative 27 %   Lymphs Abs 1.8 0.7 - 4.0 K/uL   Monocytes Relative 9 %   Monocytes Absolute 0.6 0.1 - 1.0 K/uL   Eosinophils Relative 4 %   Eosinophils Absolute 0.3 0.0 - 0.5 K/uL   Basophils Relative 1 %   Basophils Absolute 0.0 0.0 - 0.1 K/uL   Immature Granulocytes 0 %   Abs  Immature Granulocytes 0.02 0.00 - 0.07 K/uL    Comment: Performed at [facility], [address] [place], [place] [address]  Acetaminophen  level     Status: Abnormal   Collection Time: 02/27/20  9:06 PM  Result Value Ref Range   Acetaminophen (Tylenol), Serum <10 (L) 10 - 30 ug/mL    Comment: (NOTE) Therapeutic concentrations vary significantly. A range of 10-30 ug/mL  may be an effective concentration for many patients. However, some  are best treated at concentrations outside of this range. Acetaminophen concentrations >150 ug/mL at 4 hours after ingestion  and >50 ug/mL at 12 hours after ingestion are often associated with  toxic reactions. Performed at [facility], [address] [place], [place] [address]   Salicylate level     Status: Abnormal   Collection Time: 02/27/20  9:06 PM  Result Value Ref Range   Salicylate Lvl <7.0 (L) 7.0 - 30.0 mg/dL    Comment: Performed at [facility], [address], [place], [place] [address]  Respiratory Panel by RT PCR (Flu A&B, Covid) - Nasopharyngeal Swab     Status: None   Collection Time: 02/27/20  9:51 PM   Specimen: Nasopharyngeal Swab  Result Value Ref Range   SARS Coronavirus 2 by RT PCR NEGATIVE NEGATIVE    Comment: (NOTE) SARS-CoV-2 target nucleic acids are NOT DETECTED. The SARS-CoV-2 RNA is generally detectable in upper respiratoy specimens during the acute phase of infection. The lowest concentration of SARS-CoV-2 viral copies this assay can detect is 131 copies/mL. A negative result does not preclude SARS-Cov-2 infection and should not be used as the sole basis for treatment or other patient management decisions. A negative result may occur with  improper specimen collection/handling, submission of specimen other than nasopharyngeal swab, presence of viral mutation(s) within the areas targeted by this assay, and inadequate number of viral copies (<131 copies/mL). A negative result must be combined with clinical observations, patient history, and epidemiological information. The expected result is Negative. Fact Sheet for Patients:   https://www.moore.com/ Fact Sheet for Healthcare Providers:  https://www.young.biz/ This test is not yet ap proved or cleared by the [place] FDA and  has been authorized for detection and/or diagnosis of SARS-CoV-2 by FDA under an Emergency Use Authorization (EUA). This EUA will remain  in effect (meaning this test can be used) for the duration of the COVID-19 declaration under Section 564(b)(1) of the Act, 21 U.S.C. section 360bbb-3(b)(1), unless the authorization is terminated or revoked sooner.    Influenza A by PCR NEGATIVE NEGATIVE   Influenza B by PCR NEGATIVE NEGATIVE    Comment: (NOTE) The Xpert Xpress SARS-CoV-2/FLU/RSV assay is intended as an aid in  the diagnosis of influenza from Nasopharyngeal swab specimens and  should not be used as a sole basis for treatment. Nasal washings and  aspirates are unacceptable for Xpert Xpress SARS-CoV-2/FLU/RSV  testing. Fact Sheet for Patients: https://www.moore.com/ Fact Sheet for Healthcare Providers: https://www.young.biz/ This test is not yet approved or cleared by the [place] FDA and  has been authorized for detection and/or diagnosis of SARS-CoV-2 by  FDA under an Emergency Use Authorization (EUA). This EUA will remain  in effect (meaning this test can be used) for the duration of the  Covid-19 declaration under Section 564(b)(1) of the Act, 21  U.S.C. section 360bbb-3(b)(1), unless the authorization is  terminated or revoked. Performed at [facility], [address] [place], [place] [address]   Urine Drug Screen, Qualitative  Status: Abnormal   Collection Time: 02/28/20  4:25 AM  Result Value Ref Range   Tricyclic, Ur Screen NONE DETECTED NONE DETECTED   Amphetamines, Ur Screen NONE DETECTED NONE DETECTED   MDMA (Ecstasy)Ur Screen NONE DETECTED NONE DETECTED   Cocaine Metabolite,Ur [place] POSITIVE (A) NONE DETECTED   Opiate,  Ur Screen NONE DETECTED NONE DETECTED   Phencyclidine (PCP) Ur S NONE DETECTED NONE DETECTED   Cannabinoid 50 Ng, Ur [place] POSITIVE (A) NONE DETECTED   Barbiturates, Ur Screen NONE DETECTED NONE DETECTED   Benzodiazepine, Ur Scrn NONE DETECTED NONE DETECTED   Methadone Scn, Ur NONE DETECTED NONE DETECTED    Comment: (NOTE) Tricyclics + metabolites, urine    Cutoff 1000 ng/mL Amphetamines + metabolites, urine  Cutoff 1000 ng/mL MDMA (Ecstasy), urine              Cutoff 500 ng/mL Cocaine Metabolite, urine          Cutoff 300 ng/mL Opiate + metabolites, urine        Cutoff 300 ng/mL Phencyclidine (PCP), urine         Cutoff 25 ng/mL Cannabinoid, urine                 Cutoff 50 ng/mL Barbiturates + metabolites, urine  Cutoff 200 ng/mL Benzodiazepine, urine              Cutoff 200 ng/mL Methadone, urine                   Cutoff 300 ng/mL The urine drug screen provides only a preliminary, unconfirmed analytical test result and should not be used for non-medical purposes. Clinical consideration and professional judgment should be applied to any positive drug screen result due to possible interfering substances. A more specific alternate chemical method must be used in order to obtain a confirmed analytical result. Gas chromatography / mass spectrometry (GC/MS) is the preferred confirmat ory method. Performed at [facility], [address] [place], [place] [id number]   Pregnancy, urine POC     Status: None   Collection Time: 02/28/20  4:28 AM  Result Value Ref Range   Preg Test, Ur NEGATIVE NEGATIVE    Comment:        THE SENSITIVITY OF THIS METHODOLOGY IS >24 mIU/mL     No current facility-administered medications for this encounter.   Current Outpatient Medications  Medication Sig Dispense Refill  . diphenhydrAMINE (BENADRYL) 25 mg capsule Take 1 capsule (25 mg total) by mouth every 4 (four) hours as needed. (Patient not taking: Reported on 02/28/2020) 10 capsule 0  .  doxycycline (VIBRA-TABS) 100 MG tablet Take 1 tablet (100 mg total) by mouth 2 (two) times daily. (Patient not taking: Reported on 11/04/2019) 20 tablet 0  . naphazoline-glycerin (CLEAR EYES REDNESS) 0.012-0.2 % SOLN Place 1-2 drops into both eyes 4 (four) times daily as needed for eye irritation. (Patient not taking: Reported on 02/28/2020) 15 mL 0    Musculoskeletal: Strength & Muscle Tone: within normal limits Gait & Station: normal Patient leans: N/A  Psychiatric Specialty Exam: Physical Exam  Nursing note and vitals reviewed. Constitutional: She appears well-developed and well-nourished.  Cardiovascular: Normal rate.  Respiratory: Effort normal.  Musculoskeletal:        General: Normal range of motion.     Cervical back: Normal range of motion and neck supple.  Psychiatric: Her behavior is normal.    Review of Systems  Psychiatric/Behavioral: Positive for confusion and hallucinations. The patient is  nervous/anxious.   All other systems reviewed and are negative.   Blood pressure (!) 96/51, pulse 74, temperature 98 F (36.7 C), temperature source Oral, resp. rate 16, height 5\' 1"  (1.549 m), weight 52.2 kg, SpO2 100 %, unknown if currently breastfeeding.Body mass index is 21.73 kg/m.  General Appearance: Disheveled  Eye Contact:  Minimal  Speech:  Clear and Coherent  Volume:  Increased  Mood:  Angry and Anxious  Affect:  Congruent  Thought Process:  Coherent  Orientation:  Full (Time, Place, and Person)  Thought Content:  Logical  Suicidal Thoughts:  No  Homicidal Thoughts:  No  Memory:  Immediate;   Good Recent;   Good Remote;   Good  Judgement:  Fair  Insight:  Shallow  Psychomotor Activity:  Normal  Concentration:  Concentration: Fair and Attention Span: Fair  Recall:  [employer] of Knowledge:  Fair  Language:  Good  Akathisia:  Negative  Handed:  Right  AIMS (if indicated):     Assets:  Communication Skills Desire for Improvement Social Support  ADL's:   Intact  Cognition:  WNL  Sleep:    Okay      Treatment Plan Summary: Patient reassessed this morning.  She denies any suicidal or homicidal ideation.  She understands that her presentation may have been caused by her cocaine intoxication.  Patient at this time is not a danger to herself or others and does not meet inpatient criteria and therefore will be discharged.  Disposition: Supportive therapy provided about ongoing stressors. The patient will remain under observation overnight and reassess in the a.m. to determine if she meets criteria for psychiatric inpatient admission.  Clement Sayres, MD 02/28/2020 11:52 AM

## 2020-02-28 NOTE — ED Provider Notes (Signed)
-----------------------------------------   11:44 AM on 02/28/2020 -----------------------------------------   Blood pressure 117/86, pulse 91, temperature 98.7 F (37.1 C), temperature source Oral, resp. rate 18, height 5\' 1"  (1.549 m), weight 52.2 kg, SpO2 100 %, unknown if currently breastfeeding.  The patient is calm and cooperative at this time.  There have been no acute events since the last update.  Patient reassessed by psychiatrist who agrees the patient remains psychiatrically stable for discharge.  She had transient symptoms that are attributable to substance abuse, now resolved.  Remains medically stable for outpatient follow-up.   , MD 02/28/20 1144

## 2020-02-28 NOTE — ED Notes (Signed)
Pt discharged home after verbalizing understanding of discharge instructions; nad noted. 

## 2020-02-28 NOTE — ED Provider Notes (Signed)
Patient was requesting to be discharged, she was reseen by Gretchen Portela from psychiatry, and was advised that is now okay to discharge the patient.  Patient alert and oriented.  Patient does request that she would like to wait for a little later in the morning since is currently dark outside.    Will plan to discharge patient once slightly later in the morning (sunrise).     Sharyn Creamer, MD 02/28/20 [phone]

## 2020-02-28 NOTE — Consult Note (Signed)
[facility] Face-to-Face Psychiatry Consult   Reason for Consult: Altered mental status Referring Physician: Dr. Erma Heritage Patient Identification: [name] MRN:  [id number] Principal Diagnosis: <principal problem not specified> Diagnosis:  Active Problems:   Asthma   Borderline personality disorder (HCC)   Cocaine use disorder, severe, dependence (HCC)   Cannabis use disorder, moderate, dependence (HCC)   Alcohol use disorder, mild, abuse   Eczema   Overdose   Aspiration pneumonia (HCC)   Acute respiratory failure (HCC)   MRSA carrier   Bacterial vaginosis   Cocaine abuse with cocaine-induced mood disorder (HCC)   Club foot, fetal, affecting care of mother, antepartum   Decreased fetal movement   Homelessness   Labor and delivery indication for care or intervention   Indication for care in labor or delivery   Dehydration   Indication for care in labor and delivery, antepartum   Hydrops fetalis   MDD (major depressive disorder), recurrent episode, severe (HCC)   Adjustment disorder   Total Time spent with patient: 30 minutes  Subjective: " I am here because I refused to give my mom money and she called the cops on me." [name] presented to [facility] ED via EMS voluntarily. The patient was brought in due to her experiencing visual hallucinations.  It was reported that she was seeing lions and bears. During her initial assessment, the patient is very fidgety, generalized scratching and voicing that she is seeing things floating over and does not have anywhere to go.    The patient was seen face-to-face by this provider; chart reviewed and consulted with Dr. Erma Heritage on 02/27/2020 due to the patient's care. It was discussed with the EDP that the patient is presenting to be under the influence of some substance. Which the patient denies. She would be observed overnight and reassess in the a.m. to determine if she meets the criteria for psychiatric inpatient  admission or could be discharged back to the community. The patient is alert and oriented x3, calm, cooperative, hungry, and mood-congruent, affecting evaluation. The patient does appear to be responding to internal and external stimuli. She is presenting with delusional thinking. The patient denies auditory hallucinations but verbalized visual hallucinations, such as seeing lions and bears. The patient denies suicidal, homicidal, or self-harm ideations. The patient is presenting with psychotic behaviors, but she is not exhibiting any paranoid behaviors. During an encounter with the patient, she was able to answer questions appropriately.  Plan:  The patient will be observed overnight and reassess in the a.m. to determine if she meets the criteria for psychiatric inpatient admission or can be discharged back to the community. HPI: Per Dr. Erma Heritage; [name] [name] is a 29 y.o. female  With PMHx ODD, schizoaffective d/o, here with reported hallucinations. She is somewhat evasive on exam. Reports she has been seeing "lions" and visual hallucinations, hearing voices. She has not been taking any medications. She states she feels dehydrated but is unable to tell me why, does admit she is homeless and unfortunately has not had much to eat/drink recently. Denies any complaints. Denies drug use.   Level 5 caveat invoked as remainder of history, ROS, and physical exam limited due to patient's psychiatric condition.   Past Psychiatric History:  Depression Dissociative disorder or reaction H/O: Suicide attempt ODD (oppositional defiant disorder) PTSD (post traumatic stress disorder) Schizoaffective disorder (HCC) Substance abuse (HCC)  Risk to Self:  No Risk to Others:  No Prior Inpatient Therapy:  Yes Prior  Outpatient Therapy:  Yes  Past Medical History:  Past Medical History:  Diagnosis Date  . Asthma   . Depression 2009   Inpatient psych admission for SI, dissociative fugue  . Dissociative  disorder or reaction 2009  . Eczema   . H/O: suicide attempt   . ODD (oppositional defiant disorder)   . PTSD (post-traumatic stress disorder)   . Schizoaffective disorder (HCC)   . Substance abuse [facility])     Past Surgical History:  Procedure Laterality Date  . ADENOIDECTOMY    . TONSILLECTOMY     Family History:  Family History  Adopted: Yes  Problem Relation Age of Onset  . Mental illness Mother   . Mental illness Father   . Heart disease Maternal Grandmother   . Sickle cell trait Adoptive Mother   . Cleft palate Cousin        maternal side   Family Psychiatric  History:  Social History:  Social History   Substance and Sexual Activity  Alcohol Use Not Currently  . Alcohol/week: 1.0 standard drinks  . Types: 1 Cans of beer per week     Social History   Substance and Sexual Activity  Drug Use Not Currently  . Types: Marijuana, "Crack" cocaine, Heroin    Social History   Socioeconomic History  . Marital status: Single    Spouse name: Not on file  . Number of children: 1  . Years of education: Not on file  . Highest education level: Not on file  Occupational History  . Not on file  Tobacco Use  . Smoking status: Former Smoker    Packs/day: 0.75    Years: 2.00    Pack years: 1.50    Types: Cigarettes  . Smokeless tobacco: Never Used  . Tobacco comment: Patient states she quit smoking "a long time ago"  Substance and Sexual Activity  . Alcohol use: Not Currently    Alcohol/week: 1.0 standard drinks    Types: 1 Cans of beer per week  . Drug use: Not Currently    Types: Marijuana, "Crack" cocaine, Heroin  . Sexual activity: Not Currently    Partners: Male  Other Topics Concern  . Not on file  Social History Narrative   Is homeless   Her mother is her daughter's gaurdian   Multiple psychiatric hospitalizations, not on any medication currently   Substance use disorder   Social Determinants of Health   Financial Resource Strain: High Risk  .  Difficulty of Paying Living Expenses: Very hard  Food Insecurity: Food Insecurity Present  . Worried About Programme researcher, broadcasting/film/video in the Last Year: Often true  . Ran Out of Food in the Last Year: Often true  Transportation Needs: No Transportation Needs  . Lack of Transportation (Medical): No  . Lack of Transportation (Non-Medical): No  Physical Activity: Sufficiently Active  . Days of Exercise per Week: 4 days  . Minutes of Exercise per Session: 40 min  Stress: Stress Concern Present  . Feeling of Stress : Very much  Social Connections: Severely Isolated  . Frequency of Communication with Friends and Family: Never  . Frequency of Social Gatherings with Friends and Family: Never  . Attends Religious Services: Never  . Active Member of Clubs or Organizations: No  . Attends Banker Meetings: Never  . Marital Status: Never married   Additional Social History:    Allergies:   Allergies  Allergen Reactions  . Banana Hives and Swelling  . Cantaloupe (Diagnostic)  Hives  . Grapefruit Extract Itching and Swelling  . Albumin Human Other (See Comments)    Pt refuses all blood products.     Labs:  Results for orders placed or performed during the hospital encounter of 02/27/20 (from the past 48 hour(s))  Comprehensive metabolic panel     Status: Abnormal   Collection Time: 02/27/20  9:06 PM  Result Value Ref Range   Sodium 144 135 - 145 mmol/L   Potassium 3.4 (L) 3.5 - 5.1 mmol/L   Chloride 110 98 - 111 mmol/L   CO2 26 22 - 32 mmol/L   Glucose, Bld 78 70 - 99 mg/dL    Comment: Glucose reference range applies only to samples taken after fasting for at least 8 hours.   BUN 13 6 - 20 mg/dL   Creatinine, Ser 1.05 (H) 0.44 - 1.00 mg/dL   Calcium 9.2 8.9 - 10.3 mg/dL   Total Protein 7.4 6.5 - 8.1 g/dL   Albumin 4.5 3.5 - 5.0 g/dL   AST 20 15 - 41 U/L   ALT 10 0 - 44 U/L   Alkaline Phosphatase 44 38 - 126 U/L   Total Bilirubin 1.9 (H) 0.3 - 1.2 mg/dL   GFR calc non Af Amer  >60 >60 mL/min   GFR calc Af Amer >60 >60 mL/min   Anion gap 8 5 - 15    Comment: Performed at [facility], [address] [place], [place] [address]  Ethanol     Status: None   Collection Time: 02/27/20  9:06 PM  Result Value Ref Range   Alcohol, Ethyl (B) <10 <10 mg/dL    Comment: (NOTE) Lowest detectable limit for serum alcohol is 10 mg/dL. For medical purposes only. Performed at [facility], [address] [place], [place] [address]   CBC with Diff     Status: None   Collection Time: 02/27/20  9:06 PM  Result Value Ref Range   WBC 6.6 4.0 - 10.5 K/uL   RBC 4.25 3.87 - 5.11 MIL/uL   Hemoglobin 13.0 12.0 - 15.0 g/dL   HCT 39.8 36.0 - 46.0 %   MCV 93.6 80.0 - 100.0 fL   MCH 30.6 26.0 - 34.0 pg   MCHC 32.7 30.0 - 36.0 g/dL   RDW 13.4 11.5 - 15.5 %   Platelets 209 150 - 400 K/uL   nRBC 0.0 0.0 - 0.2 %   Neutrophils Relative % 59 %   Neutro Abs 3.9 1.7 - 7.7 K/uL   Lymphocytes Relative 27 %   Lymphs Abs 1.8 0.7 - 4.0 K/uL   Monocytes Relative 9 %   Monocytes Absolute 0.6 0.1 - 1.0 K/uL   Eosinophils Relative 4 %   Eosinophils Absolute 0.3 0.0 - 0.5 K/uL   Basophils Relative 1 %   Basophils Absolute 0.0 0.0 - 0.1 K/uL   Immature Granulocytes 0 %   Abs Immature Granulocytes 0.02 0.00 - 0.07 K/uL    Comment: Performed at [facility], [place], [place] [address]  Acetaminophen level     Status: Abnormal   Collection Time: 02/27/20  9:06 PM  Result Value Ref Range   Acetaminophen (Tylenol), Serum <10 (L) 10 - 30 ug/mL    Comment: (NOTE) Therapeutic concentrations vary significantly. A range of 10-30 ug/mL  may be an effective concentration for many patients. However, some  are best treated at concentrations outside of this range. Acetaminophen concentrations >150 ug/mL at 4 hours after ingestion  and >50 ug/mL at 12 hours after ingestion are often associated with  toxic reactions. Performed at [facility], [address] [place], [place] [address]   Salicylate level     Status: Abnormal   Collection Time: 02/27/20  9:06 PM  Result Value Ref Range   Salicylate Lvl <7.0 (L) 7.0 - 30.0 mg/dL    Comment: Performed at [facility], [address], [place], [place] [address]  Respiratory Panel by RT PCR (Flu A&B, Covid) - Nasopharyngeal Swab     Status: None   Collection Time: 02/27/20  9:51 PM   Specimen: Nasopharyngeal Swab  Result Value Ref Range   SARS Coronavirus 2 by RT PCR NEGATIVE NEGATIVE    Comment: (NOTE) SARS-CoV-2 target nucleic acids are NOT DETECTED. The SARS-CoV-2 RNA is generally detectable in upper respiratoy specimens during the acute phase of infection. The lowest concentration of SARS-CoV-2 viral copies this assay can detect is 131 copies/mL. A negative result does not preclude SARS-Cov-2 infection and should not be used as the sole basis for treatment or other patient management decisions. A negative result may occur with  improper specimen collection/handling, submission of specimen other than nasopharyngeal swab, presence of viral mutation(s) within the areas targeted by this assay, and inadequate number of viral copies (<131 copies/mL). A negative result must be combined with clinical observations, patient history, and epidemiological information. The expected result is Negative. Fact Sheet for Patients:  https://www.moore.com/ Fact Sheet for Healthcare Providers:  https://www.young.biz/ This test is not yet ap proved or cleared by the [place] FDA and  has been authorized for detection and/or diagnosis of SARS-CoV-2 by FDA under an Emergency Use Authorization (EUA). This EUA will remain  in effect (meaning this test can be used) for the duration of the COVID-19 declaration under Section 564(b)(1) of the Act, 21 U.S.C. section 360bbb-3(b)(1), unless the authorization is terminated or revoked sooner.    Influenza  A by PCR NEGATIVE NEGATIVE   Influenza B by PCR NEGATIVE NEGATIVE    Comment: (NOTE) The Xpert Xpress SARS-CoV-2/FLU/RSV assay is intended as an aid in  the diagnosis of influenza from Nasopharyngeal swab specimens and  should not be used as a sole basis for treatment. Nasal washings and  aspirates are unacceptable for Xpert Xpress SARS-CoV-2/FLU/RSV  testing. Fact Sheet for Patients: https://www.moore.com/ Fact Sheet for Healthcare Providers: https://www.young.biz/ This test is not yet approved or cleared by the [place] FDA and  has been authorized for detection and/or diagnosis of SARS-CoV-2 by  FDA under an Emergency Use Authorization (EUA). This EUA will remain  in effect (meaning this test can be used) for the duration of the  Covid-19 declaration under Section 564(b)(1) of the Act, 21  U.S.C. section 360bbb-3(b)(1), unless the authorization is  terminated or revoked. Performed at [facility], [address] [place], [place] [address]     No current facility-administered medications for this encounter.   Current Outpatient Medications  Medication Sig Dispense Refill  . diphenhydrAMINE (BENADRYL) 25 mg capsule Take 1 capsule (25 mg total) by mouth every 4 (four) hours as needed. 10 capsule 0  . doxycycline (VIBRA-TABS) 100 MG tablet Take 1 tablet (100 mg total) by mouth 2 (two) times daily. (Patient not taking: Reported on 11/04/2019) 20 tablet 0  . naphazoline-glycerin (CLEAR EYES REDNESS) 0.012-0.2 % SOLN Place 1-2 drops into both eyes 4 (four) times daily as needed for eye irritation. 15 mL 0    Musculoskeletal: Strength & Muscle Tone:  within normal limits Gait & Station: normal Patient leans: N/A  Psychiatric Specialty Exam: Physical Exam  Nursing note and vitals reviewed. Constitutional: She appears well-developed and well-nourished.  Cardiovascular: Normal rate.  Respiratory: Effort normal.  Musculoskeletal:         General: Normal range of motion.     Cervical back: Normal range of motion and neck supple.  Psychiatric: Her behavior is normal.    Review of Systems  Psychiatric/Behavioral: Positive for confusion and hallucinations. The patient is nervous/anxious.   All other systems reviewed and are negative.   Blood pressure 117/86, pulse 91, temperature 98.7 F (37.1 C), temperature source Oral, resp. rate 18, height 5\' 1"  (1.549 m), weight 52.2 kg, SpO2 100 %, unknown if currently breastfeeding.Body mass index is 21.73 kg/m.  General Appearance: Disheveled  Eye Contact:  Minimal  Speech:  Clear and Coherent  Volume:  Increased  Mood:  Angry and Anxious  Affect:  Congruent  Thought Process:  Coherent  Orientation:  Full (Time, Place, and Person)  Thought Content:  Logical  Suicidal Thoughts:  No  Homicidal Thoughts:  No  Memory:  Immediate;   Good Recent;   Good Remote;   Good  Judgement:  Fair  Insight:  Shallow  Psychomotor Activity:  Normal  Concentration:  Concentration: Fair and Attention Span: Fair  Recall:  [employer] of Knowledge:  Fair  Language:  Good  Akathisia:  Negative  Handed:  Right  AIMS (if indicated):     Assets:  Communication Skills Desire for Improvement Social Support  ADL's:  Intact  Cognition:  WNL  Sleep:    Okay     Treatment Plan Summary: Daily contact with patient to assess and evaluate symptoms and progress in treatment, Medication management and Plan The patient will be observed overnight and reassess in the a.m. to determine if she meets criteria for psychiatric inpatient admission.  Disposition: Supportive therapy provided about ongoing stressors. The patient will remain under observation overnight and reassess in the a.m. to determine if she meets criteria for psychiatric inpatient admission.  Gillermo Murdoch, NP 02/28/2020 2:13 AM

## 2020-02-28 NOTE — ED Notes (Signed)
Patient dressed out by this Clinical research associate and ED tech Beth in bathroom. Patient belongings placed in belongings bag and labeled by this Clinical research associate.  1-pair jeans with ripped left leg 1- black leggings 1 sweatshirt 1-tank top with attached sports bra 1- brown boots 1-black colored cell phone 1- charging cord to cell phone  Patient unable to take off rings on left 4th finger or nose ring.

## 2020-04-01 ENCOUNTER — Emergency Department
Admission: EM | Admit: 2020-04-01 | Discharge: 2020-04-01 | Disposition: A | Discharge status: Home / Self Care | Payer: Medicaid Other | Attending: Emergency Medicine | Admitting: Emergency Medicine

## 2020-04-01 ENCOUNTER — Other Ambulatory Visit: Payer: Self-pay

## 2020-04-01 ENCOUNTER — Encounter: Payer: Self-pay | Admitting: Emergency Medicine

## 2020-04-01 DIAGNOSIS — J45909 Unspecified asthma, uncomplicated: Secondary | ICD-10-CM | POA: Insufficient documentation

## 2020-04-01 DIAGNOSIS — Z590 Homelessness: Secondary | ICD-10-CM | POA: Diagnosis not present

## 2020-04-01 DIAGNOSIS — R21 Rash and other nonspecific skin eruption: Secondary | ICD-10-CM | POA: Diagnosis present

## 2020-04-01 DIAGNOSIS — F329 Major depressive disorder, single episode, unspecified: Secondary | ICD-10-CM | POA: Insufficient documentation

## 2020-04-01 DIAGNOSIS — N898 Other specified noninflammatory disorders of vagina: Secondary | ICD-10-CM | POA: Insufficient documentation

## 2020-04-01 DIAGNOSIS — L309 Dermatitis, unspecified: Secondary | ICD-10-CM | POA: Diagnosis not present

## 2020-04-01 DIAGNOSIS — Z87891 Personal history of nicotine dependence: Secondary | ICD-10-CM | POA: Insufficient documentation

## 2020-04-01 LAB — URINE DRUG SCREEN, QUALITATIVE (ARMC ONLY)
Amphetamines, Ur Screen: NOT DETECTED
Barbiturates, Ur Screen: NOT DETECTED
Benzodiazepine, Ur Scrn: NOT DETECTED
Cannabinoid 50 Ng, Ur ~~LOC~~: NOT DETECTED
Cocaine Metabolite,Ur ~~LOC~~: POSITIVE — AB
MDMA (Ecstasy)Ur Screen: NOT DETECTED
Methadone Scn, Ur: NOT DETECTED
Opiate, Ur Screen: NOT DETECTED
Phencyclidine (PCP) Ur S: NOT DETECTED
Tricyclic, Ur Screen: NOT DETECTED

## 2020-04-01 LAB — COMPREHENSIVE METABOLIC PANEL
ALT: 10 U/L (ref 0–44)
AST: 23 U/L (ref 15–41)
Albumin: 4 g/dL (ref 3.5–5.0)
Alkaline Phosphatase: 50 U/L (ref 38–126)
Anion gap: 10 (ref 5–15)
BUN: 14 mg/dL (ref 6–20)
CO2: 22 mmol/L (ref 22–32)
Calcium: 9 mg/dL (ref 8.9–10.3)
Chloride: 107 mmol/L (ref 98–111)
Creatinine, Ser: 0.69 mg/dL (ref 0.44–1.00)
GFR calc Af Amer: >60 mL/min (ref >60)
GFR calc non Af Amer: >60 mL/min (ref >60)
Glucose, Bld: 105 mg/dL — ABNORMAL HIGH (ref 70–99)
Potassium: 3.8 mmol/L (ref 3.5–5.1)
Sodium: 139 mmol/L (ref 135–145)
Total Bilirubin: 1.6 mg/dL — ABNORMAL HIGH (ref 0.3–1.2)
Total Protein: 7.4 g/dL (ref 6.5–8.1)

## 2020-04-01 LAB — URINALYSIS, COMPLETE (UACMP) WITH MICROSCOPIC
Bacteria, UA: NONE SEEN
Bilirubin Urine: NEGATIVE
Glucose, UA: NEGATIVE mg/dL
Hgb urine dipstick: NEGATIVE
Ketones, ur: NEGATIVE mg/dL
Nitrite: NEGATIVE
Protein, ur: NEGATIVE mg/dL
Specific Gravity, Urine: 1.027 (ref 1.005–1.030)
pH: 5 (ref 5.0–8.0)

## 2020-04-01 LAB — WET PREP, GENITAL
Clue Cells Wet Prep HPF POC: NONE SEEN
Sperm: NONE SEEN
Trich, Wet Prep: NONE SEEN
Yeast Wet Prep HPF POC: NONE SEEN

## 2020-04-01 LAB — CHLAMYDIA/NGC RT PCR (ARMC ONLY)
Chlamydia Tr: NOT DETECTED
N gonorrhoeae: NOT DETECTED

## 2020-04-01 LAB — ETHANOL: Alcohol, Ethyl (B): <10 mg/dL (ref <10)

## 2020-04-01 LAB — PREGNANCY, URINE: Preg Test, Ur: NEGATIVE

## 2020-04-01 MED ORDER — EUCERIN EX CREA: TOPICAL_CREAM | CUTANEOUS | 0 refills | Status: DC | PRN

## 2020-04-01 NOTE — ED Notes (Signed)
Lab called and ua and urine preg added to urine specimen sent.

## 2020-04-01 NOTE — ED Notes (Signed)
Patient in room singing very loudly. Urine specimen pending. Safety maintained.

## 2020-04-01 NOTE — ED Triage Notes (Signed)
Pt to ER with rapid pressured speech.  States she has a "rare strain disease and she needs to go under the knife". When asked what kind of symptoms she has, pt shows RN a rash that "I've had for 30 years, but I just found out today that if I don't get it taken care of today I will die."  When asked who told her this, pt states "an anointed spirit."  "I think I need [employer] to come in and look at it, they need to go immunize my house."

## 2020-04-01 NOTE — ED Notes (Signed)
Assumed acre of patient patient reports skin problems , states this problems comes and goes,patient showing Md her abd  And hands, fingers are dry and cracked, patient reports does not think its eczema, states she thinks she needs ABX. Patient also expressed concern with vag discharge. Patient very repetitive about her skin condition "suggesting she may need someone to cut a piece of her skin and evaluate it for diseases". When asked what she meant by that patient reported something like a biopsy. When asked if she was hearing voices patient denies, denies SI/HI as well. Patient provided swabs for STD specimen collection.

## 2020-04-01 NOTE — Discharge Instructions (Addendum)
You should call the above to schedule dermatology appointment.  In the meantime you can use this cream to help.  No signs of UTI or cervical infection/STD.

## 2020-04-01 NOTE — ED Provider Notes (Signed)
[facility] Emergency Department Provider Note  ____________________________________________   First MD Initiated Contact with Patient 04/01/20 1256     (approximate)  I have reviewed the triage vital signs and the nursing notes.   HISTORY  Chief Complaint Psychiatric Evaluation    HPI Bridget Carpenter is a 29 y.o. female with history of PTSD, schizoaffective who comes in for psychiatric evaluation.  In triage patient was sitting she has a "rare strain of disease and she needs to go under the knife".   I discussed with patient who states that she has a history of eczema and she is had this rash for 30 years.  She is concerned that it might be infected.  She denies any redness or warmth of the skin.  She has not put any lotions on it.  She is not seeing dermatologist.  When I asked her if she said that she needed to go into the knife she clarified and stated that she needed to have someone take a piece out to test it to see what is causing this.  I asked her if she met like a biopsy and she did state that that is exactly what she meant.  I asked if she was going to use a knife to try to do that herself or to hurt herself and she stated no I would like to do that.  I need to see somebody who can do it for me so that I can get better.  I want to try to help myself not hurt myself.  I asked if she is ever seen a dermatologist and she stated no that is exactly what I would like to be able to see.  I asked if she any thoughts of killing herself and she adamantly said no and that she was just trying to get help her rash and wanted to make sure that everything looked okay.  She does report a little bit of vaginal discharge as well concern for possible STD.          Past Medical History:  Diagnosis Date  . Asthma   . Depression 2009   Inpatient psych admission for SI, dissociative fugue  . Dissociative disorder or reaction 2009  . Eczema   . H/O: suicide  attempt   . ODD (oppositional defiant disorder)   . PTSD (post-traumatic stress disorder)   . Schizoaffective disorder ([place])   . Substance abuse [facility])     Patient Active Problem List   Diagnosis Date Noted  . Adjustment disorder   . MDD (major depressive disorder), recurrent episode, severe ([place]) 08/31/2019  . Indication for care in labor and delivery, antepartum 08/29/2019  . Hydrops fetalis 08/29/2019  . Indication for care in labor or delivery 08/14/2019  . Dehydration 08/14/2019  . Rubella non-immune status, antepartum 08/01/2019  . Pain of round ligament affecting pregnancy, antepartum 08/01/2019  . Labor and delivery indication for care or intervention 07/30/2019  . Homelessness 07/25/2019  . Decreased fetal movement 07/02/2019  . Club foot, fetal, affecting care of mother, antepartum 06/29/2019  . Supervision of high risk pregnancy, antepartum 06/19/2019  . Cocaine abuse with cocaine-induced mood disorder ([place]) 04/15/2019  . Psychoactive substance-induced psychosis ([place]) 05/03/2018  . Bacterial vaginosis 12/25/2017  . PTSD (post-traumatic stress disorder) 09/02/2017  . MRSA carrier 07/18/2017  . Overdose 07/16/2017  . Aspiration pneumonia ([place]) 07/16/2017  . Acute respiratory failure ([place]) 07/16/2017  . Polysubstance abuse ([place]) 07/16/2017  . Eczema 05/08/2017  .  Borderline personality disorder ([place]) 11/06/2016  . Cocaine use disorder, severe, dependence ([place]) 11/06/2016  . Cannabis use disorder, moderate, dependence ([place]) 11/06/2016  . Alcohol use disorder, mild, abuse 11/06/2016  . Asthma 09/23/2011  . Tobacco use disorder 09/15/2009    Past Surgical History:  Procedure Laterality Date  . ADENOIDECTOMY    . TONSILLECTOMY      Prior to Admission medications   Medication Sig Start Date End Date Taking? Authorizing Provider  diphenhydrAMINE (BENADRYL) 25 mg capsule Take 1 capsule (25 mg total) by mouth every 4 (four) hours as needed. Patient not taking: Reported  on 02/28/2020 11/04/19 11/03/20  Laban Emperor, PA-C  doxycycline (VIBRA-TABS) 100 MG tablet Take 1 tablet (100 mg total) by mouth 2 (two) times daily. Patient not taking: Reported on 11/04/2019 10/11/19   Harvest Dark, MD  naphazoline-glycerin (CLEAR EYES REDNESS) 0.012-0.2 % SOLN Place 1-2 drops into both eyes 4 (four) times daily as needed for eye irritation. Patient not taking: Reported on 02/28/2020 02/03/20   Arta Silence, MD    Allergies Banana, Cantaloupe (diagnostic), Grapefruit extract, and Albumin human  Family History  Adopted: Yes  Problem Relation Age of Onset  . Mental illness Mother   . Mental illness Father   . Heart disease Maternal Grandmother   . Sickle cell trait Adoptive Mother   . Cleft palate Cousin        maternal side    Social History Social History   Tobacco Use  . Smoking status: Former Smoker    Packs/day: 0.75    Years: 2.00    Pack years: 1.50    Types: Cigarettes  . Smokeless tobacco: Never Used  . Tobacco comment: Patient states she quit smoking "a long time ago"  Substance Use Topics  . Alcohol use: Not Currently    Alcohol/week: 1.0 standard drinks    Types: 1 Cans of beer per week  . Drug use: Not Currently    Types: Marijuana, "Crack" cocaine, Heroin      Review of Systems Constitutional: No fever/chills Eyes: No visual changes. ENT: No sore throat. Cardiovascular: Denies chest pain. Respiratory: Denies shortness of breath. Gastrointestinal: No abdominal pain.  No nausea, no vomiting.  No diarrhea.  No constipation. Genitourinary: Vaginal discharge Musculoskeletal: Negative for back pain. Skin: Positive rash Neurological: Negative for headaches, focal weakness or numbness. All other ROS negative ____________________________________________   PHYSICAL EXAM:  VITAL SIGNS: ED Triage Vitals [04/01/20 1230]  Enc Vitals Group     BP (!) 108/55     Pulse Rate 70     Resp 18     Temp 97.6 F (36.4 C)     Temp Source  Oral     SpO2 100 %     Weight 115 lb 1.3 oz (52.2 kg)     Height '5\' 1"'$  (1.549 m)     Head Circumference      Peak Flow      Pain Score 0     Pain Loc      Pain Edu?      Excl. in [place]?     Constitutional: Alert and oriented. Well appearing and in no acute distress. Eyes: Conjunctivae are normal. EOMI. Head: Atraumatic. Nose: No congestion/rhinnorhea. Mouth/Throat: Mucous membranes are moist.   Neck: No stridor. Trachea Midline. FROM Cardiovascular: Normal rate, regular rhythm. Grossly normal heart sounds.  Good peripheral circulation. Respiratory: Normal respiratory effort.  No retractions. Lungs CTAB. Gastrointestinal: Soft and nontender. No distention. No abdominal bruits.  Musculoskeletal:  No lower extremity tenderness nor edema.  No joint effusions. Neurologic:  Normal speech and language. No gross focal neurologic deficits are appreciated.  Skin: Patient is a rash noted on her abdomen and along her fingertips that is consistent with her known eczema.  There is no redness noted to it no warmth. Psychiatric: Pressured speech but denies SI, HI, auditory visual hallucinations. GU: Deferred   ____________________________________________   LABS (all labs ordered are listed, but only abnormal results are displayed)  Labs Reviewed  WET PREP, GENITAL - Abnormal; Notable for the following components:      Result Value   WBC, Wet Prep HPF POC FEW (*)    All other components within normal limits  COMPREHENSIVE METABOLIC PANEL - Abnormal; Notable for the following components:   Glucose, Bld 105 (*)    Total Bilirubin 1.6 (*)    All other components within normal limits  CHLAMYDIA/NGC RT PCR ([facility] ONLY)  ETHANOL  URINE DRUG SCREEN, QUALITATIVE ([facility] ONLY)  CBC  POC URINE PREG, ED   ______   PROCEDURES  Procedure(s) performed (including Critical Care):  Procedures   ____________________________________________   INITIAL IMPRESSION / ASSESSMENT AND PLAN / ED  COURSE  Bridget Shayan Wyatt was evaluated in Emergency Department on 04/01/2020 for the symptoms described in the history of present illness. She was evaluated in the context of the global COVID-19 pandemic, which necessitated consideration that the patient might be at risk for infection with the SARS-CoV-2 virus that causes COVID-19. Institutional protocols and algorithms that pertain to the evaluation of patients at risk for COVID-19 are in a state of rapid change based on information released by regulatory bodies including the CDC and federal and state organizations. These policies and algorithms were followed during the patient's care in the ED.    Patient is well-known to our emergency room.  She presents many times with her baseline pressured speech.  At this time she is not having any SI.  She denies she would make any attempt to try to cut the skin out of her and actually she would just want to see somebody to do a biopsy to make sure there is nothing else going on.  She denies suicidal or homicidal ideations.  At this time I do not think she meets IVC criteria.  I have seen her previously and this appears to be her baseline.  She is not interested in psychiatric evaluation and she feels at her baseline self.  At this time I do not think IVC and psychiatric consultation would be appropriate  For patient's rash I do think it is consistent with her known eczema.  Will prescribe some Eucerin cream.  We will also give patient dermatology's number for follow-up.  She has no fever or redness of the rash to suggest a coinfection or cellulitis.  For her concern for her vaginal discharge we will send off gonorrhea chlamydia and wet prep.   Reevaluated patient and she is continues to state that she is not having any SI.  She states that she just wants to get better.  She asked me if she needs antibiotics because she is concerned that this could be infected.  Discussed with patient that she is afebrile and  the rash does not look infected so we can hold off on antibiotics.  Patient handed off to oncoming team pending UA, gonorrhea and chlamydia and if negative most likely discharge home.  This time she does not meet IVC criteria  ____________________________________________   FINAL CLINICAL IMPRESSION(S) / ED DIAGNOSES   Final diagnoses:  Eczema, unspecified type      MEDICATIONS GIVEN DURING THIS VISIT:  Medications - No data to display   ED Discharge Orders         Ordered    Skin Protectants, Misc. (EUCERIN) cream  As needed     04/01/20 1445           Note:  This document was prepared using Dragon voice recognition software and may include unintentional dictation errors.   Vanessa , MD 04/01/20 1500

## 2020-04-03 ENCOUNTER — Emergency Department
Admission: EM | Admit: 2020-04-03 | Discharge: 2020-04-05 | Disposition: A | Discharge status: Home / Self Care | Payer: Medicaid Other | Attending: Emergency Medicine | Admitting: Emergency Medicine

## 2020-04-03 DIAGNOSIS — F14988 Cocaine use, unspecified with other cocaine-induced disorder: Secondary | ICD-10-CM | POA: Diagnosis not present

## 2020-04-03 DIAGNOSIS — J45909 Unspecified asthma, uncomplicated: Secondary | ICD-10-CM | POA: Diagnosis not present

## 2020-04-03 DIAGNOSIS — Z20822 Contact with and (suspected) exposure to covid-19: Secondary | ICD-10-CM | POA: Insufficient documentation

## 2020-04-03 DIAGNOSIS — F332 Major depressive disorder, recurrent severe without psychotic features: Secondary | ICD-10-CM | POA: Diagnosis present

## 2020-04-03 DIAGNOSIS — Z781 Physical restraint status: Secondary | ICD-10-CM | POA: Insufficient documentation

## 2020-04-03 DIAGNOSIS — Z046 Encounter for general psychiatric examination, requested by authority: Secondary | ICD-10-CM | POA: Insufficient documentation

## 2020-04-03 DIAGNOSIS — R4182 Altered mental status, unspecified: Secondary | ICD-10-CM | POA: Diagnosis present

## 2020-04-03 DIAGNOSIS — Z87891 Personal history of nicotine dependence: Secondary | ICD-10-CM | POA: Insufficient documentation

## 2020-04-03 DIAGNOSIS — F1414 Cocaine abuse with cocaine-induced mood disorder: Secondary | ICD-10-CM | POA: Diagnosis present

## 2020-04-03 DIAGNOSIS — F259 Schizoaffective disorder, unspecified: Secondary | ICD-10-CM | POA: Diagnosis not present

## 2020-04-03 DIAGNOSIS — F1910 Other psychoactive substance abuse, uncomplicated: Secondary | ICD-10-CM | POA: Diagnosis present

## 2020-04-03 DIAGNOSIS — F23 Brief psychotic disorder: Secondary | ICD-10-CM | POA: Insufficient documentation

## 2020-04-03 DIAGNOSIS — F4320 Adjustment disorder, unspecified: Secondary | ICD-10-CM | POA: Diagnosis present

## 2020-04-03 DIAGNOSIS — Z5900 Homelessness unspecified: Secondary | ICD-10-CM

## 2020-04-03 DIAGNOSIS — O368190 Decreased fetal movements, unspecified trimester, not applicable or unspecified: Secondary | ICD-10-CM | POA: Diagnosis present

## 2020-04-03 DIAGNOSIS — F1420 Cocaine dependence, uncomplicated: Secondary | ICD-10-CM | POA: Diagnosis present

## 2020-04-03 DIAGNOSIS — F1220 Cannabis dependence, uncomplicated: Secondary | ICD-10-CM | POA: Diagnosis present

## 2020-04-03 DIAGNOSIS — F1010 Alcohol abuse, uncomplicated: Secondary | ICD-10-CM | POA: Diagnosis present

## 2020-04-03 DIAGNOSIS — F603 Borderline personality disorder: Secondary | ICD-10-CM | POA: Diagnosis present

## 2020-04-03 DIAGNOSIS — F17200 Nicotine dependence, unspecified, uncomplicated: Secondary | ICD-10-CM | POA: Diagnosis present

## 2020-04-03 LAB — CBC WITH DIFFERENTIAL/PLATELET
Abs Immature Granulocytes: 0.08 10*3/uL — ABNORMAL HIGH (ref 0.00–0.07)
Basophils Absolute: 0.1 10*3/uL (ref 0.0–0.1)
Basophils Relative: 0 %
Eosinophils Absolute: 0 10*3/uL (ref 0.0–0.5)
Eosinophils Relative: 0 %
HCT: 39 % (ref 36.0–46.0)
Hemoglobin: 12.8 g/dL (ref 12.0–15.0)
Immature Granulocytes: 1 %
Lymphocytes Relative: 7 %
Lymphs Abs: 1.1 10*3/uL (ref 0.7–4.0)
MCH: 31.3 pg (ref 26.0–34.0)
MCHC: 32.8 g/dL (ref 30.0–36.0)
MCV: 95.4 fL (ref 80.0–100.0)
Monocytes Absolute: 1.1 10*3/uL — ABNORMAL HIGH (ref 0.1–1.0)
Monocytes Relative: 7 %
Neutro Abs: 14.3 10*3/uL — ABNORMAL HIGH (ref 1.7–7.7)
Neutrophils Relative %: 85 %
Platelets: 230 10*3/uL (ref 150–400)
RBC: 4.09 MIL/uL (ref 3.87–5.11)
RDW: 13.1 % (ref 11.5–15.5)
WBC: 16.6 10*3/uL — ABNORMAL HIGH (ref 4.0–10.5)
nRBC: 0 % (ref 0.0–0.2)

## 2020-04-03 LAB — COMPREHENSIVE METABOLIC PANEL
ALT: 12 U/L (ref 0–44)
AST: 29 U/L (ref 15–41)
Albumin: 4.8 g/dL (ref 3.5–5.0)
Alkaline Phosphatase: 60 U/L (ref 38–126)
Anion gap: 15 (ref 5–15)
BUN: 24 mg/dL — ABNORMAL HIGH (ref 6–20)
CO2: 21 mmol/L — ABNORMAL LOW (ref 22–32)
Calcium: 9.8 mg/dL (ref 8.9–10.3)
Chloride: 109 mmol/L (ref 98–111)
Creatinine, Ser: 1.57 mg/dL — ABNORMAL HIGH (ref 0.44–1.00)
GFR calc Af Amer: 51 mL/min — ABNORMAL LOW (ref >60)
GFR calc non Af Amer: 44 mL/min — ABNORMAL LOW (ref >60)
Glucose, Bld: 106 mg/dL — ABNORMAL HIGH (ref 70–99)
Potassium: 3.9 mmol/L (ref 3.5–5.1)
Sodium: 145 mmol/L (ref 135–145)
Total Bilirubin: 1.6 mg/dL — ABNORMAL HIGH (ref 0.3–1.2)
Total Protein: 8.5 g/dL — ABNORMAL HIGH (ref 6.5–8.1)

## 2020-04-03 LAB — URINE DRUG SCREEN, QUALITATIVE (ARMC ONLY)
Amphetamines, Ur Screen: NOT DETECTED
Barbiturates, Ur Screen: NOT DETECTED
Benzodiazepine, Ur Scrn: NOT DETECTED
Cannabinoid 50 Ng, Ur ~~LOC~~: NOT DETECTED
Cocaine Metabolite,Ur ~~LOC~~: POSITIVE — AB
MDMA (Ecstasy)Ur Screen: NOT DETECTED
Methadone Scn, Ur: NOT DETECTED
Opiate, Ur Screen: NOT DETECTED
Phencyclidine (PCP) Ur S: NOT DETECTED
Tricyclic, Ur Screen: NOT DETECTED

## 2020-04-03 LAB — CK: Total CK: 425 U/L — ABNORMAL HIGH (ref 38–234)

## 2020-04-03 LAB — HCG, QUANTITATIVE, PREGNANCY: hCG, Beta Chain, Quant, S: <1 m[IU]/mL (ref <5)

## 2020-04-03 LAB — ETHANOL: Alcohol, Ethyl (B): <10 mg/dL (ref <10)

## 2020-04-03 MED ORDER — LORAZEPAM 2 MG/ML IJ SOLN
INTRAMUSCULAR | Status: AC
  Administered 2020-04-03: 12:00:00 2 mg via INTRAMUSCULAR
  Filled 2020-04-03: qty 1

## 2020-04-03 MED ORDER — LORAZEPAM 2 MG/ML IJ SOLN: 2.0000 mg | Freq: Once | INTRAMUSCULAR | Status: AC

## 2020-04-03 MED ORDER — HALOPERIDOL LACTATE 5 MG/ML IJ SOLN: 10.0000 mg | Freq: Once | INTRAMUSCULAR | Status: AC

## 2020-04-03 MED ORDER — DIPHENHYDRAMINE HCL 50 MG/ML IJ SOLN: 50.0000 mg | Freq: Once | INTRAMUSCULAR | Status: AC

## 2020-04-03 MED ORDER — DIPHENHYDRAMINE HCL 50 MG/ML IJ SOLN
INTRAMUSCULAR | Status: AC
  Administered 2020-04-03: 50 mg via INTRAMUSCULAR
  Filled 2020-04-03: qty 1

## 2020-04-03 MED ORDER — HALOPERIDOL LACTATE 5 MG/ML IJ SOLN
INTRAMUSCULAR | Status: AC
  Administered 2020-04-03: 10 mg via INTRAMUSCULAR
  Filled 2020-04-03: qty 2

## 2020-04-03 MED ORDER — SODIUM CHLORIDE 0.9 % IV SOLN: Freq: Once | INTRAVENOUS | Status: AC

## 2020-04-03 NOTE — Consult Note (Signed)
  Patient is a 29 year old female well-known to psychiatric services for multiple presentations typically in the context of drug use.  Today patient was brought in by police in handcuffs as she was resisting the entire time.  Patient was screaming "Jehovah" as well as displayed extremely paranoid and bizarre behavior thought to be psychotic in origin.  Unclear at this time whether or not patient took drugs to cause this behavior.  Patient was totally inconsolable, unable to be verbally redirected, was placed in restraints and given IM medications.  Patient fell asleep shortly after that.  Psychiatry's plan is to wait for [facility] to come back and for patient to stabilize prior to reassessment.

## 2020-04-03 NOTE — BH Assessment (Signed)
Assessment Note  [name] is an 29 y.o. female presenting to [facility] ED under IVC, per triage note Patient found wondering the street asking strangers for rides, got into a car and person drove up to a police officer and asked that patient be removed out of his car. Patient with bizarre behavior, preaching johova, speaking fast and resisting to follow commands. During assessment patient appeared to be shivering, speech was pressured and slurred but was alert and motor activity was restless.Per Psyc MD Dr. Viviano Simas  Earlier in the day Patient is a 29 year old female well-known to psychiatric services for multiple presentations typically in the context of drug use. Today patient was brought in by police in handcuffs as she was resisting the entire time.  Patient was screaming "Jehovah" as well as displayed extremely paranoid and bizarre behavior thought to be psychotic in origin.  Unclear at this time whether or not patient took drugs to cause this behavior. Patient was totally inconsolable, unable to be verbally redirected, was placed in restraints and given IM medications.  Patient fell asleep shortly after that. Patient was unable to provide much insight to how she had gotten to the ED, but was able to deny SI/HI/AH/VH. Patient UDS positive for Cocaine.   Per Psyc NP patient will remain under observation and reassessed in the morning.    Diagnosis: Cocaine Use Disorder Severe, Schizoaffective Disorder by history  Past Medical History:  Past Medical History:  Diagnosis Date  . Asthma   . Depression 2009   Inpatient psych admission for SI, dissociative fugue  . Dissociative disorder or reaction 2009  . Eczema   . H/O: suicide attempt   . ODD (oppositional defiant disorder)   . PTSD (post-traumatic stress disorder)   . Schizoaffective disorder ([facility])   . Substance abuse [facility])     Past Surgical History:  Procedure Laterality Date  . ADENOIDECTOMY    . TONSILLECTOMY      Family  History:  Family History  Adopted: Yes  Problem Relation Age of Onset  . Mental illness Mother   . Mental illness Father   . Heart disease Maternal Grandmother   . Sickle cell trait Adoptive Mother   . Cleft palate Cousin        maternal side    Social History:  reports that she has quit smoking. Her smoking use included cigarettes. She has a 1.50 pack-year smoking history. She has never used smokeless tobacco. She reports previous alcohol use of about 1.0 standard drinks of alcohol per week. She reports previous drug use. Drugs: Marijuana, "Crack" cocaine, and Heroin.  Additional Social History:  Alcohol / Drug Use Pain Medications: See MAR Prescriptions: See MAR Over the Counter: See MAR History of alcohol / drug use?: Yes Substance #1 Name of Substance 1: Cocaine  CIWA: CIWA-Ar BP: 140/84 Pulse Rate: 96 COWS:    Allergies:  Allergies  Allergen Reactions  . Banana Hives and Swelling  . Cantaloupe (Diagnostic) Hives  . Grapefruit Extract Itching and Swelling  . Albumin Human Other (See Comments)    Pt refuses all blood products.     Home Medications: (Not in a hospital admission)   OB/GYN Status:  No LMP recorded (lmp unknown).  General Assessment Data Location of Assessment: [facility] ED TTS Assessment: In system Is this a Tele or Face-to-Face Assessment?: Face-to-Face Is this an Initial Assessment or a Re-assessment for this encounter?: Initial Assessment Patient Accompanied by:: N/A Language Other than English: No Living Arrangements: Other (Comment) What  gender do you identify as?: Female Marital status: Single Living Arrangements: Other (Comment) Can pt return to current living arrangement?: Yes Admission Status: Involuntary Petitioner: Police Is patient capable of signing voluntary admission?: No Referral Source: Other Insurance type: Medicaid  Medical Screening Exam ([place]) Medical Exam completed: Yes  Crisis Care Plan Living Arrangements:  Other (Comment) Legal Guardian: Other:(Self) Name of Psychiatrist: None reported Name of Therapist: None reported  Education Status Is patient currently in school?: No Is the patient employed, unemployed or receiving disability?: Receiving disability income  Risk to self with the past 6 months Suicidal Ideation: No Has patient been a risk to self within the past 6 months prior to admission? : No Suicidal Intent: No Has patient had any suicidal intent within the past 6 months prior to admission? : No Is patient at risk for suicide?: No Suicidal Plan?: No Has patient had any suicidal plan within the past 6 months prior to admission? : No Access to Means: No What has been your use of drugs/alcohol within the last 12 months?: Cocaine Previous Attempts/Gestures: No How many times?: 0 Other Self Harm Risks: None reported Triggers for Past Attempts: None known Intentional Self Injurious Behavior: None Family Suicide History: Unknown Recent stressful life event(s): Other (Comment)(None reported) Persecutory voices/beliefs?: No Depression: No Substance abuse history and/or treatment for substance abuse?: Yes Suicide prevention information given to non-admitted patients: Not applicable  Risk to Others within the past 6 months Homicidal Ideation: No Does patient have any lifetime risk of violence toward others beyond the six months prior to admission? : No Thoughts of Harm to Others: No Current Homicidal Intent: No Current Homicidal Plan: No Access to Homicidal Means: No History of harm to others?: No Assessment of Violence: None Noted Does patient have access to weapons?: No Criminal Charges Pending?: No Does patient have a court date: No Is patient on probation?: No  Psychosis Hallucinations: None noted Delusions: None noted  Mental Status Report Appearance/Hygiene: In scrubs Eye Contact: Poor Motor Activity: Freedom of movement, Restlessness Speech: Pressured,  Slurred Level of Consciousness: Restless, Drowsy Mood: Pleasant Affect: Appropriate to circumstance Anxiety Level: Minimal Thought Processes: Coherent Judgement: Partial Orientation: Person, Place, Time, Situation, Appropriate for developmental age Obsessive Compulsive Thoughts/Behaviors: None  Cognitive Functioning Concentration: Normal Memory: Recent Intact, Remote Intact Is patient IDD: No Insight: Poor Impulse Control: Fair Appetite: Fair Have you had any weight changes? : No Change Sleep: Decreased Total Hours of Sleep: 4  ADLScreening [facility] Assessment Services) Patient's cognitive ability adequate to safely complete daily activities?: Yes Patient able to express need for assistance with ADLs?: Yes Independently performs ADLs?: Yes (appropriate for developmental age)  Prior Inpatient Therapy Prior Inpatient Therapy: Yes Prior Therapy Dates: 12/04/2018, 05/03/2018, 12/25/2017, 07/18/2017, 05/07/2017 Prior Therapy Facilty/Provider(s): [facility] Reason for Treatment: Schizoaffective Disorder, Cocaine Abuse  Prior Outpatient Therapy Prior Outpatient Therapy: No Does patient have an ACCT team?: No Does patient have Intensive In-House Services?  : No Does patient have [facility] services? : No Does patient have P4CC services?: No  ADL Screening (condition at time of admission) Patient's cognitive ability adequate to safely complete daily activities?: Yes Is the patient deaf or have difficulty hearing?: No Does the patient have difficulty seeing, even when wearing glasses/contacts?: No Does the patient have difficulty concentrating, remembering, or making decisions?: No Patient able to express need for assistance with ADLs?: Yes Does the patient have difficulty dressing or bathing?: No Independently performs ADLs?: Yes (appropriate for developmental age) Does the patient  have difficulty walking or climbing stairs?: No Weakness of Legs: None Weakness of Arms/Hands: None  Home  Assistive Devices/Equipment Home Assistive Devices/Equipment: None  Therapy Consults (therapy consults require a physician order) PT Evaluation Needed: No OT Evalulation Needed: No SLP Evaluation Needed: No Abuse/Neglect Assessment (Assessment to be complete while patient is alone) Abuse/Neglect Assessment Can Be Completed: Yes Physical Abuse: Denies Verbal Abuse: Denies Sexual Abuse: Denies Exploitation of patient/patient's resources: Denies Self-Neglect: Denies Values / Beliefs Cultural Requests During Hospitalization: None Spiritual Requests During Hospitalization: None Consults Spiritual Care Consult Needed: No Transition of Care Team Consult Needed: No Advance Directives (For Healthcare) Does Patient Have a Medical Advance Directive?: No Would patient like information on creating a medical advance directive?: No - Patient declined          Disposition: Per Psyc NP patient will remain under observation and reassessed in the morning. Disposition Initial Assessment Completed for this Encounter: Yes  On Site Evaluation by:   Reviewed with Physician:    Benay Pike MS LCASA 04/03/2020 10:48 PM

## 2020-04-03 NOTE — ED Notes (Signed)
1:1 removed during prior shift. Patient currently resting with eyes closed respirations even and non labored. Unable to assess. Will continue to monitor.

## 2020-04-03 NOTE — ED Notes (Signed)
Patient sleeping restraints released. IV team consult placed.

## 2020-04-03 NOTE — ED Triage Notes (Signed)
Patient found wondering the street asking strangers for rides, got into a car and person drove up to a police officer and asked that patient be removed out of his car. Patient with bizarre behavior, preaching johova, speaking fast and resisting to follow commands.

## 2020-04-03 NOTE — ED Notes (Signed)
Pt standing at end of bed attempting to remove IV. Regulatory affairs officer and this tech to pt side. This tech assisted pt back to bed. Pt asking for a cup of water. Lattie Corns, RN made aware and pt was given a cup of water.

## 2020-04-03 NOTE — ED Notes (Signed)
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
## 2020-04-03 NOTE — ED Notes (Signed)
Patient sleeping comfortably, IVF bolus infusing

## 2020-04-03 NOTE — ED Notes (Signed)
Pt requesting something to drink and warm blankets. When this tech returned pt was fast asleep. Pt covered with warm blankets and ice water left at bedside. Will continue to monitor Q15 minute rounds.

## 2020-04-03 NOTE — ED Notes (Signed)
Pt has burgundy scrub top but still had her own pants and shorts. Pt changed into underwear, burgundy pants and socks. Pts belongings included:  Leggings and black shorts. Pt unable to take rings off. Selena Batten, RN notified. Urine sample collected at this time. Will continue to monitor Q15 minute rounds.
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
## 2020-04-03 NOTE — ED Notes (Signed)
Patient in bed slleeping IVF infusing. Occasionally jumps up out of bed confused and disoriented to place, patient easily re-directed bsck in bed. Safety maintained will monitor.

## 2020-04-03 NOTE — ED Notes (Signed)
IVC/Consult ordered/RN & Security aware of Legal status

## 2020-04-03 NOTE — ED Notes (Signed)
Sleeping comfortably 1:1 for safety

## 2020-04-04 LAB — RESPIRATORY PANEL BY RT PCR (FLU A&B, COVID)
Influenza A by PCR: NEGATIVE
Influenza B by PCR: NEGATIVE
SARS Coronavirus 2 by RT PCR: NEGATIVE

## 2020-04-04 NOTE — BH Assessment (Signed)
TTS attempted to complete the initial assessment with pt this morning and again later this afternoon but was unsuccessful due to pt's sleeping and being unable to participate.

## 2020-04-04 NOTE — ED Notes (Signed)
Patient up to restroom. This Clinical research associate gave new scrubs. Writer assisted patient on putting lotion on skin. Writer rubbed lotion on arms and back and instructed patient to put lotion on stomach and front of body. Patient's skin was very dry and ashy prior or lotion being applied.

## 2020-04-04 NOTE — ED Notes (Signed)
Pt. Currently sleeping in room BHU #4.

## 2020-04-04 NOTE — Consult Note (Signed)
[facility] Face-to-Face Psychiatry Consult   Reason for Consult: Altered mental status Referring Physician: Dr. Mayford Knife Patient Identification: [name] Shayan Rebert MRN:  [id number] Principal Diagnosis: <principal problem not specified> Diagnosis:  Active Problems:   Tobacco use disorder   Borderline personality disorder (HCC)   Cocaine use disorder, severe, dependence (HCC)   Cannabis use disorder, moderate, dependence (HCC)   Alcohol use disorder, mild, abuse   Polysubstance abuse (HCC)   Cocaine abuse with cocaine-induced mood disorder (HCC)   Decreased fetal movement   Homelessness   MDD (major depressive disorder), recurrent episode, severe (HCC)   Adjustment disorder   Total Time spent with patient: 30 minutes   Subjective: " The police beat me up today." [name] Kirah Stice is a 29 y.o. female presented to [facility] ED via law enforcement under involuntary commitment status (IVC). The patient was brought in due to her experiencing bizarre behavior screaming for Jehovah.  The patient is agitated and combative.  The patient was seen with generalized abrasion.  She voiced, "the police officers did this to me." The patient was seen face-to-face by this provider; chart reviewed and consulted with Dr. Roxan Hockey on 04/03/2020 due to the patient's care. It was discussed with the EDP that the patient is presenting to be under the influence of cocaine, which the patient denies. She would be observed overnight and reassess in the a.m. to determine if she meets the criteria for psychiatric inpatient admission or could be discharged back to the community. The patient is alert and oriented x3, calm, challenging to arouse but cooperative, and mood-congruent, affecting evaluation. The patient does appear to be responding to internal and external stimuli. She is presenting with delusional thinking. The patient denies auditory hallucinations but verbalized visual hallucinations, such as seeing lions and bears.  The patient denies suicidal, homicidal, or self-harm ideations. The patient is presenting with psychotic behaviors, but she is not exhibiting any paranoid behaviors. During an encounter with the patient, she was able to answer questions appropriately.  Plan: The patient will be observed overnight and reassess in the a.m. to determine if she meets the criteria for psychiatric inpatient admission or can be discharged back to the community. HPI: Per Dr. Mayford Knife; HPI [name] Bowlds is a 29 y.o. female with a history of asthma, depression, schizoaffective disorder, substance abuse, PTSD who presents to the ED for acute psychosis.  Patient presents with bizarre behavior screaming for Jehovah.  Patient is agitated and combative.  Past Psychiatric History:  Depression Dissociative disorder or reaction H/O: Suicide attempt ODD (oppositional defiant disorder) PTSD (post traumatic stress disorder) Schizoaffective disorder (HCC) Substance abuse (HCC)  Risk to Self: Suicidal Ideation: No Suicidal Intent: No Is patient at risk for suicide?: No Suicidal Plan?: No Access to Means: No What has been your use of drugs/alcohol within the last 12 months?: Cocaine How many times?: 0 Other Self Harm Risks: None reported Triggers for Past Attempts: None known Intentional Self Injurious Behavior: NoneNo Risk to Others: Homicidal Ideation: No Thoughts of Harm to Others: No Current Homicidal Intent: No Current Homicidal Plan: No Access to Homicidal Means: No History of harm to others?: No Assessment of Violence: None Noted Does patient have access to weapons?: No Criminal Charges Pending?: No Does patient have a court date: NoNo Prior Inpatient Therapy: Prior Inpatient Therapy: Yes Prior Therapy Dates: 12/04/2018, 05/03/2018, 12/25/2017, 07/18/2017, 05/07/2017 Prior Therapy Facilty/Provider(s): [facility] Reason for Treatment: Schizoaffective Disorder, Cocaine AbuseYes Prior Outpatient Therapy: Prior  Outpatient Therapy: No Does patient have an  ACCT team?: No Does patient have Intensive In-House Services?  : No Does patient have [facility] services? : No Does patient have P4CC services?: NoYes  Past Medical History:  Past Medical History:  Diagnosis Date  . Asthma   . Depression 2009   Inpatient psych admission for SI, dissociative fugue  . Dissociative disorder or reaction 2009  . Eczema   . H/O: suicide attempt   . ODD (oppositional defiant disorder)   . PTSD (post-traumatic stress disorder)   . Schizoaffective disorder (HCC)   . Substance abuse [facility])     Past Surgical History:  Procedure Laterality Date  . ADENOIDECTOMY    . TONSILLECTOMY     Family History:  Family History  Adopted: Yes  Problem Relation Age of Onset  . Mental illness Mother   . Mental illness Father   . Heart disease Maternal Grandmother   . Sickle cell trait Adoptive Mother   . Cleft palate Cousin        maternal side   Family Psychiatric  History:  Social History:  Social History   Substance and Sexual Activity  Alcohol Use Not Currently  . Alcohol/week: 1.0 standard drinks  . Types: 1 Cans of beer per week     Social History   Substance and Sexual Activity  Drug Use Not Currently  . Types: Marijuana, "Crack" cocaine, Heroin    Social History   Socioeconomic History  . Marital status: Single    Spouse name: Not on file  . Number of children: 1  . Years of education: Not on file  . Highest education level: Not on file  Occupational History  . Not on file  Tobacco Use  . Smoking status: Former Smoker    Packs/day: 0.75    Years: 2.00    Pack years: 1.50    Types: Cigarettes  . Smokeless tobacco: Never Used  . Tobacco comment: Patient states she quit smoking "a long time ago"  Substance and Sexual Activity  . Alcohol use: Not Currently    Alcohol/week: 1.0 standard drinks    Types: 1 Cans of beer per week  . Drug use: Not Currently    Types: Marijuana, "Crack" cocaine,  Heroin  . Sexual activity: Not Currently    Partners: Male  Other Topics Concern  . Not on file  Social History Narrative   Is homeless   Her mother is her daughter's gaurdian   Multiple psychiatric hospitalizations, not on any medication currently   Substance use disorder   Social Determinants of Health   Financial Resource Strain: High Risk  . Difficulty of Paying Living Expenses: Very hard  Food Insecurity: Food Insecurity Present  . Worried About Programme researcher, broadcasting/film/video in the Last Year: Often true  . Ran Out of Food in the Last Year: Often true  Transportation Needs: No Transportation Needs  . Lack of Transportation (Medical): No  . Lack of Transportation (Non-Medical): No  Physical Activity: Sufficiently Active  . Days of Exercise per Week: 4 days  . Minutes of Exercise per Session: 40 min  Stress: Stress Concern Present  . Feeling of Stress : Very much  Social Connections: Severely Isolated  . Frequency of Communication with Friends and Family: Never  . Frequency of Social Gatherings with Friends and Family: Never  . Attends Religious Services: Never  . Active Member of Clubs or Organizations: No  . Attends Banker Meetings: Never  . Marital Status: Never married   Additional Social History:  Allergies:   Allergies  Allergen Reactions  . Banana Hives and Swelling  . Cantaloupe (Diagnostic) Hives  . Grapefruit Extract Itching and Swelling  . Albumin Human Other (See Comments)    Pt refuses all blood products.     Labs:  Results for orders placed or performed during the hospital encounter of 04/03/20 (from the past 48 hour(s))  Urine Drug Screen, Qualitative ([facility] only)     Status: Abnormal   Collection Time: 04/03/20 11:37 AM  Result Value Ref Range   Tricyclic, Ur Screen NONE DETECTED NONE DETECTED   Amphetamines, Ur Screen NONE DETECTED NONE DETECTED   MDMA (Ecstasy)Ur Screen NONE DETECTED NONE DETECTED   Cocaine Metabolite,Ur [place] POSITIVE (A)  NONE DETECTED   Opiate, Ur Screen NONE DETECTED NONE DETECTED   Phencyclidine (PCP) Ur S NONE DETECTED NONE DETECTED   Cannabinoid 50 Ng, Ur [place] NONE DETECTED NONE DETECTED   Barbiturates, Ur Screen NONE DETECTED NONE DETECTED   Benzodiazepine, Ur Scrn NONE DETECTED NONE DETECTED   Methadone Scn, Ur NONE DETECTED NONE DETECTED    Comment: (NOTE) Tricyclics + metabolites, urine    Cutoff 1000 ng/mL Amphetamines + metabolites, urine  Cutoff 1000 ng/mL MDMA (Ecstasy), urine              Cutoff 500 ng/mL Cocaine Metabolite, urine          Cutoff 300 ng/mL Opiate + metabolites, urine        Cutoff 300 ng/mL Phencyclidine (PCP), urine         Cutoff 25 ng/mL Cannabinoid, urine                 Cutoff 50 ng/mL Barbiturates + metabolites, urine  Cutoff 200 ng/mL Benzodiazepine, urine              Cutoff 200 ng/mL Methadone, urine                   Cutoff 300 ng/mL The urine drug screen provides only a preliminary, unconfirmed analytical test result and should not be used for non-medical purposes. Clinical consideration and professional judgment should be applied to any positive drug screen result due to possible interfering substances. A more specific alternate chemical method must be used in order to obtain a confirmed analytical result. Gas chromatography / mass spectrometry (GC/MS) is the preferred confirmat ory method. Performed at [facility], [address] [place], [place] [address]   CBC with Differential/Platelet     Status: Abnormal   Collection Time: 04/03/20 12:25 PM  Result Value Ref Range   WBC 16.6 (H) 4.0 - 10.5 K/uL   RBC 4.09 3.87 - 5.11 MIL/uL   Hemoglobin 12.8 12.0 - 15.0 g/dL   HCT 39.7 67.3 - 41.9 %   MCV 95.4 80.0 - 100.0 fL   MCH 31.3 26.0 - 34.0 pg   MCHC 32.8 30.0 - 36.0 g/dL   RDW 37.9 02.4 - 09.7 %   Platelets 230 150 - 400 K/uL   nRBC 0.0 0.0 - 0.2 %   Neutrophils Relative % 85 %   Neutro Abs 14.3 (H) 1.7 - 7.7 K/uL   Lymphocytes Relative 7 %    Lymphs Abs 1.1 0.7 - 4.0 K/uL   Monocytes Relative 7 %   Monocytes Absolute 1.1 (H) 0.1 - 1.0 K/uL   Eosinophils Relative 0 %   Eosinophils Absolute 0.0 0.0 - 0.5 K/uL   Basophils Relative 0 %   Basophils Absolute 0.1 0.0 - 0.1 K/uL  Immature Granulocytes 1 %   Abs Immature Granulocytes 0.08 (H) 0.00 - 0.07 K/uL    Comment: Performed at [facility], [address] [place], [place] [address]  Comprehensive metabolic panel     Status: Abnormal   Collection Time: 04/03/20 12:25 PM  Result Value Ref Range   Sodium 145 135 - 145 mmol/L   Potassium 3.9 3.5 - 5.1 mmol/L   Chloride 109 98 - 111 mmol/L   CO2 21 (L) 22 - 32 mmol/L   Glucose, Bld 106 (H) 70 - 99 mg/dL    Comment: Glucose reference range applies only to samples taken after fasting for at least 8 hours.   BUN 24 (H) 6 - 20 mg/dL   Creatinine, Ser 1.57 (H) 0.44 - 1.00 mg/dL   Calcium 9.8 8.9 - 10.3 mg/dL   Total Protein 8.5 (H) 6.5 - 8.1 g/dL   Albumin 4.8 3.5 - 5.0 g/dL   AST 29 15 - 41 U/L   ALT 12 0 - 44 U/L   Alkaline Phosphatase 60 38 - 126 U/L   Total Bilirubin 1.6 (H) 0.3 - 1.2 mg/dL   GFR calc non Af Amer 44 (L) >60 mL/min   GFR calc Af Amer 51 (L) >60 mL/min   Anion gap 15 5 - 15    Comment: Performed at [facility], [address] [place], [place] [address]  Ethanol     Status: None   Collection Time: 04/03/20 12:25 PM  Result Value Ref Range   Alcohol, Ethyl (B) <10 <10 mg/dL    Comment: (NOTE) Lowest detectable limit for serum alcohol is 10 mg/dL. For medical purposes only. Performed at [facility], [address] [place], [place] [address]   CK     Status: Abnormal   Collection Time: 04/03/20 12:25 PM  Result Value Ref Range   Total CK 425 (H) 38 - 234 U/L    Comment: Performed at [facility], [address] [place], [place] [address]  hCG, quantitative, pregnancy     Status: None   Collection Time: 04/03/20 12:25 PM  Result Value Ref Range   hCG,  Beta Chain, Quant, S <1 <5 mIU/mL    Comment:          GEST. AGE      CONC.  (mIU/mL)   <=1 WEEK        5 - 50     2 WEEKS       50 - 500     3 WEEKS       100 - 10,000     4 WEEKS     1,000 - 30,000     5 WEEKS     3,500 - 115,000   6-8 WEEKS     12,000 - 270,000    12 WEEKS     15,000 - 220,000        FEMALE AND NON-PREGNANT FEMALE:     LESS THAN 5 mIU/mL Performed at [facility], [address] [place], [place] [address]     No current facility-administered medications for this encounter.   Current Outpatient Medications  Medication Sig Dispense Refill  . diphenhydrAMINE (BENADRYL) 25 mg capsule Take 1 capsule (25 mg total) by mouth every 4 (four) hours as needed. (Patient not taking: Reported on 02/28/2020) 10 capsule 0  . doxycycline (VIBRA-TABS) 100 MG tablet Take 1 tablet (100 mg total) by mouth 2 (two) times daily. (Patient not taking: Reported on  11/04/2019) 20 tablet 0  . naphazoline-glycerin (CLEAR EYES REDNESS) 0.012-0.2 % SOLN Place 1-2 drops into both eyes 4 (four) times daily as needed for eye irritation. (Patient not taking: Reported on 02/28/2020) 15 mL 0  . Skin Protectants, Misc. (EUCERIN) cream Apply topically as needed for dry skin. 454 g 0    Musculoskeletal: Strength & Muscle Tone: within normal limits Gait & Station: normal Patient leans: N/A  Psychiatric Specialty Exam: Physical Exam  Nursing note and vitals reviewed. Constitutional: She appears well-developed and well-nourished.  Cardiovascular: Normal rate.  Respiratory: Effort normal.  Musculoskeletal:        General: Normal range of motion.     Cervical back: Normal range of motion and neck supple.  Psychiatric: Her behavior is normal.    Review of Systems  Psychiatric/Behavioral: Positive for confusion and hallucinations. The patient is nervous/anxious.   All other systems reviewed and are negative.   Blood pressure 140/84, pulse 96, temperature 98.3 F (36.8 C), temperature source  Axillary, resp. rate 18, SpO2 97 %, unknown if currently breastfeeding.There is no height or weight on file to calculate BMI.  General Appearance: Disheveled  Eye Contact:  Minimal  Speech:  Clear and Coherent  Volume:  Increased  Mood:  Angry and Anxious  Affect:  Congruent  Thought Process:  Coherent  Orientation:  Full (Time, Place, and Person)  Thought Content:  Logical  Suicidal Thoughts:  No  Homicidal Thoughts:  No  Memory:  Immediate;   Good Recent;   Good Remote;   Good  Judgement:  Fair  Insight:  Shallow  Psychomotor Activity:  Normal  Concentration:  Concentration: Fair and Attention Span: Fair  Recall:  Fair  Fund of Knowledge:  Fair  Language:  Good  Akathisia:  Negative  Handed:  Right  AIMS (if indicated):     Assets:  Communication Skills Desire for Improvement Social Support  ADL's:  Intact  Cognition:  WNL  Sleep:    Okay     Treatment Plan Summary: Daily contact with patient to assess and evaluate symptoms and progress in treatment, Medication management and Plan The patient will be observed overnight and reassess in the a.m. to determine if she meets criteria for psychiatric inpatient admission.  Disposition: Supportive therapy provided about ongoing stressors. The patient will remain under observation overnight and reassess in the a.m. to determine if she meets criteria for psychiatric inpatient admission.  Tarsha Blando, NP  04/03/2020 10:00PM 

## 2020-04-04 NOTE — ED Notes (Signed)
Patient assigned to appropriate care area   Introduced self to pt  Patient oriented to unit/care area: Informed that, for their safety, care areas are designed for safety and visiting and phone hours explained to patient. Patient verbalizes understanding, and verbal contract for safety obtained  Environment secured  

## 2020-04-04 NOTE — ED Notes (Signed)
Patient laying in bed, nurse making rounds, patient asked if she could have something to drink Writer asked patient how she was feeling and she stated she was feeling fine, Denied SI/HI/AVH. NAD noted

## 2020-04-04 NOTE — ED Notes (Signed)
Patient given a shasta

## 2020-04-04 NOTE — ED Notes (Signed)
Pt. Up using bathroom. 

## 2020-04-04 NOTE — ED Notes (Signed)
Pt asleep, breakfast tray placed on sink in rm.  

## 2020-04-04 NOTE — Consult Note (Deleted)
[facility] Face-to-Face Psychiatry Consult   Reason for Consult: Altered mental status Referring Physician: Dr. Mayford Knife Patient Identification: Bridget Carpenter MRN:  [id number] Principal Diagnosis: <principal problem not specified> Diagnosis:  Active Problems:   Tobacco use disorder   Borderline personality disorder (HCC)   Cocaine use disorder, severe, dependence (HCC)   Cannabis use disorder, moderate, dependence (HCC)   Alcohol use disorder, mild, abuse   Polysubstance abuse (HCC)   Cocaine abuse with cocaine-induced mood disorder (HCC)   Decreased fetal movement   Homelessness   MDD (major depressive disorder), recurrent episode, severe (HCC)   Adjustment disorder   Total Time spent with patient: 30 minutes   Subjective: " The police beat me up today." Bridget Carpenter is a 29 y.o. female presented to [facility] ED via law enforcement under involuntary commitment status (IVC). The patient was brought in due to her experiencing bizarre behavior screaming for Jehovah.  The patient is agitated and combative.  The patient was seen with generalized abrasion.  She voiced, "the police officers did this to me." The patient was seen face-to-face by this provider; chart reviewed and consulted with Dr. Roxan Hockey on 04/03/2020 due to the patient's care. It was discussed with the EDP that the patient is presenting to be under the influence of cocaine, which the patient denies. She would be observed overnight and reassess in the a.m. to determine if she meets the criteria for psychiatric inpatient admission or could be discharged back to the community. The patient is alert and oriented x3, calm, challenging to arouse but cooperative, and mood-congruent, affecting evaluation. The patient does appear to be responding to internal and external stimuli. She is presenting with delusional thinking. The patient denies auditory hallucinations but verbalized visual hallucinations, such as seeing lions and bears.  The patient denies suicidal, homicidal, or self-harm ideations. The patient is presenting with psychotic behaviors, but she is not exhibiting any paranoid behaviors. During an encounter with the patient, she was able to answer questions appropriately.  Plan: The patient will be observed overnight and reassess in the a.m. to determine if she meets the criteria for psychiatric inpatient admission or can be discharged back to the community. HPI: Per Dr. Mayford Knife; HPI Bridget Carpenter is a 29 y.o. female with a history of asthma, depression, schizoaffective disorder, substance abuse, PTSD who presents to the ED for acute psychosis.  Patient presents with bizarre behavior screaming for Jehovah.  Patient is agitated and combative.  Past Psychiatric History:  Depression Dissociative disorder or reaction H/O: Suicide attempt ODD (oppositional defiant disorder) PTSD (post traumatic stress disorder) Schizoaffective disorder (HCC) Substance abuse (HCC)  Risk to Self: Suicidal Ideation: No Suicidal Intent: No Is patient at risk for suicide?: No Suicidal Plan?: No Access to Means: No What has been your use of drugs/alcohol within the last 12 months?: Cocaine How many times?: 0 Other Self Harm Risks: None reported Triggers for Past Attempts: None known Intentional Self Injurious Behavior: NoneNo Risk to Others: Homicidal Ideation: No Thoughts of Harm to Others: No Current Homicidal Intent: No Current Homicidal Plan: No Access to Homicidal Means: No History of harm to others?: No Assessment of Violence: None Noted Does patient have access to weapons?: No Criminal Charges Pending?: No Does patient have a court date: NoNo Prior Inpatient Therapy: Prior Inpatient Therapy: Yes Prior Therapy Dates: 12/04/2018, 05/03/2018, 12/25/2017, 07/18/2017, 05/07/2017 Prior Therapy Facilty/Provider(s): [facility] Reason for Treatment: Schizoaffective Disorder, Cocaine AbuseYes Prior Outpatient Therapy: Prior  Outpatient Therapy: No Does patient have an  ACCT team?: No Does patient have Intensive In-House Services?  : No Does patient have [facility] services? : No Does patient have P4CC services?: NoYes  Past Medical History:  Past Medical History:  Diagnosis Date  . Asthma   . Depression 2009   Inpatient psych admission for SI, dissociative fugue  . Dissociative disorder or reaction 2009  . Eczema   . H/O: suicide attempt   . ODD (oppositional defiant disorder)   . PTSD (post-traumatic stress disorder)   . Schizoaffective disorder (HCC)   . Substance abuse [facility])     Past Surgical History:  Procedure Laterality Date  . ADENOIDECTOMY    . TONSILLECTOMY     Family History:  Family History  Adopted: Yes  Problem Relation Age of Onset  . Mental illness Mother   . Mental illness Father   . Heart disease Maternal Grandmother   . Sickle cell trait Adoptive Mother   . Cleft palate Cousin        maternal side   Family Psychiatric  History:  Social History:  Social History   Substance and Sexual Activity  Alcohol Use Not Currently  . Alcohol/week: 1.0 standard drinks  . Types: 1 Cans of beer per week     Social History   Substance and Sexual Activity  Drug Use Not Currently  . Types: Marijuana, "Crack" cocaine, Heroin    Social History   Socioeconomic History  . Marital status: Single    Spouse name: Not on file  . Number of children: 1  . Years of education: Not on file  . Highest education level: Not on file  Occupational History  . Not on file  Tobacco Use  . Smoking status: Former Smoker    Packs/day: 0.75    Years: 2.00    Pack years: 1.50    Types: Cigarettes  . Smokeless tobacco: Never Used  . Tobacco comment: Patient states she quit smoking "a long time ago"  Substance and Sexual Activity  . Alcohol use: Not Currently    Alcohol/week: 1.0 standard drinks    Types: 1 Cans of beer per week  . Drug use: Not Currently    Types: Marijuana, "Crack" cocaine,  Heroin  . Sexual activity: Not Currently    Partners: Male  Other Topics Concern  . Not on file  Social History Narrative   Is homeless   Her mother is her daughter's gaurdian   Multiple psychiatric hospitalizations, not on any medication currently   Substance use disorder   Social Determinants of Health   Financial Resource Strain: High Risk  . Difficulty of Paying Living Expenses: Very hard  Food Insecurity: Food Insecurity Present  . Worried About Programme researcher, broadcasting/film/video in the Last Year: Often true  . Ran Out of Food in the Last Year: Often true  Transportation Needs: No Transportation Needs  . Lack of Transportation (Medical): No  . Lack of Transportation (Non-Medical): No  Physical Activity: Sufficiently Active  . Days of Exercise per Week: 4 days  . Minutes of Exercise per Session: 40 min  Stress: Stress Concern Present  . Feeling of Stress : Very much  Social Connections: Severely Isolated  . Frequency of Communication with Friends and Family: Never  . Frequency of Social Gatherings with Friends and Family: Never  . Attends Religious Services: Never  . Active Member of Clubs or Organizations: No  . Attends Banker Meetings: Never  . Marital Status: Never married   Additional Social History:  Allergies:   Allergies  Allergen Reactions  . Banana Hives and Swelling  . Cantaloupe (Diagnostic) Hives  . Grapefruit Extract Itching and Swelling  . Albumin Human Other (See Comments)    Pt refuses all blood products.     Labs:  Results for orders placed or performed during the hospital encounter of 04/03/20 (from the past 48 hour(s))  Urine Drug Screen, Qualitative ([facility] only)     Status: Abnormal   Collection Time: 04/03/20 11:37 AM  Result Value Ref Range   Tricyclic, Ur Screen NONE DETECTED NONE DETECTED   Amphetamines, Ur Screen NONE DETECTED NONE DETECTED   MDMA (Ecstasy)Ur Screen NONE DETECTED NONE DETECTED   Cocaine Metabolite,Ur [place] POSITIVE (A)  NONE DETECTED   Opiate, Ur Screen NONE DETECTED NONE DETECTED   Phencyclidine (PCP) Ur S NONE DETECTED NONE DETECTED   Cannabinoid 50 Ng, Ur [place] NONE DETECTED NONE DETECTED   Barbiturates, Ur Screen NONE DETECTED NONE DETECTED   Benzodiazepine, Ur Scrn NONE DETECTED NONE DETECTED   Methadone Scn, Ur NONE DETECTED NONE DETECTED    Comment: (NOTE) Tricyclics + metabolites, urine    Cutoff 1000 ng/mL Amphetamines + metabolites, urine  Cutoff 1000 ng/mL MDMA (Ecstasy), urine              Cutoff 500 ng/mL Cocaine Metabolite, urine          Cutoff 300 ng/mL Opiate + metabolites, urine        Cutoff 300 ng/mL Phencyclidine (PCP), urine         Cutoff 25 ng/mL Cannabinoid, urine                 Cutoff 50 ng/mL Barbiturates + metabolites, urine  Cutoff 200 ng/mL Benzodiazepine, urine              Cutoff 200 ng/mL Methadone, urine                   Cutoff 300 ng/mL The urine drug screen provides only a preliminary, unconfirmed analytical test result and should not be used for non-medical purposes. Clinical consideration and professional judgment should be applied to any positive drug screen result due to possible interfering substances. A more specific alternate chemical method must be used in order to obtain a confirmed analytical result. Gas chromatography / mass spectrometry (GC/MS) is the preferred confirmat ory method. Performed at [facility], [address] [place], [place] [address]   CBC with Differential/Platelet     Status: Abnormal   Collection Time: 04/03/20 12:25 PM  Result Value Ref Range   WBC 16.6 (H) 4.0 - 10.5 K/uL   RBC 4.09 3.87 - 5.11 MIL/uL   Hemoglobin 12.8 12.0 - 15.0 g/dL   HCT 39.7 67.3 - 41.9 %   MCV 95.4 80.0 - 100.0 fL   MCH 31.3 26.0 - 34.0 pg   MCHC 32.8 30.0 - 36.0 g/dL   RDW 37.9 02.4 - 09.7 %   Platelets 230 150 - 400 K/uL   nRBC 0.0 0.0 - 0.2 %   Neutrophils Relative % 85 %   Neutro Abs 14.3 (H) 1.7 - 7.7 K/uL   Lymphocytes Relative 7 %    Lymphs Abs 1.1 0.7 - 4.0 K/uL   Monocytes Relative 7 %   Monocytes Absolute 1.1 (H) 0.1 - 1.0 K/uL   Eosinophils Relative 0 %   Eosinophils Absolute 0.0 0.0 - 0.5 K/uL   Basophils Relative 0 %   Basophils Absolute 0.1 0.0 - 0.1 K/uL  Immature Granulocytes 1 %   Abs Immature Granulocytes 0.08 (H) 0.00 - 0.07 K/uL    Comment: Performed at [facility], [address] [place], [place] [address]  Comprehensive metabolic panel     Status: Abnormal   Collection Time: 04/03/20 12:25 PM  Result Value Ref Range   Sodium 145 135 - 145 mmol/L   Potassium 3.9 3.5 - 5.1 mmol/L   Chloride 109 98 - 111 mmol/L   CO2 21 (L) 22 - 32 mmol/L   Glucose, Bld 106 (H) 70 - 99 mg/dL    Comment: Glucose reference range applies only to samples taken after fasting for at least 8 hours.   BUN 24 (H) 6 - 20 mg/dL   Creatinine, Ser 1.57 (H) 0.44 - 1.00 mg/dL   Calcium 9.8 8.9 - 10.3 mg/dL   Total Protein 8.5 (H) 6.5 - 8.1 g/dL   Albumin 4.8 3.5 - 5.0 g/dL   AST 29 15 - 41 U/L   ALT 12 0 - 44 U/L   Alkaline Phosphatase 60 38 - 126 U/L   Total Bilirubin 1.6 (H) 0.3 - 1.2 mg/dL   GFR calc non Af Amer 44 (L) >60 mL/min   GFR calc Af Amer 51 (L) >60 mL/min   Anion gap 15 5 - 15    Comment: Performed at [facility], [address] [place], [place] [address]  Ethanol     Status: None   Collection Time: 04/03/20 12:25 PM  Result Value Ref Range   Alcohol, Ethyl (B) <10 <10 mg/dL    Comment: (NOTE) Lowest detectable limit for serum alcohol is 10 mg/dL. For medical purposes only. Performed at [facility], [address] [place], [place] [address]   CK     Status: Abnormal   Collection Time: 04/03/20 12:25 PM  Result Value Ref Range   Total CK 425 (H) 38 - 234 U/L    Comment: Performed at [facility], [address] [place], [place] [address]  hCG, quantitative, pregnancy     Status: None   Collection Time: 04/03/20 12:25 PM  Result Value Ref Range   hCG,  Beta Chain, Quant, S <1 <5 mIU/mL    Comment:          GEST. AGE      CONC.  (mIU/mL)   <=1 WEEK        5 - 50     2 WEEKS       50 - 500     3 WEEKS       100 - 10,000     4 WEEKS     1,000 - 30,000     5 WEEKS     3,500 - 115,000   6-8 WEEKS     12,000 - 270,000    12 WEEKS     15,000 - 220,000        FEMALE AND NON-PREGNANT FEMALE:     LESS THAN 5 mIU/mL Performed at [facility], [address] [place], [place] [address]     No current facility-administered medications for this encounter.   Current Outpatient Medications  Medication Sig Dispense Refill  . diphenhydrAMINE (BENADRYL) 25 mg capsule Take 1 capsule (25 mg total) by mouth every 4 (four) hours as needed. (Patient not taking: Reported on 02/28/2020) 10 capsule 0  . doxycycline (VIBRA-TABS) 100 MG tablet Take 1 tablet (100 mg total) by mouth 2 (two) times daily. (Patient not taking: Reported on  11/04/2019) 20 tablet 0  . naphazoline-glycerin (CLEAR EYES REDNESS) 0.012-0.2 % SOLN Place 1-2 drops into both eyes 4 (four) times daily as needed for eye irritation. (Patient not taking: Reported on 02/28/2020) 15 mL 0  . Skin Protectants, Misc. (EUCERIN) cream Apply topically as needed for dry skin. 454 g 0    Musculoskeletal: Strength & Muscle Tone: within normal limits Gait & Station: normal Patient leans: N/A  Psychiatric Specialty Exam: Physical Exam  Nursing note and vitals reviewed. Constitutional: She appears well-developed and well-nourished.  Cardiovascular: Normal rate.  Respiratory: Effort normal.  Musculoskeletal:        General: Normal range of motion.     Cervical back: Normal range of motion and neck supple.  Psychiatric: Her behavior is normal.    Review of Systems  Psychiatric/Behavioral: Positive for confusion and hallucinations. The patient is nervous/anxious.   All other systems reviewed and are negative.   Blood pressure 140/84, pulse 96, temperature 98.3 F (36.8 C), temperature source  Axillary, resp. rate 18, SpO2 97 %, unknown if currently breastfeeding.There is no height or weight on file to calculate BMI.  General Appearance: Disheveled  Eye Contact:  Minimal  Speech:  Clear and Coherent  Volume:  Increased  Mood:  Angry and Anxious  Affect:  Congruent  Thought Process:  Coherent  Orientation:  Full (Time, Place, and Person)  Thought Content:  Logical  Suicidal Thoughts:  No  Homicidal Thoughts:  No  Memory:  Immediate;   Good Recent;   Good Remote;   Good  Judgement:  Fair  Insight:  Shallow  Psychomotor Activity:  Normal  Concentration:  Concentration: Fair and Attention Span: Fair  Recall:  Fair  Fund of Knowledge:  Fair  Language:  Good  Akathisia:  Negative  Handed:  Right  AIMS (if indicated):     Assets:  Communication Skills Desire for Improvement Social Support  ADL's:  Intact  Cognition:  WNL  Sleep:    Okay     Treatment Plan Summary: Daily contact with patient to assess and evaluate symptoms and progress in treatment, Medication management and Plan The patient will be observed overnight and reassess in the a.m. to determine if she meets criteria for psychiatric inpatient admission.  Disposition: Supportive therapy provided about ongoing stressors. The patient will remain under observation overnight and reassess in the a.m. to determine if she meets criteria for psychiatric inpatient admission.  [name], NP  04/03/2020 10:00PM 

## 2020-04-04 NOTE — ED Provider Notes (Signed)
Emergency Medicine Observation Re-evaluation Note  [name], seen on rounds today.  Pt initially presented to the ED for complaints of Psychiatric Evaluation Currently, the patient is resting in NAD.  Physical Exam  BP 140/84   Pulse 96   Temp 98.3 F (36.8 C) (Axillary)   Resp 18   LMP  (LMP Unknown)   SpO2 97%  Physical Exam  ED Course / MDM  EKG:  Clinical Course as of Apr 04 613  Tue Apr 03, 2020  1316 Mildly elevated CK level and AKI, she is going to receive IV fluids   [JW]    Clinical Course User Index [JW] Emily Filbert, MD   I have reviewed the labs performed to date as well as medications administered while in observation.  No events overnight.  Plan  Current plan is for psychiatric disposition. Patient is under full IVC at this time.   Irean Hong, MD 04/04/20 [phone]
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
## 2020-04-05 MED ORDER — BACITRACIN ZINC 500 UNIT/GM EX OINT
1.0000 "application " | TOPICAL_OINTMENT | Freq: Once | CUTANEOUS | Status: AC
  Administered 2020-04-05: 1 via TOPICAL
  Filled 2020-04-05 (×2): qty 0.9

## 2020-04-05 NOTE — ED Notes (Signed)
Pt. Up using bathroom, pt. Returned to room with steady gait. 

## 2020-04-05 NOTE — ED Notes (Signed)
Pt given clean scrubs and supplies to take a shower.  

## 2020-04-05 NOTE — ED Notes (Signed)
Medication applied to the wounds on patient's bilateral wrists. Bandages also applied.

## 2020-04-05 NOTE — BH Assessment (Signed)
Writer spoke with the patient to complete an updated/reassessment. Patient denies SI/HI and AV/H. 

## 2020-04-05 NOTE — ED Notes (Signed)
Pt asking if she can be discharged today. "I'm scared here."

## 2020-04-05 NOTE — ED Notes (Signed)
Pt discharged home with bus pass.  Pt refused VS. "I have to get out of here."   Pt denies SI/HI. All belongings returned to patient. Discharge instructions reviewed with patient.

## 2020-04-05 NOTE — ED Provider Notes (Signed)
Emergency Medicine Observation Re-evaluation Note  [name], seen on rounds today.  Pt initially presented to the ED for complaints of Psychiatric Evaluation Currently, the patient is calm, without acute symptoms...  Physical Exam  BP 97/76 (BP Location: Right Arm)   Pulse 80   Temp 98.1 F (36.7 C) (Oral)   Resp 18   LMP  (LMP Unknown)   SpO2 100%  Physical Exam  ED Course / MDM  EKG:  Clinical Course as of Apr 05 1229  Tue Apr 03, 2020  1316 Mildly elevated CK level and AKI, she is going to receive IV fluids   [JW]    Clinical Course User Index [JW] Emily Filbert, MD   I have reviewed the labs performed to date as well as medications administered while in observation.  Recent changes in the last 24 hours include patient now awake, alert, oriented.  Ambulates with steady gait, voiding, tolerating oral intake.  Clear speech.  Clinically sober.. Plan  Current plan is for discharge.  Patient has been reassessed by psychiatry, I have discussed with psychiatrist after their evaluation, finds this encounter to be due to cocaine intoxication.  Patient is now sober and lucid.  Vital signs are normal, labs did show some dehydration and mildly elevated CK and patient is tolerating oral fluids and was given IV fluids.,. Patient is not under full IVC at this time.  IVC was rescinded by psychiatry   Sharman Cheek, MD 04/05/20 1232

## 2020-04-12 ENCOUNTER — Other Ambulatory Visit: Payer: Self-pay

## 2020-04-12 ENCOUNTER — Encounter: Payer: Self-pay | Admitting: Emergency Medicine

## 2020-04-12 ENCOUNTER — Emergency Department
Admission: EM | Admit: 2020-04-12 | Discharge: 2020-04-12 | Disposition: A | Discharge status: Home / Self Care | Payer: Medicaid Other | Attending: Emergency Medicine | Admitting: Emergency Medicine

## 2020-04-12 DIAGNOSIS — Z87891 Personal history of nicotine dependence: Secondary | ICD-10-CM | POA: Insufficient documentation

## 2020-04-12 DIAGNOSIS — J45909 Unspecified asthma, uncomplicated: Secondary | ICD-10-CM | POA: Diagnosis not present

## 2020-04-12 DIAGNOSIS — F603 Borderline personality disorder: Secondary | ICD-10-CM | POA: Diagnosis not present

## 2020-04-12 DIAGNOSIS — Z20822 Contact with and (suspected) exposure to covid-19: Secondary | ICD-10-CM | POA: Insufficient documentation

## 2020-04-12 DIAGNOSIS — S60522A Blister (nonthermal) of left hand, initial encounter: Secondary | ICD-10-CM

## 2020-04-12 DIAGNOSIS — R21 Rash and other nonspecific skin eruption: Secondary | ICD-10-CM | POA: Insufficient documentation

## 2020-04-12 DIAGNOSIS — L309 Dermatitis, unspecified: Secondary | ICD-10-CM

## 2020-04-12 DIAGNOSIS — F259 Schizoaffective disorder, unspecified: Secondary | ICD-10-CM | POA: Diagnosis present

## 2020-04-12 LAB — SARS CORONAVIRUS 2 (TAT 6-24 HRS): SARS Coronavirus 2: NEGATIVE

## 2020-04-12 MED ORDER — BACITRACIN ZINC 500 UNIT/GM EX OINT
TOPICAL_OINTMENT | Freq: Once | CUTANEOUS | Status: AC
  Administered 2020-04-12: 2 via TOPICAL
  Filled 2020-04-12: qty 2.7
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
## 2020-04-12 NOTE — ED Notes (Signed)
Changed pt out:  Black socks Black cell phone with Consulting civil engineer Black sweatshirt Black t-shirt Navy jogging pants White canvas bag with papers Two earrings Nose clamp Two rings White boxers [employer] fuzzy sandels

## 2020-04-12 NOTE — ED Notes (Signed)
Pt moved to 20 hall  Report called to [facility]

## 2020-04-12 NOTE — ED Triage Notes (Signed)
Pt states she is here because she has a skin infection. Hx of eczema. Skin on her arms, wrists, and hands is cracking and very dry. No redness or drainage noted at this time. Pt states she has every disease even the new ones that havent come out yet.
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
## 2020-04-12 NOTE — ED Notes (Signed)
Clean pt's wounds, applied antibiotic ointment, and wrapped area with gauze.
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
## 2020-04-12 NOTE — ED Notes (Signed)
Gave pt peanut butter and crackers.  

## 2020-04-12 NOTE — ED Notes (Signed)
Sent red,green,and purple top tubes to lab. 

## 2020-04-12 NOTE — BH Assessment (Signed)
Patient unable to complete the TTS consult. Patient discharge prior to writer seeing her.

## 2020-04-12 NOTE — ED Notes (Signed)
See triage note  Presents itchy all over  Hx of eczema  Skin is cracked at her wrist area

## 2020-04-12 NOTE — ED Notes (Addendum)
Pt has been given a food tray with juice and sprite. After eating pt was very upset,because another pt was yelling. Pt got up and tried to walk down the hall. We walked her back to her bed ,and has been more calm since talking with her.

## 2020-04-12 NOTE — ED Notes (Signed)
Patient evaluated by psych, patient does not meet admission criteria. Will monitor feed and disposition when appropriate.

## 2020-04-15 ENCOUNTER — Emergency Department
Admission: EM | Admit: 2020-04-15 | Discharge: 2020-04-16 | Disposition: A | Discharge status: Home / Self Care | Payer: Medicaid Other | Attending: Emergency Medicine | Admitting: Emergency Medicine

## 2020-04-15 ENCOUNTER — Other Ambulatory Visit: Payer: Self-pay

## 2020-04-15 DIAGNOSIS — Z87891 Personal history of nicotine dependence: Secondary | ICD-10-CM | POA: Diagnosis not present

## 2020-04-15 DIAGNOSIS — F1420 Cocaine dependence, uncomplicated: Secondary | ICD-10-CM | POA: Diagnosis not present

## 2020-04-15 DIAGNOSIS — J45909 Unspecified asthma, uncomplicated: Secondary | ICD-10-CM | POA: Insufficient documentation

## 2020-04-15 DIAGNOSIS — Z20822 Contact with and (suspected) exposure to covid-19: Secondary | ICD-10-CM | POA: Insufficient documentation

## 2020-04-15 DIAGNOSIS — R45851 Suicidal ideations: Secondary | ICD-10-CM | POA: Diagnosis not present

## 2020-04-15 DIAGNOSIS — F603 Borderline personality disorder: Secondary | ICD-10-CM | POA: Diagnosis present

## 2020-04-15 DIAGNOSIS — F259 Schizoaffective disorder, unspecified: Secondary | ICD-10-CM | POA: Diagnosis not present

## 2020-04-15 DIAGNOSIS — Z046 Encounter for general psychiatric examination, requested by authority: Secondary | ICD-10-CM | POA: Diagnosis present

## 2020-04-15 LAB — CBC
HCT: 39.4 % (ref 36.0–46.0)
Hemoglobin: 13.1 g/dL (ref 12.0–15.0)
MCH: 31.3 pg (ref 26.0–34.0)
MCHC: 33.2 g/dL (ref 30.0–36.0)
MCV: 94.3 fL (ref 80.0–100.0)
Platelets: 297 10*3/uL (ref 150–400)
RBC: 4.18 MIL/uL (ref 3.87–5.11)
RDW: 13.4 % (ref 11.5–15.5)
WBC: 7 10*3/uL (ref 4.0–10.5)
nRBC: 0 % (ref 0.0–0.2)

## 2020-04-15 LAB — COMPREHENSIVE METABOLIC PANEL
ALT: 15 U/L (ref 0–44)
AST: 22 U/L (ref 15–41)
Albumin: 4.1 g/dL (ref 3.5–5.0)
Alkaline Phosphatase: 43 U/L (ref 38–126)
Anion gap: 8 (ref 5–15)
BUN: 16 mg/dL (ref 6–20)
CO2: 26 mmol/L (ref 22–32)
Calcium: 9.2 mg/dL (ref 8.9–10.3)
Chloride: 106 mmol/L (ref 98–111)
Creatinine, Ser: 0.66 mg/dL (ref 0.44–1.00)
GFR calc Af Amer: >60 mL/min (ref >60)
GFR calc non Af Amer: >60 mL/min (ref >60)
Glucose, Bld: 100 mg/dL — ABNORMAL HIGH (ref 70–99)
Potassium: 3.5 mmol/L (ref 3.5–5.1)
Sodium: 140 mmol/L (ref 135–145)
Total Bilirubin: 0.9 mg/dL (ref 0.3–1.2)
Total Protein: 7.7 g/dL (ref 6.5–8.1)

## 2020-04-15 LAB — RESPIRATORY PANEL BY RT PCR (FLU A&B, COVID)
Influenza A by PCR: NEGATIVE
Influenza B by PCR: NEGATIVE
SARS Coronavirus 2 by RT PCR: NEGATIVE

## 2020-04-15 LAB — ACETAMINOPHEN LEVEL: Acetaminophen (Tylenol), Serum: <10 ug/mL — ABNORMAL LOW (ref 10–30)

## 2020-04-15 LAB — SALICYLATE LEVEL: Salicylate Lvl: <7 mg/dL — ABNORMAL LOW (ref 7.0–30.0)

## 2020-04-15 LAB — ETHANOL: Alcohol, Ethyl (B): <10 mg/dL (ref <10)

## 2020-04-15 NOTE — ED Notes (Signed)
Pt. Was given a sprite.  

## 2020-04-15 NOTE — ED Notes (Signed)
Pt woke up to see meal tray there. Pt goes back to sleep.

## 2020-04-15 NOTE — ED Notes (Signed)
Hourly rounding reveals patient asleep in hall bed. No complaints, stable, in no acute distress. Q15 minute rounds and monitoring via Rover and Officer to continue.  

## 2020-04-15 NOTE — ED Notes (Signed)
Hourly rounding reveals patient sleeping in room. No complaints, stable, in no acute distress. Q15 minute rounds and monitoring via Security Cameras to continue. 

## 2020-04-15 NOTE — ED Notes (Signed)
Pt given dinner tray.

## 2020-04-15 NOTE — Consult Note (Signed)
[facility] Face-to-Face Psychiatry Consult   Reason for Consult: Altered mental status Referring Physician: Dr. Mayford Knife Patient Identification: Bridget Carpenter Shayan Bouie MRN:  [id number] Principal Diagnosis: <principal problem not specified> Diagnosis:  Active Problems:   * No active hospital problems. *   Total Time spent with patient: 30 minutes   Subjective: Unable to assess patient difficult to arouse.   Per TTS assessment note: Bridget Carpenter is an 29 y.o. female presenting to [facility] ED voluntarily for SI. Per triage note Patient here voluntarily. Reports having suicidal thoughts. During assessment patient was laying in her bed and appeared restless, patient could not lay still in bed and reported that she was cold and that was why she was restless. Patient was alert and oriented x4, appeared sad and depressed, affect was flat. Patient was just discharged from this ED on 04/05/20 for Cocaine abuse and returned on 04/12/20 for a skin infection. Patient is well known to the ED due to having a history of Cocaine abuse and Schizoaffective Disorder. Patient reported why she was presenting to ED "I'm having suicidal thoughts, I just don't want to be alive." Patient denies having an plan currently. When asked how long patient has been feeling suicidal she reports "since I was born." Patient reported she is currently homeless and has no support. Patient reported that she used Cocaine today "not much" and reported using via inhaling. Patient reported being clean in the past "for 7 years." Patient is not currently engaged in any outpatient treatment to assist with her substance abuse or her mental health. Patient reported having current AH and when asked what voices she hears she reported "they just talking to me, I've been hearing them since I was born." Patient reported lack of sleep but appetite is fair. No UDS obtained as of yet. Patient reports SI/AH denies HI/VH.  Plan: The patient will be observe  overnight and reassess in the a.m. to determine if she meets the criteria for psychiatric inpatient admission or can be discharged back to the community.   HPI: Per Dr. Mayford Knife; HPI Bridget Carpenter is a 29 y.o. female with a history of asthma, depression, schizoaffective disorder, substance abuse, PTSD who presents to the ED for acute psychosis.  Patient presents with bizarre behavior screaming for Jehovah.  Patient is agitated and combative.  Past Psychiatric History:  Depression Dissociative disorder or reaction H/O: Suicide attempt ODD (oppositional defiant disorder) PTSD (post traumatic stress disorder) Schizoaffective disorder (HCC) Substance abuse (HCC)  Patient 6 months post partum with neonate demise.  Previous medications: ABilify, Trazodone, Depakote  Risk to Self: Suicidal Ideation: Yes-Currently Present Suicidal Intent: Yes-Currently Present Is patient at risk for suicide?: Yes Suicidal Plan?: No Access to Means: No What has been your use of drugs/alcohol within the last 12 months?: Cocaine How many times?: 0 Triggers for Past Attempts: None known Intentional Self Injurious Behavior: NoneNo Risk to Others: Homicidal Ideation: No Thoughts of Harm to Others: No Current Homicidal Intent: No Current Homicidal Plan: No Access to Homicidal Means: No History of harm to others?: No Assessment of Violence: None Noted Does patient have access to weapons?: No Criminal Charges Pending?: Yes Describe Pending Criminal Charges: Unable to report her current charges Does patient have a court date: (Unknown)No Prior Inpatient Therapy: Prior Inpatient Therapy: Yes Prior Therapy Dates: 12/04/2018, 05/03/2018, 12/25/2017, 07/18/2017, 05/07/2017 Prior Therapy Facilty/Provider(s): [facility] Reason for Treatment: Schizoaffective Disorder, Cocaine AbuseYes Prior Outpatient Therapy: Prior Outpatient Therapy: No Does patient have an ACCT team?: No Does patient  have Intensive In-House  Services?  : No Does patient have [facility] services? : No Does patient have P4CC services?: NoYes  Past Medical History:  Past Medical History:  Diagnosis Date  . Asthma   . Depression 2009   Inpatient psych admission for SI, dissociative fugue  . Dissociative disorder or reaction 2009  . Eczema   . H/O: suicide attempt   . ODD (oppositional defiant disorder)   . PTSD (post-traumatic stress disorder)   . Schizoaffective disorder (HCC)   . Substance abuse [facility])     Past Surgical History:  Procedure Laterality Date  . ADENOIDECTOMY    . TONSILLECTOMY     Family History:  Family History  Adopted: Yes  Problem Relation Age of Onset  . Mental illness Mother   . Mental illness Father   . Heart disease Maternal Grandmother   . Sickle cell trait Adoptive Mother   . Cleft palate Cousin        maternal side   Family Psychiatric  History:  Social History:  Social History   Substance and Sexual Activity  Alcohol Use Not Currently  . Alcohol/week: 1.0 standard drinks  . Types: 1 Cans of beer per week     Social History   Substance and Sexual Activity  Drug Use Not Currently  . Types: Marijuana, "Crack" cocaine, Heroin    Social History   Socioeconomic History  . Marital status: Single    Spouse name: Not on file  . Number of children: 1  . Years of education: Not on file  . Highest education level: Not on file  Occupational History  . Not on file  Tobacco Use  . Smoking status: Former Smoker    Packs/day: 0.75    Years: 2.00    Pack years: 1.50    Types: Cigarettes  . Smokeless tobacco: Never Used  . Tobacco comment: pt states she is quitting today  Substance and Sexual Activity  . Alcohol use: Not Currently    Alcohol/week: 1.0 standard drinks    Types: 1 Cans of beer per week  . Drug use: Not Currently    Types: Marijuana, "Crack" cocaine, Heroin  . Sexual activity: Not Currently    Partners: Male  Other Topics Concern  . Not on file  Social History  Narrative   Is homeless   Her mother is her daughter's gaurdian   Multiple psychiatric hospitalizations, not on any medication currently   Substance use disorder   Social Determinants of Health   Financial Resource Strain: High Risk  . Difficulty of Paying Living Expenses: Very hard  Food Insecurity: Food Insecurity Present  . Worried About Programme researcher, broadcasting/film/video in the Last Year: Often true  . Ran Out of Food in the Last Year: Often true  Transportation Needs: No Transportation Needs  . Lack of Transportation (Medical): No  . Lack of Transportation (Non-Medical): No  Physical Activity: Sufficiently Active  . Days of Exercise per Week: 4 days  . Minutes of Exercise per Session: 40 min  Stress: Stress Concern Present  . Feeling of Stress : Very much  Social Connections: Severely Isolated  . Frequency of Communication with Friends and Family: Never  . Frequency of Social Gatherings with Friends and Family: Never  . Attends Religious Services: Never  . Active Member of Clubs or Organizations: No  . Attends Banker Meetings: Never  . Marital Status: Never married   Additional Social History:    Allergies:   Allergies  Allergen Reactions  . Banana Hives and Swelling  . Cantaloupe (Diagnostic) Hives  . Grapefruit Extract Itching and Swelling  . Albumin Human Other (See Comments)    Pt refuses all blood products.     Labs:  Results for orders placed or performed during the hospital encounter of 04/15/20 (from the past 48 hour(s))  Comprehensive metabolic panel     Status: Abnormal   Collection Time: 04/15/20  4:47 AM  Result Value Ref Range   Sodium 140 135 - 145 mmol/L   Potassium 3.5 3.5 - 5.1 mmol/L   Chloride 106 98 - 111 mmol/L   CO2 26 22 - 32 mmol/L   Glucose, Bld 100 (H) 70 - 99 mg/dL    Comment: Glucose reference range applies only to samples taken after fasting for at least 8 hours.   BUN 16 6 - 20 mg/dL   Creatinine, Ser 1.32 0.44 - 1.00 mg/dL    Calcium 9.2 8.9 - 44.0 mg/dL   Total Protein 7.7 6.5 - 8.1 g/dL   Albumin 4.1 3.5 - 5.0 g/dL   AST 22 15 - 41 U/L   ALT 15 0 - 44 U/L   Alkaline Phosphatase 43 38 - 126 U/L   Total Bilirubin 0.9 0.3 - 1.2 mg/dL   GFR calc non Af Amer >60 >60 mL/min   GFR calc Af Amer >60 >60 mL/min   Anion gap 8 5 - 15    Comment: Performed at [facility], [address] [place], [place] [address]  Ethanol     Status: None   Collection Time: 04/15/20  4:47 AM  Result Value Ref Range   Alcohol, Ethyl (B) <10 <10 mg/dL    Comment: (NOTE) Lowest detectable limit for serum alcohol is 10 mg/dL. For medical purposes only. Performed at [facility], [address] [place], [place] [address]   Salicylate level     Status: Abnormal   Collection Time: 04/15/20  4:47 AM  Result Value Ref Range   Salicylate Lvl <7.0 (L) 7.0 - 30.0 mg/dL    Comment: Performed at [facility], [address] [place], [place] [address]  Acetaminophen level     Status: Abnormal   Collection Time: 04/15/20  4:47 AM  Result Value Ref Range   Acetaminophen (Tylenol), Serum <10 (L) 10 - 30 ug/mL    Comment: (NOTE) Therapeutic concentrations vary significantly. A range of 10-30 ug/mL  may be an effective concentration for many patients. However, some  are best treated at concentrations outside of this range. Acetaminophen concentrations >150 ug/mL at 4 hours after ingestion  and >50 ug/mL at 12 hours after ingestion are often associated with  toxic reactions. Performed at [facility], [address] [place], [place] [address]   cbc     Status: None   Collection Time: 04/15/20  4:47 AM  Result Value Ref Range   WBC 7.0 4.0 - 10.5 K/uL   RBC 4.18 3.87 - 5.11 MIL/uL   Hemoglobin 13.1 12.0 - 15.0 g/dL   HCT 63.8 75.6 - 43.3 %   MCV 94.3 80.0 - 100.0 fL   MCH 31.3 26.0 - 34.0 pg   MCHC 33.2 30.0 - 36.0 g/dL   RDW 29.5 18.8 - 41.6 %   Platelets 297 150 - 400 K/uL   nRBC 0.0 0.0 -  0.2 %    Comment: Performed at [facility], [address], [place], [place] [address]    No current facility-administered  medications for this encounter.   Current Outpatient Medications  Medication Sig Dispense Refill  . Skin Protectants, Misc. (EUCERIN) cream Apply topically as needed for dry skin. 454 g 0    Musculoskeletal: Strength & Muscle Tone: within normal limits Gait & Station: normal Patient leans: N/A  Psychiatric Specialty Exam: Physical Exam  Nursing note and vitals reviewed. Constitutional: She appears well-developed and well-nourished.  Cardiovascular: Normal rate.  Respiratory: Effort normal.  Musculoskeletal:        General: Normal range of motion.     Cervical back: Normal range of motion and neck supple.  Psychiatric: Her behavior is normal.    Review of Systems  Psychiatric/Behavioral: Positive for confusion and hallucinations. The patient is nervous/anxious.   All other systems reviewed and are negative.   Blood pressure 113/84, pulse 61, temperature 98 F (36.7 C), temperature source Oral, resp. rate 16, height 5\' 3"  (1.6 m), weight 59 kg, SpO2 97 %, unknown if currently breastfeeding.Body mass index is 23.03 kg/m.  General Appearance: Disheveled  Eye Contact:  Poor  Speech:  Slow  Volume:  UTA  Mood:  UTA  Affect:  Congruent and UTA  Thought Process:  Coherent, Linear and Descriptions of Associations: Intact  Orientation:  Full (Time, Place, and Person)  Thought Content:  UTA  Suicidal Thoughts:  Yes.  without intent/plan  Homicidal Thoughts:  No  Memory:  Negative  Judgement:  NA  Insight:  NA  Psychomotor Activity:  Normal  Concentration:  Concentration: NA and Attention Span: NA  Recall:  NA  Fund of Knowledge:  NA  Language:  NA  Akathisia:  NA  Handed:  Right  AIMS (if indicated):     Assets:  Others:  UTA  ADL's:  Intact  Cognition:  WNL  Sleep:    Okay     Treatment Plan Summary: Daily contact with patient to  assess and evaluate symptoms and progress in treatment, Medication management and Plan The patient will be observed overnight and reassess in the a.m. to determine if she meets criteria for psychiatric inpatient admission.  Disposition: Supportive therapy provided about ongoing stressors. The patient will remain under observation overnight and reassess in the a.m. to determine if she meets criteria for psychiatric inpatient admission.  Suella Broad, FNP  04/03/2020 10:00PM

## 2020-04-15 NOTE — ED Notes (Signed)
Pt. Was giving a dinner tray with drink.

## 2020-04-15 NOTE — ED Notes (Signed)
Hourly rounding reveals patient sleeping in hall bed. No complaints, stable, in no acute distress. Q15 minute rounds and monitoring via Rover and Officer to continue.  

## 2020-04-15 NOTE — ED Notes (Signed)
Vol patient moved to [facility]

## 2020-04-15 NOTE — ED Notes (Signed)
Pt. Awake and went to the restroom and asked for a drink of sprite.

## 2020-04-15 NOTE — ED Notes (Signed)
Report to include Situation, Background, Assessment, and Recommendations received from [facility]. Patient alert and oriented, warm and dry, in no acute distress. Patient denies HI, VH and pain but states that she has AH without command and SI without plan. Patient made aware of Q15 minute rounds and security cameras for their safety. Patient instructed to come to me with needs or concerns.

## 2020-04-15 NOTE — ED Notes (Signed)
Pt. Transferred from Triage to room after dressing out and screening for contraband. Pt. Oriented to Quad including Q15 minute rounds as well as Psychologist, counselling for their protection. Patient is alert and oriented, warm and dry in no acute distress. Patient denies HI, and AVH but states she has SI without plan. Pt. Encouraged to let me know if needs arise.

## 2020-04-15 NOTE — ED Provider Notes (Signed)
[facility] Emergency Department Provider Note   ____________________________________________   First MD Initiated Contact with Patient 04/15/20 [phone]     (approximate)  I have reviewed the triage vital signs and the nursing notes.   HISTORY  Chief Complaint Psychiatric Evaluation    HPI Bridget Carpenter is a 29 y.o. female who presents voluntarily to the ED with police for suicidal thoughts without plan.  Endorses cocaine use.  Patient is well-known to the ED with a history of schizoaffective disorder, substance abuse, ODD and PTSD.  Denies active HI/AH/VH.  Voices no medical complaints.       Past Medical History:  Diagnosis Date  . Asthma   . Depression 2009   Inpatient psych admission for SI, dissociative fugue  . Dissociative disorder or reaction 2009  . Eczema   . H/O: suicide attempt   . ODD (oppositional defiant disorder)   . PTSD (post-traumatic stress disorder)   . Schizoaffective disorder ([place])   . Substance abuse [facility])     Patient Active Problem List   Diagnosis Date Noted  . Adjustment disorder   . MDD (major depressive disorder), recurrent episode, severe ([place]) 08/31/2019  . Indication for care in labor and delivery, antepartum 08/29/2019  . Hydrops fetalis 08/29/2019  . Indication for care in labor or delivery 08/14/2019  . Dehydration 08/14/2019  . Rubella non-immune status, antepartum 08/01/2019  . Pain of round ligament affecting pregnancy, antepartum 08/01/2019  . Labor and delivery indication for care or intervention 07/30/2019  . Homelessness 07/25/2019  . Decreased fetal movement 07/02/2019  . Club foot, fetal, affecting care of mother, antepartum 06/29/2019  . Supervision of high risk pregnancy, antepartum 06/19/2019  . Cocaine abuse with cocaine-induced mood disorder ([place]) 04/15/2019  . Psychoactive substance-induced psychosis ([place]) 05/03/2018  . Bacterial vaginosis 12/25/2017  . PTSD (post-traumatic stress  disorder) 09/02/2017  . MRSA carrier 07/18/2017  . Overdose 07/16/2017  . Aspiration pneumonia ([place]) 07/16/2017  . Acute respiratory failure ([place]) 07/16/2017  . Polysubstance abuse ([place]) 07/16/2017  . Eczema 05/08/2017  . Borderline personality disorder ([place]) 11/06/2016  . Cocaine use disorder, severe, dependence ([place]) 11/06/2016  . Cannabis use disorder, moderate, dependence ([place]) 11/06/2016  . Alcohol use disorder, mild, abuse 11/06/2016  . Asthma 09/23/2011  . Tobacco use disorder 09/15/2009    Past Surgical History:  Procedure Laterality Date  . ADENOIDECTOMY    . TONSILLECTOMY      Prior to Admission medications   Medication Sig Start Date End Date Taking? Authorizing Provider  Skin Protectants, Misc. (EUCERIN) cream Apply topically as needed for dry skin. 04/01/20   Vanessa Bloomingdale, MD    Allergies Banana, Cantaloupe (diagnostic), Grapefruit extract, and Albumin human  Family History  Adopted: Yes  Problem Relation Age of Onset  . Mental illness Mother   . Mental illness Father   . Heart disease Maternal Grandmother   . Sickle cell trait Adoptive Mother   . Cleft palate Cousin        maternal side    Social History Social History   Tobacco Use  . Smoking status: Former Smoker    Packs/day: 0.75    Years: 2.00    Pack years: 1.50    Types: Cigarettes  . Smokeless tobacco: Never Used  . Tobacco comment: pt states she is quitting today  Substance Use Topics  . Alcohol use: Not Currently    Alcohol/week: 1.0 standard drinks    Types: 1 Cans of beer per week  .  Drug use: Not Currently    Types: Marijuana, "Crack" cocaine, Heroin    Review of Systems  Constitutional: No fever/chills Eyes: No visual changes. ENT: No sore throat. Cardiovascular: Denies chest pain. Respiratory: Denies shortness of breath. Gastrointestinal: No abdominal pain.  No nausea, no vomiting.  No diarrhea.  No constipation. Genitourinary: Negative for dysuria. Musculoskeletal:  Negative for back pain. Skin: Negative for rash. Neurological: Negative for headaches, focal weakness or numbness. Psychiatric:  Positive for depression with suicidal thoughts without plan.  ____________________________________________   PHYSICAL EXAM:  VITAL SIGNS: ED Triage Vitals  Enc Vitals Group     BP 04/15/20 0412 113/84     Pulse Rate 04/15/20 0412 61     Resp 04/15/20 0412 16     Temp 04/15/20 0412 98 F (36.7 C)     Temp Source 04/15/20 0412 Oral     SpO2 04/15/20 0412 97 %     Weight 04/15/20 0413 130 lb (59 kg)     Height 04/15/20 0413 5\' 3"  (1.6 m)     Head Circumference --      Peak Flow --      Pain Score 04/15/20 0413 0     Pain Loc --      Pain Edu? --      Excl. in GC? --     Constitutional: Alert and oriented.  Disheveled appearing and in no acute distress. Eyes: Conjunctivae are normal. PERRL. EOMI. Head: Atraumatic. Nose: No congestion/rhinnorhea. Mouth/Throat: Mucous membranes are moist.  Oropharynx non-erythematous. Neck: No stridor.   Cardiovascular: Normal rate, regular rhythm. Grossly normal heart sounds.  Good peripheral circulation. Respiratory: Normal respiratory effort.  No retractions. Lungs CTAB. Gastrointestinal: Soft and nontender. No distention. No abdominal bruits. No CVA tenderness. Musculoskeletal: No lower extremity tenderness nor edema.  No joint effusions. Neurologic:  Normal speech and language. No gross focal neurologic deficits are appreciated. No gait instability. Skin:  Skin is warm, dry and intact. No rash noted. Psychiatric: Mood and affect are flat. Speech and behavior are normal.  ____________________________________________   LABS (all labs ordered are listed, but only abnormal results are displayed)  Labs Reviewed  COMPREHENSIVE METABOLIC PANEL - Abnormal; Notable for the following components:      Result Value   Glucose, Bld 100 (*)    All other components within normal limits  SALICYLATE LEVEL - Abnormal;  Notable for the following components:   Salicylate Lvl <7.0 (*)    All other components within normal limits  ACETAMINOPHEN LEVEL - Abnormal; Notable for the following components:   Acetaminophen (Tylenol), Serum <10 (*)    All other components within normal limits  ETHANOL  CBC  URINE DRUG SCREEN, QUALITATIVE ([facility] ONLY)  POC URINE PREG, ED   ____________________________________________  EKG  None ____________________________________________  RADIOLOGY  ED MD interpretation: None  Official radiology report(s): No results found.  ____________________________________________   PROCEDURES  Procedure(s) performed (including Critical Care):  Procedures   ____________________________________________   INITIAL IMPRESSION / ASSESSMENT AND PLAN / ED COURSE  As part of my medical decision making, I reviewed the following data within the electronic MEDICAL RECORD NUMBER Nursing notes reviewed and incorporated, Labs reviewed, Old chart reviewed, A consult was requested and obtained from this/these consultant(s) Psychiatry and Notes from prior ED visits     04/17/20 Bridget Carpenter was evaluated in Emergency Department on 04/15/2020 for the symptoms described in the history of present illness. She was evaluated in the context of the global COVID-19 pandemic, which  necessitated consideration that the patient might be at risk for infection with the SARS-CoV-2 virus that causes COVID-19. Institutional protocols and algorithms that pertain to the evaluation of patients at risk for COVID-19 are in a state of rapid change based on information released by regulatory bodies including the CDC and federal and state organizations. These policies and algorithms were followed during the patient's care in the ED.    29 year old female with schizoaffective disorder who presents voluntarily to the ED for suicidal thoughts without plan.  Contracts for safety while in the emergency department.  Will keep  patient under voluntary status pending psychiatric evaluation and disposition.   Clinical Course as of Apr 15 556  Sun Apr 15, 2020  1607 The patient has been placed in psychiatric observation due to the need to provide a safe environment for the patient while obtaining psychiatric consultation and evaluation, as well as ongoing medical and medication management to treat the patient's condition. The patient has not been placed under full IVC at this time.     [JS]    Clinical Course User Index [JS] Irean Hong, MD     ____________________________________________   FINAL CLINICAL IMPRESSION(S) / ED DIAGNOSES  Final diagnoses:  Schizoaffective disorder, unspecified type [facility])     ED Discharge Orders    None       Note:  This document was prepared using Dragon voice recognition software and may include unintentional dictation errors.   Irean Hong, MD 04/15/20 [phone]

## 2020-04-15 NOTE — ED Notes (Signed)
Patient's belongings placed in labeled belongings bag to be secured on the unit.  White colored, fuzzy slides, black colored socks, gray colored pants, black colored zip up jacket, black colored t-shirt, white colored sports bra, gray colored underpants, 1 silver colored ring and 1 bronze colored ring.
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
## 2020-04-15 NOTE — ED Triage Notes (Signed)
Patient here voluntarily.  Reports having suicidal thoughts.

## 2020-04-16 NOTE — BH Assessment (Addendum)
TTS and Psych reassessment completed. Pt reports struggles with depression and feeling unhappy about her life and loosing all of her belongings before admission. Pt denies any active SI/HI or Hallucinations. Pt confirmed her safety.  Per Cindy Hazy, MD Pt will be discharged with the recommendation to follow up with resources provided

## 2020-04-16 NOTE — Consult Note (Signed)
[facility] Face-to-Face Psychiatry Consult   Reason for Consult: Altered mental status Referring Physician: Dr. Mayford Knife Patient Identification: [name] MRN:  [id number] Principal Diagnosis: Borderline personality disorder [facility]) Diagnosis:  Principal Problem:   Borderline personality disorder (HCC)   Total Time spent with patient: 30 minutes   Subjective: Unable to assess patient difficult to arouse.   Subjective: since prior eval discussed below: Evaluation reveals continue thoughts of self harm but without a specific plan or evidence of near term intent. This is consistent with prior evaluations and with the diagnosis of borderline personality.   Per TTS assessment note: [name] is an 29 y.o. female presenting to [facility] ED voluntarily for SI. Per triage note Patient here voluntarily. Reports having suicidal thoughts. During assessment patient was laying in her bed and appeared restless, patient could not lay still in bed and reported that she was cold and that was why she was restless. Patient was alert and oriented x4, appeared sad and depressed, affect was flat. Patient was just discharged from this ED on 04/05/20 for Cocaine abuse and returned on 04/12/20 for a skin infection. Patient is well known to the ED due to having a history of Cocaine abuse and Schizoaffective Disorder. Patient reported why she was presenting to ED "I'm having suicidal thoughts, I just don't want to be alive." Patient denies having an plan currently. When asked how long patient has been feeling suicidal she reports "since I was born." Patient reported she is currently homeless and has no support. Patient reported that she used Cocaine today "not much" and reported using via inhaling. Patient reported being clean in the past "for 7 years." Patient is not currently engaged in any outpatient treatment to assist with her substance abuse or her mental health. Patient reported having current AH and when asked  what voices she hears she reported "they just talking to me, I've been hearing them since I was born." Patient reported lack of sleep but appetite is fair. No UDS obtained as of yet. Patient reports SI/AH denies HI/VH.  Plan: The patient will be observe overnight and reassess in the a.m. to determine if she meets the criteria for psychiatric inpatient admission or can be discharged back to the community.   HPI: Per Dr. Mayford Knife; HPI [name] with a history of asthma, depression, schizoaffective disorder, substance abuse, PTSD who presents to the ED for acute psychosis.  Patient presents with bizarre behavior screaming for Jehovah.  Patient is agitated and combative.  Past Psychiatric History:  Depression Dissociative disorder or reaction H/O: Suicide attempt ODD (oppositional defiant disorder) PTSD (post traumatic stress disorder) Schizoaffective disorder (HCC) Substance abuse (HCC)  Patient 6 months post partum with neonate demise.  Previous medications: ABilify, Trazodone, Depakote  Risk to Self: Suicidal Ideation: Yes-Currently Present Suicidal Intent: Yes-Currently Present Is patient at risk for suicide?: Yes Suicidal Plan?: No Access to Means: No What has been your use of drugs/alcohol within the last 12 months?: Cocaine How many times?: 0 Triggers for Past Attempts: None known Intentional Self Injurious Behavior: NoneNo Risk to Others: Homicidal Ideation: No Thoughts of Harm to Others: No Current Homicidal Intent: No Current Homicidal Plan: No Access to Homicidal Means: No History of harm to others?: No Assessment of Violence: None Noted Does patient have access to weapons?: No Criminal Charges Pending?: Yes Describe Pending Criminal Charges: Unable to report her current charges Does patient have a court date: (Unknown)No Prior Inpatient Therapy: Prior Inpatient Therapy:  Yes Prior Therapy Dates: 12/04/2018, 05/03/2018, 12/25/2017,  07/18/2017, 05/07/2017 Prior Therapy Facilty/Provider(s): [facility] Reason for Treatment: Schizoaffective Disorder, Cocaine AbuseYes Prior Outpatient Therapy: Prior Outpatient Therapy: No Does patient have an ACCT team?: No Does patient have Intensive In-House Services?  : No Does patient have [facility] services? : No Does patient have P4CC services?: NoYes  Past Medical History:  Past Medical History:  Diagnosis Date  . Asthma   . Depression 2009   Inpatient psych admission for SI, dissociative fugue  . Dissociative disorder or reaction 2009  . Eczema   . H/O: suicide attempt   . ODD (oppositional defiant disorder)   . PTSD (post-traumatic stress disorder)   . Schizoaffective disorder (HCC)   . Substance abuse [facility])     Past Surgical History:  Procedure Laterality Date  . ADENOIDECTOMY    . TONSILLECTOMY     Family History:  Family History  Adopted: Yes  Problem Relation Age of Onset  . Mental illness Mother   . Mental illness Father   . Heart disease Maternal Grandmother   . Sickle cell trait Adoptive Mother   . Cleft palate Cousin        maternal side   Family Psychiatric  History:  Social History:  Social History   Substance and Sexual Activity  Alcohol Use Not Currently  . Alcohol/week: 1.0 standard drinks  . Types: 1 Cans of beer per week     Social History   Substance and Sexual Activity  Drug Use Not Currently  . Types: Marijuana, "Crack" cocaine, Heroin    Social History   Socioeconomic History  . Marital status: Single    Spouse name: Not on file  . Number of children: 1  . Years of education: Not on file  . Highest education level: Not on file  Occupational History  . Not on file  Tobacco Use  . Smoking status: Former Smoker    Packs/day: 0.75    Years: 2.00    Pack years: 1.50    Types: Cigarettes  . Smokeless tobacco: Never Used  . Tobacco comment: pt states she is quitting today  Substance and Sexual Activity  . Alcohol use: Not  Currently    Alcohol/week: 1.0 standard drinks    Types: 1 Cans of beer per week  . Drug use: Not Currently    Types: Marijuana, "Crack" cocaine, Heroin  . Sexual activity: Not Currently    Partners: Male  Other Topics Concern  . Not on file  Social History Narrative   Is homeless   Her mother is her daughter's gaurdian   Multiple psychiatric hospitalizations, not on any medication currently   Substance use disorder   Social Determinants of Health   Financial Resource Strain: High Risk  . Difficulty of Paying Living Expenses: Very hard  Food Insecurity: Food Insecurity Present  . Worried About Programme researcher, broadcasting/film/video in the Last Year: Often true  . Ran Out of Food in the Last Year: Often true  Transportation Needs: No Transportation Needs  . Lack of Transportation (Medical): No  . Lack of Transportation (Non-Medical): No  Physical Activity: Sufficiently Active  . Days of Exercise per Week: 4 days  . Minutes of Exercise per Session: 40 min  Stress: Stress Concern Present  . Feeling of Stress : Very much  Social Connections: Severely Isolated  . Frequency of Communication with Friends and Family: Never  . Frequency of Social Gatherings with Friends and Family: Never  . Attends Religious  Services: Never  . Active Member of Clubs or Organizations: No  . Attends Banker Meetings: Never  . Marital Status: Never married   Additional Social History:    Allergies:   Allergies  Allergen Reactions  . Banana Hives and Swelling  . Cantaloupe (Diagnostic) Hives  . Grapefruit Extract Itching and Swelling  . Albumin Human Other (See Comments)    Pt refuses all blood products.     Labs:  Results for orders placed or performed during the hospital encounter of 04/15/20 (from the past 48 hour(s))  Comprehensive metabolic panel     Status: Abnormal   Collection Time: 04/15/20  4:47 AM  Result Value Ref Range   Sodium 140 135 - 145 mmol/L   Potassium 3.5 3.5 - 5.1 mmol/L    Chloride 106 98 - 111 mmol/L   CO2 26 22 - 32 mmol/L   Glucose, Bld 100 (H) 70 - 99 mg/dL    Comment: Glucose reference range applies only to samples taken after fasting for at least 8 hours.   BUN 16 6 - 20 mg/dL   Creatinine, Ser 5.18 0.44 - 1.00 mg/dL   Calcium 9.2 8.9 - 84.1 mg/dL   Total Protein 7.7 6.5 - 8.1 g/dL   Albumin 4.1 3.5 - 5.0 g/dL   AST 22 15 - 41 U/L   ALT 15 0 - 44 U/L   Alkaline Phosphatase 43 38 - 126 U/L   Total Bilirubin 0.9 0.3 - 1.2 mg/dL   GFR calc non Af Amer >60 >60 mL/min   GFR calc Af Amer >60 >60 mL/min   Anion gap 8 5 - 15    Comment: Performed at [facility], [address] [place], [place] [address]  Ethanol     Status: None   Collection Time: 04/15/20  4:47 AM  Result Value Ref Range   Alcohol, Ethyl (B) <10 <10 mg/dL    Comment: (NOTE) Lowest detectable limit for serum alcohol is 10 mg/dL. For medical purposes only. Performed at [facility], [address] [place], [place] [address]   Salicylate level     Status: Abnormal   Collection Time: 04/15/20  4:47 AM  Result Value Ref Range   Salicylate Lvl <7.0 (L) 7.0 - 30.0 mg/dL    Comment: Performed at [facility], [address] [place], [place] [address]  Acetaminophen level     Status: Abnormal   Collection Time: 04/15/20  4:47 AM  Result Value Ref Range   Acetaminophen (Tylenol), Serum <10 (L) 10 - 30 ug/mL    Comment: (NOTE) Therapeutic concentrations vary significantly. A range of 10-30 ug/mL  may be an effective concentration for many patients. However, some  are best treated at concentrations outside of this range. Acetaminophen concentrations >150 ug/mL at 4 hours after ingestion  and >50 ug/mL at 12 hours after ingestion are often associated with  toxic reactions. Performed at [facility], [address] [place], [place] [address]   cbc     Status: None   Collection Time: 04/15/20  4:47 AM  Result Value Ref Range   WBC 7.0 4.0  - 10.5 K/uL   RBC 4.18 3.87 - 5.11 MIL/uL   Hemoglobin 13.1 12.0 - 15.0 g/dL   HCT 20.2 54.2 - 70.6 %   MCV 94.3 80.0 - 100.0 fL   MCH 31.3 26.0 - 34.0 pg   MCHC 33.2 30.0 - 36.0 g/dL   RDW 23.7 62.8 - 31.5 %  Platelets 297 150 - 400 K/uL   nRBC 0.0 0.0 - 0.2 %    Comment: Performed at [facility], [address] [place], [place] [address]  Respiratory Panel by RT PCR (Flu A&B, Covid) - Nasopharyngeal Swab     Status: None   Collection Time: 04/15/20 10:32 AM   Specimen: Nasopharyngeal Swab  Result Value Ref Range   SARS Coronavirus 2 by RT PCR NEGATIVE NEGATIVE    Comment: (NOTE) SARS-CoV-2 target nucleic acids are NOT DETECTED. The SARS-CoV-2 RNA is generally detectable in upper respiratoy specimens during the acute phase of infection. The lowest concentration of SARS-CoV-2 viral copies this assay can detect is 131 copies/mL. A negative result does not preclude SARS-Cov-2 infection and should not be used as the sole basis for treatment or other patient management decisions. A negative result may occur with  improper specimen collection/handling, submission of specimen other than nasopharyngeal swab, presence of viral mutation(s) within the areas targeted by this assay, and inadequate number of viral copies (<131 copies/mL). A negative result must be combined with clinical observations, patient history, and epidemiological information. The expected result is Negative. Fact Sheet for Patients:  https://www.moore.com/https://www.fda.gov/media/142436/download Fact Sheet for Healthcare Providers:  https://www.young.biz/https://www.fda.gov/media/142435/download This test is not yet ap proved or cleared by the [place] FDA and  has been authorized for detection and/or diagnosis of SARS-CoV-2 by FDA under an Emergency Use Authorization (EUA). This EUA will remain  in effect (meaning this test can be used) for the duration of the COVID-19 declaration under Section 564(b)(1) of the Act, 21 U.S.C. section  360bbb-3(b)(1), unless the authorization is terminated or revoked sooner.    Influenza A by PCR NEGATIVE NEGATIVE   Influenza B by PCR NEGATIVE NEGATIVE    Comment: (NOTE) The Xpert Xpress SARS-CoV-2/FLU/RSV assay is intended as an aid in  the diagnosis of influenza from Nasopharyngeal swab specimens and  should not be used as a sole basis for treatment. Nasal washings and  aspirates are unacceptable for Xpert Xpress SARS-CoV-2/FLU/RSV  testing. Fact Sheet for Patients: https://www.moore.com/https://www.fda.gov/media/142436/download Fact Sheet for Healthcare Providers: https://www.young.biz/https://www.fda.gov/media/142435/download This test is not yet approved or cleared by the [place] FDA and  has been authorized for detection and/or diagnosis of SARS-CoV-2 by  FDA under an Emergency Use Authorization (EUA). This EUA will remain  in effect (meaning this test can be used) for the duration of the  Covid-19 declaration under Section 564(b)(1) of the Act, 21  U.S.C. section 360bbb-3(b)(1), unless the authorization is  terminated or revoked. Performed at [facility], [address] [place], [place] [address]     No current facility-administered medications for this encounter.   Current Outpatient Medications  Medication Sig Dispense Refill  . Skin Protectants, Misc. (EUCERIN) cream Apply topically as needed for dry skin. 454 g 0     Psychiatric Specialty Exam: Physical Exam  Nursing note and vitals reviewed. Constitutional: She appears well-developed and well-nourished.  Cardiovascular: Normal rate.  Respiratory: Effort normal.  Musculoskeletal:        General: Normal range of motion.     Cervical back: Normal range of motion and neck supple.  Psychiatric: Her behavior is normal.    Review of Systems  Psychiatric/Behavioral: Positive for confusion and hallucinations. The patient is nervous/anxious.   All other systems reviewed and are negative.   Blood pressure (!) 113/44, pulse 78, temperature  98.7 F (37.1 C), temperature source Oral, resp. rate 18, height 5\' 3"  (1.6 m), weight 59 kg, SpO2 100 %,  unknown if currently breastfeeding.Body mass index is 23.03 kg/m.  General Appearance: Disheveled  Eye Contact:  Poor  Speech:  Slow  Volume:  Decreased  Mood:  Quiet and slow to answer  Affect:  Congruent  Thought Process:  Coherent and Linear  Orientation:  Full (Time, Place, and Person)  Thought Content:  Limited verbalization  Suicidal Thoughts:  Yes.  without intent/plan  Homicidal Thoughts:  No  Memory:  Negative  Judgement:  NA  Insight:  NA  Psychomotor Activity:  Normal  Concentration:  Concentration: NA and Attention Span: NA  Recall:  NA  Fund of Knowledge:  NA  Language:  NA  Akathisia:  NA  Handed:  Right  AIMS (if indicated):     Assets:  Others:  UTA  ADL's:  Intact  Cognition:  WNL  Sleep:    Okay     Treatment Plan Summary: The benefit of further evaluation is unclear. She is uninterested in treatment and I do not see how further medication adjustment is appropriate or necessary at this time. DBT or other therapy targeting her personality disorder would be the most useful intervention once she supports active involvement in her treatment.  Disposition: Patient discharged from ED  Alesia Morin, MD  04/03/2020 10:00PM

## 2020-04-16 NOTE — ED Provider Notes (Signed)
Emergency Medicine Observation Re-evaluation Note  [name], seen on rounds today.  Pt initially presented to the ED for complaints of Psychiatric Evaluation Currently, the patient is sleeping.  Physical Exam  BP (!) 113/44 (BP Location: Right Arm)   Pulse 78   Temp 98.7 F (37.1 C) (Oral)   Resp 18   Ht 5\' 3"  (1.6 m)   Wt 59 kg   LMP  (LMP Unknown)   SpO2 100%   BMI 23.03 kg/m  Physical Exam  ED Course / MDM  EKG:  I have reviewed the labs performed to date as well as medications administered while in observation.  Recent changes in the last 24 hours include none. Plan  Current plan is for psych dispo. Patient is not under full IVC at this time.   , Don Perking, MD 04/16/20 [phone]

## 2020-04-16 NOTE — ED Notes (Signed)
Patient talking to psychiatrist and TTS 

## 2020-04-16 NOTE — ED Notes (Signed)
Hourly rounding reveals patient sleeping in room. No complaints, stable, in no acute distress. Q15 minute rounds and monitoring via Security Cameras to continue. 

## 2020-04-16 NOTE — ED Notes (Signed)
Patient received breakfast tray 

## 2020-04-16 NOTE — ED Notes (Signed)
Hourly rounding reveals patient in rest room. No complaints, stable, in no acute distress. Q15 minute rounds and monitoring via Security Cameras to continue. 

## 2020-04-16 NOTE — ED Notes (Addendum)
Patient discharged home, patient received discharge papers. Patient got belongings and verbalized he has received all of his belongings. Patient appropriate and cooperative, Denies SI/HI AVH. Vital signs taken. NAD noted. 

## 2020-04-16 NOTE — ED Provider Notes (Signed)
The patient has been evaluated at bedside by Dr. Burgess Estelle psychiatry.  Patient is clinically stable.  Not felt to be a danger to self or others.  No SI or Hi.  No indication for inpatient psychiatric admission at this time.  Appropriate for continued outpatient therapy.    Willy Eddy, MD 04/16/20 1520

## 2020-04-16 NOTE — ED Notes (Signed)
Pt given milk per request

## 2020-04-16 NOTE — ED Notes (Signed)
Patient took a shiower

## 2020-05-12 ENCOUNTER — Encounter: Payer: Self-pay | Admitting: Emergency Medicine

## 2020-05-12 ENCOUNTER — Other Ambulatory Visit: Payer: Self-pay

## 2020-05-12 ENCOUNTER — Emergency Department
Admission: EM | Admit: 2020-05-12 | Discharge: 2020-05-12 | Disposition: A | Discharge status: Home / Self Care | Payer: Medicaid Other | Attending: Emergency Medicine | Admitting: Emergency Medicine

## 2020-05-12 DIAGNOSIS — Z3200 Encounter for pregnancy test, result unknown: Secondary | ICD-10-CM | POA: Diagnosis present

## 2020-05-12 DIAGNOSIS — Z5321 Procedure and treatment not carried out due to patient leaving prior to being seen by health care provider: Secondary | ICD-10-CM | POA: Diagnosis not present

## 2020-05-12 NOTE — ED Notes (Signed)
Pt called for ride to come pick her up while she was being triaged. Unsure if pt is going to stay to be evaluated

## 2020-05-12 NOTE — ED Triage Notes (Signed)
Pt to ED via [facility] from home for possible pregnancy. Pt states that she thinks she is pregnant because she has been leaking amniotic fluid. When asked how far along she thinks she is pt states that she is unsure because she is so thin. Pt denies vaginal bleeding. Pt is in NAD, in triage eating chips.

## 2020-05-12 NOTE — ED Notes (Addendum)
Pt did not submit urine sample, results entered under wrong patient.

## 2020-05-12 NOTE — ED Notes (Signed)
Pt observed walking outside the front of the ER using her phone, standing on the sidewalk, no distress noted

## 2020-05-12 NOTE — ED Notes (Signed)
Was called into bathroom by another patient stating this patient was blasting music in the bathroom. Went to bathroom to ask patient to turn down the music, pt complied, then said she couldn't pee.  As I was walking out the music started again and asked patient again to turn the music down because other people have to use the bathroom. Pt said ok, I told patient she could go outside to listen to music if she wanted to.

## 2020-05-12 NOTE — ED Notes (Signed)
Pt given urine cup to collect sample for POC

## 2020-05-14 LAB — POCT PREGNANCY, URINE

## 2020-05-15 ENCOUNTER — Emergency Department
Admission: EM | Admit: 2020-05-15 | Discharge: 2020-05-18 | Disposition: A | Discharge status: Home / Self Care | Payer: Medicaid Other | Attending: Emergency Medicine | Admitting: Emergency Medicine

## 2020-05-15 ENCOUNTER — Encounter: Payer: Self-pay | Admitting: Emergency Medicine

## 2020-05-15 DIAGNOSIS — F320 Major depressive disorder, single episode, mild: Secondary | ICD-10-CM | POA: Diagnosis not present

## 2020-05-15 DIAGNOSIS — J45909 Unspecified asthma, uncomplicated: Secondary | ICD-10-CM | POA: Diagnosis not present

## 2020-05-15 DIAGNOSIS — Z5900 Homelessness unspecified: Secondary | ICD-10-CM

## 2020-05-15 DIAGNOSIS — T50901A Poisoning by unspecified drugs, medicaments and biological substances, accidental (unintentional), initial encounter: Secondary | ICD-10-CM | POA: Diagnosis present

## 2020-05-15 DIAGNOSIS — F4310 Post-traumatic stress disorder, unspecified: Secondary | ICD-10-CM | POA: Diagnosis present

## 2020-05-15 DIAGNOSIS — F603 Borderline personality disorder: Secondary | ICD-10-CM | POA: Diagnosis present

## 2020-05-15 DIAGNOSIS — F1414 Cocaine abuse with cocaine-induced mood disorder: Secondary | ICD-10-CM | POA: Diagnosis present

## 2020-05-15 DIAGNOSIS — Z87891 Personal history of nicotine dependence: Secondary | ICD-10-CM | POA: Diagnosis not present

## 2020-05-15 DIAGNOSIS — F4320 Adjustment disorder, unspecified: Secondary | ICD-10-CM | POA: Diagnosis present

## 2020-05-15 DIAGNOSIS — J9600 Acute respiratory failure, unspecified whether with hypoxia or hypercapnia: Secondary | ICD-10-CM | POA: Diagnosis present

## 2020-05-15 DIAGNOSIS — F19959 Other psychoactive substance use, unspecified with psychoactive substance-induced psychotic disorder, unspecified: Secondary | ICD-10-CM | POA: Diagnosis present

## 2020-05-15 DIAGNOSIS — F1420 Cocaine dependence, uncomplicated: Secondary | ICD-10-CM | POA: Diagnosis present

## 2020-05-15 DIAGNOSIS — R451 Restlessness and agitation: Secondary | ICD-10-CM | POA: Diagnosis present

## 2020-05-15 DIAGNOSIS — F1220 Cannabis dependence, uncomplicated: Secondary | ICD-10-CM | POA: Diagnosis present

## 2020-05-15 DIAGNOSIS — F332 Major depressive disorder, recurrent severe without psychotic features: Secondary | ICD-10-CM | POA: Diagnosis present

## 2020-05-15 DIAGNOSIS — F17200 Nicotine dependence, unspecified, uncomplicated: Secondary | ICD-10-CM | POA: Diagnosis present

## 2020-05-15 DIAGNOSIS — F1910 Other psychoactive substance abuse, uncomplicated: Secondary | ICD-10-CM | POA: Diagnosis present

## 2020-05-15 DIAGNOSIS — F1010 Alcohol abuse, uncomplicated: Secondary | ICD-10-CM | POA: Diagnosis present

## 2020-05-15 MED ORDER — LORAZEPAM 2 MG/ML IJ SOLN
INTRAMUSCULAR | Status: AC
  Administered 2020-05-15: 2 mg via INTRAMUSCULAR
  Filled 2020-05-15: qty 1

## 2020-05-15 MED ORDER — DIPHENHYDRAMINE HCL 50 MG/ML IJ SOLN: 50.0000 mg | Freq: Once | INTRAMUSCULAR | Status: AC

## 2020-05-15 MED ORDER — DIPHENHYDRAMINE HCL 50 MG/ML IJ SOLN
INTRAMUSCULAR | Status: AC
  Administered 2020-05-15: 50 mg via INTRAMUSCULAR
  Filled 2020-05-15: qty 1

## 2020-05-15 MED ORDER — LORAZEPAM 2 MG/ML IJ SOLN: 2.0000 mg | Freq: Once | INTRAMUSCULAR | Status: AC

## 2020-05-15 MED ORDER — HALOPERIDOL LACTATE 5 MG/ML IJ SOLN: 5.0000 mg | Freq: Once | INTRAMUSCULAR | Status: AC

## 2020-05-15 MED ORDER — HALOPERIDOL LACTATE 5 MG/ML IJ SOLN
INTRAMUSCULAR | Status: AC
  Administered 2020-05-15: 5 mg via INTRAMUSCULAR
  Filled 2020-05-15: qty 1

## 2020-05-15 NOTE — ED Notes (Addendum)
Pt remains in her clothing but shoes and sock removed while pt is sleeping. Pt searched by [employer] before entering into ED. Pt is sleeping in bed. Bed at lowest position, environment secured.   MD aware that blood and urine will not be obtained until later in shift due to pts mental state.
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
## 2020-05-15 NOTE — ED Provider Notes (Signed)
[facility] Emergency Department Provider Note  ____________________________________________  Time seen: Approximately 10:22 PM  I have reviewed the triage vital signs and the nursing notes.   HISTORY  Chief Complaint Agitation   Level 5 Caveat: Portions of the History and Physical including HPI and review of systems are unable to be completely obtained due to patient being a poor historian   HPI [name] with a history of depression, PTSD, schizoaffective disorder, substance abuse who was brought to the ED under involuntary commitment by police due to agitation and combativeness.  Reportedly, the patient was angry with her 39 year old child at home due to the child going outside with a large kitchen knife, and the child's grandmother not intervening.  This resulted in the patient being outside, walking alone where she was picked up by police.  Patient is uncooperative with interview, not answering any questions.      Past Medical History:  Diagnosis Date  . Asthma   . Depression 2009   Inpatient psych admission for SI, dissociative fugue  . Dissociative disorder or reaction 2009  . Eczema   . H/O: suicide attempt   . ODD (oppositional defiant disorder)   . PTSD (post-traumatic stress disorder)   . Schizoaffective disorder (HCC)   . Substance abuse [facility])      Patient Active Problem List   Diagnosis Date Noted  . Adjustment disorder   . MDD (major depressive disorder), recurrent episode, severe (HCC) 08/31/2019  . Indication for care in labor and delivery, antepartum 08/29/2019  . Hydrops fetalis 08/29/2019  . Indication for care in labor or delivery 08/14/2019  . Dehydration 08/14/2019  . Rubella non-immune status, antepartum 08/01/2019  . Pain of round ligament affecting pregnancy, antepartum 08/01/2019  . Labor and delivery indication for care or intervention 07/30/2019  . Homelessness 07/25/2019  . Decreased  fetal movement 07/02/2019  . Club foot, fetal, affecting care of mother, antepartum 06/29/2019  . Supervision of high risk pregnancy, antepartum 06/19/2019  . Cocaine abuse with cocaine-induced mood disorder (HCC) 04/15/2019  . 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/03/2018  . Bacterial vaginosis 12/25/2017  . PTSD (post-traumatic stress disorder) 09/02/2017  . MRSA carrier 07/18/2017  . Overdose 07/16/2017  . Aspiration pneumonia (HCC) 07/16/2017  . Acute respiratory failure (HCC) 07/16/2017  . Polysubstance abuse (HCC) 07/16/2017  . Eczema 05/08/2017  . Borderline personality disorder (HCC) 11/06/2016  . Cocaine use disorder, severe, dependence (HCC) 11/06/2016  . Cannabis use disorder, moderate, dependence (HCC) 11/06/2016  . Alcohol use disorder, mild, abuse 11/06/2016  . Asthma 09/23/2011  . Tobacco use disorder 09/15/2009     Past Surgical History:  Procedure Laterality Date  . ADENOIDECTOMY    . TONSILLECTOMY       Prior to Admission medications   Not on File     Allergies Banana, Cantaloupe (diagnostic), Grapefruit extract, and Albumin human   Family History  Adopted: Yes  Problem Relation Age of Onset  . Mental illness Mother   . Mental illness Father   . Heart disease Maternal Grandmother   . Sickle cell trait Adoptive Mother   . Cleft palate Cousin        maternal side    Social History Social History   Tobacco Use  . Smoking status: Former Smoker    Packs/day: 0.75    Years: 2.00    Pack years: 1.50    Types: Cigarettes  . Smokeless tobacco: Never Used  . Tobacco comment:  pt states she is quitting today  Substance Use Topics  . Alcohol use: Not Currently    Alcohol/week: 1.0 standard drinks    Types: 1 Cans of beer per week  . Drug use: Not Currently    Types: Marijuana, "Crack" cocaine, Heroin    Review of Systems Level 5 Caveat: Portions of the History and Physical including HPI and review of systems are unable to be  completely obtained due to patient being a poor historian   Constitutional:   No known fever.  ENT:   No rhinorrhea. Cardiovascular:   No chest pain or syncope. Respiratory:   No dyspnea or cough. Gastrointestinal:   Negative for abdominal pain, vomiting and diarrhea.  Musculoskeletal:   Negative for focal pain or swelling ____________________________________________   PHYSICAL EXAM:  VITAL SIGNS: ED Triage Vitals [05/15/20 2216]  Enc Vitals Group     BP (!) 129/94     Pulse Rate 100     Resp 18     Temp (!) 97.2 F (36.2 C)     Temp Source Axillary     SpO2 97 %     Weight      Height      Head Circumference      Peak Flow      Pain Score      Pain Loc      Pain Edu?      Excl. in [place]?    Exam limited by patient uncooperativeness and unwilling to participate. Vital signs reviewed, nursing assessments reviewed.   Constitutional:   Alert and oriented. Non-toxic appearance. Eyes:   Conjunctivae are normal. EOMI.  ENT      Head:   Normocephalic and atraumatic.      Nose:   No congestion/rhinnorhea.          Neck:   No meningismus. Full ROM. Cardiovascular:   RRR. Cap refill less than 2 seconds. Respiratory:   Normal respiratory effort without tachypnea/retractions.  Musculoskeletal:   Normal range of motion in all extremities. No joint effusions.  No lower extremity tenderness.  No edema. Neurologic:   Normal speech and language.  Motor grossly intact. No acute focal neurologic deficits are appreciated.  Skin:    Skin is warm, dry and intact. No rash noted.  No petechiae, purpura, or bullae.  ____________________________________________    LABS (pertinent positives/negatives) (all labs ordered are listed, but only abnormal results are displayed) Labs Reviewed  SARS CORONAVIRUS 2 BY RT PCR ([facility])  ACETAMINOPHEN LEVEL  COMPREHENSIVE METABOLIC PANEL  ETHANOL  SALICYLATE LEVEL  CBC WITH DIFFERENTIAL/PLATELET   URINE DRUG SCREEN, QUALITATIVE ([place])  POC URINE PREG, ED   ____________________________________________   EKG    ____________________________________________    RADIOLOGY  No results found.  ____________________________________________   PROCEDURES Procedures  ____________________________________________    CLINICAL IMPRESSION / ASSESSMENT AND PLAN / ED COURSE  Medications ordered in the ED: Medications  haloperidol lactate (HALDOL) injection 5 mg (has no administration in time range)  diphenhydrAMINE (BENADRYL) injection 50 mg (has no administration in time range)  LORazepam (ATIVAN) injection 2 mg (has no administration in time range)  diphenhydrAMINE (BENADRYL) 50 MG/ML injection (has no administration in time range)  haloperidol lactate (HALDOL) 5 MG/ML injection (has no administration in time range)  LORazepam (ATIVAN) 2 MG/ML injection (has no administration in time range)    Pertinent labs & imaging results that were available during my care of the patient were  reviewed by me and considered in my medical decision making (see chart for details).   [name] was evaluated in Emergency Department on 05/15/2020 for the symptoms described in the history of present illness. She was evaluated in the context of the global COVID-19 pandemic, which necessitated consideration that the patient might be at risk for infection with the SARS-CoV-2 virus that causes COVID-19. Institutional protocols and algorithms that pertain to the evaluation of patients at risk for COVID-19 are in a state of rapid change based on information released by regulatory bodies including the CDC and federal and state organizations. These policies and algorithms were followed during the patient's care in the ED.   Patient presents with agitation, under IVC initiated by police.  Requiring Haldol, Ativan, Benadryl to calm the patient and help facilitate a safe environment while awaiting  psychiatry evaluation.  The patient has been placed in psychiatric observation due to the need to provide a safe environment for the patient while obtaining psychiatric consultation and evaluation, as well as ongoing medical and medication management to treat the patient's condition.  The patient has been placed under full IVC at this time.       ____________________________________________   FINAL CLINICAL IMPRESSION(S) / ED DIAGNOSES    Final diagnoses:  Agitation     ED Discharge Orders    None      Portions of this note were generated with dragon dictation software. Dictation errors may occur despite best attempts at proofreading.   Sharman Cheek, MD 05/15/20 2225

## 2020-05-16 LAB — COMPREHENSIVE METABOLIC PANEL
ALT: 21 U/L (ref 0–44)
AST: 45 U/L — ABNORMAL HIGH (ref 15–41)
Albumin: 3.9 g/dL (ref 3.5–5.0)
Alkaline Phosphatase: 56 U/L (ref 38–126)
Anion gap: 10 (ref 5–15)
BUN: 13 mg/dL (ref 6–20)
CO2: 25 mmol/L (ref 22–32)
Calcium: 8.9 mg/dL (ref 8.9–10.3)
Chloride: 106 mmol/L (ref 98–111)
Creatinine, Ser: 0.63 mg/dL (ref 0.44–1.00)
GFR calc Af Amer: >60 mL/min (ref >60)
GFR calc non Af Amer: >60 mL/min (ref >60)
Glucose, Bld: 169 mg/dL — ABNORMAL HIGH (ref 70–99)
Potassium: 3.3 mmol/L — ABNORMAL LOW (ref 3.5–5.1)
Sodium: 141 mmol/L (ref 135–145)
Total Bilirubin: 1.2 mg/dL (ref 0.3–1.2)
Total Protein: 7.4 g/dL (ref 6.5–8.1)

## 2020-05-16 LAB — SALICYLATE LEVEL: Salicylate Lvl: <7 mg/dL — ABNORMAL LOW (ref 7.0–30.0)

## 2020-05-16 LAB — CBC WITH DIFFERENTIAL/PLATELET
Abs Immature Granulocytes: 0.02 10*3/uL (ref 0.00–0.07)
Basophils Absolute: 0 10*3/uL (ref 0.0–0.1)
Basophils Relative: 0 %
Eosinophils Absolute: 0.2 10*3/uL (ref 0.0–0.5)
Eosinophils Relative: 3 %
HCT: 42.6 % (ref 36.0–46.0)
Hemoglobin: 14 g/dL (ref 12.0–15.0)
Immature Granulocytes: 0 %
Lymphocytes Relative: 23 %
Lymphs Abs: 1.7 10*3/uL (ref 0.7–4.0)
MCH: 31.3 pg (ref 26.0–34.0)
MCHC: 32.9 g/dL (ref 30.0–36.0)
MCV: 95.1 fL (ref 80.0–100.0)
Monocytes Absolute: 0.6 10*3/uL (ref 0.1–1.0)
Monocytes Relative: 9 %
Neutro Abs: 4.8 10*3/uL (ref 1.7–7.7)
Neutrophils Relative %: 65 %
Platelets: 245 10*3/uL (ref 150–400)
RBC: 4.48 MIL/uL (ref 3.87–5.11)
RDW: 12.9 % (ref 11.5–15.5)
WBC: 7.3 10*3/uL (ref 4.0–10.5)
nRBC: 0 % (ref 0.0–0.2)

## 2020-05-16 LAB — ETHANOL: Alcohol, Ethyl (B): <10 mg/dL (ref <10)

## 2020-05-16 LAB — ACETAMINOPHEN LEVEL: Acetaminophen (Tylenol), Serum: <10 ug/mL — ABNORMAL LOW (ref 10–30)

## 2020-05-16 NOTE — ED Notes (Signed)
Unable to assess due patient being drowsy at this time. Will continue to monitor.

## 2020-05-16 NOTE — ED Notes (Addendum)
Pt given dinner tray and sprite.  

## 2020-05-16 NOTE — ED Provider Notes (Signed)
Emergency Medicine Observation Re-evaluation Note  [name], seen on rounds today.  Pt initially presented to the ED for complaints of Mental Health Problem Currently, the patient is resting in no acute distress.  Physical Exam  BP (!) 129/94 (BP Location: Left Arm)   Pulse 100   Temp (!) 97.2 F (36.2 C) (Axillary) Comment (Src): pt refusing oral temp  Resp 18   SpO2 97%  Physical Exam  ED Course / MDM  EKG:    I have reviewed the labs performed to date as well as medications administered while in observation.  Recent changes in the last 24 hours include patient received IM calming medicine.  She was allowed to rest overnight.  Will attempt blood draw when she wakes up in the morning. Plan  Current plan is for psychiatric disposition. Patient is under full IVC at this time.   Irean Hong, MD 05/16/20 [phone]

## 2020-05-16 NOTE — ED Notes (Signed)

## 2020-05-16 NOTE — ED Notes (Signed)
Pt nods head to indicate "no" when assessed for SI/HI/AVH. Refuses to talk to writer and appears sleepy. Offered food and fluids but she did not answer. Observed resting quietly in bed, respirations even and unlabored.

## 2020-05-16 NOTE — ED Notes (Signed)
Pt refused to consent to blood draw

## 2020-05-16 NOTE — ED Notes (Signed)
Pt refuses to have blood drawn. Educated on why this is important but pt still refused

## 2020-05-16 NOTE — ED Notes (Signed)
Given breakfast tray. 

## 2020-05-16 NOTE — ED Notes (Signed)
Blood draw obtained by this Clinical research associate.

## 2020-05-17 NOTE — ED Notes (Signed)
Pt in hallway yelling and demanding to leave.  Security officers had patient return to her room.

## 2020-05-17 NOTE — ED Notes (Signed)
IVC/Still has Psych Consult Pending

## 2020-05-17 NOTE — ED Notes (Signed)
Patient asking to speak to psychiatry. Patient informed that night shift psychiatrist would come to speak with her later this evening. Patient satisfied with answer.

## 2020-05-17 NOTE — ED Notes (Signed)
Meal tray provided.

## 2020-05-17 NOTE — ED Notes (Signed)
Pt is not dressed in behavioral scrubs.  [place] nurse aware.

## 2020-05-17 NOTE — ED Notes (Signed)
Pt asking if she can leave today due to a court date. Denies SI/HI.

## 2020-05-17 NOTE — ED Notes (Signed)
Meal tray placed on sink.

## 2020-05-17 NOTE — ED Provider Notes (Signed)
Emergency Medicine Observation Re-evaluation Note  [name], seen on rounds today.  Pt initially presented to the ED for complaints of Mental Health Problem Currently, the patient is resting in NAD.  Physical Exam  BP (!) 129/94 (BP Location: Left Arm)   Pulse 100   Temp (!) 97.2 F (36.2 C) (Axillary) Comment (Src): pt refusing oral temp  Resp 18   SpO2 97%  Physical Exam  ED Course / MDM  EKG:    I have reviewed the labs performed to date as well as medications administered while in observation.  Recent changes in the last 24 hours include none. Plan  Current plan is for psych dispo. Patient is under full IVC at this time.   Shaune Pollack, MD 05/17/20 [phone]

## 2020-05-18 NOTE — ED Provider Notes (Signed)
Emergency Medicine Observation Re-evaluation Note  [name], seen on rounds today.  Pt initially presented to the ED for complaints of Mental Health Problem Currently, the patient is eating breakfast.  Physical Exam  BP 130/60 (BP Location: Right Arm)   Pulse 90   Temp 97.6 F (36.4 C) (Oral)   Resp 20   SpO2 96%  Physical Exam  ED Course / MDM  EKG:    I have reviewed the labs performed to date as well as medications administered while in observation.  Recent changes in the last 24 hours include no changes. Plan  Current plan is for psychiatry disposition. Patient is under full IVC at this time.   Jene Every, MD 05/18/20 [phone]

## 2020-05-18 NOTE — ED Notes (Signed)
Pt asked multiple times to get out of the shower and pt not responding or getting out, this tech and Officer Mac turned water off to shower.

## 2020-05-18 NOTE — ED Notes (Signed)
Pt taking a shower 

## 2020-05-18 NOTE — ED Notes (Signed)
Pt yelling in hallway, arguing with officer. Pt wanting to leave.

## 2020-05-18 NOTE — ED Provider Notes (Signed)
Patient cleared for discharge by psychiatry, IVC overturned.   Jene Every, MD 05/18/20 1152

## 2020-05-18 NOTE — ED Notes (Signed)
Pt discharged home. Pt refused VS and RN unable to obtain signature. Belongings returned to patient.  Discharge instructions given to patient.

## 2020-05-18 NOTE — BH Assessment (Addendum)
Assessment Note  [name] is an 29 y.o. female who presents to the ER via law enforcement due to her mother having concerns about her behaviors and mental state. Per the patient, she was fussing at her daughter for playing with a knife outside. The patient didn't take it from her because she didn't want her to run away with it, while it was in her hand. Patient reports, she believes her mother was the one who called law enforcement and doesn't know exactly why.   However, per the IVC, law enforcement was called to the house several times because the patient believed something was wrong with her daughter. The patient used a knife to cut the screen door and was trying to get in the house. Upon arrival to the ER, patient was agitated and irritable and required IM medications.  During the interview the patient was calm, cooperative and pleasant. She was able to provide appropriate answers to the questions. Throughout the interview, the patient denied SI/HI and AV/H. She admits to the drug use but was unsure if she used the day she was brought to the ER.  Patient is well known to the ER for similar presentation and usually due to substance induced psychosis. However, with this ER visit patient's UDS had resulted by the time of this assessment.  Diagnosis: Schizoaffective  Past Medical History:  Past Medical History:  Diagnosis Date  . Asthma   . Depression 2009   Inpatient psych admission for SI, dissociative fugue  . Dissociative disorder or reaction 2009  . Eczema   . H/O: suicide attempt   . ODD (oppositional defiant disorder)   . PTSD (post-traumatic stress disorder)   . Schizoaffective disorder (HCC)   . Substance abuse [facility])     Past Surgical History:  Procedure Laterality Date  . ADENOIDECTOMY    . TONSILLECTOMY      Family History:  Family History  Adopted: Yes  Problem Relation Age of Onset  . Mental illness Mother   . Mental illness Father   . Heart disease  Maternal Grandmother   . Sickle cell trait Adoptive Mother   . Cleft palate Cousin        maternal side    Social History:  reports that she has quit smoking. Her smoking use included cigarettes. She has a 1.50 pack-year smoking history. She has never used smokeless tobacco. She reports previous alcohol use of about 1.0 standard drinks of alcohol per week. She reports previous drug use. Drugs: Marijuana, "Crack" cocaine, and Heroin.  Additional Social History:  Alcohol / Drug Use Pain Medications: See PTA Prescriptions: See PTA Over the Counter: See PTA History of alcohol / drug use?: Yes Longest period of sobriety (when/how long): Unable to to quantify  CIWA: CIWA-Ar BP: 130/60 Pulse Rate: 90 COWS:    Allergies:  Allergies  Allergen Reactions  . Banana Hives and Swelling  . Cantaloupe (Diagnostic) Hives  . Grapefruit Extract Itching and Swelling  . Albumin Human Other (See Comments)    Pt refuses all blood products.     Home Medications: (Not in a hospital admission)   OB/GYN Status:  No LMP recorded.  General Assessment Data Location of Assessment: [facility] ED TTS Assessment: In system Is this a Tele or Face-to-Face Assessment?: Face-to-Face Is this an Initial Assessment or a Re-assessment for this encounter?: Initial Assessment Patient Accompanied by:: N/A Language Other than English: No Living Arrangements: Other (Comment)(Private Home) What gender do you identify as?: Female Marital status:  Single Pregnancy Status: No Living Arrangements: Other (Comment) Can pt return to current living arrangement?: Yes Admission Status: Involuntary Petitioner: Police Is patient capable of signing voluntary admission?: No(Under IVC) Referral Source: Self/Family/Friend Insurance type: Medicaid  Medical Screening Exam ([place]) Medical Exam completed: Yes  Crisis Care Plan Living Arrangements: Other (Comment) Legal Guardian: Other:(Self) Name of Psychiatrist: None  reported Name of Therapist: None reported  Education Status Is patient currently in school?: No Is the patient employed, unemployed or receiving disability?: Unemployed, Receiving disability income  Risk to self with the past 6 months Suicidal Ideation: No Has patient been a risk to self within the past 6 months prior to admission? : No Suicidal Intent: No Has patient had any suicidal intent within the past 6 months prior to admission? : No Is patient at risk for suicide?: No Suicidal Plan?: No Has patient had any suicidal plan within the past 6 months prior to admission? : No Access to Means: No What has been your use of drugs/alcohol within the last 12 months?: Cocaine & Alcohol Previous Attempts/Gestures: No How many times?: 0 Other Self Harm Risks: Reports of none Triggers for Past Attempts: None known Intentional Self Injurious Behavior: None Family Suicide History: Unknown Recent stressful life event(s): Other (Comment) Persecutory voices/beliefs?: No Depression: Yes Depression Symptoms: Feeling angry/irritable, Isolating Substance abuse history and/or treatment for substance abuse?: No Suicide prevention information given to non-admitted patients: Not applicable  Risk to Others within the past 6 months Homicidal Ideation: No Does patient have any lifetime risk of violence toward others beyond the six months prior to admission? : No Thoughts of Harm to Others: No Current Homicidal Intent: No Current Homicidal Plan: No Access to Homicidal Means: No Identified Victim: Reports of none History of harm to others?: No Assessment of Violence: None Noted Violent Behavior Description: Reports of none Does patient have access to weapons?: No Criminal Charges Pending?: No Describe Pending Criminal Charges: Reports of none Does patient have a court date: No Is patient on probation?: No  Psychosis Hallucinations: None noted Delusions: None noted  Mental Status  Report Appearance/Hygiene: Unremarkable, In scrubs Eye Contact: Good Motor Activity: Unremarkable, Freedom of movement Speech: Logical/coherent, Unremarkable Level of Consciousness: Alert Mood: Anxious, Sad, Pleasant Affect: Appropriate to circumstance, Sad Anxiety Level: Minimal Thought Processes: Coherent, Relevant Judgement: Unimpaired Orientation: Person, Place, Time, Situation, Appropriate for developmental age Obsessive Compulsive Thoughts/Behaviors: Minimal  Cognitive Functioning Concentration: Normal Memory: Recent Intact, Remote Intact Is patient IDD: No Insight: Fair Impulse Control: Fair Appetite: Fair Have you had any weight changes? : No Change Sleep: No Change Total Hours of Sleep: 0 Vegetative Symptoms: None  ADLScreening [facility] Assessment Services) Patient's cognitive ability adequate to safely complete daily activities?: Yes Patient able to express need for assistance with ADLs?: Yes Independently performs ADLs?: Yes (appropriate for developmental age)  Prior Inpatient Therapy Prior Inpatient Therapy: Yes Prior Therapy Dates: 12/04/2018, 05/03/2018, 12/25/2017, 07/18/2017, 05/07/2017 Prior Therapy Facilty/Provider(s): [facility] Reason for Treatment: Schizoaffective Disorder, Cocaine Abuse  Prior Outpatient Therapy Prior Outpatient Therapy: No Does patient have an ACCT team?: No Does patient have Intensive In-House Services?  : No Does patient have [facility] services? : No Does patient have P4CC services?: No  ADL Screening (condition at time of admission) Patient's cognitive ability adequate to safely complete daily activities?: Yes Is the patient deaf or have difficulty hearing?: No Does the patient have difficulty seeing, even when wearing glasses/contacts?: No Does the patient have difficulty concentrating, remembering, or making decisions?: No  Patient able to express need for assistance with ADLs?: Yes Does the patient have difficulty dressing or bathing?:  No Independently performs ADLs?: Yes (appropriate for developmental age) Does the patient have difficulty walking or climbing stairs?: No Weakness of Legs: None Weakness of Arms/Hands: None  Home Assistive Devices/Equipment Home Assistive Devices/Equipment: None  Therapy Consults (therapy consults require a physician order) PT Evaluation Needed: No OT Evalulation Needed: No SLP Evaluation Needed: No Abuse/Neglect Assessment (Assessment to be complete while patient is alone) Abuse/Neglect Assessment Can Be Completed: Yes Physical Abuse: Denies Verbal Abuse: Denies Sexual Abuse: Denies Exploitation of patient/patient's resources: Denies Self-Neglect: Denies Values / Beliefs Cultural Requests During Hospitalization: None Spiritual Requests During Hospitalization: None Consults Spiritual Care Consult Needed: No Transition of Care Team Consult Needed: No  Child/Adolescent Assessment Running Away Risk: Denies(Patient is an adult)  Disposition:   On Site Evaluation by:   Reviewed with Physician:    Lilyan Gilford MS, LCAS, [facility], N[facility] Therapeutic Triage Specialist 05/18/2020 12:32 AM

## 2020-05-18 NOTE — ED Notes (Addendum)
Pt storming up to nurses desk demanding to take a shower and to see a psychiatrist. This tech explained to pt that psych MD would be around sometime today and I would get things for her to take a shower shortly,  pt provided with 2 bottles of hospital approved soap, 1 wash rag, 1 black hospital approved comb, 1 blue hospital approved tooth brush, 1 toothpaste, 1 hospital approved Deoderant, 1 pair of burgundy scrubs,  1 pair of hospital approved underwear and 1 pair of hospital approved socks

## 2020-05-18 NOTE — Consult Note (Addendum)
[facility] Face-to-Face Psychiatry Consult   Reason for Consult: Altered mental status Referring Physician: Dr. Mayford Knife Patient Identification: Bridget [place] Bridget Carpenter MRN:  [id number] Principal Diagnosis: <principal problem not specified> Diagnosis:  Active Problems:   Tobacco use disorder   Borderline personality disorder (HCC)   Cocaine use disorder, severe, dependence (HCC)   Cannabis use disorder, moderate, dependence (HCC)   Alcohol use disorder, mild, abuse   Overdose   Acute respiratory failure (HCC)   Polysubstance abuse (HCC)   PTSD (post-traumatic stress disorder)   Psychoactive substance-induced psychosis (HCC)   Cocaine abuse with cocaine-induced mood disorder (HCC)   Homelessness   MDD (major depressive disorder), recurrent episode, severe (HCC)   Adjustment disorder   Total Time spent with patient: 30 minutes   Subjective: " My 36 year old daughter had a knife, and I wanted her to put it down.  Mom called the police, and they brought me to the hospital." Bridget [place] Bridget Carpenter is a 29 y.o. female presented to [facility] ED via law enforcement under involuntary commitment status (IVC). The patient was very agitated, aggressive, combative, which led her to be medicated.  The patient was unable to be assessed due to her behaviors.  Per the ED triage nurse note, the patient is refusing blood, urine, and dress out. The patient up out of bed in the hallway. The patient moved back into the room with the help of security. The patient is escalating and unwilling to stay still  In her room. The patient continues to state, "Yall trying to kill me. They done poisoned me. I'm going to die. I hope yall are happy."  The patient was seen face-to-face by this provider; chart reviewed and consulted with Dr. Colon Branch on 05/17/2020 due to the patient's care. It was discussed with the EDP that the patient would be under observation overnight and discharged in the morning once her IVC has been rescinded. The  patient is alert and oriented x3, calm, easy to arouse, cooperative, and mood-congruent with affect. The patient does not appear to be responding to internal and external stimuli. She is not presenting with delusional thinking. The patient denies auditory and visual hallucinations. The patient denies suicidal, homicidal, or self-harm ideations. The patient is not presenting with psychotic or paranoid behaviors. During an encounter with the patient, she was able to answer questions appropriately.  Plan: The patient will be observed overnight and discharge in the a.m. once her IVC has been rescinded.  HPI: Per Dr. Scotty Court; Bridget Carpenter is a 29 y.o. female with a history of depression, PTSD, schizoaffective disorder, substance abuse who was brought to the ED under involuntary commitment by police due to agitation and combativeness.  Reportedly, the patient was angry with her 65 year old child at home due to the child going outside with a large kitchen knife, and the child's grandmother not intervening.  This resulted in the patient being outside, walking alone where she was picked up by police.  Patient is uncooperative with interview, not answering any questions.  Past Psychiatric History:  Depression Dissociative disorder or reaction H/O: Suicide attempt ODD (oppositional defiant disorder) PTSD (post traumatic stress disorder) Schizoaffective disorder (HCC) Substance abuse (HCC)  Risk to Self: Suicidal Ideation: No Suicidal Intent: No Is patient at risk for suicide?: No Suicidal Plan?: No Access to Means: No What has been your use of drugs/alcohol within the last 12 months?: Cocaine & Alcohol How many times?: 0 Other Self Harm Risks: Reports of none Triggers for Past Attempts: None known Intentional Self  Injurious Behavior: NoneNo Risk to Others: Homicidal Ideation: No Thoughts of Harm to Others: No Current Homicidal Intent: No Current Homicidal Plan: No Access to Homicidal  Means: No Identified Victim: Reports of none History of harm to others?: No Assessment of Violence: None Noted Violent Behavior Description: Reports of none Does patient have access to weapons?: No Criminal Charges Pending?: No Describe Pending Criminal Charges: Reports of none Does patient have a court date: NoNo Prior Inpatient Therapy: Prior Inpatient Therapy: Yes Prior Therapy Dates: 12/04/2018, 05/03/2018, 12/25/2017, 07/18/2017, 05/07/2017 Prior Therapy Facilty/Provider(s): [facility] Reason for Treatment: Schizoaffective Disorder, Cocaine AbuseYes Prior Outpatient Therapy: Prior Outpatient Therapy: No Does patient have an ACCT team?: No Does patient have Intensive In-House Services?  : No Does patient have [facility] services? : No Does patient have P4CC services?: NoYes  Past Medical History:  Past Medical History:  Diagnosis Date  . Asthma   . Depression 2009   Inpatient psych admission for SI, dissociative fugue  . Dissociative disorder or reaction 2009  . Eczema   . H/O: suicide attempt   . ODD (oppositional defiant disorder)   . PTSD (post-traumatic stress disorder)   . Schizoaffective disorder (HCC)   . Substance abuse [facility](HCC)     Past Surgical History:  Procedure Laterality Date  . ADENOIDECTOMY    . TONSILLECTOMY     Family History:  Family History  Adopted: Yes  Problem Relation Age of Onset  . Mental illness Mother   . Mental illness Father   . Heart disease Maternal Grandmother   . Sickle cell trait Adoptive Mother   . Cleft palate Cousin        maternal side   Family Psychiatric  History:  Social History:  Social History   Substance and Sexual Activity  Alcohol Use Not Currently  . Alcohol/week: 1.0 standard drinks  . Types: 1 Cans of beer per week     Social History   Substance and Sexual Activity  Drug Use Not Currently  . Types: Marijuana, "Crack" cocaine, Heroin    Social History   Socioeconomic History  . Marital status: Single    Spouse  name: Not on file  . Number of children: 1  . Years of education: Not on file  . Highest education level: Not on file  Occupational History  . Not on file  Tobacco Use  . Smoking status: Former Smoker    Packs/day: 0.75    Years: 2.00    Pack years: 1.50    Types: Cigarettes  . Smokeless tobacco: Never Used  . Tobacco comment: pt states she is quitting today  Substance and Sexual Activity  . Alcohol use: Not Currently    Alcohol/week: 1.0 standard drinks    Types: 1 Cans of beer per week  . Drug use: Not Currently    Types: Marijuana, "Crack" cocaine, Heroin  . Sexual activity: Not Currently    Partners: Male  Other Topics Concern  . Not on file  Social History Narrative   Is homeless   Her mother is her daughter's gaurdian   Multiple psychiatric hospitalizations, not on any medication currently   Substance use disorder   Social Determinants of Health   Financial Resource Strain: High Risk  . Difficulty of Paying Living Expenses: Very hard  Food Insecurity: Food Insecurity Present  . Worried About Programme researcher, broadcasting/film/videounning Out of Food in the Last Year: Often true  . Ran Out of Food in the Last Year: Often true  Transportation Needs: No Transportation  Needs  . Lack of Transportation (Medical): No  . Lack of Transportation (Non-Medical): No  Physical Activity: Sufficiently Active  . Days of Exercise per Week: 4 days  . Minutes of Exercise per Session: 40 min  Stress: Stress Concern Present  . Feeling of Stress : Very much  Social Connections: Severely Isolated  . Frequency of Communication with Friends and Family: Never  . Frequency of Social Gatherings with Friends and Family: Never  . Attends Religious Services: Never  . Active Member of Clubs or Organizations: No  . Attends Banker Meetings: Never  . Marital Status: Never married   Additional Social History:    Allergies:   Allergies  Allergen Reactions  . Banana Hives and Swelling  . Cantaloupe (Diagnostic)  Hives  . Grapefruit Extract Itching and Swelling  . Albumin Human Other (See Comments)    Pt refuses all blood products.     Labs:  Results for orders placed or performed during the hospital encounter of 05/15/20 (from the past 48 hour(s))  Acetaminophen level     Status: Abnormal   Collection Time: 05/16/20  7:33 PM  Result Value Ref Range   Acetaminophen (Tylenol), Serum <10 (L) 10 - 30 ug/mL    Comment: (NOTE) Therapeutic concentrations vary significantly. A range of 10-30 ug/mL  may be an effective concentration for many patients. However, some  are best treated at concentrations outside of this range. Acetaminophen concentrations >150 ug/mL at 4 hours after ingestion  and >50 ug/mL at 12 hours after ingestion are often associated with  toxic reactions. Performed at [facility], [address] [place], [place] [address]   Comprehensive metabolic panel     Status: Abnormal   Collection Time: 05/16/20  7:33 PM  Result Value Ref Range   Sodium 141 135 - 145 mmol/L   Potassium 3.3 (L) 3.5 - 5.1 mmol/L   Chloride 106 98 - 111 mmol/L   CO2 25 22 - 32 mmol/L   Glucose, Bld 169 (H) 70 - 99 mg/dL    Comment: Glucose reference range applies only to samples taken after fasting for at least 8 hours.   BUN 13 6 - 20 mg/dL   Creatinine, Ser 7.32 0.44 - 1.00 mg/dL   Calcium 8.9 8.9 - 20.2 mg/dL   Total Protein 7.4 6.5 - 8.1 g/dL   Albumin 3.9 3.5 - 5.0 g/dL   AST 45 (H) 15 - 41 U/L   ALT 21 0 - 44 U/L   Alkaline Phosphatase 56 38 - 126 U/L   Total Bilirubin 1.2 0.3 - 1.2 mg/dL   GFR calc non Af Amer >60 >60 mL/min   GFR calc Af Amer >60 >60 mL/min   Anion gap 10 5 - 15    Comment: Performed at [facility], [address] [place], [place] [address]  Ethanol     Status: None   Collection Time: 05/16/20  7:33 PM  Result Value Ref Range   Alcohol, Ethyl (B) <10 <10 mg/dL    Comment: (NOTE) Lowest detectable limit for serum alcohol is 10 mg/dL. For medical  purposes only. Performed at [facility], [address] [place], [place] [address]   Salicylate level     Status: Abnormal   Collection Time: 05/16/20  7:33 PM  Result Value Ref Range   Salicylate Lvl <7.0 (L) 7.0 - 30.0 mg/dL    Comment: Performed at [facility], [address] [place], [place] [address]  CBC  with Differential     Status: None   Collection Time: 05/16/20  7:33 PM  Result Value Ref Range   WBC 7.3 4.0 - 10.5 K/uL   RBC 4.48 3.87 - 5.11 MIL/uL   Hemoglobin 14.0 12.0 - 15.0 g/dL   HCT 42.6 36.0 - 46.0 %   MCV 95.1 80.0 - 100.0 fL   MCH 31.3 26.0 - 34.0 pg   MCHC 32.9 30.0 - 36.0 g/dL   RDW 12.9 11.5 - 15.5 %   Platelets 245 150 - 400 K/uL   nRBC 0.0 0.0 - 0.2 %   Neutrophils Relative % 65 %   Neutro Abs 4.8 1.7 - 7.7 K/uL   Lymphocytes Relative 23 %   Lymphs Abs 1.7 0.7 - 4.0 K/uL   Monocytes Relative 9 %   Monocytes Absolute 0.6 0.1 - 1.0 K/uL   Eosinophils Relative 3 %   Eosinophils Absolute 0.2 0.0 - 0.5 K/uL   Basophils Relative 0 %   Basophils Absolute 0.0 0.0 - 0.1 K/uL   Immature Granulocytes 0 %   Abs Immature Granulocytes 0.02 0.00 - 0.07 K/uL    Comment: Performed at [facility], [address] [place], [place] [address]    No current facility-administered medications for this encounter.   No current outpatient medications on file.    Musculoskeletal: Strength & Muscle Tone: within normal limits Gait & Station: normal Patient leans: N/A  Psychiatric Specialty Exam: Physical Exam  Nursing note and vitals reviewed. Constitutional: She appears well-developed and well-nourished.  Cardiovascular: Normal rate.  Respiratory: Effort normal.  Musculoskeletal:        General: Normal range of motion.     Cervical back: Normal range of motion and neck supple.  Psychiatric: Her behavior is normal.    Review of Systems  Psychiatric/Behavioral: Positive for confusion and hallucinations. The patient is  nervous/anxious.   All other systems reviewed and are negative.   Blood pressure 130/60, pulse 90, temperature 97.6 F (36.4 C), temperature source Oral, resp. rate 20, SpO2 96 %, unknown if currently breastfeeding.There is no height or weight on file to calculate BMI.  General Appearance: Disheveled  Eye Contact:  Minimal  Speech:  Clear and Coherent  Volume:  Normal  Mood:  Angry and Anxious  Affect:  Congruent  Thought Process:  Coherent  Orientation:  Full (Time, Place, and Person)  Thought Content:  Logical  Suicidal Thoughts:  No  Homicidal Thoughts:  No  Memory:  Immediate;   Good Recent;   Good Remote;   Good  Judgement:  Fair  Insight:  Shallow  Psychomotor Activity:  Normal  Concentration:  Concentration: Fair and Attention Span: Fair  Recall:  [employer] of Knowledge:  Fair  Language:  Good  Akathisia:  Negative  Handed:  Right  AIMS (if indicated):     Assets:  Communication Skills Desire for Improvement Social Support  ADL's:  Intact  Cognition:  WNL  Sleep:    Okay     Treatment Plan Summary: Daily contact with patient to assess and evaluate symptoms and progress in treatment, Medication management and Plan The patient will remain on the observation overnight and discharged in the a.m. once her IVC has been rescinded.  Disposition: No evidence of imminent risk to self or others at present.   Patient does not meet criteria for psychiatric inpatient admission. Supportive therapy provided about ongoing stressors. The patient will remain under observation overnight and discharge in the a.m. once IVC  has been rescinded.  Gillermo Murdoch, NP  05/17/2020 10:00PM

## 2020-05-18 NOTE — ED Notes (Signed)
VOL,rescinded pend D/C

## 2020-05-18 NOTE — Final Progress Note (Signed)
Physician Final Progress Note  Patient ID: [name] Shayan Perotti MRN: [id number] DOB/AGE: 06-Jan-1991 28 y.o.  Admit date: 05/15/2020 Admitting provider: No admitting provider for patient encounter. Discharge date: 05/18/2020   Admission Diagnoses:  Adjustment disorder of emotions and conduct Bipolar disorder mixed Polysubstance dependence   Personality disorder NOS  Discharge Diagnoses:   Same for Psych    Active Problems:   Tobacco use disorder   Borderline personality disorder (HCC)   Cocaine use disorder, severe, dependence (HCC)   Cannabis use disorder, moderate, dependence (HCC)   Alcohol use disorder, mild, abuse   Overdose   Acute respiratory failure (HCC)   Polysubstance abuse (HCC)   PTSD (post-traumatic stress disorder)   Psychoactive substance-induced psychosis (HCC)   Cocaine abuse with cocaine-induced mood disorder (HCC)   Homelessness   MDD (major depressive disorder), recurrent episode, severe (HCC)   Adjustment disorder    Consults:   Psychiatry    Significant Findings/ Diagnostic Studies: {none major    Procedures: rounds    Discharge Condition: stable   Disposition:    Home to grand mom and friends, she wants to walk she says, transport offered   She is on no meds refuses meds    MS Exam brief Strange and odd Oriented to person place date and time  Consciousness not clouded or fluctuant  Mood somewhat elevated Affect elevated slightly  No frank other psychosis or mania UDS not fully drawn, not clear iniitally if she was under substance influence Judgement reliability and insight all poor  No other movement problems  Ambulatory   SI and HI --contracts for safety she does not have any other SI HI or plans   Initially was combative, she may have come down from influence of substances  Discusses argument with grand mom over care of her child   Although this lady is important need for community services, compliance low but she  does not meet IVC criteria to continue  Rescinded before D/C ER MD notified of discharge   Seen face to face.           Diet: regular  Discharge Activity:   Advised to go to [employer], day treatment ---outpatient med management AA NA church related support  Family guidance parent classes   Allergies as of 05/18/2020      Reactions   Banana Hives, Swelling   Cantaloupe (diagnostic) Hives   Grapefruit Extract Itching, Swelling   Albumin Human Other (See Comments)   Pt refuses all blood products.       Medication List    You have not been prescribed any medications.      Total time spent taking care of this patient: 40 minutes  Signed: Roselind Messier 05/18/2020, 11:25 AM

## 2020-05-20 ENCOUNTER — Emergency Department
Admission: EM | Admit: 2020-05-20 | Discharge: 2020-05-21 | Disposition: A | Discharge status: Other Acute Inpatient Hospital | Payer: Medicaid Other | Attending: Emergency Medicine | Admitting: Emergency Medicine

## 2020-05-20 ENCOUNTER — Other Ambulatory Visit: Payer: Self-pay

## 2020-05-20 ENCOUNTER — Encounter: Payer: Self-pay | Admitting: Emergency Medicine

## 2020-05-20 DIAGNOSIS — F259 Schizoaffective disorder, unspecified: Secondary | ICD-10-CM | POA: Insufficient documentation

## 2020-05-20 DIAGNOSIS — Z87891 Personal history of nicotine dependence: Secondary | ICD-10-CM | POA: Diagnosis not present

## 2020-05-20 DIAGNOSIS — Z046 Encounter for general psychiatric examination, requested by authority: Secondary | ICD-10-CM | POA: Diagnosis present

## 2020-05-20 DIAGNOSIS — J45909 Unspecified asthma, uncomplicated: Secondary | ICD-10-CM | POA: Diagnosis not present

## 2020-05-20 DIAGNOSIS — R4689 Other symptoms and signs involving appearance and behavior: Secondary | ICD-10-CM

## 2020-05-20 LAB — CBC
HCT: 36.7 % (ref 36.0–46.0)
Hemoglobin: 11.8 g/dL — ABNORMAL LOW (ref 12.0–15.0)
MCH: 31.2 pg (ref 26.0–34.0)
MCHC: 32.2 g/dL (ref 30.0–36.0)
MCV: 97.1 fL (ref 80.0–100.0)
Platelets: 223 10*3/uL (ref 150–400)
RBC: 3.78 MIL/uL — ABNORMAL LOW (ref 3.87–5.11)
RDW: 12.9 % (ref 11.5–15.5)
WBC: 6.2 10*3/uL (ref 4.0–10.5)
nRBC: 0 % (ref 0.0–0.2)

## 2020-05-20 LAB — COMPREHENSIVE METABOLIC PANEL
ALT: 18 U/L (ref 0–44)
AST: 30 U/L (ref 15–41)
Albumin: 4 g/dL (ref 3.5–5.0)
Alkaline Phosphatase: 43 U/L (ref 38–126)
Anion gap: 7 (ref 5–15)
BUN: 14 mg/dL (ref 6–20)
CO2: 25 mmol/L (ref 22–32)
Calcium: 8.9 mg/dL (ref 8.9–10.3)
Chloride: 108 mmol/L (ref 98–111)
Creatinine, Ser: 0.69 mg/dL (ref 0.44–1.00)
GFR calc Af Amer: >60 mL/min (ref >60)
GFR calc non Af Amer: >60 mL/min (ref >60)
Glucose, Bld: 83 mg/dL (ref 70–99)
Potassium: 3.5 mmol/L (ref 3.5–5.1)
Sodium: 140 mmol/L (ref 135–145)
Total Bilirubin: 1.3 mg/dL — ABNORMAL HIGH (ref 0.3–1.2)
Total Protein: 7.2 g/dL (ref 6.5–8.1)

## 2020-05-20 LAB — ACETAMINOPHEN LEVEL: Acetaminophen (Tylenol), Serum: <10 ug/mL — ABNORMAL LOW (ref 10–30)

## 2020-05-20 LAB — ETHANOL: Alcohol, Ethyl (B): <10 mg/dL (ref <10)

## 2020-05-20 LAB — SALICYLATE LEVEL: Salicylate Lvl: <7 mg/dL — ABNORMAL LOW (ref 7.0–30.0)

## 2020-05-20 MED ORDER — ZIPRASIDONE MESYLATE 20 MG IM SOLR
20.0000 mg | Freq: Once | INTRAMUSCULAR | Status: DC
  Filled 2020-05-20: qty 20

## 2020-05-20 MED ORDER — DIPHENHYDRAMINE HCL 25 MG PO CAPS
50.0000 mg | ORAL_CAPSULE | Freq: Once | ORAL | Status: AC
  Administered 2020-05-20: 50 mg via ORAL
  Filled 2020-05-20: qty 2

## 2020-05-20 NOTE — ED Notes (Signed)
Pt belongings:  Red sweatshirt, leopard print cami, tie-dye style sweatpants, jeans, tan bra, black shoes, black cell phone with charger, 1 dollar bill, 3 credit cards and [place] ID.   Pt also wearing what she states is a "Crematory ring from her dead daughter" that she is refusing to remove.

## 2020-05-20 NOTE — ED Notes (Signed)
Hourly rounding reveals patient awake in room, no sign of distress assessed. No complaints, stable, in no acute distress. Q15 minute rounds and monitoring via Psychologist, counselling to continue.

## 2020-05-20 NOTE — ED Provider Notes (Addendum)
[facility] Emergency Department Provider Note  Time seen: 12:41 PM  I have reviewed the triage vital signs and the nursing notes.   HISTORY  Chief Complaint Altered mental status/agitation  HPI [name] Kaisa Wofford is a 29 y.o. female with a past medical history of of PTSD, schizophrenia, substance abuse, presents to the emergency department by police for agitation and bizarre behavior.  According to police patient was acting erratic, was agitated at [facility] making threats against others as well as her self so they were told by [facility] to bring the patient to the emergency department.  Here the patient is in handcuffs he is currently calm however when they attempted to release her from handcuffs she began kicking at the officer.  Patient is calm when she is talking to me.  Denies any drugs or alcohol today.  Denies any complaints.  Patient does seem confused at times for instance when asked if she used any drugs today she began talking about her feet.   Past Medical History:  Diagnosis Date  . Asthma   . Depression 2009   Inpatient psych admission for SI, dissociative fugue  . Dissociative disorder or reaction 2009  . Eczema   . H/O: suicide attempt   . ODD (oppositional defiant disorder)   . PTSD (post-traumatic stress disorder)   . Schizoaffective disorder (HCC)   . Substance abuse [facility])     Patient Active Problem List   Diagnosis Date Noted  . Adjustment disorder   . MDD (major depressive disorder), recurrent episode, severe (HCC) 08/31/2019  . Indication for care in labor and delivery, antepartum 08/29/2019  . Hydrops fetalis 08/29/2019  . Indication for care in labor or delivery 08/14/2019  . Dehydration 08/14/2019  . Rubella non-immune status, antepartum 08/01/2019  . Pain of round ligament affecting pregnancy, antepartum 08/01/2019  . Labor and delivery indication for care or intervention 07/30/2019  . Homelessness 07/25/2019  . Decreased fetal movement  07/02/2019  . Club foot, fetal, affecting care of mother, antepartum 06/29/2019  . Supervision of high risk pregnancy, antepartum 06/19/2019  . Cocaine abuse with cocaine-induced mood disorder (HCC) 04/15/2019  . 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/03/2018  . Bacterial vaginosis 12/25/2017  . PTSD (post-traumatic stress disorder) 09/02/2017  . MRSA carrier 07/18/2017  . Overdose 07/16/2017  . Aspiration pneumonia (HCC) 07/16/2017  . Acute respiratory failure (HCC) 07/16/2017  . Polysubstance abuse (HCC) 07/16/2017  . Eczema 05/08/2017  . Borderline personality disorder (HCC) 11/06/2016  . Cocaine use disorder, severe, dependence (HCC) 11/06/2016  . Cannabis use disorder, moderate, dependence (HCC) 11/06/2016  . Alcohol use disorder, mild, abuse 11/06/2016  . Asthma 09/23/2011  . Tobacco use disorder 09/15/2009    Past Surgical History:  Procedure Laterality Date  . ADENOIDECTOMY    . TONSILLECTOMY      Prior to Admission medications   Not on File    Allergies  Allergen Reactions  . Banana Hives and Swelling  . Cantaloupe (Diagnostic) Hives  . Grapefruit Extract Itching and Swelling  . Albumin Human Other (See Comments)    Pt refuses all blood products.     Family History  Adopted: Yes  Problem Relation Age of Onset  . Mental illness Mother   . Mental illness Father   . Heart disease Maternal Grandmother   . Sickle cell trait Adoptive Mother   . Cleft palate Cousin        maternal side    Social History Social History   Tobacco Use  .  Smoking status: Former Smoker    Packs/day: 0.75    Years: 2.00    Pack years: 1.50    Types: Cigarettes  . Smokeless tobacco: Never Used  . Tobacco comment: pt states she is quitting today  Substance Use Topics  . Alcohol use: Not Currently    Alcohol/week: 1.0 standard drinks    Types: 1 Cans of beer per week  . Drug use: Not Currently    Types: Marijuana, "Crack" cocaine, Heroin    Review of  Systems Constitutional: Negative for fever. Cardiovascular: Negative for chest pain. Respiratory: Negative for shortness of breath. Gastrointestinal: Negative for abdominal pain Genitourinary: Negative for urinary compaints Neurological: Negative for headache All other ROS negative, although possibly limited due to confusion.  ____________________________________________   PHYSICAL EXAM:  Constitutional: Alert.  Patient will occasionally answer questions and converse normally and other times refuses to answer questions. Eyes: Normal exam ENT      Head: Normocephalic and atraumatic.      Mouth/Throat: Mucous membranes are moist. Cardiovascular: Normal rate, regular rhythm.  Respiratory: Normal respiratory effort without tachypnea nor retractions. Breath sounds are clear  Gastrointestinal: Soft and nontender. No distention.  Musculoskeletal: Nontender with normal range of motion in all extremities.  Neurologic:  Normal speech and language. No gross focal neurologic deficits Skin:  Skin is warm, dry and intact.  Psychiatric: Denies SI or HI currently.  ____________________________________________   INITIAL IMPRESSION / ASSESSMENT AND PLAN / ED COURSE  Pertinent labs & imaging results that were available during my care of the patient were reviewed by me and considered in my medical decision making (see chart for details).   Patient presents to the emergency department for acute agitation/erratic behavior.  Per report had snuck into her mother's house/trespassing, possibly naked.  Denies drugs or alcohol.  They brought the patient to [facility], RN stated patient began acting aggressive and agitated was placed in handcuffs and brought to the ED.  Here the patient is calm when she is speaking to me however did kick at the police officer.  We will IVC the patient, check labs have psychiatry in TTS evaluate.  Patient may require sedation.  Psychiatry is seen they will be reassessing in the  morning.  Patient's lab work is largely [place].   [name] Shayan Hitchcock was evaluated in Emergency Department on 05/20/2020 for the symptoms described in the history of present illness. She was evaluated in the context of the global COVID-19 pandemic, which necessitated consideration that the patient might be at risk for infection with the SARS-CoV-2 virus that causes COVID-19. Institutional protocols and algorithms that pertain to the evaluation of patients at risk for COVID-19 are in a state of rapid change based on information released by regulatory bodies including the CDC and federal and state organizations. These policies and algorithms were followed during the patient's care in the ED.  The patient has been placed in psychiatric observation due to the need to provide a safe environment for the patient while obtaining psychiatric consultation and evaluation, as well as ongoing medical and medication management to treat the patient's condition.  The patient has been placed under full IVC at this time.   ____________________________________________   FINAL CLINICAL IMPRESSION(S) / ED DIAGNOSES  Agitation   Harvest Dark, MD 05/20/20 [id number]    Harvest Dark, MD 05/20/20 [phone]

## 2020-05-20 NOTE — ED Notes (Signed)
Pt was heading to bathroom and the ED tech gave her a urine cup and reminded her that we needed her to pee in the cup. After several minutes the pt did not urinate in the cup.

## 2020-05-20 NOTE — ED Notes (Addendum)
Pt going back and forth to each Pure[employer] hand sanitizer dispenser rubbing it all over her hands, arms, face, and hair. Seems to be trying to bathe in it. Pt states that she wants a pap smear because she feels that someone tried to infect her private parts.

## 2020-05-20 NOTE — ED Notes (Signed)
Pt yelling for nurse again, EDT Brittney to room, states she is allergic to bananas and ate a banana.  When asked the patient why she ate the banana knowing that she was allergic and she stated "I don't know" pt c/o mouth itching, No respiratory distress noted, no swelling to tongue noted.   Pt continues to eat pizza, VSS, asking for sprite. Dr. Scotty Court notified.

## 2020-05-20 NOTE — Progress Notes (Signed)
  Per admission assessment note:Pt arrived via [employer] officer, per officer, pt was making threats to kill people and cut herself on legs, pt also went to her mother's house uninvitied and per the officer that responded stated the patient had ran upstairs at her mother's house and started getting naked and taking a shower in front of the officer, stated the patient had cut her feet   NP and TTS counselor attempted to assess patient for psychiatric evaluation.  Patient observed resting in bed and unarousable.  Patient will answer a few questions and does back off.  RN denied medication sedation.  UDS still pending.  Chart review patient was recently assessed.  Patient to be reassessed when she is able to participate in assessment.
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
## 2020-05-20 NOTE — ED Notes (Signed)
Pt given [place] sandwich tray and cup of sprite.

## 2020-05-20 NOTE — ED Notes (Addendum)
Pt in hallway loudly requesting sprite, pt redirected by sheriff's deputy. Pt talking about eating a banana even though she was allergic because she "fucking wanted to, and if yall put one on my tray again I'm going to fucking eat it". Pt redirected back to room. RN Morrie Sheldon made aware of pts outburst and requests.

## 2020-05-20 NOTE — ED Notes (Signed)
Hourly rounding reveals patient awake in room. No complaints, stable, in no acute distress. Q15 minute rounds and monitoring via Rover and Officer to continue.  

## 2020-05-20 NOTE — ED Notes (Signed)
Pt was given a sandwich tray.  

## 2020-05-20 NOTE — ED Notes (Signed)
Pt was given a warm blanket

## 2020-05-20 NOTE — ED Notes (Signed)
Hourly rounding reveals patient is sleeping in room. No complaints, stable, in no acute distress. Q15 minute rounds and monitoring via Psychologist, counselling to continue.

## 2020-05-20 NOTE — ED Triage Notes (Addendum)
Pt arrived via [employer] officer, per officer, pt was making threats to kill people and cut herself on legs, pt also went to her mother's house uninvitied and per the officer that responded stated the patient had ran upstairs at her mother's house and started getting naked and taking a shower in front of the officer, stated the patient had cut her feet.  Pt is homeless per [employer] officer.  The patient has been trespassed from her mother's house.  [facility] was taken to [facility] initially, per [employer], pt was showing signs of combativeness and [facility] instructed [employer] to take her straight to the hospital.  Per officer pt gave 2 different identities at [facility].   Pt denies any drug use.

## 2020-05-20 NOTE — ED Notes (Signed)
Pt was given a cup of sprite with no lid or straw. Pt was also given a new pair socks per her request.

## 2020-05-20 NOTE — ED Notes (Signed)
Pt allowed EDT Brett to draw labs and take vitals, Pt more calm at this time.  Pt asking for food and drink at this time.

## 2020-05-20 NOTE — Consult Note (Signed)
[facility] Face-to-Face Psychiatry Consult   Reason for Consult:  Psych evaluation  Referring Physician:  Dr. Kerman Passey Patient Identification: Bridget Carpenter MRN:  [id number] Principal Diagnosis: Schizoaffective disorder ([place]) Diagnosis:  Principal Problem:   Schizoaffective disorder ([place])   Total Time spent with patient: 30 minutes  Subjective:   Bridget Carpenter is a 29 y.o. female Per admission assessment note:  Pt arrived via [employer] officer, per officer, pt was making threats to kill people and cut herself on legs, pt also went to her mother's house uninvitied and per the officer that responded stated the patient had ran upstairs at her mother's house and started getting naked and taking a shower in front of the officer, stated the patient had cut her feet.  HPI:  Bridget Carpenter, 29 y.o., female patient seen face to face by this provider; chart reviewed and consulted with Dr. Dwyane Dee on 05/20/20.  On evaluation Bridget Carpenter laying in bed and very lethargic.  He states that she is hear because she was cutting her feet.  She states that she was trying to hurt herself. Patient also endorsed command hallucinations stating that she does whatever the voices tell her to to.  She admits to drinking alcohol and doing cocaine today.  She denies having a psychiatrist and therapist and denies taking and medication for her mental health.   During evaluation Bridget Carpenter is laying in bed fairly difficult to arose; she is alert/oriented x 4; confused/labile/cooperative; and mood congruent with affect.  Patient is speaking in a muffled tone almost inaudible, and slowed pace; with poor eye contact.  Her thought process is tangential  and irrelevant at times; There is no indication that *she is currently responding to internal/external stimuli or experiencing delusional thought content, however she endorses AVH in the recent past.  Patient endorses suicidal/self-harm ideation but  denies homicidal ideation. Patient has been difficult to assess due to lethargy.   Past Psychiatric History: schizoaffective disorder  Risk to Self: Suicidal Ideation: Yes-Currently Present Suicidal Intent: Yes-Currently Present Is patient at risk for suicide?: No Suicidal Plan?: No Access to Means: (Unsure) What has been your use of drugs/alcohol within the last 12 months?: (Cocaine, heroine, marijuana) Other Self Harm Risks: (cuts self) Triggers for Past Attempts: None known Intentional Self Injurious Behavior: Cutting Risk to Others: Homicidal Ideation: No Thoughts of Harm to Others: No Current Homicidal Intent: No Current Homicidal Plan: No Access to Homicidal Means: No History of harm to others?: No Assessment of Violence: None Noted Does patient have access to weapons?: No Criminal Charges Pending?: Yes Describe Pending Criminal Charges: ("criminal matter") Does patient have a court date: Yes Court Date: (May 31, 2020) Prior Inpatient Therapy: Prior Inpatient Therapy: Yes Prior Therapy Dates: 12/04/2018, 05/03/2018, 12/25/2017, 07/18/2017, 05/07/2017 Prior Therapy Facilty/Provider(s): [facility] Reason for Treatment: Schizoaffective Disorder, Cocaine Abuse Prior Outpatient Therapy: Prior Outpatient Therapy: No Does patient have an ACCT team?: Unknown Does patient have Intensive In-House Services?  : Unknown Does patient have [facility] services? : Unknown Does patient have P4CC services?: Unknown  Past Medical History:  Past Medical History:  Diagnosis Date  . Asthma   . Depression 2009   Inpatient psych admission for SI, dissociative fugue  . Dissociative disorder or reaction 2009  . Eczema   . H/O: suicide attempt   . ODD (oppositional defiant disorder)   . PTSD (post-traumatic stress disorder)   . Schizoaffective disorder ([place])   . Substance abuse [facility])     Past Surgical  History:  Procedure Laterality Date  . ADENOIDECTOMY    . TONSILLECTOMY     Family History:   Family History  Adopted: Yes  Problem Relation Age of Onset  . Mental illness Mother   . Mental illness Father   . Heart disease Maternal Grandmother   . Sickle cell trait Adoptive Mother   . Cleft palate Cousin        maternal side   Family Psychiatric  History: unknown Social History:  Social History   Substance and Sexual Activity  Alcohol Use Not Currently  . Alcohol/week: 1.0 standard drinks  . Types: 1 Cans of beer per week     Social History   Substance and Sexual Activity  Drug Use Not Currently  . Types: Marijuana, "Crack" cocaine, Heroin    Social History   Socioeconomic History  . Marital status: Single    Spouse name: Not on file  . Number of children: 1  . Years of education: Not on file  . Highest education level: Not on file  Occupational History  . Not on file  Tobacco Use  . Smoking status: Former Smoker    Packs/day: 0.75    Years: 2.00    Pack years: 1.50    Types: Cigarettes  . Smokeless tobacco: Never Used  . Tobacco comment: pt states she is quitting today  Substance and Sexual Activity  . Alcohol use: Not Currently    Alcohol/week: 1.0 standard drinks    Types: 1 Cans of beer per week  . Drug use: Not Currently    Types: Marijuana, "Crack" cocaine, Heroin  . Sexual activity: Not Currently    Partners: Male  Other Topics Concern  . Not on file  Social History Narrative   Is homeless   Her mother is her daughter's gaurdian   Multiple psychiatric hospitalizations, not on any medication currently   Substance use disorder   Social Determinants of Health   Financial Resource Strain: High Risk  . Difficulty of Paying Living Expenses: Very hard  Food Insecurity: Food Insecurity Present  . Worried About Programme researcher, broadcasting/film/video in the Last Year: Often true  . Ran Out of Food in the Last Year: Often true  Transportation Needs: No Transportation Needs  . Lack of Transportation (Medical): No  . Lack of Transportation (Non-Medical): No   Physical Activity: Sufficiently Active  . Days of Exercise per Week: 4 days  . Minutes of Exercise per Session: 40 min  Stress: Stress Concern Present  . Feeling of Stress : Very much  Social Connections: Severely Isolated  . Frequency of Communication with Friends and Family: Never  . Frequency of Social Gatherings with Friends and Family: Never  . Attends Religious Services: Never  . Active Member of Clubs or Organizations: No  . Attends Banker Meetings: Never  . Marital Status: Never married   Additional Social History:    Allergies:   Allergies  Allergen Reactions  . Banana Hives and Swelling  . Cantaloupe (Diagnostic) Hives  . Grapefruit Extract Itching and Swelling  . Albumin Human Other (See Comments)    Pt refuses all blood products.     Labs:  Results for orders placed or performed during the hospital encounter of 05/20/20 (from the past 48 hour(s))  CBC     Status: Abnormal   Collection Time: 05/20/20 12:51 PM  Result Value Ref Range   WBC 6.2 4.0 - 10.5 K/uL   RBC 3.78 (L) 3.87 - 5.11 MIL/uL  Hemoglobin 11.8 (L) 12.0 - 15.0 g/dL   HCT 69.6 29.5 - 28.4 %   MCV 97.1 80.0 - 100.0 fL   MCH 31.2 26.0 - 34.0 pg   MCHC 32.2 30.0 - 36.0 g/dL   RDW 13.2 44.0 - 10.2 %   Platelets 223 150 - 400 K/uL   nRBC 0.0 0.0 - 0.2 %    Comment: Performed at [facility], [address], [place], [place] [address]  Comprehensive metabolic panel     Status: Abnormal   Collection Time: 05/20/20 12:51 PM  Result Value Ref Range   Sodium 140 135 - 145 mmol/L   Potassium 3.5 3.5 - 5.1 mmol/L   Chloride 108 98 - 111 mmol/L   CO2 25 22 - 32 mmol/L   Glucose, Bld 83 70 - 99 mg/dL    Comment: Glucose reference range applies only to samples taken after fasting for at least 8 hours.   BUN 14 6 - 20 mg/dL   Creatinine, Ser 6.44 0.44 - 1.00 mg/dL   Calcium 8.9 8.9 - 03.4 mg/dL   Total Protein 7.2 6.5 - 8.1 g/dL   Albumin 4.0 3.5 - 5.0 g/dL   AST 30 15 - 41  U/L   ALT 18 0 - 44 U/L   Alkaline Phosphatase 43 38 - 126 U/L   Total Bilirubin 1.3 (H) 0.3 - 1.2 mg/dL   GFR calc non Af Amer >60 >60 mL/min   GFR calc Af Amer >60 >60 mL/min   Anion gap 7 5 - 15    Comment: Performed at [facility], [address] [place], [place] [address]  Acetaminophen level     Status: Abnormal   Collection Time: 05/20/20 12:51 PM  Result Value Ref Range   Acetaminophen (Tylenol), Serum <10 (L) 10 - 30 ug/mL    Comment: (NOTE) Therapeutic concentrations vary significantly. A range of 10-30 ug/mL  may be an effective concentration for many patients. However, some  are best treated at concentrations outside of this range. Acetaminophen concentrations >150 ug/mL at 4 hours after ingestion  and >50 ug/mL at 12 hours after ingestion are often associated with  toxic reactions. Performed at [facility], [address] [place], [place] [address]   Salicylate level     Status: Abnormal   Collection Time: 05/20/20 12:51 PM  Result Value Ref Range   Salicylate Lvl <7.0 (L) 7.0 - 30.0 mg/dL    Comment: Performed at [facility], [address] [place], [place] [address]  Ethanol     Status: None   Collection Time: 05/20/20 12:51 PM  Result Value Ref Range   Alcohol, Ethyl (B) <10 <10 mg/dL    Comment: (NOTE) Lowest detectable limit for serum alcohol is 10 mg/dL. For medical purposes only. Performed at [facility], [address] [place], [place] [address]     No current facility-administered medications for this encounter.   No current outpatient medications on file.    Musculoskeletal: Strength & Muscle Tone: within normal limits Gait & Station: normal Patient leans: N/A  Psychiatric Specialty Exam: Physical Exam  Nursing note and vitals reviewed. Constitutional: She is oriented to person, place, and time. She appears well-developed. She appears distressed.  HENT:  Head: Normocephalic.  Eyes: Pupils are  equal, round, and reactive to light.  Respiratory: Effort normal.  Musculoskeletal:        General: Normal range of motion.     Cervical back: Normal range of motion.  Neurological: She is alert and oriented to person, place, and time.  Skin: Skin is dry.  Psychiatric: Her mood appears anxious. Her affect is inappropriate. Her speech is tangential and slurred. Cognition and memory are impaired. She expresses impulsivity and inappropriate judgment. She expresses suicidal ideation. She is inattentive.    Review of Systems  Hematological: Positive for adenopathy.  Psychiatric/Behavioral: Positive for dysphoric mood, hallucinations, self-injury and suicidal ideas.  All other systems reviewed and are negative.   Blood pressure 103/65, pulse 71, temperature (!) 97.5 F (36.4 C), temperature source Oral, resp. rate 18, height 5\' 3"  (1.6 m), weight 54.4 kg, SpO2 100 %, unknown if currently breastfeeding.Body mass index is 21.26 kg/m.  General Appearance: Bizarre  Eye Contact:  Minimal  Speech:  Slow  Volume:  Decreased  Mood:  Dysphoric  Affect:  Constricted and Restricted  Thought Process:  Coherent and Descriptions of Associations: Tangential  Orientation:  Full (Time, Place, and Person)  Thought Content:  Illogical  Suicidal Thoughts:  Yes.  with intent/plan  Homicidal Thoughts:  No  Memory:  Recent;   Fair  Judgement:  Impaired  Insight:  Lacking  Psychomotor Activity:  Normal  Concentration:  Attention Span: Fair  Recall:  of Knowledge:  Fair  Language:  Fair  Akathisia:  NA  Handed:  Right  AIMS (if indicated):     Assets:  Resilience  ADL's:  Intact  Cognition:  Impaired,  Moderate  Sleep:        Treatment Plan Summary: Daily contact with patient to assess and evaluate symptoms and progress in treatment and Medication management -Schizoaffective disorder (HCC) crisis management, and stabilization. -Routine labs; which include CBC, CMP, UA, ETOH, Urine  pregnancy, HCG, and UDS were reviewed  -medication management: 25mg  of seroquel -Will maintain observation checks every 15 minutes for safety. -Psychosocial education regarding relapse prevention and self-care; Social and communication  -Social work will consult with family for collateral information and discuss discharge and follow up plan.  Disposition: Recommend psychiatric Inpatient admission when medically cleared. Supportive therapy provided about ongoing stressors.  [employer], NP 05/20/2020 10:19 PM

## 2020-05-20 NOTE — ED Notes (Signed)
Pt was given a sprite with ice, no lid or straw.

## 2020-05-20 NOTE — ED Notes (Signed)
Pt yelling for nurse, went to her room, pt states that she work and thought she was going to die because she hadn't eaten anything since she was given a [place] sandwich tray.   Pt's meal tray given to her.

## 2020-05-21 NOTE — ED Notes (Signed)
Hourly rounding reveals patient in room, laying in bed with eyes closed. No complaints, stable, in no acute distress. Q15 minute rounds and monitoring via Rover and Officer to continue. 

## 2020-05-21 NOTE — BH Assessment (Signed)
Patient has been accepted to [facility].  Patient assigned to: [facility]  Accepting physician is Dr. Roselyn Reef.  Call report to [phone], [phone], [phone] Representative was [place].    ER Staff is aware of it:  [facility] ER Secretary  Fanny Bien, ER MD  Amy Patient's Nurse  Patient is unable to provide family/support information at this time.   PATIENT IS SCHEDULED FOR ADMISSION ANYTIME AFTER 12pm

## 2020-05-21 NOTE — ED Notes (Signed)
RN attempted to call report (2 different phone numbers). No answer.

## 2020-05-21 NOTE — ED Notes (Signed)
IVC 

## 2020-05-21 NOTE — ED Provider Notes (Signed)
Emergency Medicine Observation Re-evaluation Note  [name], seen on rounds today.  Pt initially presented to the ED for complaints of Psychiatric Evaluation Currently, the patient is awaiting further psychiatric disposition.  Physical Exam  BP 103/65 (BP Location: Left Arm)   Pulse 71   Temp (!) 97.5 F (36.4 C) (Oral)   Resp 18   Ht 5\' 3"  (1.6 m)   Wt 54.4 kg   LMP  (LMP Unknown)   SpO2 100%   BMI 21.26 kg/m  Physical Exam  ED Course / MDM  EKG:    I have reviewed the labs performed to date as well as medications administered while in observation.   Plan  Current plan is for psychiatry reassessment and disposition . Patient is under full IVC at this time.   , MD 05/21/20 [phone]

## 2020-05-21 NOTE — ED Notes (Signed)
Hourly rounding reveals patient asleep in room. No complaints, stable, in no acute distress. Q15 minute rounds and monitoring via Rover and Officer to continue.  

## 2020-05-21 NOTE — ED Notes (Signed)
Hourly rounding reveals patient laying in bed, with eyes closed  in room. No complaints, stable, in no acute distress. Q15 minute rounds and monitoring via Rover and Officer to continue.  
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
## 2020-05-21 NOTE — ED Notes (Signed)
Hourly rounding reveals patient sleeping in room. No complaints, stable, in no acute distress. Q15 minute rounds and monitoring via Rover and Officer to continue.  
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
## 2020-05-21 NOTE — ED Notes (Signed)
Pt asleep, breakfast tray placed on sink in rm.  

## 2020-05-21 NOTE — ED Notes (Signed)
Pt discharged under IVC to [facility].  VS stable.  Unable to call report - no answer.  Belongings sent with officers.

## 2020-05-21 NOTE — BH Assessment (Signed)
Writer called [facility] and spoke with Joann (TTS AC) to inquire about open beds. She will call back to let writer know what options there are. Writer will fax patients information out to [facility], [place], [facility], Catering manager.

## 2020-05-21 NOTE — ED Notes (Signed)
Hourly rounding reveals patient laying in bed with eyes closed in room. No complaints, stable, in no acute distress. Q15 minute rounds and monitoring via Rover and Officer to continue.   

## 2020-05-21 NOTE — ED Notes (Signed)
Attempted to call report. No answer.

## 2020-05-21 NOTE — ED Notes (Addendum)
Writer faxed out patients IVC paperwork and Provider note out to   [facility] [phone] Old [phone] Timmothy Euler [phone]

## 2020-06-01 ENCOUNTER — Emergency Department
Admission: EM | Admit: 2020-06-01 | Discharge: 2020-06-02 | Disposition: A | Discharge status: Other Acute Inpatient Hospital | Payer: Medicaid Other | Attending: Emergency Medicine | Admitting: Emergency Medicine

## 2020-06-01 ENCOUNTER — Encounter: Payer: Self-pay | Admitting: Emergency Medicine

## 2020-06-01 DIAGNOSIS — Z590 Homelessness: Secondary | ICD-10-CM | POA: Insufficient documentation

## 2020-06-01 DIAGNOSIS — F1420 Cocaine dependence, uncomplicated: Secondary | ICD-10-CM | POA: Insufficient documentation

## 2020-06-01 DIAGNOSIS — Z20822 Contact with and (suspected) exposure to covid-19: Secondary | ICD-10-CM | POA: Insufficient documentation

## 2020-06-01 DIAGNOSIS — Z87891 Personal history of nicotine dependence: Secondary | ICD-10-CM | POA: Diagnosis not present

## 2020-06-01 DIAGNOSIS — J45909 Unspecified asthma, uncomplicated: Secondary | ICD-10-CM | POA: Diagnosis not present

## 2020-06-01 DIAGNOSIS — F1910 Other psychoactive substance abuse, uncomplicated: Secondary | ICD-10-CM | POA: Insufficient documentation

## 2020-06-01 DIAGNOSIS — F29 Unspecified psychosis not due to a substance or known physiological condition: Secondary | ICD-10-CM | POA: Diagnosis present

## 2020-06-01 DIAGNOSIS — F1490 Cocaine use, unspecified, uncomplicated: Secondary | ICD-10-CM

## 2020-06-01 DIAGNOSIS — F259 Schizoaffective disorder, unspecified: Secondary | ICD-10-CM | POA: Diagnosis not present

## 2020-06-01 LAB — URINALYSIS, COMPLETE (UACMP) WITH MICROSCOPIC
Bacteria, UA: NONE SEEN
Bilirubin Urine: NEGATIVE
Glucose, UA: NEGATIVE mg/dL
Hgb urine dipstick: NEGATIVE
Ketones, ur: 5 mg/dL — AB
Leukocytes,Ua: NEGATIVE
Nitrite: NEGATIVE
Protein, ur: 100 mg/dL — AB
Specific Gravity, Urine: 1.027 (ref 1.005–1.030)
pH: 5 (ref 5.0–8.0)

## 2020-06-01 LAB — COMPREHENSIVE METABOLIC PANEL
ALT: 17 U/L (ref 0–44)
AST: 29 U/L (ref 15–41)
Albumin: 4.3 g/dL (ref 3.5–5.0)
Alkaline Phosphatase: 45 U/L (ref 38–126)
Anion gap: 12 (ref 5–15)
BUN: 24 mg/dL — ABNORMAL HIGH (ref 6–20)
CO2: 26 mmol/L (ref 22–32)
Calcium: 9.2 mg/dL (ref 8.9–10.3)
Chloride: 108 mmol/L (ref 98–111)
Creatinine, Ser: 1.04 mg/dL — ABNORMAL HIGH (ref 0.44–1.00)
GFR calc Af Amer: >60 mL/min (ref >60)
GFR calc non Af Amer: >60 mL/min (ref >60)
Glucose, Bld: 70 mg/dL (ref 70–99)
Potassium: 3.3 mmol/L — ABNORMAL LOW (ref 3.5–5.1)
Sodium: 146 mmol/L — ABNORMAL HIGH (ref 135–145)
Total Bilirubin: 1.4 mg/dL — ABNORMAL HIGH (ref 0.3–1.2)
Total Protein: 7.6 g/dL (ref 6.5–8.1)

## 2020-06-01 LAB — CBC WITH DIFFERENTIAL/PLATELET
Abs Immature Granulocytes: 0.05 10*3/uL (ref 0.00–0.07)
Basophils Absolute: 0.1 10*3/uL (ref 0.0–0.1)
Basophils Relative: 0 %
Eosinophils Absolute: 0 10*3/uL (ref 0.0–0.5)
Eosinophils Relative: 0 %
HCT: 37.1 % (ref 36.0–46.0)
Hemoglobin: 12.1 g/dL (ref 12.0–15.0)
Immature Granulocytes: 0 %
Lymphocytes Relative: 9 %
Lymphs Abs: 1.2 10*3/uL (ref 0.7–4.0)
MCH: 31.3 pg (ref 26.0–34.0)
MCHC: 32.6 g/dL (ref 30.0–36.0)
MCV: 96.1 fL (ref 80.0–100.0)
Monocytes Absolute: 0.9 10*3/uL (ref 0.1–1.0)
Monocytes Relative: 7 %
Neutro Abs: 11.2 10*3/uL — ABNORMAL HIGH (ref 1.7–7.7)
Neutrophils Relative %: 84 %
Platelets: 225 10*3/uL (ref 150–400)
RBC: 3.86 MIL/uL — ABNORMAL LOW (ref 3.87–5.11)
RDW: 12.9 % (ref 11.5–15.5)
WBC: 13.4 10*3/uL — ABNORMAL HIGH (ref 4.0–10.5)
nRBC: 0 % (ref 0.0–0.2)

## 2020-06-01 LAB — URINE DRUG SCREEN, QUALITATIVE (ARMC ONLY)
Amphetamines, Ur Screen: NOT DETECTED
Barbiturates, Ur Screen: NOT DETECTED
Benzodiazepine, Ur Scrn: NOT DETECTED
Cannabinoid 50 Ng, Ur ~~LOC~~: NOT DETECTED
Cocaine Metabolite,Ur ~~LOC~~: POSITIVE — AB
MDMA (Ecstasy)Ur Screen: NOT DETECTED
Methadone Scn, Ur: NOT DETECTED
Opiate, Ur Screen: NOT DETECTED
Phencyclidine (PCP) Ur S: NOT DETECTED
Tricyclic, Ur Screen: NOT DETECTED

## 2020-06-01 LAB — ACETAMINOPHEN LEVEL: Acetaminophen (Tylenol), Serum: <10 ug/mL — ABNORMAL LOW (ref 10–30)

## 2020-06-01 LAB — ETHANOL: Alcohol, Ethyl (B): <10 mg/dL (ref <10)

## 2020-06-01 LAB — SARS CORONAVIRUS 2 BY RT PCR (HOSPITAL ORDER, PERFORMED IN ~~LOC~~ HOSPITAL LAB): SARS Coronavirus 2: NEGATIVE

## 2020-06-01 LAB — SALICYLATE LEVEL: Salicylate Lvl: <7 mg/dL — ABNORMAL LOW (ref 7.0–30.0)

## 2020-06-01 LAB — POCT PREGNANCY, URINE: Preg Test, Ur: NEGATIVE

## 2020-06-01 MED ORDER — ACETAMINOPHEN 325 MG PO TABS
650.0000 mg | ORAL_TABLET | Freq: Once | ORAL | Status: AC
  Administered 2020-06-01: 650 mg via ORAL

## 2020-06-01 MED ORDER — ACETAMINOPHEN 325 MG PO TABS
ORAL_TABLET | ORAL | Status: AC
  Filled 2020-06-01: qty 2

## 2020-06-01 MED ORDER — BENZTROPINE MESYLATE 1 MG PO TABS
0.5000 mg | ORAL_TABLET | Freq: Two times a day (BID) | ORAL | Status: DC
  Administered 2020-06-01 – 2020-06-02 (×3): 0.5 mg via ORAL
  Filled 2020-06-01 (×4): qty 1

## 2020-06-01 MED ORDER — DIAZEPAM 2 MG PO TABS
2.0000 mg | ORAL_TABLET | Freq: Two times a day (BID) | ORAL | Status: DC
  Administered 2020-06-01 – 2020-06-02 (×2): 2 mg via ORAL
  Filled 2020-06-01 (×2): qty 1

## 2020-06-01 MED ORDER — THIOTHIXENE 2 MG PO CAPS
2.0000 mg | ORAL_CAPSULE | Freq: Two times a day (BID) | ORAL | Status: DC
  Administered 2020-06-01 – 2020-06-02 (×2): 2 mg via ORAL
  Filled 2020-06-01 (×5): qty 1

## 2020-06-01 MED ORDER — DULOXETINE HCL 20 MG PO CPEP
20.0000 mg | ORAL_CAPSULE | Freq: Every day | ORAL | Status: DC
  Administered 2020-06-02: 20 mg via ORAL
  Filled 2020-06-01 (×4): qty 1

## 2020-06-01 NOTE — ED Notes (Signed)
In room to scan pts armband and pt is refusing her meds except cogentin.

## 2020-06-01 NOTE — Consult Note (Signed)
[facility] Face-to-Face Psychiatry Consult   Reason for Consult:  Psychotic episode   Referring Physician: ED MD  Patient Identification: Bridget Carpenter MRN:  [id number] Principal Diagnosis: <principal problem not specified> Diagnosis:   Schizoaffective Disorder Past polysubstance dependence   Total Time spent with patient: 45 minutes   Subjective:  I cannot see  And I am upset    Bridget Carpenter is a 29 y.o. female patient admitted with  Exacerbation of psychosis, mood and anxiety  She again has been living on streets and says that she is " deaf and blind" - She actually has negative symptoms and keeps eyes squinted ---  Previous optho exams negative.   She is  Here for voluntary admission as she has stopped her medications.   HPI:  See above   Past Psychiatric History:  Multiple admits and noncompliance problems   And polysubstance dependence      Risk to Self:   clinical deterioration risk if discharged Risk to Others:   none Prior Inpatient Therapy:  multiple in past  Prior Outpatient Therapy:  none recently remains homeless   Past Medical History:  Past Medical History:  Diagnosis Date   Asthma    Depression 2009   Inpatient psych admission for SI, dissociative fugue   Dissociative disorder or reaction 2009   Eczema    H/O: suicide attempt    ODD (oppositional defiant disorder)    PTSD (post-traumatic stress disorder)    Schizoaffective disorder (HCC)    Substance abuse (HCC)     Past Surgical History:  Procedure Laterality Date   ADENOIDECTOMY     TONSILLECTOMY     Family History:  Family History  Adopted: Yes  Problem Relation Age of Onset   Mental illness Mother    Mental illness Father    Heart disease Maternal Grandmother    Sickle cell trait Adoptive Mother    Cleft palate Cousin        maternal side   Family Psychiatric  History:  Not known she is too ill and unreliable Social History:   Currently homeless, unclear  next of kin and relatives.   Social History   Substance and Sexual Activity  Alcohol Use Not Currently   Alcohol/week: 1.0 standard drinks   Types: 1 Cans of beer per week     Social History   Substance and Sexual Activity  Drug Use Not Currently   Types: Marijuana, "Crack" cocaine, Heroin    Social History   Socioeconomic History   Marital status: Single    Spouse name: Not on file   Number of children: 1   Years of education: Not on file   Highest education level: Not on file  Occupational History   Not on file  Tobacco Use   Smoking status: Former Smoker    Packs/day: 0.75    Years: 2.00    Pack years: 1.50    Types: Cigarettes   Smokeless tobacco: Never Used   Tobacco comment: pt states she is quitting today  Substance and Sexual Activity   Alcohol use: Not Currently    Alcohol/week: 1.0 standard drinks    Types: 1 Cans of beer per week   Drug use: Not Currently    Types: Marijuana, "Crack" cocaine, Heroin   Sexual activity: Not Currently    Partners: Male  Other Topics Concern   Not on file  Social History Narrative   Is homeless   Her mother is her daughter's gaurdian   Multiple  psychiatric hospitalizations, not on any medication currently   Substance use disorder   Social Determinants of Health   Financial Resource Strain: High Risk   Difficulty of Paying Living Expenses: Very hard  Food Insecurity: Food Insecurity Present   Worried About Charity fundraiser in the Last Year: Often true   Arboriculturist in the Last Year: Often true  Transportation Needs: No Transportation Needs   Lack of Transportation (Medical): No   Lack of Transportation (Non-Medical): No  Physical Activity: Sufficiently Active   Days of Exercise per Week: 4 days   Minutes of Exercise per Session: 40 min  Stress: Stress Concern Present   Feeling of Stress : Very much  Social Connections: Severely Isolated   Frequency of Communication with Friends and  Family: Never   Frequency of Social Gatherings with Friends and Family: Never   Attends Religious Services: Never   Marine scientist or Organizations: No   Attends Archivist Meetings: Never   Marital Status: Never married   Additional Social History:    Allergies:   Allergies  Allergen Reactions   Banana Hives and Swelling   Cantaloupe (Diagnostic) Hives   Grapefruit Extract Itching and Swelling   Albumin Human Other (See Comments)    Pt refuses all blood products.     Labs:  Results for orders placed or performed during the hospital encounter of 06/01/20 (from the past 48 hour(s))  CBC with Differential     Status: Abnormal   Collection Time: 06/01/20  1:09 AM  Result Value Ref Range   WBC 13.4 (H) 4.0 - 10.5 K/uL   RBC 3.86 (L) 3.87 - 5.11 MIL/uL   Hemoglobin 12.1 12.0 - 15.0 g/dL   HCT 37.1 36.0 - 46.0 %   MCV 96.1 80.0 - 100.0 fL   MCH 31.3 26.0 - 34.0 pg   MCHC 32.6 30.0 - 36.0 g/dL   RDW 12.9 11.5 - 15.5 %   Platelets 225 150 - 400 K/uL   nRBC 0.0 0.0 - 0.2 %   Neutrophils Relative % 84 %   Neutro Abs 11.2 (H) 1.7 - 7.7 K/uL   Lymphocytes Relative 9 %   Lymphs Abs 1.2 0.7 - 4.0 K/uL   Monocytes Relative 7 %   Monocytes Absolute 0.9 0.1 - 1.0 K/uL   Eosinophils Relative 0 %   Eosinophils Absolute 0.0 0.0 - 0.5 K/uL   Basophils Relative 0 %   Basophils Absolute 0.1 0.0 - 0.1 K/uL   Immature Granulocytes 0 %   Abs Immature Granulocytes 0.05 0.00 - 0.07 K/uL    Comment: Performed at [facility], [address] [place], [place] [address]  Comprehensive metabolic panel     Status: Abnormal   Collection Time: 06/01/20  1:09 AM  Result Value Ref Range   Sodium 146 (H) 135 - 145 mmol/L   Potassium 3.3 (L) 3.5 - 5.1 mmol/L   Chloride 108 98 - 111 mmol/L   CO2 26 22 - 32 mmol/L   Glucose, Bld 70 70 - 99 mg/dL    Comment: Glucose reference range applies only to samples taken after fasting for at least 8 hours.   BUN 24 (H) 6 -  20 mg/dL   Creatinine, Ser 1.04 (H) 0.44 - 1.00 mg/dL   Calcium 9.2 8.9 - 10.3 mg/dL   Total Protein 7.6 6.5 - 8.1 g/dL   Albumin 4.3 3.5 - 5.0 g/dL   AST 29 15 - 41  U/L   ALT 17 0 - 44 U/L   Alkaline Phosphatase 45 38 - 126 U/L   Total Bilirubin 1.4 (H) 0.3 - 1.2 mg/dL   GFR calc non Af Amer >60 >60 mL/min   GFR calc Af Amer >60 >60 mL/min   Anion gap 12 5 - 15    Comment: Performed at [facility], [address] [place], [place] [address]  Ethanol     Status: None   Collection Time: 06/01/20  1:09 AM  Result Value Ref Range   Alcohol, Ethyl (B) <10 <10 mg/dL    Comment: (NOTE) Lowest detectable limit for serum alcohol is 10 mg/dL. For medical purposes only. Performed at [facility], [address] [place], [place] [address]   Acetaminophen level     Status: Abnormal   Collection Time: 06/01/20  1:09 AM  Result Value Ref Range   Acetaminophen (Tylenol), Serum <10 (L) 10 - 30 ug/mL    Comment: (NOTE) Therapeutic concentrations vary significantly. A range of 10-30 ug/mL  may be an effective concentration for many patients. However, some  are best treated at concentrations outside of this range. Acetaminophen concentrations >150 ug/mL at 4 hours after ingestion  and >50 ug/mL at 12 hours after ingestion are often associated with  toxic reactions. Performed at [facility], [address] [place], [place] [address]   Salicylate level     Status: Abnormal   Collection Time: 06/01/20  1:09 AM  Result Value Ref Range   Salicylate Lvl <7.0 (L) 7.0 - 30.0 mg/dL    Comment: Performed at [facility], [address] [place], [place] [address]  Urinalysis, Complete w Microscopic     Status: Abnormal   Collection Time: 06/01/20  1:22 AM  Result Value Ref Range   Color, Urine YELLOW (A) YELLOW   APPearance HAZY (A) CLEAR   Specific Gravity, Urine 1.027 1.005 - 1.030   pH 5.0 5.0 - 8.0   Glucose, UA NEGATIVE NEGATIVE mg/dL   Hgb urine  dipstick NEGATIVE NEGATIVE   Bilirubin Urine NEGATIVE NEGATIVE   Ketones, ur 5 (A) NEGATIVE mg/dL   Protein, ur 833 (A) NEGATIVE mg/dL   Nitrite NEGATIVE NEGATIVE   Leukocytes,Ua NEGATIVE NEGATIVE   RBC / HPF 0-5 0 - 5 RBC/hpf   WBC, UA 0-5 0 - 5 WBC/hpf   Bacteria, UA NONE SEEN NONE SEEN   Squamous Epithelial / LPF 0-5 0 - 5   Mucus PRESENT    Hyaline Casts, UA PRESENT     Comment: Performed at [facility], [address] [place], [place] [address]  Urine Drug Screen, Qualitative     Status: Abnormal   Collection Time: 06/01/20  1:22 AM  Result Value Ref Range   Tricyclic, Ur Screen NONE DETECTED NONE DETECTED   Amphetamines, Ur Screen NONE DETECTED NONE DETECTED   MDMA (Ecstasy)Ur Screen NONE DETECTED NONE DETECTED   Cocaine Metabolite,Ur [place] POSITIVE (A) NONE DETECTED   Opiate, Ur Screen NONE DETECTED NONE DETECTED   Phencyclidine (PCP) Ur S NONE DETECTED NONE DETECTED   Cannabinoid 50 Ng, Ur [place] NONE DETECTED NONE DETECTED   Barbiturates, Ur Screen NONE DETECTED NONE DETECTED   Benzodiazepine, Ur Scrn NONE DETECTED NONE DETECTED   Methadone Scn, Ur NONE DETECTED NONE DETECTED    Comment: (NOTE) Tricyclics + metabolites, urine    Cutoff 1000 ng/mL Amphetamines + metabolites, urine  Cutoff 1000 ng/mL MDMA (Ecstasy), urine  Cutoff 500 ng/mL Cocaine Metabolite, urine          Cutoff 300 ng/mL Opiate + metabolites, urine        Cutoff 300 ng/mL Phencyclidine (PCP), urine         Cutoff 25 ng/mL Cannabinoid, urine                 Cutoff 50 ng/mL Barbiturates + metabolites, urine  Cutoff 200 ng/mL Benzodiazepine, urine              Cutoff 200 ng/mL Methadone, urine                   Cutoff 300 ng/mL The urine drug screen provides only a preliminary, unconfirmed analytical test result and should not be used for non-medical purposes. Clinical consideration and professional judgment should be applied to any positive drug screen result due to  possible interfering substances. A more specific alternate chemical method must be used in order to obtain a confirmed analytical result. Gas chromatography / mass spectrometry (GC/MS) is the preferred confirmat ory method. Performed at [facility], [address] [place] [id number]   Pregnancy, urine POC     Status: None   Collection Time: 06/01/20  1:27 AM  Result Value Ref Range   Preg Test, Ur NEGATIVE NEGATIVE    Comment:        THE SENSITIVITY OF THIS METHODOLOGY IS >24 mIU/mL     Current Facility-Administered Medications  Medication Dose Route Frequency Provider Last Rate Last Admin   diazepam (VALIUM) tablet 2 mg  2 mg Oral BID AC & HS Roselind Messierao, Muath Hallam, MD       DULoxetine (CYMBALTA) DR capsule 20 mg  20 mg Oral Daily Roselind Messierao, Deeksha Cotrell, MD       thiothixene (NAVANE) capsule 2 mg  2 mg Oral BID Roselind Messierao, Merril Isakson, MD       No current outpatient medications on file.    Musculoskeletal: Strength & Muscle Tone: eyes are tense and squinted negative symptoms  Gait & Station: hesitant due to negative symptoms  Patient leans:   Psychiatric Specialty Exam: Physical Exam  Review of Systems   8 systems reviewed are negative   Blood pressure 112/67, pulse 78, temperature 98.3 F (36.8 C), temperature source Oral, resp. rate 18, SpO2 98 %, unknown if currently breastfeeding.There is no height or weight on file to calculate BMI.    Mental Status     Strange odd eccentric Paranoid fearful  Appearance disheveled  Rapport --vague, eyes are squinted  Negativistic vague Mood depressed and anxious Affect strange and depressed and anxious Do not see movement disordered problems  Thought process--vague, illogical Thought content ---delusional, paranoid internal distraction unclear on voices  SI and HI --not endorsing  Fund of knowledge and intelligence ---below average Concentration and attention diminished Consciousness not fluctuant or clouded Memory not  able to assess she is not cooperative  Abstraction concrete Judgement insight reliability all poor  Speech strange odd -normal rate and tone                                                    Treatment Plan Summary:  Patient with C 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  Has need for stabilization in very odd and strange state with negativistic behaviors  Disposition:   Inpatient admission  Patient started on  Valium 2 mg po bid for severe anxiety  Muscle tension from negative symptoms   Navane 2 mg po bid Cogentin 0.5 mg po bid  And Cymbalta 20 mg po qam    Most likely can go to our own Behav unit later today --awaiting bed acceptance     Roselind Messier, MD 06/01/2020 1:44 PM

## 2020-06-01 NOTE — ED Notes (Signed)
Pt given drink and sandwich tray. 

## 2020-06-01 NOTE — ED Notes (Signed)
Pt refused VS  

## 2020-06-01 NOTE — BH Assessment (Signed)
Referral information for Psychiatric Hospitalization faxed to;    [facility] [phone]- [phone]),    [facility] [phone]),    [place] [phone], [phone], [phone] or [phone]),    [facility] [phone]),    [facility] [phone] -or- [phone]),    [place] [phone]),    [facility] [phone])

## 2020-06-01 NOTE — ED Notes (Signed)
Writer called patients RN Janus Molder) and inquired about patients current clinical presentation. It was reported that patient is sleep. Writer will pass on to day shift to reassess.

## 2020-06-01 NOTE — ED Notes (Signed)
Pt called this tech in to room to ask to see the doctor because she felt "like glass was sticking all in her body" RN notified

## 2020-06-01 NOTE — ED Notes (Signed)
Pt changed into hospital scrubs. Personal belongings placed into bag and labeled.    1 gray pant 1 pink and purple shirt 1 pink bra 1 gray purse

## 2020-06-01 NOTE — ED Notes (Signed)
Pt complaining of hand pain and blistering "all over" her hands.  Pt stated it happened overnight. This Clinical research associate noticed a couple of blisters on pt's left hand.    RN will make EDP aware.

## 2020-06-01 NOTE — ED Provider Notes (Signed)
[facility] Emergency Department Provider Note   ____________________________________________   First MD Initiated Contact with Patient 06/01/20 0104     (approximate)  I have reviewed the triage vital signs and the nursing notes.   HISTORY  Chief Complaint Psychosis   HPI Bridget Carpenter is a 29 y.o. female brought to the ED via EMS with a chief complaint of psychosis.  Patient states she threw herself out of the moving vehicle and went to the fire department where she told them she was deaf and blind and hurting all over.  When asked how she found the fire house in the dark, patient states "God led me".  Says her "head is not right" and appears paranoid "I cannot see them but I know they are trying to kill me".    Past Medical History:  Diagnosis Date  . Asthma   . Depression 2009   Inpatient psych admission for SI, dissociative fugue  . Dissociative disorder or reaction 2009  . Eczema   . H/O: suicide attempt   . ODD (oppositional defiant disorder)   . PTSD (post-traumatic stress disorder)   . Schizoaffective disorder ([place])   . Substance abuse [facility])     Patient Active Problem List   Diagnosis Date Noted  . Adjustment disorder   . MDD (major depressive disorder), recurrent episode, severe ([place]) 08/31/2019  . Indication for care in labor and delivery, antepartum 08/29/2019  . Hydrops fetalis 08/29/2019  . Indication for care in labor or delivery 08/14/2019  . Dehydration 08/14/2019  . Rubella non-immune status, antepartum 08/01/2019  . Pain of round ligament affecting pregnancy, antepartum 08/01/2019  . Labor and delivery indication for care or intervention 07/30/2019  . Homelessness 07/25/2019  . Decreased fetal movement 07/02/2019  . Club foot, fetal, affecting care of mother, antepartum 06/29/2019  . Supervision of high risk pregnancy, antepartum 06/19/2019  . Cocaine abuse with cocaine-induced mood disorder ([place]) 04/15/2019  .  Psychoactive substance-induced psychosis ([place]) 05/03/2018  . Bacterial vaginosis 12/25/2017  . PTSD (post-traumatic stress disorder) 09/02/2017  . Schizoaffective disorder ([place]) 09/01/2017  . MRSA carrier 07/18/2017  . Overdose 07/16/2017  . Aspiration pneumonia ([place]) 07/16/2017  . Acute respiratory failure ([place]) 07/16/2017  . Polysubstance abuse ([place]) 07/16/2017  . Eczema 05/08/2017  . Borderline personality disorder ([place]) 11/06/2016  . Cocaine use disorder, severe, dependence ([place]) 11/06/2016  . Cannabis use disorder, moderate, dependence ([place]) 11/06/2016  . Alcohol use disorder, mild, abuse 11/06/2016  . Asthma 09/23/2011  . Tobacco use disorder 09/15/2009    Past Surgical History:  Procedure Laterality Date  . ADENOIDECTOMY    . TONSILLECTOMY      Prior to Admission medications   Not on File    Allergies Banana, Cantaloupe (diagnostic), Grapefruit extract, and Albumin human  Family History  Adopted: Yes  Problem Relation Age of Onset  . Mental illness Mother   . Mental illness Father   . Heart disease Maternal Grandmother   . Sickle cell trait Adoptive Mother   . Cleft palate Cousin        maternal side    Social History Social History   Tobacco Use  . Smoking status: Former Smoker    Packs/day: 0.75    Years: 2.00    Pack years: 1.50    Types: Cigarettes  . Smokeless tobacco: Never Used  . Tobacco comment: pt states she is quitting today  Substance Use Topics  . Alcohol use: Not Currently    Alcohol/week:  1.0 standard drinks    Types: 1 Cans of beer per week  . Drug use: Not Currently    Types: Marijuana, "Crack" cocaine, Heroin    Review of Systems  Constitutional: No fever/chills Eyes: No visual changes. ENT: No sore throat. Cardiovascular: Denies chest pain. Respiratory: Denies shortness of breath. Gastrointestinal: No abdominal pain.  No nausea, no vomiting.  No diarrhea.  No constipation. Genitourinary: Negative for  dysuria. Musculoskeletal: Negative for back pain. Skin: Negative for rash. Neurological: Negative for headaches, focal weakness or numbness. Psychiatric:  Positive for psychosis.  ____________________________________________   PHYSICAL EXAM:  VITAL SIGNS: ED Triage Vitals [06/01/20 0102]  Enc Vitals Group     BP 112/67     Pulse Rate 78     Resp 18     Temp 98.3 F (36.8 C)     Temp Source Oral     SpO2 98 %     Weight      Height      Head Circumference      Peak Flow      Pain Score      Pain Loc      Pain Edu?      Excl. in GC?     Constitutional: Alert and oriented. Well appearing and in no acute distress.  Able to walk from EMS stretcher into the room and onto her bed without assistance.  Speaking without difficulty. Eyes: Conjunctivae are normal. PERRL. EOMI. Head: Atraumatic. Nose: Atraumatic. Mouth/Throat: Mucous membranes are moist.  No dental malocclusion. Neck: No stridor.  No cervical spine tenderness to palpation. Cardiovascular: Normal rate, regular rhythm. Grossly normal heart sounds.  Good peripheral circulation. Respiratory: Normal respiratory effort.  No retractions. Lungs CTAB. Gastrointestinal: Soft and nontender to light or deep palpation. No distention. No abdominal bruits. No CVA tenderness. Musculoskeletal: No lower extremity tenderness nor edema.  No joint effusions. Neurologic:  Normal speech and language. No gross focal neurologic deficits are appreciated. No gait instability. Skin:  Skin is warm, dry and intact. No rash noted. Psychiatric: Mood and affect are flat. Speech and behavior are flat.  ____________________________________________   LABS (all labs ordered are listed, but only abnormal results are displayed)  Labs Reviewed  CBC WITH DIFFERENTIAL/PLATELET - Abnormal; Notable for the following components:      Result Value   WBC 13.4 (*)    RBC 3.86 (*)    Neutro Abs 11.2 (*)    All other components within normal limits   COMPREHENSIVE METABOLIC PANEL - Abnormal; Notable for the following components:   Sodium 146 (*)    Potassium 3.3 (*)    BUN 24 (*)    Creatinine, Ser 1.04 (*)    Total Bilirubin 1.4 (*)    All other components within normal limits  ACETAMINOPHEN LEVEL - Abnormal; Notable for the following components:   Acetaminophen (Tylenol), Serum <10 (*)    All other components within normal limits  SALICYLATE LEVEL - Abnormal; Notable for the following components:   Salicylate Lvl <7.0 (*)    All other components within normal limits  URINALYSIS, COMPLETE (UACMP) WITH MICROSCOPIC - Abnormal; Notable for the following components:   Color, Urine YELLOW (*)    APPearance HAZY (*)    Ketones, ur 5 (*)    Protein, ur 100 (*)    All other components within normal limits  URINE DRUG SCREEN, QUALITATIVE ([facility] ONLY) - Abnormal; Notable for the following components:   Cocaine Metabolite,Ur [place] POSITIVE (*)  All other components within normal limits  ETHANOL  POC URINE PREG, ED  POCT PREGNANCY, URINE   ____________________________________________  EKG  None ____________________________________________  RADIOLOGY  ED MD interpretation: None  Official radiology report(s): No results found.  ____________________________________________   PROCEDURES  Procedure(s) performed (including Critical Care):  Procedures   ____________________________________________   INITIAL IMPRESSION / ASSESSMENT AND PLAN / ED COURSE  As part of my medical decision making, I reviewed the following data within the electronic MEDICAL RECORD NUMBER Nursing notes reviewed and incorporated, Labs reviewed, Old chart reviewed, A consult was requested and obtained from this/these consultant(s) Psychiatry and Notes from prior ED visits     Bridget Carpenter was evaluated in Emergency Department on 06/01/2020 for the symptoms described in the history of present illness. She was evaluated in the context of the global  COVID-19 pandemic, which necessitated consideration that the patient might be at risk for infection with the SARS-CoV-2 virus that causes COVID-19. Institutional protocols and algorithms that pertain to the evaluation of patients at risk for COVID-19 are in a state of rapid change based on information released by regulatory bodies including the CDC and federal and state organizations. These policies and algorithms were followed during the patient's care in the ED.    29 year old female with schizoaffective disorder presenting with psychosis.  Contracts for safety while in the emergency department.  We will keep her voluntary pending psychiatric evaluation and disposition.   Clinical Course as of Jun 01 210  Fri Jun 01, 2020  0155 The patient has been placed in psychiatric observation due to the need to provide a safe environment for the patient while obtaining psychiatric consultation and evaluation, as well as ongoing medical and medication management to treat the patient's condition. The patient has not been placed under full IVC at this time.     [JS]  0159 Psychiatric NP evaluated patient in the ED; unable to get much history.  Will keep patient in the ED overnight for reassessment in the morning.   [JS]    Clinical Course User Index [JS] Irean Hong, MD     ____________________________________________   FINAL CLINICAL IMPRESSION(S) / ED DIAGNOSES  Final diagnoses:  Schizoaffective disorder, unspecified type (HCC)  Cocaine use     ED Discharge Orders    None       Note:  This document was prepared using Dragon voice recognition software and may include unintentional dictation errors.   Irean Hong, MD 06/01/20 [phone]

## 2020-06-01 NOTE — ED Notes (Signed)
During initial assessment of pt she is noted to have a plastic pink and white choker necklace around her neck which has a silver colored charm on it. Asked pt to let this RN have so it could be locked up and she willing gave to this RN. Pt also noted to have a ring on her left index finger which she is unable to remove due to will not come over her knuckle. Pt continues to endorse that she is blind and that "I don't hear well". Necklace placed in specimen cup and place in pt belongings.

## 2020-06-01 NOTE — ED Notes (Signed)
VOL/PENDING PSYCH CONSULT °

## 2020-06-01 NOTE — ED Notes (Signed)
Pt has taken a shower.  

## 2020-06-01 NOTE — ED Notes (Signed)
Pt will be moved to [facility].

## 2020-06-01 NOTE — ED Provider Notes (Signed)
Patient reports she cannot see but she can fax like she can then she reports she has got some bruises all over her arms and legs and hands which she does not.  She is to come in voluntarily but she says she cannot see to sign the consent because she cannot care for herself currently I will IVC her.  I just got done speaking with her about this and looking her arms and hands.   Arnaldo Natal, MD 06/01/20 [phone]

## 2020-06-01 NOTE — BH Assessment (Addendum)
Patient was unable to participate in assessment. Patient will need to be reassessed on day shift. Patient should continue to be observed over night.   Case was staffed with Jackie, NP.  

## 2020-06-01 NOTE — ED Triage Notes (Signed)
Pt arrived via EMS from local firehouse where pt walked to and stated that she was deaf and blind with pain all over. Pt arrived to ED with wet clothing, no shoes. Pt reports that she jumped out of car and that her head isn't right. Pt sts she is unable to see at all and that she cant hear well. Pt sts, "I know they are trying to kill me. I cant see them but I know they are trying to kill me." Pt denies HI and SI at this time. Pt is calm and cooperative.

## 2020-06-01 NOTE — ED Notes (Signed)
Pt got up by herself and walked to her room door. This tech asked what she was doing due to her saying she is blind, pt stated she needed to use the restroom, this tech walked by her to the rest room in case but did not assist much. Told pt we need to get her non slip socks on if she is going to be walking but pt refused. After using bathroom pt made her way to the sanitizer on the wall and proceeded to rub all over face, informed pt that was not good for her face and she should not do that but pt still continued. Pt seems to open eyes to see what direction she has to walk in then closes eyes and walks toward destination.

## 2020-06-02 NOTE — ED Notes (Signed)
Patient has been accepted top [facility], may go after 8 am. Spoke with [employer]

## 2020-06-02 NOTE — BH Assessment (Signed)
PATIENT BED AVAILABLE AFTER 8AM ON 06/02/20  Patient has been accepted to [facility].  Patient assigned to [facility] Accepting physician is Dr. Thomas Cornwall.  Call report to [phone].  Representative was Pax.   ER Staff is aware of it:  Dave ER Secretary  Dr. Stafford, ER MD  Ann Patient's Nurse     Address: [address] [place], [place] [address] 

## 2020-06-02 NOTE — ED Provider Notes (Signed)
Emergency Medicine Observation Re-evaluation Note  [name], seen on rounds today.  Pt initially presented to the ED for complaints of Mental Health Problem Currently, the patient is calm, comfortable, NAD.  Physical Exam  BP (!) 107/59 (BP Location: Left Arm)   Pulse 72   Temp 98.1 F (36.7 C) (Oral)   Resp 20   LMP  (LMP Unknown)   SpO2 100%  Physical Exam  ED Course / MDM  EKG:  Clinical Course as of Jun 02 516  Fri Jun 01, 2020  0155 The patient has been placed in psychiatric observation due to the need to provide a safe environment for the patient while obtaining psychiatric consultation and evaluation, as well as ongoing medical and medication management to treat the patient's condition. The patient has not been placed under full IVC at this time.     [JS]  0159 Psychiatric NP evaluated patient in the ED; unable to get much history.  Will keep patient in the ED overnight for reassessment in the morning.   [JS]    Clinical Course User Index [JS] Irean Hong, MD   I have reviewed the labs performed to date as well as medications administered while in observation.  Recent changes in the last 24 hours include no acute events. Psych eval underway. Plan  Current plan is for continued psychiatry evaluation. Patient is under full IVC at this time.   Sharman Cheek, MD 06/02/20 [phone]

## 2020-06-24 ENCOUNTER — Emergency Department: Payer: Medicaid Other

## 2020-06-24 ENCOUNTER — Emergency Department
Admission: EM | Admit: 2020-06-24 | Discharge: 2020-06-24 | Disposition: A | Discharge status: Home / Self Care | Payer: Medicaid Other | Attending: Emergency Medicine | Admitting: Emergency Medicine

## 2020-06-24 ENCOUNTER — Other Ambulatory Visit: Payer: Self-pay

## 2020-06-24 DIAGNOSIS — F17200 Nicotine dependence, unspecified, uncomplicated: Secondary | ICD-10-CM | POA: Diagnosis present

## 2020-06-24 DIAGNOSIS — Z87891 Personal history of nicotine dependence: Secondary | ICD-10-CM | POA: Insufficient documentation

## 2020-06-24 DIAGNOSIS — F1910 Other psychoactive substance abuse, uncomplicated: Secondary | ICD-10-CM | POA: Diagnosis present

## 2020-06-24 DIAGNOSIS — R0602 Shortness of breath: Secondary | ICD-10-CM | POA: Diagnosis present

## 2020-06-24 DIAGNOSIS — J45909 Unspecified asthma, uncomplicated: Secondary | ICD-10-CM | POA: Diagnosis not present

## 2020-06-24 DIAGNOSIS — J690 Pneumonitis due to inhalation of food and vomit: Secondary | ICD-10-CM | POA: Diagnosis present

## 2020-06-24 DIAGNOSIS — F332 Major depressive disorder, recurrent severe without psychotic features: Secondary | ICD-10-CM | POA: Diagnosis present

## 2020-06-24 DIAGNOSIS — F4310 Post-traumatic stress disorder, unspecified: Secondary | ICD-10-CM | POA: Diagnosis present

## 2020-06-24 DIAGNOSIS — F603 Borderline personality disorder: Secondary | ICD-10-CM | POA: Diagnosis present

## 2020-06-24 DIAGNOSIS — Z5900 Homelessness unspecified: Secondary | ICD-10-CM

## 2020-06-24 DIAGNOSIS — F1220 Cannabis dependence, uncomplicated: Secondary | ICD-10-CM | POA: Diagnosis present

## 2020-06-24 DIAGNOSIS — F4320 Adjustment disorder, unspecified: Secondary | ICD-10-CM | POA: Diagnosis present

## 2020-06-24 DIAGNOSIS — O26899 Other specified pregnancy related conditions, unspecified trimester: Secondary | ICD-10-CM | POA: Diagnosis present

## 2020-06-24 DIAGNOSIS — F19959 Other psychoactive substance use, unspecified with psychoactive substance-induced psychotic disorder, unspecified: Secondary | ICD-10-CM | POA: Diagnosis present

## 2020-06-24 DIAGNOSIS — F1010 Alcohol abuse, uncomplicated: Secondary | ICD-10-CM | POA: Diagnosis present

## 2020-06-24 DIAGNOSIS — R102 Pelvic and perineal pain: Secondary | ICD-10-CM | POA: Diagnosis present

## 2020-06-24 DIAGNOSIS — T50901A Poisoning by unspecified drugs, medicaments and biological substances, accidental (unintentional), initial encounter: Secondary | ICD-10-CM | POA: Diagnosis present

## 2020-06-24 LAB — CBC
HCT: 39 % (ref 36.0–46.0)
Hemoglobin: 13.5 g/dL (ref 12.0–15.0)
MCH: 32.4 pg (ref 26.0–34.0)
MCHC: 34.6 g/dL (ref 30.0–36.0)
MCV: 93.5 fL (ref 80.0–100.0)
Platelets: 255 10*3/uL (ref 150–400)
RBC: 4.17 MIL/uL (ref 3.87–5.11)
RDW: 12.9 % (ref 11.5–15.5)
WBC: 6.9 10*3/uL (ref 4.0–10.5)
nRBC: 0 % (ref 0.0–0.2)

## 2020-06-24 LAB — COMPREHENSIVE METABOLIC PANEL
ALT: 8 U/L (ref 0–44)
AST: 18 U/L (ref 15–41)
Albumin: 4.2 g/dL (ref 3.5–5.0)
Alkaline Phosphatase: 48 U/L (ref 38–126)
Anion gap: 7 (ref 5–15)
BUN: 12 mg/dL (ref 6–20)
CO2: 25 mmol/L (ref 22–32)
Calcium: 8.8 mg/dL — ABNORMAL LOW (ref 8.9–10.3)
Chloride: 105 mmol/L (ref 98–111)
Creatinine, Ser: 0.66 mg/dL (ref 0.44–1.00)
GFR calc Af Amer: >60 mL/min (ref >60)
GFR calc non Af Amer: >60 mL/min (ref >60)
Glucose, Bld: 81 mg/dL (ref 70–99)
Potassium: 3.8 mmol/L (ref 3.5–5.1)
Sodium: 137 mmol/L (ref 135–145)
Total Bilirubin: 0.7 mg/dL (ref 0.3–1.2)
Total Protein: 7.3 g/dL (ref 6.5–8.1)

## 2020-06-24 LAB — HCG, QUANTITATIVE, PREGNANCY: hCG, Beta Chain, Quant, S: <1 m[IU]/mL (ref <5)

## 2020-06-24 LAB — TROPONIN I (HIGH SENSITIVITY): Troponin I (High Sensitivity): 2 ng/L (ref <18)

## 2020-06-24 NOTE — ED Notes (Signed)
Pt denies SI/HI. Pt reports she is here because she is having shortness of breath, chest pain and itching.Pt is calm and basically cooperative at this time. Pt did dress out for this RN but wanted to make sure everyone understood she is not here for mental health evaluation.Pt does not wish to remove her jewelry or give up her phone. Pt states again she is not her for a mental health evaluation that she is here because she is short of breath and having chest pain. Pt does admit to using crack cocaine tonight. Pt seems to be at her baseline to this RN.

## 2020-06-24 NOTE — ED Provider Notes (Signed)
-----------------------------------------   12:47 PM on 06/24/2020 -----------------------------------------  Patient was seen and evaluated by psychiatry.  They believe the patient safe for discharge home from psychiatric standpoint.  Patient's medical work-up is largely nonrevealing.  I believe the patient safe for discharge home from a medical perspective.  We will discharge patient with outpatient resources.   Minna Antis, MD 06/24/20 1248

## 2020-06-24 NOTE — ED Notes (Signed)
Pt given meal tray.

## 2020-06-24 NOTE — ED Notes (Signed)
Per Dr Fuller Plan ok to leave pt with her necklaces and her cell phone. OK to not stick pt  for etoh level at this time.

## 2020-06-24 NOTE — Consult Note (Signed)
[facility] Face-to-Face Psychiatry Consult   Reason for Consult: Shortness of Breath Referring Physician: Dr. Fuller Plan Patient Identification: Bridget Carpenter MRN:  [id number] Principal Diagnosis: <principal problem not specified> Diagnosis:  Active Problems:   Tobacco use disorder   Asthma   Borderline personality disorder (HCC)   Cannabis use disorder, moderate, dependence (HCC)   Alcohol use disorder, mild, abuse   Overdose   Aspiration pneumonia (HCC)   Polysubstance abuse (HCC)   Schizoaffective disorder (HCC)   PTSD (post-traumatic stress disorder)   Psychoactive substance-induced psychosis (HCC)   Homelessness   Pain of round ligament affecting pregnancy, antepartum   MDD (major depressive disorder), recurrent episode, severe (HCC)   Adjustment disorder   Total Time spent with patient: 20 minutes   Subjective: " I don't want to see you. I want to see the doctor." Bridget Carpenter Bridget Carpenter is a 29 y.o. female  presented to [facility] ED via POV complaining of shortness of breath, itching skin, the patient voice she does not want to see the psychiatric team.  She voiced she is here because she feels short of breath and she is having chest pain.  The patient is a little agitated because the psychiatry team came to see her.    The patient was seen face-to-face by this provider; the chart was reviewed and consulted with Dr. Fuller Plan on 06/24/2020 due to the patient's care. It was discussed with the EDP that the patient is psychiatrically clear and does not meet the criteria for psychiatric inpatient admission.  Due to the patient voicing that she is not here for her mental health issues but for medical home problems that she is experiencing.  The patient is alert and oriented x4, agitated,  uncooperative, and mood-congruent with affect. The patient does not appear to be responding to internal and external stimuli. She is not presenting with delusional thinking. The patient denies auditory and visual  hallucinations. The patient denies suicidal, homicidal, or self-harm ideations. The patient is not presenting with psychotic or paranoid behaviors. During an encounter with the patient, she was able to answer questions appropriately.  Disposition: The patient is psychiatrically clear  Plan: The patient currently does not meet criteria for psychiatric inpatient evaluation.  HPI: Per Dr. Fuller Plan; Bridget Carpenter Bridget Carpenter is a 29 y.o. female with depression, schizophrenia, substance abuse who comes in with shortness of breath. Patient states that her shortness of breath started today.  Patient is asleep in bed.  Attempted to wake patient up to get more of a history with patient is not only wanting to participate in letting me know the severity, what makes it better, what makes it worse at this time.  She does deny any new unilateral leg swelling, being on birth control, recent travel or recent surgery.  When I asked her how she was doing mentally she stated "not well" but she denied any SI or HI.  Patient stated that she just wanted something to eat and wanted to rest. She does report drug use.   Past Psychiatric History:  Depression Dissociative disorder or reaction H/O: Suicide attempt ODD (oppositional defiant disorder) PTSD (post traumatic stress disorder) Schizoaffective disorder (HCC) Substance abuse (HCC)  Risk to Self:  No Risk to Others:  No Prior Inpatient Therapy:  Yes Prior Outpatient Therapy:  Yes  Past Medical History:  Past Medical History:  Diagnosis Date  . Asthma   . Depression 2009   Inpatient psych admission for SI, dissociative fugue  . Dissociative disorder or reaction 2009  .  Eczema   . H/O: suicide attempt   . ODD (oppositional defiant disorder)   . PTSD (post-traumatic stress disorder)   . Schizoaffective disorder ([place])   . Substance abuse [facility])     Past Surgical History:  Procedure Laterality Date  . ADENOIDECTOMY    . TONSILLECTOMY     Family History:   Family History  Adopted: Yes  Problem Relation Age of Onset  . Mental illness Mother   . Mental illness Father   . Heart disease Maternal Grandmother   . Sickle cell trait Adoptive Mother   . Cleft palate Cousin        maternal side   Family Psychiatric  History:  Social History:  Social History   Substance and Sexual Activity  Alcohol Use Not Currently  . Alcohol/week: 1.0 standard drink  . Types: 1 Cans of beer per week     Social History   Substance and Sexual Activity  Drug Use Not Currently  . Types: Marijuana, "Crack" cocaine, Heroin    Social History   Socioeconomic History  . Marital status: Single    Spouse name: Not on file  . Number of children: 1  . Years of education: Not on file  . Highest education level: Not on file  Occupational History  . Not on file  Tobacco Use  . Smoking status: Former Smoker    Packs/day: 0.75    Years: 2.00    Pack years: 1.50    Types: Cigarettes  . Smokeless tobacco: Never Used  . Tobacco comment: pt states she is quitting today  Vaping Use  . Vaping Use: Never used  Substance and Sexual Activity  . Alcohol use: Not Currently    Alcohol/week: 1.0 standard drink    Types: 1 Cans of beer per week  . Drug use: Not Currently    Types: Marijuana, "Crack" cocaine, Heroin  . Sexual activity: Not Currently    Partners: Male  Other Topics Concern  . Not on file  Social History Narrative   Is homeless   Her mother is her daughter's gaurdian   Multiple psychiatric hospitalizations, not on any medication currently   Substance use disorder   Social Determinants of Health   Financial Resource Strain: High Risk  . Difficulty of Paying Living Expenses: Very hard  Food Insecurity: Food Insecurity Present  . Worried About Charity fundraiser in the Last Year: Often true  . Ran Out of Food in the Last Year: Often true  Transportation Needs: No Transportation Needs  . Lack of Transportation (Medical): No  . Lack of  Transportation (Non-Medical): No  Physical Activity: Sufficiently Active  . Days of Exercise per Week: 4 days  . Minutes of Exercise per Session: 40 min  Stress: Stress Concern Present  . Feeling of Stress : Very much  Social Connections: Socially Isolated  . Frequency of Communication with Friends and Family: Never  . Frequency of Social Gatherings with Friends and Family: Never  . Attends Religious Services: Never  . Active Member of Clubs or Organizations: No  . Attends Archivist Meetings: Never  . Marital Status: Never married   Additional Social History:    Allergies:   Allergies  Allergen Reactions  . Banana Hives and Swelling  . Cantaloupe (Diagnostic) Hives  . Grapefruit Extract Itching and Swelling  . Albumin Human Other (See Comments)    Pt refuses all blood products.     Labs:  Results for orders placed or performed  during the hospital encounter of 06/24/20 (from the past 48 hour(s))  Comprehensive metabolic panel     Status: Abnormal   Collection Time: 06/24/20  1:14 AM  Result Value Ref Range   Sodium 137 135 - 145 mmol/L   Potassium 3.8 3.5 - 5.1 mmol/L   Chloride 105 98 - 111 mmol/L   CO2 25 22 - 32 mmol/L   Glucose, Bld 81 70 - 99 mg/dL    Comment: Glucose reference range applies only to samples taken after fasting for at least 8 hours.   BUN 12 6 - 20 mg/dL   Creatinine, Ser 5.18 0.44 - 1.00 mg/dL   Calcium 8.8 (L) 8.9 - 10.3 mg/dL   Total Protein 7.3 6.5 - 8.1 g/dL   Albumin 4.2 3.5 - 5.0 g/dL   AST 18 15 - 41 U/L   ALT 8 0 - 44 U/L   Alkaline Phosphatase 48 38 - 126 U/L   Total Bilirubin 0.7 0.3 - 1.2 mg/dL   GFR calc non Af Amer >60 >60 mL/min   GFR calc Af Amer >60 >60 mL/min   Anion gap 7 5 - 15    Comment: Performed at [facility], [address] [place], [place] [address]  cbc     Status: None   Collection Time: 06/24/20  1:14 AM  Result Value Ref Range   WBC 6.9 4.0 - 10.5 K/uL   RBC 4.17 3.87 - 5.11 MIL/uL    Hemoglobin 13.5 12.0 - 15.0 g/dL   HCT 06.3 36 - 46 %   MCV 93.5 80.0 - 100.0 fL   MCH 32.4 26.0 - 34.0 pg   MCHC 34.6 30.0 - 36.0 g/dL   RDW 01.6 01.0 - 93.2 %   Platelets 255 150 - 400 K/uL   nRBC 0.0 0.0 - 0.2 %    Comment: Performed at [facility], [address], [place], [place] [address]  Troponin I (High Sensitivity)     Status: None   Collection Time: 06/24/20  1:14 AM  Result Value Ref Range   Troponin I (High Sensitivity) 2 <18 ng/L    Comment: (NOTE) Elevated high sensitivity troponin I (hsTnI) values and significant  changes across serial measurements may suggest ACS but many other  chronic and acute conditions are known to elevate hsTnI results.  Refer to the "Links" section for chest pain algorithms and additional  guidance. Performed at [facility], [address] [place], [place] [address]   hCG, quantitative, pregnancy     Status: None   Collection Time: 06/24/20  1:14 AM  Result Value Ref Range   hCG, Beta Chain, Quant, S <1 <5 mIU/mL    Comment:          GEST. AGE      CONC.  (mIU/mL)   <=1 WEEK        5 - 50     2 WEEKS       50 - 500     3 WEEKS       100 - 10,000     4 WEEKS     1,000 - 30,000     5 WEEKS     3,500 - 115,000   6-8 WEEKS     12,000 - 270,000    12 WEEKS     15,000 - 220,000        FEMALE AND NON-PREGNANT FEMALE:     LESS THAN 5 mIU/mL Performed at [facility], [address] [place], [place] [address]     No current facility-administered medications for this encounter.   No current outpatient medications on file.    Musculoskeletal: Strength & Muscle Tone: within normal limits Gait & Station: normal Patient leans: N/A  Psychiatric Specialty Exam: Physical Exam Vitals and nursing note reviewed.  Constitutional:      Appearance: She is well-developed.  Cardiovascular:     Rate and Rhythm: Normal rate.  Pulmonary:     Effort: Pulmonary effort is normal.  Musculoskeletal:        General:  Normal range of motion.     Cervical back: Normal range of motion and neck supple.  Psychiatric:        Behavior: Behavior normal.     Review of Systems  Psychiatric/Behavioral: Positive for confusion and hallucinations. The patient is nervous/anxious.   All other systems reviewed and are negative.   Blood pressure 122/73, pulse (!) 51, temperature 98.5 F (36.9 C), temperature source Oral, resp. rate 20, height 5\' 4"  (1.626 m), weight 52.2 kg, SpO2 96 %, unknown if currently breastfeeding.Body mass index is 19.74 kg/m.  General Appearance: Disheveled  Eye Contact:  Minimal  Speech:  Clear and Coherent  Volume:  Normal  Mood:  Angry and Anxious  Affect:  Congruent  Thought Process:  Coherent  Orientation:  Full (Time, Place, and Person)  Thought Content:  Logical  Suicidal Thoughts:  No  Homicidal Thoughts:  No  Memory:  Immediate;   Good Recent;   Good Remote;   Good  Judgement:  Fair  Insight:  Shallow  Psychomotor Activity:  Normal  Concentration:  Concentration: Fair and Attention Span: Fair  Recall:  of Knowledge:  Fair  Language:  Good  Akathisia:  Negative  Handed:  Right  AIMS (if indicated):     Assets:  Communication Skills Desire for Improvement Social Support  ADL's:  Intact  Cognition:  WNL  Sleep:    Okay     Treatment Plan Summary: Daily contact with patient to assess and evaluate symptoms and progress in treatment and Medication management  Disposition: No evidence of imminent risk to self or others at present.   Patient does not meet criteria for psychiatric inpatient admission. Supportive therapy provided about ongoing stressors. Refer to IOP. Discussed crisis plan, support from social network, calling 911, coming to the Emergency Department, and calling Suicide Hotline. [employer], NP  05/17/2020 10:00PM

## 2020-06-24 NOTE — Discharge Instructions (Addendum)
Your work-up for your shortness of breath was reassuring.  There are no signs of pneumonia or low blood levels.  You should follow-up with your primary care doctor or return to the ER if your symptoms are worsening

## 2020-06-24 NOTE — ED Provider Notes (Signed)
[facility] Provider Note  ____________________________________________   First MD Initiated Contact with Patient 06/24/20 [phone]     (approximate)  I have reviewed the triage vital signs and the nursing notes.   HISTORY  Chief Complaint Shortness of Breath    HPI [facility] Ting Cage is a 29 y.o. female with depression, schizophrenia, substance abuse who comes in with shortness of breath. Patient states that her shortness of breath started today.  Patient is asleep in bed.  Attempted to wake patient up to get more of a history with patient is not only wanting to participate in letting me know the severity, what makes it better, what makes it worse at this time.  She does deny any new unilateral leg swelling, being on birth control, recent travel or recent surgery.  When I asked her how she was doing mentally she stated "not well" but she denied any SI or HI.  Patient stated that she just wanted something to eat and wanted to rest. She does report drug use.           Past Medical History:  Diagnosis Date  . Asthma   . Depression 2009   Inpatient psych admission for SI, dissociative fugue  . Dissociative disorder or reaction 2009  . Eczema   . H/O: suicide attempt   . ODD (oppositional defiant disorder)   . PTSD (post-traumatic stress disorder)   . Schizoaffective disorder (HCC)   . Substance abuse [facility])     Patient Active Problem List   Diagnosis Date Noted  . Adjustment disorder   . MDD (major depressive disorder), recurrent episode, severe (HCC) 08/31/2019  . Indication for care in labor and delivery, antepartum 08/29/2019  . Hydrops fetalis 08/29/2019  . Indication for care in labor or delivery 08/14/2019  . Dehydration 08/14/2019  . Rubella non-immune status, antepartum 08/01/2019  . Pain of round ligament affecting pregnancy, antepartum 08/01/2019  . Labor and delivery indication for care or intervention 07/30/2019    . Homelessness 07/25/2019  . Decreased fetal movement 07/02/2019  . Club foot, fetal, affecting care of mother, antepartum 06/29/2019  . Supervision of high risk pregnancy, antepartum 06/19/2019  . Cocaine abuse with cocaine-induced mood disorder (HCC) 04/15/2019  . 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/03/2018  . Bacterial vaginosis 12/25/2017  . PTSD (post-traumatic stress disorder) 09/02/2017  . Schizoaffective disorder (HCC) 09/01/2017  . MRSA carrier 07/18/2017  . Overdose 07/16/2017  . Aspiration pneumonia (HCC) 07/16/2017  . Acute respiratory failure (HCC) 07/16/2017  . Polysubstance abuse (HCC) 07/16/2017  . Eczema 05/08/2017  . Borderline personality disorder (HCC) 11/06/2016  . Cocaine use disorder, severe, dependence (HCC) 11/06/2016  . Cannabis use disorder, moderate, dependence (HCC) 11/06/2016  . Alcohol use disorder, mild, abuse 11/06/2016  . Asthma 09/23/2011  . Tobacco use disorder 09/15/2009    Past Surgical History:  Procedure Laterality Date  . ADENOIDECTOMY    . TONSILLECTOMY      Prior to Admission medications   Not on File    Allergies Banana, Cantaloupe (diagnostic), Grapefruit extract, and Albumin human  Family History  Adopted: Yes  Problem Relation Age of Onset  . Mental illness Mother   . Mental illness Father   . Heart disease Maternal Grandmother   . Sickle cell trait Adoptive Mother   . Cleft palate Cousin        maternal side    Social History Social History   Tobacco Use  . Smoking status: Former Smoker  Packs/day: 0.75    Years: 2.00    Pack years: 1.50    Types: Cigarettes  . Smokeless tobacco: Never Used  . Tobacco comment: pt states she is quitting today  Vaping Use  . Vaping Use: Never used  Substance Use Topics  . Alcohol use: Not Currently    Alcohol/week: 1.0 standard drink    Types: 1 Cans of beer per week  . Drug use: Not Currently    Types: Marijuana, "Crack" cocaine, Heroin      Review  of Systems Constitutional: No fever/chills Eyes: No visual changes. ENT: No sore throat. Cardiovascular: No chest pain Respiratory: Positive for SOB Gastrointestinal: No abdominal pain.  No nausea, no vomiting.  No diarrhea.  No constipation. Genitourinary: Negative for dysuria. Musculoskeletal: Negative for back pain. Skin: Negative for rash. Neurological: Negative for headaches, focal weakness or numbness. All other ROS negative ____________________________________________   PHYSICAL EXAM:  VITAL SIGNS: ED Triage Vitals  Enc Vitals Group     BP 06/24/20 0105 122/73     Pulse Rate 06/24/20 0105 (!) 51     Resp 06/24/20 0105 20     Temp 06/24/20 0105 98.5 F (36.9 C)     Temp Source 06/24/20 0105 Oral     SpO2 06/24/20 0105 96 %     Weight 06/24/20 0106 115 lb (52.2 kg)     Height 06/24/20 0106 5\' 4"  (1.626 m)     Head Circumference --      Peak Flow --      Pain Score 06/24/20 0106 0     Pain Loc --      Pain Edu? --      Excl. in GC? --     Constitutional: Alert and oriented. Pt asleep in bed. Wakens with tactile stimulus.  Eyes: Conjunctivae are normal. EOMI. Head: Atraumatic. Nose: No congestion/rhinnorhea. Mouth/Throat: Mucous membranes are moist.   Neck: No stridor. Trachea Midline. FROM Cardiovascular: Normal rate, regular rhythm. Grossly normal heart sounds.  Good peripheral circulation. Respiratory: Clear lungs bilaterally, normal work of breathing Gastrointestinal: Soft and nontender. No distention. No abdominal bruits.  Musculoskeletal: No lower extremity tenderness nor edema.  No joint effusions. Neurologic:  Normal speech and language. No gross focal neurologic deficits are appreciated.  Skin:  Skin is warm, dry and intact. No rash noted. Psychiatric: Mood and affect are normal. Speech and behavior are normal. Pt reports "not doing well" but denies hallucinations, SI, HI.   GU: Deferred   ____________________________________________   LABS (all labs  ordered are listed, but only abnormal results are displayed)  Labs Reviewed  COMPREHENSIVE METABOLIC PANEL - Abnormal; Notable for the following components:      Result Value   Calcium 8.8 (*)    All other components within normal limits  SARS CORONAVIRUS 2 BY RT PCR (HOSPITAL ORDER, PERFORMED IN [facility])  CBC  URINE DRUG SCREEN, QUALITATIVE ([facility] ONLY)  ETHANOL  HCG, QUANTITATIVE, PREGNANCY  POC URINE PREG, ED  TROPONIN I (HIGH SENSITIVITY)  TROPONIN I (HIGH SENSITIVITY)   ____________________________________________   ED ECG REPORT I, 06/26/20, the attending physician, personally viewed and interpreted this ECG.  Normal sinus rate of 76, no ST elevation, no T wave inversions, normal intervals ____________________________________________  RADIOLOGY Concha Se, personally viewed and evaluated these images (plain radiographs) as part of my medical decision making, as well as reviewing the written report by the radiologist.  ED MD interpretation: No pneumonia  Official radiology  report(s): DG Chest 2 View  Result Date: 06/24/2020 CLINICAL DATA:  Shortness of breath EXAM: CHEST - 2 VIEW COMPARISON:  08/28/2018 FINDINGS: Frontal and lateral views of the chest demonstrate a stable cardiac silhouette. No airspace disease, effusion, or pneumothorax. No acute bony abnormalities. IMPRESSION: 1. No acute intrathoracic process. Electronically Signed   By: Randa Ngo M.D.   On: 06/24/2020 02:13    ____________________________________________   PROCEDURES  Procedure(s) performed (including Critical Care):  Procedures   ____________________________________________   INITIAL IMPRESSION / ASSESSMENT AND PLAN / ED COURSE   [name] Rasheema Truluck was evaluated in Emergency Department on 06/24/2020 for the symptoms described in the history of present illness. She was evaluated in the context of the global COVID-19 pandemic, which necessitated consideration  that the patient might be at risk for infection with the SARS-CoV-2 virus that causes COVID-19. Institutional protocols and algorithms that pertain to the evaluation of patients at risk for COVID-19 are in a state of rapid change based on information released by regulatory bodies including the CDC and federal and state organizations. These policies and algorithms were followed during the patient's care in the ED.     Pt presents with SOB. Differential includes: PNA-will get xray to evaluation Anemia-CBC to evaluate ACS- will get trops  Arrhythmia-Will get EKG  COVID- will get testing per algorithm. PE-lower suspicion given no risk factors and other cause more likely, PERC negative  From a psychiatric standpoint patient does not appear manic.  Patient appears tired potential coming off substances.  She denies any SI, HI.  She does not appear psychotic.  At this point do not think she meets IVC criteria although patient is willing to stay for psychiatric evaluation and requesting food and being able to sleep.  Will put in consult just given patient was recently [facility] to make sure they do not feel that patient needs any other acute psychiatric needs but at this time I do not think she meets IVC criteria  No evidence of anemia, kidney dysfunction, ACS, chest x-ray negative.  Her oxygen levels are normal and her lungs sounded clear.  At this time I have low suspicion for acute cause for her shortness of breath.  Possibly secondary to the substances she used previously.  Patient is resting comfortably in bed without any increased work of breathing.    This time I think patient is medically cleared to be dispositioned by psychiatric team  The patient has been placed in psychiatric observation due to the need to provide a safe environment for the patient while obtaining psychiatric consultation and evaluation, as well as ongoing medical and medication management to treat the patient's  condition.  The patient has not been placed under full IVC at this time.    ____________________________________________   FINAL CLINICAL IMPRESSION(S) / ED DIAGNOSES   Final diagnoses:  SOB (shortness of breath)  Substance abuse ([place])     MEDICATIONS GIVEN DURING THIS VISIT:  Medications - No data to display   ED Discharge Orders    None       Note:  This document was prepared using Dragon voice recognition software and may include unintentional dictation errors.   Vanessa [place], MD 06/24/20 [phone]

## 2020-06-24 NOTE — BH Assessment (Signed)
Assessment Note  [facility] is an 29 y.o. female presenting to [facility] ED volutarily for physical health concerns. Per triage note Pt states she is here for shob. Pt is itching skin, states she does not want to be in a place "where people are in psychosis". Pt states 25-30 min of off and on shob. Pt appears manic. During assessment patient was alert and oriented x4 and irritable. When asked why patient was presenting to the ED patient reported "I'm having a hard time breathing." Patient reported that she did not need to be evaluated for her mental health "I want the ED doctor to check my blood pressure and my respirations." Patient denies SI/HI/AH/VH and does not appear to be responding to any internal or external stimuli.   Per Psyc NP patient does not meet criteria for Inpatient Hospitalization.  Diagnosis: Substance Abuse, Schizoaffective Disorder by history   Past Medical History:  Past Medical History:  Diagnosis Date   Asthma    Depression 2009   Inpatient psych admission for SI, dissociative fugue   Dissociative disorder or reaction 2009   Eczema    H/O: suicide attempt    ODD (oppositional defiant disorder)    PTSD (post-traumatic stress disorder)    Schizoaffective disorder ([place])    Substance abuse ([place])     Past Surgical History:  Procedure Laterality Date   ADENOIDECTOMY     TONSILLECTOMY      Family History:  Family History  Adopted: Yes  Problem Relation Age of Onset   Mental illness Mother    Mental illness Father    Heart disease Maternal Grandmother    Sickle cell trait Adoptive Mother    Cleft palate Cousin        maternal side    Social History:  reports that she has quit smoking. Her smoking use included cigarettes. She has a 1.50 pack-year smoking history. She has never used smokeless tobacco. She reports previous alcohol use of about 1.0 standard drink of alcohol per week. She reports previous drug use. Drugs: Marijuana, "Crack"  cocaine, and Heroin.  Additional Social History:  Alcohol / Drug Use Pain Medications: See MAR Prescriptions: See MAR Over the Counter: See MAR History of alcohol / drug use?: Yes Substance #1 Name of Substance 1: Cocaine  CIWA: CIWA-Ar BP: 122/73 Pulse Rate: (!) 51 COWS:    Allergies:  Allergies  Allergen Reactions   Banana Hives and Swelling   Cantaloupe (Diagnostic) Hives   Grapefruit Extract Itching and Swelling   Albumin Human Other (See Comments)    Pt refuses all blood products.     Home Medications: (Not in a hospital admission)   OB/GYN Status:  No LMP recorded (lmp unknown).  General Assessment Data [facility] of Assessment: [facility] TTS Assessment: In system Is this a Tele or Face-to-Face Assessment?: Face-to-Face Is this an Initial Assessment or a Re-assessment for this encounter?: Initial Assessment Patient Accompanied by:: N/A Language Other than English: No Living Arrangements: Homeless/Shelter What gender do you identify as?: Female Marital status: Single Pregnancy Status: No Living Arrangements: Other (Comment) (Homeless) Can pt return to current living arrangement?: Yes Admission Status: Voluntary Is patient capable of signing voluntary admission?: Yes Referral Source: Self/Family/Friend Insurance type: Medicaid  Medical Screening Exam ([place]) Medical Exam completed: Yes  Crisis Care Plan Living Arrangements: Other (Comment) (Homeless) Legal Guardian: Other: (Self) Name of Psychiatrist: None Name of Therapist: None  Education Status Is patient currently in school?: No Is the patient employed,  unemployed or receiving disability?: Unemployed  Risk to self with the past 6 months Suicidal Ideation: No Has patient been a risk to self within the past 6 months prior to admission? : No Suicidal Intent: No Has patient had any suicidal intent within the past 6 months prior to admission? : No Is patient at risk for suicide?:  No Suicidal Plan?: No Has patient had any suicidal plan within the past 6 months prior to admission? : No Access to Means: No What has been your use of drugs/alcohol within the last 12 months?: Cocaine and Alcohol Previous Attempts/Gestures: No How many times?: 0 Other Self Harm Risks: None Triggers for Past Attempts: None known Intentional Self Injurious Behavior: None Family Suicide History: Unknown Recent stressful life event(s): Other (Comment) (None reported) Persecutory voices/beliefs?: No Depression: No Substance abuse history and/or treatment for substance abuse?: No Suicide prevention information given to non-admitted patients: Not applicable  Risk to Others within the past 6 months Homicidal Ideation: No Does patient have any lifetime risk of violence toward others beyond the six months prior to admission? : No Thoughts of Harm to Others: No Current Homicidal Intent: No Current Homicidal Plan: No Access to Homicidal Means: No Identified Victim: None History of harm to others?: No Assessment of Violence: None Noted Violent Behavior Description: None Does patient have access to weapons?: No Criminal Charges Pending?: No Describe Pending Criminal Charges: None Does patient have a court date: No Is patient on probation?: No  Psychosis Hallucinations: None noted Delusions: None noted  Mental Status Report Appearance/Hygiene: Unremarkable Eye Contact: Poor Motor Activity: Freedom of movement Speech: Logical/coherent Level of Consciousness: Alert Mood: Irritable Affect: Irritable Anxiety Level: None Thought Processes: Coherent Judgement: Unimpaired Orientation: Person, Place, Time, Situation, Appropriate for developmental age Obsessive Compulsive Thoughts/Behaviors: None  Cognitive Functioning Concentration: Normal Memory: Recent Intact, Remote Intact Is patient IDD: No Insight: Poor Impulse Control: Fair Appetite: Fair Have you had any weight changes? :  No Change Sleep: No Change Total Hours of Sleep: 4 Vegetative Symptoms: None  ADLScreening [employer] Assessment Services) Patient's cognitive ability adequate to safely complete daily activities?: Yes Patient able to express need for assistance with ADLs?: Yes Independently performs ADLs?: Yes (appropriate for developmental age)  Prior Inpatient Therapy Prior Inpatient Therapy: Yes Prior Therapy Dates: 12/04/2018, 05/03/2018, 12/25/2017, 07/18/2017, 05/07/2017 Prior Therapy Facilty/Provider(s): [facility] & [facility] Reason for Treatment: Schizoaffective Disorder, Cocaine Abuse  Prior Outpatient Therapy Prior Outpatient Therapy: No Does patient have an ACCT team?: No Does patient have Intensive In-House Services?  : No Does patient have [facility] services? : No Does patient have P4CC services?: No  ADL Screening (condition at time of admission) Patient's cognitive ability adequate to safely complete daily activities?: Yes Is the patient deaf or have difficulty hearing?: No Does the patient have difficulty seeing, even when wearing glasses/contacts?: No Does the patient have difficulty concentrating, remembering, or making decisions?: No Patient able to express need for assistance with ADLs?: Yes Does the patient have difficulty dressing or bathing?: No Independently performs ADLs?: Yes (appropriate for developmental age) Does the patient have difficulty walking or climbing stairs?: No Weakness of Legs: None Weakness of Arms/Hands: None  Home Assistive Devices/Equipment Home Assistive Devices/Equipment: None  Therapy Consults (therapy consults require a physician order) PT Evaluation Needed: No OT Evalulation Needed: No SLP Evaluation Needed: No Abuse/Neglect Assessment (Assessment to be complete while patient is alone) Abuse/Neglect Assessment Can Be Completed: Yes Physical Abuse: Denies Verbal Abuse: Denies Sexual Abuse: Denies Exploitation of patient/patient's resources:  Denies Self-Neglect: Denies Values / Beliefs Cultural Requests During Hospitalization: None Spiritual Requests During Hospitalization: None Consults Spiritual Care Consult Needed: No Transition of Care Team Consult Needed: No            Disposition: Per Psyc NP patient does not meet criteria for Inpatient Hospitalization. Disposition Initial Assessment Completed for this Encounter: Yes  On Site Evaluation by:   Reviewed with Physician:    Benay Pike MS LCASA 06/24/2020 5:31 AM

## 2020-06-24 NOTE — ED Notes (Signed)
Pt alert and eating. Pt informed that she is ready for discharge.

## 2020-06-24 NOTE — ED Notes (Addendum)
Pt asleep, breakfast tray placed on sink in rm.  

## 2020-06-24 NOTE — Care Management (Signed)
Placed several resources on patient instructions to start for homelessness resources as well as PCP

## 2020-06-24 NOTE — ED Notes (Signed)
Psych NP and TTS in to see pt.  

## 2020-06-24 NOTE — ED Notes (Signed)
Pt given her belongings to change back into her clothes

## 2020-06-24 NOTE — ED Triage Notes (Signed)
Pt states she is here for shob. Pt is itching skin, states she does not want to be in a place "where people are in psychosis". Pt states 25-30 min of off and on shob. Pt appears manic.

## 2020-06-26 ENCOUNTER — Emergency Department: Payer: Medicaid Other

## 2020-06-26 ENCOUNTER — Emergency Department
Admission: EM | Admit: 2020-06-26 | Discharge: 2020-06-27 | Disposition: A | Discharge status: Home / Self Care | Payer: Medicaid Other | Attending: Emergency Medicine | Admitting: Emergency Medicine

## 2020-06-26 ENCOUNTER — Other Ambulatory Visit: Payer: Self-pay

## 2020-06-26 ENCOUNTER — Encounter: Payer: Self-pay | Admitting: Intensive Care

## 2020-06-26 DIAGNOSIS — R05 Cough: Secondary | ICD-10-CM | POA: Diagnosis not present

## 2020-06-26 DIAGNOSIS — Z5900 Homelessness unspecified: Secondary | ICD-10-CM

## 2020-06-26 DIAGNOSIS — J45909 Unspecified asthma, uncomplicated: Secondary | ICD-10-CM | POA: Diagnosis not present

## 2020-06-26 DIAGNOSIS — Z87891 Personal history of nicotine dependence: Secondary | ICD-10-CM | POA: Diagnosis not present

## 2020-06-26 DIAGNOSIS — Z20822 Contact with and (suspected) exposure to covid-19: Secondary | ICD-10-CM | POA: Diagnosis not present

## 2020-06-26 DIAGNOSIS — R059 Cough, unspecified: Secondary | ICD-10-CM

## 2020-06-26 DIAGNOSIS — R42 Dizziness and giddiness: Secondary | ICD-10-CM | POA: Diagnosis present

## 2020-06-26 LAB — COMPREHENSIVE METABOLIC PANEL
ALT: 9 U/L (ref 0–44)
AST: 18 U/L (ref 15–41)
Albumin: 4.1 g/dL (ref 3.5–5.0)
Alkaline Phosphatase: 44 U/L (ref 38–126)
Anion gap: 9 (ref 5–15)
BUN: 11 mg/dL (ref 6–20)
CO2: 24 mmol/L (ref 22–32)
Calcium: 9.1 mg/dL (ref 8.9–10.3)
Chloride: 103 mmol/L (ref 98–111)
Creatinine, Ser: 0.61 mg/dL (ref 0.44–1.00)
GFR calc Af Amer: >60 mL/min (ref >60)
GFR calc non Af Amer: >60 mL/min (ref >60)
Glucose, Bld: 80 mg/dL (ref 70–99)
Potassium: 3.4 mmol/L — ABNORMAL LOW (ref 3.5–5.1)
Sodium: 136 mmol/L (ref 135–145)
Total Bilirubin: 1.2 mg/dL (ref 0.3–1.2)
Total Protein: 7.4 g/dL (ref 6.5–8.1)

## 2020-06-26 LAB — CBC
HCT: 40.2 % (ref 36.0–46.0)
Hemoglobin: 13.3 g/dL (ref 12.0–15.0)
MCH: 31.7 pg (ref 26.0–34.0)
MCHC: 33.1 g/dL (ref 30.0–36.0)
MCV: 95.9 fL (ref 80.0–100.0)
Platelets: 237 10*3/uL (ref 150–400)
RBC: 4.19 MIL/uL (ref 3.87–5.11)
RDW: 12.9 % (ref 11.5–15.5)
WBC: 7.4 10*3/uL (ref 4.0–10.5)
nRBC: 0 % (ref 0.0–0.2)

## 2020-06-26 LAB — ACETAMINOPHEN LEVEL: Acetaminophen (Tylenol), Serum: <10 ug/mL — ABNORMAL LOW (ref 10–30)

## 2020-06-26 LAB — SALICYLATE LEVEL: Salicylate Lvl: <7 mg/dL — ABNORMAL LOW (ref 7.0–30.0)

## 2020-06-26 LAB — ETHANOL: Alcohol, Ethyl (B): <10 mg/dL (ref <10)

## 2020-06-26 LAB — SARS CORONAVIRUS 2 BY RT PCR (HOSPITAL ORDER, PERFORMED IN ~~LOC~~ HOSPITAL LAB): SARS Coronavirus 2: NEGATIVE

## 2020-06-26 NOTE — ED Triage Notes (Signed)
Pt in via EMS from home with c/o feeling faint. 100% RA

## 2020-06-26 NOTE — ED Triage Notes (Signed)
Patient c/o dizziness, sob and near syncope. Patient denies SI/HI. Patient cannot stay awake during triage. Patient denies recent drugs or alcohol intake

## 2020-06-26 NOTE — ED Notes (Signed)
Pt asleep in bed at this time.

## 2020-06-26 NOTE — ED Provider Notes (Signed)
[facility] Emergency Department Provider Note   ____________________________________________   First MD Initiated Contact with Patient 06/26/20 1439     (approximate)  I have reviewed the triage vital signs and the nursing notes.   HISTORY  Chief Complaint Dizziness    HPI Bridget Carpenter is a 29 y.o. female who comes in complaining of dizziness.  She tells the nurse that her right leg is numb.  She tells me that she is homeless and has not eaten in 3 days and wants some help.  She does not same thing about her leg being numb even when I asked her.  She does not thinking about doing this even when I asked her.  We do not have any sent which is up your so I get her some ice cream and applesauce and apple juice and later were able to get her a sandwich but at that point she is taking a nap and when we wake her up until her but her sat which is low she says okay and she goes back to sleep.  We will check a CBG on her to make sure that is okay.  Otherwise her lab work looks essentially okay.  Chest x-ray is clear.  Her Covid test is negative.     Past Medical History:  Diagnosis Date   Asthma    Depression 2009   Inpatient psych admission for SI, dissociative fugue   Dissociative disorder or reaction 2009   Eczema    H/O: suicide attempt    ODD (oppositional defiant disorder)    PTSD (post-traumatic stress disorder)    Schizoaffective disorder (HCC)    Substance abuse (HCC)     Patient Active Problem List   Diagnosis Date Noted   Adjustment disorder    MDD (major depressive disorder), recurrent episode, severe (HCC) 08/31/2019   Indication for care in labor and delivery, antepartum 08/29/2019   Hydrops fetalis 08/29/2019   Indication for care in labor or delivery 08/14/2019   Dehydration 08/14/2019   Rubella non-immune status, antepartum 08/01/2019   Pain of round ligament affecting pregnancy, antepartum 08/01/2019   Labor  and delivery indication for care or intervention 07/30/2019   Homelessness 07/25/2019   Decreased fetal movement 07/02/2019   Club foot, fetal, affecting care of mother, antepartum 06/29/2019   Supervision of high risk pregnancy, antepartum 06/19/2019   Cocaine abuse with cocaine-induced mood disorder (HCC) 04/15/2019   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/03/2018   Bacterial vaginosis 12/25/2017   PTSD (post-traumatic stress disorder) 09/02/2017   Schizoaffective disorder (HCC) 09/01/2017   MRSA carrier 07/18/2017   Overdose 07/16/2017   Aspiration pneumonia (HCC) 07/16/2017   Acute respiratory failure (HCC) 07/16/2017   Polysubstance abuse (HCC) 07/16/2017   Eczema 05/08/2017   Borderline personality disorder (HCC) 11/06/2016   Cocaine use disorder, severe, dependence (HCC) 11/06/2016   Cannabis use disorder, moderate, dependence (HCC) 11/06/2016   Alcohol use disorder, mild, abuse 11/06/2016   Asthma 09/23/2011   Tobacco use disorder 09/15/2009    Past Surgical History:  Procedure Laterality Date   ADENOIDECTOMY     TONSILLECTOMY      Prior to Admission medications   Not on File    Allergies Banana, Cantaloupe (diagnostic), Grapefruit extract, and Albumin human  Family History  Adopted: Yes  Problem Relation Age of Onset   Mental illness Mother    Mental illness Father    Heart disease Maternal Grandmother    Sickle cell trait Adoptive Mother  Cleft palate Cousin        maternal side    Social History Social History   Tobacco Use   Smoking status: Former Smoker    Packs/day: 0.75    Years: 2.00    Pack years: 1.50    Types: Cigarettes   Smokeless tobacco: Never Used   Tobacco comment: pt states she is quitting today  Vaping Use   Vaping Use: Never used  Substance Use Topics   Alcohol use: Yes    Alcohol/week: 1.0 standard drink    Types: 1 Cans of beer per week   Drug use: Yes    Types: Marijuana,  "Crack" cocaine, Heroin    Review of Systems  Constitutional: No fever/chills Eyes: No visual changes. ENT: No sore throat. Cardiovascular: Denies chest pain. Respiratory: Possibly chronic shortness of breath. Gastrointestinal: No abdominal pain.  No nausea, no vomiting.  No diarrhea.  No constipation. Genitourinary: Negative for dysuria. Musculoskeletal: Negative for back pain. Skin: Negative for rash. Neurological: Negative for headaches, focal weakness   ____________________________________________   PHYSICAL EXAM:  VITAL SIGNS: ED Triage Vitals  Enc Vitals Group     BP 06/26/20 1134 100/70     Pulse Rate 06/26/20 1134 82     Resp 06/26/20 1134 18     Temp 06/26/20 1134 98.1 F (36.7 C)     Temp Source 06/26/20 1134 Oral     SpO2 06/26/20 1134 100 %     Weight 06/26/20 1128 115 lb (52.2 kg)     Height 06/26/20 1128 5\' 6"  (1.676 m)     Head Circumference --      Peak Flow --      Pain Score 06/26/20 1151 0     Pain Loc --      Pain Edu? --      Excl. in GC? --     Constitutional: Alert and oriented. Well appearing and in no acute distress. Eyes: Conjunctivae are normal. PER EOMI. Head: Atraumatic. Nose: No congestion/rhinnorhea. Mouth/Throat: Mucous membranes are moist.  Oropharynx non-erythematous. Neck: No stridor.  Cardiovascular: Normal rate, regular rhythm. Grossly normal heart sounds.  Good peripheral circulation. Respiratory: Patient coughed several times initially but then stops coughing.  Normal respiratory effort.  No retractions. Lungs CTAB. Gastrointestinal: Soft and nontender. No distention. No abdominal bruits.  Musculoskeletal: No lower extremity tenderness nor edema.. Neurologic:  Normal speech and language. No gross focal neurologic deficits are appreciated. . Skin:  Skin is warm, dry and intact. No rash noted.   ____________________________________________   LABS (all labs ordered are listed, but only abnormal results are  displayed)  Labs Reviewed  COMPREHENSIVE METABOLIC PANEL - Abnormal; Notable for the following components:      Result Value   Potassium 3.4 (*)    All other components within normal limits  SALICYLATE LEVEL - Abnormal; Notable for the following components:   Salicylate Lvl <7.0 (*)    All other components within normal limits  ACETAMINOPHEN LEVEL - Abnormal; Notable for the following components:   Acetaminophen (Tylenol), Serum <10 (*)    All other components within normal limits  SARS CORONAVIRUS 2 BY RT PCR (HOSPITAL ORDER, PERFORMED IN [facility])  ETHANOL  CBC  URINE DRUG SCREEN, QUALITATIVE ([facility] ONLY)  POC URINE PREG, ED   ____________________________________________  EKG   ____________________________________________  RADIOLOGY  ED MD interpretation: Chest x-ray read by radiology reviewed by me is negative Official radiology report(s): DG Chest 2 View  Result Date:  06/26/2020 CLINICAL DATA:  Dizziness and near syncopal episode today EXAM: CHEST - 2 VIEW COMPARISON:  06/24/2020 FINDINGS: Cardiac shadows within normal limits. The lungs are well aerated bilaterally. No focal infiltrate or sizable effusion is seen. Nipple shadow is noted over the left base. No new focal abnormality is seen. IMPRESSION: No acute abnormality noted. Electronically Signed   By: Alcide Clever M.D.   On: 06/26/2020 15:47    ____________________________________________   PROCEDURES  Procedure(s) performed (including Critical Care):  Procedures   ____________________________________________   INITIAL IMPRESSION / ASSESSMENT AND PLAN / ED COURSE  Lab work is essentially negative.  Patient's physical exam is essentially negative.  She cannot give me any history except for being homeless.  However she is a young female we will see if we can get social work to do anything for her.  If that does not work and she does not perk up a little we will get psych to see if she is  depressed she is not talking to me much about that sort of thing.              ____________________________________________   FINAL CLINICAL IMPRESSION(S) / ED DIAGNOSES  Final diagnoses:  Cough     ED Discharge Orders    None       Note:  This document was prepared using Dragon voice recognition software and may include unintentional dictation errors.    Arnaldo Natal, MD 06/26/20 1725

## 2020-06-27 NOTE — ED Provider Notes (Signed)
-----------------------------------------   7:26 AM on 06/27/2020 -----------------------------------------  The patient is awaiting social work consultation to see if any outpatient resources may be available to her.  She has been calm and cooperative overnight with no acute events.  She is tolerating oral intake without difficulty.  She is here voluntarily and may be discharged or allowed to leave that anytime she decides to do so.  Otherwise she is awaiting a social work Administrator, sports.   Loleta Rose, MD 06/27/20 [phone]

## 2020-06-27 NOTE — ED Notes (Signed)
Pt pending social work consult, eating sandwich tray.

## 2020-06-27 NOTE — ED Notes (Signed)
Sandwich tray and beverage given to pt.  

## 2020-06-27 NOTE — ED Notes (Addendum)
Breakfast tray given to pt. Pt sleeping.

## 2020-06-27 NOTE — TOC Initial Note (Addendum)
Transition of Care [facility]) - Initial/Assessment Note    Patient Details  Name: Bridget Carpenter MRN: [id number] Date of Birth: 05/21/1991  [facility] CM/SW Contact:    Palomas Cellar, RN Phone Number: 06/27/2020, 2:55 PM  Clinical Narrative:                 Spoke with patient at bedside who was removing and replacing sheets on the bed while sweeping crumbs from the floor. Patient is not able to follow conversation and refused all information related to homeless shelters stating she hates them all. Patient states she just wants to be admitted and given Robitussin for her bronchitis. Patient states she is waiting on her shower as well. Patient did acknowledge the resources related to local food banks and a schedule for free meals in the community. Patient said she would walk out of emergency room when she was ready. Patient was pacing during meeting and opening/closing drawers with out purpose. Patient states she has no needs if this writer is not willing to admit her to inpatient. RN CM explained purpose of visit was to address discharge needs and patient states she has no needs other than needing Robitussin. RN CM explained that decision would be made by ED doctor regarding medications and hospital admission. Patient was difficult to follow and was unable to stay focused during conversation. Patient walked away from RN CM and started pacing the floor stating she had no more questions. RN CM signing off.     Barriers to Discharge: Unsafe home situation   Patient Goals and CMS Choice Patient states their goals for this hospitalization and ongoing recovery are:: Get admitted and get Robitussin for her Pneumonia      Expected Discharge Plan and Services                                                Prior Living Arrangements/Services                       Activities of Daily Living      Permission Sought/Granted                  Emotional  Assessment              Admission diagnosis:  dizziness ems Patient Active Problem List   Diagnosis Date Noted  . Adjustment disorder   . MDD (major depressive disorder), recurrent episode, severe (HCC) 08/31/2019  . Indication for care in labor and delivery, antepartum 08/29/2019  . Hydrops fetalis 08/29/2019  . Indication for care in labor or delivery 08/14/2019  . Dehydration 08/14/2019  . Rubella non-immune status, antepartum 08/01/2019  . Pain of round ligament affecting pregnancy, antepartum 08/01/2019  . Labor and delivery indication for care or intervention 07/30/2019  . Homelessness 07/25/2019  . Decreased fetal movement 07/02/2019  . Club foot, fetal, affecting care of mother, antepartum 06/29/2019  . Supervision of high risk pregnancy, antepartum 06/19/2019  . Cocaine abuse with cocaine-induced mood disorder (HCC) 04/15/2019  . 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/03/2018  . Bacterial vaginosis 12/25/2017  . PTSD (post-traumatic stress disorder) 09/02/2017  . Schizoaffective disorder (HCC) 09/01/2017  . MRSA carrier 07/18/2017  . Overdose 07/16/2017  . Aspiration pneumonia (HCC) 07/16/2017  . Acute respiratory failure (HCC) 07/16/2017  . Polysubstance abuse (HCC) 07/16/2017  .  Eczema 05/08/2017  . Borderline personality disorder (HCC) 11/06/2016  . Cocaine use disorder, severe, dependence (HCC) 11/06/2016  . Cannabis use disorder, moderate, dependence (HCC) 11/06/2016  . Alcohol use disorder, mild, abuse 11/06/2016  . Asthma 09/23/2011  . Tobacco use disorder 09/15/2009   PCP:  Patient, No Pcp Per Pharmacy:   [facility] [phone] [address], [place] - [address] AT [facility] [address] [place] [address] Phone: [phone] Fax: [phone]     Social Determinants of Health (SDOH) Interventions    Readmission Risk Interventions No flowsheet data found.

## 2020-06-27 NOTE — ED Notes (Signed)
Pt with no change in condition, will continue to monitor.

## 2020-06-27 NOTE — ED Notes (Signed)
Pt given a warm blanket 

## 2020-06-27 NOTE — ED Notes (Signed)
Pt up to bathroom without difficulty, no complaints at this time.

## 2020-06-27 NOTE — ED Provider Notes (Signed)
-----------------------------------------   2:33 PM on 06/27/2020 -----------------------------------------  Patient evaluated by social work and provided with resources for shelters as well as free food options.  She is appropriate for discharge at this time.   Chesley Noon, MD 06/27/20 [phone]

## 2020-07-10 ENCOUNTER — Other Ambulatory Visit: Payer: Self-pay

## 2020-07-10 ENCOUNTER — Emergency Department
Admission: EM | Admit: 2020-07-10 | Discharge: 2020-07-11 | Disposition: A | Discharge status: Home / Self Care | Payer: Medicaid Other | Attending: Emergency Medicine | Admitting: Emergency Medicine

## 2020-07-10 ENCOUNTER — Encounter: Payer: Self-pay | Admitting: Emergency Medicine

## 2020-07-10 ENCOUNTER — Ambulatory Visit
Admission: EM | Admit: 2020-07-10 | Discharge: 2020-07-10 | Disposition: A | Discharge status: Home / Self Care | Payer: No Typology Code available for payment source | Attending: Emergency Medicine | Admitting: Emergency Medicine

## 2020-07-10 DIAGNOSIS — Z20822 Contact with and (suspected) exposure to covid-19: Secondary | ICD-10-CM | POA: Diagnosis not present

## 2020-07-10 DIAGNOSIS — R102 Pelvic and perineal pain: Secondary | ICD-10-CM | POA: Diagnosis not present

## 2020-07-10 DIAGNOSIS — F1420 Cocaine dependence, uncomplicated: Secondary | ICD-10-CM | POA: Diagnosis present

## 2020-07-10 DIAGNOSIS — F332 Major depressive disorder, recurrent severe without psychotic features: Secondary | ICD-10-CM | POA: Diagnosis present

## 2020-07-10 DIAGNOSIS — F603 Borderline personality disorder: Secondary | ICD-10-CM | POA: Diagnosis present

## 2020-07-10 DIAGNOSIS — T50901A Poisoning by unspecified drugs, medicaments and biological substances, accidental (unintentional), initial encounter: Secondary | ICD-10-CM | POA: Diagnosis present

## 2020-07-10 DIAGNOSIS — F1414 Cocaine abuse with cocaine-induced mood disorder: Secondary | ICD-10-CM | POA: Diagnosis present

## 2020-07-10 DIAGNOSIS — Z0441 Encounter for examination and observation following alleged adult rape: Secondary | ICD-10-CM | POA: Insufficient documentation

## 2020-07-10 DIAGNOSIS — Z5900 Homelessness unspecified: Secondary | ICD-10-CM

## 2020-07-10 DIAGNOSIS — F1010 Alcohol abuse, uncomplicated: Secondary | ICD-10-CM | POA: Diagnosis present

## 2020-07-10 DIAGNOSIS — F1220 Cannabis dependence, uncomplicated: Secondary | ICD-10-CM | POA: Diagnosis present

## 2020-07-10 DIAGNOSIS — J45909 Unspecified asthma, uncomplicated: Secondary | ICD-10-CM | POA: Diagnosis not present

## 2020-07-10 DIAGNOSIS — F4320 Adjustment disorder, unspecified: Secondary | ICD-10-CM | POA: Diagnosis present

## 2020-07-10 DIAGNOSIS — F1910 Other psychoactive substance abuse, uncomplicated: Secondary | ICD-10-CM | POA: Diagnosis present

## 2020-07-10 LAB — URINE DRUG SCREEN, QUALITATIVE (ARMC ONLY)
Amphetamines, Ur Screen: NOT DETECTED
Barbiturates, Ur Screen: NOT DETECTED
Benzodiazepine, Ur Scrn: POSITIVE — AB
Cannabinoid 50 Ng, Ur ~~LOC~~: NOT DETECTED
Cocaine Metabolite,Ur ~~LOC~~: POSITIVE — AB
MDMA (Ecstasy)Ur Screen: NOT DETECTED
Methadone Scn, Ur: NOT DETECTED
Opiate, Ur Screen: NOT DETECTED
Phencyclidine (PCP) Ur S: NOT DETECTED
Tricyclic, Ur Screen: NOT DETECTED

## 2020-07-10 LAB — SARS CORONAVIRUS 2 BY RT PCR (HOSPITAL ORDER, PERFORMED IN ~~LOC~~ HOSPITAL LAB): SARS Coronavirus 2: NEGATIVE

## 2020-07-10 LAB — POCT PREGNANCY, URINE: Preg Test, Ur: NEGATIVE

## 2020-07-10 MED ORDER — LIDOCAINE HCL (PF) 1 % IJ SOLN
0.9000 mL | Freq: Once | INTRAMUSCULAR | Status: AC
  Administered 2020-07-11: 0.9 mL
  Filled 2020-07-10: qty 5

## 2020-07-10 MED ORDER — MIDAZOLAM HCL 2 MG/2ML IJ SOLN
INTRAMUSCULAR | Status: AC
  Administered 2020-07-10: 2 mg
  Filled 2020-07-10: qty 4

## 2020-07-10 MED ORDER — HALOPERIDOL LACTATE 5 MG/ML IJ SOLN: 10.0000 mg | Freq: Once | INTRAMUSCULAR | Status: DC

## 2020-07-10 MED ORDER — DIPHENHYDRAMINE HCL 50 MG/ML IJ SOLN
25.0000 mg | Freq: Once | INTRAMUSCULAR | Status: AC
  Administered 2020-07-10: 25 mg via INTRAMUSCULAR

## 2020-07-10 MED ORDER — HALOPERIDOL LACTATE 5 MG/ML IJ SOLN
INTRAMUSCULAR | Status: AC
  Administered 2020-07-10: 5 mg
  Filled 2020-07-10: qty 1

## 2020-07-10 MED ORDER — DIPHENHYDRAMINE HCL 50 MG/ML IJ SOLN
INTRAMUSCULAR | Status: AC
  Administered 2020-07-10: 50 mg via INTRAMUSCULAR
  Filled 2020-07-10: qty 1

## 2020-07-10 MED ORDER — DIPHENHYDRAMINE HCL 50 MG/ML IJ SOLN: 50.0000 mg | Freq: Once | INTRAMUSCULAR | Status: AC

## 2020-07-10 MED ORDER — AZITHROMYCIN 500 MG PO TABS
1000.0000 mg | ORAL_TABLET | Freq: Once | ORAL | Status: AC
  Administered 2020-07-11: 1000 mg via ORAL
  Filled 2020-07-10: qty 2

## 2020-07-10 MED ORDER — HALOPERIDOL LACTATE 5 MG/ML IJ SOLN: 5.0000 mg | Freq: Once | INTRAMUSCULAR | Status: AC

## 2020-07-10 MED ORDER — LORAZEPAM 2 MG/ML IJ SOLN: 2.0000 mg | Freq: Once | INTRAMUSCULAR | Status: DC

## 2020-07-10 MED ORDER — ULIPRISTAL ACETATE 30 MG PO TABS
30.0000 mg | ORAL_TABLET | Freq: Once | ORAL | Status: AC
  Administered 2020-07-11: 30 mg via ORAL
  Filled 2020-07-10: qty 1

## 2020-07-10 MED ORDER — CEFTRIAXONE SODIUM 1 G IJ SOLR
500.0000 mg | Freq: Once | INTRAMUSCULAR | Status: AC
  Administered 2020-07-11: 500 mg via INTRAMUSCULAR
  Filled 2020-07-10: qty 10

## 2020-07-10 MED ORDER — MIDAZOLAM HCL 2 MG/2ML IJ SOLN: 4.0000 mg | Freq: Once | INTRAMUSCULAR | Status: AC

## 2020-07-10 MED ORDER — PROMETHAZINE HCL 25 MG PO TABS: 25.0000 mg | ORAL_TABLET | Freq: Four times a day (QID) | ORAL | Status: DC | PRN

## 2020-07-10 NOTE — ED Notes (Signed)
Charge RN called this RN and reports [facility] had taken out IVC papers. Pt will be taken to psych room. This RN was not informed of patients IVC until now.

## 2020-07-10 NOTE — Discharge Instructions (Signed)
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  blockage in your bowel  diabetes  glaucoma  have trouble controlling your muscles  heart disease  liver disease  low blood counts, like low white cell, platelet, or red cell counts  lung or breathing disease, like asthma  Parkinson's disease  prostate disease  seizures  stomach or intestine problems  trouble passing urine  an unusual or allergic reaction to promethazine, sulfites, other medicines, foods, dyes, or preservatives  pregnant or trying to get pregnant  breast-feeding How should I use this medicine? Take this medicine by mouth with a glass of water. Follow the directions on the prescription label. Take your doses at regular intervals. Do not take your medicine more often than directed. Talk to your pediatrician regarding the use of this  medicine in children. Special care may be needed. This medicine should not be given to infants and children younger than 93 years old. Overdosage: If you think you have taken too much of this medicine contact a poison control center or emergency room at once. NOTE: This medicine is only for you. Do not share this medicine with others. What if I miss a dose? If you miss a dose, take it as soon as you can. If it is almost time for your next dose, take only that dose. Do not take double or extra doses. What may interact with this medicine?  alcohol  antihistamines for allergy, cough, and cold  atropine  certain medicines for anxiety or sleep  certain medicines for bladder problems like oxybutynin, tolterodine  certain medicines for depression like amitriptyline, fluoxetine, sertraline  certain medicines for Parkinson's disease like benztropine, trihexyphenidyl  certain medicines for stomach problems like dicyclomine, hyoscyamine  certain medicines for travel sickness like scopolamine  epinephrine  general anesthetics like halothane, isoflurane, methoxyflurane, propofol  ipratropium  MAOIs like Marplan, Nardil, and Parnate  medicines for high blood pressure  medicines for seizures like phenobarbital, primidone, phenytoin  medicines that relax muscles for surgery  metoclopramide  narcotic medicines for pain This list may not describe all possible interactions. Give your health care provider a list of all the medicines, herbs, non-prescription drugs, or dietary supplements you use. Also tell them if you smoke, drink alcohol, or use illegal drugs. Some items may interact with your medicine. What should I watch for while using this medicine? Visit your health care professional for regular checks on your progress. Tell your health care professional if symptoms do not start to get better or if they get worse. You may get drowsy or dizzy. Do not drive, use machinery, or do anything  that needs mental alertness until you know how this medicine affects you. To reduce the risk of dizzy or fainting spells, do not stand or sit up quickly, especially if you are an older patient. Alcohol may increase dizziness and drowsiness. Avoid alcoholic drinks. Your mouth may get dry. Chewing sugarless gum or sucking hard candy, and drinking plenty of water may help. Contact your doctor if the problem does not go away or is severe. This medicine may cause dry eyes and blurred vision. If you wear contact lenses you may feel some discomfort. Lubricating drops may help. See your eye doctor if the problem does not go away or is severe. This medicine can make you more sensitive to the sun. Keep out of the sun. If you  cannot avoid being in the sun, wear protective clothing and use sunscreen. Do not use sun lamps or tanning beds/booths. This medicine may increase blood sugar. Ask your health care provider if changes in diet or medicines are needed if you have diabetes. What side effects may I notice from receiving this medicine? Side effects that you should report to your doctor or health care professional as soon as possible:  allergic reactions like skin rash, itching or hives, swelling of the face, lips, or tongue  breathing problems  changes in vision  confusion  fever, chills, sore throat  pain, redness, or irritation at site where injected  seizures  signs and symptoms of high blood sugar such as being more thirsty or hungry or having to urinate more than normal. You may also feel very tired or have blurry vision.  signs and symptoms of liver injury like dark yellow or brown urine; general ill feeling or flu-like symptoms; light-colored stools; loss of appetite; nausea; right upper belly pain; unusually weak or tired; yellowing of the eyes or skin  signs and symptoms of low blood pressure like dizziness; feeling faint or lightheaded, falls; unusually weak or tired  trouble passing urine  or change in the amount of urine  trouble swallowing  uncontrollable movements of the arms, face, head, mouth, neck, or upper body  unusual bruising or bleeding  unusually weak or tired Side effects that usually do not require medical attention (report to your doctor or health care professional if they continue or are bothersome):  drowsiness  dry mouth  restlessness This list may not describe all possible side effects. Call your doctor for medical advice about side effects. You may report side effects to FDA at 1-800-FDA-1088. Where should I keep my medicine? Keep out of the reach of children. Store at room temperature, between 20 and 25 degrees C (68 and 77 degrees F). Protect from light. Throw away any unused medicine after the expiration date. NOTE: This sheet is a summary. It may not cover all possible information. If you have questions about this medicine, talk to your doctor, pharmacist, or health care provider.  2020 Elsevier/Gold Standard (2019-10-24 13:27:34)   Ceftriaxone (Injection) Also known as:  Rocephin  Ceftriaxone Injection What is this medicine? CEFTRIAXONE (sef try AX one) is a cephalosporin antibiotic. It treats some infections caused by bacteria. It will not work for colds, the flu, or other viruses. This medicine may be used for other purposes; ask your health care provider or pharmacist if you have questions. COMMON BRAND NAME(S): Ceftrisol Plus, Rocephin What should I tell my health care provider before I take this medicine? They need to know if you have any of these conditions:  any chronic illness  bowel disease, like colitis  both kidney and liver disease  high bilirubin level in newborn patients  an unusual or allergic reaction to ceftriaxone, other cephalosporin or penicillin antibiotics, foods, dyes, or preservatives  pregnant or trying to get pregnant  breast-feeding How should I use this medicine? This drug is injected into a muscle  or a vein. It is usually given by a health care provider in a hospital or clinic setting. If you get this drug at home, you will be taught how to prepare and give it. Use exactly as directed. Take it as directed on the prescription label at the same time every day. Keep taking it unless your health care provider tells you to stop. It is important that you put your used needles and  syringes in a special sharps container. Do not put them in a trash can. If you do not have a sharps container, call your pharmacist or health care provider to get one. Talk to your health care provider about the use of this drug in children. While it may be prescribed for children as young as newborns for selected conditions, precautions do apply. Overdosage: If you think you have taken too much of this medicine contact a poison control center or emergency room at once. NOTE: This medicine is only for you. Do not share this medicine with others. What if I miss a dose? It is important not to miss your dose. Call your health care provider if you are unable to keep an appointment. If you give yourself this drug at home and you miss a dose, take it as soon as you can. If it is almost time for your next dose, take only that dose. Do not take double or extra doses. What may interact with this medicine? Do not take this medicine with any of the following medications:  intravenous calcium This medicine may also interact with the following medications:  birth control pills This list may not describe all possible interactions. Give your health care provider a list of all the medicines, herbs, non-prescription drugs, or dietary supplements you use. Also tell them if you smoke, drink alcohol, or use illegal drugs. Some items may interact with your medicine. What should I watch for while using this medicine? Tell your doctor or health care provider if your symptoms do not improve or if they get worse. This medicine may cause serious  skin reactions. They can happen weeks to months after starting the medicine. Contact your health care provider right away if you notice fevers or flu-like symptoms with a rash. The rash may be red or purple and then turn into blisters or peeling of the skin. Or, you might notice a red rash with swelling of the face, lips or lymph nodes in your neck or under your arms. Do not treat diarrhea with over the counter products. Contact your doctor if you have diarrhea that lasts more than 2 days or if it is severe and watery. If you are being treated for a sexually transmitted disease, avoid sexual contact until you have finished your treatment. Having sex can infect your sexual partner. Calcium may bind to this medicine and cause lung or kidney problems. Avoid calcium products while taking this medicine and for 48 hours after taking the last dose of this medicine. What side effects may I notice from receiving this medicine? Side effects that you should report to your doctor or health care professional as soon as possible:  allergic reactions like skin rash, itching or hives, swelling of the face, lips, or tongue  breathing problems  fever, chills  irregular heartbeat  pain when passing urine  redness, blistering, peeling, or loosening of the skin, including inside the mouth  seizures  stomach pain, cramps  unusual bleeding, bruising  unusually weak or tired Side effects that usually do not require medical attention (report to your doctor or health care professional if they continue or are bothersome):  diarrhea  dizzy, drowsy  headache  nausea, vomiting  pain, swelling, irritation where injected  stomach upset  sweating This list may not describe all possible side effects. Call your doctor for medical advice about side effects. You may report side effects to FDA at 1-800-FDA-1088. Where should I keep my medicine? Keep out of the reach  of children and pets. You will be instructed  on how to store this drug. Protect from light. Throw away any unused drug after the expiration date. NOTE: This sheet is a summary. It may not cover all possible information. If you have questions about this medicine, talk to your doctor, pharmacist, or health care provider.  2020 Elsevier/Gold Standard (2019-07-21 18:29:21)  .sane
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
## 2020-07-10 NOTE — ED Notes (Signed)
Patient currently resting with eyes closed. Unable to assess at this time.

## 2020-07-10 NOTE — ED Triage Notes (Signed)
Pt returned to ED eating bag of doritos.  Pt states "I had to go get something".  Informed patient cannot be leaving ED, next time will be taken off board and have to check in again and start waiting all over.

## 2020-07-10 NOTE — BH Assessment (Signed)
TTS attempted to complete assessment with pt and was unable to due to pt IM's administered. Pt presented restless and was unable to participate in the assessment. TTS will try again later.
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
## 2020-07-10 NOTE — SANE Note (Signed)
Informed ED staff that there was another case at [facility] main campus, and that I was leaving.  RN notified to call us back when pt is awake and alert to talk to the [facility] manager.
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
## 2020-07-10 NOTE — SANE Note (Signed)
Spoke with pts RN, Jeanett Schlein, RN who states pt is still sleeping, and that "she will be out for a while, at least she usually is when she comes in here and we medicate her, so I don't know when you will be able to do her kit"  I left my name and contact information for the RN to call when she wakes up enough for an FNE consult.

## 2020-07-10 NOTE — ED Notes (Addendum)
Went to round and check on pt, pt no longer in room.  Pt seen by screener walking in front of [facility]. Will leave on board for short time to see if returns.

## 2020-07-10 NOTE — ED Notes (Signed)
TTS provider Aqua at bedside at this time.
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
## 2020-07-10 NOTE — ED Notes (Signed)

## 2020-07-10 NOTE — ED Notes (Signed)
2 staff members from [facility] that also work with [employer] here to see patient to offer resources. Did have ID badges on.  Pt gave verbal permission to this RN for them to speak with her.  Pt asking why she is waiting, explained the process and reason for wait.  Pt states "is the SANE nurse here"?  Informed pt that SANE RN is at another facility and will be here as soon as possible. Patient states "well I may just go to the [facility]".  NAD.  [facility] staff remains in family room to speak with pt.
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
## 2020-07-10 NOTE — ED Notes (Addendum)
Pt given cup of sprite.  

## 2020-07-10 NOTE — ED Notes (Signed)
SANE in with patient preforming exam at this time.

## 2020-07-10 NOTE — ED Notes (Signed)
covid swab obtained and sent
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
## 2020-07-10 NOTE — ED Notes (Addendum)
Officer phillips with pt, pt to be triaged after he is done speaking with pt

## 2020-07-10 NOTE — SANE Note (Signed)
Arrived to [facility] for SANE consult.  Pt was recently medicated and I am unable to assess or speak with pt at this time due to extreme drowsiness.

## 2020-07-10 NOTE — ED Notes (Signed)
Pt ambulated to the bathroom. Hand hygiene preformed.
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
## 2020-07-10 NOTE — ED Triage Notes (Signed)
EDP [place] to Family Wait to medically clear patient. Prior to EDP to Family wait to eval patient, pt requesting pain medication and something to eat. This RN explained nothing to eat until after evaluated by SANE RN, EDP would have to evaluate for pain medication. This RN notified by EDP that upon his arrival to room patient eating chocolate pudding in family wait despite this RN explaining to patient nothing to eat/drink.   This RN notified Jasmine December, SANE RN that patient requesting exam/eval.

## 2020-07-10 NOTE — ED Notes (Signed)
Dinner provided.
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
## 2020-07-11 NOTE — ED Provider Notes (Signed)
Patient's IVC rescinded by psychiatry.   Bridget Semen, MD 07/11/20 [phone]

## 2020-07-12 ENCOUNTER — Telehealth: Payer: Self-pay | Admitting: General Practice

## 2020-07-12 NOTE — Telephone Encounter (Signed)
Individual has been contacted regarding ED referral. She has medicaid and is ineligible to become a pt in the clinic.
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
## 2020-08-16 IMAGING — CR DG CHEST 2V
1 series · 2 of 2 positions shown · non-contrast
Comparison: 08/28/2018

CLINICAL DATA: Shortness of breath

EXAM:
CHEST - 2 VIEW

[Series 1: dg chest 2 view · 0.14mm/px · 2 of 2 slices shown]
[im 1/2]
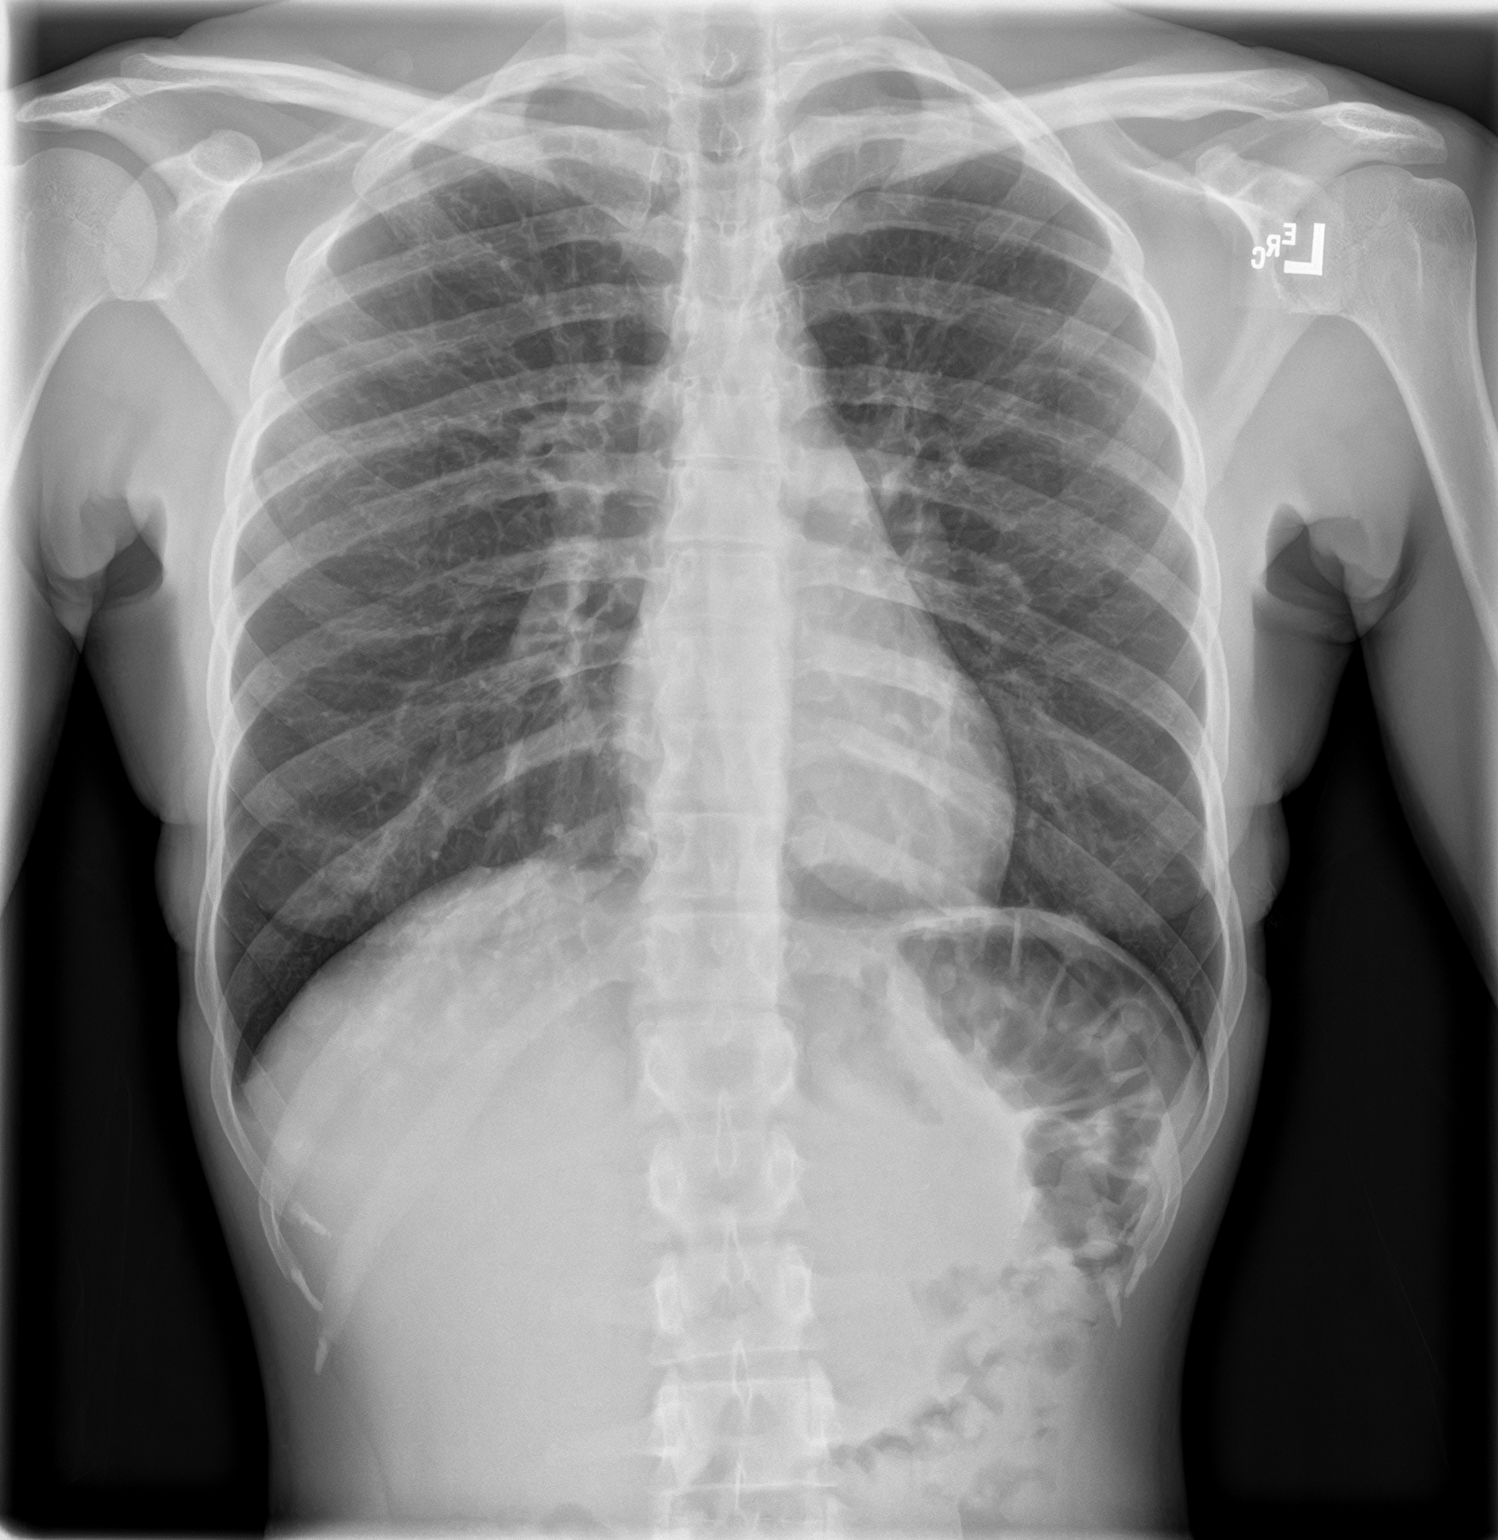
[im 2/2]
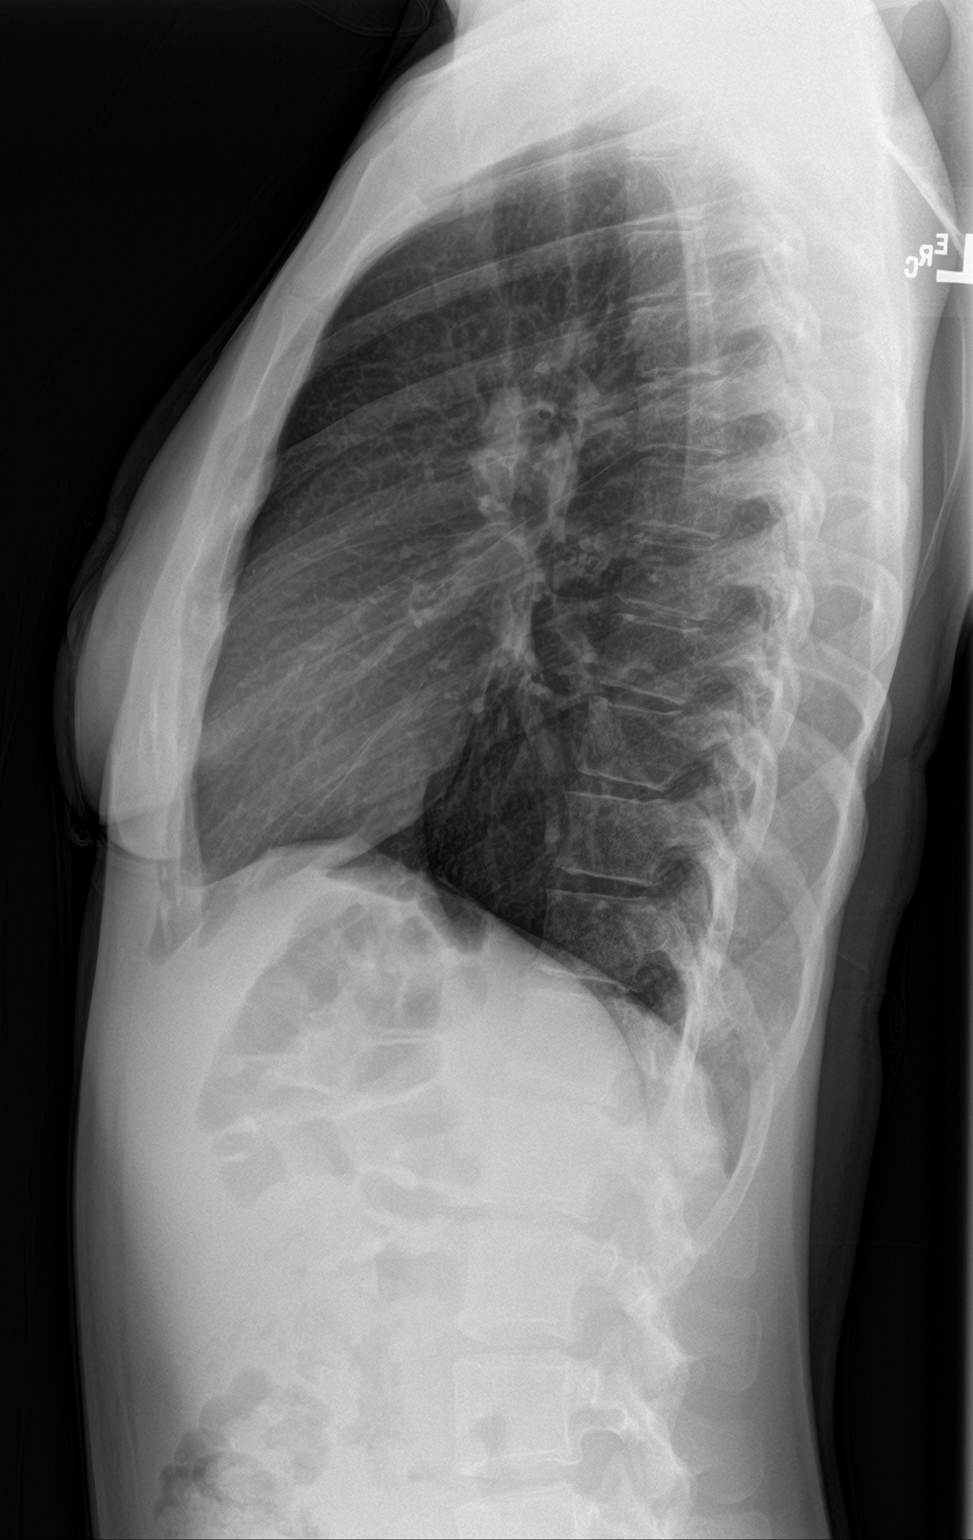

[2 of 2 positions shown; findings below may reference images not displayed]

FINDINGS: Frontal and lateral views of the chest demonstrate a stable cardiac
silhouette. No airspace disease, effusion, or pneumothorax. No acute
bony abnormalities.
IMPRESSION: 1. No acute intrathoracic process.

## 2020-08-18 IMAGING — CR DG CHEST 2V
1 series · 2 of 2 positions shown · non-contrast
Comparison: 06/24/2020

CLINICAL DATA: Dizziness and near syncopal episode today

EXAM:
CHEST - 2 VIEW

[Series 1: dg chest 2 view · 0.14mm/px · 2 of 2 slices shown]
[im 1/2]
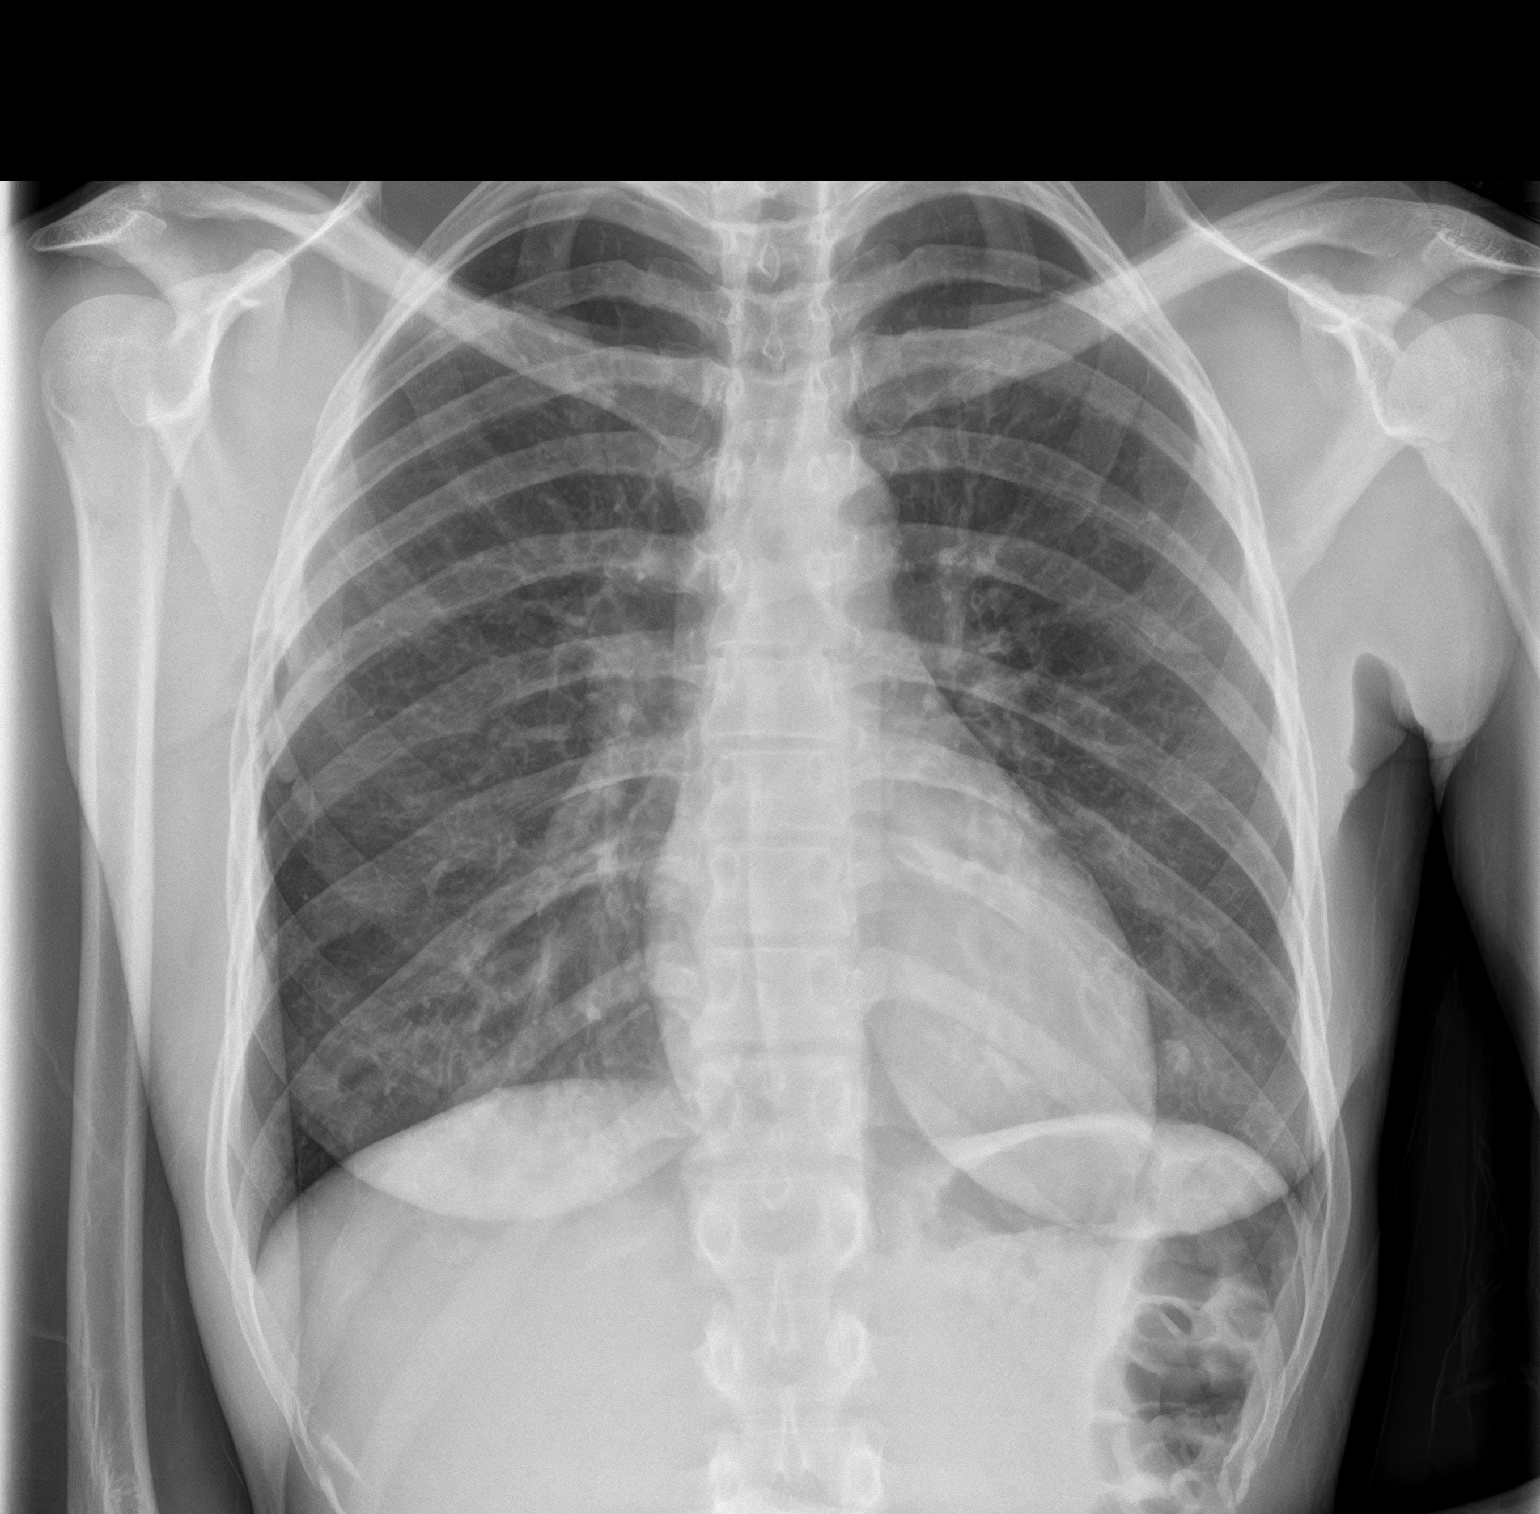
[im 2/2]
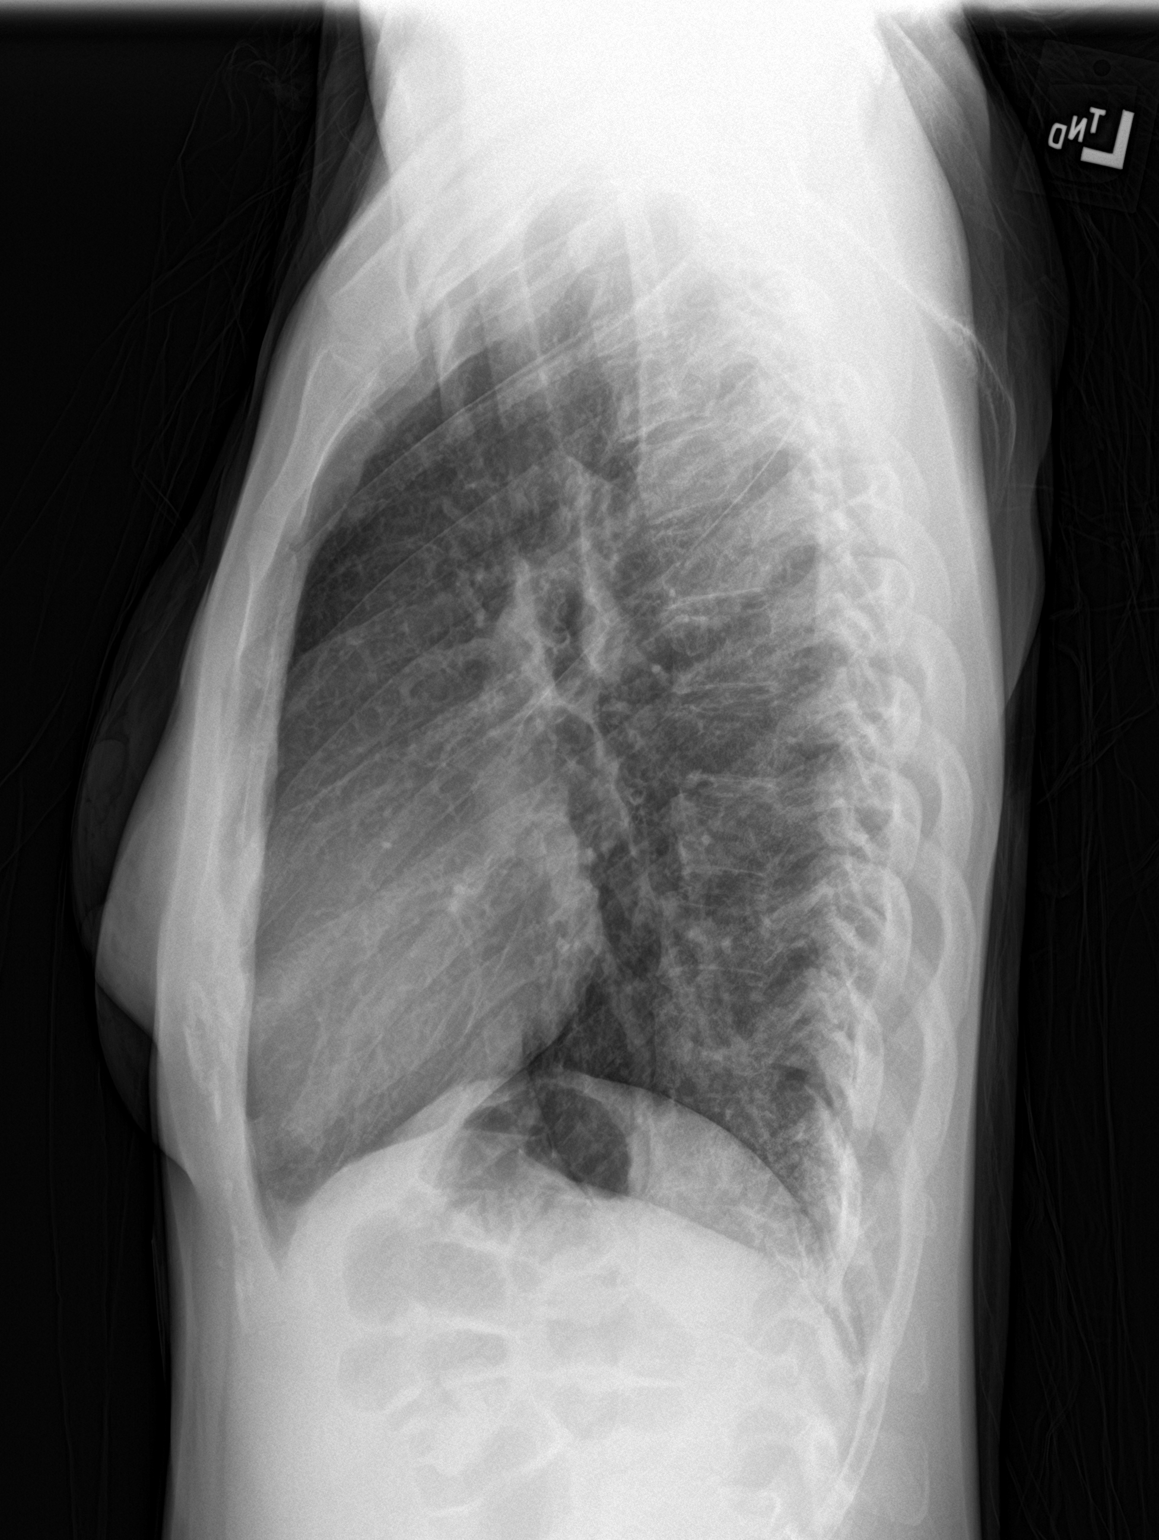

[2 of 2 positions shown; findings below may reference images not displayed]

FINDINGS: Cardiac shadows within normal limits. The lungs are well aerated
bilaterally. No focal infiltrate or sizable effusion is seen. Nipple
shadow is noted over the left base. No new focal abnormality is
seen.
IMPRESSION: No acute abnormality noted.

## 2020-09-01 ENCOUNTER — Emergency Department
Admission: EM | Admit: 2020-09-01 | Discharge: 2020-09-01 | Disposition: A | Discharge status: Home / Self Care | Payer: Medicaid Other | Attending: Emergency Medicine | Admitting: Emergency Medicine

## 2020-09-01 ENCOUNTER — Other Ambulatory Visit: Payer: Self-pay

## 2020-09-01 DIAGNOSIS — Y939 Activity, unspecified: Secondary | ICD-10-CM | POA: Insufficient documentation

## 2020-09-01 DIAGNOSIS — Y999 Unspecified external cause status: Secondary | ICD-10-CM | POA: Diagnosis not present

## 2020-09-01 DIAGNOSIS — S0081XA Abrasion of other part of head, initial encounter: Secondary | ICD-10-CM | POA: Insufficient documentation

## 2020-09-01 DIAGNOSIS — Y929 Unspecified place or not applicable: Secondary | ICD-10-CM | POA: Diagnosis not present

## 2020-09-01 DIAGNOSIS — S80211A Abrasion, right knee, initial encounter: Secondary | ICD-10-CM | POA: Insufficient documentation

## 2020-09-01 DIAGNOSIS — S80212A Abrasion, left knee, initial encounter: Secondary | ICD-10-CM | POA: Diagnosis not present

## 2020-09-01 DIAGNOSIS — W540XXA Bitten by dog, initial encounter: Secondary | ICD-10-CM | POA: Insufficient documentation

## 2020-09-01 DIAGNOSIS — Z23 Encounter for immunization: Secondary | ICD-10-CM | POA: Diagnosis not present

## 2020-09-01 DIAGNOSIS — S41151A Open bite of right upper arm, initial encounter: Secondary | ICD-10-CM | POA: Diagnosis present

## 2020-09-01 DIAGNOSIS — S41051A Open bite of right shoulder, initial encounter: Secondary | ICD-10-CM | POA: Diagnosis not present

## 2020-09-01 DIAGNOSIS — J45909 Unspecified asthma, uncomplicated: Secondary | ICD-10-CM | POA: Diagnosis not present

## 2020-09-01 DIAGNOSIS — S60511A Abrasion of right hand, initial encounter: Secondary | ICD-10-CM | POA: Insufficient documentation

## 2020-09-01 DIAGNOSIS — S60512A Abrasion of left hand, initial encounter: Secondary | ICD-10-CM | POA: Insufficient documentation

## 2020-09-01 DIAGNOSIS — S90812A Abrasion, left foot, initial encounter: Secondary | ICD-10-CM | POA: Diagnosis not present

## 2020-09-01 MED ORDER — TETANUS-DIPHTH-ACELL PERTUSSIS 5-2.5-18.5 LF-MCG/0.5 IM SUSP
0.5000 mL | Freq: Once | INTRAMUSCULAR | Status: AC
  Administered 2020-09-01: 0.5 mL via INTRAMUSCULAR
  Filled 2020-09-01: qty 0.5

## 2020-09-01 MED ORDER — BACITRACIN ZINC 500 UNIT/GM EX OINT: TOPICAL_OINTMENT | Freq: Once | CUTANEOUS | Status: DC

## 2020-09-01 MED ORDER — AMOXICILLIN-POT CLAVULANATE 875-125 MG PO TABS
1.0000 | ORAL_TABLET | Freq: Two times a day (BID) | ORAL | 0 refills | Status: DC
Start: 2020-09-01 — End: 2020-10-19

## 2020-09-01 MED ORDER — RABIES IMMUNE GLOBULIN 150 UNIT/ML IM INJ
20.0000 [IU]/kg | INJECTION | Freq: Once | INTRAMUSCULAR | Status: AC
  Administered 2020-09-01: 1125 [IU] via INTRAMUSCULAR
  Filled 2020-09-01: qty 8

## 2020-09-01 MED ORDER — RABIES VACCINE, PCEC IM SUSR
1.0000 mL | Freq: Once | INTRAMUSCULAR | Status: AC
  Administered 2020-09-01: 1 mL via INTRAMUSCULAR
  Filled 2020-09-01: qty 1
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
## 2020-09-08 ENCOUNTER — Emergency Department
Admission: EM | Admit: 2020-09-08 | Discharge: 2020-09-08 | Disposition: A | Discharge status: Home / Self Care | Payer: Medicaid Other | Attending: Emergency Medicine | Admitting: Emergency Medicine

## 2020-09-08 ENCOUNTER — Other Ambulatory Visit: Payer: Self-pay

## 2020-09-08 DIAGNOSIS — S80812A Abrasion, left lower leg, initial encounter: Secondary | ICD-10-CM | POA: Insufficient documentation

## 2020-09-08 DIAGNOSIS — W540XXA Bitten by dog, initial encounter: Secondary | ICD-10-CM | POA: Insufficient documentation

## 2020-09-08 DIAGNOSIS — Z87891 Personal history of nicotine dependence: Secondary | ICD-10-CM | POA: Insufficient documentation

## 2020-09-08 DIAGNOSIS — Y9289 Other specified places as the place of occurrence of the external cause: Secondary | ICD-10-CM | POA: Insufficient documentation

## 2020-09-08 DIAGNOSIS — Z79899 Other long term (current) drug therapy: Secondary | ICD-10-CM | POA: Insufficient documentation

## 2020-09-08 DIAGNOSIS — Y9389 Activity, other specified: Secondary | ICD-10-CM | POA: Diagnosis not present

## 2020-09-08 DIAGNOSIS — S81812A Laceration without foreign body, left lower leg, initial encounter: Secondary | ICD-10-CM | POA: Diagnosis present

## 2020-09-08 DIAGNOSIS — Y998 Other external cause status: Secondary | ICD-10-CM | POA: Insufficient documentation

## 2020-09-08 DIAGNOSIS — T148XXA Other injury of unspecified body region, initial encounter: Secondary | ICD-10-CM

## 2020-09-08 DIAGNOSIS — F1210 Cannabis abuse, uncomplicated: Secondary | ICD-10-CM | POA: Insufficient documentation

## 2020-09-08 DIAGNOSIS — S81832A Puncture wound without foreign body, left lower leg, initial encounter: Secondary | ICD-10-CM | POA: Insufficient documentation

## 2020-09-08 DIAGNOSIS — J45909 Unspecified asthma, uncomplicated: Secondary | ICD-10-CM | POA: Insufficient documentation

## 2020-09-08 NOTE — ED Notes (Signed)
[employer] [place] has been called at [phone] to report dog bite at [address], [place], owner Deloria Lair, date of bite 09/11/2020.  Officer will call the pt on her cell phone [phone] since pt is being discharged at this time.

## 2020-09-08 NOTE — Discharge Instructions (Addendum)
Be advised your tetanus booster is up-to-date, it was given on your last visit to this department.  Advised to pick up antibiotics that were prescribed on your last visit from [facility] on [address].  Animal control will be notified of your encounter with the dog.
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
## 2020-09-08 NOTE — ED Triage Notes (Addendum)
Pt arrives via EMS from home after she states that a dog bit her the other day and left lacerations on her- pt is convinced that it is infected- no evidence of infection noted- pt noted to have multiple scrapes on right knee, foot, and shoulder

## 2020-09-08 NOTE — ED Notes (Signed)
Pt to ED via [facility] for leg pain. Pt is in NAD
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
## 2020-09-25 ENCOUNTER — Emergency Department
Admission: EM | Admit: 2020-09-25 | Discharge: 2020-09-25 | Disposition: A | Discharge status: Home / Self Care | Payer: Medicaid Other | Attending: Emergency Medicine | Admitting: Emergency Medicine

## 2020-09-25 ENCOUNTER — Emergency Department: Payer: Medicaid Other

## 2020-09-25 ENCOUNTER — Encounter: Payer: Self-pay | Admitting: Emergency Medicine

## 2020-09-25 ENCOUNTER — Other Ambulatory Visit: Payer: Self-pay

## 2020-09-25 DIAGNOSIS — R0981 Nasal congestion: Secondary | ICD-10-CM | POA: Diagnosis not present

## 2020-09-25 DIAGNOSIS — Z87891 Personal history of nicotine dependence: Secondary | ICD-10-CM | POA: Diagnosis not present

## 2020-09-25 DIAGNOSIS — J45909 Unspecified asthma, uncomplicated: Secondary | ICD-10-CM | POA: Diagnosis not present

## 2020-09-25 DIAGNOSIS — R059 Cough, unspecified: Secondary | ICD-10-CM

## 2020-09-25 DIAGNOSIS — Z711 Person with feared health complaint in whom no diagnosis is made: Secondary | ICD-10-CM | POA: Diagnosis not present

## 2020-09-25 DIAGNOSIS — Z590 Homelessness: Secondary | ICD-10-CM | POA: Diagnosis not present

## 2020-09-25 DIAGNOSIS — Z20822 Contact with and (suspected) exposure to covid-19: Secondary | ICD-10-CM | POA: Insufficient documentation

## 2020-09-25 DIAGNOSIS — R05 Cough: Secondary | ICD-10-CM | POA: Diagnosis present

## 2020-09-25 DIAGNOSIS — R6883 Chills (without fever): Secondary | ICD-10-CM | POA: Insufficient documentation

## 2020-09-25 LAB — RESPIRATORY PANEL BY RT PCR (FLU A&B, COVID)
Influenza A by PCR: NEGATIVE
Influenza B by PCR: NEGATIVE
SARS Coronavirus 2 by RT PCR: NEGATIVE

## 2020-09-25 MED ORDER — BENZONATATE 100 MG PO CAPS
100.0000 mg | ORAL_CAPSULE | Freq: Once | ORAL | Status: AC
  Administered 2020-09-25: 100 mg via ORAL
  Filled 2020-09-25: qty 1

## 2020-09-25 NOTE — ED Notes (Signed)
Pt refused VS and e-signature. Pt left DC papers on ER stretcher. Pt stated "why wasn't I treated?" pt informed that she saw PA and MD and had multiple tests performed. Instructed pt that if she wasn't happy with care at this facility then she could follow up with [facility], [facility], [facility], etc. Pt walked out of room with steady gait noted and told this RN "God bless, have a good day."

## 2020-09-25 NOTE — ED Triage Notes (Signed)
Pt to ED via [facility] stating that she was bit by a dog on 8/24. Pt believes that she has an infection because she did not take treatment when she bit by the dog. Wound is completely healed with no signs of infection. Pt thinks that the infection is in her body.

## 2020-09-25 NOTE — ED Triage Notes (Signed)
Pt comes into the ED via EMS from home with c/o issues from a dog bite from a couple weeks ago,
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
## 2020-09-25 NOTE — ED Notes (Signed)
Pt presents to the ED after an animal bite. Pt states she was bit by a dog on 8/24 and states she believes she has an infection because now she is having worsening symptoms. Dog bite at this time has been healed.

## 2020-09-27 ENCOUNTER — Emergency Department
Admission: EM | Admit: 2020-09-27 | Discharge: 2020-09-27 | Disposition: A | Discharge status: Home / Self Care | Payer: Medicaid Other | Attending: Emergency Medicine | Admitting: Emergency Medicine

## 2020-09-27 ENCOUNTER — Emergency Department: Payer: Medicaid Other

## 2020-09-27 ENCOUNTER — Encounter: Payer: Self-pay | Admitting: Emergency Medicine

## 2020-09-27 ENCOUNTER — Other Ambulatory Visit: Payer: Self-pay

## 2020-09-27 DIAGNOSIS — J45909 Unspecified asthma, uncomplicated: Secondary | ICD-10-CM | POA: Diagnosis not present

## 2020-09-27 DIAGNOSIS — J40 Bronchitis, not specified as acute or chronic: Secondary | ICD-10-CM

## 2020-09-27 DIAGNOSIS — Z87891 Personal history of nicotine dependence: Secondary | ICD-10-CM | POA: Diagnosis not present

## 2020-09-27 DIAGNOSIS — R05 Cough: Secondary | ICD-10-CM | POA: Diagnosis present

## 2020-09-27 DIAGNOSIS — Z20822 Contact with and (suspected) exposure to covid-19: Secondary | ICD-10-CM | POA: Insufficient documentation

## 2020-09-27 LAB — RESPIRATORY PANEL BY RT PCR (FLU A&B, COVID)
Influenza A by PCR: NEGATIVE
Influenza B by PCR: NEGATIVE
SARS Coronavirus 2 by RT PCR: NEGATIVE

## 2020-09-27 MED ORDER — IBUPROFEN 400 MG PO TABS
400.0000 mg | ORAL_TABLET | Freq: Once | ORAL | Status: AC
  Administered 2020-09-27: 400 mg via ORAL
  Filled 2020-09-27: qty 1

## 2020-09-27 MED ORDER — PSEUDOEPHEDRINE HCL 30 MG PO TABS: 30.0000 mg | ORAL_TABLET | Freq: Four times a day (QID) | ORAL | 0 refills | Status: DC | PRN

## 2020-09-27 MED ORDER — NAPROXEN 500 MG PO TABS: 500.0000 mg | ORAL_TABLET | Freq: Two times a day (BID) | ORAL | 0 refills | Status: DC

## 2020-09-27 MED ORDER — NAPROXEN 500 MG PO TABS: 500.0000 mg | ORAL_TABLET | Freq: Two times a day (BID) | ORAL | 0 refills | Status: AC

## 2020-09-27 NOTE — ED Provider Notes (Addendum)
[facility] Emergency Department Provider Note   ____________________________________________   First MD Initiated Contact with Patient 09/27/20 [phone]     (approximate)  I have reviewed the triage vital signs and the nursing notes.   HISTORY  Chief Complaint Headache, Nasal Congestion, and Cough    HPI [name] with past medical history of schizoaffective disorder, asthma, and polysubstance abuse who presents to the ED for congestion and cough.  Patient reports for the past couple of days she has had increasing productive cough, soreness in her chest, nasal congestion, and difficulty breathing.  She is not aware of any fevers or sick contacts.  She has not noted any nausea or vomiting, denies diarrhea, dysuria, or hematuria.  She does endorse a headache similar to prior migraines, denies any numbness or weakness.        Past Medical History:  Diagnosis Date  . Asthma   . Depression 2009   Inpatient psych admission for SI, dissociative fugue  . Dissociative disorder or reaction 2009  . Eczema   . H/O: suicide attempt   . ODD (oppositional defiant disorder)   . PTSD (post-traumatic stress disorder)   . Schizoaffective disorder (HCC)   . Substance abuse [facility])     Patient Active Problem List   Diagnosis Date Noted  . Adjustment disorder   . MDD (major depressive disorder), recurrent episode, severe (HCC) 08/31/2019  . Indication for care in labor and delivery, antepartum 08/29/2019  . Hydrops fetalis 08/29/2019  . Indication for care in labor or delivery 08/14/2019  . Dehydration 08/14/2019  . Rubella non-immune status, antepartum 08/01/2019  . Pain of round ligament affecting pregnancy, antepartum 08/01/2019  . Labor and delivery indication for care or intervention 07/30/2019  . Homelessness 07/25/2019  . Decreased fetal movement 07/02/2019  . Club foot, fetal, affecting care of mother, antepartum 06/29/2019  .  Supervision of high risk pregnancy, antepartum 06/19/2019  . Cocaine abuse with cocaine-induced mood disorder (HCC) 04/15/2019  . 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/03/2018  . Bacterial vaginosis 12/25/2017  . PTSD (post-traumatic stress disorder) 09/02/2017  . Schizoaffective disorder (HCC) 09/01/2017  . MRSA carrier 07/18/2017  . Overdose 07/16/2017  . Aspiration pneumonia (HCC) 07/16/2017  . Acute respiratory failure (HCC) 07/16/2017  . Polysubstance abuse (HCC) 07/16/2017  . Eczema 05/08/2017  . Borderline personality disorder (HCC) 11/06/2016  . Cocaine use disorder, severe, dependence (HCC) 11/06/2016  . Cannabis use disorder, moderate, dependence (HCC) 11/06/2016  . Alcohol use disorder, mild, abuse 11/06/2016  . Asthma 09/23/2011  . Tobacco use disorder 09/15/2009    Past Surgical History:  Procedure Laterality Date  . ADENOIDECTOMY    . TONSILLECTOMY      Prior to Admission medications   Medication Sig Start Date End Date Taking? Authorizing Provider  amoxicillin-clavulanate (AUGMENTIN) 875-125 MG tablet Take 1 tablet by mouth 2 (two) times daily. 09/01/20   Cuthriell, Delorise Royals, PA-C  naproxen (NAPROSYN) 500 MG tablet Take 1 tablet (500 mg total) by mouth 2 (two) times daily with a meal for 6 days. 09/27/20 10/03/20  Chesley Noon, MD  pseudoephedrine (SUDAFED) 30 MG tablet Take 1 tablet (30 mg total) by mouth every 6 (six) hours as needed for congestion. 09/27/20 09/27/21  Chesley Noon, MD  risperiDONE (RISPERDAL) 2 MG tablet Take 2 mg by mouth at bedtime. 06/04/20   [provider]    Allergies Banana, Cantaloupe (diagnostic), Grapefruit extract, Albumin human, and Collard greens [lactuca virosa]  Family History  Adopted: Yes  Problem Relation Age of Onset  . Mental illness Mother   . Mental illness Father   . Heart disease Maternal Grandmother   . Sickle cell trait Adoptive Mother   . Cleft palate Cousin        maternal side    Social  History Social History   Tobacco Use  . Smoking status: Former Smoker    Packs/day: 0.75    Years: 2.00    Pack years: 1.50    Types: Cigarettes  . Smokeless tobacco: Never Used  . Tobacco comment: pt states she is quitting today  Vaping Use  . Vaping Use: Never used  Substance Use Topics  . Alcohol use: Yes    Alcohol/week: 1.0 standard drink    Types: 1 Cans of beer per week  . Drug use: Yes    Types: Marijuana, "Crack" cocaine, Heroin    Review of Systems  Constitutional: No fever/chills Eyes: No visual changes. ENT: Positive for congestion and sore throat. Cardiovascular: Positive for chest pain. Respiratory: Positive for cough and shortness of breath. Gastrointestinal: No abdominal pain.  No nausea, no vomiting.  No diarrhea.  No constipation. Genitourinary: Negative for dysuria. Musculoskeletal: Negative for back pain. Skin: Negative for rash. Neurological: Negative for headaches, focal weakness or numbness.  ____________________________________________   PHYSICAL EXAM:  VITAL SIGNS: ED Triage Vitals  Enc Vitals Group     BP 09/27/20 0207 110/62     Pulse Rate 09/27/20 0207 99     Resp 09/27/20 0207 18     Temp 09/27/20 0207 98.4 F (36.9 C)     Temp Source 09/27/20 0207 Oral     SpO2 09/27/20 0207 99 %     Weight 09/27/20 0209 130 lb (59 kg)     Height 09/27/20 0209 5\' 7"  (1.702 m)     Head Circumference --      Peak Flow --      Pain Score 09/27/20 0208 9     Pain Loc --      Pain Edu? --      Excl. in GC? --     Constitutional: Alert and oriented. Eyes: Conjunctivae are normal. Head: Atraumatic. Nose: No congestion/rhinnorhea. Mouth/Throat: Mucous membranes are moist. Neck: Normal ROM Cardiovascular: Normal rate, regular rhythm. Grossly normal heart sounds. Respiratory: Normal respiratory effort.  No retractions. Lungs CTAB. Gastrointestinal: Soft and nontender. No distention. Genitourinary: deferred Musculoskeletal: No lower extremity  tenderness nor edema. Neurologic:  Normal speech and language. No gross focal neurologic deficits are appreciated. Skin:  Skin is warm, dry and intact. No rash noted. Psychiatric: Mood and affect are normal. Speech and behavior are normal.  ____________________________________________   LABS (all labs ordered are listed, but only abnormal results are displayed)  Labs Reviewed  RESPIRATORY PANEL BY RT PCR (FLU A&B, COVID)    PROCEDURES  Procedure(s) performed (including Critical Care):  Procedures  ED ECG REPORT I, 09/29/20, the attending physician, personally viewed and interpreted this ECG.   Date: 09/27/2020  EKG Time: 6:17  Rate: 82  Rhythm: normal sinus rhythm  Axis: Normal  Intervals:none  ST&T Change: None  ____________________________________________   INITIAL IMPRESSION / ASSESSMENT AND PLAN / ED COURSE       29 year old female with past medical history of schizoaffective disorder, asthma, and polysubstance abuse who presents to the ED complaining of cough, congestion, sore throat, shortness of breath, and chest pain.  Her chest pain is atypical as it is sharp  and worse with a deep breath.  I suspect bronchitis or other viral infectious etiology for her symptoms.  We will screen EKG but low suspicion for ACS.  Also plan to screen chest x-ray for pneumonia or other acute process.  Testing for COVID-19 and flu is negative.  Chest x-ray is negative for acute process and EKG shows no evidence of arrhythmia or ischemia.  She is appropriate for discharge home and is requesting prescriptions for pain as well as congestion.  She was counseled to return to the ED for new or worsening symptoms, patient agrees with plan.      ____________________________________________   FINAL CLINICAL IMPRESSION(S) / ED DIAGNOSES  Final diagnoses:  Bronchitis     ED Discharge Orders         Ordered    naproxen (NAPROSYN) 500 MG tablet  2 times daily with meals         09/27/20 0615    pseudoephedrine (SUDAFED) 30 MG tablet  Every 6 hours PRN        09/27/20 0615           Note:  This document was prepared using Dragon voice recognition software and may include unintentional dictation errors.   Chesley Noon, MD 09/27/20 8115    Chesley Noon, MD 09/27/20 [phone]

## 2020-09-27 NOTE — ED Triage Notes (Signed)
Pt presents to ED with fever, cough, congestion, and "a headache that feels like a migraine". Symptoms have been ongoing "for at least 57 hours". Pt alert and talkative. Pt states she has never had COVID but that could be what she has.

## 2020-10-19 ENCOUNTER — Other Ambulatory Visit: Payer: Self-pay

## 2020-10-19 ENCOUNTER — Emergency Department
Admission: EM | Admit: 2020-10-19 | Discharge: 2020-10-19 | Disposition: A | Discharge status: Home / Self Care | Payer: Medicaid Other | Attending: Student in an Organized Health Care Education/Training Program | Admitting: Student in an Organized Health Care Education/Training Program

## 2020-10-19 DIAGNOSIS — Z79899 Other long term (current) drug therapy: Secondary | ICD-10-CM | POA: Diagnosis not present

## 2020-10-19 DIAGNOSIS — J45909 Unspecified asthma, uncomplicated: Secondary | ICD-10-CM | POA: Insufficient documentation

## 2020-10-19 DIAGNOSIS — R102 Pelvic and perineal pain: Secondary | ICD-10-CM | POA: Diagnosis present

## 2020-10-19 DIAGNOSIS — N898 Other specified noninflammatory disorders of vagina: Secondary | ICD-10-CM | POA: Insufficient documentation

## 2020-10-19 DIAGNOSIS — Z87891 Personal history of nicotine dependence: Secondary | ICD-10-CM | POA: Diagnosis not present

## 2020-10-19 LAB — URINALYSIS, COMPLETE (UACMP) WITH MICROSCOPIC
Bacteria, UA: NONE SEEN
Bilirubin Urine: NEGATIVE
Glucose, UA: NEGATIVE mg/dL
Hgb urine dipstick: NEGATIVE
Ketones, ur: NEGATIVE mg/dL
Leukocytes,Ua: NEGATIVE
Nitrite: NEGATIVE
Protein, ur: NEGATIVE mg/dL
Specific Gravity, Urine: 1.024 (ref 1.005–1.030)
pH: 6 (ref 5.0–8.0)

## 2020-10-19 LAB — CHLAMYDIA/NGC RT PCR (ARMC ONLY)
Chlamydia Tr: NOT DETECTED
N gonorrhoeae: NOT DETECTED

## 2020-10-19 LAB — WET PREP, GENITAL
Sperm: NONE SEEN
Trich, Wet Prep: NONE SEEN
Yeast Wet Prep HPF POC: NONE SEEN

## 2020-10-19 LAB — COMPREHENSIVE METABOLIC PANEL
ALT: 9 U/L (ref 0–44)
AST: 21 U/L (ref 15–41)
Albumin: 3.6 g/dL (ref 3.5–5.0)
Alkaline Phosphatase: 53 U/L (ref 38–126)
Anion gap: 8 (ref 5–15)
BUN: 15 mg/dL (ref 6–20)
CO2: 25 mmol/L (ref 22–32)
Calcium: 9.3 mg/dL (ref 8.9–10.3)
Chloride: 105 mmol/L (ref 98–111)
Creatinine, Ser: 0.7 mg/dL (ref 0.44–1.00)
GFR, Estimated: >60 mL/min (ref >60)
Glucose, Bld: 89 mg/dL (ref 70–99)
Potassium: 3.7 mmol/L (ref 3.5–5.1)
Sodium: 138 mmol/L (ref 135–145)
Total Bilirubin: 1 mg/dL (ref 0.3–1.2)
Total Protein: 7 g/dL (ref 6.5–8.1)

## 2020-10-19 LAB — LIPASE, BLOOD: Lipase: 46 U/L (ref 11–51)

## 2020-10-19 LAB — HCG, QUANTITATIVE, PREGNANCY: hCG, Beta Chain, Quant, S: <1 m[IU]/mL (ref <5)

## 2020-10-19 LAB — CBC
HCT: 37.5 % (ref 36.0–46.0)
Hemoglobin: 12.7 g/dL (ref 12.0–15.0)
MCH: 31.6 pg (ref 26.0–34.0)
MCHC: 33.9 g/dL (ref 30.0–36.0)
MCV: 93.3 fL (ref 80.0–100.0)
Platelets: 235 10*3/uL (ref 150–400)
RBC: 4.02 MIL/uL (ref 3.87–5.11)
RDW: 13 % (ref 11.5–15.5)
WBC: 4.3 10*3/uL (ref 4.0–10.5)
nRBC: 0 % (ref 0.0–0.2)

## 2020-10-19 LAB — POCT PREGNANCY, URINE: Preg Test, Ur: NEGATIVE

## 2020-10-19 MED ORDER — DOXYCYCLINE HYCLATE 100 MG PO TABS: 100.0000 mg | ORAL_TABLET | Freq: Two times a day (BID) | ORAL | 0 refills | Status: AC

## 2020-10-19 MED ORDER — METRONIDAZOLE 500 MG PO TABS: 500.0000 mg | ORAL_TABLET | Freq: Two times a day (BID) | ORAL | 0 refills | Status: AC

## 2020-10-19 MED ORDER — CEFTRIAXONE SODIUM 250 MG IJ SOLR
250.0000 mg | Freq: Once | INTRAMUSCULAR | Status: AC
  Administered 2020-10-19: 250 mg via INTRAMUSCULAR
  Filled 2020-10-19: qty 250

## 2020-10-19 MED ORDER — LIDOCAINE HCL (PF) 1 % IJ SOLN
0.9000 mL | Freq: Once | INTRAMUSCULAR | Status: AC
  Administered 2020-10-19: 0.9 mL
  Filled 2020-10-19: qty 5

## 2020-10-19 MED ORDER — AZITHROMYCIN 500 MG PO TABS
1000.0000 mg | ORAL_TABLET | Freq: Once | ORAL | Status: AC
  Administered 2020-10-19: 1000 mg via ORAL
  Filled 2020-10-19: qty 2

## 2020-10-19 NOTE — ED Notes (Signed)
Discharge instructions reviewed with patient and mother was on the phone. ED tech Alissa was in the room when discharge instructions where being reviewed . Pt calm , collective, denied pain or sob. Ambulatory upon discharge.

## 2020-10-19 NOTE — Discharge Instructions (Signed)

## 2020-10-19 NOTE — ED Triage Notes (Signed)
BIB [facility] from home c/o lower abdominal pain and pelvic pain ongoing X 1 week. Pt c/o pain with urination, reports abnormal vaginal discharge. VSS.

## 2020-10-19 NOTE — ED Provider Notes (Signed)
[facility] Emergency Department Provider Note    First MD Initiated Contact with Patient 10/19/20 1846     (approximate)  I have reviewed the triage vital signs and the nursing notes.   HISTORY  Chief Complaint Abdominal Pain    HPI Bridget Carpenter is a 29 y.o. female with the below listed past medical history presents to the ER for evaluation of pelvic pain as well as vaginal discharge.  States she is sexually active.  Does not use condoms.  Denies any lateralizing pain.  No fevers.  No back pain.  She denies any SI or HI.    Past Medical History:  Diagnosis Date  . Asthma   . Depression 2009   Inpatient psych admission for SI, dissociative fugue  . Dissociative disorder or reaction 2009  . Eczema   . H/O: suicide attempt   . ODD (oppositional defiant disorder)   . PTSD (post-traumatic stress disorder)   . Schizoaffective disorder (HCC)   . Substance abuse (HCC)    Family History  Adopted: Yes  Problem Relation Age of Onset  . Mental illness Mother   . Mental illness Father   . Heart disease Maternal Grandmother   . Sickle cell trait Adoptive Mother   . Cleft palate Cousin        maternal side   Past Surgical History:  Procedure Laterality Date  . ADENOIDECTOMY    . TONSILLECTOMY     Patient Active Problem List   Diagnosis Date Noted  . Adjustment disorder   . MDD (major depressive disorder), recurrent episode, severe (HCC) 08/31/2019  . Indication for care in labor and delivery, antepartum 08/29/2019  . Hydrops fetalis 08/29/2019  . Indication for care in labor or delivery 08/14/2019  . Dehydration 08/14/2019  . Rubella non-immune status, antepartum 08/01/2019  . Pain of round ligament affecting pregnancy, antepartum 08/01/2019  . Labor and delivery indication for care or intervention 07/30/2019  . Homelessness 07/25/2019  . Decreased fetal movement 07/02/2019  . Club foot, fetal, affecting care of mother, antepartum  06/29/2019  . Supervision of high risk pregnancy, antepartum 06/19/2019  . Cocaine abuse with cocaine-induced mood disorder (HCC) 04/15/2019  . 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/03/2018  . Bacterial vaginosis 12/25/2017  . PTSD (post-traumatic stress disorder) 09/02/2017  . Schizoaffective disorder (HCC) 09/01/2017  . MRSA carrier 07/18/2017  . Overdose 07/16/2017  . Aspiration pneumonia (HCC) 07/16/2017  . Acute respiratory failure (HCC) 07/16/2017  . Polysubstance abuse (HCC) 07/16/2017  . Eczema 05/08/2017  . Borderline personality disorder (HCC) 11/06/2016  . Cocaine use disorder, severe, dependence (HCC) 11/06/2016  . Cannabis use disorder, moderate, dependence (HCC) 11/06/2016  . Alcohol use disorder, mild, abuse 11/06/2016  . Asthma 09/23/2011  . Tobacco use disorder 09/15/2009      Prior to Admission medications   Medication Sig Start Date End Date Taking? Authorizing Provider  amoxicillin-clavulanate (AUGMENTIN) 875-125 MG tablet Take 1 tablet by mouth 2 (two) times daily. 09/01/20   Cuthriell, Delorise Royals, PA-C  pseudoephedrine (SUDAFED) 30 MG tablet Take 1 tablet (30 mg total) by mouth every 6 (six) hours as needed for congestion. 09/27/20 09/27/21  Chesley Noon, MD  risperiDONE (RISPERDAL) 2 MG tablet Take 2 mg by mouth at bedtime. 06/04/20   [provider]    Allergies Banana, Cantaloupe (diagnostic), Grapefruit extract, Albumin human, and Collard greens [lactuca virosa]    Social History Social History   Tobacco Use  . Smoking status: Former Smoker  Packs/day: 0.75    Years: 2.00    Pack years: 1.50    Types: Cigarettes  . Smokeless tobacco: Never Used  . Tobacco comment: pt states she is quitting today  Vaping Use  . Vaping Use: Never used  Substance Use Topics  . Alcohol use: Yes    Alcohol/week: 1.0 standard drink    Types: 1 Cans of beer per week  . Drug use: Yes    Types: Marijuana, "Crack" cocaine, Heroin    Review of  Systems Patient denies headaches, rhinorrhea, blurry vision, numbness, shortness of breath, chest pain, edema, cough, abdominal pain, nausea, vomiting, diarrhea, dysuria, fevers, rashes or hallucinations unless otherwise stated above in HPI. ____________________________________________   PHYSICAL EXAM:  VITAL SIGNS: Vitals:   10/19/20 1432 10/19/20 1650  BP: (!) 100/58 (!) 165/57  Pulse: 84 61  Resp: 18 18  Temp: 99.5 F (37.5 C)   SpO2: 97% 100%    Constitutional: Alert and oriented.  Eyes: Conjunctivae are normal.  Head: Atraumatic. Nose: No congestion/rhinnorhea. Mouth/Throat: Mucous membranes are moist.   Neck: No stridor. Painless ROM.  Cardiovascular: Normal rate, regular rhythm. Grossly normal heart sounds.  Good peripheral circulation. Respiratory: Normal respiratory effort.  No retractions. Lungs CTAB. Gastrointestinal: Soft and nontender. No distention. No abdominal bruits. No CVA tenderness. Genitourinary: Exam chaperoned by nurse Irving Burton.  No external lesions.  There is purulent discharge coming from the cervix with some cervical motion tenderness.  No adnexal fullness or pain. Musculoskeletal: No lower extremity tenderness nor edema.  No joint effusions. Neurologic:  Normal speech and language. No gross focal neurologic deficits are appreciated. No facial droop Skin:  Skin is warm, dry and intact. No rash noted. Psychiatric: Mood and affect are normal. Speech and behavior are normal.  ____________________________________________   LABS (all labs ordered are listed, but only abnormal results are displayed)  Results for orders placed or performed during the hospital encounter of 10/19/20 (from the past 24 hour(s))  Lipase, blood     Status: None   Collection Time: 10/19/20  2:34 PM  Result Value Ref Range   Lipase 46 11 - 51 U/L  Comprehensive metabolic panel     Status: None   Collection Time: 10/19/20  2:34 PM  Result Value Ref Range   Sodium 138 135 - 145  mmol/L   Potassium 3.7 3.5 - 5.1 mmol/L   Chloride 105 98 - 111 mmol/L   CO2 25 22 - 32 mmol/L   Glucose, Bld 89 70 - 99 mg/dL   BUN 15 6 - 20 mg/dL   Creatinine, Ser 7.74 0.44 - 1.00 mg/dL   Calcium 9.3 8.9 - 12.8 mg/dL   Total Protein 7.0 6.5 - 8.1 g/dL   Albumin 3.6 3.5 - 5.0 g/dL   AST 21 15 - 41 U/L   ALT 9 0 - 44 U/L   Alkaline Phosphatase 53 38 - 126 U/L   Total Bilirubin 1.0 0.3 - 1.2 mg/dL   GFR, Estimated >78 >67 mL/min   Anion gap 8 5 - 15  CBC     Status: None   Collection Time: 10/19/20  2:34 PM  Result Value Ref Range   WBC 4.3 4.0 - 10.5 K/uL   RBC 4.02 3.87 - 5.11 MIL/uL   Hemoglobin 12.7 12.0 - 15.0 g/dL   HCT 67.2 36 - 46 %   MCV 93.3 80.0 - 100.0 fL   MCH 31.6 26.0 - 34.0 pg   MCHC 33.9 30.0 - 36.0 g/dL  RDW 13.0 11.5 - 15.5 %   Platelets 235 150 - 400 K/uL   nRBC 0.0 0.0 - 0.2 %  hCG, quantitative, pregnancy     Status: None   Collection Time: 10/19/20  2:34 PM  Result Value Ref Range   hCG, Beta Chain, Quant, S <1 <5 mIU/mL   ____________________________________________ ____________________________________________  RADIOLOGY   ____________________________________________   PROCEDURES  Procedure(s) performed:  Procedures    Critical Care performed: no ____________________________________________   INITIAL IMPRESSION / ASSESSMENT AND PLAN / ED COURSE  Pertinent labs & imaging results that were available during my care of the patient were reviewed by me and considered in my medical decision making (see chart for details).   DDX: PID, STI, UTI, cervicitis, vaginosis, candidiasis  Bridget [place] Jaelen Gellerman is a 29 y.o. who presents to the ED with symptoms as described above.  Patient nontoxic-appearing.  Exam is concerning for STI and possibly early PID.  Does not seem consistent with torsion, abscess or appendicitis.  While we will treat empirically for PID as I do feel that she is at higher risk.  Do not feel that further diagnostic testing  clinically indicated at this time.  Have discussed with the patient and available family all diagnostics and treatments performed thus far and all questions were answered to the best of my ability. The patient demonstrates understanding and agreement with plan.     The patient was evaluated in Emergency Department today for the symptoms described in the history of present illness. He/she was evaluated in the context of the global COVID-19 pandemic, which necessitated consideration that the patient might be at risk for infection with the SARS-CoV-2 virus that causes COVID-19. Institutional protocols and algorithms that pertain to the evaluation of patients at risk for COVID-19 are in a state of rapid change based on information released by regulatory bodies including the CDC and federal and state organizations. These policies and algorithms were followed during the patient's care in the ED.  As part of my medical decision making, I reviewed the following data within the electronic MEDICAL RECORD NUMBER Nursing notes reviewed and incorporated, Labs reviewed, notes from prior ED visits and [place] Controlled Substance Database   ____________________________________________   FINAL CLINICAL IMPRESSION(S) / ED DIAGNOSES  Final diagnoses:  Pelvic pain in female  Vaginal discharge      NEW MEDICATIONS STARTED DURING THIS VISIT:  New Prescriptions   No medications on file     Note:  This document was prepared using Dragon voice recognition software and may include unintentional dictation errors.    Willy Eddy, MD 10/19/20 1940

## 2020-10-19 NOTE — ED Notes (Signed)
This RN chaperoned ER provider complete pelvic exam on pt. Female ED tech present for exam as well.

## 2020-11-11 ENCOUNTER — Emergency Department
Admission: EM | Admit: 2020-11-11 | Discharge: 2020-11-12 | Disposition: A | Discharge status: Home / Self Care | Payer: Medicaid Other | Attending: Student in an Organized Health Care Education/Training Program | Admitting: Student in an Organized Health Care Education/Training Program

## 2020-11-11 ENCOUNTER — Emergency Department: Payer: Medicaid Other

## 2020-11-11 ENCOUNTER — Other Ambulatory Visit: Payer: Self-pay

## 2020-11-11 ENCOUNTER — Other Ambulatory Visit: Payer: Self-pay | Admitting: Psychiatry

## 2020-11-11 DIAGNOSIS — F250 Schizoaffective disorder, bipolar type: Secondary | ICD-10-CM | POA: Diagnosis not present

## 2020-11-11 DIAGNOSIS — Z046 Encounter for general psychiatric examination, requested by authority: Secondary | ICD-10-CM | POA: Insufficient documentation

## 2020-11-11 DIAGNOSIS — J45909 Unspecified asthma, uncomplicated: Secondary | ICD-10-CM | POA: Diagnosis not present

## 2020-11-11 DIAGNOSIS — R451 Restlessness and agitation: Secondary | ICD-10-CM | POA: Insufficient documentation

## 2020-11-11 DIAGNOSIS — F1220 Cannabis dependence, uncomplicated: Secondary | ICD-10-CM | POA: Diagnosis present

## 2020-11-11 DIAGNOSIS — D72829 Elevated white blood cell count, unspecified: Secondary | ICD-10-CM

## 2020-11-11 DIAGNOSIS — F1590 Other stimulant use, unspecified, uncomplicated: Secondary | ICD-10-CM | POA: Insufficient documentation

## 2020-11-11 DIAGNOSIS — Z87891 Personal history of nicotine dependence: Secondary | ICD-10-CM | POA: Diagnosis not present

## 2020-11-11 DIAGNOSIS — F29 Unspecified psychosis not due to a substance or known physiological condition: Secondary | ICD-10-CM | POA: Diagnosis present

## 2020-11-11 DIAGNOSIS — F1420 Cocaine dependence, uncomplicated: Secondary | ICD-10-CM | POA: Diagnosis present

## 2020-11-11 DIAGNOSIS — F1920 Other psychoactive substance dependence, uncomplicated: Secondary | ICD-10-CM | POA: Diagnosis not present

## 2020-11-11 DIAGNOSIS — F259 Schizoaffective disorder, unspecified: Secondary | ICD-10-CM | POA: Diagnosis present

## 2020-11-11 DIAGNOSIS — F1010 Alcohol abuse, uncomplicated: Secondary | ICD-10-CM | POA: Diagnosis present

## 2020-11-11 LAB — ETHANOL: Alcohol, Ethyl (B): 18 mg/dL — ABNORMAL HIGH (ref <10)

## 2020-11-11 LAB — URINE DRUG SCREEN, QUALITATIVE (ARMC ONLY)
Amphetamines, Ur Screen: NOT DETECTED
Barbiturates, Ur Screen: NOT DETECTED
Benzodiazepine, Ur Scrn: POSITIVE — AB
Cannabinoid 50 Ng, Ur ~~LOC~~: NOT DETECTED
Cocaine Metabolite,Ur ~~LOC~~: POSITIVE — AB
MDMA (Ecstasy)Ur Screen: NOT DETECTED
Methadone Scn, Ur: NOT DETECTED
Opiate, Ur Screen: NOT DETECTED
Phencyclidine (PCP) Ur S: NOT DETECTED
Tricyclic, Ur Screen: NOT DETECTED

## 2020-11-11 LAB — COMPREHENSIVE METABOLIC PANEL
ALT: 23 U/L (ref 0–44)
AST: 85 U/L — ABNORMAL HIGH (ref 15–41)
Albumin: 4.1 g/dL (ref 3.5–5.0)
Alkaline Phosphatase: 53 U/L (ref 38–126)
Anion gap: 17 — ABNORMAL HIGH (ref 5–15)
BUN: 15 mg/dL (ref 6–20)
CO2: 17 mmol/L — ABNORMAL LOW (ref 22–32)
Calcium: 8 mg/dL — ABNORMAL LOW (ref 8.9–10.3)
Chloride: 106 mmol/L (ref 98–111)
Creatinine, Ser: 0.96 mg/dL (ref 0.44–1.00)
GFR, Estimated: >60 mL/min (ref >60)
Glucose, Bld: 42 mg/dL — CL (ref 70–99)
Potassium: 4.2 mmol/L (ref 3.5–5.1)
Sodium: 140 mmol/L (ref 135–145)
Total Bilirubin: 1.4 mg/dL — ABNORMAL HIGH (ref 0.3–1.2)
Total Protein: 7.7 g/dL (ref 6.5–8.1)

## 2020-11-11 LAB — CBC
HCT: 37.4 % (ref 36.0–46.0)
Hemoglobin: 12.1 g/dL (ref 12.0–15.0)
MCH: 31.3 pg (ref 26.0–34.0)
MCHC: 32.4 g/dL (ref 30.0–36.0)
MCV: 96.6 fL (ref 80.0–100.0)
Platelets: 237 10*3/uL (ref 150–400)
RBC: 3.87 MIL/uL (ref 3.87–5.11)
RDW: 12.8 % (ref 11.5–15.5)
WBC: 16.5 10*3/uL — ABNORMAL HIGH (ref 4.0–10.5)
nRBC: 0 % (ref 0.0–0.2)

## 2020-11-11 LAB — CBG MONITORING, ED
Glucose-Capillary: 60 mg/dL — ABNORMAL LOW (ref 70–99)
Glucose-Capillary: 68 mg/dL — ABNORMAL LOW (ref 70–99)
Glucose-Capillary: 75 mg/dL (ref 70–99)

## 2020-11-11 LAB — POC URINE PREG, ED: Preg Test, Ur: NEGATIVE

## 2020-11-11 MED ORDER — MIDAZOLAM HCL 2 MG/2ML IJ SOLN
INTRAMUSCULAR | Status: AC
  Filled 2020-11-11: qty 2

## 2020-11-11 MED ORDER — MIDAZOLAM HCL 2 MG/2ML IJ SOLN
4.0000 mg | Freq: Once | INTRAMUSCULAR | Status: AC
  Administered 2020-11-11: 4 mg via INTRAMUSCULAR

## 2020-11-11 MED ORDER — MIDAZOLAM HCL 2 MG/2ML IJ SOLN
2.0000 mg | Freq: Once | INTRAMUSCULAR | Status: DC
  Filled 2020-11-11: qty 2

## 2020-11-11 MED ORDER — RISPERIDONE 0.5 MG PO TBDP: 2.0000 mg | ORAL_TABLET | Freq: Every day | ORAL | Status: DC

## 2020-11-11 MED ORDER — MIDAZOLAM HCL 2 MG/2ML IJ SOLN
4.0000 mg | Freq: Once | INTRAMUSCULAR | Status: AC
  Administered 2020-11-11: 4 mg via INTRAMUSCULAR
  Filled 2020-11-11: qty 4

## 2020-11-11 NOTE — ED Notes (Addendum)
Pt placed in 4 point restraints with verbal order from Dr. Roxan Hockey. 3 [employer] officers, this RN, Tax inspector, Rushie Goltz, NT, [place], [place], at bedside to assist. Dr Roxan Hockey at bedside to assess.

## 2020-11-11 NOTE — ED Notes (Signed)
CBG 42 per Lab. Pt given orange juice but pt refuses

## 2020-11-11 NOTE — ED Provider Notes (Addendum)
[facility] Emergency Department Provider Note    First MD Initiated Contact with Patient 11/11/20 1447     (approximate)  I have reviewed the triage vital signs and the nursing notes.   HISTORY  Chief Complaint Psychiatric Evaluation  Level V Caveat:  Acute psychosis substance abuse  HPI Bridget Carpenter is a 29 y.o. female stents a past medical history presents under IVC via police department after smoking crack with agitated delirium violent behavior towards family member requiring 3 officers to restrain her. She appears acutely psychotic hypervigilant. Would not provide any additional history    Past Medical History:  Diagnosis Date  . Asthma   . Depression 2009   Inpatient psych admission for SI, dissociative fugue  . Dissociative disorder or reaction 2009  . Eczema   . H/O: suicide attempt   . ODD (oppositional defiant disorder)   . PTSD (post-traumatic stress disorder)   . Schizoaffective disorder (HCC)   . Substance abuse (HCC)    Family History  Adopted: Yes  Problem Relation Age of Onset  . Mental illness Mother   . Mental illness Father   . Heart disease Maternal Grandmother   . Sickle cell trait Adoptive Mother   . Cleft palate Cousin        maternal side   Past Surgical History:  Procedure Laterality Date  . ADENOIDECTOMY    . TONSILLECTOMY     Patient Active Problem List   Diagnosis Date Noted  . Adjustment disorder   . MDD (major depressive disorder), recurrent episode, severe (HCC) 08/31/2019  . Indication for care in labor and delivery, antepartum 08/29/2019  . Hydrops fetalis 08/29/2019  . Indication for care in labor or delivery 08/14/2019  . Dehydration 08/14/2019  . Rubella non-immune status, antepartum 08/01/2019  . Pain of round ligament affecting pregnancy, antepartum 08/01/2019  . Labor and delivery indication for care or intervention 07/30/2019  . Homelessness 07/25/2019  . Decreased fetal movement  07/02/2019  . Club foot, fetal, affecting care of mother, antepartum 06/29/2019  . Supervision of high risk pregnancy, antepartum 06/19/2019  . Cocaine abuse with cocaine-induced mood disorder (HCC) 04/15/2019  . 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/03/2018  . Bacterial vaginosis 12/25/2017  . PTSD (post-traumatic stress disorder) 09/02/2017  . Schizoaffective disorder (HCC) 09/01/2017  . MRSA carrier 07/18/2017  . Overdose 07/16/2017  . Aspiration pneumonia (HCC) 07/16/2017  . Acute respiratory failure (HCC) 07/16/2017  . Polysubstance abuse (HCC) 07/16/2017  . Eczema 05/08/2017  . Borderline personality disorder (HCC) 11/06/2016  . Cocaine use disorder, severe, dependence (HCC) 11/06/2016  . Cannabis use disorder, moderate, dependence (HCC) 11/06/2016  . Alcohol use disorder, mild, abuse 11/06/2016  . Asthma 09/23/2011  . Tobacco use disorder 09/15/2009      Prior to Admission medications   Medication Sig Start Date End Date Taking? Authorizing Provider  pseudoephedrine (SUDAFED) 30 MG tablet Take 1 tablet (30 mg total) by mouth every 6 (six) hours as needed for congestion. 09/27/20 09/27/21  Chesley Noon, MD  risperiDONE (RISPERDAL) 2 MG tablet Take 2 mg by mouth at bedtime. 06/04/20   [provider]    Allergies Banana, Cantaloupe (diagnostic), Grapefruit extract, Albumin human, and Collard greens [lactuca virosa]    Social History Social History   Tobacco Use  . Smoking status: Former Smoker    Packs/day: 0.75    Years: 2.00    Pack years: 1.50    Types: Cigarettes  . Smokeless tobacco: Never Used  .  Tobacco comment: pt states she is quitting today  Vaping Use  . Vaping Use: Never used  Substance Use Topics  . Alcohol use: Yes    Alcohol/week: 1.0 standard drink    Types: 1 Cans of beer per week  . Drug use: Yes    Types: Marijuana, "Crack" cocaine, Heroin    Review of Systems Patient denies headaches, rhinorrhea, blurry vision,  numbness, shortness of breath, chest pain, edema, cough, abdominal pain, nausea, vomiting, diarrhea, dysuria, fevers, rashes or hallucinations unless otherwise stated above in HPI. ____________________________________________   PHYSICAL EXAM:  VITAL SIGNS: Vitals:   11/11/20 1616 11/11/20 1619  BP: (!) 133/96   Pulse: (!) 124   Resp: (!) 24   Temp:  99.2 F (37.3 C)  SpO2: 99%     Constitutional: acute agitation Eyes: Conjunctivae are normal. Pupils dilated  Head: Atraumatic. Nose: No congestion/rhinnorhea. Mouth/Throat: Mucous membranes are moist.   Neck: No stridor. Painless ROM.  Cardiovascular: tachycardic regular rhythm. Grossly normal heart sounds.  Good peripheral circulation. Respiratory: Normal respiratory effort.  No retractions. Lungs CTAB. Gastrointestinal: Soft and nontender. No distention. No abdominal bruits. No CVA tenderness. Genitourinary: deferred Musculoskeletal: No lower extremity tenderness nor edema.  No joint effusions. Neurologic:  Tremulous, no rigidity, MAE spontaneously Skin:  Skin is warm, dry and intact. No rash noted. Psychiatric: hypervigilant, acutely agitated, disorganized ____________________________________________   LABS (all labs ordered are listed, but only abnormal results are displayed)  No results found for this or any previous visit (from the past 24 hour(s)). ____________________________________________  EKG My review and personal interpretation at Time: 16:12   Indication: agitation  Rate: 125  Rhythm: sinus Axis: normal Other: tachycardic, prolonged qtc, nonspecific st abn ____________________________________________  RADIOLOGY  I personally reviewed all radiographic images ordered to evaluate for the above acute complaints and reviewed radiology reports and findings.  These findings were personally discussed with the patient.  Please see medical record for radiology  report.  ____________________________________________   PROCEDURES  Procedure(s) performed:  .Critical Care Performed by: Willy Eddy, MD Authorized by: Willy Eddy, MD   Critical care provider statement:    Critical care time (minutes):  35   Critical care time was exclusive of:  Separately billable procedures and treating other patients   Critical care was necessary to treat or prevent imminent or life-threatening deterioration of the following conditions:  [place]   Critical care was time spent personally by me on the following activities:  Development of treatment plan with patient or surrogate, discussions with consultants, evaluation of patient's response to treatment, examination of patient, obtaining history from patient or surrogate, ordering and performing treatments and interventions, ordering and review of laboratory studies, ordering and review of radiographic studies, pulse oximetry, re-evaluation of patient's condition and review of old charts      Critical Care performed: yes ____________________________________________   INITIAL IMPRESSION / ASSESSMENT AND PLAN / ED COURSE  Pertinent labs & imaging results that were available during my care of the patient were reviewed by me and considered in my medical decision making (see chart for details).   DDX: Psychosis, delirium, medication effect, noncompliance, polysubstance abuse, Si, Hi, depression   Bridget Carpenter is a 29 y.o. who presents to the ED with acute agitated delirium in the setting of witness crack use today.  Patient combative and will require IM medication in the form of IM Versed.  No report of any trauma.  I suspect that most of her presentation is secondary to substance  abuse will continue IVC will consult psych given her history of schizophrenia.  Will order blood work for the but differential.  Will place on cardiac monitor given her tachycardia.   Behavioral Restraint Provider  Note:  Behavioral Indicators: Danger to self, Danger to others and Violent behavior     Reaction to intervention: resisting     Review of systems: Unable to assess 2/2 acute psychosis/agitation     History: As above   Mental Status Exam: Alert with acute agitation and violent behavior and combative in the setting of polysubstance abuse  Restraint Continuation: Terminate       Restraint Rationale Continuation:  Clinical Course as of Nov 11 1622  Sun Nov 11, 2020  1558 Bridget [place] is much more calm right now after receiving heavy doses of Versed.  She is protecting her airway will be placed on cardiac monitor.  Blood will be sent for the above differential.   [PR]    Clinical Course User Index [PR] Willy Eddy, MD    The patient was evaluated in Emergency Department today for the symptoms described in the history of present illness. He/she was evaluated in the context of the global COVID-19 pandemic, which necessitated consideration that the patient might be at risk for infection with the SARS-CoV-2 virus that causes COVID-19. Institutional protocols and algorithms that pertain to the evaluation of patients at risk for COVID-19 are in a state of rapid change based on information released by regulatory bodies including the CDC and federal and state organizations. These policies and algorithms were followed during the patient's care in the ED.  As part of my medical decision making, I reviewed the following data within the electronic MEDICAL RECORD NUMBER Nursing notes reviewed and incorporated, Labs reviewed, notes from prior ED visits and [place] Controlled Substance Database   ____________________________________________   FINAL CLINICAL IMPRESSION(S) / ED DIAGNOSES  Final diagnoses:  Agitation  Polysubstance (excluding opioids) dependence (HCC)      NEW MEDICATIONS STARTED DURING THIS VISIT:  New Prescriptions   No medications on file     Note:  This  document was prepared using Dragon voice recognition software and may include unintentional dictation errors.    Willy Eddy, MD 11/11/20 1623    Willy Eddy, MD 11/13/20 [phone]

## 2020-11-11 NOTE — ED Notes (Signed)
TTS at bedside. Caitlin, NT at bedside to attempt to remove piercings.

## 2020-11-11 NOTE — ED Notes (Signed)
Pt given phone to talk to her mom

## 2020-11-11 NOTE — ED Notes (Signed)
Pt told [place], NT that she "overdosed to kill myself"

## 2020-11-11 NOTE — ED Notes (Addendum)
Pt taken off wrist restraints with verbal order from Dr Roxan Hockey. Leg restraints remain on.

## 2020-11-11 NOTE — ED Notes (Signed)
Pt belongings include two shoes, one pair jeans, one shirt, one jacket, x2 $20 biills, loose change in pocket. Pt has ring on her hand that will not come off with attempts by this RN and officers. Pt also has a belly button ring and lip ring that pt is refusing to let this RN remove.

## 2020-11-11 NOTE — ED Notes (Signed)
Pt held down by multiple officers and staff members for medication. Faith, NT monitoring pt airway.

## 2020-11-11 NOTE — ED Notes (Signed)
Pt taken out of leg restraints at this time.

## 2020-11-11 NOTE — Consult Note (Signed)
[facility] Face-to-Face Psychiatry Consult   Reason for Consult:   Consult for 29 year old woman known to the emergency room and psychiatric service brought in under IVC reporting psychotic bizarre agitated behavior Referring Physician: Roxan Hockey Patient Identification: [place] Shayan Uber MRN:  [id number] Principal Diagnosis: Schizoaffective disorder (HCC) Diagnosis:  Principal Problem:   Schizoaffective disorder (HCC) Active Problems:   Cocaine use disorder, severe, dependence (HCC)   Cannabis use disorder, moderate, dependence (HCC)   Alcohol use disorder, mild, abuse   Total Time spent with patient: 1 hour  Subjective:   Bridget Carpenter is a 29 y.o. female patient admitted with patient not able to give any information.  HPI: Commitment papers reviewed.  Law enforcement was called to the patient's family home where they found the patient agitated making delusional psychotic statements acting bizarrely uncooperative.  Patient was brought to the emergency room where she continues to be agitated.  Unresponsive to verbal redirection.  Aggressive towards self and others.  Required four-point restraints and several IM injections of sedating medication.  Patient unable to offer any information or history at this time.  Past Psychiatric History: Past history of recurrent psychotic presentation usually in the context of drug abuse most often cocaine.  Also history of alcohol and cannabis abuse.  Also likely history of underlying schizoaffective disorder  Risk to Self:   Risk to Others:   Prior Inpatient Therapy:   Prior Outpatient Therapy:    Past Medical History:  Past Medical History:  Diagnosis Date  . Asthma   . Depression 2009   Inpatient psych admission for SI, dissociative fugue  . Dissociative disorder or reaction 2009  . Eczema   . H/O: suicide attempt   . ODD (oppositional defiant disorder)   . PTSD (post-traumatic stress disorder)   . Schizoaffective disorder (HCC)   .  Substance abuse [facility])     Past Surgical History:  Procedure Laterality Date  . ADENOIDECTOMY    . TONSILLECTOMY     Family History:  Family History  Adopted: Yes  Problem Relation Age of Onset  . Mental illness Mother   . Mental illness Father   . Heart disease Maternal Grandmother   . Sickle cell trait Adoptive Mother   . Cleft palate Cousin        maternal side   Family Psychiatric  History: See previous Social History:  Social History   Substance and Sexual Activity  Alcohol Use Yes  . Alcohol/week: 1.0 standard drink  . Types: 1 Cans of beer per week     Social History   Substance and Sexual Activity  Drug Use Yes  . Types: Marijuana, "Crack" cocaine, Heroin    Social History   Socioeconomic History  . Marital status: Single    Spouse name: Not on file  . Number of children: 1  . Years of education: Not on file  . Highest education level: Not on file  Occupational History  . Not on file  Tobacco Use  . Smoking status: Former Smoker    Packs/day: 0.75    Years: 2.00    Pack years: 1.50    Types: Cigarettes  . Smokeless tobacco: Never Used  . Tobacco comment: pt states she is quitting today  Vaping Use  . Vaping Use: Never used  Substance and Sexual Activity  . Alcohol use: Yes    Alcohol/week: 1.0 standard drink    Types: 1 Cans of beer per week  . Drug use: Yes    Types: Marijuana, "  Crack" cocaine, Heroin  . Sexual activity: Not Currently    Partners: Male  Other Topics Concern  . Not on file  Social History Narrative   Is homeless   Her mother is her daughter's gaurdian   Multiple psychiatric hospitalizations, not on any medication currently   Substance use disorder   Social Determinants of Health   Financial Resource Strain:   . Difficulty of Paying Living Expenses: Not on file  Food Insecurity:   . Worried About Programme researcher, broadcasting/film/video in the Last Year: Not on file  . Ran Out of Food in the Last Year: Not on file  Transportation Needs:    . Lack of Transportation (Medical): Not on file  . Lack of Transportation (Non-Medical): Not on file  Physical Activity:   . Days of Exercise per Week: Not on file  . Minutes of Exercise per Session: Not on file  Stress:   . Feeling of Stress : Not on file  Social Connections:   . Frequency of Communication with Friends and Family: Not on file  . Frequency of Social Gatherings with Friends and Family: Not on file  . Attends Religious Services: Not on file  . Active Member of Clubs or Organizations: Not on file  . Attends Banker Meetings: Not on file  . Marital Status: Not on file   Additional Social History:    Allergies:   Allergies  Allergen Reactions  . Banana Hives and Swelling  . Cantaloupe (Diagnostic) Hives  . Grapefruit Extract Itching and Swelling  . Albumin Human Other (See Comments)    Pt refuses all blood products.   Rozanna Box Virosa]     Labs: No results found for this or any previous visit (from the past 48 hour(s)).  Current Facility-Administered Medications  Medication Dose Route Frequency Provider Last Rate Last Admin  . midazolam (VERSED) injection 2 mg  2 mg Intramuscular Once Willy Eddy, MD      . risperiDONE (RISPERDAL M-TABS) disintegrating tablet 2 mg  2 mg Oral QHS Deborha Moseley, [name], MD       Current Outpatient Medications  Medication Sig Dispense Refill  . pseudoephedrine (SUDAFED) 30 MG tablet Take 1 tablet (30 mg total) by mouth every 6 (six) hours as needed for congestion. 24 tablet 0  . risperiDONE (RISPERDAL) 2 MG tablet Take 2 mg by mouth at bedtime.      Musculoskeletal: Strength & Muscle Tone: within normal limits Gait & Station: unsteady Patient leans: N/A  Psychiatric Specialty Exam: Physical Exam Vitals and nursing note reviewed.  Constitutional:      Appearance: Normal appearance. She is well-developed. She is not ill-appearing.  HENT:     Head: Normocephalic and atraumatic.  Eyes:      Conjunctiva/sclera: Conjunctivae normal.     Pupils: Pupils are equal, round, and reactive to light.  Cardiovascular:     Heart sounds: Normal heart sounds.  Pulmonary:     Effort: Pulmonary effort is normal.  Abdominal:     Palpations: Abdomen is soft.  Musculoskeletal:        General: Normal range of motion.     Cervical back: Normal range of motion.  Skin:    General: Skin is warm and dry.  Psychiatric:        Attention and Perception: She is inattentive.        Mood and Affect: Affect is labile.        Speech: She is noncommunicative.  Behavior: Behavior is agitated.        Cognition and Memory: Cognition is impaired.     Review of Systems  Unable to perform ROS: Patient nonverbal    Blood pressure (!) 146/45, pulse (!) 133, resp. rate (!) 22, height 5\' 7"  (1.702 m), weight 58 kg, SpO2 100 %, unknown if currently breastfeeding.Body mass index is 20.03 kg/m.  General Appearance: Disheveled  Eye Contact:  None  Speech:  Slow  Volume:  Decreased  Mood:  Negative  Affect:  Inappropriate  Thought Process:  NA  Orientation:  Negative  Thought Content:  Negative  Suicidal Thoughts:  Unknown  Homicidal Thoughts:  Unknown  Memory:  Negative  Judgement:  Negative  Insight:  Negative  Psychomotor Activity:  Negative  Concentration:  Concentration: Negative  Recall:  Negative  Fund of Knowledge:  Negative  Language:  Negative  Akathisia:  Negative  Handed:  Right  AIMS (if indicated):     Assets:  Resilience  ADL's:  Impaired  Cognition:  Impaired,  Mild  Sleep:        Treatment Plan Summary: Plan Patient currently extremely agitated fighting with law enforcement unable to give any information or history.  Looks underweight and poorly groomed.  Based on previous history very likely abusing drugs but drug screen and other labs pending.  So far she is in restraints and has been ordered medicine by emergency room staff for calming her down.  I have put in order to  restart Risperdal 2 mg at night which had been prescribed previously.  Patient will need to be reassessed once she is lucid and able to give information.  Disposition: Recommend psychiatric Inpatient admission when medically cleared. Supportive therapy provided about ongoing stressors.  , MD 11/11/2020 3:55 PM

## 2020-11-11 NOTE — ED Notes (Signed)
Pt given sprite instead

## 2020-11-11 NOTE — ED Notes (Signed)
Unable to properly assess patient at this time due to sleeping after receiving medications.

## 2020-11-11 NOTE — ED Notes (Signed)
Pt assisted to use the bedpan.

## 2020-11-11 NOTE — ED Notes (Signed)
IVC, pend placement 

## 2020-11-11 NOTE — ED Triage Notes (Signed)
Pt comes IVC by [employer] after using crack and being aggressive towards her mother. Psych history, drug history, etc. Pt saying "Bridget Carpenter" and other religious slang over and over being aggressive toward staff and [employer]. Unable to obtain vitals. Pt in forensic restraints with [employer] at bedside.

## 2020-11-11 NOTE — BH Assessment (Addendum)
Assessment Note  Bridget Carpenter is an 29 y.o. female who presents to [facility] ED involuntarily for treatment. Per triage note, Pt comes IVC by [employer] after using crack and being aggressive towards her mother. Psych history, drug history, etc. Pt saying "Luan Moore" and other religious slang over and over being aggressive toward staff and [employer]. Unable to obtain vitals. Pt in forensic restraints with [employer] at bedside.  During TTS assessment pt presents incoherent, disorganized, some thought blocking, irritable, restless, impaired, uncooperative, and mood-congruent with affect.  The pt does not appear to be responding to internal or external stimuli but is presenting with some delusional thinking. At the start of the assessment pt was observed to be agitated with RN's by refusing to remove all personal items (Lip piercing & ring) and growing suspicious of RN's trying to "poison her" in their attempt to provide her orange juice for low sugar levels. Pt vaguely answered some assessment questions and abruptly states, "I tried to OD to kill myself". In TTS attempt to assess pt's current SI pt states, "I don't want to talk anymore I was given to much medicine and people trying to hurt me and stuff". Pt refuses to participate in the assessment at this time. Per pt chart, pt has a hx of aggressive behaviors, polysubstance abuse (cocaine, marijuana, alcohol) and schizoaffective disorder.  Per Dr. Toni Amend pt meets criteria for INPT when medically cleared  Diagnosis: Per hx schizoaffective disorder   Past Medical History:  Past Medical History:  Diagnosis Date  . Asthma   . Depression 2009   Inpatient psych admission for SI, dissociative fugue  . Dissociative disorder or reaction 2009  . Eczema   . H/O: suicide attempt   . ODD (oppositional defiant disorder)   . PTSD (post-traumatic stress disorder)   . Schizoaffective disorder (HCC)   . Substance abuse [facility])     Past Surgical History:  Procedure Laterality  Date  . ADENOIDECTOMY    . TONSILLECTOMY      Family History:  Family History  Adopted: Yes  Problem Relation Age of Onset  . Mental illness Mother   . Mental illness Father   . Heart disease Maternal Grandmother   . Sickle cell trait Adoptive Mother   . Cleft palate Cousin        maternal side    Social History:  reports that she has quit smoking. Her smoking use included cigarettes. She has a 1.50 pack-year smoking history. She has never used smokeless tobacco. She reports current alcohol use of about 1.0 standard drink of alcohol per week. She reports current drug use. Drugs: Marijuana, "Crack" cocaine, and Heroin.  Additional Social History:  Alcohol / Drug Use Pain Medications: see mar Prescriptions: see mar Over the Counter: see mar History of alcohol / drug use?: Yes Substance #1 Name of Substance 1: cocaine  CIWA: CIWA-Ar BP: (!) 133/96 Pulse Rate: (!) 124 COWS:    Allergies:  Allergies  Allergen Reactions  . Banana Hives and Swelling  . Cantaloupe (Diagnostic) Hives  . Grapefruit Extract Itching and Swelling  . Albumin Human Other (See Comments)    Pt refuses all blood products.   Rozanna Box Virosa]     Home Medications: (Not in a hospital admission)   OB/GYN Status:  No LMP recorded (lmp unknown).  General Assessment Data Location of Assessment: [facility] ED TTS Assessment: In system Is this a Tele or Face-to-Face Assessment?: Face-to-Face Is this an Initial Assessment or a Re-assessment for this  encounter?: Initial Assessment Patient Accompanied by:: N/A Language Other than English: No Living Arrangements: Other (Comment) What gender do you identify as?: Female Date Telepsych consult ordered in [facility]: 11/11/20 Time Telepsych consult ordered in [facility]: 1444 Marital status: Single Maiden name: n/a Pregnancy Status: No Living Arrangements: Other (Comment) Can pt return to current living arrangement?:  (unknown ) Admission Status:  Involuntary Is patient capable of signing voluntary admission?: No Referral Source: Self/Family/Friend Insurance type: Medicaid      Crisis Care Plan Living Arrangements: Other (Comment) Legal Guardian:  (self) Name of Psychiatrist: None reported Name of Therapist: None reported   Education Status Is patient currently in school?: No Is the patient employed, unemployed or receiving disability?: Unemployed  Risk to self with the past 6 months Suicidal Ideation: Yes-Currently Present Has patient been a risk to self within the past 6 months prior to admission? : No Suicidal Intent: Yes-Currently Present Has patient had any suicidal intent within the past 6 months prior to admission? : No Is patient at risk for suicide?: No Suicidal Plan?: Yes-Currently Present Has patient had any suicidal plan within the past 6 months prior to admission? : No Specify Current Suicidal Plan: OD (Pt states "I tried to OD to kill myself") Access to Means: Yes Specify Access to Suicidal Means: Pt has access to drugs  What has been your use of drugs/alcohol within the last 12 months?: Cocaine & benzodiazepines  Previous Attempts/Gestures: No How many times?: 1 Other Self Harm Risks: None reported  Triggers for Past Attempts: Unknown Intentional Self Injurious Behavior: None Family Suicide History: Unknown Recent stressful life event(s): Conflict (Comment) (unclear ) Persecutory voices/beliefs?:  (UTA) Depression: Yes Depression Symptoms: Feeling angry/irritable Substance abuse history and/or treatment for substance abuse?: Yes Suicide prevention information given to non-admitted patients: Not applicable  Risk to Others within the past 6 months Homicidal Ideation:  (UTA) Does patient have any lifetime risk of violence toward others beyond the six months prior to admission? : Unknown Thoughts of Harm to Others:  (UTA) Current Homicidal Intent:  (UTA) Current Homicidal Plan:  (UTA) Access to  Homicidal Means:  (UTA) Identified Victim: UTA History of harm to others?: No Assessment of Violence: On admission Violent Behavior Description: Verbal and physical aggression  Does patient have access to weapons?:  (UTA) Criminal Charges Pending?:  (UTA) Does patient have a court date:  (UTA) Is patient on probation?: Unknown  Psychosis Hallucinations: Auditory, Visual Delusions: Persecutory  Mental Status Report Appearance/Hygiene: In scrubs Eye Contact: Poor Motor Activity: Mannerisms, Restlessness, Unsteady Speech: Incoherent, Slurred, Tangential Level of Consciousness: Restless, Combative, Irritable Mood: Depressed, Suspicious, Irritable, Apprehensive Affect: Depressed, Irritable, Apprehensive Anxiety Level: Minimal Thought Processes: Tangential, Thought Blocking Judgement: Impaired Orientation: Not oriented Obsessive Compulsive Thoughts/Behaviors: None  Cognitive Functioning Concentration: Poor Memory: Recent Impaired, Remote Impaired Is patient IDD: No Insight: Poor Impulse Control: Poor Appetite: Poor Have you had any weight changes? :  (UTA) Sleep: Unable to Assess Total Hours of Sleep:  (UTA) Vegetative Symptoms: None  ADLScreening [facility] Assessment Services) Patient's cognitive ability adequate to safely complete daily activities?: Yes Patient able to express need for assistance with ADLs?: Yes Independently performs ADLs?: Yes (appropriate for developmental age)  Prior Inpatient Therapy Prior Inpatient Therapy: Yes Prior Therapy Dates: 2021 Prior Therapy Facilty/Provider(s): [facility] Reason for Treatment: MH/SA  Prior Outpatient Therapy Prior Outpatient Therapy: Yes Prior Therapy Dates: 2021 Prior Therapy Facilty/Provider(s): [facility] Reason for Treatment: MH/SA Does patient have an ACCT team?: No Does patient have Intensive In-House Services?  :  No Does patient have [facility] services? : No Does patient have P4CC services?: No  ADL Screening (condition at  time of admission) Patient's cognitive ability adequate to safely complete daily activities?: Yes Is the patient deaf or have difficulty hearing?: No Does the patient have difficulty seeing, even when wearing glasses/contacts?: No Does the patient have difficulty concentrating, remembering, or making decisions?: No Patient able to express need for assistance with ADLs?: Yes Does the patient have difficulty dressing or bathing?: No Independently performs ADLs?: Yes (appropriate for developmental age) Does the patient have difficulty walking or climbing stairs?: No Weakness of Legs: None Weakness of Arms/Hands: None  Home Assistive Devices/Equipment Home Assistive Devices/Equipment: None  Therapy Consults (therapy consults require a physician order) PT Evaluation Needed: No OT Evalulation Needed: No SLP Evaluation Needed: No Abuse/Neglect Assessment (Assessment to be complete while patient is alone) Abuse/Neglect Assessment Can Be Completed: Yes Physical Abuse: Denies Verbal Abuse: Denies Sexual Abuse: Denies Exploitation of patient/patient's resources: Denies Self-Neglect: Denies Values / Beliefs Cultural Requests During Hospitalization: None Spiritual Requests During Hospitalization: None Consults Spiritual Care Consult Needed: No Transition of Care Team Consult Needed: No Advance Directives (For Healthcare) Does Patient Have a Medical Advance Directive?: No          Disposition:  Disposition Initial Assessment Completed for this Encounter: Yes Patient referred to: Other (Comment)  On Site Evaluation by:   Reviewed with Physician:    Opal Sidles 11/11/2020 5:45 PM

## 2020-11-11 NOTE — ED Notes (Signed)
Attempting to get patient to eat. Pt paranoid about the food. Pt unable to eat bananas (allergy). Pt given sprite and takes a few sips.

## 2020-11-12 LAB — CBG MONITORING, ED: Glucose-Capillary: 83 mg/dL (ref 70–99)

## 2020-11-12 MED ORDER — ACETAMINOPHEN 500 MG PO TABS
1000.0000 mg | ORAL_TABLET | Freq: Once | ORAL | Status: AC
  Administered 2020-11-12: 1000 mg via ORAL
  Filled 2020-11-12: qty 2

## 2020-11-12 MED ORDER — ESCITALOPRAM OXALATE 10 MG PO TABS: 20.0000 mg | ORAL_TABLET | Freq: Every day | ORAL | Status: DC

## 2020-11-12 MED ORDER — LORAZEPAM 1 MG PO TABS
1.0000 mg | ORAL_TABLET | Freq: Once | ORAL | Status: DC
  Filled 2020-11-12: qty 1

## 2020-11-12 MED ORDER — ESCITALOPRAM OXALATE 20 MG PO TABS: 20.0000 mg | ORAL_TABLET | Freq: Every day | ORAL | 1 refills | Status: DC

## 2020-11-12 NOTE — ED Notes (Signed)
Pt offered a shower and declined at this time.

## 2020-11-12 NOTE — ED Provider Notes (Signed)
Emergency Medicine Observation Re-evaluation Note  [name], seen on rounds today.  Pt initially presented to the ED for complaints of Psychiatric Evaluation Currently, the patient is sleeping.  Physical Exam  BP 133/60 (BP Location: Left Arm)   Pulse 89   Temp 98.4 F (36.9 C) (Oral)   Resp 16   Ht 5\' 7"  (1.702 m)   Wt 58 kg   LMP  (LMP Unknown)   SpO2 99%   BMI 20.03 kg/m  Physical Exam Constitutional:      Appearance: She is not ill-appearing or toxic-appearing.  Eyes:     Extraocular Movements: Extraocular movements intact.     Pupils: Pupils are equal, round, and reactive to light.  Cardiovascular:     Rate and Rhythm: Normal rate.  Pulmonary:     Effort: Pulmonary effort is normal.  Abdominal:     General: There is no distension.  Skin:    General: Skin is warm and dry.  Neurological:     General: No focal deficit present.     Cranial Nerves: No cranial nerve deficit.      ED Course / MDM  EKG:  Clinical Course as of Nov 15 0001  Sun Nov 11, 2020  1558 Nocturia is much more calm right now after receiving heavy doses of Versed.  She is protecting her airway will be placed on cardiac monitor.  Blood will be sent for the above differential.   [PR]    Clinical Course User Index [PR] Nov 13, 2020, MD   I have reviewed the labs performed to date as well as medications administered while in observation.  Recent changes in the last 24 hours include hypoglycemia, treated with PO juice and food. Last recheck 20 mins ago normal. Psych eval recommending inpatient psych care.  Plan  Current plan is for inpatient psych disposition. Patient is under full IVC at this time.   Willy Eddy, MD 11/12/20 [phone]

## 2020-11-12 NOTE — ED Notes (Signed)
Patient voiced understanding of discharge instructions, all belongings given back to Patient, no signs of distress, Patient called for a ride home, her prescriptions were given to her.

## 2020-11-12 NOTE — ED Notes (Signed)
Pt requested to take a shower. Shower supplies given. Pt is currently taken a shower at this moment.
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
## 2020-11-12 NOTE — ED Notes (Signed)
Patient request crackers and juice, given.
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
## 2020-11-12 NOTE — Discharge Instructions (Addendum)
Return to the ER for any new or worsening symptoms that concern you, and especially any thoughts of wanting to hurt yourself or others.  Take your normal medications as prescribed.  Follow-up with your regular psychiatrist.

## 2020-11-12 NOTE — ED Notes (Signed)
Patient given breakfast tray.

## 2020-11-12 NOTE — ED Provider Notes (Signed)
-----------------------------------------   12:19 PM on 11/12/2020 -----------------------------------------  The patient has been cleared for discharge by psychiatry.  I discussed the case with Dr. Toni Amend.  On reassessment, the patient is calm and cooperative.  She states that she feels well and is comfortable going home.  There is no evidence of danger to self or others at this time.  Return precautions given, and she expresses understanding.   Dionne Bucy, MD 11/12/20 1220

## 2020-11-17 ENCOUNTER — Emergency Department
Admission: EM | Admit: 2020-11-17 | Discharge: 2020-11-17 | Disposition: A | Discharge status: Home / Self Care | Payer: Medicaid Other | Attending: Emergency Medicine | Admitting: Emergency Medicine

## 2020-11-17 ENCOUNTER — Other Ambulatory Visit: Payer: Self-pay

## 2020-11-17 ENCOUNTER — Emergency Department: Payer: Medicaid Other

## 2020-11-17 DIAGNOSIS — R059 Cough, unspecified: Secondary | ICD-10-CM | POA: Insufficient documentation

## 2020-11-17 DIAGNOSIS — Z79899 Other long term (current) drug therapy: Secondary | ICD-10-CM | POA: Insufficient documentation

## 2020-11-17 DIAGNOSIS — Z87891 Personal history of nicotine dependence: Secondary | ICD-10-CM | POA: Diagnosis not present

## 2020-11-17 DIAGNOSIS — N898 Other specified noninflammatory disorders of vagina: Secondary | ICD-10-CM | POA: Diagnosis not present

## 2020-11-17 DIAGNOSIS — Z20822 Contact with and (suspected) exposure to covid-19: Secondary | ICD-10-CM | POA: Insufficient documentation

## 2020-11-17 DIAGNOSIS — J45909 Unspecified asthma, uncomplicated: Secondary | ICD-10-CM | POA: Diagnosis not present

## 2020-11-17 DIAGNOSIS — R067 Sneezing: Secondary | ICD-10-CM | POA: Insufficient documentation

## 2020-11-17 DIAGNOSIS — J069 Acute upper respiratory infection, unspecified: Secondary | ICD-10-CM

## 2020-11-17 LAB — URINALYSIS, COMPLETE (UACMP) WITH MICROSCOPIC
Bacteria, UA: NONE SEEN
Bilirubin Urine: NEGATIVE
Glucose, UA: NEGATIVE mg/dL
Hgb urine dipstick: NEGATIVE
Ketones, ur: NEGATIVE mg/dL
Leukocytes,Ua: NEGATIVE
Nitrite: NEGATIVE
Protein, ur: 30 mg/dL — AB
Specific Gravity, Urine: 1.027 (ref 1.005–1.030)
pH: 5 (ref 5.0–8.0)

## 2020-11-17 LAB — WET PREP, GENITAL
Clue Cells Wet Prep HPF POC: NONE SEEN
Sperm: NONE SEEN
Trich, Wet Prep: NONE SEEN
Yeast Wet Prep HPF POC: NONE SEEN

## 2020-11-17 LAB — RESP PANEL BY RT-PCR (FLU A&B, COVID) ARPGX2
Influenza A by PCR: NEGATIVE
Influenza B by PCR: NEGATIVE
SARS Coronavirus 2 by RT PCR: NEGATIVE

## 2020-11-17 LAB — POC URINE PREG, ED: Preg Test, Ur: NEGATIVE

## 2020-11-17 MED ORDER — CEFTRIAXONE SODIUM 250 MG IJ SOLR
250.0000 mg | Freq: Once | INTRAMUSCULAR | Status: AC
  Administered 2020-11-17: 250 mg via INTRAMUSCULAR
  Filled 2020-11-17: qty 250

## 2020-11-17 MED ORDER — LIDOCAINE HCL (PF) 1 % IJ SOLN
0.9000 mL | Freq: Once | INTRAMUSCULAR | Status: AC
  Administered 2020-11-17: 0.9 mL
  Filled 2020-11-17: qty 5

## 2020-11-17 MED ORDER — AZITHROMYCIN 500 MG PO TABS
1000.0000 mg | ORAL_TABLET | Freq: Once | ORAL | Status: AC
  Administered 2020-11-17: 1000 mg via ORAL
  Filled 2020-11-17: qty 2

## 2020-11-17 NOTE — ED Notes (Signed)
EDP and this RN bedside for pelvic exam. Pt tolerated well.

## 2020-11-17 NOTE — ED Notes (Signed)
Pt's VSS, NAD distress noted. Pt instructed to use cup when going to the bathroom. Pt concerned about not being pregnant and states "I am trying to be".

## 2020-11-17 NOTE — ED Triage Notes (Signed)
Pt c/o of vaginal discharge. Pt states she has "pneumonia" and wants a chest x-ray, but states she also might be pregnant, but does not want an x-ray if she is pregnant.

## 2020-11-17 NOTE — ED Provider Notes (Signed)
[facility] Emergency Department Provider Note  Time seen: 9:43 PM  I have reviewed the triage vital signs and the nursing notes.   HISTORY  Chief Complaint Vaginal Discharge  HPI Bridget Carpenter is a 29 y.o. female with a past medical history of depression, PVCs, schizophrenia, substance abuse, presents to the emergency department multiple complaints.  Patient states she has had a wet sounding cough and sneezing.  Patient states she has received Covid vaccinations.  Also states she has been experiencing vaginal discharge and wants to know if she is pregnant.  Does not know when her last period was.  Patient states she is having white discharge.   Past Medical History:  Diagnosis Date  . Asthma   . Depression 2009   Inpatient psych admission for SI, dissociative fugue  . Dissociative disorder or reaction 2009  . Eczema   . H/O: suicide attempt   . ODD (oppositional defiant disorder)   . PTSD (post-traumatic stress disorder)   . Schizoaffective disorder (HCC)   . Substance abuse [facility])     Patient Active Problem List   Diagnosis Date Noted  . Adjustment disorder   . MDD (major depressive disorder), recurrent episode, severe (HCC) 08/31/2019  . Indication for care in labor and delivery, antepartum 08/29/2019  . Hydrops fetalis 08/29/2019  . Indication for care in labor or delivery 08/14/2019  . Dehydration 08/14/2019  . Rubella non-immune status, antepartum 08/01/2019  . Pain of round ligament affecting pregnancy, antepartum 08/01/2019  . Labor and delivery indication for care or intervention 07/30/2019  . Homelessness 07/25/2019  . Decreased fetal movement 07/02/2019  . Club foot, fetal, affecting care of mother, antepartum 06/29/2019  . Supervision of high risk pregnancy, antepartum 06/19/2019  . Cocaine abuse with cocaine-induced mood disorder (HCC) 04/15/2019  . 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/03/2018  . Bacterial vaginosis  12/25/2017  . PTSD (post-traumatic stress disorder) 09/02/2017  . Schizoaffective disorder (HCC) 09/01/2017  . MRSA carrier 07/18/2017  . Overdose 07/16/2017  . Aspiration pneumonia (HCC) 07/16/2017  . Acute respiratory failure (HCC) 07/16/2017  . Polysubstance abuse (HCC) 07/16/2017  . Eczema 05/08/2017  . Borderline personality disorder (HCC) 11/06/2016  . Cocaine use disorder, severe, dependence (HCC) 11/06/2016  . Cannabis use disorder, moderate, dependence (HCC) 11/06/2016  . Alcohol use disorder, mild, abuse 11/06/2016  . Asthma 09/23/2011  . Tobacco use disorder 09/15/2009    Past Surgical History:  Procedure Laterality Date  . ADENOIDECTOMY    . TONSILLECTOMY      Prior to Admission medications   Medication Sig Start Date End Date Taking? Authorizing Provider  escitalopram (LEXAPRO) 20 MG tablet Take 1 tablet (20 mg total) by mouth daily. 11/12/20   Clapacs, [name], MD  pseudoephedrine (SUDAFED) 30 MG tablet Take 1 tablet (30 mg total) by mouth every 6 (six) hours as needed for congestion. 09/27/20 09/27/21  Chesley Noon, MD  risperiDONE (RISPERDAL) 2 MG tablet Take 2 mg by mouth at bedtime. 06/04/20   [provider]    Allergies  Allergen Reactions  . Banana Hives and Swelling  . Cantaloupe (Diagnostic) Hives  . Grapefruit Extract Itching and Swelling  . Albumin Human Other (See Comments)    Pt refuses all blood products.   Rozanna Box Virosa]     Family History  Adopted: Yes  Problem Relation Age of Onset  . Mental illness Mother   . Mental illness Father   . Heart disease Maternal Grandmother   .  Sickle cell trait Adoptive Mother   . Cleft palate Cousin        maternal side    Social History Social History   Tobacco Use  . Smoking status: Former Smoker    Packs/day: 0.75    Years: 2.00    Pack years: 1.50    Types: Cigarettes  . Smokeless tobacco: Never Used  . Tobacco comment: pt states she is quitting today  Vaping Use   . Vaping Use: Never used  Substance Use Topics  . Alcohol use: Yes    Alcohol/week: 1.0 standard drink    Types: 1 Cans of beer per week  . Drug use: Yes    Types: Marijuana, "Crack" cocaine, Heroin    Review of Systems Constitutional: Negative for fever. Cardiovascular: Negative for chest pain. Respiratory: Negative for shortness of breath.  Positive for cough Gastrointestinal: Negative for abdominal pain, vomiting  Genitourinary: Vaginal discharge Musculoskeletal: Negative for musculoskeletal complaints Neurological: Negative for headache All other ROS negative  ____________________________________________   PHYSICAL EXAM:  VITAL SIGNS: ED Triage Vitals [11/17/20 2038]  Enc Vitals Group     BP (!) 144/116     Pulse Rate 74     Resp 18     Temp 98.5 F (36.9 C)     Temp Source Oral     SpO2 100 %     Weight      Height      Head Circumference      Peak Flow      Pain Score 0     Pain Loc      Pain Edu?      Excl. in GC?    Constitutional: Awake alert, no acute distress. Eyes: Normal exam ENT      Head: Normocephalic and atraumatic.      Mouth/Throat: Mucous membranes are moist. Cardiovascular: Normal rate, regular rhythm.  Respiratory: Normal respiratory effort without tachypnea nor retractions. Breath sounds are clear Gastrointestinal: Soft and nontender. No distention.  Musculoskeletal: Nontender with normal range of motion in all extremities.  Neurologic:  Normal speech and language. No gross focal neurologic deficits Skin:  Skin is warm, dry and intact.  Psychiatric: Mood and affect are normal.   ____________________________________________   INITIAL IMPRESSION / ASSESSMENT AND PLAN / ED COURSE  Pertinent labs & imaging results that were available during my care of the patient were reviewed by me and considered in my medical decision making (see chart for details).   Patient presents to the emergency department complaints of cough sneezing and  vaginal discharge.  Patient states she has noticed a white vaginal discharge but also states she is put deodorant on her privates and could be related to this.  She also states she has been coughing and sneezing over the past few days.  Has received Covid vaccinations.  No known fever per patient.  But she is worried that she could have pneumonia.  We will obtain a chest x-ray as a precaution as well as a Covid swab.  We will perform a pelvic examination check a pregnancy test, urinalysis and GC/chlamydia.  Pelvic exam shows a mild amount of discharge.  No significant tenderness.  Patient is requesting treatment for gonorrhea and chlamydia as a precaution.  We will treat with a shot and pills.  Wet prep is pending.  Covid test pending.  Chest x-ray negative.  Urinalysis negative.  X-rays negative.  Covid test negative.  Wet prep negative.  We will discharge home.  Patient  agreeable to plan of care.  Bridget Carpenter was evaluated in Emergency Department on 11/17/2020 for the symptoms described in the history of present illness. She was evaluated in the context of the global COVID-19 pandemic, which necessitated consideration that the patient might be at risk for infection with the SARS-CoV-2 virus that causes COVID-19. Institutional protocols and algorithms that pertain to the evaluation of patients at risk for COVID-19 are in a state of rapid change based on information released by regulatory bodies including the CDC and federal and state organizations. These policies and algorithms were followed during the patient's care in the ED.  ____________________________________________   FINAL CLINICAL IMPRESSION(S) / ED DIAGNOSES  Vaginal discharge Cough   Minna Antis, MD 11/17/20 2305

## 2020-11-17 NOTE — ED Notes (Signed)
Patient to waiting room ambulatory with EMS from home.  Per EMS patient being seen for vaginal discharge.  EMS vitals -- hr 74, bp 136/76, pulse oxi 97% on room air.

## 2020-11-18 LAB — CHLAMYDIA/NGC RT PCR (ARMC ONLY)
Chlamydia Tr: NOT DETECTED
N gonorrhoeae: NOT DETECTED

## 2020-12-14 ENCOUNTER — Emergency Department
Admission: EM | Admit: 2020-12-14 | Discharge: 2020-12-14 | Disposition: A | Discharge status: Home / Self Care | Payer: Medicaid Other | Attending: Emergency Medicine | Admitting: Emergency Medicine

## 2020-12-14 ENCOUNTER — Other Ambulatory Visit: Payer: Self-pay

## 2020-12-14 ENCOUNTER — Encounter: Payer: Self-pay | Admitting: Emergency Medicine

## 2020-12-14 DIAGNOSIS — N939 Abnormal uterine and vaginal bleeding, unspecified: Secondary | ICD-10-CM | POA: Insufficient documentation

## 2020-12-14 DIAGNOSIS — Z79899 Other long term (current) drug therapy: Secondary | ICD-10-CM | POA: Diagnosis not present

## 2020-12-14 DIAGNOSIS — J45909 Unspecified asthma, uncomplicated: Secondary | ICD-10-CM | POA: Insufficient documentation

## 2020-12-14 DIAGNOSIS — Z87891 Personal history of nicotine dependence: Secondary | ICD-10-CM | POA: Insufficient documentation

## 2020-12-14 LAB — CBC WITH DIFFERENTIAL/PLATELET
Abs Immature Granulocytes: 0.02 10*3/uL (ref 0.00–0.07)
Basophils Absolute: 0.1 10*3/uL (ref 0.0–0.1)
Basophils Relative: 1 %
Eosinophils Absolute: 0.2 10*3/uL (ref 0.0–0.5)
Eosinophils Relative: 2 %
HCT: 40 % (ref 36.0–46.0)
Hemoglobin: 13.4 g/dL (ref 12.0–15.0)
Immature Granulocytes: 0 %
Lymphocytes Relative: 26 %
Lymphs Abs: 1.8 10*3/uL (ref 0.7–4.0)
MCH: 31.8 pg (ref 26.0–34.0)
MCHC: 33.5 g/dL (ref 30.0–36.0)
MCV: 95 fL (ref 80.0–100.0)
Monocytes Absolute: 0.5 10*3/uL (ref 0.1–1.0)
Monocytes Relative: 7 %
Neutro Abs: 4.4 10*3/uL (ref 1.7–7.7)
Neutrophils Relative %: 64 %
Platelets: 231 10*3/uL (ref 150–400)
RBC: 4.21 MIL/uL (ref 3.87–5.11)
RDW: 12.6 % (ref 11.5–15.5)
WBC: 6.9 10*3/uL (ref 4.0–10.5)
nRBC: 0 % (ref 0.0–0.2)

## 2020-12-14 LAB — URINALYSIS, COMPLETE (UACMP) WITH MICROSCOPIC
Bacteria, UA: NONE SEEN
Bilirubin Urine: NEGATIVE
Glucose, UA: NEGATIVE mg/dL
Ketones, ur: 20 mg/dL — AB
Leukocytes,Ua: NEGATIVE
Nitrite: NEGATIVE
Protein, ur: NEGATIVE mg/dL
Specific Gravity, Urine: 1.013 (ref 1.005–1.030)
pH: 5 (ref 5.0–8.0)

## 2020-12-14 LAB — COMPREHENSIVE METABOLIC PANEL
ALT: 16 U/L (ref 0–44)
AST: 29 U/L (ref 15–41)
Albumin: 3.7 g/dL (ref 3.5–5.0)
Alkaline Phosphatase: 62 U/L (ref 38–126)
Anion gap: 12 (ref 5–15)
BUN: 10 mg/dL (ref 6–20)
CO2: 21 mmol/L — ABNORMAL LOW (ref 22–32)
Calcium: 8.8 mg/dL — ABNORMAL LOW (ref 8.9–10.3)
Chloride: 101 mmol/L (ref 98–111)
Creatinine, Ser: 0.67 mg/dL (ref 0.44–1.00)
GFR, Estimated: >60 mL/min (ref >60)
Glucose, Bld: 66 mg/dL — ABNORMAL LOW (ref 70–99)
Potassium: 3.8 mmol/L (ref 3.5–5.1)
Sodium: 134 mmol/L — ABNORMAL LOW (ref 135–145)
Total Bilirubin: 1.5 mg/dL — ABNORMAL HIGH (ref 0.3–1.2)
Total Protein: 7.4 g/dL (ref 6.5–8.1)

## 2020-12-14 LAB — HCG, QUANTITATIVE, PREGNANCY: hCG, Beta Chain, Quant, S: <1 m[IU]/mL (ref <5)

## 2020-12-14 LAB — POC URINE PREG, ED: Preg Test, Ur: NEGATIVE

## 2020-12-14 NOTE — ED Notes (Signed)
Says 2 days of vaginal bleeding and not time for period.  She is worried about stds and says she has a bad odor.  Vaginal discharge.

## 2020-12-14 NOTE — ED Triage Notes (Signed)
Patient ambulatory to triage with steady gait, without difficulty or distress noted; pt reports she began having vag bleeding yesterday and is concerned that she may be pregnant; denies pain

## 2020-12-14 NOTE — ED Notes (Signed)
Pt called in lobby to recheck vital signs, no response to name called

## 2020-12-14 NOTE — ED Provider Notes (Signed)
[facility] Emergency Department Provider Note   ____________________________________________    I have reviewed the triage vital signs and the nursing notes.   HISTORY  Chief Complaint Vaginal Bleeding     HPI Bridget Carpenter is a 29 y.o. female with history as noted below.  Patient reports that she is having vaginal bleeding this morning, she is concerned that she has had a miscarriage.  No abdominal pain or cramping.  She does not know if she was pregnant or not.  No fevers chills.  Does not know when her last period was.  Past Medical History:  Diagnosis Date  . Asthma   . Depression 2009   Inpatient psych admission for SI, dissociative fugue  . Dissociative disorder or reaction 2009  . Eczema   . H/O: suicide attempt   . ODD (oppositional defiant disorder)   . PTSD (post-traumatic stress disorder)   . Schizoaffective disorder (HCC)   . Substance abuse [facility])     Patient Active Problem List   Diagnosis Date Noted  . Adjustment disorder   . MDD (major depressive disorder), recurrent episode, severe (HCC) 08/31/2019  . Indication for care in labor and delivery, antepartum 08/29/2019  . Hydrops fetalis 08/29/2019  . Indication for care in labor or delivery 08/14/2019  . Dehydration 08/14/2019  . Rubella non-immune status, antepartum 08/01/2019  . Pain of round ligament affecting pregnancy, antepartum 08/01/2019  . Labor and delivery indication for care or intervention 07/30/2019  . Homelessness 07/25/2019  . Decreased fetal movement 07/02/2019  . Club foot, fetal, affecting care of mother, antepartum 06/29/2019  . Supervision of high risk pregnancy, antepartum 06/19/2019  . Cocaine abuse with cocaine-induced mood disorder (HCC) 04/15/2019  . 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/03/2018  . Bacterial vaginosis 12/25/2017  . PTSD (post-traumatic stress disorder) 09/02/2017  . Schizoaffective disorder (HCC) 09/01/2017   . MRSA carrier 07/18/2017  . Overdose 07/16/2017  . Aspiration pneumonia (HCC) 07/16/2017  . Acute respiratory failure (HCC) 07/16/2017  . Polysubstance abuse (HCC) 07/16/2017  . Eczema 05/08/2017  . Borderline personality disorder (HCC) 11/06/2016  . Cocaine use disorder, severe, dependence (HCC) 11/06/2016  . Cannabis use disorder, moderate, dependence (HCC) 11/06/2016  . Alcohol use disorder, mild, abuse 11/06/2016  . Asthma 09/23/2011  . Tobacco use disorder 09/15/2009    Past Surgical History:  Procedure Laterality Date  . ADENOIDECTOMY    . TONSILLECTOMY      Prior to Admission medications   Medication Sig Start Date End Date Taking? Authorizing Provider  escitalopram (LEXAPRO) 20 MG tablet Take 1 tablet (20 mg total) by mouth daily. 11/12/20   Clapacs, [name], MD  pseudoephedrine (SUDAFED) 30 MG tablet Take 1 tablet (30 mg total) by mouth every 6 (six) hours as needed for congestion. 09/27/20 09/27/21  Chesley Noon, MD  risperiDONE (RISPERDAL) 2 MG tablet Take 2 mg by mouth at bedtime. 06/04/20   [provider]     Allergies Banana, Cantaloupe (diagnostic), Grapefruit extract, Albumin human, and Collard greens [lactuca virosa]  Family History  Adopted: Yes  Problem Relation Age of Onset  . Mental illness Mother   . Mental illness Father   . Heart disease Maternal Grandmother   . Sickle cell trait Adoptive Mother   . Cleft palate Cousin        maternal side    Social History Social History   Tobacco Use  . Smoking status: Former Smoker    Packs/day: 0.75    Years:  2.00    Pack years: 1.50    Types: Cigarettes  . Smokeless tobacco: Never Used  . Tobacco comment: pt states she is quitting today  Vaping Use  . Vaping Use: Never used  Substance Use Topics  . Alcohol use: Yes    Alcohol/week: 1.0 standard drink    Types: 1 Cans of beer per week  . Drug use: Yes    Types: Marijuana, "Crack" cocaine, Heroin    Review of  Systems  Constitutional: No fever/chills Eyes: No visual changes.  ENT: No sore throat. Cardiovascular: Denies chest pain. Respiratory: Denies shortness of breath. Gastrointestinal: As above Genitourinary: As above Musculoskeletal: Negative for back pain. Skin: Negative for rash. Neurological: Negative for headaches or weakness   ____________________________________________   PHYSICAL EXAM:  VITAL SIGNS: ED Triage Vitals  Enc Vitals Group     BP 12/14/20 0601 116/73     Pulse Rate 12/14/20 0601 83     Resp 12/14/20 0601 18     Temp 12/14/20 0601 97.9 F (36.6 C)     Temp Source 12/14/20 0856 Oral     SpO2 12/14/20 0601 97 %     Weight 12/14/20 0601 61.2 kg (135 lb)     Height 12/14/20 0601 1.702 m (5\' 7" )     Head Circumference --      Peak Flow --      Pain Score 12/14/20 0600 0     Pain Loc --      Pain Edu? --      Excl. in GC? --     Constitutional: Alert and oriented.   Nose: No congestion/rhinnorhea. Mouth/Throat: Mucous membranes are moist.    Respiratory: Normal respiratory effort.  No retractions Gastrointestinal: Soft and nontender. No distention. Genitourinary: deferred per patient request Musculoskeletal:   Warm and well perfused Neurologic:  Normal speech and language. No gross focal neurologic deficits are appreciated.  Skin:  Skin is warm, dry and intact. No rash noted. Psychiatric: Mood and affect are normal. Speech and behavior are normal.  ____________________________________________   LABS (all labs ordered are listed, but only abnormal results are displayed)  Labs Reviewed  COMPREHENSIVE METABOLIC PANEL - Abnormal; Notable for the following components:      Result Value   Sodium 134 (*)    CO2 21 (*)    Glucose, Bld 66 (*)    Calcium 8.8 (*)    Total Bilirubin 1.5 (*)    All other components within normal limits  URINALYSIS, COMPLETE (UACMP) WITH MICROSCOPIC - Abnormal; Notable for the following components:   Color, Urine YELLOW  (*)    APPearance CLEAR (*)    Hgb urine dipstick LARGE (*)    Ketones, ur 20 (*)    All other components within normal limits  CBC WITH DIFFERENTIAL/PLATELET  HCG, QUANTITATIVE, PREGNANCY  POC URINE PREG, ED   ____________________________________________  EKG  None ____________________________________________  RADIOLOGY  None ____________________________________________   PROCEDURES  Procedure(s) performed: No  Procedures   Critical Care performed: No ____________________________________________   INITIAL IMPRESSION / ASSESSMENT AND PLAN / ED COURSE  Pertinent labs & imaging results that were available during my care of the patient were reviewed by me and considered in my medical decision making (see chart for details).  Patient presents with vaginal bleeding, likely menstrual bleeding as hCG is less than 1.  Informed patient that she is not pregnant and this is not a miscarriage, suspect menstrual bleeding.  Recommend outpatient follow-up with OB/GYN.  ____________________________________________   FINAL CLINICAL IMPRESSION(S) / ED DIAGNOSES  Final diagnoses:  Vaginal bleeding        Note:  This document was prepared using Dragon voice recognition software and may include unintentional dictation errors.   Jene Every, MD 12/14/20 1345

## 2020-12-20 ENCOUNTER — Ambulatory Visit: Payer: Medicaid Other | Admitting: Obstetrics and Gynecology

## 2020-12-20 NOTE — Progress Notes (Deleted)
Center, [facility]   No chief complaint on file.   HPI:      Bridget Carpenter is a 29 y.o. G3P1011 whose LMP was Patient's last menstrual period was 11/30/2020 (exact date)., presents today for *** Neg STD testing in ED 11/21 due to pelvic pain, neg GYN [place] 10/20 Neg UPT at ED 12/14/20  Past Medical History:  Diagnosis Date  . Asthma   . Depression 2009   Inpatient psych admission for SI, dissociative fugue  . Dissociative disorder or reaction 2009  . Eczema   . H/O: suicide attempt   . ODD (oppositional defiant disorder)   . PTSD (post-traumatic stress disorder)   . Schizoaffective disorder (HCC)   . Substance abuse [facility])     Past Surgical History:  Procedure Laterality Date  . ADENOIDECTOMY    . TONSILLECTOMY      Family History  Adopted: Yes  Problem Relation Age of Onset  . Mental illness Mother   . Mental illness Father   . Heart disease Maternal Grandmother   . Sickle cell trait Adoptive Mother   . Cleft palate Cousin        maternal side    Social History   Socioeconomic History  . Marital status: Single    Spouse name: Not on file  . Number of children: 1  . Years of education: Not on file  . Highest education level: Not on file  Occupational History  . Not on file  Tobacco Use  . Smoking status: Former Smoker    Packs/day: 0.75    Years: 2.00    Pack years: 1.50    Types: Cigarettes  . Smokeless tobacco: Never Used  . Tobacco comment: pt states she is quitting today  Vaping Use  . Vaping Use: Never used  Substance and Sexual Activity  . Alcohol use: Yes    Alcohol/week: 1.0 standard drink    Types: 1 Cans of beer per week  . Drug use: Yes    Types: Marijuana, "Crack" cocaine, Heroin  . Sexual activity: Not Currently    Partners: Male  Other Topics Concern  . Not on file  Social History Narrative   Is homeless   Her mother is her daughter's gaurdian   Multiple psychiatric hospitalizations, not on any  medication currently   Substance use disorder   Social Determinants of Health   Financial Resource Strain: Not on file  Food Insecurity: Not on file  Transportation Needs: Not on file  Physical Activity: Not on file  Stress: Not on file  Social Connections: Not on file  Intimate Partner Violence: Not on file    Outpatient Medications Prior to Visit  Medication Sig Dispense Refill  . escitalopram (LEXAPRO) 20 MG tablet Take 1 tablet (20 mg total) by mouth daily. 30 tablet 1  . pseudoephedrine (SUDAFED) 30 MG tablet Take 1 tablet (30 mg total) by mouth every 6 (six) hours as needed for congestion. 24 tablet 0  . risperiDONE (RISPERDAL) 2 MG tablet Take 2 mg by mouth at bedtime.     No facility-administered medications prior to visit.      ROS:  Review of Systems BREAST: No symptoms   OBJECTIVE:   Vitals:  LMP 11/30/2020 (Exact Date)   Physical Exam  Results: No results found for this or any previous visit (from the past 24 hour(s)).   Assessment/Plan: No diagnosis found.    No orders of the defined types were placed in this  encounter.     No follow-ups on file.  Bridget Starry B. Areli Jowett, PA-C 12/20/2020 9:54 AM

## 2020-12-25 ENCOUNTER — Ambulatory Visit: Payer: Medicaid Other | Admitting: Obstetrics

## 2021-01-30 ENCOUNTER — Inpatient Hospital Stay
Admission: RE | Admit: 2021-01-30 | Payer: No Typology Code available for payment source | Source: Intra-hospital | Admitting: Psychiatry

## 2021-01-30 ENCOUNTER — Other Ambulatory Visit: Payer: Self-pay

## 2021-01-30 ENCOUNTER — Emergency Department
Admission: EM | Admit: 2021-01-30 | Discharge: 2021-01-31 | Disposition: A | Discharge status: Home / Self Care | Payer: No Typology Code available for payment source | Attending: Emergency Medicine | Admitting: Emergency Medicine

## 2021-01-30 ENCOUNTER — Encounter: Payer: Self-pay | Admitting: Emergency Medicine

## 2021-01-30 DIAGNOSIS — F1010 Alcohol abuse, uncomplicated: Secondary | ICD-10-CM | POA: Diagnosis not present

## 2021-01-30 DIAGNOSIS — F322 Major depressive disorder, single episode, severe without psychotic features: Secondary | ICD-10-CM | POA: Insufficient documentation

## 2021-01-30 DIAGNOSIS — F4310 Post-traumatic stress disorder, unspecified: Secondary | ICD-10-CM | POA: Insufficient documentation

## 2021-01-30 DIAGNOSIS — F259 Schizoaffective disorder, unspecified: Secondary | ICD-10-CM | POA: Insufficient documentation

## 2021-01-30 DIAGNOSIS — Z046 Encounter for general psychiatric examination, requested by authority: Secondary | ICD-10-CM | POA: Diagnosis present

## 2021-01-30 DIAGNOSIS — Z20822 Contact with and (suspected) exposure to covid-19: Secondary | ICD-10-CM | POA: Diagnosis not present

## 2021-01-30 DIAGNOSIS — F250 Schizoaffective disorder, bipolar type: Secondary | ICD-10-CM

## 2021-01-30 DIAGNOSIS — J45909 Unspecified asthma, uncomplicated: Secondary | ICD-10-CM | POA: Insufficient documentation

## 2021-01-30 DIAGNOSIS — F4320 Adjustment disorder, unspecified: Secondary | ICD-10-CM | POA: Diagnosis not present

## 2021-01-30 DIAGNOSIS — Z87891 Personal history of nicotine dependence: Secondary | ICD-10-CM | POA: Insufficient documentation

## 2021-01-30 DIAGNOSIS — Z79899 Other long term (current) drug therapy: Secondary | ICD-10-CM | POA: Insufficient documentation

## 2021-01-30 DIAGNOSIS — F1414 Cocaine abuse with cocaine-induced mood disorder: Secondary | ICD-10-CM | POA: Diagnosis present

## 2021-01-30 LAB — HEMOGLOBIN A1C
Hgb A1c MFr Bld: 5.1 % (ref 4.8–5.6)
Mean Plasma Glucose: 99.67 mg/dL

## 2021-01-30 LAB — LIPID PANEL
Cholesterol: 128 mg/dL (ref 0–200)
HDL: 99 mg/dL (ref >40)
LDL Cholesterol: 22 mg/dL (ref 0–99)
Total CHOL/HDL Ratio: 1.3 RATIO
Triglycerides: 35 mg/dL (ref <150)
VLDL: 7 mg/dL (ref 0–40)

## 2021-01-30 LAB — COMPREHENSIVE METABOLIC PANEL
ALT: 9 U/L (ref 0–44)
AST: 25 U/L (ref 15–41)
Albumin: 3.9 g/dL (ref 3.5–5.0)
Alkaline Phosphatase: 60 U/L (ref 38–126)
Anion gap: 6 (ref 5–15)
BUN: 9 mg/dL (ref 6–20)
CO2: 28 mmol/L (ref 22–32)
Calcium: 8.9 mg/dL (ref 8.9–10.3)
Chloride: 105 mmol/L (ref 98–111)
Creatinine, Ser: 0.66 mg/dL (ref 0.44–1.00)
GFR, Estimated: >60 mL/min (ref >60)
Glucose, Bld: 107 mg/dL — ABNORMAL HIGH (ref 70–99)
Potassium: 3.6 mmol/L (ref 3.5–5.1)
Sodium: 139 mmol/L (ref 135–145)
Total Bilirubin: 1.4 mg/dL — ABNORMAL HIGH (ref 0.3–1.2)
Total Protein: 7.7 g/dL (ref 6.5–8.1)

## 2021-01-30 LAB — SARS CORONAVIRUS 2 BY RT PCR (HOSPITAL ORDER, PERFORMED IN ~~LOC~~ HOSPITAL LAB): SARS Coronavirus 2: NEGATIVE

## 2021-01-30 LAB — CBC
HCT: 41.8 % (ref 36.0–46.0)
Hemoglobin: 13.6 g/dL (ref 12.0–15.0)
MCH: 31.3 pg (ref 26.0–34.0)
MCHC: 32.5 g/dL (ref 30.0–36.0)
MCV: 96.1 fL (ref 80.0–100.0)
Platelets: 263 10*3/uL (ref 150–400)
RBC: 4.35 MIL/uL (ref 3.87–5.11)
RDW: 12.8 % (ref 11.5–15.5)
WBC: 7.8 10*3/uL (ref 4.0–10.5)
nRBC: 0 % (ref 0.0–0.2)

## 2021-01-30 LAB — ETHANOL: Alcohol, Ethyl (B): <10 mg/dL (ref <10)

## 2021-01-30 LAB — SALICYLATE LEVEL: Salicylate Lvl: <7 mg/dL — ABNORMAL LOW (ref 7.0–30.0)

## 2021-01-30 LAB — ACETAMINOPHEN LEVEL: Acetaminophen (Tylenol), Serum: <10 ug/mL — ABNORMAL LOW (ref 10–30)

## 2021-01-30 MED ORDER — ARIPIPRAZOLE 10 MG PO TABS
10.0000 mg | ORAL_TABLET | Freq: Two times a day (BID) | ORAL | Status: DC
  Filled 2021-01-30 (×3): qty 1

## 2021-01-30 NOTE — ED Notes (Signed)
IN  ERROR  PT  VOL  CONSULT  DONE   PT  MOVED  TO  [facility]  UNIT

## 2021-01-30 NOTE — Consult Note (Signed)
[facility] Face-to-Face Psychiatry Consult   Reason for Consult: Consult for this 30 year old woman with a history of chronic mental health and substance abuse problems who presents voluntarily Referring Physician: Su Hoff Patient Identification: Bridget Carpenter Bridget Carpenter MRN:  [id number] Principal Diagnosis: Schizoaffective disorder (HCC) Diagnosis:  Principal Problem:   Schizoaffective disorder (HCC) Active Problems:   Alcohol use disorder, mild, abuse   Cocaine abuse with cocaine-induced mood disorder (HCC)   Total Time spent with patient: 1 hour  Subjective:   Bridget Carpenter Bridget Carpenter is a 30 y.o. female patient admitted with I am suicidal and homicidal".  HPI: Patient seen chart reviewed.  Patient known from previous encounters.  30 year old woman with chronic mental health and substance abuse problems presented voluntarily to the emergency room.  She tells me her chief complaint is that she is "suicidal and homicidal".  After she tells me that she does not go on to detail anything else about it which contributes to the evidence superficiality of that comment.  She does however display a lot of psychotic symptoms.  Patient is rambling and disorganized in her speech.  Paranoid expressing delusions talking about how she is a shape shifter who is really a 30 year old boy from 50 years ago.  She does admit that she has been using crack cocaine and alcohol.  Exact details a little hard parts.  Also not entirely clear where she has been staying although my guess is that it is been with her family since that is usually where she ends up.  Patient is poorly groomed looks like she has not been caring for self.  Very dry skin with lots of minor abrasions.  Appears to have lost weight.  She did not actually specify anyone that she was going to hurt and she was not aggressive.  In fact she was quite pleasant during the interview.  She tells me that her mother was going to take her to [facility] today.  She could  not really explain to me why that plan did not go through.  I tried to reach her mother for collateral but do not have a phone number yet.  Past Psychiatric History: Long history of mental health and substance abuse problems.  Multiple prior admissions multiple visits to the emergency room.  Last inpatient admission here was in 2019 but she has been to the emergency room many times since then.  A lot of her worst psychosis and behavior seems to be related to acute cocaine and alcohol use although it also does appear that she has a chronic psychotic disorder.  Risk to Self:   Risk to Others:   Prior Inpatient Therapy:   Prior Outpatient Therapy:    Past Medical History:  Past Medical History:  Diagnosis Date  . Asthma   . Depression 2009   Inpatient psych admission for SI, dissociative fugue  . Dissociative disorder or reaction 2009  . Eczema   . H/O: suicide attempt   . ODD (oppositional defiant disorder)   . PTSD (post-traumatic stress disorder)   . Schizoaffective disorder (HCC)   . Substance abuse [facility])     Past Surgical History:  Procedure Laterality Date  . ADENOIDECTOMY    . TONSILLECTOMY     Family History:  Family History  Adopted: Yes  Problem Relation Age of Onset  . Mental illness Mother   . Mental illness Father   . Heart disease Maternal Grandmother   . Sickle cell trait Adoptive Mother   . Cleft palate Cousin  maternal side   Family Psychiatric  History: History of mental illness and several members of her family Social History:  Social History   Substance and Sexual Activity  Alcohol Use Yes  . Alcohol/week: 1.0 standard drink  . Types: 1 Cans of beer per week     Social History   Substance and Sexual Activity  Drug Use Yes  . Types: Marijuana, "Crack" cocaine, Heroin    Social History   Socioeconomic History  . Marital status: Single    Spouse name: Not on file  . Number of children: 1  . Years of education: Not on file  . Highest  education level: Not on file  Occupational History  . Not on file  Tobacco Use  . Smoking status: Former Smoker    Packs/day: 0.75    Years: 2.00    Pack years: 1.50    Types: Cigarettes  . Smokeless tobacco: Never Used  . Tobacco comment: pt states she is quitting today  Vaping Use  . Vaping Use: Never used  Substance and Sexual Activity  . Alcohol use: Yes    Alcohol/week: 1.0 standard drink    Types: 1 Cans of beer per week  . Drug use: Yes    Types: Marijuana, "Crack" cocaine, Heroin  . Sexual activity: Not Currently    Partners: Male  Other Topics Concern  . Not on file  Social History Narrative   Is homeless   Her mother is her daughter's gaurdian   Multiple psychiatric hospitalizations, not on any medication currently   Substance use disorder   Social Determinants of Health   Financial Resource Strain: Not on file  Food Insecurity: Not on file  Transportation Needs: Not on file  Physical Activity: Not on file  Stress: Not on file  Social Connections: Not on file   Additional Social History:    Allergies:   Allergies  Allergen Reactions  . Banana Hives and Swelling  . Cantaloupe (Diagnostic) Hives  . Grapefruit Extract Itching and Swelling  . Albumin Human Other (See Comments)    Pt refuses all blood products.   Rozanna Box Virosa]     Labs:  Results for orders placed or performed during the hospital encounter of 01/30/21 (from the past 48 hour(s))  Comprehensive metabolic panel     Status: Abnormal   Collection Time: 01/30/21 12:04 PM  Result Value Ref Range   Sodium 139 135 - 145 mmol/L   Potassium 3.6 3.5 - 5.1 mmol/L   Chloride 105 98 - 111 mmol/L   CO2 28 22 - 32 mmol/L   Glucose, Bld 107 (H) 70 - 99 mg/dL    Comment: Glucose reference range applies only to samples taken after fasting for at least 8 hours.   BUN 9 6 - 20 mg/dL   Creatinine, Ser 4.74 0.44 - 1.00 mg/dL   Calcium 8.9 8.9 - 25.9 mg/dL   Total Protein 7.7 6.5 - 8.1  g/dL   Albumin 3.9 3.5 - 5.0 g/dL   AST 25 15 - 41 U/L   ALT 9 0 - 44 U/L   Alkaline Phosphatase 60 38 - 126 U/L   Total Bilirubin 1.4 (H) 0.3 - 1.2 mg/dL   GFR, Estimated >56 >38 mL/min    Comment: (NOTE) Calculated using the CKD-EPI Creatinine Equation (2021)    Anion gap 6 5 - 15    Comment: Performed at [facility], [address] [place], [place] [address]  Ethanol  Status: None   Collection Time: 01/30/21 12:04 PM  Result Value Ref Range   Alcohol, Ethyl (B) <10 <10 mg/dL    Comment: (NOTE) Lowest detectable limit for serum alcohol is 10 mg/dL.  For medical purposes only. Performed at [facility], [address] [place], [place] [address]   Salicylate level     Status: Abnormal   Collection Time: 01/30/21 12:04 PM  Result Value Ref Range   Salicylate Lvl <7.0 (L) 7.0 - 30.0 mg/dL    Comment: Performed at [facility], [address] [place], [place] [address]  Acetaminophen level     Status: Abnormal   Collection Time: 01/30/21 12:04 PM  Result Value Ref Range   Acetaminophen (Tylenol), Serum <10 (L) 10 - 30 ug/mL    Comment: (NOTE) Therapeutic concentrations vary significantly. A range of 10-30 ug/mL  may be an effective concentration for many patients. However, some  are best treated at concentrations outside of this range. Acetaminophen concentrations >150 ug/mL at 4 hours after ingestion  and >50 ug/mL at 12 hours after ingestion are often associated with  toxic reactions.  Performed at [facility], [address] [place], [place] [address]   cbc     Status: None   Collection Time: 01/30/21 12:04 PM  Result Value Ref Range   WBC 7.8 4.0 - 10.5 K/uL   RBC 4.35 3.87 - 5.11 MIL/uL   Hemoglobin 13.6 12.0 - 15.0 g/dL   HCT 75.4 49.2 - 01.0 %   MCV 96.1 80.0 - 100.0 fL   MCH 31.3 26.0 - 34.0 pg   MCHC 32.5 30.0 - 36.0 g/dL   RDW 07.1 21.9 - 75.8 %   Platelets 263 150 - 400 K/uL   nRBC 0.0 0.0 - 0.2 %     Comment: Performed at [facility], [address] [place], [place] [address]    No current facility-administered medications for this encounter.   Current Outpatient Medications  Medication Sig Dispense Refill  . escitalopram (LEXAPRO) 20 MG tablet Take 1 tablet (20 mg total) by mouth daily. 30 tablet 1  . pseudoephedrine (SUDAFED) 30 MG tablet Take 1 tablet (30 mg total) by mouth every 6 (six) hours as needed for congestion. 24 tablet 0  . risperiDONE (RISPERDAL) 2 MG tablet Take 2 mg by mouth at bedtime.      Musculoskeletal: Strength & Muscle Tone: within normal limits Gait & Station: normal Patient leans: N/A  Psychiatric Specialty Exam: Physical Exam Vitals and nursing note reviewed.  Constitutional:      Appearance: She is well-developed and well-nourished.  HENT:     Head: Normocephalic and atraumatic.  Eyes:     Conjunctiva/sclera: Conjunctivae normal.     Pupils: Pupils are equal, round, and reactive to light.  Cardiovascular:     Heart sounds: Normal heart sounds.  Pulmonary:     Effort: Pulmonary effort is normal.  Abdominal:     Palpations: Abdomen is soft.  Musculoskeletal:        General: Normal range of motion.     Cervical back: Normal range of motion.  Skin:    General: Skin is warm and dry.  Neurological:     General: No focal deficit present.     Mental Status: She is alert.  Psychiatric:        Attention and Perception: She is inattentive.        Mood and Affect: Affect is labile.  Speech: Speech is tangential.        Behavior: Behavior is agitated. Behavior is not aggressive.        Thought Content: Thought content is delusional. Thought content includes homicidal and suicidal ideation. Thought content does not include homicidal or suicidal plan.        Cognition and Memory: Cognition is impaired. Memory is impaired.        Judgment: Judgment is impulsive and inappropriate.     Review of Systems  Constitutional: Negative.    HENT: Negative.   Eyes: Negative.   Respiratory: Negative.   Cardiovascular: Negative.   Gastrointestinal: Negative.   Musculoskeletal: Negative.   Skin: Negative.   Neurological: Negative.   Psychiatric/Behavioral: Positive for confusion, hallucinations, sleep disturbance and suicidal ideas.    Blood pressure 114/71, pulse 79, temperature 98.1 F (36.7 C), temperature source Oral, resp. rate 18, height 5\' 7"  (1.702 m), weight 61.2 kg, SpO2 95 %, unknown if currently breastfeeding.Body mass index is 21.13 kg/m.  General Appearance: Disheveled  Eye Contact:  Fair  Speech:  Normal Rate  Volume:  Increased  Mood:  Anxious  Affect:  Labile  Thought Process:  Disorganized  Orientation:  Full (Time, Place, and Person)  Thought Content:  Illogical, Delusions, Hallucinations: Auditory, Paranoid Ideation and Rumination  Suicidal Thoughts:  Yes.  without intent/plan  Homicidal Thoughts:  Yes.  without intent/plan  Memory:  Immediate;   Fair Recent;   Poor Remote;   Poor  Judgement:  Impaired  Insight:  Shallow  Psychomotor Activity:  Normal  Concentration:  Concentration: Poor  Recall:  of Knowledge:  Fair  Language:  Fair  Akathisia:  No  Handed:  Right  AIMS (if indicated):     Assets:  Desire for Improvement Housing Resilience  ADL's:  Impaired  Cognition:  Impaired,  Mild  Sleep:        Treatment Plan Summary: Medication management and Plan 30 year old woman with mental health and substance abuse problems.  She is currently presenting multiple psychotic symptoms.  Some of them so flagrant and that it raises the question of whether she could be exaggerating.  Nevertheless does seem to be functioning poorly and having disorganized thought and behavior.  Talks about being suicidal and homicidal but I do not think she is an immediate risk except insofar as not being able to care for herself.  Probably still actively abusing substances and has not been in psychiatric  treatment or on her medicine in a long time.  Recommend to the patient that we admit her to the inpatient psychiatric unit.  She agrees.  Restart Abilify.  Labs will be checked.  COVID test and drug screen among others still pending for now.  Disposition: Recommend psychiatric Inpatient admission when medically cleared.  37, MD 01/30/2021 2:28 PM

## 2021-01-30 NOTE — ED Notes (Signed)
Hourly rounding reveals patient in room. No complaints, stable, in no acute distress. Q15 minute rounds and monitoring via Security Cameras to continue. 

## 2021-01-30 NOTE — ED Notes (Signed)
[facility]  CONSULT  DONE  PT  MOVED  TO  [facility]  PENDING  PLACEMENT

## 2021-01-30 NOTE — ED Notes (Signed)
1 blue sweater, 1 striped t shirt, 1 blue sports bra, 1 pair blue jeans, 1 pair sandals, $91 cash placed in patients zipper compartment on pocketbook, 1 multicolored stone necklace placed in purse, 1 pair blue jeans 1 pair pink underwear, 1 pair black sandals.  1 red suitcase, 1 black bag, 1 black suit case, 1 beige purse.    Unable to get crematory ring off of patient and pt refusing to take out belly  Button ring.   Pt dressed out by this RN and Misty Stanley, EDT.

## 2021-01-30 NOTE — ED Notes (Signed)
Pt in bathroom giving urine sample.  Came out 10 min later with no sample.  Asking for dinner menu.

## 2021-01-30 NOTE — ED Notes (Addendum)
Report to include Situation, Background, Assessment, and Recommendations received from Amy RN. Patient alert and oriented, warm and dry, in no acute distress. Patient reported SI and HI without a specific plan. Denied AVH and pain. Patient made aware of Q15 minute rounds and security cameras for their safety. Patient instructed to come to me with needs or concerns.
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
## 2021-01-30 NOTE — ED Notes (Signed)
Patient asked why she is here today, reports she is SI , when asked when did her SI begin," patient answered it began since birth". Reports this is her last chance at trying to stay alive. Patient calm and cooperative awaiting psych consult and plan of care.
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
## 2021-01-30 NOTE — ED Notes (Signed)
Patient swabbed for covid.

## 2021-01-30 NOTE — ED Provider Notes (Signed)
[facility] Emergency Department Provider Note   ____________________________________________   Event Date/Time   First MD Initiated Contact with Patient 01/30/21 1238     (approximate)  I have reviewed the triage vital signs and the nursing notes.   HISTORY  Chief Complaint Psychiatric Evaluation    HPI Bridget Carpenter is a 30 y.o. female with past medical history of schizoaffective disorder, PTSD, polysubstance abuse, and asthma who presents to the ED for psychiatric evaluation.  Patient reports that she has having both homicidal and suicidal thoughts.  She states she is having thoughts of harming someone but would rather not say who.  She also reports thoughts of dying, denies any specific plan but states "I would like to die soon."  She additionally complains of cracked skin around both of her hands that she is concerned is infected.  She denies any swelling or drainage from her hands.  She denies any alcohol or drug abuse.        Past Medical History:  Diagnosis Date  . Asthma   . Depression 2009   Inpatient psych admission for SI, dissociative fugue  . Dissociative disorder or reaction 2009  . Eczema   . H/O: suicide attempt   . ODD (oppositional defiant disorder)   . PTSD (post-traumatic stress disorder)   . Schizoaffective disorder (HCC)   . Substance abuse [facility])     Patient Active Problem List   Diagnosis Date Noted  . Adjustment disorder   . MDD (major depressive disorder), recurrent episode, severe (HCC) 08/31/2019  . Indication for care in labor and delivery, antepartum 08/29/2019  . Hydrops fetalis 08/29/2019  . Indication for care in labor or delivery 08/14/2019  . Dehydration 08/14/2019  . Rubella non-immune status, antepartum 08/01/2019  . Pain of round ligament affecting pregnancy, antepartum 08/01/2019  . Labor and delivery indication for care or intervention 07/30/2019  . Homelessness 07/25/2019  . Decreased fetal  movement 07/02/2019  . Club foot, fetal, affecting care of mother, antepartum 06/29/2019  . Supervision of high risk pregnancy, antepartum 06/19/2019  . Cocaine abuse with cocaine-induced mood disorder (HCC) 04/15/2019  . 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/03/2018  . Bacterial vaginosis 12/25/2017  . PTSD (post-traumatic stress disorder) 09/02/2017  . Schizoaffective disorder (HCC) 09/01/2017  . MRSA carrier 07/18/2017  . Overdose 07/16/2017  . Aspiration pneumonia (HCC) 07/16/2017  . Acute respiratory failure (HCC) 07/16/2017  . Polysubstance abuse (HCC) 07/16/2017  . Eczema 05/08/2017  . Borderline personality disorder (HCC) 11/06/2016  . Cocaine use disorder, severe, dependence (HCC) 11/06/2016  . Cannabis use disorder, moderate, dependence (HCC) 11/06/2016  . Alcohol use disorder, mild, abuse 11/06/2016  . Asthma 09/23/2011  . Tobacco use disorder 09/15/2009    Past Surgical History:  Procedure Laterality Date  . ADENOIDECTOMY    . TONSILLECTOMY      Prior to Admission medications   Medication Sig Start Date End Date Taking? Authorizing Provider  escitalopram (LEXAPRO) 20 MG tablet Take 1 tablet (20 mg total) by mouth daily. 11/12/20   Clapacs, [name], MD  pseudoephedrine (SUDAFED) 30 MG tablet Take 1 tablet (30 mg total) by mouth every 6 (six) hours as needed for congestion. 09/27/20 09/27/21  Chesley Noon, MD  risperiDONE (RISPERDAL) 2 MG tablet Take 2 mg by mouth at bedtime. 06/04/20   [provider]    Allergies Banana, Cantaloupe (diagnostic), Grapefruit extract, Albumin human, and Collard greens [lactuca virosa]  Family History  Adopted: Yes  Problem Relation Age of  Onset  . Mental illness Mother   . Mental illness Father   . Heart disease Maternal Grandmother   . Sickle cell trait Adoptive Mother   . Cleft palate Cousin        maternal side    Social History Social History   Tobacco Use  . Smoking status: Former Smoker     Packs/day: 0.75    Years: 2.00    Pack years: 1.50    Types: Cigarettes  . Smokeless tobacco: Never Used  . Tobacco comment: pt states she is quitting today  Vaping Use  . Vaping Use: Never used  Substance Use Topics  . Alcohol use: Yes    Alcohol/week: 1.0 standard drink    Types: 1 Cans of beer per week  . Drug use: Yes    Types: Marijuana, "Crack" cocaine, Heroin    Review of Systems  Constitutional: No fever/chills Eyes: No visual changes. ENT: No sore throat. Cardiovascular: Denies chest pain. Respiratory: Denies shortness of breath. Gastrointestinal: No abdominal pain.  No nausea, no vomiting.  No diarrhea.  No constipation. Genitourinary: Negative for dysuria. Musculoskeletal: Negative for back pain. Skin: Negative for rash.  Positive for hand wounds. Neurological: Negative for headaches, focal weakness or numbness.  ____________________________________________   PHYSICAL EXAM:  VITAL SIGNS: ED Triage Vitals  Enc Vitals Group     BP 01/30/21 1200 114/71     Pulse Rate 01/30/21 1200 79     Resp 01/30/21 1200 18     Temp 01/30/21 1200 98.1 F (36.7 C)     Temp Source 01/30/21 1200 Oral     SpO2 01/30/21 1200 95 %     Weight 01/30/21 1200 134 lb 14.7 oz (61.2 kg)     Height 01/30/21 1200 5\' 7"  (1.702 m)     Head Circumference --      Peak Flow --      Pain Score 01/30/21 1200 9     Pain Loc --      Pain Edu? --      Excl. in GC? --     Constitutional: Alert and oriented. Eyes: Conjunctivae are normal. Head: Atraumatic. Nose: No congestion/rhinnorhea. Mouth/Throat: Mucous membranes are moist. Neck: Normal ROM Cardiovascular: Normal rate, regular rhythm. Grossly normal heart sounds. Respiratory: Normal respiratory effort.  No retractions. Lungs CTAB. Gastrointestinal: Soft and nontender. No distention. Genitourinary: deferred Musculoskeletal: No lower extremity tenderness nor edema. Neurologic:  Normal speech and language. No gross focal neurologic  deficits are appreciated. Skin: Dry skin with cracked fissure along right 4th knuckle and left palm.  No erythema, edema, warmth, or drainage noted. Psychiatric: Mood and affect are normal. Speech and behavior are normal.  ____________________________________________   LABS (all labs ordered are listed, but only abnormal results are displayed)  Labs Reviewed  COMPREHENSIVE METABOLIC PANEL - Abnormal; Notable for the following components:      Result Value   Glucose, Bld 107 (*)    Total Bilirubin 1.4 (*)    All other components within normal limits  SALICYLATE LEVEL - Abnormal; Notable for the following components:   Salicylate Lvl <7.0 (*)    All other components within normal limits  ACETAMINOPHEN LEVEL - Abnormal; Notable for the following components:   Acetaminophen (Tylenol), Serum <10 (*)    All other components within normal limits  ETHANOL  CBC  URINE DRUG SCREEN, QUALITATIVE ([facility] ONLY)  POC URINE PREG, ED    PROCEDURES  Procedure(s) performed (including Critical Care):  Procedures  ____________________________________________   INITIAL IMPRESSION / ASSESSMENT AND PLAN / ED COURSE       30 year old female with past medical history of schizoaffective disorder, PTSD, polysubstance abuse, and asthma who presents to the ED for psychiatric evaluation due to suicidal and homicidal ideation.  Patient is evasive in answering questions, unwilling to specify who she is having thoughts of harming and does not specify any specific plan of suicide.  We will have patient evaluated by psychiatry but hold off on IVC for now as she is calm and cooperative.  She is concerned about cracked skin along both hands but there is no evidence of infection at this time.  Screening labs are unremarkable and patient may be medically cleared for psychiatric evaluation.  The patient has been placed in psychiatric observation due to the need to provide a safe environment for the patient while  obtaining psychiatric consultation and evaluation, as well as ongoing medical and medication management to treat the patient's condition.  The patient has not been placed under full IVC at this time.       ____________________________________________   FINAL CLINICAL IMPRESSION(S) / ED DIAGNOSES  Final diagnoses:  Schizoaffective disorder, unspecified type [facility])     ED Discharge Orders    None       Note:  This document was prepared using Dragon voice recognition software and may include unintentional dictation errors.   Chesley Noon, MD 01/30/21 1302

## 2021-01-30 NOTE — ED Notes (Signed)
Pt transferred into ED BHU 5   Patient assigned to appropriate care area. Patient oriented to unit/care area: Informed that, for her safety, care areas are designed for safety and monitored by security cameras at all times; Visiting hours and phone times explained to patient. Patient verbalizes understanding, and verbal contract for safety obtained.  Assessment completed  She denies pain

## 2021-01-30 NOTE — ED Triage Notes (Signed)
Pt to ED via POV with multiple suitcases and bags requesting behavioral med eval.

## 2021-01-31 ENCOUNTER — Inpatient Hospital Stay
Admission: AD | Admit: 2021-01-31 | Discharge: 2021-02-08 | DRG: 897 | Disposition: A | Discharge status: Home / Self Care | Payer: No Typology Code available for payment source | Source: Ambulatory Visit | Attending: Behavioral Health | Admitting: Behavioral Health

## 2021-01-31 ENCOUNTER — Encounter: Payer: Self-pay | Admitting: Psychiatry

## 2021-01-31 DIAGNOSIS — F1590 Other stimulant use, unspecified, uncomplicated: Secondary | ICD-10-CM | POA: Diagnosis present

## 2021-01-31 DIAGNOSIS — F4310 Post-traumatic stress disorder, unspecified: Secondary | ICD-10-CM | POA: Diagnosis present

## 2021-01-31 DIAGNOSIS — Z9151 Personal history of suicidal behavior: Secondary | ICD-10-CM | POA: Diagnosis not present

## 2021-01-31 DIAGNOSIS — F15159 Other stimulant abuse with stimulant-induced psychotic disorder, unspecified: Secondary | ICD-10-CM | POA: Diagnosis present

## 2021-01-31 DIAGNOSIS — F250 Schizoaffective disorder, bipolar type: Secondary | ICD-10-CM | POA: Diagnosis not present

## 2021-01-31 DIAGNOSIS — Z87891 Personal history of nicotine dependence: Secondary | ICD-10-CM | POA: Diagnosis not present

## 2021-01-31 DIAGNOSIS — F603 Borderline personality disorder: Secondary | ICD-10-CM | POA: Diagnosis present

## 2021-01-31 DIAGNOSIS — F5105 Insomnia due to other mental disorder: Secondary | ICD-10-CM | POA: Diagnosis present

## 2021-01-31 DIAGNOSIS — R45851 Suicidal ideations: Secondary | ICD-10-CM | POA: Diagnosis present

## 2021-01-31 DIAGNOSIS — F14259 Cocaine dependence with cocaine-induced psychotic disorder, unspecified: Secondary | ICD-10-CM | POA: Diagnosis present

## 2021-01-31 DIAGNOSIS — F411 Generalized anxiety disorder: Secondary | ICD-10-CM | POA: Diagnosis present

## 2021-01-31 DIAGNOSIS — R45850 Homicidal ideations: Secondary | ICD-10-CM | POA: Diagnosis present

## 2021-01-31 DIAGNOSIS — F259 Schizoaffective disorder, unspecified: Secondary | ICD-10-CM | POA: Diagnosis not present

## 2021-01-31 DIAGNOSIS — Z818 Family history of other mental and behavioral disorders: Secondary | ICD-10-CM | POA: Diagnosis not present

## 2021-01-31 DIAGNOSIS — F1420 Cocaine dependence, uncomplicated: Secondary | ICD-10-CM | POA: Diagnosis present

## 2021-01-31 DIAGNOSIS — Z8249 Family history of ischemic heart disease and other diseases of the circulatory system: Secondary | ICD-10-CM | POA: Diagnosis not present

## 2021-01-31 DIAGNOSIS — F4312 Post-traumatic stress disorder, chronic: Secondary | ICD-10-CM | POA: Diagnosis present

## 2021-01-31 DIAGNOSIS — F19959 Other psychoactive substance use, unspecified with psychoactive substance-induced psychotic disorder, unspecified: Secondary | ICD-10-CM | POA: Diagnosis present

## 2021-01-31 DIAGNOSIS — F1220 Cannabis dependence, uncomplicated: Secondary | ICD-10-CM | POA: Diagnosis present

## 2021-01-31 MED ORDER — HYDROXYZINE HCL 50 MG PO TABS
50.0000 mg | ORAL_TABLET | Freq: Three times a day (TID) | ORAL | Status: DC | PRN
Start: 2021-01-31 — End: 2021-02-08
  Administered 2021-02-02 – 2021-02-07 (×4): 50 mg via ORAL
  Filled 2021-01-31 (×5): qty 1

## 2021-01-31 MED ORDER — ALUM & MAG HYDROXIDE-SIMETH 200-200-20 MG/5ML PO SUSP: 30.0000 mL | ORAL | Status: DC | PRN

## 2021-01-31 MED ORDER — ESCITALOPRAM OXALATE 10 MG PO TABS
30.0000 mg | ORAL_TABLET | Freq: Every day | ORAL | Status: DC
  Administered 2021-02-01 – 2021-02-08 (×8): 30 mg via ORAL
  Filled 2021-01-31 (×8): qty 3

## 2021-01-31 MED ORDER — TRIAMCINOLONE ACETONIDE 0.025 % EX CREA
TOPICAL_CREAM | Freq: Two times a day (BID) | CUTANEOUS | Status: DC
  Administered 2021-01-31 – 2021-02-06 (×6): 1 via TOPICAL
  Filled 2021-01-31 (×2): qty 15

## 2021-01-31 MED ORDER — IBUPROFEN 800 MG PO TABS
800.0000 mg | ORAL_TABLET | Freq: Every day | ORAL | Status: DC | PRN
  Administered 2021-01-31: 800 mg via ORAL
  Filled 2021-01-31: qty 1

## 2021-01-31 MED ORDER — INFLUENZA VAC SPLIT QUAD 0.5 ML IM SUSY
0.5000 mL | PREFILLED_SYRINGE | INTRAMUSCULAR | Status: DC
  Filled 2021-01-31 (×2): qty 0.5

## 2021-01-31 MED ORDER — ESCITALOPRAM OXALATE 10 MG PO TABS
20.0000 mg | ORAL_TABLET | Freq: Every day | ORAL | Status: DC
Start: 2021-01-31 — End: 2021-01-31
  Administered 2021-01-31: 20 mg via ORAL
  Filled 2021-01-31: qty 2

## 2021-01-31 MED ORDER — TRAZODONE HCL 100 MG PO TABS
100.0000 mg | ORAL_TABLET | Freq: Every evening | ORAL | Status: DC | PRN
  Administered 2021-02-01 – 2021-02-07 (×7): 100 mg via ORAL
  Filled 2021-01-31 (×9): qty 1

## 2021-01-31 MED ORDER — HYDROCERIN EX CREA
TOPICAL_CREAM | Freq: Two times a day (BID) | CUTANEOUS | Status: DC | PRN
  Administered 2021-02-06: 1 via TOPICAL
  Filled 2021-01-31: qty 113

## 2021-01-31 MED ORDER — ARIPIPRAZOLE 10 MG PO TABS
10.0000 mg | ORAL_TABLET | Freq: Two times a day (BID) | ORAL | Status: DC
  Filled 2021-01-31 (×3): qty 1

## 2021-01-31 MED ORDER — ACETAMINOPHEN 325 MG PO TABS: 650.0000 mg | ORAL_TABLET | Freq: Four times a day (QID) | ORAL | Status: DC | PRN

## 2021-01-31 MED ORDER — IBUPROFEN 200 MG PO TABS
400.0000 mg | ORAL_TABLET | Freq: Four times a day (QID) | ORAL | Status: DC | PRN
  Administered 2021-01-31 – 2021-02-05 (×3): 400 mg via ORAL
  Filled 2021-01-31 (×3): qty 2

## 2021-01-31 MED ORDER — MELATONIN 5 MG PO TABS
5.0000 mg | ORAL_TABLET | Freq: Every day | ORAL | Status: DC
  Administered 2021-01-31: 5 mg via ORAL
  Filled 2021-01-31: qty 1

## 2021-01-31 MED ORDER — MAGNESIUM HYDROXIDE 400 MG/5ML PO SUSP: 30.0000 mL | Freq: Every day | ORAL | Status: DC | PRN
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
## 2021-01-31 NOTE — ED Notes (Signed)
Hourly rounding reveals patient in room. No complaints, stable, in no acute distress. Q15 minute rounds and monitoring via Security Cameras to continue. 

## 2021-01-31 NOTE — BHH Group Notes (Signed)
LCSW Group Therapy Note  01/31/2021 2:07 PM  Type of Therapy and Topic:  Group Therapy:  Feelings around Relapse and Recovery  Participation Level:  Active   Description of Group:    Patients in this group will discuss emotions they experience before and after a relapse. They will process how experiencing these feelings, or avoidance of experiencing them, relates to having a relapse. Facilitator will guide patients to explore emotions they have related to recovery. Patients will be encouraged to process which emotions are more powerful. They will be guided to discuss the emotional reaction significant others in their lives may have to their relapse or recovery. Patients will be assisted in exploring ways to respond to the emotions of others without this contributing to a relapse.  Therapeutic Goals: 1. Patient will identify two or more emotions that lead to a relapse for them 2. Patient will identify two emotions that result when they relapse 3. Patient will identify two emotions related to recovery 4. Patient will demonstrate ability to communicate their needs through discussion and/or role plays   Summary of Patient Progress: Patient was present for the entirety of the group session. Patient was an active listener and participated in the topic of discussion. Patient contributed to topic of discussion and responded to others in group. During group, patient appeared to be responding to internal stimuli, patients eyes would occasionally glance towards empty wall.   Therapeutic Modalities:   Cognitive Behavioral Therapy Solution-Focused Therapy Assertiveness Training Relapse Prevention Therapy   Gwenevere Ghazi, MSW, [place], [place] 01/31/2021 2:07 PM

## 2021-01-31 NOTE — ED Notes (Signed)
Report given to [place], RN, pt going to rm [id number].

## 2021-01-31 NOTE — Progress Notes (Signed)
Pt requesting additional soap. When told she was already given soap in her bag of toiletries, she becomes agitated. Additional bottle given.

## 2021-01-31 NOTE — ED Notes (Signed)
Vol/pending inpatient admit when medically cleared.

## 2021-01-31 NOTE — Progress Notes (Signed)
BRIEF PHARMACY NOTE   This patient attended and participated in Medication Management Group counseling led by [facility] staff pharmacist.  This interactive class reviews basic information about prescription medications and education on personal responsibility in medication management.  The class also includes general knowledge of 3 main classes of behavioral medications, including antipsychotics, antidepressants, and mood stabilizers.     Patient behavior was appropriate for group setting.   Educational materials sourced from:  "Medication Do's and Don'ts" from [employer]-PASS.COM   "Mental Health Medications" from [employer] Https://www.nimh.nih.gov/health/topics/mental-health-medications/index.shtml#part 149856    Chantille Navarrete M Sarahlynn Cisnero, PharmD, BCPS Clinical Pharmacist 01/31/2021 3:05 PM  
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
## 2021-01-31 NOTE — ED Notes (Addendum)
Attempted to call report to BMU nurses but phone just ringing with no answer, will attempt again in about 
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
## 2021-01-31 NOTE — Tx Team (Signed)
Initial Treatment Plan 01/31/2021 12:32 PM Bridget Carpenter [name] JDY:[id number]  PATIENT STRESSORS: Financial difficulties Substance abuse   PATIENT STRENGTHS: Capable of independent living Motivation for treatment/growth   PATIENT IDENTIFIED PROBLEMS: Substance abuse  Living situation - homelessness  Suicidal ideation                 DISCHARGE CRITERIA:  Ability to meet basic life and health needs Adequate post-discharge living arrangements Improved stabilization in mood, thinking, and/or behavior Reduction of life-threatening or endangering symptoms to within safe limits  PRELIMINARY DISCHARGE PLAN: Outpatient therapy Placement in alternative living arrangements  PATIENT/FAMILY INVOLVEMENT: This treatment plan has been presented to and reviewed with the patient, Bridget Carpenter Bridget Carpenter. The patient has been given the opportunity to ask questions and make suggestions.  Celene Kras, RN 01/31/2021, 12:32 PM

## 2021-01-31 NOTE — ED Notes (Signed)
Dr Toni Amend talking with pt in dayroom

## 2021-01-31 NOTE — ED Notes (Addendum)
Pt informed of admission to BMU and pt is agreeable to it. Pt given all her personal belongings and was transported to [facility] via wheelchair escorted by [place], NT and security.  Pt signed voluntary admission form.

## 2021-01-31 NOTE — ED Notes (Signed)
Pt refused abilify stating "I don't take abilify, take that out".

## 2021-01-31 NOTE — Consult Note (Signed)
[facility] Face-to-Face Psychiatry Consult   Reason for Consult: Follow-up 30 year old woman with mood and psychotic and substance abuse problems.   Referring Physician: Su Hoff Patient Identification: [place] Bridget Carpenter MRN:  [id number] Principal Diagnosis: Schizoaffective disorder (HCC) Diagnosis:  Principal Problem:   Schizoaffective disorder (HCC) Active Problems:   Alcohol use disorder, mild, abuse   Cocaine abuse with cocaine-induced mood disorder (HCC)   Total Time spent with patient: 30 minutes  Subjective:   Bridget Carpenter is a 30 y.o. female patient admitted with "when can I go downstairs?".  HPI: Patient seen chart reviewed.  She remains animated with giggly somewhat inappropriate affect disorganized thinking.  She has been cooperative not aggressive or violent.  She is requesting to be restarted on Lexapro.  On my review of the medication I do see that that was a medication that has been tried in the past.  No report of it having made her symptoms worse.  She is also complaining of pain but has trouble being very clear and localizing where it is coming from.  She wants Motrin apparently for back pain which she had not really been complaining of before.  Past Psychiatric History: Past history of longstanding psychotic mood instability and substance abuse  Risk to Self:   Risk to Others:   Prior Inpatient Therapy:   Prior Outpatient Therapy:    Past Medical History:  Past Medical History:  Diagnosis Date  . Asthma   . Depression 2009   Inpatient psych admission for SI, dissociative fugue  . Dissociative disorder or reaction 2009  . Eczema   . H/O: suicide attempt   . ODD (oppositional defiant disorder)   . PTSD (post-traumatic stress disorder)   . Schizoaffective disorder (HCC)   . Substance abuse [facility])     Past Surgical History:  Procedure Laterality Date  . ADENOIDECTOMY    . TONSILLECTOMY     Family History:  Family History  Adopted: Yes  Problem  Relation Age of Onset  . Mental illness Mother   . Mental illness Father   . Heart disease Maternal Grandmother   . Sickle cell trait Adoptive Mother   . Cleft palate Cousin        maternal side   Family Psychiatric  History: See previous Social History:  Social History   Substance and Sexual Activity  Alcohol Use Yes  . Alcohol/week: 1.0 standard drink  . Types: 1 Cans of beer per week     Social History   Substance and Sexual Activity  Drug Use Yes  . Types: Marijuana, "Crack" cocaine, Heroin    Social History   Socioeconomic History  . Marital status: Single    Spouse name: Not on file  . Number of children: 1  . Years of education: Not on file  . Highest education level: Not on file  Occupational History  . Not on file  Tobacco Use  . Smoking status: Former Smoker    Packs/day: 0.75    Years: 2.00    Pack years: 1.50    Types: Cigarettes  . Smokeless tobacco: Never Used  . Tobacco comment: pt states she is quitting today  Vaping Use  . Vaping Use: Never used  Substance and Sexual Activity  . Alcohol use: Yes    Alcohol/week: 1.0 standard drink    Types: 1 Cans of beer per week  . Drug use: Yes    Types: Marijuana, "Crack" cocaine, Heroin  . Sexual activity: Not Currently    Partners:  Male  Other Topics Concern  . Not on file  Social History Narrative   Is homeless   Her mother is her daughter's gaurdian   Multiple psychiatric hospitalizations, not on any medication currently   Substance use disorder   Social Determinants of Health   Financial Resource Strain: Not on file  Food Insecurity: Not on file  Transportation Needs: Not on file  Physical Activity: Not on file  Stress: Not on file  Social Connections: Not on file   Additional Social History:    Allergies:   Allergies  Allergen Reactions  . Banana Hives and Swelling  . Cantaloupe (Diagnostic) Hives  . Grapefruit Extract Itching and Swelling  . Albumin Human Other (See Comments)     Pt refuses all blood products.   Rozanna Box Virosa]     Labs:  Results for orders placed or performed during the hospital encounter of 01/30/21 (from the past 48 hour(s))  Comprehensive metabolic panel     Status: Abnormal   Collection Time: 01/30/21 12:04 PM  Result Value Ref Range   Sodium 139 135 - 145 mmol/L   Potassium 3.6 3.5 - 5.1 mmol/L   Chloride 105 98 - 111 mmol/L   CO2 28 22 - 32 mmol/L   Glucose, Bld 107 (H) 70 - 99 mg/dL    Comment: Glucose reference range applies only to samples taken after fasting for at least 8 hours.   BUN 9 6 - 20 mg/dL   Creatinine, Ser 9.79 0.44 - 1.00 mg/dL   Calcium 8.9 8.9 - 48.0 mg/dL   Total Protein 7.7 6.5 - 8.1 g/dL   Albumin 3.9 3.5 - 5.0 g/dL   AST 25 15 - 41 U/L   ALT 9 0 - 44 U/L   Alkaline Phosphatase 60 38 - 126 U/L   Total Bilirubin 1.4 (H) 0.3 - 1.2 mg/dL   GFR, Estimated >16 >55 mL/min    Comment: (NOTE) Calculated using the CKD-EPI Creatinine Equation (2021)    Anion gap 6 5 - 15    Comment: Performed at [facility], [address] [place], [place] [address]  Ethanol     Status: None   Collection Time: 01/30/21 12:04 PM  Result Value Ref Range   Alcohol, Ethyl (B) <10 <10 mg/dL    Comment: (NOTE) Lowest detectable limit for serum alcohol is 10 mg/dL.  For medical purposes only. Performed at [facility], [address] [place], [place] [address]   Salicylate level     Status: Abnormal   Collection Time: 01/30/21 12:04 PM  Result Value Ref Range   Salicylate Lvl <7.0 (L) 7.0 - 30.0 mg/dL    Comment: Performed at [facility], [address] [place], [place] [address]  Acetaminophen level     Status: Abnormal   Collection Time: 01/30/21 12:04 PM  Result Value Ref Range   Acetaminophen (Tylenol), Serum <10 (L) 10 - 30 ug/mL    Comment: (NOTE) Therapeutic concentrations vary significantly. A range of 10-30 ug/mL  may be an effective concentration for many patients.  However, some  are best treated at concentrations outside of this range. Acetaminophen concentrations >150 ug/mL at 4 hours after ingestion  and >50 ug/mL at 12 hours after ingestion are often associated with  toxic reactions.  Performed at [facility], [address] [place], [place] [address]   cbc     Status: None   Collection Time: 01/30/21 12:04 PM  Result Value Ref  Range   WBC 7.8 4.0 - 10.5 K/uL   RBC 4.35 3.87 - 5.11 MIL/uL   Hemoglobin 13.6 12.0 - 15.0 g/dL   HCT 70.2 63.7 - 85.8 %   MCV 96.1 80.0 - 100.0 fL   MCH 31.3 26.0 - 34.0 pg   MCHC 32.5 30.0 - 36.0 g/dL   RDW 85.0 27.7 - 41.2 %   Platelets 263 150 - 400 K/uL   nRBC 0.0 0.0 - 0.2 %    Comment: Performed at [facility], [address] [place], [place] [address]  Lipid panel     Status: None   Collection Time: 01/30/21 12:04 PM  Result Value Ref Range   Cholesterol 128 0 - 200 mg/dL   Triglycerides 35 <672 mg/dL   HDL 99 >09 mg/dL   Total CHOL/HDL Ratio 1.3 RATIO   VLDL 7 0 - 40 mg/dL   LDL Cholesterol 22 0 - 99 mg/dL    Comment:        Total Cholesterol/HDL:CHD Risk Coronary Heart Disease Risk Table                     Men   Women  1/2 Average Risk   3.4   3.3  Average Risk       5.0   4.4  2 X Average Risk   9.6   7.1  3 X Average Risk  23.4   11.0        Use the calculated Patient Ratio above and the CHD Risk Table to determine the patient's CHD Risk.        ATP III CLASSIFICATION (LDL):  <100     mg/dL   Optimal  470-962  mg/dL   Near or Above                    Optimal  130-159  mg/dL   Borderline  836-629  mg/dL   High  >476     mg/dL   Very High Performed at [facility], [address] [place], [place] [address]   Hemoglobin A1c     Status: None   Collection Time: 01/30/21 12:04 PM  Result Value Ref Range   Hgb A1c MFr Bld 5.1 4.8 - 5.6 %    Comment: (NOTE) Pre diabetes:          5.7%-6.4%  Diabetes:              >6.4%  Glycemic control for    <7.0% adults with diabetes    Mean Plasma Glucose 99.67 mg/dL    Comment: Performed at [facility], [address], [place], [place] [address]  SARS Coronavirus 2 by RT PCR (hospital order, performed in [facility] hospital lab) Nasopharyngeal Nasopharyngeal Swab     Status: None   Collection Time: 01/30/21  2:43 PM   Specimen: Nasopharyngeal Swab  Result Value Ref Range   SARS Coronavirus 2 NEGATIVE NEGATIVE    Comment: (NOTE) SARS-CoV-2 target nucleic acids are NOT DETECTED.  The SARS-CoV-2 RNA is generally detectable in upper and lower respiratory specimens during the acute phase of infection. The lowest concentration of SARS-CoV-2 viral copies this assay can detect is 250 copies / mL. A negative result does not preclude SARS-CoV-2 infection and should not be used as the sole basis for treatment or other patient management decisions.  A negative result may occur with improper specimen collection / handling, submission of specimen other than nasopharyngeal  swab, presence of viral mutation(s) within the areas targeted by this assay, and inadequate number of viral copies (<250 copies / mL). A negative result must be combined with clinical observations, patient history, and epidemiological information.  Fact Sheet for Patients:   BoilerBrush.com.cyhttps://www.fda.gov/media/136312/download  Fact Sheet for Healthcare Providers: https://pope.com/https://www.fda.gov/media/136313/download  This test is not yet approved or  cleared by the [place] FDA and has been authorized for detection and/or diagnosis of SARS-CoV-2 by FDA under an Emergency Use Authorization (EUA).  This EUA will remain in effect (meaning this test can be used) for the duration of the COVID-19 declaration under Section 564(b)(1) of the Act, 21 U.S.C. section 360bbb-3(b)(1), unless the authorization is terminated or revoked sooner.  Performed at [facility], [address] [place], [place] [address]     Current  Facility-Administered Medications  Medication Dose Route Frequency Provider Last Rate Last Admin  . ARIPiprazole (ABILIFY) tablet 10 mg  10 mg Oral BID Clapacs, John T, MD      . escitalopram (LEXAPRO) tablet 20 mg  20 mg Oral Daily Clapacs, John T, MD      . ibuprofen (ADVIL) tablet 800 mg  800 mg Oral Daily PRN Clapacs, Jackquline DenmarkJohn T, MD       Current Outpatient Medications  Medication Sig Dispense Refill  . escitalopram (LEXAPRO) 20 MG tablet Take 1 tablet (20 mg total) by mouth daily. 30 tablet 1  . pseudoephedrine (SUDAFED) 30 MG tablet Take 1 tablet (30 mg total) by mouth every 6 (six) hours as needed for congestion. (Patient not taking: Reported on 01/31/2021) 24 tablet 0  . risperiDONE (RISPERDAL) 2 MG tablet Take 2 mg by mouth at bedtime. (Patient not taking: No sig reported)      Musculoskeletal: Strength & Muscle Tone: within normal limits Gait & Station: normal Patient leans: N/A  Psychiatric Specialty Exam: Physical Exam Vitals and nursing note reviewed.  Constitutional:      Appearance: She is well-developed and well-nourished.  HENT:     Head: Normocephalic and atraumatic.  Eyes:     Conjunctiva/sclera: Conjunctivae normal.     Pupils: Pupils are equal, round, and reactive to light.  Cardiovascular:     Heart sounds: Normal heart sounds.  Pulmonary:     Effort: Pulmonary effort is normal.  Abdominal:     Palpations: Abdomen is soft.  Musculoskeletal:        General: Normal range of motion.     Cervical back: Normal range of motion.  Skin:    General: Skin is warm and dry.  Neurological:     General: No focal deficit present.     Mental Status: She is alert.  Psychiatric:        Attention and Perception: She is inattentive.        Mood and Affect: Affect is labile and inappropriate.        Speech: Speech normal.        Behavior: Behavior is agitated. Behavior is not aggressive.        Thought Content: Thought content is not paranoid. Thought content includes  suicidal ideation. Thought content does not include homicidal ideation. Thought content does not include suicidal plan.        Cognition and Memory: Cognition is impaired.     Review of Systems  Constitutional: Negative.   HENT: Negative.   Eyes: Negative.   Respiratory: Negative.   Cardiovascular: Negative.   Gastrointestinal: Negative.   Musculoskeletal: Positive for back pain.  Skin: Negative.  Neurological: Negative.   Psychiatric/Behavioral: Positive for confusion and dysphoric mood.    Blood pressure (!) 99/48, pulse 70, temperature 97.7 F (36.5 C), temperature source Oral, resp. rate 16, height 5\' 7"  (1.702 m), weight 61.2 kg, SpO2 100 %, unknown if currently breastfeeding.Body mass index is 21.13 kg/m.  General Appearance: Casual  Eye Contact:  Fair  Speech:  Clear and Coherent  Volume:  Increased  Mood:  Euphoric and Irritable  Affect:  Congruent  Thought Process:  Disorganized  Orientation:  Full (Time, Place, and Person)  Thought Content:  Illogical  Suicidal Thoughts:  Yes.  without intent/plan  Homicidal Thoughts:  No  Memory:  Immediate;   Fair Recent;   Poor Remote;   Poor  Judgement:  Impaired  Insight:  Shallow  Psychomotor Activity:  Restlessness  Concentration:  Concentration: Fair  Recall:  Poor  Fund of Knowledge:  Poor  Language:  Good  Akathisia:  No  Handed:  Right  AIMS (if indicated):     Assets:  Desire for Improvement Resilience  ADL's:  Impaired  Cognition:  Impaired,  Mild  Sleep:        Treatment Plan Summary: Medication management and Plan Orders placed to restart the Lexapro 20 mg a day.  Also Motrin 800 mg but only once a day as needed.  Patient has orders for admission to the psychiatric ward downstairs.  Reviewed with treatment team that we hopefully will be able to get her moved downstairs as soon as possible.  Disposition: Recommend psychiatric Inpatient admission when medically cleared. Supportive therapy provided about  ongoing stressors.  , MD 01/31/2021 9:57 AM

## 2021-01-31 NOTE — Progress Notes (Signed)
   01/31/21 1350  Clinical Encounter Type  Visited With Patient  Visit Type Initial;Spiritual support;Social support  Referral From Chaplain  Consult/Referral To Chaplain  Ch Greg and Ch Melanie Provide Ch group and Pt attended. Ch will follow up with Pt.  

## 2021-01-31 NOTE — Progress Notes (Signed)
Pt refused to take Abilify, stating "I don't take that medication. I take escitalopram".  Pt was educated on Abilify as another medication ordered for her in addition to escitalopram and encouraged to take it, but pt still refused.

## 2021-02-01 DIAGNOSIS — F4310 Post-traumatic stress disorder, unspecified: Secondary | ICD-10-CM | POA: Diagnosis not present

## 2021-02-01 DIAGNOSIS — F5105 Insomnia due to other mental disorder: Secondary | ICD-10-CM | POA: Diagnosis present

## 2021-02-01 MED ORDER — ADULT MULTIVITAMIN W/MINERALS CH
1.0000 | ORAL_TABLET | Freq: Every day | ORAL | Status: DC
  Administered 2021-02-01 – 2021-02-07 (×4): 1 via ORAL
  Filled 2021-02-01 (×6): qty 1

## 2021-02-01 MED ORDER — MELATONIN 5 MG PO TABS
10.0000 mg | ORAL_TABLET | Freq: Every day | ORAL | Status: DC
  Administered 2021-02-01 – 2021-02-07 (×7): 10 mg via ORAL
  Filled 2021-02-01 (×7): qty 2

## 2021-02-01 MED ORDER — ENSURE ENLIVE PO LIQD
237.0000 mL | Freq: Two times a day (BID) | ORAL | Status: DC
  Administered 2021-02-01 – 2021-02-08 (×9): 237 mL via ORAL

## 2021-02-01 NOTE — [facility] Suicide Risk Assessment (Signed)
[facility] INPATIENT:  Family/Significant Other Suicide Prevention Education  Suicide Prevention Education:  Patient Refusal for Family/Significant Other Suicide Prevention Education: The patient [name] has refused to provide written consent for family/significant other to be provided Family/Significant Other Suicide Prevention Education during admission and/or prior to discharge.  Physician notified.  SPE completed with pt, as pt refused to consent to family contact. SPI pamphlet provided to pt and pt was encouraged to share information with support network, ask questions, and talk about any concerns relating to SPE. Pt denies access to guns/firearms and verbalized understanding of information provided. Mobile Crisis information also provided to pt.   Harden Mo 02/01/2021, 1:04 PM

## 2021-02-01 NOTE — Progress Notes (Signed)
Recreation Therapy Notes  INPATIENT RECREATION TR PLAN  Patient Details Name: [name] Shayan Delage MRN: [id number] DOB: 02/24/1991 Today's Date: 02/01/2021  Rec Therapy Plan Is patient appropriate for Therapeutic Recreation?: Yes Treatment times per week: at least 3 Estimated Length of Stay: 5-7 days TR Treatment/Interventions: Group participation (Comment)  Discharge Criteria Pt will be discharged from therapy if:: Discharged Treatment plan/goals/alternatives discussed and agreed upon by:: Patient/family  Discharge Summary     Izzabell Klasen 02/01/2021, 12:15 PM

## 2021-02-01 NOTE — Progress Notes (Signed)
Pt is animated in the morning and is observed to be interacting appropriately with others on the unit. Pt states she has suicidal thoughts while smiling at this Clinical research associate. She is also observed to be responding to internal stimuli and talks to herself while in her room. Pt is pleasant at the time this writer was speaking to her, but her mood is labile.   Pt refuses to take Abilify, but is compliant with other medications. Support and encouragement is provided.   Pt remains safe on the unit at this time and contracts for safety. Q15 minute safety checks are maintained.

## 2021-02-01 NOTE — Tx Team (Addendum)
Interdisciplinary Treatment and Diagnostic Plan Update  02/01/2021 Time of Session: 09:00 Bridget Shayan Breeland MRN: [id number]  Principal Diagnosis: PTSD (post-traumatic stress disorder)  Secondary Diagnoses: Principal Problem:   PTSD (post-traumatic stress disorder) Active Problems:   Borderline personality disorder (HCC)   Cocaine use disorder, severe, dependence ([place])   Cannabis use disorder, moderate, dependence ([place])   Psychoactive substance-induced psychosis ([place])   Stimulant use disorder   Current Medications:  Current Facility-Administered Medications  Medication Dose Route Frequency Provider Last Rate Last Admin  . alum & mag hydroxide-simeth (MAALOX/MYLANTA) 200-200-20 MG/5ML suspension 30 mL  30 mL Oral Q4H PRN Clapacs, John T, MD      . ARIPiprazole (ABILIFY) tablet 10 mg  10 mg Oral BID Clapacs, John T, MD      . escitalopram (LEXAPRO) tablet 30 mg  30 mg Oral Daily Salley Scarlet, MD   30 mg at 02/01/21 0804  . hydrocerin (EUCERIN) cream   Topical BID PRN Salley Scarlet, MD      . hydrOXYzine (ATARAX/VISTARIL) tablet 50 mg  50 mg Oral TID PRN Clapacs, Madie [place], MD      . ibuprofen (ADVIL) tablet 400 mg  400 mg Oral Q6H PRN Salley Scarlet, MD   400 mg at 01/31/21 1810  . influenza vac split quadrivalent PF (FLUARIX) injection 0.5 mL  0.5 mL Intramuscular Tomorrow-1000 Salley Scarlet, MD      . magnesium hydroxide (MILK OF MAGNESIA) suspension 30 mL  30 mL Oral Daily PRN Clapacs, John T, MD      . melatonin tablet 5 mg  5 mg Oral QHS Salley Scarlet, MD   5 mg at 01/31/21 2111  . traZODone (DESYREL) tablet 100 mg  100 mg Oral QHS PRN Clapacs, John T, MD      . triamcinolone (KENALOG) 0.025 % cream   Topical BID Salley Scarlet, MD   1 application at 35/32/99 0806   PTA Medications: Medications Prior to Admission  Medication Sig Dispense Refill Last Dose  . escitalopram (LEXAPRO) 20 MG tablet Take 1 tablet (20 mg total) by mouth daily. 30 tablet 1   .  pseudoephedrine (SUDAFED) 30 MG tablet Take 1 tablet (30 mg total) by mouth every 6 (six) hours as needed for congestion. (Patient not taking: Reported on 01/31/2021) 24 tablet 0   . risperiDONE (RISPERDAL) 2 MG tablet Take 2 mg by mouth at bedtime. (Patient not taking: No sig reported)       Patient Stressors: Financial difficulties Substance abuse  Patient Strengths: Capable of independent living Motivation for treatment/growth  Treatment Modalities: Medication Management, Group therapy, Case management,  1 to 1 session with clinician, Psychoeducation, Recreational therapy.   Physician Treatment Plan for Primary Diagnosis: PTSD (post-traumatic stress disorder) Long Term Goal(s): Improvement in symptoms so as ready for discharge Improvement in symptoms so as ready for discharge   Short Term Goals: Ability to identify changes in lifestyle to reduce recurrence of condition will improve Ability to verbalize feelings will improve Ability to disclose and discuss suicidal ideas Ability to demonstrate self-control will improve Ability to identify and develop effective coping behaviors will improve Compliance with prescribed medications will improve Ability to identify triggers associated with substance abuse/mental health issues will improve Ability to identify changes in lifestyle to reduce recurrence of condition will improve Ability to verbalize feelings will improve Ability to disclose and discuss suicidal ideas Ability to demonstrate self-control will improve Ability to identify and develop effective coping behaviors will  improve Compliance with prescribed medications will improve Ability to identify triggers associated with substance abuse/mental health issues will improve  Medication Management: Evaluate patient's response, side effects, and tolerance of medication regimen.  Therapeutic Interventions: 1 to 1 sessions, Unit Group sessions and Medication administration.  Evaluation  of Outcomes: Not Met  Physician Treatment Plan for Secondary Diagnosis: Principal Problem:   PTSD (post-traumatic stress disorder) Active Problems:   Borderline personality disorder (HCC)   Cocaine use disorder, severe, dependence (HCC)   Cannabis use disorder, moderate, dependence ([place])   Psychoactive substance-induced psychosis ([place])   Stimulant use disorder  Long Term Goal(s): Improvement in symptoms so as ready for discharge Improvement in symptoms so as ready for discharge   Short Term Goals: Ability to identify changes in lifestyle to reduce recurrence of condition will improve Ability to verbalize feelings will improve Ability to disclose and discuss suicidal ideas Ability to demonstrate self-control will improve Ability to identify and develop effective coping behaviors will improve Compliance with prescribed medications will improve Ability to identify triggers associated with substance abuse/mental health issues will improve Ability to identify changes in lifestyle to reduce recurrence of condition will improve Ability to verbalize feelings will improve Ability to disclose and discuss suicidal ideas Ability to demonstrate self-control will improve Ability to identify and develop effective coping behaviors will improve Compliance with prescribed medications will improve Ability to identify triggers associated with substance abuse/mental health issues will improve     Medication Management: Evaluate patient's response, side effects, and tolerance of medication regimen.  Therapeutic Interventions: 1 to 1 sessions, Unit Group sessions and Medication administration.  Evaluation of Outcomes: Not Met   RN Treatment Plan for Primary Diagnosis: PTSD (post-traumatic stress disorder) Long Term Goal(s): Knowledge of disease and therapeutic regimen to maintain health will improve  Short Term Goals: Ability to remain free from injury will improve, Ability to verbalize frustration  and anger appropriately will improve, Ability to demonstrate self-control, Ability to verbalize feelings will improve, Ability to disclose and discuss suicidal ideas, Ability to identify and develop effective coping behaviors will improve and Compliance with prescribed medications will improve  Medication Management: RN will administer medications as ordered by provider, will assess and evaluate patient's response and provide education to patient for prescribed medication. RN will report any adverse and/or side effects to prescribing provider.  Therapeutic Interventions: 1 on 1 counseling sessions, Psychoeducation, Medication administration, Evaluate responses to treatment, Monitor vital signs and CBGs as ordered, Perform/monitor CIWA, COWS, AIMS and Fall Risk screenings as ordered, Perform wound care treatments as ordered.  Evaluation of Outcomes: Not Met   LCSW Treatment Plan for Primary Diagnosis: PTSD (post-traumatic stress disorder) Long Term Goal(s): Safe transition to appropriate next level of care at discharge, Engage patient in therapeutic group addressing interpersonal concerns.  Short Term Goals: Engage patient in aftercare planning with referrals and resources, Increase social support, Increase emotional regulation, Facilitate patient progression through stages of change regarding substance use diagnoses and concerns, Identify triggers associated with mental health/substance abuse issues and Increase skills for wellness and recovery  Therapeutic Interventions: Assess for all discharge needs, 1 to 1 time with Social worker, Explore available resources and support systems, Assess for adequacy in community support network, Educate family and significant other(s) on suicide prevention, Complete Psychosocial Assessment, Interpersonal group therapy.  Evaluation of Outcomes: Not Met   Progress in Treatment: Attending groups: Yes. Participating in groups: Yes. Taking medication as  prescribed: Yes. Toleration medication: Yes. Family/Significant other contact made: No, will contact:  when given permission. Patient understands diagnosis: Yes. Discussing patient identified problems/goals with staff: Yes. Medical problems stabilized or resolved: Yes. Denies suicidal/homicidal ideation: Yes. Issues/concerns per patient self-inventory: No. Other: None.  New problem(s) identified: No, Describe:  none.  New Short Term/Long Term Goal(s): detox, elimination of symptoms of psychosis, medication management for mood stabilization; elimination of SI thoughts; development of comprehensive mental wellness/sobriety plan.  Patient Goals: "Housing and long-term treatment."  Discharge Plan or Barriers: Pt expressed interest in long-term treatment upon discharge.  Reason for Continuation of Hospitalization: Delusions  Medication stabilization Suicidal ideation  Estimated Length of Stay: 1-7 days  Attendees: Patient: Bridget Carpenter 02/01/2021 9:44 AM  Physician: Selina Cooley, MD 02/01/2021 9:44 AM  Nursing: West Pugh, RN 02/01/2021 9:44 AM  RN Care Manager: 02/01/2021 9:44 AM  Social Worker: Assunta Curtis, MSW, LCSW 02/01/2021 9:44 AM  Recreational Therapist: Roanna Epley, Reather Converse, LRT  02/01/2021 9:44 AM  Other: Chalmers Guest. Guerry Bruin, MSW, [place], [place] 02/01/2021 9:44 AM  Other:  02/01/2021 9:44 AM  Other: 02/01/2021 9:44 AM    Scribe for Treatment Team: Shirl Harris, LCSW 02/01/2021 9:44 AM

## 2021-02-01 NOTE — BHH Group Notes (Signed)
LCSW Group Therapy Note  02/01/2021 1:59 PM  Type of Therapy/Topic:  Group Therapy:  Balance in Life  Participation Level:  Did Not Attend  Description of Group:    This group will address the concept of balance and how it feels and looks when one is unbalanced. Patients will be encouraged to process areas in their lives that are out of balance and identify reasons for remaining unbalanced. Facilitators will guide patients in utilizing problem-solving interventions to address and correct the stressor making their life unbalanced. Understanding and applying boundaries will be explored and addressed for obtaining and maintaining a balanced life. Patients will be encouraged to explore ways to assertively make their unbalanced needs known to significant others in their lives, using other group members and facilitator for support and feedback.  Therapeutic Goals: 1. Patient will identify two or more emotions or situations they have that consume much of in their lives. 2. Patient will identify signs/triggers that life has become out of balance:  3. Patient will identify two ways to set boundaries in order to achieve balance in their lives:  4. Patient will demonstrate ability to communicate their needs through discussion and/or role plays  Summary of Patient Progress: X  Therapeutic Modalities:   Cognitive Behavioral Therapy Solution-Focused Therapy Assertiveness Training  Simona Huh R. Algis Greenhouse, MSW, LCSW, LCAS 02/01/2021 1:59 PM

## 2021-02-01 NOTE — Progress Notes (Signed)
Patient alert and oriented x 4, affect is blunted thoughts are disorganized, speech is tangential she appears hyper verbal and she is speaks in a foreign accent. Patient denies SI/HI/AVH, she appears responding to internal stimuli, interacting appropriately with peers but guarded with staff, she was offered emotional support and encouragement, she was compliant with medication regimen, 15 minutes safety checks maintained will continue to monitor.

## 2021-02-01 NOTE — Progress Notes (Signed)
Recreation Therapy Notes  Date: 02/01/2021  Time: 9:30 am   Location: Craft room   Behavioral response: Appropriate  Intervention Topic: Teamwork   Discussion/Intervention:  Group content on today was focused on teamwork. The group identified what teamwork is. Individuals described who is a part of their team. Patients expressed why they thought teamwork is important. The group stated reasons why they thought it was easier to work with a Comptroller team. Individuals discussed some positives and negatives of working with a team. Patients gave examples of past experiences they had while working with a team. The group participated in the intervention "Story in a bag", patients were in groups and were able to test their skill in a team setting.  Clinical Observations/Feedback: Patient came to group and identified hard working, honest, focus, loyal and encouraging as characteristics she looks for in a teammate. Individual was social with peers and staff while participating in the intervention.  Bridget Carpenter LRT/CTRS           Ilya Neely 02/01/2021 11:45 AM

## 2021-02-01 NOTE — Progress Notes (Signed)
Initial Nutrition Assessment  DOCUMENTATION CODES:   Not applicable  INTERVENTION:   Ensure Enlive po BID, each supplement provides 350 kcal and 20 grams of protein  MVI po daily   NUTRITION DIAGNOSIS:   Predicted suboptimal nutrient intake related to social / environmental circumstances (substance abuse) as evidenced by per patient/family report.  GOAL:   Patient will meet greater than or equal to 90% of their needs  MONITOR:   PO intake,Supplement acceptance  REASON FOR ASSESSMENT:   Malnutrition Screening Tool    ASSESSMENT:   30 y.o. female with past medical history of schizoaffective disorder, PTSD, polysubstance abuse and asthma who presents to the ED for psychiatric evaluation.   Per chart review, suspect pt with decreased appetite and oral intake at baseline r/t substance abuse. Pt does not have any documented intakes from this admit. Per chart, pt appears weight stable pta. RD will add supplements to help pt meet her estimated needs. Recommend continue MVI daily.   Medications reviewed and include: melatonin, MVI  Labs reviewed:   Diet Order:   Diet Order            Diet regular Room service appropriate? Yes; Fluid consistency: Thin  Diet effective now                EDUCATION NEEDS:   No education needs have been identified at this time  Skin:  Skin Assessment: Reviewed RN Assessment  Last BM:  pta  Height:   Ht Readings from Last 1 Encounters:  01/31/21 5' 6.54" (1.69 m)    Weight:   Wt Readings from Last 1 Encounters:  01/31/21 57.2 kg    Ideal Body Weight:  59 kg  BMI:  Body mass index is 20.01 kg/m.  Estimated Nutritional Needs:   Kcal:  1700-2000kcal/day  Protein:  85-100g/day  Fluid:  1.7-2.0L/day  Betsey Holiday MS, RD, LDN Please refer to [facility] for RD and/or RD on-call/weekend/after hours pager

## 2021-02-01 NOTE — Progress Notes (Signed)
Recreation Therapy Notes  INPATIENT RECREATION THERAPY ASSESSMENT  Patient Details Name: [name] Shayan Mathena MRN: [id number] DOB: 03-13-1991 Today's Date: 02/01/2021       Information Obtained From: Patient  Able to Participate in Assessment/Interview: Yes  Patient Presentation: Responsive  Reason for Admission (Per Patient): Active Symptoms,Suicidal Ideation,Suicide Attempt  Patient Stressors:    Coping Skills:   Other (Comment),Talk (Cry)  Leisure Interests (2+):  Music - Listen,Music - Singing,Sports - Dance  Frequency of Recreation/Participation: Monthly  Awareness of Community Resources:     [employer]:     Current Use:    If no, Barriers?:    Expressed Interest in [employer] Information:    [employer]

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
## 2021-02-01 NOTE — Progress Notes (Signed)
[facility] MD Progress Note  02/01/2021 12:34 PM [name]  MRN:  [id number]  CC: "I need an MRI"  Subjective:  Patient is a 30 year old female with PTSD, stimulant use disorder, substance induced psychosis, and substance induced mood disorder presenting with suicidal and homicidal ideations. She had no acute overnight events. She is selectively compliant with Lexapro and Melatonin.  Patient seen during treatment team and again one-on-one. She states her goal is to go to long-term substance abuse treatment and then get housing. She requests that I discontinue Abilify as it is poisoning her body. She states she will only take escitalopram and melatonin, and requests that I triple the dose of both. She is unreceptive to explanation of her being on both Abilify oral and LAI in the past and find a benefit. She continues to refuse this medication. She is also not entirely receptive to explanations that increased dose of Lexapro and Melatonin are not indicated, but eventually agrees to continue these current dose prescribed. She also requests a brain MRI. When told this is not clinically indicated she asks for a CT scan or Xray. Informed these were not indicated either. Finally, she requests a multivitamin with folic acid. I did agree to start this for her. She then proceeded to ask if this would absorb the Lexapro and make her ugly. I did commend her on her asking about drug-drug interactions, and did encourage her to continue asking questions to both her provider and pharmacist in the future. Reassurance provided that these do not interact, and it will not make her ugly. After the above discussion, when asked how she was feeling. She smiles brightly and states, "I'm suicidal." Today, she does not elaborate on plan or intent. On entire encounter patient maintains a fairly inappropriate smile for the context and intermittently laughs at odd intervals. She is also observed responding to internal stimuli and  speaking to herself when she is on her own on the unit.   Principal Problem: PTSD (post-traumatic stress disorder) Diagnosis: Principal Problem:   PTSD (post-traumatic stress disorder) Active Problems:   Borderline personality disorder (HCC)   Cocaine use disorder, severe, dependence (HCC)   Cannabis use disorder, moderate, dependence (HCC)   Psychoactive substance-induced psychosis (HCC)   Stimulant use disorder   Insomnia due to mental disorder  Total Time spent with patient: 45 minutes  Past Psychiatric History: See H&P  Past Medical History:  Past Medical History:  Diagnosis Date  . Asthma   . Depression 2009   Inpatient psych admission for SI, dissociative fugue  . Dissociative disorder or reaction 2009  . Eczema   . H/O: suicide attempt   . ODD (oppositional defiant disorder)   . PTSD (post-traumatic stress disorder)   . Schizoaffective disorder (HCC)   . Substance abuse [facility])     Past Surgical History:  Procedure Laterality Date  . ADENOIDECTOMY    . TONSILLECTOMY     Family History:  Family History  Adopted: Yes  Problem Relation Age of Onset  . Mental illness Mother   . Mental illness Father   . Heart disease Maternal Grandmother   . Sickle cell trait Adoptive Mother   . Cleft palate Cousin        maternal side   Family Psychiatric  History: See H&P Social History:  Social History   Substance and Sexual Activity  Alcohol Use Yes  . Alcohol/week: 8.0 standard drinks  . Types: 8 Cans of beer per week   Comment: 1 case  of beer/day     Social History   Substance and Sexual Activity  Drug Use Yes  . Types: Marijuana, "Crack" cocaine, Heroin, Methylphenidate    Social History   Socioeconomic History  . Marital status: Single    Spouse name: Not on file  . Number of children: 1  . Years of education: Not on file  . Highest education level: Not on file  Occupational History  . Not on file  Tobacco Use  . Smoking status: Former Smoker     Packs/day: 0.75    Years: 2.00    Pack years: 1.50    Types: Cigarettes  . Smokeless tobacco: Never Used  . Tobacco comment: pt states she is quitting today  Vaping Use  . Vaping Use: Never used  Substance and Sexual Activity  . Alcohol use: Yes    Alcohol/week: 8.0 standard drinks    Types: 8 Cans of beer per week    Comment: 1 case of beer/day  . Drug use: Yes    Types: Marijuana, "Crack" cocaine, Heroin, Methylphenidate  . Sexual activity: Not Currently    Partners: Male  Other Topics Concern  . Not on file  Social History Narrative   Is homeless   Her mother is her daughter's gaurdian   Multiple psychiatric hospitalizations, not on any medication currently   Substance use disorder   Social Determinants of Health   Financial Resource Strain: Not on file  Food Insecurity: Not on file  Transportation Needs: Not on file  Physical Activity: Not on file  Stress: Not on file  Social Connections: Not on file   Additional Social History:                         Sleep: Poor  Appetite:  Fair  Current Medications: Current Facility-Administered Medications  Medication Dose Route Frequency Provider Last Rate Last Admin  . alum & mag hydroxide-simeth (MAALOX/MYLANTA) 200-200-20 MG/5ML suspension 30 mL  30 mL Oral Q4H PRN Clapacs, John T, MD      . ARIPiprazole (ABILIFY) tablet 10 mg  10 mg Oral BID Clapacs, John T, MD      . escitalopram (LEXAPRO) tablet 30 mg  30 mg Oral Daily [name], MD   30 mg at 02/01/21 0804  . hydrocerin (EUCERIN) cream   Topical BID PRN [name], MD      . hydrOXYzine (ATARAX/VISTARIL) tablet 50 mg  50 mg Oral TID PRN Clapacs, [name], MD      . ibuprofen (ADVIL) tablet 400 mg  400 mg Oral Q6H PRN [name], MD   400 mg at 01/31/21 1810  . influenza vac split quadrivalent PF (FLUARIX) injection 0.5 mL  0.5 mL Intramuscular Tomorrow-1000 [name], MD      . magnesium hydroxide (MILK OF MAGNESIA) suspension 30 mL   30 mL Oral Daily PRN Clapacs, John T, MD      . melatonin tablet 10 mg  10 mg Oral QHS [name], MD      . multivitamin with minerals tablet 1 tablet  1 tablet Oral Daily [name], MD      . traZODone (DESYREL) tablet 100 mg  100 mg Oral QHS PRN Clapacs, John T, MD      . triamcinolone (KENALOG) 0.025 % cream   Topical BID [name], MD   1 application at 02/01/21 7654    Lab Results:  Results for  orders placed or performed during the hospital encounter of 01/30/21 (from the past 48 hour(s))  SARS Coronavirus 2 by RT PCR (hospital order, performed in [facility] hospital lab) Nasopharyngeal Nasopharyngeal Swab     Status: None   Collection Time: 01/30/21  2:43 PM   Specimen: Nasopharyngeal Swab  Result Value Ref Range   SARS Coronavirus 2 NEGATIVE NEGATIVE    Comment: (NOTE) SARS-CoV-2 target nucleic acids are NOT DETECTED.  The SARS-CoV-2 RNA is generally detectable in upper and lower respiratory specimens during the acute phase of infection. The lowest concentration of SARS-CoV-2 viral copies this assay can detect is 250 copies / mL. A negative result does not preclude SARS-CoV-2 infection and should not be used as the sole basis for treatment or other patient management decisions.  A negative result may occur with improper specimen collection / handling, submission of specimen other than nasopharyngeal swab, presence of viral mutation(s) within the areas targeted by this assay, and inadequate number of viral copies (<250 copies / mL). A negative result must be combined with clinical observations, patient history, and epidemiological information.  Fact Sheet for Patients:   BoilerBrush.com.cy  Fact Sheet for Healthcare Providers: https://pope.com/  This test is not yet approved or  cleared by the [place] FDA and has been authorized for detection and/or diagnosis of SARS-CoV-2 by FDA under an Emergency  Use Authorization (EUA).  This EUA will remain in effect (meaning this test can be used) for the duration of the COVID-19 declaration under Section 564(b)(1) of the Act, 21 U.S.C. section 360bbb-3(b)(1), unless the authorization is terminated or revoked sooner.  Performed at [facility], [address] [place], [place] [address]     Blood Alcohol level:  Lab Results  Component Value Date   ETH <10 01/30/2021   ETH 18 (H) 11/11/2020    Metabolic Disorder Labs: Lab Results  Component Value Date   HGBA1C 5.1 01/30/2021   MPG 99.67 01/30/2021   MPG 96.8 12/26/2017   Lab Results  Component Value Date   PROLACTIN 21.5 11/06/2016   PROLACTIN 49.4 (H) 08/13/2016   Lab Results  Component Value Date   CHOL 128 01/30/2021   TRIG 35 01/30/2021   HDL 99 01/30/2021   CHOLHDL 1.3 01/30/2021   VLDL 7 01/30/2021   LDLCALC 22 01/30/2021   LDLCALC 42 12/06/2018    Physical Findings: AIMS:  , ,  ,  ,    CIWA:    COWS:     Musculoskeletal: Strength & Muscle Tone: within normal limits Gait & Station: normal Patient leans: N/A  Psychiatric Specialty Exam: Physical Exam Vitals and nursing note reviewed.  Constitutional:      Appearance: Normal appearance.  HENT:     Head: Normocephalic and atraumatic.     Right Ear: External ear normal.     Left Ear: External ear normal.     Nose: Nose normal.     Mouth/Throat:     Mouth: Mucous membranes are moist.     Pharynx: Oropharynx is clear.  Eyes:     Extraocular Movements: Extraocular movements intact.     Conjunctiva/sclera: Conjunctivae normal.     Pupils: Pupils are equal, round, and reactive to light.  Cardiovascular:     Rate and Rhythm: Normal rate.     Pulses: Normal pulses.  Pulmonary:     Effort: Pulmonary effort is normal.     Breath sounds: Normal breath sounds.  Abdominal:     General: Abdomen is flat.  Palpations: Abdomen is soft.  Musculoskeletal:        General: No swelling. Normal range of  motion.     Cervical back: Normal range of motion and neck supple.  Skin:    General: Skin is warm and dry.  Neurological:     General: No focal deficit present.     Mental Status: She is alert and oriented to person, place, and time.  Psychiatric:        Attention and Perception: She is inattentive. She perceives auditory hallucinations.        Mood and Affect: Mood is anxious. Affect is labile.        Speech: Speech is rapid and pressured.        Behavior: Behavior is hyperactive.        Thought Content: Thought content includes suicidal ideation. Thought content does not include homicidal ideation. Thought content does not include suicidal plan.     Review of Systems  Constitutional: Negative for appetite change and fatigue.  HENT: Negative for rhinorrhea and sore throat.   Eyes: Negative for photophobia and visual disturbance.  Respiratory: Negative for cough and shortness of breath.   Cardiovascular: Negative for chest pain and palpitations.  Gastrointestinal: Negative for constipation, diarrhea, nausea and vomiting.  Endocrine: Negative for cold intolerance and heat intolerance.  Genitourinary: Negative for difficulty urinating and dysuria.  Musculoskeletal: Negative for arthralgias and myalgias.  Skin: Negative for rash and wound.  Allergic/Immunologic: Positive for environmental allergies and food allergies.  Neurological: Negative for dizziness and headaches.  Hematological: Negative for adenopathy. Does not bruise/bleed easily.  Psychiatric/Behavioral: Positive for decreased concentration, hallucinations and suicidal ideas. The patient is nervous/anxious and is hyperactive.     Blood pressure 108/66, pulse 66, temperature 98.5 F (36.9 C), temperature source Oral, resp. rate 16, height 5' 6.54" (1.69 m), weight 57.2 kg, SpO2 (!) 78 %, unknown if currently breastfeeding.Body mass index is 20.01 kg/m.  General Appearance: Disheveled  Eye Contact:  Fair  Speech:  Speaking  in fake accent, pressured  Volume:  Normal  Mood:  "I'm suicidal"  Affect:  Non-Congruent and Inappropriate, smiling, euphoric appearing  Thought Process:  Disorganized  Orientation:  Full (Time, Place, and Person)  Thought Content:  Paranoid Ideation, Rumination and patient denies auditory and visual hallucinations, but is observed responding to internal stimuli  Suicidal Thoughts:  Yes.  without intent/plan  Homicidal Thoughts:  No  Memory:  Immediate;   Fair Recent;   Fair  Judgement:  Impaired  Insight:  Shallow  Psychomotor Activity:  Restlessness  Concentration:  Concentration: Poor  Recall:  [employer]  Fund of Knowledge:  Fair  Language:  Negative  Akathisia:  Negative  Handed:  Right  AIMS (if indicated):     Assets:  Desire for Improvement Financial Resources/Insurance Physical Health Resilience  ADL's:  Impaired  Cognition:  Impaired,  Mild  Sleep:  Number of Hours: 7.5     Treatment Plan Summary: Daily contact with patient to assess and evaluate symptoms and progress in treatment and Medication management   1)PTSD, chronic- stable - Continue Lexapro 30 mg daily  2) Substance induced mood disorder- unstable - Patient continues to endorse suicidal ideations. Continue Lexapro 30 daily, Abilify 10 mg BID - Patient requesting long-term substance abuse treatment program. Will continue to search for placement with social work team  3) Substance induced psychosis- unstable - Continue Abilify 10 mg BID. 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

## 2021-02-02 DIAGNOSIS — F4310 Post-traumatic stress disorder, unspecified: Secondary | ICD-10-CM | POA: Diagnosis not present

## 2021-02-02 NOTE — Progress Notes (Signed)
[facility] MD Progress Note  02/02/2021 11:39 AM [name]  MRN:  [id number]  Principal Problem: PTSD (post-traumatic stress disorder) Diagnosis: Principal Problem:   PTSD (post-traumatic stress disorder) Active Problems:   Borderline personality disorder (HCC)   Cocaine use disorder, severe, dependence (HCC)   Cannabis use disorder, moderate, dependence (HCC)   Psychoactive substance-induced psychosis (HCC)   Stimulant use disorder   Insomnia due to mental disorder  Bridget Carpenter is a  29y.o. female with PTSD, stimulant use disorder, substance induced psychosis, and substance induced mood disorder presenting with suicidal and homicidal ideations.   Interval History Patient was seen today for re-evaluation.  Nursing reports no events overnight. The patient has no issues with performing ADLs.  Patient has been medication partially compliant.    Subjective:  On assessment patient reports "I am okay". Denies any complaints. She appears irritable and explains that she does not want [place] to keep offering her Abilify "I just don`t take Abilify; I am good with Escitalopram and Melatonin." She appears guarded, not open to the conversation with unfamiliar doctor. She denies thoughts of harming self and others at the beginning of the interview, then confirmed that she is not suicidal at the end. She denies any hallucinations as well.  The patient reports no side effects from medications.    Labs: no new results for review.  Total Time spent with patient: 15 minutes  Past Psychiatric History: see H&P  Past Medical History:  Past Medical History:  Diagnosis Date  . Asthma   . Depression 2009   Inpatient psych admission for SI, dissociative fugue  . Dissociative disorder or reaction 2009  . Eczema   . H/O: suicide attempt   . ODD (oppositional defiant disorder)   . PTSD (post-traumatic stress disorder)   . Schizoaffective disorder (HCC)   . Substance abuse [facility])     Past Surgical History:   Procedure Laterality Date  . ADENOIDECTOMY    . TONSILLECTOMY     Family History:  Family History  Adopted: Yes  Problem Relation Age of Onset  . Mental illness Mother   . Mental illness Father   . Heart disease Maternal Grandmother   . Sickle cell trait Adoptive Mother   . Cleft palate Cousin        maternal side   Family Psychiatric  History: see H&P Social History:  Social History   Substance and Sexual Activity  Alcohol Use Yes  . Alcohol/week: 8.0 standard drinks  . Types: 8 Cans of beer per week   Comment: 1 case of beer/day     Social History   Substance and Sexual Activity  Drug Use Yes  . Types: Marijuana, "Crack" cocaine, Heroin, Methylphenidate    Social History   Socioeconomic History  . Marital status: Single    Spouse name: Not on file  . Number of children: 1  . Years of education: Not on file  . Highest education level: Not on file  Occupational History  . Not on file  Tobacco Use  . Smoking status: Former Smoker    Packs/day: 0.75    Years: 2.00    Pack years: 1.50    Types: Cigarettes  . Smokeless tobacco: Never Used  . Tobacco comment: pt states she is quitting today  Vaping Use  . Vaping Use: Never used  Substance and Sexual Activity  . Alcohol use: Yes    Alcohol/week: 8.0 standard drinks    Types: 8 Cans of beer per week  Comment: 1 case of beer/day  . Drug use: Yes    Types: Marijuana, "Crack" cocaine, Heroin, Methylphenidate  . Sexual activity: Not Currently    Partners: Male  Other Topics Concern  . Not on file  Social History Narrative   Is homeless   Her mother is her daughter's gaurdian   Multiple psychiatric hospitalizations, not on any medication currently   Substance use disorder   Social Determinants of Health   Financial Resource Strain: Not on file  Food Insecurity: Not on file  Transportation Needs: Not on file  Physical Activity: Not on file  Stress: Not on file  Social Connections: Not on file    Additional Social History:                         Sleep: Fair  Appetite:  Fair  Current Medications: Current Facility-Administered Medications  Medication Dose Route Frequency Provider Last Rate Last Admin  . alum & mag hydroxide-simeth (MAALOX/MYLANTA) 200-200-20 MG/5ML suspension 30 mL  30 mL Oral Q4H PRN Clapacs, John T, MD      . ARIPiprazole (ABILIFY) tablet 10 mg  10 mg Oral BID Clapacs, John T, MD      . escitalopram (LEXAPRO) tablet 30 mg  30 mg Oral Daily [name], MD   30 mg at 02/02/21 2080  . feeding supplement (ENSURE ENLIVE / ENSURE PLUS) liquid 237 mL  237 mL Oral BID BM [name], MD   237 mL at 02/01/21 1447  . hydrocerin (EUCERIN) cream   Topical BID PRN [name], MD      . hydrOXYzine (ATARAX/VISTARIL) tablet 50 mg  50 mg Oral TID PRN Clapacs, [name], MD      . ibuprofen (ADVIL) tablet 400 mg  400 mg Oral Q6H PRN [name], MD   400 mg at 02/02/21 2233  . influenza vac split quadrivalent PF (FLUARIX) injection 0.5 mL  0.5 mL Intramuscular Tomorrow-1000 [name], MD      . magnesium hydroxide (MILK OF MAGNESIA) suspension 30 mL  30 mL Oral Daily PRN Clapacs, John T, MD      . melatonin tablet 10 mg  10 mg Oral QHS [name], MD   10 mg at 02/01/21 2118  . multivitamin with minerals tablet 1 tablet  1 tablet Oral Daily [name], MD   1 tablet at 02/02/21 [phone]  . traZODone (DESYREL) tablet 100 mg  100 mg Oral QHS PRN Clapacs, [name], MD   100 mg at 02/01/21 2118  . triamcinolone (KENALOG) 0.025 % cream   Topical BID [name], MD   1 application at 02/01/21 1755    Lab Results: No results found for this or any previous visit (from the past 48 hour(s)).  Blood Alcohol level:  Lab Results  Component Value Date   ETH <10 01/30/2021   ETH 18 (H) 11/11/2020    Metabolic Disorder Labs: Lab Results  Component Value Date   HGBA1C 5.1 01/30/2021   MPG 99.67 01/30/2021   MPG 96.8 12/26/2017   Lab  Results  Component Value Date   PROLACTIN 21.5 11/06/2016   PROLACTIN 49.4 (H) 08/13/2016   Lab Results  Component Value Date   CHOL 128 01/30/2021   TRIG 35 01/30/2021   HDL 99 01/30/2021   CHOLHDL 1.3 01/30/2021   VLDL 7 01/30/2021   LDLCALC 22 01/30/2021   LDLCALC 42 12/06/2018  Physical Findings: AIMS:  , ,  ,  ,    CIWA:    COWS:     Musculoskeletal: Strength & Muscle Tone: within normal limits Gait & Station: normal Patient leans: N/A  Psychiatric Specialty Exam: Physical Exam  Review of Systems  Blood pressure 102/62, pulse 78, temperature 98.2 F (36.8 C), temperature source Oral, resp. rate 17, height 5' 6.54" (1.69 m), weight 57.2 kg, SpO2 (!) 78 %, unknown if currently breastfeeding.Body mass index is 20.01 kg/m.  General Appearance: Disheveled  Eye Contact:  Fair  Speech:  Clear and Coherent  Volume:  Normal  Mood:  Angry, Irritable and "I am okay"  Affect:  Restricted and guarded  Thought Process:  Coherent and Disorganized  Orientation:  Full (Time, Place, and Person)  Thought Content:  Delusions, Paranoid Ideation and Rumination  Suicidal Thoughts:  No  Homicidal Thoughts:  No  Memory:  Immediate;   Fair Recent;   Fair  Judgement:  Impaired  Insight:  Shallow  Psychomotor Activity:  Normal  Concentration:  Concentration: Fair and Attention Span: Fair  Recall:  [employer] of Knowledge:  Fair  Language:  Fair  Akathisia:  No  Handed:  Right  AIMS (if indicated):     Assets:  Financial Resources/Insurance Physical Health Resilience  ADL's:  Impaired  Cognition:  Impaired,  Mild  Sleep:  Number of Hours: 7.5     Treatment Plan Summary: Daily contact with patient to assess and evaluate symptoms and progress in treatment and Medication management   Patient is a61 year old female with the above-stated past psychiatric history who is seen in follow-up.  Chart reviewed. Patient discussed with nursing. Patient continues to refuse one of  prescribed medications (Abilify), she is still delusional and paranoid about medications and is agreeable to Lexapro and Melatonin only. She is not suicidal today. She was not observed talking to herself either so far today.   Plan: -continue inpatient psych admission; 15-minute checks; daily contact with patient to assess and evaluate symptoms and progress in treatment; psychoeducation.  1)PTSD, chronic- stable - Continue Lexapro 30 mg daily  2) Substance induced mood disorder- unstable - Patient denies suicidal ideas today. Continue Lexapro 30 daily, Abilify 10 mg BID - Patient requesting long-term substance abuse treatment program. Will continue to search for placement with social work team  3) Substance induced psychosis- unstable, still paranoid. - Continue Abilify 10 mg BID. 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   Thalia Party, MD 02/02/2021, 11:39 AM

## 2021-02-02 NOTE — Progress Notes (Signed)
Patient refused her prescribed Abilify stating that "it is poison". She also stated that she would wait to use her Kenalog cream after her shower. MD will be notified.

## 2021-02-02 NOTE — Plan of Care (Signed)
  Problem: Education: Goal: Knowledge of [facility] Education information/materials will improve Outcome: Progressing Goal: Emotional status will improve Outcome: Progressing Goal: Mental status will improve Outcome: Progressing Goal: Verbalization of understanding the information provided will improve Outcome: Progressing   Problem: Activity: Goal: Interest or engagement in activities will improve Outcome: Progressing Goal: Sleeping patterns will improve Outcome: Progressing   Problem: Coping: Goal: Ability to verbalize frustrations and anger appropriately will improve Outcome: Progressing Goal: Ability to demonstrate self-control will improve Outcome: Progressing   Problem: Health Behavior/Discharge Planning: Goal: Identification of resources available to assist in meeting health care needs will improve Outcome: Progressing Goal: Compliance with treatment plan for underlying cause of condition will improve Outcome: Progressing   Problem: Physical Regulation: Goal: Ability to maintain clinical measurements within normal limits will improve Outcome: Progressing   Problem: Safety: Goal: Periods of time without injury will increase Outcome: Progressing   Problem: Education: Goal: Ability to make informed decisions regarding treatment will improve Outcome: Progressing   Problem: Coping: Goal: Coping ability will improve Outcome: Progressing   Problem: Health Behavior/Discharge Planning: Goal: Identification of resources available to assist in meeting health care needs will improve Outcome: Progressing   Problem: Medication: Goal: Compliance with prescribed medication regimen will improve Outcome: Progressing   Problem: Self-Concept: Goal: Ability to disclose and discuss suicidal ideas will improve Outcome: Progressing Goal: Will verbalize positive feelings about self Outcome: Progressing   Problem: Activity: Goal: Will verbalize the importance of  balancing activity with adequate rest periods Outcome: Progressing   Problem: Education: Goal: Will be free of psychotic symptoms Outcome: Progressing Goal: Knowledge of the prescribed therapeutic regimen will improve Outcome: Progressing   Problem: Coping: Goal: Coping ability will improve Outcome: Progressing Goal: Will verbalize feelings Outcome: Progressing   Problem: Health Behavior/Discharge Planning: Goal: Compliance with prescribed medication regimen will improve Outcome: Progressing   Problem: Nutritional: Goal: Ability to achieve adequate nutritional intake will improve Outcome: Progressing   Problem: Role Relationship: Goal: Ability to communicate needs accurately will improve Outcome: Progressing Goal: Ability to interact with others will improve Outcome: Progressing   Problem: Safety: Goal: Ability to redirect hostility and anger into socially appropriate behaviors will improve Outcome: Progressing Goal: Ability to remain free from injury will improve Outcome: Progressing   Problem: Self-Care: Goal: Ability to participate in self-care as condition permits will improve Outcome: Progressing   Problem: Self-Concept: Goal: Will verbalize positive feelings about self Outcome: Progressing   

## 2021-02-02 NOTE — Plan of Care (Signed)
D- Patient alert and oriented. Patient presented in an animated, but pleasant mood on assessment stating that she slept good last night and had complaints of a headache. Patient rated her pain a "10/10", in which she requested PRN pain medication from this Clinical research associate. Patient endorsed passive SI, without a plan, as well as both depression and anxiety. Patient stated that "everyday life" and "just being alive" is why she feels this way. Patient also endorsed AH, reporting that the voices "keep on and on, talking about stories". Patient denied HI/VH at this time. Patient had no stated goals for today.  A- Some scheduled medications administered to patient, per MD orders. Support and encouragement provided.  Routine safety checks conducted every 15 minutes.  Patient informed to notify staff with problems or concerns.  R- No adverse drug reactions noted. Patient contracts for safety at this time. Patient compliant with some medications and treatment plan. Patient receptive, calm, and cooperative. Patient interacts well with others on the unit.  Patient remains safe at this time.  Problem: Education: Goal: Knowledge of [facility] Education information/materials will improve Outcome: Not Progressing Goal: Emotional status will improve Outcome: Not Progressing Goal: Mental status will improve Outcome: Not Progressing Goal: Verbalization of understanding the information provided will improve Outcome: Not Progressing   Problem: Activity: Goal: Interest or engagement in activities will improve Outcome: Not Progressing Goal: Sleeping patterns will improve Outcome: Not Progressing   Problem: Coping: Goal: Ability to verbalize frustrations and anger appropriately will improve Outcome: Not Progressing Goal: Ability to demonstrate self-control will improve Outcome: Not Progressing   Problem: Health Behavior/Discharge Planning: Goal: Identification of resources available to assist in meeting health  care needs will improve Outcome: Not Progressing Goal: Compliance with treatment plan for underlying cause of condition will improve Outcome: Not Progressing   Problem: Physical Regulation: Goal: Ability to maintain clinical measurements within normal limits will improve Outcome: Not Progressing   Problem: Safety: Goal: Periods of time without injury will increase Outcome: Not Progressing   Problem: Education: Goal: Ability to make informed decisions regarding treatment will improve Outcome: Not Progressing   Problem: Coping: Goal: Coping ability will improve Outcome: Not Progressing   Problem: Health Behavior/Discharge Planning: Goal: Identification of resources available to assist in meeting health care needs will improve Outcome: Not Progressing   Problem: Medication: Goal: Compliance with prescribed medication regimen will improve Outcome: Not Progressing   Problem: Self-Concept: Goal: Ability to disclose and discuss suicidal ideas will improve Outcome: Not Progressing Goal: Will verbalize positive feelings about self Outcome: Not Progressing   Problem: Activity: Goal: Will verbalize the importance of balancing activity with adequate rest periods Outcome: Not Progressing   Problem: Education: Goal: Will be free of psychotic symptoms Outcome: Not Progressing Goal: Knowledge of the prescribed therapeutic regimen will improve Outcome: Not Progressing   Problem: Coping: Goal: Coping ability will improve Outcome: Not Progressing Goal: Will verbalize feelings Outcome: Not Progressing   Problem: Health Behavior/Discharge Planning: Goal: Compliance with prescribed medication regimen will improve Outcome: Not Progressing   Problem: Nutritional: Goal: Ability to achieve adequate nutritional intake will improve Outcome: Not Progressing   Problem: Role Relationship: Goal: Ability to communicate needs accurately will improve Outcome: Not Progressing Goal: Ability  to interact with others will improve Outcome: Not Progressing   Problem: Safety: Goal: Ability to redirect hostility and anger into socially appropriate behaviors will improve Outcome: Not Progressing Goal: Ability to remain free from injury will improve Outcome: Not Progressing   Problem: Self-Care: Goal: Ability  to participate in self-care as condition permits will improve Outcome: Not Progressing   Problem: Self-Concept: Goal: Will verbalize positive feelings about self Outcome: Not Progressing
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
## 2021-02-02 NOTE — Progress Notes (Signed)
Patient is child-like and needy. Intrusive at times. Initially refused Trazodone, then asked for it. Denies SI HI and AVH

## 2021-02-02 NOTE — Progress Notes (Signed)
Laboratory came down to get labs on two other patients, and patient was following the lab tech stating "I want my blood drawn". This Clinical research associate explained to patient that the doctor has to put in an order for this to be done.

## 2021-02-02 NOTE — [facility] Group Notes (Signed)
[facility] Group Notes: (Clinical Social Work)   02/02/2021      Type of Therapy:  Group Therapy   Participation Level:  Did Not Attend - was invited individually by Nurse/MHT and chose not to attend.   Susa Simmonds, LCSWA 02/02/2021  4:12 PM
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
## 2021-02-03 DIAGNOSIS — F4310 Post-traumatic stress disorder, unspecified: Secondary | ICD-10-CM | POA: Diagnosis not present

## 2021-02-03 MED ORDER — OLANZAPINE 5 MG PO TABS
5.0000 mg | ORAL_TABLET | Freq: Once | ORAL | Status: AC
  Administered 2021-02-03: 5 mg via ORAL
  Filled 2021-02-03: qty 1

## 2021-02-03 NOTE — [facility] Group Notes (Signed)
[facility] Group Notes: (Clinical Social Work)   02/03/2021      Type of Therapy:  Group Therapy   Participation Level:  Did Not Attend - was invited individually by Nurse/MHT and chose not to attend.   Susa Simmonds, LCSWA 02/03/2021  2:46 PM

## 2021-02-03 NOTE — Progress Notes (Signed)
[facility] MD Progress Note  02/03/2021 12:28 PM [name]  MRN:  [id number]  Principal Problem: PTSD (post-traumatic stress disorder) Diagnosis: Principal Problem:   PTSD (post-traumatic stress disorder) Active Problems:   Borderline personality disorder (HCC)   Cocaine use disorder, severe, dependence (HCC)   Cannabis use disorder, moderate, dependence (HCC)   Psychoactive substance-induced psychosis (HCC)   Stimulant use disorder   Insomnia due to mental disorder  Bridget Carpenter is a  30y.o. female with PTSD, stimulant use disorder, substance induced psychosis, and substance induced mood disorder presenting with suicidal and homicidal ideations.   Interval History Patient was seen today for re-evaluation.  Nursing reports patient had episodes of agitation and threatening behavior towards others last night, also reported she was having suicidal thoughts, was exposing her breasts to other patients and staff and has had to be redirected to keep herself covered. Patient has been medication partially compliant and still refuses to take Abilify.   Subjective:  On assessment patient reports "I want to to to rehab". Reports good mood, denies feeling depressed, suicidal or homicidal today. Reports "I hear voices" and says "maybe God wants me to hear these voices". She still refuses to take Abilify and expresses believe that it is poisonous for her eyes.  She is agreeable to continue Escitalopram and Melatonin. We discussed that those medications are prescribed for depression, anxiety and sleep, but will not help with her "voices". Patient reported that she would consider something else, but Abilify. She agreed to take Zyprexa one time and see how she feels on that and if she has any side effects. The patient reports no side effects from medications she is currently taking.    Labs: no new results for review.  Total Time spent with patient: 15 minutes  Past Psychiatric History: see H&P  Past  Medical History:  Past Medical History:  Diagnosis Date  . Asthma   . Depression 2009   Inpatient psych admission for SI, dissociative fugue  . Dissociative disorder or reaction 2009  . Eczema   . H/O: suicide attempt   . ODD (oppositional defiant disorder)   . PTSD (post-traumatic stress disorder)   . Schizoaffective disorder (HCC)   . Substance abuse [facility])     Past Surgical History:  Procedure Laterality Date  . ADENOIDECTOMY    . TONSILLECTOMY     Family History:  Family History  Adopted: Yes  Problem Relation Age of Onset  . Mental illness Mother   . Mental illness Father   . Heart disease Maternal Grandmother   . Sickle cell trait Adoptive Mother   . Cleft palate Cousin        maternal side   Family Psychiatric  History: see H&P Social History:  Social History   Substance and Sexual Activity  Alcohol Use Yes  . Alcohol/week: 8.0 standard drinks  . Types: 8 Cans of beer per week   Comment: 1 case of beer/day     Social History   Substance and Sexual Activity  Drug Use Yes  . Types: Marijuana, "Crack" cocaine, Heroin, Methylphenidate    Social History   Socioeconomic History  . Marital status: Single    Spouse name: Not on file  . Number of children: 1  . Years of education: Not on file  . Highest education level: Not on file  Occupational History  . Not on file  Tobacco Use  . Smoking status: Former Smoker    Packs/day: 0.75    Years: 2.00  Pack years: 1.50    Types: Cigarettes  . Smokeless tobacco: Never Used  . Tobacco comment: pt states she is quitting today  Vaping Use  . Vaping Use: Never used  Substance and Sexual Activity  . Alcohol use: Yes    Alcohol/week: 8.0 standard drinks    Types: 8 Cans of beer per week    Comment: 1 case of beer/day  . Drug use: Yes    Types: Marijuana, "Crack" cocaine, Heroin, Methylphenidate  . Sexual activity: Not Currently    Partners: Male  Other Topics Concern  . Not on file  Social History  Narrative   Is homeless   Her mother is her daughter's gaurdian   Multiple psychiatric hospitalizations, not on any medication currently   Substance use disorder   Social Determinants of Health   Financial Resource Strain: Not on file  Food Insecurity: Not on file  Transportation Needs: Not on file  Physical Activity: Not on file  Stress: Not on file  Social Connections: Not on file   Additional Social History:                         Sleep: Fair  Appetite:  Fair  Current Medications: Current Facility-Administered Medications  Medication Dose Route Frequency Provider Last Rate Last Admin  . alum & mag hydroxide-simeth (MAALOX/MYLANTA) 200-200-20 MG/5ML suspension 30 mL  30 mL Oral Q4H PRN Clapacs, John T, MD      . ARIPiprazole (ABILIFY) tablet 10 mg  10 mg Oral BID Clapacs, John T, MD      . escitalopram (LEXAPRO) tablet 30 mg  30 mg Oral Daily [name], MD   30 mg at 02/03/21 1040  . feeding supplement (ENSURE ENLIVE / ENSURE PLUS) liquid 237 mL  237 mL Oral BID BM [name], MD   237 mL at 02/03/21 1136  . hydrocerin (EUCERIN) cream   Topical BID PRN [name], MD      . hydrOXYzine (ATARAX/VISTARIL) tablet 50 mg  50 mg Oral TID PRN Clapacs, [name], MD   50 mg at 02/02/21 2105  . ibuprofen (ADVIL) tablet 400 mg  400 mg Oral Q6H PRN [name], MD   400 mg at 02/02/21 0109  . influenza vac split quadrivalent PF (FLUARIX) injection 0.5 mL  0.5 mL Intramuscular Tomorrow-1000 [name], MD      . magnesium hydroxide (MILK OF MAGNESIA) suspension 30 mL  30 mL Oral Daily PRN Clapacs, John T, MD      . melatonin tablet 10 mg  10 mg Oral QHS [name], MD   10 mg at 02/02/21 2105  . multivitamin with minerals tablet 1 tablet  1 tablet Oral Daily [name], MD   1 tablet at 02/02/21 [phone]  . traZODone (DESYREL) tablet 100 mg  100 mg Oral QHS PRN Clapacs, [name], MD   100 mg at 02/02/21 2105  . triamcinolone (KENALOG) 0.025 %  cream   Topical BID [name], MD   1 application at 02/01/21 1755    Lab Results: No results found for this or any previous visit (from the past 48 hour(s)).  Blood Alcohol level:  Lab Results  Component Value Date   ETH <10 01/30/2021   ETH 18 (H) 11/11/2020    Metabolic Disorder Labs: Lab Results  Component Value Date   HGBA1C 5.1 01/30/2021   MPG 99.67 01/30/2021   MPG 96.8  12/26/2017   Lab Results  Component Value Date   PROLACTIN 21.5 11/06/2016   PROLACTIN 49.4 (H) 08/13/2016   Lab Results  Component Value Date   CHOL 128 01/30/2021   TRIG 35 01/30/2021   HDL 99 01/30/2021   CHOLHDL 1.3 01/30/2021   VLDL 7 01/30/2021   LDLCALC 22 01/30/2021   LDLCALC 42 12/06/2018    Physical Findings: AIMS:  , ,  ,  ,    CIWA:    COWS:     Musculoskeletal: Strength & Muscle Tone: within normal limits Gait & Station: normal Patient leans: N/A  Psychiatric Specialty Exam: Physical Exam   Review of Systems   Blood pressure (!) 96/57, pulse 69, temperature 98.3 F (36.8 C), temperature source Oral, resp. rate 20, height 5' 6.54" (1.69 m), weight 57.2 kg, SpO2 (!) 79 %, unknown if currently breastfeeding.Body mass index is 20.01 kg/m.  General Appearance: Disheveled  Eye Contact:  Fair  Speech:  Clear and Coherent  Volume:  Normal  Mood:  euthymic  Affect:  Restricted and guarded  Thought Process:  Coherent and Disorganized  Orientation:  Full (Time, Place, and Person)  Thought Content:  Delusions, Paranoid Ideation and Rumination Auditory hallucinations  Suicidal Thoughts:  No  Homicidal Thoughts:  No  Memory:  Immediate;   Fair Recent;   Fair  Judgement:  Impaired  Insight:  Shallow  Psychomotor Activity:  Normal  Concentration:  Concentration: Fair and Attention Span: Fair  Recall:  [employer] of Knowledge:  Fair  Language:  Fair  Akathisia:  No  Handed:  Right  AIMS (if indicated):     Assets:  Financial Resources/Insurance Physical  Health Resilience  ADL's:  Impaired  Cognition:  Impaired,  Mild  Sleep:  Number of Hours: 7.25     Treatment Plan Summary: Daily contact with patient to assess and evaluate symptoms and progress in treatment and Medication management   Patient is a 30 year old female with the above-stated past psychiatric history who is seen in follow-up.  Chart reviewed. Patient discussed with nursing. Patient is still delusional, paranoid about medications and reported auditory hallucinations today. She is agreeable to Lexapro and Melatonin; she continues to refuse Abilify, although agreed to take one dose of Zyprexa.   Plan: -continue inpatient psych admission; 15-minute checks; daily contact with patient to assess and evaluate symptoms and progress in treatment; psychoeducation.  - Continue Lexapro 30 mg daily for PTSD, depression, anxiety.  - continue Abilify 10 mg BID for psychosis. Patient refuses. She agreed to take Zyprexa today; if still agreeable to continue tomorrow, maybe can be switched to this medication. Will leave this decision to the main treatment team.  -continue Melatonin 10 mg QHS, trazodone 100 mg QHS PRN insomnia.  -continue Hydroxyzine 25mg  PO TID PRN anxiety.  - Patient requesting long-term substance abuse treatment program for cocaine use disorder. Will continue to search for placement with social work team  -  I certify that the patient does need, on a daily basis, active treatment furnished directly by or requiring the supervision of inpatient psychiatric facility personnel.   , MD 02/03/2021, 12:28 PM

## 2021-02-03 NOTE — Plan of Care (Signed)
  Problem: Education: Goal: Knowledge of [facility] Education information/materials will improve Outcome: Not Progressing Goal: Emotional status will improve Outcome: Not Progressing Goal: Mental status will improve Outcome: Not Progressing Goal: Verbalization of understanding the information provided will improve Outcome: Not Progressing   Problem: Activity: Goal: Interest or engagement in activities will improve Outcome: Not Progressing Goal: Sleeping patterns will improve Outcome: Not Progressing   Problem: Coping: Goal: Ability to verbalize frustrations and anger appropriately will improve Outcome: Not Progressing Goal: Ability to demonstrate self-control will improve Outcome: Not Progressing   Problem: Health Behavior/Discharge Planning: Goal: Identification of resources available to assist in meeting health care needs will improve Outcome: Not Progressing Goal: Compliance with treatment plan for underlying cause of condition will improve Outcome: Not Progressing   Problem: Physical Regulation: Goal: Ability to maintain clinical measurements within normal limits will improve Outcome: Not Progressing   Problem: Safety: Goal: Periods of time without injury will increase Outcome: Not Progressing   Problem: Education: Goal: Ability to make informed decisions regarding treatment will improve Outcome: Not Progressing   Problem: Coping: Goal: Coping ability will improve Outcome: Not Progressing   Problem: Health Behavior/Discharge Planning: Goal: Identification of resources available to assist in meeting health care needs will improve Outcome: Not Progressing   Problem: Medication: Goal: Compliance with prescribed medication regimen will improve Outcome: Not Progressing   Problem: Self-Concept: Goal: Ability to disclose and discuss suicidal ideas will improve Outcome: Not Progressing Goal: Will verbalize positive feelings about self Outcome: Not  Progressing   Problem: Activity: Goal: Will verbalize the importance of balancing activity with adequate rest periods Outcome: Not Progressing   Problem: Education: Goal: Will be free of psychotic symptoms Outcome: Not Progressing Goal: Knowledge of the prescribed therapeutic regimen will improve Outcome: Not Progressing   Problem: Coping: Goal: Coping ability will improve Outcome: Not Progressing Goal: Will verbalize feelings Outcome: Not Progressing   Problem: Health Behavior/Discharge Planning: Goal: Compliance with prescribed medication regimen will improve Outcome: Not Progressing   Problem: Nutritional: Goal: Ability to achieve adequate nutritional intake will improve Outcome: Not Progressing   Problem: Role Relationship: Goal: Ability to communicate needs accurately will improve Outcome: Not Progressing Goal: Ability to interact with others will improve Outcome: Not Progressing   Problem: Safety: Goal: Ability to redirect hostility and anger into socially appropriate behaviors will improve Outcome: Not Progressing Goal: Ability to remain free from injury will improve Outcome: Not Progressing   Problem: Self-Care: Goal: Ability to participate in self-care as condition permits will improve Outcome: Not Progressing   Problem: Self-Concept: Goal: Will verbalize positive feelings about self Outcome: Not Progressing   

## 2021-02-03 NOTE — Progress Notes (Addendum)
Pt slept until 10:30am. Pt appears drowsy on assessment. She states, "I am only taking my escitalopram". Pt was encouraged to take her other medications, but pt refused. Pt also took a moment to look at her medications and said, "This does not look like escitalopram." This writer needed to show her the open package before she would take the medication. When asked if she wanted her Ensure, she whispers to herself, "I don't want that it has a lot of fat." However, she stated to this writer she would take it with lunch.  When asked about SI, pt states, "No. Actually yes." Pt does not state a plan. She also states that she hears voice. Pt denies HI.   Medications were given per MD orders. Support and encouragement was provided. Pt remains safe on the unit at this time and q15 minute safety checks are maintained.

## 2021-02-03 NOTE — Progress Notes (Signed)
Pt isolating in her room, coming out only for meals. Pt was offered her medications. She refused her Abilify and stated she would get the cream later, after showering.

## 2021-02-04 DIAGNOSIS — F4310 Post-traumatic stress disorder, unspecified: Secondary | ICD-10-CM | POA: Diagnosis not present

## 2021-02-04 MED ORDER — OLANZAPINE 5 MG PO TABS
5.0000 mg | ORAL_TABLET | Freq: Every day | ORAL | Status: DC
  Administered 2021-02-04 – 2021-02-06 (×3): 5 mg via ORAL
  Filled 2021-02-04 (×3): qty 1

## 2021-02-04 NOTE — Progress Notes (Addendum)
[facility] MD Progress Note  02/04/2021 2:23 PM Bridget Carpenter Bridget Carpenter  MRN:  [id number]   CC "I want to go to rehab" Subjective:  Bridget Carpenter is a  29y.o. female with PTSD, stimulant use disorder, substance induced psychosis, and substance induced mood disorder presenting with suicidal and homicidal ideations.No acute events overnight, attending to ADLs, continues to refuse Abilify. She was compliant with Lexapro, melatonin, and zyprexa.   Patient seen one-on-one today. She continues to desire long-term substance abuse treatment. She notes that she really needs something that will be longer than 2-3 weeks as she currently has severe cravings and access to money. She reports that she was drinking a 6-pack per day and multiple liquor shots. She also spends roughly $100 per day on cocaine, methamphetamine, mariajuana, and cigarettes. Today she endorses suicidal ideations, but does not have plan and contracts for safety in the hospital. She denies homicidal ideations. She continues to endorse auditory hallucinations, but will not elaborate on what they say. She has been seen responding to internal stimuli on the unit. She denies visual hallucinations. She denies side effects from Zyprexa overnight.   Principal Problem: PTSD (post-traumatic stress disorder) Diagnosis: Principal Problem:   PTSD (post-traumatic stress disorder) Active Problems:   Borderline personality disorder (HCC)   Cocaine use disorder, severe, dependence (HCC)   Cannabis use disorder, moderate, dependence (HCC)   Psychoactive substance-induced psychosis (HCC)   Stimulant use disorder   Insomnia due to mental disorder  Total Time spent with patient: 30 minutes  Past Psychiatric History: See H&P  Past Medical History:  Past Medical History:  Diagnosis Date  . Asthma   . Depression 2009   Inpatient psych admission for SI, dissociative fugue  . Dissociative disorder or reaction 2009  . Eczema   . H/O: suicide attempt   . ODD  (oppositional defiant disorder)   . PTSD (post-traumatic stress disorder)   . Schizoaffective disorder (HCC)   . Substance abuse [facility])     Past Surgical History:  Procedure Laterality Date  . ADENOIDECTOMY    . TONSILLECTOMY     Family History:  Family History  Adopted: Yes  Problem Relation Age of Onset  . Mental illness Mother   . Mental illness Father   . Heart disease Maternal Grandmother   . Sickle cell trait Adoptive Mother   . Cleft palate Cousin        maternal side   Family Psychiatric  History: See H&P Social History:  Social History   Substance and Sexual Activity  Alcohol Use Yes  . Alcohol/week: 8.0 standard drinks  . Types: 8 Cans of beer per week   Comment: 1 case of beer/day     Social History   Substance and Sexual Activity  Drug Use Yes  . Types: Marijuana, "Crack" cocaine, Heroin, Methylphenidate    Social History   Socioeconomic History  . Marital status: Single    Spouse name: Not on file  . Number of children: 1  . Years of education: Not on file  . Highest education level: Not on file  Occupational History  . Not on file  Tobacco Use  . Smoking status: Former Smoker    Packs/day: 0.75    Years: 2.00    Pack years: 1.50    Types: Cigarettes  . Smokeless tobacco: Never Used  . Tobacco comment: pt states she is quitting today  Vaping Use  . Vaping Use: Never used  Substance and Sexual Activity  . Alcohol use: Yes  Alcohol/week: 8.0 standard drinks    Types: 8 Cans of beer per week    Comment: 1 case of beer/day  . Drug use: Yes    Types: Marijuana, "Crack" cocaine, Heroin, Methylphenidate  . Sexual activity: Not Currently    Partners: Male  Other Topics Concern  . Not on file  Social History Narrative   Is homeless   Her mother is her daughter's gaurdian   Multiple psychiatric hospitalizations, not on any medication currently   Substance use disorder   Social Determinants of Health   Financial Resource Strain: Not on  file  Food Insecurity: Not on file  Transportation Needs: Not on file  Physical Activity: Not on file  Stress: Not on file  Social Connections: Not on file   Additional Social History:                         Sleep: Fair  Appetite:  Fair  Current Medications: Current Facility-Administered Medications  Medication Dose Route Frequency Provider Last Rate Last Admin  . alum & mag hydroxide-simeth (MAALOX/MYLANTA) 200-200-20 MG/5ML suspension 30 mL  30 mL Oral Q4H PRN Clapacs, John T, MD      . escitalopram (LEXAPRO) tablet 30 mg  30 mg Oral Daily [name], MD   30 mg at 02/04/21 0829  . feeding supplement (ENSURE ENLIVE / ENSURE PLUS) liquid 237 mL  237 mL Oral BID BM [name], MD   237 mL at 02/04/21 0949  . hydrocerin (EUCERIN) cream   Topical BID PRN [name], MD   Given at 02/04/21 1348  . hydrOXYzine (ATARAX/VISTARIL) tablet 50 mg  50 mg Oral TID PRN Clapacs, [name], MD   50 mg at 02/02/21 2105  . ibuprofen (ADVIL) tablet 400 mg  400 mg Oral Q6H PRN [name], MD   400 mg at 02/02/21 4709  . influenza vac split quadrivalent PF (FLUARIX) injection 0.5 mL  0.5 mL Intramuscular Tomorrow-1000 [name], MD      . magnesium hydroxide (MILK OF MAGNESIA) suspension 30 mL  30 mL Oral Daily PRN Clapacs, John T, MD      . melatonin tablet 10 mg  10 mg Oral QHS [name], MD   10 mg at 02/03/21 2112  . multivitamin with minerals tablet 1 tablet  1 tablet Oral Daily [name], MD   1 tablet at 02/02/21 [phone]  . OLANZapine (ZYPREXA) tablet 5 mg  5 mg Oral QHS [name], MD      . traZODone (DESYREL) tablet 100 mg  100 mg Oral QHS PRN Clapacs, [name], MD   100 mg at 02/03/21 2112  . triamcinolone (KENALOG) 0.025 % cream   Topical BID [name], MD   Given at 02/04/21 [id number]    Lab Results: No results found for this or any previous visit (from the past 48 hour(s)).  Blood Alcohol level:  Lab Results  Component Value Date    ETH <10 01/30/2021   ETH 18 (H) 11/11/2020    Metabolic Disorder Labs: Lab Results  Component Value Date   HGBA1C 5.1 01/30/2021   MPG 99.67 01/30/2021   MPG 96.8 12/26/2017   Lab Results  Component Value Date   PROLACTIN 21.5 11/06/2016   PROLACTIN 49.4 (H) 08/13/2016   Lab Results  Component Value Date   CHOL 128 01/30/2021   TRIG 35 01/30/2021   HDL 99 01/30/2021  CHOLHDL 1.3 01/30/2021   VLDL 7 01/30/2021   LDLCALC 22 01/30/2021   LDLCALC 42 12/06/2018    Physical Findings: AIMS:  , ,  ,  ,    CIWA:    COWS:     Musculoskeletal: Strength & Muscle Tone: within normal limits Gait & Station: normal Patient leans: N/A  Psychiatric Specialty Exam: Physical Exam  Review of Systems  Blood pressure (!) 96/57, pulse 69, temperature 98.3 F (36.8 C), temperature source Oral, resp. rate 20, height 5' 6.54" (1.69 m), weight 57.2 kg, SpO2 (!) 79 %, unknown if currently breastfeeding.Body mass index is 20.01 kg/m.  General Appearance: Disheveled  Eye Contact:  Good  Speech:  Clear and Coherent  Volume:  Normal  Mood:  Euthymic  Affect:  Constricted  Thought Process:  Disorganized  Orientation:  Full (Time, Place, and Person)  Thought Content:  Hallucinations: Auditory and Paranoid Ideation  Suicidal Thoughts:  Yes.  with intent/plan  Homicidal Thoughts:  No  Memory:  Immediate;   Fair Recent;   Fair Remote;   Fair  Judgement:  Impaired  Insight:  Shallow  Psychomotor Activity:  Normal  Concentration:  Concentration: Fair  Recall:  [employer] of Knowledge:  Fair  Language:  Fair  Akathisia:  Negative  Handed:  Right  AIMS (if indicated):     Assets:  Communication Skills Desire for Improvement Financial Resources/Insurance Leisure Time Physical Health  ADL's:  Impaired  Cognition:  Impaired,  Mild  Sleep:  Number of Hours: 7     Treatment Plan Summary: Daily contact with patient to assess and evaluate symptoms and progress in treatment and  Medication management 1)PTSD, chronic- stable - Continue Lexapro 30 mg daily  2) Substance induced mood disorder- unstable - Patient continues to endorse suicidal ideations. Continue Lexapro 30 daily, continue Zyprexa 5 mg QHS started last night - Patient requesting long-term substance abuse treatment program. Will continue to search for placement with social work team. Completed physician form for [facility] application today  3) Substance induced psychosis- unstable - Zyprexa 5 mg QHS started overnight, will titrate as tolerated. 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

## 2021-02-04 NOTE — BHH Counselor (Signed)
CSW received call from Kathy from [facility] who reports that there will NO womens home for the next year.  Kathy, reports that at this time they are no longer accepting female applications, current home for women will transition to a [facility] where men can continue to receive treatment.    Keoki Mchargue, MSW, LCSW 02/04/2021 4:08 PM  

## 2021-02-04 NOTE — Plan of Care (Signed)
Patient has been active in the milieu. Alert and oriented and denying thoughts of self-harm. Expressing interest in going to a substance abuse treatment program. Patient is irritable and demanding but has no aggressive behaviors. Patient is refusing her Abilify and states " I just don't like this medication, I will never take it". Eating her meals and reports that she slept fine. Encouragements and support provided. Safety precautions maintained.

## 2021-02-04 NOTE — BHH Counselor (Signed)
CSW faxed referral information to [facility], [facility], and [facility] for review.   Vilma Meckel. Algis Greenhouse, MSW, LCSW, LCAS 02/04/2021 3:38 PM

## 2021-02-04 NOTE — Progress Notes (Signed)
Patient had no angry outbursts today. Was suspicious about her medications and when reminded it was the same thing she had the night before she took them. She requested clothes and toiletries so she could shower. Limits placed on amount of toiletries and items from the supply closet and patient accepted this without argument. No thoughts of SI vocalized.

## 2021-02-04 NOTE — Plan of Care (Signed)
  Problem: Education: Goal: Knowledge of [facility] Education information/materials will improve Outcome: Progressing Goal: Emotional status will improve Outcome: Progressing Goal: Mental status will improve Outcome: Progressing Goal: Verbalization of understanding the information provided will improve Outcome: Progressing   Problem: Activity: Goal: Interest or engagement in activities will improve Outcome: Progressing Goal: Sleeping patterns will improve Outcome: Progressing   Problem: Coping: Goal: Ability to verbalize frustrations and anger appropriately will improve Outcome: Progressing Goal: Ability to demonstrate self-control will improve Outcome: Progressing   Problem: Health Behavior/Discharge Planning: Goal: Identification of resources available to assist in meeting health care needs will improve Outcome: Progressing Goal: Compliance with treatment plan for underlying cause of condition will improve Outcome: Progressing   Problem: Physical Regulation: Goal: Ability to maintain clinical measurements within normal limits will improve Outcome: Progressing   Problem: Safety: Goal: Periods of time without injury will increase Outcome: Progressing   Problem: Education: Goal: Ability to make informed decisions regarding treatment will improve Outcome: Progressing   Problem: Coping: Goal: Coping ability will improve Outcome: Progressing   Problem: Health Behavior/Discharge Planning: Goal: Identification of resources available to assist in meeting health care needs will improve Outcome: Progressing   Problem: Medication: Goal: Compliance with prescribed medication regimen will improve Outcome: Progressing   Problem: Self-Concept: Goal: Ability to disclose and discuss suicidal ideas will improve Outcome: Progressing Goal: Will verbalize positive feelings about self Outcome: Progressing   Problem: Activity: Goal: Will verbalize the importance of  balancing activity with adequate rest periods Outcome: Progressing   Problem: Education: Goal: Will be free of psychotic symptoms Outcome: Progressing Goal: Knowledge of the prescribed therapeutic regimen will improve Outcome: Progressing   Problem: Coping: Goal: Coping ability will improve Outcome: Progressing Goal: Will verbalize feelings Outcome: Progressing   Problem: Health Behavior/Discharge Planning: Goal: Compliance with prescribed medication regimen will improve Outcome: Progressing   Problem: Nutritional: Goal: Ability to achieve adequate nutritional intake will improve Outcome: Progressing   Problem: Role Relationship: Goal: Ability to communicate needs accurately will improve Outcome: Progressing Goal: Ability to interact with others will improve Outcome: Progressing   Problem: Safety: Goal: Ability to redirect hostility and anger into socially appropriate behaviors will improve Outcome: Progressing Goal: Ability to remain free from injury will improve Outcome: Progressing   Problem: Self-Care: Goal: Ability to participate in self-care as condition permits will improve Outcome: Progressing   Problem: Self-Concept: Goal: Will verbalize positive feelings about self Outcome: Progressing   

## 2021-02-04 NOTE — BHH Group Notes (Signed)
LCSW Group Therapy Note   02/04/2021 2:38 PM  Type of Therapy and Topic:  Group Therapy:  Overcoming Obstacles   Participation Level:  None   Description of Group:    In this group patients will be encouraged to explore what they see as obstacles to their own wellness and recovery. They will be guided to discuss their thoughts, feelings, and behaviors related to these obstacles. The group will process together ways to cope with barriers, with attention given to specific choices patients can make. Each patient will be challenged to identify changes they are motivated to make in order to overcome their obstacles. This group will be process-oriented, with patients participating in exploration of their own experiences as well as giving and receiving support and challenge from other group members.   Therapeutic Goals: 1. Patient will identify personal and current obstacles as they relate to admission. 2. Patient will identify barriers that currently interfere with their wellness or overcoming obstacles.  3. Patient will identify feelings, thought process and behaviors related to these barriers. 4. Patient will identify two changes they are willing to make to overcome these obstacles:      Summary of Patient Progress Patient kept walking in and out of group without much interaction within the group process.      Therapeutic Modalities:   Cognitive Behavioral Therapy Solution Focused Therapy Motivational Interviewing Relapse Prevention Therapy  Simona Huh R. Algis Greenhouse, MSW, LCSW, LCAS 02/04/2021 2:38 PM

## 2021-02-04 NOTE — Progress Notes (Signed)
Recreation Therapy Notes   Date: 02/04/2021  Time: 9:30 am   Location: Craft room   Behavioral response: Appropriate  Intervention Topic: Relaxation   Discussion/Intervention:  Group content today was focused on relaxation. The group defined relaxation and identified healthy ways to relax. Individuals expressed how much time they spend relaxing. Patients expressed how much their life would be if they did not make time for themselves to relax. The group stated ways they could improve their relaxation techniques in the future.  Individuals participated in the intervention "Time to Relax" where they had a chance to experience different relaxation techniques.  Clinical Observations/Feedback: Patient came to group and was focused on what peers and staff had to say about relaxation. Individual was social with peers and staff while participating in the intervention.  Marguerite Jarboe LRT/CTRS          Tekeshia Klahr 02/04/2021 12:19 PM

## 2021-02-05 DIAGNOSIS — F4310 Post-traumatic stress disorder, unspecified: Secondary | ICD-10-CM | POA: Diagnosis not present

## 2021-02-05 NOTE — Progress Notes (Signed)
Pt is speaking in a different accent than in previous encounters. Pt is requesting additional soap and is agitated when she can only receive one bottle, as she has been noted to be asking for additional soaps every shift.

## 2021-02-05 NOTE — Progress Notes (Signed)
Recreation Therapy Notes   Date: 02/05/2021  Time: 9:30 am   Location: Craft room   Behavioral response: Appropriate  Intervention Topic: Communication    Discussion/Intervention:  Group content today was focused on communication. The group defined communication and ways to communicate with others. Individuals stated reason why communication is important and some reasons to communicate with others. Patients expressed if they thought they were good at communicating with others and ways they could improve their communication skills. The group identified important parts of communication and some experiences they have had in the past with communication. The group participated in the intervention "Words in a Bag", where they had a chance to test out their communication skills and identify ways to improve their communication techniques.  Clinical Observations/Feedback: Patient came to group late due to unknown reasons. Individual was social with peers and staff while participating in the intervention.  Shakila Mak LRT/CTRS         Xayvier Vallez 02/05/2021 12:16 PM

## 2021-02-05 NOTE — Progress Notes (Signed)
Patient pleasant and cooperative. Denies SI, HI, AVH. Medication compliant, but suspicious of meds. Continues to ask for bandaids for cut on hand. Pt noted walking around milieu smiling and laughing inappropriately. Patient voiced no concerns or complaints. Encouragement and support provided. Safety checks maintained. Medications given as prescribed. Pt receptive and remains safe on unit with q 15 min checks.

## 2021-02-05 NOTE — Plan of Care (Signed)
  Problem: Education: Goal: Mental status will improve Outcome: Not Progressing Goal: Verbalization of understanding the information provided will improve Outcome: Not Progressing   Problem: Activity: Goal: Interest or engagement in activities will improve Outcome: Not Progressing Goal: Sleeping patterns will improve Outcome: Progressing   Problem: Coping: Goal: Ability to demonstrate self-control will improve Outcome: Progressing   Problem: Health Behavior/Discharge Planning: Goal: Compliance with treatment plan for underlying cause of condition will improve Outcome: Progressing   Problem: Safety: Goal: Periods of time without injury will increase Outcome: Progressing

## 2021-02-05 NOTE — BHH Group Notes (Signed)
BHH Group Notes:  (Nursing/MHT/Case Management/Adjunct)  Date:  02/05/2021  Time:  9:40 PM  Type of Therapy:  Group Therapy  Participation Level:  Active  Participation Quality:  Appropriate  Affect:  Appropriate  Cognitive:  Alert  Insight:  Good  Engagement in Group:  Engaged and said she was having an ok day.  Modes of Intervention:  Support  Summary of Progress/Problems:  Bridget Carpenter 02/05/2021, 9:40 PM

## 2021-02-05 NOTE — BHH Group Notes (Signed)
LCSW Group Therapy Note  02/05/2021 2:17 PM  Type of Therapy/Topic:  Group Therapy:  Feelings about Diagnosis  Participation Level:  None   Description of Group:   This group will allow patients to explore their thoughts and feelings about diagnoses they have received. Patients will be guided to explore their level of understanding and acceptance of these diagnoses. Facilitator will encourage patients to process their thoughts and feelings about the reactions of others to their diagnosis and will guide patients in identifying ways to discuss their diagnosis with significant others in their lives. This group will be process-oriented, with patients participating in exploration of their own experiences, giving and receiving support, and processing challenge from other group members.   Therapeutic Goals: 1. Patient will demonstrate understanding of diagnosis as evidenced by identifying two or more symptoms of the disorder 2. Patient will be able to express two feelings regarding the diagnosis 3. Patient will demonstrate their ability to communicate their needs through discussion and/or role play  Summary of Patient Progress: X  Therapeutic Modalities:   Cognitive Behavioral Therapy Brief Therapy Feelings Identification   Penni Homans, MSW, LCSW 02/05/2021 2:17 PM

## 2021-02-05 NOTE — BHH Counselor (Addendum)
CSW contacted [facility] [phone]) to set up screening for pt. Appointment set for 2:00pm tomorrow, 02/06/21. CSW was informed to tell the pt to call the main number and ask to be connected with the intake department. No other concerns expressed. Contact ended without incident.   Vilma Meckel. Algis Greenhouse, MSW, LCSW, LCAS 02/05/2021 3:26 PM

## 2021-02-05 NOTE — Progress Notes (Signed)
Patient denies SI and HI. She endorses auditory hallucinations and states, "I am hearing voices over and over again. They are telling me to listen to them." This writer asked what they were saying or if they were telling her to do anything. Patient does not state what the voices are saying. Patient was observed to be talking to herself and responding to internal stimuli while walking around on the unit. Patient also endorses pain and says she has a headache and that her hand hurts. Ibuprofen was given.  Patient is given medications per MD orders. Patient was compliant with her morning medications and did not appear to be suspicious of her medications this morning. Support and encouragement was provided.   Pt remains safe on the unit at this time. Q15 minute safety checks are maintained.

## 2021-02-05 NOTE — Progress Notes (Signed)
[facility] MD Progress Note  02/05/2021 4:08 PM [name] Katherin Ramey  MRN:  [id number]   CC "I'm depressed" Subjective:  Ms.Puff is a  30y.o. female with PTSD, stimulant use disorder, substance induced psychosis, and substance induced mood disorder presenting with suicidal and homicidal ideations.No acute events overnight, attending to ADLs, medication compliant.   Patient seen one-on-one today. She states she is feeling depressed today because she was really hoping to go to [facility] program. She continues to express interest in [facility] and [facility]. Desire for these programs relayed to social work team to assist with interview process. She denies suicidal ideations, homicidal ideations, visual hallucinations, and auditory hallucinations.    Principal Problem: PTSD (post-traumatic stress disorder) Diagnosis: Principal Problem:   PTSD (post-traumatic stress disorder) Active Problems:   Borderline personality disorder (HCC)   Cocaine use disorder, severe, dependence (HCC)   Cannabis use disorder, moderate, dependence (HCC)   Psychoactive substance-induced psychosis (HCC)   Stimulant use disorder   Insomnia due to mental disorder  Total Time spent with patient: 30 minutes  Past Psychiatric History: See H&P  Past Medical History:  Past Medical History:  Diagnosis Date  . Asthma   . Depression 2009   Inpatient psych admission for SI, dissociative fugue  . Dissociative disorder or reaction 2009  . Eczema   . H/O: suicide attempt   . ODD (oppositional defiant disorder)   . PTSD (post-traumatic stress disorder)   . Schizoaffective disorder (HCC)   . Substance abuse [facility])     Past Surgical History:  Procedure Laterality Date  . ADENOIDECTOMY    . TONSILLECTOMY     Family History:  Family History  Adopted: Yes  Problem Relation Age of Onset  . Mental illness Mother   . Mental illness Father   . Heart disease Maternal Grandmother   . Sickle cell trait Adoptive Mother    . Cleft palate Cousin        maternal side   Family Psychiatric  History: See H&P Social History:  Social History   Substance and Sexual Activity  Alcohol Use Yes  . Alcohol/week: 8.0 standard drinks  . Types: 8 Cans of beer per week   Comment: 1 case of beer/day     Social History   Substance and Sexual Activity  Drug Use Yes  . Types: Marijuana, "Crack" cocaine, Heroin, Methylphenidate    Social History   Socioeconomic History  . Marital status: Single    Spouse name: Not on file  . Number of children: 1  . Years of education: Not on file  . Highest education level: Not on file  Occupational History  . Not on file  Tobacco Use  . Smoking status: Former Smoker    Packs/day: 0.75    Years: 2.00    Pack years: 1.50    Types: Cigarettes  . Smokeless tobacco: Never Used  . Tobacco comment: pt states she is quitting today  Vaping Use  . Vaping Use: Never used  Substance and Sexual Activity  . Alcohol use: Yes    Alcohol/week: 8.0 standard drinks    Types: 8 Cans of beer per week    Comment: 1 case of beer/day  . Drug use: Yes    Types: Marijuana, "Crack" cocaine, Heroin, Methylphenidate  . Sexual activity: Not Currently    Partners: Male  Other Topics Concern  . Not on file  Social History Narrative   Is homeless   Her mother is her daughter's gaurdian  Multiple psychiatric hospitalizations, not on any medication currently   Substance use disorder   Social Determinants of Health   Financial Resource Strain: Not on file  Food Insecurity: Not on file  Transportation Needs: Not on file  Physical Activity: Not on file  Stress: Not on file  Social Connections: Not on file   Additional Social History:                         Sleep: Fair  Appetite:  Fair  Current Medications: Current Facility-Administered Medications  Medication Dose Route Frequency Provider Last Rate Last Admin  . alum & mag hydroxide-simeth (MAALOX/MYLANTA) 200-200-20  MG/5ML suspension 30 mL  30 mL Oral Q4H PRN Clapacs, John T, MD      . escitalopram (LEXAPRO) tablet 30 mg  30 mg Oral Daily [name], MD   30 mg at 02/05/21 0806  . feeding supplement (ENSURE ENLIVE / ENSURE PLUS) liquid 237 mL  237 mL Oral BID BM [name], MD   237 mL at 02/05/21 1451  . hydrocerin (EUCERIN) cream   Topical BID PRN [name], MD   Given at 02/04/21 1348  . hydrOXYzine (ATARAX/VISTARIL) tablet 50 mg  50 mg Oral TID PRN Clapacs, [name], MD   50 mg at 02/04/21 1551  . ibuprofen (ADVIL) tablet 400 mg  400 mg Oral Q6H PRN [name], MD   400 mg at 02/05/21 0810  . influenza vac split quadrivalent PF (FLUARIX) injection 0.5 mL  0.5 mL Intramuscular Tomorrow-1000 [name], MD      . magnesium hydroxide (MILK OF MAGNESIA) suspension 30 mL  30 mL Oral Daily PRN Clapacs, John T, MD      . melatonin tablet 10 mg  10 mg Oral QHS [name], MD   10 mg at 02/04/21 2114  . multivitamin with minerals tablet 1 tablet  1 tablet Oral Daily [name], MD   1 tablet at 02/05/21 [phone]  . OLANZapine (ZYPREXA) tablet 5 mg  5 mg Oral QHS [name], MD   5 mg at 02/04/21 2114  . traZODone (DESYREL) tablet 100 mg  100 mg Oral QHS PRN Clapacs, [name], MD   100 mg at 02/04/21 2114  . triamcinolone (KENALOG) 0.025 % cream   Topical BID [name], MD   Given at 02/05/21 7902    Lab Results: No results found for this or any previous visit (from the past 48 hour(s)).  Blood Alcohol level:  Lab Results  Component Value Date   ETH <10 01/30/2021   ETH 18 (H) 11/11/2020    Metabolic Disorder Labs: Lab Results  Component Value Date   HGBA1C 5.1 01/30/2021   MPG 99.67 01/30/2021   MPG 96.8 12/26/2017   Lab Results  Component Value Date   PROLACTIN 21.5 11/06/2016   PROLACTIN 49.4 (H) 08/13/2016   Lab Results  Component Value Date   CHOL 128 01/30/2021   TRIG 35 01/30/2021   HDL 99 01/30/2021   CHOLHDL 1.3 01/30/2021   VLDL 7  01/30/2021   LDLCALC 22 01/30/2021   LDLCALC 42 12/06/2018    Physical Findings: AIMS:  , ,  ,  ,    CIWA:    COWS:     Musculoskeletal: Strength & Muscle Tone: within normal limits Gait & Station: normal Patient leans: N/A  Psychiatric Specialty Exam: Physical Exam   Review of Systems  Blood pressure (!) 116/57, pulse 76, temperature 98.4 F (36.9 C), temperature source Oral, resp. rate 20, height 5' 6.54" (1.69 m), weight 57.2 kg, SpO2 100 %, unknown if currently breastfeeding.Body mass index is 20.01 kg/m.  General Appearance: Fairly Groomed  Eye Contact:  Good  Speech:  Clear and Coherent  Volume:  Normal  Mood:  Depressed  Affect:  Congruent and Constricted  Thought Process:  Coherent and Linear  Orientation:  Full (Time, Place, and Person)  Thought Content:  Logical  Suicidal Thoughts:  No  Homicidal Thoughts:  No  Memory:  Immediate;   Fair Recent;   Fair Remote;   Fair  Judgement:  Intact  Insight:  Shallow  Psychomotor Activity:  Normal  Concentration:  Concentration: Fair  Recall:  Fair  Fund of Knowledge:  Fair  Language:  Fair  Akathisia:  Negative  Handed:  Right  AIMS (if indicated):     Assets:  Communication Skills Desire for Improvement Financial Resources/Insurance Leisure Time Physical Health  ADL's:  Impaired  Cognition:  Impaired,  Mild  Sleep:  Number of Hours: 8     Treatment Plan Summary: Daily contact with patient to assess and evaluate symptoms and progress in treatment and Medication management 1)PTSD, chronic- stable - Continue Lexapro 30 mg daily  2) Substance induced mood disorder- unstable -  Continue Lexapro 30 daily, continue Zyprexa 5 mg QHS  - Patient requesting long-term substance abuse treatment program. Will continue to search for placement with social work team.   3) Substance induced psychosis- improved. No longer endorsing auditory hallucinations.  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

## 2021-02-05 NOTE — BHH Counselor (Signed)
CSW called to check status of referrals:  [facility] "Currently behind on treatment beds. Will be about 2 weeks before beds are available."  [facility] Referral has been received. Has not been reviewed at this time.   Penni Homans, MSW, LCSW 02/05/2021 3:10 PM

## 2021-02-06 DIAGNOSIS — F4310 Post-traumatic stress disorder, unspecified: Secondary | ICD-10-CM | POA: Diagnosis not present

## 2021-02-06 LAB — HEPATITIS PANEL, ACUTE
HCV Ab: NONREACTIVE
Hep A IgM: NONREACTIVE
Hep B C IgM: NONREACTIVE
Hepatitis B Surface Ag: NONREACTIVE

## 2021-02-06 LAB — CHLAMYDIA/NGC RT PCR (ARMC ONLY)
Chlamydia Tr: NOT DETECTED
N gonorrhoeae: DETECTED — AB

## 2021-02-06 LAB — PREGNANCY, URINE: Preg Test, Ur: NEGATIVE

## 2021-02-06 LAB — HIV ANTIBODY (ROUTINE TESTING W REFLEX): HIV Screen 4th Generation wRfx: NONREACTIVE

## 2021-02-06 NOTE — Progress Notes (Signed)
[facility] MD Progress Note  02/06/2021 1:07 PM [name] Corinn Stoltzfus  MRN:  [id number]   CC "I want to go to rehab" Subjective:  Ms.Victorian is a  30y.o. female with PTSD, stimulant use disorder, substance induced psychosis, and substance induced mood disorder presenting with suicidal and homicidal ideations.No acute events overnight, attending to ADLs, medication compliant.   Patient seen one-on-one today. She notes that she is anxious to start her substance abuse treatment. She is excited for her interview with [facility] today at 2pm. She denies suicidal ideations, homicidal ideations, visual hallucinations, and auditory hallucinations. She does complain of vaginal discomfort today, and is concerned about possible STDs. She requests that I test her for gonorrhea, chlamydia, syphilis, and HIV. She also requests repeat pregnancy test.   Principal Problem: PTSD (post-traumatic stress disorder) Diagnosis: Principal Problem:   PTSD (post-traumatic stress disorder) Active Problems:   Borderline personality disorder (HCC)   Cocaine use disorder, severe, dependence (HCC)   Cannabis use disorder, moderate, dependence (HCC)   Psychoactive substance-induced psychosis (HCC)   Stimulant use disorder   Insomnia due to mental disorder  Total Time spent with patient: 30 minutes  Past Psychiatric History: See H&P  Past Medical History:  Past Medical History:  Diagnosis Date  . Asthma   . Depression 2009   Inpatient psych admission for SI, dissociative fugue  . Dissociative disorder or reaction 2009  . Eczema   . H/O: suicide attempt   . ODD (oppositional defiant disorder)   . PTSD (post-traumatic stress disorder)   . Schizoaffective disorder (HCC)   . Substance abuse [facility])     Past Surgical History:  Procedure Laterality Date  . ADENOIDECTOMY    . TONSILLECTOMY     Family History:  Family History  Adopted: Yes  Problem Relation Age of Onset  . Mental illness Mother   . Mental illness Father   .  Heart disease Maternal Grandmother   . Sickle cell trait Adoptive Mother   . Cleft palate Cousin        maternal side   Family Psychiatric  History: See H&P Social History:  Social History   Substance and Sexual Activity  Alcohol Use Yes  . Alcohol/week: 8.0 standard drinks  . Types: 8 Cans of beer per week   Comment: 1 case of beer/day     Social History   Substance and Sexual Activity  Drug Use Yes  . Types: Marijuana, "Crack" cocaine, Heroin, Methylphenidate    Social History   Socioeconomic History  . Marital status: Single    Spouse name: Not on file  . Number of children: 1  . Years of education: Not on file  . Highest education level: Not on file  Occupational History  . Not on file  Tobacco Use  . Smoking status: Former Smoker    Packs/day: 0.75    Years: 2.00    Pack years: 1.50    Types: Cigarettes  . Smokeless tobacco: Never Used  . Tobacco comment: pt states she is quitting today  Vaping Use  . Vaping Use: Never used  Substance and Sexual Activity  . Alcohol use: Yes    Alcohol/week: 8.0 standard drinks    Types: 8 Cans of beer per week    Comment: 1 case of beer/day  . Drug use: Yes    Types: Marijuana, "Crack" cocaine, Heroin, Methylphenidate  . Sexual activity: Not Currently    Partners: Male  Other Topics Concern  . Not on file  Social History  Narrative   Is homeless   Her mother is her daughter's gaurdian   Multiple psychiatric hospitalizations, not on any medication currently   Substance use disorder   Social Determinants of Health   Financial Resource Strain: Not on file  Food Insecurity: Not on file  Transportation Needs: Not on file  Physical Activity: Not on file  Stress: Not on file  Social Connections: Not on file   Additional Social History:                         Sleep: Fair  Appetite:  Fair  Current Medications: Current Facility-Administered Medications  Medication Dose Route Frequency Provider Last Rate  Last Admin  . alum & mag hydroxide-simeth (MAALOX/MYLANTA) 200-200-20 MG/5ML suspension 30 mL  30 mL Oral Q4H PRN Clapacs, John T, MD      . escitalopram (LEXAPRO) tablet 30 mg  30 mg Oral Daily [name], MD   30 mg at 02/06/21 0829  . feeding supplement (ENSURE ENLIVE / ENSURE PLUS) liquid 237 mL  237 mL Oral BID BM [name], MD   237 mL at 02/05/21 1451  . hydrocerin (EUCERIN) cream   Topical BID PRN [name], MD   1 application at 02/06/21 [phone]  . hydrOXYzine (ATARAX/VISTARIL) tablet 50 mg  50 mg Oral TID PRN Clapacs, [name], MD   50 mg at 02/05/21 2131  . ibuprofen (ADVIL) tablet 400 mg  400 mg Oral Q6H PRN [name], MD   400 mg at 02/05/21 0810  . influenza vac split quadrivalent PF (FLUARIX) injection 0.5 mL  0.5 mL Intramuscular Tomorrow-1000 [name], MD      . magnesium hydroxide (MILK OF MAGNESIA) suspension 30 mL  30 mL Oral Daily PRN Clapacs, John T, MD      . melatonin tablet 10 mg  10 mg Oral QHS [name], MD   10 mg at 02/05/21 2129  . multivitamin with minerals tablet 1 tablet  1 tablet Oral Daily [name], MD   1 tablet at 02/05/21 [phone]  . OLANZapine (ZYPREXA) tablet 5 mg  5 mg Oral QHS [name], MD   5 mg at 02/05/21 2129  . traZODone (DESYREL) tablet 100 mg  100 mg Oral QHS PRN Clapacs, [name], MD   100 mg at 02/05/21 2130  . triamcinolone (KENALOG) 0.025 % cream   Topical BID [name], MD   1 application at 02/06/21 0830    Lab Results: No results found for this or any previous visit (from the past 48 hour(s)).  Blood Alcohol level:  Lab Results  Component Value Date   ETH <10 01/30/2021   ETH 18 (H) 11/11/2020    Metabolic Disorder Labs: Lab Results  Component Value Date   HGBA1C 5.1 01/30/2021   MPG 99.67 01/30/2021   MPG 96.8 12/26/2017   Lab Results  Component Value Date   PROLACTIN 21.5 11/06/2016   PROLACTIN 49.4 (H) 08/13/2016   Lab Results  Component Value Date   CHOL 128  01/30/2021   TRIG 35 01/30/2021   HDL 99 01/30/2021   CHOLHDL 1.3 01/30/2021   VLDL 7 01/30/2021   LDLCALC 22 01/30/2021   LDLCALC 42 12/06/2018    Physical Findings: AIMS:  , ,  ,  ,    CIWA:    COWS:     Musculoskeletal: Strength & Muscle Tone: within normal limits Gait & Station:  normal Patient leans: N/A  Psychiatric Specialty Exam: Physical Exam   Review of Systems   Blood pressure (!) 108/56, pulse 69, temperature 98 F (36.7 C), temperature source Oral, resp. rate 18, height 5' 6.54" (1.69 m), weight 57.2 kg, SpO2 100 %, unknown if currently breastfeeding.Body mass index is 20.01 kg/m.  General Appearance: Fairly Groomed  Eye Contact:  Good  Speech:  Clear and Coherent  Volume:  Normal  Mood:  Euthymic  Affect:  Congruent and Full Range  Thought Process:  Coherent, Goal Directed and Linear  Orientation:  Full (Time, Place, and Person)  Thought Content:  Logical  Suicidal Thoughts:  No  Homicidal Thoughts:  No  Memory:  Immediate;   Fair Recent;   Fair Remote;   Fair  Judgement:  Intact  Insight:  Shallow  Psychomotor Activity:  Normal  Concentration:  Concentration: Fair  Recall:  [employer] of Knowledge:  Fair  Language:  Fair  Akathisia:  Negative  Handed:  Right  AIMS (if indicated):     Assets:  Communication Skills Desire for Improvement Financial Resources/Insurance Leisure Time Physical Health  ADL's:  Impaired  Cognition:  Impaired,  Mild  Sleep:  Number of Hours: 7     Treatment Plan Summary: Daily contact with patient to assess and evaluate symptoms and progress in treatment and Medication management 1)PTSD, chronic- stable - Continue Lexapro 30 mg daily  2) Substance induced mood disorder- stable -  Continue Lexapro 30 daily, continue Zyprexa 5 mg QHS  - Patient requesting long-term substance abuse treatment program. She has an interview today with [facility] at 2pm.  - Patient concerned about infection from sharing needs. HIV test  and acute hepatitis panel today  3) Substance induced psychosis- improved. No longer endorsing auditory hallucinations.  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

## 2021-02-06 NOTE — Progress Notes (Signed)
Recreation Therapy Notes   Date: 02/06/2021  Time: 9:30 am   Location: Craft room   Behavioral response: Appropriate  Intervention Topic: Leisure   Discussion/Intervention:  Group content today was focused on leisure. The group defined what leisure is and some positive leisure activities they participate in. Individuals identified the difference between good and bad leisure. Participants expressed how they feel after participating in the leisure of their choice. The group discussed how they go about picking a leisure activity and if others are involved in their leisure activities. The patient stated how many leisure activities they have to choose from and reasons why it is important to have leisure time. Individuals participated in the intervention "Exploration of Leisure" where they had a chance to identify new leisure activities as well as benefits of leisure. Clinical Observations/Feedback: Patient came to group late due to unknown reasons.  Individual was social with peers and staff while participating in the intervention.  Boni Maclellan LRT/CTRS         Leightyn Cina 02/06/2021 12:35 PM

## 2021-02-06 NOTE — Progress Notes (Signed)
Pt is alert and oriented to person, place, time and situation. Pt is calm, cooperative, denies suicidal and homicidal ideation, denies hallucinations, denies feelings of depression and anxiety. Pt has been interacting with peers, is childlike at times, no acute distress noted. Pt is interacting with peers and staff appropriately, reports her mood is good, affect is bright. Pt attended outdoor group, however, displayed some bizzare behavior, kept walking in and out of that group returning to her room, and changing her clothes often, returned 3 times with 3 different outfits on. When asked why pt was changing her clothes so often, pt refused to provide an answer, and simply just walked away. Pt voices no complaints to this writer, no distress noted, none reported, will continue to monitor pt per Q24minute face checks and monitor for safety and progress.

## 2021-02-06 NOTE — B[facility] Counselor (Signed)
CSW assisted the patient in completing an interview with [facility].  Patient appeared to decompensated as the interview continued.  Patient initially was clear, however, became disorganized and agitated, not aggressive, but difficult to focus as the interview continued.  Patient was a poor historian and and some difficulty answering questions.  Patient answered some questions inappropriately without a clear connection between the two.    Patient was initially denied for the program, however, later the interviewed clarified that patient could be accepted with some additional information. Patient provided conflicting information on asthma and will need some form of clearance on asthma prior to admission. Patient will aslo need to have a full psychological or notes from psychiatrist on patients current hospital stay. Patient was denied for upcoming court dates on May 16th, however, could be admitted with a pending court letter if the above information is provided and patients information is reviewed again.  Patient will also need a 60 day supply of medications.  Information will need to be faxed to [phone], attention Intake Department.  Penni Homans, MSW, LCSW 02/06/2021 3:35 PM

## 2021-02-06 NOTE — B[facility] Group Notes (Signed)
  LCSW Group Therapy Note     02/06/2021 2:36 PM   Type of Therapy/Topic:  Group Therapy:  Emotion Regulation   Participation Level:  Did Not Attend  Description of Group:   The purpose of this group is to assist patients in learning to regulate negative emotions and experience positive emotions. Patients will be guided to discuss ways in which they have been vulnerable to their negative emotions. These vulnerabilities will be juxtaposed with experiences of positive emotions or situations, and patients will be challenged to use positive emotions to combat negative ones. Special emphasis will be placed on coping with negative emotions in conflict situations, and patients will process healthy conflict resolution skills.    Therapeutic Goals:  1.    Patient will identify two positive emotions or experiences to reflect on in order to balance out negative emotions  2.    Patient will label two or more emotions that they find the most difficult to experience  3.    Patient will demonstrate positive conflict resolution skills through discussion and/or role plays   Summary of Patient Progress:  X  Therapeutic Modalities:   Cognitive Behavioral Therapy  Feelings Identification  Dialectical Behavioral Therapy   Davyd Podgorski [place], MSW, LCSW-A  02/06/2021 2:36 PM

## 2021-02-06 NOTE — Tx Team (Signed)
Interdisciplinary Treatment and Diagnostic Plan Update  02/06/2021 Time of Session: 08:30 [name] MRN: [id number]  Principal Diagnosis: PTSD (post-traumatic stress disorder)  Secondary Diagnoses: Principal Problem:   PTSD (post-traumatic stress disorder) Active Problems:   Borderline personality disorder (HCC)   Cocaine use disorder, severe, dependence (HCC)   Cannabis use disorder, moderate, dependence (HCC)   Psychoactive substance-induced psychosis (HCC)   Stimulant use disorder   Insomnia due to mental disorder   Current Medications:  Current Facility-Administered Medications  Medication Dose Route Frequency Provider Last Rate Last Admin  . alum & mag hydroxide-simeth (MAALOX/MYLANTA) 200-200-20 MG/5ML suspension 30 mL  30 mL Oral Q4H PRN Clapacs, John T, MD      . escitalopram (LEXAPRO) tablet 30 mg  30 mg Oral Daily [name], MD   30 mg at 02/06/21 0829  . feeding supplement (ENSURE ENLIVE / ENSURE PLUS) liquid 237 mL  237 mL Oral BID BM [name], MD   237 mL at 02/05/21 1451  . hydrocerin (EUCERIN) cream   Topical BID PRN [name], MD   1 application at 02/06/21 [phone]  . hydrOXYzine (ATARAX/VISTARIL) tablet 50 mg  50 mg Oral TID PRN Clapacs, [name], MD   50 mg at 02/05/21 2131  . ibuprofen (ADVIL) tablet 400 mg  400 mg Oral Q6H PRN [name], MD   400 mg at 02/05/21 0810  . influenza vac split quadrivalent PF (FLUARIX) injection 0.5 mL  0.5 mL Intramuscular Tomorrow-1000 [name], MD      . magnesium hydroxide (MILK OF MAGNESIA) suspension 30 mL  30 mL Oral Daily PRN Clapacs, John T, MD      . melatonin tablet 10 mg  10 mg Oral QHS [name], MD   10 mg at 02/05/21 2129  . multivitamin with minerals tablet 1 tablet  1 tablet Oral Daily [name], MD   1 tablet at 02/05/21 [phone]  . OLANZapine (ZYPREXA) tablet 5 mg  5 mg Oral QHS [name], MD   5 mg at 02/05/21 2129  . traZODone (DESYREL) tablet 100 mg  100  mg Oral QHS PRN Clapacs, [name], MD   100 mg at 02/05/21 2130  . triamcinolone (KENALOG) 0.025 % cream   Topical BID [name], MD   1 application at 02/06/21 0830   PTA Medications: Medications Prior to Admission  Medication Sig Dispense Refill Last Dose  . escitalopram (LEXAPRO) 20 MG tablet Take 1 tablet (20 mg total) by mouth daily. 30 tablet 1   . pseudoephedrine (SUDAFED) 30 MG tablet Take 1 tablet (30 mg total) by mouth every 6 (six) hours as needed for congestion. (Patient not taking: Reported on 01/31/2021) 24 tablet 0   . risperiDONE (RISPERDAL) 2 MG tablet Take 2 mg by mouth at bedtime. (Patient not taking: No sig reported)       Patient Stressors: Financial difficulties Substance abuse  Patient Strengths: Capable of independent living Motivation for treatment/growth  Treatment Modalities: Medication Management, Group therapy, Case management,  1 to 1 session with clinician, Psychoeducation, Recreational therapy.   Physician Treatment Plan for Primary Diagnosis: PTSD (post-traumatic stress disorder) Long Term Goal(s): Improvement in symptoms so as ready for discharge Improvement in symptoms so as ready for discharge   Short Term Goals: Ability to identify changes in lifestyle to reduce recurrence of condition will improve Ability to verbalize feelings will improve Ability to disclose and discuss suicidal ideas Ability to demonstrate  self-control will improve Ability to identify and develop effective coping behaviors will improve Compliance with prescribed medications will improve Ability to identify triggers associated with substance abuse/mental health issues will improve Ability to identify changes in lifestyle to reduce recurrence of condition will improve Ability to verbalize feelings will improve Ability to disclose and discuss suicidal ideas Ability to demonstrate self-control will improve Ability to identify and develop effective coping behaviors will  improve Compliance with prescribed medications will improve Ability to identify triggers associated with substance abuse/mental health issues will improve  Medication Management: Evaluate patient's response, side effects, and tolerance of medication regimen.  Therapeutic Interventions: 1 to 1 sessions, Unit Group sessions and Medication administration.  Evaluation of Outcomes: Progressing  Physician Treatment Plan for Secondary Diagnosis: Principal Problem:   PTSD (post-traumatic stress disorder) Active Problems:   Borderline personality disorder (HCC)   Cocaine use disorder, severe, dependence (HCC)   Cannabis use disorder, moderate, dependence (HCC)   Psychoactive substance-induced psychosis (HCC)   Stimulant use disorder   Insomnia due to mental disorder  Long Term Goal(s): Improvement in symptoms so as ready for discharge Improvement in symptoms so as ready for discharge   Short Term Goals: Ability to identify changes in lifestyle to reduce recurrence of condition will improve Ability to verbalize feelings will improve Ability to disclose and discuss suicidal ideas Ability to demonstrate self-control will improve Ability to identify and develop effective coping behaviors will improve Compliance with prescribed medications will improve Ability to identify triggers associated with substance abuse/mental health issues will improve Ability to identify changes in lifestyle to reduce recurrence of condition will improve Ability to verbalize feelings will improve Ability to disclose and discuss suicidal ideas Ability to demonstrate self-control will improve Ability to identify and develop effective coping behaviors will improve Compliance with prescribed medications will improve Ability to identify triggers associated with substance abuse/mental health issues will improve     Medication Management: Evaluate patient's response, side effects, and tolerance of medication  regimen.  Therapeutic Interventions: 1 to 1 sessions, Unit Group sessions and Medication administration.  Evaluation of Outcomes: Progressing   RN Treatment Plan for Primary Diagnosis: PTSD (post-traumatic stress disorder) Long Term Goal(s): Knowledge of disease and therapeutic regimen to maintain health will improve  Short Term Goals: Ability to remain free from injury will improve, Ability to verbalize frustration and anger appropriately will improve, Ability to demonstrate self-control, Ability to verbalize feelings will improve, Ability to disclose and discuss suicidal ideas, Ability to identify and develop effective coping behaviors will improve and Compliance with prescribed medications will improve  Medication Management: RN will administer medications as ordered by provider, will assess and evaluate patient's response and provide education to patient for prescribed medication. RN will report any adverse and/or side effects to prescribing provider.  Therapeutic Interventions: 1 on 1 counseling sessions, Psychoeducation, Medication administration, Evaluate responses to treatment, Monitor vital signs and CBGs as ordered, Perform/monitor CIWA, COWS, AIMS and Fall Risk screenings as ordered, Perform wound care treatments as ordered.  Evaluation of Outcomes: Progressing   LCSW Treatment Plan for Primary Diagnosis: PTSD (post-traumatic stress disorder) Long Term Goal(s): Safe transition to appropriate next level of care at discharge, Engage patient in therapeutic group addressing interpersonal concerns.  Short Term Goals: Engage patient in aftercare planning with referrals and resources, Increase social support, Increase emotional regulation, Facilitate patient progression through stages of change regarding substance use diagnoses and concerns, Identify triggers associated with mental health/substance abuse issues and Increase skills for wellness and recovery  Therapeutic Interventions:  Assess for all discharge needs, 1 to 1 time with Child psychotherapist, Explore available resources and support systems, Assess for adequacy in community support network, Educate family and significant other(s) on suicide prevention, Complete Psychosocial Assessment, Interpersonal group therapy.  Evaluation of Outcomes: Progressing   Progress in Treatment: Attending groups: Yes. Participating in groups: Yes. Taking medication as prescribed: Yes. Toleration medication: Yes. Family/Significant other contact made: No, will contact:  when given permission. Patient understands diagnosis: Yes. Discussing patient identified problems/goals with staff: Yes. Medical problems stabilized or resolved: Yes. Denies suicidal/homicidal ideation: Yes. Issues/concerns per patient self-inventory: No. Other: None.  New problem(s) identified: No, Describe:  none.  New Short Term/Long Term Goal(s): detox, elimination of symptoms of psychosis, medication management for mood stabilization; elimination of SI thoughts; development of comprehensive mental wellness/sobriety plan. Update 02/06/21: No changes at this time.  Patient Goals: "Housing and long-term treatment." Update 02/06/21: No changes at this time.  Discharge Plan or Barriers: Pt expressed interest in long-term treatment upon discharge. Update 02/06/21: No changes at this time.   Reason for Continuation of Hospitalization: Delusions  Medication stabilization Suicidal ideation  Estimated Length of Stay: 1-7 days  Attendees: Patient:  02/06/2021 9:34 AM  Physician: Les Pou, MD 02/06/2021 9:34 AM  Nursing:  02/06/2021 9:34 AM  RN Care Manager: 02/06/2021 9:34 AM  Social Worker: Penni Homans, MSW, LCSW 02/06/2021 9:34 AM  Recreational Therapist:  02/06/2021 9:34 AM  Other: Vilma Meckel. Algis Greenhouse, MSW, [place], LCAS 02/06/2021 9:34 AM  Other: Kiva [place], MSW, LCSW-A 02/06/2021 9:34 AM  Other: 02/06/2021 9:34 AM    Scribe for Treatment Team: Glenis Smoker,  LCSW 02/06/2021 9:34 AM

## 2021-02-06 NOTE — [facility] Counselor (Signed)
CSW contacted [facility].  Patient's chart remains under review.  Penni Homans, MSW, LCSW 02/06/2021 11:32 AM

## 2021-02-06 NOTE — Progress Notes (Signed)
Cooperative with treatment, pleasant on approach medication compliant, she denies SI, HI and AVH. She requires some redirection she is constantly at nurse's station with request. Patient is currently in bed resting quietly at this time. No behavioral issues to report on shift at this time.

## 2021-02-07 DIAGNOSIS — F4310 Post-traumatic stress disorder, unspecified: Secondary | ICD-10-CM | POA: Diagnosis not present

## 2021-02-07 LAB — RPR: RPR Ser Ql: NONREACTIVE

## 2021-02-07 MED ORDER — CEFTRIAXONE SODIUM 1 G IJ SOLR
1.0000 g | Freq: Once | INTRAMUSCULAR | Status: AC
  Administered 2021-02-07: 1 g via INTRAMUSCULAR
  Filled 2021-02-07: qty 10

## 2021-02-07 NOTE — Progress Notes (Signed)
Patient alert and oriented x 4, affect is blunted thoughts are disorganized and incoherent at times, she denies SI/HI/AVH but noted responding to internal stimuli no distress noted at this time, 15 minutes safety checks maintained will continue to monitor.

## 2021-02-07 NOTE — Progress Notes (Signed)
[facility] attempted to get patients vital signs. Patient refused stating she wanted a sleeve. This writer attempted to take patient vitals, cleaning cuff and machine in front of patient. Pt then agreed. Told her we needed to wait 2 minutes so that it could kill any germs. Pt states she still does not want her vital signs done. States she dont want any of that touching her at all.

## 2021-02-07 NOTE — [facility] Counselor (Signed)
CSW has attempted to contact [employer].  CSW has had to leave several voicemail messages.    Penni Homans, MSW, LCSW 02/07/2021 2:22 PM

## 2021-02-07 NOTE — [facility] Counselor (Signed)
CSW called [facility] to check on the status of the patient's referral.  CSW was informed again that the referral is still awaiting a review from the doctor.   Penni Homans, MSW, LCSW 02/07/2021 1:33 PM

## 2021-02-07 NOTE — [facility] Counselor (Signed)
CSW faxed the requested information to [facility].  CSW received confirmation the fax was successful.  Penni Homans, MSW, LCSW 02/07/2021 3:55 PM

## 2021-02-07 NOTE — [facility] Group Notes (Signed)
LCSW Group Therapy Note  02/07/2021 2:16 PM  Type of Therapy/Topic:  Group Therapy:  Balance in Life  Participation Level:  Did Not Attend  Description of Group:    This group will address the concept of balance and how it feels and looks when one is unbalanced. Patients will be encouraged to process areas in their lives that are out of balance and identify reasons for remaining unbalanced. Facilitators will guide patients in utilizing problem-solving interventions to address and correct the stressor making their life unbalanced. Understanding and applying boundaries will be explored and addressed for obtaining and maintaining a balanced life. Patients will be encouraged to explore ways to assertively make their unbalanced needs known to significant others in their lives, using other group members and facilitator for support and feedback.  Therapeutic Goals: 1. Patient will identify two or more emotions or situations they have that consume much of in their lives. 2. Patient will identify signs/triggers that life has become out of balance:  3. Patient will identify two ways to set boundaries in order to achieve balance in their lives:  4. Patient will demonstrate ability to communicate their needs through discussion and/or role plays  Summary of Patient Progress: X  Therapeutic Modalities:   Cognitive Behavioral Therapy Solution-Focused Therapy Assertiveness Training  Jacalynn Buzzell R. Jeymi Hepp, MSW, LCSW, LCAS 02/07/2021 2:16 PM   

## 2021-02-07 NOTE — Progress Notes (Signed)
Patient pleasant and cooperative. Continues to attempt to hoard food and supplies. Pt noted responding walking up and down hall. Preoccupied. Denies any SI, HI, avh. Pt disorganized at times. Pt interacting with peers, continues to tell peers and staff "I love you" Patient voiced no complaints. Encouragement and support provided. Safety checks maintained. Medications given as prescribed. Pt receptive and remains safe on unit with q 15 min checks.

## 2021-02-07 NOTE — Progress Notes (Signed)
[facility] MD Progress Note  02/07/2021 2:47 PM [name]  MRN:  [id number]   CC "Am I going to rehab today?" Subjective:  Bridget Carpenter is a  29y.o. female with PTSD, stimulant use disorder, substance induced psychosis, and substance induced mood disorder presenting with suicidal and homicidal ideations.No acute events overnight, attending to ADLs, medication compliant.   Patient seen one-on-one today. She notes that she was hoping to go to [place] today. Explained that she has not currently gotten into this program yet, and they have requested more information. Concern brought up about history of asthma. She is not currently on any active medications for asthma, and has not required a rescue inhaler, no active symptoms. Last documented fill of albuterol inhaler was in 2018. She denies suicidal ideations, homicidal ideations, visual hallucinations, and auditory hallucinations. Attempts also made several times today to contact [facility] for discharge today, but unable to make contact. STD work up from yesterday positive for gonorrheae.   Principal Problem: PTSD (post-traumatic stress disorder) Diagnosis: Principal Problem:   PTSD (post-traumatic stress disorder) Active Problems:   Borderline personality disorder (HCC)   Cocaine use disorder, severe, dependence (HCC)   Cannabis use disorder, moderate, dependence (HCC)   Psychoactive substance-induced psychosis (HCC)   Stimulant use disorder   Insomnia due to mental disorder  Total Time spent with patient: 30 minutes  Past Psychiatric History: See H&P  Past Medical History:  Past Medical History:  Diagnosis Date  . Asthma   . Depression 2009   Inpatient psych admission for SI, dissociative fugue  . Dissociative disorder or reaction 2009  . Eczema   . H/O: suicide attempt   . ODD (oppositional defiant disorder)   . PTSD (post-traumatic stress disorder)   . Schizoaffective disorder (HCC)   . Substance abuse [facility])     Past  Surgical History:  Procedure Laterality Date  . ADENOIDECTOMY    . TONSILLECTOMY     Family History:  Family History  Adopted: Yes  Problem Relation Age of Onset  . Mental illness Mother   . Mental illness Father   . Heart disease Maternal Grandmother   . Sickle cell trait Adoptive Mother   . Cleft palate Cousin        maternal side   Family Psychiatric  History: See H&P Social History:  Social History   Substance and Sexual Activity  Alcohol Use Yes  . Alcohol/week: 8.0 standard drinks  . Types: 8 Cans of beer per week   Comment: 1 case of beer/day     Social History   Substance and Sexual Activity  Drug Use Yes  . Types: Marijuana, "Crack" cocaine, Heroin, Methylphenidate    Social History   Socioeconomic History  . Marital status: Single    Spouse name: Not on file  . Number of children: 1  . Years of education: Not on file  . Highest education level: Not on file  Occupational History  . Not on file  Tobacco Use  . Smoking status: Former Smoker    Packs/day: 0.75    Years: 2.00    Pack years: 1.50    Types: Cigarettes  . Smokeless tobacco: Never Used  . Tobacco comment: pt states she is quitting today  Vaping Use  . Vaping Use: Never used  Substance and Sexual Activity  . Alcohol use: Yes    Alcohol/week: 8.0 standard drinks    Types: 8 Cans of beer per week    Comment: 1 case of beer/day  .  Drug use: Yes    Types: Marijuana, "Crack" cocaine, Heroin, Methylphenidate  . Sexual activity: Not Currently    Partners: Male  Other Topics Concern  . Not on file  Social History Narrative   Is homeless   Her mother is her daughter's gaurdian   Multiple psychiatric hospitalizations, not on any medication currently   Substance use disorder   Social Determinants of Health   Financial Resource Strain: Not on file  Food Insecurity: Not on file  Transportation Needs: Not on file  Physical Activity: Not on file  Stress: Not on file  Social Connections:  Not on file   Additional Social History:                         Sleep: Fair  Appetite:  Fair  Current Medications: Current Facility-Administered Medications  Medication Dose Route Frequency Provider Last Rate Last Admin  . alum & mag hydroxide-simeth (MAALOX/MYLANTA) 200-200-20 MG/5ML suspension 30 mL  30 mL Oral Q4H PRN Clapacs, John T, MD      . escitalopram (LEXAPRO) tablet 30 mg  30 mg Oral Daily [name], MD   30 mg at 02/07/21 0800  . feeding supplement (ENSURE ENLIVE / ENSURE PLUS) liquid 237 mL  237 mL Oral BID BM [name], MD   237 mL at 02/05/21 1451  . hydrocerin (EUCERIN) cream   Topical BID PRN [name], MD   1 application at 02/06/21 [phone]  . hydrOXYzine (ATARAX/VISTARIL) tablet 50 mg  50 mg Oral TID PRN Clapacs, [name], MD   50 mg at 02/05/21 2131  . ibuprofen (ADVIL) tablet 400 mg  400 mg Oral Q6H PRN [name], MD   400 mg at 02/05/21 0810  . influenza vac split quadrivalent PF (FLUARIX) injection 0.5 mL  0.5 mL Intramuscular Tomorrow-1000 [name], MD      . magnesium hydroxide (MILK OF MAGNESIA) suspension 30 mL  30 mL Oral Daily PRN Clapacs, John T, MD      . melatonin tablet 10 mg  10 mg Oral QHS [name], MD   10 mg at 02/06/21 2102  . multivitamin with minerals tablet 1 tablet  1 tablet Oral Daily [name], MD   1 tablet at 02/07/21 0801  . OLANZapine (ZYPREXA) tablet 5 mg  5 mg Oral QHS [name], MD   5 mg at 02/06/21 2102  . traZODone (DESYREL) tablet 100 mg  100 mg Oral QHS PRN Clapacs, [name], MD   100 mg at 02/06/21 2102  . triamcinolone (KENALOG) 0.025 % cream   Topical BID [name], MD   1 application at 02/06/21 1633    Lab Results:  Results for orders placed or performed during the hospital encounter of 01/31/21 (from the past 48 hour(s))  RPR     Status: None   Collection Time: 02/06/21  1:29 PM  Result Value Ref Range   RPR Ser Ql NON REACTIVE NON REACTIVE    Comment:  Performed at [facility], [address], [place], [place] [address]  HIV Antibody (routine testing w rflx)     Status: None   Collection Time: 02/06/21  1:29 PM  Result Value Ref Range   HIV Screen 4th Generation wRfx Non Reactive Non Reactive    Comment: Performed at [facility], [address], [place], [place] [address]  Hepatitis panel, acute  Status: None   Collection Time: 02/06/21  1:29 PM  Result Value Ref Range   Hepatitis B Surface Ag NON REACTIVE NON REACTIVE   HCV Ab NON REACTIVE NON REACTIVE    Comment: (NOTE) Nonreactive HCV antibody screen is consistent with no HCV infections,  unless recent infection is suspected or other evidence exists to indicate HCV infection.     Hep A IgM NON REACTIVE NON REACTIVE   Hep B C IgM NON REACTIVE NON REACTIVE    Comment: Performed at [facility], [address], [place], [place] [address]  Chlamydia/NGC rt PCR [facility] only)     Status: Abnormal   Collection Time: 02/06/21  6:41 PM   Specimen: Urine  Result Value Ref Range   Specimen source GC/Chlam URINE, RANDOM    Chlamydia Tr NOT DETECTED NOT DETECTED   N gonorrhoeae DETECTED (A) NOT DETECTED    Comment: (NOTE) This CT/NG assay has not been evaluated in patients with a history of  hysterectomy. Performed at [facility], [address] [place], [place] [address]   Pregnancy, urine     Status: None   Collection Time: 02/06/21  6:41 PM  Result Value Ref Range   Preg Test, Ur NEGATIVE NEGATIVE    Comment: Performed at [facility], [address] [place], [place] [address]    Blood Alcohol level:  Lab Results  Component Value Date   ETH <10 01/30/2021   ETH 18 (H) 11/11/2020    Metabolic Disorder Labs: Lab Results  Component Value Date   HGBA1C 5.1 01/30/2021   MPG 99.67 01/30/2021   MPG 96.8 12/26/2017   Lab Results  Component Value Date   PROLACTIN 21.5 11/06/2016   PROLACTIN 49.4 (H) 08/13/2016   Lab Results   Component Value Date   CHOL 128 01/30/2021   TRIG 35 01/30/2021   HDL 99 01/30/2021   CHOLHDL 1.3 01/30/2021   VLDL 7 01/30/2021   LDLCALC 22 01/30/2021   LDLCALC 42 12/06/2018    Physical Findings: AIMS:  , ,  ,  ,    CIWA:    COWS:     Musculoskeletal: Strength & Muscle Tone: within normal limits Gait & Station: normal Patient leans: N/A  Psychiatric Specialty Exam: Physical Exam   Review of Systems   Blood pressure (!) 108/56, pulse 69, temperature 98 F (36.7 C), temperature source Oral, resp. rate 18, height 5' 6.54" (1.69 m), weight 57.2 kg, SpO2 100 %, unknown if currently breastfeeding.Body mass index is 20.01 kg/m.  General Appearance: Fairly Groomed  Eye Contact:  Good  Speech:  Clear and Coherent  Volume:  Normal  Mood:  Euthymic  Affect:  Congruent and Full Range  Thought Process:  Coherent, Goal Directed and Linear  Orientation:  Full (Time, Place, and Person)  Thought Content:  Logical  Suicidal Thoughts:  No  Homicidal Thoughts:  No  Memory:  Immediate;   Fair Recent;   Fair Remote;   Fair  Judgement:  Intact  Insight:  Shallow  Psychomotor Activity:  Normal  Concentration:  Concentration: Fair  Recall:  [employer] of Knowledge:  Fair  Language:  Fair  Akathisia:  Negative  Handed:  Right  AIMS (if indicated):     Assets:  Communication Skills Desire for Improvement Financial Resources/Insurance Leisure Time Physical Health  ADL's:  Impaired  Cognition:  Impaired,  Mild  Sleep:  Number of Hours: 7.5     Treatment Plan Summary: Daily contact  with patient to assess and evaluate symptoms and progress in treatment and Medication management 1)PTSD, chronic- stable - Continue Lexapro 30 mg daily  2) Substance induced mood disorder- stable -  Continue Lexapro 30 daily, continue Zyprexa 5 mg QHS  - Patient requesting long-term substance abuse treatment program. She has an interview today with [facility] at 2pm.  - Patient concerned about  infection from sharing needs. HIV test and acute hepatitis panel today  3) Substance induced psychosis- improved. No longer endorsing auditory hallucinations.  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 STD work up positive for gonorrhoeae - 1g ceftriaxone IM today   02/07/21: Psychiatric exam above reviewed and remains accurate. Assessment and plan above reviewed and updated.   [name], MD 02/07/2021, 2:47 PM

## 2021-02-07 NOTE — Progress Notes (Signed)
Recreation Therapy Notes  Date: 02/07/2021  Time: 9:30 am   Location: Craft room   Behavioral response: Appropriate,Restless   Intervention Topic: Coping-skills    Discussion/Intervention:  Group content on today was focused on coping skills. The group defined what coping skills are and when they normally use coping skills. Individuals described how they normally cope with thing and the coping skills they normally use. Patients expressed why it is important to cope with things and how not coping with things can affect you. The group participated in the intervention "My coping box" and made coping boxes while adding coping skills they could use in the future to the box. Clinical Observations/Feedback: Patient came to group late due to unknown reasons. Individual was social with peers and staff while participating in the intervention.  Shawntay Prest LRT/CTRS            Arvella Massingale 02/07/2021 12:36 PM

## 2021-02-07 NOTE — Plan of Care (Signed)
  Problem: Education: Goal: Emotional status will improve Outcome: Progressing Goal: Mental status will improve Outcome: Not Progressing Goal: Verbalization of understanding the information provided will improve Outcome: Progressing   Problem: Activity: Goal: Interest or engagement in activities will improve Outcome: Progressing Goal: Sleeping patterns will improve Outcome: Progressing   Problem: Coping: Goal: Ability to verbalize frustrations and anger appropriately will improve Outcome: Progressing   Problem: Health Behavior/Discharge Planning: Goal: Compliance with treatment plan for underlying cause of condition will improve Outcome: Progressing   Problem: Safety: Goal: Periods of time without injury will increase Outcome: Progressing

## 2021-02-08 DIAGNOSIS — F4310 Post-traumatic stress disorder, unspecified: Secondary | ICD-10-CM | POA: Diagnosis not present

## 2021-02-08 MED ORDER — ESCITALOPRAM OXALATE 20 MG PO TABS: 30.0000 mg | ORAL_TABLET | Freq: Every day | ORAL | 1 refills | Status: AC

## 2021-02-08 MED ORDER — MELATONIN 10 MG PO TABS: 10.0000 mg | ORAL_TABLET | Freq: Every day | ORAL | 1 refills | Status: DC

## 2021-02-08 NOTE — BHH Suicide Risk Assessment (Signed)
[facility] Discharge Suicide Risk Assessment   Principal Problem: PTSD (post-traumatic stress disorder) Discharge Diagnoses: Principal Problem:   PTSD (post-traumatic stress disorder) Active Problems:   Borderline personality disorder (HCC)   Cocaine use disorder, severe, dependence (HCC)   Cannabis use disorder, moderate, dependence (HCC)   Psychoactive substance-induced psychosis (HCC)   Stimulant use disorder   Insomnia due to mental disorder   Total Time spent with patient: 35 minutes- 25 minutes face-to-face with patient, 10 minutes coordination of discharge, documentation, scripts  Musculoskeletal: Strength & Muscle Tone: within normal limits Gait & Station: normal Patient leans: N/A  Psychiatric Specialty Exam: Review of Systems  Constitutional: Negative for appetite change and fatigue.  HENT: Negative for rhinorrhea and sore throat.   Eyes: Negative for photophobia and visual disturbance.  Respiratory: Negative for cough and shortness of breath.   Cardiovascular: Negative for chest pain and palpitations.  Gastrointestinal: Negative for constipation, diarrhea, nausea and vomiting.  Endocrine: Negative for cold intolerance and heat intolerance.  Genitourinary: Negative for difficulty urinating and dysuria.  Musculoskeletal: Negative for arthralgias and myalgias.  Skin: Negative for rash and wound.  Allergic/Immunologic: Positive for food allergies. Negative for immunocompromised state.  Neurological: Negative for dizziness and light-headedness.  Hematological: Negative for adenopathy. Does not bruise/bleed easily.  Psychiatric/Behavioral: Negative for hallucinations, sleep disturbance and suicidal ideas.    Blood pressure 95/65, pulse 84, temperature 98.2 F (36.8 C), temperature source Oral, resp. rate 18, height 5' 6.54" (1.69 m), weight 57.2 kg, SpO2 100 %, unknown if currently breastfeeding.Body mass index is 20.01 kg/m.  General Appearance: Fairly Groomed  Patent attorney::   Good  Speech:  Normal Rate  Volume:  Normal  Mood:  Euthymic  Affect:  Congruent  Thought Process:  Coherent  Orientation:  Full (Time, Place, and Person)  Thought Content:  Logical  Suicidal Thoughts:  No  Homicidal Thoughts:  No  Memory:  Immediate;   Fair  Judgement:  Intact  Insight:  Fair  Psychomotor Activity:  Normal  Concentration:  Fair  Recall:  [employer] of Knowledge:Fair  Language: Fair  Akathisia:  Negative  Handed:  Right  AIMS (if indicated):     Assets:  Communication Skills Desire for Improvement Financial Resources/Insurance Physical Health Resilience  Sleep:  Number of Hours: 7.5  Cognition: WNL  ADL's:  Intact   Mental Status Per Nursing Assessment::   On Admission:  Suicidal ideation indicated by patient  Demographic Factors:  Unemployed  Loss Factors: Legal issues  Historical Factors: Impulsivity  Risk Reduction Factors:   Responsible for children under 57 years of age, Sense of responsibility to family, Religious beliefs about death, Living with another person, especially a relative, Positive social support, Positive therapeutic relationship and Positive coping skills or problem solving skills  Continued Clinical Symptoms:  Depression:   Recent sense of peace/wellbeing Alcohol/Substance Abuse/Dependencies Previous Psychiatric Diagnoses and Treatments  Cognitive Features That Contribute To Risk:  None    Suicide Risk:  Minimal: No identifiable suicidal ideation.  Patients presenting with no risk factors but with morbid ruminations; may be classified as minimal risk based on the severity of the depressive symptoms   Follow-up Information    [facility] Follow up.   Specialty: Addiction Medicine Why: Referral has been sent, they have a two week waitlist currenty.  Thanks! Contact information: [address] [place] [address] [phone]        [facility] Follow up.  Why: Referral has been sent to [facility] and remains under review. Thanks! Contact information: [address] [place] [address] [phone]        [facility] Follow up.   Why: Walk in hours are Monday through Friday at 8:30AM.  Thanks! Contact information: [address]  [place], [place] [address]  Phone: [phone]  Fax: [phone]              Plan Of Care/Follow-up recommendations:  Activity:  as tolerated Diet:  regular diet  [name], MD 02/08/2021, 10:55 AM

## 2021-02-08 NOTE — Discharge Summary (Signed)
Physician Discharge Summary Note  Patient:  Bridget Carpenter is an 30 y.o., female MRN:  [id number] DOB:  September 19, 1991 Patient phone:  [phone] (home)  Patient address:   [place] [address],  Total Time spent with patient: 35 minutes- 25 minutes face-to-face with patient, 10 minutes coordination of discharge, documentation, scripts  Date of Admission:  01/31/2021 Date of Discharge: 02/08/2021  Reason for Admission:  Patient is a 30 year old female with history of PTSD, stimulant use disorder, substance induced psychosis, and substance induced mood disorder presenting voluntarily with suicidal and homicidal ideations  Principal Problem: PTSD (post-traumatic stress disorder) Discharge Diagnoses: Principal Problem:   PTSD (post-traumatic stress disorder) Active Problems:   Borderline personality disorder (HCC)   Cocaine use disorder, severe, dependence ([place])   Cannabis use disorder, moderate, dependence ([place])   Psychoactive substance-induced psychosis ([place])   Stimulant use disorder   Insomnia due to mental disorder   Past Psychiatric History: Patient has a history of PTSD, substance induced psychosis, substance induced mood disorder, stimulant use disorder, and alcohol use disorder. She has been hospitalized numerous times in the past. Most recently at [facility] 05/04/2019 and at our hospital 12/04/18. She has had numerous ED visits in the interim. She has been tried on several past medications including Bupropion 75 mg BID, Cymbalta, Abilify 20 mg daily, Abilify Maintenna 400 mg IM monthly injections, trazodone, Haldol 1 mg BID, gabapentin, Remeron 15 mg QHS, Zyprexa 10 mg QHS, Invega 6 mg daily, Seroquel 200 mg QHS, Risperdal 2 mg BID, Lexapro 20 mg daily, and Effexor 150 mg daily. She is not currently seeing a psychiatrist or therapist. She has a history of suicide attempts.   Past Medical History:  Past Medical History:  Diagnosis Date  . Asthma   . Depression 2009    Inpatient psych admission for SI, dissociative fugue  . Dissociative disorder or reaction 2009  . Eczema   . H/O: suicide attempt   . ODD (oppositional defiant disorder)   . PTSD (post-traumatic stress disorder)   . Schizoaffective disorder ([place])   . Substance abuse [facility])     Past Surgical History:  Procedure Laterality Date  . ADENOIDECTOMY    . TONSILLECTOMY     Family History:  Family History  Adopted: Yes  Problem Relation Age of Onset  . Mental illness Mother   . Mental illness Father   . Heart disease Maternal Grandmother   . Sickle cell trait Adoptive Mother   . Cleft palate Cousin        maternal side   Family Psychiatric  History: Biological mother with unknown mental illness and substance abuse. Unknown if any family members attempted suicide.  Social History:  Social History   Substance and Sexual Activity  Alcohol Use Yes  . Alcohol/week: 8.0 standard drinks  . Types: 8 Cans of beer per week   Comment: 1 case of beer/day     Social History   Substance and Sexual Activity  Drug Use Yes  . Types: Marijuana, "Crack" cocaine, Heroin, Methylphenidate    Social History   Socioeconomic History  . Marital status: Single    Spouse name: Not on file  . Number of children: 1  . Years of education: Not on file  . Highest education level: Not on file  Occupational History  . Not on file  Tobacco Use  . Smoking status: Former Smoker    Packs/day: 0.75    Years: 2.00    Pack years: 1.50    Types:  Cigarettes  . Smokeless tobacco: Never Used  . Tobacco comment: pt states she is quitting today  Vaping Use  . Vaping Use: Never used  Substance and Sexual Activity  . Alcohol use: Yes    Alcohol/week: 8.0 standard drinks    Types: 8 Cans of beer per week    Comment: 1 case of beer/day  . Drug use: Yes    Types: Marijuana, "Crack" cocaine, Heroin, Methylphenidate  . Sexual activity: Not Currently    Partners: Male  Other Topics Concern  . Not on file   Social History Narrative   Is homeless   Her mother is her daughter's gaurdian   Multiple psychiatric hospitalizations, not on any medication currently   Substance use disorder   Social Determinants of Health   Financial Resource Strain: Not on file  Food Insecurity: Not on file  Transportation Needs: Not on file  Physical Activity: Not on file  Stress: Not on file  Social Connections: Not on file    Hospital Course:  Patient is a 30 year old female with history of PTSD, stimulant use disorder, substance induced psychosis, and substance induced mood disorder presenting with suicidal and homicidal ideations. While on the unit she was only willing to take Lexapro and Melatonin and these were tirated to 30 mg and 10 mg respectively. She refused Abilify and only took Zyprexa twice before discontinuing. While she would benefit from an atypical antipsychotic for mood stabilization and psychosis, she never met criteria for non-emergent forced medications. She was calm and cooperative on unit, and social with peers. She was noted to be positive for gonorrhea, and treated with 1 mg IM ceftriaxone. She is discharging to the [facility] and will follow-up with [place] for outpatient mental health. Application for [facility], [facility], and [facility] pending. She was provided with numbers to call for follow-up.   Physical Findings: AIMS:  , ,  ,  ,    CIWA:    COWS:     Musculoskeletal: Strength & Muscle Tone: within normal limits Gait & Station: normal Patient leans: N/A  Psychiatric Specialty Exam: Physical Exam Vitals and nursing note reviewed.  Constitutional:      Appearance: Normal appearance.  HENT:     Head: Normocephalic and atraumatic.     Right Ear: External ear normal.     Left Ear: External ear normal.     Nose: Nose normal.     Mouth/Throat:     Mouth: Mucous membranes are moist.     Pharynx: Oropharynx is clear.  Eyes:     Extraocular Movements: Extraocular movements  intact.     Conjunctiva/sclera: Conjunctivae normal.     Pupils: Pupils are equal, round, and reactive to light.  Cardiovascular:     Rate and Rhythm: Normal rate.     Pulses: Normal pulses.  Pulmonary:     Effort: Pulmonary effort is normal.     Breath sounds: Normal breath sounds.  Abdominal:     General: Abdomen is flat.     Palpations: Abdomen is soft.  Musculoskeletal:        General: No swelling. Normal range of motion.     Cervical back: Normal range of motion and neck supple.  Skin:    General: Skin is warm and dry.  Neurological:     General: No focal deficit present.     Mental Status: She is alert and oriented to person, place, and time.  Psychiatric:        Mood and  Affect: Mood normal.        Behavior: Behavior normal.        Thought Content: Thought content normal.        Judgment: Judgment normal.     Review of Systems  Constitutional: Negative for appetite change and fatigue.  HENT: Negative for rhinorrhea and sore throat.   Eyes: Negative for photophobia and visual disturbance.  Respiratory: Negative for cough and shortness of breath.   Cardiovascular: Negative for chest pain and palpitations.  Gastrointestinal: Negative for constipation, diarrhea, nausea and vomiting.  Endocrine: Negative for cold intolerance and heat intolerance.  Genitourinary: Negative for difficulty urinating and dysuria.  Musculoskeletal: Negative for arthralgias and myalgias.  Skin: Negative for rash and wound.  Allergic/Immunologic: Positive for food allergies. Negative for immunocompromised state.  Neurological: Negative for dizziness and light-headedness.  Hematological: Negative for adenopathy. Does not bruise/bleed easily.  Psychiatric/Behavioral: Negative for hallucinations, sleep disturbance and suicidal ideas.    Blood pressure 95/65, pulse 84, temperature 98.2 F (36.8 C), temperature source Oral, resp. rate 18, height 5' 6.54" (1.69 m), weight 57.2 kg, SpO2 100 %, unknown  if currently breastfeeding.Body mass index is 20.01 kg/m.  General Appearance: Fairly Groomed  Engineer, water::  Good  Speech:  Normal Rate  Volume:  Normal  Mood:  Euthymic  Affect:  Congruent  Thought Process:  Coherent  Orientation:  Full (Time, Place, and Person)  Thought Content:  Logical  Suicidal Thoughts:  No  Homicidal Thoughts:  No  Memory:  Immediate;   Fair  Judgement:  Intact  Insight:  Fair  Psychomotor Activity:  Normal  Concentration:  Fair  Recall:  Avery of Knowledge:Fair  Language: Fair  Akathisia:  Negative  Handed:  Right  AIMS (if indicated):     Assets:  Communication Skills Desire for Improvement Financial Resources/Insurance Physical Health Resilience  Sleep:  Number of Hours: 7.5  Cognition: WNL  ADL's:  Intact           Has this patient used any form of tobacco in the last 30 days? (Cigarettes, Smokeless Tobacco, Cigars, and/or Pipes) No  Blood Alcohol level:  Lab Results  Component Value Date   ETH <10 01/30/2021   ETH 18 (H) 67/34/1937    Metabolic Disorder Labs:  Lab Results  Component Value Date   HGBA1C 5.1 01/30/2021   MPG 99.67 01/30/2021   MPG 96.8 12/26/2017   Lab Results  Component Value Date   PROLACTIN 21.5 11/06/2016   PROLACTIN 49.4 (H) 08/13/2016   Lab Results  Component Value Date   CHOL 128 01/30/2021   TRIG 35 01/30/2021   HDL 99 01/30/2021   CHOLHDL 1.3 01/30/2021   VLDL 7 01/30/2021   LDLCALC 22 01/30/2021   LDLCALC 42 12/06/2018    See Psychiatric Specialty Exam and Suicide Risk Assessment completed by Attending Physician prior to discharge.  Discharge destination:  Other:  [facility]  Is patient on multiple antipsychotic therapies at discharge:  No   Has Patient had three or more failed trials of antipsychotic monotherapy by history:  No  Recommended Plan for Multiple Antipsychotic Therapies: NA  Discharge Instructions    Diet general   Complete by: As directed    Increase  activity slowly   Complete by: As directed      Allergies as of 02/08/2021      Reactions   Banana Hives, Swelling   Cantaloupe (diagnostic) Hives   Grapefruit Extract Itching, Swelling   Albumin Human  Other (See Comments)   Pt refuses all blood products.    Collard Greens [lactuca Virosa]       Medication List    STOP taking these medications   pseudoephedrine 30 MG tablet Commonly known as: SUDAFED   risperiDONE 2 MG tablet Commonly known as: RISPERDAL     TAKE these medications     Indication  escitalopram 20 MG tablet Commonly known as: LEXAPRO Take 1.5 tablets (30 mg total) by mouth daily. Start taking on: February 09, 2021 What changed: how much to take  Indication: Major Depressive Disorder, Posttraumatic Stress Disorder   Melatonin 10 MG Tabs Take 10 mg by mouth at bedtime.  Indication: [place] Follow up.   Specialty: Addiction Medicine Why: Referral has been sent, they have a two week waitlist currenty.  Thanks! Contact information: [place] [address] [place], [facility] Follow up.   Why: Referral has been sent to [facility] and remains under review. Thanks! Contact information: [address] [place] [address] [place] Follow up.   Why: Walk in hours are Monday through Friday at 8:30AM.  Thanks! Contact information: [address]  [place], [place] [address]  Phone: [phone]  Fax: [phone]              Follow-up recommendations:  Activity:  as tolearted Diet:  regular diet  Comments:  Printed 30-day scripts with one refill provided to patient.   Signed: Salley Scarlet, MD 02/08/2021, 10:59 AM

## 2021-02-08 NOTE — Progress Notes (Signed)
Pt was educated on prescriptions, follow up care, when to call for help/suicide hotline. Pt questions were answered and pt verbalized understanding and did not voice any concerns. Pt's belongings were returned Pt was safely discharged to safe transport vehicle by [facility].

## 2021-02-08 NOTE — Progress Notes (Signed)
  [facility] Adult Case Management Discharge Plan :  Will you be returning to the same living situation after discharge:  No.  Patient will be going to [facility]. At discharge, do you have transportation home?: Yes,  CSW will assist with transportation. Do you have the ability to pay for your medications: Yes,  [facility]  Release of information consent forms completed and in the chart;  Patient's signature needed at discharge.  Patient to Follow up at:  Follow-up Information    [facility] Follow up.   Specialty: Addiction Medicine Why: Referral has been sent, they have a two week waitlist currenty.  Thanks! Contact information: [address] [place] [address] [phone]        Center, [facility] Follow up.   Why: Referral has been sent to [facility] and remains under review. Thanks! Contact information: [address] [place] [address] [phone]        [facility] Follow up.   Why: Walk in hours are Monday through Friday at 8:30AM.  Thanks! Contact information: [address]  [place], [place] [address]  Phone: [phone]  Fax: [phone]              Next level of care provider has access to [facility] Link:no  Safety Planning and Suicide Prevention discussed: Yes,  SPE completed with the patient.      Has patient been referred to the Quitline?: Patient refused referral  Patient has been referred for addiction treatment: Yes  Harden Mo, LCSW 02/08/2021, 10:53 AM

## 2021-02-08 NOTE — [facility] Counselor (Signed)
CSW assisted the patient in completing a screening with [employer]. Patient was approved for a face to face interview.    Patient was receptive.  CSW will provide patient with information on local resources in [facility].  Penni Homans, MSW, LCSW 02/08/2021 10:45 AM

## 2021-02-08 NOTE — Progress Notes (Signed)
Pt was irritable this morning. She took the Lexapro but refused the multivitamin. She could not give a reason for why she was refusing it. Patient endorses auditory hallucinations and says, "I don't listen to the voices". She will not tell this Clinical research associate what the voices are saying. She also endorses anxiety and was offered a PRN, but refused. Pt was observed to be talking to herself on the unit. When checking in on her in her room, patient had chair blocking the doorway. Patient also endorses suicidal thoughts, but is smiling as she tells this Clinical research associate. Pt did not identify a plan.  Medications were given per MD orders. Support and encouragement was provided. Patient remains safe on the unit at this time. Q15 minute safety checks are maintained.

## 2021-02-08 NOTE — [facility] Counselor (Signed)
CSW contact [employer] several times this morning and left HIPAA compliant voicemails.    CSW did attempt to call again and was able to speak with the individual who conducts interviews and was informed that she would receive a call back in 10 minutes.  CSW called [facility] as well and was informed that pt's referral remains under review from the second physician.  CSW asked if there was any way of noting that this is the 4th day in a row that this has been the message and that referrals are reviewed daily. CSW was transferred from [place] to [place] who reports that he will notate for the Dr. Magnus Ivan to review today.  Penni Homans, MSW, LCSW 02/08/2021 10:29 AM

## 2021-02-20 ENCOUNTER — Other Ambulatory Visit: Payer: Self-pay

## 2021-02-20 ENCOUNTER — Emergency Department
Admission: EM | Admit: 2021-02-20 | Discharge: 2021-02-21 | Disposition: A | Discharge status: Home / Self Care | Payer: Medicaid Other | Attending: Emergency Medicine | Admitting: Emergency Medicine

## 2021-02-20 ENCOUNTER — Encounter: Payer: Self-pay | Admitting: *Deleted

## 2021-02-20 DIAGNOSIS — L03012 Cellulitis of left finger: Secondary | ICD-10-CM | POA: Diagnosis not present

## 2021-02-20 DIAGNOSIS — J45909 Unspecified asthma, uncomplicated: Secondary | ICD-10-CM | POA: Diagnosis not present

## 2021-02-20 DIAGNOSIS — W4904XA Ring or other jewelry causing external constriction, initial encounter: Secondary | ICD-10-CM | POA: Diagnosis not present

## 2021-02-20 DIAGNOSIS — S60941A Unspecified superficial injury of left index finger, initial encounter: Secondary | ICD-10-CM | POA: Diagnosis present

## 2021-02-20 DIAGNOSIS — Z87891 Personal history of nicotine dependence: Secondary | ICD-10-CM | POA: Insufficient documentation

## 2021-02-20 DIAGNOSIS — S60451A Superficial foreign body of left index finger, initial encounter: Secondary | ICD-10-CM | POA: Diagnosis not present

## 2021-02-20 DIAGNOSIS — S60459A Superficial foreign body of unspecified finger, initial encounter: Secondary | ICD-10-CM

## 2021-02-20 MED ORDER — MUPIROCIN CALCIUM 2 % EX CREA: TOPICAL_CREAM | CUTANEOUS | 0 refills | Status: AC

## 2021-02-20 MED ORDER — SULFAMETHOXAZOLE-TRIMETHOPRIM 800-160 MG PO TABS: 1.0000 | ORAL_TABLET | Freq: Two times a day (BID) | ORAL | 0 refills | Status: DC

## 2021-02-20 NOTE — ED Provider Notes (Signed)
[facility] ([place]) Emergency Department Provider Note  ____________________________________________  Time seen: Approximately 11:01 PM  I have reviewed the triage vital signs and the nursing notes.   HISTORY  Chief Complaint Foreign Body    HPI [name] is a 30 y.o. female who presents the emergency department complaining of a ring on the index finger of the left hand.  Patient had a retained ring on the index finger.  Patient had macerated tissue trying to remove the ring.  Patient had no other injury or complaint.  She is well-known to this department and has a significant psych history but denies any psychiatric complaints at this time.        Past Medical History:  Diagnosis Date  . Asthma   . Depression 2009   Inpatient psych admission for SI, dissociative fugue  . Dissociative disorder or reaction 2009  . Eczema   . H/O: suicide attempt   . ODD (oppositional defiant disorder)   . PTSD (post-traumatic stress disorder)   . Schizoaffective disorder (HCC)   . Substance abuse [facility])     Patient Active Problem List   Diagnosis Date Noted  . Insomnia due to mental disorder 02/01/2021  . Adjustment disorder   . MDD (major depressive disorder), recurrent episode, severe (HCC) 08/31/2019  . Indication for care in labor and delivery, antepartum 08/29/2019  . Hydrops fetalis 08/29/2019  . Indication for care in labor or delivery 08/14/2019  . Dehydration 08/14/2019  . Rubella non-immune status, antepartum 08/01/2019  . Pain of round ligament affecting pregnancy, antepartum 08/01/2019  . Labor and delivery indication for care or intervention 07/30/2019  . Homelessness 07/25/2019  . Decreased fetal movement 07/02/2019  . Club foot, fetal, affecting care of mother, antepartum 06/29/2019  . Supervision of high risk pregnancy, antepartum 06/19/2019  . Cocaine abuse with cocaine-induced mood disorder (HCC) 04/15/2019  . Stimulant use disorder  09/11/2018  . 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/03/2018  . Bacterial vaginosis 12/25/2017  . PTSD (post-traumatic stress disorder) 09/02/2017  . MRSA carrier 07/18/2017  . Overdose 07/16/2017  . Aspiration pneumonia (HCC) 07/16/2017  . Acute respiratory failure (HCC) 07/16/2017  . Polysubstance abuse (HCC) 07/16/2017  . Eczema 05/08/2017  . Borderline personality disorder (HCC) 11/06/2016  . Cocaine use disorder, severe, dependence (HCC) 11/06/2016  . Cannabis use disorder, moderate, dependence (HCC) 11/06/2016  . Alcohol use disorder, mild, abuse 11/06/2016  . Asthma 09/23/2011  . Tobacco use disorder 09/15/2009    Past Surgical History:  Procedure Laterality Date  . ADENOIDECTOMY    . TONSILLECTOMY      Prior to Admission medications   Medication Sig Start Date End Date Taking? Authorizing Provider  escitalopram (LEXAPRO) 20 MG tablet Take 1.5 tablets (30 mg total) by mouth daily. 02/09/21   [name], MD  melatonin 10 MG TABS Take 10 mg by mouth at bedtime. 02/08/21   [name], MD    Allergies Banana, Cantaloupe (diagnostic), Grapefruit extract, Albumin human, and Collard greens [lactuca virosa]  Family History  Adopted: Yes  Problem Relation Age of Onset  . Mental illness Mother   . Mental illness Father   . Heart disease Maternal Grandmother   . Sickle cell trait Adoptive Mother   . Cleft palate Cousin        maternal side    Social History Social History   Tobacco Use  . Smoking status: Former Smoker    Packs/day: 0.75    Years: 2.00  Pack years: 1.50    Types: Cigarettes  . Smokeless tobacco: Never Used  . Tobacco comment: pt states she is quitting today  Vaping Use  . Vaping Use: Never used  Substance Use Topics  . Alcohol use: Yes    Alcohol/week: 8.0 standard drinks    Types: 8 Cans of beer per week    Comment: 1 case of beer/day  . Drug use: Yes    Types: Marijuana, "Crack" cocaine, Heroin, Methylphenidate      Review of Systems  Constitutional: No fever/chills Eyes: No visual changes. No discharge ENT: No upper respiratory complaints. Cardiovascular: no chest pain. Respiratory: no cough. No SOB. Gastrointestinal: No abdominal pain.  No nausea, no vomiting.  No diarrhea.  No constipation. Musculoskeletal: Retained foreign body to the left index finger specifically a ring that patient is unable to remove Skin: Negative for rash, abrasions, lacerations, ecchymosis. Neurological: Negative for headaches, focal weakness or numbness.  10 System ROS otherwise negative.  ____________________________________________   PHYSICAL EXAM:  VITAL SIGNS: ED Triage Vitals [02/20/21 2140]  Enc Vitals Group     BP 113/65     Pulse Rate 66     Resp 18     Temp 98.2 F (36.8 C)     Temp Source Oral     SpO2 98 %     Weight 130 lb (59 kg)     Height 5\' 6"  (1.676 m)     Head Circumference      Peak Flow      Pain Score      Pain Loc      Pain Edu?      Excl. in GC?      Constitutional: Alert and oriented. Well appearing and in no acute distress. Eyes: Conjunctivae are normal. PERRL. EOMI. Head: Atraumatic. ENT:      Ears:       Nose: No congestion/rhinnorhea.      Mouth/Throat: Mucous membranes are moist.  Neck: No stridor.    Cardiovascular: Normal rate, regular rhythm. Normal S1 and S2.  Good peripheral circulation. Respiratory: Normal respiratory effort without tachypnea or retractions. Lungs CTAB. Good air entry to the bases with no decreased or absent breath sounds. Musculoskeletal: Full range of motion to all extremities. No gross deformities appreciated.  Visualization of the index finger of the left hand reveals a silver [place] ring that is unable to be removed over the PIP joint.  Patient is macerated tissue in her attempts to remove the ring.  There is what appears to be early cellulitis along the proximal phalanx.  No abscess.  No indication of sausage digit to be concern for  infectious tenosynovitis.  Sensation and capillary refill intact to the digit. Neurologic:  Normal speech and language. No gross focal neurologic deficits are appreciated.  Skin:  Skin is warm, dry and intact. No rash noted. Psychiatric: Mood and affect are normal. Speech and behavior are normal. Patient exhibits appropriate insight and judgement.   ____________________________________________   LABS (all labs ordered are listed, but only abnormal results are displayed)  Labs Reviewed - No data to display ____________________________________________  EKG   ____________________________________________  RADIOLOGY  No results found.  ____________________________________________    PROCEDURES  Procedure(s) performed:    .Foreign Body Removal  Date/Time: 02/20/2021 11:24 PM Performed by: 02/22/2021, PA-C Authorized by: Racheal Patches, PA-C  Consent: Verbal consent obtained. Risks and benefits: risks, benefits and alternatives were discussed Consent given by: patient Patient understanding: patient states  understanding of the procedure being performed Patient identity confirmed: verbally with patient Body area: skin General location: upper extremity Location details: left index finger Tendon involvement: none Complexity: simple 1 objects recovered. Objects recovered: silver ring Post-procedure assessment: foreign body removed Comments: Patient had foreign body to the index finger of the left hand.  Patient had a ring that she was unable to remove.  Patient had caused excoriations to the skin in her attempts to remove it.  Using an electric ring cutter, ring was successfully cut through, separated.  Patient is given ring and specimen cup to keep with her.  Patient will be treated with antibiotics for soft tissue maceration/infection      Medications - No data to display   ____________________________________________   INITIAL IMPRESSION /  ASSESSMENT AND PLAN / ED COURSE  Pertinent labs & imaging results that were available during my care of the patient were reviewed by me and considered in my medical decision making (see chart for details).  Review of the [place] CSRS was performed in accordance of the [facility] prior to dispensing any controlled drugs.           Patient's diagnosis is consistent with foreign body of the left index finger.  Patient presented to the emergency department complaining of a retained ring to the left index finger.  Patient had a ring that was obviously stuck on the index fair.  Patient had macerated her tissue when attempts to try to remove the ring.  There was early cellulitis forming at this time.  No evidence of abscess.  Ring was successfully removed and the ring is left in the patient's position.  Patient be placed on antibiotic ointment and oral antibiotics for this soft tissue infection.  No indication of infectious tenosynovitis.  Patient is given return precautions.. Patient will be discharged home with prescriptions for Bactrim and Bactroban. Patient is to follow up with primary care as needed or otherwise directed. Patient is given ED precautions to return to the ED for any worsening or new symptoms.     ____________________________________________  FINAL CLINICAL IMPRESSION(S) / ED DIAGNOSES  Final diagnoses:  Foreign body of finger  Cellulitis of finger of left hand      NEW MEDICATIONS STARTED DURING THIS VISIT:  ED Discharge Orders    None          This chart was dictated using voice recognition software/Dragon. Despite best efforts to proofread, errors can occur which can change the meaning. Any change was purely unintentional.    Racheal Patches, PA-C 02/20/21 2327    Sharyn Creamer, MD 02/20/21 [phone]

## 2021-02-20 NOTE — ED Triage Notes (Signed)
Pt brought in via ems from home.  Pt has a ring on her left index finger that is stuck. Ulcer noted on finger.  Pt alert.

## 2021-02-21 NOTE — ED Notes (Signed)
Per pt, ring has gotten stuck on first finger of L hand. Pt states ring is causing infection.   Provider removed ring via ring cutter and handed back to pt via sterile cup.   Open wound noted on pt's finger where ring sat.

## 2021-03-04 ENCOUNTER — Other Ambulatory Visit: Payer: Self-pay

## 2021-03-04 ENCOUNTER — Emergency Department
Admission: EM | Admit: 2021-03-04 | Discharge: 2021-03-04 | Disposition: A | Discharge status: Home / Self Care | Payer: Medicaid Other | Attending: Emergency Medicine | Admitting: Emergency Medicine

## 2021-03-04 ENCOUNTER — Encounter: Payer: Self-pay | Admitting: Emergency Medicine

## 2021-03-04 DIAGNOSIS — S4991XA Unspecified injury of right shoulder and upper arm, initial encounter: Secondary | ICD-10-CM | POA: Diagnosis present

## 2021-03-04 DIAGNOSIS — J45909 Unspecified asthma, uncomplicated: Secondary | ICD-10-CM | POA: Diagnosis not present

## 2021-03-04 DIAGNOSIS — S51851A Open bite of right forearm, initial encounter: Secondary | ICD-10-CM | POA: Diagnosis not present

## 2021-03-04 DIAGNOSIS — Z87891 Personal history of nicotine dependence: Secondary | ICD-10-CM | POA: Diagnosis not present

## 2021-03-04 DIAGNOSIS — W503XXA Accidental bite by another person, initial encounter: Secondary | ICD-10-CM | POA: Diagnosis not present

## 2021-03-04 DIAGNOSIS — Z23 Encounter for immunization: Secondary | ICD-10-CM | POA: Insufficient documentation

## 2021-03-04 MED ORDER — TETANUS-DIPHTH-ACELL PERTUSSIS 5-2.5-18.5 LF-MCG/0.5 IM SUSY
0.5000 mL | PREFILLED_SYRINGE | Freq: Once | INTRAMUSCULAR | Status: AC
  Administered 2021-03-04: 0.5 mL via INTRAMUSCULAR
  Filled 2021-03-04: qty 0.5
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
## 2021-04-29 ENCOUNTER — Emergency Department
Admission: EM | Admit: 2021-04-29 | Discharge: 2021-04-29 | Disposition: A | Discharge status: Home / Self Care | Payer: Medicaid Other | Attending: Emergency Medicine | Admitting: Emergency Medicine

## 2021-04-29 ENCOUNTER — Emergency Department: Payer: Medicaid Other

## 2021-04-29 ENCOUNTER — Encounter: Payer: Self-pay | Admitting: Emergency Medicine

## 2021-04-29 DIAGNOSIS — L723 Sebaceous cyst: Secondary | ICD-10-CM | POA: Insufficient documentation

## 2021-04-29 DIAGNOSIS — J45909 Unspecified asthma, uncomplicated: Secondary | ICD-10-CM | POA: Insufficient documentation

## 2021-04-29 DIAGNOSIS — Z87891 Personal history of nicotine dependence: Secondary | ICD-10-CM | POA: Diagnosis not present

## 2021-04-29 DIAGNOSIS — M79672 Pain in left foot: Secondary | ICD-10-CM

## 2021-04-29 MED ORDER — MELOXICAM 7.5 MG PO TABS
15.0000 mg | ORAL_TABLET | Freq: Once | ORAL | Status: AC
  Administered 2021-04-29: 15 mg via ORAL
  Filled 2021-04-29: qty 2

## 2021-04-29 MED ORDER — MELOXICAM 15 MG PO TABS: 15.0000 mg | ORAL_TABLET | Freq: Every day | ORAL | 0 refills | Status: AC

## 2021-04-29 NOTE — ED Provider Notes (Signed)
[facility] Emergency Department Provider Note  ____________________________________________  Time seen: Approximately 10:29 PM  I have reviewed the triage vital signs and the nursing notes.   HISTORY  Chief Complaint Foot Pain    HPI Bridget Carpenter is a 30 y.o. female who presents the emergency department complaining of left foot pain.  Patient states that she is having midfoot pain with no reported injury though "I may have injured it, you never know.  "Patient states that it was painful for her to walk.  Pain began today.  No open wounds reported.  Patient is also complaining of a cyst in the lower leg.  She states that she knows that she has a dermoid cyst, however "it is never been taken care of."  No other injury or complaint currently         Past Medical History:  Diagnosis Date  . Asthma   . Depression 2009   Inpatient psych admission for SI, dissociative fugue  . Dissociative disorder or reaction 2009  . Eczema   . H/O: suicide attempt   . ODD (oppositional defiant disorder)   . PTSD (post-traumatic stress disorder)   . Schizoaffective disorder (HCC)   . Substance abuse [facility])     Patient Active Problem List   Diagnosis Date Noted  . Insomnia due to mental disorder 02/01/2021  . Adjustment disorder   . MDD (major depressive disorder), recurrent episode, severe (HCC) 08/31/2019  . Indication for care in labor and delivery, antepartum 08/29/2019  . Hydrops fetalis 08/29/2019  . Indication for care in labor or delivery 08/14/2019  . Dehydration 08/14/2019  . Rubella non-immune status, antepartum 08/01/2019  . Pain of round ligament affecting pregnancy, antepartum 08/01/2019  . Labor and delivery indication for care or intervention 07/30/2019  . Homelessness 07/25/2019  . Decreased fetal movement 07/02/2019  . Club foot, fetal, affecting care of mother, antepartum 06/29/2019  . Supervision of high risk pregnancy, antepartum  06/19/2019  . Cocaine abuse with cocaine-induced mood disorder (HCC) 04/15/2019  . Stimulant use disorder 09/11/2018  . 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/03/2018  . Bacterial vaginosis 12/25/2017  . PTSD (post-traumatic stress disorder) 09/02/2017  . MRSA carrier 07/18/2017  . Overdose 07/16/2017  . Aspiration pneumonia (HCC) 07/16/2017  . Acute respiratory failure (HCC) 07/16/2017  . Polysubstance abuse (HCC) 07/16/2017  . Eczema 05/08/2017  . Borderline personality disorder (HCC) 11/06/2016  . Cocaine use disorder, severe, dependence (HCC) 11/06/2016  . Cannabis use disorder, moderate, dependence (HCC) 11/06/2016  . Alcohol use disorder, mild, abuse 11/06/2016  . Asthma 09/23/2011  . Tobacco use disorder 09/15/2009    Past Surgical History:  Procedure Laterality Date  . ADENOIDECTOMY    . TONSILLECTOMY      Prior to Admission medications   Medication Sig Start Date End Date Taking? Authorizing Provider  meloxicam (MOBIC) 15 MG tablet Take 1 tablet (15 mg total) by mouth daily. 04/29/21  Yes Sussie Minor, Delorise Royals, PA-C  escitalopram (LEXAPRO) 20 MG tablet Take 1.5 tablets (30 mg total) by mouth daily. 02/09/21   [name], MD  mupirocin cream Idelle Jo) 2 % Apply to affected area 3 times daily 02/20/21   Davanee Klinkner, Delorise Royals, PA-C    Allergies Banana, Cantaloupe (diagnostic), Grapefruit extract, Albumin human, and Collard greens [lactuca virosa]  Family History  Adopted: Yes  Problem Relation Age of Onset  . Mental illness Mother   . Mental illness Father   . Heart disease Maternal Grandmother   . Sickle  cell trait Adoptive Mother   . Cleft palate Cousin        maternal side    Social History Social History   Tobacco Use  . Smoking status: Former Smoker    Packs/day: 0.75    Years: 2.00    Pack years: 1.50    Types: Cigarettes  . Smokeless tobacco: Never Used  . Tobacco comment: pt states she is quitting today  Vaping Use  . Vaping Use:  Never used  Substance Use Topics  . Alcohol use: Yes    Alcohol/week: 8.0 standard drinks    Types: 8 Cans of beer per week    Comment: 1 case of beer/day  . Drug use: Yes    Types: Marijuana, "Crack" cocaine, Heroin, Methylphenidate     Review of Systems  Constitutional: No fever/chills Eyes: No visual changes. No discharge ENT: No upper respiratory complaints. Cardiovascular: no chest pain. Respiratory: no cough. No SOB. Gastrointestinal: No abdominal pain.  No nausea, no vomiting.  No diarrhea.  No constipation. Musculoskeletal: Left foot pain Skin: Negative for rash, abrasions, lacerations, ecchymosis.  Cyst to the left lower leg Neurological: Negative for headaches, focal weakness or numbness.  10 System ROS otherwise negative.  ____________________________________________   PHYSICAL EXAM:  VITAL SIGNS: ED Triage Vitals  Enc Vitals Group     BP 04/29/21 1941 (!) 126/101     Pulse Rate 04/29/21 1941 95     Resp 04/29/21 1941 18     Temp 04/29/21 1941 98.4 F (36.9 C)     Temp Source 04/29/21 1941 Oral     SpO2 04/29/21 1941 100 %     Weight 04/29/21 1940 140 lb (63.5 kg)     Height 04/29/21 1940 5\' 7"  (1.702 m)     Head Circumference --      Peak Flow --      Pain Score --      Pain Loc --      Pain Edu? --      Excl. in GC? --      Constitutional: Alert and oriented. Well appearing and in no acute distress. Eyes: Conjunctivae are normal. PERRL. EOMI. Head: Atraumatic. ENT:      Ears:       Nose: No congestion/rhinnorhea.      Mouth/Throat: Mucous membranes are moist.  Neck: No stridor.    Cardiovascular: Normal rate, regular rhythm. Normal S1 and S2.  Good peripheral circulation. Respiratory: Normal respiratory effort without tachypnea or retractions. Lungs CTAB. Good air entry to the bases with no decreased or absent breath sounds. Musculoskeletal: Full range of motion to all extremities. No gross deformities appreciated.  Visualization of the left  and right foot reveals significant pes planus.  No appreciable deformity, erythema, edema or ecchymosis to the left foot.  Good range of motion.  Patient is tender over the proximal metatarsals without palpable abnormality.  Presents pedis pulse intact.  Sensation intact Neurologic:  Normal speech and language. No gross focal neurologic deficits are appreciated.  Skin:  Skin is warm, dry and intact. No rash noted.  Small epidermal cyst noted mid shin.  No overlying erythema or edema.  No tenderness. Psychiatric: Mood and affect are normal. Speech and behavior are normal. Patient exhibits appropriate insight and judgement.   ____________________________________________   LABS (all labs ordered are listed, but only abnormal results are displayed)  Labs Reviewed - No data to display ____________________________________________  EKG   ____________________________________________  RADIOLOGY I personally viewed and evaluated  these images as part of my medical decision making, as well as reviewing the written report by the radiologist.  ED Provider Interpretation: No acute findings to the left foot  DG Foot Complete Left  Result Date: 04/29/2021 CLINICAL DATA:  Midfoot pain. EXAM: LEFT FOOT - COMPLETE 3+ VIEW COMPARISON:  None. FINDINGS: There is no evidence of fracture or dislocation. There is no evidence of arthropathy or other focal bone abnormality. Soft tissues are unremarkable. IMPRESSION: Negative. Electronically Signed   By: Aram Candela M.D.   On: 04/29/2021 22:11    ____________________________________________    PROCEDURES  Procedure(s) performed:    Procedures    Medications  meloxicam (MOBIC) tablet 15 mg (15 mg Oral Given 04/29/21 2145)     ____________________________________________   INITIAL IMPRESSION / ASSESSMENT AND PLAN / ED COURSE  Pertinent labs & imaging results that were available during my care of the patient were reviewed by me and considered  in my medical decision making (see chart for details).  Review of the [place] CSRS was performed in accordance of the [facility] prior to dispensing any controlled drugs.           Patient's diagnosis is consistent with left foot pain, sebaceous cyst.  Patient presented to emergency department complaining of left foot pain and a cyst to the lower leg.  Imaging revealed no acute abnormality.  Patient is given postop shoe and anti-inflammatory for this complaint.  Patient has a epidermal cyst to the left shin with no overlying signs of infection.  Follow-up with dermatology or general surgery for excision if she so desires..  Patient is given ED precautions to return to the ED for any worsening or new symptoms.     ____________________________________________  FINAL CLINICAL IMPRESSION(S) / ED DIAGNOSES  Final diagnoses:  Foot pain, left  Sebaceous cyst      NEW MEDICATIONS STARTED DURING THIS VISIT:  ED Discharge Orders         Ordered    meloxicam (MOBIC) 15 MG tablet  Daily        04/29/21 2228              This chart was dictated using voice recognition software/Dragon. Despite best efforts to proofread, errors can occur which can change the meaning. Any change was purely unintentional.    Racheal Patches, PA-C 04/29/21 2231    Merwyn Katos, MD 04/29/21 [phone]

## 2021-04-29 NOTE — ED Triage Notes (Signed)
Pt c/o left foot pain from ankle down to toes. Pt denies injury but reports she is also experiencing athletes feet and wants that evaluated as well. Pt is calm and cooperative in triage. Pt able to answer all questions. No swelling, redness noted to area of concern.

## 2021-04-29 NOTE — ED Notes (Signed)
XR at bedside

## 2021-04-29 NOTE — ED Notes (Addendum)
Pt presents with left foot pain that started today. Denies injury. Swelling noted around left ankle. No obvious deformity. Full range of motion. Has not been taking anything for pain

## 2021-08-07 ENCOUNTER — Other Ambulatory Visit: Payer: Self-pay

## 2021-08-07 ENCOUNTER — Emergency Department
Admission: EM | Admit: 2021-08-07 | Discharge: 2021-08-07 | Discharge status: Against Medical Advice | Payer: MEDICAID | Source: Ambulatory Visit | Attending: Emergency Medicine | Admitting: Emergency Medicine

## 2021-08-07 DIAGNOSIS — Z5321 Procedure and treatment not carried out due to patient leaving prior to being seen by health care provider: Secondary | ICD-10-CM | POA: Insufficient documentation

## 2021-08-07 DIAGNOSIS — Z3491 Encounter for supervision of normal pregnancy, unspecified, first trimester: Secondary | ICD-10-CM

## 2021-08-07 LAB — CBC AND DIFFERENTIAL
Baso # K/uL: 0.1 10*3/uL (ref 0.0–0.1)
Basophil %: 0.6 %
Eos # K/uL: 0.3 10*3/uL (ref 0.0–0.4)
Eosinophil %: 2.7 %
Hematocrit: 38 % (ref 34–45)
Hemoglobin: 12.5 g/dL (ref 11.2–15.7)
IMM Granulocytes #: 0 10*3/uL (ref 0.0–0.0)
IMM Granulocytes: 0.2 %
Lymph # K/uL: 2.2 10*3/uL (ref 1.2–3.7)
Lymphocyte %: 21.8 %
MCH: 31 pg (ref 26–32)
MCHC: 33 g/dL (ref 32–36)
MCV: 95 fL (ref 79–95)
Mono # K/uL: 0.9 10*3/uL (ref 0.2–0.9)
Monocyte %: 8.6 %
Neut # K/uL: 6.8 10*3/uL — ABNORMAL HIGH (ref 1.6–6.1)
Nucl RBC # K/uL: 0 10*3/uL (ref 0.0–0.0)
Nucl RBC %: 0 /100 WBC (ref 0.0–0.2)
Platelets: 266 10*3/uL (ref 160–370)
RBC: 4 MIL/uL (ref 3.9–5.2)
RDW: 13.4 % (ref 11.7–14.4)
Seg Neut %: 66.1 %
WBC: 10.3 10*3/uL — ABNORMAL HIGH (ref 4.0–10.0)

## 2021-08-07 LAB — HOLD LAVENDER

## 2021-08-07 LAB — RUQ PANEL (ED ONLY)
ALT: 8 U/L (ref 0–35)
AST: 23 U/L (ref 0–35)
Albumin: 4.4 g/dL (ref 3.5–5.2)
Alk Phos: 54 U/L (ref 35–105)
Amylase: 97 U/L (ref 28–100)
Bili,Indirect: 0.7 mg/dL (ref 0.1–1.0)
Bilirubin,Direct: 0.3 mg/dL (ref 0.0–0.3)
Bilirubin,Total: 1 mg/dL (ref 0.0–1.2)
Lipase: 26 U/L (ref 13–60)
Total Protein: 7.2 g/dL (ref 6.3–7.7)

## 2021-08-07 LAB — BHCG, QUANT PREGNANCY: BHCG, QUANT PREGNANCY: 132529 m[IU]/mL — ABNORMAL HIGH (ref 0–1)

## 2021-08-07 LAB — BASIC METABOLIC PANEL
Anion Gap: 16 (ref 7–16)
CO2: 19 mmol/L — ABNORMAL LOW (ref 20–28)
Calcium: 9.2 mg/dL (ref 8.8–10.2)
Chloride: 104 mmol/L (ref 96–108)
Creatinine: 0.68 mg/dL (ref 0.51–0.95)
Glucose: 81 mg/dL (ref 60–99)
Lab: 14 mg/dL (ref 6–20)
Potassium: 3.8 mmol/L (ref 3.3–5.1)
Sodium: 139 mmol/L (ref 133–145)
eGFR BY CREAT: 120 *

## 2021-08-07 LAB — HOLD SST

## 2021-08-07 LAB — HOLD GREEN WITH GEL

## 2021-08-07 MED ORDER — CLASSIC PRENATAL 28-0.8 MG PO TABS *I*
1.0000 | ORAL_TABLET | Freq: Every day | ORAL | 0 refills | Status: DC
Start: 2021-08-07 — End: 2021-08-22
  Filled 2021-08-07 (×2): qty 30, 30d supply, fill #0

## 2021-08-07 NOTE — ED Procedure Documentation (Signed)
Procedures   Ultrasound - Transabdominal Pelvic  Performed by: Marvell Fuller, DO  Authorized by: Marvell Fuller, DO       Procedure Details:     Indications: pregnancy with abd / pelvic pain    Trimester: presumed 1st trimester    Assessment For: intrauterine pregnancy and fetal heart rate      Structures:       Uterus: visualized    Bladder: not visualized    Cul de sac: visualized      Findings:    Gestational sac: intrauterine      Yolk sac:   Fetal pole:   Fetal Heart activity: yes   Fetal Heart rate (bpm): 172   Crown Rump length (cm):   Estimated gestational age (weeks): 9 weeks              Pelvic Free Fluid: no      Impression:    intrauterine pregnancy           Images were interpreted by me and archived to [facility] PACS.              Marvell Fuller, DO     [place], [place], DO  08/07/21 1208

## 2021-08-07 NOTE — Discharge Instructions (Addendum)
You were seen here for evaluation of pregnancy. You had an ultrasound done which showed an intrauterine pregnancy and appropriate heart rate. You were seen by social work and given resources. You were also prescribed prenatal vitamins. A referral for a primary care physician and OB/GYN was written and it is important that you follow up for further testing and evaluation of your pregnancy. It is important that you do not smoke, drink, or use drugs as all of these can harm the pregnancy.  Return to the emergency department if you have vaginal bleeding, uncontrolled vomiting, severe abdominal pain, fever and vaginal discharge, or any further concerns.
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
## 2021-08-07 NOTE — Progress Notes (Signed)
[facility] SOCIAL WORK  PHARMACY FORM     Todays date:  August 07, 2021    Patient Name: Alexis Vang      Medical Record #: [id number]   DOB: [birth year]  Patients Address: homeless                Social Worker: Sid Falcon, MSW       Date of Service: August 07, 2021       Funding Source: SW Medication Assistance Fund  ___________________________________________________________________    Pharmacy Information:  Date/time sent: August 07, 2021     Time needed: asap    Patient Location: Outpatient    Medication Pick-up Preference: Patient will pick up at the pharmacy    Pharmacy Contact: Trudy  Social work signature: Sid Falcon, MSW  Supervisor/Manager Approval (if indicated):  Date:  (supervisor signature not required for Medicaid pending)

## 2021-08-07 NOTE — Progress Notes (Signed)
SW contacted by resident requesting SW meet with patient to discuss housing.     SW met with patient at bedside and attempted to explain emergency housing options to patient. Patient did not want to discuss options or have DHS housing checklist be completed with her information. Patient said she just wanted numbers to call, SW provided housing resource hand out, patient attempting to leave, nursing aware     Skip Mayer, MSW  ED Social Worker  Ex. [phone]

## 2021-08-08 NOTE — Progress Notes (Signed)
Received an inquiry that this patient was interested in becoming a new patient at [facility].  Patient will call back to schedule a new patient visit at [facility].

## 2021-08-09 ENCOUNTER — Encounter: Payer: Self-pay | Admitting: Student in an Organized Health Care Education/Training Program

## 2021-08-15 ENCOUNTER — Encounter: Payer: Self-pay | Admitting: Gastroenterology

## 2021-08-17 ENCOUNTER — Other Ambulatory Visit: Payer: Self-pay

## 2021-08-17 ENCOUNTER — Encounter: Payer: Self-pay | Admitting: Emergency Medicine

## 2021-08-17 ENCOUNTER — Encounter: Payer: Self-pay | Admitting: Gastroenterology

## 2021-08-17 ENCOUNTER — Inpatient Hospital Stay
Admission: EM | Admit: 2021-08-17 | Discharge: 2021-08-23 | DRG: 566 | Disposition: A | Discharge status: Home / Self Care | Payer: MEDICAID | Source: Ambulatory Visit | Attending: Psychiatry | Admitting: Psychiatry

## 2021-08-17 DIAGNOSIS — Z59 Unhousedness unspecified: Secondary | ICD-10-CM

## 2021-08-17 DIAGNOSIS — Z9142 Personal history of forced labor or sexual exploitation: Secondary | ICD-10-CM

## 2021-08-17 DIAGNOSIS — F22 Delusional disorders: Secondary | ICD-10-CM | POA: Diagnosis present

## 2021-08-17 DIAGNOSIS — O99341 Other mental disorders complicating pregnancy, first trimester: Principal | ICD-10-CM | POA: Diagnosis present

## 2021-08-17 DIAGNOSIS — B86 Scabies: Secondary | ICD-10-CM | POA: Diagnosis present

## 2021-08-17 DIAGNOSIS — Z3A11 11 weeks gestation of pregnancy: Secondary | ICD-10-CM

## 2021-08-17 DIAGNOSIS — F29 Unspecified psychosis not due to a substance or known physiological condition: Secondary | ICD-10-CM | POA: Diagnosis present

## 2021-08-17 DIAGNOSIS — F1510 Other stimulant abuse, uncomplicated: Secondary | ICD-10-CM | POA: Diagnosis present

## 2021-08-17 DIAGNOSIS — Z87891 Personal history of nicotine dependence: Secondary | ICD-10-CM

## 2021-08-17 DIAGNOSIS — O0990 Supervision of high risk pregnancy, unspecified, unspecified trimester: Secondary | ICD-10-CM

## 2021-08-17 DIAGNOSIS — X58XXXS Exposure to other specified factors, sequela: Secondary | ICD-10-CM | POA: Diagnosis present

## 2021-08-17 DIAGNOSIS — F1410 Cocaine abuse, uncomplicated: Secondary | ICD-10-CM | POA: Diagnosis present

## 2021-08-17 DIAGNOSIS — F4310 Post-traumatic stress disorder, unspecified: Secondary | ICD-10-CM | POA: Diagnosis present

## 2021-08-17 DIAGNOSIS — Z20822 Contact with and (suspected) exposure to covid-19: Secondary | ICD-10-CM | POA: Diagnosis present

## 2021-08-17 DIAGNOSIS — F1990 Other psychoactive substance use, unspecified, uncomplicated: Secondary | ICD-10-CM

## 2021-08-17 DIAGNOSIS — O99321 Drug use complicating pregnancy, first trimester: Secondary | ICD-10-CM | POA: Diagnosis present

## 2021-08-17 LAB — PLASMA PROF 7 (ED ONLY)
Anion Gap,PL: 14 (ref 7–16)
CO2,Plasma: 21 mmol/L (ref 20–28)
Chloride,Plasma: 103 mmol/L (ref 96–108)
Creatinine: 0.55 mg/dL (ref 0.51–0.95)
Glucose,Plasma: 92 mg/dL (ref 60–99)
Potassium,Plasma: 4.5 mmol/L (ref 3.3–4.6)
Sodium,Plasma: 138 mmol/L (ref 133–145)
UN,Plasma: 10 mg/dL (ref 6–20)
eGFR BY CREAT: 126 *

## 2021-08-17 LAB — CBC AND DIFFERENTIAL
Baso # K/uL: 0.1 10*3/uL (ref 0.0–0.1)
Basophil %: 0.6 %
Eos # K/uL: 0.3 10*3/uL (ref 0.0–0.4)
Eosinophil %: 3.7 %
Hematocrit: 35 % (ref 34–45)
Hemoglobin: 11.9 g/dL (ref 11.2–15.7)
IMM Granulocytes #: 0 10*3/uL (ref 0.0–0.0)
IMM Granulocytes: 0.5 %
Lymph # K/uL: 1.7 10*3/uL (ref 1.2–3.7)
Lymphocyte %: 19.7 %
MCH: 32 pg (ref 26–32)
MCHC: 34 g/dL (ref 32–36)
MCV: 94 fL (ref 79–95)
Mono # K/uL: 0.9 10*3/uL (ref 0.2–0.9)
Monocyte %: 10.6 %
Neut # K/uL: 5.5 10*3/uL (ref 1.6–6.1)
Nucl RBC # K/uL: 0 10*3/uL (ref 0.0–0.0)
Nucl RBC %: 0 /100 WBC (ref 0.0–0.2)
Platelets: 267 10*3/uL (ref 160–370)
RBC: 3.7 MIL/uL — ABNORMAL LOW (ref 3.9–5.2)
RDW: 13.4 % (ref 11.7–14.4)
Seg Neut %: 64.9 %
WBC: 8.5 10*3/uL (ref 4.0–10.0)

## 2021-08-17 LAB — RUQ PANEL (ED ONLY)
ALT: 11 U/L (ref 0–35)
AST: 27 U/L (ref 0–35)
Albumin: 4 g/dL (ref 3.5–5.2)
Alk Phos: 51 U/L (ref 35–105)
Amylase: 89 U/L (ref 28–100)
Bili,Indirect: 0.8 mg/dL (ref 0.1–1.0)
Bilirubin,Direct: 0.4 mg/dL — ABNORMAL HIGH (ref 0.0–0.3)
Bilirubin,Total: 1.2 mg/dL (ref 0.0–1.2)
Lipase: 20 U/L (ref 13–60)
Total Protein: 6.8 g/dL (ref 6.3–7.7)

## 2021-08-17 LAB — SALICYLATE LEVEL: Salicylate: <3 mg/dL — ABNORMAL LOW (ref 15.0–30.0)

## 2021-08-17 LAB — COVID-19 NAAT (PCR): COVID-19 NAAT (PCR): NEGATIVE

## 2021-08-17 LAB — PERFORMING LAB

## 2021-08-17 LAB — BHCG, QUANT PREGNANCY: BHCG, QUANT PREGNANCY: 81133 m[IU]/mL — ABNORMAL HIGH (ref 0–1)

## 2021-08-17 LAB — ACETAMINOPHEN LEVEL: Acetaminophen: <5 ug/mL

## 2021-08-17 LAB — ETHANOL: Ethanol: <10 mg/dL (ref 0–9)

## 2021-08-17 MED ORDER — PERMETHRIN 5 % EX CREA *I*
TOPICAL_CREAM | Freq: Once | CUTANEOUS | Status: AC
Start: 2021-08-17 — End: 2021-08-17
  Filled 2021-08-17: qty 60

## 2021-08-17 MED ORDER — ONDANSETRON HCL 2 MG/ML IV SOLN *I*
4.0000 mg | Freq: Once | INTRAMUSCULAR | Status: AC
Start: 2021-08-17 — End: 2021-08-17
  Administered 2021-08-17: 4 mg via INTRAVENOUS
  Filled 2021-08-17: qty 2
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
## 2021-08-17 NOTE — CPEP Notes (Signed)
Web Paged GYN on call, provided with eMRN of patient. To expect return call after they have looked over chart.

## 2021-08-17 NOTE — ED Provider Notes (Addendum)
History     Chief Complaint   Patient presents with    Psychiatric Evaluation    Abdominal Pain     30 year old female with past medical history of anxiety and homelessness presents with vomiting.  The patient had a confirmed 9-week IUP evaluated in [place] on 8/15.  At that time, she was homeless and asked for help with transporting herself to [place].  She presents to the ED today with concern for internal bleeding and that her baby is going to die.  The patient endorses intermittent emesis and increased salivation, especially in the morning.  She has had 1 episode of nonbloody emesis today.  She states she has mild, cramping abdominal pain that is diffuse and not localizable.  She denies diarrhea and vaginal bleeding.  She denies dysuria.  She additionally has scaling and itching on her bilateral hands.  The patient is not responding to external stimuli and is not hallucinating.  However, she does appear disorganized and to have paranoid delusions.  The patient denies substance use and denies SI/HI.      History provided by:  Patient        Medical/Surgical/Family History     No past medical history on file.     There is no problem list on file for this patient.           No past surgical history on file.  No family history on file.       Social History     Tobacco Use    Smoking status: Not on file    Smokeless tobacco: Not on file   Substance Use Topics    Alcohol use: Not on file    Drug use: Not on file     Living Situation     Questions Responses    Patient lives with     Homeless     Caregiver for other family member     External Services     Employment     Domestic Violence Risk                 Review of Systems   Review of Systems   Constitutional: Negative for chills and fever.   HENT: Negative for congestion, rhinorrhea and sore throat.    Eyes: Negative for visual disturbance.   Respiratory: Negative for cough and shortness of breath.    Cardiovascular: Negative for chest pain.    Gastrointestinal: Positive for abdominal pain and vomiting. Negative for constipation, diarrhea and nausea.   Endocrine: Negative for polyuria.   Genitourinary: Negative for dysuria.   Musculoskeletal: Negative for arthralgias and myalgias.   Skin: Positive for rash.   Neurological: Negative for light-headedness and headaches.   Psychiatric/Behavioral: Negative for agitation and behavioral problems.       Physical Exam     Triage Vitals  Triage Start: Start, (08/17/21 0902)   First Recorded BP: 110/70, Resp: 18, Temp: 36.5 C (97.7 F) Oxygen Therapy SpO2: 98 %, O2 Device: None (Room air), Heart Rate: 84, (08/17/21 0904)  .  First Pain Reported  0-10 Scale: 10, (08/17/21 5852)       Physical Exam  Vitals and nursing note reviewed.   Constitutional:       Comments: Awake, alert, paranoid, difficult to get a thorough history from as she is consistently distracted by her phone   HENT:      Head: Normocephalic and atraumatic.      Mouth/Throat:      Mouth:  Mucous membranes are moist.      Pharynx: Oropharynx is clear.   Eyes:      Extraocular Movements: Extraocular movements intact.   Cardiovascular:      Rate and Rhythm: Normal rate and regular rhythm.      Heart sounds: Normal heart sounds. No murmur heard.    No friction rub. No gallop.   Pulmonary:      Effort: Pulmonary effort is normal.      Breath sounds: Normal breath sounds.   Abdominal:      General: Abdomen is flat. Bowel sounds are normal. There is no distension.      Palpations: Abdomen is soft.      Tenderness: There is no abdominal tenderness.   Skin:     General: Skin is warm and dry.      Capillary Refill: Capillary refill takes less than 2 seconds.      Comments: Scaling in the intertriginous spaces between the fingers bilaterally, excoriations on her bilateral hands and forearms   Neurological:      General: No focal deficit present.      Mental Status: She is alert. She is disoriented.      Comments: The patient is having paranoid delusion that she  is bleeding internally and that her baby is going to die.         Medical Decision Making   Patient seen by me on:  08/17/2021    Assessment:  The patient presents with vomiting, rash, and paranoia.  She likely has a normal pregnancy with pregnancy associated nausea and vomiting.  However, her known anxiety seems to have decompensated to the point where she is having paranoid delusions about having internal bleeding.  She additionally likely has scabies based on the appearance of her rash on her hands.    Differential diagnosis:  Normal pregnancy, pregnancy associated nausea and vomiting, paranoid delusion, scabies    Plan:  Labs:      CBC and differential      Plasma profile 7 (Adult ED only)      RUQ panel (ED only)      COVID-19 PCR      Ethanol      Salicylate level      Acetaminophen level      BHCG, quant pregnancy      BHCG, quant pregnancy    Medications:      permethrin (ELIMITE) 5 % cream      ondansetron (ZOFRAN) injection 4 mg    Independent review of: chart/prior records    ED Course and Disposition:  The patient's labs are reassuring.  Her nausea was controlled with Zofran.  She was given permethrin cream for her scabies.  She was sent to CPEP for further psychiatric evaluation.              Ammie Ferrier, MD      Resident Attestation:    Patient seen by me on 08/17/2021.    I saw and evaluated the patient. I agree with the resident's/fellow's findings and plan of care as documented above.    Author:  Elicia Lamp, MD       Ammie Ferrier, MD  Resident  08/17/21 1250       Elicia Lamp, MD  08/20/21 [phone]

## 2021-08-17 NOTE — CPEP Notes (Signed)
Clinical Evaluator Collateral Note    Professional:   Pt reported no providers.    Personal:   Pt reported no friends or family. Chart review provided no contact information.    Deidre Ala, LMSW, 9:14 PM

## 2021-08-17 NOTE — ED Triage Notes (Signed)
Patient comes in endorsing the voices in her head are telling her she is "internally bleeding and going to die". No psych hx? Denies SI/HI.            Prehospital medications given: No
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
## 2021-08-17 NOTE — CPEP Notes (Addendum)
CPEP Triage Note    Arrival    Patient is oriented to unit and CPEP evaluation process: Yes  Reviewed cell phone and visitor policies: yes   Reviewed contraband items/milieu safety concerns and confirmed no safety concerns present: yes    Patient is accompanied by: patient alone  Patient under MHT: No    History and Chief Complaint    Reason for current presentation: Alexis Vang is a 30 year old female who came to [facility]/CPEP voluntarily d/t AH telling her she is "internally bleeding and going to die". Pphx: schizophrenia. Alexis Vang tells this Clinical research associate she is here for an ultrasound and to be put back on escitalopram. Denies SI/HI/VH. Endorses AH. Consumed ETOH this morning, reports "a lot". Not currently taking medication or engaging in mental health tx. Reports being homeless. Reports currently pregnant, LMP 3.5 months ago. Denies pain. When asked if she has any bleeding she reports "probably". Guarded and moderately cooperative during triage.     Current Mental Health Provider(s): None    Any recent exposure to infestations(lice, scabies, bedbugs, fleas) or other communicable diseases: No    Substance use: alcohol     Ingestion: Denies    Self-harm: no    Medication Data Collection    Medication data collection: Yes: With Patient (Reliable)    Physical Assessment    Pain assessment: Last Nursing documented pain:  0-10 Scale: 0 (08/17/21 1230)    Last Filed Vitals    08/17/21 0904   BP: 110/70   Pulse: 84   Resp: 18   Temp: 36.5 C (97.7 F)   SpO2: 98%       Medical / Surgical History    PMH: History reviewed. No pertinent past medical history.    PSH: History reviewed. No pertinent surgical history.    Review of Systems   Respiratory: Negative for cough and shortness of breath.    Cardiovascular: Negative for chest pain.   Gastrointestinal: Negative for abdominal pain.   Psychiatric/Behavioral: Positive for hallucinations. Negative for agitation, behavioral problems, confusion, decreased concentration, dysphoric mood,  self-injury, sleep disturbance and suicidal ideas. The patient is not hyperactive.        Any additional concerns: no    Anticipated track: 2A    Rupert Stacks, RN, 12:32 PM

## 2021-08-18 DIAGNOSIS — F22 Delusional disorders: Secondary | ICD-10-CM | POA: Diagnosis present

## 2021-08-18 DIAGNOSIS — F29 Unspecified psychosis not due to a substance or known physiological condition: Secondary | ICD-10-CM

## 2021-08-18 MED ORDER — MAGNESIUM HYDROXIDE 400 MG/5ML PO SUSP *I*
30.0000 mL | Freq: Every day | ORAL | Status: DC | PRN
Start: 2021-08-18 — End: 2021-08-23

## 2021-08-18 MED ORDER — ACETAMINOPHEN 325 MG PO TABS *I*
650.0000 mg | ORAL_TABLET | Freq: Four times a day (QID) | ORAL | Status: DC | PRN
Start: 2021-08-18 — End: 2021-08-23
  Administered 2021-08-21: 650 mg via ORAL
  Filled 2021-08-18: qty 2

## 2021-08-18 MED ORDER — ALUM & MAG HYDROXIDE-SIMETH 200-200-20 MG/5ML PO SUSP *I*
30.0000 mL | Freq: Three times a day (TID) | ORAL | Status: DC | PRN
Start: 2021-08-18 — End: 2021-08-23

## 2021-08-18 MED ORDER — ARIPIPRAZOLE 5 MG PO TABS *I*
5.0000 mg | ORAL_TABLET | Freq: Two times a day (BID) | ORAL | Status: DC
Start: 2021-08-18 — End: 2021-08-20
  Administered 2021-08-18 – 2021-08-20 (×5): 5 mg via ORAL
  Filled 2021-08-18 (×5): qty 1
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
## 2021-08-18 NOTE — CPEP Notes (Signed)
Pt refused vitals 
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
## 2021-08-18 NOTE — CPEP Notes (Signed)
Spoke with OB/GYN consult after they reviewed stated they were not concerned about pt's decrease in BHCG.

## 2021-08-18 NOTE — CPEP Notes (Signed)
Shift Note: 4a-12p  Patient appears to be resting comfortably in the milieu.  Offers no complaints.  Will continue to monitor for safety and support.

## 2021-08-18 NOTE — CPEP Notes (Signed)
Pt was able to settle shortly after 6 pm and appeared to be sleeping comfortably. Breathing easy and non-labored. NAD. Q54min safety checks and comfort measures maintained.

## 2021-08-18 NOTE — CPEP Notes (Signed)
Pt approached Clinical research associate in milieu stating "I'm having menstrual bleeding and I need an ultrasound". Writer provided a pad and underwear, pt shoved them in the couch and responded "I'm pregnant. I'm 3 months pregnant and I'm bleeding. So when you see blood coming out I'm suing y'all". RN Jon Gills notified

## 2021-08-18 NOTE — CPEP Notes (Signed)
Patient frequently approaching nurses station requesting an ultrasound and believes she is having a miscarriage. VSS. No bleeding noted on clothing or furniture.

## 2021-08-19 ENCOUNTER — Encounter: Payer: Self-pay | Admitting: Psychiatry

## 2021-08-19 ENCOUNTER — Inpatient Hospital Stay: Payer: MEDICAID

## 2021-08-19 DIAGNOSIS — Z3A11 11 weeks gestation of pregnancy: Secondary | ICD-10-CM

## 2021-08-19 DIAGNOSIS — F29 Unspecified psychosis not due to a substance or known physiological condition: Secondary | ICD-10-CM

## 2021-08-19 DIAGNOSIS — Z3687 Encounter for antenatal screening for uncertain dates: Secondary | ICD-10-CM

## 2021-08-19 DIAGNOSIS — B86 Scabies: Secondary | ICD-10-CM | POA: Diagnosis present

## 2021-08-19 DIAGNOSIS — O99341 Other mental disorders complicating pregnancy, first trimester: Principal | ICD-10-CM

## 2021-08-19 LAB — TSH: TSH: 0.59 u[IU]/mL (ref 0.27–4.20)

## 2021-08-19 LAB — URINALYSIS REFLEX TO CULTURE
Blood,UA: NEGATIVE
Glucose,UA: NEGATIVE
Ketones, UA: NEGATIVE
Leuk Esterase,UA: NEGATIVE
Nitrite,UA: NEGATIVE
Protein,UA: NEGATIVE
Specific Gravity,UA: 1.024 (ref 1.002–1.030)
pH,UA: 6.5 (ref 5.0–8.0)

## 2021-08-19 LAB — DRUG SCREEN CHEMICAL DEPENDENCY, URINE
Amphetamine,UR: NEGATIVE
Benzodiazepinen,UR: NEGATIVE
Cocaine/Metab,UR: POSITIVE
Fentanyl, UR: NEGATIVE ng/mL
Opiates,UR: NEGATIVE
THC Metabolite,UR: NEGATIVE

## 2021-08-19 LAB — CBC AND DIFFERENTIAL
Baso # K/uL: 0 10*3/uL (ref 0.0–0.1)
Basophil %: 0.2 %
Eos # K/uL: 0.3 10*3/uL (ref 0.0–0.4)
Eosinophil %: 4.7 %
Hematocrit: 34 % (ref 34–45)
Hemoglobin: 11.4 g/dL (ref 11.2–15.7)
IMM Granulocytes #: 0 10*3/uL (ref 0.0–0.0)
IMM Granulocytes: 0.5 %
Lymph # K/uL: 1.4 10*3/uL (ref 1.2–3.7)
Lymphocyte %: 20.4 %
MCH: 32 pg (ref 26–32)
MCHC: 33 g/dL (ref 32–36)
MCV: 95 fL (ref 79–95)
Mono # K/uL: 0.6 10*3/uL (ref 0.2–0.9)
Monocyte %: 9.4 %
Neut # K/uL: 4.3 10*3/uL (ref 1.6–6.1)
Nucl RBC # K/uL: 0 10*3/uL (ref 0.0–0.0)
Nucl RBC %: 0 /100 WBC (ref 0.0–0.2)
Platelets: 230 10*3/uL (ref 160–370)
RBC: 3.6 MIL/uL — ABNORMAL LOW (ref 3.9–5.2)
RDW: 12.9 % (ref 11.7–14.4)
Seg Neut %: 64.8 %
WBC: 6.6 10*3/uL (ref 4.0–10.0)

## 2021-08-19 LAB — BHCG, QUANT PREGNANCY: BHCG, QUANT PREGNANCY: 67635 m[IU]/mL — ABNORMAL HIGH (ref 0–1)

## 2021-08-19 LAB — T4, FREE: Free T4: 0.8 ng/dL — ABNORMAL LOW (ref 0.9–1.7)

## 2021-08-19 MED ORDER — FOLIC ACID 1 MG PO TABS *I*
1.0000 mg | ORAL_TABLET | Freq: Every day | ORAL | Status: DC
Start: 2021-08-19 — End: 2021-08-23
  Administered 2021-08-19 – 2021-08-23 (×5): 1 mg via ORAL
  Filled 2021-08-19 (×6): qty 1

## 2021-08-19 MED ORDER — PERMETHRIN 5 % EX CREA *I*
TOPICAL_CREAM | Freq: Once | CUTANEOUS | Status: AC
Start: 2021-08-19 — End: 2021-08-19
  Filled 2021-08-19: qty 60

## 2021-08-19 MED ORDER — PRENATAL PLUS IRON 29-1 MG PO TABS *I*
1.0000 | ORAL_TABLET | Freq: Every day | ORAL | Status: DC
Start: 2021-08-19 — End: 2021-08-23
  Administered 2021-08-19 – 2021-08-23 (×5): 1 via ORAL
  Filled 2021-08-19 (×7): qty 1

## 2021-08-19 NOTE — Plan of Care (Signed)
Patient presentation and concerns: Pt is under scabies treatment so she will have soap-water bath tomorrow as permethrine has been already applied. She is apparently cooperative and pleasant but has the potential to be aggressive/violent. Did not endorse any AH/SI/HI but sometimes seemed to respond to stimuli (laughing, talking herself). Mood euthymic but inappropriate affect. The pt is pregnant with alive fetus in utero till now. She said she came to the hospital because she needs proper care as she does not have any social security or medicaid. She seemed to be a poor historian: She said she has been a heavy drinker and drank 2 days back but does not seem to have any withdrawal; still needs to continue COWS.    Milieu engagement: Patient readily engages with staff and minimally engages with peers in the milieu.     Medications: accepted scheduled medications (permethrine cream) after insisting.     ADLs:  Patient is Independent with transferring from bed to chair.  Patient is Independent with dressing self.  Patient is Independent with bathing and toileting care.  Patient is Independent with feeding.    Gait:  . Assistive devices used: No.    Precautions: Patient maintained on suicide precautions.

## 2021-08-19 NOTE — Provider Consult (Signed)
GYNECOLOGY CONSULTATION HISTORY & PHYSICAL      Consult Requested By: Psychiatry  Consult Question: Patient reported vaginal bleeding in the context of pregnancy    HPI: Alexis Vang is a 30 y.o. female who is admitted involuntarily to psychiatry for psychosis with active auditory hallucinations, with GYN consulted for patient reported vaginal bleeding. Psychiatry staff has not observed any quantifiable amount of bleeding on clothing/pads/toilet paper.    During the ultrasound Alexis Vang was very focused on fetal heart rate, and requested multiple views and angles to assess the heart rate.  She expressed concern about the force that was used with the transducer on the abdomen, stating "I do not want my baby to be alive now but be dead by the end of the day."  She reported tenderness/was wincing intermittently throughout the ultrasound but did not express discomfort when focusing on the fetus.  When she noticed fetal movement on the ultrasound, she began expressing concern about the fetus strangulating itself with the umbilical cord. She states she wishes she was 9 months pregnant and could just deliver because the fetus is unsafe inside her uterus and would be safer outside.    Following ultrasound completion, Alexis Vang expressed relief. She states she has had no vaginal bleeding during this admission and said this "in order to get an ultrasound."  Despite what she has previously told other medical interviewers, she reports that this is her first pregnancy aside from an ectopic pregnancy that she "doesn't count." This pregnancy is unplanned and desired.  She reports currently being homeless, but having friends who she can stay with but states "sometimes you can't trust them because of witchcraft and sorcery." She expresses concern about drug use in the context of her pregnancy. She reports daily methamphetamine use, but also reports frequent use of other drugs including but not limited to cocaine, marijuana, and  opiates.    Gynecologic History   Menstrual  No LMP recorded. Unknown LMP, [redacted]w[redacted]d dated by [redacted]w[redacted]d [place] on 08/11/21    Obstetric History     OB History   No obstetric history on file.   Reports previous ectopic pregnancy    Past Medical History   History reviewed. No pertinent past medical history.    Past Surgical History   History reviewed. No pertinent surgical history.    Social History     Social History     Tobacco Use    Smoking status: Former Smoker    Smokeless tobacco: Never Used   Substance Use Topics    Alcohol use: Yes     Comment: "this morning, a lot"    Drug use: Not Currently     Types: Cocaine, Methamphetamines     Comment: patient reports daily use of methamphetamines and regular use of cocaine, marijuana, opiates       Family History     Family History     No family medical history recorded          Allergies   No Known Allergies (drug, envir, food or latex)    Current Home Medications   Medication reconciliation performed at the time of evaluation. See Med Rec portion of patient's chart.     Review of Systems   All other ROS negative unless otherwise noted in HPI.       Physical Exam     Vitals:    08/17/21 1800 08/18/21 1200 08/18/21 1202 08/19/21 1402   BP: 128/66 129/74 126/74 109/55   BP Location: Right arm Right  arm Right arm Right arm   Pulse: 75 99 95 75   Resp:    18   Temp: 36.9 C (98.4 F) 37.4 C (99.3 F)  36.6 C (97.9 F)   TempSrc: Infrared Infrared  Infrared   SpO2: 100% 100% 100% 100%   Weight:       Height:           General: in no acute distress  Mental: alert and oriented  HEENT: Normocephalic, atraumatic  Cardiovascular: Regular rate and rhythm with no murmurs  Respiratory: Clear to auscultation bilaterally, normal respiratory effort  Abdomen: soft, non-tender, non-distended, active bowel sounds, no masses  Extremities/Skin: No edema noted    Labs   All labs reviewed and notable for:  BHCG: 132,529 > 81,133 > 67, 635      Imaging   [place] 08/11/21 [place], [place]: There is a single  live intrauterine pregnancy. The crown-rump length measures approximately 3.0 cm, corresponding to an estimated gestational age of [redacted] weeks and 6 days. Good fetal movement is noted. The fetal heart rate is 163 bpm. Amniotic fluid volume is normal. The cervix is closed and 3.0 cm in length. Both ovaries appear normal. There is no adnexal mass or significant free pelvic fluid.    [place] 08/19/21 [place], [place]: IUP at [redacted]w[redacted]d by CRL with FHR 161.    Assessment & Recommendations     Alexis Vang is a 30 y.o. female who is admitted to CPEP involuntarily 2/2 psychosis and auditory hallucinations with patient reported vaginal bleeding. On bedside ultrasound today patient has viable IUP measuring [redacted]w[redacted]d by crown rump length. Given the patient's gestational age, we expect to see a normal decrease and then plateau of the bHCG levels as the placenta is taking over maintenance of the pregnancy. We recommend against trending these further during her pregnancy as it is not an effective marker of pregnancy viability at this gestational age.    Viable intrauterine pregnancy:  - Routine prenatal care  - No further bHCG trending necessary  - Recommend prenatal vitamins with folic acid   - If patient reports bleeding in the future, please quantify with pads    Please do not hesitate to page the GYN consult pager [facility] [phone]) with any further questions or concerns.    D/W Dr. Lenell Antu, MD  [facility] OBGYN Resident, PGY-1  [phone]

## 2021-08-19 NOTE — CPEP Notes (Cosign Needed)
Pt filled out menu for dinner on 8/22. Writer faxed menu to food and nutrition at 3:15pm.

## 2021-08-19 NOTE — CPEP Notes (Signed)
Writer met with patient in the milieu. Patient denied any pain. Reports feeling happy that she was able to hear the baby's heartbeat today. Denied any additional issues or concerns at this time. Q15 min safety checks or comfort measures maintained.

## 2021-08-19 NOTE — CPEP Notes (Signed)
Patient has been visible in the milieu, in good behavioral control, able to make needs known to staff. Patient has been compliant with medication and unit routine. Patient Denies SI/HI/AVH at this time. Staff will continue to monitor patient behavior this shift.
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
## 2021-08-19 NOTE — Progress Notes (Signed)
Patient arrived from CPEP at 1800 via w/c with public safety and psych tech.  Patient admitted with dx of psychosis, unspecified type, 9.39 emergency admission- legal status, suicide precautions, to the services of Dr. Lahoma Rocker.  Allergies: No known allergies.  VSS.  Cooperative with admission process.  Will monitor q64mins and PRN for safety.
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
## 2021-08-19 NOTE — CPEP Notes (Cosign Needed)
Pt requested an ultrasound for her baby when writer was obtaining vitals. Pt stated "even if I did do drugs in the past, you still have to take care of me and my child." Patient was reassured of care. Will continue to monitor.

## 2021-08-19 NOTE — H&P (Signed)
General H&P for Inpatients    Chief Complaint: [id number] Admission H&P    History of Present Illness:  The patient is a 30 y.o. female with a no significant PMH who, per chart, presents with AH and acute psychosis / paranoia. The patient appears comfortable at this time - however admits to chest pain, SOB, headache, rash, and states her baby is "dying in her stomach" due to her "infection". Low concern for infection or any acute process from labs, patient unwilling to allow a physical exam at this time. Rash on webbed spaces of fingers.    Please see CPEP notes and Psychiatry H&P for full details of admission.      History reviewed. No pertinent past medical history.  History reviewed. No pertinent surgical history.  History reviewed. No pertinent family history.  Social History     Socioeconomic History    Marital status: Single     Spouse name: Not on file    Number of children: Not on file    Years of education: Not on file    Highest education level: Not on file   Tobacco Use    Smoking status: Former Smoker    Smokeless tobacco: Never Used   Substance and Sexual Activity    Alcohol use: Yes     Comment: "this morning, a lot"    Drug use: Not Currently     Types: Cocaine, Methamphetamines     Comment: patient reports daily use of methamphetamines and regular use of cocaine, marijuana, opiates    Sexual activity: Not on file   Other Topics Concern    Not on file   Social History Narrative    Not on file       Allergies: No Known Allergies (drug, envir, food or latex)    Medications Prior to Admission   Medication Sig    prenatal multivitamin (PRENAVITE) tablet Take 1 tablet by mouth daily      Current Facility-Administered Medications   Medication Dose Route Frequency    prenatal plus iron (PRENAVITE) 29-1 MG tablet 1 tablet  1 tablet Oral Daily    folic acid (FOLVITE) tablet 1 mg  1 mg Oral Daily    acetaminophen (TYLENOL) tablet 650 mg  650 mg Oral Q6H PRN    aluminum & magnesium hydroxide  w/simethicone (MAALOX ADVANCED REGULAR) suspension 30 mL  30 mL Oral Q8H PRN    magnesium hydroxide (MILK OF MAGNESIA) 400 MG/5ML suspension 30 mL  30 mL Oral Daily PRN    ARIPiprazole (ABILIFY) tablet 5 mg  5 mg Oral BID       Review of Systems:   Review of Systems   Constitutional: Positive for fever and malaise/fatigue.   Respiratory: Positive for shortness of breath and wheezing.    Cardiovascular: Positive for chest pain.   Musculoskeletal: Positive for myalgias.   Skin: Positive for itching and rash.   Neurological: Positive for headaches.   Psychiatric/Behavioral: The patient is nervous/anxious.        Last Nursing documented pain:  0-10 Scale: 0 (08/19/21 1802)      Patient Vitals for the past 24 hrs:   BP Temp Temp src Pulse Resp SpO2 Weight   08/19/21 1802 111/70 37.2 C (99 F) TEMPORAL 84 18 100 % 65.9 kg (145 lb 3.2 oz)   08/19/21 1632 116/70 36.6 C (97.9 F) Infrared 83 12 98 %    08/19/21 1402 109/55 36.6 C (97.9 F) Infrared 75 18 100 %  Physical Exam  Constitutional:       Appearance: She is well-developed.   HENT:      Head: Normocephalic and atraumatic.   Eyes:      Extraocular Movements: Extraocular movements intact.      Conjunctiva/sclera: Conjunctivae normal.   Pulmonary:      Effort: No respiratory distress.   Abdominal:      Palpations: Abdomen is soft.   Skin:     Findings: Rash (webbed spaces bilateral hands ) present.   Neurological:      Mental Status: She is alert.   Psychiatric:         Attention and Perception: She is inattentive.         Mood and Affect: Affect is labile.         Behavior: Behavior is agitated.         Lab Results:   All labs in the last 72 hours:  Recent Results (from the past 72 hour(s))   CBC and differential    Collection Time: 08/17/21 10:00 AM   Result Value Ref Range    WBC 8.5 4.0 - 10.0 THOU/uL    RBC 3.7 (L) 3.9 - 5.2 MIL/uL    Hemoglobin 11.9 11.2 - 15.7 g/dL    Hematocrit 35 34 - 45 %    MCV 94 79 - 95 fL    MCH 32 26 - 32 pg    MCHC 34  32 - 36 g/dL    RDW 13.4 11.7 - 14.4 %    Platelets 267 160 - 370 THOU/uL    Seg Neut % 64.9 %    Lymphocyte % 19.7 %    Monocyte % 10.6 %    Eosinophil % 3.7 %    Basophil % 0.6 %    Neut # K/uL 5.5 1.6 - 6.1 THOU/uL    Lymph # K/uL 1.7 1.2 - 3.7 THOU/uL    Mono # K/uL 0.9 0.2 - 0.9 THOU/uL    Eos # K/uL 0.3 0.0 - 0.4 THOU/uL    Baso # K/uL 0.1 0.0 - 0.1 THOU/uL    Nucl RBC % 0.0 0.0 - 0.2 /100 WBC    Nucl RBC # K/uL 0.0 0.0 - 0.0 THOU/uL    IMM Granulocytes # 0.0 0.0 - 0.0 THOU/uL    IMM Granulocytes 0.5 %   Plasma profile 7 (Adult ED only)    Collection Time: 08/17/21 10:00 AM   Result Value Ref Range    Chloride,Plasma 103 96 - 108 mmol/L    CO2,Plasma 21 20 - 28 mmol/L    Potassium,Plasma 4.5 3.3 - 4.6 mmol/L    Sodium,Plasma 138 133 - 145 mmol/L    Anion Gap,PL 14 7 - 16    UN,Plasma 10 6 - 20 mg/dL    Creatinine 0.55 0.51 - 0.95 mg/dL    eGFR BY CREAT 126 *    Glucose,Plasma 92 60 - 99 mg/dL   RUQ panel (ED only)    Collection Time: 08/17/21 10:00 AM   Result Value Ref Range    Amylase 89 28 - 100 U/L    Lipase 20 13 - 60 U/L    Total Protein 6.8 6.3 - 7.7 g/dL    Albumin 4.0 3.5 - 5.2 g/dL    Bilirubin,Total 1.2 0.0 - 1.2 mg/dL    Bili,Indirect 0.8 0.1 - 1.0 mg/dL    Bilirubin,Direct 0.4 (H) 0.0 - 0.3 mg/dL    Alk Phos 51 35 - 105  U/L    AST 27 0 - 35 U/L    ALT 11 0 - 35 U/L   COVID-19 PCR    Collection Time: 08/17/21 10:00 AM   Result Value Ref Range    COVID-19 Source Nasopharyngeal     COVID-19 PCR NEGATIVE NEG   Ethanol    Collection Time: 08/17/21 10:00 AM   Result Value Ref Range    Ethanol <10 0 - 9 mg/dL   Salicylate level    Collection Time: 08/17/21 10:00 AM   Result Value Ref Range    Salicylate <3.8 (L) 75.6 - 30.0 mg/dL   Acetaminophen level    Collection Time: 08/17/21 10:00 AM   Result Value Ref Range    Acetaminophen <5 ug/mL   BHCG, quant pregnancy    Collection Time: 08/17/21 10:00 AM   Result Value Ref Range    BHCG, QUANT PREGNANCY 81,133 (H) 0 - 1 mIU/mL   Performing Lab    Collection  Time: 08/17/21 10:00 AM   Result Value Ref Range    Performing Lab see below    CBC and differential    Collection Time: 08/19/21 10:11 AM   Result Value Ref Range    WBC 6.6 4.0 - 10.0 THOU/uL    RBC 3.6 (L) 3.9 - 5.2 MIL/uL    Hemoglobin 11.4 11.2 - 15.7 g/dL    Hematocrit 34 34 - 45 %    MCV 95 79 - 95 fL    MCH 32 26 - 32 pg    MCHC 33 32 - 36 g/dL    RDW 12.9 11.7 - 14.4 %    Platelets 230 160 - 370 THOU/uL    Seg Neut % 64.8 %    Lymphocyte % 20.4 %    Monocyte % 9.4 %    Eosinophil % 4.7 %    Basophil % 0.2 %    Neut # K/uL 4.3 1.6 - 6.1 THOU/uL    Lymph # K/uL 1.4 1.2 - 3.7 THOU/uL    Mono # K/uL 0.6 0.2 - 0.9 THOU/uL    Eos # K/uL 0.3 0.0 - 0.4 THOU/uL    Baso # K/uL 0.0 0.0 - 0.1 THOU/uL    Nucl RBC % 0.0 0.0 - 0.2 /100 WBC    Nucl RBC # K/uL 0.0 0.0 - 0.0 THOU/uL    IMM Granulocytes # 0.0 0.0 - 0.0 THOU/uL    IMM Granulocytes 0.5 %   TSH    Collection Time: 08/19/21 10:11 AM   Result Value Ref Range    TSH 0.59 0.27 - 4.20 uIU/mL   T4, free    Collection Time: 08/19/21 10:11 AM   Result Value Ref Range    Free T4 0.8 (L) 0.9 - 1.7 ng/dL   BHCG, quant pregnancy    Collection Time: 08/19/21 10:11 AM   Result Value Ref Range    BHCG, QUANT PREGNANCY 67,635 (H) 0 - 1 mIU/mL   Drug screen chemical dependency, urine    Collection Time: 08/19/21 10:27 AM   Result Value Ref Range    Amphetamine,UR NEG     Cocaine/Metab,UR POS     Benzodiazepinen,UR NEG     Opiates,UR NEG     THC Metabolite,UR NEG     Fentanyl, UR NEG ng/mL    Remark,UR See Text    Urinalysis reflex to culture    Collection Time: 08/19/21 10:28 AM   Result Value Ref Range    Color, UA Yellow Yellow-Dk  Yellow    Appearance,UR Clear Clear    Specific Gravity,UA 1.024 1.002 - 1.030    Leuk Esterase,UA NEG NEGATIVE    Nitrite,UA NEG NEGATIVE    pH,UA 6.5 5.0 - 8.0    Protein,UA NEG NEGATIVE    Glucose,UA NEG NEGATIVE    Ketones, UA NEG NEGATIVE    Blood,UA NEG NEGATIVE       Radiology Impressions (last 3 days):  No results found.    Currently  Active/Followed Hospital Problems:  Active Hospital Problems    Diagnosis     Psychosis, unspecified psychosis type     Scabies      - continue permethrin cream   - Would recommend washing clothes, bed linen, patients stuffed animal      Paranoia        Assessment:   The patient is a 30 y.o. female with a no significant PMH who, per chart, presents with AH and acute psychosis / paranoia. Patient is medically stable     Plan:   - Per problem list  - Care per Psychiatry/Primary Team  - Medically stable. If decreased mobility would consider DVT Prophylaxis until ambulating at baseline.   - Routine monitoring of QTc if on antipsychotics  - Please call [facility] if patient has been on unit >30 days to consider updated medical evaluation     - Call [facility] (Pager [phone]) with any questions/concerns      Author: Adele Schilder, PA  Note created: 08/19/2021  at: 7:40 PM

## 2021-08-20 DIAGNOSIS — F19959 Other psychoactive substance use, unspecified with psychoactive substance-induced psychotic disorder, unspecified: Secondary | ICD-10-CM

## 2021-08-20 DIAGNOSIS — O0990 Supervision of high risk pregnancy, unspecified, unspecified trimester: Secondary | ICD-10-CM

## 2021-08-20 MED ORDER — PRAZOSIN HCL 1 MG PO CAPS *I*
1.0000 mg | ORAL_CAPSULE | Freq: Every evening | ORAL | Status: DC
Start: 2021-08-20 — End: 2021-08-22
  Administered 2021-08-20 – 2021-08-21 (×2): 1 mg via ORAL
  Filled 2021-08-20 (×3): qty 1

## 2021-08-20 MED ORDER — TRIAMCINOLONE ACETONIDE 0.1 % EX CREA *I*
TOPICAL_CREAM | Freq: Two times a day (BID) | CUTANEOUS | Status: DC | PRN
Start: 2021-08-20 — End: 2021-08-23
  Filled 2021-08-20: qty 15

## 2021-08-20 MED ORDER — ARIPIPRAZOLE 10 MG PO TABS *I*
10.0000 mg | ORAL_TABLET | Freq: Every evening | ORAL | Status: DC
Start: 2021-08-20 — End: 2021-08-23
  Administered 2021-08-20 – 2021-08-22 (×3): 10 mg via ORAL
  Filled 2021-08-20 (×3): qty 1

## 2021-08-20 MED ORDER — OLANZAPINE 5 MG PO TABS *I*
5.0000 mg | ORAL_TABLET | Freq: Every day | ORAL | Status: DC | PRN
Start: 2021-08-20 — End: 2021-08-23
  Administered 2021-08-22 – 2021-08-23 (×2): 5 mg via ORAL
  Filled 2021-08-20 (×2): qty 1

## 2021-08-20 MED ORDER — ESCITALOPRAM OXALATE 10 MG PO TABS *I*
10.0000 mg | ORAL_TABLET | Freq: Every day | ORAL | Status: DC
Start: 2021-08-20 — End: 2021-08-23
  Administered 2021-08-20 – 2021-08-23 (×4): 10 mg via ORAL
  Filled 2021-08-20 (×4): qty 1

## 2021-08-20 MED ORDER — HYDROCORTISONE 1 % EX CREA *I*
TOPICAL_CREAM | Freq: Two times a day (BID) | CUTANEOUS | Status: DC
Start: 2021-08-20 — End: 2021-08-22
  Filled 2021-08-20: qty 28.4

## 2021-08-20 MED ORDER — ARIPIPRAZOLE 5 MG PO TABS *I*
5.0000 mg | ORAL_TABLET | Freq: Every day | ORAL | Status: DC
Start: 2021-08-21 — End: 2021-08-23
  Administered 2021-08-21 – 2021-08-23 (×3): 5 mg via ORAL
  Filled 2021-08-20 (×3): qty 1

## 2021-08-20 NOTE — H&P (Signed)
General H&P for Inpatients  F/U ROS/PE    History reviewed. No pertinent past medical history.  History reviewed. No pertinent surgical history.  History reviewed. No pertinent family history.  Social History     Socioeconomic History    Marital status: Single     Spouse name: Not on file    Number of children: Not on file    Years of education: Not on file    Highest education level: Not on file   Tobacco Use    Smoking status: Former Smoker    Smokeless tobacco: Never Used   Substance and Sexual Activity    Alcohol use: Yes     Comment: "this morning, a lot"    Drug use: Not Currently     Types: Cocaine, Methamphetamines     Comment: patient reports daily use of methamphetamines and regular use of cocaine, marijuana, opiates    Sexual activity: Not on file   Other Topics Concern    Not on file   Social History Narrative    Not on file       Allergies: No Known Allergies (drug, envir, food or latex)    Medications Prior to Admission   Medication Sig    prenatal multivitamin (PRENAVITE) tablet Take 1 tablet by mouth daily      Current Facility-Administered Medications   Medication Dose Route Frequency    escitalopram (LEXAPRO) tablet 10 mg  10 mg Oral Daily    [START ON 08/21/2021] ARIPiprazole (ABILIFY) tablet 5 mg  5 mg Oral Daily    ARIPiprazole (ABILIFY) tablet 10 mg  10 mg Oral Nightly    triamcinolone (KENALOG) 0.1 % cream   Topical BID PRN    OLANZapine (ZyPREXA) tablet 5 mg  5 mg Oral Daily PRN    prazosin (MINIPRESS) capsule 1 mg  1 mg Oral Nightly    hydrocortisone 1 % cream   Topical 2 times per day    prenatal plus iron (PRENAVITE) 29-1 MG tablet 1 tablet  1 tablet Oral Daily    folic acid (FOLVITE) tablet 1 mg  1 mg Oral Daily    acetaminophen (TYLENOL) tablet 650 mg  650 mg Oral Q6H PRN    aluminum & magnesium hydroxide w/simethicone (MAALOX ADVANCED REGULAR) suspension 30 mL  30 mL Oral Q8H PRN    magnesium hydroxide (MILK OF MAGNESIA) 400 MG/5ML suspension 30 mL  30 mL Oral  Daily PRN       Review of Systems:   Review of Systems   Constitutional: Negative.    Respiratory: Negative for shortness of breath.    Cardiovascular: Negative for chest pain.   Gastrointestinal: Negative for abdominal pain, constipation and diarrhea.   Genitourinary: Negative for dysuria.   Skin: Positive for itching and rash.       Last Nursing documented pain:  0-10 Scale: 0 (08/20/21 1900)      Patient Vitals for the past 24 hrs:   BP Temp Temp src Pulse Resp SpO2   08/20/21 2000 120/58   82     08/20/21 0755 116/58 37.1 C (98.8 F) TEMPORAL 66 18 99 %   08/20/21 0241     18             Physical Exam  Constitutional:       Appearance: Normal appearance.   HENT:      Head: Normocephalic and atraumatic.      Mouth/Throat:      Mouth: Mucous membranes are moist.  Eyes:      Pupils: Pupils are equal, round, and reactive to light.   Cardiovascular:      Rate and Rhythm: Normal rate and regular rhythm.      Heart sounds: Normal heart sounds.   Pulmonary:      Effort: Pulmonary effort is normal.      Breath sounds: Normal breath sounds.   Abdominal:      General: Abdomen is flat.      Palpations: Abdomen is soft.   Skin:     General: Skin is warm and dry.      Findings: Rash (Hands, antecubs, abdomen ) present.   Neurological:      Mental Status: She is alert.         Lab Results:   All labs in the last 72 hours:  Recent Results (from the past 72 hour(s))   CBC and differential    Collection Time: 08/19/21 10:11 AM   Result Value Ref Range    WBC 6.6 4.0 - 10.0 THOU/uL    RBC 3.6 (L) 3.9 - 5.2 MIL/uL    Hemoglobin 11.4 11.2 - 15.7 g/dL    Hematocrit 34 34 - 45 %    MCV 95 79 - 95 fL    MCH 32 26 - 32 pg    MCHC 33 32 - 36 g/dL    RDW 78.2 95.6 - 21.3 %    Platelets 230 160 - 370 THOU/uL    Seg Neut % 64.8 %    Lymphocyte % 20.4 %    Monocyte % 9.4 %    Eosinophil % 4.7 %    Basophil % 0.2 %    Neut # K/uL 4.3 1.6 - 6.1 THOU/uL    Lymph # K/uL 1.4 1.2 - 3.7 THOU/uL    Mono # K/uL 0.6 0.2 - 0.9 THOU/uL    Eos  # K/uL 0.3 0.0 - 0.4 THOU/uL    Baso # K/uL 0.0 0.0 - 0.1 THOU/uL    Nucl RBC % 0.0 0.0 - 0.2 /100 WBC    Nucl RBC # K/uL 0.0 0.0 - 0.0 THOU/uL    IMM Granulocytes # 0.0 0.0 - 0.0 THOU/uL    IMM Granulocytes 0.5 %   TSH    Collection Time: 08/19/21 10:11 AM   Result Value Ref Range    TSH 0.59 0.27 - 4.20 uIU/mL   T4, free    Collection Time: 08/19/21 10:11 AM   Result Value Ref Range    Free T4 0.8 (L) 0.9 - 1.7 ng/dL   BHCG, quant pregnancy    Collection Time: 08/19/21 10:11 AM   Result Value Ref Range    BHCG, QUANT PREGNANCY 67,635 (H) 0 - 1 mIU/mL   Drug screen chemical dependency, urine    Collection Time: 08/19/21 10:27 AM   Result Value Ref Range    Amphetamine,UR NEG     Cocaine/Metab,UR POS     Benzodiazepinen,UR NEG     Opiates,UR NEG     THC Metabolite,UR NEG     Fentanyl, UR NEG ng/mL    Remark,UR See Text    Urinalysis reflex to culture    Collection Time: 08/19/21 10:28 AM   Result Value Ref Range    Color, UA Yellow Yellow-Dk Yellow    Appearance,UR Clear Clear    Specific Gravity,UA 1.024 1.002 - 1.030    Leuk Esterase,UA NEG NEGATIVE    Nitrite,UA NEG NEGATIVE    pH,UA 6.5 5.0 -  8.0    Protein,UA NEG NEGATIVE    Glucose,UA NEG NEGATIVE    Ketones, UA NEG NEGATIVE    Blood,UA NEG NEGATIVE       Radiology Impressions (last 3 days):  No results found.    Currently Active/Followed Hospital Problems:  Active Hospital Problems    Diagnosis     High-risk pregnancy      Seen by OB/GYN  -- F/U recs and for questions.       Scabies      With pruritis   - continue permethrin cream   -- Will try hydrocortisone cream for pruritis  - Would recommend washing clothes, bed linen, patients stuffed animal  -- Consider derm consult       Psychosis, unspecified psychosis type     Paranoia        Assessment: 30 Y/O B pregnant female in NAD, no acute medical issues identified though is C/O pruritis and was treated for scaabies.        Plan:   1) Treatment team to follow. Consult [facility] for questions or concerns.         Author: Biagio Borg, NP  Note created: 08/20/2021  at: 9:49 PM

## 2021-08-20 NOTE — Plan of Care (Signed)
Problem: Risk for Violence  Goal: The Patient Will Refrain From Harming Others Or Destroying Property Throughout Hospitalization  Outcome: Maintaining     Problem: Disturbed Thought Process and/or Sensory Perception  Goal: Patient Will Have Decreased Delusional Thinking By Accurately Interpreting Environment By Day 3  Outcome: Maintaining     Problem: Risk for Withdrawal  Goal: Patient Will Achieve A COWS Score Of 7 Or Below By Day 4  Outcome: Maintaining         Patient presentation and concerns: Alexis Vang appeared anxious early in the am but when assisted with getting clothes and books and spending time with staff she became calm and pleasant.  She remained in her room much of the day and napped on and off.  Compliant with her medications and asked appropriate questions.  Thought content tangential but she is looking for ways to calm such as books.  Someone called to say she is [name] but Alexis Vang looked confused and denied knowing who this could be.  Refusing calls and visitors.  Milieu engagement: Patient readily engages with staff and has difficulty engaging with peers in the milieu.     Medications: accepted scheduled medications    ADLs:  Patient is Independent with transferring from bed to chair.  Patient is Independent with dressing self.  Patient is Independent with bathing and toileting care.  Patient is Independent with feeding.    Gait:  . Assistive devices used: No.    Intake: Percentage of meal eaten 100% at breakfast . Fluid total intake adequate.     Precautions: Patient maintained on violence and suicide precautions

## 2021-08-20 NOTE — Plan of Care (Addendum)
Problem: Risk for Violence  Goal: The Patient Will Refrain From Harming Others Or Destroying Property Throughout Hospitalization  Outcome: Maintaining     Problem: Disturbed Thought Process and/or Sensory Perception  Goal: Patient Will Have Decreased Delusional Thinking By Accurately Interpreting Environment By Day 3  Outcome: Progressing towards goal     Problem: Risk for Withdrawal  Goal: Patient Will Achieve A COWS Score Of 7 Or Below By Day 4  Outcome: Maintaining       Nights:  Patient appeared to be asleep throughout the night. No PRN utilized. No safety concerns voiced or gestured. CIWA=0

## 2021-08-20 NOTE — Plan of Care (Signed)
Problem: Risk for Violence  Goal: The Patient Will Refrain From Harming Others Or Destroying Property Throughout Hospitalization  Outcome: Maintaining     Problem: Disturbed Thought Process and/or Sensory Perception  Goal: Patient Will Have Decreased Delusional Thinking By Accurately Interpreting Environment By Day 3  Outcome: Progressing towards goal     Problem: Risk for Withdrawal  Goal: Patient Will Achieve A COWS Score Of 7 Or Below By Day 4  Outcome: Maintaining     Patient presentation and concerns: Shane slept until 1800. Then she woke up and paced between her room and the phone for an hour and made several calls, sometimes slamming the receiver. She was calm and pleasant afterwards. Later she complained of itchy skin (scabies) and [facility] was notified. Hydrocortisone cream ordered. She remained in her room much of the shift and napped on and off. Asked about her medications and if they are good for her baby. Sometimes inappropriate affect (laughing) but usually pleasant, cooperative, and thankful to Clinical research associate. Preoccupied with her pregnancy.  Milieu engagement: Patient readily engages with staff but did not engage with peers.      Medications: accepted scheduled medications; questions about her meds.     ADLs:  Patient is Independent with transferring from bed to chair.  Patient is Independent with dressing self.  Patient is Independent with bathing and toileting care.  Patient is Independent with feeding.     Gait:  . Assistive devices used: No.     Intake: She did not like the dinner so reordered; she ate it and was happy about it . Fluid total intake adequate.      Precautions: Patient maintained on violence and suicide precautions
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
## 2021-08-21 LAB — COCAINE, URINE, CONFIRMATION: Confirm COC/METAB: POSITIVE

## 2021-08-21 LAB — COVID-19 NAAT (PCR): COVID-19 NAAT (PCR): NEGATIVE

## 2021-08-21 LAB — PERFORMING LAB

## 2021-08-21 MED ORDER — EUCERIN EX CREA *I*
TOPICAL_CREAM | CUTANEOUS | Status: DC | PRN
Start: 2021-08-21 — End: 2021-08-23
  Filled 2021-08-21: qty 454
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
## 2021-08-21 NOTE — Plan of Care (Signed)
Problem: Risk for Violence  Goal: The Patient Will Refrain From Harming Others Or Destroying Property Throughout Hospitalization  Outcome: Maintaining     Problem: Disturbed Thought Process and/or Sensory Perception  Goal: Patient Will Have Decreased Delusional Thinking By Accurately Interpreting Environment By Day 3  Outcome: Progressing towards goal     Patient presentation and concerns: Alexis Vang was more linear and logical this shift. But still some weird behavior noted: When she was asked if she had filled up the sample cup with urine, she asked for another cup though she already have one. When the writer asked her to return the previous cup, she said she would not do that unless she gets a new one. Concerned about skin conditions including dryness, scar, black pigmentation, etc. MIPS was paged and Eucerin cream was ordered. Observed to be using anything she gets for her skin including wipes.  COVID swab came negative. Tried to call several people but seemed she did not get anybody. Denies any SI/HI/AVH.     Milieu engagement: Patient readily engages with staff and minimally engages with peers in the milieu. Spent later part of the shift in the lounge watching TV/Movie.     Medications: accepted scheduled medications and given PRN of triamcinolone for itchy skin.     ADLs:  Patient is Independent with transferring from bed to chair.  Patient is Independent with dressing self.  Patient is Independent with bathing and toileting care.  Patient is Independent with feeding.     Gait: Steady . Assistive devices used: No.     Intake: good PO intake.      Precautions: Patient maintained on suicide precautions and violence precautions.

## 2021-08-21 NOTE — Progress Notes (Signed)
[id number]    Alexis Vang appeared to sleep well. Woke up ~0400 to use the BR, told staff "I always wake up at four". Fell back asleep ~0430 and has been sleeping since.

## 2021-08-21 NOTE — Plan of Care (Signed)
Problem: Disturbed Thought Process and/or Sensory Perception  Goal: Patient Will Have Decreased Delusional Thinking By Accurately Interpreting Environment By Day 3  Outcome: Goal not met     Problem: Risk for Violence  Goal: The Patient Will Refrain From Harming Others Or Destroying Property Throughout Hospitalization  Outcome: Maintaining     Problem: Risk for Withdrawal  Goal: Patient Will Achieve A COWS Score Of 7 Or Below By Day 4  Outcome: Completed or Resolved     Patient presentation and concerns: Alexis Vang was pretty disorganized and psychotic today.  She has difficulty answering questions appropriately or giving consistent answers regarding symptoms.  Was fearful of "exposing" herself to hydrocortisone cream, so wanted to wait until after showering to use it directly from the tube.  Observed to be rubbing hand sanitizer all over her body and arms, so unsure if this is what is causing the dry and itchy skin.  Was cooperative with COVID swab, which was sent, though did not tolerate NP, only did nasal.  Met with SW for a long time this afternoon.  Thoughts seem more organized as the day progressed.  Denies any SI/HI/AVH.    Milieu engagement: Patient readily engages with staff and minimally engages with peers in the milieu.     Medications: accepted scheduled medications and given PRN of Tylenol for HA    ADLs:  Patient is Independent with transferring from bed to chair.  Patient is Independent with dressing self.  Patient is Independent with bathing and toileting care.  Patient is Independent with feeding.    Gait:  . Assistive devices used: No.    Intake: good PO intake.     Precautions: Patient maintained on suicide precautions and violence precautions.

## 2021-08-22 ENCOUNTER — Other Ambulatory Visit: Payer: Self-pay

## 2021-08-22 ENCOUNTER — Telehealth: Payer: Self-pay | Admitting: Psychiatry

## 2021-08-22 ENCOUNTER — Telehealth: Payer: Self-pay

## 2021-08-22 DIAGNOSIS — F4310 Post-traumatic stress disorder, unspecified: Secondary | ICD-10-CM

## 2021-08-22 LAB — N. GONORRHOEAE NAAT (PCR): N. gonorrhoeae DNA Amplification: 0

## 2021-08-22 LAB — CHLAMYDIA NAAT (PCR): Chlamydia Plasmid DNA Amplification: 0

## 2021-08-22 LAB — TRICHOMONAS NAAT (PCR): Trichomonas DNA amplification: 0

## 2021-08-22 LAB — SYPHILIS SCREEN
Syphilis Screen: NEGATIVE
Syphilis Status: NONREACTIVE

## 2021-08-22 LAB — HEPATITIS C ANTIBODY: Hep C Ab: NEGATIVE

## 2021-08-22 LAB — HIV-1/2  ANTIGEN/ANTIBODY SCREEN WITH CONFIRMATION: HIV 1&2 ANTIGEN/ANTIBODY: NONREACTIVE

## 2021-08-22 LAB — PROLACTIN: Prolactin: 21 ng/mL

## 2021-08-22 MED ORDER — ARIPIPRAZOLE 10 MG PO TABS *I*
10.0000 mg | ORAL_TABLET | Freq: Every evening | ORAL | 0 refills | Status: AC
Start: 2021-08-22 — End: 2021-09-21
  Filled 2021-08-22: qty 30, 30d supply, fill #0

## 2021-08-22 MED ORDER — ESCITALOPRAM OXALATE 10 MG PO TABS *I*
10.0000 mg | ORAL_TABLET | Freq: Every day | ORAL | 0 refills | Status: DC
Start: 2021-08-23 — End: 2021-09-17
  Filled 2021-08-22: qty 30, 30d supply, fill #0

## 2021-08-22 MED ORDER — PRENATAL PLUS IRON 29-1 MG PO TABS *I*
1.0000 | ORAL_TABLET | Freq: Every day | ORAL | 0 refills | Status: DC
Start: 2021-08-23 — End: 2021-09-17
  Filled 2021-08-22: qty 30, 30d supply, fill #0

## 2021-08-22 MED ORDER — FOLIC ACID 1 MG PO TABS *I*
1.0000 mg | ORAL_TABLET | Freq: Every day | ORAL | 0 refills | Status: AC
Start: 2021-08-23 — End: 2021-09-22
  Filled 2021-08-22: qty 30, 30d supply, fill #0

## 2021-08-22 MED ORDER — HYDROCORTISONE 1 % EX CREA *I*
TOPICAL_CREAM | Freq: Two times a day (BID) | CUTANEOUS | 0 refills | Status: DC
Start: 2021-08-22 — End: 2021-09-17
  Filled 2021-08-22: qty 28.4, 30d supply, fill #0

## 2021-08-22 MED ORDER — PRAZOSIN HCL 2 MG PO CAPS *I*
2.0000 mg | ORAL_CAPSULE | Freq: Every evening | ORAL | Status: DC
Start: 2021-08-22 — End: 2021-08-23
  Administered 2021-08-22: 2 mg via ORAL
  Filled 2021-08-22 (×2): qty 1

## 2021-08-22 MED ORDER — ARIPIPRAZOLE 5 MG PO TABS *I*
5.0000 mg | ORAL_TABLET | Freq: Every day | ORAL | 0 refills | Status: AC
Start: 2021-08-23 — End: 2021-09-22
  Filled 2021-08-22: qty 30, 30d supply, fill #0

## 2021-08-22 MED ORDER — PRAZOSIN HCL 2 MG PO CAPS *I*
2.0000 mg | ORAL_CAPSULE | Freq: Every evening | ORAL | 0 refills | Status: DC
Start: 2021-08-22 — End: 2021-09-17
  Filled 2021-08-22: qty 30, 30d supply, fill #0

## 2021-08-22 MED ORDER — HYDROCORTISONE 1 % EX CREA *I*
TOPICAL_CREAM | Freq: Two times a day (BID) | CUTANEOUS | Status: DC
Start: 2021-08-22 — End: 2021-08-23
  Filled 2021-08-22: qty 28.4
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
## 2021-08-22 NOTE — Progress Notes (Signed)
[id number]    Alexis Vang appeared to sleep well.

## 2021-08-22 NOTE — Telephone Encounter (Signed)
Letter returned to sender, sent to scanning
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
## 2021-08-22 NOTE — Progress Notes (Signed)
[facility] SOCIAL WORK  PHARMACY FORM     Todays date:  August 22, 2021    Patient Name: Alexis Vang      Medical Record #: [id number]   DOB: July 29, 1991  Patients Address: homeless                Social Worker: Fayette Pho, LMSW       Date of Service: August 22, 2021       Funding Source: SW Medication Assistance Fund  ___________________________________________________________________    Pharmacy Information:  Date/time sent: August 22, 2021     Time needed: 6 pm    Patient Location: Inpatient (specify unit)  [phone]    Medication Pick-up Preference: Have Discharge Pharmacy Team bring to unit     Pharmacy Contact: Erin  Social work signature: Fayette Pho, LMSW  Supervisor/Manager Approval (if indicated):  Date:  (supervisor signature not required for Medicaid pending)

## 2021-08-22 NOTE — Plan of Care (Signed)
Problem: Disturbed Thought Process and/or Sensory Perception  Goal: Patient Will Have Decreased Delusional Thinking By Accurately Interpreting Environment By Day 3  Outcome: Progressing towards goal     Problem: Risk for Violence  Goal: The Patient Will Refrain From Harming Others Or Destroying Property Throughout Hospitalization  Outcome: Maintaining     Patient presentation and concerns: Alexis Vang is pleasant and cooperative today.  Remains with some odd behaviors and comments, often talking to self under her breath or in a language unable to be understood.  Keeping food stashed in her room and asking for multiple amounts of things (ie 4 body wash, 2 sandwiches, 4 milks, etc.).  Came to the desk to request her meds this AM.  Wondering about her discharge and where it will be to and when.  Had some trouble getting a ring off and finger swelling around it.  Provided with cup to soak finger in ice water and staff assisted with taking ring off, but was unable to work it off.  Call placed to [facility] and will try to get the ring cutter and bring it over later today.  Denies any SI/HI/AVH.    Milieu engagement: Patient readily engages with staff and readily engages with peers in the milieu.     Medications: accepted scheduled medications    ADLs:  Patient is Independent with transferring from bed to chair.  Patient is Independent with dressing self.  Patient is Independent with bathing and toileting care.  Patient is Independent with feeding.    Gait:  . Assistive devices used: No.    Intake: good PO intake..     Precautions: Patient maintained on suicide precautions and violence precautions.

## 2021-08-23 LAB — PROCALCITONIN: Procalcitonin: 0.03 ng/mL (ref 0.00–0.08)

## 2021-08-23 MED ORDER — TUBERCULIN PPD 5 UNIT/0.1ML ID SOLN *I*
5.0000 [IU] | Freq: Once | INTRADERMAL | Status: AC
Start: 2021-08-23 — End: 2021-08-23
  Administered 2021-08-23: 5 [IU] via INTRADERMAL

## 2021-08-23 NOTE — Continuity of Care (Signed)
[facility] Safety Plan     Step 1: Warning signs (thoughts, images, feelings, behaviors) that a crisis may be developing:  Breathing hard  Can't sit still  Hurting yourself  Not taking care of self  Racing heartbeat  Rocking  Rudeness  Swearing    Step 2: Internal coping strategies - Things I can do to take my mind off my problems without contacting another person (distracting & calming activities):  breathing exercises, crying, doing chores/special jobs, drinking a beverage, having a hug, hugging a stuffed animal, listening to music, praying/meditation, reading, snuggling in a blanket, taking a shower, taking medications, using humor, watching television and working    Step 3: People and social settings that provide distraction: People whom I can ask for help:   None listed    Step 5: Professionals or agencies I can contact during a crisis:  1.  Clinician Name: [facility]  Phone: [phone]   2.  Clinician Name: [facility]  Phone: [phone]   3.  Local Urgent Care or Emergency Room  4.       CRISIS CALL CENTER: [phone]  5.  SUICIDE PREVENTION LIFELINE PHONE: [phone]  6.  LIFELINE OR MOBILE CRISIS: [phone] or [phone]  7.  POLICE: 911    Step 6: Making the environment safe (removing or limiting access to lethal means):  Taking medication as prescribed    The one thing that is most important to me and worth living for is:  My unborn infant baby. [redacted] weeks pregnant now.    Patient Signature: ______________________________________________________    Clinician Signature: _____________________________________________________       Elberta Fortis (2012)

## 2021-08-23 NOTE — Progress Notes (Signed)
TB placed in right forearm @ 1415. Read 48hrs from 1415
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
## 2021-08-23 NOTE — Progress Notes (Signed)
2330-0730    Alexis Vang appeared to sleep well.

## 2021-08-23 NOTE — Consults (Signed)
Breastfeeding Medicine Brief Note    Noted provider consult. Unfortunately due to staffing we have no inpt provider today. Spoke with LC who will call unit to let RN know. Breast changes are often the first sign of pregnancy and supportive care includes supportive bra, pain relief as needed. Given patient's history will recommend outpatient prenatal breastfeeding consult visit to discuss breastfeeding in the context of her medical history and substance use. OBGYN follow up is recommend and we can work closely with them.     Thank you for this consult,    Lenon Ahmadi, MD, MPH, MSEd, [facility]   [facility] Medicine Breastfeeding, clinic number: [phone]

## 2021-08-23 NOTE — Lactation Note (Signed)
Lactation Consultant Daily Visit   Patient: Alexis Vang " "        Age: 30 y.o.              Corrected GA: blank  MRN: [id number]        Interventions and Instructions   Spoke with Unit Chg RN: Ronaldo Miyamoto. Shared that a Provider from Breast feeding Medicine would not be able to see [name] today. Shared number for the Breast feeding Clinic: [phone]. Encouraged having an appointment made for Mother to answer her questions.        Donalda Ewings, RN  Lactation Consultant

## 2021-08-23 NOTE — Discharge Instructions (Signed)
Psychiatrist: Dustin Flock, MD  NP/PA: Mertha Baars, NP  Social Worker:   Lyndle Herrlich, SW and Derinda Late, LMSW     Discharge Destination: Bonita Quin are being discharged to [facility] located at [address] in the [phone] and [employer] will provide you transportation to [facility].     Brief Summary of Your Hospital Course (including key procedures and diagnostic test results):  You were admitted because there was concern for your safety. You were prescribed prazosin, lexapro, and abilify which you tolerated well. Your mood improved, you slept and ate well, and you no longer have thoughts of hurting yourself or others and you no longer are having trouble with your thoughts. Your treatment team strongly recommends you remain abstinent from alcohol and drugs as these may be adding to your psychiatric symptoms. You are being discharged home and are to follow up with outpatient mental health and medical providers.    You had a PPD placed on 08/23/2021. You will have your PPD read at inpatient CD facility.     Information regarding Supportive Referrals and Follow-up care:      [facility]: You were referred and accepted to [facility] for treatment     PCP appointment: You have an intake appointment on Thursday, September 04, 2021 at 3:00pm. [facility].    [facility]: You have a follow-up appointment on Monday, October 14, 2021 at 3:00pm and [facility] will [facility] transportation there.     Breastfeeding clinic: please call [phone] to make an appointment    Safety Plan: Please review your safety plan when feeling anxious, unsafe, overwhelmed, or depressed.     72 Hour Follow-Up Call: You will be receiving a follow-up call from [facility], BSW  72 hours after discharge to assist in transition from your admission in the hospital. If any concerns arise prior to Jody's call please contact her at [phone]. When Jody contacts  you the number will appear as [phone].    Recommendations: Diet: Regular - No restrictions  Activity: activity as tolerated     The clinical team recommends the following actions for creating a safe environment post discharge: take medications as prescribed     You are being discharged with: Medications filled at [facility]    When to call for help if experiencing any troubling symptoms including thoughts to harm yourself or others or with any other questions about your discharge plan or medications please call:     Lifeline/Mobile Crisis Team: (24/7) [phone], [phone]) [phone] (Out of [place]) [phone]     Level of Outreach Indicated If Patient Fails to Attend Scheduled Mental Health Appointment: mobile crisis team recommended    Was the patient provided a printed copy of their After Visit Summary:   Yes
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
## 2021-08-23 NOTE — Progress Notes (Addendum)
Psychiatric Daily Progress Note    I saw and examined this patient in the presence of SW today. I reviewed nurses' notes as well as all other notes, lab tests and reports made during the last 24 hours. I discussed the case with members of the treatment team, including the attending Dr. Lahoma Rocker.    HPI: (Comment on the development of the patient's present illness from the previous encounter) (Level 1 &2: 1 - 3 Elements, Level 3: 4 Elements)    Pt is polite, cooperative, linear, and logical on interaction. She has good range of affect and is laughing/smiling at times. 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 I want to get my GED." Team reinforced her positive goals and explained timelines on certain aspects. She reports AH; she states "it's [employer] telling me bad or talking to me about things that don't even matter... [employer] was the one who told me to put on the wig, I don't even like this wig." Per pt, these voices are consistent throughout the day. Team discussed with pt potential housing/placement options for discharge. Pt is in agreement with inpt CD. Team's CASAC worker, Educational psychologist, will see pt today. Pt is goal-directed in many positive aspects, for instance, going to school, getting a job, getting her proper legal documentation etc. However, she is also fearful of potential hypothetical situations in her journey to do those things that would lead to her being found and either killed or kidnapped. Pt is medication compliant.    Review of Systems (Level 2: 1 System, Level 3:  2-9 Systems):    Review of Systems   Constitutional: Negative for activity change and appetite change.   HENT: Negative for drooling and voice change.    Eyes: Negative for itching.   Respiratory: Negative for choking and wheezing.    Gastrointestinal: Negative for  vomiting.   Musculoskeletal: Negative for gait problem.   Allergic/Immunologic: Negative for food allergies.   Neurological: Negative for facial asymmetry and speech difficulty.       Mental Status Exam - Psych. Exam (Level 2&3: 2-7 Elements of the MSE):    PSYCH EXAM:     Constitutional  Appearance:  Dressed appropriately and well-groomed  Vital Signs: BP 92/54 (BP Location: Left arm)    Pulse 70    Temp 37.3 C (99.1 F) (Temporal)    Resp 16    Ht 1.676 m (5\' 6" )    Wt 65.9 kg (145 lb 3.2 oz)    SpO2 100%    BMI 23.44 kg/m     Musculoskeletal  Station/ Gait:  Normal  Muscle Strength:  Normal  Muscle Tone:  Normal    Psychiatric  Psychomotor Activity:  Normal  Abnormal Movements:  None  Relationship to Interviewer:  Cooperative, engages well, eye contact good and friendly  Speech:  Normal  Language:  Fluent  Mood:  Euthymic  Affect:  Appropriate  Thought Process:  Linear/ goal directed  Thought Content:  No suicidal ideation and no homicidal ideation  Cognition:  Fair attention span  Perceptions/ Associations:  Auditory  Insight:  Improving  Judgement:  Fair  Sensorium:  Alert  Fund of Knowledge:  Normal         Physical Exam (if applicable):    Physical Exam  Vitals and nursing  note reviewed.   Constitutional:       General: She is not in acute distress.     Appearance: Normal appearance. She is normal weight. She is not ill-appearing, toxic-appearing or diaphoretic.   Neurological:      Mental Status: She is alert and oriented to person, place, and time.      Gait: Gait normal.         Scheduled Meds:   prazosin  2 mg Oral Nightly    hydrocortisone   Topical 2 times per day    escitalopram  10 mg Oral Daily    ARIPiprazole  5 mg Oral Daily    ARIPiprazole  10 mg Oral Nightly    prenatal plus iron  1 tablet Oral Daily    folic acid  1 mg Oral Daily     Continuous Infusions:  PRN Meds:.eucerin, triamcinolone, OLANZapine, acetaminophen, aluminum & magnesium hydroxide w/simethicone, magnesium hydroxide          Reason(s) for continued stay: Stabilization of gains and Need for safe discharge    Labs   All labs in the last 24 hours: No results found for this or any previous visit (from the past 24 hour(s)).    Diagnostic Impression  Principal Problem:    PTSD (post-traumatic stress disorder)  Active Problems:    Scabies    High-risk pregnancy    Paranoia    Psychosis, unspecified psychosis type      Assessment/Plan:  - Pt agreed to inpt CD treatment as a discharge plan   - Anticipated discharge today to inpt CD  - No medication changes today  - Cancel pt's current PCP appt as she will be in inpt CD (roughly a 3 month program); put contact info for PCP in discharge paperwork so inpt CD can make appt when pt is ready for discharge  - Monitor per care plan    Greater than 50 % of time with patient was spent with counseling / coordination of care. Time spent: n/a  Author: Roena Malady, NP  as of: 08/23/2021  at: 1:01 PM
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
## 2021-08-27 ENCOUNTER — Ambulatory Visit: Payer: Self-pay

## 2021-08-27 ENCOUNTER — Ambulatory Visit: Payer: Self-pay | Admitting: Psychiatry

## 2021-08-27 ENCOUNTER — Other Ambulatory Visit: Payer: Self-pay

## 2021-09-04 ENCOUNTER — Ambulatory Visit: Payer: Self-pay | Admitting: Nurse Practitioner

## 2021-09-05 DIAGNOSIS — F22 Delusional disorders: Secondary | ICD-10-CM

## 2021-09-05 DIAGNOSIS — T7421XA Adult sexual abuse, confirmed, initial encounter: Secondary | ICD-10-CM

## 2021-09-05 DIAGNOSIS — O9A212 Injury, poisoning and certain other consequences of external causes complicating pregnancy, second trimester: Secondary | ICD-10-CM

## 2021-09-05 DIAGNOSIS — F419 Anxiety disorder, unspecified: Secondary | ICD-10-CM

## 2021-09-05 DIAGNOSIS — Z87891 Personal history of nicotine dependence: Secondary | ICD-10-CM | POA: Insufficient documentation

## 2021-09-05 DIAGNOSIS — N898 Other specified noninflammatory disorders of vagina: Secondary | ICD-10-CM | POA: Insufficient documentation

## 2021-09-05 DIAGNOSIS — O26892 Other specified pregnancy related conditions, second trimester: Secondary | ICD-10-CM | POA: Insufficient documentation

## 2021-09-05 DIAGNOSIS — R102 Pelvic and perineal pain: Secondary | ICD-10-CM

## 2021-09-05 DIAGNOSIS — Z3A15 15 weeks gestation of pregnancy: Secondary | ICD-10-CM | POA: Insufficient documentation
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
## 2021-09-06 ENCOUNTER — Observation Stay
Admission: AD | Admit: 2021-09-06 | Discharge: 2021-09-06 | Disposition: A | Discharge status: Home / Self Care | Payer: MEDICAID | Source: Ambulatory Visit | Attending: Emergency Medicine | Admitting: Emergency Medicine

## 2021-09-06 ENCOUNTER — Emergency Department
Admission: EM | Admit: 2021-09-06 | Discharge: 2021-09-06 | Disposition: A | Discharge status: Home / Self Care | Payer: MEDICAID | Source: Ambulatory Visit | Attending: Emergency Medicine | Admitting: Emergency Medicine

## 2021-09-06 ENCOUNTER — Other Ambulatory Visit: Payer: Self-pay

## 2021-09-06 ENCOUNTER — Encounter: Payer: Self-pay | Admitting: Obstetrics & Gynecology

## 2021-09-06 DIAGNOSIS — T7421XA Adult sexual abuse, confirmed, initial encounter: Secondary | ICD-10-CM

## 2021-09-06 DIAGNOSIS — Y9389 Activity, other specified: Secondary | ICD-10-CM | POA: Insufficient documentation

## 2021-09-06 DIAGNOSIS — X58XXXA Exposure to other specified factors, initial encounter: Secondary | ICD-10-CM | POA: Insufficient documentation

## 2021-09-06 DIAGNOSIS — O9A412 Sexual abuse complicating pregnancy, second trimester: Principal | ICD-10-CM | POA: Insufficient documentation

## 2021-09-06 DIAGNOSIS — Z113 Encounter for screening for infections with a predominantly sexual mode of transmission: Secondary | ICD-10-CM | POA: Insufficient documentation

## 2021-09-06 DIAGNOSIS — Y9289 Other specified places as the place of occurrence of the external cause: Secondary | ICD-10-CM | POA: Insufficient documentation

## 2021-09-06 DIAGNOSIS — O9A212 Injury, poisoning and certain other consequences of external causes complicating pregnancy, second trimester: Secondary | ICD-10-CM

## 2021-09-06 DIAGNOSIS — Z3A14 14 weeks gestation of pregnancy: Secondary | ICD-10-CM | POA: Insufficient documentation

## 2021-09-06 DIAGNOSIS — Y998 Other external cause status: Secondary | ICD-10-CM | POA: Insufficient documentation

## 2021-09-06 LAB — MEDICALLY URGENT HIV-1/2 ANTIBODY SCREEN WITH CONFIRMATION: Rapid HIV 1&2: NEGATIVE

## 2021-09-06 LAB — VAGINITIS SCREEN: DNA PROBE: Vaginitis Screen:DNA Probe: 0

## 2021-09-06 LAB — CHLAMYDIA NAAT (PCR): Chlamydia Plasmid DNA Amplification: 0

## 2021-09-06 LAB — HEPATITIS C ANTIBODY: Hep C Ab: NEGATIVE

## 2021-09-06 LAB — TRICHOMONAS NAAT (PCR): Trichomonas DNA amplification: POSITIVE — AB

## 2021-09-06 LAB — N. GONORRHOEAE NAAT (PCR): N. gonorrhoeae DNA Amplification: 0
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
## 2021-09-06 NOTE — Discharge Instructions (Signed)
Go right to [facility] triage located here at [facility]. They are expecting your arrival for further evaluation and management.

## 2021-09-06 NOTE — Discharge Instructions (Signed)
Will call with test results. Next appointment is 9/12. Please return to triage if you have any cramping, vaginal bleeding, or leakage of fluid.
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
## 2021-09-06 NOTE — Progress Notes (Signed)
ED SW responded to OVF-21 to meet with the Patient. The Patient is no longer present in the ED and SW discovered that the Patient has been discharged.     Benedetto Coons, LMSW   Emergency Department Social Work  [phone]  Page ID [phone]

## 2021-09-07 LAB — HIV-1/2  ANTIGEN/ANTIBODY SCREEN WITH CONFIRMATION: HIV 1&2 ANTIGEN/ANTIBODY: NONREACTIVE

## 2021-09-09 ENCOUNTER — Other Ambulatory Visit: Payer: Self-pay

## 2021-09-09 ENCOUNTER — Observation Stay
Admission: AD | Admit: 2021-09-09 | Discharge: 2021-09-10 | Disposition: A | Discharge status: Home / Self Care | Payer: MEDICAID | Source: Ambulatory Visit | Attending: Obstetrics and Gynecology | Admitting: Obstetrics and Gynecology

## 2021-09-09 ENCOUNTER — Ambulatory Visit: Payer: MEDICAID

## 2021-09-09 ENCOUNTER — Emergency Department
Admission: EM | Admit: 2021-09-09 | Discharge: 2021-09-09 | Discharge status: Against Medical Advice | Payer: Self-pay | Source: Ambulatory Visit

## 2021-09-09 ENCOUNTER — Telehealth: Payer: Self-pay

## 2021-09-09 DIAGNOSIS — O98312 Other infections with a predominantly sexual mode of transmission complicating pregnancy, second trimester: Secondary | ICD-10-CM | POA: Insufficient documentation

## 2021-09-09 DIAGNOSIS — O26852 Spotting complicating pregnancy, second trimester: Principal | ICD-10-CM | POA: Insufficient documentation

## 2021-09-09 DIAGNOSIS — Z7251 High risk heterosexual behavior: Secondary | ICD-10-CM | POA: Insufficient documentation

## 2021-09-09 DIAGNOSIS — Z5989 Other problems related to housing and economic circumstances: Secondary | ICD-10-CM | POA: Insufficient documentation

## 2021-09-09 DIAGNOSIS — Z5941 Food insecurity: Secondary | ICD-10-CM | POA: Insufficient documentation

## 2021-09-09 DIAGNOSIS — Z3A14 14 weeks gestation of pregnancy: Secondary | ICD-10-CM | POA: Insufficient documentation

## 2021-09-09 DIAGNOSIS — R300 Dysuria: Secondary | ICD-10-CM | POA: Insufficient documentation

## 2021-09-09 DIAGNOSIS — A599 Trichomoniasis, unspecified: Secondary | ICD-10-CM | POA: Insufficient documentation

## 2021-09-09 DIAGNOSIS — O0992 Supervision of high risk pregnancy, unspecified, second trimester: Secondary | ICD-10-CM

## 2021-09-09 LAB — URINALYSIS WITH MICROSCOPIC
Bacteria,UA: NONE SEEN
Hyaline Casts,UA: NONE SEEN /lpf (ref 0–5)
Ketones, UA: NEGATIVE
Nitrite,UA: NEGATIVE
Specific Gravity,UA: 1.029 (ref 1.002–1.030)
WBC,UA: >50 /hpf — AB (ref 0–5)
pH,UA: 6 (ref 5.0–8.0)

## 2021-09-09 MED ORDER — ONDANSETRON 4 MG PO TBDP *I*
4.0000 mg | ORAL_TABLET | Freq: Once | ORAL | Status: AC
Start: 2021-09-09 — End: 2021-09-09
  Administered 2021-09-09: 4 mg via ORAL
  Filled 2021-09-09: qty 1

## 2021-09-09 MED ORDER — NITROFURANTOIN MONOHYD MACRO 100 MG PO CAPS *I*
100.0000 mg | ORAL_CAPSULE | Freq: Two times a day (BID) | ORAL | 0 refills | Status: DC
Start: 2021-09-09 — End: 2021-09-09
  Filled 2021-09-09: qty 14, 7d supply, fill #0

## 2021-09-09 MED ORDER — METRONIDAZOLE 500 MG PO TABS *I*
2000.0000 mg | ORAL_TABLET | Freq: Once | ORAL | Status: AC
Start: 2021-09-09 — End: 2021-09-09
  Administered 2021-09-09: 2000 mg via ORAL
  Filled 2021-09-09: qty 4

## 2021-09-09 NOTE — Progress Notes (Signed)
SW contacted and asked to provide the pt with homeless resources, this writer tubed the after hours information up to the unit and explained which number the pt would need to call. This Clinical research associate confirmed that [employer] is full for tonight. This Clinical research associate was then asked to voucher a cab for the pt as the pt doesn't have Medicaid. This Clinical research associate explained that unfortunately both cab companies that SW can voucher with are closed for the night, this writer tubed up four parking passes.     Perrin Smack, LMSW  Emergency Department Social Worker  [phone]

## 2021-09-09 NOTE — Progress Notes (Signed)
Previous admit date: 08/19/2021    Follow-Up Call    Psych Follow Up Call  Warm Handoff: Yes  Follow-Up Call: 2-Week Call  Phone Call Made: Unable to Contact  Comments: VM is full. Unable to leave message.          Next Steps: Unable to contact patient.      Zharia Conrow,BSW [phone]
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
## 2021-09-09 NOTE — Telephone Encounter (Signed)
See note in NPI encounter.

## 2021-09-09 NOTE — Progress Notes (Signed)
Writer reviewed chart for care coordination.  ACC will continue to follow and coordinate discharge planning needs.   Please contact me with any questions or concerns. Thank you!     Liberta Gimpel, RN BSN  Acute Care Coordinator [address] High Risk OB and [address] High Risk Labor/Delivery  [phone]    Pager [phone]  Weekend coverage pager [phone]
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
## 2021-09-09 NOTE — Discharge Instructions (Signed)
Reason for Triage Visit: Observation / Evaulation  Destination: Home  Mode: Ambulatory    Procedures done in triage: Fetal monitoring, Medications and Ultrasound  Pending Laboratory Data: None    Diagnosis: UTI, Trichomonas     Diet: Regular    Activity: Regular activity    Special Instructions: Call or return to triage if you have worsening of any of your symptoms, if you have more than 6 contractions in an hour, if you have vaginal bleeding or leakage of clear fluid, if you have headache or vision changes, if you have right-sided pain, or if you don't feel the baby moving.    If you cannot reach your OB/Gyn or Midwife, call the Labor and Delivery Unit or 911 if it is an emergency.    Follow-up with your [facility] provider as previously instructed.
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
## 2021-09-10 LAB — CHLAMYDIA NAAT (PCR): Chlamydia Plasmid DNA Amplification: 0

## 2021-09-10 LAB — N. GONORRHOEAE NAAT (PCR): N. gonorrhoeae DNA Amplification: 0

## 2021-09-10 LAB — AEROBIC CULTURE: Aerobic Culture: 0

## 2021-09-10 LAB — TRICHOMONAS NAAT (PCR): Trichomonas DNA amplification: 0
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
## 2021-09-10 NOTE — ED Procedure Documentation (Addendum)
Procedures   Ultrasound - Transabdominal Pelvic  Performed by: Cecile Sheerer, DO  Authorized by: Cecile Sheerer, DO       Procedure Details:     Indications: pregnancy with abd / pelvic pain    Trimester: presumed 1st trimester    Assessment For: intrauterine pregnancy        Findings:     Yolk sac:   Fetal pole:   Fetal Heart activity: yes          Impression:    intrauterine pregnancy           Images were interpreted by me and archived to [facility] PACS.              Nia Garnette Czech, DO     Cherlynn Kaiser [place], [place]  Resident  09/10/21 1232    Ultrasound Procedure: I supervised the procedure, and was immediately available during the procedure.    Images: Images were reviewed and interpreted by me and I agree with the resident interpretation as documented.    Verlin Grills, MD as of 09/10/2021 at 8:21 PM         Verlin Grills, MD  09/10/21 2022

## 2021-09-10 NOTE — Progress Notes (Signed)
Discharge paperwork reviewed with pt and all questions answered. Pt states understanding. Pt ambulatory off unit for safe discharge home at this time.    Rilley Poulter, RN

## 2021-09-13 ENCOUNTER — Ambulatory Visit: Payer: Self-pay

## 2021-09-13 ENCOUNTER — Telehealth: Payer: Self-pay

## 2021-09-13 NOTE — Telephone Encounter (Signed)
Writer attempted to call pt at 1331 for her phone NPI.  Phone immediately went to voice mail.  Writer unable to leave VMM d/t mailbox full.  Writer attempted to reach pt again at 1351.  Pt answered after 4 rings, but then phone disconnected.  ? Pt's phone not charged.  Will route to N. Ronayne to reschedule NPI.  May need to do in person NPI at [facility] on 9/20.

## 2021-09-16 ENCOUNTER — Telehealth: Payer: Self-pay

## 2021-09-16 NOTE — Telephone Encounter (Signed)
Writer attempted to call pt to complete NPI.  Phone went directly to voice mail again and unable to leave VM d/t mailbox is full.  Pt is scheduled for NOB tomorrow, 9/20.  Will route to [facility] labs pool and Dr. Onnie Boer.

## 2021-09-16 NOTE — Telephone Encounter (Signed)
Error

## 2021-09-17 ENCOUNTER — Ambulatory Visit: Payer: MEDICAID | Attending: Obstetrics and Gynecology | Admitting: Obstetrics and Gynecology

## 2021-09-17 ENCOUNTER — Encounter: Payer: Self-pay | Admitting: Obstetrics and Gynecology

## 2021-09-17 ENCOUNTER — Ambulatory Visit: Payer: MEDICAID

## 2021-09-17 ENCOUNTER — Other Ambulatory Visit: Payer: Self-pay

## 2021-09-17 VITALS — BP 136/68 | HR 86 | Ht 67.0 in | Wt 154.6 lb

## 2021-09-17 DIAGNOSIS — O34219 Maternal care for unspecified type scar from previous cesarean delivery: Secondary | ICD-10-CM | POA: Insufficient documentation

## 2021-09-17 DIAGNOSIS — O0992 Supervision of high risk pregnancy, unspecified, second trimester: Secondary | ICD-10-CM | POA: Insufficient documentation

## 2021-09-17 DIAGNOSIS — F29 Unspecified psychosis not due to a substance or known physiological condition: Secondary | ICD-10-CM | POA: Insufficient documentation

## 2021-09-17 DIAGNOSIS — Z8632 Personal history of gestational diabetes: Secondary | ICD-10-CM | POA: Insufficient documentation

## 2021-09-17 DIAGNOSIS — O09292 Supervision of pregnancy with other poor reproductive or obstetric history, second trimester: Secondary | ICD-10-CM

## 2021-09-17 DIAGNOSIS — Z20828 Contact with and (suspected) exposure to other viral communicable diseases: Secondary | ICD-10-CM | POA: Insufficient documentation

## 2021-09-17 DIAGNOSIS — O09299 Supervision of pregnancy with other poor reproductive or obstetric history, unspecified trimester: Secondary | ICD-10-CM | POA: Insufficient documentation

## 2021-09-17 DIAGNOSIS — J45909 Unspecified asthma, uncomplicated: Secondary | ICD-10-CM | POA: Insufficient documentation

## 2021-09-17 DIAGNOSIS — Z59 Unhousedness unspecified: Secondary | ICD-10-CM | POA: Insufficient documentation

## 2021-09-17 DIAGNOSIS — O99512 Diseases of the respiratory system complicating pregnancy, second trimester: Secondary | ICD-10-CM

## 2021-09-17 DIAGNOSIS — Z3A15 15 weeks gestation of pregnancy: Secondary | ICD-10-CM | POA: Insufficient documentation

## 2021-09-17 DIAGNOSIS — F1990 Other psychoactive substance use, unspecified, uncomplicated: Secondary | ICD-10-CM | POA: Insufficient documentation

## 2021-09-17 DIAGNOSIS — B86 Scabies: Secondary | ICD-10-CM | POA: Insufficient documentation

## 2021-09-17 DIAGNOSIS — O99519 Diseases of the respiratory system complicating pregnancy, unspecified trimester: Secondary | ICD-10-CM | POA: Insufficient documentation

## 2021-09-17 DIAGNOSIS — Z20822 Contact with and (suspected) exposure to covid-19: Secondary | ICD-10-CM | POA: Insufficient documentation

## 2021-09-17 LAB — POCT 7 URINALYSIS DIPSTICK
Blood,UA POCT: NEGATIVE
Glucose,UA POCT: NORMAL mg/dL
Ketones,UA POCT: NEGATIVE mg/dL
Leuk Esterase,UA POCT: 1 — AB
Lot #: 57361207
Nitrite,UA POCT: NEGATIVE
PH,UA POCT: 5 (ref 5–8)

## 2021-09-17 LAB — COVID-19 NAAT (PCR): COVID-19 NAAT (PCR): NEGATIVE

## 2021-09-17 MED ORDER — HYDROCORTISONE 1 % EX CREA *I*
TOPICAL_CREAM | Freq: Two times a day (BID) | CUTANEOUS | 0 refills | Status: AC
Start: 2021-09-17 — End: 2021-10-17
  Filled 2021-09-17: qty 28.4, 30d supply, fill #0

## 2021-09-17 MED ORDER — ESCITALOPRAM OXALATE 10 MG PO TABS *I*
10.0000 mg | ORAL_TABLET | Freq: Every day | ORAL | 3 refills | Status: DC
Start: 2021-09-17 — End: 2021-11-20
  Filled 2021-09-17: qty 30, 30d supply, fill #0

## 2021-09-17 MED ORDER — PERMETHRIN 5 % EX CREA *I*
TOPICAL_CREAM | Freq: Once | CUTANEOUS | 1 refills | Status: AC
Start: 2021-09-17 — End: 2021-09-17
  Filled 2021-09-17: qty 60, 1d supply, fill #0

## 2021-09-17 MED ORDER — ASPIRIN 81 MG PO TBEC *I*
162.0000 mg | DELAYED_RELEASE_TABLET | Freq: Every day | ORAL | 3 refills | Status: DC
Start: 2021-09-17 — End: 2022-01-21
  Filled 2021-09-17: qty 60, 30d supply, fill #0

## 2021-09-17 MED ORDER — PRENATAL PLUS IRON 29-1 MG PO TABS *I*
1.0000 | ORAL_TABLET | Freq: Every day | ORAL | 3 refills | Status: DC
Start: 2021-09-17 — End: 2021-11-20
  Filled 2021-09-17: qty 30, 30d supply, fill #0

## 2021-09-17 MED ORDER — ALBUTEROL SULFATE HFA 108 (90 BASE) MCG/ACT IN AERS *I*
1.0000 | INHALATION_SPRAY | Freq: Four times a day (QID) | RESPIRATORY_TRACT | 3 refills | Status: DC | PRN
Start: 2021-09-17 — End: 2021-12-05
  Filled 2021-09-17: qty 18, 25d supply, fill #0

## 2021-09-17 MED ORDER — PRAZOSIN HCL 2 MG PO CAPS *I*
2.0000 mg | ORAL_CAPSULE | Freq: Every evening | ORAL | 3 refills | Status: DC
Start: 2021-09-17 — End: 2021-11-19
  Filled 2021-09-17: qty 30, 30d supply, fill #0

## 2021-09-17 NOTE — Progress Notes (Signed)
[facility] SOCIAL WORK  PHARMACY FORM     Todays date:  September 17, 2021    Patient Name: Alexis Vang      Medical Record #: [id number]   DOB: Jul 11, 1991  Patients Address: homeless [place]              Social Worker: Elissa Hefty, LMSW       Date of Service: September 17, 2021       Funding Source: SW Medication Assistance Fund  ___________________________________________________________________    Pharmacy Information:  Date/time sent: September 17, 2021     Time needed: 09/17/21    [facility]    Medication Pick-up Preference: Patient will pick up at the pharmacy    Pharmacy Contact: Zollie Scale, [id number]  Social work signature: Elissa Hefty, LMSW  Supervisor/Manager Approval (if indicated):  Date:  (supervisor signature not required for Medicaid pending)
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
## 2021-09-17 NOTE — Progress Notes (Signed)
Pt came to appointment 45 min late.  Writer attempted to do an in person NPI, but pt kept falling asleep after each question.  NPI not completed.  Dr. Onnie Boer in to see pt for NOB.  Pt was scratching all over her body throughout the visit and began coughing.  Will swab for Covid - 19, per Dr. Onnie Boer.

## 2021-09-18 ENCOUNTER — Telehealth: Payer: Self-pay

## 2021-09-18 DIAGNOSIS — J45909 Unspecified asthma, uncomplicated: Secondary | ICD-10-CM | POA: Insufficient documentation

## 2021-09-18 DIAGNOSIS — Z5900 Homelessness unspecified: Secondary | ICD-10-CM | POA: Insufficient documentation
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
## 2021-09-18 NOTE — Telephone Encounter (Signed)
Writer attempted to contact patient and schedule a two week [facility] OBC appt. VM is full.

## 2021-09-19 NOTE — Telephone Encounter (Signed)
Second attempt to schedule the patients next [facility] OBC appt. The phone did not ring and went to VM. VM is full.

## 2021-09-24 NOTE — Telephone Encounter (Signed)
Third attempt to schedule the patient. The phone did ring, but the VM is full.

## 2021-09-27 ENCOUNTER — Other Ambulatory Visit: Payer: Self-pay

## 2021-10-02 ENCOUNTER — Telehealth: Payer: Self-pay

## 2021-10-02 NOTE — Telephone Encounter (Signed)
Pt's social worker Maralyn Sago from [facility] called to reschedule canceled 8/30 intake appt with therapist. Maralyn Sago stated pt does not have a phone. Maralyn Sago can be contacted to reschedule appt at [phone].

## 2021-10-03 ENCOUNTER — Other Ambulatory Visit: Payer: Self-pay

## 2021-10-03 ENCOUNTER — Other Ambulatory Visit
Admission: RE | Admit: 2021-10-03 | Discharge: 2021-10-03 | Disposition: A | Discharge status: Home / Self Care | Payer: MEDICAID | Source: Ambulatory Visit | Attending: Obstetrics and Gynecology | Admitting: Obstetrics and Gynecology

## 2021-10-03 ENCOUNTER — Ambulatory Visit: Payer: MEDICAID

## 2021-10-03 ENCOUNTER — Ambulatory Visit: Payer: MEDICAID | Attending: Obstetrics and Gynecology | Admitting: Obstetrics and Gynecology

## 2021-10-03 VITALS — BP 114/55 | HR 78 | Ht 67.0 in | Wt 145.3 lb

## 2021-10-03 DIAGNOSIS — Z3A17 17 weeks gestation of pregnancy: Secondary | ICD-10-CM | POA: Insufficient documentation

## 2021-10-03 DIAGNOSIS — O219 Vomiting of pregnancy, unspecified: Secondary | ICD-10-CM | POA: Insufficient documentation

## 2021-10-03 DIAGNOSIS — Z59 Unhousedness unspecified: Secondary | ICD-10-CM | POA: Insufficient documentation

## 2021-10-03 DIAGNOSIS — Z3A15 15 weeks gestation of pregnancy: Secondary | ICD-10-CM

## 2021-10-03 DIAGNOSIS — O09299 Supervision of pregnancy with other poor reproductive or obstetric history, unspecified trimester: Secondary | ICD-10-CM

## 2021-10-03 DIAGNOSIS — O34219 Maternal care for unspecified type scar from previous cesarean delivery: Secondary | ICD-10-CM | POA: Insufficient documentation

## 2021-10-03 DIAGNOSIS — O09292 Supervision of pregnancy with other poor reproductive or obstetric history, second trimester: Secondary | ICD-10-CM | POA: Insufficient documentation

## 2021-10-03 DIAGNOSIS — O99519 Diseases of the respiratory system complicating pregnancy, unspecified trimester: Secondary | ICD-10-CM

## 2021-10-03 DIAGNOSIS — F29 Unspecified psychosis not due to a substance or known physiological condition: Secondary | ICD-10-CM | POA: Insufficient documentation

## 2021-10-03 DIAGNOSIS — F1990 Other psychoactive substance use, unspecified, uncomplicated: Secondary | ICD-10-CM | POA: Insufficient documentation

## 2021-10-03 DIAGNOSIS — O99342 Other mental disorders complicating pregnancy, second trimester: Secondary | ICD-10-CM | POA: Insufficient documentation

## 2021-10-03 DIAGNOSIS — A599 Trichomoniasis, unspecified: Secondary | ICD-10-CM | POA: Insufficient documentation

## 2021-10-03 DIAGNOSIS — F99 Mental disorder, not otherwise specified: Secondary | ICD-10-CM | POA: Insufficient documentation

## 2021-10-03 DIAGNOSIS — O0992 Supervision of high risk pregnancy, unspecified, second trimester: Secondary | ICD-10-CM | POA: Insufficient documentation

## 2021-10-03 DIAGNOSIS — Z8632 Personal history of gestational diabetes: Secondary | ICD-10-CM | POA: Insufficient documentation

## 2021-10-03 DIAGNOSIS — J45909 Unspecified asthma, uncomplicated: Secondary | ICD-10-CM | POA: Insufficient documentation

## 2021-10-03 DIAGNOSIS — O26892 Other specified pregnancy related conditions, second trimester: Secondary | ICD-10-CM | POA: Insufficient documentation

## 2021-10-03 DIAGNOSIS — O99512 Diseases of the respiratory system complicating pregnancy, second trimester: Secondary | ICD-10-CM | POA: Insufficient documentation

## 2021-10-03 DIAGNOSIS — B86 Scabies: Secondary | ICD-10-CM | POA: Insufficient documentation

## 2021-10-03 DIAGNOSIS — O355XX0 Maternal care for (suspected) damage to fetus by drugs, not applicable or unspecified: Secondary | ICD-10-CM

## 2021-10-03 LAB — TYPE AND SCREEN FOR PNP
ABO RH Blood Type: A POS
Antibody Screen: NEGATIVE

## 2021-10-03 LAB — POCT 2 URINALYSIS DIPSTICK
Glucose,UA POCT: NORMAL mg/dL
Lot #: 56919708
Protein,UA POCT: NEGATIVE mg/dL

## 2021-10-03 MED ORDER — ONDANSETRON HCL 4 MG PO TABS *I*
4.0000 mg | ORAL_TABLET | Freq: Three times a day (TID) | ORAL | 3 refills | Status: DC | PRN
Start: 2021-10-03 — End: 2022-01-21
  Filled 2021-10-03: qty 45, 15d supply, fill #0

## 2021-10-03 NOTE — Progress Notes (Signed)
[facility] SOCIAL WORK  PHARMACY FORM     Todays date:  October 03, 2021    Patient Name: Alexis Vang      Medical Record #: [id number]   DOB: 18-Dec-1991  Patients Address: homeless [place]              Social Worker: Perrin Smack, LMSW       Date of Service: October 03, 2021       Funding Source: SW Medication Assistance Fund   SW contacted and asked to cover the cost of the copay for the pt, SW voucher cost is $5.99  ___________________________________________________________________    Pharmacy Information:  Date/time sent: October 03, 2021     Time needed: asap    Patient Location: Outpatient    Medication Pick-up Preference: Patient will pick up at the pharmacy    Pharmacy Contact: Lanora Manis  Social work signature: Perrin Smack, LMSW  Supervisor/Manager Approval (if indicated):  Date:  (supervisor signature not required for Medicaid pending)    Perrin Smack, LMSW  Emergency Department Social Worker  [phone]
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
## 2021-10-03 NOTE — Assessment & Plan Note (Addendum)
-   -   Reports prior C/S at "6 months" in 2020.  Per patient, she had neonatal demise and fetus had hydrops and clubbed feet  - Denies being told she had a vertical uterine incision or needed to have c-sections in the future  - Cannot remember which hospital she delivered at so no Op note available  - Prefers repeat C-Section

## 2021-10-03 NOTE — Assessment & Plan Note (Signed)
-   SW offered emergency housing on 9/20, but patient declined  - [place] [facility]

## 2021-10-03 NOTE — Assessment & Plan Note (Signed)
-   Did not pick up Rx for albuterol.

## 2021-10-03 NOTE — Assessment & Plan Note (Signed)
-   Prior pregnancy in 2020  - Fetus with hydrops and clubbed feet  - Reports she required 2 amnioreductions for polyhydramnios  - Ultimately delivered at "6 months" and had neonatal demise  - Declined aneuploidy screening  - Anatomic [place] ordered

## 2021-10-03 NOTE — Assessment & Plan Note (Addendum)
-   See Problem List above for routine prenatal care  - Normotensive  - FHR WNL  - The patient has not had prenatal labs drawn yet.  Tia Hudson, [facility] PCT will walk patient to lab after visit  - Counseled patient on carrier and aneuploidy screening  The patient declined testing.  - No Pap smears in file.  Patient did not tolerate pelvic exam today so specimen not obtained.  Of note, the patient wants to film her Pap smear on her phone.  - Having intermittent nausea.  Ondansetron ordered.   - Did not address PPBC or feeding today.

## 2021-10-03 NOTE — Assessment & Plan Note (Addendum)
-   Baseline HELLP labs neg  - Will order spot urine at next visit.   - ASA 162 mg daily prescribed

## 2021-10-03 NOTE — Assessment & Plan Note (Signed)
-   Persistent pruritus   - Treated in 07/2021 and given Rx again at NOB on 09/17/21  - Today, patient report she used permethrin and pruritus improved.

## 2021-10-03 NOTE — Assessment & Plan Note (Signed)
-   Psych admission in 07/2021, started on:  Escitalopram 10mg  daily, Prazosin 2mg  nightly, Abilify 5mg  daily and 10mg  nightly   - Has discontinued Abilify as of 09/17/2021  - 10/03/21: Patient reports she did not pick up Rx for medications

## 2021-10-03 NOTE — Assessment & Plan Note (Signed)
-   Trichomonas cx pos 09/06/21; Treated in Triage on 09/09/21  - GC/CT, HIV, hep C, syphilis neg  - TOC 4-6 weeks after treatment  - HIV in 3rd trimester

## 2021-10-03 NOTE — Assessment & Plan Note (Signed)
-   Early 1 hr gtt ordered at last visit
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
## 2021-10-04 ENCOUNTER — Telehealth: Payer: Self-pay

## 2021-10-04 ENCOUNTER — Encounter: Payer: Self-pay | Admitting: Emergency Medicine

## 2021-10-04 LAB — HIV-1/2  ANTIGEN/ANTIBODY SCREEN WITH CONFIRMATION: HIV 1&2 ANTIGEN/ANTIBODY: NONREACTIVE

## 2021-10-04 LAB — PRENATAL PROFILE
Baso # K/uL: 0 10*3/uL (ref 0.0–0.1)
Basophil %: 0.3 %
Eos # K/uL: 0.2 10*3/uL (ref 0.0–0.4)
Eosinophil %: 1.5 %
HBV S Ag: NEGATIVE
Hematocrit: 33 % — ABNORMAL LOW (ref 34–45)
Hemoglobin: 10.7 g/dL — ABNORMAL LOW (ref 11.2–15.7)
IMM Granulocytes #: 0.1 10*3/uL — ABNORMAL HIGH (ref 0.0–0.0)
IMM Granulocytes: 0.7 %
Lymph # K/uL: 2.1 10*3/uL (ref 1.2–3.7)
Lymphocyte %: 16.6 %
MCH: 32 pg (ref 26–32)
MCHC: 33 g/dL (ref 32–36)
MCV: 98 fL — ABNORMAL HIGH (ref 79–95)
Mono # K/uL: 0.9 10*3/uL (ref 0.2–0.9)
Monocyte %: 7.3 %
Neut # K/uL: 9.3 10*3/uL — ABNORMAL HIGH (ref 1.6–6.1)
Nucl RBC # K/uL: 0 10*3/uL (ref 0.0–0.0)
Nucl RBC %: 0 /100 WBC (ref 0.0–0.2)
Platelets: 309 10*3/uL (ref 160–370)
RBC: 3.3 MIL/uL — ABNORMAL LOW (ref 3.9–5.2)
RDW: 12.5 % (ref 11.7–14.4)
Rubella IgG AB: NEGATIVE
Seg Neut %: 73.6 %
Syphilis Screen: NEGATIVE
Syphilis Status: NONREACTIVE
WBC: 12.7 10*3/uL — ABNORMAL HIGH (ref 4.0–10.0)

## 2021-10-04 LAB — LEAD, BLOOD

## 2021-10-04 LAB — HEPATITIS C ANTIBODY: Hep C Ab: NEGATIVE

## 2021-10-04 LAB — LEAD VENOUS: Lead,Venous: <1 ug/dl (ref 0.0–4.9)

## 2021-10-04 NOTE — Telephone Encounter (Signed)
Received message in clinic that Sunday Corn from [facility] called the line in the provider work room regarding this patient.    Irving Burton stated that the patient is staying with them, but per the patient she is "bedridden" and they are looking for confirmation regarding her status as she cannot stay there if she is truly medically bedridden.    Message routed to [facility] team and SW to further clarify and assist.    Call was from:  Sunday Corn, Intake Manager at [facility]  [phone] Ext [phone]
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
## 2021-10-04 NOTE — Telephone Encounter (Addendum)
Returned call to Sunday Corn at [facility].  Left message that pt has not been confined to bedrest, gave my direct # at [phone] to contact for further discussion as needed.    10/04/21 4:30pm:  Also faxed letter regarding above per [facility] request.

## 2021-10-08 ENCOUNTER — Ambulatory Visit: Payer: Self-pay | Admitting: Obstetrics and Gynecology

## 2021-10-08 ENCOUNTER — Telehealth: Payer: Self-pay

## 2021-10-08 NOTE — Telephone Encounter (Signed)
Writer spoke to [place] at [facility] to schedule the pt's future appts. Per Michelle Nasuti the patient left yesterday. While on the phone Michelle Nasuti stated [facility] received a call from the patient's "mother." Per pt's mother her name is "Alexis Vang." Writer told Michelle Nasuti a message would be sent to social work in the clinic to make them aware.     Routed to social work as an Financial planner.

## 2021-10-13 ENCOUNTER — Other Ambulatory Visit: Payer: Self-pay

## 2021-10-14 ENCOUNTER — Ambulatory Visit: Payer: Self-pay | Admitting: Obstetrics and Gynecology

## 2021-10-21 NOTE — Progress Notes (Signed)
Social Work Note -   Received message from [employer]	[place] from [employer] [phone]).  At this time, there are no available intake appts - pt could be put on a wait list with anticipated openings at the end of November- beginning of December.  Call returned, left message asking that pt be placed on the wait list, left my direct # for questions/more information.    Elissa Hefty, LMSW [phone], [phone]

## 2021-10-22 ENCOUNTER — Ambulatory Visit: Payer: MEDICAID | Attending: Obstetrics and Gynecology

## 2021-10-22 ENCOUNTER — Other Ambulatory Visit: Payer: Self-pay

## 2021-10-22 DIAGNOSIS — Z8279 Family history of other congenital malformations, deformations and chromosomal abnormalities: Secondary | ICD-10-CM | POA: Insufficient documentation

## 2021-10-22 DIAGNOSIS — O99322 Drug use complicating pregnancy, second trimester: Secondary | ICD-10-CM | POA: Insufficient documentation

## 2021-10-22 DIAGNOSIS — O355XX0 Maternal care for (suspected) damage to fetus by drugs, not applicable or unspecified: Secondary | ICD-10-CM | POA: Insufficient documentation

## 2021-10-22 DIAGNOSIS — O99342 Other mental disorders complicating pregnancy, second trimester: Secondary | ICD-10-CM | POA: Insufficient documentation

## 2021-10-22 DIAGNOSIS — O34219 Maternal care for unspecified type scar from previous cesarean delivery: Secondary | ICD-10-CM | POA: Insufficient documentation

## 2021-10-22 DIAGNOSIS — O0992 Supervision of high risk pregnancy, unspecified, second trimester: Secondary | ICD-10-CM | POA: Insufficient documentation
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
## 2021-10-24 ENCOUNTER — Observation Stay
Admission: RE | Admit: 2021-10-24 | Discharge: 2021-10-24 | Disposition: A | Discharge status: Home / Self Care | Payer: MEDICAID | Source: Ambulatory Visit | Attending: Obstetrics and Gynecology | Admitting: Obstetrics and Gynecology

## 2021-10-24 DIAGNOSIS — Z113 Encounter for screening for infections with a predominantly sexual mode of transmission: Secondary | ICD-10-CM | POA: Insufficient documentation

## 2021-10-24 DIAGNOSIS — Z59819 Housing instability, housed unspecified: Secondary | ICD-10-CM | POA: Insufficient documentation

## 2021-10-24 DIAGNOSIS — Z3A20 20 weeks gestation of pregnancy: Secondary | ICD-10-CM | POA: Insufficient documentation

## 2021-10-24 DIAGNOSIS — F29 Unspecified psychosis not due to a substance or known physiological condition: Secondary | ICD-10-CM | POA: Diagnosis present

## 2021-10-24 DIAGNOSIS — O99322 Drug use complicating pregnancy, second trimester: Principal | ICD-10-CM | POA: Insufficient documentation

## 2021-10-24 DIAGNOSIS — Z202 Contact with and (suspected) exposure to infections with a predominantly sexual mode of transmission: Secondary | ICD-10-CM | POA: Insufficient documentation

## 2021-10-24 DIAGNOSIS — F1990 Other psychoactive substance use, unspecified, uncomplicated: Secondary | ICD-10-CM | POA: Insufficient documentation

## 2021-10-24 LAB — MEDICALLY URGENT HIV-1/2 ANTIBODY SCREEN WITH CONFIRMATION: Rapid HIV 1&2: NEGATIVE

## 2021-10-24 NOTE — Discharge Instructions (Signed)
[facility] [place]   OB Triage Discharge Instructions    You were seen in triage after transported to the [facility] emergency room by emergency medical services. You had concern for baby. We performed a bedside ultrasound, baby had a normal heart beat and was appropriately active/moving. The placenta and fluid around baby also appeared normal. This is reassuring. We also did screen for sexually transmitted infections.    I have concern for your mental health and recommend evaluation by psychiatry.  We have provided an escort to a psych ED and encourage you to stay for evaluation.      You are being discharged to:  psychiatric emergency department  The following tests are still pending:  throat culture, urine cultures    You may resume your normal activity and diet, please use condoms to minimize your risk of sexually transmitted infections    You should follow up with your primary obstetric provider.       Call your primary obstetric provider or return to triage if you experience the following:  1. Worsening of your symptoms  2. Vaginal bleeding  3. Leakage of fluids from your vagina  4. Severe headache, chest pain, difficulty breathing  5. Severe abdominal pain, especially if on the right side  6. Baby moving less or not at all    If you cannot reach your obstetric provider, call 911 if it is an emergency.

## 2021-10-24 NOTE — Progress Notes (Signed)
Social Work Note  Alexis Vang is known to [facility] staff from [place] and [facility] visits.  Alexis Vang was just seen by SW Alexis Vang on 10/25. Please see erecord chart review for a complete psychosocial assessment and f/u notes. Multiple risk factors have been identified including homelessness, SUD with + cocaine screen on 8/22, h/o psychosis with admission in 8/22.    Per discussion with NP Alexis Vang, Alexis Vang presented to Triage stating her baby had died in utero and was rotting inside of her.  NP Alexis Vang has identified no OB concerns, she is attempting to have psych evaluate her.    Met with pt, inquired where she will go when d/c'd.  Pt reports that she stayed at [address] last night with her friend Alexis Vang  and the evening before that on [employer].  Alexis Vang thinks that she will go to [facility] again but may want to go through Emergency Housing for placement.    Alexis. Tamala Vang then began to talk about her income, stating that her mother sends it to her sister but that she did not know where her sister lived.  Pt then progressed to saying she gets SSI, but does not know her social security number and cant access her funds.  Alexis. Tamala Vang continued to interject random statements.    Plan:  SW Alexis Vang initiated a health home care management referral earlier this week.             Please contact ED SW for assistance with discharge planning.  [id number].             SW to f/u with pt tomorrow if she is still on the [facility] service, otherwise will f/u in [facility].   Alexis Vang, [place] [phone]

## 2021-10-24 NOTE — Progress Notes (Cosign Needed)
Patient arrived to triage via [facility] with concerns of decreased fetal movement.  Patient denies; pain, vaginal bleeding, leaking of fluid.   10/24/21 1516   Vitals   BP 108/56   BP MAP  78 mmHg   Heart Rate 74   Resp 16   SpO2 99 %
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
## 2021-10-25 LAB — HIV-1/2  ANTIGEN/ANTIBODY SCREEN WITH CONFIRMATION: HIV 1&2 ANTIGEN/ANTIBODY: NONREACTIVE

## 2021-10-25 LAB — TRICHOMONAS NAAT (PCR): Trichomonas DNA amplification: 0

## 2021-10-25 LAB — SYPHILIS SCREEN
Syphilis Screen: NEGATIVE
Syphilis Status: NONREACTIVE

## 2021-10-25 LAB — CHLAMYDIA NAAT (PCR)
Chlamydia Plasmid DNA Amplification: 0
Chlamydia Plasmid DNA Amplification: 0

## 2021-10-25 LAB — HEPATITIS C ANTIBODY: Hep C Ab: NEGATIVE

## 2021-10-25 LAB — N. GONORRHOEAE NAAT (PCR)
N. gonorrhoeae DNA Amplification: 0
N. gonorrhoeae DNA Amplification: 0

## 2021-10-25 LAB — HEPATITIS B SURFACE ANTIGEN: HBV S Ag: NEGATIVE

## 2021-10-28 NOTE — Assessment & Plan Note (Deleted)
-  Hx of polysubstance use including cocaine, methamphetamine, and heroin  -Seen in triage for presumed psychosis episode and admitted to cocaine use   -Did not present to [facility]

## 2021-10-28 NOTE — Assessment & Plan Note (Deleted)
-   Psych admission in 07/2021, started on:  Escitalopram 10mg  daily, Prazosin 2mg  nightly, Abilify 5mg  daily and 10mg  nightly   - Has discontinued Abilify as of 09/17/2021  - 10/24/21: In triage for acute psychosis episode, left before being evaluated by psychiatry. Recommended abilify 5Qam 10Qhs lexapro 10QD, prazosin 2mg  QHS

## 2021-10-29 DIAGNOSIS — F29 Unspecified psychosis not due to a substance or known physiological condition: Secondary | ICD-10-CM

## 2021-10-29 DIAGNOSIS — F1990 Other psychoactive substance use, unspecified, uncomplicated: Secondary | ICD-10-CM

## 2021-10-29 LAB — RPR (HT): RPR Screen (HT): NONREACTIVE

## 2021-10-31 ENCOUNTER — Other Ambulatory Visit: Payer: Self-pay

## 2021-10-31 ENCOUNTER — Ambulatory Visit: Payer: Self-pay

## 2021-11-01 ENCOUNTER — Telehealth: Payer: Self-pay

## 2021-11-01 NOTE — Telephone Encounter (Signed)
Writer attempted to call and reschedule [facility] OBC. VM is full.

## 2021-11-04 NOTE — Telephone Encounter (Signed)
Second attempt to reschedule [facility]. VM is full.

## 2021-11-06 NOTE — Telephone Encounter (Signed)
Third attempt to contact the patient and reschedule. No answer and the VM is full.

## 2021-11-09 ENCOUNTER — Encounter: Payer: Self-pay | Admitting: Student in an Organized Health Care Education/Training Program

## 2021-11-09 ENCOUNTER — Other Ambulatory Visit: Payer: Self-pay

## 2021-11-09 ENCOUNTER — Inpatient Hospital Stay
Admission: EM | Admit: 2021-11-09 | Discharge: 2021-11-15 | DRG: 566 | Disposition: A | Discharge status: Home / Self Care | Payer: Medicaid Other | Source: Ambulatory Visit | Attending: Psychiatry | Admitting: Psychiatry

## 2021-11-09 DIAGNOSIS — R21 Rash and other nonspecific skin eruption: Secondary | ICD-10-CM | POA: Diagnosis present

## 2021-11-09 DIAGNOSIS — A4901 Methicillin susceptible Staphylococcus aureus infection, unspecified site: Secondary | ICD-10-CM | POA: Diagnosis present

## 2021-11-09 DIAGNOSIS — Z3490 Encounter for supervision of normal pregnancy, unspecified, unspecified trimester: Secondary | ICD-10-CM

## 2021-11-09 DIAGNOSIS — F419 Anxiety disorder, unspecified: Secondary | ICD-10-CM

## 2021-11-09 DIAGNOSIS — F449 Dissociative and conversion disorder, unspecified: Secondary | ICD-10-CM

## 2021-11-09 DIAGNOSIS — F22 Delusional disorders: Secondary | ICD-10-CM

## 2021-11-09 DIAGNOSIS — O99320 Drug use complicating pregnancy, unspecified trimester: Secondary | ICD-10-CM

## 2021-11-09 DIAGNOSIS — F1490 Cocaine use, unspecified, uncomplicated: Principal | ICD-10-CM

## 2021-11-09 DIAGNOSIS — Z8659 Personal history of other mental and behavioral disorders: Secondary | ICD-10-CM

## 2021-11-09 DIAGNOSIS — F4310 Post-traumatic stress disorder, unspecified: Secondary | ICD-10-CM

## 2021-11-09 DIAGNOSIS — F1010 Alcohol abuse, uncomplicated: Secondary | ICD-10-CM

## 2021-11-09 DIAGNOSIS — Z62813 Personal history of forced labor or sexual exploitation in childhood: Secondary | ICD-10-CM

## 2021-11-09 DIAGNOSIS — O99712 Diseases of the skin and subcutaneous tissue complicating pregnancy, second trimester: Secondary | ICD-10-CM | POA: Diagnosis present

## 2021-11-09 DIAGNOSIS — R102 Pelvic and perineal pain: Secondary | ICD-10-CM

## 2021-11-09 DIAGNOSIS — X58XXXS Exposure to other specified factors, sequela: Secondary | ICD-10-CM | POA: Diagnosis present

## 2021-11-09 DIAGNOSIS — B9689 Other specified bacterial agents as the cause of diseases classified elsewhere: Secondary | ICD-10-CM | POA: Diagnosis present

## 2021-11-09 DIAGNOSIS — O98312 Other infections with a predominantly sexual mode of transmission complicating pregnancy, second trimester: Secondary | ICD-10-CM | POA: Diagnosis present

## 2021-11-09 DIAGNOSIS — B9561 Methicillin susceptible Staphylococcus aureus infection as the cause of diseases classified elsewhere: Secondary | ICD-10-CM | POA: Diagnosis present

## 2021-11-09 DIAGNOSIS — O26892 Other specified pregnancy related conditions, second trimester: Secondary | ICD-10-CM

## 2021-11-09 DIAGNOSIS — Z59 Unhousedness unspecified: Secondary | ICD-10-CM

## 2021-11-09 DIAGNOSIS — F4312 Post-traumatic stress disorder, chronic: Secondary | ICD-10-CM | POA: Diagnosis present

## 2021-11-09 DIAGNOSIS — L209 Atopic dermatitis, unspecified: Secondary | ICD-10-CM | POA: Diagnosis present

## 2021-11-09 DIAGNOSIS — A5901 Trichomonal vulvovaginitis: Secondary | ICD-10-CM | POA: Diagnosis present

## 2021-11-09 DIAGNOSIS — L0109 Other impetigo: Secondary | ICD-10-CM | POA: Diagnosis present

## 2021-11-09 DIAGNOSIS — Z62810 Personal history of physical and sexual abuse in childhood: Secondary | ICD-10-CM | POA: Diagnosis present

## 2021-11-09 DIAGNOSIS — O99322 Drug use complicating pregnancy, second trimester: Secondary | ICD-10-CM | POA: Diagnosis present

## 2021-11-09 DIAGNOSIS — Z3A22 22 weeks gestation of pregnancy: Secondary | ICD-10-CM

## 2021-11-09 DIAGNOSIS — Z20822 Contact with and (suspected) exposure to covid-19: Secondary | ICD-10-CM | POA: Diagnosis present

## 2021-11-09 DIAGNOSIS — O99342 Other mental disorders complicating pregnancy, second trimester: Principal | ICD-10-CM | POA: Diagnosis present

## 2021-11-09 DIAGNOSIS — F70 Mild intellectual disabilities: Secondary | ICD-10-CM | POA: Diagnosis present

## 2021-11-09 DIAGNOSIS — O209 Hemorrhage in early pregnancy, unspecified: Secondary | ICD-10-CM

## 2021-11-09 LAB — URINALYSIS WITH MICROSCOPIC
Bacteria,UA: NONE SEEN
Blood,UA: NEGATIVE
Glucose,UA: NEGATIVE
Nitrite,UA: NEGATIVE
Specific Gravity,UA: 1.036 — ABNORMAL HIGH (ref 1.002–1.030)
pH,UA: 6 (ref 5.0–8.0)

## 2021-11-09 LAB — HOLD GRAY

## 2021-11-09 LAB — VAGINITIS SCREEN: DNA PROBE

## 2021-11-09 LAB — CONFIRMATORY TYPE: Confirmatory ABORH: A POS

## 2021-11-09 LAB — TYPE AND SCREEN
ABO RH Blood Type: A POS
Antibody Screen: NEGATIVE

## 2021-11-09 LAB — TRICHOMONAS NAAT (PCR)

## 2021-11-09 NOTE — ED Procedure Documentation (Addendum)
Procedures   Ultrasound - Transabdominal Pelvic  Performed by: Micheline Maze, MD  Authorized by: Billie Lade, MD       Procedure Details:     Indications: pregnancy with abd / pelvic pain and pregnancy with vaginal bleeding    Trimester: presumed 2nd trimester    Assessment For: intrauterine pregnancy and fetal heart rate      Structures:       Uterus: visualized      Findings:     Yolk sac:   Fetal pole:   Fetal Heart activity:   Fetal Heart rate (bpm): 138            Impression:    intrauterine pregnancy           Images were interpreted by me and archived to [facility] PACS.              Micheline Maze, MD     Micheline Maze, MD  Resident  11/09/21 1436    Ultrasound Procedure: I was present and participated during the critical and key components of the procedure, and immediately available during the remainder of the procedure.    Images: Images were reviewed and interpreted by me and I agree with the resident interpretation as documented.    Billie Lade, MD as of 11/10/2021 at 8:06 AM       Billie Lade, MD  11/10/21 [phone]
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
## 2021-11-09 NOTE — CPEP Notes (Signed)
CPEP Triage Note    Arrival    Patient is oriented to unit and CPEP evaluation process: Yes  Reviewed cell phone and visitor policies: yes   Reviewed contraband items/milieu safety concerns and confirmed no safety concerns present: yes    Patient is accompanied by: patient alone  Patient under MHT: No    History and Chief Complaint    Reason for current presentation: Patient denied any previous mental health history or ever being in treatment.  Patient presented to the hospital with complaint of abd pain and pelvic pain also sharing that she was 6th month pregnant.  Patient is unclear as to why she need the psych evaluation denied SI/AVH at time. Shared that she recently came her from [place] and is currently homeless.     Current Mental Health Provider(s): Denied currently being linked     Any recent exposure to infestations(lice, scabies, bedbugs, fleas) or other communicable diseases: No    Substance use: denies     Ingestion: Denies    Self-harm: no    Medication Data Collection    Medication data collection: Yes: With Patient (Reliable)    Physical Assessment    Pain assessment: Last Nursing documented pain:  0-10 Scale: 10 (11/09/21 1318)    Last Filed Vitals    11/09/21 1600   BP: 111/66   Pulse:    Resp:    Temp:    SpO2:        Medical / Surgical History    PMH: History reviewed. No pertinent past medical history.    PSH: History reviewed. No pertinent surgical history.    Review of Systems    Any additional concerns: no    Anticipated track: 3    Karene Fry, RN, 9:23 PM
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
## 2021-11-09 NOTE — ED Triage Notes (Addendum)
Pt coming from [facility], c/o "dry skin" and a rash on her hands. Pt states she is 6 months pregnant, denies having any prenatal care or OB. Endorses abd pain and some vaginal bleeding. Denies SI/HI, denies drugs/alcohol.               Prehospital medications given: No

## 2021-11-10 ENCOUNTER — Encounter: Payer: Self-pay | Admitting: Obstetrics and Gynecology

## 2021-11-10 DIAGNOSIS — L0100 Impetigo, unspecified: Secondary | ICD-10-CM

## 2021-11-10 DIAGNOSIS — L209 Atopic dermatitis, unspecified: Secondary | ICD-10-CM

## 2021-11-10 LAB — COVID-19 NAAT (PCR): COVID-19 NAAT (PCR): NEGATIVE

## 2021-11-10 LAB — HIV-1/2  ANTIGEN/ANTIBODY SCREEN WITH CONFIRMATION: HIV 1&2 ANTIGEN/ANTIBODY: NONREACTIVE

## 2021-11-10 LAB — DRUG SCREEN CHEMICAL DEPENDENCY, URINE
Amphetamine,UR: NEGATIVE
Benzodiazepinen,UR: NEGATIVE
Cocaine/Metab,UR: POSITIVE
Fentanyl, UR: NEGATIVE ng/mL
Opiates,UR: NEGATIVE
THC Metabolite,UR: NEGATIVE

## 2021-11-10 LAB — GRAM STAIN

## 2021-11-10 LAB — SYPHILIS SCREEN
Syphilis Screen: NEGATIVE
Syphilis Status: NONREACTIVE

## 2021-11-10 LAB — CHLAMYDIA NAAT (PCR): Chlamydia Plasmid DNA Amplification: 0

## 2021-11-10 LAB — AEROBIC CULTURE: Aerobic Culture: 0

## 2021-11-10 LAB — TRICHOMONAS NAAT (PCR): Trichomonas DNA amplification: POSITIVE — AB

## 2021-11-10 LAB — HEPATITIS B SURFACE ANTIGEN: HBV S Ag: NEGATIVE

## 2021-11-10 LAB — BUPRENORPHINE, UR SCRN: Buprenorphine, UR SCRN: NEGATIVE ng/mL

## 2021-11-10 LAB — HEPATITIS C ANTIBODY: Hep C Ab: NEGATIVE

## 2021-11-10 LAB — RUBELLA ANTIBODY, IGG: Rubella IgG AB: NEGATIVE

## 2021-11-10 LAB — PERFORMING LAB

## 2021-11-10 LAB — UNABLE TO PERFORM ADD-ON TESTING 1

## 2021-11-10 LAB — N. GONORRHOEAE NAAT (PCR): N. gonorrhoeae DNA Amplification: 0

## 2021-11-10 MED ORDER — TRIAMCINOLONE ACETONIDE 0.1 % EX CREA *I*
TOPICAL_CREAM | Freq: Two times a day (BID) | CUTANEOUS | Status: DC
Start: 2021-11-10 — End: 2021-11-15
  Filled 2021-11-10 (×2): qty 15

## 2021-11-10 MED ORDER — DIPHENHYDRAMINE HCL 50 MG ORAL SOLID *WRAPPED*
50.0000 mg | Freq: Two times a day (BID) | ORAL | Status: DC | PRN
Start: 2021-11-10 — End: 2021-11-15
  Administered 2021-11-12 – 2021-11-15 (×5): 50 mg via ORAL
  Filled 2021-11-10 (×4): qty 1
  Filled 2021-11-10: qty 2

## 2021-11-10 MED ORDER — LORAZEPAM 2 MG PO TABS *I*
2.0000 mg | ORAL_TABLET | Freq: Four times a day (QID) | ORAL | Status: DC | PRN
Start: 2021-11-10 — End: 2021-11-13

## 2021-11-10 MED ORDER — CEPHALEXIN 500 MG PO CAPS *I*
500.0000 mg | ORAL_CAPSULE | Freq: Two times a day (BID) | ORAL | Status: DC
  Administered 2021-11-10 – 2021-11-15 (×9): 500 mg via ORAL
  Filled 2021-11-10 (×14): qty 1

## 2021-11-10 MED ORDER — PRENATAL PLUS IRON 29-1 MG PO TABS *I*
1.0000 | ORAL_TABLET | Freq: Every day | ORAL | Status: DC
Start: 2021-11-10 — End: 2021-11-15
  Administered 2021-11-10 – 2021-11-15 (×6): 1 via ORAL
  Filled 2021-11-10 (×7): qty 1

## 2021-11-10 MED ORDER — METRONIDAZOLE 500 MG PO TABS *I*
500.0000 mg | ORAL_TABLET | Freq: Two times a day (BID) | ORAL | Status: DC
Start: 2021-11-10 — End: 2021-11-15
  Administered 2021-11-10 – 2021-11-15 (×10): 500 mg via ORAL
  Filled 2021-11-10 (×13): qty 1

## 2021-11-10 MED ORDER — ESCITALOPRAM OXALATE 10 MG PO TABS *I*
5.0000 mg | ORAL_TABLET | Freq: Every day | ORAL | Status: DC
Start: 2021-11-10 — End: 2021-11-11
  Administered 2021-11-10 – 2021-11-11 (×2): 5 mg via ORAL
  Filled 2021-11-10 (×2): qty 1

## 2021-11-10 MED ORDER — KETOCONAZOLE 2 % EX CREA *I*
TOPICAL_CREAM | Freq: Every day | CUTANEOUS | Status: DC
Start: 2021-11-10 — End: 2021-11-10
  Filled 2021-11-10: qty 30

## 2021-11-10 MED ORDER — MUPIROCIN CALCIUM 2 % EX CREA *I*
TOPICAL_CREAM | Freq: Two times a day (BID) | CUTANEOUS | Status: DC
Start: 2021-11-10 — End: 2021-11-15
  Filled 2021-11-10 (×2): qty 15

## 2021-11-10 MED ORDER — ACETAMINOPHEN 325 MG PO TABS *I*
650.0000 mg | ORAL_TABLET | ORAL | Status: DC | PRN
Start: 2021-11-10 — End: 2021-11-15
  Administered 2021-11-12 (×2): 650 mg via ORAL
  Filled 2021-11-10 (×2): qty 2

## 2021-11-10 MED ORDER — DIPHENHYDRAMINE HCL 25 MG ORAL SOLID *WRAPPED*
50.0000 mg | Freq: Two times a day (BID) | ORAL | Status: DC | PRN
Start: 2021-11-10 — End: 2021-11-10
  Filled 2021-11-10: qty 2

## 2021-11-10 MED ORDER — ALUM & MAG HYDROXIDE-SIMETH 200-200-20 MG/5ML PO SUSP *I*
30.0000 mL | Freq: Three times a day (TID) | ORAL | Status: DC | PRN
Start: 2021-11-10 — End: 2021-11-15

## 2021-11-10 MED ORDER — MAGNESIUM HYDROXIDE 400 MG/5ML PO SUSP *I*
30.0000 mL | Freq: Every day | ORAL | Status: DC | PRN
Start: 2021-11-10 — End: 2021-11-15

## 2021-11-10 NOTE — CPEP Notes (Signed)
Clinical Evaluator Collateral Note    Professional:   [facility]    [address]   [facility]   [place] [address]  [phone]  Elissa Hefty, LMSW  7:45 pm: Unable to [facility]     [facility], [facility], [phone]  9:30 pm: Unable to LVM     Personal:   [name] declined to have any one contacted for personal collateral.          [name], LMSW, 9:16 PM

## 2021-11-10 NOTE — ED Notes (Signed)
11/10/21 2158   Disposition details   Patient is being other (comment)  (EOB)   Family notification not done-no family/no family involvement   Outpatient provider notification of disposition patient has no outpatient provider   Living situation: notification of disposition NA-homeless   Transportation arranged NA- patient admitted

## 2021-11-10 NOTE — ED Notes (Signed)
11/10/21 1230   Vital Signs   Temp 36.4 C (97.5 F)   Temp src Infrared   Heart Rate 82   Heart Rate Source Automated/oximeter   Resp 16   BP 90/52   BP MAP  64 mmHg   BP Method Automatic   BP Location Right arm   Patient Position Lying   Oxygen Therapy   SpO2 98 %   Oximetry Source Rt Hand   O2 Device None (Room air)   Current Pain Assessment   Pain Assessment / Reassessment Assessment   Pain Scale 0-10 (Numeric Scale for Pain Intensity)    0-10 Scale 10   Pain Location/Orientation Hand Left;Hand Right;Pelvis;Perineum   Pain Descriptors Aching   (T) Pain Intervention(s) for current pain Emotional support     Pt reporting feeling dizzy. Food and fluids provided. Provider notified. Will await for further plan of care.
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
## 2021-11-10 NOTE — ED Notes (Signed)
11/10/21 0900   ED PAIN ASSESSMENT   Pain Assessment / Reassessment Assessment   *Is the patient currently in pain? No/Denies   Pain Scale 0-10 (Numeric Scale for Pain Intensity)    0-10 Scale 0

## 2021-11-10 NOTE — CPEP Notes (Signed)
Dr. Clois Comber accepting provider. Pt received physical exam, aerobic swabs, and follow up with oral abx, topical abx, and topical steroids for infection. Needs labs prior to CPEP per Dr. Clois Comber, and then pt can come back up.

## 2021-11-10 NOTE — CPEP Notes (Signed)
Alexis Vang, on-call infection prevention contacted. Infection prevention states that pt should receive a derm consult, during which they may do a scrapings for active infestation. Until 24 hrs after effective treatment = contact precautions. Bleach wipes or any wipes may be used to clean space the pt was sitting in previously. No additional measures need to be taken at this time.

## 2021-11-10 NOTE — CPEP Notes (Signed)
Pt engaged with Clinical research associate in linear conversation, discussing her anger about staff not saving her a dinner tray. Pt then requested an egg salad sandwich, which she proceeded to smear on the windows.    Per DPS staff, pt did not appear "psychotic" during conversation, and was "faking it" around CPEP staff.     Provider notified about ongoing pt agitation.

## 2021-11-10 NOTE — CPEP Notes (Addendum)
Touched base with pt at this time to assess acute present needs, denies at this time.  Pt is pregnant - provided with food and fluids.  Will continue to monitor at this time. Remains on q 15 min safety checks.     Laroy Apple, RN

## 2021-11-10 NOTE — Provider Consult (Addendum)
Dermatology Consult Note  Admit Date:  11/09/2021  Attending Provider:  Edythe Lynn, *                                    Consulting Service:  [facility] CPEP/ED  Consult Question:  Lesions on hands - concern for scabies    HPI:   Alexis Vang is a 30 y.o. G1P0 at [redacted]w[redacted]d female who was awaiting psychiatric evaluation in CPEP at time of Dermatology consultation for lesions on hands with concern for scabies. Subsequently, she was moved to the ED for medical clearance. At bedside, she reports rash on hands present for at least a month and itchy. Rash has been draining fluid, became crusted - she is concerned about infection and requests strong antibiotics. She endorses itchy rash on her trunk, arms, and legs ongoing for same time period.     She also requests treatment for bacterial hepatitis and tetanus shots.    PMH: High-risk pregnancy, PTSD, substance use, psychosis (per chart review of Alexis Vang, [facility] [id number]).  PSH: Unknown  Social History: Homeless    Allergies:   No Known Allergies (drug, envir, food or latex)    Vitals:  Current:   Temp:  [36.4 C (97.5 F)-36.5 C (97.7 F)] 36.4 C (97.5 F)  Heart Rate:  [82-84] 82  Resp:  [16] 16  BP: (90-111)/(52-60) 90/52    Examination:  General: NAD, resting comfortably in chair  Skin: All of the following were examined, and were within normal limits, except as noted: Face, Chest, Abdomen, Extremities (RUE/LUE), Extremities (RLE/LLE) and Digits/Nails   - Abdomen, upper lateral arms, elbows, and anterior thighs eczematous papules   - Bilateral dorsal hands with lichenified plaques with superficial erosions and honey colored crusting - palms are spared    Mineral oil scabies prep performed and NEGATIVE         Assessment:    Eczematous dermatitis with concern for impetiginization in a 30 y.o. female with a PMH significant for high-risk pregnancy in second trimester, undifferentiated psychotic disorder, and homelessness. Scraping with mineral oil prep was  performed and was negative for scabies. Unclear whether she has a history of atopic dermatitis given lack of prior medical records and social situation, but current presentation may be consistent with atopic eruption of pregnancy. Rash on hands is clearly secondarily infected. Aerobic culture swabs were obtained - however, recommend starting empiric antibiotics for MSSA coverage, pending culture results.     Recommendations:     - Isolation precautions not necessary   - Aerobic culture swabs obtained - follow up on results, switch to MRSA coverage if needed  - Start cefadroxil 500 mg twice daily for 7 days   - Start topical mupirocin twice daily for 5 days to the affected areas on the hands  - After finishing mupirocin, start betamethasone dipropionate 0.05% ointment twice daily to the itchy rash on hands   - Stop when rash resolves - can use for up to 2 weeks, then take 1 week off and resume as needed   - Do not use on face, underarms, or groin  - Start triamcinolone 0.1% ointment twice daily as needed to the itchy rash on body   - Stop when rash resolves - use for up to 2 weeks, then take 1 week off and resume as needed   - Do not use on face, underarms, or groin  Patient seen and discussed with Dr. Retia Passe (Dermatology Attending). Plan relayed to Dr. Darrol Angel (Primary Service).      [name], MD,PhD  PGY-3 Dermatology Resident     I saw and evaluated this patient. I agree with the resident's findings and plan of care as documented above. I was present for the key portions of the clinical history, physical exam and treatment plan.    Rash is NOT concerning for scabies. Favor atopic dermatitis with superimposed impetigo. For hands, treat bacterial infection first, then add topical steroid to treat underlying eczema (all detailed above)    [name], MD

## 2021-11-10 NOTE — ED Notes (Signed)
11/10/21 0900   Interventions   Interventions q15 minute monitoring maintained;Frequent supportive interations;Psych education regarding coping skills;Nutrition/hydration;Comfort measures;Assessment of patient response to education;Education

## 2021-11-10 NOTE — CPEP Notes (Signed)
Pt requesting escitalopram 10mg  and prenatal vitamins, but is not prescribed anything per dispense report.

## 2021-11-10 NOTE — CPEP Notes (Addendum)
When searching the pt's personal belongings, the pt had several medications and pt information for the individual listed below:     Alexis Vang  DOB 11/16/1991  [id number]    Believed that this pt may be using an alias during this presentation and/or going by both of these names. Receiving prenatal care under the name Alexis Vang here [facility]). Previous care may have been in [place], under the name Alexis Vang. Pt not forthcoming with this information.
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
## 2021-11-10 NOTE — CPEP Notes (Signed)
Pt observed talking to unseen others at times during the shift.  Provided with support and validation, responsive.  Denies need for prn medications.  Remains on q 15 min safety checks, continuing to monitor at this time.     Laroy Apple, RN
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
## 2021-11-10 NOTE — Progress Notes (Signed)
Confidential Drug Report  Search Terms: [name], 05-07-1991   Search Date: 11/10/2021 08:15:11 AM   The Drug Utilization Report below displays all of the controlled substance prescriptions, if any, that your patient has filled in the last twelve months. The information displayed on this report is compiled from pharmacy submissions to the Department, and accurately reflects the information as submitted by the pharmacies.  This report was requested by: Michiel Cowboy   Reference #: [id number]   There are no results for the search terms that you entered.         Michiel Cowboy, MD  11/10/21 [phone]

## 2021-11-10 NOTE — ED Provider Progress Notes (Addendum)
ED Provider Progress Note      ED Course as of 11/10/21 1853   Sun Nov 10, 2021   1635 BP: 90/52   1635 Temp: 36.4 C (97.5 F)   1635 Heart Rate: 82   1635 Resp: 16   1635 SpO2: 98 %   1734 The patient's rash on bilateral hands is on the dorsal aspects, mostly affecting the joint areas that are higher in tension and mobility causing skin breaking.  The wounds are consistent with a fungal infection given the moisture and slight oozing of the wounds.  He has not appearance similar to that of what you see in athlete's foot.  Patient is keeping her hands inside of socks to protect them, patient was advised to keep her hands warm and dry and to apply ketoconazole cream to the affected areas.  This rash spares the palms of the hands, images of both hands are present in her chart.  Spoke with CPEP provider, they requested that we start her the ketoconazole cream before she goes upstairs and to get a repeat set of vitals.  CPEP states that they will set her up for outpatient dermatology follow-up.  She is cleared to return to CPEP at this time.   [facility] saw patient, put in a note with impression and recommendations below:  Eczematous dermatitis with concern for impetiginization. Scraping with mineral oil prep was performed and was negative for scabies. Rash on hands is clearly secondarily infected.    Recommendations:     - Isolation precautions not necessary   - Aerobic culture swabs obtained - follow up on results, switch to MRSA coverage if needed  - Start cefadroxil 500 mg twice daily for 7 days   - Start topical mupirocin twice daily for 5 days to the affected areas on the hands  - After finishing mupirocin, start betamethasone dipropionate 0.05% ointment twice daily to the itchy rash on hands  - Stop when rash resolves - can use for up to 2 weeks, then take 1 week off and resume as needed  - Do not use on face, underarms, or groin  - Start triamcinolone 0.1% ointment twice daily as needed to the itchy  rash on body  - Stop when rash resolves - use for up to 2 weeks, then take 1 week off and resume as needed  - Do not use on face, underarms, or groin   1834 Cancelling ketoconazole, ordering recommended treatments per Dermatology.   1844 Cefadroxil is not a formulary available here, spoke to pharmacy, changing to Keflex due to similar sfaety in pregnancy, efficacy, and availability.        Donzetta Starch, MD, 11/10/2021, 6:53 PM     Donzetta Starch, MD  Resident  11/10/21 1853       Kaleeah Gingerich, Odis Hollingshead, MD  11/10/21 2250
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
## 2021-11-10 NOTE — CPEP Notes (Signed)
Derm weekend on call paged for scraping to determine if pt's scabies is active, as directed by infectious disease on call.      Billey Gosling, NP  11/10/21 1540

## 2021-11-11 ENCOUNTER — Telehealth: Payer: Self-pay | Admitting: Student in an Organized Health Care Education/Training Program

## 2021-11-11 ENCOUNTER — Encounter: Payer: Self-pay | Admitting: Obstetrics and Gynecology

## 2021-11-11 LAB — VAGINITIS SCREEN: DNA PROBE
Vaginitis Screen:DNA Probe: POSITIVE — AB
Vaginitis Screen:DNA Probe: POSITIVE — AB

## 2021-11-11 LAB — LEAD, BLOOD

## 2021-11-11 LAB — LEAD VENOUS: Lead,Venous: <1 ug/dl (ref 0.0–4.9)

## 2021-11-11 LAB — COCAINE, URINE, CONFIRMATION: Confirm COC/METAB: POSITIVE

## 2021-11-11 MED ORDER — ESCITALOPRAM OXALATE 10 MG PO TABS *I*
10.0000 mg | ORAL_TABLET | Freq: Every day | ORAL | Status: DC
Start: 2021-11-12 — End: 2021-11-13
  Administered 2021-11-12 – 2021-11-13 (×2): 10 mg via ORAL
  Filled 2021-11-11 (×2): qty 1

## 2021-11-11 MED ORDER — RISPERIDONE 1 MG PO TBDP *I*
1.0000 mg | ORAL_TABLET | Freq: Two times a day (BID) | ORAL | Status: DC
Start: 2021-11-11 — End: 2021-11-13
  Administered 2021-11-11 – 2021-11-13 (×4): 1 mg via ORAL
  Filled 2021-11-11 (×5): qty 1

## 2021-11-11 NOTE — ED Notes (Signed)
11/11/21 0753   CIWA-Ar   Nausea and Vomiting 0   Tremor 0   Paroxysmal Sweats 0   Anxiety 0   Agitation 0   Tactile Disturbances 0   Auditory Disturbances 0   Visual Disturbances 0   Headache, Fullness in Head 0   Orientation and Clouding of Sensorium 0   CIWA-Ar Total 0

## 2021-11-11 NOTE — CPEP Notes (Signed)
[name] has been seclusive to her room this shift; offering no physical complaints. Compliant with scheduled medications. Menu was completed for 11/15. CIWA maintained as per policy. [name] is currently resting, Q 15 min safety checks maintained.

## 2021-11-11 NOTE — Progress Notes (Signed)
Patient is in need of treatment for substance use and does not have any natural supports per the patient. Writer would recommend that once she is psychiatrically clear to discuss [facility] to [facility] as they have the ability and understanding of working with someone who is pregnant and combating addiction.

## 2021-11-11 NOTE — CPEP Notes (Signed)
EOB Clinical Social Work Note    Collateral:     [employer], [phone]  4:10PM- Writer contacted [facility] to obtain information regarding the pt and verifying that pt belongings are still in the shelter. Writer was informed by intake coordinator that pt was staying at the shelter last week and that pt belongings are still there and they will keep them for 14 days pt has about 10 days left to pick up belongings. Writer was informed that pt would be able to return but would need to be placed at [facility] by [facility].   Coordinator further reports pt was in good behavior while there. Further express that pt would pace all night, would be seen talking/responding to self and would pick at her skin.

## 2021-11-11 NOTE — CPEP Notes (Signed)
PEOB Provider Progress Note    Patient seen and evaluated by me today, 11/11/2021 at 3:58 PM    Subjective/Patient Concerns:  ***     Interim Events and Behavioral Observations: ***    Meds:   . prenatal plus iron  1 tablet Oral Daily   . escitalopram  5 mg Oral Daily   . cephalexin  500 mg Oral 2 times per day   . mupirocin   Topical 2 times per day   . triamcinolone   Topical 2 times per day   . metroNIDAZOLE  500 mg Oral BID     PRN's:   PRN Medications   Medication Dose Frequency Last Admin   . acetaminophen (TYLENOL) tablet 650 mg  650 mg Q4H PRN     . aluminum & magnesium hydroxide w/simethicone (MAALOX ADVANCED REGULAR) suspension 30 mL  30 mL Q8H PRN     . magnesium hydroxide (MILK OF MAGNESIA) 400 MG/5ML suspension 30 mL  30 mL Daily PRN     . LORazepam (ATIVAN) tablet 2 mg  2 mg 4x Daily PRN     . diphenhydrAMINE (BENADRYL) oral solid 50 mg  50 mg BID PRN         Most recent Vitals:  Vitals:    11/09/21 1541 11/09/21 1600 11/09/21 2300 11/10/21 1230   BP: 111/66 111/66 111/60 90/52   BP Location:   Left arm Right arm   Pulse: 75  84 82   Resp: 18  16 16    Temp: 36.6 C (97.9 F)  36.5 C (97.7 F) 36.4 C (97.5 F)   TempSrc: Temporal  Temporal Infrared   SpO2: 99%   98%   Weight:       Height:           Mental Status Exam:  Mental Status Exam  Appearance: Appropriately dressed (Skin rash on her hands)  Relationship to Interviewer: Cooperative, Eye contact good  Psychomotor Activity: Normal  Abnormal Movements: None  Muscle Strength and Tone: Normal  Station/Gait : Normal  Speech : Regular rate  Language: Normal comprehension  Mood: Dysphoric  Affect: Labile, Dysphoric, Depressed  Thought Process: Logical  Thought Content: No suicidal ideation, No homicidal ideation, No delusions  Perceptions/Associations : No hallucinations  Sensorium: Oriented x3, Alert  Cognition: Fair attention span  [employer] of Knowledge: Below average  Insight : Improving  Judgement: Improving    Additional data:   All labs in the last  72 hours:  Recent Results (from the past 72 hour(s))   Syphilis Screen w Rfx to RPR and TPPA    Collection Time: 11/09/21  5:18 PM   Result Value Ref Range    Syphilis Screen Neg     Syphilis Status Nonreact Nonreactive   Hepatitis C antibody    Collection Time: 11/09/21  5:18 PM   Result Value Ref Range    Hep C Ab NEG NEG   Hepatitis B surface antigen    Collection Time: 11/09/21  5:18 PM   Result Value Ref Range    HBV S Ag NEG NEG   HIV 1&2 antigen/antibody    Collection Time: 11/09/21  5:18 PM   Result Value Ref Range    HIV 1&2 ANTIGEN/ANTIBODY Nonreactive Nonreactive   Rubella antibody, IgG    Collection Time: 11/09/21  5:18 PM   Result Value Ref Range    Rubella IgG AB Negative    Type and screen    Collection Time: 11/09/21  5:18 PM  Result Value Ref Range    ABO RH Blood Type A RH POS     Antibody Screen Negative     BB Performing Lab [facility]    Confirmatory ABORH (by request from Blood Bank only)    Collection Time: 11/09/21  5:18 PM   Result Value Ref Range    Confirmatory ABORH A RH POS    Urinalysis with Microscopic UA    Collection Time: 11/09/21  5:19 PM   Result Value Ref Range    Color, UA Dk Yellow Yellow-Dk Yellow    Appearance,UR Clear Clear    Specific Gravity,UA 1.036 (H) 1.002 - 1.030    Leuk Esterase,UA 1+ (!) NEGATIVE    Nitrite,UA NEG NEGATIVE    pH,UA 6.0 5.0 - 8.0    Protein,UA 1+ (!) NEGATIVE    Glucose,UA NEG NEGATIVE    Ketones, UA Trace (!) NEGATIVE    Blood,UA NEG NEGATIVE    RBC,UA 0-2 0 - 2 /hpf    WBC,UA 21-50 (!) 0 - 5 /hpf    Bacteria,UA None Seen None Seen - 1+    Hyaline Casts,UA 0-5 0 - 5 /lpf    Squam Epithel,UA 2+ (!) 0-1+ /lpf    Mucus,UA Present Not Present   Aerobic culture    Collection Time: 11/09/21  5:19 PM    Specimen: Urine (Clean catch, voided, midstream)   Result Value Ref Range    Aerobic Culture .    Hold gray    Collection Time: 11/09/21  5:35 PM   Result Value Ref Range    Hold Grey HOLD TUBE    Vaginitis screen: DNA probe    Collection Time: 11/09/21 10:49 PM     Specimen: Vagina   Result Value Ref Range    Vaginitis Screen:DNA Probe .    Chlamydia plasmid DNA amplification    Collection Time: 11/09/21 10:50 PM    Specimen: Cervix   Result Value Ref Range    Chlamydia Plasmid DNA Amplification .    N. Gonorrhoeae DNA amplification    Collection Time: 11/09/21 10:50 PM    Specimen: Cervix   Result Value Ref Range    N. gonorrhoeae DNA Amplification .    Trichomonas DNA amplification    Collection Time: 11/09/21 10:50 PM    Specimen: Cervix   Result Value Ref Range    Trichomonas DNA amplification Positive for Trichomonas vaginalis Nucleic Acid (!)    COVID-19 PCR 0 Day    Collection Time: 11/10/21 12:01 AM   Result Value Ref Range    COVID-19 Source Nasopharyngeal     COVID-19 PCR NEGATIVE NEG   Drug screen chemical dependency, urine    Collection Time: 11/10/21 12:01 AM   Result Value Ref Range    Amphetamine,UR NEG     Cocaine/Metab,UR POS     Benzodiazepinen,UR NEG     Opiates,UR NEG     THC Metabolite,UR NEG     Fentanyl, UR NEG ng/mL    Remark,UR See Text    Buprenorphine, UR SCRN    Collection Time: 11/10/21 12:01 AM   Result Value Ref Range    Buprenorphine, UR SCRN NEG ng/mL   Cocaine, urine, confirmation    Collection Time: 11/10/21 12:01 AM   Result Value Ref Range    Confirm COC/METAB POS    Performing Lab    Collection Time: 11/10/21 12:01 AM   Result Value Ref Range    Performing Lab see below    Lead, blood  Collection Time: 11/10/21  9:22 AM   Result Value Ref Range    Lead Test Reason Initial    Lead venous    Collection Time: 11/10/21  9:22 AM   Result Value Ref Range    Lead,Venous <1.0 0.0 - 4.9 ug/dl   Aerobic culture    Collection Time: 11/10/21  5:31 PM    Specimen: Hand; LEFT   Result Value Ref Range    Aerobic Culture Staphylococcus aureus (!)     Aerobic Culture Corynebacterium striatum group (!)    Gram stain    Collection Time: 11/10/21  5:31 PM    Specimen: Hand; LEFT   Result Value Ref Range    Gram Stain Gram positive cocci in pairs (!)      Gram Stain Gram positive bacilli (!)    Unable to Perform Add-On Testing    Collection Time: 11/10/21  9:26 PM   Result Value Ref Range    Unable to Perform Add-on Testing `     / CIWA-Ar Total: 3     Assessment: 30 F     Final diagnoses:   Anxiety disorder, unspecified type   Cocaine use complicating pregnancy   History of psychosis   History of posttraumatic stress disorder (PTSD)   Dissociative disorder - Ruling out   Alcohol abuse     Plan:  Legal Status: {CPEP Legal Status:37599}  ***    Reasons for Continued Stay: {rsns for cont'd stay psych:30430002}    Author: Val Eagle, NP  as of: 11/11/2021  at: 3:53 PM

## 2021-11-12 DIAGNOSIS — F1490 Cocaine use, unspecified, uncomplicated: Principal | ICD-10-CM | POA: Diagnosis present

## 2021-11-12 DIAGNOSIS — O99320 Drug use complicating pregnancy, unspecified trimester: Secondary | ICD-10-CM | POA: Diagnosis present

## 2021-11-12 LAB — COVID-19 NAAT (PCR): COVID-19 NAAT (PCR): NEGATIVE

## 2021-11-12 LAB — PERFORMING LAB

## 2021-11-12 MED ORDER — RISPERIDONE 1 MG PO TBDP *I*
1.0000 mg | ORAL_TABLET | Freq: Every day | ORAL | Status: DC | PRN
Start: 2021-11-12 — End: 2021-11-15
  Administered 2021-11-14 (×2): 1 mg via ORAL
  Filled 2021-11-12 (×2): qty 1

## 2021-11-12 NOTE — CPEP Notes (Signed)
Report given to [id number].

## 2021-11-12 NOTE — Plan of Care (Signed)
Bacterial cx growing staph aureus and corynebacterium.  Recommend continuing current antibiotics, can adjust accordingly based on sensitivities.  Patient can call our office [phone]) to schedule a follow up visit within a month upon her discharge.  Thank you.

## 2021-11-12 NOTE — CPEP Notes (Addendum)
Pt has been calm and cooperative with staff all shift. Pt was able to let their needs be known. Pt appeared to be sleeping throughout the shift with no acute distress noted. Pt woke up requesting a shower. With permission of Investment banker, operational brought pt over to EOB, gave pt supplies for a shower, as well as new scrubs. Pt was walked back over to Adult milieu after shower. Q15 safety checks and comfort measures maintained.

## 2021-11-12 NOTE — ED Notes (Signed)
11/12/21 0006   CIWA-Ar   Nausea and Vomiting 1   Tremor 1   Paroxysmal Sweats 0   Anxiety 0   Agitation 0   Tactile Disturbances 0   Auditory Disturbances 0   Visual Disturbances 0   Headache, Fullness in Head 0   Orientation and Clouding of Sensorium 0   CIWA-Ar Total 2

## 2021-11-12 NOTE — CPEP Notes (Signed)
Collateral Note    Elissa Hefty, LMSW [facility] spoke with SW who reports being familiar with patient. States that patient is intermittently engaged with ob care. Reports being in communication with [employer] and working on securing outpatient MH treatment as well as care management referral.       Feliberto Harts, LMSW  11/12/2021  2:18 PM
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
## 2021-11-13 DIAGNOSIS — Z3490 Encounter for supervision of normal pregnancy, unspecified, unspecified trimester: Secondary | ICD-10-CM

## 2021-11-13 DIAGNOSIS — R21 Rash and other nonspecific skin eruption: Secondary | ICD-10-CM | POA: Diagnosis present

## 2021-11-13 DIAGNOSIS — A4901 Methicillin susceptible Staphylococcus aureus infection, unspecified site: Secondary | ICD-10-CM | POA: Diagnosis present

## 2021-11-13 DIAGNOSIS — F23 Brief psychotic disorder: Secondary | ICD-10-CM

## 2021-11-13 DIAGNOSIS — F1490 Cocaine use, unspecified, uncomplicated: Secondary | ICD-10-CM

## 2021-11-13 DIAGNOSIS — O99320 Drug use complicating pregnancy, unspecified trimester: Secondary | ICD-10-CM

## 2021-11-13 LAB — AEROBIC CULTURE

## 2021-11-13 MED ORDER — ESCITALOPRAM OXALATE 20 MG PO TABS *I*
20.0000 mg | ORAL_TABLET | Freq: Every day | ORAL | Status: DC
Start: 2021-11-14 — End: 2021-11-15
  Administered 2021-11-14 – 2021-11-15 (×2): 20 mg via ORAL
  Filled 2021-11-13 (×2): qty 1

## 2021-11-13 MED ORDER — ESCITALOPRAM OXALATE 10 MG PO TABS *I*
10.0000 mg | ORAL_TABLET | Freq: Once | ORAL | Status: AC
Start: 2021-11-13 — End: 2021-11-13
  Administered 2021-11-13: 10 mg via ORAL
  Filled 2021-11-13: qty 1
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
## 2021-11-13 NOTE — Progress Notes (Signed)
[id number]  Pt received in room sleeping.  She slept all night till coffee time  No PRN required during this period.

## 2021-11-13 NOTE — Plan of Care (Signed)
Problem: Risk for Suicide  Goal: Patient Will Be Safe And Free From Injury Throughout Hospitalization  Description: This goal applies for the duration of hospitalization  Outcome: Maintaining  Goal: Patient Will Express Sense Of Hope And Future Orientation By Discharge  Outcome: Maintaining   11/13/21 [id number] Patient arrived on unit at 0030 via wheelchair with nursing staff. Patient was oriented to unit. Patient denied any SI/HI ideations. VS WNL. Mips paged. Patient was cooperative and pleasant with admission process.
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
## 2021-11-13 NOTE — Continuity of Care (Signed)
[facility] EMERGENCY HOUSING CHECKLIST     Please review for Emergency Housing Protocol and call Emergency Housing [phone]) for clearance prior to discharging patient to [facility].  (Fax: [phone])    Date: 11/13/21                Anticipated Date for Discharge: 11/14/2021  Housing worker taking call:     Patient Name: Alexis Vang  Date of Birth: [birth year]  Social Security # or CIN (Required): [id number]    Place of residence prior to hospitalization: [facility]     Reason for homelessness: Patient's case was closed out due to being in hospital.     Social Worker making referral: Ray Church, MSW  Phone: [phone] Pager: [phone], Pager ID#:  Fax: [phone]    Reason for hospitalization: Patient in need of support with pregnancy and housing.   1.  Has the patient historically demonstrated hostile/abusive behavior    (verbal and/or physical)? no   2. Is patient rational and lucid, demonstrating coherent thought and conversation? yes   3. Is patient oriented to place, person, time and situtation? yes   4. Is patient mentally and emotionally competent and stable? yes   5. Is patient able to handle his/her own business affairs? yes        If not, does patient have a guardian, rep payee or case management?  No   6.  Is the patient incontinent? no       7.  Does the patient have difficulty dressing, grooming, bathing, or feeding him/her  self? no      8.  Does the patient have difficulty walking or doing stairs independently? no   9.  Does the patient utilize any assisted devices? no     10.  Is the patient dependent on the use of assisted device? no    11.  Does the patient require wound care? no      12.  Will the patient have home care nursing? Please have all services to meet daily  needs pre-arranged as necessary and documented on instruction sheet. no    13. Is the patient on any physical/mental health medications? Please indicate medication and condition for its use on discharge instruction sheet. Please  provide patient with enough Medication until the next business day. yes      14. Does the patient have follow-up appointments scheduled? Please indicate place, date and time on discharge instruction sheet. yes    15.  Is patient involved with case management/formal support services? Please indicate name/number on discharge instruction sheet. no    **Please provide the client with a meal to go**

## 2021-11-13 NOTE — Progress Notes (Signed)
11/13/21 0946   [facility] Patient Class Review   Patient Class Review Inpatient   Patient class effective 11/12/21    Will Bonnet, RN  Behavioral Health  Utilization Review  Pager# [phone]

## 2021-11-13 NOTE — Plan of Care (Signed)
Problem: Risk for Suicide  Goal: Patient Will Be Safe And Free From Injury Throughout Hospitalization  Description: This goal applies for the duration of hospitalization  Outcome: Maintaining  Goal: Patient Will Express Sense Of Hope And Future Orientation By Discharge  Outcome: Maintaining     Problem: Disturbed Thought Process and/or Sensory Perception  Goal: Patient Will Discuss Signs/Symptoms Of Illness Throughout Hospitalization  Outcome: Maintaining    Evening 11/13/2021.    Pt denies SI/HI/VAH.  Cooperative on contact.  Thought process is sequential on brief contacts but seems to get disorganized on prolonged contact.  During assessment when writer asked pt if she had any pain, pt responded "no" but said "I just feel like I have something in my stomach." When writer responded "yes, you are pregnant" pt replied "oh, am I?" Am not sure if this was a joke or if she really didn't know.  Then she later told writer she was 7 months pregnant.  Visible in the milieu interacting well with others.  Received PRN Benadryl X 2 for reported agitation and later for sleep.

## 2021-11-13 NOTE — Plan of Care (Addendum)
11/13/21  0830 - 1630  Medication compliance or PRN usage: Compliant with scheduled medications    Mood/affect:  Dysphoric, appropriate    Safety: Denies SI/HI/AVH    Thought Process:  Circumstantial     Milieu presence and engagement:  Pt present in the milieu, interacts with staff and select peers    Overall presentation:  Pt pleasant and cooperative, denies SI/HI/AVH, compliant with scheduled medications.  Pt with dry cracked skin over her hands, compliant with ointments for this.

## 2021-11-13 NOTE — Discharge Instructions (Addendum)
Psychiatrist: Charna Elizabeth, MD  NP: Quitman Livings, NP  Social Worker: Ray Church, MSW    Discharge Destination:  You are being discharged to [facility], located at [address] [place], [place], [place] [address] via [employer].     Brief Summary of Your Hospital Course (including key procedures and diagnostic test results):  You were admitted to the hospital after presenting disorganized and with multiple psychosocial stressors.  During this admission you were treated with Lexapro 20 mg daily to help improve your mood.  You now report feeling better and you are currently stable for discharge from the acute care setting.    Information regarding Supportive Referrals and Follow-up care:     Mental Health: You have an intake appointment at the [facility] on 11/15/2021 at 5:00pm, located at [address], Entrance at [address]  [place], [place] [address].     Recommendations: Diet: Regular - No restrictions  Activity: activity as tolerated    Triamcinolone cream - Apply topically to rash on body 2 times daily for 14 days.  Stop after rash resolves, do not use for more than 2 weeks    Betamethasone cream - Apply topically as a thin film to hands 2 times daily.  Use for up to 2 weeks, can stop when rash resolves.     The clinical team recommends the following actions for creating a safe environment post discharge: utilize options as identified in completed safety plan , reach out to family and/or friends for support , and take medications as prescribed     You are being discharged with: Medications filled at [facility]    When to call for help if experiencing any troubling symptoms including thoughts to harm yourself or others or with any other questions about your discharge plan or medications please call:  Outpatient MH provider [facility]  Phone # [phone]    Lifeline/Mobile Crisis Team: (24/7) [phone], [phone] (Out of [place])  [phone]    Suicide & Crisis Lifeline: 988    Level of Outreach Indicated If Patient Fails to Attend Scheduled Mental Health Appointment: routine program follow up    Was the patient provided a printed copy of their After Visit Summary:   Yes

## 2021-11-14 ENCOUNTER — Telehealth: Payer: Self-pay

## 2021-11-14 ENCOUNTER — Other Ambulatory Visit: Payer: Self-pay

## 2021-11-14 DIAGNOSIS — F331 Major depressive disorder, recurrent, moderate: Secondary | ICD-10-CM

## 2021-11-14 LAB — N. GONORRHOEAE NAAT (PCR)

## 2021-11-14 LAB — CHLAMYDIA NAAT (PCR)

## 2021-11-14 MED ORDER — PRENATAL PLUS IRON 29-1 MG PO TABS *I*
1.0000 | ORAL_TABLET | Freq: Every day | ORAL | 0 refills | Status: DC
Start: 2021-11-15 — End: 2022-04-12
  Filled 2021-11-14: qty 30, 30d supply, fill #0

## 2021-11-14 MED ORDER — METRONIDAZOLE 500 MG PO TABS *I*
500.0000 mg | ORAL_TABLET | Freq: Two times a day (BID) | ORAL | 0 refills | Status: AC
Start: 2021-11-14 — End: 2021-11-16
  Filled 2021-11-14: qty 3, 2d supply, fill #0

## 2021-11-14 MED ORDER — RISPERIDONE 1 MG PO TABS *I*
1.0000 mg | ORAL_TABLET | Freq: Every day | ORAL | 0 refills | Status: DC | PRN
Start: 2021-11-14 — End: 2022-01-21
  Filled 2021-11-14: qty 30, 30d supply, fill #0

## 2021-11-14 MED ORDER — BETAMETHASONE DIPROPIONATE 0.05 % EX CREA *I*
TOPICAL_CREAM | Freq: Two times a day (BID) | CUTANEOUS | 0 refills | Status: DC
Start: 2021-11-14 — End: 2022-01-21
  Filled 2021-11-14: qty 15, 14d supply, fill #0

## 2021-11-14 MED ORDER — TRIAMCINOLONE ACETONIDE 0.1 % EX CREA *I*
TOPICAL_CREAM | Freq: Two times a day (BID) | CUTANEOUS | 0 refills | Status: AC
Start: 2021-11-14 — End: 2021-11-28
  Filled 2021-11-14: qty 15, 14d supply, fill #0

## 2021-11-14 MED ORDER — ESCITALOPRAM OXALATE 20 MG PO TABS *I*
20.0000 mg | ORAL_TABLET | Freq: Every day | ORAL | 0 refills | Status: DC
Start: 2021-11-15 — End: 2022-03-06
  Filled 2021-11-14: qty 30, 30d supply, fill #0

## 2021-11-14 MED ORDER — CEPHALEXIN 500 MG PO CAPS *I*
500.0000 mg | ORAL_CAPSULE | Freq: Two times a day (BID) | ORAL | 0 refills | Status: DC
  Filled 2021-11-14: qty 3, 2d supply, fill #0

## 2021-11-14 NOTE — Progress Notes (Signed)
[id number]  Pt stayed up all night listening to radio and interacting with Pt SL, both Pt were disruptive to the milieu all night requiring frequent redirections.  Pt barely slept for one hour.  Denies cramps/vaginal bleeding

## 2021-11-14 NOTE — Continuity of Care (Signed)
[facility] SOCIAL WORK  PHARMACY FORM     Todays date:  November 14, 2021    Patient Name: Alexis Vang      Medical Record #: [id number]   DOB: 1991/04/23  Patients Address: [address] [place]              Social Worker: Ray Church, MSW       Date of Service: November 14, 2021       Funding Source: SW Medication Assistance Fund  ___________________________________________________________________    Pharmacy Information:  Date/time sent: November 14, 2021     Time needed: asap    Patient Location: Inpatient (specify unit)  [id number]    Medication Pick-up Preference: Please tube to station # [id number] when ready    Pharmacy Contact: Deloris Ping  Social work signature: Ray Church, MSW  Supervisor/Manager Approval (if indicated):  Date:  (supervisor signature not required for Medicaid pending)

## 2021-11-14 NOTE — Progress Notes (Signed)
Psychiatric Daily Progress Note    I saw and examined this patient in the presence of SW today. I reviewed nurses' notes as well as all other notes, lab tests and reports made during the last 24 hours. I discussed the case with members of the treatment team.    HPI: (Comment on the development of the patient's present illness from the previous encounter) (Level 1 &2: 1 - 3 Elements, Level 3: 4 Elements)    Pt denies SI/HI/AH/VH.  Concerned that we are going to mix her up with her sister "[name]:, does not acknowledge that in the past she has used the name "[name]", stating "I'm [name]."   Tolerating meds well.  Looking forward to discharge.    Review of Systems (Level 2: 1 System, Level 3:  2-9 Systems):    Review of Systems   Constitutional: Negative.    Musculoskeletal: Negative for arthralgias and myalgias.       Mental Status Exam - Psych. Exam (Level 2&3: 2-7 Elements of the MSE):    PSYCH EXAM:     Constitutional  Appearance:  Dressed appropriately and no acute distress  Vital Signs: BP 103/59 (BP Location: Left arm)    Pulse 98    Temp 36.9 C (98.4 F) (Temporal)    Resp 16    Ht 1.524 m (5')    Wt 54.4 kg (120 lb)    SpO2 100%    BMI 23.44 kg/m     Musculoskeletal  Station/ Gait:  Normal    Psychiatric  Psychomotor Activity:  Normal  Abnormal Movements:  None  Relationship to Interviewer:  Engages well and cooperative  Speech:  Normal  Language:  Normal comprehension  Mood:  Dysphoric  Affect:  Constricted  Thought Process:  Sequential and linear/ goal directed  Thought Content:  No suicidal ideation and no homicidal ideation  Cognition:  Fair attention span  Perceptions/ Associations:  No hallucinations  Insight:  Fair  Judgement:  Fair  Sensorium:  Alert, oriented to person and oriented to place         Physical Exam (if applicable):    Physical Exam  Vitals and nursing note reviewed.         Scheduled Meds:   escitalopram  20 mg Oral Daily    prenatal plus iron  1 tablet Oral Daily    cephalexin  500  mg Oral 2 times per day    mupirocin   Topical 2 times per day    triamcinolone   Topical 2 times per day    metroNIDAZOLE  500 mg Oral BID     Continuous Infusions:  PRN Meds:.risperiDONE, acetaminophen, aluminum & magnesium hydroxide w/simethicone, magnesium hydroxide, diphenhydrAMINE         Reason(s) for continued stay: Stabilization of gains and Need for safe discharge    Labs   All labs in the last 24 hours: No results found for this or any previous visit (from the past 24 hour(s)).    Diagnostic Impression  Active Problems:    Cocaine use complicating pregnancy    Staph aureus infection    Pregnancy    Rash of hands    Assessment/Plan:    Continue meds as prescribed  Plan to send to DSS in the morning for housing placement    Greater than 50 % of time with patient was spent with counseling / coordination of care. Time spent: na  Author: Quitman Livings, NP  as of: 11/14/2021  at: 9:48 AM

## 2021-11-14 NOTE — Plan of Care (Addendum)
Problem: Risk for Suicide  Goal: Patient Will Be Safe And Free From Injury Throughout Hospitalization  Description: This goal applies for the duration of hospitalization  Outcome: Progressing towards goal     Problem: Risk for Suicide  Goal: Patient Will Express Sense Of Hope And Future Orientation By Discharge  Outcome: Progressing towards goal     Problem: Disturbed Thought Process and/or Sensory Perception  Goal: Patient Will Discuss Signs/Symptoms Of Illness Throughout Hospitalization  Outcome: Progressing towards goal   11/14/21 8am to 4:30 pm to 8:30pm     This patient has been seclusive to her room, medication compliant. Cooperative and pleasant with Clinical research associate. Offers no major complaints. Took part in afternoon activity. No safety concerns at this time. Early evening ate supper, making frequent request for ipad and snacks,

## 2021-11-14 NOTE — Telephone Encounter (Signed)
nurse called to schedule appt for patient.    explain we need a referral to  Schedule patients.    she stated there are note on file from 11/12/21.   adv i see note for dr Stephenie Acres who was the Er doctor.   advised no note on file from  11/12/21  regarding patient needs to be seen  a month of after discharge.       nurse stated she will page Dr to ahve referral sent.
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
## 2021-11-15 DIAGNOSIS — F22 Delusional disorders: Secondary | ICD-10-CM

## 2021-11-15 DIAGNOSIS — F4310 Post-traumatic stress disorder, unspecified: Secondary | ICD-10-CM
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
## 2021-11-15 NOTE — Continuity of Care (Signed)
[facility] Behavioral Health Safety Plan     Step 1: Warning signs (thoughts, images, feelings, behaviors) that a crisis may be developing:  Breathing hard  Clenching fists  Feeling warm  Hurting yourself  Isolating/avoiding behavior  Loud voice  Lying  Not following directions  Not taking care of self  Pacing  Racing heartbeat  Sweating    Step 2: Internal coping strategies - Things I can do to take my mind off my problems without contacting another person (distracting & calming activities):  being around others, being in a dark/quiet place, being in a quiet place/taking space, breathing exercises, coloring/drawing, crying, having a hug, hugging a stuffed animal, listening to music, meditation/yoga, praying/meditation, snuggling in a blanket, taking space, watching TV and writing down my feelings    Step 3: People and social settings that provide distraction:   Patient noted having no social supports    Step 4: People whom I can ask for help:  Therapist/Counselor: [facility]    Step 5: Professionals or agencies I can contact during a crisis:  1.  Clinician Name: [facility]  Phone: [phone]   Clinician Emergency Contact #:   2.  Clinician Name:   Phone:    Clinician Emergency Contact #:   3.  Local Urgent Care or Emergency Room  4.       [facility] CRISIS CALL CENTER: [phone]  5.  [place] CRISIS: [phone]  6.  [place]ATIONAL SUICIDE PREVENTION [place]: 988  7.  POLICE: 911    Step 6: Making the environment safe (removing or limiting access to lethal means):  removing illicit substance and/or alcohol from home and medication monitoring/lock up medications    The one thing that is most important to me and worth living for is: my unborn child         Duffy Rhody and Corporate investment banker (2012)
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
## 2021-11-15 NOTE — Discharge Summary (Signed)
Discharge: Pt was discharged at 1305. Denies current SI/HI/AVH. Mood/affect is calm but slightly irritable which she attributed to the stress of discharge but anxious to go. Pt is being discharged home, by taxi with staff accompanied. Discharge paperwork was reviewed with pt including medications regime and future appointments. Pt is agreeable to discharge plan and was able to provide teach back for all discharge instructions. Pt given all belongings and meds from pharmacy prior to discharge. Discharged wearing appropriate clothing for the weather.

## 2021-11-15 NOTE — Plan of Care (Signed)
Problem: Risk for Suicide  Goal: Patient Will Be Safe And Free From Injury Throughout Hospitalization  Description: This goal applies for the duration of hospitalization  Outcome: Maintaining  Goal: Patient Will Express Sense Of Hope And Future Orientation By Discharge  Outcome: Maintaining     Problem: Disturbed Thought Process and/or Sensory Perception  Goal: Patient Will Discuss Signs/Symptoms Of Illness Throughout Hospitalization  Outcome: Maintaining   Patient was received alert but appears disorganized. She was compliant with medication. She has been in her room sleeping. Will continue to care.
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
## 2021-11-18 ENCOUNTER — Other Ambulatory Visit
Admission: RE | Admit: 2021-11-18 | Discharge: 2021-11-18 | Disposition: A | Discharge status: Home / Self Care | Payer: Medicaid Other | Source: Ambulatory Visit | Attending: Obstetrics and Gynecology | Admitting: Obstetrics and Gynecology

## 2021-11-18 ENCOUNTER — Other Ambulatory Visit: Payer: Self-pay | Admitting: Obstetrics and Gynecology

## 2021-11-18 ENCOUNTER — Observation Stay
Admission: AD | Admit: 2021-11-18 | Discharge: 2021-11-19 | Disposition: A | Discharge status: Home / Self Care | Payer: Medicaid Other | Source: Ambulatory Visit | Attending: Obstetrics and Gynecology | Admitting: Obstetrics and Gynecology

## 2021-11-18 ENCOUNTER — Encounter: Payer: Self-pay | Admitting: Gastroenterology

## 2021-11-18 ENCOUNTER — Emergency Department
Admission: EM | Admit: 2021-11-18 | Discharge: 2021-11-18 | Discharge status: Against Medical Advice | Payer: Medicaid Other | Source: Ambulatory Visit | Attending: Emergency Medicine | Admitting: Emergency Medicine

## 2021-11-18 DIAGNOSIS — F99 Mental disorder, not otherwise specified: Secondary | ICD-10-CM | POA: Insufficient documentation

## 2021-11-18 DIAGNOSIS — Z3490 Encounter for supervision of normal pregnancy, unspecified, unspecified trimester: Secondary | ICD-10-CM

## 2021-11-18 DIAGNOSIS — Z5321 Procedure and treatment not carried out due to patient leaving prior to being seen by health care provider: Secondary | ICD-10-CM | POA: Insufficient documentation

## 2021-11-18 DIAGNOSIS — Z3A24 24 weeks gestation of pregnancy: Secondary | ICD-10-CM | POA: Insufficient documentation

## 2021-11-18 DIAGNOSIS — O99342 Other mental disorders complicating pregnancy, second trimester: Secondary | ICD-10-CM | POA: Insufficient documentation

## 2021-11-18 DIAGNOSIS — R109 Unspecified abdominal pain: Secondary | ICD-10-CM | POA: Insufficient documentation

## 2021-11-18 DIAGNOSIS — O26892 Other specified pregnancy related conditions, second trimester: Principal | ICD-10-CM | POA: Insufficient documentation

## 2021-11-18 DIAGNOSIS — L853 Xerosis cutis: Secondary | ICD-10-CM | POA: Insufficient documentation

## 2021-11-18 DIAGNOSIS — Z59 Unhousedness unspecified: Secondary | ICD-10-CM | POA: Insufficient documentation

## 2021-11-18 DIAGNOSIS — N898 Other specified noninflammatory disorders of vagina: Secondary | ICD-10-CM

## 2021-11-18 DIAGNOSIS — O0992 Supervision of high risk pregnancy, unspecified, second trimester: Secondary | ICD-10-CM

## 2021-11-18 DIAGNOSIS — Z0189 Encounter for other specified special examinations: Secondary | ICD-10-CM | POA: Insufficient documentation

## 2021-11-18 LAB — URINALYSIS REFLEX TO CULTURE
Blood,UA: NEGATIVE
Glucose,UA: NEGATIVE
Nitrite,UA: NEGATIVE
Specific Gravity,UA: 1.037 — ABNORMAL HIGH (ref 1.002–1.030)
pH,UA: 6.5 (ref 5.0–8.0)

## 2021-11-18 LAB — URINE MICROSCOPIC (IQ200): Bacteria,UA: NONE SEEN

## 2021-11-18 MED ORDER — AQUAPHOR EX OINT *I*
TOPICAL_OINTMENT | Freq: Two times a day (BID) | CUTANEOUS | Status: DC
Start: 2021-11-19 — End: 2021-11-19
  Filled 2021-11-18: qty 50

## 2021-11-18 NOTE — OB Triage Note (Signed)
7:39 PM Received patient here for abdominal pain and milky vaginal discharge. Patient reports + fetal movement today. Abdomen palpates soft. Patient denies gush of fluid or vaginal bleeding. Patient emotional, states she is worried about being a high risk pregnancy and having a place to stay.

## 2021-11-18 NOTE — Progress Notes (Signed)
Report from a gallagher rn at this time. Writer assumes care. Social work unable to place pt overnight. Plan is for pt to stay in triage for morning social work resources. Pt aware of plan of care. Call bell within reach.
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
## 2021-11-18 NOTE — Procedures (Addendum)
Fetal Non-Stress Test    Patient: [name] Meyering Age: 30 y.o.  Date of Birth: 1991-10-12  [id number]  eMRN: [id number]  GA: [redacted]w[redacted]d  Maternal  Heart Rate: 81 (11/18/21 2131)  BP: 103/64 (11/18/21 2131)    Reason For Exam: vaginal discharge  Education Done:  External fetal monitoring Fetal kick counts NST      Fetal non-stress test for singleton pregnancy  Pre Procedure Diagnosis: Encounter for supervision of normal pregnancy in second trimester  Post Procedure Diagnosis: NST (non-stress test) reactive            NST Start Time:  11/18/2021 8:37 PM            Uterine Irritability: No            Contractions: rare    NST Fetus A  Variability: moderate  FHR Baseline: 125  Decelerations: none   Accelerations: at least 10 BPM for 10 seconds  Stimulation: none  NST Interpretation: reactive    End Time:  11/18/2021 9:31 PM          Duration of test (min): 54  Location of NST Fetal Heart Tracing: Archived electronically  Reviewed with:  Dr.Harrington        Test performed By: Pamala Hurry, RN  11/18/2021 9:40 PM

## 2021-11-18 NOTE — Progress Notes (Addendum)
Social Work Progress Note:  Notified by [employer] that patient presented to [id number] via EMS due to abdominal pain and milky discharge; she is [redacted] weeks pregnant. Patient needs assistance with housing. She is medically ready for discharge.     Per chart review patient was placed at [facility] last week (11/15/21). SW called [facility] [phone]); they do not have patient on their roster. SW then called [employer] After-Hours [phone]); they do not have any shelter beds available tonight; referred patient to [facility]. SW called [employer] [phone]) and learned they are full this evening.     SW updated nursing-Abby on SW efforts.     Plan: SW unable to secure housing for patient tonight   If patient has friends/family she can stay with, patient can be discharged there    If patient remains admitted through tonight, [place] SW can revisit housing options in the morning    Hilton Cork, LMSW  Emergency Department Social Work   [phone]

## 2021-11-19 ENCOUNTER — Ambulatory Visit: Payer: Medicaid Other

## 2021-11-19 LAB — VAGINITIS SCREEN: DNA PROBE: Vaginitis Screen:DNA Probe: POSITIVE — AB

## 2021-11-19 MED ORDER — ESCITALOPRAM OXALATE 20 MG PO TABS *I*
20.0000 mg | ORAL_TABLET | Freq: Every day | ORAL | Status: DC
Start: 2021-11-19 — End: 2021-11-19
  Filled 2021-11-19 (×2): qty 1

## 2021-11-19 MED ORDER — RISPERIDONE 1 MG PO TABS *I*
1.0000 mg | ORAL_TABLET | Freq: Every day | ORAL | Status: DC | PRN
Start: 2021-11-19 — End: 2021-11-19
  Filled 2021-11-19: qty 1

## 2021-11-19 MED ORDER — ASPIRIN 81 MG PO TBEC *I*
162.0000 mg | DELAYED_RELEASE_TABLET | Freq: Every day | ORAL | Status: DC
Start: 2021-11-19 — End: 2021-11-19
  Filled 2021-11-19 (×2): qty 2

## 2021-11-19 MED ORDER — PRENATAL PLUS IRON 29-1 MG PO TABS *I*
1.0000 | ORAL_TABLET | Freq: Every day | ORAL | Status: DC
Start: 2021-11-19 — End: 2021-11-19

## 2021-11-19 NOTE — Progress Notes (Addendum)
SW contacted for bus passes for pt. SW told staff we will tube some bus passes to tube station #63.     Quentin Angst, MSW   [facility] Emergency Department Social Worker   Ext. [phone]

## 2021-11-19 NOTE — Progress Notes (Signed)
Social Work Note:

## 2021-11-19 NOTE — OB Triage Note (Signed)
Alexis Vang reports that she has a "family emergency" and cannot wait for SW regarding housing. She provided phone number of [phone] and said to "just call me" if housing is available. She promptly lett triage upon this announcement.    Note sent to [facility] as Alexis Vang missed her outpatient appt today (while in [facility] triage) and needs this appt rescheduled.  SW apprised that Alexis Vang left.    Donnalee Curry, CNM FNP-BC  11/19/2021  2:21 PM

## 2021-11-20 ENCOUNTER — Emergency Department
Admission: EM | Admit: 2021-11-20 | Discharge: 2021-11-21 | Disposition: A | Discharge status: Home / Self Care | Payer: Medicaid Other | Source: Ambulatory Visit | Attending: Psychiatry | Admitting: Psychiatry

## 2021-11-20 ENCOUNTER — Other Ambulatory Visit: Payer: Self-pay

## 2021-11-20 ENCOUNTER — Observation Stay
Admission: AD | Admit: 2021-11-20 | Discharge: 2021-11-20 | Disposition: A | Discharge status: Other Acute Inpatient Hospital | Payer: Medicaid Other | Source: Ambulatory Visit | Attending: Obstetrics and Gynecology | Admitting: Obstetrics and Gynecology

## 2021-11-20 ENCOUNTER — Telehealth: Payer: Self-pay

## 2021-11-20 ENCOUNTER — Encounter: Payer: Self-pay | Admitting: Student in an Organized Health Care Education/Training Program

## 2021-11-20 DIAGNOSIS — R443 Hallucinations, unspecified: Secondary | ICD-10-CM

## 2021-11-20 DIAGNOSIS — Y9389 Activity, other specified: Secondary | ICD-10-CM | POA: Insufficient documentation

## 2021-11-20 DIAGNOSIS — O99322 Drug use complicating pregnancy, second trimester: Secondary | ICD-10-CM

## 2021-11-20 DIAGNOSIS — Y9289 Other specified places as the place of occurrence of the external cause: Secondary | ICD-10-CM | POA: Insufficient documentation

## 2021-11-20 DIAGNOSIS — R45851 Suicidal ideations: Secondary | ICD-10-CM | POA: Insufficient documentation

## 2021-11-20 DIAGNOSIS — X58XXXA Exposure to other specified factors, initial encounter: Secondary | ICD-10-CM | POA: Insufficient documentation

## 2021-11-20 DIAGNOSIS — T43224A Poisoning by selective serotonin reuptake inhibitors, undetermined, initial encounter: Secondary | ICD-10-CM | POA: Insufficient documentation

## 2021-11-20 DIAGNOSIS — O99320 Drug use complicating pregnancy, unspecified trimester: Secondary | ICD-10-CM

## 2021-11-20 DIAGNOSIS — R9431 Abnormal electrocardiogram [ECG] [EKG]: Secondary | ICD-10-CM

## 2021-11-20 DIAGNOSIS — Y998 Other external cause status: Secondary | ICD-10-CM | POA: Insufficient documentation

## 2021-11-20 DIAGNOSIS — T7840XA Allergy, unspecified, initial encounter: Secondary | ICD-10-CM | POA: Insufficient documentation

## 2021-11-20 DIAGNOSIS — O0992 Supervision of high risk pregnancy, unspecified, second trimester: Secondary | ICD-10-CM

## 2021-11-20 DIAGNOSIS — R109 Unspecified abdominal pain: Secondary | ICD-10-CM

## 2021-11-20 DIAGNOSIS — O9A212 Injury, poisoning and certain other consequences of external causes complicating pregnancy, second trimester: Secondary | ICD-10-CM | POA: Insufficient documentation

## 2021-11-20 DIAGNOSIS — F1490 Cocaine use, unspecified, uncomplicated: Secondary | ICD-10-CM | POA: Insufficient documentation

## 2021-11-20 DIAGNOSIS — T454X2A Poisoning by iron and its compounds, intentional self-harm, initial encounter: Secondary | ICD-10-CM | POA: Insufficient documentation

## 2021-11-20 DIAGNOSIS — F29 Unspecified psychosis not due to a substance or known physiological condition: Secondary | ICD-10-CM

## 2021-11-20 DIAGNOSIS — F1990 Other psychoactive substance use, unspecified, uncomplicated: Secondary | ICD-10-CM

## 2021-11-20 DIAGNOSIS — F4310 Post-traumatic stress disorder, unspecified: Secondary | ICD-10-CM

## 2021-11-20 DIAGNOSIS — O99712 Diseases of the skin and subcutaneous tissue complicating pregnancy, second trimester: Secondary | ICD-10-CM | POA: Insufficient documentation

## 2021-11-20 DIAGNOSIS — O99342 Other mental disorders complicating pregnancy, second trimester: Secondary | ICD-10-CM | POA: Insufficient documentation

## 2021-11-20 DIAGNOSIS — F22 Delusional disorders: Secondary | ICD-10-CM | POA: Insufficient documentation

## 2021-11-20 DIAGNOSIS — T452X2A Poisoning by vitamins, intentional self-harm, initial encounter: Secondary | ICD-10-CM | POA: Insufficient documentation

## 2021-11-20 DIAGNOSIS — Z3A24 24 weeks gestation of pregnancy: Secondary | ICD-10-CM

## 2021-11-20 DIAGNOSIS — R21 Rash and other nonspecific skin eruption: Secondary | ICD-10-CM | POA: Insufficient documentation

## 2021-11-20 DIAGNOSIS — Z789 Other specified health status: Secondary | ICD-10-CM

## 2021-11-20 DIAGNOSIS — T43202A Poisoning by unspecified antidepressants, intentional self-harm, initial encounter: Secondary | ICD-10-CM

## 2021-11-20 DIAGNOSIS — Z5901 Sheltered unhousedness: Secondary | ICD-10-CM | POA: Insufficient documentation

## 2021-11-20 DIAGNOSIS — T43222A Poisoning by selective serotonin reuptake inhibitors, intentional self-harm, initial encounter: Secondary | ICD-10-CM

## 2021-11-20 LAB — CHLAMYDIA NAAT (PCR): Chlamydia Plasmid DNA Amplification: 0

## 2021-11-20 LAB — CBC AND DIFFERENTIAL
Baso # K/uL: 0 10*3/uL (ref 0.0–0.1)
Basophil %: 0.3 %
Eos # K/uL: 0.2 10*3/uL (ref 0.0–0.4)
Eosinophil %: 1.6 %
Hematocrit: 32 % — ABNORMAL LOW (ref 34–45)
Hemoglobin: 10.2 g/dL — ABNORMAL LOW (ref 11.2–15.7)
IMM Granulocytes #: 0.1 10*3/uL — ABNORMAL HIGH (ref 0.0–0.0)
IMM Granulocytes: 0.6 %
Lymph # K/uL: 2.5 10*3/uL (ref 1.2–3.7)
Lymphocyte %: 22.8 %
MCH: 31 pg (ref 26–32)
MCHC: 32 g/dL (ref 32–36)
MCV: 99 fL — ABNORMAL HIGH (ref 79–95)
Mono # K/uL: 1.2 10*3/uL — ABNORMAL HIGH (ref 0.2–0.9)
Monocyte %: 10.7 %
Neut # K/uL: 6.9 10*3/uL — ABNORMAL HIGH (ref 1.6–6.1)
Nucl RBC # K/uL: 0 10*3/uL (ref 0.0–0.0)
Nucl RBC %: 0 /100 WBC (ref 0.0–0.2)
Platelets: 248 10*3/uL (ref 160–370)
RBC: 3.3 MIL/uL — ABNORMAL LOW (ref 3.9–5.2)
RDW: 12.9 % (ref 11.7–14.4)
Seg Neut %: 64 %
WBC: 10.8 10*3/uL — ABNORMAL HIGH (ref 4.0–10.0)

## 2021-11-20 LAB — BASIC METABOLIC PANEL
Anion Gap: 11 (ref 7–16)
CO2: 21 mmol/L (ref 20–28)
Calcium: 8.9 mg/dL (ref 8.8–10.2)
Chloride: 102 mmol/L (ref 96–108)
Creatinine: 0.58 mg/dL (ref 0.51–0.95)
Glucose: 68 mg/dL (ref 60–99)
Lab: 12 mg/dL (ref 6–20)
Potassium: 4.1 mmol/L (ref 3.3–5.1)
Sodium: 134 mmol/L (ref 133–145)
eGFR BY CREAT: 124 *

## 2021-11-20 LAB — ACETAMINOPHEN LEVEL: Acetaminophen: <5 ug/mL

## 2021-11-20 LAB — ETHANOL: Ethanol: <10 mg/dL (ref 0–9)

## 2021-11-20 LAB — N. GONORRHOEAE NAAT (PCR): N. gonorrhoeae DNA Amplification: 0

## 2021-11-20 LAB — HOLD GREEN WITH GEL

## 2021-11-20 LAB — SALICYLATE LEVEL: Salicylate: <3 mg/dL — ABNORMAL LOW (ref 15.0–30.0)

## 2021-11-20 MED ORDER — ARTIFICIAL TEARS (POLYVINYL ALCOHOL) 1.4 % OP SOLN *I*
1.0000 [drp] | OPHTHALMIC | Status: DC | PRN
Start: 2021-11-20 — End: 2021-11-21
  Administered 2021-11-21: 1 [drp] via OPHTHALMIC
  Filled 2021-11-20: qty 15

## 2021-11-20 MED ORDER — DEXTROSE 5 % FLUSH FOR PUMPS *I*
0.0000 mL/h | INTRAVENOUS | Status: DC | PRN
Start: 2021-11-20 — End: 2021-11-21

## 2021-11-20 MED ORDER — DIPHENHYDRAMINE HCL 25 MG ORAL SOLID *WRAPPED*
25.0000 mg | Freq: Four times a day (QID) | ORAL | Status: DC | PRN
Start: 2021-11-20 — End: 2021-11-20
  Administered 2021-11-20: 25 mg via ORAL
  Filled 2021-11-20: qty 1

## 2021-11-20 MED ORDER — DIPHENHYDRAMINE HCL 25 MG ORAL SOLID *WRAPPED*
50.0000 mg | Freq: Every evening | ORAL | Status: DC | PRN
Start: 2021-11-20 — End: 2021-11-21
  Filled 2021-11-20: qty 2

## 2021-11-20 MED ORDER — TRIAMCINOLONE ACETONIDE 0.1 % EX CREA *I*
TOPICAL_CREAM | Freq: Two times a day (BID) | CUTANEOUS | Status: DC
Start: 2021-11-20 — End: 2021-11-21
  Administered 2021-11-20: 1 via TOPICAL
  Filled 2021-11-20: qty 15

## 2021-11-20 MED ORDER — SODIUM CHLORIDE 0.9 % FLUSH FOR PUMPS *I*
0.0000 mL/h | INTRAVENOUS | Status: DC | PRN
Start: 2021-11-20 — End: 2021-11-21

## 2021-11-20 NOTE — ED Triage Notes (Signed)
Pt brought down by 314, pt went there to be seen, disclosed to staff upstairs that she took an unknown amount of pills in order to harm herself.  Pt not forthcoming with information down here in triage.  Pt only states she had an allergic reaction and needs a tetanus shot.  According to staff pt told them she took 20, 20mg  tabs of escitalopram              Prehospital medications given: No

## 2021-11-20 NOTE — CPEP Notes (Signed)
CPEP Charge Nurse Note    Report received from: EMS RN, via EMS, voluntarily accompanied by patient alone. Pt with a PPHx of PTSD with disassociating symptoms, cocaine use during pregnancy, anxiety disorder. And alcohol use presents today for SA via taking too many meds varies between escitalopram and prenatals. See note FH:[id number] call denies drugs/alcohol.    Chief Complaint   Patient presents with    Ingestion    Suicidal       Allergies as of 11/20/2021    (No Known Allergies (drug, envir, food or latex))       Past Medical History:   Diagnosis Date    Anxiety     Asthma     Depression     Gestational diabetes     Preeclampsia     Scabies     Substance abuse      Substance use: denies    Ingestion: Denies    Medical clearance: Yes: medically cleared: treatment recieved cleared by OB    Vital signs:  Last Filed Vitals    11/20/21 0637   BP: 100/69   Pulse: 82   Resp: 14   Temp: 37.2 C (99 F)   SpO2: 99%     Last Nursing documented pain:  0-10 Scale: 4 (11/20/21 8832)    Pre-Arrival Notifications: No    Patient location: EMS    Willaim Rayas, RN, 7:22 AM
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
## 2021-11-20 NOTE — ED Notes (Signed)
Patient brought to ED from [facility] after disclosing that she had suicidal ideation. Pt told writer that she is endorsing SI/HI but does not have a plan for either. Endorsing chest pain, nausea, SOB, and chills. Writer overheard patient talking on the phone stating "I had to tell them I was suicidal so that I could talk to social work because I am pregnant and homeless".

## 2021-11-20 NOTE — CPEP Notes (Signed)
CPEP Clinical Evaluator Note    Patient seen by Thermon Leyland on 11/20/2021 at 1:52 PM.    Chief Complaint:     Chief Complaint   Patient presents with    Ingestion    Suicidal   .  The patient is here voluntarily, alone with the above chief complaint.    History of Present Illness:     HPI     Chief complaint (if different from above):       Presenting crisis / Chain of events leading to presentation (including precipitating factors and associated signs and symptoms):    Pt reported she is in CPEP because she has "nowhere else to go".Pt states she is homeless and was going to stay with "a man" but changed her mind once she was bitten by what she believes was a mouse or bug in the home. Pt came to CPEP believing she had an allergic reaction. When asked about SI, pt became irritated and said "I don't want to hurt myself or anyone else, there's no need to ask me that". Pt denied making any suicidal statements or ingestion. Pt reports she believes the nurse in the ED was trying to harm her baby by putting an IV in her hand. When asked why she believed this, she stated "the devil is busy".      Pertinent signs and symptoms for current crisis:  Patient presents with agitation. Onset of symptoms was abrupt starting 1 day ago.  Patient states symptoms have been exacerbated by lack of housing, financial strain, and lack of social supports. Relevant or contributing stressors include limited housing, lack of care with pregnancy.    Current Level of Functioning (as opposed to baseline functioning in settings such as work, school, family, etc.):    Pt reports she does not work and does not have any relationship with family or friends. 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 When asked  about mental health services, pt declined any referrals stating "I'm not going to let anyone else know about my life and tell me what to do". Pt stated she has not seen a primary doctor or OBGYN for her pregnancy, requesting to be connected to one.    Supports:    Pt was unable to identify any social supports.    Aggravating and alleviating factors (include triggers):    Pt identified aggravating factors as being homeless, lack of medical care, and financial strain. Pt identiifed alleviating factors as having "hope for the future and my baby".     Developments since arrival to CPEP (including changes in presenting complaints, patient's goals, interventions, disposition options explored):   Patient is minimally cooperative with the evaluation process and  engages with Probation officer.     Pt has denied SI/HI/AVH/SIB. Pt has denied substance use and declined CD referrals. Pt declined [place] referrals. Pt stated that she would like assistance with housing.  Collateral Contacts:     Personal:      Pt does not have any contacts listed and was unable to provide any information for someone to speak with.    Psychiatric History:     Pt was recently discharged from inpatient on 11/15/21.    Interventions:   Psych Eval, Psych History, Safety Plan, DIRA  and Additctive Behavior Screen                            Safety Plan:       [facility] Safety Plan     Step 1: Warning signs (thoughts, images, feelings, behaviors) that a crisis may be developing:  Isolating/avoiding behavior  crying, feeling frustrated/anxious, excessive worry    Step 2: Internal coping strategies - Things I can do to take my mind off my problems without contacting another person (distracting & calming activities):  Singing, talking to self and baby    Step 3: People and social settings that provide distraction:    Unable to identify    Step 4: People whom I can ask for help:  Unable to identify.    Step 5: Professionals or agencies I can contact during a  crisis:    1.  Local Urgent Care or Emergency Room  2.       [place]: [phone]  3.  [place] [facility]: [phone]  4.  [employer] [place]: 295  6.  POLICE: 213    Step 6: Making the environment safe (removing or limiting access to lethal means):  staying with family/friend, removing weapon or firearm from home, removing access to sharp objects, removing illicit substance and/or alcohol from home, medication monitoring/lock up medications, removing old medications no longer prescribed  and access to Narcan kit    The one thing that is most important to me and worth living for is: "My baby"         Dorothyann Peng and Owens Shark (2012)   Patient presentation, information, collateral and disposition options were reviewed and discussed with @CPEP  MD@    Thermon Leyland

## 2021-11-20 NOTE — Telephone Encounter (Signed)
Writer attempted to contact the patient to schedule next [facility] OBC. Patient is currently inpt. Writer will attempt to contact inpt team to schedule [facility] appts before discharge.

## 2021-11-20 NOTE — CPEP Notes (Signed)
CPEP Triage Note    Arrival    Patient is oriented to unit and CPEP evaluation process: Yes  Reviewed cell phone and visitor policies: yes   Reviewed contraband items/milieu safety concerns and confirmed no safety concerns present: yes    Patient is accompanied by: patient alone  Patient under MHT: No    History and Chief Complaint    Reason for current presentation: Pt is a 30 yr old female [redacted] weeks pregnant with h/o PTSD, psychosis, etoh and cocaine use who presents to the ED from [facility] triage endorsing having had a SA via ingestion of 3-4 (20 mg) escitalopram tablets and unknown quantity of prenatal vitamins. In triage pt denies this attempt. Denies current SI, HI, psychosis, substance use, pain, COVID symptoms. Reports that she does not want to be here as there is not a private room available for her and is concerned about the health of her baby. Also reports that she does not need to be here and would like to be discharged, transferred to another hospital or admitted to a floor. Concerns are validated. CPEP process reviewed. Comfort measures provided with pt presenting as more engaged by end of triage.     Pt med cleared    Current Mental Health Provider(s): [facility]    Any recent exposure to infestations(lice, scabies, bedbugs, fleas) or other communicable diseases: No    Substance use: denies     Ingestion: Yes escitalopram, prenatal vitamins    Self-harm: no    Medication Data Collection    Medication data collection: Yes: Other: dispense report, pt    Physical Assessment    Pain assessment: Last Nursing documented pain:  0-10 Scale: 4 (11/20/21 0637)    Last Filed Vitals    11/20/21 0637   BP: 100/69   Pulse: 82   Resp: 14   Temp: 37.2 C (99 F)   SpO2: 99%       Medical / Surgical History    PMH:   Past Medical History:   Diagnosis Date    Anxiety     Asthma     Depression     Gestational diabetes     Preeclampsia     Scabies     Substance abuse        PSH:   Past Surgical History:   Procedure  Laterality Date    CESAREAN SECTION, LOW TRANSVERSE      Left leg surgery      TONSILLECTOMY AND ADENOIDECTOMY         Review of Systems    Any additional concerns: no    Anticipated track: 2D    Val Riles, RN, 7:54 AM

## 2021-11-20 NOTE — Provider Consult (Signed)
Brief Tox phone note:    Reported prenatal ingestion.  We reviewed dispense records and calculated a max ingestion of < 5mg /kg of elemental iron.  Additionally the patient never exhibited nausea and vomiting, thus effectively ruling out significant iron toxicity.  She did have a coingestion of citalopram, however demonstrated no neuro excitation or QTc prolongation.  We agree with decision to clear the patient from a med tox standpoint.
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
## 2021-11-20 NOTE — Progress Notes (Addendum)
Rhesus.Barker) [phone]    2:10p-Writer attempted to contact [facility] to inquire about previous plan from last visit for pt to be discharged there and if there was still availability. Writer requested a return call.

## 2021-11-20 NOTE — First Provider Contact (Signed)
CPEP Provider Triage and Referral Note     CPEP initial provider evaluation performed by   CPEP Provider Initial Contact     Date/Time Event User Comments    11/20/21 1203 CPEP Provider Initial Contact [name] A --         Chief Complaint / HPI and Relevant Past History:     Chief Complaint   Patient presents with    Ingestion    Suicidal     Patient presented to CPEP on 11/20/2021  7:26 AM voluntarily with the above chief complaint..    Alexis Vang is a 30 yr old female, approximately 24 Alexis Vang pregnant, with history of psychosis, PTSD, dissociative disorder, anxiety, depression, cocaine use, and recent inpatient discharge who presented to [facility] triage with odd reports of "allergic reaction" and "near death experience;" however, then reported significant OD on SSRI and PNV in setting of cocaine and alcohol use. Pt was transferred to medical ED; then made conflicting reports to ED providers about ingestion, but noted to have mild clonus on exam and made odd statements about being a "robot" that people are controlling.    On contact, she is sleeping; awakens briefly, but is minimally cooperative. She continues to make conflicting statements about ingestion, perseverates intermittently on need for tetanus shot and "bugs everywhere," and now denies any substance use. She reports she was discharged from hospital [facility] [phone], 11/12-11/18) and did not go to [facility], but went to stay with "a man." Pt then states "I lie and I'll keep lying about things to get admitted," but then makes odd statements about "the man tracking her with devices" and requests to talk with social worker about discharge to "permanent housing."    She appears drowsy with poor eye contact, possibly metabolizing substances, and UCDS ordered.      Home Medications:     Prior to Admission medications    Medication Sig Start Date End Date Taking? Authorizing Provider   risperiDONE (RISPERDAL) 1 mg tablet Take 1 tablet (1 mg total) by mouth daily as  needed  for agitation 11/14/21  Yes Rob Hickman, NP   triamcinolone (KENALOG) 0.1 % cream Apply topically 2 times daily for 14 days  for Atopic Dermatitis to the following areas: rash on body.  Stop after rash resolves, do not use for more than 2 Alexis Vang 11/14/21 11/28/21 Yes Boldt, Claiborne Billings, NP   escitalopram (LEXAPRO) 20 mg tablet Take 1 tablet (20 mg total) by mouth daily  for Major Depressive Disorder 11/15/21  Yes Rob Hickman, NP   prenatal plus iron (PRENAVITE) tablet Take 1 tablet by mouth daily  for Pregnancy 11/15/21  Yes Rob Hickman, NP   ondansetron (ZOFRAN) 4 mg tablet Take 1 tablet (4 mg total) by mouth 3 times daily as needed 10/03/21  Yes Calvaruso, Alyssa Grove, NP   betamethasone dipropionate (DIPROSONE) 0.05 % cream Apply topically 2 times daily  for Atopic Dermatitis as a thin film to the following areas: hands.  Use for up to 2 Alexis Vang, can stop when rash resolves. 11/14/21   Rob Hickman, NP   aspirin 81 mg EC tablet Take 2 tablets (162 mg total) by mouth daily  Patient not taking: Reported on 10/03/2021 09/17/21   Ross Ludwig, MD   albuterol HFA (PROVENTIL, VENTOLIN, PROAIR HFA) 108 (90 Base) MCG/ACT inhaler Inhale 1-2 puffs into the lungs every 6 hours as needed for Wheezing.  Shake well before each use.  Patient not taking: Reported on 10/03/2021 09/17/21   Olsonchen,  Loma Sousa, MD       Vital Signs   Reviewed:  Last Filed Vitals    11/20/21 0637   BP: 100/69   Pulse: 82   Resp: 14   Temp: 37.2 C (99 F)   SpO2: 99%        MSE   Mental Status Exam  Appearance: Other (In hospital scrubs)  Relationship to Interviewer: Uncooperative, Eye contact limited  Psychomotor Activity: Normal  Abnormal Movements: None  Muscle Strength and Tone: Normal  Station/Gait : Other (Not observed)  Speech : Regular rate, Normal tone, Normal rhythm  Language: Fluent  Mood: Irritable  Affect: Flat  Thought Process: Perseveration, Illogical  Thought Content: Other, No homicidal ideation (makes conflicting statements regarding  SI)  Perceptions/Associations : No hallucinations  Sensorium: Drowsy, Oriented x3  Cognition: Poor attention span  Fund of Knowledge: Normal  Insight : Poor  Judgement: Poor      Labs:     Recent Results (from the past 24 hour(s))   CBC and differential    Collection Time: 11/20/21  5:50 AM   Result Value Ref Range    WBC 10.8 (H) 4.0 - 10.0 THOU/uL    RBC 3.3 (L) 3.9 - 5.2 MIL/uL    Hemoglobin 10.2 (L) 11.2 - 15.7 g/dL    Hematocrit 32 (L) 34 - 45 %    MCV 99 (H) 79 - 95 fL    MCH 31 26 - 32 pg    MCHC 32 32 - 36 g/dL    RDW 12.9 11.7 - 14.4 %    Platelets 248 160 - 370 THOU/uL    Seg Neut % 64.0 %    Lymphocyte % 22.8 %    Monocyte % 10.7 %    Eosinophil % 1.6 %    Basophil % 0.3 %    Neut # K/uL 6.9 (H) 1.6 - 6.1 THOU/uL    Lymph # K/uL 2.5 1.2 - 3.7 THOU/uL    Mono # K/uL 1.2 (H) 0.2 - 0.9 THOU/uL    Eos # K/uL 0.2 0.0 - 0.4 THOU/uL    Baso # K/uL 0.0 0.0 - 0.1 THOU/uL    Nucl RBC % 0.0 0.0 - 0.2 /100 WBC    Nucl RBC # K/uL 0.0 0.0 - 0.0 THOU/uL    IMM Granulocytes # 0.1 (H) 0.0 - 0.0 THOU/uL    IMM Granulocytes 0.6 %   Basic metabolic panel    Collection Time: 11/20/21  5:50 AM   Result Value Ref Range    Glucose 68 60 - 99 mg/dL    Sodium 134 133 - 145 mmol/L    Potassium 4.1 3.3 - 5.1 mmol/L    Chloride 102 96 - 108 mmol/L    CO2 21 20 - 28 mmol/L    Anion Gap 11 7 - 16    UN 12 6 - 20 mg/dL    Creatinine 0.58 0.51 - 0.95 mg/dL    eGFR BY CREAT 124 *    Calcium 8.9 8.8 - 14.4 mg/dL   Salicylate level    Collection Time: 11/20/21  5:50 AM   Result Value Ref Range    Salicylate <8.1 (L) 85.6 - 30.0 mg/dL   Acetaminophen level    Collection Time: 11/20/21  5:50 AM   Result Value Ref Range    Acetaminophen <5 ug/mL   Hold green with gel    Collection Time: 11/20/21  5:50 AM   Result Value Ref Range  Hold Green (w/gel,spun) HOLD TUBE    Ethanol    Collection Time: 11/20/21  5:50 AM   Result Value Ref Range    Ethanol <10 0 - 9 mg/dL         Initial Diagnosis:     Final diagnoses:   Suicidal behavior with  attempted self-injury        Assessment of Scope of Emergency Services Required: Needs admission to CPEP for further evaluation and treatment.     Initial Assessment   In my clinical opinion, based on the above documented information, patient requires further evaluation and treatment and is therefore admitted to CPEP on voluntary status, to complete a full CPEP evaluation, with the plan below.    CPEP Plan:   MD/NP:  MD/NP to do: labs, MD follow-up consult    RN:  RN to do: labs as ordered, milieu therapy, report back to MD    Clinical evaluator:  Clinical Evaluator to do : report back to MD     Collateral information from current providers, family or natural supports, other sources of collateral information if necessary (please specify)     Rahim Astorga, NP, 11/20/2021, 12:05 PM     Deanne Coffer, NP  11/20/21 1224

## 2021-11-21 NOTE — CPEP Notes (Addendum)
Writer left a voicemail for [facility] after hours [phone] requesting help with placing patient in a shelter.     Writer spoke with [facility] [phone]. [phone], they report patient is welcome at [facility] but patient will need to be approved by [facility] first. Patient is requesting to go to [facility] as first choice.     [employer])  [address]

## 2021-11-21 NOTE — CPEP Notes (Signed)
[facility] referral completed.

## 2021-11-21 NOTE — ED Notes (Signed)
11/21/21 0348   Disposition details   Patient is being discharged to community   Family notification not done-no family/no family involvement   Outpatient provider notification of disposition patient has no outpatient provider   Living situation: notification of disposition NA-homeless   Transportation arranged via cab (voucher)   Patient belongings  given to patient   Medications NA- patient has none   Valuables NA- patient has none
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
## 2021-11-21 NOTE — CPEP Notes (Signed)
Pt has been resting comfortably in milieu throughout shift. She is able to make her needs known appropriately. Pt has remained cooperative with staff and maintained behavioral control. No acute concerns this shift. q14min safety checks and comfort measures maintained.

## 2021-11-21 NOTE — CPEP Notes (Signed)
Patient slept intermittently throughout the night. On waking hours pt was pleasant on contacts, observed to be engaging in distractions in milieu and demonstrated ability to advocate for self. No acute distress observed by Clinical research associate - will continue to monitor. Q15 minute safety checks maintained per unit policy. Comfort measures provided for the duration of the shift.

## 2021-11-21 NOTE — ED Notes (Signed)
11/21/21 0345   CPEP Summary of Services   Safety precautions implemented personal belongings secured   Case consultation/discussion involving attending;registered nurse;social worker   Crisis intervention and safety planning involving verbal intervention;access to firearms discussed and denied;safety plan #1 developed (comment)   Collateral information obtained from other (comment)   Referral offered, accepted and completed for  Outpatient Mental Health services;Outpatient Chemical Dependency services;Case Management/Care Coordination services   Referral(s) offered but declined by patient for No referrals declined

## 2021-11-21 NOTE — CPEP Notes (Addendum)
11:30AM- [facility] returned call and pt is placed at [facility]. [facility] 1230[facility] return from from [place] at [facility] afterhours. She indicated pt has not been at [facility] since 11/07/21. She reports she is going to contact [facility] and try to place her and will return her call.     8:23AM- Writer contacted [facility] [phone] and left a voicemail requesting help with placing pt in a shelter.    Jari Favre, LMSW

## 2021-11-21 NOTE — Discharge Instructions (Signed)
CPEP Discharge Instructions    Discharge Date: 11/21/2021    Discharge Time:      Follow-ups:  A list of outpatient mental health and chemical dependency resources has been provided for you. Please follow up as soon as possible. A list of shelters has alos been provided for you.   You were referred to [facility] [place]) for care management services. A care manager can assist you with housing, income, healthcare, or any other community resources you feel you need assistance obtaining or navigating. A care manager will be reaching out to you within the next week to coordinate a time for an intake for enrollment. If you have questions or would like to request an update on your referral you may contact [facility] @ [phone].  You are declining all other referrals at this time.     The clinical team recommends the following actions for creating a safe environment post discharge utilize options as identified in completed safety plan , reach out to family and/or friends for support , remove any weapons or firearms from home, remove access to sharp objects, remove illicit substances and/or alcohol from home , take medications as prescribed , and remove old medications no longer prescribed     Level of outreach indicated if patient fails new intake or COPS (Comprehensive Outpatient Psychiatric Service) appointment: Routine Program Follow-up    When to call for help:    Call your psychiatric outpatient provider if experiencing any of these symptoms: increased irritability, sleep changes, appetite changes, energy changes, thoughts to harm yourself or others, anxiety, fear, auditory or visual hallucinations.  Lifeline Helpline (24 hours/7 days) [phone] Consulting civil engineer)  Mobile Crisis team: [phone]    General Instructions:  Other written information given to the patient: No    [place] area Outpatient Chemical Dependency Treatment Facilities    [facility]ency-5 outpatient  locations  Intake line to scheduled CD evaluations: [phone]    [facility]  [address] [place], [place] [address]  Walk-in hours: M-F, 8am to 3pm    [facility]  [address]  [place] [address]  Walk-in hours: M-F, 8am to [facility]    [facility]  [address] [place], [place] [address]  Walk-in hours: M-F, 8am to [facility]  [address] [place], [place] [address]  Walk-in hours: M-F, 8am to 3pm    [facility]  [address]  [place], [place] [address]  Wendee Beavers hours: W and Th, 8am-12pm    [facility] Restart: **Spanish speaking  [address] [place], [place] [address]  Phone: [phone]  Walk-in hours: M, W, F from 1pm to 2:30pm    [employer] (formerly [employer]): [facility]  [address]  [place], [place] [address]  [phone]  Walk-in hours: Monday - Friday, 9:00am - 11:00am  Services: Detox    [employer], 3 locations  Intake line to schedule CD evaluations: [phone]    [address]                      [place], [place] [address]          270-623 [place], [place] [address]    Maryan Rued Latina (Spanish speaking)  [phone]  [place], [place] [address]        Additional [place] area Outpatient Chemical Dependency Treatment Facilities    ABC: Action for a Better Community: CD-New Directions  [address]   [place], [place] [address]  Phone: [phone]    [address] Settlement:  [address]  [place], [place] [address]  Phone: [phone]    [facility] Counseling:   [address] [place], [place] [address]  Phone: [phone]    Delphi Rise:  [address] [place], [place] [address]  Phone: [phone] ext [phone]    FLACRA [facility] Recovery [facility])  Addictions Crisis Center  [address]  [place], [place] [address]  [phone]   -  Process: Call for a phone screening   - Needs: Coordinate transportation / medications   - Services: Alcohol / Opiate Detox                            [facility] [place]: A geriatric addiction service (for those 52 and older)  [address], [place]   [address] [place], [place] [address]  Phone: [phone]     [facility]:   [address] [place], [place] [address]  Phone: [phone]    [facility]:  [address]  [place], [place] [address]  Phone: [phone]    Additional Services:    Kevan [place] to Recovery:  [facility]  [address]  [place], [place] [address]  Dates Open: Thursday  Times:  5:00pm - 8:00pm  Services:  Drop in Center / Higher education careers adviser for Recovery    [employer] Center:  [address]  [place], [place] [address]  [phone]  Dates Open: Thursday  Times:  7:30pm  Services:  Heroin Anonymous Support Group  Chemical Dependency Resources     How can I tell if I have an alcohol problem?    Alcohol may be harmful for you if it causes a problem in any part of your life. Following are signs that you may have a drinking problem or are alcohol dependent.      Blacking out or forgetting where you were or what you were doing    Drinking to get drunk. Or, feeling like you need to drink more to get the same feeling or "buzz"    Drinking to decrease pain or stress    Drinking more than you had expected to drink    Drinking in a pattern. For example, every day or every week at the same time    Suffering from the effects of alcohol on your daily function    Drinking starts to take over and causes problems in your daily life, such as not showing up for work or driving when you are drunk    Trying to hide how much you drink   Feeling guilty or angry when someone says something about your drinking    Seeing or hearing things that are not there (hallucinating)    Having major personality changes when you drink    Planning activities  around drinking    Experiencing seizures (convulsions)    Shaking of your hands if you have not had a drink for a while    Sleeping problems or bad dreams    Sweating, nervousness, confusion, or depression    Thinking  a lot about drinking    Trouble having erections     What's the harm?    Not all drinking is harmful. You may have heard that regular light to moderate drinking (from  drink a day up to 1 drink a day for women and 2 for men) can even be good for the heart. With at-risk or heavy drinking, however, any potential benefits are outweighed by greater risks.    Injuries. Drinking too much increases your chances of being injured or even killed.  Alcohol is a factor, for example, in about 60% of fatal burn injuries, drownings, and homicides; 50% of severe trauma injuries and sexual assaults; and 40% of fatal motor vehicle crashes, suicides, and fatal falls.    Health problems. Heavy drinkers have a greater risk of liver disease, heart disease, sleep disorders, depression, stroke, bleeding from the stomach, sexually transmitted infections from unsafe sex, and several types of cancer. They may also have problems managing diabetes, high blood pressure, and other conditions.    Birth defects. Drinking during pregnancy can cause brain damage and other serious problems in the baby. Because it is not yet known whether any amount of alcohol is safe for a developing baby, women who are pregnant or may become pregnant should not drink.    Alcohol use disorders. Generally known as alcoholism and alcohol abuse, alcohol use disorders are medical conditions that doctors can diagnose when a patient's drinking causes distress or harm. In the [place], about 18 million people have an alcohol use disorder.       Wellness Hints:    Be honest and open about your alcohol use with your family, close friends, and health care professionals. Ask for and accept their help.  Avoid persons who use and/or abuse alcohol and who  try to get you to drink alcohol.   Get the help of a counselor and keep regular appointments.   Eat a normal, well-balanced diet, drink six to eight glasses of water a day, and get plenty of rest.   Replace social activities associated with drinking alcohol with those that do not involve alcohol.   Consider cutting down on the amount of alcohol you drink and how often you drink it.     OPEN ACCESS CENTER  [address] [place] [address] [phone] OPEN 24 hours/ 7 days week (Spanish Speaking available)  (Se habla Espaol! Proveemos Asesoramiento y [employer]. Referidos para programas mdicos de Detoxificacin, internado (inpatient) y programas Ambulatorios (outpatient). )  [address] [place] [address] [phone] OPEN 7AM to 10PM/ 7 days week (closed for major holidays)  *both locations serve [place], [place], [place], [place], [place], [place], [place], [place] and [place]  Providing help with opioid, alcohol and other substances. Assessments, evaluations and referrals to detox, inpatient and/or outpatient services.    MOBIL TREATMENT OPTIONS:  [facility]   Operated by [employer] of Alcoholism & Substance Abuse (GCASA)   *hours vary (p) [phone]   [employer] & [place] [facility] Mobile 24/7 Opiod Response Team   [place] [facility] Recovery (FLACRA) (p) [phone]     DETOX FACILITIES:  [place]  [address] [place] [address] [phone]   [facility])  [address] [place] [address] (6th floor) [place] [address] [phone]   FLACRA [facility])- Alcohol Crisis Center   [address] [place] [address]  *Medicaid Managed Care and Private Ins. Only  (  p) [phone]   [facility], Alcohol Only)  [address] [place] [address] [phone]   [facility] - Outpatient Only)  [address] [place] [address]  *Walk in Hours M-F 8:30AM  (p)  [phone]   Pathways of [facility] Health)  [address], [place] [address]  www.[email or web address].health Sabrina Howland (intake)  (p) [phone]     [facility]   (Opiates only with Suboxone)  [address] [facility] [address] [place] [address]  www.[email or web address] (p) [phone] or [phone]   [facility] Recovery  [address] [address]  www.[email or web address] (online referral available)  (p) [phone] (toll free)  (p) [phone]       INPATIENT PROGRAMS:  [facility]   [address] [place] [address] [phone]   CASA [address] -   [address] [place], [address] [place] [address] ([phone] or   [phone] Ext [phone]     [facility]   [address] [place] [address] ([phone]   [facility]   [address] [place] [address] Dorothy Spark Building) (p) [phone]   [facility]  [address] [place] [address] [phone]   [facility]  [address] [place] [address] [phone] option 3   [facility]  [address] [place] [address] [phone] option 4   [facility]  [address] [place] [address] [phone]   [facility] - (2 Locations)   [facility]   [address] [address]  *Walk in Hours 7 days a week 9am-11am    [facility]   [address] [place] [address] (p) [phone] ext. [phone] (self-referral line)        (p) [phone]   [facility] [place] [facility])  [address] [place] [address] [phone]   [facility]  [address] [place] [address] (direct phone# [phone])  Application online: www.horizon-health.org (p) [phone]    [facility]  [address] [place] [address] [phone]   [facility]  [address] [place] [phone]   [facility]  [address] [place] [address] [phone]   [facility]   [address] [place] [address] (located on [employer] campus) (p) [phone]   Damita Lack [facility]  [address] [place] [address] ([phone]     [facility]   [address] [place] [address] [phone]   [facility]ways [facility] Health)  [address] [place] [address]  www.[email or web address] Wylie Hail (intake)  (p) [phone] ext [phone]     [facility]   [address] [place] [address] ([phone]   [facility]  [address] [place], [address]) [place] [address] [phone]   Holiday representative [facility])   [address] [place] [address] [phone] ext [address] [facility]  [address] [place] [address] [phone]   [facility] (2 Locations)   [facility]   [address] Kendell Bane  [place] [address]    [facility])   [address] [place] [address] [phone]      [phone]   [facility]   [address] [place] [address]  [phone]   [facility] Inpatient Chemical Dependency Treatment Center   [address] [place] [address] [phone]   [facility] Recovery  [address] [place] [address]  www.[email or web address] (online referral available) (p) [phone] (toll free)  (p) [phone]   [facility] (No Medicaid/Medicare)   [address] [place] [address] [phone]     OUTPATIENT PROGRAMS:  Action for a Better Community  [address] [place] [address]  www.SocialListing.com.br  *Walk in Hours Monday & Wednesday 8:30am-Noon and T &TH 3pm-5pm at   [address] [phone]  Ext. [phone]   [facility] -  Comprehensive Alcoholism  [address] [place] [address] [phone]   [place]  [address] [place] [address] [phone] Ext [address] (3  Clinics)   [address]  Mobile [place] [address]   [address] [place] [address]   [address]  [place] [address]  *Do not accept Medicare Ins. (p) [phone] ([place])  (p) [phone] ([place])  (p) [phone] [place])   [facility] Services  [address] [place] [address] [phone]   [facility] (Restart)  [address] [place] [address]  *Walk in hours Monday, Wednesday, Friday 1pm-2:30pm at N.Barnabas [place]ries ([place]   [facility] Outpatient Chemical Dependency  [address] [place] [address] Dorothy Spark Building) (p) [phone]     [facility]  [address] [place] [address]  www.[email or web address] (p) [phone]   [facility] (Monday- Thursday)  [address] [place] [address]  www.delphirise.org  *Walk in hours Monday - Thursday 8:00am- 4:00pm (p) [phone]   [facility]   [address] [place] [place] [address] ([phone]   [facility] on Alcohol & Substance Abuse (GCASA)  [address], [place], [place] [address]  [address] [place], [place] [address]  [address], [place], [place] [address] ([phone] ([address])  (p) [phone] Jerolyn Shin)  (p) [phone] Veva Holes)   Huther Doyle  [address] [place] [address]  StubAgent.pl  *Walk in Hours Monday - Friday 8:30am, 10:00am, 12:30pm & 2:00pm (p) [phone]   Melissa Noon (Hispanic Outreach) - [facility] Restart  [address] [place] [address] [phone]   [facility])  [address] [place] [address] [phone]   [facility]   [address] [place] 2 [place] [address] (direct phone [phone])  [address] [place] [address]  (direct phone [phone])  [address] [place]  [address] Jenelle Mages) (direct phone [phone])  [address] [place] [address] [facility]) (direct phone [phone])  BuyingShow.es  *Walk in Hours Monday - Friday  8:00am - 3:00pm at all sites (p) [phone]  (indicate location when calling main intake line)   [facility] Pathways  [address] [place] [address]  www.chsbuffalo.org (p) [phone]   [facility]   [address] [place] [address]  www.[email or web address]  *Walk in Hours Monday -  Wednesday 8:00am - 9:30am   *Phone Intake Monday - Friday 8:00am - 4:00pm (p) [phone]   (f) [phone]   [facility]  [address]) [place] [address]  www.westfallassociates.com (p) [phone]     RESIDENTIAL/SUPPORTIVE LIVING FACILITES:  [facility] (Restart)  [address] [place] [address] [phone] Ext [phone]     [facility] Recovery Services (CARS)   [address] [place] [address] ([phone]   [facility]   [address] [place] [address]  www.easthouse.org (p) [phone]     [facility] (2 Locations)           [facility]          [address] [place] [address]            [facility]          [address] [place] [address]  *Medicaid Managed Care and Private Ins. Only  (p) [phone] [employer])    (p) [phone] [facility])    [employer] - Supportive Living   Locations in [place], [place], [place], [place] and [place]   *Apply through email address: [email or web address] No Number Listed  Use email address to apply    [facility] Chemical Dependency Services, Inc.   [address] [place] [address] [phone]   Pathway [facility] Health   [address] [place] [address]  www.helio.health (p) [phone]     [facility]  [address] [place] [address]  BuyingShow.es (p) [phone]       [facility]    [address] [place] [address] [phone]   [facility] Supportive Living   [address] [place] [address]  www.ywca.org (p) [phone]       [facility] ADMINISTRATION:  [facility]  [address] [place] [address] [phone]     [place]  [address] [place] [address] [phone]     [place]  [address] [place] [address] [phone]     [place]  [address] [place] [address] [phone]   [facility]  [address] [place] [address]  *only offers intake appointment if veteran is unable to be seen at another facility.  (p) [phone]   [facility]   [address] [place] [address] ([phone]       Resources for Drug and/or Alcohol Treatment:  https://www.barber.com/  https://ncadd-ra.org/    ADDITIONAL RESOURCES:  Al-Anon/Al-Teen (http://www.al-anon.alateen.org/)  250-023-3297   Alcohol Anonymous (AA)  (RentalRefinancing.at)   [place]. [phone]  [place]: [phone]  Phone Meetings: [phone]   Cocaine Anonymous (CA) (SatelliteStock.ch)  [phone]   Heroin Anonymous (HA) (http://www.heroinanonymous.org/)     Marijuana Anonymous (MA) (http://www.marijuana-anonymous,org/)  204-694-5354   Drug and Alcohol Council 941-494-5610   Life Line 6411218652 or [address] on Alcohol & Substance Abuse [place]) [phone]   Narcotics Anonymous [place] 24-hour Hotline  (http://rochesterny-na.org/)   (http://flana.net)    [place]: 518-208-5942  [place]: (316)[phone]   Nicotine Anonymous (NicA) (http://www.nicotine-anonymous.org/)  9158468912   [place] Office of Alcoholism & Substance Abuse Services (https://www.oasas.https://fisher-stokes.com/)  978-164-4712   Pathways Methadone Maintenance Program  [address] [place], [place] [address] [phone]  M-F 6am-3pm   Refuge Recovery (http://www.refugerecovery.org/)  Clefler80@gmail .[email or web address]   ROCovery Fitness (http://www.rocoveryfitness.org/)  [address] [place] [address] [phone]  mail@rocoveryfitness .Lorraine Lax Recovery Methadone Maintenance Program  [address] [place] [address] [phone]     Mental Health Clinics - Adult [facility])    Behavioral Health Access and Crisis Center - [address] (Entrance is at [address]) [place] [address] [phone]  Walk-in hours:   Every day From 9:00am - 9:00pm     [facility] - [address] [place] [address] [phone]  Walk-in hours for adults:  Monday - Friday  from 8:30am - 12:00pm [facility])    [facility] Services - [address] [place] [address] - [phone]    [facility] - [address] [place] ([place]) [place] [address]- [phone]    [facility]) - [address] [place] [address] [phone]  Also home of [facility] Health - [address] [place] [address] - [phone]    [facility] Mental Health - [address] [place] [address] - [phone]    [facility] Health - [address] [place] [address] - [phone]    [facility] Health - [facility] - [address] [place] [address] - [phone]    [facility] - [address] [place] [address] [phone]  Walk-in hours for adults:  Thursdays from 8:30am - 9:30am    [facility] - [address] [place] [address] - [phone]    [facility] Behavioral Health - David Stall - [address] [place] [address] - [phone]    Strong Behavioral Health - Older Adults Service (60+) - [address] [place] [address] - [phone]    [facility] Strong Ties - [address] [place] [address] [phone]  Includes programs such  as ACT (Assertive Community Treatment), MIPS (Medicine in Psychiatry), and a young adults program targeting first-break psychosis.    [facility] - [address] [place] [address] - [phone]  3 levels of care include:  Partial Hospitalization  Intensive Outpatient Treatment  Outpatient Therapy    [facility] - [address] [place] [address] [phone]    **Please be advised about walk-in hours: this typically involves a same-day meeting with a therapist or counselor that initiates the intake process. You may be unlikely to see a provider for AT LEAST 3 visits and are not able to start medication management until that time.**  MEALS, SHELTERS, CLOTHING AND REFERRAL SERVICES     A Meal and More  [address]  [place], [place] [address]  Phone: [phone]  Provides: Hot Meal: 12:00 pm Wednesday and1:30pm Sunday    Alternatives for Battered Women  Phone: [phone]  Phone: [phone] (after hours)  Walk- ins are welcome.    Alternatives for Interdependent Youth  [address] (office)  [place], [place] [address]  Phone: [phone]  Phone: [phone] (after hours)  Hours: 9:00am-5:00pm  Services female and female youth ages 53 - 27 in Transitional Housing and Supportive Apartment Programs.    [facility]  [address]  [place], [place] [address]  Phone: [phone]  Website: FerrariGroups.co.nz  Provides: Breakfast: Tuesday-Friday 7:00am-9:00am  Lunch: Wednesday 11:30am-12:30pm,  Sunday 12:30pm-1:30pm.  Personal hygiene items Saturday 7:30am-11:30am, laundry and showers 7:30am-11:30am Wednesday and Saturday. Referrals needed.    [facility]  [address]  [place], [place]  Phone: [phone]  For homeless and low income families, meal provided every 3rd Saturday of the month,  food pantry also available.    [address] [place], [employer].  Emergency and [facility]  [address]  [place], [place] [address]  Phone: [phone]  Provides resources, services, information, and referrals to low-income families,  individuals and seniors who are in transition, in crisis or in isolation. Assistance with food, food vouchers, emergency rent/mortgage, clothing long term housing referrals, bus passes, prescriptions, access to the Mature Adult [employer] are available. Thanksgiving and Christmas Baskets also distributed.    [facility]  [address]  [place], [place]  Phone: [phone]  Provides emergency housing for individual women age 38 and up and women with small children    [address]  [address]  [place], [place] [address]  Phone: [phone]  Hot meals provided Monday-Friday.    [facility]  [address] [place], [place] [address]  Phone: [phone], ext. [phone]  Provides emergency shelter for men only    [facility]  [address]  [place], [place] [address]  Phone: [phone]  Hot lunch program: Monday-Saturday 11:30am-12:30pm  Dinner for children younger than age 22: Monday-Saturday at 5:00pm  Sunday dinner: 4:00pm  Food pantry available with agency referral: Monday-Friday from 9:00am-11:00am.  Provides clothing, Tuesday-Thursday 9:00am-1:00pm. Must call for appointment  Website: http://www.cameronministries.[facility]    [facility]  Phone: [phone]  Website: www.cfcrochester.org  [employer] (mens housing)  [facility] Place and [employer] (women and their children)   Provides emergency and transitional housing service to men [facility]), women ([address]), and children [facility]) as well as guidance and services to re-establish stability in their lives    Center for [facility]  [address]  [place], [place] [address]  Phone: [phone]  [phone] (toll free)  Website: www.centerforyouth.net  Provides emergency housing, counseling, and referrals for individuals  ages12-21.    [facility]  Phone: [phone]  Provides services for men leaving correctional facilities. Participants were generally involved with [facility] during incarcerated.    [facility]  [address]  [place], [place]  Phone: [phone]  Website: http://www.christchurchrochester.org/  Provides hot meals on Sundays: 1:30pm-2:30pm and Wednesdays: 12:00pm-1:00pm    Coalition of Emergency [employer] (hotline)  Phone: [phone]  24 hour phone line with information about community food cupboards throughout  [facility]  Phone: [phone] (call for a home visit)  [phone] (Se Habla Espanol)  September 29, 2011-September 27, 2012  Allows low-income [place] Residents in area codes: [address], [phone] (east side) and [address](west side) the opportunity to learn to improve health and make their homes safer. Services include: Fire and carbon monoxide prevention, rodent control, asthma and lead prevention and smoking cessation. FREE SAFETY SUPPLIES PROVIDED.    [facility]  [address]  [place], [place]   Phone: [phone]  Hours: Tuesday, Wednesday and Thursdays, 10:00am-4:00pm and Saturday from 10:00am to 3:00pm. Open to the public, used clothing and some household goods are sold at affordable prices. Cash only.    [employer] Kinship Navigator Program  [facility]  Offer support to relatives caring for children. Questions concerning  family court, special needs, financial help, early intervention, and how to sustain employment etc. are often explored.    Higher education careers adviser  Phone: [phone]  Car seat program provides car seats one time per month. Referrals from service  providers needed. Applicants must be receiving food stamps, TA etc. to show eligibility. Services concerning housing are also available.    [employer]  [address]  [place], [place] [address]  [id number]  Email: [email or web address] [email or web address]  Hours:  Wednesday & Saturday only, starting at 9:30am.  Provides free bikes. Individual must have a bike lock and chain or have $5 for a  lock/chain. Bikes given away utilizing a lottery system-so there is no need to come and get in line. Photo 10 now required.    [facility]  [address]  [place], [place] [address]  Phone: [phone]  Fax: [phone]  Email: [email or web address]@[place]aines .https://miller-johnson.[email or web address]/  Provides household items to families in need that live in [address] Zip Code only. Dishes, furniture, linens and pots/pans are available.    [facility]  [address]  [place], [place] [address]  Phone: [phone]  Hours: Tuesday & Wednesday 9:00am-4:00pm, Saturday10:00am-1:00pm  Provides household items, also has a reading program for children.    [employer]  [address]  [place], [place] [address]  Phone: [phone]  Certified pre-k/childcare services up to school age. DSS accepted.  Retail center, used household items and clothing.    [facility]  [address]  [place], [place]  Phone: [phone]  Provides temporary housing for people in outpatient or inpatient programs. Referral needed    [facility]  [address]  [place], [place]  Phone: [phone]  Provides temporary housing for men from November to April  Drop in lunch Tuesday-Thursday 1:30pm-4:30pm  Emergency food cupboard: Monday 9:30am-11:15am, Wednesday 1:30pm-4:15pm, Thursday & Friday: 9:30am-11:15am  Website: http://www.dimitri-house.org/    [facility]  [address]  [place], [place] [address]  Phone: [phone]  Soup kitchen available:12:00pm-2:30pm    [facility]  [address]  [place], [place] [address]  Phone: [phone]  Provides: Emergency food pantry & holiday food baskets to needy families    [facility] [place]  [address] [place], [place]  Phone: [phone]  Hours: Monday-Friday  8:30am-5:30pm  Provides emergency services & alcohol treatment services (referral needed)    [facility]  [address]  [place], [place] [address]  Phone: [phone]  Shelter for men only    [facility]  [address]  Phone: [phone]  Alcohol and drug rehabilitation for men only    [facility]  [address] [phone]  Phone: [phone]  Provides food: Tuesdays 12:00pm &  Sundays 1:30pm at [facility]  [address] [phone]    [facility]  [employer]  Phone: [phone]  Provides temporary housing as well as case management services to males and  females who are 56-57 years old    [address] of [place]  [address]  [place], [place] [address]  Phone: [phone]  Offers a 90 day housing program. No Walk-ins, must have parole officer referral  Website: http://sanchez.info/    [employer]  [address]  [place], [place] [address]  Phone: [phone]  Phone: [phone]  Provides hot lunches Tuesday-Friday, dinner offered Tuesdays  and breakfast offered Wednesdays. Activities and support opportunities available during the day. Free donated clothing, usually offered in the afternoon  Website: www.graceurbanministries.com    [facility]  [address]  [place], [place] [address]  Phone: [phone]  Provides hot meal on Wednesday 11:30am-1:30pm    [facility]  [address]  [place], [place] [address]  Phone: [phone]  Arranges emergency shelter services for children only. 24 hour service    [facility]  Phone: [phone]  Provides residential program for women and their children    [facility]   [phone]   [address], [place], [place] [address]   Emergency shelter and referral services  www.houseofmercy.org    Housing Council in the [facility]  [address]  [place], [place] [address]  Phone: [phone]  Provides home  ownership counseling to individuals and families; mortgage foreclosure prevention, rental assistance (through its [employer]) & a housing hotline with daily listings of available apartments.  Website: www.thehousingcouncil.[facility]    Jud[employer] Commission  [address]  [place], [place] [address]  Phone: [phone]  Hours (walk-ins welcome): Mondays & Tuesdays 10:00am-12:00pm, 1:00-3:00pm   Provides referrals to food cupboards, referrals to obtain clothing and household items   Website: AwesomeConnections.co.uk    [facility]  [address]  Phone: [phone]  Alcohol and drug rehabilitation for women    [facility]  [address]  [place], [place] [address]  Phone: [phone]  Hours: Tuesday-Thursday 10:00am- 4:00pm, Saturday 10:00am-3:00pm    [facility]  [address]  [place], [place] [address]  Phone: [phone]  Provides housing for pregnant and parenting women, ages 16-21. Clothing, meals, counseling and educational services for residents.  Website: TrafficTaxes.com.cy    [facility]  [address]  [place], [place] [address]  Phone: [phone]  Soup Kitchen: Wednesday 5:00pm-6:00pm    [employer]  Phone: [phone]  [facility]: housing and support for teens during pregnancy until child is 97 months old.    [facility]  [address]  [place], [place] [address]  Phone: [phone]  Provides emergency food assistance. Free clothing and furniture when available.    [employer]  Phone: [phone]  Provides housing for qualified female ex-offenders after an interview    [facility]  [address]  [place], [place] [address]  Provides a soup kitchen to the homeless and low-income men, women and children of [place], free of charge. All participants receiving a meal are required to attend a 30 minute non-denominational religious service. Doors open daily at 4:00pm, followed by a coffee hour. No admittance  after 8:00pm Monday-Saturday. No admittance after 7:00pm on Sundays  Provides emergency overnight housing facilities to homeless males in the [place] area, with access to bathrooms and showers. Visitors must be present by 4:00 PM and must have proper ID (ie. Drivers license, DSS car, Doctor, general practice)  Website: www.opendoormission.com    [facility]  [address] [place]  Phone: [phone]  [place], [place] [address]  Hours: 9:00am-12:00pm and1:00pm-3:00pm  Provides free clothing for Women Tuesday-Friday by appointment only.  Website: www.opendoormission.com    [employer]  [address]  [place], [place] [address]  Phone: [phone]  Development of low-income housing through new construction and rehabilitation of existing stock, homeownership initiative program, rental and transitional housing  Website: www.pathstone.org    [employer]  [address]  [place], [place] [address]  Phone: [phone]  Provides hot meal: 8:00am-10:00am, Wednesdays and Fridays    [facility] [place] [place]  [address] [place] [address]  [place], [place] [address]  Phone: [phone]  Soup Kitchen: Saturdays 2:00pm-2:00pm  Food Pantry: 3rd Tuesday of the month 10:00am-12:00pm. I.D. and income verification required    [facility]  [address]  [place], [place] [address]  Phone: [phone]  Provides shelter and food for homeless families and case management    [facility]  [address]  [place], [place] [address]  Phone: [phone]  Hours: Monday-Friday 8:30am-4:00pm  Food, clothing and social services, call for information    [facility]  [address]  [place], [place] [address]  Phone: [phone]  Provides services only to residents of public housing and persons participating in section 8 housing. Call the [employer] for other concerns at [phone]   Website: www.rochesterhousing.[facility]  [address]  [place], [place] [address]  Year round outdoor market to purchase fresh fruit, meat, poultry, baked goods, seasonal produce and more. Tuesdays and Thursdays 6:00am-1:00pm and Saturdays 5:00 am-3:00pm    [facility]  [address]  [place], [place] [address]  Phone: [phone]  Provides a soup kitchen: 6:00pm Friday and Saturday  Bible Study: (with meal before) Sunday, 4:00pm-8:00pm  Coffee House: 6:00pm, Friday and Saturday    [facility]  [address] [place], [place] [address]  [phone], ext [phone]  Provides housing for chronically homeless men, including chronically homeless veterans    [facility]  [address]  [place], [place] [address]  Phone: [phone]  Provides furniture and referrals for food and clothing    [facility]  [address]  [place], [place] [address]  Phone: [phone]  Hot Meal: 12:00pm-1:00pm, Monday and Tuesday, September-June  12:00pm-1:00pm Mondays in July and August  Clothing available: last Monday of the month (call for information)    [employer]  [address]  [place], [place] [address]  Phone: [phone]  Fax: [phone]    [address] [place])  Phone: [phone]. [phone]  Hours: Mon, Tue, Thu, Fri, Sat 10:00am-6:00pm  Wednesday10:00am-8:00pm    [address] ([place])  Phone: [phone]  Markus Jarvis, Fri 9:00am-6:00pm  Wednesday 9:00am-7:00pm  Saturday 9:00am-6:00pm    [address] ([place])  Phone: [phone]  Farris Has, Wed, [place], Fri 9:00am-9:00pm  Saturday 9:00am-6:00pm    [address] [place])  Phone: [phone]  Monday-Saturday 9:00am-9:00pm    [address] [place])  Phone: [phone]  Farris Has, [place] 9:00am-6:00pm  Wednesdays and Fridays 9:00am-8:00pm  Saturdays 9:00am-5:00pm    [facility] - [address]  Phone: [phone]  Markus Jarvis, Fri, [place] 9:00am-5:00pm  Wednesdays  9:00am-8:00pm    [place] - [address]  Phone: [phone]  Mon, [place], [place], [place], Fri 9:00am-6:00pm  Saturdays 9:00am-5:00pm    [facility] [address]  Phone: [phone]  Mondays 9:00am-6:00pm  Hetty Ely, Thu, Fri 9:00am-8:00pm  Saturdays 9:00am-6:00pm    [place] - [address]  Phone: [phone]  Farris Has, Thu, [place] 9:00am-6:00pm  Wednesdays and Fridays 9:00am-8:00pm  [place] - [address]  Phone: [phone]  Mon, Sindy Messing, Magdalene Molly, Fri 9:00am-8:00pm  Saturdays 9:00am-6:00pm    [place] - [address]  Phone: [phone]  Mon, [place], Thurs 9:00am - 6:00pm  [place], Fri 9:00am 8:00pm  Saturdays 9:00am-5:00pm    [facility]  [address]  [place], [place] [address]  Phone: [phone]  Provides transitional housing, intensive care counseling for women and their children,  assistance in finding permanent housing for up to 6 months  Website: http://vasquez.com/    [employer]  Phone: [phone]  Provides for qualified female ex-offenders after an interview    [facility]  [address]  [place], [place] [address]  Phone: [phone]  Provides meals Monday-Friday 11:30am- 12:00pm &12:00pm-12:15pm & Sunday at 4:00pm  Emergency mens shelter 9:00pm-7:00am    [facility]  [address]  [place], [place] [address]  &  Mail [address]  [place], [place]  Provides lunch Monday, Wednesday and Friday at 11:45am-12:30pm and brunch on Tuesday at 10:00am. Provides clothing on Thursdays and in case of emergency    [facility]  [address]  [place], [place] [address]  Phone: [phone]  Provides lunch Monday-Friday 12:00pm-1:15pm  Clothing: for men, women and children 9:30am-11:00am on Thursdays  Website: www.stpeterskitchen.org    [facility]  [address]  [place], [place] [address]  Phone: [phone]  Hot Meal: 11:30am on Saturdays  Food Cupboard: Monday and  Thursday, 9:30am-12:00pm    [facility]  [address]  [place], [place] [address]  Phone: [phone]  Provides hot meals daily from 5:00pm-6:00pm    [facility]  [address]  [place]  Phone: [phone]  Provides Community Supper on the last Tuesday of every month at 5:30pm  Clothing store ($1.00 donation asked) Wednesday and Saturday 9:30am-12:00PM (S.W.E.M. location pick-up)    [facility]  Phone: [phone]  Provides individualized counseling to potential home buyers    [facility] [employer]  Phone: [phone]  Services for single female veterans with chemical dependency issues    [employer]  [address]  [place], [place] [address]  Phone: [phone]  Provides emergency family shelter, permanent supportive housing for chronically  homeless men. Also an residential re-entry program for men and women  Website: www.voawny.org    Volunteers of [employer]  [address]  [place], [place] [address]  [phone]  Website: www.voawny.org  Offers clothing, furniture, bedding, appliances and household items at low prices.  Eleven locations in [place] and surrounding areas    [facility]  [address]  [place], [place] [address]  Phone: [phone]  Provides temporary housing, case management, and other services for low income single parents. Parents must be over 55 years old, drug free, with physical custody of children.  Website: www.wilson commencementpark.[facility]ants [facility]) Supplementary Food Program  [address] 4th floor  [place], [place] [address]  Phone: [phone]  Hours: Monday-Friday 8:30am-4:30pm  Provides a food program for pregnant women, breast-feeding women and women with children up to the age 31.    [facility] [place] [facility]  [address] [place], [place] [address]  Phone: [phone]  Provides  immediate housing needs for homeless women and  children  Website: www.ywcarochester.org          The above information has been discussed with me and I have received a copy.  I understand that I am advised to follow the instructions given to me to appropriately care for my condition.

## 2021-11-25 ENCOUNTER — Telehealth: Payer: Self-pay

## 2021-11-25 LAB — EKG 12-LEAD
P: 49 deg
PR: 150 ms
QRS: 52 deg
QRSD: 89 ms
QT: 411 ms
QTc: 464 ms
Rate: 77 {beats}/min
T: 117 deg

## 2021-11-25 NOTE — Telephone Encounter (Signed)
Attempted to contact the patient and schedule next [facility] appt. VM is full.

## 2021-11-25 NOTE — Telephone Encounter (Signed)
Called pt and schedule with Dermatology [name], MD) on Wednesday 12/04/2021 at 11:15 AM/DCG

## 2021-12-03 ENCOUNTER — Ambulatory Visit: Payer: Medicaid Other

## 2021-12-03 NOTE — Progress Notes (Deleted)
[place] Prenatal Visit    SUBJECTIVE  Alexis Vang is a 30 y.o., [id number] female presenting for routine OBC at [redacted]w[redacted]d. ***     Contractions: {Yes/No/Free text:23311}   Loss of fluid: {Yes/No/Free text:23311}   Bleeding: {Yes/No/Free text:23311}   FM:  {Yes/No/Free text:23311}      Pregnancy complicated by:   Patient Active Problem List   Diagnosis Code    Paranoia F22    Psychosis, unspecified psychosis type F29    Scabies B86    High-risk pregnancy O09.90    PTSD (post-traumatic stress disorder) F43.10    History of cesarean section complicating pregnancy Q000111Q    H/O fetal anomaly in prior pregnancy, currently pregnant O09.299    History of gestational diabetes in prior pregnancy, currently pregnant O09.299, Z86.32    Hx of preeclampsia, prior pregnancy, currently pregnant O09.299    Substance use disorder F19.90    Homeless Z59.00    Asthma during pregnancy O99.519, J45.909    Trichomonas infection A59.9    Possible exposure to STD Z20.2    Cocaine use complicating pregnancy Q000111Q, F14.90    Staph aureus infection A49.01    Pregnancy Z34.90    Rash of hands R21    Posttraumatic stress disorder with dissociative symptoms F43.10    Psychosis, paranoid F22     Problem list, vitals, family & social history, medications, and allergies were reviewed and updated as appropriate today.      OBJECTIVE  There were no vitals filed for this visit.           Urine POCT:        *** Patient did not leave urine specimen     Exam:            {female adult master:310786}      ASSESSMENT / PLAN  30 y.o., [id number] at [redacted]w[redacted]d with high-risk pregnancy. ***    No orders of the defined types were placed in this encounter.    Today's Plan:  Psychosis, unspecified psychosis type  - Admissions to CPEP: 11/12-11/18 and 11/23-11/24  - Most recent admission due to overdose SSRI plus ETOH and cocaine  - Admission 11/12-11/18 for SI/HI   - Current meds: risperidone 1mg  daily prn and lexapro 20mg   daily    High-risk pregnancy  <> 28 week labs ordered    Substance use disorder  - History of polysubstance use including cocaine, methamphetamine, heroin  - SW involved    History of cesarean section complicating pregnancy  - P1: SVD  - P2: vertical skin incision, reports prior C/S at "6 months"- neonatal demise; also had hydrops and clubbed feet  - Denies being told she had a vertical uterine incision or needed to have c-sections in the future  - Cannot remember which hospital she delivered at so no Op note available  <> Prefers repeat C-Section     Asthma during pregnancy  - albuterol prn     Trichomonas infection  - Trichomonas cx pos 09/06/21; Treated in Triage on 09/09/21.  GC/CT, HIV, hep B & C, syphilis neg   - TOC neg x 3 on 10/24/21  - positive trich [facility] admission on 11/12  <> TOC sent today       Staph aureus infection  - Seen by dermatology for a rash inpatient  - Bacterial cx growing staph aureus and corynebacterium  - s/p treatment with Keflex for a total of 7 days     H/O fetal anomaly in prior pregnancy, currently  pregnant  - Prior pregnancy in 2020  - Fetus with hydrops and clubbed feet  - Reports she required 2 amnioreductions for polyhydramnios  - Ultimately delivered at "6 months" and had neonatal demise  - No abnormalities seen on anatomic, although some views limited  <> Declined genetic screening in past    Follow-up:   Return to the office in {Time; weeks 1-4:12805} for ***.    D/w Dr. Cleda Clarks DO  OB/GYN PGY 4  [phone]

## 2021-12-03 NOTE — Assessment & Plan Note (Deleted)
<>   28 week labs ordered

## 2021-12-03 NOTE — Assessment & Plan Note (Deleted)
albuterol prn

## 2021-12-03 NOTE — Assessment & Plan Note (Deleted)
-   Prior pregnancy in 2020  - Fetus with hydrops and clubbed feet  - Reports she required 2 amnioreductions for polyhydramnios  - Ultimately delivered at "6 months" and had neonatal demise  - No abnormalities seen on anatomic, although some views limited  <> Declined genetic screening in past

## 2021-12-03 NOTE — Assessment & Plan Note (Deleted)
-   Admissions to CPEP: 11/12-11/18 and 11/23-11/24  - Most recent admission due to overdose SSRI plus ETOH and cocaine  - Admission 11/12-11/18 for SI/HI   - Current meds: risperidone 1mg  daily prn and lexapro 20mg  daily

## 2021-12-03 NOTE — Assessment & Plan Note (Deleted)
-   Trichomonas cx pos 09/06/21; Treated in Triage on 09/09/21.  GC/CT, HIV, hep B & C, syphilis neg   - TOC neg x 3 on 10/24/21  - positive trich [facility] admission on 11/12  <> TOC sent today

## 2021-12-03 NOTE — Assessment & Plan Note (Deleted)
-   P1: SVD  - P2: vertical skin incision, reports prior C/S at "6 months"- neonatal demise; also had hydrops and clubbed feet  - Denies being told she had a vertical uterine incision or needed to have c-sections in the future  - Cannot remember which hospital she delivered at so no Op note available  <> Prefers repeat C-Section

## 2021-12-03 NOTE — Assessment & Plan Note (Deleted)
-   History of polysubstance use including cocaine, methamphetamine, heroin  - SW involved

## 2021-12-03 NOTE — Assessment & Plan Note (Deleted)
-   Seen by dermatology for a rash inpatient  - Bacterial cx growing staph aureus and corynebacterium  - s/p treatment with Keflex for a total of 7 days

## 2021-12-04 ENCOUNTER — Telehealth: Payer: Self-pay

## 2021-12-04 ENCOUNTER — Ambulatory Visit: Payer: Medicaid Other | Admitting: Dermatology

## 2021-12-04 NOTE — Telephone Encounter (Signed)
Attempted to call and reschedule cancelled [facility] OBC. No answer, and VM is full.

## 2021-12-05 ENCOUNTER — Observation Stay
Admission: AD | Admit: 2021-12-05 | Discharge: 2021-12-05 | Disposition: A | Discharge status: Home / Self Care | Payer: Medicaid Other | Source: Ambulatory Visit | Attending: Obstetrics and Gynecology | Admitting: Obstetrics and Gynecology

## 2021-12-05 ENCOUNTER — Observation Stay: Payer: Medicaid Other

## 2021-12-05 ENCOUNTER — Other Ambulatory Visit: Payer: Self-pay

## 2021-12-05 DIAGNOSIS — Z23 Encounter for immunization: Secondary | ICD-10-CM | POA: Insufficient documentation

## 2021-12-05 DIAGNOSIS — R0602 Shortness of breath: Secondary | ICD-10-CM | POA: Insufficient documentation

## 2021-12-05 DIAGNOSIS — O99342 Other mental disorders complicating pregnancy, second trimester: Secondary | ICD-10-CM | POA: Insufficient documentation

## 2021-12-05 DIAGNOSIS — F99 Mental disorder, not otherwise specified: Secondary | ICD-10-CM | POA: Insufficient documentation

## 2021-12-05 DIAGNOSIS — Z20822 Contact with and (suspected) exposure to covid-19: Secondary | ICD-10-CM | POA: Insufficient documentation

## 2021-12-05 DIAGNOSIS — O34219 Maternal care for unspecified type scar from previous cesarean delivery: Secondary | ICD-10-CM

## 2021-12-05 DIAGNOSIS — O26892 Other specified pregnancy related conditions, second trimester: Principal | ICD-10-CM | POA: Insufficient documentation

## 2021-12-05 DIAGNOSIS — Z87891 Personal history of nicotine dependence: Secondary | ICD-10-CM | POA: Insufficient documentation

## 2021-12-05 DIAGNOSIS — R059 Cough, unspecified: Secondary | ICD-10-CM | POA: Insufficient documentation

## 2021-12-05 DIAGNOSIS — O355XX0 Maternal care for (suspected) damage to fetus by drugs, not applicable or unspecified: Secondary | ICD-10-CM

## 2021-12-05 DIAGNOSIS — N898 Other specified noninflammatory disorders of vagina: Secondary | ICD-10-CM | POA: Insufficient documentation

## 2021-12-05 DIAGNOSIS — O99322 Drug use complicating pregnancy, second trimester: Secondary | ICD-10-CM

## 2021-12-05 DIAGNOSIS — Z8279 Family history of other congenital malformations, deformations and chromosomal abnormalities: Secondary | ICD-10-CM

## 2021-12-05 DIAGNOSIS — Z3A27 27 weeks gestation of pregnancy: Secondary | ICD-10-CM | POA: Insufficient documentation

## 2021-12-05 DIAGNOSIS — O0992 Supervision of high risk pregnancy, unspecified, second trimester: Secondary | ICD-10-CM

## 2021-12-05 LAB — COVID-19 NAAT (PCR): COVID-19 NAAT (PCR): NEGATIVE

## 2021-12-05 LAB — RSV PCR: RSV PCR: 0

## 2021-12-05 LAB — PERFORMING LAB

## 2021-12-05 LAB — PROTEIN,UR + CREAT,UR WITH RATIO
Creatinine,UR: 243 mg/dL (ref 20–300)
Protein,UR: 22 mg/dL — ABNORMAL HIGH (ref 0–11)
TP Creatinine ratio,UR: 0.09

## 2021-12-05 LAB — INFLUENZA A: Influenza A PCR: 0

## 2021-12-05 LAB — INFLUENZA B PCR: Influenza B PCR: 0

## 2021-12-05 MED ORDER — IPRATROPIUM-ALBUTEROL 0.5-2.5 MG/3ML IN SOLN *I*
3.0000 mL | Freq: Once | RESPIRATORY_TRACT | Status: AC
Start: 2021-12-05 — End: 2021-12-05
  Administered 2021-12-05: 3 mL via RESPIRATORY_TRACT
  Filled 2021-12-05: qty 3

## 2021-12-05 MED ORDER — FLUTICASONE PROPIONATE HFA 110 MCG/ACT IN AERO *I*
1.0000 | INHALATION_SPRAY | Freq: Two times a day (BID) | RESPIRATORY_TRACT | 0 refills | Status: DC
Start: 2021-12-05 — End: 2022-01-21
  Filled 2021-12-05: qty 12, 30d supply, fill #0

## 2021-12-05 MED ORDER — ALBUTEROL SULFATE HFA 108 (90 BASE) MCG/ACT IN AERS *I*
1.0000 | INHALATION_SPRAY | Freq: Four times a day (QID) | RESPIRATORY_TRACT | 1 refills | Status: AC | PRN
Start: 2021-12-05 — End: 2022-01-04
  Filled 2021-12-05: qty 8.5, 25d supply, fill #0

## 2021-12-05 MED ORDER — BENZONATATE 100 MG PO CAPS *I*
200.0000 mg | ORAL_CAPSULE | Freq: Once | ORAL | Status: AC
Start: 2021-12-05 — End: 2021-12-05
  Administered 2021-12-05: 200 mg via ORAL
  Filled 2021-12-05: qty 2

## 2021-12-05 MED ORDER — ACETAMINOPHEN 500 MG PO TABS *I*
1000.0000 mg | ORAL_TABLET | Freq: Once | ORAL | Status: AC
Start: 2021-12-05 — End: 2021-12-05
  Administered 2021-12-05: 1000 mg via ORAL
  Filled 2021-12-05: qty 2

## 2021-12-05 MED ORDER — TETANUS-DIPHTH-ACELL PERT 5-2.5-18.5 LF-MCG/0.5 IM SUSP *WRAPPED*
0.5000 mL | Freq: Once | INTRAMUSCULAR | Status: AC
Start: 2021-12-05 — End: 2021-12-05
  Administered 2021-12-05: 0.5 mL via INTRAMUSCULAR
  Filled 2021-12-05: qty 0.5

## 2021-12-05 NOTE — Progress Notes (Signed)
Social Work Note  [name] is known to [employer] from [facility] and her [facility] triage visits.  Pt has a significant psychiatric hx - Ms Leikam has had 2 psychiatric admissions this pregnancy along with several CPEP evaluations. Additionally, Ms. Eduardo has a SUD, she was last + for cocaine on 10/1321.   Ms. Hulgan is currently 27w pregnant.     Ms. Verno reports that she is staying "with people" at [address] [place] and this is where she is requesting to go when she is dischared.  Pt stated that she has a working phone [phone].  SW informed pt that her voicemail was full and showed her how to listen to and delete these messages.    Multiple referrals have been made over the course of this pregnancy to link Ms. Nissen with psychiatric and SUD care. Pt has not linked with any agencies at this time.  SW did f/u with Laurier Nancy at [facility] [id number] who reports that referral was closed due to inability to reach Ms. Tischer for an intake appt.   SW inquired if staff could reach out again with updated contact info- Ms. Leretha Pol planned to check with her supervisor re: this issue.   Plan:  Pt is medically ready for d/c per discussion with CNM Kristi Royko.             SW provided pt with bus passes for her next [facility], encouraged her to attend. Pt is scheduled with SW for follow up.            Awaiting follow up from Laurier Nancy at [facility] re: status of referral.   Loletta Specter, LMSW  [phone]

## 2021-12-05 NOTE — Discharge Instructions (Signed)
Reason for Triage Visit:  cough, shortness of breath  Destination: Home  Mode: Ambulatory    Procedures done in triage: Fetal monitoring and Medications  Pending Laboratory Data: Vaginal culture (s)    Diet: Regular    Activity: Regular activity    Special Instructions: Call or return to triage if you have worsening of any of your symptoms, if you have more than 6 contractions in an hour, if you have vaginal bleeding or leakage of clear fluid, if you have headache or vision changes, if you have right-sided pain, or if you don't feel the baby moving.      If you cannot reach your OB/Gyn or Midwife, call the Labor and Delivery Unit or 911 if it is an emergency.    Follow-up with your [facility] provider as previously instructed.
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
## 2021-12-05 NOTE — Progress Notes (Signed)
Writer reviewed chart for care coordination.  Patient is presently 27w04 pregnant.   Per provider, patient to discharge today.   Appointment scheduled with [facility] for 12/12/2021.    [facility] will continue to follow and coordinate discharge planning needs.  Please contact me with any questions or concerns. Thank you!    Kasandra Knudsen, RN BSN  Acute Care Coordinator [id number] High Risk OB and [address] High Risk Labor/Delivery  [phone]    Pager [phone]  Weekend coverage pager [phone]

## 2021-12-05 NOTE — OB Triage Note (Signed)
Pt into triage via EMS with c/o abdominal pain, SOB, DFM, pt endorses - VB, - LOF, - FM, - ctxs. VSS, CEFM applied, FHR reassuring in 130s. Pt oriented to unit and call bell system, pt rights provided. Dr. Earnest Conroy notified and in to see pt.  Wilhemina Cash, RN

## 2021-12-05 NOTE — OB Triage Note (Signed)
[name] is requesting nebulizer.  Lungs CTA and no notable dyspnea or retractions.  AO x3, appears relaxed, speaks full sentences w/o cough or issue.    Patient Vitals for the past 72 hrs:   BP Temp Temp src Pulse Resp SpO2   12/05/21 0403 119/60 36.1 C (97 F) TEMPORAL 79 16 99 %     Will d/c home after duo-neb.  Rx for flovent BID and albuterol (meds provided prior to d/c). Instructed on use and warning signs.  Given [name] is eval'd in triage for cough/shortness of breath and she has significant community exposure, flu/rsv/covid cultures are ordered and I will follow these results.    Prelim [place] results reassuring per Lesly Rubenstein, sonographer  S/P SW consult w/ Marcelino Duster.    Donnalee Curry, CNM FNP-BC  12/05/2021  10:54 AM

## 2021-12-06 LAB — CHLAMYDIA NAAT (PCR): Chlamydia Plasmid DNA Amplification: 0

## 2021-12-06 LAB — N. GONORRHOEAE NAAT (PCR): N. gonorrhoeae DNA Amplification: 0

## 2021-12-06 LAB — VAGINITIS SCREEN: DNA PROBE: Vaginitis Screen:DNA Probe: 0

## 2021-12-12 ENCOUNTER — Ambulatory Visit: Payer: Medicaid Other

## 2021-12-17 NOTE — Progress Notes (Signed)
Social Work Note  SW had reached out to [employer]	[place] [phone]) at [facility] during pt's last Triage to check the status of pt's referral.  Per Ms. Leretha Pol, Ms. Humm is currently on the waiting list which is several months long.  Pregnancy does not expedite referral.  However, if pt has psychosis during a visit in clinic or an admission and [facility] is contacted, intake can potentially be moved up to a sooner date.   Loletta Specter, [place]  [id number] [phone]

## 2021-12-28 ENCOUNTER — Emergency Department
Admission: EM | Admit: 2021-12-28 | Discharge: 2021-12-28 | Disposition: A | Discharge status: Home / Self Care | Payer: Medicaid Other | Source: Ambulatory Visit | Attending: Emergency Medicine | Admitting: Emergency Medicine

## 2021-12-28 DIAGNOSIS — O26893 Other specified pregnancy related conditions, third trimester: Secondary | ICD-10-CM

## 2021-12-28 DIAGNOSIS — O99343 Other mental disorders complicating pregnancy, third trimester: Secondary | ICD-10-CM | POA: Insufficient documentation

## 2021-12-28 DIAGNOSIS — F4481 Dissociative identity disorder: Secondary | ICD-10-CM | POA: Insufficient documentation

## 2021-12-28 DIAGNOSIS — R0602 Shortness of breath: Secondary | ICD-10-CM

## 2021-12-28 DIAGNOSIS — R21 Rash and other nonspecific skin eruption: Secondary | ICD-10-CM

## 2021-12-28 DIAGNOSIS — Z5902 Unsheltered unhousedness: Secondary | ICD-10-CM | POA: Insufficient documentation

## 2021-12-28 DIAGNOSIS — R0981 Nasal congestion: Secondary | ICD-10-CM

## 2021-12-28 DIAGNOSIS — Z3490 Encounter for supervision of normal pregnancy, unspecified, unspecified trimester: Secondary | ICD-10-CM

## 2021-12-28 DIAGNOSIS — F4310 Post-traumatic stress disorder, unspecified: Secondary | ICD-10-CM | POA: Insufficient documentation

## 2021-12-28 DIAGNOSIS — L209 Atopic dermatitis, unspecified: Secondary | ICD-10-CM | POA: Insufficient documentation

## 2021-12-28 DIAGNOSIS — Z3A30 30 weeks gestation of pregnancy: Secondary | ICD-10-CM | POA: Insufficient documentation

## 2021-12-28 DIAGNOSIS — R059 Cough, unspecified: Secondary | ICD-10-CM

## 2021-12-28 DIAGNOSIS — O209 Hemorrhage in early pregnancy, unspecified: Secondary | ICD-10-CM

## 2021-12-28 DIAGNOSIS — Z9142 Personal history of forced labor or sexual exploitation: Secondary | ICD-10-CM | POA: Insufficient documentation

## 2021-12-28 DIAGNOSIS — O99713 Diseases of the skin and subcutaneous tissue complicating pregnancy, third trimester: Secondary | ICD-10-CM | POA: Insufficient documentation

## 2021-12-28 LAB — BASIC METABOLIC PANEL
Anion Gap: 12 (ref 7–16)
CO2: 21 mmol/L (ref 20–28)
Calcium: 8.3 mg/dL — ABNORMAL LOW (ref 8.8–10.2)
Chloride: 106 mmol/L (ref 96–108)
Creatinine: 0.53 mg/dL (ref 0.51–0.95)
Glucose: 98 mg/dL (ref 60–99)
Lab: 12 mg/dL (ref 6–20)
Potassium: 3.7 mmol/L (ref 3.3–5.1)
Sodium: 139 mmol/L (ref 133–145)
eGFR BY CREAT: 127 *

## 2021-12-28 LAB — GRAM STAIN

## 2021-12-28 LAB — RUQ PANEL (ED ONLY)
ALT: 15 U/L (ref 0–35)
AST: 18 U/L (ref 0–35)
Albumin: 3.4 g/dL — ABNORMAL LOW (ref 3.5–5.2)
Alk Phos: 93 U/L (ref 35–105)
Amylase: 87 U/L (ref 28–100)
Bili,Indirect: 0.6 mg/dL (ref 0.1–1.0)
Bilirubin,Direct: 0.3 mg/dL (ref 0.0–0.3)
Bilirubin,Total: 0.9 mg/dL (ref 0.0–1.2)
Lipase: 26 U/L (ref 13–60)
Total Protein: 5.5 g/dL — ABNORMAL LOW (ref 6.3–7.7)

## 2021-12-28 LAB — CBC AND DIFFERENTIAL
Baso # K/uL: 0 10*3/uL (ref 0.0–0.1)
Basophil %: 0.3 %
Eos # K/uL: 0.2 10*3/uL (ref 0.0–0.4)
Eosinophil %: 2.5 %
Hematocrit: 28 % — ABNORMAL LOW (ref 34–45)
Hemoglobin: 9.2 g/dL — ABNORMAL LOW (ref 11.2–15.7)
IMM Granulocytes #: 0.1 10*3/uL — ABNORMAL HIGH (ref 0.0–0.0)
IMM Granulocytes: 1.2 %
Lymph # K/uL: 1.2 10*3/uL (ref 1.2–3.7)
Lymphocyte %: 12.7 %
MCH: 32 pg (ref 26–32)
MCHC: 33 g/dL (ref 32–36)
MCV: 96 fL — ABNORMAL HIGH (ref 79–95)
Mono # K/uL: 0.8 10*3/uL (ref 0.2–0.9)
Monocyte %: 8.8 %
Neut # K/uL: 6.8 10*3/uL — ABNORMAL HIGH (ref 1.6–6.1)
Nucl RBC # K/uL: 0 10*3/uL (ref 0.0–0.0)
Nucl RBC %: 0 /100 WBC (ref 0.0–0.2)
Platelets: 201 10*3/uL (ref 160–370)
RBC: 2.9 MIL/uL — ABNORMAL LOW (ref 3.9–5.2)
RDW: 12.8 % (ref 11.7–14.4)
Seg Neut %: 74.5 %
WBC: 9.1 10*3/uL (ref 4.0–10.0)

## 2021-12-28 LAB — HOLD GREEN WITH GEL

## 2021-12-28 MED ORDER — ESCITALOPRAM OXALATE 10 MG PO TABS *I*
5.0000 mg | ORAL_TABLET | Freq: Every day | ORAL | Status: DC
Start: 2021-12-28 — End: 2021-12-29
  Administered 2021-12-28: 5 mg via ORAL
  Filled 2021-12-28 (×2): qty 1

## 2021-12-28 MED ORDER — RISPERIDONE 1 MG PO TABS *I*
1.0000 mg | ORAL_TABLET | Freq: Once | ORAL | Status: AC
Start: 2021-12-28 — End: 2021-12-28
  Administered 2021-12-28: 1 mg via ORAL
  Filled 2021-12-28: qty 1
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
## 2021-12-28 NOTE — Discharge Instructions (Signed)
You were seen in the emergency department due to concern for your pregnancy. You were evaluated by OB and psychiatry while in the emergency department. They have recommended outpatient follow-up.    Please follow-up with your primary OB in the next week.    Return to the emergency department if you have any additional concerns.
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
## 2021-12-28 NOTE — ED Notes (Signed)
Discharge instructions and follow up care discussed with patient, patient verbalized understanding at this time. Pt denies any questions regarding follow up care or prescription changes. VSS, A&Ox4, ambulatory independently, belongings taken with patient, dressed appropriately.
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
## 2021-12-28 NOTE — ED Provider Progress Notes (Signed)
ED Provider Progress Note    Received signout from day providers, briefly:    [name] is a 30 y.o. female homeless, identifying as Alexis Vang, has dissociative disorder, [redacted] weeks pregnant with abdominal pain and vaginal spotting, here today with SI  However, now admits to making SI statements to get medical care  Rash on hands/feet - Derm consulted, ? scabies  SW aware, has room at [facility] shelter  Needs psych? Given home risperidone    []  SW vs CPE[facility] vs 19200        ED Course as of 12/28/21 2058   Sat Dec 28, 2021   1514 CBC and differential(!)  Unremarkable   1514 Basic metabolic panel(!)  Unremarkable   1514 RUQ panel(!)  Unremarkable   1517 Derm recommending empiric treatment for scabies      Patient evaluated by psychiatry and psychiatrically cleared.  On reassessment, patient alert, oriented, and behaving appropriately.  Denies any SI or HI. Has no acute complaints at this time. Discussed outpatient follow-up with OB.  Patient comfortable with plan.  Reports she has an aunt who she can stay with.  Discharged with return precautions provided.     Dec 30, 2021, MD, 12/28/2021, 3:13 PM     12/30/2021, MD  Resident  12/28/21 2100

## 2021-12-28 NOTE — Progress Notes (Addendum)
Brief Progress Note     Called by ED attending. Patient is a 30 y.o. [id number] at 30w with a history of PTSD with dissociative features and multiple personality disorder and psychosis, substance abuse. The patient is being admitted to the med-psych service who requested approval for admission to their service from an obstetrics perspective. The patient does not have any obstetric complaints. Patient is ok for admission to psychiatry. Please reach out if there are any obstetrical concerns.     D/w Dr. Hinton Lovely, MD  OB/GYN Resident, PGY1   Pager [phone]

## 2021-12-28 NOTE — ED Notes (Signed)
OB notified of patient arrival to ED.

## 2021-12-29 LAB — HIV-1/2  ANTIGEN/ANTIBODY SCREEN WITH CONFIRMATION: HIV 1&2 ANTIGEN/ANTIBODY: NONREACTIVE

## 2021-12-29 LAB — SYPHILIS SCREEN
Syphilis Screen: NEGATIVE
Syphilis Status: NONREACTIVE

## 2021-12-29 NOTE — L&D Delivery Note (Signed)
Delivery Summary Note  Patient: Alexis Vang  Age: 31 y.o.  Date of Birth: [birth year]  [id number]  MRN: [id number]    Admission Summary  Date and time of admission: 02/27/2022 12:16 PM Attending Provider: Dessie Coma, DO Provider Group: [facility]  Active Hospital Problems    Diagnosis     *!*History of cesarean delivery      Allergies:   Allergies: No Known Allergies (drug, envir, food or latex)  Weight:          OB History   Gravida Para Term Preterm AB Living   4 3 2 1 1 2    SAB IAB Ectopic Multiple Live Births   0 0 1 0 3      Breast or Formula Feeding: Breast feeding      Prenatal Labs  ABO RH Blood Type   Date Value Ref Range Status   02/27/2022 A RH POS  Final     Antibody Screen   Date Value Ref Range Status   02/27/2022 Negative  Final     Rubella IgG AB   Date Value Ref Range Status   11/09/2021 Negative  Final     Comment:     TEST METHOD: Multiplex flow immunoassay     HBV Core Ab   Date Value Ref Range Status   01/23/2022 NEG NEG Final     Comment:     Test Method: CMIA     HBV S Ab   Date Value Ref Range Status   01/23/2022 NEG  Final     Comment:     Test Method: CMIA     HBV S Ab Quant   Date Value Ref Range Status   01/23/2022 1.17 mIU/mL Final     Comment:     Immunity to HBV is implied by anti-HBs values greater than  or equal to 12 mIU/mL. This result may represent the antibody  response to successful HBV immunization, to past HBV  infection or passively acquired antibody (e.g.tranfusion).  Post-immunization antibody testing guidelines for the general  public are described in MMWR 1990;39(S2):1-23 and for health-  care workers in MMWR 1997;46(No.RR-18).  Interpretive Criteria:  < 8.0 mIU/mL           Negative  8.0 to 12.0 mIU/mL     Indeterminate  12.0 to > 1000 mIU/mL  Positive  Test Method:CMIA       HBV S Ag   Date Value Ref Range Status   01/23/2022 NEG NEG Final     Comment:     Test Method: CMIA     Group B Strep Culture   Date Value Ref Range Status   02/20/2022 Streptococcus agalactiae (Group  B) detected (!)  Final     Comment:     Organism identified from broth culture by amplification.     HIV 1&2 ANTIGEN/ANTIBODY   Date Value Ref Range Status   01/23/2022 Nonreactive Nonreactive Final     Comment:     Test Method: CMIA      Dating Information  No LMP recorded. EDD: 03/06/2022, by Ultrasound  Information for the patient's newborn:  Deserie, Dirks Humphrey Rolls     Delivery Information  Boy Cara  Sex: female Gestational Age: [redacted]w[redacted]d MRN: [redacted]w[redacted]d PCP: Provider, Unknown   Delivery Date/Time: 02/27/2022 6:51 PM   Time of Head Delivery: 02/27/2022  6:51 PM  Delivery Type: C-Sec, Low Transverse          Delivery Location:      Labor  Onset Date/Time:     Dilation Complete Date/Time:         Preterm labor:   Antenatal steroids:   Antibiotics received during labor:        First Cervical ripening date/time:       Cervical ripening Type:          Rupture Date: 02/27/2022 Rupture Time: 6:51 PM  Details:   Rupture Type: Intact Color: Meconium Amount: Moderate Fluid odor:None  Induction: .none           Augmentation: .None      Labor complications: None     Providers     Other personnel:  Provider Role   Meda Coffee, NP NICU/SCN NP   Dickey Gave, DO NICU/SCN Resident   Roetta Sessions, RN Registered Nurse (RN)           Anesthesia Method:     Analgesics:        Presentation:   Position:                                                                                                        Skin to Skin/Bonding:                 Resuscitation: Dry;Tactile Stimulation;Bulb Suctioning     Living Status: Living             APGARs Total Color Reflex irritability Breath Heart Rate Muscle Tone Assigned By   (greater than 7 no need for next measurement)   1 min 7  0  1  2  2  2      5  min 9  1  2  2  2  2      10  min                 15 min                 20 min                 25 min                 30 min                   Birth Weight: 2977 g (6 lb 9 oz) Height: 20" Head Circumference: 34.3 cm Observed Anomalies:       Cord:       Complications:            Clamped Date/Time:   Cord blood disposition:       Gases sent:       Maternal Info:   Placenta Delivery Date/Time:       Removal:       Appearance:       Bonding:     Stages of Labor:          Stage One:   h   m          Stage Two:   h   m  Stage Three:   h   m  Episiotomy:        Lacerations:                                                    Procedures:                        Intraprocedure I/O Totals     None         Alexis Vang is a 31 y.o. now [id number], who was admitted on 02/27/2022 at [redacted]w[redacted]d for planned repeat LTCS. She received Ancef for antibiotic prophylaxis and spinal anesthesia. She delivered a viable female infant in the vertex position, weighing 2977 grams, with APGARs of 7 and 9. The infant's mouth and nares were bulb suctioned on the maternal abdomen, the cord was clamped and cut, and the infant was passed to awaiting personnel active and crying.  For full details, please see operative note.  Mother and infant tolerated the delivery well.  EBL 560 cc.    Glennie Isle, MD, MPH  Obstetrics & Gynecology, PGY-1   Pager [phone]

## 2021-12-30 ENCOUNTER — Telehealth: Payer: Self-pay | Admitting: Pediatrics

## 2021-12-30 LAB — AEROBIC CULTURE

## 2021-12-30 LAB — TRICHOMONAS NAAT (PCR): Trichomonas DNA amplification: 0

## 2021-12-30 LAB — N. GONORRHOEAE NAAT (PCR): N. gonorrhoeae DNA Amplification: 0

## 2021-12-30 LAB — CHLAMYDIA NAAT (PCR): Chlamydia Plasmid DNA Amplification: 0

## 2021-12-30 NOTE — Telephone Encounter (Signed)
Called patient at phone number in chart. A woman answered, and when I asked for Alexis Vang, she said yes. Of note, the person who answered the phone sounded like the patient we saw in person in the ED 2 days ago. I then began discussing the culture results with the patient and our recommendation for mupirocin plus 3 other topical ointments for her skin. She then stated that she "will let her know." Then I asked if this was Alexis Vang again, and she said that "this is her friend." I asked what her name was, and she said "I'm a friend." Then I asked if [name] Mullens was there, and she said "I don't know that person," or "I may know but she's not nice." Then the person stated that Alexis Vang was asleep, and asked if I could call during "normal business hours." I provided the person on the phone with the clinic phone number. I stated I would also try calling tomorrow. The person on the phone was not interested in calling us tomorrow and said that she will make sure Alexis Vang answers her phone as long as it is normal business hours. Then she ended the call.      Carolyne Littles, MD  Dermatology Resident

## 2021-12-31 ENCOUNTER — Other Ambulatory Visit: Payer: Self-pay

## 2021-12-31 ENCOUNTER — Telehealth: Payer: Self-pay | Admitting: Pediatrics

## 2021-12-31 LAB — HEPATITIS C RNA, QUANTITATIVE, PCR
HCV PCR log: NOT DETECTED IU/mL
HCV PCR: NOT DETECTED IU/mL

## 2021-12-31 LAB — AEROBIC CULTURE

## 2021-12-31 MED ORDER — MUPIROCIN 2 % EX OINT *I*
TOPICAL_OINTMENT | Freq: Three times a day (TID) | CUTANEOUS | 1 refills | Status: AC
Start: 2021-12-31 — End: 2022-01-14
  Filled 2021-12-31: qty 22, 14d supply, fill #0

## 2021-12-31 MED ORDER — TRIAMCINOLONE ACETONIDE 0.1 % EX CREA *I*
TOPICAL_CREAM | Freq: Two times a day (BID) | CUTANEOUS | 1 refills | Status: DC | PRN
Start: 2021-12-31 — End: 2022-01-21
  Filled 2021-12-31: qty 454, 30d supply, fill #0

## 2021-12-31 MED ORDER — PERMETHRIN 5 % EX CREA *I*
TOPICAL_CREAM | CUTANEOUS | 0 refills | Status: AC
Start: 2021-12-31 — End: 2022-01-08
  Filled 2021-12-31: qty 120, 30d supply, fill #0

## 2021-12-31 MED ORDER — CLOBETASOL PROPIONATE 0.05 % EX CREA *I*
TOPICAL_CREAM | Freq: Two times a day (BID) | CUTANEOUS | 1 refills | Status: DC | PRN
Start: 2021-12-31 — End: 2022-01-21
  Filled 2021-12-31: qty 60, 30d supply, fill #0

## 2021-12-31 NOTE — Telephone Encounter (Signed)
Called patient and relayed culture results on 12/31/21 at 3:30PM.  Discussed plan of care extensively and repeated the plan many times to the patient who expressed her understanding.    Patient identified herself as "Alexis Vang." She states that in the hospital, they put a wristband on that says [place] so that she can get her health insurance.    I relayed the culture results of her hand and right thigh. I advised she use mupirocin ointment to the lesions for 10-14 days. She asked about oral medications as this is easier for her but I explained that because she is pregnant, none of the oral medications listed on the culture would be safe for her to use. Also, she likely has bacterial dysbiosis in part 2/2 atopic dermatitis and so treating the AD will improve the bacterial microbiome. She was in agreement.    She also stated that she would be willing to use clobetasol and triamcinolone, but prefers the cream formulation. I discussed how to use these appropriately and will specify the use in the prescription as well.    Finally, I discussed that while we did not see scabies when we scraped, we would recommend treatment for scabies empirically with permethrin. The patient is in agreement and has used this before. Discussed washing/drying or bagging her clothing to prevent reinfestation. It looks like she has had this counseling in the past in 08/2021 so she was familiar.    Our office will call to schedule a follow-up in 2 weeks with Dr. Bethann Berkshire. She is in agreement.    She started asking about dupixent. States she has been on it in the past for her eczema. In the ED, when asked if she had atopic dermatitis, she wasn't sure, but she was identifying herself as Alexis Vang. Today, as "Alexis Vang", she has known AD. Advised that we could discuss this at her next follow-up visit.    The patient asks when her next [facility] appointment is. I advise there is none scheduled in our system. I provided the patient with the OB  phone number in her recent "no show" appointment - [phone].    The patient also asks for assistance with a PCP. I provided her with the [facility] phone number to establish care with PCP - [phone].    Of note, throughout the visit, the patient states she "can't hear well" and continues repeating things.       See telephone note on 12/31/21 for order details.      Heywood Iles, MD  Dermatology Resident

## 2021-12-31 NOTE — Telephone Encounter (Addendum)
Called patient and relayed culture results.  Discussed plan of care extensively and repeated the plan many times.    Patient identified herself as "Alexis Vang." She states that in the hospital, they put a wristband on that says [place] so that she can get her health insurance.    I relayed the culture results of her hand and right thigh. I advised she use mupirocin ointment to the lesions for 10-14 days. She asked about oral medications as this is easier for her but I explained that because she is pregnant, none of the oral medications listed on the culture would be safe for her to use. Also, she likely has bacterial dysbiosis in part 2/2 atopic dermatitis and so treating the AD will improve the bacterial microbiome. She was in agreement.    She also stated that she would be willing to use clobetasol and triamcinolone, but prefers the cream formulation. I discussed how to use these appropriately and will specify the use in the prescription as well.    Finally, I discussed that while we did not see scabies when we scraped, we would recommend treatment for scabies empirically with permethrin. The patient is in agreement and has used this before. Discussed washing/drying or bagging her clothing to prevent reinfestation. It looks like she has had this counseling in the past in 08/2021 so she was familiar.    When asked what pharmacy, she says she isn't familiar with [place] and doesn't know. Asks what pharmacy I suggest. Per chart review, she has used [facility] so I stated I would send prescriptions there. She agreed.    Our office will call to schedule a follow-up in 2 weeks with Dr. Bethann Berkshire. She is in agreement.    She started asking about dupixent. States she has been on it in the past for her eczema. In the ED, when asked if she had atopic dermatitis, she wasn't sure, but she was identifying herself as Alexis Vang. Today, as "Alexis Vang", she has known AD. Advised that we could discuss this  at her next follow-up visit.    The patient asks when her next [facility] appointment is. I advise there is none scheduled in our system. I provided the patient with the OB phone number in her recent "no show" appointment - [phone].    The patient also asks for assistance with a PCP. I provided her with the [facility] phone number to establish care with PCP - [phone].    Of note, throughout the visit, the patient states she "can't hear well" and continues repeating things.       Heywood Iles, MD  Dermatology Resident

## 2022-01-01 ENCOUNTER — Telehealth: Payer: Self-pay

## 2022-01-01 ENCOUNTER — Other Ambulatory Visit: Payer: Self-pay

## 2022-01-01 ENCOUNTER — Encounter: Payer: Self-pay | Admitting: Emergency Medicine

## 2022-01-01 NOTE — Telephone Encounter (Signed)
Called PT to schedule 2 week FUV with Dr. Bethann Berkshire for ED fuv

## 2022-01-01 NOTE — Telephone Encounter (Signed)
Told patient was not avaiable and to call back later.

## 2022-01-02 ENCOUNTER — Other Ambulatory Visit: Payer: Self-pay

## 2022-01-02 ENCOUNTER — Encounter: Payer: Self-pay | Admitting: Emergency Medicine

## 2022-01-02 LAB — AEROBIC CULTURE

## 2022-01-06 NOTE — Telephone Encounter (Signed)
Patient was called, but unable to LM. Will try again later.

## 2022-01-10 ENCOUNTER — Other Ambulatory Visit: Payer: Self-pay

## 2022-01-12 ENCOUNTER — Other Ambulatory Visit: Payer: Self-pay

## 2022-01-12 LAB — UNMAPPED LAB RESULTS
Basophil # (HT): 0 10 3/uL (ref 0.0–0.2)
Basophil % (HT): 0 % (ref 0–2)
Eosinophil # (HT): 0.5 10 3/uL (ref 0.0–0.5)
Eosinophil % (HT): 6 % (ref 0–7)
Hematocrit (HT): 28 % — ABNORMAL LOW (ref 40–52)
Hemoglobin (HGB) (HT): 9.1 g/dL — ABNORMAL LOW (ref 11.5–16.0)
Lymphocyte # (HT): 2 10 3/uL (ref 0.9–3.8)
Lymphocyte % (HT): 25 % (ref 17–44)
MCHC (HT): 32.5 g/dL (ref 32.0–36.0)
MCV (HT): 95 fL (ref 81–99)
Mean Corpuscular Hemoglobin (MCH) (HT): 30.8 pg (ref 26.0–34.0)
Monocyte # (HT): 0.8 10 3/uL (ref 0.2–1.0)
Monocyte % (HT): 10 % (ref 4–15)
Neutrophil # (HT): 4.3 10 3/uL (ref 1.5–7.7)
Platelets (HT): 209 10 3/uL (ref 140–400)
RBC (HT): 2.95 10 6/uL — ABNORMAL LOW (ref 3.80–5.20)
RDW (HT): 13 % (ref 11.5–15.0)
Seg Neut % (HT): 56 % (ref 43–75)
WBC (HT): 7.7 10 3/uL (ref 4.0–10.0)

## 2022-01-16 NOTE — Progress Notes (Signed)
Encounter opened in error

## 2022-01-21 ENCOUNTER — Encounter: Payer: Self-pay | Admitting: Student in an Organized Health Care Education/Training Program

## 2022-01-21 ENCOUNTER — Observation Stay
Admission: EM | Admit: 2022-01-21 | Discharge: 2022-01-25 | Disposition: A | Discharge status: Home / Self Care | Source: Ambulatory Visit | Attending: Psychiatry | Admitting: Psychiatry

## 2022-01-21 ENCOUNTER — Other Ambulatory Visit: Payer: Self-pay

## 2022-01-21 DIAGNOSIS — O368130 Decreased fetal movements, third trimester, not applicable or unspecified: Secondary | ICD-10-CM

## 2022-01-21 DIAGNOSIS — Z1159 Encounter for screening for other viral diseases: Secondary | ICD-10-CM | POA: Insufficient documentation

## 2022-01-21 DIAGNOSIS — R109 Unspecified abdominal pain: Secondary | ICD-10-CM

## 2022-01-21 DIAGNOSIS — X58XXXA Exposure to other specified factors, initial encounter: Secondary | ICD-10-CM | POA: Insufficient documentation

## 2022-01-21 DIAGNOSIS — O99323 Drug use complicating pregnancy, third trimester: Principal | ICD-10-CM | POA: Insufficient documentation

## 2022-01-21 DIAGNOSIS — R102 Pelvic and perineal pain: Secondary | ICD-10-CM | POA: Insufficient documentation

## 2022-01-21 DIAGNOSIS — F1990 Other psychoactive substance use, unspecified, uncomplicated: Secondary | ICD-10-CM

## 2022-01-21 DIAGNOSIS — Z59 Unhousedness unspecified: Secondary | ICD-10-CM | POA: Insufficient documentation

## 2022-01-21 DIAGNOSIS — R9431 Abnormal electrocardiogram [ECG] [EKG]: Secondary | ICD-10-CM

## 2022-01-21 DIAGNOSIS — F1490 Cocaine use, unspecified, uncomplicated: Secondary | ICD-10-CM | POA: Insufficient documentation

## 2022-01-21 DIAGNOSIS — Y9289 Other specified places as the place of occurrence of the external cause: Secondary | ICD-10-CM | POA: Insufficient documentation

## 2022-01-21 DIAGNOSIS — O26893 Other specified pregnancy related conditions, third trimester: Secondary | ICD-10-CM

## 2022-01-21 DIAGNOSIS — R519 Headache, unspecified: Secondary | ICD-10-CM | POA: Insufficient documentation

## 2022-01-21 DIAGNOSIS — Z3A33 33 weeks gestation of pregnancy: Secondary | ICD-10-CM

## 2022-01-21 DIAGNOSIS — Z114 Encounter for screening for human immunodeficiency virus [HIV]: Secondary | ICD-10-CM | POA: Insufficient documentation

## 2022-01-21 DIAGNOSIS — Y998 Other external cause status: Secondary | ICD-10-CM | POA: Insufficient documentation

## 2022-01-21 DIAGNOSIS — Z113 Encounter for screening for infections with a predominantly sexual mode of transmission: Secondary | ICD-10-CM | POA: Insufficient documentation

## 2022-01-21 DIAGNOSIS — T7621XA Adult sexual abuse, suspected, initial encounter: Secondary | ICD-10-CM | POA: Insufficient documentation

## 2022-01-21 DIAGNOSIS — F1290 Cannabis use, unspecified, uncomplicated: Secondary | ICD-10-CM | POA: Insufficient documentation

## 2022-01-21 DIAGNOSIS — Y9389 Activity, other specified: Secondary | ICD-10-CM | POA: Insufficient documentation

## 2022-01-21 DIAGNOSIS — R45851 Suicidal ideations: Secondary | ICD-10-CM | POA: Insufficient documentation

## 2022-01-21 DIAGNOSIS — Z789 Other specified health status: Secondary | ICD-10-CM

## 2022-01-21 DIAGNOSIS — Z3490 Encounter for supervision of normal pregnancy, unspecified, unspecified trimester: Secondary | ICD-10-CM

## 2022-01-21 DIAGNOSIS — F4310 Post-traumatic stress disorder, unspecified: Secondary | ICD-10-CM

## 2022-01-21 DIAGNOSIS — Z79899 Other long term (current) drug therapy: Secondary | ICD-10-CM | POA: Insufficient documentation

## 2022-01-21 LAB — CBC AND DIFFERENTIAL
Baso # K/uL: 0.1 10*3/uL (ref 0.0–0.1)
Basophil %: 0.5 %
Eos # K/uL: 0.3 10*3/uL (ref 0.0–0.4)
Eosinophil %: 3 %
Hematocrit: 30 % — ABNORMAL LOW (ref 34–45)
Hemoglobin: 9.9 g/dL — ABNORMAL LOW (ref 11.2–15.7)
IMM Granulocytes #: 0.2 10*3/uL — ABNORMAL HIGH (ref 0.0–0.0)
IMM Granulocytes: 1.8 %
Lymph # K/uL: 1.3 10*3/uL (ref 1.2–3.7)
Lymphocyte %: 13.5 %
MCH: 32 pg (ref 26–32)
MCHC: 33 g/dL (ref 32–36)
MCV: 96 fL — ABNORMAL HIGH (ref 79–95)
Mono # K/uL: 1 10*3/uL — ABNORMAL HIGH (ref 0.2–0.9)
Monocyte %: 9.8 %
Neut # K/uL: 6.9 10*3/uL — ABNORMAL HIGH (ref 1.6–6.1)
Nucl RBC # K/uL: 0 10*3/uL (ref 0.0–0.0)
Nucl RBC %: 0 /100 WBC (ref 0.0–0.2)
Platelets: 231 10*3/uL (ref 160–370)
RBC: 3.1 MIL/uL — ABNORMAL LOW (ref 3.9–5.2)
RDW: 13.1 % (ref 11.7–14.4)
Seg Neut %: 71.4 %
WBC: 9.7 10*3/uL (ref 4.0–10.0)

## 2022-01-21 LAB — SALICYLATE LEVEL: Salicylate: <3 mg/dL — ABNORMAL LOW (ref 15.0–30.0)

## 2022-01-21 LAB — RUQ PANEL (ED ONLY)
ALT: 17 U/L (ref 0–35)
AST: 28 U/L (ref 0–35)
Albumin: 3.7 g/dL (ref 3.5–5.2)
Alk Phos: 132 U/L — ABNORMAL HIGH (ref 35–105)
Amylase: 106 U/L — ABNORMAL HIGH (ref 28–100)
Bili,Indirect: 0.5 mg/dL (ref 0.1–1.0)
Bilirubin,Direct: 0.3 mg/dL (ref 0.0–0.3)
Bilirubin,Total: 0.8 mg/dL (ref 0.0–1.2)
Lipase: 19 U/L (ref 13–60)
Total Protein: 6.1 g/dL — ABNORMAL LOW (ref 6.3–7.7)

## 2022-01-21 LAB — EKG 12-LEAD
P: 26 deg
PR: 171 ms
QRS: 66 deg
QRSD: 91 ms
QT: 361 ms
QTc: 435 ms
Rate: 87 {beats}/min
T: 138 deg

## 2022-01-21 LAB — DRUG SCREEN CHEMICAL DEPENDENCY, URINE
Amphetamine,UR: NEGATIVE
Benzodiazepinen,UR: NEGATIVE
Cocaine/Metab,UR: POSITIVE
Fentanyl, UR: NEGATIVE ng/mL
Opiates,UR: NEGATIVE
THC Metabolite,UR: POSITIVE

## 2022-01-21 LAB — BASIC METABOLIC PANEL
Anion Gap: 13 (ref 7–16)
CO2: 20 mmol/L (ref 20–28)
Calcium: 9 mg/dL (ref 8.8–10.2)
Chloride: 106 mmol/L (ref 96–108)
Creatinine: 0.73 mg/dL (ref 0.51–0.95)
Glucose: 71 mg/dL (ref 60–99)
Lab: 6 mg/dL (ref 6–20)
Potassium: 3.9 mmol/L (ref 3.3–5.1)
Sodium: 139 mmol/L (ref 133–145)
eGFR BY CREAT: 113 *

## 2022-01-21 LAB — HOLD GREEN WITH GEL

## 2022-01-21 LAB — ACETAMINOPHEN LEVEL: Acetaminophen: <5 ug/mL

## 2022-01-21 LAB — ETHANOL: Ethanol: <10 mg/dL (ref 0–9)

## 2022-01-21 LAB — HOLD SST

## 2022-01-21 MED ORDER — PERMETHRIN 5 % EX CREA *I*
TOPICAL_CREAM | Freq: Once | CUTANEOUS | Status: DC
Start: 2022-01-21 — End: 2022-01-21

## 2022-01-21 MED ORDER — CEPHALEXIN 500 MG PO CAPS *I*
500.0000 mg | ORAL_CAPSULE | Freq: Two times a day (BID) | ORAL | Status: DC
Start: 2022-01-21 — End: 2022-01-25
  Administered 2022-01-22 – 2022-01-25 (×7): 500 mg via ORAL
  Filled 2022-01-21 (×13): qty 1

## 2022-01-21 MED ORDER — ESCITALOPRAM OXALATE 10 MG PO TABS *I*
20.0000 mg | ORAL_TABLET | Freq: Every day | ORAL | Status: DC
Start: 2022-01-22 — End: 2022-01-25
  Administered 2022-01-22 – 2022-01-25 (×4): 20 mg via ORAL
  Filled 2022-01-21 (×4): qty 2
  Filled 2022-01-21: qty 1

## 2022-01-21 MED ORDER — PRENATAL PLUS IRON 29-1 MG PO TABS *I*
1.0000 | ORAL_TABLET | Freq: Every day | ORAL | Status: DC
Start: 2022-01-22 — End: 2022-01-25
  Administered 2022-01-22 – 2022-01-25 (×3): 1 via ORAL
  Filled 2022-01-21 (×4): qty 1

## 2022-01-21 MED ORDER — TRIAMCINOLONE ACETONIDE 0.1 % EX CREA *I*
TOPICAL_CREAM | Freq: Two times a day (BID) | CUTANEOUS | Status: DC
Start: 2022-01-21 — End: 2022-01-25
  Filled 2022-01-21 (×2): qty 15

## 2022-01-21 NOTE — ED Provider Notes (Addendum)
History     Chief Complaint   Patient presents with   . Suicidal   . Drug / Alcohol Assessment   . Pregnancy Problem     Alexis Vang is a 31 y.o. female [redacted] week pregnant w/ hx anxiety, asthma, depression, PTSD  pregnant presents with abdominal pain.  Patient states she has had abdominal pain for the past day accompanied with spotting 3 to 4 days ago.  States she is also been having thoughts of killing herself endorses taking heroin, crack, cocaine, meth, K2, marijuana and alcohol in an attempt to kill her self this morning.  Also states she has been not urinating as much is normal.  States she has not been eating and drinking as much is normal.            Medical/Surgical/Family History     Past Medical History:   Diagnosis Date   . Anxiety    . Asthma    . Depression    . Gestational diabetes    . Preeclampsia    . Scabies    . Substance abuse         Patient Active Problem List   Diagnosis Code   . Paranoia F22   . Psychosis, unspecified psychosis type F29   . Scabies B86   . High-risk pregnancy O09.90   . PTSD (post-traumatic stress disorder) F43.10   . History of cesarean section complicating pregnancy O34.219   . H/O fetal anomaly in prior pregnancy, currently pregnant O09.299   . History of gestational diabetes in prior pregnancy, currently pregnant O09.299, Z76.32   . Hx of preeclampsia, prior pregnancy, currently pregnant O09.299   . Substance use disorder F19.90   . Homeless Z59.00   . Asthma during pregnancy O99.519, J45.909   . Trichomonas infection A59.9   . Staph aureus infection A49.01   . Rash of hands R21   . Posttraumatic stress disorder with dissociative symptoms F43.10            Past Surgical History:   Procedure Laterality Date   . CESAREAN SECTION, LOW TRANSVERSE     . Left leg surgery     . TONSILLECTOMY AND ADENOIDECTOMY       Family History   Problem Relation Age of Onset   . Substance abuse Mother           Social History     Tobacco Use   . Smoking status: Never   . Smokeless  tobacco: Former     Types: Snuff, Chew   Substance Use Topics   . Alcohol use: Yes     Comment: No additional insight provided   . Drug use: Yes     Types: Cocaine, Methamphetamines, IV, Heroin, Marijuana     Comment: "Yes, Cocaine, marijuanna, meth, heroin" - No addtional insight provided     Living Situation     Questions Responses    Patient lives with Other(comment)    Comment: "the streets"     Homeless Yes    Caregiver for other family member No    External Services None    Comment: "I would like to go to rehab"     Employment Unemployed    Domestic Violence Risk No                Review of Systems   Review of Systems   Constitutional: Negative for appetite change, chills, diaphoresis, fatigue, fever and unexpected weight change.   HENT: Negative for hearing  loss.    Eyes: Negative for visual disturbance.   Respiratory: Negative for shortness of breath.    Cardiovascular: Negative for chest pain, palpitations and leg swelling.   Gastrointestinal: Positive for abdominal pain. Negative for blood in stool, constipation, diarrhea, nausea and vomiting.   Endocrine: Negative for polyuria.   Genitourinary: Negative for dysuria.   Skin: Negative for color change.   Neurological: Negative for dizziness and headaches.   Psychiatric/Behavioral: Positive for suicidal ideas. Negative for confusion.       Physical Exam     Triage Vitals  Triage Start: Start, (01/21/22 1610)  First Recorded BP: 119/71, Resp: 16, Temp: 37.3 C (99.1 F), Temp src: Infrared Oxygen Therapy SpO2: 100 %, Oximetry Source: Lt Hand, O2 Device: None (Room air), Heart Rate: 94, (01/21/22 0953) Heart Rate (via Pulse Ox): 88, (01/21/22 1705).      Physical Exam  Vitals and nursing note reviewed.   Constitutional:       General: She is not in acute distress.     Appearance: Normal appearance. She is not ill-appearing.   HENT:      Head: Normocephalic and atraumatic.      Right Ear: External ear normal.      Left Ear: External ear normal.      Nose: No  congestion.   Eyes:      General: No scleral icterus.     Extraocular Movements: Extraocular movements intact.      Conjunctiva/sclera: Conjunctivae normal.      Pupils: Pupils are equal, round, and reactive to light.   Cardiovascular:      Rate and Rhythm: Normal rate and regular rhythm.      Pulses: Normal pulses.      Heart sounds: Normal heart sounds.   Pulmonary:      Effort: Pulmonary effort is normal. No respiratory distress.      Breath sounds: Normal breath sounds. No wheezing.   Abdominal:      General: Bowel sounds are normal. There is distension.      Palpations: Abdomen is soft.      Tenderness: There is no abdominal tenderness.      Comments: Gravid uterus.   Musculoskeletal:         General: No swelling or tenderness. Normal range of motion.      Cervical back: Neck supple. No tenderness.   Skin:     General: Skin is warm and dry.      Capillary Refill: Capillary refill takes less than 2 seconds.      Coloration: Skin is not jaundiced.   Neurological:      General: No focal deficit present.      Mental Status: She is alert and oriented to person, place, and time.   Psychiatric:         Mood and Affect: Mood normal.         Behavior: Behavior normal.         Medical Decision Making   Patient seen by me on:  01/21/2022    Assessment:  Alexis Vang is a 31 y.o. female [redacted] week pregnant w/ hx anxiety, asthma, depression, PTSD  pregnant presents with abdominal pain.  On exam patient's vitals are reassuring.  Given patient reported using multiple drugs prior to arrival will get EKG evaluate for signs of arrhythmia.  Will get coingestions and BMP evaluate for electrolyte abnormalities versus coingestions.  Given reports of abdominal pain patient is [redacted] weeks pregnant we will consult OB for  NST and Rex for further work-up.  Given patient reports SI will need to be evaluated by psych.     Differential diagnosis:  SI, intoxication, arrhythmia, electrolyte abnormalities, pregnancy     Plan:  Orders Placed This  Encounter      CBC and differential      Basic metabolic panel      Acetaminophen level      Salicylate level      Ethanol      RUQ panel (ED only)      Drug screen chemical dependency, urine      EKG 12 lead (initial)      Fetal nonstress test      ED transfer to [facility]      Review of existing & external labs / records: Lab/records    Consideration of hospitalization: Patient with reassuring NST abdominal pain improving.  Up to [facility] OB to coordinate with [facility] for further pelvic exam/work-up once it is deemed patient is able to consent properly.    ED Course and Disposition:  Sent up to [facility]         ED Course as of 01/21/22 1732   Tue Jan 21, 2022   1040 OB paged, [facility] to see if they can come down and evaluate pt here.   1041 OB aware to come see pt.    1236 Salicylate(!): <3.0   1236 Acetaminophen: <5   1236 Ethanol: <10   1236 WBC: 9.7   1236 Hemoglobin(!): 9.9   1236 Hematocrit(!): 30   1426 EKG reassuring.   1530 Cleared by OB, call up to [facility].       Ulyses Jarred, MD      Resident Attestation:    Patient seen by me on 01/21/2022.    I saw and evaluated the patient. I agree with the resident's/fellow's findings and plan of care as documented above. Details of my evaluation are as follows:    Additional attestation comments:  Presentation concerning for suicidal ideation, overdose, polysubstance abuse.  The patient endorsed overdose however could not describe which substance she overdosed on.  Her medical evaluation was reassuring.  OB was consulted given her current 33-week pregnancy and has cleared the patient for outpatient evaluation and psychiatry evaluation.  The patient will be transferred to [facility] for psychiatric evaluation.  She can continue to have OB evaluation as an outpatient..      Author:  Marylou Mccoy, MD          Ulyses Jarred, MD  Resident  01/21/22 Jamie Kato, MD  01/22/22 [phone]

## 2022-01-21 NOTE — CPEP Notes (Signed)
Clinical Evaluator Collateral Note    Professional:       [facility]  Phone: [phone]  9:44 PM- Writer Contacted [facility] and left a message requesting a call back with help placing pt at [facility].                 Personal:                Marchelle Gearing, LMSW, 9:41 PM

## 2022-01-21 NOTE — ED Notes (Signed)
Report given to CPEP RN

## 2022-01-21 NOTE — ED Triage Notes (Signed)
Pt homeless. Is currently 33 weeks and 5 days pregnant, confirmed. OB asking to be cleared as pt is SI, no HI. Does endorse doing drugs including heroin, crack, cocaine, and meth, as well as ETOH.               Prehospital medications given: No

## 2022-01-21 NOTE — CPEP Notes (Signed)
CPEP Triage Note    Arrival    Patient is oriented to unit and CPEP evaluation process: Yes  Reviewed cell phone and visitor policies: yes   Reviewed contraband items/milieu safety concerns and confirmed no safety concerns present: yes    Patient is accompanied by: patient alone  Patient under MHT: Yes    History and Chief Complaint    Reason for current presentation: [name] is a 31 yr old [redacted] week pregnant female presenting via MHT from [facility] for endorsing SI without plan. Only semi cooperative with triage and did not want to go into a room with Clinical research associate. Reports stressors as being homeless and health decline with drug use during her pregnancy. Denies HI/AVH/self harm/any attempts. Medical history includes gestational diabetes, HTN (preeclampsia), anxiety, depression, SUD, and history of scabies. Efrat's hands appear to be dry and have burns marks on the back of her fingers. No current medications but would like to start prenatal. Reports ETOH use; no additional insight provided. Reports Cocaine, marijuanna, meth, and heroin use. - No addtional insight provided. Homeless, Would like linkage to rehab. CPEP process explained.     Current Mental Health Provider(s): None - would like linkage to rehab     Any recent exposure to infestations(lice, scabies, bedbugs, fleas) or other communicable diseases: No    Substance use: drugs and alcohol     Ingestion: See above note     Self-harm: no    Medication Data Collection    Medication data collection: Yes: With Patient (Reliable)    Physical Assessment    Pain assessment: Last Nursing documented pain:  0-10 Scale: 0 (01/21/22 1600)    Last Filed Vitals    01/21/22 1550   BP: 102/63   Pulse: 84   Resp: 16   Temp: 36.1 C (97 F)   SpO2: 99%       Medical / Surgical History    PMH:   Past Medical History:   Diagnosis Date    Anxiety     Asthma     Depression     Gestational diabetes     Preeclampsia     Scabies     Substance abuse        PSH:   Past Surgical History:    Procedure Laterality Date    CESAREAN SECTION, LOW TRANSVERSE      Left leg surgery      TONSILLECTOMY AND ADENOIDECTOMY         Review of Systems   Respiratory: Negative for cough.    Cardiovascular: Negative for leg swelling.   Psychiatric/Behavioral: Positive for confusion, sleep disturbance and suicidal ideas.       Any additional concerns: SUD during pregnancy     Anticipated track: 2A    Elder Love, RN, 4:42 PM

## 2022-01-21 NOTE — ED Notes (Addendum)
Obstetrics team bedside for nonstress test.

## 2022-01-21 NOTE — Procedures (Signed)
Fetal Non-Stress Test    Patient: Alexis Vang Age: 31 y.o.  Date of Birth: 1991-01-23  IRC:[id number]  eMRN: [id number]  GA: [redacted]w[redacted]d  Maternal  Heart Rate: 94 (01/21/22 0953)  BP: 119/71 (01/21/22 0953)    Reason For Exam: Suicide attempt  Education Done:  NST      Fetal non-stress test for singleton pregnancy  Pre Procedure Diagnosis: Encounter for supervision of normal pregnancy  Post Procedure Diagnosis: NST (non-stress test) reactive            NST Start Time:  01/21/2022 11:22 AM            Uterine Irritability: Yes            Contractions: none    NST Fetus A  Variability: moderate  FHR Baseline: 120  Decelerations: none   Accelerations: at least 15 BPM for 15 seconds  Stimulation: none  NST Interpretation: reactive    End Time:  01/21/2022 11:42 AM          Duration of test (min): 20  Location of NST Fetal Heart Tracing: Archived electronically  Reviewed with:  Todhunter        Test performed By: Vania Rea, RN  01/21/2022 11:58 AM

## 2022-01-21 NOTE — CPEP Notes (Signed)
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  Clinical Presentation  Clinical presentation (recent changes): no recent changes identified  Engagement  Engagement and Reliability During Current Visit  Patient report appears to be credible/consistent: Yes  Patient is actively engaged with team in assessment and planning: Yes    Risk Formulation:   Risk Status (relative to others in a stated population): Chronically elevated due to substance use, prior psychiatric diagnoses, medication noncompliance   Risk State (relative to self at baseline or selected time period): At baseline   Available Resources (internal and social strengths to support safety and treatment planning): Future oriented, motivated for treatment, able to safety plan, able to [place], resourceful, knowledgeable of resources   Foreseeable Changes (changes that could quickly increase risk state): Substance use, ongoing unstable housing    Disposition Decision Formulation   In my clinical opinion, based on the above documented information, assessments, and multidisciplinary consultation, at this time a psychiatric hospitalization or extended observation of Alexis Bochenek is not indicated, because she denies SI or HI and is at her psychiatric baseline. Based on evaluation, she is no longer at acutely elevated risk of harm to herself or others and would not benefit from inpatient  treatment.    Disposition Plan and Recommendations      - Patient is psychiatrically cleared for discharge  - Will restart Lexapro 59m and Prenatal vitamin (will need rx upon discharge)  - [facility]7 [facility] and Triamcinolone)  - SW to assist with safe discharge planning and outpatient appointments including OBGYN and MH  - STI testing ordered    Family, current providers and referral source were informed of disposition, as indicated in the Clinical Evaluator's notes.    Did this patient's condition require a mandatory 9.46 report to the [facility] no       TRosezella Florida MD     RRosezella Florida MD  Resident  01/21/22 [phone]

## 2022-01-21 NOTE — CPEP Notes (Signed)
DHS After Hours, [phone]  10:30 pm: DHS returned call. Reported DHS cannot place her in the [facility] because as of November, she receives SSI, unless she or her rep payee can prove via receipts how her SI has been spent.
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
## 2022-01-22 LAB — CHLAMYDIA NAAT (PCR): Chlamydia Plasmid DNA Amplification: 0

## 2022-01-22 LAB — N. GONORRHOEAE NAAT (PCR): N. gonorrhoeae DNA Amplification: 0

## 2022-01-22 NOTE — Progress Notes (Signed)
Application for [facility]idential [facility]     Applicant Information    Last Name:Shockley      First Name: [name]   Middle Initial:     TA Application Submitted? No   ARES Request Done? No  Medicaid Managed Care Application Submitted? No    Gender: Female - Not Transgender   Have you ever been in the [employer]? No  Date of Birth: [birth year]              SSN: *Will be provided via request over the phone    Phone number that you can be reached now: [phone]  Phone number that you can be reached after discharge (if Inpatient): no phone   May We Leave a Message: Yes    1. Please select your housing situation at the time of this application: Homeless    2. Do you inject non-prescribed drugs using a needle/syringe? Yes    3. For Woman: Are you pregnant at this time? Yes    4. Medical Problems: No multi-disciplinary problems found      5. Mental Health (past 6 months): Suicidal Ideations?  Yes            Homicidal Ideations? No    6. Current Legal Involvement? No    Current Services Provider information   Please provide the information below of the service(s) your presently receive    No services     Please Attach the Following or Have Your Most Current Provider Send this Information    1. Most recent psychosocial/evaluation for substance use and mental health disorders with DSM diagnoses: Attached? Yes    2. Most recent history and physical: Attached? Yes    3. Most recent laboratory results including complete blood count and differentials, routine, and microscopic urinalysis, urine screen for drugs: Attached? Yes    4. Most recent TB (Tuberculosis) screening (PPD or Chest X-Ray): Attached? No    5. Consent for Release of Information between current Service Providers and residential Providers: Attached? Yes    6. Copy of the LOCADTR indicating residential level of care needed for accurate Waiting List placement and ARES approval: Attached? No    *PLEASE NOTE - The referring outpatient/inpatient therapist  must make the request for residential services in ARES when the person is pending/recieving DHS temporary assistance*  **If you have not had a history and physical, the required lab work, and/or TB screening done within the past 12 months, please schedule them immediately. **    Please Answer Yes or No The Following Statements    1. I need services for my substance use disorder: Yes  2. I believe that I am free of any communicable (infectionious) disease that can be spread by ordinary contact: Yes  3. I believe that I need acute hospital care right now: No  4. I have thoughts of hurting others or myself at this time: No  5. I am experiencing serious withdrawals symptoms at this time: No  I have experiences withdrawal seizures or "DTS" in the past: No    Rent/Payment     Wages/Other Income:   Please provide monthly income including a pay stub.  Monthly Income: $900  Please list sources of income: ssi     *If you do not have any wages/SSI/SSD or other income, please apply for TA/cash assistance immediately.     DHS Funding-Temporary Assistance/Medicaid  I applied for full cash assistance on:   DHS Case #: BA  Medicaid #: [id number]  Status of DHS case:   Phone #:     *If you are not approved for [facility] cash assistance you will remain responsible for the rent.     SSI/SSD  Please indicate the type of Social Security you are receiving: SSI  Please provide monthly SSI/SSD income.  Monthly SSI/SSD income: $58  Do you have a Rep Payee? No    Description of Residential Services for Which You are Applying    Stabilization (Intensive residential): I would benefit from 24-hour supervised setting to successfully maintain abstinence, participate in treatment, and achieve lasting recovery in a more independent setting. Services provided within the facility include medical and psychiatric care, nursing services, vocational services, and clinical groups.     Rehabilitation (Intensive Residential): I am not experiencing  cravings or withdrawal symptoms. If applicable my physical and mental health are stable. Services provided include support with emotional regulation, interpersonal skills, and social role functioning.     Community Re-Integration [facility]): I am living in an environment not conductive to recovery and need a 24-hour supervised setting and/or other support services such as engagement in outpatient treatment services, vocational services, medication management, and structured environment.     Community Re-Integration (Supportive Living): I require residential support that provides a substance free environment; I require peer support to maintain abstinence; I don't require 24-hour-on-site supervision; I exhibit the skills to maintain abstinence and readapt to independent living as evidenced by engagement in recovery related services including outpatient continuing care, attendance at self-help or community related supports, engagement in volunteer and work related activities.     When referring to a residential setting please consider the following placement questions:     What Level of care does the LOCADTR 3.0 indicate? Stabilization (Intensive Residential)    Does the person have a serious psychiatric or medical symptoms that would require 24-hour psychiatric or medical oversight? Id the person using Substances with risk of medical complications from withdrawal or risk of Overdose?  No    Does the person have the ability to tolerate group living? Does the person have an interpersonal or personal skills deficits that would cause disruption or harm in a congregate setting? Does the individual have a history of physical violence. Aggression, or exhibit predatory behaviors?  No    When referring to a residential setting please refer to level of Care indicated on LOCADTR.     Location's to Send Referral    [facility]     If being Completed with the assistance of another individual, please complete     Name of  Agency person Assisting with application: Dorene Grebe  Agency: [facility] [phone]  Phone:   Date: 01/22/2022      Name: _______________________________________   Date: _____________    Physical Signature of Applicant (Person seeking residential services):

## 2022-01-22 NOTE — CPEP Notes (Signed)
Alexis Vang has been resting for the majority of the shift and able to make her needs known. She voiced she is scared to leave CPEP and one on one verbal support provided. Food and beverage provided. Alexis Vang offers no physical complaints at this time. Q 15 min safety checks maintained.

## 2022-01-22 NOTE — CPEP Notes (Signed)
Writer met with [name] for discharge planning. Writer informed pt that Alexis Vang does not have beds available. Writer explained that because she receives SSI, she can not be placed in emergency housing by [facility]. Writer provided options to cab to shelter or DSS. Pt stated "I really need to go rehab". Pt stated that she cannot stop using in her current situation. Writer does not have a phone to follow up on rehab referrals.     Writer contacted [facility] for consult.

## 2022-01-22 NOTE — CPEP Notes (Signed)
Writer  Spoke  with patient about her substance use. Patient stated that she uses crack and heroin daily. She smokes about a gram a day. Writer discussed treatment options and patient is very motivated to get into impatient treatment. Patient stated that She is going to need housing after treatment. Writer explained the 820 program and patient agreed that it would be helpful.

## 2022-01-22 NOTE — CPEP Notes (Signed)
Pt has been calm and cooperative with staff all shift. She accepted ABT as ordered and was assisted with triamcinolone application. She denied any SI/HI/AH/VH. Pt was able to let their needs be known. Pt appeared to be sleeping throughout the shift with no acute distress noted. Q15 safety checks and comfort measures maintained. Will continue to monitor.

## 2022-01-23 ENCOUNTER — Observation Stay

## 2022-01-23 DIAGNOSIS — O99342 Other mental disorders complicating pregnancy, second trimester: Secondary | ICD-10-CM

## 2022-01-23 DIAGNOSIS — Z8279 Family history of other congenital malformations, deformations and chromosomal abnormalities: Secondary | ICD-10-CM

## 2022-01-23 DIAGNOSIS — R519 Headache, unspecified: Secondary | ICD-10-CM

## 2022-01-23 DIAGNOSIS — T7421XA Adult sexual abuse, confirmed, initial encounter: Secondary | ICD-10-CM

## 2022-01-23 DIAGNOSIS — O355XX0 Maternal care for (suspected) damage to fetus by drugs, not applicable or unspecified: Secondary | ICD-10-CM

## 2022-01-23 DIAGNOSIS — F1990 Other psychoactive substance use, unspecified, uncomplicated: Secondary | ICD-10-CM

## 2022-01-23 DIAGNOSIS — O368130 Decreased fetal movements, third trimester, not applicable or unspecified: Secondary | ICD-10-CM

## 2022-01-23 DIAGNOSIS — O34219 Maternal care for unspecified type scar from previous cesarean delivery: Secondary | ICD-10-CM

## 2022-01-23 DIAGNOSIS — O99322 Drug use complicating pregnancy, second trimester: Secondary | ICD-10-CM

## 2022-01-23 DIAGNOSIS — F99 Mental disorder, not otherwise specified: Secondary | ICD-10-CM

## 2022-01-23 LAB — URINALYSIS REFLEX TO CULTURE
Blood,UA: NEGATIVE
Glucose,UA: NEGATIVE
Leuk Esterase,UA: NEGATIVE
Nitrite,UA: NEGATIVE
Specific Gravity,UA: 1.024 (ref 1.002–1.030)
pH,UA: 7.5 (ref 5.0–8.0)

## 2022-01-23 LAB — URINE MICROSCOPIC (IQ200)
Bacteria,UA: NONE SEEN
Hyaline Casts,UA: NONE SEEN /lpf (ref 0–5)

## 2022-01-23 LAB — COCAINE, URINE, CONFIRMATION: Confirm COC/METAB: POSITIVE

## 2022-01-23 LAB — THC SEMI-QUANT, URINE
Creatinine,UR: 233 mg/dL (ref 20–300)
Semi-Quant THC,UR: 13 ng/mL
THC/Creat Ratio: 6 ng/mg

## 2022-01-23 MED ORDER — AZITHROMYCIN 250 MG PO TABS *I*
1000.0000 mg | ORAL_TABLET | Freq: Once | ORAL | Status: AC
Start: 2022-01-23 — End: 2022-01-23
  Administered 2022-01-23: 1000 mg via ORAL
  Filled 2022-01-23: qty 4

## 2022-01-23 MED ORDER — CEFTRIAXONE SODIUM 1 G IN STERILE WATER 10ML SYRINGE *I*
1000.0000 mg | Freq: Once | INTRAVENOUS | Status: AC
Start: 2022-01-23 — End: 2022-01-23
  Administered 2022-01-23: 1000 mg via INTRAVENOUS
  Filled 2022-01-23: qty 1000

## 2022-01-23 MED ORDER — ACETAMINOPHEN 500 MG PO TABS *I*
1000.0000 mg | ORAL_TABLET | Freq: Once | ORAL | Status: AC
Start: 2022-01-23 — End: 2022-01-23
  Administered 2022-01-23: 1000 mg via ORAL
  Filled 2022-01-23: qty 2

## 2022-01-23 MED ORDER — METRONIDAZOLE 500 MG PO TABS *I*
500.0000 mg | ORAL_TABLET | Freq: Two times a day (BID) | ORAL | Status: DC
Start: 2022-01-23 — End: 2022-01-25
  Administered 2022-01-23 – 2022-01-25 (×3): 500 mg via ORAL
  Filled 2022-01-23 (×6): qty 1
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
## 2022-01-23 NOTE — CPEP Notes (Signed)
Writer contacted OB charge/triage d/t pt concern for decreased fetal movement along with abdominal cramping and headaches. Plan for pt to transfer to [facility] triage for NST.    Pt is psychiatrically cleared for discharge, but boarding in CPEP for discharge planning. If requires medical admission, please update CPEP although no further psychiatric evaluation/clearance needed.     Serafina Royals, NP  01/23/22 [phone]

## 2022-01-23 NOTE — CPEP Notes (Signed)
Ronald has been pleasant upon approach and resting calm; offering no physical complaints. Foods and fluids provided; Q 15 min safety checks maintained.

## 2022-01-23 NOTE — Procedures (Signed)
Fetal Non-Stress Test    Patient: [name] Hofferber Age: 31 y.o.  Date of Birth: Mar 02, 1991  EXM:[id number]  eMRN: [id number]  GA: [redacted]w[redacted]d  Maternal  Heart Rate: 71 (01/23/22 0940)  BP: 113/64 (01/23/22 0940)    Reason For Exam:  Education Done:  External fetal monitoring      Fetal non-stress test for singleton pregnancy  Pre Procedure Diagnosis: Decreased fetal movement  Post Procedure Diagnosis:            NST Start Time:  01/23/2022 9:35 AM            Uterine Irritability: No            Contractions: none    NST Fetus A  Variability: moderate  FHR Baseline: 120  Decelerations: none   Accelerations: at least 15 BPM for 15 seconds  Stimulation: none  NST Interpretation: reactive    End Time:  01/23/2022 9:55 AM          Duration of test (min): 20  Location of NST Fetal Heart Tracing: Archived electronically  Reviewed with:  Rich Reining, MFM        Test performed By: Levie Heritage, RN  01/23/2022 10:01 AM

## 2022-01-23 NOTE — CPEP Notes (Signed)
Writer spoke briefly with Gemma Payor CASAC. Patient to have phone screen with [facility] on 1/27 at 10a.     Shelda Jakes, NP  01/23/22 [phone]
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
## 2022-01-23 NOTE — Progress Notes (Incomplete)
Writer spoke with Social Work on [employer]. The patient will be returning to the unit. [facility] was informed of her being on the list for admission. [name] stated to them that

## 2022-01-23 NOTE — CPEP Notes (Signed)
Writer spoke with OB resident Vijayakumar MD about updated plan of care. Patient was screen for STI's; prophylactic Flagyl 500mg  BID started for 7 days. Ultrasound to be obtained; if no abnormalities or concerns are present she will be transferred back to CPEP. Writer asked that CPEP charge RN be contacted when patient is cleared for transfer back.      , NP  01/23/22 1702

## 2022-01-23 NOTE — Progress Notes (Addendum)
Social Work Note  Spoke with CPEP NP Harlon Flor [phone] who stated that Alexis Vang should return to CPEP after she is cleared from OB.  Pt has been psychiatrically cleared for discharge but is boarding in CPEP for d/c planning. Alexis Vang noted that their staff is working on a discharge plan for pt and expect that she will be sent to [facility] later today.  Triage staff updated.    Alexis Vang, [place]  [id number] [phone]    Addendum:  While pt has been in Triage, SW has been in communication with Alexis Vang at [facility] [phone].  Alexis Vang has spoken with Alexis Vang and requested that she sign an agreement regarding what to expect at [facility].  SW read the agreement letter to Alexis Vang and faxed it back to Alexis Vang [phone]).    Pt has been sent to [facility] twice from [facility] (under the name Alexis Vang)- once in August where she left the same day and once in October where she stayed one night prior to leaving.  Alexis Vang insists that she has never been to [facility] and that her twin sister Alexis Vang had been the one at this facility.    Per f/u with CPEP NP Alexis Vang, Alexis Vang will be the person coordinating pt's d/c once she returns to CPEP.  SW spoke to Alexis Vang [phone]) who noted that she will follow up with Alexis Vang.  Alexis Vang, [place]  [id number] [phone]

## 2022-01-24 DIAGNOSIS — R45851 Suicidal ideations: Secondary | ICD-10-CM

## 2022-01-24 DIAGNOSIS — Z91410 Personal history of adult physical and sexual abuse: Secondary | ICD-10-CM

## 2022-01-24 DIAGNOSIS — O4703 False labor before 37 completed weeks of gestation, third trimester: Secondary | ICD-10-CM

## 2022-01-24 DIAGNOSIS — O479 False labor, unspecified: Secondary | ICD-10-CM

## 2022-01-24 LAB — HEPATITIS B & C PROF
HBV Core Ab: NEGATIVE
HBV S Ab Quant: 1.17 m[IU]/mL
HBV S Ab: NEGATIVE
HBV S Ag: NEGATIVE
Hep C Ab: NEGATIVE

## 2022-01-24 LAB — CHLAMYDIA NAAT (PCR): Chlamydia Plasmid DNA Amplification: 0

## 2022-01-24 LAB — CONFIRM THC METABOLITE, URINE: Confirm THC Metab: POSITIVE

## 2022-01-24 LAB — SYPHILIS SCREEN
Syphilis Screen: NEGATIVE
Syphilis Status: NONREACTIVE

## 2022-01-24 LAB — VAGINITIS SCREEN: DNA PROBE: Vaginitis Screen:DNA Probe: 0

## 2022-01-24 LAB — N. GONORRHOEAE NAAT (PCR): N. gonorrhoeae DNA Amplification: 0

## 2022-01-24 LAB — HIV-1/2  ANTIGEN/ANTIBODY SCREEN WITH CONFIRMATION: HIV 1&2 ANTIGEN/ANTIBODY: NONREACTIVE

## 2022-01-24 MED ORDER — ACETAMINOPHEN 500 MG PO TABS *I*
1000.0000 mg | ORAL_TABLET | Freq: Once | ORAL | Status: AC
Start: 2022-01-24 — End: 2022-01-24
  Administered 2022-01-24: 1000 mg via ORAL
  Filled 2022-01-24: qty 2

## 2022-01-24 MED ORDER — SODIUM CHLORIDE 0.9 % FLUSH FOR PUMPS *I*
0.0000 mL/h | INTRAVENOUS | Status: DC | PRN
Start: 2022-01-24 — End: 2022-01-25

## 2022-01-24 MED ORDER — DIPHENHYDRAMINE-LIDOCAINE-MAALOX (BMX/FIRST MOUTHWASH) *WRAPPED*
30.0000 mL | Freq: Once | ORAL | Status: DC
Start: 2022-01-24 — End: 2022-01-25
  Filled 2022-01-24: qty 30

## 2022-01-24 MED ORDER — DEXTROSE 5 % FLUSH FOR PUMPS *I*
0.0000 mL/h | INTRAVENOUS | Status: DC | PRN
Start: 2022-01-24 — End: 2022-01-25

## 2022-01-24 MED ORDER — BETAMETHASONE ACET & SOD PHOS 6 (3-3) MG/ML IJ SUSP *I*
12.0000 mg | INTRAMUSCULAR | Status: DC
Start: 2022-01-24 — End: 2022-01-24

## 2022-01-24 NOTE — CPEP Notes (Signed)
Pt continues to rest in room G. Made phone call, took AM meds, eating and drinking. Able to verbalize needs. Pleasant upon approach. Maintaining behavorial control. Will continue with q 15 min safety checks

## 2022-01-24 NOTE — Progress Notes (Signed)
RN hand off to [employer].

## 2022-01-24 NOTE — CPEP Notes (Signed)
Clinical Evaluator Note:    Aware patient is psychiatrically cleared for discharge pending safe discharge plan as she is 34w pregnant (also cleared by OB who remains involved). Patient has phone screen scheduled for 10AM with [facility] (call [place] [phone]). Discussed with Berniece Andreas, BSW who will plan to assist with phone screen. Writer will continue to follow and provide update when available.    Shanda Bumps Pete Schnitzer, LMSW  CPEP [phone]

## 2022-01-24 NOTE — CPEP Notes (Signed)
Writer just informed patient that she has a phone screen at 10:00 am. Patient was very engaged appropriately.

## 2022-01-24 NOTE — Progress Notes (Signed)
Pain improved with tylenol.  Pt continues to decline pelvic exam, feels reassured by fetal monitoring and review of yesterdays blood work results.  She would like to return to CPEP with hot packs for comfort.  [place] notified.

## 2022-01-24 NOTE — CPEP Notes (Signed)
[name] has been pleasant upon approach and resting calm; offering no physical complaints. Foods and fluids provided; Q 15 min safety checks maintained.

## 2022-01-24 NOTE — CPEP Notes (Signed)
Pt reported to her assigned nurse that she is having contractions that are 2-5 minutes apart. She reports no other pain or physical changes. She does appear to be in distress.   OB charge RN contacted

## 2022-01-24 NOTE — CPEP Notes (Signed)
Sat in with [name] while she had a screening phone interview with [employer].
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
## 2022-01-24 NOTE — Progress Notes (Signed)
CPEP notified that pt ok for return.  Once MDs have hand off, ok to submit for transportation.

## 2022-01-25 ENCOUNTER — Emergency Department
Admission: EM | Admit: 2022-01-25 | Discharge: 2022-01-26 | Disposition: A | Discharge status: Home / Self Care | Payer: Medicaid Other | Source: Ambulatory Visit | Attending: Psychiatry | Admitting: Psychiatry

## 2022-01-25 DIAGNOSIS — F4310 Post-traumatic stress disorder, unspecified: Secondary | ICD-10-CM | POA: Insufficient documentation

## 2022-01-25 DIAGNOSIS — O99343 Other mental disorders complicating pregnancy, third trimester: Secondary | ICD-10-CM | POA: Insufficient documentation

## 2022-01-25 DIAGNOSIS — Z3A34 34 weeks gestation of pregnancy: Secondary | ICD-10-CM | POA: Insufficient documentation

## 2022-01-25 DIAGNOSIS — Z59 Unhousedness unspecified: Secondary | ICD-10-CM | POA: Insufficient documentation

## 2022-01-25 MED ORDER — PRENATAL PLUS IRON 29-1 MG PO TABS *I*
1.0000 | ORAL_TABLET | Freq: Every day | ORAL | Status: DC
Start: 2022-01-26 — End: 2022-01-26
  Administered 2022-01-26: 1 via ORAL
  Filled 2022-01-25: qty 1

## 2022-01-25 MED ORDER — ESCITALOPRAM OXALATE 10 MG PO TABS *I*
20.0000 mg | ORAL_TABLET | Freq: Every day | ORAL | Status: DC
Start: 2022-01-26 — End: 2022-01-26
  Administered 2022-01-26: 20 mg via ORAL
  Filled 2022-01-25: qty 2

## 2022-01-25 NOTE — CPEP Notes (Signed)
CPEP Triage Note    Arrival    Patient is oriented to unit and CPEP evaluation process: Yes  Reviewed cell phone and visitor policies: yes   Reviewed contraband items/milieu safety concerns and confirmed no safety concerns present: yes    Patient is accompanied by: patient alone  Patient under MHT: No    History and Chief Complaint    Reason for current presentation: Pt is a 31 year old female presenting voluntarily 2 hours after discharge. Prior dx of substance use disorder, unspecified psychosis, and homelessness. Reported in ED triage + SI; denies attempt. Denies current SI/plan/intent.Reports "it was a waste of my time I should have just stayed here" (in regards of [facility]) Does not appear at this time pt is experiencing/reporting any psychiatric concerns. + confirmed pregnancy     Current Mental Health Provider(s): None reported    Any recent exposure to infestations(lice, scabies, bedbugs, fleas) or other communicable diseases: No    Substance use: hx of use unknown last use     Ingestion: Denies    Self-harm: no    Medication Data Collection    Medication data collection: Yes: Other: mar    Physical Assessment    Pain assessment: Last Nursing documented pain:  0-10 Scale: 0 (01/25/22 1637)    Last Filed Vitals    01/25/22 1637   BP: 114/66   Pulse: 94   Resp: 16   Temp: 36.2 C (97.2 F)   SpO2: 99%       Medical / Surgical History    PMH:   Past Medical History:   Diagnosis Date    Anxiety     Asthma     Depression     Gestational diabetes     Preeclampsia     Scabies     Substance abuse        PSH:   Past Surgical History:   Procedure Laterality Date    CESAREAN SECTION, LOW TRANSVERSE      Left leg surgery      TONSILLECTOMY AND ADENOIDECTOMY         Review of Systems   Constitutional: Negative.    HENT: Negative.    Eyes: Negative.    Respiratory: Negative for shortness of breath.    Cardiovascular: Negative.    Gastrointestinal: Negative for abdominal pain.   Endocrine:  Negative.    Genitourinary: Negative.    Musculoskeletal: Negative.    Skin: Negative.    Allergic/Immunologic: Negative.    Neurological: Negative.    Hematological: Negative.    Psychiatric/Behavioral: Negative for hallucinations and suicidal ideas. The patient is nervous/anxious.        Any additional concerns: prior OB consults required during previous CPEP presentation     Anticipated track: 1D    Jacelyn Pi, RN, 7:14 PM

## 2022-01-25 NOTE — CPEP Notes (Signed)
Collateral Note:    Pt is working on getting into [facility] (call [facility] [phone] x [phone]), she had an intake appointment on 1/27.  [facility] to follow up with intake status, no weekend coverage is available.      Spoke with [facility] [phone]), they are only accepting people through DSS.  Left a message and requested a call back from after hours ([phone]). Spoke with DSS and the patient gave her January money to her mother, so they suggested she stay with her or go to a shelter.  They are unable to place her at this time.       Spoke with [facility] [phone]) and they have beds available. Pt will d/c with cab to [facility].       Micael Hampshire, LCSW

## 2022-01-25 NOTE — ED Triage Notes (Signed)
Voluntary. Discharged 2 hours ago and picked up from [facility] stating that they will not let her stay there because she is pregnant and has a history of suicidal ideation. + SI currently. Denies drugs, etoh. No medical complaints related to pregnancy.               Prehospital medications given: No

## 2022-01-25 NOTE — CPEP Notes (Addendum)
Clinical Evaluator Collateral Note      Spoke with [facility] staff reported she was suicidal when she got to there and they do not accept people with current S/I or a history of S/I.  Pt is not able to return due to S/I history.     Spoke with [facility] and they are unable to accommodate pregnant women at this time.     Plan is continue to follow up with [facility] to follow up on intake appointment done on 01/24/22.    Micael Hampshire, LCSW, 5:22 PM
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
## 2022-01-25 NOTE — Progress Notes (Signed)
Pt transferred to CPEP with hospital staff via wheelchair.

## 2022-01-25 NOTE — Discharge Instructions (Addendum)
CPEP Discharge Instructions    Discharge Date: 01/25/2022    Discharge Time:   3:15pm    Follow-ups:  You [facility] for a phone intake appointment.  If you would like to continue with the process, follow up with them on Monday. Sunday Cornmily Price [phone]      The clinical team recommends the following actions for creating a safe environment post discharge utilize options as identified in completed safety plan , reach out to family and/or friends for support , and remove any weapons or firearms from home    Level of outreach indicated if patient fails new intake or COPS (Comprehensive Outpatient Psychiatric Service) appointment: Other Follow up with [facility].     When to call for help:    Call your psychiatric outpatient provider if experiencing any of these symptoms: increased irritability, sleep changes, appetite changes, energy changes, thoughts to harm yourself or others, anxiety, fear, auditory or visual hallucinations.  Lifeline Helpline (24 hours/7 days) [phone] Consulting civil engineer(TTY)  Mobile Crisis team: [phone]    General Instructions:  Other written information given to the patient: No  Return to Work/School on: n/a      The above information has been discussed with me and I have received a copy.  I understand that I am advised to follow the instructions given to me to appropriately care for my condition.    Safety Plan:   Safety Plan Intervention  Discussed Safety Plan with Patient: Yes  Completed Safety Plan with Patient: Yes      [facility] Safety Plan      Step 1: Warning signs (thoughts, images, feelings, behaviors) that a crisis may be developing:  Not taking care of self  Pacing  Racing heartbeat  feeling afraid     Step 2: Internal coping strategies - Things I can do to take my mind off my problems without contacting another person (distracting & calming activities):  being in a quiet place/taking space, coloring/drawing, listening to music and  dancing, cooking, writing poetry, relaxing        Step 3: People and social settings that provide distraction:  Name: "no one"  Place: the hospital      Place: rehab     Step 4: People whom I can ask for help:  Other (e.g. case Production designer, theatre/television/filmmanager, neighbors, people from church): "the hospital"     Step 5: Professionals or agencies I can contact during a crisis:              Local Urgent Care or Emergency Room        [facility] CRISIS CALL [facility]ENTER: [phone]              [facility]: [phone]              NATIONAL SUICIDE PREVENTION [place]: 988              POLICE: 911     Step 6: Making the environment safe (removing or limiting access to lethal means):  Using safety plan and community resources as needed, staying in a safe environment, taking medications, following up with medical appointments     The one thing that is most important to me and worth living for is: "my baby"     [facility] Mobile Crisis Team  Call 211  [phone]  [phone] (TTY)  [phone]  Crisis Line: [phone]  Mission  As an  integral part of [facility] and the [facility], the [facility] Crisis Team is committed to providing responsive mental health treatment to individuals and families experiencing a mental health crisis.    The Team  The Team is a Nature conservation officer / multi-disciplinary group of mental health providers. It operates under the belief that mental health crisis services are most effective when provided rapidly and in a familiar environment. To ensure as much confidentiality as possible, the mobile vehicles are unmarked. Team employees have photo identification readily available.    The Mobile Crises Team is a mobile psychiatric emergency team for individuals and families within [place] who are experiencing a crisis in mental health. Its services are designed to divert individuals who are not at risk of harming themselves, as well as their  families, from the countys psychiatric emergency departments. A team of mental health professionals go to the individual or family in their home, place of employment, school, etc. Depending on the complexity of the problems, a visit can range from 1 to 3 hours. Follow-up visit are available if necessary. Services provided include:    Mental Health Crisis interventions to individuals, families, and those already connected with community agencies  Response to psychological distress  Crisis stabilization and referral  Mental health assessments  Collaborative treatment plan development  Linkage and/or re-linkage to treatment providers  Referral to non-mental health services  Respite housing (when available)  Critical incident stress management  Supportive interim/bridge therapy  Services for the homeless who are mentally ill and chemically addicted  Interpretation for non-English speaking individuals    Children/Adolescents  The [employer] Crisis Team is a part of the [facility] Emergency Services and is available to the children and adolescents who are experiencing emotional problems that lead to a crisis at home, school, or in the other segments of the community.    Referrals to the Mobile Team can be made by anyone who comes in contact with the child or adolescent in crisis. This includes family, friends, physicians, school personnel, mental health professionals, and other Production designer, theatre/television/film.    Parents or legal guardians must give permission for care unless the situation is an emergency In the event of an emergency, care will be initiated to keep the child safe pending family notification.    Adults  The team is available to adult and older adults experiencing a mental health crisis. Referrals can be made by family, friends, primary care physicians, public and private therapist, Patent examiner, and other Production designer, theatre/television/film.    Cost for [employer] Crisis visits are  covered by most insurance. Those who require mobile services, but have financial difficulties can call out Market researcher at [phone] or toll free at [phone].    No one will be refused services due to an inability to pay.      Hours of Operation  Monday through Friday, 8:30 AM to 10:00 PM  Weekends and Holidays, 10:00 AM to 6:30 PM  An on-call response is available for mental health emergency after hours.      Chemical Dependency Resources     How can I tell if I have an alcohol problem?    Alcohol may be harmful for you if it causes a problem in any part of your life. Following are signs that you may have a drinking problem or are alcohol dependent.      Blacking out or forgetting where you were or  what you were doing    Drinking to get drunk. Or, feeling like you need to drink more to get the same feeling or "buzz"    Drinking to decrease pain or stress    Drinking more than you had expected to drink    Drinking in a pattern. For example, every day or every week at the same time    Suffering from the effects of alcohol on your daily function    Drinking starts to take over and causes problems in your daily life, such as not showing up for work or driving when you are drunk    Trying to hide how much you drink   Feeling guilty or angry when someone says something about your drinking    Seeing or hearing things that are not there (hallucinating)    Having major personality changes when you drink    Planning activities around drinking    Experiencing seizures (convulsions)    Shaking of your hands if you have not had a drink for a while    Sleeping problems or bad dreams    Sweating, nervousness, confusion, or depression    Thinking a lot about drinking    Trouble having erections     What's the harm?    Not all drinking is harmful. You may have heard that regular light to moderate drinking (from  drink a day up to 1 drink a day for women and 2 for men) can even be good for  the heart. With at-risk or heavy drinking, however, any potential benefits are outweighed by greater risks.    Injuries. Drinking too much increases your chances of being injured or even killed.  Alcohol is a factor, for example, in about 60% of fatal burn injuries, drownings, and homicides; 50% of severe trauma injuries and sexual assaults; and 40% of fatal motor vehicle crashes, suicides, and fatal falls.    Health problems. Heavy drinkers have a greater risk of liver disease, heart disease, sleep disorders, depression, stroke, bleeding from the stomach, sexually transmitted infections from unsafe sex, and several types of cancer. They may also have problems managing diabetes, high blood pressure, and other conditions.    Birth defects. Drinking during pregnancy can cause brain damage and other serious problems in the baby. Because it is not yet known whether any amount of alcohol is safe for a developing baby, women who are pregnant or may become pregnant should not drink.    Alcohol use disorders. Generally known as alcoholism and alcohol abuse, alcohol use disorders are medical conditions that doctors can diagnose when a patient's drinking causes distress or harm. In the [place], about 18 million people have an alcohol use disorder.       Wellness Hints:    Be honest and open about your alcohol use with your family, close friends, and health care professionals. Ask for and accept their help.  Avoid persons who use and/or abuse alcohol and who try to get you to drink alcohol.   Get the help of a counselor and keep regular appointments.   Eat a normal, well-balanced diet, drink six to eight glasses of water a day, and get plenty of rest.   Replace social activities associated with drinking alcohol with those that do not involve alcohol.   Consider cutting down on the amount of alcohol you drink and how often you drink it.     OPEN ACCESS CENTER  [address], [place] [phone] OPEN 24  hours/ 7 days week (Spanish Speaking available)  (Se habla Espaol! Proveemos Asesoramiento y [employer]. Referidos para programas mdicos de Detoxificacin, internado (inpatient) y programas Ambulatorios (outpatient). )  [address], [place] [address] [phone] OPEN 7AM to 10PM/ 7 days week (closed for major holidays)  *both locations serve [place], [place], [place], [place], [place], [place], [place], [place] and [place]  Providing help with opioid, alcohol and other substances. Assessments, evaluations and referrals to detox, inpatient and/or outpatient services.    MOBIL TREATMENT OPTIONS:  [facility] Treatment Unit   Operated by [employer] of Alcoholism & Substance Abuse (GCASA)   *hours vary (p) [phone]   [employer] & [place] - [facility] Mobile 24/7 Opiod Response Team   [facility] (FLACRA) (p) [phone]     DETOX FACILITIES:  [place]  [address] [place] [address] ([phone]   [facility])  [address] [place] [address] (6th floor) [place] [address] [phone]   FLACRA [facility] Alcohol Crisis Center   [address] [place] [address]  *Medicaid Managed Care and Private Ins. Only  (p) [phone]   [facility], Alcohol Only)  [address] [place] [address] [phone]   [facility] - Outpatient Only)  [address] [place] [address]  *Walk in Hours M-F 8:30AM  (p) [phone]   Pathways [facility])  [address] [place] [address]  www.[email or web address] Sabrina Howland (intake)  (p) [phone]     [facility]   (Opiates only with Suboxone)  [address] [facility] [place] [address]  [email or web address] (p) [phone] or [phone]   [facility] Recovery  [address] [address]  www.[email or web address] (online referral available)   (p) [phone] (toll free)  (p) [phone]       INPATIENT PROGRAMS:  [facility]   [address] [place] [address] [phone]   CASA [address] -   [address] [place], [address] [place] [address] ([phone] or   [phone] Ext [phone]     [facility]   [address] [place] [address] ([phone]   [facility]   [address] [place] [address] [address] Spark [address]) (p) [phone]   [facility]  [address] [place] [address] [phone]   [facility]  [address] [place] [address] [phone] option 3   [facility]  [address] [place] [address] [phone] option 4   [facility]  [address] [place] [address] [phone]   [facility] - (2 Locations)   [facility]   [address] [address]  *Walk in Hours 7 days a week 9am-11am    [facility]   [address] [place] [address] (p) [phone] ext. [phone] (self-referral line)        (p) [phone]   [facility] [place])  [address] [place] [address] [phone]   [facility]  [address] [place] [address] (direct phone# [phone])  Application online: www.horizon-health.org (p) [phone]    [facility]  [address] [place] [address] [phone]   [place]  [address] [place]  [place] [address] (p) [phone]   [facility]  [address] [place] [address] [phone]   [facility] [place]   [address] [place] [address] (located on [facility]) (p) [phone]   [facility]  [address] [place] [address] ([phone]     [facility]   [address] [place] [address] [phone]   Pathways of [facility])  [address], [place]  [address]  www.[email or web address] [place]ie Hail (intake)  (p) [phone] ext [phone]     [facility]   [address] [place] [address] ([phone]   [facility]  [address] [place]. [address], [address]) [place] [address] [phone]   Holiday representative [facility])   [address] [place] [address] [phone] ext [phone] [facility]  [address] [place] [address] [phone]   [facility] (2 Locations)   [facility]   [address] [place] [address]    [facility])   [address] [place] [address] [phone]      [phone]   [facility]   [address] [place] [address]  [phone]   [facility] Inpatient Chemical Dependency Treatment Center   [address] [place] [address] [phone]   [facility] Treatment [facility] Recovery  [address] [place] [address]  www.[email or web address] (online referral available) (p) [phone] (toll free)  (p) [phone]   [facility] (No Medicaid/Medicare)   [address] [place] [address] [phone]     OUTPATIENT PROGRAMS:  [facility]  [address] [place] [address]  www.SocialListing.com.br  *Walk in Hours Monday & Wednesday 8:30am-Noon and T &TH 3pm-5pm at   [address] [phone]  Ext. [phone]   [facility] - Comprehensive Alcoholism  [address] [place] [address] [phone]   [place]  [address] [place] [address] [phone] Ext [address] (3  Clinics)   [address]  [place] [address]   [address] [place] [address]   [address]  [place] [address]  *Do not accept Medicare Ins. (p) [phone] ([place])  (p) [phone] ([place])  (p) [phone] [place])   [facility]  [address] [place] [address] [phone]   [facility] (Restart)  [address] [place] [address]  *Walk in hours Monday, Wednesday, Friday 1pm-2:30pm at N.[place]abas [place] ([place]   [facility]patient Chemical Dependency  [address] [place] [address] Dorothy Spark Building) (p) [phone]     [facility]  [address] [place] [address]  www.[email or web address] (p) [phone]   [facility] (Monday- Thursday)  [address] [place] [address]  www.delphirise.org  *Walk in hours Monday - Thursday 8:00am- 4:00pm (p) [phone] ext [phone]   [facility]   [address] [place]. [place] [address] ([phone]   [facility] on Alcohol & Substance Abuse (GCASA)  [address], [place], [place] [address]  [address] [place], [place]  Dorma Russell  [address] [place], [place] [address] ([phone] ([address]  ([phone] Jerolyn Shin)  (p) [phone] Veva Holes)   Huther Doyle  [address] [place] [address]  StubAgent.pl  *Walk in Hours Monday - Friday 8:30am, 10:00am, 12:30pm & 2:00pm (p) [phone]   Melissa Noon (Hispanic Outreach) - [facility]: Lambert Rhonda W Restart  [address] [place] [address] [phone]   [facility])  [address] [place] [address] [phone]   [facility]   [address] [place] 2 [place] [address] (direct phone [phone])  [address] [place] [address]  (direct phone [phone])  [address] [place] [address] Jenelle Mages) (direct phone [phone])  [address] [place] [address] [facility]) (direct phone [phone])  BuyingShow.es  *Walk in Hours Monday - Friday  8:00am - 3:00pm at all sites (p) [phone]  (indicate location when calling main intake line)   [facility] Pathways  [address] [place] [address]  www.[email or web address].org (p) [phone]   [facility]   [address] [place]  [address]  www.[email or web address]ace.edu  *Walk in Hours Monday - Wednesday 8:00am - 9:30am   *Phone Intake Monday - Friday 8:00am - 4:00pm (p) [phone]   (f) [phone]   [facility]  [address]) [place] [address]  www.westfallassociates.com (p) [phone]     RESIDENTIAL/SUPPORTIVE LIVING FACILITES:  [facility] (Restart)  [address] [place] [address] [phone] Ext [phone]     [facility] (CARS)   [address] [place] [address] ([phone]   [facility]   [address] [place] [address]  www.easthouse.org (p) [phone]     [facility] - Residental (2 Locations)           [facility]          [address] [place] [address]            [facility]          [address] [place] [address]  *Medicaid Managed Care and Private Ins. Only  (p) [phone] [employer])    (p) [phone] [facility])    [employer] - Supportive Living   Locations in [place], [place], [place], [place] and [place]   *Apply through email address: [email or web address] No Number Listed  Use email address to apply    [employer]   [address] [place] [address] [phone]   [facility]   [address] [place] [address]  www.helio.health (p) [phone]     [facility]  [address] [place] [address]  BuyingShow.es (p) [phone]       [facility]   [address] [place] [address] [phone]   [facility] Supportive Living   [address] [place] [address] [place] [address]  www.ywca.org (p) [phone]       [facility] ADMINISTRATION:  [facility]  [address] [place] [address] [phone]     [place]  [address] [place] [address] [phone]- [address]  [address] [place] [address] [phone]     [place]  [address] [place] [address] [phone]    [facility]  [address] [place] [address]  *only  offers intake appointment if veteran is unable to be seen at another facility.  (p) [phone]   [facility]   [address] [place] [address] ([phone]       Resources for Drug and/or Alcohol Treatment:  https://www.barber.com/  https://ncadd-ra.org/    ADDITIONAL RESOURCES:  Al-Anon/Al-Teen (http://www.al-anon.alateen.org/)  715-847-4593   Alcohol Anonymous (AA)  (RentalRefinancing.at)   [place]. [phone]  [place]: [phone]  Phone Meetings: [phone]   Cocaine Anonymous (CA) (SatelliteStock.ch)  [phone]   Heroin Anonymous (HA) (http://www.heroinanonymous.org/)     Marijuana Anonymous (MA) (http://www.marijuana-anonymous,org/)  3402650605   Drug and Alcohol Council [phone]   Life Line 973-376-4699 or [address] on Alcohol & Substance Abuse Lenor Derrick) [phone]   Narcotics Anonymous [place] 24-hour Hotline  (http://rochesterny-na.org/)   (http://flana.net)    [place]: 319-220-1228  [place]: [phone]   Nicotine Anonymous (NicA) (http://www.nicotine-anonymous.org/)  [phone]   [place] Office of Alcoholism & Substance Abuse Services (https://www.oasas.https://fisher-stokes.com/)  857-051-1154   Pathways Methadone Maintenance Program  [address] [place], [place] [address] [phone]  M-F 6am-3pm   Refuge Recovery (http://www.refugerecovery.org/)  [email or web address]   ROCovery Fitness (http://www.rocoveryfitness.org/)   [address] [address] [place], [place] [address] [phone]  mail@rocoveryfitness .Lorraine Lax Recovery Methadone Maintenance Program  [address] [place] [address] [phone]

## 2022-01-25 NOTE — ED Notes (Signed)
01/25/22 1914   [place] Suicide Severity Rating Scale Screen - Past Month        1. Have you wished you were dead or wished you could go to sleep and not wake up? Yes        2. Have you actually had any thoughts of killing yourself? No        6. Have you done anything, started to do anything, or prepared to do anything to end your life? No
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
## 2022-01-25 NOTE — CPEP Notes (Signed)
Received call from pt's mother requesting that pt call her. Mom is not listed as an emergency contact. Could not confirm or deny pt's presence in CPEP. Placed Mom on hold and attempted to ask pt directly for permission to speak with Mom. Mom hung up the call while on hold, pt was in the bathroom at the time.

## 2022-01-26 MED ORDER — ACETAMINOPHEN 325 MG PO TABS *I*
650.0000 mg | ORAL_TABLET | Freq: Once | ORAL | Status: AC
Start: 2022-01-26 — End: 2022-01-26
  Administered 2022-01-26: 650 mg via ORAL
  Filled 2022-01-26: qty 2

## 2022-01-26 MED ORDER — DIPHENHYDRAMINE HCL 25 MG ORAL SOLID *WRAPPED*
25.0000 mg | Freq: Once | ORAL | Status: AC
Start: 2022-01-26 — End: 2022-01-26
  Administered 2022-01-26: 25 mg via ORAL
  Filled 2022-01-26: qty 1

## 2022-01-26 NOTE — CPEP Notes (Signed)
Pt appeared to have slept through the night without incident or concern. Respirations unlabored. Requested and received Tylenol and Bemadryl headache and sleep, Q15 min safety checks and comfort measures maintained.

## 2022-01-26 NOTE — CPEP Notes (Signed)
Provided pt with bus passes and discharge instructions. Encouraged pt to follow up with one of the many options for support in the community that CPEP put her in contact with. Pt also given all belongings and placed in medicab.
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
## 2022-01-26 NOTE — Discharge Instructions (Addendum)
CPEP Discharge Instructions    Discharge Date: 01/26/2022        Follow-ups:    Upon discharge from [facility] it is recommended  you follow up with mental health and substance use treatment. You were previously  connected with [facility] on 1/27 for a phone intake appointment. If you would like to continue with the process, follow up with them on Monday. Maralyn Sago [phone].    For your OB care:  [facility] [place] [place]   [facility]   [place] [address] [phone]      [place]:  [phone]  [phone]     [facility]:  [place], patient could get on list for supportive housing.  Ronny Bacon is contact for that program at [phone].    The [facility] Ambulatory Crisis Call Line  [phone]    Help for current patients with mental health emergencies    Hours of Operation: Monday - Friday, 8:00 AM - 8:00 PM    Saturday and Sunday, 10:00A - 6:30P (starting 06/23/18)     What happened when I call the line?    When you call  The crisis line, you will speak with a Master's level trained counselor who is experienced in crisis management.  The counselor will help you talk through the problem and suggest possible solutions.  Your call line counselor will also give you information about other local services and resources that can help with your concerns.    For residents of [place], [facility] provides Mobile Crisis Unit that your counselor will contact if necessary.    What is a crisis?    A crisis is a moment when your mental health issues have become overwhelming, and you are finding it difficult to cope with the stresses of daily life.  They usually occur when mental illness is left untreated, or if a care plan has not been followed.  It can also be caused by sudden, drastic changes in life, such as death in the family.    Sometimes a crisis can lead to dangerous situations,  like self-harm or suicide.  This is why it is important to reach out for help.    If you are experiencing any of the following symptoms, you may be having a crisis:    Inability to perform daily tasks.  When you cannot bathe, change clothes, etc.    Rapid mood swings.  Feeling suddenly very happy or very depressed.    Paranoia.  Feeling suspicious and distrustful of other people.    Isolation.  Avoiding friends and family, staying home from work or school.    Psychosis.  Loss of touch with reality, having strange ideas, hearing voices.       What should I do in a crisis?    If you are in a crisis:    Follow your safety plan.      Assess the situation.    Seek help.    If you are in immediate danger, please call 911.    Mental Health Services at [place]    After calling the Crisis Call Line, you may be referred to one of the following services.  These services may be able to help you manage your mental health.    Medicine in Psychiatry Services ([place])  [place] and [place]  Older Adults Service  Community education officer (Spanish-speaking)  [place]  Healing through Education Advocacy and Sports coach (HEAL)  [place] [phone]  If you are feeling like you need to talk to someone who understands what you are going through, please call our warm line. We are here to help. The [facility] Warm Line service is staffed by Peer Support Specialists who understand that at times, it can be easier to talk to individuals who have had similar experiences. The warm line, which is operated 24 hours per day and 7 days per week, is [phone]. Call us today.     REGISTRATION LINE  [phone]  [email or web address] [email or web address]   [facility]  [place], [place] [address]  Serving [place], [place], [place],  [place], [place] and [place], 24 hours per day and 7 days per week    [facility] is the result of a partnership between [place]. This program is funded through the [place] in coordination with the [place]. [employer] is a private, non-profit rehabilitation agency serving individuals with persistent mental illness and/or substance use disorder with the goal of helping individuals to live independent and fulfilling lives. Since 1966, [facility] has been offering personalized care that meets each persons needs while advocating for the better understanding and acceptance of mental health and substance use issues in the community. Through our 11 locations, we continue to move lives forward each day. For more information, visit [email or web address]     Need Help?   Do you feel like you are nearing a crisis and are in recovery from mental illness?   Would you like some extra help refining the skills you need to cope?  THEN, [facility] IS FOR YOU.    How do I get access to [facility]?  YOU MAY UTILIZE [facility] by calling [phone] and voluntarily referring yourself. When you call, you will complete a brief telephone pre-registration. Once you are pre-registered and if there is an opening in the house, you will be offered a short-term stay.     What would make me eligible for respite at [facility]?  To be eligible for [facility] services, you must:   Be 18 years or older   Have a diagnosis of a serious mental illness (you may also have a co-occurring substance use or physical health condition), and be experiencing a psychiatric crisis   Benefit from a 3- to 5-day diversion with medical and behavioral health conditions sufficiently managed   Have permanent housing   Be able to maintain acceptable personal hygiene   Be able to prepare meals, clean up after yourself, and administer  your own medications   Complete a voluntary self-referral and accept the terms of registration   Be willing to follow the conditions of the guest agreement upon entering [facility]   Be a resident of [place], [place], [place], [place], [place] or [facility]    What would make an individual ineligible for [employer]?  Individuals ineligible for services:   Pose an immediate threat to themselves or others, requiring a higher level of care   Are registered sex offenders   Are engaged in an illegal activity   Display symptoms of active engagement in  substance use manifested in physical dependence or aggressive and destructive behavior   Are not willing or able to respect and follow the guest agreement during his or her stay   Have utilized [facility] for more than 21 days per calendar year    [facility] provides a peer-based, recovery-oriented alternative to existing intensive and costly acute crisis services. As an Physiological scientist, you must be able to benefit from a short-term diversion program and have medical and behavioral health conditions sufficiently managed. Your average length of stay at [place] will be 3 to 5 days and then you will have a Peer Support Specialist follow up on your progress for up to 30 or 60 days. [facility] is an [employer] administered in collaboration with the [place] ([place]).     [facility] IS A NO FEE SERVICE. The site, which is staffed 24 hours per day, has 8 single bedrooms, a common kitchen area, bathrooms, a community living area, laundry facilities, and staff office. The property is well-served by public transportation and easily accessible to other amenities. All [facility] guests are responsible for bringing their own food and personal hygiene items to the site.     AS AN [facility] GUEST, you can come and go as you please. If you need to go to work, appointments or other scheduled  meetings, you may do so. All [facility] staff are peers who identify as living with a mental illness who provide guidance, mentoring, as well as support to others who are experiencing crisis. In other words, we are here to help.  What services are provided at [facility]?    Our menu of services includes:   Recovery support groups   One-on-one coaching   Exercise groups   Yoga   Meditation   Musical groups including drumming   Arts and crafts group   WRAP planning group   [place].  Substance Use or Abuse.  Crisis Intervention.    [facility] provides immediate crisis intervention, assessment, supportive counseling, peer services, care management, and follow-up services for individuals and families facing various types of crisis. Our licensed clinicians are always available to help in dealing with various situations including, but not limited to:   Thoughts of self-harm   Relationship problems   Depression and anxiety   Substance use concerns   Problems following up with mental health & care providers    Our behavioral health services are available to those 18 years and older. They completely voluntary, and available on a walk-in basis--no referral is needed. Individuals will experience a comfortable, relaxed environment, without the wait of an ER, and are free to come and go as they please.    Open Monday - Friday from 8 am - Midnight  No appointments necessary, walk-ins welcome from [place]    For more information please call:  [phone]    For walk-ins please visit:  [place]  and [facility]  [address]  [place], [place] [address]    [email or web address]         The clinical team recommends the following actions for creating a safe environment post discharge utilize options as identified in completed safety plan  , reach out to family and/or friends for support ,  remove any weapons or firearms from home, remove access to sharp objects, remove illicit substances and/or alcohol from home , take medications as prescribed , remove old medications no longer prescribed , and maintain access to a Narcan kit    Level of outreach indicated if patient fails new intake or COPS (Comprehensive Outpatient Psychiatric Service) appointment: Routine Program Follow-up    When to call for help:    Call your psychiatric outpatient provider if experiencing any of these symptoms: increased irritability, sleep changes, appetite changes, energy changes, thoughts to harm yourself or others, anxiety, fear, auditory or visual hallucinations.  [facility] Helpline (24 hours/7 days) [phone] Product/process development scientist)  Mobile Crisis team: [phone]    General Instructions:  Other written information given to the patient: No            The above information has been discussed with me and I have received a copy.  I understand that I am advised to follow the instructions given to me to appropriately care for my condition.

## 2022-01-26 NOTE — CPEP Notes (Signed)
[facility]:    This RN spoke with Marcelle Smiling who stated that [facility] would not [facility] due to her late stage of pregnancy. She provided some alternatives.    [place]:  [phone]  [phone]    [facility]:  [phone]    [facility]:  [phone]    Also that through [facility] patient could get on list for supportive housing.  Marcelle Smiling is contact for that program at [phone].  -Needs a letter on letter head verifying that pt is homeless or near homeless  -Needs paperwork with a substance use dx

## 2022-01-30 NOTE — Communication Body (Signed)
[facility] at [address]  [facility]  [place] [address]  Phone: [phone]  Fax: [phone] January 30, 2022       Alexis Vang Velazco  [address]  [address]  [place] [address]        Dear Alexis Vang:  We do not have a phone number on file for you but you are due for a OBC which is just a check up on you and baby if you could give me a call at [phone] that would be great!          Sincerely yours,    [employer]

## 2022-02-12 ENCOUNTER — Telehealth: Payer: Self-pay

## 2022-02-12 NOTE — Telephone Encounter (Signed)
Clinical research associate received call from [employer], staff member from [facility].  She was calling for pt, pt was in the background.  She states she is c/o what feels like contractions and pain vaginally.  She has also had a migraine today.  Pt is having lower back pain, but no LOF/VB.  She reports pains started 48 min ago.  Pt has only felt baby move once today, that was 1.5 hours ago.  Encouraged pt to go to [facility] triage for r/o labor. Pt and Destiny, staff member agreeable to plan.   [facility] does not have a way to transport pts, so ambulance will have to be called.  Report called to Margaretmary Lombard, RN at [facility] triage.

## 2022-02-20 ENCOUNTER — Ambulatory Visit: Payer: Medicaid Other

## 2022-02-20 ENCOUNTER — Encounter: Payer: Self-pay | Admitting: Obstetrics and Gynecology

## 2022-02-20 ENCOUNTER — Ambulatory Visit: Payer: Medicaid Other | Attending: Obstetrics and Gynecology | Admitting: Obstetrics and Gynecology

## 2022-02-20 ENCOUNTER — Other Ambulatory Visit: Payer: Self-pay

## 2022-02-20 VITALS — BP 110/58 | HR 85

## 2022-02-20 VITALS — BP 132/76 | HR 97 | Ht 65.0 in | Wt 164.2 lb

## 2022-02-20 DIAGNOSIS — B86 Scabies: Secondary | ICD-10-CM | POA: Insufficient documentation

## 2022-02-20 DIAGNOSIS — O09299 Supervision of pregnancy with other poor reproductive or obstetric history, unspecified trimester: Secondary | ICD-10-CM | POA: Insufficient documentation

## 2022-02-20 DIAGNOSIS — Z3A38 38 weeks gestation of pregnancy: Secondary | ICD-10-CM | POA: Insufficient documentation

## 2022-02-20 DIAGNOSIS — F1990 Other psychoactive substance use, unspecified, uncomplicated: Secondary | ICD-10-CM | POA: Insufficient documentation

## 2022-02-20 DIAGNOSIS — O368130 Decreased fetal movements, third trimester, not applicable or unspecified: Secondary | ICD-10-CM | POA: Insufficient documentation

## 2022-02-20 DIAGNOSIS — O09293 Supervision of pregnancy with other poor reproductive or obstetric history, third trimester: Secondary | ICD-10-CM

## 2022-02-20 DIAGNOSIS — O0993 Supervision of high risk pregnancy, unspecified, third trimester: Secondary | ICD-10-CM

## 2022-02-20 DIAGNOSIS — Z8632 Personal history of gestational diabetes: Secondary | ICD-10-CM | POA: Insufficient documentation

## 2022-02-20 DIAGNOSIS — Z2839 Other underimmunization status: Secondary | ICD-10-CM | POA: Insufficient documentation

## 2022-02-20 DIAGNOSIS — Z59 Unhousedness unspecified: Secondary | ICD-10-CM | POA: Insufficient documentation

## 2022-02-20 DIAGNOSIS — F29 Unspecified psychosis not due to a substance or known physiological condition: Secondary | ICD-10-CM | POA: Insufficient documentation

## 2022-02-20 DIAGNOSIS — A599 Trichomoniasis, unspecified: Secondary | ICD-10-CM | POA: Insufficient documentation

## 2022-02-20 DIAGNOSIS — O09893 Supervision of other high risk pregnancies, third trimester: Secondary | ICD-10-CM

## 2022-02-20 DIAGNOSIS — O34219 Maternal care for unspecified type scar from previous cesarean delivery: Secondary | ICD-10-CM | POA: Insufficient documentation

## 2022-02-20 DIAGNOSIS — O09899 Supervision of other high risk pregnancies, unspecified trimester: Secondary | ICD-10-CM | POA: Insufficient documentation
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
## 2022-02-20 NOTE — Assessment & Plan Note (Addendum)
-  Pt has a history of PSU of cocaine, methamphetamine and heroin. Pt reports last use 02/03/2022  -Pt has had several negative UCDS, last positive for cocaine was 01/21/2022  -She is now living at [facility]  -SW in to meet patient today  -Last USN 1/26 normal G&F. She has not been receiving weekly NST's, added on for today

## 2022-02-20 NOTE — Progress Notes (Signed)
Patient at [redacted]w[redacted]d given "20 Weeks and BeyondAdministrator, arts.     Reviewed contents with patient and she states understanding.     Admission envelope given.    Eugenia Pancoast, RN

## 2022-02-20 NOTE — Progress Notes (Signed)
C/S request date 02/27/22 at 1:30, ok per Dr. Onnie Boer    Lexi-please call [facility] once C/S date is established. She will need to return 2/28 as well.      OBGYN SURGICAL ADMISSION FORM     Date submitted: 02/20/2022       Submitted by: [name], NP  Site where patient is seen: Special care clinic     Admission type:  SDA (24 hr or more anticipated stay)  Date of surgery/procedure: 02/27/22  Is the Patient [place] Appropriate?:  No  Surgery location:  [facility]  ASA class:    ASA 3 - Patient with moderate systemic disease with functional limitations  Procedure Urgency:   Semi-Elective Procedure    DOB:  Jan 22, 1991  AGE: 30 y.o.  BMI: Body mass index is 27.32 kg/m.  Phone:  [phone] (home) There is no work phone number on file.  There is no such number on file (mobile).  Address:  [address]  [place] [address]   Contact (if pt is a minor or living in another facility):     Preferred language: English  My Chart Status: Pending Activation [3]    OBGYN Attending physician:  [facility] attending                 Admitting diagnosis ICD 10 : H/o c-section    Z98.891  Procedure [CPT-4]:  C-section    [phone]  Special equipment: None    Insurance company: Attending: NPI, Facet ID, Tax ID   Policy #: Percert #:   2nd insurance company:  Policy #: Precert #:     Allergies: Allergies: No Known Allergies (drug, envir, food or latex)    Medical justification for admission 1 day prior to surgery/procedure: No     Types of anesthesia: Regional  Estimated length of case: 2 hours    Preadmission testing:  - COVID testing, Type and screen, and CBC    Office preop with MD:   Preop testing to be done on / at: prior to arrival DOS  Clearance appointments / Consults: n/a  Office appt w/ RN: 1 week(s) postop     Expected return to work: 8 weeks

## 2022-02-20 NOTE — Progress Notes (Signed)
[facility]    SUBJECTIVE  Alexis Vang is a 31 y.o., [phone] female presenting for routine OBC at [redacted]w[redacted]d.     [place] would like something to help her sleep. She takes benadryl, but does not find it helpful. Pt reports an increase in anxiety, stating her sister overdosed and is currently pregnant in the ICU.    She is excited to meet her son and is asking about supplies. Pt states she is currently living at [facility] and her last drug use was 02/03/22. She reports using "everything".    [place] states "I can't say I feel the baby move" and then later reported feeling the baby move, but less than baseline.      Contractions: No   Loss of fluid: No   Bleeding: No   FM:  decreased       Pregnancy complicated by:   Patient Active Problem List   Diagnosis Code   . Paranoia F22   . Psychosis, unspecified psychosis type F29   . Scabies B86   . High-risk pregnancy O09.90   . PTSD (post-traumatic stress disorder) F43.10   . History of cesarean section complicating pregnancy O34.219   . H/O fetal anomaly in prior pregnancy, currently pregnant O09.299   . History of gestational diabetes in prior pregnancy, currently pregnant O09.299, Z58.32   . Hx of preeclampsia, prior pregnancy, currently pregnant O09.299   . Substance use disorder F19.90   . Homeless Z59.00   . Asthma during pregnancy O99.519, J45.909   . Trichomonas infection A59.9   . Staph aureus infection A49.01   . Rash of hands R21   . Posttraumatic stress disorder with dissociative symptoms F43.10   . Suicidal ideation R45.851   . Rubella non-immune status, antepartum O09.899, Z28.39     Problem list, vitals, family & social history, medications, and allergies were reviewed and updated as appropriate today.      OBJECTIVE  Vitals:    02/20/22 1320   BP: 132/76   Pulse: 97   Weight: 74.5 kg (164 lb 3.2 oz)   Height: 1.651 m (5\' 5" )      Fetal Heart Rate: 148       Urine POCT:  Patient did not leave urine specimen     Exam:  General appearance - alert, well  appearing, and in no distress. Thoughts were tangential   Abdomen - soft, nontender  Pelvic - VULVA: normal appearing vulva with no masses, tenderness or lesions, VAGINA: normal appearing vagina with normal color and discharge, no lesions, CERVIX: normal appearing cervix without discharge or lesions, UTERUS: 38 week uterus, soft and nontender      ASSESSMENT / PLAN  30 y.o., [id number] at [redacted]w[redacted]d with high-risk pregnancy.     Orders Placed This Encounter   Procedures   . Group b strep culture     Standing Status:   Future     Number of Occurrences:   1     Standing Expiration Date:   03/22/2023   . Chlamydia plasmid DNA Amplification     OB/GYN Status:Pregnant  Source: Cervix/Endocvx  Screening or diagnostic: Screening  HPV Testing (requested on Liquid based pap test only):  Co-Testing (Pap and HPV)  STD Testing (Liquid based PAP test only): PDC/PDG/TRICD  Clinical History: Unknown   . N. Gonorrhoeae DNA Amplification     OB/GYN Status:Pregnant  Source: Cervix/Endocvx  Screening or diagnostic: Screening  HPV Testing (requested on Liquid based pap test only):  Co-Testing (Pap and  HPV)  STD Testing (Liquid based PAP test only): PDC/PDG/TRICD  Clinical History: Unknown   . Trichomonas DNA amplification     OB/GYN Status:Pregnant  Source: Cervix/Endocvx  Screening or diagnostic: Screening  HPV Testing (requested on Liquid based pap test only):  Co-Testing (Pap and HPV)  STD Testing (Liquid based PAP test only): PDC/PDG/TRICD  Clinical History: Unknown   . GYN Cytology     Standing Status:   Future     Number of Occurrences:   1     Standing Expiration Date:   05/20/2022     Order Specific Question:   Source     Answer:   Cervix/Endocvx     Order Specific Question:   Screening or diagnostic     Answer:   Screening     Order Specific Question:   HPV Testing (requested on Liquid based pap test only)     Answer:   Co-Testing (Pap and HPV)     Order Specific Question:   STD Testing (Liquid based PAP test only)     Answer:    PDC/PDG/TRICD     Order Specific Question:   OB/GYN Status     Answer:   Pregnant [4]     Order Specific Question:   Clinical History     Answer:   Unknown   . CBC     Standing Status:   Future     Standing Expiration Date:   05/20/2022   . COVID-19 PCR     Standing Status:   Future     Standing Expiration Date:   05/20/2022     Order Specific Question:   Please select the patient's occupation.     Answer:   None of above     Order Specific Question:   Does this patient currently have symptoms concerning for COVID?     Answer:   No     Order Specific Question:   What is the reason for testing this ASYMPTOMATIC patient?     Answer:   Preoperative/preprocedural     Order Specific Question:   When is procedure anticipated?     Answer:   24h or more     Order Specific Question:   Source?     Answer:   Nasal   . Type and screen     Standing Status:   Future     Standing Expiration Date:   05/20/2022     Order Specific Question:   Are you pregnant now or within the past 3 months     Answer:   Unknown     Order Specific Question:   Transfusion done anywhere within the last 3 months     Answer:   Unknown     Today's Plan:  No problem-specific Assessment & Plan notes found for this encounter.    Follow-up:   Return to the office in 5 days for Covid swab and to further reinforce C/S process/date/time.    Discussed with Dr. Rosalva Ferron, NP  02/20/2022  3:02 PM

## 2022-02-20 NOTE — Procedures (Signed)
Fetal Non-Stress Test    Patient: [name] Masterson Age: 31 y.o.  Date of Birth: [birth year]  TRV:UYEBXI  eMRN: [id number]  GA: [redacted]w[redacted]d  Maternal  Heart Rate: 85 (02/20/22 1428)  BP: 110/58 (02/20/22 1428)    Reason For Exam: DFM  Education Done:  External fetal monitoring NST Manual Stimulation VAS Other (specify) Fetal kick counts when to call the dr      Fetal non-stress test for singleton pregnancy  Pre Procedure Diagnosis: Decreased fetal movements in third trimester  Post Procedure Diagnosis: NST (non-stress test) reactive            NST Start Time:  02/20/2022 2:28 PM            Uterine Irritability: Yes            Contractions: irregular    NST Fetus A  Variability: moderate  FHR Baseline: 125  Decelerations: none   Accelerations: at least 15 BPM for 15 seconds  Stimulation: none  NST Interpretation: reactive    End Time:  02/20/2022 2:53 PM          Duration of test (min): 25  Location of NST Fetal Heart Tracing: Archived electronically  Comments:  DFM overall (unable to tell writer when the last movement was when asked), denies LOF, VB and CTX.         Test performed By: Lauralyn Primes, RN  02/20/2022 2:54 PM

## 2022-02-21 ENCOUNTER — Telehealth: Payer: Self-pay | Admitting: Obstetrics and Gynecology

## 2022-02-21 ENCOUNTER — Other Ambulatory Visit: Payer: Self-pay | Admitting: Obstetrics and Gynecology

## 2022-02-21 DIAGNOSIS — O34219 Maternal care for unspecified type scar from previous cesarean delivery: Secondary | ICD-10-CM

## 2022-02-21 LAB — GROUP B STREP CULTURE: Group B Strep Culture: DETECTED — AB

## 2022-02-21 MED ORDER — MELATONIN 3 MG PO TABS *I*
3.0000 mg | ORAL_TABLET | Freq: Every evening | ORAL | 0 refills | Status: DC | PRN
Start: 2022-02-21 — End: 2022-04-12

## 2022-02-21 NOTE — Assessment & Plan Note (Signed)
-  Negative TOC and HIV  -Pt requested STI screening today with her pap smear. Cx sent

## 2022-02-21 NOTE — Assessment & Plan Note (Signed)
-  GTT not completed, declined POCT BG today

## 2022-02-21 NOTE — Assessment & Plan Note (Signed)
Resolved with treatment 

## 2022-02-21 NOTE — Assessment & Plan Note (Signed)
-  Reports taking ASA now that she is a [employer]/r[employer]  -Normotensive and asymptomatic

## 2022-02-21 NOTE — Assessment & Plan Note (Signed)
-  Pt reports taking Escitalopram 20 mg daily  -Hard to keep pt focused on one topic today

## 2022-02-21 NOTE — Assessment & Plan Note (Signed)
-  Recommend MMR PPM

## 2022-02-21 NOTE — Assessment & Plan Note (Signed)
-  Prior C/S, plan for repeat.  -Discussed options with patient and recommendations, she would like a repeat C/S. Consent read verbatim. Writer did need to answer multiple questions and clarify the consent/procedure. Ultimately, [name] understood she was having a surgical procedure to deliver her son, the rationale, risks and benefits.  -Surgical booking form completed for 3/2 at 1:30  -Clinical research associate will call [facility] once this is confirmed as I am unsure if [name] will retain the information provided regarding labs, covid swab, NPO after MN, arrival time and where to go.

## 2022-02-21 NOTE — Assessment & Plan Note (Signed)
-  PN record reviewed, she is normotensive  -GBS swab, pap smear and STI screening completed today. Pt agreeable to all testing  -Melatonin 3 mg sent to pharmacy through [facility]  -Pt is to call with any pain, bleeding, LOF or DFM

## 2022-02-21 NOTE — Telephone Encounter (Signed)
Writer called Alana at [facility] [phone] to discuss Alexis Vang's plan of care over the next week. I informed her I sent in a prescription for Melatonin as Alexis Vang complained about not sleeping well. Alana was notified of Alexis Vang's visit next week on 2/28 at 9:30 with the RN for a covid swab and that Alexis Vang would need labs completed as well. Alana was given the phone number for L&D and knows to have Alexis Vang call it after 2pm on 3/1 for her arrival time on 3/2. She is aware to have Alexis Vang remain NPO after midnight. Alana reports she will be able to assist Alexis Vang with these visits, calls and reminders. The patient will have transportation through [facility] for her appointment and C/S.

## 2022-02-21 NOTE — Assessment & Plan Note (Signed)
-  Pt is now living at [facility]  -SW is involved

## 2022-02-22 LAB — CHLAMYDIA NAAT (PCR): Chlamydia plasmid DNA AMplification: 0

## 2022-02-22 LAB — N. GONORRHOEAE NAAT (PCR): N. Gonorrhoeae DNA Amplification: 0

## 2022-02-22 LAB — TRICHOMONAS NAAT (PCR): Trichomonas DNA Amplification: 0

## 2022-02-23 ENCOUNTER — Encounter: Payer: Self-pay | Admitting: Obstetrics and Gynecology

## 2022-02-23 DIAGNOSIS — O99820 Streptococcus B carrier state complicating pregnancy: Secondary | ICD-10-CM | POA: Insufficient documentation

## 2022-02-25 ENCOUNTER — Ambulatory Visit: Payer: Medicaid Other

## 2022-02-25 ENCOUNTER — Other Ambulatory Visit: Payer: Self-pay

## 2022-02-25 ENCOUNTER — Other Ambulatory Visit: Payer: Medicaid Other

## 2022-02-25 DIAGNOSIS — Z01812 Encounter for preprocedural laboratory examination: Secondary | ICD-10-CM | POA: Insufficient documentation

## 2022-02-25 DIAGNOSIS — O34219 Maternal care for unspecified type scar from previous cesarean delivery: Secondary | ICD-10-CM

## 2022-02-25 DIAGNOSIS — Z20822 Contact with and (suspected) exposure to covid-19: Secondary | ICD-10-CM | POA: Insufficient documentation

## 2022-02-25 DIAGNOSIS — Z01818 Encounter for other preprocedural examination: Secondary | ICD-10-CM | POA: Insufficient documentation

## 2022-02-25 DIAGNOSIS — Z3A38 38 weeks gestation of pregnancy: Secondary | ICD-10-CM

## 2022-02-25 DIAGNOSIS — Z20828 Contact with and (suspected) exposure to other viral communicable diseases: Secondary | ICD-10-CM | POA: Insufficient documentation

## 2022-02-25 LAB — TYPE AND SCREEN
ABO RH Blood Type: A POS
Antibody Screen: NEGATIVE

## 2022-02-25 LAB — CBC
Hematocrit: 32 % — ABNORMAL LOW (ref 34–45)
Hemoglobin: 10.2 g/dL — ABNORMAL LOW (ref 11.2–15.7)
MCH: 30 pg (ref 26–32)
MCHC: 32 g/dL (ref 32–36)
MCV: 95 fL (ref 79–95)
Platelets: 264 10*3/uL (ref 160–370)
RBC: 3.4 MIL/uL — ABNORMAL LOW (ref 3.9–5.2)
RDW: 13.2 % (ref 11.7–14.4)
WBC: 10.4 10*3/uL — ABNORMAL HIGH (ref 4.0–10.0)

## 2022-02-25 NOTE — Progress Notes (Addendum)
Social Work Note -   Briefly met with pt to check in.  She continues to stay at [facility] - though uncertain how long she will continue to stay here.  She expressed concern about some of the residents having Covid - does not want to bring baby back to this location. She repeatedly stated that she is going to re-locate, but could not specify where she would go.  She is aware of planned delivery on Thursday - as is staff at [facility].  Plan -   1.SW to continue to follow, anticipate consulting CPS hotline after delivery for assistance around safe d/c planning for baby  2.Email sent to inpatient OB nursing leadership to begin discussion around appropriate post-delivery unit for pt and baby, as additional support & supervision around pt's interactions with her baby are likely    Eloise Harman, [place], [phone]

## 2022-02-25 NOTE — Progress Notes (Signed)
Pt in office for pre procedure covid swab and blood work.  Pt tolerated well.  SW to see following RN appointment.

## 2022-02-26 LAB — COVID-19 NAAT (PCR): COVID-19 NAAT (PCR): NEGATIVE

## 2022-02-26 NOTE — Anesthesia Preprocedure Evaluation (Addendum)
Anesthesia Pre-operative History and Physical for Alexis [place] Leer    Highlighted Issues for this Procedure:  Alexis Vang is a 31 y.o. female [phone] [redacted]w[redacted]d with PMHx of psychosis, paranoia, substance abuse d/o, asthma, hx of gestational DM2, hx of gestational HTN presenting today for C-section.    *POCT urine/serum tox to be done    BMI: 27    Placental location: posterior, mid  Hx of previous C-section: Yes, in setting of pre-eclampsia    Allergies: No  Asthma: Yes    HTN: No  Seizure hx: Yes, after seroquel overdose  Renal function: wnl  Spinal surgeries/devices/pathologies: no  Anticoagulation: no    T&S completed: Yes  Antibody: negative  H/H: 10/32  Platelets: 264  Other labs: covid negative    Anes hx: hx of previous c-section with spinal; no issues      .  Marland Kitchen  Anesthesia Evaluation Information Source: records, patient     ANESTHESIA HISTORY     Denies anesthesia history  Pertinent(-):  No History of anesthetic complications or Family hx of anesthetic complications    GENERAL    + Substance abuse          cocaine, heroin, amphetamine  Pertinent (-):  No history of anesthetic complications or Family Hx of Anesthetic Complications     PULMONARY    + Asthma  Pertinent(-):  No sleep apnea    CARDIOVASCULAR  Good(4+METs) Exercise Tolerance    + Hypertension (Not currently, in previous pregnancy)          gestational    GI/HEPATIC/RENAL   NPO: > 8hrs ago (solids) and > 2hrs ago (clears)    Pertinent(-):  No GERD, nausea or vomiting  NEURO/PSYCH/ORTHO    + Neuropsychiatric Issues (psychosis)          PTSD, suicide attempt    + Seizures (2/2 seroquel overdose)  Pertinent(-):  No cerebrovascular event or peripheral nerve issue    ENDO/OTHER    + Diabetes Mellitus (Not currently, in previous pregnancy )        gestational    HEMATOLOGIC  Pertinent(-):  No bruising/bleeding easily, coagulopathy or anticoagulants/antiplatelet medications         Physical Exam    Airway            Mouth opening: normal             Mallampati: II            TM distance (fb): >3 FB            Neck ROM: full              Piercings: other   removed  Dental        Cardiovascular  Normal Exam           Rhythm: regular           Rate: normal      Neurologic    Normal Exam     Pulmonary   Normal Exam    breath sounds clear to auscultation    Mental Status   Normal Exam         ________________________________________________________________________  PLAN  ASA Score  3  Anesthetic Plan spinal      ; Line ( use current access); Monitoring (standard ASA); Positioning (supine and left uterine tilt); PONV Plan (ondansetron); Pain (neuraxial morphine); PostOp (PACU)Standard Attestation    Informed Consent     Risks:         Risks  discussed were commensurate with the plan listed above with the following specific points: N/V, aspiration, sore throat, hypotension, headache, failed block, infection, dizziness and unsteadiness, Damage to: eyes, nerves, teeth and blood vessels, allergic Rx, awareness and unexpected serious injury.    Anesthetic Consent:         Anesthetic plan (and risks as noted above) were discussed with patient    Blood products Consent:        Use of blood products discussed with: patient     Plan also discussed with team members including:       resident, attending and surgeon    Responsible Anesthesia Provider Attestation:  I attest that the patient or proxy understands and accepts the risks and benefits of the anesthesia plan. I also attest that I have personally performed a pre-anesthetic examination and evaluation, and prescribed the anesthetic plan for this particular location within 48 hours prior to the anesthetic as documented. Lillia Dallas, MD  02/27/22, 3:40 PM

## 2022-02-27 ENCOUNTER — Encounter: Payer: Self-pay | Admitting: Maternal & Fetal Medicine

## 2022-02-27 ENCOUNTER — Inpatient Hospital Stay
Admission: RE | Admit: 2022-02-27 | Discharge: 2022-03-01 | DRG: 540 | Payer: Medicaid Other | Source: Ambulatory Visit | Attending: Maternal & Fetal Medicine | Admitting: Maternal & Fetal Medicine

## 2022-02-27 ENCOUNTER — Observation Stay: Payer: Medicaid Other | Admitting: Student in an Organized Health Care Education/Training Program

## 2022-02-27 ENCOUNTER — Encounter: Admission: RE | Payer: Self-pay | Source: Ambulatory Visit | Attending: Maternal & Fetal Medicine

## 2022-02-27 DIAGNOSIS — O9952 Diseases of the respiratory system complicating childbirth: Secondary | ICD-10-CM | POA: Diagnosis present

## 2022-02-27 DIAGNOSIS — Z98891 History of uterine scar from previous surgery: Secondary | ICD-10-CM

## 2022-02-27 DIAGNOSIS — O99824 Streptococcus B carrier state complicating childbirth: Secondary | ICD-10-CM | POA: Diagnosis present

## 2022-02-27 DIAGNOSIS — J45909 Unspecified asthma, uncomplicated: Secondary | ICD-10-CM | POA: Diagnosis present

## 2022-02-27 DIAGNOSIS — Z20822 Contact with and (suspected) exposure to covid-19: Secondary | ICD-10-CM | POA: Diagnosis present

## 2022-02-27 DIAGNOSIS — Z3490 Encounter for supervision of normal pregnancy, unspecified, unspecified trimester: Secondary | ICD-10-CM

## 2022-02-27 DIAGNOSIS — O0993 Supervision of high risk pregnancy, unspecified, third trimester: Principal | ICD-10-CM

## 2022-02-27 DIAGNOSIS — Z3A39 39 weeks gestation of pregnancy: Secondary | ICD-10-CM

## 2022-02-27 DIAGNOSIS — O34211 Maternal care for low transverse scar from previous cesarean delivery: Principal | ICD-10-CM | POA: Diagnosis present

## 2022-02-27 LAB — CBC
Hematocrit: 32 % — ABNORMAL LOW (ref 34–45)
Hemoglobin: 10.4 g/dL — ABNORMAL LOW (ref 11.2–15.7)
MCH: 31 pg (ref 26–32)
MCHC: 33 g/dL (ref 32–36)
MCV: 96 fL — ABNORMAL HIGH (ref 79–95)
Platelets: 263 10*3/uL (ref 160–370)
RBC: 3.3 MIL/uL — ABNORMAL LOW (ref 3.9–5.2)
RDW: 13 % (ref 11.7–14.4)
WBC: 12 10*3/uL — ABNORMAL HIGH (ref 4.0–10.0)

## 2022-02-27 LAB — DRUG SCREEN CHEMICAL DEPENDENCY, URINE
Amphetamine,UR: NEGATIVE
Benzodiazepinen,UR: NEGATIVE
Cocaine/Metab,UR: NEGATIVE
Fentanyl, UR: NEGATIVE ng/mL
Opiates,UR: NEGATIVE
THC Metabolite,UR: NEGATIVE

## 2022-02-27 LAB — TYPE AND SCREEN
ABO RH Blood Type: A POS
Antibody Screen: NEGATIVE

## 2022-02-27 SURGERY — Surgical Case
Anesthesia: Spinal | Site: Abdomen | Wound class: Clean Contaminated

## 2022-02-27 MED ORDER — SODIUM CHLORIDE 0.9 % FLUSH FOR PUMPS *I*
0.0000 mL/h | INTRAVENOUS | Status: DC | PRN
Start: 2022-02-27 — End: 2022-02-27

## 2022-02-27 MED ORDER — ELECTRIC BREAST PUMP (FOR PERSONAL USE) *A*
0 refills | Status: DC
Start: 2022-02-27 — End: 2022-04-12

## 2022-02-27 MED ORDER — OXYTOCIN 30 UNITS/500 ML NS *POSTPARTUM* *I*
125.0000 m[IU]/min | INTRAMUSCULAR | Status: DC | PRN
Start: 2022-02-27 — End: 2022-03-02
  Administered 2022-02-27 – 2022-02-28 (×5): 125 m[IU]/min via INTRAVENOUS
  Filled 2022-02-27: qty 500

## 2022-02-27 MED ORDER — FENTANYL CITRATE 50 MCG/ML IJ SOLN *WRAPPED*
INTRAMUSCULAR | Status: DC | PRN
Start: 2022-02-27 — End: 2022-02-27
  Administered 2022-02-27: 10 ug via INTRATHECAL

## 2022-02-27 MED ORDER — CHEWING GUM *I*
1.0000 | CHEWING_GUM | Freq: Three times a day (TID) | ORAL | Status: DC
Start: 2022-02-27 — End: 2022-03-02
  Administered 2022-02-28 – 2022-03-01 (×4): 1 via ORAL

## 2022-02-27 MED ORDER — PHENYLEPHRINE 100 MCG/ML IN NS 10 ML *WRAPPED*
INTRAMUSCULAR | Status: DC | PRN
Start: 2022-02-27 — End: 2022-02-27
  Administered 2022-02-27 (×5): 100 ug via INTRAVENOUS

## 2022-02-27 MED ORDER — OXYTOCIN 30 UNITS/500ML NS BOLUS FROM BAG *I*
10.0000 [IU] | INTRAMUSCULAR | Status: AC | PRN
Start: 2022-02-27 — End: 2022-02-28

## 2022-02-27 MED ORDER — OXYTOCIN 30 UNITS/500 ML NS *POSTPARTUM* *I*
125.0000 m[IU]/min | INTRAMUSCULAR | Status: AC
Start: 2022-02-27 — End: 2022-02-28
  Administered 2022-02-27: 125 m[IU]/min via INTRAVENOUS

## 2022-02-27 MED ORDER — LACTATED RINGERS IV SOLN *I*
100.0000 mL/h | INTRAVENOUS | Status: DC
Start: 2022-02-27 — End: 2022-02-27

## 2022-02-27 MED ORDER — DEXTROSE IN LACTATED RINGERS 5 % IV SOLN *I*
125.0000 mL/h | INTRAVENOUS | Status: DC
Start: 2022-02-27 — End: 2022-02-27
  Administered 2022-02-27: 125 mL/h via INTRAVENOUS

## 2022-02-27 MED ORDER — BUPIVACAINE IN DEXTROSE 0.75-8.25 % IT SOLN *I*
INTRATHECAL | Status: DC | PRN
Start: 2022-02-27 — End: 2022-02-27
  Administered 2022-02-27: 1.6 mL via INTRATHECAL

## 2022-02-27 MED ORDER — SODIUM CHLORIDE 0.9 % 25 ML IV SOLN *I*
10.0000 mg | Freq: Four times a day (QID) | INTRAVENOUS | Status: DC | PRN
Start: 2022-02-27 — End: 2022-03-02

## 2022-02-27 MED ORDER — OXYCODONE HCL 10 MG PO TABS *I*
10.0000 mg | ORAL_TABLET | ORAL | Status: DC | PRN
Start: 2022-02-27 — End: 2022-03-02
  Administered 2022-02-28 – 2022-03-01 (×7): 10 mg via ORAL
  Filled 2022-02-27 (×7): qty 1

## 2022-02-27 MED ORDER — HYDROMORPHONE HCL PF 1 MG/ML IJ SOLN *WRAPPED*
0.5000 mg | INTRAMUSCULAR | Status: DC | PRN
Start: 2022-02-27 — End: 2022-02-27

## 2022-02-27 MED ORDER — DIPHENHYDRAMINE HCL 25 MG ORAL SOLID *WRAPPED*
25.0000 mg | Freq: Four times a day (QID) | ORAL | Status: DC | PRN
Start: 2022-02-27 — End: 2022-03-02
  Administered 2022-02-27 – 2022-02-28 (×2): 25 mg via ORAL
  Filled 2022-02-27 (×2): qty 1

## 2022-02-27 MED ORDER — DEXTROSE 5 % FLUSH FOR PUMPS *I*
0.0000 mL/h | INTRAVENOUS | Status: DC | PRN
Start: 2022-02-27 — End: 2022-02-27

## 2022-02-27 MED ORDER — ESCITALOPRAM OXALATE 20 MG PO TABS *I*
20.0000 mg | ORAL_TABLET | Freq: Every day | ORAL | Status: DC
Start: 2022-02-27 — End: 2022-03-02
  Administered 2022-02-28 – 2022-03-01 (×2): 20 mg via ORAL
  Filled 2022-02-27 (×6): qty 1

## 2022-02-27 MED ORDER — ACETAMINOPHEN 325 MG PO TABS *I*
650.0000 mg | ORAL_TABLET | ORAL | Status: DC | PRN
Start: 2022-02-27 — End: 2022-03-02
  Administered 2022-02-27 – 2022-03-01 (×7): 650 mg via ORAL
  Filled 2022-02-27 (×8): qty 2

## 2022-02-27 MED ORDER — CEFAZOLIN 2000 MG IN STERILE WATER 20ML SYRINGE *I*
2000.0000 mg | PREFILLED_SYRINGE | Freq: Once | INTRAVENOUS | Status: AC
Start: 2022-02-27 — End: 2022-02-27
  Administered 2022-02-27: 2000 mg via INTRAVENOUS

## 2022-02-27 MED ORDER — HALOPERIDOL LACTATE 5 MG/ML IJ SOLN *I*
0.5000 mg | Freq: Once | INTRAMUSCULAR | Status: DC | PRN
Start: 2022-02-27 — End: 2022-02-27

## 2022-02-27 MED ORDER — OXYTOCIN 10 UNIT/ML IJ SOLN *I*
INTRAMUSCULAR | Status: DC | PRN
Start: 2022-02-27 — End: 2022-02-27
  Administered 2022-02-27 (×3): 3 [IU] via INTRAVENOUS

## 2022-02-27 MED ORDER — SODIUM CHLORIDE 0.9 % 25 ML IV SOLN *I*
5.0000 mg | Freq: Once | INTRAVENOUS | Status: DC | PRN
Start: 2022-02-27 — End: 2022-02-27

## 2022-02-27 MED ORDER — MEASLES, MUMPS & RUBELLA VAC SC INJ *I*
0.5000 mL | INJECTION | SUBCUTANEOUS | Status: AC
Start: 2022-02-27 — End: 2022-03-01
  Administered 2022-03-01: 0.5 mL via SUBCUTANEOUS
  Filled 2022-02-27: qty 0.5

## 2022-02-27 MED ORDER — IPRATROPIUM-ALBUTEROL 0.5-2.5 MG/3ML IN SOLN *I*
3.0000 mL | Freq: Once | RESPIRATORY_TRACT | Status: DC
Start: 2022-02-27 — End: 2022-02-27

## 2022-02-27 MED ORDER — CEFAZOLIN 2000 MG IN STERILE WATER 20ML SYRINGE *I*
PREFILLED_SYRINGE | INTRAVENOUS | Status: AC
Start: 2022-02-27 — End: 2022-02-27
  Filled 2022-02-27: qty 20

## 2022-02-27 MED ORDER — OXYTOCIN 30 UNITS IN 500ML NS WRAPPED *I*
INTRAMUSCULAR | Status: DC | PRN
Start: 2022-02-27 — End: 2022-02-27
  Administered 2022-02-27: 500 mL via INTRAVENOUS

## 2022-02-27 MED ORDER — ONDANSETRON HCL 2 MG/ML IV SOLN *I*
4.0000 mg | Freq: Four times a day (QID) | INTRAMUSCULAR | Status: DC | PRN
Start: 2022-02-27 — End: 2022-03-02

## 2022-02-27 MED ORDER — MORPHINE SULFATE *PF* 1 MG/ML IJ SOLN *I*
INTRAMUSCULAR | Status: AC
Start: 2022-02-27 — End: 2022-02-27
  Filled 2022-02-27: qty 10

## 2022-02-27 MED ORDER — OXYCODONE HCL 5 MG/5ML PO SOLN *I*
10.0000 mg | Freq: Once | ORAL | Status: DC | PRN
Start: 2022-02-27 — End: 2022-02-27

## 2022-02-27 MED ORDER — POLYETHYLENE GLYCOL 3350 PO PACK 17 GM *I*
17.0000 g | PACK | Freq: Every day | ORAL | Status: DC
Start: 2022-02-27 — End: 2022-03-02
  Administered 2022-02-27 – 2022-02-28 (×2): 17 g via ORAL
  Filled 2022-02-27 (×3): qty 17

## 2022-02-27 MED ORDER — FENTANYL CITRATE 50 MCG/ML IJ SOLN *WRAPPED*
INTRAMUSCULAR | Status: AC
Start: 2022-02-27 — End: 2022-02-27
  Filled 2022-02-27: qty 2

## 2022-02-27 MED ORDER — SOD CITRATE-CITRIC ACID 500-334 MG/5ML PO SOLN *I*
30.0000 mL | Freq: Once | ORAL | Status: DC
Start: 2022-02-27 — End: 2022-02-27

## 2022-02-27 MED ORDER — HYDROMORPHONE HCL PF 1 MG/ML IJ SOLN *WRAPPED*
0.2500 mg | INTRAMUSCULAR | Status: DC | PRN
Start: 2022-02-27 — End: 2022-02-27

## 2022-02-27 MED ORDER — SOD CITRATE-CITRIC ACID 500-334 MG/5ML PO SOLN *I*
ORAL | Status: DC | PRN
Start: 2022-02-27 — End: 2022-02-27
  Administered 2022-02-27: 30 mL via ORAL

## 2022-02-27 MED ORDER — KETOROLAC TROMETHAMINE 30 MG/ML IJ SOLN *I*
30.0000 mg | Freq: Four times a day (QID) | INTRAMUSCULAR | Status: AC
Start: 2022-02-27 — End: 2022-02-28
  Administered 2022-02-27 – 2022-02-28 (×4): 30 mg via INTRAVENOUS
  Filled 2022-02-27 (×4): qty 1

## 2022-02-27 MED ORDER — OXYCODONE HCL 5 MG/5ML PO SOLN *I*
5.0000 mg | Freq: Once | ORAL | Status: DC | PRN
Start: 2022-02-27 — End: 2022-02-27

## 2022-02-27 MED ORDER — MIDAZOLAM HCL 1 MG/ML IJ SOLN *I* WRAPPED
INTRAMUSCULAR | Status: AC
Start: 2022-02-27 — End: 2022-02-27
  Filled 2022-02-27: qty 2

## 2022-02-27 MED ORDER — LACTATED RINGERS IV SOLN *I*
INTRAVENOUS | Status: DC | PRN
Start: 2022-02-27 — End: 2022-02-27

## 2022-02-27 MED ORDER — OXYCODONE HCL 5 MG PO TABS *I*
5.0000 mg | ORAL_TABLET | ORAL | Status: DC | PRN
Start: 2022-02-27 — End: 2022-03-02
  Administered 2022-02-27 – 2022-03-01 (×2): 5 mg via ORAL
  Filled 2022-02-27 (×2): qty 1

## 2022-02-27 MED ORDER — LIDOCAINE HCL 1 % IJ SOLN *I*
INTRAMUSCULAR | Status: DC | PRN
Start: 2022-02-27 — End: 2022-02-27
  Administered 2022-02-27: 3 mL via SUBCUTANEOUS

## 2022-02-27 MED ORDER — MIDAZOLAM HCL 1 MG/ML IJ SOLN *I* WRAPPED
INTRAMUSCULAR | Status: DC | PRN
Start: 2022-02-27 — End: 2022-02-27
  Administered 2022-02-27: 1 mg via INTRAVENOUS

## 2022-02-27 MED ORDER — IBUPROFEN 600 MG PO TABS *I*
600.0000 mg | ORAL_TABLET | Freq: Four times a day (QID) | ORAL | Status: DC | PRN
Start: 2022-02-28 — End: 2022-03-02
  Administered 2022-02-28 – 2022-03-01 (×3): 600 mg via ORAL
  Filled 2022-02-27 (×2): qty 1

## 2022-02-27 NOTE — Discharge Instructions (Signed)
POSTPARTUM CESAREAN DELIVERY INSTRUCTIONS    General Activity:  On the day of discharge, go home and rest. Increase activity such as walking and stair climbing gradually. No strenuous activity or heavy lifting should be done for several weeks. Do not drive for 1-2 weeks after your delivery. It is easy to have an accident when you are tired or not moving as quickly due to pain.  You also should not drive for as long as you are taking narcotic pain medication.  Travel is fine after 2 weeks, but if on a long trip, get out and walk every 2 hours to maintain circulation. Often your feet and ankles will become swollen during the week after delivery. If you had IV fluids, you may swell for several weeks.      We recommend pelvic rest (nothing in the vagina) for at least 2 weeks, until your bleeding stops, or as otherwise instructed by your physician. This means no sexual intercourse, tampons, douching or deep baths.  Sitz bathes are permitted (a few inches of warm water only in the tub).     Emotional Changes: It is normal to experience some mild sadness or emotional swings in the first week or two after delivery. If your symptoms do not get better after 2 weeks or you are experiencing worsening sadness, mood swings, thoughts of hurting yourself or others, including your baby, please call us right away. Postpartum depression is real and treatable, but it is easier to treat the earlier it is detected.    Employment:  You may return to work 6-8 weeks after delivery. This is standard [place].      Incisional Care:  Keep your incision clean and dry.  You should let the warm soapy water fall over your incision in the shower.  You should not scrub the area.  If your incision is under your abdomen, gently lift your abdomen to allow the water into the area.  Pat dry with a towel when you get out of the shower or dry with a hairdryer on COOL only.  If you have Steri-strips (tape bandages) over your incision, they should be  removed after one week if they haven't fallen off on their own.  If they become dirty or wet, they can be removed earlier.  Neosporin and other products/creams should not be applied to the incision until directed by your physician.    If you notice redness, swelling, increased pain, or drainage from the incision, call your provider immediately.  It can sometimes be difficult to see your incision postpartum.  You may need a friend/family member or a mirror to see your incision.  Look at your incision daily at least for the first 2 weeks following your delivery.      Hemorrhoids:  Keep the perineal area clean with baths, showers or Sitz baths. Anesthetic numbing spray or similar ointment is available over the counter and can be applied as needed to stitches. If you have lacerations, they will heal within the next few weeks and the stitches will dissolve on their own. Preparation H, Anusol and Tucks are fine for hemorrhoids.     Breast Care:    If you are breastfeeding, remember that it is a new learning experience for you and your baby. Breastfeeding can come very easily for some new moms and for others it's ok if it needs patience and perseverance. Our lactation consultants have already given you lots of information, and here are a few specific tips to help   you take care of your breasts:   - Wear a comfortable, supportive bra without underwires   - Take good care of yourself so you have lots of time and energy to bond with and feed   your baby. Try to rest as much as possible in between feedings.    - Make sure you have a comfortable, deep latch every time you feed your baby   - If your breasts become engorged, you can gently massage them, take a warm shower   or bath, use warm or cold packs for comfort, or even take some ibuprofen.    - For sore or dry nipples, try to use a little breast milk around your nipple and areola   (pigmented area around breasts) after feeding. Silicone nipple soothing pads or   cool packs  can also help. If your nipples are very sore, contact your healthcare   provider as we may need to help you find a more comfortable latch.    - If you feel stringy full areas in your breasts, you may have some clogged ducts (feel like   stringy clumps). These can be helped by massage and a warm shower or wash   cloth on the breast before you nurse.    For women who are breastfeeding or pumping breastmilk, [facility] is here for help and support when you need it most!    Check out our website: [email or web address]      Call [facility] at [phone] if you need to make an appointment with a provider or lactation consultant specializing in breastfeeding.    Patients are seen in the Breastfeeding Medicine offices at [address]. For patients using [facility] ([facility]) for their pediatrician, services are also available in the Ambulatory building at [place] (6th floor).       For parents who are breastfeeding or pumping and want to meet up with other parents for support and help, come to the [facility]!   FREE drop-in support   No appointment needed!    Share and connect with other breastfeeding women   Get support from lactation specialists:  [facility] @[facility] - [address]--  on the 1st and 3rd Wednesday of every month from 1-3 pm.   [facility] @[facility]--[address]--  on 2nd Tuesday of every month from 10:30am -12 noon  [facility]  [address], [place]--Wednesdays from 10-11:30 am  More info and locations at [email or web address]       Breast infections can occur whether you are nursing or not. Symptoms include fever, chills, and painful areas on your breast. Usually antibiotics are needed, so call us during the day with your pharmacy number. Gentle massage can help as well. If you are nursing, keep nursing while you  contact your provider. If you need to use pain medications other than what we list in these instructions, check with your OB provider or pediatrician's office to determine if they are safe for breastfeeding.    If you are feeding formula only, a snug bra can help to prevent pain and engorgement. If you do get pain or swelling, you can apply ice packs to your breasts and take Ibuprofen. You may need to do this for several days. We do not use medication of any kind to "dry up" your breast milk.    Abdominal Cramps:  "After birth pains" can   be severe, especially with nursing and after the birth of the second or more child. They rarely last more than 4 days.    Pain Medication: Ibuprofen or acetaminophen are both safe to take while nursing and are usually effective at managing cramps.  Narcotic pain medication (Oxycodone, Percocet, Dilaudid) may be needed for pain not relieved with over-the-counter medications.  If you are taking a narcotic with Acetaminophen in it, do not take any additional Tylenol.  Narcotic pain medication can cause lightheadedness/dizziness, nausea/vomiting, confusion, sleepiness, and constipation.  Avoid taking these medications when home alone with the infant until you know how you will respond to them.  Do not drive when taking these medications.  You should take stool softners (Colace, Miralax) while taking these medications.    Vitamins and Iron:  Continue the vitamins throughout nursing or for 1 month if not nursing. Continue iron for 1 month.    Bathing:  Showers and shallow baths are fine. No douching. Deep baths are also not recommended.    Constipation:  Stool softeners such as Colace, Metamucil, Senokot, Miralax, or generic are fine to use for mild constipation. Use milk of magnesia as necessary for persistent constipation.       Menstruation:  Bleeding is expected for several weeks. It may be red, blackish with clots. It changes to pink and then yellow-gray.  Frequently, the discharge  becomes red again. If the bleeding gets heavy again, you need to rest more.     The return of a real menstrual period varies. For non-nursing mothers, it is usually between 4-10 weeks. For nursing mothers, it can take over a year.  Several months of periods are usually required before cycles become regular. Bleeding may increase after you go home, due to increased activities.     Sexual Intercourse:  It takes varying times for pain from vaginal tears/episiotomies, hemorrhoids and swelling to go away so that you are comfortable. We recommend not resuming intercourse for at least 2 weeks, until your bleeding stops, or as otherwise instructed by your physician.   Nursing causes vaginal dryness and lubrication is often required. Remember, the possibility of becoming pregnant exists, even while nursing. You ovulate 2 weeks before the return of the first period, so you may become pregnant before your first period.      Contraception:  We recommend at least 18 months between pregnancies for you and your baby's health. Even if you are breastfeeding, you can become pregnant if you are not using a reliable and consistent form of contraception. We do not recommend using estrogen-containing products for the first several weeks after delivery due to the elevated risk of blood clots or while breastfeeding. Progesterone-only products, however, are perfectly safe immediately postpartum and do not interfere with breastfeeding.     Postpartum Visit:  Call our office to schedule an appointment. This visit will be scheduled 6 weeks after you delivered.  Your physician may ask you to schedule an incision check in the office one week following delivery.  This will be specified on your discharge paperwork.      Call if the following occur:   1. Fever of 100.4 degrees F or severe chills. Check your temperature before calling, if possible. You may feel hot but you cannot determine if you have a true fever without a thermometer.    2.  Excessively heavy vaginal bleeding.   3. Redness, swelling, or drainage from your incision.   4. Extreme urinary frequency and burning with urination.     5. Swelling or tenderness in one area of the breast.   6. Marked depression or anxiety.    7. Severe pain not improved with pain medication.   8. Chest pain or shortness or breath.   9. Significant swelling in one leg more than the other.   10. Persistent headache or visual changes.   11. Any other questions or concerns.        Please refer to your [facility] packet for any other questions not addressed here.

## 2022-02-27 NOTE — Anesthesia Postprocedure Evaluation (Signed)
Anesthesia Post-Op Note    Patient: [name] Mroczka    Procedure(s) Performed:  Procedure Summary  Date:  02/27/2022 Anesthesia Start: 02/27/2022  6:10 PM Anesthesia Stop: 02/27/2022  7:45 PM Room / Location:  [id number] / [facility] L&D   Procedure(s):  CESAREAN SECTION Diagnosis:  h/o csection Surgeon(s):  Merilynn Finland, MD  Emilio Aspen, MD  Glennie Isle, MD MPH Responsible Anesthesia Provider:  Darcus Pester, MD         Recovery Vitals  BP: 106/60 (02/27/2022 10:21 PM)  Heart Rate: 62 (02/27/2022 10:21 PM)  Resp: 24 (02/27/2022 10:21 PM)  Temp: 36.4 C (97.5 F) (02/27/2022 10:21 PM)  SpO2: 97 % (02/27/2022 10:21 PM)   0-10 Scale: 10 (02/27/2022 10:21 PM)    Anesthesia type:  spinal  Complications Noted During Procedure or in PACU:  None   Comment:    Patient Location:  PACU  Level of Consciousness:    Recovered to baseline  Patient Participation:     Able to participate  Temperature Status:    Normothermic  Oxygen Saturation:    Within patient's normal range  Cardiac Status:   Within patient's normal range  Fluid Status:    Stable  Airway Patency:     Yes  Pulmonary Status:    Baseline  Neuraxial Block Evaluation:    Block resolving  Pain Management:    Adequate analgesia  Post Op Assessment:    Tolerated procedure well  Responsible Anesthesia Provider Attestation:  All indicated post anesthesia care provided       -

## 2022-02-27 NOTE — Procedures (Addendum)
Fetal Non-Stress Test    Patient: [name] Couser Age: 31 y.o.  Date of Birth: [birth year]  HER:DEYCXK  eMRN: [id number]  GA: [redacted]w[redacted]d  Maternal  Heart Rate: 89 (02/27/22 1239)  BP: 108/63 (02/27/22 1239)    Reason For Exam: pre op C/S  Education Done:  NST      Fetal non-stress test for singleton pregnancy  Pre Procedure Diagnosis: Supervision of high risk pregnancy  Post Procedure Diagnosis: NST (non-stress test) reactive            NST Start Time:  02/27/2022 1:04 PM            Uterine Irritability: No            Contractions: irregular    NST Fetus A  Variability: moderate  FHR Baseline: 120  Decelerations: none   Accelerations: at least 15 BPM for 15 seconds  Stimulation: none  NST Interpretation: reactive    End Time:  02/27/2022 1:28 PM          Duration of test (min): 24  Location of NST Fetal Heart Tracing: Archived electronically        Test performed By: Chip Boer, RN  02/27/2022 1:27 PM

## 2022-02-27 NOTE — Progress Notes (Signed)
UPDATES TO PATIENT'S CONDITION on the DAY OF SURGERY/PROCEDURE    I. Updates to Patient's Condition (to be completed by a provider privileged to complete a H&P, following reassessment of the patient by the provider):    Day of Surgery/Procedure Update:  History  History reviewed and no change    Physical  Physical exam updated and no change            II. Procedure Readiness   I have reviewed the patient's H&P and updated condition. By completing and signing this form, I attest that this patient is ready for surgery/procedure.    III. Attestation   I have reviewed the updated information regarding the patient's condition and it is appropriate to proceed with the planned surgery/procedure.    I have reminded Ms. Blok that COVID-19 is still present in our community. She was advised that [facility] and its affiliates have made deliberate and widespread changes to policies and procedures, consistent with applicable directives, in order to reduce the risk of exposure in our facilities.     I further explained and Ms. Mchaney understands that given the communicability of the SARS CoV2 coronavirus, there remains a small but real risk of contracting the disease while receiving perioperative care - even with stringent preventive measures in place.   Ms. Mcclees understands the potential consequences of COVID-19 disease as it relates to their planned procedure and anticipated postoperative course.      The patient and I have considered and discussed the relative risks and benefits of proceeding with his/her surgery - both in terms of the procedure itself, and also in the context of the ongoing pandemic.  Ms. Reder wishes to proceed with the procedure.     Dessie Coma, DO as of 3:19 PM 02/27/2022

## 2022-02-27 NOTE — Anesthesia Procedure Notes (Signed)
---------------------------------------------------------------------------------------------------------------------------------------    AIRWAY   GENERAL INFORMATION AND STAFF       Date of Procedure: 02/27/2022 7:13 PM  CONDITION PRIOR TO MANIPULATION     Current Airway/Neck Condition:  Normal        For more airway physical exam details, see Anesthesia PreOp Evaluation  AIRWAY METHOD     Patient Position:  Sniffing    Preoxygenated: no      Number of Attempts at Approach:  1    Number of Other Approaches Attempted:  0  FINAL AIRWAY DETAILS    Final Airway Type:  None (no supplemental oxygen/airway)    Head position required to avoid obstruction:  Neutral  ----------------------------------------------------------------------------------------------------------------------------------------

## 2022-02-27 NOTE — Anesthesia Procedure Notes (Addendum)
---------------------------------------------------------------------------------------------------------------------------------------    NEURAXIAL BLOCK PLACEMENT  Single Shot Spinal    Date of Procedure: 02/27/2022 6:24 PM    Patient Location:  OR    Reason for Block: post-op pain management and intraoperative anesthetic    CONSENT AND TIMEOUT     Consent:  Obtained per policy    Timeout: patient identified (name/DOB) , proper patient position verified, needed equipment, monitors, medications and access verified as present and functioning , allergies reviewed with patient/record , all members of the block team participated in the timeout and anticoagulation/antiplatelet status reviewed  METHOD:    Patient Position: sitting    Monitoring: blood pressure and continuous pulse oximetry      Labeling: syringes appropriately labeled    Sedation Used: not needed            For medications used, please see MAR        Level of Sedation: none      Sterile Technique:  Hand sanitizing, hat, mask and sterile gloves    Prep: aseptic technique per protocol, povidone-iodine and site draped      Successful Approach: midline    Successful Location: L3-4    Attempts (Skin Punctures):  1  SPINAL NEEDLE:      Introducer: 20 G    Type: Pencan    Gauge: 25 G    Length: 3.5 in    CSF Appearance: clear  OBSERVATIONS:    Block Completion:  Successfully completed      Paresthesia: none      Neuraxial Blood: blood not aspirated      Sensory Levels: bilateral      Peak Sensory Level: T4        Motor Block: dense      Patient Reaction to Block: tolerated procedure well, vitals remained stable, fetal stability and pain relief    STAFF     Performed by Hipp, Hal Neer, MD      Attest: I was present for the entire procedure   Anael Rosch, Noralee Space, MD  ----------------------------------------------------------------------------------------------------------------------------------------
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
## 2022-02-27 NOTE — Progress Notes (Signed)
Social Work Note  Alexis Vang is admitted for a scheduled c/s.  Pt is known to social work staff form her limited visits in [facility] and multiple Triage visits.    SW will be contacting CPS after delivery due to the following concerns that would impede on her ability to provide care to her baby  *Psychiatric illness with many ED and CPEP visits, psychiatric admissions for psychosis at [facility] and hospitals in [place] and [place].   *SUD- cocaine use during pregnancy, most recent [facility] was on 01/21/22 which was + for cocaine.    *Unstable housing and homelessness  *Pt is currently at [facility], staff have expressed concern about pt's ability to care for a baby.  Plan:  Per discussion with team, Ms. Kopke likely will not be sectioned until approx 5pm.  SW will contact CPS on 02/28/22.              SW and PNP Marni Griffon met with Alexis Vang and explained that her baby will need to remain in the [facility] for the first 24 hours after her c/s (she does not have a support person present) and that she will need to visit her baby in that location.              Pt was informed by PNP Marni Griffon that she can not breast feed her baby until she is evaluated by Breast Feeding Medicine.               Jerline Pain, [place] [phone]

## 2022-02-27 NOTE — Anesthesia Case Conclusion (Signed)
CASE CONCLUSION  Emergence  Transport  Directly to: PACU  Position:  Recumbent  Patient Condition on Handoff  Level of Consciousness:  Alert/talking/calm  Patient Condition:  Stable  Handoff Report to:  RN

## 2022-02-27 NOTE — Progress Notes (Signed)
Patient arrived to 316 for scheduled c/s this afternoon.  Oriented to room, unit, call bell and staff.  Questions answered.  Team to be made aware of patients arrival.
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
## 2022-02-27 NOTE — Plan of Care (Signed)
Problem: C-Section Postpartum Care  Goal: Vital signs are medically acceptable  Outcome: Maintaining  Goal: Respiratory - Lungs clear to auscultation  Outcome: Maintaining  Goal: Cardiovascular - Homan's sign negative  Outcome: Maintaining  Goal: Patient will remain free of falls  Outcome: Maintaining  Goal: Dressing intact until removed with any drainage marked  Outcome: Maintaining  Goal: Fundus firm at midline  Outcome: Maintaining  Goal: Moderate rubra freeflow, no purulent discharge, no foul smelling lochia  Outcome: Maintaining  Goal: Urine output is 30 ml/hour or more  Description: Urinary Catheter is draining yellow urine 30 ML/hour or more.  Outcome: Maintaining  Goal: Breasts are soft with nipple integrity intact  Outcome: Maintaining  Goal: Demonstrates appropriate breast feeding techniques  Outcome: Maintaining  Goal: Patient is able to void/empty bladder after catheter is removed  Outcome: Maintaining  Goal: Positive Mother-Baby interactions are observed  Outcome: Maintaining  Goal: Ambulates independently  Outcome: Maintaining     Problem: Pain  Goal: Patient's pain/discomfort is manageable  Outcome: Maintaining  Goal: Report decrease in pain level  Description: Alleviation of pain or a reduction in pain to a level of comfort that is acceptable to the patient.  Outcome: Maintaining     Problem: Knowledge Deficit  Goal: Patient/S.O. demonstrates understanding of disease process, treatment plan, medications, and discharge instructions.  Outcome: Maintaining     Problem: Safety - OB  Goal: Follow unit's safety guidelines  Outcome: Maintaining

## 2022-02-28 LAB — CBC
Hematocrit: 29 % — ABNORMAL LOW (ref 34–45)
Hemoglobin: 9.3 g/dL — ABNORMAL LOW (ref 11.2–15.7)
MCH: 31 pg (ref 26–32)
MCHC: 32 g/dL (ref 32–36)
MCV: 95 fL (ref 79–95)
Platelets: 225 10*3/uL (ref 160–370)
RBC: 3 MIL/uL — ABNORMAL LOW (ref 3.9–5.2)
RDW: 12.7 % (ref 11.7–14.4)
WBC: 8.1 10*3/uL (ref 4.0–10.0)

## 2022-02-28 LAB — SYPHILIS SCREEN
Syphilis Screen: NEGATIVE
Syphilis Status: NONREACTIVE

## 2022-02-28 MED ORDER — LACTATED RINGERS IV BOLUS *I*
1000.0000 mL | Freq: Once | INTRAVENOUS | Status: AC
Start: 2022-02-28 — End: 2022-03-01

## 2022-02-28 MED ORDER — NALBUPHINE HCL 10 MG/ML IJ SOLN *I*
3.0000 mg | Freq: Once | INTRAMUSCULAR | Status: AC
Start: 2022-02-28 — End: 2022-02-28
  Administered 2022-02-28: 3 mg via INTRAVENOUS
  Filled 2022-02-28: qty 1

## 2022-02-28 MED ORDER — COVID-19 MRNA BIVALENT VACCINE (PFIZER-BIONTECH) 30 MCG/0.3ML IM SUSP *I*
0.3000 mL | Freq: Once | INTRAMUSCULAR | Status: AC
Start: 2022-02-28 — End: 2022-03-01
  Filled 2022-02-28: qty 0.3

## 2022-02-28 MED ORDER — HYDROXYZINE HCL 50 MG PO TABS *I*
50.0000 mg | ORAL_TABLET | Freq: Four times a day (QID) | ORAL | Status: DC | PRN
Start: 2022-02-28 — End: 2022-03-02
  Administered 2022-02-28 – 2022-03-01 (×4): 50 mg via ORAL
  Filled 2022-02-28 (×4): qty 1

## 2022-02-28 MED ORDER — TRIAMCINOLONE ACETONIDE 0.1 % EX CREA *I*
TOPICAL_CREAM | Freq: Two times a day (BID) | CUTANEOUS | Status: DC
Start: 2022-02-28 — End: 2022-03-02
  Filled 2022-02-28 (×2): qty 15

## 2022-02-28 NOTE — Anesthesia Postprocedure Evaluation (Signed)
Anesthesia Post-Op Note    Patient: Alexis Vang    Procedure(s) Performed:  Procedure Summary  Date:  02/27/2022 Anesthesia Start: 02/27/2022  6:10 PM Anesthesia Stop: 02/27/2022  7:45 PM Room / Location:  [id number] / [facility] L&D   Procedure(s):  CESAREAN SECTION Diagnosis:  h/o csection Surgeon(s):  Merilynn Finland, MD  Emilio Aspen, MD  Glennie Isle, MD MPH Responsible Anesthesia Provider:  Darcus Pester, MD         Recovery Vitals  BP: 125/75 (02/28/2022  1:40 PM)  Heart Rate: 91 (02/28/2022  1:40 PM)  Resp: 16 (02/28/2022  1:40 PM)  Temp: 36.6 C (97.9 F) (02/28/2022  1:40 PM)  SpO2: 100 % (02/28/2022  1:40 PM)   0-10 Scale: 10 (02/28/2022  1:40 PM)    Anesthesia type:  spinal  Complications Noted During Procedure or in PACU:  None   Comment:    Patient Location:  Patient location: not in room.  Level of Consciousness:    Recovered to baseline (per RN)  Patient Participation:     Able to participate  Temperature Status:    Normothermic  Oxygen Saturation:    Within patient's normal range  Cardiac Status:   Within patient's normal range  Fluid Status:    Stable  Airway Patency:     Yes  Pulmonary Status:    Baseline  Neuraxial Block Evaluation:    No residual motor or sensory symptoms (per RN)  Post Op Assessment:    Tolerated procedure well  Responsible Anesthesia Provider Attestation:  All indicated post anesthesia care provided       -
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
## 2022-02-28 NOTE — Lactation Note (Signed)
This note was copied from a baby's chart.  Lactation Consultant Daily Visit   Patient: Boy Alderfer            AGE: 31 days              Corrected GA: 39w 1d  MRN: [id number]     Approached mother to provide ABM protocol and provide additional information. Mother informed LC she wishes to breastfeed. Reinforced that per her conversation with provider, breastfeeding and/or providing breast milk is not safe at this time (see H&P note from Marena Chancy, MD). Mother replied "at least they could give him formula. He has not eaten since I was pregnant." Offered to provide additional information about pumping and dumping milk if she desires to establish a supply. Mother stated "I am not going to waste it" and then changed the subject to discuss what was happening on baby's monitor. Redirected mother several times during conversation back to lactation. Reinforced that lactation is available for additional questions about establishing a milk supply or managing engorgement. Unable to confirm mother's understanding of recommendations per ABM protocol as below:    Per Academy of Breastfeeding Medicine, women with substance abuse may be encouraged to breastfeed if they:   1. Are engaged in substance abuse treatment, and willing to give consent for provider discussion with their treatment counselor.   2. Plans to continue treatment in the postpartum period  3. Have abstained from drug use for 90 days prior to delivery and are able to maintain sobriety in an outpatient setting  4. Utox negative at delivery  5. Engaged in prenatal care and compliant.
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
## 2022-02-28 NOTE — Addendum Note (Signed)
Addendum  created 02/28/22 1535 by Joaquim Lai, MD    Clinical Note Signed

## 2022-02-28 NOTE — Progress Notes (Addendum)
,  Writer reviewed chart for care coordination.  Patient is seen by [facility] for OB/GYN care.    Per Social Work Notes, patient can return back to [facility] when she is ready to discharge.   Patient delivered baby boy via C Section on 02/27/2022.   Attempted to speak to patient x 2 but patient was sleeping or off unit and not answering phone.  Patient states that preferred pharmacy for discharge medications to be sent to [place]. Pharmacy will deliver discharge medications to patient's room prior to discharge.  Patient's access to My Chart is pending.  Will continue to follow and coordinate discharge planning needs.     The following follow up appointments are scheduled:   03/06/2022 @ 1:00 pm [facility] with MD for week incision/bp check.    04/10/2022 @ 1:00 pm [facility] with MD for 6 week post partum check.      [facility] will continue to follow and coordinate discharge planning needs.  Please contact me with any questions or concerns. Thank you!    Perlie Gold, RN BSN  Acute Care Coordinator [phone] High Risk OB and [id number] High Risk Labor/Delivery  [phone]    Pager [phone]  Weekend coverage pager [phone]

## 2022-03-01 ENCOUNTER — Other Ambulatory Visit: Payer: Self-pay

## 2022-03-01 MED ORDER — COVID-19 MRNA BIVALENT VACCINE (PFIZER-BIONTECH) 30 MCG/0.3ML IM SUSP *I*
0.3000 mL | Freq: Once | INTRAMUSCULAR | Status: AC
Start: 2022-03-01 — End: 2022-03-01
  Administered 2022-03-01: 0.3 mL via INTRAMUSCULAR

## 2022-03-01 MED ORDER — MENTHOL THROAT LOZENGE *I*
1.0000 | LOZENGE | OROMUCOSAL | Status: DC | PRN
Start: 2022-03-01 — End: 2022-03-02
  Administered 2022-03-01: 1 via BUCCAL
  Filled 2022-03-01: qty 1

## 2022-03-01 MED ORDER — ACETAMINOPHEN 325 MG PO TABS *I*
1000.0000 mg | ORAL_TABLET | Freq: Four times a day (QID) | ORAL | 0 refills | Status: DC | PRN
Start: 2022-03-01 — End: 2022-03-03
  Filled 2022-03-01: qty 100, 9d supply, fill #0

## 2022-03-01 MED ORDER — IBUPROFEN 600 MG PO TABS *I*
600.0000 mg | ORAL_TABLET | Freq: Four times a day (QID) | ORAL | 0 refills | Status: DC | PRN
Start: 2022-03-01 — End: 2022-03-03
  Filled 2022-03-01: qty 50, 13d supply, fill #0

## 2022-03-01 MED ORDER — ACETAMINOPHEN 325 MG PO TABS *I*
650.0000 mg | ORAL_TABLET | ORAL | 0 refills | Status: DC | PRN
Start: 2022-03-01 — End: 2022-03-01
  Filled 2022-03-01: qty 100, 9d supply, fill #0

## 2022-03-01 MED ORDER — OXYCODONE HCL 5 MG PO TABS *I*
5.0000 mg | ORAL_TABLET | ORAL | 0 refills | Status: DC | PRN
Start: 2022-03-01 — End: 2022-04-12
  Filled 2022-03-01: qty 20, 4d supply, fill #0

## 2022-03-01 MED ORDER — POLYETHYLENE GLYCOL 3350 PO POWD *I*
17.0000 g | Freq: Every day | ORAL | 0 refills | Status: DC
Start: 2022-03-02 — End: 2022-04-12
  Filled 2022-03-01: qty 510, 30d supply, fill #0

## 2022-03-01 MED ORDER — INFLUENZA VAC SPLIT QUAD (FLULAVAL) 0.5 ML IM SUSY PF(>=6 MONTHS) *I*
0.5000 mL | PREFILLED_SYRINGE | INTRAMUSCULAR | Status: AC
Start: 2022-03-01 — End: 2022-03-01
  Administered 2022-03-01: 0.5 mL via INTRAMUSCULAR
  Filled 2022-03-01: qty 0.5

## 2022-03-01 NOTE — NoShare Progress Note (Addendum)
Social Work Note    Contacts: Social worker    Information: SW updated that pt Alexis Vang is medically ready for discharge today. SW spoke with [facility] who stated they can receive her back today but cannot provide transportation. [employer] stated Alexis can return via [place].    Estill Bamberg RN to update providers. SW available to schedule [facility] when discharge paperwork is in.    Pt Alexis Vang to remain admitted for 96h in house monitoring and until CPS determines a safe discharge plan.    Angelina Sheriff, LMSW     Addendum: SW met with Alexis on [address]. SW updated Alexis that OB has approved her to stay one more night on [address]. Alexis asking where she can go when discharged tomorrow. SW stated that she can either stay with pt on 334 or return to [facility]. Alexis stated "no one listens to my feelings that I want to stay here." SW attempted to clarify that mother and pt are separate patients, so Alexis will need to be discharged when she is medically ready even though Alexis Vang will need to remain admitted. Alexis remain very confused seeming, although ultimately stated her plan is to stay with Alexis Vang until discharge.              SW updated Krystal Eaton, RN, 334 charge nurse. Sign off completed to NICU and OB social work for [employer].    Angelina Sheriff, [place]
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
## 2022-03-01 NOTE — Progress Notes (Signed)
Patient discharged home in stable condition. Instructions reviewed and questions answered.
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
## 2022-03-02 ENCOUNTER — Observation Stay
Admission: RE | Admit: 2022-03-02 | Discharge: 2022-03-03 | Disposition: A | Discharge status: Home / Self Care | Payer: Medicaid Other | Source: Ambulatory Visit | Attending: Obstetrics & Gynecology | Admitting: Obstetrics & Gynecology

## 2022-03-02 ENCOUNTER — Observation Stay: Payer: Medicaid Other

## 2022-03-02 ENCOUNTER — Ambulatory Visit: Payer: Self-pay

## 2022-03-02 DIAGNOSIS — R10814 Left lower quadrant abdominal tenderness: Secondary | ICD-10-CM | POA: Insufficient documentation

## 2022-03-02 DIAGNOSIS — Z98891 History of uterine scar from previous surgery: Secondary | ICD-10-CM | POA: Insufficient documentation

## 2022-03-02 DIAGNOSIS — O9089 Other complications of the puerperium, not elsewhere classified: Principal | ICD-10-CM | POA: Insufficient documentation

## 2022-03-02 LAB — COMPREHENSIVE METABOLIC PANEL
ALT: 13 U/L (ref 0–35)
AST: 30 U/L (ref 0–35)
Albumin: 2.8 g/dL — ABNORMAL LOW (ref 3.5–5.2)
Alk Phos: 116 U/L — ABNORMAL HIGH (ref 35–105)
Anion Gap: 8 (ref 7–16)
Bilirubin,Total: 0.5 mg/dL (ref 0.0–1.2)
CO2: 23 mmol/L (ref 20–28)
Calcium: 8.2 mg/dL — ABNORMAL LOW (ref 8.8–10.2)
Chloride: 111 mmol/L — ABNORMAL HIGH (ref 96–108)
Creatinine: 0.58 mg/dL (ref 0.51–0.95)
Glucose: 84 mg/dL (ref 60–99)
Lab: 13 mg/dL (ref 6–20)
Potassium: 3.8 mmol/L (ref 3.3–5.1)
Sodium: 142 mmol/L (ref 133–145)
Total Protein: 5.1 g/dL — ABNORMAL LOW (ref 6.3–7.7)
eGFR BY CREAT: 124 *

## 2022-03-02 LAB — CBC AND DIFFERENTIAL
Baso # K/uL: 0 10*3/uL (ref 0.0–0.1)
Basophil %: 0.4 %
Eos # K/uL: 0.3 10*3/uL (ref 0.0–0.4)
Eosinophil %: 6.4 %
Hematocrit: 25 % — ABNORMAL LOW (ref 34–45)
Hemoglobin: 7.9 g/dL — ABNORMAL LOW (ref 11.2–15.7)
IMM Granulocytes #: 0 10*3/uL (ref 0.0–0.0)
IMM Granulocytes: 0.7 %
Lymph # K/uL: 1.5 10*3/uL (ref 1.2–3.7)
Lymphocyte %: 27.3 %
MCH: 31 pg (ref 26–32)
MCHC: 32 g/dL (ref 32–36)
MCV: 96 fL — ABNORMAL HIGH (ref 79–95)
Mono # K/uL: 0.5 10*3/uL (ref 0.2–0.9)
Monocyte %: 9.7 %
Neut # K/uL: 3 10*3/uL (ref 1.6–6.1)
Nucl RBC # K/uL: 0 10*3/uL (ref 0.0–0.0)
Nucl RBC %: 0 /100 WBC (ref 0.0–0.2)
Platelets: 269 10*3/uL (ref 160–370)
RBC: 2.6 MIL/uL — ABNORMAL LOW (ref 3.9–5.2)
RDW: 13.2 % (ref 11.7–14.4)
Seg Neut %: 55.5 %
WBC: 5.4 10*3/uL (ref 4.0–10.0)

## 2022-03-02 MED ORDER — OXYCODONE HCL 5 MG PO TABS *I*
5.0000 mg | ORAL_TABLET | Freq: Once | ORAL | Status: AC
Start: 2022-03-02 — End: 2022-03-02
  Administered 2022-03-02: 5 mg via ORAL
  Filled 2022-03-02: qty 1

## 2022-03-02 MED ORDER — ACETAMINOPHEN 500 MG PO TABS *I*
1000.0000 mg | ORAL_TABLET | Freq: Once | ORAL | Status: AC
Start: 2022-03-02 — End: 2022-03-02
  Administered 2022-03-02: 1000 mg via ORAL
  Filled 2022-03-02: qty 2

## 2022-03-02 MED ORDER — IBUPROFEN 600 MG PO TABS *I*
600.0000 mg | ORAL_TABLET | Freq: Once | ORAL | Status: AC
Start: 2022-03-02 — End: 2022-03-02
  Administered 2022-03-02: 600 mg via ORAL
  Filled 2022-03-02: qty 1
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
## 2022-03-02 NOTE — Lactation Note (Signed)
This note was copied from a baby's chart.  Engorgement Management:   Whether or not you plan to feed your baby breast milk, your breasts began making milk while you were pregnant.  About 2-5 days after you deliver your baby, your breasts may naturally start to make more milk. Your breasts may feel heavy, warm, swollen, and may become painfully engorged. This will gradually go away if your breasts are not stimulated to make more milk.    If you are not planning to breastfeed or pump, and you want to stop you breast milk production, you can use one or more of these steps to relieve discomfort:  • Apply cold compresses (ice packs over a layer of cloth), 10-15 minutes on, 10-15 minutes off; repeat as needed.  • Express a small amount of milk, using gentle massage or hand expression. Do this only as needed for comfort and apply ice again immediately after expressing any milk.   • Take Ibuprofen unless you have been told not to by your doctor or medical provider. Read and follow all instructions on the label.   • Wear a well-fitting supportive bra that does not have wires.  Do not bind or put a lot of pressure on your breasts. Binding can lead to problems like mastitis, plugged milk ducts, and more discomfort. You should feel better in about 3-5 days.    Severe engorgement can lead to blocked milk ducts and breast infection, called mastitis. Mastitis needs to be treated with antibiotics. Warning signs that you may need to have a doctor or other health professional assess your breasts:  • Fever  • Flu-like Symptoms  • Any redness including streaks and/or redness associated with firm lumps  • Continued pain after the above suggestions    You should feel better in about 3-5 days. If you have any questions or concerns call your doctor, a lactation consultant, or the [facility] at [phone] ([phone]).

## 2022-03-02 NOTE — OB Triage Note (Signed)
Pt arrived in triage with complaints of incisional pain. States the left side of her incision is excruciating and has been since the surgery. Pt states she is taking motrin/tylenol and oxy but has not been taking it around the clock, only when it gets unbearable. Incision is CD&I, no redness or swelling. MD made aware.

## 2022-03-03 ENCOUNTER — Observation Stay: Payer: Medicaid Other | Admitting: Radiology

## 2022-03-03 ENCOUNTER — Other Ambulatory Visit: Payer: Self-pay

## 2022-03-03 ENCOUNTER — Ambulatory Visit: Payer: Self-pay

## 2022-03-03 DIAGNOSIS — R109 Unspecified abdominal pain: Secondary | ICD-10-CM

## 2022-03-03 MED ORDER — IBUPROFEN 600 MG PO TABS *I*
600.0000 mg | ORAL_TABLET | Freq: Three times a day (TID) | ORAL | 1 refills | Status: AC | PRN
Start: 2022-03-03 — End: 2022-04-02
  Filled 2022-03-03: qty 40, 14d supply, fill #0

## 2022-03-03 MED ORDER — ACETAMINOPHEN 325 MG PO TABS *I*
650.0000 mg | ORAL_TABLET | Freq: Four times a day (QID) | ORAL | 0 refills | Status: AC | PRN
Start: 2022-03-03 — End: 2022-04-02
  Filled 2022-03-03: qty 100, 13d supply, fill #0

## 2022-03-03 MED ORDER — FERROUS SULFATE 325 (65 FE) MG PO TABS *WRAPPED* *I*
325.0000 mg | ORAL_TABLET | Freq: Every day | ORAL | 0 refills | Status: AC
Start: 2022-03-03 — End: 2022-04-02
  Filled 2022-03-03: qty 30, 30d supply, fill #0

## 2022-03-03 NOTE — Lactation Note (Signed)
This note was copied from a baby's chart.  Lactation Consultant Daily Visit   Patient: Alexis Vang            AGE: 31 days              Corrected GA: 39w 4d  MRN: [id number]     Mother's breast milk not safe to provide to infant at this time.     Interventions and Instructions   LC paged to bedside, RN requesting hand pump be given to mother due to engorgement.     Hand pump provided, but discussed with family member and mother correct engorgement management protocol:    Engorgement Management:   About 2-5 days after you deliver your baby, your breasts may naturally start to make more milk. Your breasts may feel heavy, warm, swollen, and may become painfully engorged. This will gradually go away if your breasts are not stimulated to make more milk.    If you are not planning to breastfeed or pump, and you want to stop you breast milk production, you can use one or more of these steps to relieve discomfort:   Apply cold compresses (ice packs over a layer of cloth), 10-15 minutes on, 10-15 minutes off; repeat as needed.   Express a small amount of milk, using gentle massage or hand expression. Do this only as needed for comfort and apply ice again immediately after expressing any milk.    Take Ibuprofen unless you have been told not to by your doctor or medical provider. Read and follow all instructions on the label.    Wear a well-fitting supportive bra that does not have wires.  Do not bind or put a lot of pressure on your breasts. Binding can lead to problems like mastitis, plugged milk ducts, and more discomfort. You should feel better in about 3-5 days.    Severe engorgement can lead to blocked milk ducts and breast infection, called mastitis. Mastitis needs to be treated with antibiotics. Warning signs that you may need to have a doctor or other health professional assess your breasts:   Fever   Flu-like Symptoms   Any redness including streaks and/or redness associated with firm lumps   Continued pain after  the above suggestions    You should feel better in about 3-5 days. If you have any questions or concerns call your doctor, a lactation consultant, or the [facility] at [phone]).    Reviewed: Engorgement management  Plan   Follow-up with lactation daily while mother/infant dyad inpatient or by maternal request.  Length of this call/visit: 10-15 minutes     Clinton Gallant, RN, [facility]  Lactation Consultant

## 2022-03-03 NOTE — Discharge Instructions (Signed)
[place]   [place] Triage Discharge Instructions    You were seen in triage for:  Abdominal pain after Cesarean section    Diagnosis:   Normal post operative pain    You are being discharged to: Facility or NICU if you care continuing to stay there  The following tests are still pending: None    You may resume your normal activity and diet.    You should follow up with your primary obstetric provider.       Call your primary obstetric provider or return to triage if you experience the following:  1. Worsening of your symptoms  2. Vaginal bleeding  3. Severe headache, chest pain, difficulty breathing  4. Severe abdominal pain, especially if on the right side      If you cannot reach your obstetric provider, call 911 if it is an emergency.

## 2022-03-04 ENCOUNTER — Other Ambulatory Visit: Payer: Self-pay

## 2022-03-04 ENCOUNTER — Observation Stay: Payer: Medicaid Other | Admitting: Radiology

## 2022-03-04 ENCOUNTER — Observation Stay
Admission: EM | Admit: 2022-03-04 | Discharge: 2022-03-04 | Disposition: A | Discharge status: Home / Self Care | Payer: Medicaid Other | Source: Ambulatory Visit | Attending: Certified Nurse Midwife | Admitting: Certified Nurse Midwife

## 2022-03-04 DIAGNOSIS — R109 Unspecified abdominal pain: Secondary | ICD-10-CM | POA: Insufficient documentation

## 2022-03-04 DIAGNOSIS — O9081 Anemia of the puerperium: Secondary | ICD-10-CM | POA: Insufficient documentation

## 2022-03-04 DIAGNOSIS — R2242 Localized swelling, mass and lump, left lower limb: Secondary | ICD-10-CM

## 2022-03-04 DIAGNOSIS — O1205 Gestational edema, complicating the puerperium: Principal | ICD-10-CM | POA: Insufficient documentation

## 2022-03-04 DIAGNOSIS — R7401 Elevation of levels of liver transaminase levels: Secondary | ICD-10-CM | POA: Insufficient documentation

## 2022-03-04 DIAGNOSIS — F4481 Dissociative identity disorder: Secondary | ICD-10-CM | POA: Insufficient documentation

## 2022-03-04 DIAGNOSIS — Z7189 Other specified counseling: Secondary | ICD-10-CM

## 2022-03-04 DIAGNOSIS — D649 Anemia, unspecified: Secondary | ICD-10-CM | POA: Insufficient documentation

## 2022-03-04 DIAGNOSIS — F4310 Post-traumatic stress disorder, unspecified: Secondary | ICD-10-CM | POA: Insufficient documentation

## 2022-03-04 LAB — COMPREHENSIVE METABOLIC PANEL
ALT: 18 U/L (ref 0–35)
AST: 39 U/L — ABNORMAL HIGH (ref 0–35)
Albumin: 3.1 g/dL — ABNORMAL LOW (ref 3.5–5.2)
Alk Phos: 142 U/L — ABNORMAL HIGH (ref 35–105)
Anion Gap: 12 (ref 7–16)
Bilirubin,Total: 0.8 mg/dL (ref 0.0–1.2)
CO2: 21 mmol/L (ref 20–28)
Calcium: 8.3 mg/dL — ABNORMAL LOW (ref 8.8–10.2)
Chloride: 107 mmol/L (ref 96–108)
Creatinine: 0.61 mg/dL (ref 0.51–0.95)
Glucose: 72 mg/dL (ref 60–99)
Lab: 15 mg/dL (ref 6–20)
Potassium: 4 mmol/L (ref 3.3–5.1)
Sodium: 140 mmol/L (ref 133–145)
Total Protein: 5.6 g/dL — ABNORMAL LOW (ref 6.3–7.7)
eGFR BY CREAT: 123 *

## 2022-03-04 LAB — CBC
Hematocrit: 25 % — ABNORMAL LOW (ref 34–45)
Hemoglobin: 7.9 g/dL — ABNORMAL LOW (ref 11.2–15.7)
MCH: 30 pg (ref 26–32)
MCHC: 32 g/dL (ref 32–36)
MCV: 96 fL — ABNORMAL HIGH (ref 79–95)
Platelets: 253 10*3/uL (ref 160–370)
RBC: 2.6 MIL/uL — ABNORMAL LOW (ref 3.9–5.2)
RDW: 13.1 % (ref 11.7–14.4)
WBC: 5.4 10*3/uL (ref 4.0–10.0)

## 2022-03-04 MED ORDER — IRON SUCROSE 20 MG/ML IV SOLN *I*
200.0000 mg | Freq: Once | INTRAVENOUS | Status: AC
Start: 2022-03-04 — End: 2022-03-04
  Administered 2022-03-04: 200 mg via INTRAVENOUS
  Filled 2022-03-04: qty 10

## 2022-03-04 MED ORDER — CYCLOBENZAPRINE HCL 5 MG PO TABS *I*
5.0000 mg | ORAL_TABLET | Freq: Three times a day (TID) | ORAL | 0 refills | Status: AC | PRN
Start: 2022-03-04 — End: 2022-04-03
  Filled 2022-03-04: qty 30, 10d supply, fill #0

## 2022-03-04 MED ORDER — SODIUM CHLORIDE 0.9 % FLUSH FOR PUMPS *I*
0.0000 mL/h | INTRAVENOUS | Status: DC | PRN
Start: 2022-03-04 — End: 2022-03-04

## 2022-03-04 MED ORDER — CYCLOBENZAPRINE HCL 5 MG PO TABS *I*
5.0000 mg | ORAL_TABLET | Freq: Once | ORAL | Status: AC
Start: 2022-03-04 — End: 2022-03-04
  Administered 2022-03-04: 5 mg via ORAL
  Filled 2022-03-04: qty 1

## 2022-03-04 MED ORDER — IBUPROFEN 800 MG PO TABS *I*
800.0000 mg | ORAL_TABLET | Freq: Once | ORAL | Status: AC
Start: 2022-03-04 — End: 2022-03-04
  Administered 2022-03-04: 800 mg via ORAL
  Filled 2022-03-04: qty 1

## 2022-03-04 MED ORDER — ACETAMINOPHEN 500 MG PO TABS *I*
1000.0000 mg | ORAL_TABLET | Freq: Once | ORAL | Status: AC
Start: 2022-03-04 — End: 2022-03-04
  Administered 2022-03-04: 1000 mg via ORAL
  Filled 2022-03-04: qty 2

## 2022-03-04 MED ORDER — DEXTROSE 5 % FLUSH FOR PUMPS *I*
0.0000 mL/h | INTRAVENOUS | Status: DC | PRN
Start: 2022-03-04 — End: 2022-03-04

## 2022-03-04 NOTE — Progress Notes (Addendum)
Assuming care of pt at this time

## 2022-03-04 NOTE — Progress Notes (Deleted)
I received report from Dr. Melbourne Abts earlier this morning and assumed care at 0700.    Alexis Vang was resting comfortably when I walked in the room and awoke to voice. She requests warm packs and breakfast.    Alexis 's doppler of LLE is normal, no evidence of thrombus. Here CBC and CMP are reassuring although significant for stable anemia (h/h unchanged from prior at 7.9/25. Preop h/h 10.4/32). mild elevation in ALT is noted. Alexis denies alcohol use.     Recent Results (from the past 24 hour(s))   CBC    Collection Time: 03/04/22  6:19 AM   Result Value Ref Range    WBC 5.4 4.0 - 10.0 THOU/uL    RBC 2.6 (L) 3.9 - 5.2 MIL/uL    Hemoglobin 7.9 (L) 11.2 - 15.7 g/dL    Hematocrit 25 (L) 34 - 45 %    MCV 96 (H) 79 - 95 fL    MCH 30 26 - 32 pg    MCHC 32 32 - 36 g/dL    RDW 13.1 11.7 - 14.4 %    Platelets 253 160 - 370 THOU/uL   Comprehensive metabolic panel    Collection Time: 03/04/22  6:19 AM   Result Value Ref Range    Sodium 140 133 - 145 mmol/L    Potassium 4.0 3.3 - 5.1 mmol/L    Chloride 107 96 - 108 mmol/L    CO2 21 20 - 28 mmol/L    Anion Gap 12 7 - 16    UN 15 6 - 20 mg/dL    Creatinine 0.61 0.51 - 0.95 mg/dL    eGFR BY CREAT 123 *    Glucose 72 60 - 99 mg/dL    Calcium 8.3 (L) 8.8 - 10.2 mg/dL    Total Protein 5.6 (L) 6.3 - 7.7 g/dL    Albumin 3.1 (L) 3.5 - 5.2 g/dL    Bilirubin,Total 0.8 0.0 - 1.2 mg/dL    AST 39 (H) 0 - 35 U/L    ALT 18 0 - 35 U/L    Alk Phos 142 (H) 35 - 105 U/L     Will discuss consideration of venofer with Dr. Nino Glow.  Awaiting psych consult regarding medication optimization.  Awaiting SW.    Marveen Reeks, CNM FNP-BC  03/04/2022  7:47 AM

## 2022-03-04 NOTE — OB Triage Note (Signed)
The pt presents to [facility] triage via EMS for c/o bil LE edema and lower abd/incisional pain. The pt reports that she thinks that she is "going to die tonight", and that she "has blood clots around her incision*. Lower abd/c-section incision healing well, edges well approximated, without drainage or erythema. Pt's affect frequently fluctuating between concern for "her life" and giggling about numerous points of our conversation. Otis Brace, MD notified of pt's arrival.
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
## 2022-03-06 ENCOUNTER — Other Ambulatory Visit: Payer: Self-pay

## 2022-03-06 ENCOUNTER — Ambulatory Visit: Payer: Medicaid Other | Admitting: Obstetrics and Gynecology

## 2022-03-06 DIAGNOSIS — F1990 Other psychoactive substance use, unspecified, uncomplicated: Secondary | ICD-10-CM

## 2022-03-06 LAB — GYN CYTOLOGY

## 2022-03-06 MED ORDER — ESCITALOPRAM OXALATE 20 MG PO TABS *I*
20.0000 mg | ORAL_TABLET | Freq: Every day | ORAL | 2 refills | Status: DC
Start: 2022-03-06 — End: 2022-04-12
  Filled 2022-03-06: qty 30, 30d supply, fill #0

## 2022-03-06 NOTE — Result Encounter Note (Signed)
Result reviewed. Pt notified of results at her PPM visit on 03/06/22. Needs colpo-sent to [facility] colpo pool for follow up.

## 2022-03-10 ENCOUNTER — Telehealth: Payer: Self-pay

## 2022-03-10 NOTE — Telephone Encounter (Signed)
OBGYN Answering Service Encounter      [name] Baumgarner is a 31 y.o. [phone] at [redacted]w[redacted]d who is about 1.5 weeks post op from rLTCS who calls the answering service with complaints of her c-section stitched coming undone. Patient states that she is having some discomfort but no bleeding from the incision site. States it is open about an inch.    Extremely difficult to understand if the skin was on tension or if the sutures are simply dissolving. Staff at [facility] was also on the phone by patient permission stating it is not bleeding and the wound is not actively open.     Patient has no other complaints at this time    Assessment and Plan:    After discussing with the patient and staff advised staffing to arrange transportation and an acute visit in the [facility] clinic tomorrow to evaluate the wound. Staffing aware and will pass along to case worker in the mornig     Marquette Saa, MD PGY1  Obstetric and Gynecology  Pager #: [phone]

## 2022-03-14 ENCOUNTER — Other Ambulatory Visit: Payer: Self-pay

## 2022-03-17 LAB — UNMAPPED LAB RESULTS
Basophil # (HT): 0 10 3/uL (ref 0.0–0.2)
Basophil % (HT): 1 % (ref 0–2)
Eosinophil # (HT): 0.1 10 3/uL (ref 0.0–0.5)
Eosinophil % (HT): 3 % (ref 0–7)
Hematocrit (HT): 31 % — ABNORMAL LOW (ref 40–52)
Hemoglobin (HGB) (HT): 10 g/dL — ABNORMAL LOW (ref 11.5–16.0)
Lymphocyte # (HT): 2.1 10 3/uL (ref 0.9–3.8)
Lymphocyte % (HT): 47 % — ABNORMAL HIGH (ref 17–44)
MCHC (HT): 31.8 g/dL — ABNORMAL LOW (ref 32.0–36.0)
MCV (HT): 95 fL (ref 81–99)
Mean Corpuscular Hemoglobin (MCH) (HT): 30.4 pg (ref 26.0–34.0)
Monocyte # (HT): 0.5 10 3/uL (ref 0.2–1.0)
Monocyte % (HT): 10 % (ref 4–15)
Neutrophil # (HT): 1.7 10 3/uL (ref 1.5–7.7)
Platelets (HT): 393 10 3/uL (ref 140–400)
RBC (HT): 3.29 10 6/uL — ABNORMAL LOW (ref 3.80–5.20)
RDW (HT): 13.8 % (ref 11.5–15.0)
Seg Neut % (HT): 39 % — ABNORMAL LOW (ref 43–75)
WBC (HT): 4.4 10 3/uL (ref 4.0–10.0)

## 2022-03-18 LAB — UNMAPPED LAB RESULTS
HBV Core Ab (HT): NEGATIVE
HBV S Ag (HT): NEGATIVE
Hep C Ab: NEGATIVE

## 2022-03-21 ENCOUNTER — Emergency Department
Admission: EM | Admit: 2022-03-21 | Discharge: 2022-03-21 | Discharge status: Against Medical Advice | Payer: Medicaid Other | Source: Ambulatory Visit | Attending: Emergency Medicine | Admitting: Emergency Medicine

## 2022-03-21 ENCOUNTER — Other Ambulatory Visit: Payer: Self-pay

## 2022-03-21 DIAGNOSIS — Z5321 Procedure and treatment not carried out due to patient leaving prior to being seen by health care provider: Secondary | ICD-10-CM | POA: Insufficient documentation

## 2022-03-21 MED ORDER — PREDNISONE 20 MG PO TABS *I*
40.0000 mg | ORAL_TABLET | Freq: Once | ORAL | Status: AC
Start: 2022-03-21 — End: 2022-03-21
  Administered 2022-03-21: 40 mg via ORAL
  Filled 2022-03-21: qty 2

## 2022-03-21 NOTE — ED Notes (Signed)
Awake, alert, oriented, independent. Minor swelling in lips. Pt airway intact, swallowing intact. Resting comfortably, no distress. Call bell within reach.

## 2022-03-21 NOTE — ED Triage Notes (Signed)
Pt had lobster around 1500 today. Shortly after developed lip swelling and itchiness in gums  with shortness of breath. Pt in no apparent distress at triage. Speaking in full sentences.        Blood Glucose Meter (mg/dl): 161  Prehospital medications given: Yes  Respiratory/Allergic Reaction: Benadryl (50 mg)

## 2022-03-22 ENCOUNTER — Encounter: Payer: Self-pay | Admitting: Student in an Organized Health Care Education/Training Program

## 2022-03-24 ENCOUNTER — Other Ambulatory Visit: Payer: Self-pay

## 2022-03-30 LAB — TREPONEMA PALLIDUM (SYPHILIS) ANTIBODY (HT): Syphilis Treponemal Ab (HT): NONREACTIVE

## 2022-04-08 ENCOUNTER — Telehealth: Payer: Self-pay

## 2022-04-08 ENCOUNTER — Encounter: Payer: Self-pay | Admitting: Psychiatry

## 2022-04-08 NOTE — Progress Notes (Signed)
Mobile Crisis Referral  The following was submitted.  Submitted At 04/08/2022 9:45:21 PM   Patient Information   First Name [name]    Last Name Isobelle Tuckett MRN [id number]   Date of Birth [birth year]   Gender Female   Parent Name (required if the patient is under the age of 57)    Address [address]    [place]   [place] [address]   Primary Phone [phone]   Secondary Phone    Referring Clinic Crisis Call Line   Reason for Mobile Crisis Referral Behavioral Health Crisis Call Center Note     Patient Demographics:   Patient Name: [name] Hickel  DOB: [birth year]  MRN: [id number]  Gender: female  Race:  Black or African American  Patient phone number: [phone]  Patient Address: [address]   [place] [address]     Call originated: Crisis Call Line     Is patient seeking Mobile Crisis Visit: Yes   Referrer Information:  Referrer Phone Number: [phone]   Location/Address of MCT visit, if different than above: No  Referrer Name: Keianna Signer   Referrer Source: Family & Friends  Referrer wish to remain anonymous? No  If yes- PROVIDER PLEASE UNSHARE NOTE  Is client aware of referral? N/A   School Based Referral: No     Visit Considerations: There are no weapons or animals in the home pre patient's report.   COVD-19 Concerns?: No  Interpreter Services needed?  No  Desired outcome:  Safety Check, Linkage to Care, Mental Health Assessment and Medication Consultation     Presenting Crisis:   Presenting problem: Depression and PTSD     Suicide Risk Assessment:  [place] Suicide Severity Rating Scale (C-SSRS)  [place] Suicide Severity Rating Scale 04/08/2022  [place] Suicide Severity Rating Scale Risk Calculation Low  Some encounter information is confidential and restricted. Go to Review Flowsheets activity to see all data.                    -Suicide Ideation: Patient denies current suicidal ideation.   -Homicidal Ideation: Patient denies current homicidal ideations.  -Self-Injurious  Behaviors: Patient denies self-injurious behaviors.   - Patient denies history of violence.      Addictive Behavior Screen:   Substance Use: Denies     Current MH providers: No  Medication: No     Call Narrative and Safety Planning: This Clinical research associate spoke to patient who express that she is currently looking to get connected with treatment. She reports that she has depression and PTSD. She reports that she really needs mental health help. This Clinical research associate expressed understanding. Patient did not endorse any thoughts, plan, and or intent to hurt herself of others. This Clinical research associate support patient with MCT.      Patient encouraged to call back as needed with concerns/ updates. Patient encouraged to call 911 for acute safety concerns. Patient was receptive.         Desired outcome:  Safety Check, Linkage to Care and Mental Health Assessment     Referrals: Accepted referrals to: MCT     Active Diagnosis: History of Mental Health   Visit Information   Date & Time  10/02/2021  1:49 PM Provider  Marcelina Morel Department  [facility] Mental Health and Wellness Encounter #  [id number]     Visit Information   Date & Time  08/22/2021  4:15 PM Provider  Laurier Nancy, BSW Department  [facility] Encounter #  [id number]           CALL DURATION: 20  RESPONDER: Rosalia Hammers, MS,LMHC,NCC,CASAC   Interpreter Services Needed? No   Which Language?    Safety Concerns? None   Other Concern    Requester Information   What is your full name? Jameshia Hayashida    Referrer wishes to remain anonymous    Requester Phone [phone]   Ext. Number    What is your relationship to the patient? Self- referred.    Requester Email (needed to receive a copy of this referral) [email or web address]

## 2022-04-08 NOTE — Progress Notes (Signed)
TC to pt [phone], confirmed DOB and address. Informed MCT services being billable and voluntary  Pt voiced wanting to be connected to treatment, has in the past but discontinued for being misdiagnosed.  Pt also viced not being med complaint and having trouble finding what works for her  Pt denied SI/HI, denied any safety concerns at this time  Pt voiced she is currently staying at a friend house and that is where she would like to meet : [employer]. [phone]  Writer verified that patient had CCL/MCT contact information and advised her to call with any changes   Writer recommend she call 911 or present pt to ED if any imminent safety concerns arise, pt voiced understanding  No weapons  No pets    MCT visit 4/12 @ 4:30p

## 2022-04-08 NOTE — Telephone Encounter (Signed)
Behavioral Health Crisis Call Center Note     Patient Demographics:   Patient Name: Alexis Vang  DOB: [birth year]  MRN: [id number]  Gender: female  Race:  Black or African American  Patient phone number: [phone]  Patient Address: [address]   [place] [address]    Call originated: Crisis Call Line    Is patient seeking Mobile Crisis Visit: Yes   Referrer Information:  Referrer Phone Number: [phone]   Location/Address of MCT visit, if different than above: No  Referrer Name: Leanah Kolander   Referrer Source: Family & Friends  Referrer wish to remain anonymous? No   If yes- PROVIDER PLEASE UNSHARE NOTE  Is client aware of referral? N/A   School Based Referral: No    Visit Considerations: There are no weapons or animals in the home pre patient's report.   COVD-19 Concerns?: No  Interpreter Services needed?  No  Desired outcome:  Safety Check, Linkage to Care, Mental Health Assessment and Medication Consultation    Presenting Crisis:   Presenting problem: Depression and PTSD    Suicide Risk Assessment:  [place] Suicide Severity Rating Scale (C-SSRS)  [place] Suicide Severity Rating Scale 04/08/2022   [place] Suicide Severity Rating Scale Risk Calculation Low   Some encounter information is confidential and restricted. Go to Review Flowsheets activity to see all data.               -Suicide Ideation: Patient denies current suicidal ideation.   -Homicidal Ideation: Patient denies current homicidal ideations.  -Self-Injurious Behaviors: Patient denies self-injurious behaviors.   - Patient denies history of violence.     Addictive Behavior Screen:   Substance Use: Denies    Current MH providers: No  Medication: No    Call Narrative and Safety Planning: This Clinical research associate spoke to patient who express that she is currently looking to get connected with treatment. She reports that she has depression and PTSD. She reports that she really needs mental health help. This Clinical research associate expressed understanding. Patient did not  endorse any thoughts, plan, and or intent to hurt herself of others. This Clinical research associate support patient with MCT.     Patient encouraged to call back as needed with concerns/ updates. Patient encouraged to call 911 for acute safety concerns. Patient was receptive.       Desired outcome:  Safety Check, Linkage to Care and Mental Health Assessment    Referrals: Accepted referrals to: MCT    Active Diagnosis: History of Mental Health   Visit Information     Date & Time  10/02/2021  1:49 PM Provider  Marcelina Morel Department  [facility] Encounter #  [id number]      Visit Information     Date & Time  08/22/2021  4:15 PM Provider  Laurier Nancy, BSW Department  [facility] Encounter #  [id number]          CALL DURATION: 20  RESPONDER: Rosalia Hammers, MS,LMHC,NCC,CASAC

## 2022-04-09 ENCOUNTER — Encounter: Payer: Self-pay | Admitting: Psychiatry

## 2022-04-09 ENCOUNTER — Ambulatory Visit: Payer: Self-pay | Admitting: Psychiatry

## 2022-04-09 NOTE — Progress Notes (Addendum)
Writer called pt to update them on MCT arrival time. Pt did not answer phone did not ring and went straight to voicemail. Writer left a voice message informing her MCT will be arriving at 1640.

## 2022-04-09 NOTE — Progress Notes (Signed)
Mobile Crisis Team Note:  MCT (K Doreene Eland and Trevor Mace) arrived to client's house( [address] [phone]) on  04/09/22 for pt's self scheduled visit. Due to safety concerns team called pt several times and phone went straight to voice mail, as team was leaving a gentleman came out the building staing " she's not here, she left this morning and hasn't returned, gentleman further stated " I know she  (Ni) made the appointment with you all and now she's not here and she's supposed to be"Team called client at the number listed on file, at time of teams arrival, phone rang to voicemail-unable to leave message. Team left team's brochure with pt's friend          MCT's case is closed until further notice.

## 2022-04-09 NOTE — Progress Notes (Addendum)
MCT arrived at [address] [place] [address]. Upon arrival MCT called pt at 1645 to confirm which apartment pt is currently at. Pt did not answer, phone did not ring and went straight to voicemail. A man came to the door and informed MCT that pt was not there. Man identified himself as the pt's friend. MCT left an MCT brochure with friend to give to the pt. Pt's riend confirmed he will inform pt MCT arrived and give her the brochure. MCT case closed.
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
## 2022-04-10 ENCOUNTER — Ambulatory Visit: Payer: Self-pay

## 2022-04-11 ENCOUNTER — Telehealth: Payer: Self-pay

## 2022-04-11 ENCOUNTER — Emergency Department
Admission: EM | Admit: 2022-04-11 | Discharge: 2022-04-12 | Disposition: A | Discharge status: Other Acute Inpatient Hospital | Payer: Medicaid Other | Source: Ambulatory Visit | Attending: Emergency Medicine | Admitting: Emergency Medicine

## 2022-04-11 ENCOUNTER — Other Ambulatory Visit: Payer: Self-pay

## 2022-04-11 DIAGNOSIS — Z789 Other specified health status: Secondary | ICD-10-CM

## 2022-04-11 DIAGNOSIS — R45851 Suicidal ideations: Secondary | ICD-10-CM | POA: Insufficient documentation

## 2022-04-11 DIAGNOSIS — I493 Ventricular premature depolarization: Secondary | ICD-10-CM

## 2022-04-11 DIAGNOSIS — Z59 Unhousedness unspecified: Secondary | ICD-10-CM | POA: Insufficient documentation

## 2022-04-11 LAB — CBC AND DIFFERENTIAL
Baso # K/uL: 0 10*3/uL (ref 0.0–0.1)
Basophil %: 0.6 %
Eos # K/uL: 0.2 10*3/uL (ref 0.0–0.4)
Eosinophil %: 3.6 %
Hematocrit: 40 % (ref 34–45)
Hemoglobin: 12.3 g/dL (ref 11.2–15.7)
IMM Granulocytes #: 0 10*3/uL (ref 0.0–0.0)
IMM Granulocytes: 0.4 %
Lymph # K/uL: 2 10*3/uL (ref 1.2–3.7)
Lymphocyte %: 38.3 %
MCH: 30 pg (ref 26–32)
MCHC: 31 g/dL — ABNORMAL LOW (ref 32–36)
MCV: 96 fL — ABNORMAL HIGH (ref 79–95)
Mono # K/uL: 0.6 10*3/uL (ref 0.2–0.9)
Monocyte %: 10.7 %
Neut # K/uL: 2.5 10*3/uL (ref 1.6–6.1)
Nucl RBC # K/uL: 0 10*3/uL (ref 0.0–0.0)
Nucl RBC %: 0 /100 WBC (ref 0.0–0.2)
Platelets: 356 10*3/uL (ref 160–370)
RBC: 4.1 MIL/uL (ref 3.9–5.2)
RDW: 13.3 % (ref 11.7–14.4)
Seg Neut %: 46.4 %
WBC: 5.3 10*3/uL (ref 4.0–10.0)

## 2022-04-11 NOTE — Telephone Encounter (Signed)
Tried to contact pt to reschedule 6 week ppm couldn't leave vm will attempt monday
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
## 2022-04-12 ENCOUNTER — Other Ambulatory Visit: Payer: Self-pay

## 2022-04-12 ENCOUNTER — Inpatient Hospital Stay
Admission: EM | Admit: 2022-04-12 | Discharge: 2022-05-14 | DRG: 751 | Disposition: A | Discharge status: Home / Self Care | Payer: Medicaid Other | Source: Ambulatory Visit | Attending: Child & Adolescent Psychiatry | Admitting: Child & Adolescent Psychiatry

## 2022-04-12 ENCOUNTER — Encounter: Payer: Self-pay | Admitting: Emergency Medicine

## 2022-04-12 DIAGNOSIS — F1510 Other stimulant abuse, uncomplicated: Secondary | ICD-10-CM | POA: Diagnosis present

## 2022-04-12 DIAGNOSIS — Z87891 Personal history of nicotine dependence: Secondary | ICD-10-CM

## 2022-04-12 DIAGNOSIS — Z5941 Food insecurity: Secondary | ICD-10-CM

## 2022-04-12 DIAGNOSIS — I493 Ventricular premature depolarization: Secondary | ICD-10-CM

## 2022-04-12 DIAGNOSIS — R45851 Suicidal ideations: Secondary | ICD-10-CM | POA: Diagnosis present

## 2022-04-12 DIAGNOSIS — F1210 Cannabis abuse, uncomplicated: Secondary | ICD-10-CM | POA: Diagnosis present

## 2022-04-12 DIAGNOSIS — X58XXXA Exposure to other specified factors, initial encounter: Secondary | ICD-10-CM | POA: Diagnosis present

## 2022-04-12 DIAGNOSIS — F1410 Cocaine abuse, uncomplicated: Secondary | ICD-10-CM | POA: Diagnosis present

## 2022-04-12 DIAGNOSIS — F1110 Opioid abuse, uncomplicated: Secondary | ICD-10-CM | POA: Diagnosis present

## 2022-04-12 DIAGNOSIS — L02412 Cutaneous abscess of left axilla: Secondary | ICD-10-CM | POA: Diagnosis present

## 2022-04-12 DIAGNOSIS — Z62810 Personal history of physical and sexual abuse in childhood: Secondary | ICD-10-CM | POA: Diagnosis present

## 2022-04-12 DIAGNOSIS — Z20822 Contact with and (suspected) exposure to covid-19: Secondary | ICD-10-CM | POA: Diagnosis present

## 2022-04-12 DIAGNOSIS — R21 Rash and other nonspecific skin eruption: Secondary | ICD-10-CM | POA: Diagnosis present

## 2022-04-12 DIAGNOSIS — Z5982 Transportation insecurity: Secondary | ICD-10-CM

## 2022-04-12 DIAGNOSIS — L02411 Cutaneous abscess of right axilla: Secondary | ICD-10-CM | POA: Diagnosis present

## 2022-04-12 DIAGNOSIS — F4310 Post-traumatic stress disorder, unspecified: Secondary | ICD-10-CM | POA: Diagnosis present

## 2022-04-12 DIAGNOSIS — R300 Dysuria: Secondary | ICD-10-CM | POA: Diagnosis present

## 2022-04-12 DIAGNOSIS — Y9289 Other specified places as the place of occurrence of the external cause: Secondary | ICD-10-CM

## 2022-04-12 DIAGNOSIS — T43226A Underdosing of selective serotonin reuptake inhibitors, initial encounter: Secondary | ICD-10-CM | POA: Diagnosis present

## 2022-04-12 DIAGNOSIS — X58XXXS Exposure to other specified factors, sequela: Secondary | ICD-10-CM | POA: Diagnosis present

## 2022-04-12 DIAGNOSIS — Z59819 Housing instability, housed unspecified: Secondary | ICD-10-CM

## 2022-04-12 DIAGNOSIS — Z91128 Patient's intentional underdosing of medication regimen for other reason: Secondary | ICD-10-CM

## 2022-04-12 DIAGNOSIS — K432 Incisional hernia without obstruction or gangrene: Secondary | ICD-10-CM | POA: Diagnosis present

## 2022-04-12 DIAGNOSIS — Z59812 Housing instability, housed, unhousedness in past 12 months: Secondary | ICD-10-CM

## 2022-04-12 DIAGNOSIS — F29 Unspecified psychosis not due to a substance or known physiological condition: Principal | ICD-10-CM | POA: Diagnosis present

## 2022-04-12 DIAGNOSIS — Z789 Other specified health status: Secondary | ICD-10-CM

## 2022-04-12 DIAGNOSIS — B9561 Methicillin susceptible Staphylococcus aureus infection as the cause of diseases classified elsewhere: Secondary | ICD-10-CM | POA: Diagnosis present

## 2022-04-12 DIAGNOSIS — R45850 Homicidal ideations: Secondary | ICD-10-CM | POA: Diagnosis present

## 2022-04-12 DIAGNOSIS — T43596A Underdosing of other antipsychotics and neuroleptics, initial encounter: Secondary | ICD-10-CM | POA: Diagnosis present

## 2022-04-12 DIAGNOSIS — Z59 Unhousedness unspecified: Secondary | ICD-10-CM

## 2022-04-12 LAB — BASIC METABOLIC PANEL
Anion Gap: 16 (ref 7–16)
CO2: 19 mmol/L — ABNORMAL LOW (ref 20–28)
Calcium: 8.9 mg/dL (ref 8.8–10.2)
Chloride: 103 mmol/L (ref 96–108)
Creatinine: 0.84 mg/dL (ref 0.51–0.95)
Glucose: 77 mg/dL (ref 60–99)
Lab: 19 mg/dL (ref 6–20)
Potassium: 4 mmol/L (ref 3.3–5.1)
Sodium: 138 mmol/L (ref 133–145)
eGFR BY CREAT: 95 *

## 2022-04-12 LAB — RUQ PANEL (ED ONLY)
ALT: 12 U/L (ref 0–35)
AST: 21 U/L (ref 0–35)
Albumin: 4.2 g/dL (ref 3.5–5.2)
Alk Phos: 91 U/L (ref 35–105)
Amylase: 71 U/L (ref 28–100)
Bili,Indirect: 0.5 mg/dL (ref 0.1–1.0)
Bilirubin,Direct: 0.2 mg/dL (ref 0.0–0.3)
Bilirubin,Total: 0.7 mg/dL (ref 0.0–1.2)
Lipase: 27 U/L (ref 13–60)
Total Protein: 7.4 g/dL (ref 6.3–7.7)

## 2022-04-12 LAB — PREGNANCY TEST, SERUM: Preg,Serum: NEGATIVE

## 2022-04-12 LAB — ACETAMINOPHEN LEVEL: Acetaminophen: <5 ug/mL

## 2022-04-12 LAB — SALICYLATE LEVEL: Salicylate: <3 mg/dL — ABNORMAL LOW (ref 15.0–30.0)

## 2022-04-12 LAB — DRUG SCREEN CHEMICAL DEPENDENCY, URINE
Amphetamine,UR: NEGATIVE
Benzodiazepinen,UR: NEGATIVE
Cocaine/Metab,UR: POSITIVE
Fentanyl, UR: POSITIVE ng/mL
Opiates,UR: NEGATIVE
THC Metabolite,UR: NEGATIVE

## 2022-04-12 LAB — ETHANOL: Ethanol: <10 mg/dL (ref 0–9)

## 2022-04-12 MED ORDER — RISPERIDONE 1 MG PO TBDP *I*
1.0000 mg | ORAL_TABLET | Freq: Two times a day (BID) | ORAL | Status: DC | PRN
Start: 2022-04-12 — End: 2022-04-15
  Administered 2022-04-14: 1 mg via ORAL
  Filled 2022-04-12 (×3): qty 1

## 2022-04-12 MED ORDER — HYDROXYZINE HCL 50 MG PO TABS *I*
50.0000 mg | ORAL_TABLET | Freq: Three times a day (TID) | ORAL | Status: DC | PRN
Start: 2022-04-12 — End: 2022-05-14
  Administered 2022-04-13 – 2022-05-14 (×15): 50 mg via ORAL
  Filled 2022-04-12 (×11): qty 1
  Filled 2022-04-12 (×2): qty 2
  Filled 2022-04-12 (×2): qty 1

## 2022-04-12 MED ORDER — RISPERIDONE 1 MG PO TBDP *I*
1.0000 mg | ORAL_TABLET | Freq: Two times a day (BID) | ORAL | Status: DC
Start: 2022-04-12 — End: 2022-04-17
  Administered 2022-04-13 – 2022-04-16 (×4): 1 mg via ORAL
  Filled 2022-04-12 (×10): qty 1

## 2022-04-12 MED ORDER — ALUM & MAG HYDROXIDE-SIMETH 200-200-20 MG/5ML PO SUSP *I*
30.0000 mL | Freq: Three times a day (TID) | ORAL | Status: DC | PRN
Start: 2022-04-12 — End: 2022-05-14
  Administered 2022-05-03: 30 mL via ORAL
  Filled 2022-04-12: qty 30

## 2022-04-12 MED ORDER — ACETAMINOPHEN 325 MG PO TABS *I*
650.0000 mg | ORAL_TABLET | ORAL | Status: DC | PRN
Start: 2022-04-12 — End: 2022-04-25
  Administered 2022-04-13 – 2022-04-25 (×8): 650 mg via ORAL
  Filled 2022-04-12 (×9): qty 2

## 2022-04-12 MED ORDER — MULTIVITAMIN ADULTS 50+ PO TABS *I*
1.0000 | ORAL_TABLET | Freq: Every day | ORAL | Status: DC
Start: 2022-04-13 — End: 2022-05-14
  Administered 2022-04-13 – 2022-05-14 (×30): 1 via ORAL
  Filled 2022-04-12 (×34): qty 1

## 2022-04-12 MED ORDER — MELATONIN 3 MG PO TABS *I*
3.0000 mg | ORAL_TABLET | Freq: Every day | ORAL | Status: DC
Start: 2022-04-12 — End: 2022-05-14
  Administered 2022-04-12 – 2022-05-13 (×31): 3 mg via ORAL
  Filled 2022-04-12 (×31): qty 1

## 2022-04-12 MED ORDER — MAGNESIUM HYDROXIDE 400 MG/5ML PO SUSP *I*
30.0000 mL | Freq: Every day | ORAL | Status: DC | PRN
Start: 2022-04-12 — End: 2022-05-14
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
## 2022-04-12 NOTE — Plan of Care (Signed)
Confidential Drug Report  Search Terms: [name] Bolanos, 12/19/1991   Search Date: 04/12/2022 19:15:46 PM   Searching on behalf of: 04[facility] - [facility]     Rx Written Rx Dispensed Drug Quantity Days Supply Prescriber Name Prescriber Dea # Payment Method Dispenser   03/01/2022 03/01/2022 oxycodone hcl (ir) 5 mg tablet  20 4 [facility] [id number] Other The Jene Every, MD  Resident  04/12/22 [phone]

## 2022-04-12 NOTE — CPEP Notes (Signed)
04/12/22 0329   [place] Suicide Severity Rating Scale Screen - Past Month        1. Have you wished you were dead or wished you could go to sleep and not wake up? Yes        2. Have you actually had any thoughts of killing yourself? No        6. Have you done anything, started to do anything, or prepared to do anything to end your life? No

## 2022-04-12 NOTE — CPEP Notes (Signed)
CPEP Charge Nurse Note    Report received from: sean RN, via EMS, voluntarily accompanied by patient alone. Pt with PPHx of PTSD, SUD presents today SI/HI and she is living in a crack house.       Chief Complaint   Patient presents with   . Suicidal       Allergies as of 04/12/2022 - Up to Date 04/12/2022   Allergen Reaction Noted   . Shellfish-derived products Swelling 03/21/2022       Past Medical History:   Diagnosis Date   . Anxiety    . Asthma    . Depression    . Gestational diabetes    . Preeclampsia    . Scabies    . Substance abuse      Substance use: denies    Ingestion: Denies    Medical clearance: NA    Vital signs:  Last Filed Vitals    04/12/22 0300   BP: 102/68   Pulse: 82   Resp: 16   Temp: 36.7 C (98.1 F)   SpO2: 98%     Last Nursing documented pain:  0-10 Scale: 0 (04/12/22 0300)    Pre-Arrival Notifications: No    Patient location: EMS    Willaim Rayas, RN, 3:03 AM

## 2022-04-12 NOTE — CPEP Notes (Signed)
Pt's belongings were left downstairs in trauma bay closet (suitcase and 5 bags).
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
## 2022-04-12 NOTE — CPEP Notes (Signed)
Writer spoke with pt asking about collateral contacts, pt reports "most of my family is dead and I do not give consent for you to talk to others about my life or problems". Pt reports she would like to be admitted and did not want to speak with social worker, only wants to talk to psychiatrist.
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
## 2022-04-12 NOTE — ED Triage Notes (Signed)
Transfer from [facility]. Pt with SI/HI. No drugs today. Homeless, lives at crack house.      Prehospital medications given: No

## 2022-04-12 NOTE — CPEP Notes (Signed)
Clinical Evaluator Collateral Note    Professional:                         Personal:   [name]- Mother- [phone]  Writer called and left a voice message for pt's mother asking for a return call.       [name]- [phone]  Writer called and spoke with pt's sister, Alexis Vang. Alexis Vang reports she last spoke to pt approximately 4 days ago, "she seemed to be OK, she was trying to find a place to stay". Pt is currently staying with a friend, denies that pt has access to fire arms.  Sister reports pt has a hx of drug abuse, baby (born 03/05/2022) is currently in foster care, Alexis Vang is working to get custody of pt's baby. Alexis Vang reports that pt is connected to a Child psychotherapist,  "Felicia/Lisa Neale Burly" possibly someone who works in the court system.          Pasty Spillers, LMSW, 3:40 PM

## 2022-04-12 NOTE — CPEP Notes (Signed)
Patient resting in the flex talking with peers. Behaviors are safe and appropriate this shift. Patient refused her evening dose of risperidone( she states I will only take meds I want to take and I am not taking that one). Education provided and patient still refused. Patient offers no issues or complaints at this time. Patient is able to make her needs know, 15 minute checks continue for safety.

## 2022-04-12 NOTE — CPEP Notes (Signed)
Patient resting in in the milieu, she was compliant with UDS. Patient offers no issues or complaints at this time. Patient is able to make her needs know. 15 minute checks continue for safety.

## 2022-04-13 LAB — HEPATITIS A,B,C,PANEL
HBV Core Ab: NEGATIVE
HBV S Ab Quant: 1.49 m[IU]/mL
HBV S Ab: NEGATIVE
HBV S Ag: NEGATIVE
Hep C Ab: NEGATIVE
Hepatitis A IGG: POSITIVE

## 2022-04-13 LAB — COVID-19 NAAT (PCR): COVID-19 NAAT (PCR): NEGATIVE

## 2022-04-13 LAB — HIV-1/2  ANTIGEN/ANTIBODY SCREEN WITH CONFIRMATION: HIV 1&2 ANTIGEN/ANTIBODY: NONREACTIVE

## 2022-04-13 LAB — HOLD LAVENDER

## 2022-04-13 LAB — PERFORMING LAB

## 2022-04-13 NOTE — CPEP Notes (Signed)
Alexis Vang patients adoptive mother who has had her since she was 24 weeks old, returning a call to the SW who worked last night. Alexis Vang lives in [place] her number is [phone]. She states she can be called at any time with any questions about the patient.

## 2022-04-13 NOTE — CPEP Notes (Signed)
Patient resting comfortably throughout the shift.  Able to make needs known appropriately.  Monitor every 15 mins for safety and support.

## 2022-04-13 NOTE — CPEP Notes (Addendum)
Patient is resting in the flex area this shift. Patient refused her Risperdal this morning stating I don't take that medication. Education provided about same, but continues to refuse. Patient was cooperative with lab work and Covid swab this morning. When patient uses the restroom she takes about 10 minutes. She washes her hands over and over, usually requires a new top due to being wet after words. Patient continues to wait for an inpatient bed to open. Patient offers no issues or complaints at this time. Patient is able to make her needs know, 15 minute checks continue for safety.

## 2022-04-13 NOTE — CPEP Notes (Signed)
Pt was taken to the shower upon request by Clinical research associate. While in the shower, patient was responding to internal stimuli and yelling profanities. Pt was asked to finish up in the shower after 30 minutes, but had to be reminded every 5 minutes to turn the shower off and get dressed. Pt eventually left the shower room after intervention, but flooded the floor and left all linens soaking wet. Pt appeared disorganized after returning to the milieu, attempting to get dressed in front of others and requesting copious amounts of hygiene materials. Pt is pleasant upon intervention and redirectable after a few attempts.

## 2022-04-14 ENCOUNTER — Encounter: Payer: Self-pay | Admitting: Psychiatry

## 2022-04-14 LAB — COCAINE, URINE, CONFIRMATION: Confirm COC/METAB: POSITIVE

## 2022-04-14 MED ORDER — ESCITALOPRAM OXALATE 10 MG PO TABS *I*
10.0000 mg | ORAL_TABLET | Freq: Every day | ORAL | Status: DC
Start: 2022-04-14 — End: 2022-04-18
  Administered 2022-04-14 – 2022-04-18 (×5): 10 mg via ORAL
  Filled 2022-04-14 (×5): qty 1

## 2022-04-14 MED ORDER — TRIAMCINOLONE ACETONIDE 0.1 % EX OINT *I*
TOPICAL_OINTMENT | Freq: Two times a day (BID) | CUTANEOUS | Status: AC
Start: 2022-04-14 — End: 2022-04-28
  Filled 2022-04-14 (×2): qty 80
  Filled 2022-04-14: qty 15

## 2022-04-14 MED ORDER — PERMETHRIN 5 % EX CREA *I*
TOPICAL_CREAM | Freq: Once | CUTANEOUS | Status: AC
Start: 2022-04-14 — End: 2022-04-14
  Filled 2022-04-14: qty 60
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
## 2022-04-14 NOTE — CPEP Notes (Signed)
CPEP Provider Progress Note    Patient seen and evaluated, chart reviewed, case discussed with staff.  Please see initial Evaluation note for details on presentation.    Interim History:   Pt has been intermittently refusing scheduled risperdal; utilized tylenol and hydroxyzine PRNs, over past shift. VSS. Pt has remained in behavioral control; however, struggled with labile mood and irritability along with yelling at unseen others while attempting to shower last evening. On contact, pt is soft-spoken and cooperative with irritable edge at times. She reports "needing to go upstairs" and appears to be aware of plan for admission, although is minimally able to discuss recent events. She appears to be responding to internal stimuli at times, thought process varies from tangential to disorganized, she is observed pacing and fidgeting, but her insight appears to be improving somewhat. She is agreeable to take AM dose of risperdal after additional medication education, and requests to restart lexapro for low mood.     Vital Signs:     Last Filed Vitals    04/13/22 2220   BP: 116/53   Pulse: 84   Resp: 18   Temp: 35.4 C (95.7 F)   SpO2: 99%     Mental Status Examination:   Mental Status Exam  Appearance: Poor hygiene, Disheveled, Other (In hospital scrubs, wearing mesh underwear as a shirt under scrub top)  Relationship to Interviewer: Eye contact limited, Guarded, Cooperative  Psychomotor Activity: Fidgeting, Increased, Pacing  Abnormal Movements: None  Muscle Strength and Tone: Normal  Station/Gait : Normal  Speech : Other (Soft-spoken, slowed, limited quantity of speech)  Language: Fluent  Mood: Irritable  Affect: Labile, Irritable  Thought Process: Tangential, Goal-directed  Thought Content: Paranoia  Perceptions/Associations : Other (Denies AVH; observed responding to internal stimuli)  Sensorium: Drowsy, Arousable, Oriented x3  Cognition: Poor attention span  Fund of Knowledge: Below average  Insight : Poor,  Improving  Judgement: Poor    Labs:     All labs in the last 24 hours:   Recent Results (from the past 24 hour(s))   COVID-19 NAAT (PCR) 0 Day    Collection Time: 04/13/22 10:01 AM   Result Value Ref Range    COVID-19 Source Nasal     COVID-19 NAAT (PCR) NEGATIVE NEG   Hepatitis A,B,C,panel    Collection Time: 04/13/22 10:01 AM   Result Value Ref Range    Hepatitis A IGG POS     HBV S Ag NEG NEG    HBV S Ab NEG     HBV S Ab Quant 1.49 mIU/mL    HBV Core Ab NEG NEG    HBV Interp see below     Hep C Ab NEG NEG   HIV 1&2 antigen/antibody    Collection Time: 04/13/22 10:01 AM   Result Value Ref Range    HIV 1&2 ANTIGEN/ANTIBODY Nonreactive Nonreactive   Hold lavender    Collection Time: 04/13/22 10:01 AM   Result Value Ref Range    Hold Lav HOLD TUBES    Performing Lab    Collection Time: 04/13/22 10:01 AM   Result Value Ref Range    Performing Lab see below        Diagnosis:     Final diagnoses:   Psychosis, unspecified psychosis type     Assessment/Plan:   Alexis Vang is a 31yr old female with history significant for PTSD, depression, schizoaffective disorder vs schizophrenia, prior psychiatric admissions, and polysubstance use presented voluntarily to [facility] ED with report of SI/HI.  She was admitted on 9.39 status d/t concern for acute psychosis; currently boarding in CPEP, awaiting available inpatient bed. She continues to present with mood lability, tangential to disorganized thought-process, intermittent agitation, poor sleep, inability to care for self, and evidence of AVH. She has been inconsitently compliant with scheduled medications, but agreeable to take medications today. Continued stay is indicated for further stabilization and safety.    Plan  -Continue on 9.39 status; seen for confirmation today, by Dr Jenne Campus  -Restart lexapro 10mg  QAM  -Continue milieu therapy    [place] Suicide Severity Rating:   [place] Suicide Severity Rating Scale Screen - Past Month       1. Have you wished you were dead or  wished you could go to sleep and not wake up?: Yes       2. Have you actually had any thoughts of killing yourself?: No       6. Have you done anything, started to do anything, or prepared to do anything to end your life?: No  [place] Suicide Severity Rating Scale Risk Calculation: Low    Laquinn Shippy, NP     Serafina Royals, NP  04/14/22 [phone]

## 2022-04-14 NOTE — Plan of Care (Signed)
Pt arrived to [id number] at 2040 from CPEP via wheelchair with DPS and staff escort.  VSS obtained  on arrival.  Endorsing abdominal pain on arrival r/t not having enough to eat, medicated with prn tylenol.  Endorsing SI and HI towards CPS. Pt admitted on 936 legal status, placed in chart on arrival.  On Suicide Precautions and Violence Precautions.  Alert and oriented to person, place, time, situation.  Electronic admission completed, tolerated well.  Paper admission completed.  Oriented to unit and schedule, reviewed mutual expectations.  Psych Tech to inventory and document Pt belongings.      Pt was received via wheelchair from CPEP. She is quiet, guarded, appears depressed, sad affect.She is guarded , poor eye contact with limited interactions with Clinical research associate. Reports current SI, HI refused to elaborate on plan or towards who she is feeling homicidal, compacted for safety on unit. Denies AH,VH, rates anxiety at 5/10, depression 5/10. Pt reports current stress over housing, employment and food. She is paranoid, refusing to take medications while others are walking by, requesting new medications be given. She was oriented to unit and schedule of events. Will monitor for safety and security.

## 2022-04-15 DIAGNOSIS — Z59819 Housing instability, housed unspecified: Secondary | ICD-10-CM

## 2022-04-15 DIAGNOSIS — Z91148 Patient's other noncompliance with medication regimen for other reason: Secondary | ICD-10-CM

## 2022-04-15 DIAGNOSIS — F29 Unspecified psychosis not due to a substance or known physiological condition: Secondary | ICD-10-CM

## 2022-04-15 LAB — PERFORMING LAB

## 2022-04-15 LAB — FENTANYL AND METABOLITE, UR QNT
Fentanyl, UR QNT: <1 ng/mL
Norfentanyl, UR QNT: 13 ng/mL

## 2022-04-15 LAB — COVID-19 NAAT (PCR): COVID-19 NAAT (PCR): NEGATIVE

## 2022-04-15 MED ORDER — OLANZAPINE 5 MG PO TBDP *I*
5.0000 mg | ORAL_TABLET | Freq: Two times a day (BID) | ORAL | Status: DC | PRN
Start: 2022-04-15 — End: 2022-05-14
  Administered 2022-04-20 – 2022-05-12 (×10): 5 mg via ORAL
  Filled 2022-04-15 (×10): qty 1
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
## 2022-04-15 NOTE — Telephone Encounter (Signed)
Pt is being housed inpatient for suicidal thoughts will not be rescheduling ppm
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
## 2022-04-15 NOTE — Consults (Signed)
Medical Nutrition Therapy- Brief Note  Food Allergy Clarification    Patient was visited to clarify their allergy to Shellfish. Fish and [employer] also reported at this time.   Reaction type and severity: Please see allergy tab.  Reads food labels/ingredients:  Yes    Food Allergy Plan:  Allergy to Fish and Shellfish. Patient will order off the Allergy menu (top 9) which indicates compliant choices based on patient's food allergies.     Pt also with intolerance to [employer]. A note will be made in food service software and patient will be reminded to avoid ordering meal choices containing Collard Greens.          Lenice Pressman, DTR  Registered Dietetic Technician  PIC [phone]

## 2022-04-15 NOTE — Plan of Care (Signed)
Shift Note: 0800 to 1630    . Psychiatric Presentation (Mood, affect, milieu behaviors): Pt presents as dysphoric with irritable affect.  Pt declined risperdal but accepted the rest of her morning medication.  Pt endorses SI/HI (see below) but unable to elaborate in detail. Stated "I want something to help me focus." Writer offered risperdal again and pt declined stating, "I want something else". COVID swab completed and sent to lab.  Pt agreed to collecting urine sample, cup placed on desk. Throughout shift, Clinical research associate retrieved several belongings for pt. Pt becoming irritable when she was not permitted to have certain items d/t unit policy, stating "If you're gonna be that petty then whatever." When Writer explained that it was for safety reasons, she stated "Yeah. I doubt that." Endorses pain on the skin of her hands and concern that she has a "skin infection" on her hands/wrists.  On observation skin on hands/fingers/wrist appeared dry.  Topical Kenalog ointment applied to good effect.  Pt has been in good behavioral control.    . Safety Concerns: Pt endorses SI "sometimes" and said "yes" to HI. Pt unable to elaborate regarding specifics stating, "I don't know, its difficult for me to focus my thoughts right now because I'm unwell."    . Pain: Pain hands/fingers, relieved with topical cream.    . Pt is performing ADLs: Independently;     . Internal stimuli: Patient denies    . Observed response to scheduled medications: tolerating    . If uncooperative with scheduled medications:  o # times offered: 2  o Strategies used for encouraging med adherence and effectiveness: medication education to no effect.      Mental Status Exam  Appearance: Appropriately dressed  Relationship to Interviewer: Eye contact limited, Cooperative  Psychomotor Activity: Normal  Abnormal Movements: None  Muscle Strength and Tone: Normal  Station/Gait : Normal  Speech : Regular rate, Normal amount  Language: Normal comprehension  Mood:  Dysphoric  Affect: Depressed  Thought Process: Goal-directed  Thought Content: Paranoia  Perceptions/Associations : No hallucinations  Sensorium: Alert  Cognition: Recent memory intact  Fund of Knowledge: Normal  Insight : Fair  Judgement: Fair      Problem: Risk for Suicide  Goal: Patient Will Be Safe And Free From Injury Throughout Hospitalization  Description: This goal applies for the duration of hospitalization  Outcome: Maintaining     Problem: Risk for Violence  Goal: Patient Will Identify 2 Alternative Ways Of Dealing With Stress And Emotional Problems By Day 3  Description: Patient is to IDENTIFY healthy coping skills.  For example, talking with staff or significant others  Outcome: Progressing towards goal

## 2022-04-15 NOTE — Progress Notes (Signed)
04/15/22 0900   [facility] Patient Class Review   Patient Class Review Inpatient     Patient class effective 04/14/22    Will Bonnet, RN  Behavioral Health  Utilization Review  Pager# [phone]

## 2022-04-15 NOTE — Plan of Care (Signed)
Shift Note: 1630 to 0030    ? Psychiatric Presentation (Mood, affect, milieu behaviors): Patient was received in the dayroom, participating in arts and crafts. She was mildly receptive to introduction and assessment. Pt appears internally preoccupied and has trouble following conversation with Clinical research associate. Often thought blocked and with latent responses. She has intermittently been present in the milieu and then spent time watching the ipad in her room. She is in NAD at this time and is able to make needs known. 15 minute rounds maintained for safety and security.     ? Safety Concerns: Patient endorsed SI and HI however denies any plan or intent and does not appear to understand assessment questions.       Mental Status Exam  Appearance: Disheveled  Relationship to Interviewer: Cooperative, Guarded  Psychomotor Activity: Normal  Abnormal Movements: None  Muscle Strength and Tone: Normal  Station/Gait : Normal  Speech : Regular rate, Normal tone  Language: Fluent  Mood: Dysphoric  Affect: Appropriate  Thought Process: Goal-directed  Thought Content: Paranoia, Suicidal ideation, Homicidal ideation  Perceptions/Associations : No hallucinations  Sensorium: Alert  Cognition: Fair attention span  [employer] of Knowledge: Normal  Insight : Fair  Judgement: Fair     Problem: Risk for Suicide  Goal: Patient Will Be Safe And Free From Injury Throughout Hospitalization  Description: This goal applies for the duration of hospitalization  Outcome: Maintaining     Problem: Risk for Violence  Goal: Patient Will Identify 2 Alternative Ways Of Dealing With Stress And Emotional Problems By Day 3  Description: Patient is to IDENTIFY healthy coping skills.  For example, talking with staff or significant others  Outcome: Maintaining

## 2022-04-16 NOTE — Plan of Care (Signed)
Shift Note: 1230 to 0030    . Psychiatric Presentation (Mood, affect, milieu behaviors): Patient was received in her room resting with even, unlabored respirations. She was mildly receptive to introduction and assessment. Pt continues to focus on belongings and needs multiple reminders that she has numerous pairs of clothes already in her room. She is noted to have a dysphoric mood and affect. Pt is in NAD at this time and is able to make needs known. 15 minute rounds maintained for safety and security.     . Safety Concerns: No SIB, elopement attempts or violent behaviors observed.     . Pain: No complaints of pain this shift.     . Pt is performing ADLs: Independently;     . Pt-reported quality of sleep: Restful    . Internal stimuli: Patient denies    . Observed response to scheduled medications: Patient has been compliant with all scheduled medications this shift, without issue. No signs or symptoms of adverse reaction or EPS observed.     Problem: Risk for Suicide  Goal: Patient Will Be Safe And Free From Injury Throughout Hospitalization  Description: This goal applies for the duration of hospitalization  Outcome: Maintaining     Problem: Risk for Violence  Goal: Patient Will Identify 2 Alternative Ways Of Dealing With Stress And Emotional Problems By Day 3  Description: Patient is to IDENTIFY healthy coping skills.  For example, talking with staff or significant others  Outcome: Maintaining

## 2022-04-16 NOTE — Progress Notes (Signed)
Psych Social Work Progress Note  Chart reviewed and Discussed patient with treatment team    Contact with: Patient and Inpatient team/staff (MD,NP, Nurse, Psych Tech)    Contact type: Rounding, Treatment Planning and [facility] secure E-Mail    Purpose of Contact: Continue assessment    Narrative: Met with patient today. She is agreeable with [facility] placement. [facility] with [facility] that [facility] is not on the "not suitable" list, however now [facility] beds at this time.    Next Steps: Continue to address identified Psychosocial Risk Factors     Donna Bernard, LMSW    Penelope Coop, LMSW  Pager ID [phone]

## 2022-04-16 NOTE — Progress Notes (Signed)
Inpatient Psychiatry  Progress Note   Length of Stay: 2 days    Significant Interval Events:Alexis Vang has been in good behavioral control, and had no acute events overnight. Alexis Vang was non-adherent with her scheduled medications risperidone, and did not utilize her PRN medications.    Subjective:   Alexis Vang was seen and examined this morning by the treatment team. Patient said that she was inappropriately diagnosed with schizophrenia and has never heard voices are seen things that weren't there. She did say that she hear the voices of demons and spirits which often distracted and bothered her. But she said that they wouldn't be so rude as to interrupt the team talking to her and they would have to go through her secretary to speak with her because she was at the top of the "totem pole". She also reported that she continues to want to kill the CPS worker "who took her baby away." Alexis Vang is agreeable with the plan and had no other questions or concerns for the team at this time.     Review of Systems   Constitutional: Negative for chills and fever.   HENT: Negative for congestion and hearing loss.    Eyes: Negative for blurred vision and double vision.   Respiratory: Negative for shortness of breath.    Cardiovascular: Negative for chest pain.   Gastrointestinal: Negative for constipation and diarrhea.   Genitourinary: Negative for dysuria.   Musculoskeletal: Negative for myalgias.   Skin: Negative for rash.   Neurological: Negative for headaches.      Objective:   Vitals:  BP 117/75 (BP Location: Left arm)   Pulse 90   Temp 36.2 C (97.2 F) (Infrared)   Resp 14   Ht 1.702 m (5\' 7" )   Wt 65.8 kg (145 lb)   SpO2 99%   BMI 22.71 kg/m   Wt Readings from Last 1 Encounters:   04/12/22 65.8 kg (145 lb)      Mental Status Exam:  Appearance: Appears younger than stated age, Disheveled and Dressed in hospital attire  Attitude/Behavior: Cooperative  Motor Activity: No Abnormal Movements  Eye Contact: Direct, Darting  and Room Scanning  Speech: Normal rhythm, Normal amount, Normal tone and Rapid  Mood: "good"  Affect: Full Range  Thought Process: Illogical, Perseverative and Disorganized   Thought Content: Persecutory Delusions  Perception: Auditory Hallucinations  Current Suicidal Ideation: Denies  Current Homicidal Ideation: Endorses homicidal ideation and with intent (to kill CPS worker who took her child away)  Concentration: Poor  Cognition:  Intact  Judgement: Poor  Insight: Poor    Physical Exam:  General: Appears stated age  Respiratory: Normal effort  Skin: No rashes or wounds  Neurologic: Alert, Awake    Current Medications:  . escitalopram  10 mg Oral Daily   . triamcinolone   Topical 2 times per day   . multivitamin with minerals-trace elements  1 tablet Oral Daily   . risperiDONE  1 mg Oral 2 times per day   . melatonin  3 mg Oral Daily @ 1800           . OLANZapine  5 mg Oral BID PRN   . acetaminophen  650 mg Oral Q4H PRN   . aluminum & magnesium hydroxide w/simethicone  30 mL Oral Q8H PRN   . magnesium hydroxide  30 mL Oral Daily PRN   . hydrOXYzine HCl  50 mg Oral TID PRN       Pertinent Labs:    Labs:  No New Labs     Impression:   Alexis Vang is a 31 y.o. female with a past history reportedly of schizophrenia, unspecified mood disorder, PTSD and ADHD who presented to the hospital voluntarily  with suicidal ideation and homicidal ideation in the context of active substance use, housing instabiltiy and medication/treatment nonadherence.      Today, Alexis Vang presents as continuing to express bizarre thought content, and disorganized on interview today.  Patient intermittently accepting of medications.  Will continue the Risperdal 1mg  BID for now, and will consider an increase tomorrow.    Alexis Vang will benefit from continued admission for stabilization, medication management, and safe discharge planning.     Psychiatric Diagnostic Impression:   Active Problems:    Psychosis, unspecified psychosis type    Plan:    Psychosis, NOS  - Risperdal 1mg  BID  - Zyprexa 5mg  BID PRN for agitation    - Lexapro 10mg  daily continued    IM Recommendations: Haldol 5 mg, Ativan 2 mg, Benadryl 50 mg IM  Precautions: Escape Precautions, Violence Precautions and Suicide Precautions  Legal Status: 9.39 (expires 04/25/22)    Reason(s) for Continued Stay: Unable to care for self in a less structured environment  Discharge Plan:  Pending further psychiatric hospitalization    Esker Dever Lesli Albee, MD, PhD  Psychiatry PGY-2  04/16/2022 at 4:22 PM

## 2022-04-16 NOTE — Student Note (Signed)
Medical Student Psychiatry Inpatient Daily Progress Note    LOS: 2 days    Interim Events:   -NAOE   -Agreeable to Lexapro and Risperdal this am  -No PRN zyprexa  -Took PRN 50mg  hydroxyzine yesterday evening    Subjective:   Patient has intermittently been present in the milieu, participated in arts and crafts activity. Patient continues to decline risperdal, but repeatedly told nursing that she wants "something to help me focus".     During conversation this morning, patient was appropriately dressed and eager to speak with medical team. Patient continues to show extremely poor insight into her situation, believes that she is here for "housing" and a poor attention span, states that once she gets medication to improve her attention she is going to go back to school "for anything" and then get housing. Patient shared that she met with her social worker and legal aid yesterday and that the only barrier to getting her son back is housing. Patient states that everything was fine during her pregnancy and that she was doing well at [facility], despite numerous documented incidents throughout her pregnancy, including failure to participate in activities at [facility], multiple positive utox screens, and numerous presentations to [facility].     When asked about her previous diagnosis of schizophrenia, patient stated that she does not have schizophrenia because she experiences no visual or auditory hallucinations. When pressed about time of her diagnosis, patient endorsed frequently speaking with Molly Maduro, an evil demon spirit, as well as several other spirits, including Lisa. Patient does not seem distressed about being able to communicate with the evil spirits, in fact says that it is a "gift" to be able to control them. Patient states that she chooses when she communicates with the spirits, and that she has a Chief Strategy Officer" who deals with them because she is at "the top of the totem pole". At the end of the conversation patient shared  that her son is actually "a fourteen-year-old" who "returned to the womb" for mischievous purposes. Patient firm that she is not angry at her son and sees him now "as a two-month-old" who needs her help.    Patient ended conversation by again asking for something to help with her attention, is willing to take something new, not risperdal.      Review of systems:  New symptoms: None  Physical complaints: Hand pain/puritis, given topical kenalog ointment    Vitals:  Resp:  [16-18]      Mental Status Exam:  Appearance: Appropriately dressed, good hygiene/personal care  Attitude/Behavior: Conversational, engages appropriately with medical team  Motor Activity: Normal  Muscle Tone: Normal  Eye Contact: Intermittent  Language: Fluent/comprehensible  Speech: Normal cadence at times becoming pressured  Mood: "I'm doing what I need to do"  Affect: Pleasant, engaging  Thought Process: Disorganized, tangential  Thought Content: Endorses HI, preoccupied with witchcraft/voodoo  Perception: Endorses auditory hallucinations of demon spirits/witchcraft, delusions about son and CPS workers  Orientation: Oriented to person, place, time  Cognition: Poor attention span, difficult to maintain fluid conversation  Insight: Extremely poor  Judgement: Poor    Pertinent Labs/EKG/Imaging:   -Negative for HBV, HCV, HIV    Current Medications:  Scheduled Meds:  . escitalopram  10 mg Oral Daily   . triamcinolone   Topical 2 times per day   . multivitamin with minerals-trace elements  1 tablet Oral Daily   . risperiDONE  1 mg Oral 2 times per day   . melatonin  3 mg  Oral Daily @ 1800     PRN Meds:  . OLANZapine  5 mg Oral BID PRN   . acetaminophen  650 mg Oral Q4H PRN   . aluminum & magnesium hydroxide w/simethicone  30 mL Oral Q8H PRN   . magnesium hydroxide  30 mL Oral Daily PRN   . hydrOXYzine HCl  50 mg Oral TID PRN       Assessment:  [name] Kill is a 31 y.o. female 31 years old PP with unclear psychiatric history and SUD who voluntary  presented to CPEP due to SI/HI in the setting of active substance use and housing instability. Her current overwhelming stressor is the placement of her newborn son into foster care, related to her active substance use during pregnancy and lack of psychiatric care. Since her son's placement, patient left [facility] program and is now experiencing housing instability, staying with various people where the patient does not feel safe. Her urine toxin screen was positive for cocaine and fetanyl upon admission. Patient has poor insight into her situation, believes that her child was taken away for nefarious reasons by CPS caseworker, who she endorses feeling homicidal thoughts towards. She was obviously responding to internal stimuli during the therapeutic conversation, and her thought content was quite disorganized with a very poor attention span. Patient repeatedly brought up her ability to perform witchcraft and to put voodoo spells on those who harm her. She also shared that she frequently communicates with evil spirits, but is able to decided when she listens to them or not. Regarding the positive tox screen, the patient was insistent that this is due to strangers "shooting her up", and that she has never voluntarily taken drugs.     Differential at this time remains broad, in light of the patient's unclear psychiatric and medical history, lack of collateral information, and patient's limited ability to engage in conversation at this time. It does appear that patient has had at least 2 prior hospitalizations for psychotic symptoms, so this appears to be an enduring pattern and not an isolated event. Patient reported a history of a diagnosis of schizophrenia, which is certainly plausible given her current presentation. A psychosubstance- induced psychosis cannot be ruled out at this time given the patient's tox screen and quick improvement since presenting to CPEP several days ago. Given that the patient is  recently post-partum, a diagnosis of post-partum psychosis cannot be ruled out, although it appears that the patient had enduring delusions throughout her pregnancy, making this diagnosis less likely.      Plan:    Psychiatric- Psychosis, NOS  -Continue Risperdal 1mg  BID (patient intermittently refusing)   -Continue Zyprexa 5mg  BID PRN   -Continue home Lexapro 10 mg daily     Medical  -Topical kenalog ointment for hand pruritus    Safety  IM Recommendations: Haldol 5 mg, Ativan 2 mg, Benadryl 50 mg IM  Legal Status: 9.39 (expires 4/28)  Precautions: Escape Precautions, Violence Precautions and Suicide Precautions    Reasons for Continued Stay: Suicidal/homicidal    Disposition:  Pending clinical course, SW placement    This is a Psychologist, occupational note. Please see resident/attending note for the final assessment and plan.    Author: Su Grand, Medical Student as of: 04/16/2022  at: 7:55 AM

## 2022-04-16 NOTE — Plan of Care (Signed)
Shift Note: 0800 to 1100    . Psychiatric Presentation (Mood, affect, milieu behaviors): Presents as euthymic mood with appropriate affect. Denies SI/HI at first, then after assessing for AVH, she states "I could hurt myself or others but I don't hear voices or see anything". Able to contract for safety. Latent speech. Good PO intake. Compliant with all scheduled medications but looked closely at each medication package. [name] asked what her risperidone was for which Clinical research associate provided education and she accepted it. Observed present in the milieu interacting well with others. Able to make needs known. Reports menstrual pain 10/10. Offered and accepted prn tylenol having positive effect.     . Safety Concerns: denies    . Pain: denies    . Pt is performing ADLs: Independently;     . Pt-reported quality of sleep: Restful    . Internal stimuli: Patient denies    . Observed response to scheduled medications: accepting and tolerating    PRN Use:   Precipitating Behavior/Complaint PRN Med Administration Effect   Menstrual pain 10/10 tylenol Offered and Given positive     Mental Status Exam  Appearance: Disheveled  Relationship to Interviewer: Cooperative, Guarded  Psychomotor Activity: Normal  Abnormal Movements: None  Muscle Strength and Tone: Normal  Station/Gait : Normal  Speech : Regular rate  Language: Fluent  Mood: Euthymic  Affect: Appropriate  Thought Process: Goal-directed  Thought Content: Paranoia, No suicidal ideation, No homicidal ideation  Perceptions/Associations : No hallucinations  Sensorium: Alert  Cognition: Fair attention span  Progress [employer] of Knowledge: Normal  Insight : Fair  Judgement: Fair     Problem: Risk for Suicide  Goal: Patient Will Be Safe And Free From Injury Throughout Hospitalization  Description: This goal applies for the duration of hospitalization  Outcome: Maintaining     Problem: Risk for Violence  Goal: Patient Will Identify 2 Alternative Ways Of Dealing With Stress And Emotional Problems By  Day 3  Description: Patient is to IDENTIFY healthy coping skills.  For example, talking with staff or significant others  Outcome: Maintaining

## 2022-04-17 MED ORDER — RISPERIDONE 2 MG PO TBDP *I*
2.0000 mg | ORAL_TABLET | Freq: Two times a day (BID) | ORAL | Status: DC
Start: 2022-04-17 — End: 2022-04-18
  Filled 2022-04-17 (×2): qty 1

## 2022-04-17 NOTE — Plan of Care (Signed)
Shift Note: 0800 to 2030    . Psychiatric Presentation (Mood, affect, milieu behaviors): Presents as euthymic mood with appropriate affect. Denies SI. Endorses HI stating "I want to hurt someone that's outside of the hospital". Declines to elaborate on who this is. Denies AVH. Compliant with scheduled medications but declined Risperdal stating "I don't need that. I don't hear voices or anything so I don't need that". Writer offered education which was not accepted. Reports menstrual pain 7/10. Offered and accepted prn tylenol having positive effect. Observed present in the milieu making limited interactions with others this morning. Came up to nurses station later this morning reporting anxiety. Offered and given hydroxyzine having positive effect.bserved in her bathroom this evening looking in the mirror talking to herself stating "Thank you, thank you. Thank you." Observed out in the milieu later this afternoon singing and playing with peers in the dining hall. Able to make needs known. Reports menstrual pain again this evening. Offered and given tylenol having positive effect.    . Safety Concerns: denies    . Pain: denies    . Pt is performing ADLs: Independently;     . Pt-reported quality of sleep: Restful    . Internal stimuli: Patient denies    . Observed response to scheduled medications: accepting and tolerating    . If uncooperative with scheduled medications: risperidone  o # times offered: 2  o Strategies used for encouraging med adherence and effectiveness: medication education and offering food/drink    PRN Use:   Precipitating Behavior/Complaint PRN Med Administration Effect   Menstrual pain tylenol Offered and Given positive   anxiety hydroxyzine Offered and Given positive     Mental Status Exam  Appearance: Disheveled  Relationship to Interviewer: Cooperative, Guarded  Psychomotor Activity: Normal  Abnormal Movements: None  Muscle Strength and Tone: Normal  Station/Gait : Normal  Speech :  Poverty  Language: Fluent  Mood: Euthymic  Affect: Appropriate  Thought Process: Goal-directed  Thought Content: Paranoia, No suicidal ideation, No homicidal ideation  Perceptions/Associations : No hallucinations  Sensorium: Alert  Cognition: Fair attention span  Progress Energy of Knowledge: Normal  Insight : Fair  Judgement: Fair     Problem: Risk for Suicide  Goal: Patient Will Be Safe And Free From Injury Throughout Hospitalization  Description: This goal applies for the duration of hospitalization  Outcome: Maintaining     Problem: Risk for Violence  Goal: Patient Will Identify 2 Alternative Ways Of Dealing With Stress And Emotional Problems By Day 3  Description: Patient is to IDENTIFY healthy coping skills.  For example, talking with staff or significant others  Outcome: Maintaining

## 2022-04-17 NOTE — Plan of Care (Signed)
Problem: Risk for Suicide  Goal: Patient Will Be Safe And Free From Injury Throughout Hospitalization  Description: This goal applies for the duration of hospitalization  04/17/2022 2237 by Adaline Sill, Gaylan Gerold, RN  Outcome: Maintaining      Problem: Risk for Violence  Goal: Patient Will Identify 2 Alternative Ways Of Dealing With Stress And Emotional Problems By Day 3  Description: Patient is to IDENTIFY healthy coping skills.  For example, talking with staff or significant others  04/17/2022 2237 by Adaline Sill, Gaylan Gerold, RN  Outcome: Maintaining    Shift Note: 2000 to 0030    . Psychiatric Presentation (Mood, affect, milieu behaviors): Pt in the milieu but with minimal interactions with peers. Pt was able to make needs known to staff. Pt requested to take shower and was directed to using hallway shower. She presents with euthymic mood and appropriate affect. Denies SI/HI. Denies AV/H but is paranoid. She refused risperidone 2 mg. Security and safety checks Q 15 min.     . Safety Concerns: Suicide, escape, violenece    . Pain: Denies    . Pt is performing ADLs: Independently;     . Pt-reported quality of sleep: Restful    . Internal stimuli: Patient denies    . Observed response to scheduled medications: Pt refused rispiridone    . If uncooperative with scheduled medications:  o # times offered: 2x  o Strategies used for encouraging med adherence and effectiveness: medication education    PRN Use:   Precipitating Behavior/Complaint PRN Med Administration Effect       Mental Status Exam  Appearance: Disheveled  Relationship to Interviewer: Eye contact limited, Guarded  Psychomotor Activity: Normal  Abnormal Movements: None  Muscle Strength and Tone: Normal  Station/Gait : Normal  Speech : Regular rate  Language: Fluent  Mood: Dysphoric  Affect: Appropriate  Thought Process: Blocking  Thought Content: No suicidal ideation, No homicidal ideation  Perceptions/Associations : Auditory hallucinations  Sensorium:  Alert  Cognition: Fair attention span  Progress Energy of Knowledge: Normal  Insight : Poor  Judgement: Poor

## 2022-04-17 NOTE — Progress Notes (Signed)
Inpatient Psychiatry  Progress Note   Length of Stay: 3 days    Significant Interval Events:Alexis Vang has been in good behavioral control, and had no acute events overnight. Alexis Vang was adherent with her scheduled medications, and did not utilize her PRN medications.    Subjective:   Alexis Vang was seen and examined this morning by the treatment team.     Patient is lying in bed with a blanket over her.  Patient shares that she is feeling tired today, but otherwise okay.  She continues to express some homicidal ideation against CPS workers, though declined to specify a specific name.  She does share that she has thought about this, and when pressed, she states that she has placed a "root", a Voodoo curse against people.  When asked, she does state that she would advise the team not to discuss, as it would usually be wise to avoid a discussion regarding Voodoo.  She does make statements regarding some individuals, including Alexis Vang and Alexis Vang, who keeps doing bad things.  She did make statements regarding an unspecified Insurance claims handler.  She shares that she did take the Risperdal today, though again continued to advocate for discussion regarding ADHD medications.  Discussed that we would continue the Risperdal for now.      Alexis Vang is agreeable with the plan and had no other questions or concerns for the team at this time.     Review of Systems   Constitutional: Negative for diaphoresis.   HENT: Negative for hearing loss.    Respiratory: Negative for cough.    Psychiatric/Behavioral: Positive for hallucinations.      Objective:   Vitals:  BP 117/75 (BP Location: Left arm)   Pulse 90   Temp 36.2 C (97.2 F) (Infrared)   Resp 16   Ht 1.702 m (5\' 7" )   Wt 65.8 kg (145 lb)   SpO2 99%   BMI 22.71 kg/m   Wt Readings from Last 1 Encounters:   04/12/22 65.8 kg (145 lb)      Mental Status Exam:  Appearance: Appears stated age and Dressed in hospital attire  Attitude/Behavior: Cooperative  Motor Activity: No Abnormal  Movements  Eye Contact: Direct  Speech: Normal rhythm and Soft  Mood: "Tired"  Affect: Restricted and Dysphoric  Thought Process: More organized, and more linear than previous interviews  Thought Content: Persecutory and religious themes present  Perception: No overt evidence of responding to internal stimuli today  Current Suicidal Ideation: Denies  Current Homicidal Ideation: Endorses homicidal ideation, though with a vague plan regarding voodoo  Concentration: Poor  Cognition:  Intact  Judgement: Poor  Insight: Poor    Physical Exam:  General: Appears stated age, NAD  Respiratory: Normal effort  Skin: Dry  Neurologic: Alert, Awake    Current Medications:  . risperiDONE  2 mg Oral 2 times per day   . escitalopram  10 mg Oral Daily   . triamcinolone   Topical 2 times per day   . multivitamin with minerals-trace elements  1 tablet Oral Daily   . melatonin  3 mg Oral Daily @ 1800           . OLANZapine  5 mg Oral BID PRN   . acetaminophen  650 mg Oral Q4H PRN   . aluminum & magnesium hydroxide w/simethicone  30 mL Oral Q8H PRN   . magnesium hydroxide  30 mL Oral Daily PRN   . hydrOXYzine HCl  50 mg Oral TID PRN  Pertinent Labs:    Labs:  No results found for this or any previous visit (from the past 24 hour(s)).       Impression:   Alexis Vang Hooveris a 30 y.o.femalewith a past history reportedly of schizophrenia, unspecified mood disorder, PTSD and ADHDwho presented to the hospital voluntarilywith suicidal ideation and homicidal ideationin the context of active substance use, housing instabiltiy and medication/treatment nonadherence.     Today, Alexis Vang continues to discuss persecutory and voodoo related themes.  However, patient does appear to be making some gains with the Risperdal.  Patient does appear to be more linear in her discussion with the team.  Patient also denying any acute safety concerns, and appears not to have any specific thoughts of harming any individuals, though continues to discuss  putting spells to curse CPS workers.  Patient has been taking the Risperdal 1mg  BID, and given the response, will plan to increase further to 2mg  BID starting tonight.  Alexis Vang will benefit from continued admission for stabilization, medication management, and safe discharge planning.     Psychiatric Diagnostic Impression:   Active Problems:    Psychosis, unspecified psychosis type    Plan:   Psychosis, NOS  - Increase Risperdal to 2mg  BID  - Zyprexa 5mg  BID PRN for agitation    - Lexapro 10mg  daily continued    IM Recommendations: Haldol 5 mg, Ativan 2 mg, Benadryl 50 mg IM  Precautions: Escape Precautions, Violence Precautions and Suicide Precautions  Legal Status: 9.39 (expires 04/25/22)    Reason(s) for Continued Stay: Unable to care for self in a less structured environment  Discharge Plan:  Pending further psychiatric stabilization    Alexis Lorenzo Lesli Albee, MD, PhD   Psychiatry - PGY 2  04/17/22 at 2:21 PM
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
## 2022-04-18 DIAGNOSIS — Z62810 Personal history of physical and sexual abuse in childhood: Secondary | ICD-10-CM

## 2022-04-18 LAB — EKG 12-LEAD
P: 53 deg
PR: 153 ms
QRS: 70 deg
QRSD: 82 ms
QT: 393 ms
QTc: 459 ms
Rate: 82 {beats}/min
T: 111 deg

## 2022-04-18 MED ORDER — ESCITALOPRAM OXALATE 20 MG PO TABS *I*
20.0000 mg | ORAL_TABLET | Freq: Every day | ORAL | Status: DC
Start: 2022-04-19 — End: 2022-04-22
  Administered 2022-04-19 – 2022-04-22 (×4): 20 mg via ORAL
  Filled 2022-04-18 (×4): qty 1
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
## 2022-04-18 NOTE — Progress Notes (Signed)
Psych Social Work Progress Note  Chart reviewed and Discussed patient with treatment team    Contact with: Writer and Inpatient team/staff (MD,NP, Nurse, Psych Tech)    Contact type: Holiday representative    Purpose of Contact: Continue assessment    Narrative: Received call from Harriett Rush, SW that works with patient's attorney.  She said that patient has Family Court on May 5.  Misty Stanley said she and patient's attorney visited this week.  Misty Stanley is keeping her CPS worker updated.  The CPS worker is Estil Daft and she may be Field seismologist for an update.  WE will be in contact with CPS and Misty Stanley throughout admission as patient has consented to update at this time.     Next Steps: Continue to address identified Psychosocial Risk Factors     Donna Bernard, LMSW     Penelope Coop, LMSW  Pager ID [phone]

## 2022-04-19 NOTE — Plan of Care (Signed)
Problem: Risk for Suicide  Goal: Patient Will Be Safe And Free From Injury Throughout Hospitalization  Description: This goal applies for the duration of hospitalization  Outcome: Progressing towards goal     Problem: Risk for Violence  Goal: Patient Will Identify 2 Alternative Ways Of Dealing With Stress And Emotional Problems By Day 3  Description: Patient is to IDENTIFY healthy coping skills.  For example, talking with staff or significant others  Outcome: Progressing towards goal     0800-1630: Patient presents with euthymic mood and affect. She came to the desk independently to make needs known. Requested supplies to shower. Med compliant. Social with peers. No safety concerns were voiced or gestured.

## 2022-04-19 NOTE — Plan of Care (Signed)
Shift Note: 2000 to 0030    Psychiatric Presentation (Mood, affect, milieu behaviors): Pt is received resting in her bed with light off. She responds to name being spoken. She is friendly, calm and cooperative, denies current SI, HI, anxiety and depression. Pt reports " feeling drained of energy" for reason for resting in bed early. No negative or aggressive behaviors reported or observed. Will continue to monitor for safety.    Safety Concerns: no safety concerns    Pain: 0/10    Pt is performing ADLs: Independently;   If not independent, comment:     Pt-reported quality of sleep: Restful    Internal stimuli: Patient denies    Observed response to scheduled medications: no negative side effects observed or reported    If uncooperative with scheduled medications:  # times offered:   Strategies used for encouraging med adherence and effectiveness:       Mental Status Exam  Appearance: Appropriately dressed  Relationship to Interviewer: Cooperative  Psychomotor Activity: Normal  Abnormal Movements: None  Muscle Strength and Tone: Normal  Station/Gait : Normal  Speech : Regular rate, Normal tone, Normal rhythm  Language: Normal comprehension, Normal repetition  Mood: Euthymic  Affect: Appropriate  Thought Process: Goal-directed  Thought Content: No suicidal ideation, No homicidal ideation  Perceptions/Associations : No hallucinations  Sensorium: Alert  Cognition: Recent memory intact, Remote memory intact, Fair attention span  Progress Energy of Knowledge: Normal  Insight : Fair  Judgement: Fair     Problem: Risk for Suicide  Goal: Patient Will Be Safe And Free From Injury Throughout Hospitalization  Description: This goal applies for the duration of hospitalization  Outcome: Maintaining     Problem: Risk for Violence  Goal: Patient Will Identify 2 Alternative Ways Of Dealing With Stress And Emotional Problems By Day 3  Description: Patient is to IDENTIFY healthy coping skills.  For example, talking with staff or significant  others  Outcome: Maintaining

## 2022-04-20 NOTE — Plan of Care (Addendum)
Shift Note: 08:00-20:30    . Psychiatric Presentation (Mood, affect, milieu behaviors): Pt presents as friendly and cooperative with RN care and activities. Euthymic mood and inappropriate affect. When asked, endorsed HI towards CPS. Stating, with a smile on her face and a pleasant tone, "I'm going to shoot them with a gun." Stated she does not own a gun but "I'm going to get one and shoot them down. Don't tell anyone." No other safety concerns voiced or gestured.     . Safety Concerns: No safety concerns voiced or gestured.     . Pain: Denies    . Pt is performing ADLs: Independently;   o If not independent, comment: Shower    . Observed response to scheduled medications: Effective       PRN Use:   Precipitating Behavior/Complaint PRN Med Administration Effect   C/o generalized pain Tylenol Asked and given  Awaiting                    Mental Status Exam  Appearance: Appropriately dressed  Relationship to Interviewer: Cooperative, Eye contact good, Friendly  Psychomotor Activity: Normal  Abnormal Movements: None  Muscle Strength and Tone: Normal  Station/Gait : Normal  Speech : Regular rate, Normal tone, Normal rhythm, Normal amount  Language: Fluent, Normal comprehension  Mood: Euthymic  Affect: Appropriate  Thought Process: Sequential  Thought Content: No suicidal ideation, No homicidal ideation  Perceptions/Associations : No hallucinations  Sensorium: Alert  Cognition: Recent memory intact, Fair attention span  Progress Energy of Knowledge: Normal  Insight : Fair  Judgement: Fair             Problem: Risk for Suicide  Goal: Patient Will Be Safe And Free From Injury Throughout Hospitalization  Description: This goal applies for the duration of hospitalization  Outcome: Progressing towards goal     Problem: Risk for Violence  Goal: Patient Will Identify 2 Alternative Ways Of Dealing With Stress And Emotional Problems By Day 3  Description: Patient is to IDENTIFY healthy coping skills.  For example, talking with staff or  significant others  Outcome: Progressing towards goal

## 2022-04-20 NOTE — Plan of Care (Addendum)
Shift Note: 2000 to 0030    . Psychiatric Presentation (Mood, affect, milieu behaviors): Mood expressed as Excited"; affect is labile; alert;  denies suicidal ideation; when asked if Pt is having homicidal thoughts, Pt responded "CPS should die... they kidnap children"; thought process sequential; internally preoccupied; reportedly observed talking to herself; bizarre behavior observed, e.g. was reportedly observed standing by the mirror in the washroom for a very long time while talking to herself; when writer approached Pt who had reportedly been in the washroom for a long time, Pt insisted on being given more lotion; limit setting and redirection had to be done by Clinical research associate and another staff;  Pt became irritable and agitated when a co-patient was heard screaming; Pt was redirected; judgment and insight poor.    . Safety Concerns: no major safety concerns    . Pain: No pain concerns voiced    . Pt is performing ADLs: Independently;     . Internal stimuli: Related behaviors: Please refer to the above notes    . Observed response to scheduled medications: no adverse reactions    PRN Use:   Precipitating Behavior/Complaint PRN Med Administration Effect   anxiety Atarax 50 mg Requested With minimal effect   Agitation and psychosis Zyprexa 5 mg Offered With good effect     Mental Status Exam  Appearance: Appropriately dressed  Relationship to Interviewer: Cooperative, Eye contact good  Psychomotor Activity: Normal  Abnormal Movements: None  Muscle Strength and Tone: Normal  Station/Gait : Normal  Speech : Regular rate, Normal tone, Normal rhythm  Language: Fluent  Mood: Euthymic  Affect: Appropriate  Thought Process: Sequential  Thought Content: No suicidal ideation  Perceptions/Associations : No hallucinations  Sensorium: Alert  Cognition: Recent memory intact  Fund of Knowledge: Normal  Insight : Fair  Judgement: Fair     Problem: Risk for Suicide  Goal: Patient Will Be Safe And Free From Injury Throughout  Hospitalization  Description: This goal applies for the duration of hospitalization  Outcome: Maintaining     Problem: Risk for Violence  Goal: Patient Will Identify 2 Alternative Ways Of Dealing With Stress And Emotional Problems By Day 3  Description: Patient is to IDENTIFY healthy coping skills.  For example, talking with staff or significant others  Outcome: Maintaining

## 2022-04-21 MED ORDER — RISPERIDONE 2 MG PO TBDP *I*
2.0000 mg | ORAL_TABLET | Freq: Two times a day (BID) | ORAL | Status: DC
Start: 2022-04-21 — End: 2022-05-02
  Administered 2022-04-24 – 2022-05-02 (×12): 2 mg via ORAL
  Filled 2022-04-21 (×15): qty 1

## 2022-04-21 NOTE — Plan of Care (Signed)
Shift Note: 1230 to 0030    . Psychiatric Presentation (Mood, affect, milieu behaviors): Patient presents with elevated mood with reactive affect. At beginning of writer's shift, patient was seclusive to room and was observed to be speaking aloud to self in the bathroom and shower. Patient came out of room wearing a very small tank top and making inappropriate comments to female staff such as "I want to make you have sex with me" and "you're short but handsome." Testing limits and quickly approached unit door while a visitor was being let out and staff needed to stand in front of her to prevent her from leaving. Requested hydroxyzine for anxiety and accepted PRN hydroxyzine and zyprexa, due to increased agitation, with moderate effect. Declined scheduled Risperdal despite education stating "no thank you I do not want that." Accepted all other scheduled meds. Spent the evening in her room watching movies on the unit iPad. No acute safety concerns at this time. Q15 minute staff rounding maintained for safety.      Mental Status Exam  Appearance: Appropriately dressed  Relationship to Interviewer: Intrusive, Flirtatious  Psychomotor Activity: Normal  Abnormal Movements: None  Muscle Strength and Tone: Normal  Station/Gait : Normal  Speech : Regular rate, Normal tone, Normal rhythm  Language: Fluent, Normal comprehension  Mood: Elevated  Affect: Reactive  Thought Process: Goal-directed  Thought Content: No suicidal ideation  Perceptions/Associations : No hallucinations  Sensorium: Alert  Cognition: Recent memory intact  Fund of Knowledge: Normal  Insight : Fair  Judgement: Fair      Problem: Risk for Violence  Goal: Patient Will Identify 2 Alternative Ways Of Dealing With Stress And Emotional Problems By Day 3  Description: Patient is to IDENTIFY healthy coping skills.  For example, talking with staff or significant others  Outcome: Progressing towards goal     Problem: Risk for Suicide  Goal: Patient Will Be Safe And  Free From Injury Throughout Hospitalization  Description: This goal applies for the duration of hospitalization  Outcome: Maintaining

## 2022-04-21 NOTE — Progress Notes (Signed)
Inpatient Psychiatry  Progress Note   Length of Stay: 7 days    Significant Interval Events:Alexis Vang has been in good behavioral control, and had no acute events overnight. Alexis Vang was adherent with her scheduled medications, and did utilize atarax as a PRN medication for anxiety and Zyprexa for psychosis symptoms    Subjective:   Alexis Vang was seen and examined this morning by the treatment team.     Patient seen and evaluated with medical student.  When knocking on the door, patient requests some time to put on clothes, though this writer could overhear patient apparently talking to herself.      Patient states that she is "miserable" here in the hospital.  She shares that she would like to be discharged from the hospital, so that she may be able to go back to school.  When asked about further details, she shares that she will go to [employer], and sign up for a GED class, and that she would live on the grass.  She shares that she again brought herself to the hospital, because she wanted to harm CPS workers.  She does let slip that she believed these individuals were actually imposters wearing "CPS Outfits" and lying.  Patient then switches topics to the [facility], and how she is hopeful to go there.  Patient then states that she wants to have "positive energy".  When confronted about her earlier plans, she does mention that she still has homicidal ideation towards CPS workers.  When asked about plans, she initially denies, though shares that she would not want to share.  She then does make statements about getting close to her with "Something" and using "it".  Patient then shares about how she would discuss with [employer], and bring these individuals to the edge of space.      Patient does ask questions about medications.  She states that she continues to require adderall or vyvanse, and she shares that she had utilized it in the past.  When confronted that nobody had prescribed these to her, she makes  statements about how easy it is to obtain on certain street corners, and that she was a "drug kingpin", and did not bring them into the hospital.  She then asks if we are the police.  Patient also states that she would like her Lexapro dose increased to 100mg  daily, because that is what she was prescribed in the past.  Did discuss other medication options, though states that she had been on [place] before, as well as Zyprexa.      Did discuss the possibility of CD treatment, patient declining stating that she does not have a substance use problem.      Alexis Vang is agreeable with the plan and had no other questions or concerns for the team at this time.     Review of Systems   Constitutional: Negative for diaphoresis.   HENT: Negative for hearing loss.    Respiratory: Negative for cough.    Psychiatric/Behavioral: The patient does not have insomnia.       Objective:   Vitals:  BP 106/64   Pulse 82   Temp 36.3 C (97.3 F) (Infrared)   Resp 16   Ht 1.702 m (5\' 7" )   Wt 65.8 kg (145 lb)   SpO2 93%   BMI 22.71 kg/m   Wt Readings from Last 1 Encounters:   04/12/22 65.8 kg (145 lb)      Mental Status Exam:  Appearance: Appears stated  age and Dressed in Associate Professor  Attitude/Behavior: Cooperative  Motor Activity: No Abnormal Movements  Eye Contact: Indirect  Speech: Somewhat increased rate  Mood: "I'm good"  Affect: Somewhat irritable, anxious  Thought Process: Tangential and Disorganized   Thought Content: Grandiose Delusions and Paranoid Delusions  Perception: Appears to be Responding to Internal Stimuli actively during interview  Current Suicidal Ideation: Denies  Current Homicidal Ideation: Endorses homicidal ideation with plan, though not specific about details regarding plan  Concentration: Poor  Cognition:  Intact  Judgement: Poor  Insight: Poor    Physical Exam:  General: Appears stated age, NAD  Respiratory: Normal effort  Skin: Dry  Neurologic: Alert, Awake    Current Medications:  .  escitalopram  20 mg Oral Daily   . triamcinolone   Topical 2 times per day   . multivitamin with minerals-trace elements  1 tablet Oral Daily   . melatonin  3 mg Oral Daily @ 1800           . OLANZapine  5 mg Oral BID PRN   . acetaminophen  650 mg Oral Q4H PRN   . aluminum & magnesium hydroxide w/simethicone  30 mL Oral Q8H PRN   . magnesium hydroxide  30 mL Oral Daily PRN   . hydrOXYzine HCl  50 mg Oral TID PRN       Pertinent Labs:    Labs:  No results found for this or any previous visit (from the past 24 hour(s)).       Impression:   Alexis Vang a 31 y.o.femalewith a past history reportedly of schizophrenia, unspecified mood disorder, PTSD and ADHDwho presented to the hospital voluntarilywith suicidal ideation and homicidal ideationin the context of active substance use, housing instabiltiy and medication/treatment nonadherence.    Today, Alexis Vang presents as having worsening psychotic symptoms today.  Patient with bizarre paranoid and grandiose delusions today, and patient appearing to be actively responding to internal stimuli during interview.  Patient with an overall disorganized thought process.  Patient now without antipsychotics since late last week, and appears to have regressed.  At this point, continuing to endorse homicidal ideation in response to paranoia.  Will re-order Risperdal at a dose of 2mg  tonight and plan to monitor  Given this persistent homicidal ideation in response to paranoia, will consider [facility] over objection if patient not complaint with medications.    Alexis Vang will benefit from continued admission for stabilization, medication management, and safe discharge planning.     Psychiatric Diagnostic Impression:   Active Problems:    Psychosis, unspecified psychosis type    Plan:   Psychosis, NOS vs. PTSD  -Resume Risperdal 2mg  BID  - Zyprexa 5mg  BID PRN for agitation  - Maintain Lexapro 20mg  daily    IM Recommendations: Haldol 5 mg, Ativan 2 mg, Benadryl 50 mg  IM  Precautions: Escape Precautions, Violence Precautions and Suicide Precautions  Legal Status: 9.39 (expires 04/25/22)    Reason(s) for Continued Stay: Unable to care for self in a less structured environment  Discharge Plan:  Pending further psychiatric stabilization    Alexis Heemstra Lesli Albee, MD, PhD   Psychiatry - PGY 2  04/21/22 at 5:25 PM

## 2022-04-21 NOTE — Progress Notes (Signed)
PSYCHOLOGY NOTE    Writer and interpreter made two attempts to meet with [name] in her room for individual therapy - once at 88 and the second at 1pm. On both occasions, [name] was observed to be sleeping and was groggy upon waking. Writer introduced himself, explained his role, and checked in about her wellbeing. She expressed that she was feeling tired and asked if they could try therapy when the writer returns on Wednesday. Writer honored her request and agreed to reschedule the session.    Hortense Ramal, M.A.  Clinical Psychology Intern

## 2022-04-21 NOTE — Student Note (Signed)
Medical Student Psychiatry Inpatient Daily Progress Note    LOS: 7 days    Interim Events:   -Endorsed HI to nursing towards CPS, stated "I'm going to shoot them with a gun". Stated she does not own a gun but "I'm going to get one and shoot them down. Don't tell anyone."  -PRN use of atarax and zyprexa    Subjective:   This afternoon, patient answered door wearing only bra and no shirt. Patient directed to put a shirt on before medical team would enter room. Patient notified team that she was fully clothed and team entered room. Patient stated that she is "miserable" still being here and hoping to go today. Patient continues to ask for "something to help with attention" despite team explaining that stimulants are not prescribed on the unit per hospital policy. Patient was offered several other antipsychotics, all of which patient refused due to negative prior experiences. Patient continues to endorse HI towards CPS workers and that she has a plan and we would see her "on the news", however she would not disclose her plan to [place]. Patient stated that "CPS workers were not real CPS workers, they were just wearing CPS outfits". She later said that she had "contacts at [employer]" who would strap them to a rocket. Patient stated that if she were to be discharged today she would go to [facility] to enroll in classes and "camp out in the grass" or go to the [place]. When asked if she would be willing to go to a chemical dependency program patient vehemently denied her need, as she "does not inject drugs". Patient did report that she has bought pills off the street in the past ("I'm a drug king pin"), mostly aderall and vyvanse. When patient was asked about her positive urine tox screens during pregnancy patient called Clinical research associate "a liar".    Review of systems:  New symptoms: None  Physical complaints: Hand pain/puritis, given topical kenalog ointment    Vitals:  BP: (108)/(72)   Temp:  [35.7 C (96.3 F)]   Temp src: Infrared (04/23  0843)  Heart Rate:  [91]   Resp:  [16]   SpO2:  [99 %]      Mental Status Exam:  Appearance: Freshly showered and groomed. Initially without shirt, but eventually able to be directed to put shirt on  Attitude/Behavior: Conversational  Motor Activity: Increased motor activity including foot tapping and hand wringing  Muscle Tone: Normal  Eye Contact: Minimal  Language: Fluent/comprehensible  Speech: Pressured, increased rate  Mood: "I'm miserable"  Affect: Euthymic  Thought Process: More disorganized that previously, difficult to follow in conversation  Thought Content: Endorses HI, no talk of voodoo/witchcraft  Perception: Speaking to others while alone in room before medical team entered  Orientation: Oriented to person, place, time  Cognition: Intact  Insight: Extremely Poor  Judgement: Poor    Pertinent Labs/EKG/Imaging:   -Negative for HBV, HCV, HIV    Current Medications:  Scheduled Meds:  . escitalopram  20 mg Oral Daily   . triamcinolone   Topical 2 times per day   . multivitamin with minerals-trace elements  1 tablet Oral Daily   . melatonin  3 mg Oral Daily @ 1800     PRN Meds:  . OLANZapine  5 mg Oral BID PRN   . acetaminophen  650 mg Oral Q4H PRN   . aluminum & magnesium hydroxide w/simethicone  30 mL Oral Q8H PRN   . magnesium hydroxide  30 mL Oral  Daily PRN   . hydrOXYzine HCl  50 mg Oral TID PRN       Assessment:  Alexis Vang is a 31 y.o. female 1 month PP with unclear psychiatric history and SUD who voluntary presented to CPEP due to SI/HI in the setting of active substance use and housing instability. Her current overwhelming stressor is the placement of her newborn son into foster care, related to her active substance use during pregnancy and lack of psychiatric care. Since her son's placement, patient left [facility] program and is now experiencing housing instability, staying with various people where the patient does not feel safe. Her urine toxin screen was positive for cocaine and fetanyl  upon admission. Patient has poor insight into her situation, believes that her child was taken away for nefarious reasons by CPS caseworker, who she endorses feeling homicidal thoughts towards. She was obviously responding to internal stimuli during the therapeutic conversation, and her thought content was quite disorganized with a very poor attention span. Patient repeatedly brought up her ability to perform witchcraft and to put voodoo spells on those who harm her. She also shared that she frequently communicates with evil spirits, but is able to decided when she listens to them or not. Regarding the positive tox screen, the patient was insistent that this is due to strangers "shooting her up", and that she has never voluntarily taken drugs.    Differential at this time remains broad, in light of the patient's unclear psychiatric and medical history, lack of collateral information, and patient's limited ability to engage in conversation at this time. It does appear that patient has had at least 2 prior hospitalizations for psychotic symptoms, so this appears to be an enduring pattern and not an isolated event. Patient reported a history of a diagnosis of schizophrenia, which is certainly plausible given her current presentation. A psychosubstance- induced psychosis cannot be ruled out at this time given the patient's tox screen and quick improvement since presenting to CPEP several days ago. Given that the patient is recently post-partum, a diagnosis of post-partum psychosis cannot be ruled out, although it appears that the patient had enduring delusions throughout her pregnancy, making this diagnosis less likely.    Updated Assessment:    Patient initially showed mild improvement during this hospitalization, but has somewhat decompensated since Risperdal was discontinued last week. Patient has refused other offered antipsychotics. She has been able to engage positively with both staff members and fellow  patients. The patient has no acute inpatient safety concerns, but she continues to discuss witchcraft/voodoo and putting spells on CPS workers. Patient additionally has endorsed HI with plan (buying a gun) to nursing. Patient will benefit from continued admission for stabilization, medication management, and safe discharge planning.      Plan:    Psychiatric- Psychosis, NOS    -Continue Zyprexa 5mg  BID PRN   -Continue home Lexapro 10 mg daily     Medical  -Topical kenalog ointment for hand pruritus    Safety  IM Recommendations: Haldol 5 mg, Ativan 2 mg, Benadryl 50 mg IM  Legal Status: 9.39 (expires 4/28)  Precautions: Escape Precautions, Violence Precautions and Suicide Precautions    Reasons for Continued Stay: Suicidal/homicidal    Disposition:  Pending clinical course, SW placement    This is a Psychologist, occupational note. Please see resident/attending note for the final assessment and plan.    Author: Su Grand, Medical Student as of: 04/21/2022  at: 8:01 AM

## 2022-04-21 NOTE — Plan of Care (Signed)
Problem: Risk for Suicide  Goal: Patient Will Be Safe And Free From Injury Throughout Hospitalization  Description: This goal applies for the duration of hospitalization  Outcome: Maintaining     Problem: Risk for Violence  Goal: Patient Will Identify 2 Alternative Ways Of Dealing With Stress And Emotional Problems By Day 3  Description: Patient is to IDENTIFY healthy coping skills.  For example, talking with staff or significant others  Outcome: Maintaining     Shift Note: 0830 to 1230    . Psychiatric Presentation (Mood, affect, milieu behaviors): Presents as euthymic with appropriate affect. Seclusive to room this morning, sleeping. Pt awoke easily for AM meds and took without issue. Pt returned to sleep. Writer walked Cabin crew to pts bedroom, initally pt was agreeable to talk with intern, later asked if they could reschedule. Pt asleep since.     . Safety Concerns: No safety concerns voiced or gestured.     . Pain: Denies    . Pt is performing ADLs: Independently;     . Pt-reported quality of sleep: Restful    . Internal stimuli: Patient denies    . Observed response to scheduled medications: Moderate    Mental Status Exam  Appearance: Appropriately dressed  Relationship to Interviewer: Cooperative, Friendly  Psychomotor Activity: Normal  Abnormal Movements: None  Muscle Strength and Tone: Normal  Station/Gait : Normal  Speech : Regular rate, Normal tone, Normal rhythm, Normal amount  Language: Fluent, Normal repetition, Normal comprehension  Mood: Euthymic  Affect: Appropriate  Thought Process: Goal-directed, Sequential  Thought Content: No suicidal ideation, No homicidal ideation  Perceptions/Associations : No hallucinations  Sensorium: Alert  Cognition: Recent memory intact  Fund of Knowledge: Normal  Insight : Fair  Judgement: Fair

## 2022-04-22 MED ORDER — SULFAMETHOXAZOLE-TRIMETHOPRIM 800-160 MG PO TABS *I*
1.0000 | ORAL_TABLET | Freq: Two times a day (BID) | ORAL | Status: AC
Start: 2022-04-22 — End: 2022-04-29
  Administered 2022-04-22 – 2022-04-29 (×14): 1 via ORAL
  Filled 2022-04-22 (×14): qty 1

## 2022-04-22 MED ORDER — SULFAMETHOXAZOLE-TRIMETHOPRIM 800-160 MG PO TABS *I*
1.0000 | ORAL_TABLET | Freq: Two times a day (BID) | ORAL | Status: DC
Start: 2022-04-22 — End: 2022-04-22

## 2022-04-22 MED ORDER — ESCITALOPRAM OXALATE 10 MG PO TABS *I*
10.0000 mg | ORAL_TABLET | Freq: Every day | ORAL | Status: DC
Start: 2022-04-23 — End: 2022-04-24
  Administered 2022-04-23 – 2022-04-24 (×2): 10 mg via ORAL
  Filled 2022-04-22 (×2): qty 1

## 2022-04-22 NOTE — Student Note (Signed)
Medical Student Psychiatry Inpatient Daily Progress Note    LOS: 8 days    Interim Events:   -Made inappropriate comments to staff including "I want to make you have sex with me" and "You're short but handsome" while wearing a very small tank top   -Testing limits of unit; tried to approach unit door while a visitor was being let out and staff needed to stand in front of her to prevent her from leaving  -Psych intern attempted to see patient twice and both times she said she was too tired, asked to try again on Wednesday  -Accepted PRN hydroxyzine and zyprexa    Subjective:   This morning, patient awake in room. Patient demonstrated moderate irritability, some emotional lability. Patient frustrated with still being inpatient, repeatedly endorsed wanting to be discharged. Patient stated that she felt that she was letting her baby down by not hurting the CPS workers, and today expressed new HI towards her baby's foster parents. Stated that she will keep them safe for now, but they will be "burried" if they do not give her son back to her at the permenancy hearing in October. Patient stated that she has decided that she will not accept any more medications, including zyprexa.     Team shared concerns about patient refusing medications and that she continues to express SI/HI. Team shared that they will be scheduling a [place] hearing with a neutral, third party judge for both patient and medical team to share their thoughts.     Review of systems:  New symptoms: None  Physical complaints: Hand pain/puritis, given topical kenalog ointment    Vitals:  BP: (106)/(64)   Temp:  [36.3 C (97.3 F)]   Temp src: Infrared (04/24 0900)  Heart Rate:  [82]   Resp:  [16]   SpO2:  [93 %]      Mental Status Exam:  Appearance: Somewhat disheveled  Attitude/Behavior: Able to be engaged in conversation  Motor Activity: Increased motor activity including foot tapping and hand wringing  Muscle Tone: Normal  Eye Contact: Moderate  Language:  Fluent/comprehensible  Speech: Pressured, increased rate  Mood: "I'm upset"  Affect: Irritable, moderately labile  Thought Process: More disorganized that previously, difficult to follow in conversation  Thought Content: Endorses HI, no talk of voodoo/witchcraft  Perception: Responding to internal stimuli  Orientation: Oriented to person, place, time  Cognition: Intact  Insight: Extremely Poor  Judgement: Poor    Pertinent Labs/EKG/Imaging:   -Negative for HBV, HCV, HIV    Current Medications:  Scheduled Meds:  . risperiDONE  2 mg Oral 2 times per day   . escitalopram  20 mg Oral Daily   . triamcinolone   Topical 2 times per day   . multivitamin with minerals-trace elements  1 tablet Oral Daily   . melatonin  3 mg Oral Daily @ 1800     PRN Meds:  . OLANZapine  5 mg Oral BID PRN   . acetaminophen  650 mg Oral Q4H PRN   . aluminum & magnesium hydroxide w/simethicone  30 mL Oral Q8H PRN   . magnesium hydroxide  30 mL Oral Daily PRN   . hydrOXYzine HCl  50 mg Oral TID PRN       Assessment:  Alexis Vang is a 31 y.o. female 1 month PP with unclear psychiatric history and SUD who voluntary presented to CPEP due to SI/HI in the setting of active substance use and housing instability. Her current overwhelming stressor is the  placement of her newborn son into foster care, related to her active substance use during pregnancy and lack of psychiatric care. Since her son's placement, patient left [facility] program and is now experiencing housing instability, staying with various people where the patient does not feel safe. Her urine toxin screen was positive for cocaine and fetanyl upon admission. Patient has poor insight into her situation, believes that her child was taken away for nefarious reasons by CPS caseworker, who she endorses feeling homicidal thoughts towards. She was obviously responding to internal stimuli during the therapeutic conversation, and her thought content was quite disorganized with a very poor  attention span. Patient repeatedly brought up her ability to perform witchcraft and to put voodoo spells on those who harm her. She also shared that she frequently communicates with evil spirits, but is able to decided when she listens to them or not. Regarding the positive tox screen, the patient was insistent that this is due to strangers "shooting her up", and that she has never voluntarily taken drugs.    Differential at this time remains broad, in light of the patient's unclear psychiatric and medical history, lack of collateral information, and patient's limited ability to engage in conversation at this time. It does appear that patient has had at least 2 prior hospitalizations for psychotic symptoms, so this appears to be an enduring pattern and not an isolated event. Patient reported a history of a diagnosis of schizophrenia, which is certainly plausible given her current presentation. A psychosubstance- induced psychosis cannot be ruled out at this time given the patient's tox screen and quick improvement since presenting to CPEP several days ago. Given that the patient is recently post-partum, a diagnosis of post-partum psychosis cannot be ruled out, although it appears that the patient had enduring delusions throughout her pregnancy, making this diagnosis less likely.    Updated Daily Assessment:    Patient initially showed mild improvement during this hospitalization, but has somewhat decompensated since Risperdal was discontinued last week. Patient has refused other offered antipsychotics. The patient has no acute inpatient safety concerns, but she continues to discuss witchcraft/voodoo and putting spells on CPS workers and now her son's foster parents. Patient additionally has endorsed HI with plan (buying a gun) to nursing. Patient has additionally demonstrated hypersexual behavior and comments towards nursing staff, concerned that lexapro is contributing to escalation of behavior. Team has made  decision to schedule a [place] hearing for medication against patient objection. Patient will benefit from continued admission for stabilization, medication management, and safe discharge planning.      Plan:    Psychiatric- Psychosis, NOS  -Schedule [place] hearing  -Continue Zyprexa 5mg  BID PRN  (refusing)  -Decrease lexapro to 10 mg, intention to taper medication    Medical  -Topical kenalog ointment for hand pruritus    Safety  IM Recommendations: Haldol 5 mg, Ativan 2 mg, Benadryl 50 mg IM  Legal Status: 9.39 (expires 4/28)  Precautions: Escape Precautions, Violence Precautions and Suicide Precautions    Reasons for Continued Stay: Suicidal/homicidal    Disposition:  Pending clinical course, SW placement    This is a Psychologist, occupational note. Please see resident/attending note for the final assessment and plan.    Author: Su Grand, Medical Student as of: 04/22/2022  at: 7:51 AM

## 2022-04-22 NOTE — Plan of Care (Signed)
Shift Note: 1230 to 2030    . Psychiatric Presentation (Mood, affect, milieu behaviors): Pt presents as euthymic with appropriate affect. Present in milieu interacting well with others. C/o "tumors" under both arms, stating they have been there for about a week and she has never had them before. Writer assessed a palpable, visible growth under each arm. MIPS paged and assessed, see antibiotic order. Requested and received PRN Tylenol for bilateral underarm pain rated 10/10, awaiting effect. Refused scheduled Kenalog and Risperdal, stating "Next time you take out the trash take all the Risperdal too." Accepting of other scheduled medications.     Mental Status Exam  Appearance: Appropriately dressed, Groomed  Relationship to Interviewer: Cooperative  Psychomotor Activity: Increased  Abnormal Movements: None  Muscle Strength and Tone: Normal  Station/Gait : Normal  Speech : Interruptible, Pressured, Other (high pitch)  Language: Fluent, Normal comprehension  Mood: Euthymic  Affect: Appropriate  Thought Process: Goal-directed  Thought Content: No suicidal ideation, No homicidal ideation  Perceptions/Associations : No hallucinations  Sensorium: Alert  Cognition: Fair attention span  Progress Energy of Knowledge: Normal  Insight : Poor  Judgement: Poor     Problem: Risk for Violence  Goal: Patient Will Identify 2 Alternative Ways Of Dealing With Stress And Emotional Problems By Day 3  Description: Patient is to IDENTIFY healthy coping skills.  For example, talking with staff or significant others  Outcome: Progressing towards goal     Problem: Risk for Suicide  Goal: Patient Will Be Safe And Free From Injury Throughout Hospitalization  Description: This goal applies for the duration of hospitalization  Outcome: Maintaining

## 2022-04-22 NOTE — Progress Notes (Signed)
Medicine Consult to Psychiatry       Patient's subjective complaint:  "tumors under my arms"    Notified by nursing staff, patient with abscesses under bilateral axilla. Pt states she started noting the abscesses about 1 week ago. Reports mild discomfort.  No hx of these before per patient or hx of HS. Patient denies any CP, SOB, HA, NVD, or any URI symptoms.     Objective data     Physical Exam : 2 abscesses noted under bilateral axilla- fluctuant- R>L.  Not draining currently.  No surrounding erythema. No streaking noted. Vitals are stable    Patient Vitals for the past 72 hrs:   BP Temp Temp src Pulse Resp SpO2   04/22/22 0918 119/81 36.1 C (97 F) Infrared 84 14 99 %   04/22/22 0500 -- -- -- -- 16 --   04/21/22 0900 106/64 36.3 C (97.3 F) Infrared 82 16 93 %   04/20/22 0843 108/72 35.7 C (96.3 F) Infrared 91 16 99 %   04/19/22 2100 -- -- -- -- 16 --            Recent Laboratory Results            Lab results: 04/11/22  2341   WBC 5.3   Hemoglobin 12.3   Hematocrit 40   RBC 4.1   Platelets 356               Lab results: 04/11/22  2341   Sodium 138   Potassium 4.0   Chloride 103   CO2 19*   UN 19   Creatinine 0.84   Glucose 77   Calcium 8.9                Lab results: 08/19/21  1011   TSH 0.59         Assessment & Plan     This is a 31 y.o. yo female, MIPS was called to address abscesses. The patient appears in NA and afebrile. MIPS will continue to follow.     Recommendations:   1.  Apply warm compresses three times/day or more if tolerated to try and get them to self drain.  2. Will start Bactrim.  3. If the abscesses continue to get bigger, develop surrounding erythema, she becomes febrile please call us to re-evaluate as we may need to get surgery involved  to drain them.    Follow-up     Please page MIPS [phone]) with any questions or concerns

## 2022-04-22 NOTE — Plan of Care (Signed)
Problem: Risk for Suicide  Goal: Patient Will Be Safe And Free From Injury Throughout Hospitalization  Description: This goal applies for the duration of hospitalization  Outcome: Maintaining     Problem: Risk for Violence  Goal: Patient Will Identify 2 Alternative Ways Of Dealing With Stress And Emotional Problems By Day 3  Description: Patient is to IDENTIFY healthy coping skills.  For example, talking with staff or significant others  Outcome: Progressing towards goal     Shift Note: 0830 to 1230    . Psychiatric Presentation (Mood, affect, milieu behaviors): Presents as elevated with appropriate affect. Pt more energetic this shift with Clinical research associate. Pt has pressured speech and will not really engage with Clinical research associate. Answers question with minimal answer. Accepted morning medications except for Risperdal. When writer asked why she did not want this medication she stated "It stinks". Writer tried to ask further questions about this and pt would change subject. Pt completed ADLs. Approached nurse's station and said "I have a tumor". Pt then raised both arms and pointed to growth on both underarms. Pt reports "it hurts a little when I mess with it", denies warmth or other concerns. Writer and resident assessed, MIPS to be paged and assess.     . Safety Concerns: Denies    . Pain: Denies     . Pt is performing ADLs: Independently;    . Pt-reported quality of sleep: Restful    . Internal stimuli: Patient denies    . Observed response to scheduled medications: Minimal

## 2022-04-22 NOTE — Plan of Care (Signed)
Shift Note: 2030 to 0030    Psychiatric Presentation (Mood, affect, milieu behaviors): "Throw the Risperdal in the trash." Presents with anxious mood, congruent affect. Pt exhibits childlike mannerisms/ high speech tones, etc. Intrusive with peers at intervals. Pt requested to speak with the chaplain, and he came to the unit to visit. Pt intrusive, as she attempted to arrange for the chaplain to visit with another random peer as well. Pt initially refused Melatonin, but did take it later. She requested and received Tylenol for pain to underarm cysts which she described as a "10" on a scale of 0-10. Will monitor for effectiveness.     Safety Concerns: none at this time  Pain: Reports pain to underarm cysts of "10" on scale of 0-10  Pt is performing ADLs: Independently and Without prompting;   Internal stimuli: Patient denies  Observed response to scheduled medications: Refused Risperdal, took Melatonin late. No adverse effects noted.     PRN Use:   Precipitating Behavior/Complaint PRN Med Administration Effect   Bilateral axillary cyst pain Tylenol Requested and Given Monitoring for effectiveness                   Mental Status Exam  Appearance: Groomed, Appropriately dressed  Relationship to Interviewer: Cooperative, Friendly  Psychomotor Activity: Increased  Abnormal Movements: None  Muscle Strength and Tone: Normal  Station/Gait : Normal  Speech : Pressured, Interruptible  Language: Normal comprehension, Fluent, Normal repetition  Mood: Euthymic, Elevated  Affect: Inappropriate  Thought Process: Goal-directed  Thought Content: No suicidal ideation, No homicidal ideation  Perceptions/Associations : No hallucinations  Sensorium: Alert  Cognition: Recent memory intact  Fund of Knowledge: Normal  Insight : Poor  Judgement: Poor      Problem: Risk for Suicide  Goal: Patient Will Be Safe And Free From Injury Throughout Hospitalization  Description: This goal applies for the duration of hospitalization  Outcome:  Maintaining     Problem: Risk for Violence  Goal: Patient Will Identify 2 Alternative Ways Of Dealing With Stress And Emotional Problems By Day 3  Description: Patient is to IDENTIFY healthy coping skills.  For example, talking with staff or significant others  Outcome: Maintaining

## 2022-04-22 NOTE — Progress Notes (Addendum)
Inpatient Psychiatry  Progress Note   Length of Stay: 8 days    Significant Interval Events:Airi has been in good behavioral control, and had no acute events overnight. Alexis Vang was non-adherent with her scheduled medications, declining her scheduled Risperdal, and did utilize zyprexa and atarax as PRN medications for agitation and anxiety.    Subjective:   Alexis Vang was seen and examined this morning by the treatment team.     Alexis Vang states that she is feeling fine today, but that she got a bit agitated yesterday and took Zyprexa.  She shares that she does not need to take medications such as Zyprexa or Risperdal, and that she only needs to take medications for focus.  Reinforced that we would be unable to prescribe that here, and she states that she would be unable to trust anyone who did not provide her with the medication she feels she needs.      She does state that she continues to have homicidal ideation, and that she deserves to die if she was unable to hurt "those people". She states that she feels the physical pain of her son because "we're one person".  She states that she would do a lot of bad things, and cannot wait to be given the opportunity to do so.  She states that her son is under the care of Malen Gauze parents, and that they'll die in October.      Discussed the objections related to her mediations.  She simply states that she does not want it.  Informed her of our decision to bring her before a neutral third party. She became upset by this, and asks the team to leave.    Patient later approached this writer in the milieu to demand discharge.  Redirected with staff support.      Review of Systems   Constitutional: Negative for diaphoresis.   HENT: Negative for hearing loss.    Respiratory: Negative for cough.    Psychiatric/Behavioral: Positive for suicidal ideas.      Objective:   Vitals:  BP 106/64   Pulse 82   Temp 36.3 C (97.3 F) (Infrared)   Resp 16   Ht 1.702 m (5\' 7" )   Wt 65.8 kg  (145 lb)   SpO2 93%   BMI 22.71 kg/m   Wt Readings from Last 1 Encounters:   04/12/22 65.8 kg (145 lb)      Mental Status Exam:  Appearance: Appears stated age and Dressed in personal attire  Attitude/Behavior: Uncooperative and Suspicious  Motor Activity: No Abnormal Movements  Eye Contact: Indirect  Speech: Increased rate, increased amount, sometimes difficult to interrupt  Mood: "I'm good"  Affect: Irritable   Thought Process: Tangential and Disorganized   Thought Content: Continues to have paranoid delusions, and homicidal ideation  Perception: Appears to be Responding to Internal Stimuli  Current Suicidal Ideation: Endorses suicidal ideation  Current Homicidal Ideation: Endorses homicidal ideation  Concentration: Poor  Cognition:  Intact  Judgement: Poor  Insight: Poor    Physical Exam:  General: Appears stated age, NAD  Respiratory: Normal effort  Skin: Dry  Neurologic: Alert, Awake    Current Medications:  . risperiDONE  2 mg Oral 2 times per day   . escitalopram  20 mg Oral Daily   . triamcinolone   Topical 2 times per day   . multivitamin with minerals-trace elements  1 tablet Oral Daily   . melatonin  3 mg Oral Daily @ 1800           .  OLANZapine  5 mg Oral BID PRN   . acetaminophen  650 mg Oral Q4H PRN   . aluminum & magnesium hydroxide w/simethicone  30 mL Oral Q8H PRN   . magnesium hydroxide  30 mL Oral Daily PRN   . hydrOXYzine HCl  50 mg Oral TID PRN       Pertinent Labs:    Labs:  No results found for this or any previous visit (from the past 24 hour(s)).       Impression:   Alexis Vang Hooveris a 31 y.o.femalewith a past history reportedly of schizophrenia, unspecified mood disorder, PTSD and ADHDwho presented to the hospital voluntarilywith suicidal ideation and homicidal ideationin the context of active substance use, housing instabiltiy and medication/treatment nonadherence.    Overnight, patient described as hypersexual.  Today, Alexis Vang presents as having worsening symptoms still.   Patient continuing to have not only the paranoid delusions, and the homicidal ideation, she also now endorsing suicidal ideation.  These does appear to be rooted in paranoid delusions.  Given the concern for safety, will plan to pursue treatment over objection. Will also plan to decrease the Lexapro, as it may have made her symptoms worse.   Alexis Vang will benefit from continued admission for stabilization, medication management, and safe discharge planning.     Psychiatric Diagnostic Impression:   Active Problems:    Psychosis, unspecified psychosis type    Plan:   Psychosis, NOSvs. PTSD  -Risperdal 2mg  BID  - Zyprexa 5mg  BID PRN for agitation  - DecreaseLexapro to 10mg  daily    IM Recommendations: Haldol 5 mg, Ativan 2 mg, Benadryl 50 mg IM  Precautions: Escape Precautions, Violence Precautions and Suicide Precautions  Legal Status: 9.39 (expires 04/25/22)    Reason(s) for Continued Stay: Unable to care for self in a less structured environment  Discharge Plan:  Pending further psychiatric stabilization    Areon Cocuzza Lesli Albee, MD, PhD   Psychiatry - PGY 2  04/22/22 at 8:03 AM

## 2022-04-22 NOTE — Progress Notes (Signed)
SPIRITUAL ASSESSMENT NOTE    Writer paged to the unit to visit with the pt as requested. Pt's initial concern: "I want to make a new start" and learn "about church and prayer." According to pt, she has no hx of faith community connection(s). Prior to visiting, the pt asks the chaplain to wait while she cleans her room and then returns. Pt is very friendly, almost inappropriately. At the pt's request the writer offered some guidance on prayer, what it is, and where could she attend. Pt does not know where she will go after discharge. The pt offered the chaplain her artwork, asked to see the chaplain again, and requested a Bible.     Identified Religion: Spiritual not religious     Spiritual Concern: Overwhelmed, Fearful        Patient Coping: Anxious     Chaplain Interventions: Reflective listening, Prayer    Plan of Care:  Writer will follow-up on the pt's request for a Bible and have the Bible delivered to the unit.     Jason Coop      8:49 PM on 04/22/2022

## 2022-04-23 NOTE — Plan of Care (Signed)
Shift Note: 0800 to 1630    . Psychiatric Presentation (Mood, affect, milieu behaviors): Pt presents as euthymic and elevated with appropriate and reactive affect. Denies SI/HI/AVH and physical pain. Pt endorsed emotional pain stating, "I'm in love and I'm depressed because he doesn't love me back. I wish I was a man so I could legally rape him." Following female tech around unit and perseverative on wanting to talk and spend time with him. Also perseverative on her court date on May 5th wanting to speak with her doctor to make sure she gets there for it. Pt was able to switch out clothes from suitcase and put other clothes back that she didn't want. Took PRN PO hydroxyzine and Zyprexa with good effect. Remains in okay behavioral control and able to make needs known. Good PO intake. Present in milieu spending a lot of time on unit phone interacting well with peers. Adherent with scheduled medications.    Mental Status Exam  Appearance: Appropriately dressed  Relationship to Interviewer: Cooperative  Psychomotor Activity: Increased  Abnormal Movements: None  Muscle Strength and Tone: Normal  Station/Gait : Normal  Speech : Pressured, Interruptible  Language: Fluent, Normal comprehension  Mood: Euthymic, Elevated  Affect: Appropriate, Reactive  Thought Process: Goal-directed  Thought Content: No suicidal ideation, No homicidal ideation  Perceptions/Associations : No hallucinations  Sensorium: Alert  Cognition: Fair attention span  Progress Energy of Knowledge: Normal  Insight : Poor  Judgement: Poor     Problem: Risk for Violence  Goal: Patient Will Identify 2 Alternative Ways Of Dealing With Stress And Emotional Problems By Day 3  Description: Patient is to IDENTIFY healthy coping skills.  For example, talking with staff or significant others  Outcome: Progressing towards goal     Problem: Risk for Suicide  Goal: Patient Will Be Safe And Free From Injury Throughout Hospitalization  Description: This goal applies for the duration  of hospitalization  Outcome: Maintaining

## 2022-04-23 NOTE — Student Note (Signed)
Medical Student Psychiatry Inpatient Daily Progress Note    LOS: 9 days    Interim Events:   -Notified nursing staff of abscesses under bilateral axilla; MIPS consulted, suggestions included in plan  -Refused risperdal ("throw the risperdal in the trash")  -Asked to see hospital chaplain, acted somewhat innappropriately (see note)    Subjective:   This morning, team was notified that patient was very frustrated with still being here. Team went to talk to patient who was very agitated. Patient was standing up, hastily cramming oatmeal and breakfast sandwich into her mouth, not taking time to chew. When questioned if she was feeling extra hungry today, she stated "I'm pregnant". When asked about this statement, patient reported that she has been so down without her son that she decided to impregnate herself "through a spell".    Team shared intention to file a case before an impartial judge because she remains a danger and has been refusing medication. Patient was notified that she will have her own lawyer who will represent her. Patient was angered by this and team left room.    Later, patient approached treatment team in the milieu, sharing that she has decided that she will stay here because she needs a "refuge because I do have emotional problems". Patient asked [place] to make sure she is able to attend her CPS hearing on May 4th, SW aware.    Review of systems:  New symptoms: Bilateral abscesses under axilla  Physical complaints: Hand pain/puritis, given topical kenalog ointment    Vitals:  BP: (119)/(81)   Temp:  [36.1 C (97 F)]   Temp src: Infrared (04/25 0918)  Heart Rate:  [84]   Resp:  [14-18]   SpO2:  [99 %]      Mental Status Exam:  Appearance: Somewhat disheveled, fully dressed  Attitude/Behavior: Able to be engaged in conversation, somewhat aggressive  Motor Activity: Increased motor activity including foot tapping and hand wringing  Muscle Tone: Normal  Eye Contact: Intermittent  Language:  Fluent/comprehensible  Speech: Pressured, increased rate/tone/volume  Mood: "I'm mourning without my son"  Affect: Irritable, moderately labile  Thought Process: Disorganized, tangential  Thought Content: Endorses HI, states that she impregnated herself through a spell  Perception: Responding to internal stimuli  Orientation: Oriented to person, place, time  Cognition: Intact  Insight: Extremely Poor  Judgement: Poor    Pertinent Labs/EKG/Imaging:   -Negative for HBV, HCV, HIV    Current Medications:  Scheduled Meds:  . escitalopram  10 mg Oral Daily   . sulfamethoxazole-trimethoprim  1 tablet Oral 2 times per day   . risperiDONE  2 mg Oral 2 times per day   . triamcinolone   Topical 2 times per day   . multivitamin with minerals-trace elements  1 tablet Oral Daily   . melatonin  3 mg Oral Daily @ 1800     PRN Meds:  . OLANZapine  5 mg Oral BID PRN   . acetaminophen  650 mg Oral Q4H PRN   . aluminum & magnesium hydroxide w/simethicone  30 mL Oral Q8H PRN   . magnesium hydroxide  30 mL Oral Daily PRN   . hydrOXYzine HCl  50 mg Oral TID PRN       Assessment:  [name] is a 31 y.o. female 1 month PP with unclear psychiatric history and SUD who voluntary presented to [facility] due to SI/HI in the setting of active substance use and housing instability. Her current overwhelming stressor is the placement  of her newborn son into foster care, related to her active substance use during pregnancy and lack of psychiatric care. Since her son's placement, patient left [facility] program and is now experiencing housing instability, staying with various people where the patient does not feel safe. Her urine toxin screen was positive for cocaine and fetanyl upon admission. Patient has poor insight into her situation, believes that her child was taken away for nefarious reasons by CPS caseworker, who she endorses feeling homicidal thoughts towards. She was obviously responding to internal stimuli during the therapeutic  conversation, and her thought content was quite disorganized with a very poor attention span. Patient repeatedly brought up her ability to perform witchcraft and to put voodoo spells on those who harm her. She also shared that she frequently communicates with evil spirits, but is able to decided when she listens to them or not. Regarding the positive tox screen, the patient was insistent that this is due to strangers "shooting her up", and that she has never voluntarily taken drugs.    Differential at this time remains broad, in light of the patient's unclear psychiatric and medical history, lack of collateral information, and patient's limited ability to engage in conversation at this time. It does appear that patient has had at least 2 prior hospitalizations for psychotic symptoms, so this appears to be an enduring pattern and not an isolated event. Patient reported a history of a diagnosis of schizophrenia, which is certainly plausible given her current presentation. A psychosubstance- induced psychosis cannot be ruled out at this time given the patient's tox screen and quick improvement since presenting to [facility] several days ago. Given that the patient is recently post-partum, a diagnosis of post-partum psychosis cannot be ruled out, although it appears that the patient had enduring delusions throughout her pregnancy, making this diagnosis less likely.    Updated Daily Assessment:    Patient initially showed mild improvement during this hospitalization, but has somewhat decompensated since Risperdal was discontinued last week. Patient has refused other offered antipsychotics. The patient has no acute inpatient safety concerns, but she continues to discuss witchcraft/voodoo and putting spells on CPS workers and now her son's foster parents. Patient additionally has endorsed HI with plan (buying a gun) to nursing. Patient has additionally demonstrated hypersexual behavior and comments towards nursing staff,  concerned that lexapro is contributing to escalation of behavior. Team has made decision to schedule a [facility] hearing for medication against patient objection. Patient will benefit from continued admission for stabilization, medication management, and safe discharge planning.      Plan:    Psychiatric- Psychosis, NOS  -Schedule [facility] hearing  -Continue Zyprexa 5mg  BID PRN  (not taking)  -Decrease lexapro to 10 mg, intention to taper medication    Medical  -Topical kenalog ointment for hand pruritus  -Bilateral abscesses: MIPS consulted, thanks for recs    - Apply warm compresses three times/day or more if tolerated to try and get  them to self drain.   - Will start Bactrim.   - If the abscesses continue to get bigger, develop surrounding erythema,  she becomes febrile please call us to re-evaluate as we may need to get  surgery involved  to drain them.    Safety  IM Recommendations: Haldol 5 mg, Ativan 2 mg, Benadryl 50 mg IM  Legal Status: 9.39 (expires 4/28)  Precautions: Escape Precautions, Violence Precautions and Suicide Precautions    Reasons for Continued Stay: Suicidal/homicidal    Disposition:  Pending clinical course,  SW placement    This is a Psychologist, occupational note. Please see resident/attending note for the final assessment and plan.    Author: Su Grand, Medical Student as of: 04/23/2022  at: 8:12 AM

## 2022-04-23 NOTE — Progress Notes (Signed)
Inpatient Psychiatry  Progress Note   Length of Stay: 9 days    Significant Interval Events:[name] has been in good behavioral control, and had no acute events overnight. Alexis Vang was non-adherent with her scheduled medications, declining her Risperdal, and did utilize tylenol as a PRN medication for pain.    Subjective:   Alexis Vang was seen and examined this morning by the treatment team.     Patient was initially observed hastily eating her food, though is agreeable to interview with the team.  Patient states that she is feeling fine, but then shares that she is now newly pregnant.  Shares that she did this to herself with witchcraft.  Did discuss with her the plan to file for treatment over objection, and patient states that she is very upset regarding this initially.  Then she states that she is happy that we will go to court because she is certain that she will be cleared by the judge, and she can use this as further evidence that she is fit to have her child.  Continues to endorse homicidal ideation.      Alexis Vang is agreeable with the plan and had no other questions or concerns for the team at this time.     Review of Systems   Constitutional: Negative for diaphoresis.   HENT: Negative for hearing loss.    Respiratory: Negative for cough.    Psychiatric/Behavioral: The patient does not have insomnia.       Objective:   Vitals:  BP 119/81 (BP Location: Right arm)   Pulse 84   Temp 36.1 C (97 F) (Infrared)   Resp 18   Ht 1.702 m (5\' 7" )   Wt 65.8 kg (145 lb)   SpO2 99%   BMI 22.71 kg/m   Wt Readings from Last 1 Encounters:   04/12/22 65.8 kg (145 lb)      Mental Status Exam:  Appearance: Appears stated age and Dressed in Associate Professor, wearing revealing clothes  Attitude/Behavior: Uncooperative  Motor Activity: Increased  Eye Contact: Indirect  Speech: Loud, increased rate, amount  Mood: "I'm fine"  Affect: Elevated and Irritable   Thought Process: Disorganized   Thought Content: Persecutory Delusions  and Grandiose Delusions  Perception: Appears to be Responding to Internal Stimuli  Current Suicidal Ideation: Denies  Current Homicidal Ideation: Endorses homicidal ideation  Concentration: Poor  Cognition:  Intact  Judgement: Poor  Insight: Poor    Physical Exam:  General: Appears stated age, NAD  Respiratory: Normal effort  Skin: Dry  Neurologic: Alert, Awake    Current Medications:  . escitalopram  10 mg Oral Daily   . sulfamethoxazole-trimethoprim  1 tablet Oral 2 times per day   . risperiDONE  2 mg Oral 2 times per day   . triamcinolone   Topical 2 times per day   . multivitamin with minerals-trace elements  1 tablet Oral Daily   . melatonin  3 mg Oral Daily @ 1800           . OLANZapine  5 mg Oral BID PRN   . acetaminophen  650 mg Oral Q4H PRN   . aluminum & magnesium hydroxide w/simethicone  30 mL Oral Q8H PRN   . magnesium hydroxide  30 mL Oral Daily PRN   . hydrOXYzine HCl  50 mg Oral TID PRN       Pertinent Labs:    Labs:  No results found for this or any previous visit (from the past 24 hour(s)).  Impression:   Alexis Vang a 30 y.o.femalewith a past history reportedly of schizophrenia, unspecified mood disorder, PTSD and ADHDwho presented to the hospital voluntarilywith suicidal ideation and homicidal ideationin the context of active substance use, housing instabiltiy and medication/treatment nonadherence.    Today, Alexis Vang presents as continuing to be elevated.  Patient continuing to decline Risperdal.  At this point, patient is elevated, hypersexual, as well as continuing to endorse homicidal ideation, and endorsing bizarre thought content.  Will pursue with [facility] treatment over objection.  Alexis Vang will benefit from continued admission for stabilization, medication management, and safe discharge planning.     Psychiatric Diagnostic Impression:   Active Problems:    Psychosis, unspecified psychosis type    Plan:   Psychosis, NOSvs. PTSD  -Risperdal 2mg  BID  - Zyprexa 5mg  BID PRN for  agitation  - Lexapro 10mg  daily, will plan to decrease further.      IM Recommendations: Haldol 5 mg, Ativan 2 mg, Benadryl 50 mg IM  Precautions: Escape Precautions, Violence Precautions and Suicide Precautions  Legal Status: 9.39 (expires 04/25/22)    Reason(s) for Continued Stay: Unable to care for self in a less structured environment  Discharge Plan:  Pending further psychiatric stabilization    Alexis Verga Lesli Albee, MD, PhD   Psychiatry - PGY 2  04/23/22 at 8:14 AM

## 2022-04-24 NOTE — Plan of Care (Signed)
Shift Note: 2000 to 0030    Psychiatric Presentation (Mood, affect, milieu behaviors): Pt is received in the milieu where she is playing games with peers. She has a bright affect, social with peers and staff, pleasant, Denies current SI,HI, anxiety and depression.As Clinical research associate was attempting to offer evening medication pt states " Tell that bastard Dr Celene Skeen I am not taking anymore drugs. He is a drug dealer and I don't need them. All I need is to read a book or meditate. I am not taking any more drugs." She did consume night medications without incident, Pt continued to express anger over prescribed medications for approximately 10 minutes then went to her room. No additional issues or concerns were expressed.    Safety Concerns: escape, violence    Pain: 0/10    Pt is performing ADLs: Independently;   If not independent, comment:     Pt-reported quality of sleep: Restful    Internal stimuli: Patient denies    Observed response to scheduled medications: no negative side effects observed or reported    If uncooperative with scheduled medications:  # times offered: n/a  Strategies used for encouraging med adherence and effectiveness: n/a      Mental Status Exam  Appearance: Appropriately dressed  Relationship to Interviewer: Cooperative  Psychomotor Activity: Increased  Abnormal Movements: None  Muscle Strength and Tone: Normal  Station/Gait : Normal  Speech : Regular rate, Normal tone, Normal rhythm  Language: Fluent  Mood: Euthymic, Elevated  Affect: Appropriate, Reactive  Thought Process: Illogical, Tangential  Thought Content: No suicidal ideation, No homicidal ideation  Perceptions/Associations : No hallucinations  Sensorium: Alert  Cognition: Fair attention span  Progress Energy of Knowledge: Normal  Insight : Poor  Judgement: Poor

## 2022-04-24 NOTE — Plan of Care (Signed)
Shift Note: 0800 to 2030    Psychiatric Presentation (Mood, affect, milieu behaviors): Pt presents as euthymic, elevated, and expansive with appropriate and reactive affect. Denies SI/HI/AVH and pain. Pt elevated after discussion with treatment team regarding having a [facility] hearing. Pt then perseverative on not taking medications all day talking to her self loudly in hallway. Able to be verbally redirected with pt stating, "I know my coping skills, I can use those. I don't need to be sedated." Pt continue to perseverate throughout shift and agreed to take PRN hydroxyzine after writer education that it would help to calm her down. Remained under behavioral control throughout rest of shift and able to make needs known. Good PO intake.       Mental Status Exam  Appearance: Appropriately dressed  Relationship to Interviewer: Cooperative  Psychomotor Activity: Increased  Abnormal Movements: None  Muscle Strength and Tone: Normal  Station/Gait : Normal  Speech : Regular rate, Normal tone, Normal amount  Language: Fluent, Normal comprehension  Mood: Euthymic, Elevated, Expansive  Affect: Appropriate, Reactive  Thought Process: Illogical, Tangential  Thought Content: No suicidal ideation, No homicidal ideation  Perceptions/Associations : No hallucinations  Sensorium: Alert  Cognition: Fair attention span  Progress Energy of Knowledge: Normal  Insight : Poor  Judgement: Poor     Problem: Risk for Suicide  Goal: Patient Will Be Safe And Free From Injury Throughout Hospitalization  Description: This goal applies for the duration of hospitalization  Outcome: Maintaining     Problem: Risk for Violence  Goal: Patient Will Identify 2 Alternative Ways Of Dealing With Stress And Emotional Problems By Day 3  Description: Patient is to IDENTIFY healthy coping skills.  For example, talking with staff or significant others  Outcome: Maintaining

## 2022-04-24 NOTE — Plan of Care (Signed)
1600 to 0030  Patient relatively seclusive to room throughout shift. Accepting of HS melatonin and antibiotic, but not risperdal. Bed disheveled, sleeping with all of her belongings on top of her. No behavioral concerns, no inappropriate interactions with female staff, though minimal interaction with males occurred. Denies SI HI AVH.   Problem: Risk for Suicide  Goal: Patient Will Be Safe And Free From Injury Throughout Hospitalization  Description: This goal applies for the duration of hospitalization  Outcome: Maintaining     Problem: Risk for Violence  Goal: Patient Will Identify 2 Alternative Ways Of Dealing With Stress And Emotional Problems By Day 3  Description: Patient is to IDENTIFY healthy coping skills.  For example, talking with staff or significant others  Outcome: Progressing towards goal

## 2022-04-24 NOTE — Progress Notes (Signed)
Inpatient Psychiatry  Progress Note   Length of Stay: 10 days    Significant Interval Events:Alexis Vang has been in good behavioral control, and had no acute events overnight. Alexis Vang was non-adherent with her scheduled medications, declining her Risperdal, and did utilize zyprexa and atarax as a PRN medication for anxiety.    Subjective:   Alexis Vang was seen and examined this morning by the treatment team.     Patient does appear to be elevated, and readjusting items around in her room.  Patient shares that she is very upset with the team, as we have revealed to her that we are taking her to court.  She shares that we have not been doing anything to help her, except to offer pills that she does not feel like she needs.  Patient then states that she is very angry at the team since we decreased her Lexapro dose.  Patient then stated that she does not understand why we would need to go to court, and demanded an explanation of her diagnosis and her symptoms.  As we were discussing our concerns, patient demanded if we're not going to treat her correctly, that we leave the room.  She states that she only came in voluntarily because she was feeling suicidal, and could not understand why she could not leave.      Patient later found this Clinical research associate in the milieu and accused this Clinical research associate of being a Higher education careers adviser.       Review of Systems   Constitutional: Negative for diaphoresis.   HENT: Negative for hearing loss.    Respiratory: Negative for cough.    Psychiatric/Behavioral: The patient is nervous/anxious.       Objective:   Vitals:  BP (!) 132/100 (BP Location: Right arm) Comment: Nurse Notified  Pulse 105   Temp 36.4 C (97.5 F) (Infrared)   Resp 16   Ht 1.702 m (5\' 7" )   Wt 65.8 kg (145 lb)   SpO2 97%   BMI 22.71 kg/m   Wt Readings from Last 1 Encounters:   04/12/22 65.8 kg (145 lb)      Mental Status Exam:  Appearance: Appears stated age and Dressed in Associate Professor  Attitude/Behavior: Uncooperative and Hostile  Motor  Activity: Increased  Eye Contact: Direct  Speech: Pressured and Loud  Mood: "I'm fine"  Affect: Elevated and Irritable   Thought Process: Illogical, Tangential and Disorganized   Thought Content: Persecutory Delusions  Perception: Appears to be responding to internal stimuli  Current Suicidal Ideation: Denies  Current Homicidal Ideation: Did not discuss with patient today  Concentration: Poor  Cognition:  Intact  Judgement: Poor  Insight: Poor    Physical Exam:  General: Appears stated age, NAD  Respiratory: Normal effort  Skin: Dry  Neurologic: Alert, Awake    Current Medications:  . sulfamethoxazole-trimethoprim  1 tablet Oral 2 times per day   . risperiDONE  2 mg Oral 2 times per day   . triamcinolone   Topical 2 times per day   . multivitamin with minerals-trace elements  1 tablet Oral Daily   . melatonin  3 mg Oral Daily @ 1800           . OLANZapine  5 mg Oral BID PRN   . acetaminophen  650 mg Oral Q4H PRN   . aluminum & magnesium hydroxide w/simethicone  30 mL Oral Q8H PRN   . magnesium hydroxide  30 mL Oral Daily PRN   . hydrOXYzine HCl  50 mg  Oral TID PRN       Pertinent Labs:    Labs:  No results found for this or any previous visit (from the past 24 hour(s)).       Impression:   Alexis Vang a 30 y.o.femalewith a past history reportedly of schizophrenia, unspecified mood disorder, PTSD and ADHDwho presented to the hospital voluntarilywith suicidal ideation and homicidal ideationin the context of active substance use, housing instabiltiy and medication/treatment nonadherence.    Today, Alexis Vang presents as continuing to be very irritable, paranoid, and elevated.  Significant concern for mania, and will discontinue the Lexapro entirely today.  Patient continuing to have poor insight and judgement about her psychiatric illness.  Declining to take Risperdal, and unable to discuss rationally alternate medications.  Given this, will pursue the treatment over objection.  Alexis Vang will benefit from  continued admission for stabilization, medication management, and safe discharge planning.     Psychiatric Diagnostic Impression:   Active Problems:    Psychosis, unspecified psychosis type    Plan:   Psychosis, NOSvs. PTSD  -Risperdal 2mg  BID  - Zyprexa 5mg  BID PRN for agitation    IM Recommendations: Haldol 5 mg, Ativan 2 mg, Benadryl 50 mg IM  Precautions: Escape Precautions, Violence Precautions and Suicide Precautions  Legal Status: 9.39 (expires 04/25/22)    Reason(s) for Continued Stay: Unable to care for self in a less structured environment  Discharge Plan:  Pending further psychiatric stabilization    Alejandro Adcox Lesli Albee, MD, PhD   Psychiatry - PGY 2  04/24/22 at 12:27 PM

## 2022-04-24 NOTE — Student Note (Signed)
Medical Student Psychiatry Inpatient Daily Progress Note    LOS: 10 days    Interim Events:   -Per nursing, patient has been following a female tech around the unit, wanting his attention. Stated to nursing: "I'm in love and I'm depressed because he doesn't love me back. I wish I was a man so I could legally rape him."  -Continues to refuse risperdal  -Usage of PRN hydroxyzine and zyprexa yesterday    Subjective:   This morning, patient visibly agitated and frustrated with treatment team as they entered room. Patient repeatedly stating that she is ready to leave. Treatment team attempted to explain reasons for patient's continued admission, mainly that team believes that she is a danger to herself and others. Patient not receptive to attempts of team to discuss goals for admission. When attending attempted to discuss collective goal of patient reunification with son, patient accused attending of thinking inappropriate thoughts about touching her son.    Review of systems:  New symptoms:   Physical complaints: Hand pain/puritis, given topical kenalog ointment; bilateral abscesses under axilla, started on bactrim    Vitals:  BP: (132)/(100)   Temp:  [36.4 C (97.5 F)]   Temp src: Infrared (04/26 0916)  Heart Rate:  [105]   Resp:  [16]   SpO2:  [97 %]      Mental Status Exam:  Appearance: Appropriately dressed and groomed  Attitude/Behavior: Agitated, visibly frustrated and verbally aggressive  Motor Activity: Increased motor activity including foot tapping and hand wringing  Muscle Tone: Normal  Eye Contact: Intermittent  Language: Fluent/comprehensible  Speech: Pressured, increased rate/tone/volume  Mood: "I'm ready to go"  Affect: Irritable, labile  Thought Process: Disorganized, tangential  Thought Content: Endorses SI/HI,   Perception: Responding to internal stimuli  Orientation: Oriented to person, place, time  Cognition: Intact  Insight: Extremely Poor  Judgement: Poor    Pertinent Labs/EKG/Imaging:   -Negative  for HBV, HCV, HIV    Current Medications:  Scheduled Meds:  . escitalopram  10 mg Oral Daily   . sulfamethoxazole-trimethoprim  1 tablet Oral 2 times per day   . risperiDONE  2 mg Oral 2 times per day   . triamcinolone   Topical 2 times per day   . multivitamin with minerals-trace elements  1 tablet Oral Daily   . melatonin  3 mg Oral Daily @ 1800     PRN Meds:  . OLANZapine  5 mg Oral BID PRN   . acetaminophen  650 mg Oral Q4H PRN   . aluminum & magnesium hydroxide w/simethicone  30 mL Oral Q8H PRN   . magnesium hydroxide  30 mL Oral Daily PRN   . hydrOXYzine HCl  50 mg Oral TID PRN       Assessment:  [name] Less is a 31 y.o. female 1 month PP with unclear psychiatric history and SUD who voluntary presented to CPEP due to SI/HI in the setting of active substance use and housing instability. Her current overwhelming stressor is the placement of her newborn son into foster care, related to her active substance use during pregnancy and lack of psychiatric care. Since her son's placement, patient left [facility] program and is now experiencing housing instability, staying with various people where the patient does not feel safe. Her urine toxin screen was positive for cocaine and fetanyl upon admission. Patient has poor insight into her situation, believes that her child was taken away for nefarious reasons by CPS caseworker, who she endorses feeling homicidal  thoughts towards. She was obviously responding to internal stimuli during the therapeutic conversation, and her thought content was quite disorganized with a very poor attention span. Patient repeatedly brought up her ability to perform witchcraft and to put voodoo spells on those who harm her. She also shared that she frequently communicates with evil spirits, but is able to decided when she listens to them or not. Regarding the positive tox screen, the patient was insistent that this is due to strangers "shooting her up", and that she has never  voluntarily taken drugs.    Differential at this time remains broad, in light of the patient's unclear psychiatric and medical history, lack of collateral information, and patient's limited ability to engage in conversation at this time. It does appear that patient has had at least 2 prior hospitalizations for psychotic symptoms, so this appears to be an enduring pattern and not an isolated event. Patient reported a history of a diagnosis of schizophrenia, which is certainly plausible given her current presentation. A psychosubstance- induced psychosis cannot be ruled out at this time given the patient's tox screen and quick improvement since presenting to CPEP several days ago. Given that the patient is recently post-partum, a diagnosis of post-partum psychosis cannot be ruled out, although it appears that the patient had enduring delusions throughout her pregnancy, making this diagnosis less likely.    Updated Daily Assessment:    Patient initially showed mild improvement during this hospitalization, but has somewhat decompensated since Risperdal was discontinued last week. Patient has refused other offered antipsychotics. The patient has no acute inpatient safety concerns, but she continues to discuss witchcraft/voodoo and putting spells on CPS workers and now her son's foster parents. Patient additionally has endorsed HI with plan (buying a gun) to nursing. Patient has additionally demonstrated hypersexual behavior and comments towards nursing staff, concerned that lexapro is contributing to escalation of behavior. Team has made decision to schedule a [facility] hearing for medication against patient objection. Patient will benefit from continued admission for stabilization, medication management, and safe discharge planning.      Plan:    Psychiatric- Psychosis, NOS  -Rivers hearing pending  -Continue Zyprexa 5mg  BID PRN   -Decrease lexapro to 10 mg, intention to taper medication    Medical  -Topical kenalog  ointment for hand pruritus  -Bilateral abscesses: MIPS consulted, thanks for recs    - Apply warm compresses three times/day or more if tolerated to try and get  them to self drain.   - Will start Bactrim.   - If the abscesses continue to get bigger, develop surrounding erythema,  she becomes febrile please call us to re-evaluate as we may need to get  surgery involved  to drain them.    Safety  IM Recommendations: Haldol 5 mg, Ativan 2 mg, Benadryl 50 mg IM  Legal Status: 9.39 (expires 4/28)  Precautions: Escape Precautions, Violence Precautions and Suicide Precautions    Reasons for Continued Stay: Suicidal/homicidal    Disposition:  Pending clinical course, SW placement    This is a Psychologist, occupational note. Please see resident/attending note for the final assessment and plan.    Author: Su Grand, Medical Student as of: 04/24/2022  at: 8:02 AM

## 2022-04-25 DIAGNOSIS — L02419 Cutaneous abscess of limb, unspecified: Secondary | ICD-10-CM

## 2022-04-25 LAB — GRAM STAIN

## 2022-04-25 MED ORDER — LIDOCAINE-EPINEPHRINE 1 %-1:100000 IJ SOLN *I*
10.0000 mL | Freq: Once | INTRAMUSCULAR | Status: AC
Start: 2022-04-25 — End: 2022-04-25
  Administered 2022-04-25: 10 mL via SUBCUTANEOUS
  Filled 2022-04-25: qty 10

## 2022-04-25 MED ORDER — IBUPROFEN 400 MG PO TABS *I*
400.0000 mg | ORAL_TABLET | Freq: Once | ORAL | Status: AC | PRN
Start: 2022-04-25 — End: 2022-04-25
  Administered 2022-04-25: 400 mg via ORAL
  Filled 2022-04-25: qty 1

## 2022-04-25 MED ORDER — IBUPROFEN 400 MG PO TABS *I*
400.0000 mg | ORAL_TABLET | Freq: Four times a day (QID) | ORAL | Status: DC | PRN
Start: 2022-04-25 — End: 2022-05-14
  Administered 2022-05-03 – 2022-05-14 (×6): 400 mg via ORAL
  Filled 2022-04-25 (×6): qty 1

## 2022-04-25 MED ORDER — ACETAMINOPHEN 500 MG PO TABS *I*
1000.0000 mg | ORAL_TABLET | Freq: Three times a day (TID) | ORAL | Status: DC | PRN
Start: 2022-04-25 — End: 2022-05-14
  Administered 2022-04-25 – 2022-05-12 (×5): 1000 mg via ORAL
  Filled 2022-04-25 (×7): qty 2

## 2022-04-25 NOTE — Plan of Care (Signed)
Shift Note: 2030 to 0030    Psychiatric Presentation (Mood, affect, milieu behaviors): Pt was ambivalent when asked what her mood was; affect was broad, smiling appropriately during interaction with Clinical research associate; denied SI/HI/VH/AH; appearance appropriate; reclusive to bed during shift; thought process sequential; cooperative with medications; no management concerns    Safety Concerns: no safety concerns    Pain: no pain concerns voiced    Pt is performing ADLs: Independently;     Observed response to scheduled medications: No adverse reaction      Mental Status Exam  Appearance: Appropriately dressed  Relationship to Interviewer: Cooperative  Psychomotor Activity: Normal  Abnormal Movements: None  Muscle Strength and Tone: Normal  Station/Gait : Normal  Speech : Regular rate, Normal tone  Language: Fluent  Mood: Euthymic  Affect: Appropriate  Thought Process: Sequential  Thought Content: No suicidal ideation, No homicidal ideation  Perceptions/Associations : No hallucinations  Sensorium: Alert  Cognition: Recent memory intact  Fund of Knowledge: Other (UTA)  Insight : Poor  Judgement: Poor

## 2022-04-25 NOTE — Discharge Summary (Addendum)
Inpatient Psychiatry  Discharge Summary   Name: Alexis Vang   MRN: [id number]  DOB: [birth year]     Admission Date: 04/12/2022  3:08 AM  Chief Complaint: Homicidal Ideation  Discharge Date: 05/14/2022  Discharge Attending: Dr. Rande Lawman Course:   Alexis Vang is a 31 y.o. female with a past history reportedly of schizophrenia, unspecified mood disorder, PTSD, ADHD, and substance use disorder (cocaine)  who presented to the hospital voluntarily  with suicidal ideation and homicidal ideation in the context of active substance use, housing instabiltiy and medication/treatment nonadherence.    In CPEP, she endorsed that she had homicidal ideation against CPS workers, because that is where "her baby came from".  She also endorsed paranoid thoughts about being infected with diseases, being poked with needles, and witchcraft/spells.  Patient also appeared to be very disorganized, tangential, and appearing to respond to internal stimuli, and was unable to fully participate in the interview.  Patient was then admitted under 9.39 status.  Patient was started on Risperdal 1mg  BID at CPEP, as well Lexapro 10mg  daily, which patient claimed was a prior home medication.      On admission to the unit, patient continued to appear disorganized, and was visibly responding to internal stimuli.  During initial interview, patient claimed that there were individuals who were dressed as CPS workers, who came and took her child away from her.  She stated that she believed that it was because that individual was "barren", and that her child was taken out of spite.  She continued to endorse homicidal ideation, at one point endorsing specific plan to use a firearm, and at other times to contact [employer] to bring these individuals to the stratosphere to let them asphyxiate.  Given the thought disorganization, paranoia, and appearing to respond to internal stimuli, patient's Risperdal was increased to 2mg  BID.  However, patient soon  started to refuse medications, and demanded that we prescribe her Adderall, Vyvanse, and Xanax for her symptoms.  Patient also requested an increase in the Lexapro to target her depressive symptoms.  This was increased to 20mg  daily, though discontinued as the patient appeared to become hypersexual and elevated.  Given the patient's continued refusal to take medications, a [facility] for treatment over objection was filed, subsequently being granted on 05/01/2022. She was continued on risperidone 2 mg BID, with which she had been partially adherent with prior to the court date, however, she reported unclear side effects from this medication (none were actually observed). She was subsequently transitioned to haloperidol 5 mg BID, ultimately agreeing to a haloperidol decanoate LAI. She received her first haloperidol decanoate 100 mg LAI on 05/08/22 and her second 100 mg injection on 05/13/22. She neither exhibited nor endorsed any adverse side effects from this medication. She will be due for her next haloperidol decanoate 200 mg LAI on or around 06/10/22.    While in the hospital, Alexis [place] Republic was generally in fair behavioral control and did not need IM medications, restraints, or seclusions for acute safety concerns.  No acute medical issues were addressed during the admission.    Alexis [place] has been deemed medically and psychiatrically stable for discharge today, 05/14/22. She currently denies acute suicidal ideation and homicidal ideation. Alexis [place] does not appear to be overtly psychotic, manic, depressed, intoxicated or withdrawing from substances at this time. She was agreeable to adhering to the recommended post-hospitalization treatment plan including medication compliance and prompt followup.  There were no labs or results pending at the time of  discharge.    Psychiatric Diagnostic Impression:   Active Problems:    Psychosis, unspecified psychosis type    Discharge Medications:   She was discharged with a 30 day supply of  medication through [facility].   - benztropine (Cogentin) 1 mg daily PRN EPS    [place] first dose of haloperidol decanoate 100 mg LAI on 05/08/22 and a second 100 mg dose on 05/13/22. She will be due for a haloperidol decanoate 200 mg LAI on or around 06/10/22.        Discharge Plan:   Disposition: [facility] ([address], [place] [address])    Follow Up Appointments:  - Intake appointment with Jola Babinski at [facility] on 05/21/22 at 9:00 AM  - Appointment with medication provider at [facility] on 05/21/22 at 10:30 AM    Terianna Peggs Ralene Muskrat, DO   Psychiatry - PGY 2  05/12/22 at 2:38 PM

## 2022-04-25 NOTE — Provider Consult (Addendum)
General Surgery Consultation Note    Patient: Alexis Vang  LOS: 11 days  Consulting Attending: Dr. Kassie Mends        Consult Question: Bilateral abscesses    CC: Bilateral axillary abscesses    HPI: Alexis Vang is a 31 y.o. female with h/o paranoid schizophrenia who is admitted with psychosis. Since Tuesday she has had pain in bilateral axillas. She was seen by medicine who recommended starting bactrim due to concern for possible abscesses bilaterally. The abscesses have continued to grow in size, so surgery was consulted for possible drainage. She is amenable to drainage but refusing pictures due to paranoia. She is able to provide consent.        Past Medical History:   Past Medical History:   Diagnosis Date   . Anxiety    . Asthma    . Depression    . Gestational diabetes    . Preeclampsia    . Scabies    . Substance abuse        Past Surgical History:   Past Surgical History:   Procedure Laterality Date   . CESAREAN SECTION, LOW TRANSVERSE     . Left leg surgery     . TONSILLECTOMY AND ADENOIDECTOMY         Medications:  Current Facility-Administered Medications   Medication   . sulfamethoxazole-trimethoprim (BACTRIM DS,SEPTRA DS) 800-160 mg per tablet 1 tablet   . risperiDONE (RisperDAL M-TABS) disintegrating tablet 2 mg   . OLANZapine (ZyPREXA ZYDIS) disintegrating tablet 5 mg   . triamcinolone (KENALOG) 0.1 % ointment   . acetaminophen (TYLENOL) tablet 650 mg   . aluminum & magnesium hydroxide w/simethicone (MAALOX ADVANCED REGULAR) suspension 30 mL   . magnesium hydroxide (MILK OF MAGNESIA) 400 MG/5ML suspension 30 mL   . multivitamin (CENTRUM SILVER) with minerals + trace elements tablet   . melatonin tablet 3 mg   . hydrOXYzine HCl (ATARAX) tablet 50 mg       Allergies:   Allergies   Allergen Reactions   . Fish-Derived Products Swelling   . Shellfish-Derived Products Swelling   . Food Nausea And Vomiting     Collard greens       Family History:   family history includes Substance abuse in her  mother.    Social History:   Social History     Tobacco Use   . Smoking status: Never   . Smokeless tobacco: Former     Types: Snuff, Chew   Vaping Use   . Vaping status: Not on file   Substance Use Topics   . Alcohol use: Yes     Comment: last drink unknown at this time        Review of Systems: As above, otherwise 12 point review of systems negative    Physical Examination:  Vitals:  Temp:  [36.4 C (97.5 F)] 36.4 C (97.5 F)  Heart Rate:  [95] 95  Resp:  [14-16] 14  BP: (114)/(78) 114/78  Height: 170.2 cm (5\' 7" ) Weight: 65.8 kg (145 lb)  General appearance: alert and cooperative  Head: normocephalic, atraumatic, face symmetric  Lungs: nonlabored respirations  Heart: RRR  Abdomen: soft, nondistended  Extremities: atraumatic, wwp, no c/c/e, small fluctuant area in bilateral axilla   Neuro: alert, oriented, no focal deficits  Vascular: palpable pulses throughout          Lab Results:  Laboratory values:   No results for input(s): WBC, HGB, HCT, PLT, APTT, INR, ESR, CRP in the last  72 hours.    No components found with this basename: PT No results for input(s): NA, K, CL, CO2, UN, CREAT, GLU, CA, MG, PO4 in the last 72 hours. No results for input(s): AST, ALT, ALK, TB, DB, TP, AMY, LIP, ALB, PALB in the last 72 hours.   Imaging:   EKG 12 lead (initial)    Result Date: 04/18/2022  Sinus rhythm Ventricular premature complex       ASSESSMENT & RECOMMENDATIONS  Alexis Vang is a 31 y.o. female with h/o  paranoid schizophrenia who is admitted with psychosis. Since Tuesday she has had pain in bilateral axillas. Exam notable for 1.2-2 cm fluctuant area superficially in her axilla. Now s/p I&D at bedside.   - s/p Bedside I&D   - Consented for the procedure   - Sample sent for microbiology   - Packing in place, our team will remove packing tomorrow   - Rest of the care per primary team     Author: Daria Pastures, MD as of: 04/25/2022  at: 10:01 AM     Trauma/Acute Care Surgery Attending Note: generla surgery consult    I saw  and evaluated the patient. I agree with the resident's/fellow's findings and plan of care as documented.    Problems addressed:  Problem List    Diagnosis   . Psychosis, unspecified psychosis type [F29]     Assessment requiring an independent historian: no  Items personally reviewed:   Labs:   CMP, CBC and Labs reviewed as noted above  Imaging: None    I have discussed Alexis Vang's treatment plan and/or results with provider team.    Bilateral axillary soft tissue infection    - antibiotic management  - bedside incision and drainage with local wound care  - consent, coags  - will follow clinically until incisional packing discontinued     Signed by: Lanette Hampshire, MD as of 04/25/2022 at 9:07 PM

## 2022-04-25 NOTE — Progress Notes (Signed)
Inpatient Psychiatry  Progress Note   Length of Stay: 11 days    Significant Interval Events:Alexis Vang has been in fair behavioral control, and had no acute events overnight. Alexis Vang was non-adherent with her scheduled medications, refusing most of her morning medications, though did take her evening Risperdal with significant encouragement.  She did utilize atarax as a PRN medication for anxiety.    Subjective:   Alexis Vang was seen and examined this morning by the treatment team in her room. Patient less elevated than in last few days, still perseverating on prescribed medications, stating that she has decided that she will no longer be accepting any "drugs" because she has identified other coping mechanisms, including "going to the movies, to the zoo, bowling, and reading a book". Patient stated that she is no longer worried about her son because she heard from her sister in [place] that CPS has approved her to foster.  Patient unable to engage in goal-directed treatment planning due to paranoia and delusions.    Patient continues to complain of underarm pain. Abscesses appear larger than yesterday.   Asked to take photos for the chart, patient declined.      Review of Systems   HENT: Negative for hearing loss.    Respiratory: Negative for cough.    Gastrointestinal: Negative for nausea and vomiting.   Neurological: Negative for headaches.   Psychiatric/Behavioral: Positive for hallucinations and suicidal ideas. The patient is nervous/anxious.       Objective:   Vitals:  BP 114/78 (BP Location: Left arm)   Pulse 95   Temp 36.4 C (97.5 F) (Infrared)   Resp 14   Ht 1.702 m (5\' 7" )   Wt 65.8 kg (145 lb)   SpO2 100%   BMI 22.71 kg/m   Wt Readings from Last 1 Encounters:   04/12/22 65.8 kg (145 lb)      Mental Status Exam:  Appearance: Appears stated age, Well-groomed, Good hygiene and Dressed in Associate Professor  Attitude/Behavior: Hostile, Suspicious and Agitated  Motor Activity: No Abnormal Movements  Eye Contact:  Indirect  Speech: Pressured and Rapid  Mood: "I want to be discharged"  Affect: Labile, Irritable  and Agitated  Thought Process: Tangential and Disorganized   Thought Content: Persecutory Delusions, Paranoid Delusions and Preoccupations  Perception: Did not appear to be responding to internal stimuli during exam  Current Suicidal Ideation: Endorses suicidal ideation  Current Homicidal Ideation: Endorses homicidal ideation  Concentration: Poor  Cognition:  Intact  Judgement: Poor  Insight: Poor    Physical Exam:  General: Appears stated age, Well appearing, NAD  Respiratory: Normal effort  Skin: Bilateral axilla absesses  Neurologic: Alert, Oriented to person, place, and time    Current Medications:  . sulfamethoxazole-trimethoprim  1 tablet Oral 2 times per day   . risperiDONE  2 mg Oral 2 times per day   . triamcinolone   Topical 2 times per day   . multivitamin with minerals-trace elements  1 tablet Oral Daily   . melatonin  3 mg Oral Daily @ 1800           . OLANZapine  5 mg Oral BID PRN   . acetaminophen  650 mg Oral Q4H PRN   . aluminum & magnesium hydroxide w/simethicone  30 mL Oral Q8H PRN   . magnesium hydroxide  30 mL Oral Daily PRN   . hydrOXYzine HCl  50 mg Oral TID PRN       Pertinent Labs:    Labs:  No New Labs     Impression:   Alexis Vang is a 31 y.o. female with a past history reportedly of schizophrenia, unspecified mood disorder, PTSD and ADHDwho presented to the hospital voluntarilywith suicidal ideation and homicidal ideationin the context of active substance use, housing instabiltiy and medication/treatment nonadherence.    Today, Alexis Vang presents as continuing to be irritable, paranoid, with active delusions.  Lexapro was discontinued completely yesterday due to concern for mania.  Patient continuing to have poor insight and judgement about her psychiatric illness.  Took evening dose of Risperdal last night, but declined this morning's dose and states that she will be decline all  future medications. Given this, will pursue the treatment over objection.  Alexis Vang will benefit from continued admission for stabilization, medication management, and safe discharge planning.    Psychiatric Diagnostic Impression:   Active Problems:    Psychosis, unspecified psychosis type  Plan:   Psychosis, NOSvs. PTSD  -Risperdal 2mg  BID  - Zyprexa 5mg  BID PRN for agitation    IM Recommendations: Haldol 5 mg, Ativan 2 mg, Benadryl 50 mg IM  Precautions: Escape Precautions, Violence Precautions and Suicide Precautions  Legal Status: 9.27 (expires 06/06/22)    Reason(s) for Continued Stay: Unable to care for self in a less structured environment  Discharge Plan:  Pending further psychiatric stabilization    Alexis Carns Lesli Albee, MD, PhD   Psychiatry - PGY 2  04/25/22 at 11:35 AM

## 2022-04-25 NOTE — Plan of Care (Signed)
Shift Note: 0830 to 2030    . Psychiatric Presentation (Mood, affect, milieu behaviors): This patient was received at 0830. Patient observed in her room. Took am vitamin and antibiotic. Refused risperidone. She is euthymic with labile affect. Axillary abscesses drained by surgeon. Pain is being managed. MIPS notified of patient requesting higher dose of tylenol and possibly ibuprofen. She would really like a narcotic but due to drug hx this nurse did not request a narcotic.           Mental Status Exam  Appearance: Groomed, Appropriately dressed  Relationship to Interviewer: Cooperative  Psychomotor Activity: Normal  Abnormal Movements: None  Muscle Strength and Tone: Normal  Station/Gait : Normal  Speech : Regular rate, Normal tone, Normal rhythm, Normal amount  Language: Fluent  Mood: Euthymic, Expansive (expansive at times)  Affect: Appropriate, Reactive (reactive at times)  Thought Process: Logical  Thought Content: No suicidal ideation, No homicidal ideation  Perceptions/Associations : No hallucinations  Sensorium: Alert  Cognition: Fair attention span  Progress Energy of Knowledge: Normal  Insight : Poor  Judgement: Poor     Problem: Risk for Suicide  Goal: Patient Will Be Safe And Free From Injury Throughout Hospitalization  Description: This goal applies for the duration of hospitalization  Outcome: Maintaining     Problem: Risk for Violence  Goal: Patient Will Identify 2 Alternative Ways Of Dealing With Stress And Emotional Problems By Day 3  Description: Patient is to IDENTIFY healthy coping skills.  For example, talking with staff or significant others  Outcome: Maintaining

## 2022-04-25 NOTE — Procedures (Incomplete)
Procedure Report    {Post-Proc Note Service-Template:[id number]}

## 2022-04-25 NOTE — Invasive Procedure Plan of Care (Signed)
CONSENT FOR MEDICAL  OR SURGICAL PROCEDURE                            Patient Name: [name] Commins  [facility] [address]                                                              DOB: 01/05/1991        . Please read this form or have someone read it to you.  . It's important to understand all parts of this form. If something isn't clear, ask [place] to explain.  . When you sign it, that means you understand the form and give [place] permission to do this surgery or procedure.    . I agree for Lanette Hampshire, MD along with any assistants* they may choose, to treat the following condition(s): Cyst vs fluid collection of bilateral axilla   . By doing this surgery or procedure on me: Incision and drainage on drainage of cyst/abscess in bilateral axilla.   . This is also known as: Incision and drainage of cyst vs abscess in bilateral axilla   . Laterality: Bilateral     *if you'd like a list of the assistants, please ask. We can give that to you.    1. The care provider has explained my condition to me. They have told me how the procedure can help me. They have told me about other ways of treating my condition. I understand the care provider cannot guarantee the result of the procedure. If I don't have this procedure, my other choices are: Antibiotics    2. The care provider has told me the risks (problems that can happen) of the procedure. I understand there may be unwanted results. The risks that are related to this procedure include: Bleeding, pain, infection, damage to surrounding structures, scarring, incomplete drainage, need for further procedures    3. I understand that during the procedure, my care provider may find a condition that we didn't know about before the treatment started. Therefore, I agree that my care provider can perform any other treatment which they think is necessary and available.    4. I give permission to the hospital and/or its departments to examine and keep tissue, blood, body parts, fluids or  materials removed from my body during the procedure(s) to aid in diagnosis and treatment, after which they may be used for scientific research or teaching by appropriate persons. If these materials are used for science or teaching, my identity will be protected. I will no longer own or have any rights to these materials regardless of how they may be used.    5. My care provider might want a representative from a medical device company to be there during my procedure. I understand that person works for:          The ways they might help my care provider during my procedure include:            6. Here are my decisions about receiving blood, blood products, or tissues. I understand my decisions cover the time before, during and after my procedure, my treatment, and my time in the hospital. After my procedure, if my condition changes a lot, my care provider  will talk with me again about receiving blood or blood products. At that time, my care provider might need me to review and sign another consent form, about getting or refusing blood.    I understand that the blood is from the community blood supply. Volunteers donated the blood, the volunteers were screened for health problems. The blood was examined with very sensitive and accurate tests to look for hepatitis, HIV/AIDS, and other diseases. Before I receive blood, it is tested again to make sure it is the correct type.    My chances of getting a sickness from blood products are small. But no transfusion is 100% safe. I understand that my care provider feels the good I will receive from the blood is greater than the chances of something going wrong. My care provider has answered my questions about blood products.      My decision  about blood or  blood products   Yes        My decision   about tissue  Implants     N/A          I understand this  form.    My care provider  or his/her  assistants have  explained:   What I am having done and why I need it.  What other  choices I can make instead of having this done.  The benefits and possible risks (problems) to me of having this done.  The benefits and possible risks (problems) to me of receiving transplants, blood, or blood products.  There is no guarantee of the results.  The care provider may not stay with me the entire time that I am in the operating or procedure room.  My provider has explained how this may affect my procedure. My provider has answered my  questions about this.         I give my  permission for  this surgery or  procedure.            _______________________________________________                                     My signature  (or parent or other person authorized to sign for you, if you are unable to sign for  yourself or if you are under 50 years old)        ______           Date        _____        Time   Electronic Signatures will display at the bottom of the consent form.    Care provider's statement: I have discussed the planned procedure, including the possibility for transfusion of blood  products or receipt of tissue as necessary; expected benefits; the possible complications and risks; and possible alternatives  and their benefits and risks with the patients or the patient's surrogate. In my opinion, the patient or the patient's surrogate  understands the proposed procedure, its risks, benefits and alternatives.              Electronically signed by: Hassell Done, MD                                                04/25/2022  Date        12:48 PM        Time

## 2022-04-25 NOTE — BH Treatment Plan (Signed)
Inpatient Psychiatry Multi-Disciplinary Treatment Plan             Problem List    Diagnosis   . Psychosis, unspecified psychosis type [F29]             Smart Goal 1  Goal: Safe Discharge  Measureable objective: Take medications as prescribed  Measureable objective: Maintain behavioral control  Measureable objective: Collaborate with treatment team  Treatment: Medications and milieu therapy  Progress toward goal: Progressing toward goal    Multidisciplinary Problems     Multidisciplinary Problems (Active)        Problem: Risk for Suicide    Goal Priority Disciplines Outcome Interventions   Patient Will Be Safe And Free From Injury Throughout Hospitalization    High Nurse Maintaining    Description: This goal applies for the duration of hospitalization          Problem: Risk for Violence    Goal Priority Disciplines Outcome Interventions   Patient Will Identify 2 Alternative Ways Of Dealing With Stress And Emotional Problems By Day 3    Medium Nurse Maintaining    Description: Patient is to IDENTIFY healthy coping skills.  For example, talking with staff or significant others                      Activities Therapy - Interventions  Activities Therapy Interventions: Provide task oriented therapeutic activities to promote mindfulness and distraction skills.;Provide opportunities for socialization to decrease isolation and withdrawal.     Psychologist  Interventions: Individual Therapy                         Patient/Support System/Treatment Team Participating in Treatment Planning:    Patient participation in treatment planning: No - unable to tolerate    Patient Signature:  _________________________________  Date: _______________      Family/Support Network Participating in Investment banker, operational (if applicable):    Name:  ________________________________  Date:  ____________________    Name:  ________________________________  Date:  ____________________    Attending:  Carolyne Littles, * 04/25/2022, 12:38  PM  Treatment Team: Covering Provider: Lorenda Hatchet, MD, PhD 04/25/2022, 12:38 PM    Social Work    Nursing Reviewed/signed byCyndia Diver, RN (04/24/22 1102)  Psychologist Reviewed/signed by: Clois Comber. Adriana Simas, PhD (04/21/22 1610)  Mental Health Therapist    Activity Therapy Reviewed/signed by: Laverda Sorenson, Activities Therapist (04/24/22 1617)

## 2022-04-26 NOTE — Plan of Care (Signed)
Shift Note: 1630 to 0030    Psychiatric Presentation (Mood, affect, milieu behaviors): Patient was received in the milieu, playing games with peers. She was receptive to introduction and assessment. Pt is noted to be intrusive at times and was attempting to take other pts clothing from the laundry room. Pt needing multiple redirections to leave laundry room with only her personal belongings. She has since spent time in the day room and all interactions have remained appropriate. Pt is in NAD at this time and is able to make needs known. 15 minute rounds maintained for safety and security.     Safety Concerns: Patient has denied SI/HI this shift. No SIB, elopement attempts or violent behaviors observed.     Pain: No complaints of pain this shift    Pt is performing ADLs: Independently;     Pt-reported quality of sleep: Restful    Internal stimuli: Patient denies    Observed response to scheduled medications: Patient has been compliant with all scheduled medications with much encouragement. Pt stating "it looks like rat poison but ill take it." Pt provided education however has been minimally receptive         Mental Status Exam  Appearance: Appropriately dressed  Relationship to Interviewer: Intrusive  Psychomotor Activity: Increased  Abnormal Movements: None  Muscle Strength and Tone: Normal  Station/Gait : Normal  Speech : Regular rate, Normal tone  Language: Fluent  Mood: Euthymic  Affect: Appropriate  Thought Process: Disorganized  Thought Content: No suicidal ideation, No homicidal ideation  Perceptions/Associations : No hallucinations  Sensorium: Alert  Cognition: Fair attention span  Progress Energy of Knowledge: Below average  Insight : Poor  Judgement: Poor     Problem: Risk for Suicide  Goal: Patient Will Be Safe And Free From Injury Throughout Hospitalization  Description: This goal applies for the duration of hospitalization  Outcome: Maintaining

## 2022-04-26 NOTE — Progress Notes (Addendum)
General Surgery Progress Note    Patient: Alexis Vang    LOS: 12 days    Attending: Curlene Labrum     INTERVAL HISTORY & SUBJECTIVE  No acute events since bedside I&D yesterday  Pt reports some pain around b/l axilla  Answered questions regarding dressing changes and showering     OBJECTIVE  Physical Exam:  Temp:  [36.4 C (97.5 F)] 36.4 C (97.5 F)  Heart Rate:  [95] 95  Resp:  [14-16] 14  BP: (114)/(78) 114/78   GEN: no apparent distress  HEENT: NCAT, face symmetric  CHEST: nonlabored respirations  ABD: soft, nondistended, nontender  NEURO/MOTOR: alert, appropriate  EXTREMITIES: B/L I&D sites under axilla with iodoform packing. Appropriate drainage appreciated, no purulence or skin changes appreciated  VASCULAR: feet wwp, no edema    Intake/Output:  No intake/output data recorded.     Medications:  Current Facility-Administered Medications   Medication   . acetaminophen (TYLENOL) tablet 1,000 mg   . ibuprofen (ADVIL,MOTRIN) tablet 400 mg   . sulfamethoxazole-trimethoprim (BACTRIM DS,SEPTRA DS) 800-160 mg per tablet 1 tablet   . risperiDONE (RisperDAL M-TABS) disintegrating tablet 2 mg   . OLANZapine (ZyPREXA ZYDIS) disintegrating tablet 5 mg   . triamcinolone (KENALOG) 0.1 % ointment   . aluminum & magnesium hydroxide w/simethicone (MAALOX ADVANCED REGULAR) suspension 30 mL   . magnesium hydroxide (MILK OF MAGNESIA) 400 MG/5ML suspension 30 mL   . multivitamin (CENTRUM SILVER) with minerals + trace elements tablet   . melatonin tablet 3 mg   . hydrOXYzine HCl (ATARAX) tablet 50 mg         Laboratory values:   No results for input(s): WBC, HGB, HCT, PLT, APTT, INR, ESR, CRP in the last 72 hours.    No components found with this basename: PT No results for input(s): NA, K, CL, CO2, UN, CREAT, GLU, CA, MG, PO4 in the last 72 hours. No results for input(s): AST, ALT, ALK, TB, DB in the last 72 hours.No results for input(s): TP, ALB, PALB in the last 72 hours.  No results for input(s): AMY, LIP in the last 72 hours.    GLUCOSE: No results for input(s): PGLU in the last 72 hours.  Imaging: No results found.     ASSESSMENT  Alexis Vang is a 31 y.o. female with h/o paranoid schizophrenia that is s/p I&D of bilateral axillary abscesses    PLAN   Surgery will  Examine axilla once more tomorrow. Ok for primary team to replace ABD dressing PRN    Ok to shower regularly. Pat area dry after shower   Rest of care per primary team      Author: Benjaman Lobe, DO as of: 04/26/2022  at: 12:04 AM     Addendum:    Patient evaluated this AM. Packing got removed this AM while patient was it the showed. Abscess are well drained. Would appears clean. Dry dressing PRN per nursing. abx per primary team. EGS team will signoff. Please page [place] with any question or concerns.

## 2022-04-26 NOTE — Plan of Care (Signed)
Shift Note: 0800 to 1630    . Psychiatric Presentation (Mood, affect, milieu behaviors): Euthymic mood, appropriate affect. Pt refused to take Risperdal, agreed to take her antibiotic and multivitamin.  In reference to the Risperdal, pt stated "it's trash." Pt later approached staff stating "I want all of my medications written down for when I go to court."  Pt talking to a peer that had been in a verbal altercation, appearing to escalate the situation.  Pt had to be asked to move away from peers door.  This Clinical research associate discussed the plan of care for the bilateral axilla wounds. Pt verbalized understanding.  The packing fell out of the left wound in the shower, then stated "the other one was gross so I just started pulling and it kept coming out."  Both wounds covered with ABD dressings.    . Safety Concerns: Denies SI/HI/AVH    . Pain: Denies    . Pt is performing ADLs: Independently;     . Pt-reported quality of sleep: Restful    . Internal stimuli: Patient denies    . Observed response to scheduled medications: No adverse effects noted.     . If uncooperative with scheduled medications:  o # times offered: 2  o Strategies used for encouraging med adherence and effectiveness: medication education      Mental Status Exam  Appearance: Appropriately dressed  Relationship to Interviewer: Cooperative  Psychomotor Activity: Increased  Abnormal Movements: None  Muscle Strength and Tone: Normal  Station/Gait : Normal  Speech : Regular rate, Normal tone, Normal rhythm  Language: Fluent  Mood: Euthymic  Affect: Appropriate  Thought Process: Sequential  Thought Content: No suicidal ideation, No homicidal ideation  Perceptions/Associations : No hallucinations  Sensorium: Alert  Cognition: Recent memory intact  Fund of Knowledge: Below average  Insight : Poor  Judgement: Poor      Problem: Risk for Suicide  Goal: Patient Will Be Safe And Free From Injury Throughout Hospitalization  Description: This goal applies for the duration of  hospitalization  Outcome: Maintaining     Problem: Risk for Violence  Goal: Patient Will Identify 2 Alternative Ways Of Dealing With Stress And Emotional Problems By Day 3  Description: Patient is to IDENTIFY healthy coping skills.  For example, talking with staff or significant others  Outcome: Completed or Resolved

## 2022-04-27 LAB — AEROBIC CULTURE

## 2022-04-27 NOTE — Lactation Note (Signed)
Lactation Consultant Daily Visit   Patient: [name] " "        Age: 31 y.o.              Corrected GA: blank  MRN: [id number]      Maternal Information   Mothers Name:   This patient's mother is not on file.   Visit Type: routine/daily       Interventions and Instructions   Lactation received request from nursing to consult this patient who had requested a breast pump. Per bedside nurse, patient exposed her breast and demonstrated to them  that she is still lactating.    Patient was last seen by E. Manson Passey RN, IBCLC (03/03/2022) Per lactation note,  Maternal breast milk is not safe to provide infant at this time. Parent was provided engorgement management protocol and hand pump at that time.    Patient is now about 8 weeks postpartum and admitted to [id number] . When LC arrived to patient's room, she was getting ready to leave for an inpatient group activity.  LC offered to speak with her first for a few minutes .  Patient took manual breast pump from LC's hand and demonstrated that she knew how to use it.  LC began to explain to patient that at this time, it is not advisable for her baby to receive breast milk.  Patient stated " But I can drink  It, I figure if its good for the baby, its good for me." LC briefly explained that her milk production will naturally decrease and stop if she does not remove it or stimulate her breasts, additional stimulation causes milk production .Patient was very active and excitable, not attentive to LC .  LC began to discuss engorgement management when patient quickly stood up, sprayed peri spray on her nipples and left to go to group.    Bedside RN and charge nurse present during this interaction. Discussed with nursing staff that if having the ability to pump occasionally is beneficial to patient's mental health, it need not be discouraged, but that maternal breaset milk can not be given to baby at this time. Monitor for signs of engorgement and mastitis.     Lactation available daily  7am - 7pm  If there are any questions or concerns.        Biagio Borg, RN  Lactation Consultant

## 2022-04-27 NOTE — Plan of Care (Signed)
Problem: Risk for Suicide  Goal: Patient Will Be Safe And Free From Injury Throughout Hospitalization  Description: This goal applies for the duration of hospitalization  Outcome: Progressing towards goal     0800-1630: Patient presents with elevated mood and euphoric affect. She refused scheduled Risperdal. Patient stated "I'll go to court and fight. I'll fight the judge. I'll only fight men." Accepting of all other scheduled meds. Patient said she is upset that the chaplain left, and  states "He had to perform an exorcism and never came back." Patient complained of pain and engorgement of breasts. She had a baby 8 weeks ago. Patient lifted her shirt and squeezed out milk while present in the milieu. Patient was given ice packs and was seen by the lactation nurse. There is a hand held pump currently in her room. Patient is disorganized with illogical thinking. She believes that she has custody of her baby. When she was told that the milk could not be used at this time, patient said she would drink it. She also sprayed peri spray on her nipples before staff could intervene, while the lactation nurse was educating her about preventing infection.

## 2022-04-27 NOTE — Plan of Care (Signed)
Shift Note: 1630 to 0030    Psychiatric Presentation (Mood, affect, milieu behaviors): Patient was received in the milieu socializing with peers and singing. She is noted to have a euthymic mood and affect. Pt remains disorganized and bizarre at times. She has declined ABD pads to armpits this shift despite educations, stating "they feel good." No signs or symptoms of infection present in armpits. She settled to bed early and is resting with even, unlabored respirations. Pt is in NAD at this time and is able to make needs known. 15 minute rounds maintained for safety and security.     Safety Concerns: denies    Pain: denies    Pt is performing ADLs: Independently;     Pt-reported quality of sleep: Restful    Internal stimuli: Patient denies    Observed response to scheduled medications: compliant with encouragement.       Mental Status Exam  Appearance: Disheveled  Relationship to Interviewer: Cooperative  Psychomotor Activity: Increased  Abnormal Movements: None  Muscle Strength and Tone: Normal  Station/Gait : Normal  Speech : Regular rate, Normal tone, Normal rhythm  Language: Fluent  Mood: Euthymic  Affect: Appropriate  Thought Process: Disorganized  Thought Content: No suicidal ideation, No homicidal ideation  Perceptions/Associations : No hallucinations  Sensorium: Alert  Cognition: Fair attention span  Progress Energy of Knowledge: Normal  Insight : Poor  Judgement: Poor     Problem: Risk for Suicide  Goal: Patient Will Be Safe And Free From Injury Throughout Hospitalization  Description: This goal applies for the duration of hospitalization  Outcome: Maintaining

## 2022-04-28 LAB — AEROBIC CULTURE
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
## 2022-04-28 NOTE — Plan of Care (Signed)
Evening  04/28/22  2030-0030  Pt would not meaningfully participate with nursing assessment - answering only yes or no.  Pt was compliant with hs med pass after encouragement, but appeared confused and somewhat paranoid.  She mumbled incoherently when asked to sit up but thanked this Clinical research associate on exit.  Pt declined assessment of her bilat axillary I&D incision sites.  She denies SI/HI and AVH.  Pt did not request or require any prn medication. Will continue to monitor for behavioral or safety issues.      Problem: Risk for Suicide  Goal: Patient Will Be Safe And Free From Injury Throughout Hospitalization  Description: This goal applies for the duration of hospitalization  Outcome: Progressing towards goal
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
## 2022-04-29 LAB — LIPID PANEL
Chol/HDL Ratio: 1.8
Cholesterol: 156 mg/dL
HDL: 86 mg/dL — ABNORMAL HIGH (ref 40–60)
LDL Calculated: 50 mg/dL
Non HDL Cholesterol: 70 mg/dL
Triglycerides: 102 mg/dL

## 2022-04-29 LAB — ANAEROBIC CULTURE: Anaerobic Culture: 0
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
## 2022-04-29 NOTE — Plan of Care (Signed)
Shift Note: 0830 to 1630    . Psychiatric Presentation (Mood, affect, milieu behaviors): Patient was received in her room eating breakfast with peers. She was receptive to introduction and assessment. Pt noted to be isolative to room a majority of the shift after breakfast. She has a dysphoric mood and affect. Pt is disorganized and illogical at times. She is in NAD at this time and is able to make needs known. 15 minute rounds maintained for safety and security.     . Safety Concerns: Patient has denied SI/HI this shift. No SIB, elopement attempts or violent behaviors observed.     . Pain: denies    . Pt is performing ADLs: Independently;     . Pt-reported quality of sleep: Restful    . Internal stimuli: Patient denies    . Observed response to scheduled medications: Patient has been compliant with all scheduled medications this shift, without issue. No signs or symptoms of adverse reaction or EPS observed.       PRN Use:   Precipitating Behavior/Complaint PRN Med Administration Effect   psychosis zyprexa Requested and Given positive                   Mental Status Exam  Appearance: Appropriately dressed  Relationship to Interviewer: Cooperative  Psychomotor Activity: Normal  Abnormal Movements: None  Muscle Strength and Tone: Normal  Station/Gait : Normal  Speech : Regular rate  Language: Fluent  Mood: Dysphoric  Affect: Restricted  Thought Process: Disorganized, Goal-directed  Thought Content: No suicidal ideation, No homicidal ideation  Perceptions/Associations : No hallucinations  Sensorium: Alert  Cognition: Fair attention span  Progress Energy of Knowledge: Normal  Insight : Poor  Judgement: Poor     Problem: Risk for Suicide  Goal: Patient Will Be Safe And Free From Injury Throughout Hospitalization  Description: This goal applies for the duration of hospitalization  Outcome: Maintaining
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
## 2022-04-30 LAB — HEMOGLOBIN A1C: Hemoglobin A1C: 5 %

## 2022-04-30 NOTE — Plan of Care (Signed)
Shift Note: 2000 to 0030    . Psychiatric Presentation (Mood, affect, milieu behaviors): Pt compliant with ordered bloodwork. Pt requested her credit card information, stating she wanted to send money to her family. Pt was informed that treatment team would need to approve this. Pt mostly seclusive to her room this shift, however she did attend group. Pt became irritable when told she could not have her credit card info tonight, however she was otherwise pleasant and polite throughout shift. Pt denies all physical complaints. Wounds to bilateral armpits clean and dry, no drainage, no bleeding, and no s/s of infection noted.     . Safety Concerns: Pt denies SI/HI. She has not engaged in any violent or self injurious behaviors.    . Pain: Pt denies c/o pain or discomfort.    . Pt is performing ADLs: Independently;     . Internal stimuli: Patient denies    . Observed response to scheduled medications: All scheduled medications tolerated well. No adverse effects noted.    Mental Status Exam  Appearance: Appropriately dressed  Relationship to Interviewer: Cooperative  Psychomotor Activity: Normal  Abnormal Movements: None  Muscle Strength and Tone: Normal  Station/Gait : Normal  Speech : Regular rate, Normal tone, Normal rhythm, Normal amount  Language: Fluent  Mood: Euthymic  Affect: Restricted  Thought Process: Disorganized  Thought Content: No suicidal ideation, No homicidal ideation  Perceptions/Associations : No hallucinations  Sensorium: Alert  Cognition: Fair attention span  Progress Energy of Knowledge: Normal  Insight : Poor  Judgement: Poor     Problem: Risk for Suicide  Goal: Patient Will Be Safe And Free From Injury Throughout Hospitalization  Description: This goal applies for the duration of hospitalization  Outcome: Progressing towards goal

## 2022-04-30 NOTE — Plan of Care (Signed)
Shift Note: 2000 to 0000    . Psychiatric Presentation (Mood, affect, milieu behaviors): Expansive mood, appropriate affect.  Pt having multiple requests for extra supplies. Pt encouraged to check room before more supplies are handed out. Good behavioral control in the milieu. Appropriate interactions with peers.     . Safety Concerns: Denies SI/HI/AVH.    . Pain: Denies    . Pt is performing ADLs: Independently;     . Internal stimuli: Patient denies    . Observed response to scheduled medications: No adverse side effects noted.       Mental Status Exam  Appearance: Groomed, Appropriately dressed  Relationship to Interviewer: Cooperative  Psychomotor Activity: Normal  Abnormal Movements: None  Muscle Strength and Tone: Normal  Station/Gait : Normal  Speech : Regular rate, Normal tone, Normal rhythm, Normal amount  Language: Fluent  Mood: Expansive  Affect: Appropriate  Thought Process: Disorganized  Thought Content: No suicidal ideation, No homicidal ideation  Perceptions/Associations : No hallucinations  Sensorium: Alert  Cognition: Fair attention span  Progress Energy of Knowledge: Normal  Insight : Poor  Judgement: Poor     Problem: Risk for Suicide  Goal: Patient Will Be Safe And Free From Injury Throughout Hospitalization  Description: This goal applies for the duration of hospitalization  Outcome: Maintaining

## 2022-04-30 NOTE — Student Note (Signed)
Medical Student Psychiatry Inpatient Daily Progress Note    LOS: 16 days    Interim Events:   -NAOE but fair behavioral control, has been largely isolative and illogical with some irritability.  -Accepted both scheduled doses of risperdal yesterday, with PRN usage of olanzapine.    Subjective:   This morning, patient informed senior resident that she had cast a spell that "came back to her" and told her to stop taking all medications. Patient then refused morning dose of risperdal.     Later when writer and attending, Dr. Celene Skeen, spoke to patient in her room she informed team that she had cast another love spell on entire team so that "none of [place] would feel left out". Patient repeatedly questioned attending on what she "should say to judge tomorrow so that you don't get in trouble". Team assured patient that she should simply tell the truth to her best understanding. Patient then informed team that she had cast a spell to make herself pregnant once again, and that she was still lactating. Patient then pulled down her top, exposing her breast and walking up to Clinical research associate. Attending attempted to redirect patient and asked her to cover herself, but patient stated to writer "you're a woman you can look" before squeezing her breast expressing breastmilk which land on writer's pants.    Review of systems:  New symptoms:   Physical complaints: Hand pain/puritis, given topical kenalog ointment; bilateral abscesses under axilla, started on bactrim    Vitals:  BP: (113)/(67)   Temp:  [36.3 C (97.3 F)]   Temp src: Infrared (05/02 0853)  Heart Rate:  [98]   Resp:  [16-18]   SpO2:  [99 %]      Mental Status Exam:  Appearance: Appropriately dressed and groomed. Wearing makeup today  Attitude/Behavior: Pleasant, engaged  Motor Activity: None  Muscle Tone: Normal  Eye Contact: Good  Language: Fluent/comprehensible  Speech: Normal rate/tone/volume  Mood: "I'm good"  Affect: Euthymic, pleasant, some areas of inapropriate  laughter  Thought Process: Disorganized, tangential  Thought Content: Denies SI/HI, speaking of spells and roots  Perception: Appeared to be responding to internal stimuli at some points  Orientation: Oriented to person, place, time  Cognition: Intact  Insight: Poor  Judgement: Extremely Poor    Pertinent Labs/EKG/Imaging:   -No new labs    Current Medications:  Scheduled Meds:  . risperiDONE  2 mg Oral 2 times per day   . multivitamin with minerals-trace elements  1 tablet Oral Daily   . melatonin  3 mg Oral Daily @ 1800     PRN Meds:  . acetaminophen  1,000 mg Oral TID PRN   . ibuprofen  400 mg Oral Q6H PRN   . OLANZapine  5 mg Oral BID PRN   . aluminum & magnesium hydroxide w/simethicone  30 mL Oral Q8H PRN   . magnesium hydroxide  30 mL Oral Daily PRN   . hydrOXYzine HCl  50 mg Oral TID PRN       Assessment:  Alexis Vang is a 31 y.o. female 1 month PP with unclear psychiatric history and SUD who voluntary presented to [facility] due to SI/HI in the setting of active substance use and housing instability. Her current overwhelming stressor is the placement of her newborn son into foster care, related to her active substance use during pregnancy and lack of psychiatric care. Since her son's placement, patient left [facility] program and is now experiencing housing instability, staying with various people where  the patient does not feel safe. Her urine toxin screen was positive for cocaine and fetanyl upon admission. Patient has poor insight into her situation, believes that her child was taken away for nefarious reasons by CPS caseworker, who she endorses feeling homicidal thoughts towards. She was obviously responding to internal stimuli during the therapeutic conversation, and her thought content was quite disorganized with a very poor attention span. Patient repeatedly brought up her ability to perform witchcraft and to put voodoo spells on those who harm her. She also shared that she frequently communicates  with evil spirits, but is able to decided when she listens to them or not. Regarding the positive tox screen, the patient was insistent that this is due to strangers "shooting her up", and that she has never voluntarily taken drugs.    Differential at this time remains broad, in light of the patient's unclear psychiatric and medical history, lack of collateral information, and patient's limited ability to engage in conversation at this time. It does appear that patient has had at least 2 prior hospitalizations for psychotic symptoms, so this appears to be an enduring pattern and not an isolated event. Patient reported a history of a diagnosis of schizophrenia, which is certainly plausible given her current presentation. A psychosubstance- induced psychosis cannot be ruled out at this time given the patient's tox screen and quick improvement since presenting to CPEP several days ago. Given that the patient is recently post-partum, a diagnosis of post-partum psychosis cannot be ruled out, although it appears that the patient had enduring delusions throughout her pregnancy, making this diagnosis less likely.    Updated Daily Assessment:    Patient initially showed mild improvement during this hospitalization, but somewhat decompensated when she stopped taking her scheduled Risperdal. Lexapro was discontinued due to concern for escalating reactivity/mania. Patient briefly agreed to resume risperdal, but then refused once again on 5/3. Patient continues to discuss witchcraft/voodoo and putting spells on CPS workers as well as members of the treatment team. Patient has not shown any improvement in her insight into her condition and situation while hospitalized, and continues to demonstrate poor judgement, as seen in interaction today, leading team to schedule [place] for TOO. Patient will benefit from continued admission for stabilization, medication management, and safe discharge  planning.      Plan:    Psychiatric- Psychosis, NOS  -[place] hearing for [place]OO scheduled 5/4  -Continue Risperdal 2mg  BID  -Continue Zyprexa 5mg  BID PRN     Medical  -Topical kenalog ointment for hand pruritus  -Bilateral abscesses: MIPS consulted, thanks for recs    -Day 7/7 of Bactrim   - S/p I&D on 4/28       Safety  IM Recommendations: Haldol 5 mg, Ativan 2 mg, Benadryl 50 mg IM  Legal Status: 9.39 (expires 4/28)  Precautions: Escape Precautions, Violence Precautions and Suicide Precautions    Reasons for Continued Stay: Suicidal/homicidal    Disposition:  Pending clinical course, SW placement    This is a Psychologist, occupational note. Please see resident/attending note for the final assessment and plan.    Author: Su Grand, Medical Student as of: 04/30/2022  at: 8:08 AM

## 2022-04-30 NOTE — Plan of Care (Signed)
Shift Note: 0800 to 1630    . Psychiatric Presentation (Mood, affect, milieu behaviors): [name] presents today with euthymic, irritable mood and restricted irritable affect. Later in the day her mood continued to be irritable but elevated. She adamantly refused her scheduled risperidone, stating "I don't take drugs." Writer asked what the difference is between the Bactrim she had been willingly taking every dose of and the risperidone because they are both "drugs." She stated "well one is antibiotics and they treat something but one is antipsychotics and it doesn't treat anything because I don't have mental illness." [name] was not able to tolerate further discussion of medications at that time. She took space in her room for much of the first half of the day, but was later more activated and elevated. She became upset related to her court hearing tomorrow and requested to speak with MHLS, who also happened to call to speak with her at that time.  . Around 1500 [name] reportedly expressed breast milk onto the medical student who was interviewing her in her room. Her explanation for this was that "she looked at me like she didn't believe I could do it." [name] was insistent that the medical student wanted her to demonstrate her lactation. Writer asked her to, if she feels someone on her care team is requesting she demonstrate her lactation, to verbally confirm this with them before confirming. Writer further stated that in general while here in the hospital, unless she has her child actively latched and breastfeeding, to keep her breasts to herself and covered. [name] found this very funny and laughed quite a bit, but when Clinical research associate asked for verbal demonstration of understanding, she provided only a milquetoast response.  . Around 1530, [name] used a plastic spoon to pick the lock on the main unit entrance door, successfully. She was quickly stopped by staff before she could elope, but refused to hand the spoon  over to staff. The door was guarded until facilities could come to address any improvements that could be made to the door/lock. [name] is demonstrating and endorsing active escape ideation.    . Safety Concerns: Active escape ideation. Irritable with staff but otherwise denies SI/HI.    Marland Kitchen Pain: Denies    . Pt is performing ADLs: Independently;     . Pt-reported quality of sleep: Restful    . Internal stimuli: Patient denies    . Observed response to scheduled medications: Refused Risperdal this morning, stating "I don't have mental illness to treat."      Mental Status Exam  Appearance: Appropriately dressed  Relationship to Interviewer: Cooperative  Psychomotor Activity: Normal  Abnormal Movements: None  Muscle Strength and Tone: Normal  Station/Gait : Normal  Speech : Regular rate, Normal tone, Normal rhythm, Normal amount  Language: Fluent, Normal comprehension  Mood: Euthymic, Irritable  Affect: Restricted, Irritable  Thought Process: Disorganized  Thought Content: No suicidal ideation, No homicidal ideation  Perceptions/Associations : No hallucinations  Sensorium: Alert  Cognition: Fair attention span  Progress Energy of Knowledge: Normal  Insight : Poor  Judgement: Poor      Problem: Risk for Suicide  Goal: Patient Will Be Safe And Free From Injury Throughout Hospitalization  Description: This goal applies for the duration of hospitalization  Outcome: Maintaining

## 2022-04-30 NOTE — Plan of Care (Signed)
Shift Note: 1600 to 0030    . Psychiatric Presentation (Mood, affect, milieu behaviors): Pt continues to present as disorganized and illogical. She was frequently at nurses station this evening, making several requests. Pt engaged in bizarre behavior, such as asking for a large amount of shower supplies to wash out empty food containers. Pt was speaking in a [place] accent throughout shift. Pt was found to have a bottle of nail polish in her room, she also admitted to staff that she had nail polish in her pocket. 3 bottles of nail polish were taken from patient's room. Pt appears to be hoarding items in her room, asking several different staff members for multiple towels, washcloths, body wash, ect.  Pt was reluctant to take HS risperdal, pt initially stated, "I'm not taking that anymore." Pt teaching provided, pt eventually complied with taking HS medications per orders. Pt had c/o "having a broken heart," however she refused to elaborate. Pt denies all physical complaints. She denies SI/HI/AVH.     Marland Kitchen Safety Concerns: Pt denies SI/HI. She has not engaged in any violent or self injurious behaviors.    . Pain: Pt denies c/o pain or discomfort.    . Pt is performing ADLs: Independently;   . Internal stimuli: Patient denies    . Observed response to scheduled medications: All scheduled medications tolerated well, no adverse effects noted.      Mental Status Exam  Appearance: Groomed, Appropriately dressed  Relationship to Interviewer: Cooperative  Psychomotor Activity: Normal  Abnormal Movements: None  Muscle Strength and Tone: Normal  Station/Gait : Normal  Speech : Regular rate, Normal tone, Normal rhythm, Normal amount  Language: Fluent  Mood: Expansive  Affect: Appropriate  Thought Process: Disorganized  Thought Content: No suicidal ideation, No homicidal ideation  Perceptions/Associations : No hallucinations  Sensorium: Alert  Cognition: Fair attention span  Progress Energy of Knowledge: Normal  Insight : Poor  Judgement:  Poor     Problem: Risk for Suicide  Goal: Patient Will Be Safe And Free From Injury Throughout Hospitalization  Description: This goal applies for the duration of hospitalization  Outcome: Progressing towards goal

## 2022-04-30 NOTE — Progress Notes (Addendum)
Inpatient Psychiatry  Progress Note   Length of Stay: 16 days    Significant Interval Events: Alexis Vang has been in fair behavioral control and had no acute events overnight. Per nursing, she was noted to be largely isolative, disorganized, and illogical at times, with intermittent irritability. Alexis Vang was adherent with her scheduled medication, risperidone, and did utilize olanzapine as a PRN medication for agitation/psychosis.     Subjective:   Alexis Vang was seen and examined this morning by the treatment team. States she cast another spell this morning and "when the winds brought it back to me" decided shouldn't take her risperidone anymore. Expresses her belief that she is being drugged and that she doesn't like what the medication does to her, though she doesn't elaborate further. She shares Clinical research associate should see her in 1 week to see the real her after the medication has left her system. States belief that adhering to medication and recommendations of team will be of no benefit in trying to get her daughter back. Otherwise voices no questions or concerns.     ADDENDUM:  On subsequent interaction, Alexis Vang informed team she had cast a love spell on the entire team so they wouldn't feel bad and also pulled out her breast and squirted the medical student with breast milk to prove she was still lactating.    Review of Systems   Constitutional: Negative for diaphoresis.   HENT: Negative for nosebleeds.    Eyes: Negative for redness.   Respiratory: Negative for cough.    Cardiovascular: Negative for leg swelling.   Gastrointestinal: Negative for vomiting.   Genitourinary: Negative for frequency.   Musculoskeletal: Negative for falls.   Skin: Negative for rash.   Neurological: Negative for seizures.   Psychiatric/Behavioral: Negative for depression, hallucinations and suicidal ideas. The patient does not have insomnia.       Objective:   Vitals:  BP 113/67 (BP Location: Right arm)   Pulse 98   Temp 36.3 C (97.3 F)  (Infrared)   Resp 18   Ht 1.702 m (5\' 7" )   Wt 65.8 kg (145 lb)   SpO2 99%   BMI 22.71 kg/m   Wt Readings from Last 1 Encounters:   04/12/22 65.8 kg (145 lb)      Mental Status Exam:  Appearance: Appears stated age and Dressed in Associate Professor  Attitude/Behavior: Uncooperative  Motor Activity: No Abnormal Movements  Eye Contact: Direct  Speech: Normal rate, Normal rhythm, Normal amount and Normal tone  Mood: "I cast a spell and when the winds brought it back to me they told me I should stop my risperidone"  Affect: Irritable   Thought Process: Illogical and Goal Directed  Thought Content: Regular talk of witchcraft  Perception: No overt evidence of hallucinations  Current Suicidal Ideation: Denies  Current Homicidal Ideation: Denies  Concentration: Fair  Cognition:  Largely intact  Judgement: Poor  Insight: Poor    Physical Exam:  General: Appears stated age, NAD  Respiratory: Normal effort, No wheezing  Skin: No rashes or wounds  Neurologic: Alert, Awake    Current Medications:  . risperiDONE  2 mg Oral 2 times per day   . multivitamin with minerals-trace elements  1 tablet Oral Daily   . melatonin  3 mg Oral Daily @ 1800           . acetaminophen  1,000 mg Oral TID PRN   . ibuprofen  400 mg Oral Q6H PRN   . OLANZapine  5 mg Oral BID  PRN   . aluminum & magnesium hydroxide w/simethicone  30 mL Oral Q8H PRN   . magnesium hydroxide  30 mL Oral Daily PRN   . hydrOXYzine HCl  50 mg Oral TID PRN       Pertinent Labs:    Labs:    Endocrine:     Lab results: 04/29/22  1942   Hemoglobin A1C 5.0     EKG:    04/12/2022   QTc 459        Impression:   Alexis Vang a 30 y.o.femalewith a reported past history of schizophrenia, unspecified mood disorder, PTSD and ADHDwho presented to the hospital voluntarilywith suicidal ideation and homicidal ideationin the context of active substance use, housing instabiltiy and medication/treatment non-adherence.    Today, Alexis Vang continues with symptoms of psychosis that had  shown some improvement with adherence to risperidone. However, she declined her risperidone this morning. Continuing to do so would be detrimental to the progress she has made and, as such, she may need [facility] for TOO. Given the above, Alexis Vang will benefit from continued admission for further monitoring, stabilization, medication management, and safe discharge planning.     Psychiatric Diagnostic Impression:   Active Problems:    Psychosis, unspecified psychosis type    Plan:   Psychosis, NOSvs. PTSD  -Risperdal 2mg  BID  - Zyprexa 5mg  BID PRN for agitation    IM Recommendations: Haldol 5 mg, Ativan 2 mg, Benadryl 50 mg IM  Precautions: Escape Precautions, Violence Precautions and Suicide Precautions  Legal Status: 9.27 (expires 06/06/2022)    Reason(s) for Continued Stay: Unable to care for self in a less structured environment  Discharge Plan:  Pending further psychiatric stabilization    Alexis Ruland Ralene Muskrat, DO   Psychiatry - PGY 2  04/30/22 at 7:21 AM

## 2022-05-01 NOTE — Progress Notes (Signed)
Inpatient Psychiatry  Progress Note   Length of Stay: 17 days    Significant Interval Events: Alexis Vang has been in poor behavioral control and had no acute events overnight, per se. Per nursing, she was noted to pick the lock to the unit using a utensil. As documented yesterday, impulsively pulled out breast during interview and squirted medical student with breast milk. Spoke in a [place] accent throughout part of shift. Disorganized, bizarre, making numerous requests. Alexis Vang exhibited sporadic adherence with her scheduled medications, risperidone, and did not utilize her PRN medications.     Subjective:   Alexis Vang was seen and examined this morning by the treatment team. Shares she wanted to escape so she just picked the door, noting it was easy. When asked why she didn't run away, states "I'm not crazy." Expresses belief she does not have a mental illness. Asks what she should say at court. Team repeatedly informs patient to just be herself and be honest. Reports all she wants help with is housing and states she'll tell the judge she just wants to stay if team can help her with housing. Team again reiterates she should be honest and express herself truthfully. Voices no specific questions.    Review of Systems   Constitutional: Negative for diaphoresis.   HENT: Negative for nosebleeds.    Eyes: Negative for redness.   Respiratory: Negative for cough.    Cardiovascular: Negative for leg swelling.   Gastrointestinal: Negative for vomiting.   Genitourinary: Negative for frequency.   Musculoskeletal: Negative for falls.   Skin: Negative for rash.   Neurological: Negative for seizures.   Psychiatric/Behavioral: Negative for depression, hallucinations and suicidal ideas. The patient does not have insomnia.       Objective:   Vitals:  BP 104/71 (BP Location: Right arm)   Pulse 96   Temp 36.2 C (97.2 F) (Infrared)   Resp 16   Ht 1.702 m (5\' 7" )   Wt 65.8 kg (145 lb)   SpO2 99%   BMI 22.71 kg/m   Wt Readings  from Last 1 Encounters:   04/12/22 65.8 kg (145 lb)      Mental Status Exam:  Appearance: Appears stated age and Dressed in Associate Professor  Attitude/Behavior: Generally cooperative  Motor Activity: No Abnormal Movements  Eye Contact: Largely direct  Speech: Largely normal rate, rhythm, amount, and tone  Mood: "I'm not crazy"  Affect: Mildly anxious  Thought Process: Illogical, Goal Directed and Somewhat disorganized  Thought Content: Delusions  Perception: Auditory Hallucinations  Current Suicidal Ideation: Denies  Current Homicidal Ideation: Denies  Concentration: Fair  Cognition:  Intact  Judgement: Poor  Insight: Poor    Physical Exam:  General: Appears stated age, NAD  Respiratory: Normal effort, No wheezing  Skin: No rashes or wounds  Neurologic: Alert, Awake    Current Medications:  . risperiDONE  2 mg Oral 2 times per day   . multivitamin with minerals-trace elements  1 tablet Oral Daily   . melatonin  3 mg Oral Daily @ 1800           . acetaminophen  1,000 mg Oral TID PRN   . ibuprofen  400 mg Oral Q6H PRN   . OLANZapine  5 mg Oral BID PRN   . aluminum & magnesium hydroxide w/simethicone  30 mL Oral Q8H PRN   . magnesium hydroxide  30 mL Oral Daily PRN   . hydrOXYzine HCl  50 mg Oral TID PRN  Pertinent Labs:    Labs:    Endocrine:       Lab results: 04/29/22  1942   Hemoglobin A1C 5.0     EKG:    04/12/2022   QTc 459        Impression:   Alexis Vang a 31 y.o.femalewith a reported past history of schizophrenia, unspecified mood disorder, PTSD and ADHDwho presented to the hospital voluntarilywith suicidal ideation and homicidal ideationin the context of active substance use, housing instabiltiy and medication/treatment non-adherence.    Today, Alexis Vang remains psychotic with notable improvement in these symptoms when adherent to her scheduled risperidone, however, this has been erratic. Notably, when she declined her risperidone yesterday morning, she had a challenging day as described in the  significant interval events section above. Court for treatment over objection scheduled for today. Given the above, Alexis Vang will benefit from continued admission for further monitoring, stabilization, medication management, and safe discharge planning.     Psychiatric Diagnostic Impression:   Active Problems:    Psychosis, unspecified psychosis type    Plan:   Psychosis, NOSvs. PTSD  -Risperdal 2mg  BID  - Zyprexa 5mg  BID PRN for agitation    IM Recommendations: Haldol 5 mg, Ativan 2 mg, Benadryl 50 mg IM  Precautions: Escape Precautions, Violence Precautions and Suicide Precautions  Legal Status: 9.27 (expires 06/06/2022)    Reason(s) for Continued Stay: Unable to care for self in a less structured environment  Discharge Plan:  Pending further psychiatric stabilization    Timm Bonenberger Ralene Muskrat, DO   Psychiatry - PGY 2  05/01/22 at 7:24 AM

## 2022-05-01 NOTE — Plan of Care (Signed)
Shift Note: 0830 to 1630    . Psychiatric Presentation (Mood, affect, milieu behaviors): Pt reports feeling "great" with appropriate affect. Disorganized in conversation, originally declined morning medications, told writer she wanted to leave the hospital and did not need medications, then asked writer, "wait, would it be good to take meds if I want to get discharged?". Writer explained to pt that, typically, taking medications as prescribed assists in discharge.  After writer's explanation, pt declined medication. Writer asked pt to clarify if she would like to remain on the unit or be discharged, to which pt was unable to offer an affirmative answer one way or the other. Smiles inappropriately sometimes in conversation. Not wearing a bra in milieu for majority of morning. Attended court hearing at 1430, pt lost, did not appear to understand results or the courts granting treatment over objection (will need education reinforced once court order is present on unit and medications ordered are to be administered).     . Safety Concerns: Denies SI/HI    . Pain: Denies pain    . Pt is performing ADLs: Independently;   o If not independent, comment: Hair care, Shower and Changed clothes    . Pt-reported quality of sleep: Restful    . Internal stimuli: Patient denies    . Observed response to scheduled medications: Tolerating well with no side effects reported or observed.      Mental Status Exam  Appearance: Groomed  Relationship to Interviewer: Cooperative, Friendly  Psychomotor Activity: Normal  Abnormal Movements: None  Muscle Strength and Tone: Normal  Station/Gait : Normal  Speech : Regular rate, Normal tone  Language: Fluent  Mood: Elevated  Affect: Appropriate  Thought Process: Disorganized  Thought Content: No suicidal ideation, No homicidal ideation  Perceptions/Associations : No hallucinations  Sensorium: Alert  Cognition: Poor attention span  Fund of Knowledge: Normal  Insight : Poor  Judgement:  Poor      Problem: Risk for Suicide  Goal: Patient Will Be Safe And Free From Injury Throughout Hospitalization  Description: This goal applies for the duration of hospitalization  Outcome: Progressing towards goal

## 2022-05-01 NOTE — Plan of Care (Signed)
Mental Status Exam  Appearance: Groomed  Relationship to Interviewer: Cooperative, Friendly  Psychomotor Activity: Normal  Abnormal Movements: None  Muscle Strength and Tone: Normal  Station/Gait : Normal  Speech : Regular rate, Normal tone, Normal rhythm  Language: Fluent  Mood: Elevated  Affect: Appropriate  Thought Process: Disorganized  Thought Content: No suicidal ideation, No homicidal ideation  Perceptions/Associations : No hallucinations  Sensorium: Alert  Cognition: Poor attention span  Fund of Knowledge: Normal  Insight : Poor  Judgement: Poor    [id number]  Patient came to nurses station at start of shift to ask who her nurse would be tonight, Clinical research associate introduced herself. She immediately stated she needed her cream, then state she needed her dressing done. Writer noted there was not a cream ordered at this time. She showed writer her incisions from an abscess removal on 04/25/22, they appeared to be healed. Writer stated this and she said "I know I was just trying to get something to do, I'm going to be spoiled if I'm going to be here and so are you". She then walked away. She came back within a few minutes and asked Clinical research associate for coffee, writer proceeded to get for her after which she sat in the alcove to drink and watch TV (proceeded to get up several times and talk at the TV). She continued to present as somewhat elevated throughout the shift. Interacted well with peers. Denies SI/HI/AVH, no other safety concerns voiced or gestured.

## 2022-05-01 NOTE — Plan of Care (Signed)
Shift Note: 2030-0030    Psychiatric Presentation (Mood, affect, milieu behaviors): Pt was calm and pleasant on contact; affect despondent; denied SI/HI or perceptual disturbances; when Pt was asked why she appeared quite downcast, Pt replied "I think my doctor does not like me"; Pt was receptive to counseling done by Clinical research associate; when Pt was offered her night medication (Risperidone), Pt expressed "I am going to take that now but only because I want to leave soon"; thought process sequential; speech normal in rate, tone and speed; good eye contact; no irritability, impulsivity or agitation noted; no inappropriate behavior observed during interaction; no management concerns    Safety Concerns:No safety concerns    Pain: No pain concerns voiced    Pt is performing ADLs: Independently;     Observed response to scheduled medications: No adverse reactions noted or observed    Mental Status Exam  Appearance: Groomed  Relationship to Interviewer: Cooperative, Friendly  Psychomotor Activity: Normal  Abnormal Movements: None  Muscle Strength and Tone: Normal  Station/Gait : Normal  Speech : Regular rate, Normal tone  Language: Fluent  Mood: Dysphoric  Affect: Appropriate  Thought Process: Sequential  Thought Content: No suicidal ideation, No homicidal ideation  Perceptions/Associations : No hallucinations  Sensorium: Alert  Cognition: Poor attention span  Fund of Knowledge: Normal  Insight : Poor  Judgement: Poor     Problem: Risk for Suicide  Goal: Patient Will Be Safe And Free From Injury Throughout Hospitalization  Description: This goal applies for the duration of hospitalization  Outcome: Maintaining

## 2022-05-02 MED ORDER — EUCERIN EX CREA *I*
TOPICAL_CREAM | CUTANEOUS | Status: DC | PRN
Start: 2022-05-02 — End: 2022-05-14
  Filled 2022-05-02: qty 57
  Filled 2022-05-02: qty 106
  Filled 2022-05-02: qty 113
  Filled 2022-05-02: qty 106

## 2022-05-02 MED ORDER — HALOPERIDOL LACTATE 5 MG/ML IJ SOLN *I*
5.0000 mg | Freq: Every day | INTRAMUSCULAR | Status: DC
Start: 2022-05-03 — End: 2022-05-05

## 2022-05-02 MED ORDER — HALOPERIDOL LACTATE 5 MG/ML IJ SOLN *I*
5.0000 mg | Freq: Every evening | INTRAMUSCULAR | Status: DC
Start: 2022-05-02 — End: 2022-05-05

## 2022-05-02 MED ORDER — RISPERIDONE 2 MG PO TBDP *I*
2.0000 mg | ORAL_TABLET | Freq: Every evening | ORAL | Status: DC
Start: 2022-05-02 — End: 2022-05-05
  Administered 2022-05-02 – 2022-05-04 (×3): 2 mg via ORAL
  Filled 2022-05-02 (×3): qty 1

## 2022-05-02 MED ORDER — RISPERIDONE 2 MG PO TBDP *I*
2.0000 mg | ORAL_TABLET | Freq: Every day | ORAL | Status: DC
Start: 2022-05-03 — End: 2022-05-05
  Administered 2022-05-03 – 2022-05-04 (×2): 2 mg via ORAL
  Filled 2022-05-02 (×2): qty 1

## 2022-05-02 NOTE — BH Treatment Plan (Signed)
Inpatient Psychiatry Multi-Disciplinary Treatment Plan     Start Date: 05/02/22  Update Due: 05/09/22  Treatment Plan Type: Update  Desired goals from patient and/or family: Resolution of symptoms. Safe discharge.    Problem List    Diagnosis   . Psychosis, unspecified psychosis type [F29]             Smart Goal 1  Goal: To establish an individualized treatment plan  Measureable objective: Maintain safety on the unit  Measureable objective: Participate in unit programming, treatment team meetings, and family visits/ meetings  Measureable objective: Maintain medication adherence  Measureable objective: Participate in safe discharge planning  Treatment: Medications and milieu therapy  Progress toward goal: Progressing toward goal  Goal status: Revised    Multidisciplinary Problems     Multidisciplinary Problems (Active)        Problem: Risk for Suicide    Goal Priority Disciplines Outcome Interventions   Patient Will Be Safe And Free From Injury Throughout Hospitalization    High Nurse Maintaining    Description: This goal applies for the duration of hospitalization                                                      Patient/Support System/Treatment Team Participating in Treatment Planning:    Patient participation in treatment planning: No, Reason: Unavailable    Patient Signature:  _________________________________  Date: _______________      Family/Support Network Participating in Treatment Planning (if applicable):    Name:  ________________________________  Date:  ____________________    Name:  ________________________________  Date:  ____________________    Attending:  Carolyne Littles, * 05/02/2022, 2:26 PM  Treatment Team: Covering Provider: Lyndal Pulley, DO 05/02/2022, 2:26 PM    Social Work    Nursing Reviewed/signed byJulian Hy, RN (04/28/22 1044)  Psychologist    Mental Health Therapist    Activity Therapy

## 2022-05-02 NOTE — Plan of Care (Signed)
1630-0030:    [name] has been visible on the unit, wearing personal clothing with fair attention to hygiene and grooming. She is able to make her needs known. She has been in ok control, sometimes she will run down the hall, she needs to be reminded appropriate use of the Ipad at times. She denies SI/HI/AH and VH. She complained of abdominal pain and she was given Tylenol. She had no further complaints. No acute concerns.    Problem: Risk for Suicide  Goal: Patient Will Be Safe And Free From Injury Throughout Hospitalization  Description: This goal applies for the duration of hospitalization  Outcome: Progressing towards goal

## 2022-05-02 NOTE — Progress Notes (Signed)
Inpatient Psychiatry  Progress Note   Length of Stay: 18 days    Significant Interval Events: Alexis Vang has been in fair behavioral control and had no acute events overnight, per se. Per nursing, she was noted to be disorganized and at times smiling inappropriately. Team granted treatment over objection on 05/01/2022. Alexis Vang has been adherent with her scheduled medication, risperidone, and did not utilize her PRN medications.     Subjective:   Alexis Vang was seen and examined this morning by the treatment team. She reports feeling ok. Notes she has lots of largely mundane thoughts going through her head. Asks attending about days of the week. Notes she sees her son M/W/F. Expresses her belief that her sister will get legal custody of her son. Shares her desire for housing. Begins talking about spirits, grasping attending's hand and gently blowing on it to illustrate her perception of spirits. Repeatedly states something along the lines of when people think she's experiencing AVH, she's just interacting with spirits. Reiterates her belief that she doesn't have schizophrenia.    Review of Systems   Constitutional: Negative for diaphoresis.   HENT: Negative for nosebleeds.    Eyes: Negative for redness.   Respiratory: Negative for cough.    Cardiovascular: Negative for leg swelling.   Gastrointestinal: Negative for vomiting.   Genitourinary: Negative for frequency.   Musculoskeletal: Negative for falls.   Skin: Negative for rash.   Neurological: Negative for seizures.   Psychiatric/Behavioral: Negative for depression, hallucinations and suicidal ideas. The patient does not have insomnia.       Objective:   Vitals:  BP 103/64 (BP Location: Left arm)   Pulse 68   Temp 36.6 C (97.9 F) (Infrared)   Resp 16   Ht 1.702 m (5\' 7" )   Wt 65.8 kg (145 lb)   SpO2 100%   BMI 22.71 kg/m   Wt Readings from Last 1 Encounters:   04/12/22 65.8 kg (145 lb)      Mental Status Exam:  Appearance: Appears stated age and Dressed in  Associate Professor  Attitude/Behavior: Generally cooperative  Motor Activity: No Abnormal Movements  Eye Contact: Direct  Speech: Somewhat pressured, difficult to interrupt  Mood: "Ok"  Affect: Mildly anxious  Thought Process: Disorganized   Thought Content: Delusions  Perception: Possible auditory hallucinations  Current Suicidal Ideation: Denies  Current Homicidal Ideation: Denies  Concentration: Fair  Cognition:  Intact  Judgement: Poor  Insight: Poor    Physical Exam:  General: Appears stated age, NAD  Respiratory: Normal effort, No wheezing  Skin: No rashes or wounds  Neurologic: Alert, Awake    Current Medications:  . risperiDONE  2 mg Oral 2 times per day   . multivitamin with minerals-trace elements  1 tablet Oral Daily   . melatonin  3 mg Oral Daily @ 1800           . acetaminophen  1,000 mg Oral TID PRN   . ibuprofen  400 mg Oral Q6H PRN   . OLANZapine  5 mg Oral BID PRN   . aluminum & magnesium hydroxide w/simethicone  30 mL Oral Q8H PRN   . magnesium hydroxide  30 mL Oral Daily PRN   . hydrOXYzine HCl  50 mg Oral TID PRN       Pertinent Labs:    Labs:    Endocrine:       Lab results: 04/29/22  1942   Hemoglobin A1C 5.0     EKG:    04/12/2022  QTc 459        Impression:   Alexis Vang a 31 y.o.femalewith a reported past history of schizophrenia, unspecified mood disorder, PTSD and ADHDwho presented to the hospital voluntarilywith suicidal ideation and homicidal ideationin the context of active substance use, housing instabiltiy and medication/treatment non-adherence.    Today, Alexis Vang remains with symptoms of psychosis. [facility] for treatment over objection granted on 05/01/22. She has been adherent to her risperidone since granting of [facility]. Does appear somewhat more pressured today, concerning for possible mania. May consider initiation of mood stabilizer. Given the above, Alexis Vang will benefit from continued admission for further monitoring, stabilization, medication management, and safe discharge  planning.     Psychiatric Diagnostic Impression:   Active Problems:    Psychosis, unspecified psychosis type    Plan:   Psychosis, NOSvs. PTSD  -Risperdal 2mg  BID with court-ordered haloperidol 5 mg IM backup (treatment order: [id number]; granted 05/01/2022)  - Zyprexa 5mg  BID PRN for agitation    IM Recommendations: Haldol 5 mg, Ativan 2 mg, Benadryl 50 mg IM  Precautions: Escape Precautions, Violence Precautions and Suicide Precautions  Legal Status: 9.27 (expires 06/06/2022)    Reason(s) for Continued Stay: Unable to care for self in a less structured environment  Discharge Plan:  Pending further psychiatric stabilization    Opie Maclaughlin Ralene Muskrat, DO   Psychiatry - PGY 2  05/02/22 at 8:36 AM

## 2022-05-02 NOTE — Student Note (Signed)
Medical Student Psychiatry Inpatient Daily Progress Note    LOS: 18 days    Interim Events:   -NAOE, good behavioral control but according to nursing patient was disorganized and at times smiling inappropriately   -Team granted ability to treat patient over objection on 05/01/22  -Accepted both scheduled doses of risperdal yesterday, with no PRN usage.    Subjective:   This morning, patient was seen by treatment team without Clinical research associate. Patient stated that she had a lot of mundane thoughts going through her mind, lots of thoughts about her son.     Writer attempted to see patient later in afternoon but she was sleeping and asked to not be disturbed.      Review of systems:  New symptoms:   Physical complaints: Hand pain/puritis, given topical kenalog ointment  Vitals:  BP: (103)/(64)   Temp:  [36.6 C (97.9 F)]   Temp src: Infrared (05/04 0845)  Heart Rate:  [68]   Resp:  [16]   SpO2:  [100 %]      Mental Status Exam:  Appearance: Wearing personal pajamas  Attitude/Behavior: Generally cooperative  Motor Activity: None  Muscle Tone: Normal  Eye Contact: Good  Language: Fluent/comprehensible  Speech: Normal rate/tone/volume  Mood: "Okay"  Affect: Euthymic, pleasant, some areas of inapropriate laughter  Thought Process: Disorganized, tangential  Thought Content: Denies SI/HI, speaking of spells and roots  Perception: Appeared to be responding to internal stimuli at some points  Orientation: Oriented to person, place, time  Cognition: Intact  Insight: Poor  Judgement: Extremely Poor    Pertinent Labs/EKG/Imaging:   -No new labs    Current Medications:  Scheduled Meds:  . risperiDONE  2 mg Oral 2 times per day   . multivitamin with minerals-trace elements  1 tablet Oral Daily   . melatonin  3 mg Oral Daily @ 1800     PRN Meds:  . acetaminophen  1,000 mg Oral TID PRN   . ibuprofen  400 mg Oral Q6H PRN   . OLANZapine  5 mg Oral BID PRN   . aluminum & magnesium hydroxide w/simethicone  30 mL Oral Q8H PRN   . magnesium hydroxide   30 mL Oral Daily PRN   . hydrOXYzine HCl  50 mg Oral TID PRN       Assessment:  Alexis Vang is a 31 y.o. female 1 month PP with unclear psychiatric history and SUD who voluntary presented to CPEP due to SI/HI in the setting of active substance use and housing instability. Her current overwhelming stressor is the placement of her newborn son into foster care, related to her active substance use during pregnancy and lack of psychiatric care. Since her son's placement, patient left [facility] program and is now experiencing housing instability, staying with various people where the patient does not feel safe. Her urine toxin screen was positive for cocaine and fetanyl upon admission. Patient has poor insight into her situation, believes that her child was taken away for nefarious reasons by CPS caseworker, who she endorses feeling homicidal thoughts towards. She was obviously responding to internal stimuli during the therapeutic conversation, and her thought content was quite disorganized with a very poor attention span. Patient repeatedly brought up her ability to perform witchcraft and to put voodoo spells on those who harm her. She also shared that she frequently communicates with evil spirits, but is able to decided when she listens to them or not. Regarding the positive tox screen, the patient was insistent that  this is due to strangers "shooting her up", and that she has never voluntarily taken drugs.    Differential at this time remains broad, in light of the patient's unclear psychiatric and medical history, lack of collateral information, and patient's limited ability to engage in conversation at this time. It does appear that patient has had at least 2 prior hospitalizations for psychotic symptoms, so this appears to be an enduring pattern and not an isolated event. Patient reported a history of a diagnosis of schizophrenia, which is certainly plausible given her current presentation. A  psychosubstance- induced psychosis cannot be ruled out at this time given the patient's tox screen and quick improvement since presenting to CPEP several days ago. Given that the patient is recently post-partum, a diagnosis of post-partum psychosis cannot be ruled out, although it appears that the patient had enduring delusions throughout her pregnancy, making this diagnosis less likely.    Updated Daily Assessment:    Patient initially showed mild improvement during this hospitalization, but somewhat decompensated when she stopped taking her scheduled Risperdal. Lexapro was discontinued due to concern for escalating reactivity/mania. Patient briefly agreed to resume risperdal, but then refused once again on 5/3. Patient continues to discuss witchcraft/voodoo and putting spells on CPS workers as well as members of the treatment team. Patient has not shown any improvement in her insight into her condition and situation while hospitalized, and continues to demonstrate poor judgement. [place] was held on 5/5, resulting in judge allowing team to treat patient over objections. Patient will benefit from continued admission for stabilization, medication management, and safe discharge planning.      Plan:    Psychiatric- Psychosis, NOS  -Continue Risperdal 2mg  BID  -Continue Zyprexa 5mg  BID PRN     Medical  -Topical kenalog ointment for hand pruritus        Safety  IM Recommendations: Haldol 5 mg, Ativan 2 mg, Benadryl 50 mg IM  Legal Status: 9.39 (expires 4/28)  Precautions: Escape Precautions, Violence Precautions and Suicide Precautions    Reasons for Continued Stay: Suicidal/homicidal    Disposition:  Pending clinical course, SW placement    This is a Psychologist, occupational note. Please see resident/attending note for the final assessment and plan.    Author: Su Grand, Medical Student as of: 05/02/2022  at: 8:11 AM

## 2022-05-02 NOTE — Plan of Care (Signed)
Shift Note: 0830 to 1630    . Psychiatric Presentation (Mood, affect, milieu behaviors): Presents as euthymic with appropriate affect. Present in the milieu interacting well with others. Initially pt refused morning medication, stated "I don't need any of those." Pt reports "Risperdal makes me hear voices, it's not good for me". About an hour later, pt reported to desk and stated "I would like to take my medication now", pt accepted meds without issue. Pt completed laundry and ADLs. Both underarms appear to be healing well, pt denies pain or discomfort. Pt resting in room in afternoon.     . Safety Concerns: Denies    . Pain: Denies    . Pt is performing ADLs: Independently;     . Pt-reported quality of sleep: Restful    . Internal stimuli: Patient denies    . Observed response to scheduled medications: Moderate     Mental Status Exam  Appearance: Groomed, Appropriately dressed  Relationship to Interviewer: Cooperative, Friendly  Psychomotor Activity: Normal  Abnormal Movements: None  Muscle Strength and Tone: Normal  Station/Gait : Normal  Speech : Regular rate, Normal tone, Normal rhythm, Normal amount  Language: Fluent, Normal repetition, Normal comprehension  Mood: Euthymic  Affect: Appropriate  Thought Process: Goal-directed  Thought Content: No suicidal ideation, No homicidal ideation  Perceptions/Associations : No hallucinations  Sensorium: Alert  Cognition: Poor attention span  Fund of Knowledge: Normal  Insight : Poor  Judgement: Poor

## 2022-05-03 NOTE — Plan of Care (Signed)
1630-0030:    [name] has been visible on the unit, wearing personal clothing with fair attention to hygiene and grooming. She is able to make her needs known and has been labile with staff. She denies SI/HI/AH and VH. She continues to change her clothes throughout the day. She is demanding at times. Irritable at snack time, asking for multiple ice creams (initially asking for 5). She continues to drink a lot of dairy. She came to staff c/o abdominal pain, stating "It's my uterus and I am bleeding internally." Staff assessed her belly and it was unremarkable. She has been given Maalox and Motrin this shift and it appears there is no relief. MIPS paged and came to assess. Vitals are WNL. Recommends labs and trial of no dairy at some point to rule out, otherwise everything appears to be in order. No acute concerns.       Problem: Risk for Suicide  Goal: Patient Will Be Safe And Free From Injury Throughout Hospitalization  Description: This goal applies for the duration of hospitalization  Outcome: Progressing towards goal

## 2022-05-03 NOTE — Plan of Care (Signed)
Problem: Risk for Suicide  Goal: Patient Will Be Safe And Free From Injury Throughout Hospitalization  Description: This goal applies for the duration of hospitalization  Outcome: Progressing towards goal     0800-1630: Patient presents with euthymic mood and affect. She accepted all scheduled medication. Patient talked on the phone with her boyfriend, and was overheard saying it was good that they are both off of drugs. She asked for shower supplies, and needed some limitation with the amount of soap she was requesting. Patient told staff she was bleeding internally, and was concerned that her period blood did not look normal. Writer went in with second RN to assess peri pad. There was a scant amount of blood and was observed to be normal. C section scar is healing and dry, as well as her armpit surgical site which is open to air. No safety concerns were voiced or gestured.

## 2022-05-03 NOTE — Progress Notes (Signed)
IMIPS APP Progress Note:   Patient seen & chart reviewed    Called to see pt due to pt concerns of internally bleeding     Subjective: Alexis Vang states she had a c section in march and started w/ abdominal pain a few days ago. She thinks she is internally bleeding. She points to a space in her abdomen than protrudes when she sits up. She advises she is having regular bowel movements. No fever, chill, chest pain , sob, N/V. Pt relays she could Alexis Vang on her period currently.     Nursing reports pt has been eating a lot of dairy products lately.       Vitals:    05/02/22 0931 05/03/22 0851 05/03/22 1735 05/03/22 2130   BP: 116/71 109/71  111/72   BP Location: Right arm Left arm  Left arm   Pulse: 87 79  92   Resp: 17 16 16 18    Temp: 36.6 C (97.9 F) 36 C (96.8 F)  36.4 C (97.5 F)   TempSrc: Infrared Infrared  Infrared   SpO2: 99% 100%  100%   Weight:       Height:           Objective:   Physical Exam  Vitals reviewed. Exam conducted with a chaperone present [place] ).   Constitutional:       Appearance: Normal appearance.   HENT:      Head: Normocephalic and atraumatic.      Right Ear: External ear normal.      Left Ear: External ear normal.      Nose: Nose normal.      Mouth/Throat:      Mouth: Mucous membranes are moist.   Eyes:      General: No scleral icterus.  Cardiovascular:      Rate and Rhythm: Normal rate.   Pulmonary:      Effort: Pulmonary effort is normal. No respiratory distress.   Abdominal:      General: There is no distension.      Palpations: Abdomen is soft.      Tenderness: There is no abdominal tenderness. There is no guarding or rebound.      Comments: Possible hernia , reducible    Musculoskeletal:         General: Normal range of motion.      Cervical back: Normal range of motion.   Skin:     General: Skin is warm and dry.   Neurological:      Mental Status: She is alert.             Labs: reviewed    No results for input(s): WBC, HGB, HCT, PLT in the last 168 hours.      Lab results:  04/11/22  2341 03/04/22  0619 03/02/22  2144   Sodium 138 140 142   Potassium 4.0 4.0 3.8   Chloride 103 107 111*   CO2 19* 21 23   UN 19 15 13    Creatinine 0.84 0.61 0.58   Glucose 77 72 84   Calcium 8.9 8.3* 8.2*       No results for input(s): INR in the last 168 hours.  No results for input(s): AST, ALT, HTBIL, TB, DB in the last 72 hours.    No components found with this basename: ALKPHOS    No results for input(s): PGLU in the last 168 hours.    Assessment : Briefly, Alexis Vang is a 31 y.o. female with  PMH psychosis, depression, PTSD, and substance use disorder presenting w/ abdominal pain x 2 days. Pt is belly exam appears unremarkable. Possible incisional hernia related to c section not incarcerated. Will check lab work but if unremarkable pt can follow up w/ PCP and OBGYN.    Plan:  - CBC/BMP  - If labs stable can follow up outpt  - If symptoms worsen or fail to improve can notify MIPS for re-evaluation          Mosetta Pigeon, PA  10:11 PM  05/03/2022    Scheduled Meds:  . risperiDONE  2 mg Oral Daily    Or   . haloperidol lactate  5 mg Intramuscular Daily   . risperiDONE  2 mg Oral Nightly    Or   . haloperidol lactate  5 mg Intramuscular Nightly   . multivitamin with minerals-trace elements  1 tablet Oral Daily   . melatonin  3 mg Oral Daily @ 1800

## 2022-05-04 LAB — COMPREHENSIVE METABOLIC PANEL
ALT: 13 U/L (ref 0–35)
AST: 19 U/L (ref 0–35)
Albumin: 3.8 g/dL (ref 3.5–5.2)
Alk Phos: 68 U/L (ref 35–105)
Anion Gap: 13 (ref 7–16)
Bilirubin,Total: 0.3 mg/dL (ref 0.0–1.2)
CO2: 22 mmol/L (ref 20–28)
Calcium: 8.9 mg/dL (ref 8.8–10.2)
Chloride: 103 mmol/L (ref 96–108)
Creatinine: 0.69 mg/dL (ref 0.51–0.95)
Glucose: 99 mg/dL (ref 60–99)
Lab: 21 mg/dL — ABNORMAL HIGH (ref 6–20)
Potassium: 4.2 mmol/L (ref 3.3–5.1)
Sodium: 138 mmol/L (ref 133–145)
Total Protein: 6.4 g/dL (ref 6.3–7.7)
eGFR BY CREAT: 119 *

## 2022-05-04 LAB — CBC AND DIFFERENTIAL
Baso # K/uL: 0 10*3/uL (ref 0.0–0.1)
Basophil %: 0.6 %
Eos # K/uL: 0.3 10*3/uL (ref 0.0–0.4)
Eosinophil %: 5.4 %
Hematocrit: 35 % (ref 34–45)
Hemoglobin: 11 g/dL — ABNORMAL LOW (ref 11.2–15.7)
IMM Granulocytes #: 0 10*3/uL (ref 0.0–0.0)
IMM Granulocytes: 0.4 %
Lymph # K/uL: 2 10*3/uL (ref 1.2–3.7)
Lymphocyte %: 40.1 %
MCH: 30 pg (ref 26–32)
MCHC: 32 g/dL (ref 32–36)
MCV: 94 fL (ref 79–95)
Mono # K/uL: 0.6 10*3/uL (ref 0.2–0.9)
Monocyte %: 11.6 %
Neut # K/uL: 2.1 10*3/uL (ref 1.6–6.1)
Nucl RBC # K/uL: 0 10*3/uL (ref 0.0–0.0)
Nucl RBC %: 0 /100 WBC (ref 0.0–0.2)
Platelets: 269 10*3/uL (ref 160–370)
RBC: 3.7 MIL/uL — ABNORMAL LOW (ref 3.9–5.2)
RDW: 13.6 % (ref 11.7–14.4)
Seg Neut %: 41.9 %
WBC: 5 10*3/uL (ref 4.0–10.0)

## 2022-05-04 NOTE — Plan of Care (Signed)
1630-0030:    [name] has been visible on the unit, wearing different clothes throughout the day. She denies SI/HI/AH and VH. She was compliant with labs this AM and lab work is unremarkable. She has been in good behavioral control. She was yelling from the bathroom this evening and when staff went to assess she appeared to concerned that she is internally bleeding. She was able to be consoled and no further concerns.     Problem: Risk for Suicide  Goal: Patient Will Be Safe And Free From Injury Throughout Hospitalization  Description: This goal applies for the duration of hospitalization  Outcome: Progressing towards goal

## 2022-05-04 NOTE — Progress Notes (Addendum)
IMIPS Follow up note     Follow up lab work w/o acute process. Pt w/ minor drop in H&H compared to admission consistent w/ pt being on menses. Vital signs also stable and abdominal exam is reassuring. Pt still w/ lower abdominal pain but given on menses suspect cramping related to this. Discussed w/ nursing and pt has been declining.   - Would recommend supportive care , NSAIDs as able and tylenol   - If in-fact menses related pain should improve in next few days, if still with abdominal pain after menses, worsening abdominal pain or concerning vitals signs please notify MIPS for re-evaluation.    - Follow up w/ outpt PCP and OBGYN    Patient Vitals for the past 72 hrs:   BP Temp Temp src Pulse Resp SpO2   05/03/22 2130 111/72 36.4 C (97.5 F) Infrared 92 18 100 %   05/03/22 1735 -- -- -- -- 16 --   05/03/22 0851 109/71 36 C (96.8 F) Infrared 79 16 100 %   05/02/22 0931 116/71 36.6 C (97.9 F) Infrared 87 17 99 %   05/02/22 0300 -- -- -- -- 16 --

## 2022-05-04 NOTE — Plan of Care (Addendum)
Shift Note: 0830 to 1630    . Psychiatric Presentation (Mood, affect, milieu behaviors): Pt reports feeling "okay" with dysphoric affect.  Sullen on initial contact, when asked what was wrong or if something was bothering the pt, the pt could not immediately identify a concern.  When asked about pain and physical complaints, pt identified pain in stomach--declined PRN, accepted hot packs. Easily accepted scheduled medications PO. Allowed labs to be drawn and sent--asked good questions about draw process while Clinical research associate completing, expressed interest in pursuing phlebotomy.  Allowed wound assessment in bilateral axilla--both wounds appear fully healed. Later in afternoon, pt told staff she feels medications are making her irritable and sleepy, then states the doctors are giving the pt the medications to "test them out" before offering said medications to other patients.    . Safety Concerns: Denies SI/HI/AVH    . Pain: Endorses 8/10 lower abdominal pain, declined PRN, accepted hot packs with minimal effect.  Continued to decline offer of further intervention, instead requested hot packs throughout day. Reports having BM this morning and yesterday. Reports having her period for the first time since giving birth, unsure if pain is due to period cramps as the pain is worse than pts typical period cramps.    . Pt is performing ADLs: Independently;     . Pt-reported quality of sleep: Restful    . Internal stimuli: Patient denies    . Observed response to scheduled medications: Tolerating well with no side effects reported or observed    Mental Status Exam  Appearance: Appropriately dressed  Relationship to Interviewer: Sullen  Psychomotor Activity: Normal  Abnormal Movements: None  Muscle Strength and Tone: Normal  Station/Gait : Normal  Speech : Regular rate, Normal tone  Language: Fluent, Normal comprehension  Mood: Dysphoric  Affect: Dysphoric  Thought Process: Sequential  Thought Content: No suicidal ideation, No  homicidal ideation  Perceptions/Associations : No hallucinations  Sensorium: Alert  Cognition: Fair attention span  [employer] of Knowledge: Normal  Insight : Poor  Judgement: Poor      Problem: Risk for Suicide  Goal: Patient Will Be Safe And Free From Injury Throughout Hospitalization  Description: This goal applies for the duration of hospitalization  Outcome: Maintaining

## 2022-05-05 MED ORDER — HALOPERIDOL LACTATE 5 MG/ML IJ SOLN *I*
5.0000 mg | Freq: Every day | INTRAMUSCULAR | Status: DC
Start: 2022-05-05 — End: 2022-05-05

## 2022-05-05 MED ORDER — HALOPERIDOL LACTATE 5 MG/ML IJ SOLN *I*
5.0000 mg | Freq: Every evening | INTRAMUSCULAR | Status: DC
Start: 2022-05-05 — End: 2022-05-05

## 2022-05-05 MED ORDER — BENZTROPINE MESYLATE 0.5 MG PO TABS *I*
0.5000 mg | ORAL_TABLET | Freq: Two times a day (BID) | ORAL | Status: DC | PRN
Start: 2022-05-05 — End: 2022-05-08
  Administered 2022-05-08: 0.5 mg via ORAL

## 2022-05-05 MED ORDER — HALOPERIDOL LACTATE 5 MG/ML IJ SOLN *I*
2.5000 mg | Freq: Every day | INTRAMUSCULAR | Status: DC
Start: 2022-05-06 — End: 2022-05-10

## 2022-05-05 MED ORDER — HALOPERIDOL 5 MG PO TABS *I*
5.0000 mg | ORAL_TABLET | Freq: Every evening | ORAL | Status: DC
Start: 2022-05-05 — End: 2022-05-05
  Administered 2022-05-05: 5 mg via ORAL
  Filled 2022-05-05: qty 1

## 2022-05-05 MED ORDER — HALOPERIDOL LACTATE 5 MG/ML IJ SOLN *I*
2.5000 mg | Freq: Every evening | INTRAMUSCULAR | Status: DC
Start: 2022-05-06 — End: 2022-05-13

## 2022-05-05 MED ORDER — HALOPERIDOL 5 MG PO TABS *I*
5.0000 mg | ORAL_TABLET | Freq: Every day | ORAL | Status: DC
Start: 2022-05-06 — End: 2022-05-08
  Administered 2022-05-06 – 2022-05-08 (×3): 5 mg via ORAL
  Filled 2022-05-05 (×3): qty 1

## 2022-05-05 MED ORDER — HALOPERIDOL 5 MG PO TABS *I*
5.0000 mg | ORAL_TABLET | Freq: Every day | ORAL | Status: DC
Start: 2022-05-05 — End: 2022-05-05
  Administered 2022-05-05: 5 mg via ORAL
  Filled 2022-05-05: qty 1

## 2022-05-05 MED ORDER — HALOPERIDOL 5 MG PO TABS *I*
5.0000 mg | ORAL_TABLET | Freq: Every evening | ORAL | Status: DC
Start: 2022-05-06 — End: 2022-05-13
  Administered 2022-05-06 – 2022-05-12 (×7): 5 mg via ORAL
  Filled 2022-05-05 (×7): qty 1

## 2022-05-05 NOTE — Progress Notes (Addendum)
IMIPS Plan of Care Note     Called regarding abx management after I&D.     Pt w/ I&D 04/25/22 for Bilateral axillary fluid collection per General Surgery. Pt had received 7 days of bactrim from 04/22/22 to 04/29/22.     Pt had been asking primary team regarding if she still needs abx for this. Discussed w/ Primary team and advised site appear healing well w/o signs of infection. Pt currently afebrile w/ WBC 5/0 on 05/04/22.     Recommendations:   - Pt as completed 7 days of abx from 04/22/22 , currently afebrile w/ normal WBC 5.0. Site at this time per Primary appears healing well w/o signs of infection. Would recommend against further abx at this time.   - Should site appear w/ any concerns of infection or pt febrile would page MIPS for further evaluation.

## 2022-05-05 NOTE — Plan of Care (Signed)
Shift Note: 0800 to 1630    . Psychiatric Presentation (Mood, affect, milieu behaviors): Presenting with elevated mood and labile affect.  Speech is regular.  Denies SI/HI/AVH/VI/EI.  Milieu activity is elevated, observed running down the hallway to speak to someone else. Pt. Able to make needs known, adherent with unit expectations. Pt. Requesting hygiene supplies. Pt. States, "I only took the meds to better understand the Dr.'s PHD." Pt. States that she feels her doctor is weird. Pt. Accepted court ordered meds after education. Pt. States she would like to receive an LAI.    Marland Kitchen Safety Concerns: no safety concerns.    . Pain: denies pain    . Pt is performing ADLs: Independently and Without prompting;     . Pt-reported quality of sleep: Restful    . Internal stimuli: Patient denies    . Observed response to scheduled medications: tolerating well. No adverse side effects noted.    Mental Status Exam  Appearance: Appropriately dressed, Groomed  Relationship to Interviewer: Cooperative, Eye contact good, Engages well  Psychomotor Activity: Normal  Abnormal Movements: None  Muscle Strength and Tone: Normal  Station/Gait : Normal  Speech : Regular rate, Normal tone, Normal rhythm, Normal amount  Language: Fluent, Normal comprehension  Mood: Elevated  Affect: Labile  Thought Process: Goal-directed, Illogical, Tangential  Thought Content: Paranoia, No suicidal ideation, No homicidal ideation  Perceptions/Associations : No hallucinations  Sensorium: Alert  Cognition: Good attention span  Fund of Knowledge: Normal  Insight : Poor  Judgement: Poor      Problem: Risk for Suicide  Goal: Patient Will Be Safe And Free From Injury Throughout Hospitalization  Description: This goal applies for the duration of hospitalization  Outcome: Maintaining

## 2022-05-05 NOTE — Student Note (Signed)
Medical Student Psychiatry Inpatient Daily Progress Note    LOS: 21 days    Interim Events:   -NAOE, fair behavioral control has been labile with staff  -Accepted all scheduled medications, prn usage of zyprexa x1  -On Saturday called out to staff that she was "bleeding internally", [facility] paged who determined that she was menstruating with possible incisional hernia related to CS, no acute intervention    Subjective:   This morning, patient was seen by treatment team. Patient expressed frustration over still being admitted. Patient was difficult to follow, seems to believe that her baby is with the baby's father rather than with CPS. Patient has new concern that risperdal is causing weight gain and states that she will no longer accept any medications. Treatment team attempted to offer alternative medications but patient was unable to participate in treatment planning due to lability. Patient stated "give me a week without meds and you'll see what I'm like".     Review of systems:  New symptoms:   Physical complaints: Hand pain/puritis, given topical kenalog ointment; dry skin, given eucerin cream  Vitals:        Mental Status Exam:  Appearance: Wearing clothes with appropriate personal hygiene  Attitude/Behavior: Frustrated  Motor Activity: None  Muscle Tone: Normal  Eye Contact: Good  Language: Fluent/comprehensible  Speech: Pressured, normal volume  Mood: "I want to leave"  Affect: Frustrated   Thought Process: Disorganized, circumferential, interuptable  Thought Content: Denies SI/HI, no witchcraft talk  Perception: Did not appear to be responding to internal stimuli  Orientation: Oriented to person, place, time  Cognition: Intact  Insight: Poor  Judgement: Poor    Pertinent Labs/EKG/Imaging:   -No new labs    Current Medications:  Scheduled Meds:  . risperiDONE  2 mg Oral Daily    Or   . haloperidol lactate  5 mg Intramuscular Daily   . risperiDONE  2 mg Oral Nightly    Or   . haloperidol lactate  5 mg  Intramuscular Nightly   . multivitamin with minerals-trace elements  1 tablet Oral Daily   . melatonin  3 mg Oral Daily @ 1800     PRN Meds:  . eucerin   Topical PRN   . acetaminophen  1,000 mg Oral TID PRN   . ibuprofen  400 mg Oral Q6H PRN   . OLANZapine  5 mg Oral BID PRN   . aluminum & magnesium hydroxide w/simethicone  30 mL Oral Q8H PRN   . magnesium hydroxide  30 mL Oral Daily PRN   . hydrOXYzine HCl  50 mg Oral TID PRN       Assessment:  Alexis Vang is a 31 y.o. female 1 month PP with unclear psychiatric history and SUD who voluntary presented to CPEP due to SI/HI in the setting of active substance use and housing instability. Her current overwhelming stressor is the placement of her newborn son into foster care, related to her active substance use during pregnancy and lack of psychiatric care. Since her son's placement, patient left [facility] program and is now experiencing housing instability, staying with various people where the patient does not feel safe. Her urine toxin screen was positive for cocaine and fetanyl upon admission. Patient has poor insight into her situation, believes that her child was taken away for nefarious reasons by CPS caseworker, who she endorses feeling homicidal thoughts towards. She was obviously responding to internal stimuli during the therapeutic conversation, and her thought content was quite disorganized  with a very poor attention span. Patient repeatedly brought up her ability to perform witchcraft and to put voodoo spells on those who harm her. She also shared that she frequently communicates with evil spirits, but is able to decided when she listens to them or not. Regarding the positive tox screen, the patient was insistent that this is due to strangers "shooting her up", and that she has never voluntarily taken drugs.    Differential at this time remains broad, in light of the patient's unclear psychiatric and medical history, lack of collateral information,  and patient's limited ability to engage in conversation at this time. It does appear that patient has had at least 2 prior hospitalizations for psychotic symptoms, so this appears to be an enduring pattern and not an isolated event. Patient reported a history of a diagnosis of schizophrenia, which is certainly plausible given her current presentation. A psychosubstance- induced psychosis cannot be ruled out at this time given the patient's tox screen and quick improvement since presenting to CPEP several days ago. Given that the patient is recently post-partum, a diagnosis of post-partum psychosis cannot be ruled out, although it appears that the patient had enduring delusions throughout her pregnancy, making this diagnosis less likely.    Updated Daily Assessment:    Patient initially showed mild improvement during this hospitalization, but somewhat decompensated when she stopped taking her scheduled Risperdal. Lexapro was discontinued due to concern for escalating reactivity/mania. Patient briefly agreed to resume risperdal, but then refused once again on 5/3. Patient continues to discuss witchcraft/voodoo and putting spells on CPS workers as well as members of the treatment team. Patient has not shown any improvement in her insight into her condition and situation while hospitalized, and continues to demonstrate poor judgement. [place] for TOO was held on 5/5, resulting in judge allowing team to treat patient over objections. Patient will benefit from continued admission for stabilization, medication management, and safe discharge planning.      Plan:  Psychosis, NOSvs. PTSD  -haloperidol 5 mg BID with court-ordered haloperidol 5 mg IM backup (treatment order: [id number]; granted 05/01/2022)  - benztropine 0.5 mg BID PRN EPS  - olanzapine 5mg  BID PRN for agitation    IM Recommendations: Haldol 5 mg, Ativan 2 mg, Benadryl 50 mg IM  Precautions: Escape Precautions, Violence Precautions and Suicide  Precautions  Legal Status: 9.27 (expires 06/06/2022)    Reason(s) for Continued Stay: Unable to care for self in a less structured environment, Stabilization of gains and Need for safe discharge  Discharge Plan:  Pending further psychiatric stabilization    This is a medical student note. Please see resident/attending note for the final assessment and plan.    Author: Su Grand, Medical Student as of: 05/05/2022  at: 8:42 AM

## 2022-05-05 NOTE — Plan of Care (Signed)
Shift Note: 1600 to 0030    Psychiatric Presentation (Mood, affect, milieu behaviors): [name] presents this evening with elevated, dysphoric mood and labile affect. She spent time in the dining room playing a card game with peers, engaged appropriately but appeared to be disorganized in her game choices.    Safety Concerns: None, denies SI/HI    Pain: Denies    Pt is performing ADLs: Independently;     Pt-reported quality of sleep: Restful    Internal stimuli: Patient denies    Observed response to scheduled medications: Appears to be tolerating well, no concerns reported or observed.      Mental Status Exam  Appearance: Appropriately dressed, Groomed  Relationship to Interviewer: Cooperative, Eye contact good, Engages well  Psychomotor Activity: Normal  Abnormal Movements: None  Muscle Strength and Tone: Normal  Station/Gait : Normal  Speech : Regular rate, Normal tone, Normal rhythm, Normal amount  Language: Fluent, Normal comprehension  Mood: Elevated, Expansive  Affect: Labile  Thought Process: Goal-directed, Illogical, Tangential  Thought Content: Paranoia, No suicidal ideation, No homicidal ideation  Perceptions/Associations : No hallucinations  Sensorium: Alert  Cognition: Fair attention span  [employer] of Knowledge: Normal  Insight : Poor  Judgement: Poor      Problem: Risk for Suicide  Goal: Patient Will Be Safe And Free From Injury Throughout Hospitalization  Description: This goal applies for the duration of hospitalization  Outcome: Maintaining

## 2022-05-05 NOTE — Progress Notes (Signed)
Inpatient Psychiatry  Progress Note   Length of Stay: 21 days    Significant Interval Events: Alexis Vang has been in fair behavioral control and had no acute events overnight, per se. Per nursing, she was noted to be intermittently irritable and demanding at times. Expressed belief she was bleeding internaly from her uterus. Evaluated by [facility] over weekend for reported abdominal pain; PE and labwork unremarkable, suspect cramping related to menses. Alexis Vang has been adherent with her scheduled medication, risperidone, and did utilized Maalox, eucerin cream, ibuprofen, and olanzapine as PRN medications for dyspepsia, dry skin, pain, and agitation/psychosis.     Subjective:   Alexis Vang was seen and examined this morning by the treatment team. She talks about her elastin, her dermis, and epidermis. Suggests her child is currently with her partner and when asked for clarification, becomes antagonistic, stating team is asking about her son and not about her. Accuses attending of not spending enough time to get to know her, cutting attending off whenever attending tries to ask her questions. Makes vague threat to attending. States she declined her risperidone this morning. Reports she feels it has been making her gain weight. Does not allow team to explore other options with her.    Review of Systems   Constitutional: Negative for diaphoresis.   HENT: Negative for nosebleeds.    Eyes: Negative for redness.   Respiratory: Negative for cough.    Cardiovascular: Negative for leg swelling.   Gastrointestinal: Negative for vomiting.   Genitourinary: Negative for frequency.   Musculoskeletal: Negative for falls.   Skin: Negative for rash.   Neurological: Negative for seizures.   Psychiatric/Behavioral: Negative for depression, hallucinations and suicidal ideas. The patient does not have insomnia.       Objective:   Vitals:  BP 111/72 (BP Location: Left arm)   Pulse 92   Temp 36.4 C (97.5 F) (Infrared)   Resp 18   Ht 1.702 m (5'  7")   Wt 65.8 kg (145 lb)   SpO2 100%   BMI 22.71 kg/m   Wt Readings from Last 1 Encounters:   04/12/22 65.8 kg (145 lb)      Mental Status Exam:  Appearance: Appears stated age and Dressed in Associate Professor  Attitude/Behavior: Uncooperative  Motor Activity: No Abnormal Movements  Eye Contact: Direct  Speech: Normal rate, Normal rhythm, Normal amount and Normal tone  Mood: "I refused my risperidone this morning"  Affect: Labile and Irritable   Thought Process: Perseverative and Disorganized   Thought Content: No overtly unusual themes  Perception: Not clearly responding to internal stimuli or experiencing AH  Current Suicidal Ideation: Denies  Current Homicidal Ideation: Denies  Concentration: Poor  Cognition:  Intact  Judgement: Poor  Insight: Poor    Physical Exam:  General: Appears stated age, NAD  Respiratory: Normal effort, No wheezing  Skin: No rashes or wounds  Neurologic: Alert, Awake    Current Medications:  . risperiDONE  2 mg Oral Daily    Or   . haloperidol lactate  5 mg Intramuscular Daily   . risperiDONE  2 mg Oral Nightly    Or   . haloperidol lactate  5 mg Intramuscular Nightly   . multivitamin with minerals-trace elements  1 tablet Oral Daily   . melatonin  3 mg Oral Daily @ 1800           . eucerin   Topical PRN   . acetaminophen  1,000 mg Oral TID PRN   . ibuprofen  400 mg Oral Q6H PRN   . OLANZapine  5 mg Oral BID PRN   . aluminum & magnesium hydroxide w/simethicone  30 mL Oral Q8H PRN   . magnesium hydroxide  30 mL Oral Daily PRN   . hydrOXYzine HCl  50 mg Oral TID PRN       Pertinent Labs:    Labs:    Endocrine:       Lab results: 04/29/22  1942   Hemoglobin A1C 5.0     EKG:    04/12/2022   QTc 459        Impression:   Alexis Vang a 31 y.o.femalewith a reported past history of schizophrenia, unspecified mood disorder, PTSD and ADHDwho presented to the hospital voluntarilywith suicidal ideation and homicidal ideationin the context of active substance use, housing instabiltiy and  medication/treatment non-adherence.    Today, Alexis Vang is irritable, aggressive, and disorganized this morning, however, with less overt symptoms of psychosis with adherence to court-ordered antipsychotic medications. She declined her risperidone this morning; medication was changed to haloperidol in an attempt to address her concerns. Given the above, Alexis Vang will benefit from continued admission for further monitoring, stabilization, medication management, and safe discharge planning.     Psychiatric Diagnostic Impression:   Active Problems:    Psychosis, unspecified psychosis type    Plan:   Psychosis, NOSvs. PTSD  -haloperidol 5 mg BID with court-ordered haloperidol 5 mg IM backup (treatment order: [id number]; granted 05/01/2022)  - benztropine 0.5 mg BID PRN EPS  - olanzapine 5mg  BID PRN for agitation    IM Recommendations: Haldol 5 mg, Ativan 2 mg, Benadryl 50 mg IM  Precautions: Escape Precautions, Violence Precautions and Suicide Precautions  Legal Status: 9.27 (expires 06/06/2022)    Reason(s) for Continued Stay: Unable to care for self in a less structured environment, Stabilization of gains and Need for safe discharge  Discharge Plan:  Pending further psychiatric stabilization    Maksym Pfiffner Ralene Muskrat, DO   Psychiatry - PGY 2  05/05/22 at 7:18 AM

## 2022-05-06 LAB — ANAEROBIC CULTURE

## 2022-05-06 NOTE — Progress Notes (Signed)
Inpatient Psychiatry  Progress Note   Length of Stay: 22 days    Significant Interval Events: Alexis Vang has been in fair behavioral control and had no acute events overnight. Per nursing, she was noted to be expansive, elevated, irritable, and disorganized. Alexis Vang has been adherent with her scheduled medication, haloperidol, and did not utilize her PRN medications.     Subjective:   Alexis Vang was seen and examined this morning by the treatment team. She expresses her belief that she is having an allergic reaction to haloperidol, noting if you measured one of her eyes it is now smaller than the other. She also shares the medication is causing her to have a sore throat. She states she won't take any medication unless she knows all of their side effects. She then asks what medication will get her out of here the fastest and says she'll take that. She reiterates that she communicates with spirits, miming blowing on her hand. She reports she's being kidnapped and drugged.    Review of Systems   Constitutional: Negative for diaphoresis.   HENT: Negative for nosebleeds.    Eyes: Negative for redness.   Respiratory: Negative for cough.    Cardiovascular: Negative for leg swelling.   Gastrointestinal: Negative for vomiting.   Genitourinary: Negative for frequency.   Musculoskeletal: Negative for falls.   Skin: Negative for rash.   Neurological: Negative for seizures.   Psychiatric/Behavioral: Negative for depression, hallucinations and suicidal ideas. The patient does not have insomnia.       Objective:   Vitals:  BP 114/73 (BP Location: Right arm)   Pulse (!) 132 Comment: nurse notified  Temp 36.3 C (97.3 F) (Infrared)   Resp 16   Ht 1.702 m (5\' 7" )   Wt 65.8 kg (145 lb)   SpO2 98%   BMI 22.71 kg/m   Wt Readings from Last 1 Encounters:   04/12/22 65.8 kg (145 lb)      Mental Status Exam:  Appearance: Appears stated age and Disheveled  Attitude/Behavior: Irritable  Motor Activity: No Abnormal Movements  Eye Contact:  Direct  Speech: Normal rate, Normal rhythm, Normal amount and Normal tone  Mood: "The risperidone is making one of my eyes bigger than the other"  Affect: Labile  Thought Process: Somewhat disorganized  Thought Content: Continues to endorse talking with spirits  Perception: Not clearly responding to internal stimuli  Current Suicidal Ideation: Denies  Current Homicidal Ideation: Denies  Concentration: Poor-to-Fair  Cognition:  Intact  Judgement: Poor  Insight: Poor    Physical Exam:  General: Appears stated age, NAD  Respiratory: Normal effort, No wheezing  Skin: No rashes or wounds  Neurologic: Alert, Awake    Current Medications:  . haloperidol  5 mg Oral Daily    Or   . haloperidol lactate  2.5 mg Intramuscular Daily   . haloperidol  5 mg Oral Nightly    Or   . haloperidol lactate  2.5 mg Intramuscular Nightly   . multivitamin with minerals-trace elements  1 tablet Oral Daily   . melatonin  3 mg Oral Daily @ 1800           . benztropine  0.5 mg Oral BID PRN   . eucerin   Topical PRN   . acetaminophen  1,000 mg Oral TID PRN   . ibuprofen  400 mg Oral Q6H PRN   . OLANZapine  5 mg Oral BID PRN   . aluminum & magnesium hydroxide w/simethicone  30 mL Oral Q8H  PRN   . magnesium hydroxide  30 mL Oral Daily PRN   . hydrOXYzine HCl  50 mg Oral TID PRN       Pertinent Labs:    Labs:    Endocrine:       Lab results: 04/29/22  1942   Hemoglobin A1C 5.0     EKG:    04/12/2022   QTc 459        Impression:   Alexis Vang [place] a 31 y.o.femalewith a reported past history of schizophrenia, unspecified mood disorder, PTSD and ADHDwho presented to the hospital voluntarilywith suicidal ideation and homicidal ideationin the context of active substance use, housing instabiltiy and medication/treatment non-adherence.    Today, Alexis Vang remains irritable, aggressive, and disorganized but with less overt symptoms of psychosis with adherence to court-ordered medications. She does not display any clear adverse side effects thatn can be  attributed to haloperidol. Given the above, Alexis Vang will benefit from continued admission for further monitoring, stabilization, medication management, and safe discharge planning.     Psychiatric Diagnostic Impression:   Active Problems:    Psychosis, unspecified psychosis type    Plan:   Psychosis, NOSvs. PTSD  -haloperidol 5 mg BID with court-ordered haloperidol 2.5 mg IM backup (treatment order: [id number]; granted 05/01/2022)  - benztropine 0.5 mg BID PRN EPS  - olanzapine 5mg  BID PRN for agitation    IM Recommendations: Haldol 5 mg, Ativan 2 mg, Benadryl 50 mg IM  Precautions: Escape Precautions, Violence Precautions and Suicide Precautions  Legal Status: 9.27 (expires 06/06/2022)    Reason(s) for Continued Stay: Unable to care for self in a less structured environment, Stabilization of gains and Need for safe discharge  Discharge Plan:  Pending further psychiatric stabilization    Epiphany Seltzer Ralene Muskrat, DO   Psychiatry - PGY 2  05/06/22 at 7:29 AM

## 2022-05-06 NOTE — Student Note (Signed)
Medical Student Psychiatry Inpatient Daily Progress Note    LOS: 22 days    Interim Events:   -NAOE, fair behavioral control has been labile with staff  -Accepted all scheduled medications, no PRN usage    Subjective:   This morning, patient seen and examined by the treatment team. She expressed belief that she is having an allergic reaction to haloperidol, stating that if you measure one of her eyes is is now smaller than the other, also states that haldol is causing a sore throat.      Review of systems:  New symptoms:   Physical complaints: Hand pain/puritis, given topical kenalog ointment; dry skin, given eucerin cream  Vitals:        Mental Status Exam:  Appearance: Wearing clothes with appropriate personal hygiene  Attitude/Behavior: Frustrated  Motor Activity: None  Muscle Tone: Normal  Eye Contact: Good  Language: Fluent/comprehensible  Speech: Pressured, normal volume  Mood: "I want to leave"  Affect: Frustrated   Thought Process: Disorganized, circumferential, interuptable  Thought Content: Denies SI/HI, no witchcraft talk  Perception: Did not appear to be responding to internal stimuli  Orientation: Oriented to person, place, time  Cognition: Intact  Insight: Poor  Judgement: Poor    Pertinent Labs/EKG/Imaging:   -No new labs    Current Medications:  Scheduled Meds:  . haloperidol  5 mg Oral Daily    Or   . haloperidol lactate  2.5 mg Intramuscular Daily   . haloperidol  5 mg Oral Nightly    Or   . haloperidol lactate  2.5 mg Intramuscular Nightly   . multivitamin with minerals-trace elements  1 tablet Oral Daily   . melatonin  3 mg Oral Daily @ 1800     PRN Meds:  . benztropine  0.5 mg Oral BID PRN   . eucerin   Topical PRN   . acetaminophen  1,000 mg Oral TID PRN   . ibuprofen  400 mg Oral Q6H PRN   . OLANZapine  5 mg Oral BID PRN   . aluminum & magnesium hydroxide w/simethicone  30 mL Oral Q8H PRN   . magnesium hydroxide  30 mL Oral Daily PRN   . hydrOXYzine HCl  50 mg Oral TID PRN        Assessment:  Alexis Vang is a 31 y.o. female 1 month PP with unclear psychiatric history and SUD who voluntary presented to CPEP due to SI/HI in the setting of active substance use and housing instability. Her current overwhelming stressor is the placement of her newborn son into foster care, related to her active substance use during pregnancy and lack of psychiatric care. Since her son's placement, patient left [facility] program and is now experiencing housing instability, staying with various people where the patient does not feel safe. Her urine toxin screen was positive for cocaine and fetanyl upon admission. Patient has poor insight into her situation, believes that her child was taken away for nefarious reasons by CPS caseworker, who she endorses feeling homicidal thoughts towards. She was obviously responding to internal stimuli during the therapeutic conversation, and her thought content was quite disorganized with a very poor attention span. Patient repeatedly brought up her ability to perform witchcraft and to put voodoo spells on those who harm her. She also shared that she frequently communicates with evil spirits, but is able to decided when she listens to them or not. Regarding the positive tox screen, the patient was insistent that this is due to strangers "  shooting her up", and that she has never voluntarily taken drugs.    Differential at this time remains broad, in light of the patient's unclear psychiatric and medical history, lack of collateral information, and patient's limited ability to engage in conversation at this time. It does appear that patient has had at least 2 prior hospitalizations for psychotic symptoms, so this appears to be an enduring pattern and not an isolated event. Patient reported a history of a diagnosis of schizophrenia, which is certainly plausible given her current presentation. A psychosubstance- induced psychosis cannot be ruled out at this time given  the patient's tox screen and quick improvement since presenting to CPEP several days ago. Given that the patient is recently post-partum, a diagnosis of post-partum psychosis cannot be ruled out, although it appears that the patient had enduring delusions throughout her pregnancy, making this diagnosis less likely.    Updated Daily Assessment:    Patient initially showed mild improvement during this hospitalization, but somewhat decompensated when she stopped taking her scheduled Risperdal. Lexapro was discontinued due to concern for escalating reactivity/mania. Patient briefly agreed to resume risperdal, but then refused once again on 5/3. Patient continues to discuss witchcraft/voodoo and putting spells on CPS workers as well as members of the treatment team. Patient has not shown any improvement in her insight into her condition and situation while hospitalized, and continues to demonstrate poor judgement. [place] for TOO was held on 5/5, resulting in judge allowing team to treat patient over objections. Patient will benefit from continued admission for stabilization, medication management, and safe discharge planning.      Plan:  Psychosis, NOSvs. PTSD  -haloperidol 5 mg BID with court-ordered haloperidol 5 mg IM backup (treatment order: [id number]; granted 05/01/2022)  - benztropine 0.5 mg BID PRN EPS  - olanzapine 5mg  BID PRN for agitation    IM Recommendations: Haldol 5 mg, Ativan 2 mg, Benadryl 50 mg IM  Precautions: Escape Precautions, Violence Precautions and Suicide Precautions  Legal Status: 9.27 (expires 06/06/2022)    Reason(s) for Continued Stay: Unable to care for self in a less structured environment, Stabilization of gains and Need for safe discharge  Discharge Plan:  Pending further psychiatric stabilization    This is a medical student note. Please see resident/attending note for the final assessment and plan.    Author: Su Grand, Medical Student as of: 05/06/2022  at: 9:13 AM

## 2022-05-06 NOTE — Plan of Care (Signed)
Shift Note: 1600 to 0030    . Psychiatric Presentation (Mood, affect, milieu behaviors): Pt has been intermittently visible in the milieu, interacting minimally with peers. Pt stated that she was having a horrible day, stating "I still have a placenta inside of me. I'm a clean person, but it smells really bad." Pt reports she had a baby 2 months ago. Pt is smiling inappropriately as she discusses this. Pt is continuing to make frequent requests. Pt denies SI/HI/AVH. Pt compliant with HS medications per orders. Pt has remained cooperative with staff and maintained behavioral control.    . Safety Concerns: Pt denies SI/HI. She has not engaged in any violent or self injurious behaviors.    . Pain: Pt denies c/o pain or discomfort.    . Pt is performing ADLs: Independently;     . Internal stimuli: Patient denies    . Observed response to scheduled medications: All scheduled medications tolerated well, no adverse effects noted.    Mental Status Exam  Appearance: Groomed  Relationship to Interviewer: Cooperative, Friendly  Psychomotor Activity: Normal  Abnormal Movements: None  Muscle Strength and Tone: Normal  Station/Gait : Normal  Speech : Regular rate, Normal tone, Normal rhythm, Normal amount  Language: Fluent  Mood: Expansive  Affect: Appropriate  Thought Process: Illogical  Thought Content: Paranoia, No suicidal ideation, No homicidal ideation  Perceptions/Associations : No hallucinations  Sensorium: Alert  Cognition: Fair attention span  Progress Energy of Knowledge: Normal  Insight : Poor  Judgement: Poor     Problem: Risk for Suicide  Goal: Patient Will Be Safe And Free From Injury Throughout Hospitalization  Description: This goal applies for the duration of hospitalization  Outcome: Progressing towards goal

## 2022-05-06 NOTE — Progress Notes (Signed)
Psych Social Work Progress Note    Chart reviewed and Discussed patient with treatment team    Contact with: Family/Spouse/Partner/Informal support    Contact type: Telephone Contact    Purpose of Contact: Continue assessment    Narrative: Clinical research associate made T/C to Harriett Rush (SWer working with Stage manager for pt), but no answer. Writer left VM regarding update on pt, [facility] hearing for treatment over objection was granted on 05/01/22, and there is no discharge plan as of right now. Treatment team continues to monitor pt during continued psychiatric stabilization.     Next Steps: Continue to address identified Psychosocial Risk Factors  and Explore Referrals for additional supports for patient    Doreatha Lew, MSW

## 2022-05-06 NOTE — Plan of Care (Signed)
Shift Note: 0800 to 1630    . Psychiatric Presentation (Mood, affect, milieu behaviors): Presenting with elevated mood and appropriate affect.  Speech is pressured but interruptable.  Denies SI/HI/AVH/VI/EI.  Milieu activity is inappropriate, observed at the nursing station wearing underwear as a shirt, with bra visible, pt. Reminded of unit expectations, and agreeable. Pt. Able to make needs known.  Pt. Voicing some off illogical statements, "I had a miscarriage and my placenta still hasnt come out. It smells really bad." Pt. Reporting blood clots, not observed by Clinical research associate. Pt. Requested at writer remove her fish allergy from her chart, stating, "I sick of not getting chicken fingers and home fries."    . Safety Concerns: no safety concerns.    . Pain: denies pain    . Pt is performing ADLs: Independently and Without prompting;     . Pt-reported quality of sleep: Restful    . Internal stimuli: Patient denies    . Observed response to scheduled medications: tolerating well. No adverse side effects noted.      Mental Status Exam  Appearance: Other, Groomed (not dress appropriately)  Relationship to Interviewer: Cooperative, Eye contact good, Friendly, Engages well  Psychomotor Activity: Normal  Abnormal Movements: None  Muscle Strength and Tone: Normal  Station/Gait : Normal  Speech : Pressured  Language: Fluent, Normal comprehension  Mood: Elevated  Affect: Appropriate  Thought Process: Goal-directed, Illogical  Thought Content: No suicidal ideation, No homicidal ideation  Perceptions/Associations : No hallucinations  Sensorium: Alert  Cognition: Fair attention span  Progress Energy of Knowledge: Normal  Insight : Poor  Judgement: Poor      Problem: Risk for Suicide  Goal: Patient Will Be Safe And Free From Injury Throughout Hospitalization  Description: This goal applies for the duration of hospitalization  Outcome: Maintaining

## 2022-05-07 NOTE — Plan of Care (Signed)
Shift Note: 1230 to 0030    . Psychiatric Presentation (Mood, affect, milieu behaviors): Pt presents as dysphoric with appropriate affect. Seclusive to room sleeping for most of shift, waking when prompted by Clinical research associate. Stated "Haldol is not the drug for me. I'm too sleepy." Accepted PM scheduled Haldol, stating "I'll take it though so that way I don't have to take it tomorrow."      Mental Status Exam  Appearance: Appropriately dressed  Relationship to Interviewer: Cooperative  Psychomotor Activity: Normal  Abnormal Movements: None  Muscle Strength and Tone: Normal  Station/Gait : Normal  Speech : Regular rate, Normal tone, Normal rhythm  Language: Fluent, Normal comprehension  Mood: Dysphoric  Affect: Appropriate  Thought Process: Goal-directed  Thought Content: No suicidal ideation, No homicidal ideation  Perceptions/Associations : No hallucinations  Sensorium: Alert  Cognition: Fair attention span  Progress Energy of Knowledge: Normal  Insight : Poor  Judgement: Poor     Problem: Risk for Suicide  Goal: Patient Will Be Safe And Free From Injury Throughout Hospitalization  Description: This goal applies for the duration of hospitalization  Outcome: Maintaining

## 2022-05-07 NOTE — Plan of Care (Signed)
Problem: Risk for Suicide  Goal: Patient Will Be Safe And Free From Injury Throughout Hospitalization  Description: This goal applies for the duration of hospitalization  Outcome: Maintaining     Shift Note: 0830 to 1230    . Psychiatric Presentation (Mood, affect, milieu behaviors): Presents as euthymic with appropriate affect. Present in the milieu interacting well with others. Pt approached nurses station this morning and writer offered to get pt her medications. Pt stated "Should I take them you think", pt then stated "Yes I should, go get them." Pt accepted morning medication. Then stated "I need a med that doesn't make my thinking all weird. Never mind, I'll stick with this". Pt then asked writer about possible discharge dates, Clinical research associate encouraged pt to discuss this with her team. Pt observed by the front door utilizing a spoon to try and get the door opened. When writer approached pt, pt said "Oh hi, how are you today". Writer asked pt about trying to open the door and pt stated "Yeah, I'm trying to get out of here today. Even if I have to jump into one of those carts". Writer and pt talked about her eagerness to leave and pt agreed to take space from the door. Pt was accepting of PRN hydroxyzine with good effect.     . Safety Concerns: Denies    . Pain: Denies     . Pt is performing ADLs: Independently;     . Pt-reported quality of sleep: Restful    . Internal stimuli: Patient denies    . Observed response to scheduled medications: Moderate     Mental Status Exam  Appearance: Groomed  Relationship to Interviewer: Cooperative, Friendly  Psychomotor Activity: Normal  Abnormal Movements: None  Muscle Strength and Tone: Normal  Station/Gait : Normal  Speech : Regular rate, Normal tone, Normal rhythm, Normal amount  Language: Fluent, Normal comprehension, Normal repetition  Mood: Euthymic  Affect: Appropriate  Thought Process: Goal-directed  Thought Content: No suicidal ideation, No homicidal  ideation  Perceptions/Associations : No hallucinations  Sensorium: Alert  Cognition: Fair attention span  Progress Energy of Knowledge: Normal  Insight : Poor  Judgement: Poor

## 2022-05-07 NOTE — Progress Notes (Signed)
Inpatient Psychiatry  Progress Note   Length of Stay: 23 days    Significant Interval Events: Alexis Vang has been in poor-to-fair behavioral control and had no acute events overnight. Per nursing, she was noted to be elevated, pressured, disorganized, and reporting she has a placenta inside of her. At one point, she was observed at the nursing station wearing underwear as a shirt. Alexis Vang has been adherent with her court-ordered scheduled medication, haloperidol, and did utilize Eucerin cream as a PRN medication for dry skin.     Subjective:   Alexis Vang was seen and examined this morning by the treatment team. Reports she feels ok. Asks when she will be discharged. Does not report any side effects from current regimen. Denies SI/HI but notes she could "sneak up on people like a sniper." Reiterates she has no thoughts about actually killing anybody. Reports previously taking [place] and she gained so much weight she was "2 donuts away from obesity." Also reports taking Abilify Maintenna. States she is amenable to a haloperidol decanoate LAI and asks when she can get it. Reiterates her desire for discharge and advocates for discharge to [facility]. Reports talking to a number of spirits and that one snuck into her room. Mentions something about resurrecting someone but that she is struggling with whether it's a good idea or not to do so.    Review of Systems   Constitutional: Negative for diaphoresis.   HENT: Negative for nosebleeds.    Eyes: Negative for redness.   Respiratory: Negative for cough.    Cardiovascular: Negative for leg swelling.   Gastrointestinal: Negative for vomiting.   Genitourinary: Negative for frequency.   Musculoskeletal: Negative for falls.   Skin: Negative for rash.   Neurological: Negative for seizures.   Psychiatric/Behavioral: Negative for depression, hallucinations and suicidal ideas. The patient does not have insomnia.       Objective:   Vitals:  BP 96/66 (BP Location: Left arm)   Pulse 62    Temp 36.4 C (97.5 F) (Infrared)   Resp 16   Ht 1.702 m (5\' 7" )   Wt 65.8 kg (145 lb)   SpO2 100%   BMI 22.71 kg/m   Wt Readings from Last 1 Encounters:   04/12/22 65.8 kg (145 lb)      Mental Status Exam:  Appearance: Appears stated age and Dressed in Associate Professor  Attitude/Behavior: Cooperative  Motor Activity: No Abnormal Movements  Eye Contact: Direct  Speech: Somewhat increased in rate and amount, particularly as conversation progressed  Mood: "Ok"  Affect: Largely euthymic though with some lability  Thought Process: Goal Directed and Somewhat disorganized  Thought Content: Delusions  Perception: No clear evidence of hallucinations  Current Suicidal Ideation: Denies  Current Homicidal Ideation: Denies  Concentration: Poor-to-fair  Cognition:  Intact  Judgement: Poor  Insight: Poor    Physical Exam:  General: Appears stated age, NAD  Respiratory: Normal effort, No wheezing  Skin: No rashes or wounds  Neurologic: Alert, Awake    Current Medications:  . haloperidol  5 mg Oral Daily    Or   . haloperidol lactate  2.5 mg Intramuscular Daily   . haloperidol  5 mg Oral Nightly    Or   . haloperidol lactate  2.5 mg Intramuscular Nightly   . multivitamin with minerals-trace elements  1 tablet Oral Daily   . melatonin  3 mg Oral Daily @ 1800           . benztropine  0.5 mg Oral  BID PRN   . eucerin   Topical PRN   . acetaminophen  1,000 mg Oral TID PRN   . ibuprofen  400 mg Oral Q6H PRN   . OLANZapine  5 mg Oral BID PRN   . aluminum & magnesium hydroxide w/simethicone  30 mL Oral Q8H PRN   . magnesium hydroxide  30 mL Oral Daily PRN   . hydrOXYzine HCl  50 mg Oral TID PRN       Pertinent Labs:    Labs:  Endocrine:       Lab results: 04/29/22  1942   Hemoglobin A1C 5.0     EKG:    04/12/2022   QTc 459        Impression:   Alexis Vang Hooveris a 31 y.o.femalewith a reported past history of schizophrenia, unspecified mood disorder, PTSD and ADHDwho presented to the hospital voluntarilywith suicidal ideation and  homicidal ideationin the context of active substance use, housing instabiltiy and medication/treatment non-adherence.    Today, Alexis Vang remains somewhat disorganized and with persistent delusions. However, the latter are unlikely to remit during this hospitalization. She appears to be tolerating her haloperidol without any clear adverse side effects and is amenable to an LAI. Will explore possibility of LAI while examining potential disposition options. Given the above, Alexis Vang will benefit from continued admission for further monitoring, stabilization, medication management, and safe discharge planning.     Psychiatric Diagnostic Impression:   Active Problems:    Psychosis, unspecified psychosis type    Plan:   Psychosis, NOSvs. PTSD  -haloperidol 5 mg BID with court-ordered haloperidol 2.5 mg IM backup (treatment order: [id number]; granted 05/01/2022)  - benztropine 0.5 mg BID PRN EPS  - olanzapine 5mg  BID PRN for agitation    IM Recommendations: Haldol 5 mg, Ativan 2 mg, Benadryl 50 mg IM  Precautions: Escape Precautions, Violence Precautions and Suicide Precautions  Legal Status: 9.27 (expires 06/06/2022)    Reason(s) for Continued Stay: Unable to care for self in a less structured environment, Stabilization of gains and Need for safe discharge  Discharge Plan:  Pending further psychiatric stabilization    Wilberta Dorvil Ralene Muskrat, DO   Psychiatry - PGY 2  05/07/22 at 7:31 AM

## 2022-05-07 NOTE — Student Note (Signed)
Medical Student Psychiatry Inpatient Daily Progress Note    LOS: 23 days    Interim Events:   -NAOE, fair behavioral control has had some bizarre statements to nurses about a retained placenta  -Accepted all scheduled medications, no PRN usage    Subjective:   This morning, patient in good mood, very friendly with treatment team. Patient is very funny, able to tell jokes conversationally. Patient stating that she would like to get a LAI because "I'll be honest with you as soon as I get out of here I'm not taking any pills". Patient has gotten [place] and [employer] in the past, but stated that she had a lot of weight gain and was "two doughnuts away from being obese". Patient stated that the Haldol is working well so far and would like the LAI version of that.     Patient showed good insight into situation with her son. Stated that she knows that she could hurt others if she wanted to but has decided not to Dineen Kid not going to see me on 48 hours, that would be a disgrace"). Patient is happy that her son is going with her sister and that he will be safe there. Is worried that when she gets out she will immediately start drinking again, which she knows is not good for her. Patient refused offered assistance with recovery programs at this time.     Review of systems:  New symptoms:   Physical complaints: Hand pain/puritis, given topical kenalog ointment; dry skin, given eucerin cream  Vitals:  BP: (97)/(62)   Temp:  [36.4 C (97.5 F)]   Temp src: Infrared (05/10 0846)  Heart Rate:  [83]   Resp:  [16]   SpO2:  [100 %]      Mental Status Exam:  Appearance: Wearing personal clothing, slightly dishelved  Attitude/Behavior: Pleasant, conversational, witty  Motor Activity: None  Muscle Tone: Normal  Eye Contact: Good  Language: Fluent/comprehensible  Speech: Normal rate, rhythm, volume, amount  Mood: "I'm good but when can I leave?"  Affect: Euthymic   Thought Process: Organized,   Thought Content: Denies  SI/HI, speaks of witchcraft/ability to put spells  Perception: Did not appear to be responding to internal stimuli  Orientation: Oriented to person, place, time  Cognition: Intact  Insight: Fair  Judgement: Fair, improved    Pertinent Labs/EKG/Imaging:   -No new labs    Current Medications:  Scheduled Meds:  . haloperidol  5 mg Oral Daily    Or   . haloperidol lactate  2.5 mg Intramuscular Daily   . haloperidol  5 mg Oral Nightly    Or   . haloperidol lactate  2.5 mg Intramuscular Nightly   . multivitamin with minerals-trace elements  1 tablet Oral Daily   . melatonin  3 mg Oral Daily @ 1800     PRN Meds:  . benztropine  0.5 mg Oral BID PRN   . eucerin   Topical PRN   . acetaminophen  1,000 mg Oral TID PRN   . ibuprofen  400 mg Oral Q6H PRN   . OLANZapine  5 mg Oral BID PRN   . aluminum & magnesium hydroxide w/simethicone  30 mL Oral Q8H PRN   . magnesium hydroxide  30 mL Oral Daily PRN   . hydrOXYzine HCl  50 mg Oral TID PRN       Assessment:  Alexis Vang is a 31 y.o. female 1 month PP with unclear psychiatric history and SUD who  voluntary presented to [facility] due to SI/HI in the setting of active substance use and housing instability. Her current overwhelming stressor is the placement of her newborn son into foster care, related to her active substance use during pregnancy and lack of psychiatric care. Since her son's placement, patient left [facility] program and is now experiencing housing instability, staying with various people where the patient does not feel safe. Her urine toxin screen was positive for cocaine and fetanyl upon admission. Patient has poor insight into her situation, believes that her child was taken away for nefarious reasons by CPS caseworker, who she endorses feeling homicidal thoughts towards. She was obviously responding to internal stimuli during the therapeutic conversation, and her thought content was quite disorganized with a very poor attention span. Patient repeatedly brought up  her ability to perform witchcraft and to put voodoo spells on those who harm her. She also shared that she frequently communicates with evil spirits, but is able to decided when she listens to them or not. Regarding the positive tox screen, the patient was insistent that this is due to strangers "shooting her up", and that she has never voluntarily taken drugs.    Differential at this time remains broad, in light of the patient's unclear psychiatric and medical history, lack of collateral information, and patient's limited ability to engage in conversation at this time. It does appear that patient has had at least 2 prior hospitalizations for psychotic symptoms, so this appears to be an enduring pattern and not an isolated event. Patient reported a history of a diagnosis of schizophrenia, which is certainly plausible given her current presentation. A psychosubstance- induced psychosis cannot be ruled out at this time given the patient's tox screen and quick improvement since presenting to [facility] several days ago. Given that the patient is recently post-partum, a diagnosis of post-partum psychosis cannot be ruled out, although it appears that the patient had enduring delusions throughout her pregnancy, making this diagnosis less likely.    Updated Daily Assessment:    Patient initially showed mild improvement during this hospitalization, but somewhat decompensated when she stopped taking her scheduled Risperdal. Lexapro was discontinued due to concern for escalating reactivity/mania. Patient briefly agreed to resume risperdal, but then refused once again on 5/3. Patient continues to discuss witchcraft/voodoo and putting spells on CPS workers as well as members of the treatment team. [facility] hearing for [facility] was held on 5/5, resulting in judge allowing team to treat patient over objections. Patient will benefit from continued admission for stabilization, medication management, and safe discharge planning. Patient has  displayed some improved insight and judgement over the past few days, stating that she would like to receive an LAI because she does not believe she would be able to be compliant with daily medications outside the hospital. Patient still does not believe that she needs the medications, but is instead hoping that this can help her get discharged sooner. Patient also seems to not be as agitated and worried about her son, is happy that he is going to her sister, where she believes that he will be safe. SW to looked into LAI coordination, and possible discharge to [facility].      Plan:  Psychosis, NOSvs. PTSD  -haloperidol 5 mg BID with court-ordered haloperidol 5 mg IM backup (treatment order: [id number]; granted 05/01/2022)  - benztropine 0.5 mg BID PRN EPS  - olanzapine 5mg  BID PRN for agitation    IM Recommendations: Haldol 5 mg, Ativan 2 mg, Benadryl 50 mg  IM  Precautions: Escape Precautions, Violence Precautions and Suicide Precautions  Legal Status: 9.27 (expires 06/06/2022)    Reason(s) for Continued Stay: Unable to care for self in a less structured environment, Stabilization of gains and Need for safe discharge  Discharge Plan:  Pending further psychiatric stabilization    This is a medical student note. Please see resident/attending note for the final assessment and plan.    Author: Su Grand, Medical Student as of: 05/07/2022  at: 11:27 AM

## 2022-05-08 MED ORDER — BENZTROPINE MESYLATE 1 MG PO TABS *I*
1.0000 mg | ORAL_TABLET | Freq: Two times a day (BID) | ORAL | Status: DC | PRN
Start: 2022-05-08 — End: 2022-05-14

## 2022-05-08 MED ORDER — HALOPERIDOL DECANOATE 100 MG/ML IM SOLN *I*
100.0000 mg | Freq: Once | INTRAMUSCULAR | Status: AC
Start: 2022-05-08 — End: 2022-05-08
  Administered 2022-05-08: 100 mg via INTRAMUSCULAR
  Filled 2022-05-08: qty 1

## 2022-05-08 NOTE — Plan of Care (Signed)
Shift Note: 0430 to 1630    . Psychiatric Presentation (Mood, affect, milieu behaviors): Pt presents as dysphoric with appropriate affect. Initially refused morning medications stating, "Haldol keeps giving me a reaction, I'm allergic to it. You can give me the injection if you want but I'm not going down without a fight. Actually, you can give it to me but I don't want anyone else there." After talking with the team and with significant staff support, pt was able to take the PO scheduled medications with good effect. Got LAI with great effect. Pt remained calm and in behavioral control throughout the rest of the shift. Good PO intake and was able to make needs known. Denies SI/HI/AVH and pain.      Mental Status Exam  Appearance: Appropriately dressed  Relationship to Interviewer: Cooperative  Psychomotor Activity: Normal  Abnormal Movements: None  Muscle Strength and Tone: Normal  Station/Gait : Normal  Speech : Regular rate, Normal tone, Normal amount  Language: Fluent, Normal comprehension  Mood: Dysphoric  Affect: Appropriate  Thought Process: Goal-directed  Thought Content: No suicidal ideation, No homicidal ideation  Perceptions/Associations : No hallucinations  Sensorium: Alert  Cognition: Fair attention span  Progress Energy of Knowledge: Normal  Insight : Poor  Judgement: Poor     Problem: Risk for Suicide  Goal: Patient Will Be Safe And Free From Injury Throughout Hospitalization  Description: This goal applies for the duration of hospitalization  Outcome: Maintaining

## 2022-05-08 NOTE — Plan of Care (Signed)
Shift Note: 1630 to 0030    . Psychiatric Presentation (Mood, affect, milieu behaviors): Pt presents as euthymic with appropriate affect. Present in milieu, dancing and interacting well with peers. Politely makes needs known. Stated "I'm getting out of here in 5 days." Utilized PRN eucerin cream x2 for dry skin on hands. Accepting of scheduled medications.     Mental Status Exam  Appearance: Appropriately dressed  Relationship to Interviewer: Cooperative, Friendly  Psychomotor Activity: Normal  Abnormal Movements: None  Muscle Strength and Tone: Normal  Station/Gait : Normal  Speech : Regular rate, Normal tone, Normal rhythm  Language: Fluent, Normal comprehension  Mood: Euthymic  Affect: Appropriate  Thought Process: Goal-directed  Thought Content: No suicidal ideation, No homicidal ideation  Perceptions/Associations : No hallucinations  Sensorium: Alert  Cognition: Fair attention span  Progress Energy of Knowledge: Normal  Insight : Improving  Judgement: Fair     Problem: Risk for Suicide  Goal: Patient Will Be Safe And Free From Injury Throughout Hospitalization  Description: This goal applies for the duration of hospitalization  Outcome: Maintaining
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
## 2022-05-08 NOTE — Progress Notes (Addendum)
Inpatient Psychiatry  Progress Note   Length of Stay: 24 days    Significant Interval Events: Alexis Vang has been in poor-to-fair behavioral control and had no acute events overnight. Per nursing, she was noted to be somewhat disorganized. At one point she was observed at the front door utilizing a spoon trying to get the door opened. Alexis Vang has been adherent with her court-ordered scheduled medication, haloperidol, and did utilize hydroxyzine as a PRN medication for anxiety.     Subjective:   Alexis Vang was seen and examined this morning by Clinical research associate. Notes she spoke with rest of treatment team earlier today. With a smile on her face, acknowledges plan for 1st dose of LAI today with next dose next week and tentative discharge after pending tolerability. Production assistant, radio unprompted. Voices no questions or concerns. Does not report any side effects from haloperidol. Agrees to 1st dose of haloperidol decanoate this afternoon.    Review of Systems   Constitutional: Negative for diaphoresis.   HENT: Negative for nosebleeds.    Eyes: Negative for redness.   Respiratory: Negative for cough.    Cardiovascular: Negative for leg swelling.   Gastrointestinal: Negative for vomiting.   Genitourinary: Negative for frequency.   Musculoskeletal: Negative for falls.   Skin: Negative for rash.   Neurological: Negative for seizures.   Psychiatric/Behavioral: Negative for depression, hallucinations and suicidal ideas. The patient does not have insomnia.       Objective:   Vitals:  BP 97/62 (BP Location: Left arm)   Pulse 83   Temp 36.4 C (97.5 F) (Infrared)   Resp 16   Ht 1.702 m (5\' 7" )   Wt 65.8 kg (145 lb)   SpO2 100%   BMI 22.71 kg/m   Wt Readings from Last 1 Encounters:   04/12/22 65.8 kg (145 lb)      Mental Status Exam:  Appearance: Appears stated age and Dressed in Associate Professor  Attitude/Behavior: Cooperative  Motor Activity: No Abnormal Movements  Eye Contact: Direct  Speech: Largely normal rate, rhythm, amount, and  tone  Mood: "I'm so happy"  Affect: Largely euthymic  Thought Process: Goal Directed  Thought Content: No overtly unusual themes elicited  Perception: No Evidence of Hallucinations  Current Suicidal Ideation: Denies  Current Homicidal Ideation: Denies  Concentration: Fair  Cognition:  Intact  Judgement: Poor-to-fair  Insight: Poor    Physical Exam:  General: Appears stated age, NAD  Respiratory: Normal effort, No wheezing  Skin: No rashes or wounds  Neurologic: Alert, Awake    Current Medications:  . haloperidol  5 mg Oral Daily    Or   . haloperidol lactate  2.5 mg Intramuscular Daily   . haloperidol  5 mg Oral Nightly    Or   . haloperidol lactate  2.5 mg Intramuscular Nightly   . multivitamin with minerals-trace elements  1 tablet Oral Daily   . melatonin  3 mg Oral Daily @ 1800           . benztropine  0.5 mg Oral BID PRN   . eucerin   Topical PRN   . acetaminophen  1,000 mg Oral TID PRN   . ibuprofen  400 mg Oral Q6H PRN   . OLANZapine  5 mg Oral BID PRN   . aluminum & magnesium hydroxide w/simethicone  30 mL Oral Q8H PRN   . magnesium hydroxide  30 mL Oral Daily PRN   . hydrOXYzine HCl  50 mg Oral TID PRN  Pertinent Labs:    Labs:  Endocrine:       Lab results: 04/29/22  1942   Hemoglobin A1C 5.0     EKG:    04/12/2022   QTc 459        Impression:   Alexis Vang a 31 y.o.femalewith a reported past history of schizophrenia, unspecified mood disorder, PTSD and ADHDwho presented to the hospital voluntarilywith suicidal ideation and homicidal ideationin the context of active substance use, housing instabiltiy and medication/treatment non-adherence.    Today, Alexis Vang has been reported as displaying some disorganization but overall continues to show gains from her oral haloperidol and is amenable to receiving her first haloperidol decanoate LAI today. She has not displayed any clear side effects that can be attributed to haloperidol. Assuming she tolerates the initial haloperidol LAI, will proceed  with 2nd dose next week with tentative plan for discharge following. Given the above, Alexis Vang will benefit from continued admission for further monitoring, stabilization, medication management, and safe discharge planning.     Psychiatric Diagnostic Impression:   Active Problems:    Psychosis, unspecified psychosis type    Plan:   Psychosis, NOSvs. PTSD  -haloperidol decanoate 100 mg LAI today; tentative plan for 2nd dose in ~5 days  - haloperidol 5 mg qhs with court-ordered haloperidol 2.5 mg IM backup (treatment order: [id number]; granted 05/01/2022)  - benztropine 1 mg BID PRN EPS  - olanzapine 5mg  BID PRN for agitation    IM Recommendations: Haldol 5 mg, Ativan 2 mg, Benadryl 50 mg IM  Precautions: Escape Precautions, Violence Precautions and Suicide Precautions  Legal Status: 9.27 (expires 06/06/2022)    Reason(s) for Continued Stay: Unable to care for self in a less structured environment, Stabilization of gains and Need for safe discharge  Discharge Plan:  Pending further psychiatric stabilization    Aviannah Castoro Ralene Muskrat, DO   Psychiatry - PGY 2  05/08/22 at 7:59 AM
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
## 2022-05-09 NOTE — Plan of Care (Signed)
Shift Note: 0830 to 1230    Psychiatric Presentation (Mood, affect, milieu behaviors): Pt presents as euthymic with appropriate affect. Present in milieu interacting well with peers. Denies SI/HI/AVH and pain. Pt excited to leave next week. Complained of urinary incontinence overnight stating, "I was suddenly just peeing and I couldn't stop it. Thank god I was wearing a depends." Pt remains in behavioral control and is able to make needs known. Good PO intake. Adherent with scheduled medications.       PRN Use: No PRNs utilized    Mental Status Exam  Appearance: Appropriately dressed  Relationship to Interviewer: Cooperative, Eye contact good, Friendly  Psychomotor Activity: Normal  Abnormal Movements: None  Muscle Strength and Tone: Normal  Station/Gait : Normal  Speech : Regular rate, Normal tone, Normal amount  Language: Fluent, Normal comprehension  Mood: Euthymic  Affect: Appropriate  Thought Process: Goal-directed  Thought Content: No suicidal ideation, No homicidal ideation  Perceptions/Associations : No hallucinations  Sensorium: Alert  Cognition: Fair attention span  Progress Energy of Knowledge: Normal  Insight : Improving  Judgement: Improving     Problem: Risk for Suicide  Goal: Patient Will Be Safe And Free From Injury Throughout Hospitalization  Description: This goal applies for the duration of hospitalization  Outcome: Maintaining

## 2022-05-09 NOTE — Progress Notes (Signed)
Inpatient Psychiatry  Progress Note   Length of Stay: 25 days    Significant Interval Events: Alexis Vang has been in fair behavioral control and had no acute events overnight. Per nursing, she was noted to be somewhat disorganized but overall interacting well with peers. Alexis Vang has been adherent with her court-ordered scheduled medication, haloperidol, and did utilize Eucerin cream as a PRN medication for dry skin. She received her first dose of haloperidol LAI yesterday.     Subjective:   Alexis Vang was seen and examined this morning by Clinical research associate. She reports she's looking forward to discharge next week. States she has already made arrangements for discharge. Does not elaborate on what these arrangements are. Shares she had an episode of incontinence last night and questions whether this could be due to the Haldol decanoate injection she received yesterday but is reassured this is not a known side effect of this medication. Otherwise, voices no questions or concerns.    Review of Systems   Constitutional: Negative for diaphoresis.   HENT: Negative for nosebleeds.    Eyes: Negative for redness.   Respiratory: Negative for cough.    Cardiovascular: Negative for leg swelling.   Gastrointestinal: Negative for vomiting.   Genitourinary: Negative for frequency.   Musculoskeletal: Negative for falls.   Skin: Negative for rash.   Neurological: Negative for seizures.   Psychiatric/Behavioral: Negative for depression, hallucinations and suicidal ideas. The patient does not have insomnia.       Objective:   Vitals:  BP 91/58 (BP Location: Left arm)   Pulse 98   Temp 36.4 C (97.5 F) (Temporal)   Resp 16   Ht 1.702 m (5\' 7" )   Wt 65.8 kg (145 lb)   SpO2 100%   BMI 22.71 kg/m   Wt Readings from Last 1 Encounters:   04/12/22 65.8 kg (145 lb)      Mental Status Exam:  Appearance: Appears stated age and Dressed in Associate Professor  Attitude/Behavior: Cooperative  Motor Activity: No Abnormal Movements  Eye Contact: Direct  Speech:  Largely normal rate, rhythm, amount, and tone  Mood: "Good"  Affect: Mildly elevated  Thought Process: Largely linear and goal-directed  Thought Content: No overtly unusual themes elicited  Perception: No clear evidence of hallucinations  Current Suicidal Ideation: Denies  Current Homicidal Ideation: Denies  Concentration: Fair  Cognition:  Intact  Judgement: Fair  Insight: Poor    Physical Exam:  General: Appears stated age, NAD  Respiratory: Normal effort, No wheezing  Skin: No rashes or wounds  Neurologic: Alert, Awake    Current Medications:  . haloperidol lactate  2.5 mg Intramuscular Daily   . haloperidol  5 mg Oral Nightly    Or   . haloperidol lactate  2.5 mg Intramuscular Nightly   . multivitamin with minerals-trace elements  1 tablet Oral Daily   . melatonin  3 mg Oral Daily @ 1800           . benztropine  1 mg Oral BID PRN   . eucerin   Topical PRN   . acetaminophen  1,000 mg Oral TID PRN   . ibuprofen  400 mg Oral Q6H PRN   . OLANZapine  5 mg Oral BID PRN   . aluminum & magnesium hydroxide w/simethicone  30 mL Oral Q8H PRN   . magnesium hydroxide  30 mL Oral Daily PRN   . hydrOXYzine HCl  50 mg Oral TID PRN       Pertinent Labs:  Labs:  Endocrine:       Lab results: 04/29/22  1942   Hemoglobin A1C 5.0     EKG:    04/12/2022   QTc 459        Impression:   Alexis Vang a 31 y.o.femalewith a reported past history of schizophrenia, unspecified mood disorder, PTSD and ADHDwho presented to the hospital voluntarilywith suicidal ideation and homicidal ideationin the context of active substance use, housing instabiltiy and medication/treatment non-adherence.    Today, Alexis Vang remains somewhat disorganized and mildly elevated but otherwise continues to show gains from her haloperidol and appears to have tolerated the LAI without any adverse effects exhibited or endorsed. Plan remains for second dose next week with tentative plan for discharge following this. Given the above, Alexis Vang will benefit from  continued admission for further monitoring, stabilization, medication management, and safe discharge planning.     Psychiatric Diagnostic Impression:   Active Problems:    Psychosis, unspecified psychosis type    Plan:   Psychosis, NOSvs. PTSD  -Plan for 2nd haloperidol decanoate dose in ~4 days  - haloperidol 5 mg qhs with court-ordered haloperidol 2.5 mg IM backup (treatment order: [id number]; granted 05/01/2022)  - benztropine 1 mg BID PRN EPS  - olanzapine 5mg  BID PRN for agitation    IM Recommendations: Haldol 5 mg, Ativan 2 mg, Benadryl 50 mg IM  Precautions: Escape Precautions, Violence Precautions and Suicide Precautions  Legal Status: 9.27 (expires 06/06/2022)    Reason(s) for Continued Stay: Unable to care for self in a less structured environment, Stabilization of gains and Need for safe discharge  Discharge Plan:  Pending further psychiatric stabilization    Alexis Posada Ralene Muskrat, DO   Psychiatry - PGY 2  05/09/22 at 7:49 AM

## 2022-05-10 NOTE — Plan of Care (Signed)
1600 to 0030  Patient making many requests. Denies SI HI AVH. Elevated, consistently at the nursing station. Patient endorsing escape ideation, saying she will slip out with laundry or staff. Excited that she has a lighter in her belongings, stating "now I know I can smoke when I get out, I didn't want to ask somebody to buy me one". Hyperfocused on laundry, asking to do multiple loads despite already having completed multiple earlier in the day. Came to nursing station requesting "all the medications" and was given PRN tylenol and zyprexa with positive effect. Patient retired to bed early and watched [employer]+. Did cover hands in lotion, wrap underwear around them, and then tape socks over her hands.   Problem: Risk for Suicide  Goal: Patient Will Be Safe And Free From Injury Throughout Hospitalization  Description: This goal applies for the duration of hospitalization  Outcome: Maintaining

## 2022-05-10 NOTE — Progress Notes (Incomplete)
Colposcopy Clinic     31 y.o. [phone] female with Pap in February demonstrating NILM, +HPV HR other    Risk Factors:    Tobacco: {Smoker?:15292::"Never smoker"}  Prior STDs: {DIAGNOSES; STD:22604::"Denies"}   HIV status: negative 04/12/22    Dysplasia History:   02/20/22: Pap NILM, +HPV HR other

## 2022-05-10 NOTE — Plan of Care (Signed)
Received call from staff about a duplicated IM haldol order in the AM. Linked order noted in the evening. Clarified with covering provider, AM haldol IM discontinued

## 2022-05-10 NOTE — Plan of Care (Signed)
Shift Note: 0800 to 1630    Psychiatric Presentation (Mood, affect, milieu behaviors): Presenting with euthymic mood and appropriate affect.  Speech is regular.  Denies SI/HI/AVH/VI/EI.  Milieu activity is appropriate, having positive interactions with staff, able to make needs known, adherent with unit expectations. Pt. Complains of irritation to bilateral hands, appears dry, cracked, slight swollen at the knuckles, raised bumps with clear fluid.    Safety Concerns: no safety concerns.    Pain: denies pain    Pt is performing ADLs: Independently and Without prompting;     Pt-reported quality of sleep: Restful    Internal stimuli: Patient denies    Observed response to scheduled medications: tolerating well. No adverse side effects noted.    Mental Status Exam  Appearance: Appropriately dressed, Groomed  Relationship to Interviewer: Cooperative, Eye contact good, Friendly, Engages well  Psychomotor Activity: Normal  Abnormal Movements: None  Muscle Strength and Tone: Normal  Station/Gait : Normal  Speech : Regular rate, Normal tone, Normal rhythm, Normal amount  Language: Fluent, Normal comprehension  Mood: Euthymic  Affect: Appropriate  Thought Process: Goal-directed, Sequential, Logical  Thought Content: No suicidal ideation, No homicidal ideation  Perceptions/Associations : No hallucinations  Sensorium: Alert  Cognition: Recent memory intact, Fair attention span  Progress Energy of Knowledge: Normal  Insight : Improving, Fair  Judgement: Fair, Improving      Problem: Risk for Suicide  Goal: Patient Will Be Safe And Free From Injury Throughout Hospitalization  Description: This goal applies for the duration of hospitalization  Outcome: Maintaining

## 2022-05-11 NOTE — Plan of Care (Signed)
Shift Note: 0800 to 2030    Psychiatric Presentation (Mood, affect, milieu behaviors): Presenting with euthymic mood and appropriate affect.  Speech is regular.  Denies SI/HI/AVH/VI, endorsing EI, evidenced by testing exit doors and stating, "I know your not a lesbian but I will do anything you want me to if you just let me go right now" and screaming in the milieu, "Let me go, let me the fuck go." Milieu activity is somewhat disorganized at times, flirtatious with peers, observed twerking in hall directed towards peers, reminded of unit expectations. Pt. Improving in insight telling writer, "Im here because I said I wanted to kill someone." Pt. Able to make needs known, and in decent behavioral control.    Safety Concerns: no safety concerns.    Pain: denies pain    Pt is performing ADLs: Independently and Without prompting;     Pt-reported quality of sleep: Restful    Internal stimuli: Patient denies    Observed response to scheduled medications: tolerating well. No adverse side effects noted.      Mental Status Exam  Appearance: Appropriately dressed, Groomed  Relationship to Interviewer: Cooperative, Eye contact good, Friendly, Engages well  Psychomotor Activity: Normal  Abnormal Movements: None  Muscle Strength and Tone: Normal  Station/Gait : Normal  Speech : Regular rate, Normal tone, Normal rhythm, Normal amount  Language: Fluent, Normal comprehension  Mood: Euthymic  Affect: Appropriate, Euthymic  Thought Process: Goal-directed, Sequential, Logical  Thought Content: No suicidal ideation, No homicidal ideation  Perceptions/Associations : No hallucinations  Sensorium: Alert  Cognition: Good attention span  Fund of Knowledge: Normal  Insight : Good, Improving  Judgement: Fair, Improving      Problem: Risk for Suicide  Goal: Patient Will Be Safe And Free From Injury Throughout Hospitalization  Description: This goal applies for the duration of hospitalization  Outcome: Maintaining

## 2022-05-12 ENCOUNTER — Telehealth: Payer: Self-pay

## 2022-05-12 ENCOUNTER — Other Ambulatory Visit: Payer: Self-pay

## 2022-05-12 MED ORDER — HALOPERIDOL DECANOATE 100 MG/ML IM SOLN *I*
100.0000 mg | INTRAMUSCULAR | Status: AC
Start: 2022-05-13 — End: 2022-05-13
  Administered 2022-05-13: 100 mg via INTRAMUSCULAR
  Filled 2022-05-12: qty 0.5

## 2022-05-12 NOTE — Telephone Encounter (Signed)
Patient scheduled for Colposcopy today. Patient is currently admitted inpatient. Appointment cancelled and message routed for rescheduling once patient is discharged.

## 2022-05-12 NOTE — Plan of Care (Signed)
Shift Note: 0830 to 2030    . Psychiatric Presentation (Mood, affect, milieu behaviors): Pt presents as euthymic, expansive with appropriate affect. Present in milieu interacting well with peers. Stated "I gotta get my shot today. I got things to do." Pt then whispered in Writer's ear "I boost!" Explained this as "Say you see ae nice black blouse for $50. You get it for $25 through me." Denying SI/HI/AVH. Accepting of scheduled medications. In afternoon, pt had verbal altercation with peer D.M. Both pts making verbal threats at each other, requiring staff intervention. Pt stated "you a god damn ant talking to a mother fucking dinosaur. You lucky I'm not on you because they wouldn't be able to peel me off you! I'm lethal as a bitch!" Pt then stated "Get me some olanzapine." Offered and accepted PRN olanzapine and hydroxyzine with moderate effect. Stated "I'm supposed to get out of here. You're not [employer] with that! I'm a single mother!" Other patients encouraged pt to return to dining room and play games with them, and pt stated "I'm calm. I'm gonna sit here and play my game." Maintained behavioral control and interacted well with other patients. Cooperative with room change. Utilized PRN eucerin cream for dry skin on hands. Accepting of scheduled PM Haldol.     Mental Status Exam  Appearance: Appropriately dressed  Relationship to Interviewer: Cooperative, Friendly, Engages well  Psychomotor Activity: Normal  Abnormal Movements: None  Muscle Strength and Tone: Normal  Station/Gait : Normal  Speech : Regular rate, Normal tone, Normal rhythm, Normal amount  Language: Fluent, Normal comprehension  Mood: Euthymic, Expansive  Affect: Appropriate  Thought Process: Goal-directed, Sequential, Logical  Thought Content: No suicidal ideation, No homicidal ideation  Perceptions/Associations : No hallucinations  Sensorium: Alert  Cognition: Fair attention span  Progress Energy of Knowledge: Normal  Insight : Fair  Judgement: Fair      Problem: Risk for Suicide  Goal: Patient Will Be Safe And Free From Injury Throughout Hospitalization  Description: This goal applies for the duration of hospitalization  Outcome: Maintaining

## 2022-05-12 NOTE — Progress Notes (Addendum)
Inpatient Psychiatry  Progress Note   Length of Stay: 28 days    Significant Interval Events: Alexis Vang has been in poor-to-fair behavioral control and had no acute events overnight. Per nursing, she was noted to be elevated, irritable, impulsive, disorganized, and periodically testing the exit doors/attempting to elope. Alexis Vang has been adherent with her court-ordered scheduled medication, haloperidol, and did utilize acetaminophen, ibuprofen, and Eucerin cream as a PRN medications for pain and dry skin.      Subjective:   Alexis Vang was seen and examined this morning by Clinical research associate. She repeatedly insists on wanting to leave today. She asks attending multiple times to call her bank and confirm she has money, noting she wants to stay at a hotel. Excitedly shares she bought her mother drugs for mother's day. Adamantly denies having schizophrenia. States she wants to be on the right medicine, then states Haldol is the right medicine. Pushing for discharge today but ultimately amenable to receiving LAI tomorrow with discharge on Wednesday.    Review of Systems   Constitutional: Negative for diaphoresis.   HENT: Negative for nosebleeds.    Eyes: Negative for redness.   Respiratory: Negative for cough.    Cardiovascular: Negative for leg swelling.   Gastrointestinal: Negative for vomiting.   Genitourinary: Negative for frequency.   Musculoskeletal: Negative for falls.   Skin: Negative for rash.   Neurological: Negative for seizures.   Psychiatric/Behavioral: Negative for depression, hallucinations and suicidal ideas. The patient does not have insomnia.       Objective:   Vitals:  BP 111/86 (BP Location: Right arm)   Pulse 99   Temp 36.1 C (97 F) (Infrared)   Resp 16   Ht 1.702 m (5\' 7" )   Wt 65.8 kg (145 lb)   SpO2 98%   BMI 22.71 kg/m   Wt Readings from Last 1 Encounters:   04/12/22 65.8 kg (145 lb)      Mental Status Exam:  Appearance: Appears stated age, Disheveled and Dressed in Associate Professor  Attitude/Behavior:  Generally cooperative but frequently speaking over team  Motor Activity: No Abnormal Movements  Eye Contact: Direct  Speech: Increased amount and difficult to interrupt at times  Mood: "I want to be discharged today"  Affect: Mildly irritable, somewhat elevated  Thought Process: Goal Directed and Perseverative  Thought Content: No overtly unusual themes elicited  Perception: No clear evidence of hallucinations  Current Suicidal Ideation: Denies  Current Homicidal Ideation: Denies  Concentration: Poor-to-fair  Cognition:  Intact  Judgement: Poor-to-fair  Insight: Limited    Physical Exam:  General: Appears stated age, NAD  Respiratory: Normal effort, No wheezing  Skin: No rashes or wounds  Neurologic: Alert, Awake    Current Medications:  . haloperidol  5 mg Oral Nightly    Or   . haloperidol lactate  2.5 mg Intramuscular Nightly   . multivitamin with minerals-trace elements  1 tablet Oral Daily   . melatonin  3 mg Oral Daily @ 1800           . benztropine  1 mg Oral BID PRN   . eucerin   Topical PRN   . acetaminophen  1,000 mg Oral TID PRN   . ibuprofen  400 mg Oral Q6H PRN   . OLANZapine  5 mg Oral BID PRN   . aluminum & magnesium hydroxide w/simethicone  30 mL Oral Q8H PRN   . magnesium hydroxide  30 mL Oral Daily PRN   . hydrOXYzine HCl  50 mg Oral  TID PRN       Pertinent Labs:    Labs:  Endocrine:       Lab results: 04/29/22  1942   Hemoglobin A1C 5.0     EKG:    04/12/2022   QTc 459        Impression:   Alexis Vang a 31 y.o.femalewith a reported past history of schizophrenia, unspecified mood disorder, PTSD and ADHDwho presented to the hospital voluntarilywith suicidal ideation and homicidal ideationin the context of active substance use, housing instabiltiy and medication/treatment non-adherence.    Today, Alexis Vang remains mildly elevated and highly perseverative on discharge, however, is ultimately amenable to 2nd dose of LAI tomorrow with plan for discharge on Wednesday. She has thus far neither  endorsed nor exhibited any adverse effects from her haloperidol. Given the above, Alexis Vang will benefit from continued admission for further monitoring, stabilization, medication management, and safe discharge planning.     Psychiatric Diagnostic Impression:   Active Problems:    Psychosis, unspecified psychosis type    Plan:   Psychosis, NOSvs. PTSD  -haloperidol decanoate 100 mg LAI (1st dose 05/08/22; 2nd dose on 05/13/22)  - Continue haloperidol 5 mg qhs with court-ordered haloperidol 2.5 mg IM backup (treatment order: [id number]; granted 05/01/2022)  - benztropine 1 mg BID PRN EPS  - olanzapine 5mg  BID PRN for agitation    IM Recommendations: Haldol 5 mg, Ativan 2 mg, Benadryl 50 mg IM  Precautions: Escape Precautions, Violence Precautions and Suicide Precautions  Legal Status: 9.27 (expires 06/06/2022)    Reason(s) for Continued Stay: Unable to care for self in a less structured environment, Stabilization of gains and Need for safe discharge  Discharge Plan:  Pending further psychiatric stabilization; tentatively 05/14/22    Alexis Wafer Ralene Muskrat, DO   Psychiatry - PGY 2  05/12/22 at 7:58 AM

## 2022-05-12 NOTE — Discharge Instructions (Addendum)
Psychiatrist: Dr. Earmon Phoenix  Resident: Dr. Vanessa Ralphs  Social Worker: Doreatha Lew    Discharge Destination: Bonita Quin are being discharged to [facility], located at [address], [place] [address]. [facility] opens at 4:00 PM and their phone number: [phone]. You are also being discharged with 4 bus passes to assist with transportation to your follow up appointments. You have been provided transportation for discharge through [employer] at [employer] PM.     Brief Summary of Your Hospital Course (including key procedures and diagnostic test results):  You were admitted to the inpatient psychiatry unit at [facility] on 04/12/2022 to treat acute symptoms of psychosis and homicidal thoughts. While you were here you met with your treatment team regularly and participated in planning your treatment. You attended group activities and interacted with staff and peers appropriately. Adjustments were made to your medications to target specific symptoms - the details of these changes are included in this paperwork. You were medically stable and discharged from the hospital on 05/14/2022.    - You received a total dose of 200 mg of a haloperidol decanoate long-acting injectable (LAI), with your second injection on 05/13/2022. You should receive your next haloperidol decanoate 200 mg LAI on or around 06/10/2022.  - You will also be discharged with a 30 day supply of benztropine (Cogentin) 1 mg daily as needed for potential side effects related to haloperidol including but not limited to stiffness and restlessness    Information regarding Supportive Referrals and Follow-up care:     You are scheduled an intake appointment with Jola Babinski at [facility], located at [address], [address], [place] [address], on 05/21/22 at 9:00 AM.     You are scheduled an appointment with medication provider at [facility], located at [address]  [address], [place], [place] [address], on 05/21/22 at 10:30 AM.     Recommendations: Diet: Regular - No restrictions  Activity: activity as tolerated  [name] is appropriate to return to all daily activities and therapeutics without restrictions or modifications      The clinical team recommends the following actions for creating a safe environment post discharge: utilize options as identified in completed safety plan , reach out to family and/or friends for support , remove any weapons or firearms from home, remove access to sharp objects, remove illicit substances and/or alcohol from home , take medications as prescribed , remove old medications no longer prescribed , and maintain access to a Narcan kit    You are being discharged with: Medications filled at [facility]    When to call for help if experiencing any troubling symptoms including thoughts to harm yourself or others or with any other questions about your discharge plan or medications please call:  Mother: Ragine Balder, Phone # [phone]  PCP: Marvell Fuller, DO, Phone #: [phone]  Protective Services Name: Deirdre Peer  Protective Services Number: [phone]  Protective Services Agency Name: CPS  Other (Professional) Name: Harriett Rush  Other (Professional) Number: [phone], [phone]  Other (Professional) Agency Name: Public [employer]  Other (Professional) Comments: Research scientist (medical): (24/7) [phone]    Out of [place] Lifeline: 211    Suicide & Crisis Lifeline: 58    [facility] - Crisis Call Line Our Crisis Call Line is available to patients who may be coping with issues related to a mental illness, for example, a  panic or anxiety attack, but who do not feel the need to visit our Psychiatric Emergency Department. The call line is available 24/7. If you are in a crisis and need to speak with a professional, please call [phone].    Level of Outreach Indicated If Patient Fails to Attend Scheduled Mental  Health Appointment: mobile crisis team recommended    Was the patient provided a printed copy of their After Visit Summary:   Yes      Strong Behavioral Health Safety Plan      Step 1: Warning signs (thoughts, images, feelings, behaviors) that a crisis may be developing:  get emotional, can't breath, crying     Step 2: Internal coping strategies - Things I can do to take my mind off my problems without contacting another person (distracting & calming activities):  "Good food"     Step 3: People and social settings that provide distraction:  "My Family"     Step 4: People whom I can ask for help:  n/a     Step 5: Professionals or agencies I can contact during a crisis:              Local Urgent Care or Emergency Room        [facility] CRISIS CALL CENTER: [phone]              [facility] [place] MOBILE CRISIS: [phone]              [employer] [place]: 988              POLICE: 911     Step 6: Making the environment safe (removing or limiting access to lethal means):  staying with family/friend and removing illicit substance and/or alcohol from home     The one thing that is most important to me and worth living for is: my family

## 2022-05-12 NOTE — Progress Notes (Incomplete)
Inpatient Psychiatry  Progress Note   Length of Stay: 28 days    Significant Interval Events: Alexis Vang has been in *** behavioral control and had no acute events overnight. Per nursing, she was noted to be ***. Alexis Vang has been ***adherent with her court-ordered scheduled medication, haloperidol, and did ***utilize acetaminophen, ibuprofen, and Eucerin cream as a PRN medications for pain and dry skin.      Subjective:   Alexis Vang was seen and examined this morning by Clinical research associate. She ***    ROS   Objective:   Vitals:  BP (!) 123/107 (BP Location: Left arm) Comment: Nurse Notified  Pulse 93   Temp 36.4 C (97.5 F) (Infrared)   Resp 16   Ht 1.702 m (5\' 7" )   Wt 65.8 kg (145 lb)   SpO2 96%   BMI 22.71 kg/m   Wt Readings from Last 1 Encounters:   04/12/22 65.8 kg (145 lb)      Mental Status Exam:  Appearance: {Appearance:26261}  Attitude/Behavior: {Behavior:26266}  Motor Activity: {Motor Activity:26268}  Eye Contact: {Eye Contact:26269}  Speech: {Speech:26270}  Mood: "***"  Affect: {Affect:26272}  Thought Process: {Thought Process:26275}  Thought Content: {Thought Content:26276}  Perception: {Perception:26277}  Current Suicidal Ideation: {Suicidal Ideation:26278}  Current Homicidal Ideation: {Homicidal Ideation:26279}  Concentration: {Concentration:26293}  Cognition:  {Cognition:26294}  Judgement: {Insight/Judgment:26295}  Insight: {Insight/Judgment:26295}    Physical Exam:  General: {General:26296}  Respiratory: {Respiratory:26298}  Skin: {Skin:26305}  Neurologic: {Neurologic :26302}  ***  Mental Status Exam:  Appearance: Appears stated age, Disheveled and Dressed in Associate Professor  Attitude/Behavior: Generally cooperative but frequently speaking over team  Motor Activity: No Abnormal Movements  Eye Contact: Direct  Speech: Increased amount and difficult to interrupt at times  Mood: "I want to be discharged today"  Affect: Mildly irritable, somewhat elevated  Thought Process: Goal Directed and Perseverative  Thought  Content: No overtly unusual themes elicited  Perception: No clear evidence of hallucinations  Current Suicidal Ideation: Denies  Current Homicidal Ideation: Denies  Concentration: Poor-to-fair  Cognition:  Intact  Judgement: Poor-to-fair  Insight: Limited    Current Medications:  . [START ON 05/13/2022] haloperidol decanoate  100 mg Intramuscular Q28 Days   . haloperidol  5 mg Oral Nightly    Or   . haloperidol lactate  2.5 mg Intramuscular Nightly   . multivitamin with minerals-trace elements  1 tablet Oral Daily   . melatonin  3 mg Oral Daily @ 1800           . benztropine  1 mg Oral BID PRN   . eucerin   Topical PRN   . acetaminophen  1,000 mg Oral TID PRN   . ibuprofen  400 mg Oral Q6H PRN   . OLANZapine  5 mg Oral BID PRN   . aluminum & magnesium hydroxide w/simethicone  30 mL Oral Q8H PRN   . magnesium hydroxide  30 mL Oral Daily PRN   . hydrOXYzine HCl  50 mg Oral TID PRN       Pertinent Labs:    Labs:  Endocrine:       Lab results: 04/29/22  1942   Hemoglobin A1C 5.0     EKG:    04/12/2022   QTc 459        Impression:   Alexis Vang a 31 y.o.femalewith a reported past history of schizophrenia, unspecified mood disorder, PTSD and ADHDwho presented to the hospital voluntarilywith suicidal ideation and homicidal ideationin the context of active substance use, housing instabiltiy and medication/treatment  non-adherence.    Today, Alexis Vang remains ***.*** Given the above, Alexis Vang will benefit from continued admission for further monitoring, stabilization, medication management, and safe discharge planning.   ***  Today, Alexis Vang remains mildly elevated and highly perseverative on discharge, however, is ultimately amenable to 2nd dose of LAI tomorrow with plan for discharge on Wednesday. She has thus far neither endorsed nor exhibited any adverse effects from her haloperidol. Given the above, Alexis Vang will benefit from continued admission for further monitoring, stabilization, medication management, and safe  discharge planning.     Psychiatric Diagnostic Impression:   Active Problems:    Psychosis, unspecified psychosis type    Plan:   Psychosis, NOSvs. PTSD  -haloperidol decanoate 100 mg LAI (1st dose 05/08/22; 2nd dose on 05/13/22)  - Continue haloperidol 5 mg qhs with court-ordered haloperidol 2.5 mg IM backup (treatment order: [id number]; granted 05/01/2022)  - benztropine 1 mg BID PRN EPS  - olanzapine 5mg  BID PRN for agitation    IM Recommendations: Haldol 5 mg, Ativan 2 mg, Benadryl 50 mg IM  Precautions: Escape Precautions, Violence Precautions and Suicide Precautions  Legal Status: 9.27 (expires 06/06/2022)    Reason(s) for Continued Stay: Unable to care for self in a less structured environment, Stabilization of gains and Need for safe discharge  Discharge Plan:  Pending further psychiatric stabilization; tentatively 05/14/22    Alexis Vang Ralene Muskrat, DO   Psychiatry - PGY 2  05/12/22 at 3:37 PM

## 2022-05-13 ENCOUNTER — Encounter: Payer: Self-pay | Admitting: Psychiatry
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
## 2022-05-13 NOTE — Plan of Care (Addendum)
Brief Plan of Care Note    Discontinued COVID-19/droplet precautions order (inserted by error).  Call placed to Infection Control, who will review chart and approve as appropriate.    Lance Muss, MD  Psychiatry PGY-2  05/13/22 at 2:25 PM

## 2022-05-13 NOTE — Plan of Care (Addendum)
[id number]  Resting in bed with eyes closed. Respirations even and unlabored. No acute safety concerns voiced or gestured.     Problem: Risk for Suicide  Goal: Patient Will Be Safe And Free From Injury Throughout Hospitalization  Description: This goal applies for the duration of hospitalization  Outcome: Progressing towards goal

## 2022-05-13 NOTE — Plan of Care (Signed)
Shift Note: 0800 to 1630    . Psychiatric Presentation (Mood, affect, milieu behaviors): Presenting with euthymic mood and appropriate affect.  Speech is regular.  Denies SI/HI/AVH/VI/EI.  Milieu activity is somewhat disorganized and inappropriate, intrusive in the common area, yelling, able to make needs known, and in decent behavioral control. Pt. Needing some reminders to adhere to unit expectations such as taking off shirt in the hallway to show writer her arm where she will get the injection.    . Safety Concerns: no safety concerns.    . Pain: denies pain    . Pt is performing ADLs: Independently and Without prompting;     . Pt-reported quality of sleep: Restful    . Internal stimuli: Patient denies    . Observed response to scheduled medications: tolerating well. No adverse side effects noted.      Mental Status Exam  Appearance: Appropriately dressed  Relationship to Interviewer: Cooperative, Intrusive, Engages well  Psychomotor Activity: Normal  Abnormal Movements: None  Muscle Strength and Tone: Normal  Station/Gait : Normal  Speech : Regular rate, Normal tone, Normal rhythm, Normal amount  Language: Fluent, Normal comprehension  Mood: Euthymic  Affect: Euthymic, Appropriate, Labile  Thought Process: Goal-directed, Sequential  Thought Content: No suicidal ideation, No homicidal ideation  Perceptions/Associations : No hallucinations  Sensorium: Alert  Cognition: Poor attention span  Fund of Knowledge: Normal  Insight : Fair  Judgement: Poor      Problem: Risk for Suicide  Goal: Patient Will Be Safe And Free From Injury Throughout Hospitalization  Description: This goal applies for the duration of hospitalization  Outcome: Maintaining
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
## 2022-05-13 NOTE — Plan of Care (Signed)
Shift Note: 1600 to 0030    . Psychiatric Presentation (Mood, affect, milieu behaviors): Pt has been present in the milieu this shift. She has been attending groups and socializing appropriately with peers. Pt denies SI/HI/AVH. She has been able to make her needs known appropriately. Pt remains calm, cooperative with staff, in good behavioral control.     . Safety Concerns: Pt denies SI/HI. Pt has not engaged in any violent or self injurious behaviors.    . Pain: Pt denies c/o pain or discomfort.    . Pt is performing ADLs: Independently;     . Internal stimuli: Patient denies    . Observed response to scheduled medications: All scheduled medications tolerated well, no adverse effects noted.      Last filed CCSR risk value (Date/Time when filed):    [place] Suicide Severity Rating Scale Risk Calculation (Frequent Screener): Low Risk (05/13/22 2100)

## 2022-05-13 NOTE — Continuity of Care (Incomplete)
[facility] Trans[facility]ive [facility] Referral    Name: [name] Tesler  Address:  [facility] [place] [address]  County: [place]  Date of Birth: 1991/11/19  Age: 31 y.o.  Veteran: No    Medication Information:  . multivitamin with minerals-trace elements  1 tablet Oral Daily   . melatonin  3 mg Oral Daily @ 1800       Medication Management:       Psychiatric Diagnosis: <principal problem not specified>    Allergies:   Allergies   Allergen Reactions   . Food Nausea And Vomiting     Collard greens       Expected Discharge Date:  05/14/22    Reason for Referral:  Client will be looking for permanent housing.     Housing Plan after [facility]:  Needs to be connected with care management.     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

## 2022-05-13 NOTE — Plan of Care (Signed)
Shift Note: 2000 to 0030    Psychiatric Presentation (Mood, affect, milieu behaviors): Pt is received in her room where she is resting with eyes closed. No signs of distress discomfort or anxiety noted by non verbal ques, Even, quiet, unlabored breathing noted. Will continue to monitor for safety and security    Safety Concerns: escape    Pain: 0/10    Pt is performing ADLs: Independently;   If not independent, comment:     Pt-reported quality of sleep: Restful    Internal stimuli: Patient denies    Observed response to scheduled medications: no negative side effects    If uncooperative with scheduled medications:  # times offered:   Strategies used for encouraging med adherence and effectiveness:         Last filed CCSR risk value (Date/Time when filed):    [place] Suicide Severity Rating Scale Risk Calculation (Frequent Screener): Low Risk (05/13/22 2100)    Problem: Risk for Suicide  Goal: Patient Will Be Safe And Free From Injury Throughout Hospitalization  Description: This goal applies for the duration of hospitalization  Outcome: Maintaining

## 2022-05-14 ENCOUNTER — Other Ambulatory Visit: Payer: Self-pay

## 2022-05-14 MED ORDER — BENZTROPINE MESYLATE 1 MG PO TABS *I*
1.0000 mg | ORAL_TABLET | Freq: Every day | ORAL | 0 refills | Status: DC | PRN
Start: 2022-05-14 — End: 2022-06-13
  Filled 2022-05-14: qty 30, 30d supply, fill #0
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
## 2022-05-14 NOTE — Plan of Care (Signed)
Pt. Was dressed appropriately for the weather and was discharged with bus passes at 1118. All belongings were returned and discharge paperwork was reviewed. All patient questions and concerns were addressed. Patient received home medications for discharge from the outpatient pharmacy and medications were verified to be correct by the treatment team. Understanding of medications and follow up appointments were confirmed using the teach back method. No safety concerns at this time.    Problem: Risk for Suicide  Goal: Patient Will Be Safe And Free From Injury Throughout Hospitalization  Description: This goal applies for the duration of hospitalization  Outcome: Maintaining
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
## 2022-05-14 NOTE — Progress Notes (Addendum)
Psych Social Work Progress Note    Chart reviewed and Discussed patient with treatment team    Contact with: Warden/ranger type: [facility] secure E-Mail    Purpose of Contact: Referrals    Narrative: Clinical research associate used Animal nutritionist to [employer] and [employer] on 05/13/22 to inquire about any open beds. Writer was informed that the [facility] while pt is not on the "not suitable" list, there are no available [facility] beds at this time. Writer submitted Transitional Housing referral on 05/13/22 in regards to availability to [facility] but no response yet. Writer sent another email on 05/14/22 to follow up and awaiting response.     Writer obtained intake appointment with [facility] for 05/21/22 at 9AM.     Update: [facility] reached out to Clinical research associate over [employer] and reported that their is no availability at this time. Patient to be discharged to [facility].     Writer received phone call from Harriett Rush (pt's CPS SWer) and provided her with update. She reported that she will contact [name] on the unit before leaving at 2:30. Writer provided her with pt's mother phone number     Next Steps: Plan for estimated discharge today at 2:30 via [employer]     Doreatha Lew, MSW

## 2022-05-20 NOTE — Telephone Encounter (Addendum)
No MCM account, no address, no numbers. Routing to [employer] nurse and signing encounter.       Created a letter then realized no address.

## 2022-05-21 ENCOUNTER — Ambulatory Visit: Payer: Self-pay

## 2022-05-22 ENCOUNTER — Ambulatory Visit: Payer: Self-pay

## 2022-05-22 DIAGNOSIS — F29 Unspecified psychosis not due to a substance or known physiological condition: Secondary | ICD-10-CM

## 2022-05-22 NOTE — Progress Notes (Signed)
Clinical Management Note     [name] did not show for her [facility] Intake yesterday. Upon consultation with supervisor, sent MCT to the [facility] this morning as hospital discharge recommendations indicated.

## 2022-05-22 NOTE — Progress Notes (Signed)
Submitted At 05/22/2022 9:55:23 AM   Patient Information   First Name Alexis Vang   Last Name Alexis Vang MRN [id number]   Date of Birth Jul 22, 1991   Gender Female   Parent Name (required if the patient is under the age of 72)    Address [facility]   [place]    [place]IP [address]   Primary Phone [phone]   Secondary Phone    Referring Clinic [facility]   Reason for Mobile Crisis Referral COPS intake yesterday - no showed. No ability to contact her. Hospital Discharge Note (excerpts): Alexis Vang [name] is a 31 y.o. female with a past history reportedly of schizophrenia, unspecified mood disorder, PTSD, ADHD, and substance use disorder (cocaine)  who presented to the hospital voluntarily  with suicidal ideation and homicidal ideation in the context of active substance use, housing instabiltiy and medication/treatment nonadherence.    In CPEP, she endorsed that she had homicidal ideation against CPS workers, because that is where "her baby came from".  She also endorsed paranoid thoughts about being infected with diseases, being poked with needles, and witchcraft/spells.  Patient also appeared to be very disorganized, tangential, and appearing to respond to internal stimuli, and was unable to fully participate in the interview.  Patient was then admitted under 9.39 status. While in the hospital, Alexis Vang was generally in fair behavioral control and did not need IM medications, restraints, or seclusions for acute safety concerns.  No acute medical issues were addressed during the admission.     Alexis Vang has been deemed medically and psychiatrically stable for discharge today, 05/14/22. She currently denies acute suicidal ideation and homicidal ideation. Alexis Vang does not appear to be overtly psychotic, manic, depressed, intoxicated or withdrawing from substances at this time. She was agreeable to adhering to the recommended post-hospitalization treatment plan including medication compliance and  prompt followup.    Discharge plan recommended MCT to follow up if she missed her intake per Doreatha Lew MSW   Interpreter Services Needed? No   Which Language?    Safety Concerns? None   Other Concern    Requester Information   What is your full name? Eston Mould LCAT   Referrer wishes to remain anonymous    Requester Phone [phone]   Ext. Number    What is your relationship to the patient? Assigned Intake therapist for yesterday   Requester Email (needed to receive a copy of this referral) [email or web address]

## 2022-05-22 NOTE — Progress Notes (Signed)
Attempted cold call, pt is not a resident at [facility] at this time. No forwarding number - phone on referral is for [facility]. Case will be closed.

## 2022-05-22 NOTE — Progress Notes (Signed)
MCT will complete unannounced visit due to concerns expressed in referral / phone number on referral is to [facility].

## 2022-05-23 ENCOUNTER — Telehealth: Payer: Self-pay

## 2022-05-23 NOTE — Telephone Encounter (Signed)
Writer attempted to call pt to reschedule an intake. Pt's phone was off and VM was not set up.

## 2022-05-23 NOTE — Telephone Encounter (Signed)
Patient called to reschedule NPV, Patient may be contacted at [phone]. Please advise.

## 2022-05-28 NOTE — Telephone Encounter (Signed)
Writer attempted to contact patient. Unable to leave a vm as patient has a VMB that is not set up. Writer wil re-attempt later.

## 2022-05-30 ENCOUNTER — Emergency Department: Payer: Self-pay

## 2022-05-30 ENCOUNTER — Other Ambulatory Visit: Payer: Self-pay

## 2022-05-30 ENCOUNTER — Encounter: Payer: Self-pay | Admitting: Emergency Medicine

## 2022-05-30 ENCOUNTER — Emergency Department
Admission: EM | Admit: 2022-05-30 | Discharge: 2022-05-30 | Disposition: A | Discharge status: Home / Self Care | Payer: Self-pay | Source: Ambulatory Visit | Attending: Emergency Medicine | Admitting: Emergency Medicine

## 2022-05-30 ENCOUNTER — Encounter: Payer: Self-pay | Admitting: Gastroenterology

## 2022-05-30 DIAGNOSIS — Y998 Other external cause status: Secondary | ICD-10-CM | POA: Insufficient documentation

## 2022-05-30 DIAGNOSIS — M25561 Pain in right knee: Secondary | ICD-10-CM | POA: Insufficient documentation

## 2022-05-30 DIAGNOSIS — Y9389 Activity, other specified: Secondary | ICD-10-CM | POA: Insufficient documentation

## 2022-05-30 DIAGNOSIS — S199XXA Unspecified injury of neck, initial encounter: Secondary | ICD-10-CM

## 2022-05-30 DIAGNOSIS — Y92410 Unspecified street and highway as the place of occurrence of the external cause: Secondary | ICD-10-CM | POA: Insufficient documentation

## 2022-05-30 DIAGNOSIS — S3991XA Unspecified injury of abdomen, initial encounter: Secondary | ICD-10-CM

## 2022-05-30 DIAGNOSIS — M545 Low back pain, unspecified: Secondary | ICD-10-CM | POA: Insufficient documentation

## 2022-05-30 DIAGNOSIS — S3992XA Unspecified injury of lower back, initial encounter: Secondary | ICD-10-CM

## 2022-05-30 DIAGNOSIS — M79641 Pain in right hand: Secondary | ICD-10-CM | POA: Insufficient documentation

## 2022-05-30 DIAGNOSIS — M79642 Pain in left hand: Secondary | ICD-10-CM | POA: Insufficient documentation

## 2022-05-30 DIAGNOSIS — S60511A Abrasion of right hand, initial encounter: Secondary | ICD-10-CM | POA: Insufficient documentation

## 2022-05-30 DIAGNOSIS — S60512A Abrasion of left hand, initial encounter: Secondary | ICD-10-CM | POA: Insufficient documentation

## 2022-05-30 DIAGNOSIS — M79671 Pain in right foot: Secondary | ICD-10-CM | POA: Insufficient documentation

## 2022-05-30 DIAGNOSIS — S299XXA Unspecified injury of thorax, initial encounter: Secondary | ICD-10-CM

## 2022-05-30 DIAGNOSIS — M546 Pain in thoracic spine: Secondary | ICD-10-CM | POA: Insufficient documentation

## 2022-05-30 DIAGNOSIS — M25562 Pain in left knee: Secondary | ICD-10-CM | POA: Insufficient documentation

## 2022-05-30 DIAGNOSIS — M79672 Pain in left foot: Secondary | ICD-10-CM | POA: Insufficient documentation

## 2022-05-30 DIAGNOSIS — S0990XA Unspecified injury of head, initial encounter: Secondary | ICD-10-CM

## 2022-05-30 DIAGNOSIS — S3993XA Unspecified injury of pelvis, initial encounter: Secondary | ICD-10-CM

## 2022-05-30 LAB — CBC AND DIFFERENTIAL
Baso # K/uL: 0 10*3/uL (ref 0.0–0.1)
Basophil %: 0.5 %
Eos # K/uL: 0.2 10*3/uL (ref 0.0–0.4)
Eosinophil %: 2.9 %
Hematocrit: 38 % (ref 34–45)
Hemoglobin: 11.9 g/dL (ref 11.2–15.7)
IMM Granulocytes #: 0 10*3/uL (ref 0.0–0.0)
IMM Granulocytes: 0.2 %
Lymph # K/uL: 1.8 10*3/uL (ref 1.2–3.7)
Lymphocyte %: 30.8 %
MCH: 28 pg (ref 26–32)
MCHC: 31 g/dL — ABNORMAL LOW (ref 32–36)
MCV: 91 fL (ref 79–95)
Mono # K/uL: 0.5 10*3/uL (ref 0.2–0.9)
Monocyte %: 9.3 %
Neut # K/uL: 3.3 10*3/uL (ref 1.6–6.1)
Nucl RBC # K/uL: 0 10*3/uL (ref 0.0–0.0)
Nucl RBC %: 0 /100 WBC (ref 0.0–0.2)
Platelets: 316 10*3/uL (ref 160–370)
RBC: 4.2 MIL/uL (ref 3.9–5.2)
RDW: 13.9 % (ref 11.7–14.4)
Seg Neut %: 56.3 %
WBC: 5.8 10*3/uL (ref 4.0–10.0)

## 2022-05-30 LAB — RUQ PANEL (ED ONLY)
ALT: 12 U/L (ref 0–35)
AST: 15 U/L (ref 0–35)
Albumin: 4.4 g/dL (ref 3.5–5.2)
Alk Phos: 73 U/L (ref 35–105)
Amylase: 109 U/L — ABNORMAL HIGH (ref 28–100)
Bili,Indirect: 0.4 mg/dL (ref 0.1–1.0)
Bilirubin,Direct: 0.2 mg/dL (ref 0.0–0.3)
Bilirubin,Total: 0.6 mg/dL (ref 0.0–1.2)
Lipase: 39 U/L (ref 13–60)
Total Protein: 7.4 g/dL (ref 6.3–7.7)

## 2022-05-30 LAB — PLASMA PROF 7 (ED ONLY)
Anion Gap,PL: 13 (ref 7–16)
CO2,Plasma: 22 mmol/L (ref 20–28)
Chloride,Plasma: 102 mmol/L (ref 96–108)
Creatinine: 0.74 mg/dL (ref 0.51–0.95)
Glucose,Plasma: 71 mg/dL (ref 60–99)
Potassium,Plasma: 3.7 mmol/L (ref 3.3–4.6)
Sodium,Plasma: 137 mmol/L (ref 133–145)
UN,Plasma: 15 mg/dL (ref 6–20)
eGFR BY CREAT: 111 *

## 2022-05-30 LAB — HOLD BLUE

## 2022-05-30 LAB — BASIC METABOLIC PANEL
Anion Gap: 14 (ref 7–16)
CO2: 21 mmol/L (ref 20–28)
Calcium: 9.1 mg/dL (ref 8.8–10.2)
Chloride: 107 mmol/L (ref 96–108)
Creatinine: 0.74 mg/dL (ref 0.51–0.95)
Glucose: 80 mg/dL (ref 60–99)
Lab: 15 mg/dL (ref 6–20)
Potassium: 4.2 mmol/L (ref 3.3–5.1)
Sodium: 142 mmol/L (ref 133–145)
eGFR BY CREAT: 111 *

## 2022-05-30 LAB — BLOOD BANK HOLD PINK

## 2022-05-30 LAB — BHCG, QUANT PREGNANCY: BHCG, QUANT PREGNANCY: <1 m[IU]/mL (ref 0–1)

## 2022-05-30 LAB — HOLD LAVENDER

## 2022-05-30 LAB — HOLD GREEN NO GEL

## 2022-05-30 LAB — HOLD SST

## 2022-05-30 MED ORDER — IBUPROFEN 600 MG PO TABS *I*
600.0000 mg | ORAL_TABLET | Freq: Three times a day (TID) | ORAL | 0 refills | Status: AC | PRN
Start: 2022-05-30 — End: ?
  Filled 2022-05-30: qty 30, 10d supply, fill #0

## 2022-05-30 MED ORDER — IOHEXOL 350 MG/ML (OMNIPAQUE) IV SOLN 500ML BOTTLE *I*
91.0000 mL | Freq: Once | INTRAVENOUS | Status: AC
Start: 2022-05-30 — End: 2022-05-30
  Administered 2022-05-30: 91 mL via INTRAVENOUS

## 2022-05-30 MED ORDER — IOHEXOL 350 MG/ML (OMNIPAQUE) IV SOLN 500ML BOTTLE *I*
20.0000 mL | Freq: Once | INTRAVENOUS | Status: AC
Start: 2022-05-30 — End: 2022-05-30
  Administered 2022-05-30: 20 mL via INTRAVENOUS

## 2022-05-30 MED ORDER — ACETAMINOPHEN 500 MG PO TABS *I*
1000.0000 mg | ORAL_TABLET | Freq: Four times a day (QID) | ORAL | 0 refills | Status: AC | PRN
Start: 2022-05-30 — End: 2022-06-29
  Filled 2022-05-30: qty 30, 4d supply, fill #0

## 2022-05-30 MED ORDER — HYDROMORPHONE HCL PF 1 MG/ML IJ SOLN *WRAPPED*
0.5000 mg | Freq: Once | INTRAMUSCULAR | Status: AC
Start: 2022-05-30 — End: 2022-05-30
  Administered 2022-05-30: 0.5 mg via INTRAVENOUS
  Filled 2022-05-30: qty 0.5

## 2022-05-30 NOTE — ED Notes (Signed)
Pt refused bed pan, ambulatory to restroom with c collar in place.
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
## 2022-05-30 NOTE — ED Notes (Signed)
Pt back from imaging, family at bedside, pt sitting up eating food brought by family.
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
## 2022-05-30 NOTE — ED Notes (Signed)
Patients wounds cleaned and dressed with bacitracin.
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
## 2022-05-30 NOTE — ED Notes (Signed)
Assumed care of pt, at imaging at this time.
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
## 2022-06-02 NOTE — Telephone Encounter (Signed)
Writer called and spoke with patient. Patient stated it was not a good time as she was at an appt and was doing something. Writer will wait for patient to return call for scheduling.

## 2022-06-03 ENCOUNTER — Encounter: Payer: Self-pay | Admitting: Gastroenterology

## 2022-06-03 ENCOUNTER — Ambulatory Visit: Payer: Medicaid Other | Attending: Psychiatry

## 2022-06-03 ENCOUNTER — Other Ambulatory Visit: Payer: Self-pay

## 2022-06-03 ENCOUNTER — Ambulatory Visit: Payer: Medicaid Other

## 2022-06-03 DIAGNOSIS — F29 Unspecified psychosis not due to a substance or known physiological condition: Secondary | ICD-10-CM | POA: Insufficient documentation

## 2022-06-03 DIAGNOSIS — F1910 Other psychoactive substance abuse, uncomplicated: Secondary | ICD-10-CM | POA: Insufficient documentation

## 2022-06-03 DIAGNOSIS — F4310 Post-traumatic stress disorder, unspecified: Secondary | ICD-10-CM | POA: Insufficient documentation
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
## 2022-06-04 ENCOUNTER — Telehealth: Payer: Self-pay

## 2022-06-04 ENCOUNTER — Ambulatory Visit: Payer: Self-pay

## 2022-06-04 NOTE — Telephone Encounter (Signed)
Pt canceled 6/7; 12:45 appointment with Charde Tillison,  No reason, rescheduled for 10/12 for 10:15

## 2022-06-05 NOTE — Progress Notes (Signed)
[facility] Referral Note     Clinician and program currently engaged in: Intake process with Larwance Rote, LCSW-[facility]  Referral discussed between clinician and embedded CD: No: will route intake note    Reason for referral: Client has a history of polysubstance use and continues to use alcohol and tobacco products regularly.      Discussion completed with patient regarding referral: Yes    DIAGNOSIS:  Diagnoses   Code Name Primary?   . F29 Psychosis, unspecified psychosis type Yes   . F43.10 Post traumatic stress disorder (PTSD)    . F19.10 Polysubstance abuse              OTHER ACTIVE PARTICIPANTS IN PATIENT'S CARE AND SUPPORT SYSTEM (other than mental health providers): Care Manager: Brien Mates SW    Is there a release of information present for any of the above providers? Yes,

## 2022-06-05 NOTE — Progress Notes (Signed)
[facility] Referral Note     Clinician and program currently engaged in: Intake process with Larwance Rote, LCSW, [facility]  Referral discussed between clinician and Targeted Case Management: Yes    Reason for referral: Short term SW/Case management through legal services. Client has concerns wit chronic homelessness.     Discussion completed with patient regarding referral: Yes    DIAGNOSIS:  Diagnoses   Code Name Primary?   . F29 Psychosis, unspecified psychosis type Yes   . F43.10 Post traumatic stress disorder (PTSD)    . F19.10 Polysubstance abuse              OTHER ACTIVE PARTICIPANTS IN PATIENT'S CARE AND SUPPORT SYSTEM (other than mental health providers): CPS/Legal team    Is there a release of information present for any of the above providers? Yes, Harriett Rush through legal services

## 2022-06-05 NOTE — Telephone Encounter (Signed)
Writer called and spoke with patient. Patient scheduled.

## 2022-06-09 ENCOUNTER — Ambulatory Visit: Payer: Self-pay

## 2022-06-09 ENCOUNTER — Other Ambulatory Visit: Payer: Self-pay

## 2022-06-09 ENCOUNTER — Telehealth: Payer: Self-pay

## 2022-06-09 ENCOUNTER — Ambulatory Visit: Payer: Medicaid Other

## 2022-06-09 ENCOUNTER — Ambulatory Visit: Payer: Self-pay | Admitting: Psychiatry

## 2022-06-09 NOTE — Progress Notes (Signed)
Writer spoke with patient in regards to her 10:15a intake appointment. Patient states that she will be catching the bus from [address] to the [facility] transit center. Writer informed patient that she would need to transfer at the transit center to the 14 bus to [address]. Patient was advised that the bus leaves [address] at 9:15a and arrive at [address] at approx. 9:38a. Patient also inquired about the zoom link for her appointment in which writer referred her to calling into the office for further instruction on how to access the appointment due to her MyChart account not being setup.

## 2022-06-09 NOTE — Telephone Encounter (Signed)
The writer was informed by TCM, Alexis Vang that she was able to speak with Alexis Vang and Alexis Vang plans to attend her afternoon appointments today. The writer reached out to Alexis Vang to coordinate her second intake appointment prior to her medication provider appointment, as Alexis Vang is not currently enrolled. Alexis Vang was in agreement to meeting with writer today at 1:30pm. She indicated belief that her [facility] appointments were for tomorrow as to the reason why she missed her appointments this morning. Alexis Vang is aware her LAI is due tomorrow and she will need to attend appointments today in order to remain medication compliant.     The Clinical research associate attempted outreach to Alexis Vang's legal SW, Alexis Vang. Alexis Vang was not available and Clinical research associate left a VM regarding missed and re/sceduled appointments for today.
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
## 2022-06-09 NOTE — Care Coordination Plan (Signed)
Clinical Management Note     Client's care discussed at team meeting today due to missed intake appointments and LAI coming due. Client is planning to come in for afternoon appointments. If client shows there will be treatment contingencies included in her treatment plan regarding appointment attendance to remain enrolled in care.

## 2022-06-09 NOTE — Care Coordination Plan (Signed)
Clinical Management Note     The client no showed her appointments this afternoon after rescheduling them from this morning. Writer contacted client and the client stated she got lost on the bus route. She states she needs help with transportation and Clinical research associate agreed to follow up with TCM for support with this. The writer also encouraged client to speak with her legal SW, as it was writer's understanding she was going to help get client to appointments. The writer reminded client her LAI is due tomorrow, though she will be late in receiving this due to the amount of missed appointments and provider availability. Client does not feel she needs the LAI though became worried about compliance for her CPS case. Writer noted that unfortunately we cannot accommodate her in receiving her LAI tomorrow due to provider availability, but that writer would speak with her medication provider regarding availability. Writer noted the client cannot receive her LAI without seeing a medication provider first. The writer was able to get client rescheduled for an enrollment visit tomorrow morning and agreed to have TCM assist with directions for the bus schedule. TCM was able to confirm the client does not have an active 2015 form in her [employer] account for cab eligibility at this time. The writer also agreed to assist client in getting rescheduled with TCM for tomorrow by zoom. Client is worried about downloading the incorrect mychart application for the zoom appointment and writer agreed to assist her with this during the appointment tomorrow. Writer attempted outreach to client's legal SW with new appointments, though she was not available at the time of writer's call.

## 2022-06-10 ENCOUNTER — Ambulatory Visit: Payer: Medicaid Other

## 2022-06-10 ENCOUNTER — Telehealth: Payer: Self-pay

## 2022-06-10 ENCOUNTER — Other Ambulatory Visit: Payer: Self-pay

## 2022-06-10 DIAGNOSIS — F1910 Other psychoactive substance abuse, uncomplicated: Secondary | ICD-10-CM

## 2022-06-10 DIAGNOSIS — F4310 Post-traumatic stress disorder, unspecified: Secondary | ICD-10-CM

## 2022-06-10 DIAGNOSIS — F29 Unspecified psychosis not due to a substance or known physiological condition: Secondary | ICD-10-CM

## 2022-06-10 NOTE — Progress Notes (Signed)
[facility] Primary Care Screen - Adult     Duration:  15 minutes      06/10/2022      Patient Name:  Alexis Vang  Patient Date of BIrth:  [birth year]  Patient Medical Record Number:  [id number]  Patient Phone (Home):  [phone] (home)   Patient Mobile Phone:    Telephone Information:   Mobile [phone]     Patient Address:  [facility] [place] [address]      Health Screen:    Visit Vitals  Ht 1.676 m (5\' 6" )   Wt 68 kg (150 lb)   LMP 06/10/2022   BMI 24.21 kg/m       Name of Primary Care Provider:  Marvell Fuller, DO  Date of Last Physical Exam:  (unknown)  Physical exam is within last 12 months?: Yes (unknown)  Patient is actively engaged with PCP of record?: Yes    For Females Only:  Name of OB/GYN: [facility]  Patient is actively engaged with OB/GYN of record?: Yes  Are you postmenopausal?: No  Last Menstrual Period Date: 06/10/22  Menstrual irregularities:  (UTA)  Currently pregnant? : No        Currently breastfeeding: No    Past Medical History:   Diagnosis Date   . Anxiety    . Asthma    . Depression    . Gestational diabetes    . Preeclampsia    . Scabies    . Substance abuse        Past Surgical History:   Procedure Laterality Date   . CESAREAN SECTION, LOW TRANSVERSE     . Left leg surgery     . TONSILLECTOMY AND ADENOIDECTOMY         Medications/Vitamins/Supplements         Last Dose Start Date End Date Provider     acetaminophen (TYLENOL) 500 mg tablet As Needed  05/30/22  06/29/22  Joylene Draft, MD     Take 2 tablets (1,000 mg total) by mouth every 6 hours as needed     benztropine (COGENTIN) 1 mg tablet As Needed  05/14/22  06/13/22  Lyndal Pulley, DO     Take 1 tablet (1 mg total) by mouth daily as needed (EPS)  for Extrapyramidal Reaction caused by Medications     Patient not taking: Reported on 06/10/2022     haloperidol decanoate (HALDOL DECANOATE) 100 MG/ML injection Taking  --  --  [provider]     Inject 2 mLs (200 mg total) into the muscle every 28 days     ibuprofen  (ADVIL,MOTRIN) 600 mg tablet As Needed  05/30/22  --  Joylene Draft, MD     Take 1 tablet (600 mg total) by mouth 3 times daily as needed for Pain          Will you run out of your psychiatric medications within the next two weeks?: No        Are you on a long acting injectable medication?: Yes  If yes, what medication and when is it due next?: Haldol 200 mg due now (getting Friday 6/16)    Allergy History as of 06/10/22     SHELLFISH-DERIVED PRODUCTS       Noted Status Severity Type Reaction    05/06/22 0922 Wilford Grist, RN 04/15/22 Deleted Medium Allergy Swelling    04/15/22 1327 Lenice Pressman 04/15/22 Active Medium Allergy Swelling    03/21/22 1603 Arlan Organ, RN 03/21/22 Active  Allergy Swelling  FISH-DERIVED PRODUCTS       Noted Status Severity Type Reaction    05/06/22 1610 Wilford Grist, RN 04/15/22 Deleted Medium Allergy Swelling    04/15/22 1327 Lenice Pressman 04/15/22 Active Medium Allergy Swelling          FOOD       Noted Status Severity Type Reaction    04/15/22 1328 Gorenc, Natalia Leatherwood 04/15/22 Active Low Intolerance Nausea And Vomiting    Comments: Collard greens           EGGS OR EGG-DERIVED PRODUCTS       Noted Status Severity Type Reaction    06/10/22 1055 Corrinne Eagle, RN 06/10/22 Active  Allergy Hives                Nutrition:    Do you have any food allergies?: Yes  If yes, what is the allergy and the severity of the reaction?: eggs & honey- hives/itching  Have you experienced an increase or decrease in food intake and/or appetite in the last three months?:  (UTA)     Have you had a weight loss or gain of 10 pounds or more in the past three months?: Yes  Loss or gain?: Gain  Do you have eating habits or behaviors that may be indicators of an eating disorder, such as bingeing or inducing vomiting?:  (UTA)  Do you feel you need to speak with someone regarding your eating patterns?:  (UTA)  Do you have any dental problems impacting your ability to eat?:   (UTA)    Nutritional Concerns: Patient has a current concern with eating/weight change requiring an appointment with a medical provider, provided education and instructed to call PCP to schedule an appointment.    Estimated body mass index is 24.21 kg/m as calculated from the following:    Height as of this encounter: 1.676 m (5\' 6" ).    Weight as of this encounter: 68 kg (150 lb).  Which statement describes the patient with regard to BMI?  BMI within normal limits.    Which, if any, does the patient say describes her?  ###Patient referred to PCP      Social History     Tobacco Use   Smoking Status Every Day   . Packs/day: 0.50   . Types: Cigarettes   . Start date: 2002   Smokeless Tobacco Former   . Types: Snuff, Chew   Vaping Use   Vaping Status Not on file       Does the patient use tobacco products?  Yes,  Patient declines intervention at this time    Pain Assessment:    Do you have any ongoing pain problems?:  (UTA)                                                                   Fall Risk Screen:    Have you fallen in the last year?:  (UTA)        Do you feel you are at risk of falling?:  (UTA)          Infectious Disease Screen:    Have you ever been tested for Hepatitis?: Yes  Did you test positive for Hepatitis?: No  Have you ever tested positive for tuberculosis?: No  Have you been tested for HIV?: Yes     Were the results: : Negative    After review of documentation and assessment during session, patient appears to be free of serious communicable disease that can be transmitted through ordinary contact with others.                              Patient indicated that they understood the above information and was advised that regardless of the above findings, they should communicate their current condition with their PCP: yes    Recommended Health Services:  - Based on the above Primary Care Screening, patient is in agreement with the recommendations for further evaluation.  - Patient has endorsed  signs and/or symptoms of potential nutritional concerns.  Recommend patient follow-up with PCP for further evaluation.  -Unknown if patient has had physical exam within last 12 months due to patient ending screen early. Will recommend patient follow up with PCP when she meets with new provider.

## 2022-06-10 NOTE — BH Intake Assessment (Addendum)
[facility] Adult Enrollment Note     Length of Session: 60 minutes.    Contact Type:  Individual Psychotherapy    Location: [facility]    Updated Psychosocial History (in combination with Initial Diagnostic Assessment)  Housing/Living Situation: no changes  Employment: no changes  Support System: no changes  Family/Relationship Changes: no changes  Psychiatric History:  Past Outpatient Treatment: None Reported.  Prior Psychiatric Hospitalizations: Only known psychiatric hospitalizations have been at [facility] within the past year. Client does not acknowledge the hospitalization resulting in the [facility] referral as a psychiatric hospitalization becasue she does not believe she has a mental illness.   Prior History of Taking Psychotropic Medications: Yes.  Specify (Include details relevant to what has been or has not been helpful with this treatment): Client denies taking psychiatric medications prior to [facility].   Substance Use/Addictive Behavior Screen:  Does individual report problems (historical or current) with any of the following?   Prescription Drugs, Alcohol, Tobacco, Other: Cannabis   Cigarettes: 7-9 per day  Alcohol and Percocet: "Using as much as I can, makes me feel better and more relaxed, drank a little last night." Client states she had "a beer and 2 shots" last night. She reports typically consuming "maybe a pint or 16oz of beer about every 7 hours."  Client denies this as a concern because "it's legal." She does endorse some withdrawal symptoms between use in terms of "shakes."  Client states she has been having headaches, which could be a withdrawal symptom as well. Writer asked about any OTC medications to help with headaches and client states she "popped a Percocet" for her headache yesterday.  She indicates this is not her prescription and that a friend got her one. Client states she would like the medication provider to give her migraine medications. Writer indicated client is  advised to only take medication that is prescribed to her.   Marijuana: daily use. Client states two days out of the week she will smoke consistently throughout the day.  She asked if her medication provider at [facility] could give her a medical marijuana card. Writer deferred conversation for her medication provider. Of note, client walked into writer's office with an unrolled blunt. When asked to put it away, client took her time to clean and roll her blunt, indicating it was legal so writer should not have an issue.  Writer clarified that regardless of marijuana being legal she should not have it out in her appointments and as a part of her treatment agreement she cannot come to appointments under the influence of any substance. The writer indicated concern for client's substance use due to their mood altering effects, particularly if the client indicates symptoms of depression. The client does not feel as though this information applies to her and states her use has only been helpful for her mood. Client could not understand writer's concerns for long term effects and mood changes she experiences when sober.   Prior Treatment History for Substance Use/Abuse: None Reported., Client states she was sober when at [facility], though this was not a formal substance use treatment program. .   Prior History of Taking Medications to Treat Addiction: None Reported., Client indicates trying Nicotine patches and gum without success in the past.    The writer was able to obtain a ROI for [facility], as client does not know if she is enrolled in their program. Writer will plan to follow up with [facility] to  confirm client's status for care there.     Medical History:  Past Medical History:   Diagnosis Date   . Anxiety    . Asthma    . Depression    . Gestational diabetes    . Preeclampsia    . Scabies    . Substance abuse        Screening Instruments:    Rehabilitation Readiness:  Interventions/recommendations:  Client would like to go back to school and get a job.     Pain:  Patient pain score is: Headache: 7 due to crying.   No further intervention is indicated at this time.    Learning Needs:  Patient and/or assessment process has identified the following  learning needs relevant to treatment: client feels she has trouble understanding things. Writer has noticed issues with memory.     Spiritual Issues:  Patient and/or assessment process has not identified spiritual issues at this time. I am a Christian.     Cultural Issues:  Patient and/or assessment process has not identified cultural issues at this time    Sexual/Gender Identity Issues:  Patient and/or assessment process has not identified sexual/gender identity issues at this time    Mental Status Exam  APPEARANCE: Appears stated age, Casual2  ATTITUDE TOWARD INTERVIEWER: Entitled, Guarded and Superficial  MOTOR ACTIVITY: WNL (within normal limits)  EYE CONTACT: Direct  SPEECH: Normal rate and tone  AFFECT: Flat, Anxious and Apathetic  MOOD: Depressed  THOUGHT PROCESS: Concrete  THOUGHT CONTENT: Preoccupations-son and substance use  PERCEPTION: No evidence of hallucinations  CURRENT SUICIDAL IDEATION: patient denies  CURRENT HOMICIDAL IDEATION: Patient denies  ORIENTATION: To Person and To Place  CONCENTRATION: Fair  MEMORY:   Recent: impaired recent memory   Remote: impaired remote memory  COGNITIVE FUNCTION: Average intelligence  JUDGMENT: Impaired -  moderate  IMPULSE CONTROL: Poor  INSIGHT: Poor    Assessment of Risk for Suicidal Behavior:  Changes in risk for suicide from initial assessment:  No    [place] Suicide Severity Rating Scale (C-SSRS)  [place] Suicide Severity Rating Scale 06/12/2022   [place] Suicide Severity Rating Scale Risk Calculation Negative Screen   Some encounter information is confidential and restricted. Go to Review Flowsheets activity to see all data.           Assessment of Risk For Violent Behavior:  Changes in risk for violent  behavior from initial assessment:  No    Updates from Initial Diagnostic Assessment (session 1):  Have there been changes in psychiatric, medical, social, substance abuse, employment, or other relevant history since the initial intake?  Yes; Describe Client indicates misusing prescription medications that are not prescribed to her (percocet) for a headache.     Session Content/Interventions  SESSION CONTENT:     The purpose of today's appointment was to assess for psychosocial and safety risk changes as well as develop safety and treatment plans.  The client presented similarly to her appointment with writer today as she did last week. She continues to demonstrate poor judgment, insight, orientation, and memory. Client purchased marijuana shortly prior to her appointment with Clinical research associate. She walked through [employer] with an unrolled blunt despite Clinical research associate asking her to put it away. When client got to writer's office she spent some time cleaning her marijuana and rolling it before putting it away. Client continued to indicate that because it was Ecologist should not be concerned. Writer indicated that although marijuana is legal, she should not have substances out during  her appointments nor should be be using prior to appointments. The client denied use of substances prior to coming to [facility] today. The client did not indicate SI/HI, emotional lability, psychotic symptoms, or psychiatric decompensation that would warrant a higher level of care at this time.     The client shared more details related to her substance use, as indicated above. She believes that because alcohol and marijuana are legal, she can continue to consume however much she would like without consequences. The writer asked if substance use concerns are included in her safety planning to get her son back in custody. Client states she does not believe they have concerns with her use and is not motivated to make changes at this time.  She  perceives she is "clean" because she is no longer using illicit substances. Writer does worry the client may be experiencing alcohol withdrawal symptoms between use based on client's report. Writer shared dangers of continued alcohol use at her current rate and dangers of suddenly stopping. The writer also indicated that if client is sharing some depressive symptoms that alcohol and marijuana can worsen symptoms between use and long term. Client was not receptive to this information and feels her use only benefits her thoughts and emotions at this time. Client agreed to a ROI for [facility] today so writer will plan to reach out and determine status with their program(s). Client is still scheduled for a CD evaluation with Cristino Martes, CSAC on 7/6.      The client denies changes to her mental health symptoms. She states she missed her prior appointments at [facility] because she got lost on the bus. A short term 2015 form was approved by Clinical research associate, as it seems the client's memory and thought organization are impacting her ability to attend appointments. Lack of appointment attendance will increase the client's chances of missing her [facility] appointments and decompensating to the point of hospitalization. The client states she continues to wake up from nightmares related to her son. She continues to report low mood, tearfulness, and sadness that he is not in her care. When asked about memory concerns the client states she believes she noticed changes after her son was taken from her in the hospital.     The client had a challenging time engaging in safety planning with writer because she does not believe she has a mental illness and does not attest to ever having safety concerns/SI. She declined a copy of her safety plan. Client was able to identify some goals to improve her QOL in treatment planning. Though chart review indicates some concerns for suicide in the past, this has been outside of the 90 day period so  client is not currently on the suicide care pathway. Client is only motivated to continue attending [facility] in congruence with her CPS case. Due to providers' concerns about engagement and substance use, the writer created a treatment agreement specific to the client that will be reviewed at her next provider appointment.  Client is enrolled at [facility] effective today, 6/15. Writer will provide an update to client's legal SW, Harriett Rush regarding status, appointments, and treatment agreement.     UPDATE FROM 6/15: Writer reached out to [facility] and was able to confirm the client was intended to receive mental health and substance use treatment at that location, however, she missed her mental health intake appointment. Client attended her substance use evaluation on 6/2, though is not officially enrolled for treatment until  she attends an orientation appointment. Client missed her first orientation appointment on 6/9 and is rescheduled for today, 6/15. The writer noted client is enrolled at [facility] as of 6/13 for mental health treatment. Due to client's memory issues, writer expressed concerns about client receiving split care. The writer left a message for clinic coordinator, Misty Stanley, expressing concerns and will wait for a return call.     The writer also contacted [facility] (thought to be Harriett Rush per client report). Writer provided an update for client's appointments and status at [facility]. The writer also informed [facility] of treatment agreement that we will ask client to sign due to number of missed appointments thus far and client's ambivalence about engaging in care. [facility] indicated understanding. Writer agreed to provide updates to [facility] via secure email ([email or web address]) and [facility] is appreciative of this. She agreed to call the client to ask her not to attend orientation today so her care can be in one place.     INTERVENTIONS: Continued to develop  therapeutic relationship  Safety Planning.  Refer to safety plan document that was created at today's visit.  Supportive Psychotherapy  Treatment Planning    Working Diagnosis:    Diagnoses   Code Name Primary?   . F29 Psychosis, unspecified psychosis type Yes   . F43.10 Post traumatic stress disorder (PTSD)    . F19.10 Polysubstance abuse              Impression/Formulation:  Impression/formulation did not change as a result of information gained at today's appointment.  Please review content from appointment on 6/6 for further details.    The writer continues to strongly suspect paranoid schizophrenia. Client continues to be guarded and superficial. She continues to decline an emergency contact and is only open to ROIs for those she believes will help to get her son back. Substance use, lack of insight, judgment concerns, and memory issues continue to cloud the clinical picture.  Writer also questions if there could be a developmental delay due to her concrete thought process and issues with judgment.       Plan:  Ms. Mincer will be admitted for further mental health treatment in the [facility].  Treatment recommendations include:  individual psychotherapy (CBT, PST, DBT, CPT, IPT), medication management and evaluation for substance abuse treatment.        NEXT APPT: 6/16 with Shelby Mattocks, MD.       Barnabas Harries, LCSW
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
## 2022-06-10 NOTE — Telephone Encounter (Signed)
Pt needs to get pw for zoom meeting,  Pt can be reached @ [phone]. Thank you.

## 2022-06-11 ENCOUNTER — Telehealth: Payer: Self-pay

## 2022-06-11 NOTE — Telephone Encounter (Signed)
Writer was able to speak with patient to confirm patient's medical transportation. Patient was informed that her 2 month temporary 2015 has been approved and completed. Writer also confirmed with patient that she had received Clinical research associate text message confirming all scheduled appointments with scheduled transportation pick up times and confirmation number along with the taxi companies phone numbers. Patient transportation for 06/13/22 pick up time 1:00p (confirm# [id number]) [facility] [phone], 06/17/22 pick up time 2:45p (confirm# [id number]) [employer] [phone], 06/23/22 pick up time 8:15a (confirm# [id number]) [facility] [phone] and 07/03/22 pick up time 8:30a (confirm# [id number]) [facility] [phone]. Patient confirmed that she would have medical transportation home from each appointment, writer explained that process to her as well.

## 2022-06-12 NOTE — Care Coordination Plan (Signed)
Clinical Management Note     Client care discussed in team meeting this morning. Warm handoff for client's NPV with Bill Small scheduled for tomorrow.

## 2022-06-13 ENCOUNTER — Ambulatory Visit: Payer: Medicaid Other | Admitting: Psychiatry

## 2022-06-13 ENCOUNTER — Other Ambulatory Visit: Payer: Self-pay

## 2022-06-13 ENCOUNTER — Encounter: Payer: Self-pay | Admitting: Gastroenterology

## 2022-06-13 ENCOUNTER — Ambulatory Visit: Payer: Medicaid Other

## 2022-06-13 ENCOUNTER — Encounter: Payer: Self-pay | Admitting: Psychiatry

## 2022-06-13 VITALS — BP 119/68 | HR 92 | Wt 148.0 lb

## 2022-06-13 DIAGNOSIS — F4310 Post-traumatic stress disorder, unspecified: Secondary | ICD-10-CM

## 2022-06-13 DIAGNOSIS — F29 Unspecified psychosis not due to a substance or known physiological condition: Secondary | ICD-10-CM

## 2022-06-13 DIAGNOSIS — F1910 Other psychoactive substance abuse, uncomplicated: Secondary | ICD-10-CM

## 2022-06-13 MED ORDER — BENZTROPINE MESYLATE 1 MG PO TABS *I*
1.0000 mg | ORAL_TABLET | Freq: Two times a day (BID) | ORAL | 1 refills | Status: DC
Start: 2022-06-13 — End: 2024-11-07
  Filled 2022-06-13: qty 60, 30d supply, fill #0

## 2022-06-13 MED ORDER — HALOPERIDOL DECANOATE 100 MG/ML IM SOLN *I*
200.0000 mg | INTRAMUSCULAR | 1 refills | Status: AC
Start: 2022-06-13 — End: ?
  Filled 2022-06-13: qty 2, 28d supply, fill #0
  Filled 2022-07-03: qty 2, 28d supply, fill #1

## 2022-06-13 MED ORDER — HALOPERIDOL DECANOATE 100 MG/ML IM SOLN *I*
200.0000 mg | INTRAMUSCULAR | Status: AC
Start: 2022-06-13 — End: 2022-08-08
  Administered 2022-06-13: 200 mg via INTRAMUSCULAR

## 2022-06-13 NOTE — Progress Notes (Signed)
Injectable Medication Administration Progress Note     LENGTH OF SESSION: 15 minutes    Medication Administration  Administrations This Visit     haloperidol decanoate (HALDOL DECANOATE) injection 200 mg     Admin Date  06/13/2022  15:17 Action  Given Dose  200 mg Route  Intramuscular Site  Right Arm Administered By  Milderd Meager, RN                Physical Assessment (skin integrity):  Administration site clean, dry, intact    Tolerability:  Patient tolerated injection    Side Effects:   None    No abnormal movements on today's exam and No cogwheeling    Coun/Edu  Medication Teaching Provided    Plan  Recommendations:  F/U in 28 days - 07/11/22
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
## 2022-06-13 NOTE — Care Coordination Plan (Signed)
Clinical Management Note     The client presented to her medication provider appointment today. When nursing was taking client's vitals they noticed the client had a knife on her. Nash [facility] informed client she would need to give the knife to DPS while in the clinic and that she could have it when she left. Client was not going to hand over her knife so writer went to speak with client to reiterate the clinic rules and reason for such rules. Client was receptive to these limits after speaking with Clinical research associate and gave the knife to DPS for her appointment. She understand she cannot bring the knife and if she does that DPS will confiscate it for her appointments in the future. She also understands she can be wanded in the future due to these concerns.     Client shared uncertainty about wanting to stop drinking alcohol while waiting for her appointment. Writer expressed concern about the amount of alcohol client is consuming and noted it would likely not be safe for client to stop use without tapering due to endorsed withdrawal symptoms. Client is scheduled for a CD Evaluation in about 2 weeks.     The client asked if writer reached out to her CPS case worker, Dentist. Writer noted client only provided consent for her legal SW, not her CPS case worker.  Client indicated understanding and completed a ROI for Danella Maiers, CPS case worker that will be scanned into the client's chart.

## 2022-06-17 ENCOUNTER — Ambulatory Visit: Payer: Self-pay

## 2022-06-18 ENCOUNTER — Other Ambulatory Visit: Payer: Self-pay

## 2022-06-18 NOTE — Care Coordination Plan (Signed)
Clinical Management Note     Due to client bringing a knife to the clinic on 6/16 (for her own protection), the writer contacted DPS with upcoming appointments and asked client to be wanded prior to her appointments.     The Clinical research associate attempted outreach to client's CPS case worker, Danella Maiers [phone], [phone]) to collaborate on client's care. Alford Shorter was not available at the time of writer's call and writer left a VM with direct office number asking for a return call.

## 2022-06-19 ENCOUNTER — Telehealth: Payer: Self-pay

## 2022-06-19 NOTE — Care Coordination Plan (Signed)
Clinical Management Note     Client's care and discussion with CPS discussed during team meeting today.

## 2022-06-19 NOTE — Telephone Encounter (Addendum)
The writer was able to reach out to client's CPS case worker, Alexis Vang for collateral and collaboration of care.   Client's CPS case was initially opened due to client's lack of prenatal care and then drug addiction. Client's son was addicted to substances at birth and was intubated in the NICU. Client attempted to extubate her child and leave the hospital with him. CPS case remains open due to client's impaired judgment in being able to care for her child. At this time, the client is on supervised visits at the visitation center with 3 visits a week, each an hour long, typically on Monday, Wednesday, and Fridays. Recently the client has missed the past 3-4 consecutive visits. When Alexis Vang called to follow up with the client she indicated unable to visit for the next 2 weeks because she has a cold, but then called back the next day stating she wanted to visit her son, indicating continued concerns for judgment. Alexis Vang indicates witnessing changes to client's personality that could be congruent with DID. She indicates there have been times during visitations where the client's personality, tone of voice, speech, dress, and mannerisms change for a short period of time. She did not indicate client referring to another name, but also notes there was not an instance where she was referred to by name. Alexis Vang indicates this change did not seem to last for long, however during this change, [name] states the client appears to be less emotionally connected with her child, does not recall being pregnant and giving birth, but then also has tried to leave the visitation center with the child.     The writer inquired about possible plan for the client's child to be placed with client's sister in [place] as well as plans for the client to return to the client.  Alexis Vang indicated there are steps being made for the child to be placed with the client's sister, but the sister has to be approved as a foster parent and this  process takes time. She does not believe the child will be approved/placed with the sister within the next few months. Alexis Vang indicated the client is still on the first step of the child returning to her custody, though their long term plan is to return to parent. She noted client is not close to moving to the second step of regaining custody due to poor judgment, substance use, and mental health symptoms.     The writer informed Alexis Vang of upcoming appointments as well as writer's concerns for client's ability to appropriately engage in care, resulting in a treatment agreement. Writer also provided information for upcoming appointments and treatment recommendations at this time.

## 2022-06-20 ENCOUNTER — Telehealth: Payer: Self-pay

## 2022-06-20 NOTE — Telephone Encounter (Signed)
Writer received a call from legal SW, Harriett Rush, asking for an update because client has court later today. Writer provided an update on attended/missed/rescheduled appointments and client's treatment agreement to remain in care.

## 2022-06-23 ENCOUNTER — Ambulatory Visit: Payer: Medicaid Other

## 2022-06-23 ENCOUNTER — Other Ambulatory Visit: Payer: Self-pay

## 2022-06-23 DIAGNOSIS — F4310 Post-traumatic stress disorder, unspecified: Secondary | ICD-10-CM

## 2022-06-23 DIAGNOSIS — F1910 Other psychoactive substance abuse, uncomplicated: Secondary | ICD-10-CM

## 2022-06-23 DIAGNOSIS — F29 Unspecified psychosis not due to a substance or known physiological condition: Secondary | ICD-10-CM

## 2022-06-23 NOTE — Progress Notes (Signed)
Behavioral Health Progress Note     Length of Session: 30 minutes    Contact Type:  Location: On Site    Individual Psychotherapy     Problem(s)/Goals Addressed from Treatment Plan:    Problem 1:   Treatment Problem #1 06/10/2022   Patient Identified Problem CPS request/recommendation.       Goal for this problem:    Treatment Goal #1 06/10/2022   Patient Identified Goal Getting out of mental health courses.       Progress towards this goal: N/A - Initial plan    Mental Status Exam:  APPEARANCE: Well-groomed, Casual  ATTITUDE TOWARD INTERVIEWER: Cooperative, Guarded and Superficial  MOTOR ACTIVITY: WNL (within normal limits)  EYE CONTACT: Direct  SPEECH: Normal rate and tone and Age appropriate  AFFECT: Appropriate and Apathetic  MOOD: Anxious and Dysthymic  THOUGHT PROCESS: Concrete  THOUGHT CONTENT: Negative Rumination  PERCEPTION: Hallucinations type is uncertain. Client was smiling at inappropriate times during the conversation.  CURRENT SUICIDAL IDEATION: patient denies  CURRENT HOMICIDAL IDEATION: Patient denies  ORIENTATION: Alert and Oriented X 3.  CONCENTRATION: Fair  MEMORY:   Recent: intact   Remote: impaire  COGNITIVE FUNCTION: Average intelligence  JUDGMENT: Impaired -  moderate  IMPULSE CONTROL: Fair  INSIGHT: Poor    Risk Assessment:        ASSESSMENT OF RISK FOR SUICIDAL BEHAVIOR  Changes in risk for suicide from baseline Formulation of Risk and/or previous intake, including newly identified risk, if any: none     Current: Client is not on the suicide care pathway. Stressors/risk factors include chronic homelessness (staying with a friend), custody concerns with her son, chronic substance abuse, trauma history, lack of insight, memory concerns, poor judgment, and minimal natural supports.     Protective Factors: Client's main protective factor is her son. Her son is currently in foster care and the client hopes he will be transferred to her sister in [place] soon.     Violence risk was assessed  and No Change noted from baseline formulation of risk and/or previous assessment.    Session Content::      During the appointment the client was engaged, guarded, and cooperative. She had appropriate eye contact and speech. Client was frequently looking at the pictures in writer's office while talking and smiling at inappropriate times occasionally. The client presented similiarly to previous appointments in that her memory appears impaired, judgment and insight are poor, and concentration is fair. The client did not indicate or demonstrate SI/HI, emotional lability, psychotic symptoms, or psychiatric decompensation that would warrant a higher level of care at this time.     Client presented for her initial therapy appointment with writer today. Client shared her frustration with DPS having to wand her/search her because she brought a knife to the clinic at her previous appointment(s). Client feels the knife is necessary for her protection outside of the clinic. Writer provided validation and reiterated the clinic policies regarding weapons. Writer indicated she will be searched and wanded when she comes in for the time being. If she is able to leave the knife home in the future we can reconsider this process.     The client shared frustrations about 2 women staying with the same friend she is staying with. She shared concerns about her interactions with them and indicated attempts to set boundaries with them. There do not seem to be safety concerns at this time. Writer provided client with validation and asked client about more permanent  housing options. She stated she was able to find an apartment for about $600/month and thinks she will be able to afford the security deposit next month.      Writer inquired about client's son/recent visits.  Client states she has discontinued visits with her son/started limiting visits with her son. She shared her nightmares get worse after visiting him. She indicated negative  mood and trauma symptoms following visits with him. Client states she finds visiting him challenging because she does not like his current foster placement and visitation restrictions.  She indicates her way of retaliation is limiting contact with him and not complying with CPS recommendations. Client is still hoping her son will be placed with client's sister soon. Client indicated plan to move to [place] to be closer to her son when this happens and stated she is planning to find a home for herself, rather than stay with her sister. Client states her sister does not feel it is safe for client to move to [place], though client did not elaborate outside of believing the neighborhood might not be safe?     The writer asked about client's recent substance use. Client shared experiencing full body tremors yesterday after not having an alcoholic beverage for about 30 hours, per her report. She denied further withdrawal symptoms, states she did not lose consciousness, and states it improved after having a drink. Client indicated belief she was just dehydrated and was not experiencing alcohol withdrawal symptoms. Writer encouraged client to call 911 if she notices these symptoms and she feels she needs medical attention. Writer expressed concern client was experiencing withdrawal symptoms and that alcohol withdrawal can be dangerous/lethal. Client shared she did have some alcohol this morning though did not feel "it was enough." Though per treatment agreement, clients should not be coming to their appointments under the influence of alcohol or drugs, writer worries if the recommendation is to not use the client could be at risk for harm medically. Client did not appear or seem intoxicated. She did seem fatigued and noted she smoked marijuana throughout most of the day yesterday and felt this was impacting her energy level today still. Client shared this information as Clinical research associate was walking her to the front desk to  check out. Client is scheduled for a CD evaluation on 7/6.     Writer reminded client that she will only continue to meet with Clinical research associate for the next few weeks prior to writer's departure from [employer]. The writer asked client if she would like her new therapist to sit in at the next appointment. Client is in agreement with meeting her new therapist briefly at her appointment next week.     Visit Diagnosis:    Diagnoses   Code Name Primary?   . F29 Psychosis, unspecified psychosis type Yes   . F43.10 PTSD (post-traumatic stress disorder)    . F19.10 Polysubstance abuse              Interventions:  Continued to develop therapeutic relationship  Supportive Psychotherapy    Current Treatment Plan   Created/Updated On 06/10/2022   Next Treatment Plan Due 06/10/2023         Plan:  Psychotherapy continues as described in care plan; plan remains the same. and Continue Psychopharmacology regimen as prescribed. Client is not on the suicide care pathway, though Clinical research associate offered client to get scheduled in 1 or 2 weeks and client stated she would like to meet with writer next week to "stay  on track."    NEXT APPT: 1 week      Dontreal Miera L Euva Rundell, LCSW

## 2022-06-24 ENCOUNTER — Telehealth: Payer: Self-pay

## 2022-06-24 ENCOUNTER — Ambulatory Visit: Payer: Self-pay

## 2022-06-24 NOTE — Progress Notes (Signed)
Clinical Management Note   Writer attempted to schedule patient medical transportation into her appointment for 06/24/22 at 3:45p. Writer was unable to schedule due to the patient having a previously scheduled taxi to [address] for [facility]. Writer immediately made contact with patient to inform her of the scheduled appointment is which the patient states that she was unaware of. [name] had stated that she was getting ready to get on the bus and stated that she did not want to go to the appointment. Writer advised patient of the possible services that could be provided if she would be able to connect the CM. Patient was agreeable to going to the appointment before we disconnected the call. [name] Research officer, political party back shortly after requesting the taxi company's information in Therapist, occupational did not have and provided her with the contact for [facility] as well as her Medicaid number.     Writer contacted patient's legal Social Worker about patient's scheduled 11a appointment with [facility]. Misty Stanley stated that she had spoke with [name] as she called about a different situation and was advised that [name] had received a call stating that she had an appointment and that transportation had been setup. Writer stated that she had questioned patient's recent and upcoming appointment and the patient states that she couldn't remember. Patient had also advised Misty Stanley that she would be going to the scheduled appointment. Misty Stanley was unaware of the [facility] referral being completed. Misty Stanley was advised that [name] was still scheduled to meet with writer today 06/24/22 at 3:45p     Writer called HUNNY in attempted to confirm patient's [facility] referral status. Writer was informed that patient does have an active referral with [facility] through [facility] spoke with patient to follow up on patient 11a appointment with CM. Patient states that she met with Arvella Merles who would be he [facility]. Patient states that she was able to make it to her  appointment and that it was okay but it could've been worse. Patient states that they discussed her housing need and states that they will be working on completing housing application in the future. Patient provided Clinical research associate verbal consent to speak with Eileen Stanford to confirm that she was her CM as well as confirm that she made it to her appointment today.     Writer spoke with [facility] at [phone] at patient's request. Writer informed Eileen Stanford that verbal consent was given to confirm patient's appointment as well as ensure that she would be the CM working with patient longer term. Eileen Stanford was able to confirm both with Clinical research associate. Writer advised Eileen Stanford that she Clinical research associate would no longer be working with patient due to there being a duplicate of services.

## 2022-06-24 NOTE — Telephone Encounter (Signed)
Writer spoke with patient to inform her that it was confirmed that she's been enrolled in [facility] through [facility]. Patient was advised that her 3:45p would be cancelled with Clinical research associate and that Clinical research associate will no longer work with patient. Patient understood and agreed to follow up with her CM Eileen Stanford, Clinical research associate also attempted outreach to patients Nutritional therapist to inform her of the changes but was unable to do so at this time.

## 2022-06-30 ENCOUNTER — Ambulatory Visit: Payer: Self-pay

## 2022-07-03 ENCOUNTER — Telehealth: Payer: Self-pay

## 2022-07-03 ENCOUNTER — Ambulatory Visit: Payer: Self-pay

## 2022-07-03 ENCOUNTER — Other Ambulatory Visit: Payer: Self-pay

## 2022-07-03 NOTE — Telephone Encounter (Signed)
This Clinical research associate contacted patient on home phone.  Recording states that patient has a voicemail that is not set up yet. Try your call again later.

## 2022-07-04 ENCOUNTER — Other Ambulatory Visit: Payer: Self-pay

## 2022-07-07 ENCOUNTER — Ambulatory Visit: Payer: Self-pay | Admitting: Psychiatry

## 2022-07-07 ENCOUNTER — Telehealth: Payer: Self-pay

## 2022-07-07 NOTE — Telephone Encounter (Signed)
The Clinical research associate attempted outreach to client in noticing the client is not rescheduled with Clinical research associate and missed her CD evaluation with Cristino Martes. Client stated she did not know she missed the appointments because she did not know when they were scheduled and that transportation has been an issue. Writer indicated the appointments had been scheduled after discussion with her at her previous appointment with Clinical research associate. The writer noted it appears she has a CM and encouraged client to follow up with her CM regarding scheduled appointments and transportation needs. The writer reminded client she signed a treatment agreement that indicates she needs to attend appointments in order to remain enrolled, noting she is at risk for discharge if she continues to miss appointments. Client became angry and believes her CM should be fired instead. Writer reminded client of her responsibility for her own care. Client agreed to meet with writer on 7/12 at 10:15am and Cristino Martes on 7/28 at 9:30am. Client was confused as to writer's name, role, and address/location for mental health services. Writer clarified for client though continues to have concerns the client's memory issues are interfering with her ability to engage appropriately in outpatient care.

## 2022-07-09 ENCOUNTER — Ambulatory Visit: Payer: Self-pay

## 2022-07-09 NOTE — Care Coordination Plan (Signed)
Clinical Management Note     The client did not show for her appointment with writer today, 7/12. Writer contacted client on 7/10 to arrange the appointment and client stated she did not remember the appointment. The writer expressed concern the client was unable to remember her appointment today from two days ago. The writer reminded client of her appointment with Dr. Dolphus Jenny for tomorrow in which she would receive another LAI. Client states concern about the LAI due to pain in her arm from the LAI last month. Writer noted she could discuss medication concerns during her appointment with Dr. Dolphus Jenny tomorrow and provided appointment details. The writer expressed concern about the client's number of missed appointments and challenges in providing care to her given lack of appointment attendance. It seems as though the client cannot recall the number of appointments she has missed as she has missed many more than she made since intake. Client became frustrated with Clinical research associate noting this concern and stated "well than just discharge me" and ended the call inappropriately.

## 2022-07-10 ENCOUNTER — Ambulatory Visit: Payer: Self-pay

## 2022-07-10 ENCOUNTER — Ambulatory Visit: Payer: Self-pay | Admitting: Psychiatry

## 2022-07-10 ENCOUNTER — Telehealth: Payer: Self-pay

## 2022-07-10 NOTE — Care Coordination Plan (Signed)
Clinical Management Note     Client's care discussed in team meeting. Client continues to miss appointments with perceived barriers as client's memory and substance use. ACT referral discussed. Though client may not meet criteria based on hospitalizations, it seems this would be the only way the client could successfully engage in outpatient mental health treatment given her difficulties with engagement at [facility] over the past month.

## 2022-07-10 NOTE — Telephone Encounter (Signed)
Spoke with pt to rescsh missed injection  Pt is all set

## 2022-07-14 ENCOUNTER — Telehealth: Payer: Self-pay

## 2022-07-14 ENCOUNTER — Ambulatory Visit: Payer: Self-pay

## 2022-07-14 NOTE — Telephone Encounter (Signed)
Called pt to sch missed INJ  Vmail not set up inbasket follow up

## 2022-07-18 ENCOUNTER — Telehealth: Payer: Self-pay

## 2022-07-18 NOTE — Telephone Encounter (Signed)
The Clinical research associate contacted Philisia Neale Burly, client's CPS Legal SW, to collaborate. Writer informed Philisia the client has no showed a number of appointments and does not seem capable of engaging safely and effectively in an outpatient mental health setting. It appears the main barriers to client's care are her substance use, memory issues, and possible dissociative episodes. Writer noted it seems client would be appropriate for ACT and informed Philisia about this program. The writer noted the referral is mostly complete outside of the consent forms which would need to be signed by the client prior to submitting the referral. Philisia stated she was able to help client get a room in a rooming house and planning to meet with client today to give her the keys. She is in agreement with asking client about signing consent forms for ACT and submitting the referral. The writer noted client does seem to be established with a CM through [facility], though client has not attended an appointment at [facility] in order to get a consent form signed for CM for coordination. Philisia is in agreement with asking client to sign a ROI for CM. Writer sent a secure email to Philisia with a ROI and ACT referral. Writer noted due to client's challenges with engagement in care, there is a strong likelihood she will be discharged if she does not attend her CD evaluation on 7/28 with Steward Drone. Philisia indicated understanding and validated [facility] team concerns regarding ability to care for the client.

## 2022-07-24 NOTE — Progress Notes (Signed)
Clinical Management Note     Case discussed in treatment team meeting today.    The following recommendations were reviewed: client has an appt scheduled with embedded CD counselor for a CD eval on 7/28. If client does not present to this appt and follow Tx recommendations, it was recommended that this client be discharged due to repeated no shows to appointments.

## 2022-07-25 ENCOUNTER — Telehealth: Payer: Self-pay

## 2022-07-25 ENCOUNTER — Ambulatory Visit: Payer: Self-pay

## 2022-07-25 NOTE — Telephone Encounter (Signed)
This Clinical research associate called home number in which the message states, "This person has a voicemail that has not been set up.  Please try your call again later."

## 2022-07-25 NOTE — Telephone Encounter (Signed)
Called patient to reschedule missed injection on 07/25/22.  Rescheduled patient for 07/28/22 at 2 pm.

## 2022-07-28 ENCOUNTER — Ambulatory Visit: Payer: Self-pay

## 2022-07-29 NOTE — BH Discharge Summary (Signed)
Client is discharged from [facility] clinic as of 07/29/22 due to loss of contact with the client. Despite multiple outreach attempts, client has not been compliant with clinic recommendations. Clinic recommends that client utilize [facility] or ACT team resources for further [facility] treatment needs.

## 2022-08-07 ENCOUNTER — Other Ambulatory Visit: Payer: Self-pay | Admitting: Psychiatry

## 2022-08-07 ENCOUNTER — Other Ambulatory Visit: Payer: Self-pay

## 2022-08-11 ENCOUNTER — Other Ambulatory Visit: Payer: Self-pay

## 2022-08-11 ENCOUNTER — Telehealth: Payer: Self-pay

## 2022-08-11 NOTE — Telephone Encounter (Signed)
Patient NOS for Colposcopy appointment.

## 2022-08-12 NOTE — Telephone Encounter (Signed)
Writer attempted to contact patient. VMB not set up no active mychart account.

## 2022-08-12 NOTE — Telephone Encounter (Signed)
Letter pended and routed to mail to patient's address on file.

## 2022-08-18 NOTE — Telephone Encounter (Signed)
Writer called and spoke with patient. Patient scheduled.

## 2022-08-20 ENCOUNTER — Other Ambulatory Visit: Payer: Self-pay

## 2022-09-08 ENCOUNTER — Other Ambulatory Visit: Payer: Self-pay

## 2022-09-08 ENCOUNTER — Telehealth: Payer: Self-pay

## 2022-09-08 NOTE — Telephone Encounter (Signed)
Patient NOS for Colposcopy appointment. No address on file (patient housing status is currently listed as homeless), and MyChart not active. Message routed to reschedule appointment.

## 2022-09-08 NOTE — Telephone Encounter (Signed)
Writer called and spoke with patient. Patient scheduled Address updated.

## 2022-10-12 ENCOUNTER — Emergency Department: Payer: Medicaid Other

## 2022-10-12 ENCOUNTER — Other Ambulatory Visit: Payer: Self-pay

## 2022-10-12 ENCOUNTER — Emergency Department
Admission: EM | Admit: 2022-10-12 | Discharge: 2022-10-12 | Disposition: A | Discharge status: Home / Self Care | Payer: Medicaid Other | Source: Ambulatory Visit | Attending: Emergency Medicine | Admitting: Emergency Medicine

## 2022-10-12 ENCOUNTER — Encounter: Payer: Self-pay | Admitting: Emergency Medicine

## 2022-10-12 ENCOUNTER — Observation Stay
Admission: EM | Admit: 2022-10-12 | Discharge: 2022-10-19 | Disposition: A | Discharge status: Home / Self Care | Payer: Medicaid Other | Source: Ambulatory Visit | Attending: Psychiatry | Admitting: Psychiatry

## 2022-10-12 ENCOUNTER — Encounter: Payer: Self-pay | Admitting: Gastroenterology

## 2022-10-12 DIAGNOSIS — F1490 Cocaine use, unspecified, uncomplicated: Secondary | ICD-10-CM | POA: Insufficient documentation

## 2022-10-12 DIAGNOSIS — F29 Unspecified psychosis not due to a substance or known physiological condition: Secondary | ICD-10-CM | POA: Insufficient documentation

## 2022-10-12 DIAGNOSIS — Y9289 Other specified places as the place of occurrence of the external cause: Secondary | ICD-10-CM | POA: Insufficient documentation

## 2022-10-12 DIAGNOSIS — Z91148 Patient's other noncompliance with medication regimen for other reason: Secondary | ICD-10-CM | POA: Insufficient documentation

## 2022-10-12 DIAGNOSIS — R5383 Other fatigue: Secondary | ICD-10-CM

## 2022-10-12 DIAGNOSIS — R059 Cough, unspecified: Secondary | ICD-10-CM | POA: Insufficient documentation

## 2022-10-12 DIAGNOSIS — L309 Dermatitis, unspecified: Secondary | ICD-10-CM | POA: Insufficient documentation

## 2022-10-12 DIAGNOSIS — S60511A Abrasion of right hand, initial encounter: Secondary | ICD-10-CM | POA: Insufficient documentation

## 2022-10-12 DIAGNOSIS — Y998 Other external cause status: Secondary | ICD-10-CM | POA: Insufficient documentation

## 2022-10-12 DIAGNOSIS — R45851 Suicidal ideations: Principal | ICD-10-CM | POA: Insufficient documentation

## 2022-10-12 DIAGNOSIS — F17210 Nicotine dependence, cigarettes, uncomplicated: Secondary | ICD-10-CM | POA: Insufficient documentation

## 2022-10-12 DIAGNOSIS — L299 Pruritus, unspecified: Secondary | ICD-10-CM | POA: Insufficient documentation

## 2022-10-12 DIAGNOSIS — R0600 Dyspnea, unspecified: Secondary | ICD-10-CM

## 2022-10-12 DIAGNOSIS — J3489 Other specified disorders of nose and nasal sinuses: Secondary | ICD-10-CM

## 2022-10-12 DIAGNOSIS — X789XXA Intentional self-harm by unspecified sharp object, initial encounter: Secondary | ICD-10-CM | POA: Insufficient documentation

## 2022-10-12 DIAGNOSIS — Z789 Other specified health status: Secondary | ICD-10-CM

## 2022-10-12 DIAGNOSIS — F1410 Cocaine abuse, uncomplicated: Secondary | ICD-10-CM

## 2022-10-12 DIAGNOSIS — Y9389 Activity, other specified: Secondary | ICD-10-CM | POA: Insufficient documentation

## 2022-10-12 DIAGNOSIS — R0602 Shortness of breath: Secondary | ICD-10-CM

## 2022-10-12 DIAGNOSIS — R0981 Nasal congestion: Secondary | ICD-10-CM

## 2022-10-12 DIAGNOSIS — J45909 Unspecified asthma, uncomplicated: Secondary | ICD-10-CM | POA: Insufficient documentation

## 2022-10-12 DIAGNOSIS — F32A Depression, unspecified: Secondary | ICD-10-CM | POA: Insufficient documentation

## 2022-10-12 DIAGNOSIS — F4310 Post-traumatic stress disorder, unspecified: Secondary | ICD-10-CM | POA: Insufficient documentation

## 2022-10-12 LAB — HOLD LAVENDER

## 2022-10-12 LAB — HOLD SST

## 2022-10-12 LAB — HOLD BLUE

## 2022-10-12 LAB — HOLD GREEN WITH GEL

## 2022-10-12 MED ORDER — ALBUTEROL SULFATE HFA 108 (90 BASE) MCG/ACT IN AERS *I*
1.0000 | INHALATION_SPRAY | Freq: Once | RESPIRATORY_TRACT | Status: AC
Start: 2022-10-12 — End: 2022-10-12
  Administered 2022-10-12: 1 via RESPIRATORY_TRACT
  Filled 2022-10-12: qty 6.7

## 2022-10-12 MED ORDER — ALBUTEROL SULFATE HFA 108 (90 BASE) MCG/ACT IN AERS *I*
1.0000 | INHALATION_SPRAY | Freq: Four times a day (QID) | RESPIRATORY_TRACT | 2 refills | Status: AC | PRN
Start: 2022-10-12 — End: 2023-06-07
  Filled 2022-10-12: qty 8.5, 17d supply, fill #0
  Filled 2023-05-13: qty 8.5, 25d supply, fill #0

## 2022-10-12 MED ORDER — ALBUTEROL SULFATE (2.5 MG/3ML) 0.083% IN NEBU *I*
2.5000 mg | INHALATION_SOLUTION | RESPIRATORY_TRACT | Status: DC | PRN
Start: 2022-10-12 — End: 2022-10-12

## 2022-10-12 MED ORDER — DEXTROSE 5 % FLUSH FOR PUMPS *I*
0.0000 mL/h | INTRAVENOUS | Status: DC | PRN
Start: 2022-10-12 — End: 2022-10-12

## 2022-10-12 MED ORDER — IPRATROPIUM-ALBUTEROL 0.5-2.5 MG/3ML IN SOLN *I*
9.0000 mL | Freq: Once | RESPIRATORY_TRACT | Status: AC
Start: 2022-10-12 — End: 2022-10-12
  Administered 2022-10-12: 9 mL via RESPIRATORY_TRACT
  Filled 2022-10-12: qty 9

## 2022-10-12 MED ORDER — ALBUTEROL SULFATE HFA 108 (90 BASE) MCG/ACT IN AERS *I*
1.0000 | INHALATION_SPRAY | Freq: Four times a day (QID) | RESPIRATORY_TRACT | 2 refills | Status: DC | PRN
Start: 2022-10-12 — End: 2022-10-12

## 2022-10-12 MED ORDER — SODIUM CHLORIDE 0.9 % FLUSH FOR PUMPS *I*
0.0000 mL/h | INTRAVENOUS | Status: DC | PRN
Start: 2022-10-12 — End: 2022-10-12

## 2022-10-12 MED ORDER — DEXAMETHASONE SODIUM PHOSPHATE 4 MG/ML INJ SOLN *WRAPPED*
6.0000 mg | Freq: Once | INTRAMUSCULAR | Status: AC
Start: 2022-10-12 — End: 2022-10-12
  Administered 2022-10-12: 6 mg via INTRAVENOUS
  Filled 2022-10-12: qty 2

## 2022-10-12 NOTE — Discharge Instructions (Addendum)
You were seen here today due to wheezing and cough and shortness of breath.  Your wheezes responded well to nebulizer treatment.  I have prescribed you an outpatient albuterol inhaler to take as well.  This is likely a viral illness inflaming your asthma.  It is very important that you do not take any more cocaine as this will exacerbate your symptoms and could possibly lead to a very dangerous situation in your lungs and heart or even death.  I have referred you to one of our primary care departments [place] in the city.  Please do not miss this appointment.  Pick up the additional inhaler that I prescribed you at your outpatient pharmacy [facility]).     Call your local emergency number (911 in the [place]) if:   You have severe shortness of breath.    The skin around your neck and ribs pulls in with each breath.    Your peak flow numbers are in the red zone of your AAP.    Return to the emergency department if:   You have shortness of breath, even after you take your short-term medicine as directed.    Your lips or nails turn blue or gray.    Call your doctor or asthma specialist if:   You run out of medicine before your next refill is due.    Your symptoms get worse.    You need to take more medicine than usual to control your symptoms.    You have questions or concerns about your condition or care.

## 2022-10-12 NOTE — ED Notes (Signed)
Pt discharge instructions reviewed. Pt comfortable with discharge planning and verbalizes understanding of discharge instruction. Pt ambulatory without assistance and steady gait. Tolerated PO. Pt verbalizes understanding of follow up care and medication regimen. Pt verbalizes access to residence and pt taking a medicab home. Pt belongings with pt and weather appropriate clothing in place. Pt safe to discharge at this time. IV removed.

## 2022-10-12 NOTE — CPEP Notes (Signed)
CPEP Charge Nurse Note    Report received from: ambulatory RN, ambulatory, voluntarily accompanied by patient alone.    Reason for presentation SI denies taking any medication.    Chief Complaint   Patient presents with    Suicidal       Allergies as of 10/12/2022 - Up to Date 10/12/2022   Allergen Reaction Noted    Eggs or egg-derived products Hives 06/10/2022    Food Nausea And Vomiting 04/15/2022       Past Medical History:   Diagnosis Date    Anxiety     Asthma     Depression     Gestational diabetes     Preeclampsia     Scabies     Substance abuse      Substance use: denies    Ingestion: Denies    Medical clearance: NA    Safety Considerations none    Vital signs:  Last Filed Vitals    10/12/22 2312   BP: 132/73   Pulse: 90   Temp: 36.1 C (97 F)   SpO2: 99%     Last Nursing documented pain:  0-10 Scale: 2 (10/12/22 2312)    Pre-Arrival Notifications: No    Willaim Rayas, RN, 11:23 PM

## 2022-10-12 NOTE — ED Triage Notes (Signed)
Pt via EMS w/ c/o shortness of breath & cough onset approx 0500. Hx asthma. Denies lightheadedness/dizziness, numbness/tingling.       Prehospital medications given: Yes  Respiratory/Allergic Reaction: Albuterol

## 2022-10-12 NOTE — ED Provider Notes (Signed)
History     Chief Complaint   Patient presents with    Shortness of Breath            [name] Faries is a 31 y.o. female with hx of Substance abuse, homelessness, and asthma which he does not manage with outpatient medications due to her social/behavioral situation presents with sudden onset shortness of breath and wheezing and cough that she states started around 5 AM.  States she has not taken anything for this.  Denies any production from her cough and has not had any fevers or chills or sweats, although does look slightly diaphoretic here.  States that she last used cocaine within the last 24 hours.  Denies any chest pain or chest pressure.  Denies any syncopal episodes.  States that this feels just like her asthma exacerbations she gets now and then.  Has no close follow-up with PCP as she does not currently have a PCP.       Medical/Surgical/Family History     Past Medical History:   Diagnosis Date    Anxiety     Asthma     Depression     Gestational diabetes     Preeclampsia     Scabies     Substance abuse         Patient Active Problem List   Diagnosis Code    Paranoia F22    Scabies B86    High-risk pregnancy O09.90    PTSD (post-traumatic stress disorder) F43.10    History of cesarean section complicating pregnancy O34.219    H/O fetal anomaly in prior pregnancy, currently pregnant O09.299    History of gestational diabetes in prior pregnancy, currently pregnant O09.299, Z86.32    Hx of preeclampsia, prior pregnancy, currently pregnant O09.299    Substance use disorder F19.90    Homeless Z59.00    Asthma during pregnancy O99.519, J45.909    Trichomonas infection A59.9    Staph aureus infection A49.01    Rash of hands R21    Suicidal ideation R45.851    Rubella non-immune status, antepartum O09.899, Z28.39    GBS (group B Streptococcus carrier), +RV culture, currently pregnant O99.820    History of cesarean delivery Z98.891    Complex care coordination Z71.89            Past Surgical History:   Procedure  Laterality Date    CESAREAN SECTION, LOW TRANSVERSE      Left leg surgery      TONSILLECTOMY AND ADENOIDECTOMY            Social History     Tobacco Use    Smoking status: Every Day     Packs/day: 0.50     Types: Cigarettes     Start date: 2002    Smokeless tobacco: Former     Types: Snuff, Chew   Substance Use Topics    Alcohol use: Yes     Comment: last drink unknown at this time    Drug use: Yes     Types: Cocaine, Methamphetamines, IV, Heroin, Marijuana     Comment: "Yes, Cocaine, marijuanna, meth, heroin" - No addtional insight provided             Review of Systems   Constitutional:  Positive for fatigue. Negative for activity change, appetite change, chills, diaphoresis and fever.   HENT:  Positive for congestion, rhinorrhea and sore throat.    Respiratory:  Positive for cough, shortness of breath and wheezing. Negative for chest  tightness and stridor.    Cardiovascular:  Negative for chest pain, palpitations and leg swelling.   Gastrointestinal:  Negative for abdominal pain and nausea.   Genitourinary:  Negative for decreased urine volume and difficulty urinating.   Musculoskeletal:  Negative for arthralgias, myalgias and neck pain.   Allergic/Immunologic: Negative for immunocompromised state.   Neurological:  Negative for dizziness, syncope and light-headedness.   Psychiatric/Behavioral:  Negative for agitation and confusion. The patient is nervous/anxious.        Physical Exam     Triage Vitals  Triage Start: Start, (10/12/22 1610)  First Recorded BP: 171/81, Resp: (!) 26, Temp: 36.9 C (98.5 F), Temp src: TEMPORAL Oxygen Therapy SpO2: 96 %, O2 Device: None (Room air), Heart Rate: 80, (10/12/22 9604)  .  First Pain Reported  0-10 Scale: 0, (10/12/22 5409)       Physical Exam  Vitals and nursing note reviewed.   Constitutional:       General: She is not in acute distress.     Appearance: She is well-developed. She is not ill-appearing, toxic-appearing or diaphoretic.   HENT:      Head: Normocephalic and  atraumatic.      Mouth/Throat:      Mouth: Mucous membranes are moist.   Eyes:      Pupils: Pupils are equal, round, and reactive to light.   Cardiovascular:      Rate and Rhythm: Normal rate.   Pulmonary:      Effort: Pulmonary effort is normal. Tachypnea present. No bradypnea, accessory muscle usage or respiratory distress.      Breath sounds: Normal breath sounds. No rhonchi or rales.   Musculoskeletal:      Cervical back: Normal range of motion and neck supple.   Skin:     General: Skin is warm.      Coloration: Skin is not cyanotic or pale.   Neurological:      Mental Status: She is alert and oriented to person, place, and time.   Psychiatric:         Mood and Affect: Mood normal.         Behavior: Behavior normal.         Medical Decision Making       Patient seen by me on arrival date of 10/12/2022 at at time of arrival  6:11 AM.  Initial face to face evaluation time noted above may be discrepant due to patient acuity and delay in documentation.    Assessment:  31 y.o., female comes to the ED with acute onset shortness of breath this morning for which she has taken nothing.  Has a cough that is nonproductive and has some mild throat discomfort. Respiratory rate is increased, but vitals including O2 sats are otherwise normal.  Diffuse expiratory wheezing on exam with otherwise no asymmetry.  Slight diaphoresis to the brow, but otherwise normal skin tone and turgor most mucous membranes.  Not in any distress.    Differential Diagnosis includes:  - Viral illness  - Asthma exacerbation  - Crack Lung  - PNA  - Allergic reaction-all consistent with story or exam    Plan:   Orders Placed This Encounter    *Chest standard frontal and lateral views    *Chest standard frontal and lateral views    Hold SST    Hold blue    Hold green with gel    Hold lavender    ED/UC REFERRAL TO PRIMARY CARE    EKG 12  lead (initial)    Insert peripheral IV    ipratropium-albuterol (DUONEB) 0.5-2.5mg  /58mL nebulization solution 9 mL     dexAMETHasone (DECADRON) injection 6 mg    albuterol HFA (PROVENTIL, VENTOLIN, PROAIR HFA) inhaler 1 puff    albuterol HFA (PROVENTIL, VENTOLIN, PROAIR HFA) 108 (90 Base) MCG/ACT inhaler     Addendum: EKG without evidence of dysrhythmia or ischemia.  Patient responded quite well to DuoNeb and steroid treatment and sounds very clear at this time.  I observed her for a few hours to ensure that she was not becoming wheezy again.  Chest x-ray is benign.  Observed to be walking to and from the bathroom without difficulty and speaking in long sentences on the phone without difficulty.  Given an albuterol inhaler and hand here as well as a prescription to pick up the outpatient pharmacy on her way out.  This is to ensure that she does not go home without anaphylaxis to a rescue inhaler.  Given a referral to primary care and instructed to follow-up with them closely to better manage her reactive airways and ensure that no other health conditions are being ignored.            Thayer Dallas, MD          Author:  Thayer Dallas, MD       Victoriano Lain, MD  10/12/22 [phone]

## 2022-10-12 NOTE — ED Triage Notes (Signed)
Endorsing increased depression and suicidal ideation. No HI. Has been on medication in the past but currently not taking anything. Denies ETOH/drug use. C/o dry skin on hands.     Prehospital medications given: No

## 2022-10-13 DIAGNOSIS — B369 Superficial mycosis, unspecified: Secondary | ICD-10-CM

## 2022-10-13 DIAGNOSIS — F4310 Post-traumatic stress disorder, unspecified: Secondary | ICD-10-CM

## 2022-10-13 DIAGNOSIS — R45851 Suicidal ideations: Secondary | ICD-10-CM

## 2022-10-13 DIAGNOSIS — F1490 Cocaine use, unspecified, uncomplicated: Secondary | ICD-10-CM

## 2022-10-13 DIAGNOSIS — F32A Depression, unspecified: Secondary | ICD-10-CM

## 2022-10-13 DIAGNOSIS — F29 Unspecified psychosis not due to a substance or known physiological condition: Secondary | ICD-10-CM

## 2022-10-13 LAB — EKG 12-LEAD
P: 59 deg
PR: 147 ms
QRS: 64 deg
QRSD: 88 ms
QT: 370 ms
QTc: 457 ms
Rate: 91 {beats}/min
T: 90 deg

## 2022-10-13 MED ORDER — ARIPIPRAZOLE 10 MG PO TABS *I*
5.0000 mg | ORAL_TABLET | Freq: Every day | ORAL | Status: DC
Start: 2022-10-13 — End: 2022-10-19
  Filled 2022-10-13 (×6): qty 1

## 2022-10-13 MED ORDER — ALUM & MAG HYDROXIDE-SIMETH 200-200-20 MG/5ML PO SUSP *I*
30.0000 mL | Freq: Three times a day (TID) | ORAL | Status: DC | PRN
Start: 2022-10-13 — End: 2022-10-19

## 2022-10-13 MED ORDER — FLUCONAZOLE 150 MG PO TABS *I*
150.0000 mg | ORAL_TABLET | Freq: Every evening | ORAL | Status: DC
Start: 2022-10-13 — End: 2022-10-14
  Administered 2022-10-13: 150 mg via ORAL
  Filled 2022-10-13 (×2): qty 1

## 2022-10-13 MED ORDER — EUCERIN EX CREA *I*
TOPICAL_CREAM | CUTANEOUS | Status: DC | PRN
Start: 2022-10-13 — End: 2022-10-13
  Filled 2022-10-13: qty 57

## 2022-10-13 MED ORDER — ACETAMINOPHEN 325 MG PO TABS *I*
325.0000 mg | ORAL_TABLET | Freq: Once | ORAL | Status: AC
Start: 2022-10-13 — End: 2022-10-13
  Administered 2022-10-13: 325 mg via ORAL

## 2022-10-13 MED ORDER — TERBINAFINE HCL 1 % EX CREA *I*
TOPICAL_CREAM | Freq: Two times a day (BID) | CUTANEOUS | Status: DC
Start: 2022-10-13 — End: 2022-10-19
  Filled 2022-10-13: qty 15

## 2022-10-13 MED ORDER — HYDROXYZINE HCL 25 MG PO TABS *I*
50.0000 mg | ORAL_TABLET | Freq: Three times a day (TID) | ORAL | Status: DC | PRN
Start: 2022-10-13 — End: 2022-10-13
  Administered 2022-10-13: 50 mg via ORAL
  Filled 2022-10-13: qty 2

## 2022-10-13 MED ORDER — ALBUTEROL SULFATE HFA 108 (90 BASE) MCG/ACT IN AERS *I*
2.0000 | INHALATION_SPRAY | Freq: Four times a day (QID) | RESPIRATORY_TRACT | Status: DC | PRN
Start: 2022-10-13 — End: 2022-10-19
  Administered 2022-10-13 – 2022-10-17 (×6): 2 via RESPIRATORY_TRACT
  Filled 2022-10-13 (×3): qty 6.7

## 2022-10-13 MED ORDER — MAGNESIUM HYDROXIDE 400 MG/5ML PO SUSP *I*
30.0000 mL | Freq: Every day | ORAL | Status: DC | PRN
Start: 2022-10-13 — End: 2022-10-19

## 2022-10-13 MED ORDER — OLANZAPINE 5 MG PO TBDP *I*
5.0000 mg | ORAL_TABLET | Freq: Four times a day (QID) | ORAL | Status: DC | PRN
Start: 2022-10-13 — End: 2022-10-19
  Administered 2022-10-13 – 2022-10-19 (×2): 5 mg via ORAL
  Filled 2022-10-13 (×3): qty 1

## 2022-10-13 MED ORDER — ACETAMINOPHEN 325 MG PO TABS *I*
ORAL_TABLET | ORAL | Status: DC
Start: 2022-10-13 — End: 2022-10-13
  Filled 2022-10-13: qty 1

## 2022-10-13 MED ORDER — NICOTINE POLACRILEX 2 MG MT GUM *I*
2.0000 mg | CHEWING_GUM | OROMUCOSAL | Status: DC | PRN
Start: 2022-10-13 — End: 2022-10-19
  Administered 2022-10-16 – 2022-10-17 (×3): 2 mg via ORAL
  Filled 2022-10-13 (×3): qty 1

## 2022-10-13 MED ORDER — ESCITALOPRAM OXALATE 10 MG PO TABS *I*
10.0000 mg | ORAL_TABLET | Freq: Every day | ORAL | Status: DC
Start: 2022-10-13 — End: 2022-10-19
  Administered 2022-10-13 – 2022-10-19 (×7): 10 mg via ORAL
  Filled 2022-10-13 (×7): qty 1

## 2022-10-13 MED ORDER — HYDROXYZINE HCL 25 MG PO TABS *I*
50.0000 mg | ORAL_TABLET | Freq: Four times a day (QID) | ORAL | Status: DC | PRN
Start: 2022-10-13 — End: 2022-10-19
  Administered 2022-10-13 – 2022-10-17 (×3): 50 mg via ORAL
  Filled 2022-10-13 (×3): qty 2

## 2022-10-13 MED ORDER — IBUPROFEN 200 MG PO TABS *I*
600.0000 mg | ORAL_TABLET | Freq: Four times a day (QID) | ORAL | Status: DC | PRN
Start: 2022-10-13 — End: 2022-10-19
  Administered 2022-10-16 – 2022-10-19 (×5): 600 mg via ORAL
  Filled 2022-10-13 (×5): qty 3

## 2022-10-13 MED ORDER — MELATONIN 3 MG PO TABS *I*
3.0000 mg | ORAL_TABLET | Freq: Every day | ORAL | Status: DC
Start: 2022-10-13 — End: 2022-10-19
  Administered 2022-10-13 – 2022-10-18 (×7): 3 mg via ORAL
  Filled 2022-10-13 (×7): qty 1

## 2022-10-13 MED ORDER — ACETAMINOPHEN 325 MG PO TABS *I*
650.0000 mg | ORAL_TABLET | ORAL | Status: DC | PRN
Start: 2022-10-13 — End: 2022-10-13
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
## 2022-10-13 NOTE — CPEP Notes (Signed)
[facility] Provider Progress Note    Patient seen and evaluated by me today, 10/13/2022 at 10:00 am.     Subjective/Patient Concerns:  Alexis Vang is sleeping soundly in the milieu when approached for interview. She is drowsy, but is willing to speak with Clinical research associate, Dr Heron Sabins and medical student.   She is seen scratching the top/webbing of her right hand frequently throughout interview. She states that this has occurred for quite some time, has been tested for scabies in the past (with apparent negative result). Dermatology consult to be requested.   She also reports currently menstruating, with a headache and cramps, she will be offered pain medication. She states that she is not interested in engaging with outpatient mental health treatment, because it is inconvenient to have to leave her apartment and take a bus to get to an appointment. She would prefer to engage in group therapy inpatient because it is more convenient, and she states that she liked it when she was inpatient before, she would like to learn more coping skills. She asks if she can go upstairs, [facility] status is explained to her. She is interested in IPCD at this time for crack cocaine, ETOH and THC. Referrals will be completed. She will not share any personal informatoon at this time, such as details on where her son is, if she has any family or personal collaterals.      Interim Events and Behavioral Observations: No acute concerns or events documented or reported. She declined her scheduled Abilify this morning, stating she that she does not need it. However she then asks for a higher dose of escitalopram. Medication education provided, and explained that Abilify can be used to adjunct the escitalopram for treatment of depression. Abilify will be continued at this time, continue with medication education. She used hydroxyzine last night and reported it helped with sleep, which she has had issues with when sober lately. Medication education provided  about use of hydroxyzine during the day for anxiety as well.     Meds:    melatonin  3 mg Oral Daily @ 1800    escitalopram  10 mg Oral Daily    ARIPiprazole  5 mg Oral Daily       PRN's:   PRN Medications   Medication Dose Frequency Last Admin    albuterol HFA (PROVENTIL, VENTOLIN, PROAIR HFA) inhaler 2 puff  2 puff Q6H PRN 2 puff at 10/13/22 1359    hydrOXYzine HCl (ATARAX) tablet 50 mg  50 mg Q6H PRN      OLANZapine (ZyPREXA ZYDIS) disintegrating tablet 5 mg  5 mg Q6H PRN      acetaminophen (TYLENOL) tablet 650 mg  650 mg Q4H PRN      aluminum & magnesium hydroxide w/simethicone (MAALOX ADVANCED REGULAR) suspension 30 mL  30 mL Q8H PRN      magnesium hydroxide (MILK OF MAGNESIA) 400 MG/5ML suspension 30 mL  30 mL Daily PRN      nicotine polacrilex (NICORETTE) gum 2 mg  2 mg Q2H PRN      eucerin cream   PRN         Most recent Vitals:  Vitals:    10/12/22 2312 10/13/22 0000   BP: 132/73 132/70   BP Location: Left arm Right arm   Pulse: 90 99   Temp: 36.1 C (97 F) 36.4 C (97.5 F)   TempSrc: Temporal Temporal   SpO2: 99% 98%   Weight: 56.7 kg (125 lb)    Height: 1.702  m (5\' 7" )        [place] Suicide Severity Rating:  [place] Suicide Severity Rating Scale Screen - Past Month       1. Have you wished you were dead or wished you could go to sleep and not wake up?: Yes       2. Have you actually had any thoughts of killing yourself?: No       3. Have you been thinking about how you might do this?: No       4. Have you had these thoughts and had some intention of acting on them?: No       5. Have you started to work out or worked out the details of how to kill yourself? Do you intend to carry out this plan?: No       6. Have you done anything, started to do anything, or prepared to do anything to end your life?: No       7. Was this within the past three months?: No  [place] Suicide Severity Rating Scale Risk Calculation: Low    Mental Status Exam:  Mental Status Exam  Appearance: Disheveled (wearing street  clothes, which are dirty)  Relationship to Interviewer: Engages well, Guarded  Psychomotor Activity: Normal  Abnormal Movements: None  Muscle Strength and Tone: Normal  Station/Gait : Normal  Speech : Regular rate, Normal tone, Normal rhythm  Language: Fluent  Mood: Dysphoric  Affect: Dysphoric  Thought Process: Goal-directed  Thought Content: No homicidal ideation, No delusions, No obsessions/compulsions, Suicidal ideation  Perceptions/Associations : No hallucinations  Sensorium: Alert  Cognition: Fair attention span  [employer] of Knowledge: Normal  Insight : Poor  Judgement: Poor      Additional data: All labs in the last 72 hours:  Recent Results (from the past 72 hour(s))   Hold SST    Collection Time: 10/12/22  9:21 AM   Result Value Ref Range    Hold SST HOLD TUBE    Hold blue    Collection Time: 10/12/22  9:21 AM   Result Value Ref Range    Hold Blue HOLD TUBE    Hold green with gel    Collection Time: 10/12/22  9:21 AM   Result Value Ref Range    Hold Green (w/gel,spun) HOLD TUBE    Hold lavender    Collection Time: 10/12/22  9:21 AM   Result Value Ref Range    Hold Lav HOLD TUBES    EKG 12 lead (initial)    Collection Time: 10/12/22  1:22 PM   Result Value Ref Range    Rate 91 bpm    PR 147 ms    P 59 deg    QRSD 88 ms    QT 370 ms    QTc 457 ms    QRS 64 deg    T 90 deg    /   /      Assessment: Alexis Vang  is a 31 y.o.  y/o female  with history unspecified psychosis, SUD (cocaine, ETOH, THC), PTSD, mood disorder,  prior CPEP presentations (most recent 04/11/22) and prior IP psychiatric admissions (most recent 04/12/22-05/14/22), inconsistent engagement with OP treatment, who was admitted on 9.13 status after presenting voluntarily to ED with report of increased SI with plan to jump off a bridge and increased depression in setting of medication noncompliance, active substance use, limited coping skills).   Alexis Vang  was restarted on escitalopram 10 mg QAM and started on Abilify  5 mg QAM. She has declined  to take the scheduled Abilify. She continues to present with dysphoric mood, request to stay inpatient for mental health or be transferred for chemical dependency. She requests to learn coping skills from group therapy, as well as take medication while in the hospital. She is not interested in attending outpatient mental health. Little improvement has been seen at this point, Alexis Vang has spent less than one full day in [facility], she will continue on current status to further determine her needs. She shows poor insight into her illness and judgment when it comes to treatment.     Plan  - Continue 9.13  -Medication Changes:No changes made   -Labs: Needs to be Obtained  -Collateral: Need to be collected  -Follow Up Intake/Appointments: Needs Connection to [facility]/IP or [facility]  -Referrals:Care Management  -Safety Plan/DIRA: Completed/Reviewed        Final diagnoses:   PTSD (post-traumatic stress disorder)   Cocaine use disorder   Depression, unspecified depression type   Psychosis, unspecified psychosis type       Plan:  Legal Status: 9.13      Reasons for Continued Stay: Unable to care for self in a less structured environment, Stabilization of gains, and Need for safe D/C    Author: Jonell Cluck, NP  as of: 10/13/2022  at: 3:07 PM       Kendre Sires L, NP  10/13/22 1609

## 2022-10-13 NOTE — CPEP Notes (Signed)
Pt c/o of 9/10 headache and difficulty sleeping. MD notified. Pt ordered and administered Melatonin 3 mg, Hydroxyzine 50 mg and Tylenol 325 mg PO. Currently monitoring and offering therapeutic support.

## 2022-10-13 NOTE — CPEP Notes (Signed)
[facility] admission completed. Patient wanted to fill out her own menu, so another menu was faxed for 10/17.

## 2022-10-13 NOTE — Provider Consult (Addendum)
Consulting MD: Terese Door, *     LOS: 0 days     Consult: itchy rash on hands    HPI: [name] is a 31 y.o. female with a PMH of SI, depression, substance use (cocaine, cannabis, alcohol), scabies, PTSD, who presents with pruritus of the hands.    Patient reports that this has been ongoing for months. She also reports an area on her thigh which she declines exam of.     Allergies:  Allergies   Allergen Reactions    Eggs Or Egg-Derived Products Hives    Food Nausea And Vomiting     Collard greens       Medications:  Scheduled Meds:   melatonin  3 mg Oral Daily @ 1800    escitalopram  10 mg Oral Daily    ARIPiprazole  5 mg Oral Daily   Continuous Infusions:  PRN Meds:.albuterol HFA, hydrOXYzine HCl, OLANZapine, aluminum & magnesium hydroxide w/simethicone, magnesium hydroxide, nicotine polacrilex, eucerin, ibuprofen    PMH:   Depression  Substance use  PTSD  Paranoia  scabies    Physical Exam  Skin: The hands and feet were examined; patient declined exam of thighs:   - Oval-shaped erythematous plaques with fissuring in the web spaces of the hand, extending onto the sides of the digits, with collarettes of scale  - Bilateral feet there is interdigital scale and mild erythema  - No burrows noted      Assessment/Recommendations:  This is a case of a 31 y.o. 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 Physical exam is not consistent with a scabies infection today.     Plan  - Start fluconazole 150 mg once weekly for 6 weeks  - Start terbinafine (lamisil) cream twice daily to the scaly affected areas   -Continue this medication until the scaling and itching resolves and for 2 weeks after  - Would follow up closely with weekly EKGs as there is a small risk for QT prolongation due to possible drug-drug interactions.    Recommendations discussed with covering from primary  team.  Patient examined with Dr. Ileene Rubens.    Lorn Junes, MD,PhD   Dermatology Resident  10/13/2022 4:05 PM

## 2022-10-13 NOTE — CPEP Notes (Addendum)
Pt observed to be resting in room H throughout shift. Accepting of scheduled medications. Calm and cooperative. Appears to be in NAD. Menu faxed to food and nutrition for 10/17. Respirations even and unlabored. Encouraged to make needs known to staff. Q15 minute safety checks maintained.

## 2022-10-13 NOTE — CPEP Notes (Signed)
CPEP Triage Note    Arrival    Patient is oriented to unit and CPEP evaluation process: Yes  Reviewed cell phone and visitor policies: yes   Reviewed contraband items/milieu safety concerns and confirmed no safety concerns present: yes    Patient is accompanied by: patient alone  Patient under MHT: No    History and Chief Complaint    Reason for current presentation: Pt is a 31 yr old female with pphx of depression, anxiety and substance abuse presenting voluntary for SI and depression. Pt reports feeling increased depression and SI since stopping her medication and requests to be placed back on medication. Pt endorses SI with plans to starve herself or cut her wrist. Pt reports having access to firearms at home. Pt denies SIB/HI/AVH. Pt denies drug/ETOH use. Pt denies taking daily psych medication. Pt denies connection to mental health services.  Pt contracts for safety. Currently monitoring and offering therapeutic support.     Current Mental Health Provider(s): Pt denies connection to mental health services.    Any recent exposure to infestations(lice, scabies, bedbugs, fleas) or other communicable diseases: No    Substance use: denies     Ingestion: No    Self-harm: no    Medication Data Collection    Medication data collection: Yes: With Patient (Reliable)    Physical Assessment    Pain assessment: Last Nursing documented pain:  0-10 Scale: 2 (10/12/22 2312)    Last Filed Vitals    10/12/22 2312   BP: 132/73   Pulse: 90   Temp: 36.1 C (97 F)   SpO2: 99%       Medical / Surgical History    PMH:   Past Medical History:   Diagnosis Date    Anxiety     Asthma     Depression     Gestational diabetes     Preeclampsia     Scabies     Substance abuse        PSH:   Past Surgical History:   Procedure Laterality Date    CESAREAN SECTION, LOW TRANSVERSE      Left leg surgery      TONSILLECTOMY AND ADENOIDECTOMY         Review of Systems   HENT:  Negative for sneezing and sore throat.    Respiratory:  Negative for  chest tightness and shortness of breath.    Cardiovascular:  Negative for chest pain.   Gastrointestinal:  Negative for nausea and vomiting.   Neurological:  Negative for dizziness and light-headedness.   Psychiatric/Behavioral:  Positive for suicidal ideas. Negative for agitation, behavioral problems, confusion, decreased concentration, dysphoric mood, hallucinations, self-injury and sleep disturbance. The patient is nervous/anxious. The patient is not hyperactive.        Any additional concerns: no    Anticipated track: 3    Beola Cord, RN, 12:11 AM

## 2022-10-13 NOTE — Progress Notes (Signed)
Received an inquiry that this patient was interested in becoming a new patient at [facility].  Attempted to contact the patient, but was unable to leave a message as their voicemail box was full.   Phone #: [phone]
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
## 2022-10-13 NOTE — CPEP Notes (Signed)
Pt seen resting in milieu. Respirations even and unlabored. Pt cooperative with all patient care and remains in good behavioral control. Food and fluids offered. No other issues or concerns. Currently monitoring and offering therapeutic support.

## 2022-10-13 NOTE — CPEP Notes (Signed)
Writer attempted blood draw, unsuccessfully. Provided patient with fluids.

## 2022-10-14 LAB — CBC AND DIFFERENTIAL
Baso # K/uL: 0 10*3/uL (ref 0.0–0.2)
Basophil %: 0.4 %
Eos # K/uL: 0.5 10*3/uL (ref 0.0–0.5)
Eosinophil %: 8.6 %
Hematocrit: 42 % (ref 34–49)
Hemoglobin: 13.7 g/dL (ref 11.2–16.0)
IMM Granulocytes #: 0 10*3/uL (ref 0.0–0.0)
IMM Granulocytes: 0.4 %
Lymph # K/uL: 1.8 10*3/uL (ref 1.0–5.0)
Lymphocyte %: 32.3 %
MCH: 30 pg (ref 26–32)
MCHC: 32 g/dL (ref 32–36)
MCV: 93 fL (ref 75–100)
Mono # K/uL: 0.5 10*3/uL (ref 0.1–1.0)
Monocyte %: 9.2 %
Neut # K/uL: 2.7 10*3/uL (ref 1.5–6.5)
Nucl RBC # K/uL: 0 10*3/uL (ref 0.0–0.0)
Nucl RBC %: 0 /100 WBC (ref 0.0–0.2)
Platelets: 256 10*3/uL (ref 150–450)
RBC: 4.6 MIL/uL (ref 4.0–5.5)
RDW: 14.8 % (ref 0.0–15.0)
Seg Neut %: 49.1 %
WBC: 5.6 10*3/uL (ref 3.5–11.0)

## 2022-10-14 LAB — LIPID PANEL
Chol/HDL Ratio: 1.5
Cholesterol: 137 mg/dL
HDL: 92 mg/dL — ABNORMAL HIGH (ref 40–60)
LDL Calculated: 18 mg/dL
Non HDL Cholesterol: 45 mg/dL
Triglycerides: 133 mg/dL

## 2022-10-14 LAB — COMPREHENSIVE METABOLIC PANEL
ALT: 12 U/L (ref 0–35)
AST: 21 U/L (ref 0–35)
Albumin: 3.9 g/dL (ref 3.5–5.2)
Alk Phos: 91 U/L (ref 35–105)
Anion Gap: 7 (ref 7–16)
Bilirubin,Total: 0.3 mg/dL (ref 0.0–1.2)
CO2: 28 mmol/L (ref 20–28)
Calcium: 9.2 mg/dL (ref 8.8–10.2)
Chloride: 107 mmol/L (ref 96–108)
Creatinine: 1.03 mg/dL — ABNORMAL HIGH (ref 0.51–0.95)
Glucose: 82 mg/dL (ref 60–99)
Lab: 15 mg/dL (ref 6–20)
Potassium: 4.5 mmol/L (ref 3.3–5.1)
Sodium: 142 mmol/L (ref 133–145)
Total Protein: 7.9 g/dL — ABNORMAL HIGH (ref 6.3–7.7)
eGFR BY CREAT: 74 *

## 2022-10-14 LAB — T4, FREE: Free T4: 1 ng/dL (ref 0.9–1.7)

## 2022-10-14 LAB — TSH: TSH: 0.56 u[IU]/mL (ref 0.27–4.20)

## 2022-10-14 MED ORDER — FLUCONAZOLE 150 MG PO TABS *I*
150.0000 mg | ORAL_TABLET | ORAL | Status: DC
Start: 2022-10-20 — End: 2022-10-19

## 2022-10-14 NOTE — CPEP Notes (Signed)
Clinical Evaluator Collateral Note    Professional:   Cristino Martes and Larwance Rote, [employer]   3:49 PM: Writer sent in-basket message requesting collateral information from when pt was enrolled in their program.     [facility], [phone]   3:52 PM: Referral for [facility] was completed but closed on 08/22/22 due to lack of engagement. No [facility] admissions or presentations.    Harriett Rush, Case Manager (?), [phone]   3:54 PM: Writer called and left voicemail requesting call back.     Royce Macadamia, LMSW, 3:49 PM

## 2022-10-14 NOTE — CPEP Notes (Signed)
Pt appeared to have slept through the night without incident or concern. Remained in room H. Requested plentiful amounts of food and beverage. Requested albuterol inhaler and continues to have a cough, requested medication to help sleep. Writer administered PRN hydroxyzine and zydis with good effect. Writer completed EOB admission with the patient. Writer attempted blood draw but was unsuccessful. Pt was agreeable to all nursing interventions and gracious of nursing staff. Very appropriate and respectful with staff. Denied any physical pain. Q15 min safety checks and comfort measures maintained.

## 2022-10-14 NOTE — Plan of Care (Signed)
Problem: Risk for Suicide  Goal: Patient Will Be Safe And Free From Injury Throughout Hospitalization  Description: This goal applies for the duration of hospitalization  Outcome: Maintaining  Goal: Patient Will Verbalize Understanding Of Role Of Medications In Stabilizing Symptoms By Day 3  Outcome: Maintaining  Goal: Patient Will Identify 2 Alternative Ways Of Dealing With Stress And Emotional Problems By Day 3  Description: Patient is to IDENTIFY healthy coping skills.  For example, talking with staff or significant others  Outcome: Maintaining  Goal: Patient Will Use Their Identified Coping Skills By Discharge  Description: Patient is to DEMONSTRATE alternative healthy coping skills  Outcome: Maintaining  Goal: Patient Will Identify Their Behavioral Response To Stress, With Staff Assistance, By Day 3  Outcome: Maintaining  Goal: Patient Will Discuss Signs/Symptoms Of Illness Throughout Hospitalization  Outcome: Maintaining  Goal: Patient Will Express Sense Of Hope And Future Orientation By Discharge  Outcome: Maintaining

## 2022-10-14 NOTE — CPEP Notes (Signed)
RN has attempted to speak with patient several times during the shift. Patient has declined discussion. Patient refused vitals and medications. Patient is alert when awake but sleeps often throughout the shift. Patient has been provided food. Patient is able to make her needs known. Care plan with 15 minute checks in place.

## 2022-10-14 NOTE — Plan of Care (Signed)
Problem: Risk for Suicide  Goal: Patient Will Be Safe And Free From Injury Throughout Hospitalization  Description: This goal applies for the duration of hospitalization  Outcome: Maintaining  Goal: Patient Will Verbalize Understanding Of Role Of Medications In Stabilizing Symptoms By Day 3  Outcome: Maintaining  Goal: Patient Will Identify 2 Alternative Ways Of Dealing With Stress And Emotional Problems By Day 3  Description: Patient is to IDENTIFY healthy coping skills.  For example, talking with staff or significant others  Outcome: Maintaining  Goal: Patient Will Use Their Identified Coping Skills By Discharge  Description: Patient is to DEMONSTRATE alternative healthy coping skills  Outcome: Maintaining  Goal: Patient Will Identify Their Behavioral Response To Stress, With Staff Assistance, By Day 3  Outcome: Maintaining  Goal: Patient Will Discuss Signs/Symptoms Of Illness Throughout Hospitalization  Outcome: Maintaining  Goal: Patient Will Express Sense Of Hope And Future Orientation By Discharge  Outcome: Maintaining     Problem: Risk for Alteration of Physiological Status  Goal: Patient Will Report, Throughout Hospitalization, Relief Of Pain/Discomfort When Utilizing Comfort Measures  Outcome: Maintaining
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
## 2022-10-14 NOTE — CPEP Notes (Signed)
[facility] Provider Progress Note    Patient seen and evaluated by me today, 10/14/2022 at 11:56 AM    Subjective/Patient Concerns:  Pt is sleeping in room H. She rouses, but has to be reminded to sit up and talk with Clinical research associate. She says "I don't know" when asked how her mood is. She expresses no physical complaints today. When asked why she didn't take her medications this morning, she states that they were not brought to her, but will take them if nursing tries again.   Was seen by dermatology yesterday, see consult note. Scabies ruled out.      Interim Events and Behavioral Observations: No acute concerns or events documented or reported. She refused medications this morning. Did take scheduled Fluconazole last night; no PRNs utilized.     Meds:    [START ON 10/20/2022] fluconazole  150 mg Oral Q7 Days    melatonin  3 mg Oral Daily @ 1800    escitalopram  10 mg Oral Daily    ARIPiprazole  5 mg Oral Daily    terbinafine   Topical 2 times per day       PRN's:   PRN Medications   Medication Dose Frequency Last Admin    albuterol HFA (PROVENTIL, VENTOLIN, PROAIR HFA) inhaler 2 puff  2 puff Q6H PRN 2 puff at 10/14/22 0444    hydrOXYzine HCl (ATARAX) tablet 50 mg  50 mg Q6H PRN 50 mg at 10/13/22 2245    OLANZapine (ZyPREXA ZYDIS) disintegrating tablet 5 mg  5 mg Q6H PRN 5 mg at 10/13/22 2245    aluminum & magnesium hydroxide w/simethicone (MAALOX ADVANCED REGULAR) suspension 30 mL  30 mL Q8H PRN      magnesium hydroxide (MILK OF MAGNESIA) 400 MG/5ML suspension 30 mL  30 mL Daily PRN      nicotine polacrilex (NICORETTE) gum 2 mg  2 mg Q2H PRN      ibuprofen (ADVIL,MOTRIN) tablet 600 mg  600 mg Q6H PRN         Most recent Vitals:  Vitals:    10/12/22 2312 10/13/22 0000 10/14/22 1000   BP: 132/73 132/70    BP Location: Left arm Right arm    Pulse: 90 99    Resp:   18   Temp: 36.1 C (97 F) 36.4 C (97.5 F)    TempSrc: Temporal Temporal    SpO2: 99% 98%    Weight: 56.7 kg (125 lb)     Height: 1.702 m (5\' 7" )         [place]  Suicide Severity Rating:  [place] Suicide Severity Rating Scale Screen - Past Month       1. Have you wished you were dead or wished you could go to sleep and not wake up?: Yes       2. Have you actually had any thoughts of killing yourself?: No       3. Have you been thinking about how you might do this?: No       4. Have you had these thoughts and had some intention of acting on them?: No       5. Have you started to work out or worked out the details of how to kill yourself? Do you intend to carry out this plan?: No       6. Have you done anything, started to do anything, or prepared to do anything to end your life?: No       7. Was this within  the past three months?: No  [place] Suicide Severity Rating Scale Risk Calculation: Low    Mental Status Exam:  Mental Status Exam  Appearance: Disheveled (wearing street clothes, which are dirty)  Relationship to Interviewer: Engages well, Guarded  Psychomotor Activity: Normal  Abnormal Movements: None  Muscle Strength and Tone: Normal  Station/Gait : Normal  Speech : Regular rate, Normal tone, Normal rhythm  Language: Fluent  Mood: Dysphoric  Affect: Dysphoric  Thought Process: Goal-directed  Thought Content: No homicidal ideation, No delusions, No obsessions/compulsions, Suicidal ideation  Perceptions/Associations : No hallucinations  Sensorium: Alert  Cognition: Fair attention span  [employer] of Knowledge: Normal  Insight : Poor  Judgement: Poor      Additional data: All labs in the last 72 hours:  Recent Results (from the past 72 hour(s))   Hold SST    Collection Time: 10/12/22  9:21 AM   Result Value Ref Range    Hold SST HOLD TUBE    Hold blue    Collection Time: 10/12/22  9:21 AM   Result Value Ref Range    Hold Blue HOLD TUBE    Hold green with gel    Collection Time: 10/12/22  9:21 AM   Result Value Ref Range    Hold Green (w/gel,spun) HOLD TUBE    Hold lavender    Collection Time: 10/12/22  9:21 AM   Result Value Ref Range    Hold Lav HOLD TUBES    EKG 12 lead  (initial)    Collection Time: 10/12/22  1:22 PM   Result Value Ref Range    Rate 91 bpm    PR 147 ms    P 59 deg    QRSD 88 ms    QT 370 ms    QTc 457 ms    QRS 64 deg    T 90 deg    /   /      Assessment: Alexis Vang  is a 31 y.o.  y/o female  with history unspecified psychosis, SUD (cocaine, ETOH, THC), PTSD, mood disorder,  prior CPEP presentations (most recent 04/11/22) and prior IP psychiatric admissions (most recent 04/12/22-05/14/22), inconsistent engagement with OP treatment, who was admitted on 9.13 status after presenting voluntarily to ED with report of increased SI with plan to jump off a bridge and increased depression in setting of medication noncompliance, active substance use, limited coping skills).   Alexis Vang  was restarted on escitalopram 10 mg QAM and started on Abilify 5 mg QAM. She has declined to take the scheduled Abilify. She continues to present with dysphoric mood, request to stay inpatient for mental health or be transferred for chemical dependency. She requests to learn coping skills from group therapy, as well as take medication while in the hospital. She is not interested in attending outpatient mental health. Little improvement has been seen at this point, it is possible she is still metabolizing substances and we are not seeing a clear, sober picture of her symptoms and their impact at this point. Will continue on 9.13 status in PE[facility] for stabilization, encourage medication compliance and service linkage.       Final diagnoses:   PTSD (post-traumatic stress disorder)   Cocaine use disorder   Depression, unspecified depression type   Psychosis, unspecified psychosis type       Plan:  Legal Status: 9.13      Reasons for Continued Stay: Suicidal/homicidal, Unable to care for self in a less structured environment, and Need for  safe D/C    Author: Jonell Cluck, NP  as of: 10/14/2022  at: 11:56 AM       Vernia Teem L, NP  10/14/22 1205

## 2022-10-15 LAB — HEMOGLOBIN A1C: Hemoglobin A1C: 4.9 %

## 2022-10-15 NOTE — CPEP Notes (Signed)
[name] has been maintaining in room H. Her mood has been irritable.  Is able to engage to make her needs known.  Tolerating food and fluids without issue. 15 minute observational checks maintained.

## 2022-10-15 NOTE — CPEP Notes (Signed)
Pt has been calm and cooperative. She continues to be isolative to her room. She does not interact with peers. She has been compliant with her medications. She denies any SI/HI/AVH.

## 2022-10-15 NOTE — CPEP Notes (Signed)
[facility] Provider Progress Note    Patient seen and evaluated by me today, 10/15/2022 at 9:33 AM    Subjective/Patient Concerns:  Today Alexis Vang is sitting up in her room and she is eating breakfast. Her affect is still blunted, and she continues to report SI. She is still refusing the scheduled Abilify. She states she does not feel safe to go home yet. She is unsure if she would like to go to inpatient chemical dependency. She denies HI/AVH. She reports the cream has been somewhat helpful with the itching on her hands.      Interim Events and Behavioral Observations: No acute concerns or events documented or reported. Compliant with scheduled medications. No PRNs utilized.        Meds:    [START ON 10/20/2022] fluconazole  150 mg Oral Q7 Days    melatonin  3 mg Oral Daily @ 1800    escitalopram  10 mg Oral Daily    ARIPiprazole  5 mg Oral Daily    terbinafine   Topical 2 times per day       PRN's:   PRN Medications   Medication Dose Frequency Last Admin    albuterol HFA (PROVENTIL, VENTOLIN, PROAIR HFA) inhaler 2 puff  2 puff Q6H PRN 2 puff at 10/14/22 0444    hydrOXYzine HCl (ATARAX) tablet 50 mg  50 mg Q6H PRN 50 mg at 10/13/22 2245    OLANZapine (ZyPREXA ZYDIS) disintegrating tablet 5 mg  5 mg Q6H PRN 5 mg at 10/13/22 2245    aluminum & magnesium hydroxide w/simethicone (MAALOX ADVANCED REGULAR) suspension 30 mL  30 mL Q8H PRN      magnesium hydroxide (MILK OF MAGNESIA) 400 MG/5ML suspension 30 mL  30 mL Daily PRN      nicotine polacrilex (NICORETTE) gum 2 mg  2 mg Q2H PRN      ibuprofen (ADVIL,MOTRIN) tablet 600 mg  600 mg Q6H PRN         Most recent Vitals:  Vitals:    10/12/22 2312 10/13/22 0000 10/14/22 1000 10/15/22 0000   BP: 132/73 132/70     BP Location: Left arm Right arm     Pulse: 90 99     Resp:   18 18   Temp: 36.1 C (97 F) 36.4 C (97.5 F)     TempSrc: Temporal Temporal     SpO2: 99% 98%     Weight: 56.7 kg (125 lb)      Height: 1.702 m (5\' 7" )          [place] Suicide Severity Rating:  [place]  Suicide Severity Rating Scale Screen - Past Month       1. Have you wished you were dead or wished you could go to sleep and not wake up?: Yes       2. Have you actually had any thoughts of killing yourself?: No       3. Have you been thinking about how you might do this?: No       4. Have you had these thoughts and had some intention of acting on them?: No       5. Have you started to work out or worked out the details of how to kill yourself? Do you intend to carry out this plan?: No       6. Have you done anything, started to do anything, or prepared to do anything to end your life?: No       7. Was  this within the past three months?: No  [place] Suicide Severity Rating Scale Risk Calculation: Low    Mental Status Exam:  Mental Status Exam  Appearance: Disheveled (wearing street clothes, which are dirty)  Relationship to Interviewer: Engages well, Guarded  Psychomotor Activity: Normal  Abnormal Movements: None  Muscle Strength and Tone: Normal  Station/Gait : Normal  Speech : Regular rate, Normal tone, Normal rhythm  Language: Fluent  Mood: Dysphoric  Affect: Dysphoric  Thought Process: Goal-directed  Thought Content: No homicidal ideation, No delusions, No obsessions/compulsions, Suicidal ideation  Perceptions/Associations : No hallucinations  Sensorium: Alert  Cognition: Fair attention span  [employer] of Knowledge: Normal  Insight : Poor  Judgement: Poor      Additional data: All labs in the last 72 hours:  Recent Results (from the past 72 hour(s))   EKG 12 lead (initial)    Collection Time: 10/12/22  1:22 PM   Result Value Ref Range    Rate 91 bpm    PR 147 ms    P 59 deg    QRSD 88 ms    QT 370 ms    QTc 457 ms    QRS 64 deg    T 90 deg   Comprehensive metabolic panel    Collection Time: 10/14/22 12:32 PM   Result Value Ref Range    Sodium 142 133 - 145 mmol/L    Potassium 4.5 3.3 - 5.1 mmol/L    Chloride 107 96 - 108 mmol/L    CO2 28 20 - 28 mmol/L    Anion Gap 7 7 - 16    UN 15 6 - 20 mg/dL    Creatinine 1.61  (H) 0.51 - 0.95 mg/dL    eGFR BY CREAT 74 *    Glucose 82 60 - 99 mg/dL    Calcium 9.2 8.8 - 09.6 mg/dL    Total Protein 7.9 (H) 6.3 - 7.7 g/dL    Albumin 3.9 3.5 - 5.2 g/dL    Bilirubin,Total 0.3 0.0 - 1.2 mg/dL    AST 21 0 - 35 U/L    ALT 12 0 - 35 U/L    Alk Phos 91 35 - 105 U/L   Lipid Panel (Reflex to Direct  LDL if Triglycerides more than 400)    Collection Time: 10/14/22 12:32 PM   Result Value Ref Range    Cholesterol 137 mg/dL    Triglycerides 045 mg/dL    HDL 92 (H) 40 - 60 mg/dL    LDL Calculated 18 mg/dL    Non HDL Cholesterol 45 mg/dL    Chol/HDL Ratio 1.5    CBC and differential    Collection Time: 10/14/22 12:32 PM   Result Value Ref Range    WBC 5.6 3.5 - 11.0 THOU/uL    RBC 4.6 4.0 - 5.5 MIL/uL    Hemoglobin 13.7 11.2 - 16.0 g/dL    Hematocrit 42 34 - 49 %    MCV 93 75 - 100 fL    MCH 30 26 - 32 pg    MCHC 32 32 - 36 g/dL    RDW 40.9 0.0 - 81.1 %    Platelets 256 150 - 450 THOU/uL    Seg Neut % 49.1 %    Lymphocyte % 32.3 %    Monocyte % 9.2 %    Eosinophil % 8.6 %    Basophil % 0.4 %    Neut # K/uL 2.7 1.5 - 6.5 THOU/uL    Lymph #  K/uL 1.8 1.0 - 5.0 THOU/uL    Mono # K/uL 0.5 0.1 - 1.0 THOU/uL    Eos # K/uL 0.5 0.0 - 0.5 THOU/uL    Baso # K/uL 0.0 0.0 - 0.2 THOU/uL    Nucl RBC % 0.0 0.0 - 0.2 /100 WBC    Nucl RBC # K/uL 0.0 0.0 - 0.0 THOU/uL    IMM Granulocytes # 0.0 0.0 - 0.0 THOU/uL    IMM Granulocytes 0.4 %   TSH    Collection Time: 10/14/22 12:32 PM   Result Value Ref Range    TSH 0.56 0.27 - 4.20 uIU/mL   T4, free    Collection Time: 10/14/22 12:32 PM   Result Value Ref Range    Free T4 1.0 0.9 - 1.7 ng/dL    /   /      Assessment: Alexis Vang  is a 31 y.o.  y/o female  with history unspecified psychosis, SUD (cocaine, ETOH, THC), PTSD, mood disorder,  prior CPEP presentations (most recent 04/11/22) and prior IP psychiatric admissions (most recent 04/12/22-05/14/22), inconsistent engagement with OP treatment, who was admitted on 9.13 status after presenting voluntarily to ED with report of  increased SI with plan to jump off a bridge and increased depression in setting of medication noncompliance, active substance use, limited coping skills).     Alexis Vang was admitted to [facility] for stabilization, medication management and service connection. While in CPEP, she was cooperative and maintained good behavioral control and did not require the use of restraints.     There was concern for scabies, as Alexis Vang presented with an apparnt history of scabies infection and an itchy rash on her right hand, as well as her feet. Dermatology was consulted, scabies ruled out. Rash is most likely a fungus combined with a yeast infection, medications ordered per their recommendation. She reports some relief from the cream ordered.     Alexis Vang was restarted on previously used Lexapro 10 mg Qdaily per her request. Abilfy 5  mg Qdaily was also added for treatment of paranoia and symptoms of psychosis; additionally to augment the Lexapro for treatment of depressive symptoms. Chart review indicates that Alexis Vang has requested Lexapro to be increased to 20 mg, however she displayed symptoms of mania, so the goal was to target her depressive symptoms without increasing the chance of mania. Medication education was provided each day about the Abilify.     Today, Alexis Vang continues to present with dysphoric mood and suicidal ideation. She does not feel ready to discharge, and would prefer to stay in the hospital for full inpatient psychiatry treatment. Her [facility] stay will be ended and she will continue on a 9.13 status.     Final diagnoses:   PTSD (post-traumatic stress disorder)   Cocaine use disorder   Depression, unspecified depression type   Psychosis, unspecified psychosis type       Plan:  Legal Status: 9.13; continue to board for 9.13 inpatient psychiatry admission      Reasons for Continued Stay: Suicidal/homicidal, Unable to care for self in a less structured environment, Stabilization of gains, and Need for safe D/C    Author:  Jonell Cluck, NP  as of: 10/15/2022  at: 9:33 AM       Ming Kunka L, NP  10/15/22 1013

## 2022-10-15 NOTE — Plan of Care (Signed)
Problem: Risk for Suicide  Goal: Patient Will Be Safe And Free From Injury Throughout Hospitalization  Description: This goal applies for the duration of hospitalization  Outcome: Maintaining  Goal: Patient Will Verbalize Understanding Of Role Of Medications In Stabilizing Symptoms By Day 3  Outcome: Maintaining  Goal: Patient Will Identify 2 Alternative Ways Of Dealing With Stress And Emotional Problems By Day 3  Description: Patient is to IDENTIFY healthy coping skills.  For example, talking with staff or significant others  Outcome: Maintaining  Goal: Patient Will Use Their Identified Coping Skills By Discharge  Description: Patient is to DEMONSTRATE alternative healthy coping skills  Outcome: Maintaining  Goal: Patient Will Identify Their Behavioral Response To Stress, With Staff Assistance, By Day 3  Outcome: Maintaining  Goal: Patient Will Discuss Signs/Symptoms Of Illness Throughout Hospitalization  Outcome: Maintaining  Goal: Patient Will Express Sense Of Hope And Future Orientation By Discharge  Outcome: Maintaining     Problem: Risk for Alteration of Physiological Status  Goal: Patient Will Report, Throughout Hospitalization, Relief Of Pain/Discomfort When Utilizing Comfort Measures  Outcome: Maintaining

## 2022-10-16 LAB — PREGNANCY, URINE: Preg Test,UR: NEGATIVE

## 2022-10-16 LAB — DRUG SCREEN CHEMICAL DEPENDENCY, URINE
Amphetamine,UR: NEGATIVE
Benzodiazepinen,UR: NEGATIVE
Cocaine/Metab,UR: POSITIVE
Fentanyl, UR: NEGATIVE ng/mL
Opiates,UR: NEGATIVE
THC Metabolite,UR: NEGATIVE

## 2022-10-16 NOTE — CPEP Notes (Signed)
Shift 8pm-8am. Patient able to make needs known, seen resting in room. No behavioral outbursts noted/reported. Denies pain. Q 15 min safety checks performed and maintained.

## 2022-10-16 NOTE — CPEP Notes (Signed)
Writer asked patient to move out of room H into adult milieu. Pt was originally given room d/t contact precautions, which have since been discontinued. Writer explained the room was needed for evaluations, pt responded "but I don't want to be around other people". With much staff encouragement, pt displeased but agreeable to moving into flex space but brought couch from room.
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
## 2022-10-16 NOTE — CPEP Notes (Signed)
Pt is alert and oriented. Pt is able to make needs known. Pt is med and meal compliant. Patient will not engage in mental health discussion. Patient gives very short answers. Patient remains isolated in room, appears paranoid when she is out in the milieu to get her needs met. Care plan in place with 15 minute checks.

## 2022-10-17 NOTE — CPEP Notes (Signed)
6:00 PM  [place], [name]  Writer met with [name], she was lying down but was able to engage with the Clinical research associate. [name] was interested to know the writers' nationality. [name] shared with the Clinical research associate that she has a sister named [name]. She stated, "isn't that funny? I am named after a country and my sister after a continent" The Clinical research associate asked [name] what brought her to the hospital.  [name] reported that she "has been going through a lot. My kids  my life, its a lot of chaos right now" The writer asked [name] what he could do for her to make things better. She replied, "if only I could find a quiet place to rest"

## 2022-10-17 NOTE — CPEP Notes (Signed)
Pt accepting of shower and clean scrubs at this time

## 2022-10-17 NOTE — CPEP Notes (Addendum)
Shift 8pm-8am. Patient able to make needs known, seen resting in milieu. No behavioral outbursts noted/reported. Engages appropriately with Clinical research associate when requesting prns. C/o of pain in lungs due to coughing/asthma. Q 15 min safety checks performed and maintained.

## 2022-10-17 NOTE — CPEP Notes (Signed)
Patient is alert and oriented. Patient is able to make her needs known. Patient often comes up to the nursing window to make requests. Patient refused Abilify but took her other medications. Patient endorses suicidal ideation but no stated plan. Patient does make statements that don't make complete sense when you ask her questions so at times the questions need repeating to get a logical answer. Patient denies HI/AVH. Patient care plan with 15 minute checks in place.
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
## 2022-10-18 NOTE — CPEP Notes (Signed)
Patient resting in the flex space this shift. Patient continues to refuse most medication.  Patient is eating and drinking adequate amounts. Patient has been in good behavioral control this shift. Patient is able to make her needs known and offers no issues at this time. Q 15 minute checks continue for safety.

## 2022-10-18 NOTE — CPEP Notes (Signed)
Shift 8pm-8am. Patient able to make needs known, seen laying down resting in mileu. No behavioral outburst noted/reported at this time. Denies pain. Q 15 min safety checks performed and maintained.

## 2022-10-18 NOTE — CPEP Notes (Signed)
Pt refusing Abilify. Writer asking for clarification for refusing, pt states "I don't take ability."

## 2022-10-19 ENCOUNTER — Other Ambulatory Visit: Payer: Self-pay

## 2022-10-19 MED ORDER — ESCITALOPRAM OXALATE 10 MG PO TABS *I*
10.0000 mg | ORAL_TABLET | Freq: Every day | ORAL | 0 refills | Status: DC
Start: 2022-10-19 — End: 2024-11-07
  Filled 2022-10-19: qty 15, 15d supply, fill #0
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
## 2022-10-19 NOTE — Discharge Instructions (Addendum)
CPEP Discharge Instructions    Discharge Date: 10/19/2022        Upon discharge from [facility] it is recommended  you follow up with mental health and substance use treatment. You have declined any direct referrals and appointments, however below are resources in the community, if you should change your mind:      Follow-ups:  Appointment: Behavioral Health Access and Crisis Center  Date: 10/21/2022  Time: 11:00am  Location: [address] [place], [place] [address]  Phone: [phone]        Behavioral Health Access and Crisis Center    CONNECTIONS WHEN LIFE GETS HARD  Mental Health.  Substance Use or Abuse.  Crisis Intervention.    [facility]'s Behavioral Health Access and Crisis Center provides immediate crisis intervention, assessment, supportive counseling, peer services, care management, and follow-up services for individuals and families facing various types of crisis. Our licensed clinicians are always available to help in dealing with various situations including, but not limited to:   Thoughts of self-harm   Relationship problems   Depression and anxiety   Substance use concerns   Problems following up with mental health & care providers    Our behavioral health services are available to those 18 years and older. They completely voluntary, and available on a walk-in basis--no referral is needed. Individuals will experience a comfortable, relaxed environment, without the wait of an ER, and are free to come and go as they please.    Open Monday - Friday from 8 am - Midnight  No appointments necessary, walk-ins welcome from 8am - 10pm    For more information please call:  [phone]    For walk-ins please visit:  [facility] Access  and [facility]  [address]  [place], [place] [address]    [email or web address]         [place] [facility]      [facility] Psychiatric Diversion Program is a division of [facility] [place]. We are a [employer], staffed by our own Certified Peer Support Specialists. Our Peer Support staff is ready to help you. They are experienced in:    advocacy    group development    recognizing and embracing diverse teams    facilitation of peer-to-peer groups and activities    coordinating services, community resources and natural supports    supporting and developing the strengths of each individual, youth, and family      Contact us at   [employer]   [address]   Building [place], [place] [address]   3pm - 11pm   7 days per week   [email or web address]   [email or web address]          [facility]  A [facility] Diversion Service     [facility] WARM LINE SERVICE [phone]  If you are feeling like you need to talk to someone who understands what you are going through, please call our warm line. We are here to help. The [facility] Warm Line service is staffed by Peer Support Specialists who understand that at times, it can be easier to talk to individuals who have had similar experiences. The warm line, which is operated 24 hours per day and 7 days per week, is [phone]. Call us today.     REGISTRATION LINE  [phone]  [email or web address]   [facility] [address]  [place], [place] [address]  Serving [place], [place], [place],  [place], [place] and [address], 24 hours per day and 7 days per week    [facility] is the result of a partnership between [facility] and the [employer] of [place]. This program is funded through the [facility] of [employer] in coordination with the [employer] of Mental Health. [employer] is a private, non-profit rehabilitation agency serving individuals with persistent mental illness and/or substance use disorder with the goal of helping individuals to live independent and fulfilling lives. Since 1966, [facility] has been offering personalized care that meets each person's needs while advocating for  the better understanding and acceptance of mental health and substance use issues in the community. Through our 11 locations, we continue to move lives forward each day. For more information, visit [email or web address].     Need Help?   Do you feel like you are nearing a crisis and are in recovery from mental illness?   Would you like some extra help refining the skills you need to cope?  THEN, [facility] IS FOR YOU.    How do I get access to [facility]?  YOU MAY UTILIZE [facility] by calling [phone] and voluntarily referring yourself. When you call, you will complete a brief telephone pre-registration. Once you are pre-registered and if there is an opening in the house, you will be offered a short-term stay.     What would make me eligible for respite at [facility]?  To be eligible for [facility] services, you must:   Be 18 years or older   Have a diagnosis of a serious mental illness (you may also have a co-occurring substance use or physical health condition), and be experiencing a psychiatric crisis   Benefit from a 3- to 5-day diversion with medical and behavioral health conditions sufficiently managed   Have permanent housing   Be able to maintain acceptable personal hygiene   Be able to prepare meals, clean up after yourself, and administer your own medications   Complete a voluntary self-referral and accept the terms of registration   Be willing to follow the conditions of the guest agreement upon entering [facility]   Be a resident of [place], [place], [place], [place], [place] or [facility]    What would make an individual ineligible for [employer]?  Individuals ineligible for services:   Pose an immediate threat to themselves or others, requiring a higher level of care   Are registered sex offenders   Are engaged in an illegal activity   Display symptoms of active engagement in substance use manifested in physical dependence or aggressive and destructive  behavior   Are not willing or able to respect and follow the guest agreement during his or her stay   Have utilized [facility] for more than 21 days per calendar year    [facility] provides a peer-based, recovery-oriented alternative to existing intensive and costly acute crisis services. As an Electronics engineer, you must be able to benefit from a short-term diversion program and have medical and behavioral health conditions sufficiently managed. Your average length of stay at [facility] will be 3 to 5 days and then you will have a Peer Support Specialist follow up on your progress for up to 30 or 60 days. [facility] is an [employer] administered in collaboration with the Mental Health Association of  ([place]).     [facility] IS A NO FEE SERVICE. The site, which is staffed 24  hours per day, has 8 single bedrooms, a common kitchen area, bathrooms, a community living area, laundry facilities, and staff office. The property is well-served by public transportation and easily accessible to other amenities. All [facility] guests are responsible for bringing their own food and personal hygiene items to the site.     AS AN [facility] GUEST, you can come and go as you please. If you need to go to work, appointments or other scheduled meetings, you may do so. All [facility] staff are peers who identify as living with a mental illness who provide guidance, mentoring, as well as support to others who are experiencing crisis. In other words, we are here to help.  What services are provided at [facility]?    Our menu of services includes:   Recovery support groups   One-on-one coaching   Exercise groups   Yoga   Meditation   Musical groups including drumming   Arts and crafts group   WRAP planning group   Community outing     Calm Harm (Free Phone App for I-Phones or Androids)   The urge to self-harm is like a wave. Learn to ride the wave with the free Calm Harm app by choosing  activities from these categories:  Comfort, Distract, Express Yourself, Release, and Random.    There is also a breathing exercise to help you be mindful and stay in the moment, calm difficult emotions, and reduce tension.  When you ride the wave, the urge to self-harm will fade.    Calm Harm is an IT trainer developed for teenage mental health charity stemmed by Clinical Psychologist Dr. Wille Glaser, in collaboration with young people, using principles from the evidence-based Dialectical Behavior Therapy (DBT).    The Calm Harm app provides some immediate activities and techniques to help you break the cycle of self-harm behavior. It does this by exploring underlying trigger factors; the app supports you in building a safety net' of helpful thoughts, behaviors, and access to supportive people. The app is private, anonymous, and safe.    Please note that the Calm Harm app is not a substitute for the assessment and individualized treatment by a health or mental health professional. This app is for people over the age of 31 and for adults.         [employer] Stationers Box (Free Phone App for [employer] or Androids)  [employer] Box is a Film/video editor app designed for individuals struggling with depression. The four main features of Virtual Hope Box include sections for distraction, inspiration, relaxation, and coping skill tools. The distraction techniques include games that require focus, like Sudoku and word puzzles. The relaxation techniques offer a variety of guided and self-controlled meditation exercises. The coping techniques offer suggestions for activities that reduce stress. The inspiration section offers brief quotes to improve mood and motivation. The app can be used in collaboration with a mental health provider through the "coping cards" feature, which can be tailored to address specific problem areas. The relaxation tools can also be used with a clinical professional.   App is appropriate for  older adolescents, young adults and adults.           [facility] Ambulatory Crisis Call Line  #[phone]   Hours of Operation: 24/7  When you call the crisis line, you will speak with a Master's level trained counselor who is experienced in crisis management.  The counselor will help you talk through the problem and suggest  possible solutions.  Your call line counselor will also give you information about other local services and resources that can help with your concerns.For residents of [place], [facility] provides Mobile Crisis Unit that your counselor will contact if necessary.       Chemical Dependency Resources     How can I tell if I have an alcohol problem?    Alcohol may be harmful for you if it causes a problem in any part of your life. Following are signs that you may have a drinking problem or are alcohol dependent.      Blacking out or forgetting where you were or what you were doing    Drinking to get drunk. Or, feeling like you need to drink more to get the same feeling or "buzz"    Drinking to decrease pain or stress    Drinking more than you had expected to drink    Drinking in a pattern. For example, every day or every week at the same time    Suffering from the effects of alcohol on your daily function    Drinking starts to take over and causes problems in your daily life, such as not showing up for work or driving when you are drunk    Trying to hide how much you drink   Feeling guilty or angry when someone says something about your drinking    Seeing or hearing things that are not there (hallucinating)    Having major personality changes when you drink    Planning activities around drinking    Experiencing seizures (convulsions)    Shaking of your hands if you have not had a drink for a while    Sleeping problems or bad dreams    Sweating, nervousness, confusion, or depression    Thinking a lot about drinking    Trouble having erections     What's the harm?    Not all  drinking is harmful. You may have heard that regular light to moderate drinking (from  drink a day up to 1 drink a day for women and 2 for men) can even be good for the heart. With at-risk or heavy drinking, however, any potential benefits are outweighed by greater risks.    Injuries. Drinking too much increases your chances of being injured or even killed.  Alcohol is a factor, for example, in about 60% of fatal burn injuries, drownings, and homicides; 50% of severe trauma injuries and sexual assaults; and 40% of fatal motor vehicle crashes, suicides, and fatal falls.    Health problems. Heavy drinkers have a greater risk of liver disease, heart disease, sleep disorders, depression, stroke, bleeding from the stomach, sexually transmitted infections from unsafe sex, and several types of cancer. They may also have problems managing diabetes, high blood pressure, and other conditions.    Birth defects. Drinking during pregnancy can cause brain damage and other serious problems in the baby. Because it is not yet known whether any amount of alcohol is safe for a developing baby, women who are pregnant or may become pregnant should not drink.    Alcohol use disorders. Generally known as alcoholism and alcohol abuse, alcohol use disorders are medical conditions that doctors can diagnose when a patient's drinking causes distress or harm. In the [place], about 18 million people have an alcohol use disorder.       Wellness Hints:    Be honest and open about your alcohol use with your family, close friends,  and health care professionals. Ask for and accept their help.  Avoid persons who use and/or abuse alcohol and who try to get you to drink alcohol.   Get the help of a counselor and keep regular appointments.   Eat a normal, well-balanced diet, drink six to eight glasses of water a day, and get plenty of rest.   Replace social activities associated with drinking alcohol with those that do not involve alcohol.    Consider cutting down on the amount of alcohol you drink and how often you drink it.     OPEN ACCESS CENTER  [address] [place] [address] [phone] OPEN 24 hours/ 7 days week (Spanish Speaking available)  (Se habla Espaol! Proveemos Asesoramiento y [employer]. Referidos para programas mdicos de Detoxificacin, internado (inpatient) y programas Ambulatorios (outpatient). )  [address] [place] [address] [phone] OPEN 7AM to 10PM/ 7 days week (closed for major holidays)  *both locations serve [place], [place], [place], [place], [place], [place], [place], [place] and [place]  Providing help with opioid, alcohol and other substances. Assessments, evaluations and referrals to detox, inpatient and/or outpatient services.    MOBIL TREATMENT OPTIONS:  [facility] Treatment Unit   Operated by [employer] of Alcoholism & Substance Abuse (GCASA)   *hours vary (p) [phone]   [employer] & [place] [facility] Mobile 24/7 Opiod Response Team   [facility] (FLACRA) (p) [phone]     DETOX FACILITIES:  [place]  [address] [place] [address] ([phone]   [facility]  [address] [place] [address] (6th floor) [place] [address] [phone]   FLACRA [facility] Alcohol Crisis Center   [address] [place] [address]  *Medicaid Managed Care and Private Ins. Only  (p) [phone]   [facility], Alcohol Only)  [address] [place] [address] [phone]   [facility] - Outpatient Only)  [address] [place] [address]  *Walk in Hours M-F 8:30AM  (p) [phone]   [facility] [place] [facility] Health)  [address] [place] [address]  [email or web address] Sabrina Howland (intake)  (p) [phone] ext [phone]     [facility]   (Opiates only with Suboxone)  [address] [facility] [place]  [address]  [email or web address] (p) [phone] or [phone]   [facility] Recovery  [address] [place] [address] [address]  [email or web address] (online referral available)  (p) [phone] (toll free)  (p) [phone]     INPATIENT PROGRAMS:  [facility]   [address] [place] [address] [phone]   [facility] [address] -   [address] [place] [place] [address] ([phone] Ext [phone] or   [phone] Ext [phone]     [facility]   [address] [place] [address] ([phone]   [facility]   [address] [place] [address] [address]) (p) [phone]   [facility]  [address] [place] [address] [phone]   [facility]  [address] [place] [address] [phone] option 3   [facility]  [address] [place] [address] [phone] option 4   [facility]  [address] [place] [address] ([phone]   [facility] - (2 Locations)   [facility]   [address] [address]  *Walk in Hours 7 days a week 9am-11am    [facility]   [address] [place] [address] (p) [phone] ext. [phone] (self-referral line)        (p) [phone]   [facility])  [address] [place] [address] [phone]   [facility]  [address] [place] [address] (direct phone# [phone])  Application online: [email or web address] (p) [phone]    [facility]  [address] [place] [address] [phone]   [facility]  [address] [facility] [place] [phone]   [facility]  [address] [place] [address] [phone]   [facility]   [address] [place] [address] (located on [employer] campus) (p) [phone]   [facility]  [address] [place] [address] ([phone]     [facility]   [address]  [place] [address] [phone]   [facility])  [address], [place] [address]  [email or web address] [place]ie [place] (intake)  (p) [phone] ext [phone]     [facility]   [address] [place] [address] ([phone]   [facility]  [address] [place]) [place] [address] [phone]   Holiday representative [facility])   [address] [place] [address] [phone] ext [address] [facility]  [address] [place] [address] [phone]   [facility] (2 Locations)   [facility]   [address] [place] [address]    [facility])   [address] [place] [address] [phone]      [phone]   [facility]   [address] [place] [address]  [phone]   [facility] Inpatient Chemical Dependency Treatment Center   [address] [place] [address] [phone]   [facility] Recovery  [address] [place] [address]  www.[email or web address] (online referral available) (p) [phone] (toll free)  (p) [phone]   [facility]   [address] [place] [address]  www.[email or web address] (p) [phone]  (f) [phone]   [facility] (No Medicaid/Medicare)   [address] [place] [address] [phone]     OUTPATIENT PROGRAMS:  [facility]  [address] [place] [address]  [email or web address]  *Walk in Hours Monday & Wednesday 8:30am-Noon and T &TH 3pm-5pm at   [address] [phone]  Ext. [phone]   [facility] Comprehensive Alcoholism  [address] [place] [address] [phone]   [place]  [address] [place] [address] [phone] Ext [address] (3  Clinics)   [address]  [place] [address]    [address] [place] [address]   [address]  [place] [address]  *Do not accept Medicare Ins. (p) [phone] ([place])  (p) [phone] ([place])  (p) [phone] [place])   [facility]  [address] [place] [address] ([phone]   [facility] (Restart)  [address]  [place] [address]  *Walk in hours Monday, Wednesday, Friday 1pm-2:30pm at [address] [place] ([place]   [facility] Outpatient Chemical Dependency  [address] [place] [address] [address]) (p) [phone]     [facility]  [address] [place] [address]  [email or web address] (p) [phone]   [facility] (Monday- Thursday)  [address] [place] [address]  www.[email or web address]  *Walk in hours Monday - Thursday 8:00am- 4:00pm (p) [phone]   [facility]   [address] [place] [place] [address] ([phone]   [facility] on Alcohol & Substance Abuse (GCASA)  [address], [place], [place] [address]  5 [address] [place], [place] [address]  [address] [place], [place] [address] ([phone] ([address])  (p) [phone] Jerolyn Shin)  (p) [phone] Veva Holes)   Huther Doyle  [address] [place] [address]  [email or web address]  *Walk in Hours Monday - Friday 8:30am, 10:00am, 12:30pm & 2:00pm (p) [phone]   Melissa Noon (Hispanic Outreach) - [facility] Restart  [address] [place] [address] [phone]   [facility])  [address] [place] [address] [phone]   [facility]   [address] [place] [address] (direct phone [phone])  [address] [place] [address]  (direct phone [phone])  [address] [place] [address] Jenelle Mages) (direct phone [phone])  [address] [place] [address] [facility]) (direct phone [phone])  BuyingShow.es  *Walk in Hours Monday - Friday  8:00am - 3:00pm at all sites (p)  [phone]  (indicate location when calling main intake line)   [facility]  [address] [place] [address]  [email or web address] (p) [phone]   [facility]   [address] [place] [address]  [email or web address]  *Walk in Hours Monday - Wednesday 8:00am - 9:30am   *Phone Intake Monday - Friday 8:00am - 4:00pm (p) [phone]   (f) [phone]   [facility]  [address])  [place] [address]  [email or web address] (p) [phone]     RESIDENTIAL/SUPPORTIVE LIVING FACILITES:  [facility] (Restart)  [address] [place] [address] [phone]     [facility] Recovery Services (CARS)   [address] [place] [address] [phone]   [facility]   [address] [place] [address]  www.[email or web address] (p) [phone]     [facility] (2 Locations)           [facility]          [address] [address] [place] [address]            [facility]          [address] [place] [address]  *Medicaid Managed Care and Private Ins. Only  (p) [phone] [employer])    (p) [phone] [facility])    [employer] - Supportive Living   Locations in [place], [place], [place], [place] and [place]   *Apply through email address: [email or web address] No Number Listed  Use email address to apply    [employer]   [address] [place] [address] [phone]   [facility]   [address] [place] [address]  www.helio.health (p) [phone]     [facility]  [address] [place] [address]  BuyingShow.es (p) [phone]       [facility]   [address] [place] [address] [phone]   [facility] Supportive Living   [address] [place] [address]  www.ywca.org (p) [phone]       [facility] ADMINISTRATION:  [facility]  [address]  [place] [address] [phone]     [place]  [address] [place] [address] [phone]     [place]  [address] [place] [address] [phone]     [place]  [address] [place] [address] [phone]   [facility]  [address] [place] [address]  *only offers intake appointment if veteran is unable to be seen at another facility.  (p) [phone]   [facility]   [address] [place] [address] ([phone]     Resources for Drug and/or Alcohol Treatment:  https://www.barber.com/  https://ncadd-ra.org/    ADDITIONAL RESOURCES:  Al-Anon/Al-Teen (http://www.al-anon.alateen.org/)  [phone]   Alcohol Anonymous (AA)  (RentalRefinancing.at)   [place]. [phone]  [place]: [phone]  Phone Meetings: [phone]   Cocaine Anonymous (CA) (SatelliteStock.ch)  657-086-6399   Heroin Anonymous (HA) (http://www.heroinanonymous.org/)     Marijuana Anonymous (MA) (http://www.marijuana-anonymous,org/)  214-602-7873   Drug and Alcohol Council [phone]   Life Line 587-806-6489 or [address] on Alcohol & Substance Abuse Lenor Derrick) [phone]   Narcotics Anonymous [place] 24-hour Hotline  (http://rochesterny-na.org/)   (http://flana.net)    [place]: [phone]  [place]: [phone]   Nicotine Anonymous (NicA) (http://www.nicotine-anonymous.org/)  226-408-5693    Office of Alcoholism & Substance Abuse Services (https://www.oasas.https://fisher-stokes.com/)  267-375-2809   [facility]  [address] [place], [place] [address] [phone]  M-F 6am-3pm   [facility] (http://www.refugerecovery.org/)  [place]80@gmail .com   [facility] (http://www.rocoveryfitness.org/)   [address] [address] [place], [place] [address] [phone]  mail@rocoveryfitness .[facility]  [address] [place] [address] [phone]        The clinical team recommends  the following actions for creating a safe environment post discharge utilize options as identified in completed safety plan , reach out to family and/or friends for support , remove any weapons or firearms from home, remove access to sharp objects, remove illicit substances and/or alcohol from home , take medications as prescribed , remove old medications no longer prescribed , and maintain access to a Narcan kit    Level of outreach indicated if patient fails new intake or COPS (Comprehensive Outpatient Psychiatric Service) appointment: Routine Program Follow-up    When to call for help:    Call your psychiatric outpatient provider if experiencing any of these symptoms: increased irritability, sleep changes, appetite changes, energy changes, thoughts to harm yourself or others, anxiety, fear, auditory or visual hallucinations.  Lifeline Helpline (24 hours/7 days) [phone] (TTY)  Mobile Crisis team: [phone]              The above information has been discussed with me and I have received a copy.  I understand that I am advised to follow the instructions given to me to appropriately care for my condition.

## 2022-10-19 NOTE — CPEP Notes (Signed)
[facility] SOCIAL WORK  PHARMACY FORM     Today's date:  October 19, 2022    Patient Name: Alexis Vang      Medical Record #: [id number]   DOB: 19-Sep-1991  Patient's Address: [address]               Social Worker: Feliberto Harts, LMSW       Date of Service: October 19, 2022       Funding Source: SW Medication Assistance Fund  ___________________________________________________________________    Pharmacy Information:  Date/time sent: October 19, 2022     Time needed: ASAP    Patient Location: ED    Medication Pick-up Preference: Patient will pick up at the pharmacy      Social work signature: Feliberto Harts, LMSW  Supervisor/Manager Approval (if indicated):  Date:  (supervisor signature not required for Medicaid pending)

## 2022-10-19 NOTE — CPEP Notes (Incomplete)
D/C Note    Harriett Rush: [phone]  Writer called in order to coordinate with case manager, no answer LVM informing them of patient's d/c and to call back with any questions or concerns.

## 2022-10-20 LAB — COCAINE, URINE, CONFIRMATION: Confirm COC/METAB: POSITIVE

## 2022-10-22 ENCOUNTER — Other Ambulatory Visit: Payer: Self-pay

## 2022-10-29 ENCOUNTER — Other Ambulatory Visit: Payer: Self-pay

## 2022-10-29 ENCOUNTER — Emergency Department
Admission: EM | Admit: 2022-10-29 | Discharge: 2022-10-29 | Disposition: A | Discharge status: Home / Self Care | Payer: Medicaid Other | Source: Ambulatory Visit | Attending: Emergency Medicine | Admitting: Emergency Medicine

## 2022-10-29 ENCOUNTER — Encounter: Payer: Self-pay | Admitting: Gastroenterology

## 2022-10-29 ENCOUNTER — Encounter: Payer: Self-pay | Admitting: Student in an Organized Health Care Education/Training Program

## 2022-10-29 DIAGNOSIS — J45901 Unspecified asthma with (acute) exacerbation: Secondary | ICD-10-CM | POA: Insufficient documentation

## 2022-10-29 DIAGNOSIS — Z1152 Encounter for screening for COVID-19: Secondary | ICD-10-CM | POA: Insufficient documentation

## 2022-10-29 DIAGNOSIS — Z789 Other specified health status: Secondary | ICD-10-CM

## 2022-10-29 DIAGNOSIS — Z20822 Contact with and (suspected) exposure to covid-19: Secondary | ICD-10-CM

## 2022-10-29 DIAGNOSIS — F17210 Nicotine dependence, cigarettes, uncomplicated: Secondary | ICD-10-CM | POA: Insufficient documentation

## 2022-10-29 LAB — PERFORMING LAB

## 2022-10-29 LAB — COVID/INFLUENZA A & B/RSV NAAT (PCR)
COVID-19 NAAT (PCR): NEGATIVE
Influenza A NAAT (PCR): NEGATIVE
Influenza B NAAT (PCR): NEGATIVE
RSV NAAT (PCR): NEGATIVE

## 2022-10-29 MED ORDER — IPRATROPIUM-ALBUTEROL 0.5-2.5 MG/3ML IN SOLN *I*
3.0000 mL | Freq: Once | RESPIRATORY_TRACT | Status: AC
Start: 2022-10-29 — End: 2022-10-29
  Administered 2022-10-29: 3 mL via RESPIRATORY_TRACT
  Filled 2022-10-29: qty 3

## 2022-10-29 MED ORDER — ACETAMINOPHEN 500 MG PO TABS *I*
500.0000 mg | ORAL_TABLET | Freq: Four times a day (QID) | ORAL | 0 refills | Status: AC | PRN
Start: 2022-10-29 — End: 2022-11-28
  Filled 2022-10-29: qty 30, 8d supply, fill #0

## 2022-10-29 MED ORDER — ALBUTEROL SULFATE HFA 108 (90 BASE) MCG/ACT IN AERS *I*
1.0000 | INHALATION_SPRAY | Freq: Four times a day (QID) | RESPIRATORY_TRACT | 1 refills | Status: AC | PRN
Start: 2022-10-29 — End: 2022-11-28
  Filled 2022-10-29: qty 18, 25d supply, fill #0

## 2022-10-29 MED ORDER — BENZONATATE 100 MG PO CAPS *I*
100.0000 mg | ORAL_CAPSULE | Freq: Three times a day (TID) | ORAL | 0 refills | Status: AC | PRN
Start: 2022-10-29 — End: 2022-11-28
  Filled 2022-10-29: qty 90, 30d supply, fill #0

## 2022-10-29 MED ORDER — ACETAMINOPHEN 500 MG PO TABS *I*
1000.0000 mg | ORAL_TABLET | Freq: Once | ORAL | Status: AC
Start: 2022-10-29 — End: 2022-10-29
  Administered 2022-10-29: 1000 mg via ORAL
  Filled 2022-10-29: qty 2

## 2022-10-29 MED ORDER — BENZONATATE 100 MG PO CAPS *I*
100.0000 mg | ORAL_CAPSULE | Freq: Once | ORAL | Status: AC
Start: 2022-10-29 — End: 2022-10-29
  Administered 2022-10-29: 100 mg via ORAL
  Filled 2022-10-29: qty 1

## 2022-10-29 MED ORDER — IBUPROFEN 600 MG PO TABS *I*
600.0000 mg | ORAL_TABLET | Freq: Three times a day (TID) | ORAL | 0 refills | Status: AC | PRN
Start: 2022-10-29 — End: ?
  Filled 2022-10-29: qty 60, 20d supply, fill #0

## 2022-10-29 NOTE — ED Triage Notes (Addendum)
From home, SOB x 1 hour, states has felt like she had viral illness x 2 days, with cough with wheezing. Pt given albuterol in route with no relief. Pt in NAD in triage, 99% on RA. Hx asthma. EKG in triage.     Prehospital medications given: Yes  Respiratory/Allergic Reaction: Albuterol

## 2022-10-29 NOTE — Discharge Instructions (Signed)
You were evaluated in the emergency department for a complaint of difficulty breathing.  You received a COVID/influenza/RSV test.  You received a dose of albuterol nebulizer, and a prescription for your previously prescribed albuterol inhaler was sent to the pharmacy.  Use as directed.  Please follow-up with the resources that were provided to you by the social worker.  Also follow-up with your primary care medical provider.

## 2022-10-29 NOTE — ED Provider Notes (Cosign Needed)
History     Chief Complaint   Patient presents with    Shortness of Breath     Alexis Vang is a 31 y.o. female with a history of anxiety, asthma, depression, preeclampsia, substance abuse, paranoia, who presents ED with cough or congestion or shortness of breath, given albuterol in the way here.  Patient presently denies chest pain, she is in no distress, she is watching TV on her phone.  She reports having congestion.  No sick contacts.  She lives alone but reports being in a safe place.  Denies running out of meds.              Medical/Surgical/Family History     Past Medical History:   Diagnosis Date    Anxiety     Asthma     Depression     Gestational diabetes     Preeclampsia     Scabies     Substance abuse         Patient Active Problem List   Diagnosis Code    Paranoia F22    Scabies B86    High-risk pregnancy O09.90    PTSD (post-traumatic stress disorder) F43.10    History of cesarean section complicating pregnancy O34.219    H/O fetal anomaly in prior pregnancy, currently pregnant O09.299    History of gestational diabetes in prior pregnancy, currently pregnant O09.299, Z86.32    Hx of preeclampsia, prior pregnancy, currently pregnant O09.299    Substance use disorder F19.90    Homeless Z59.00    Asthma during pregnancy O99.519, J45.909    Trichomonas infection A59.9    Staph aureus infection A49.01    Rash of hands R21    Suicidal ideation R45.851    Rubella non-immune status, antepartum O09.899, Z28.39    GBS (group B Streptococcus carrier), +RV culture, currently pregnant O99.820    History of cesarean delivery Z98.891    Complex care coordination Z71.89            Past Surgical History:   Procedure Laterality Date    CESAREAN SECTION, LOW TRANSVERSE      Left leg surgery      TONSILLECTOMY AND ADENOIDECTOMY            Social History     Tobacco Use    Smoking status: Every Day     Packs/day: 0.50     Types: Cigarettes     Start date: 2002    Smokeless tobacco: Former     Types: Snuff, Chew    Substance Use Topics    Alcohol use: Not Currently     Comment: last drink unknown at this time    Drug use: Yes     Types: Cocaine, Methamphetamines, IV, Heroin, Marijuana     Comment: admits to past hx of drug abuse             Review of Systems    Physical Exam     Triage Vitals  Triage Start: Start, (10/29/22 1610)  First Recorded BP: 124/84, Resp: 22, Temp: 37.2 C (99 F) Oxygen Therapy SpO2: 99 %, O2 Device: None (Room air), Heart Rate: 70, (10/29/22 0509)  .  First Pain Reported  0-10 Scale: 0, (10/29/22 9604)       Physical Exam  Vitals and nursing note reviewed.   HENT:      Head: Normocephalic and atraumatic.      Right Ear: External ear normal.      Left Ear: External  ear normal.      Nose: Nose normal. No congestion.      Mouth/Throat:      Mouth: Mucous membranes are moist.      Pharynx: Oropharynx is clear.   Eyes:      General: No scleral icterus.        Right eye: No discharge.         Left eye: No discharge.      Conjunctiva/sclera: Conjunctivae normal.   Cardiovascular:      Comments: No cyanosis  Pulmonary:      Effort: Pulmonary effort is normal. No respiratory distress.      Breath sounds: No wheezing or rhonchi.   Abdominal:      General: Abdomen is flat. There is no distension.   Musculoskeletal:         General: No deformity or signs of injury.   Skin:     Coloration: Skin is not jaundiced.      Findings: No bruising.   Neurological:      General: No focal deficit present.      Mental Status: She is alert.      Motor: No weakness.   Psychiatric:         Behavior: Behavior normal.         Thought Content: Thought content normal.         Medical Decision Making   Patient seen by me on:  10/29/2022    Assessment:  Alexis Vang is a 31 y.o. female who presents to the ED with shortness of breath, cough congestion for about 2 days, worsening this morning.  Patient got albuterol on the way.  Physical exam is notable for a female with stable vital signs, lungs that are clear bilaterally, who is  watching TV.    Differential diagnosis:  Asthma exacerbation versus viral congestion, patient reassuringly has completely stable vital signs upon arrival and appears to be in no distress whatsoever.  We will hold on steroids currently.    Plan:  Orders Placed This Encounter      COVID/Influenza A & B/RSV NAAT (PCR)      acetaminophen (TYLENOL) tablet 1,000 mg      benzonatate (TESSALON) capsule 100 mg        ED Course and Disposition:  Patient stable throughout stay, in nad. Did not need albuterol which we ordered. Her Rx refilled. Patient discharged with strict return precautions.          ED Course as of 10/29/22 0703   Wed Oct 29, 2022   0548 EKG 12 lead (initial)  87 bpm, nsr, no std/ste, no R axis deviation   0657 On reassessment, patient is sleeping comfortably on room air. No cyanosis. Wakes easily, and has lungs CTAB.        Sheppard Penton, MD      Resident Attestation:    Patient seen by me on 10/29/2022.    I saw and evaluated the patient. I agree with the resident's/fellow's findings and plan of care as documented above.    Author:  Noah Delaine, MD         Sheppard Penton, MD  Resident  10/29/22 1610       Noah Delaine, MD  10/30/22 [phone]

## 2022-11-03 ENCOUNTER — Telehealth: Payer: Self-pay

## 2022-11-03 ENCOUNTER — Other Ambulatory Visit: Payer: Self-pay

## 2022-11-03 NOTE — Telephone Encounter (Signed)
Patient NOS for Colposcopy appointment today. Letter pended and routed to mail to address on file.

## 2022-11-03 NOTE — Telephone Encounter (Signed)
Writer called and spoke with patient. Patient stated she was not aware of the appointment for today. Patient stated she is not mentally stabled to have any appointments. Letter written by nurse printed and mailed to patient.

## 2022-11-04 ENCOUNTER — Encounter: Payer: Self-pay | Admitting: Gastroenterology

## 2022-11-19 ENCOUNTER — Telehealth: Payer: Self-pay

## 2022-11-19 NOTE — Telephone Encounter (Signed)
Mail returned to sender, sent to scanning.

## 2022-12-01 NOTE — Telephone Encounter (Signed)
Writer called and spoke with patient. Patient scheduled.

## 2022-12-02 ENCOUNTER — Other Ambulatory Visit: Payer: Self-pay

## 2022-12-02 ENCOUNTER — Encounter: Payer: Self-pay | Admitting: Gastroenterology

## 2022-12-02 ENCOUNTER — Emergency Department
Admission: EM | Admit: 2022-12-02 | Discharge: 2022-12-03 | Disposition: A | Discharge status: Home / Self Care | Payer: Medicaid Other | Source: Ambulatory Visit | Attending: Student in an Organized Health Care Education/Training Program | Admitting: Student in an Organized Health Care Education/Training Program

## 2022-12-02 DIAGNOSIS — R102 Pelvic and perineal pain: Secondary | ICD-10-CM | POA: Insufficient documentation

## 2022-12-02 DIAGNOSIS — H5712 Ocular pain, left eye: Secondary | ICD-10-CM | POA: Insufficient documentation

## 2022-12-02 DIAGNOSIS — F17210 Nicotine dependence, cigarettes, uncomplicated: Secondary | ICD-10-CM | POA: Insufficient documentation

## 2022-12-02 DIAGNOSIS — Z113 Encounter for screening for infections with a predominantly sexual mode of transmission: Secondary | ICD-10-CM | POA: Insufficient documentation

## 2022-12-02 DIAGNOSIS — R1030 Lower abdominal pain, unspecified: Secondary | ICD-10-CM

## 2022-12-02 DIAGNOSIS — N760 Acute vaginitis: Secondary | ICD-10-CM | POA: Insufficient documentation

## 2022-12-02 LAB — URINALYSIS REFLEX TO CULTURE
Blood,UA: NEGATIVE
Glucose,UA: NEGATIVE
Ketones, UA: NEGATIVE
Leuk Esterase,UA: NEGATIVE
Nitrite,UA: NEGATIVE
Protein,UA: NEGATIVE
Specific Gravity,UA: 1.027 (ref 1.002–1.030)
pH,UA: 5.5 (ref 5.0–8.0)

## 2022-12-02 LAB — POCT URINE PREGNANCY
Lot #: 626682
Preg Test,UR POC: NEGATIVE

## 2022-12-02 MED ORDER — CEFTRIAXONE 350 MG/ML IM IN STERILE WATER (500MG VIAL) *I*
500.0000 mg | INJECTION | Freq: Once | INTRAMUSCULAR | Status: DC
Start: 2022-12-02 — End: 2022-12-03

## 2022-12-02 MED ORDER — METRONIDAZOLE 500 MG PO TABS *I*
500.0000 mg | ORAL_TABLET | Freq: Two times a day (BID) | ORAL | 0 refills | Status: AC
Start: 2022-12-02 — End: 2022-12-10
  Filled 2022-12-02: qty 14, 7d supply, fill #0

## 2022-12-02 MED ORDER — CEFTRIAXONE IN LIDOCAINE 1% (350MG/ML) 500 MG *I*
500.0000 mg | Freq: Once | INTRAMUSCULAR | Status: AC
Start: 2022-12-02 — End: 2022-12-02
  Administered 2022-12-02: 500 mg via INTRAMUSCULAR
  Filled 2022-12-02: qty 1.43

## 2022-12-02 MED ORDER — DOXYCYCLINE HYCLATE 100 MG PO TABS *I*
100.0000 mg | ORAL_TABLET | Freq: Two times a day (BID) | ORAL | 0 refills | Status: AC
Start: 2022-12-02 — End: 2022-12-10
  Filled 2022-12-02: qty 14, 7d supply, fill #0

## 2022-12-02 MED ORDER — ERYTHROMYCIN 5 MG/GM OP OINT *I*
TOPICAL_OINTMENT | Freq: Three times a day (TID) | OPHTHALMIC | 0 refills | Status: AC
Start: 2022-12-02 — End: 2022-12-08
  Filled 2022-12-02: qty 3.5, 5d supply, fill #0

## 2022-12-02 NOTE — Discharge Instructions (Addendum)
You were seen for pelvic pain, and eye discomfort. Your pregnancy test was negative and urine test did not show signs of a bladder infection. We are treating you for STIs, please take your antibiotics as directed on the bottle. We also prescribed you ointment for your eye, please take as directed. A referral was placed for you to see a primary care doctor and a gynecologist, they will call you schedule an appointment. If you experience worsening lower abdominal pain, worsening vaginal discharge, or fever, please return to the emergency department.
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
## 2022-12-03 ENCOUNTER — Other Ambulatory Visit: Payer: Self-pay

## 2022-12-03 LAB — N. GONORRHOEAE NAAT (PCR): N. gonorrhoeae NAAT (PCR): NEGATIVE

## 2022-12-03 LAB — VAGINITIS SCREEN: DNA PROBE: Vaginitis Screen:DNA Probe: POSITIVE — AB

## 2022-12-03 LAB — HIV-1/2  ANTIGEN/ANTIBODY SCREEN WITH CONFIRMATION: HIV 1&2 ANTIGEN/ANTIBODY: NONREACTIVE

## 2022-12-03 LAB — SYPHILIS SCREEN
Syphilis Screen: NEGATIVE
Syphilis Status: NONREACTIVE

## 2022-12-03 LAB — CHLAMYDIA NAAT (PCR): Chlamydia NAAT (PCR): NEGATIVE
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
## 2022-12-03 NOTE — ED Notes (Signed)
PT safe to be discharged home. Discharge instructions reviewed. PT verbalized understanding of discharge instructions and will follow up with PCP. Medications reviewed. E-presecprition reviewed. Pt ambulatory with steady gait. VSS.

## 2022-12-04 ENCOUNTER — Telehealth: Payer: Self-pay

## 2022-12-04 MED ORDER — FLUCONAZOLE 150 MG PO TABS *I*
150.0000 mg | ORAL_TABLET | Freq: Once | ORAL | 0 refills | Status: AC
Start: 2022-12-04 — End: 2022-12-04

## 2022-12-04 NOTE — Telephone Encounter (Signed)
[facility] ED nurse calling with Pt lab results that were not treated in ED. Pt was positive for yeast on vaginal swab.

## 2022-12-08 ENCOUNTER — Encounter: Payer: Self-pay | Admitting: Gastroenterology

## 2022-12-25 ENCOUNTER — Telehealth: Payer: Self-pay

## 2022-12-25 NOTE — Telephone Encounter (Signed)
Mail returned to sender, sent to scanning.

## 2023-01-02 ENCOUNTER — Emergency Department
Admission: EM | Admit: 2023-01-02 | Discharge: 2023-01-02 | Discharge status: Against Medical Advice | Payer: Medicaid Other | Source: Ambulatory Visit

## 2023-01-02 ENCOUNTER — Other Ambulatory Visit: Payer: Self-pay

## 2023-01-02 NOTE — ED Triage Notes (Signed)
Pt presents with SOB x1 week. Pt was wheezing for EMS and received decadron and duoneb. Pt denies pain. Hx Asthma. EKG in Triage     Blood Glucose Meter (mg/dl): 93  Prehospital medications given: Yes  Respiratory/Allergic Reaction: DuoNeb; Decadron (1 Duoneb, 10mg  Decadron IV)

## 2023-01-04 ENCOUNTER — Emergency Department
Admission: EM | Admit: 2023-01-04 | Discharge: 2023-01-04 | Disposition: A | Discharge status: Home / Self Care | Payer: Medicaid Other | Source: Ambulatory Visit | Attending: Emergency Medicine | Admitting: Emergency Medicine

## 2023-01-04 ENCOUNTER — Other Ambulatory Visit: Payer: Self-pay

## 2023-01-04 ENCOUNTER — Encounter: Payer: Self-pay | Admitting: Emergency Medicine

## 2023-01-04 DIAGNOSIS — Z20822 Contact with and (suspected) exposure to covid-19: Secondary | ICD-10-CM

## 2023-01-04 DIAGNOSIS — R0602 Shortness of breath: Secondary | ICD-10-CM

## 2023-01-04 DIAGNOSIS — R059 Cough, unspecified: Secondary | ICD-10-CM

## 2023-01-04 DIAGNOSIS — Z1152 Encounter for screening for COVID-19: Secondary | ICD-10-CM | POA: Insufficient documentation

## 2023-01-04 DIAGNOSIS — F17210 Nicotine dependence, cigarettes, uncomplicated: Secondary | ICD-10-CM

## 2023-01-04 DIAGNOSIS — J45901 Unspecified asthma with (acute) exacerbation: Secondary | ICD-10-CM

## 2023-01-04 LAB — COVID/INFLUENZA A & B/RSV NAAT (PCR)
COVID-19 NAAT (PCR): NEGATIVE
Influenza A NAAT (PCR): NEGATIVE
Influenza B NAAT (PCR): NEGATIVE
RSV NAAT (PCR): NEGATIVE

## 2023-01-04 LAB — POCT URINE PREGNANCY
Lot #: 626682
Preg Test,UR POC: NEGATIVE

## 2023-01-04 LAB — PERFORMING LAB

## 2023-01-04 MED ORDER — ALBUTEROL SULFATE HFA 108 (90 BASE) MCG/ACT IN AERS *I*
1.0000 | INHALATION_SPRAY | Freq: Four times a day (QID) | RESPIRATORY_TRACT | 0 refills | Status: AC | PRN
Start: 2023-01-04 — End: 2023-02-03
  Filled 2023-01-04: qty 18, 25d supply, fill #0

## 2023-01-04 MED ORDER — ALBUTEROL SULFATE (2.5 MG/3ML) 0.083% IN NEBU *I*
2.5000 mg | INHALATION_SOLUTION | Freq: Once | RESPIRATORY_TRACT | Status: AC
Start: 2023-01-04 — End: 2023-01-04
  Administered 2023-01-04: 2.5 mg via RESPIRATORY_TRACT
  Filled 2023-01-04: qty 3

## 2023-01-04 MED ORDER — PREDNISONE 20 MG PO TABS *I*
40.0000 mg | ORAL_TABLET | Freq: Every day | ORAL | 0 refills | Status: AC
Start: 2023-01-04 — End: 2023-01-08
  Filled 2023-01-04: qty 8, 4d supply, fill #0

## 2023-01-04 MED ORDER — PREDNISONE 20 MG PO TABS *I*
60.0000 mg | ORAL_TABLET | Freq: Once | ORAL | Status: AC
Start: 2023-01-04 — End: 2023-01-04
  Administered 2023-01-04: 60 mg via ORAL
  Filled 2023-01-04: qty 3

## 2023-01-04 NOTE — ED Triage Notes (Signed)
C/o asthma. Out of inhaler at home. Requesting prescription for nebs at home. Received a dueneb en route with relief. 98% on RA. Also requesting tetanus shot while here.     EMS found bed bugs enroute. States patient refused to let them in the apartment. Decon'd on arrival.     Prehospital medications given: No

## 2023-01-04 NOTE — ED Provider Notes (Signed)
History     Chief Complaint   Patient presents with    Asthma     32 year old female with past medical history of asthma, depression, substance abuse, presents for evaluation of asthma and difficulty breathing.  She states has been having worsening breathing difficulties for the past 3 to 4 weeks.  She endorses a cough.  States that she is out of her inhaler.  Asking for steroids and nebs at home.  Per EMS, bedbugs were found on route, patient decon'd on arrival.      History provided by:  Patient and EMS personnel        Medical/Surgical/Family History     Past Medical History:   Diagnosis Date    Anxiety     Asthma     Depression     Gestational diabetes     Preeclampsia     Scabies     Substance abuse         Patient Active Problem List   Diagnosis Code    Paranoia F22    Scabies B86    High-risk pregnancy O09.90    PTSD (post-traumatic stress disorder) F43.10    History of cesarean section complicating pregnancy O34.219    H/O fetal anomaly in prior pregnancy, currently pregnant O09.299    History of gestational diabetes in prior pregnancy, currently pregnant O09.299, Z86.32    Hx of preeclampsia, prior pregnancy, currently pregnant O09.299    Substance use disorder F19.90    Homeless Z59.00    Asthma during pregnancy O99.519, J45.909    Trichomonas infection A59.9    Staph aureus infection A49.01    Rash of hands R21    Suicidal ideation R45.851    Rubella non-immune status, antepartum O09.899, Z28.39    GBS (group B Streptococcus carrier), +RV culture, currently pregnant O99.820    History of cesarean delivery Z98.891    Complex care coordination Z71.89            Past Surgical History:   Procedure Laterality Date    CESAREAN SECTION, LOW TRANSVERSE      Left leg surgery      TONSILLECTOMY AND ADENOIDECTOMY            Social History     Tobacco Use    Smoking status: Every Day     Packs/day: .5     Types: Cigarettes     Start date: 2002    Smokeless tobacco: Former     Types: Snuff, Chew   Substance Use  Topics    Alcohol use: Not Currently     Comment: last drink unknown at this time    Drug use: Yes     Types: Cocaine, Methamphetamines, IV, Heroin, Marijuana     Comment: admits to past hx of drug abuse             Review of Systems   Respiratory:  Positive for cough, shortness of breath and wheezing.        Physical Exam     Triage Vitals  Triage Start: Start, (01/04/23 4540)  First Recorded BP: (!) 143/91, Resp: 22, Temp: 36.9 C (98.4 F), Temp src: Infrared Oxygen Therapy SpO2: 98 %, O2 Device: None (Room air), Heart Rate: 81, (01/04/23 0612)  .  First Pain Reported  0-10 Scale: 0, (01/04/23 9811)       Physical Exam  Vitals and nursing note reviewed.   Constitutional:       General: She is not in acute  distress.     Appearance: She is well-developed.      Comments: A bit disheveled appearing   HENT:      Head: Normocephalic and atraumatic.   Eyes:      Conjunctiva/sclera: Conjunctivae normal.   Cardiovascular:      Rate and Rhythm: Normal rate and regular rhythm.      Heart sounds: Normal heart sounds.   Pulmonary:      Effort: Pulmonary effort is normal.      Breath sounds: Wheezing present.      Comments: Frequent dry cough, esp with inspiration  Abdominal:      General: There is no distension.      Palpations: Abdomen is soft.      Tenderness: There is no abdominal tenderness.   Musculoskeletal:         General: No deformity.   Skin:     General: Skin is warm and dry.   Neurological:      Mental Status: She is alert.      Comments: Moves all 4 extremities without focal neurologic deficits   Psychiatric:         Behavior: Behavior normal.         Medical Decision Making   Patient seen by me on:  01/04/2023    Assessment:  32 year old female with history of asthma and substance abuse presents for what appears to be an eczema exacerbation.  It appears slightly wheezy with frequent coughing on exam.  Overall her breathing is reassuring and her sats are normal.    Differential diagnosis:  Asthma, viral  illness    Plan:  Viral swab.  P.o. prednisone and nebulizer to help with symptoms.  Orders Placed This Encounter      COVID/Influenza A & B/RSV NAAT (PCR)      Initiate COVID precautions      Initiate droplet isolation  Medications  albuterol (PROVENTIL) nebulization 2.5 mg (2.5 mg Nebulization Given 01/04/23 0732)  predniSONE (DELTASONE) tablet 60 mg (60 mg Oral Given 01/04/23 0732)      ED Course and Disposition:        Labs reviewed, negative viral cultures.   Preg test is also negative.   Rx for Prednisone x 4 additional days and Ventolin inhaler provided. Advised PCP follow up. Stable for d/c.       Concha Se, MD          Author:  Concha Se, MD         Concha Se, MD  01/04/23 [phone]

## 2023-01-04 NOTE — Discharge Instructions (Signed)
You were seen today for cough and wheezing.   You should continue your inhaler 2 puffs every 4 hours as needed for cough and wheezing. Avoid smoke, as this will worsen your symptoms.   Return for worsening difficulty breathing, high fever, or new concerning symptoms.     Your pregnancy test was negative, and your swab was negative for flu, covid, or RSV.

## 2023-01-04 NOTE — ED Provider Progress Notes (Deleted)
ED Provider Progress Note      ED Course as of 01/04/23 1609   Sun Jan 04, 2023   1607 COVID-19 PCR: NEGATIVE   1607 Influenza A NAAT (PCR): NEGATIVE   1607 Influenza B PCR: NEGATIVE   1607 RSV PCR: NEGATIVE   1607 Preg Test,UR POC: Negative        Concha Se, MD, 01/04/2023, 8:57 AM     Concha Se, MD  01/04/23 1610       Concha Se, MD  01/04/23 1609

## 2023-01-16 ENCOUNTER — Other Ambulatory Visit: Payer: Self-pay

## 2023-01-16 MED ORDER — MOXIFLOXACIN HCL 0.5 % OP SOLN *I*
OPHTHALMIC | 1 refills | Status: DC
Start: 2023-01-16 — End: 2024-07-08
  Filled 2023-01-16: qty 3, 12d supply, fill #0

## 2023-01-16 MED ORDER — ERYTHROMYCIN 5 MG/GM OP OINT *I*
TOPICAL_OINTMENT | OPHTHALMIC | 2 refills | Status: DC
Start: 2023-01-16 — End: 2024-07-08
  Filled 2023-01-16: qty 3.5, 10d supply, fill #0

## 2023-01-19 ENCOUNTER — Other Ambulatory Visit: Payer: Self-pay

## 2023-01-19 ENCOUNTER — Telehealth: Payer: Self-pay

## 2023-01-19 NOTE — Telephone Encounter (Signed)
No show letter sent, mailed since pt does not have myChart.

## 2023-01-26 ENCOUNTER — Other Ambulatory Visit: Payer: Self-pay

## 2023-01-29 ENCOUNTER — Other Ambulatory Visit: Payer: Self-pay

## 2023-02-05 ENCOUNTER — Emergency Department
Admission: EM | Admit: 2023-02-05 | Discharge: 2023-02-05 | Disposition: A | Discharge status: Home / Self Care | Payer: Medicaid Other | Source: Ambulatory Visit | Attending: Emergency Medicine | Admitting: Emergency Medicine

## 2023-02-05 ENCOUNTER — Other Ambulatory Visit: Payer: Self-pay

## 2023-02-05 ENCOUNTER — Emergency Department: Payer: Medicaid Other

## 2023-02-05 DIAGNOSIS — J45901 Unspecified asthma with (acute) exacerbation: Secondary | ICD-10-CM | POA: Insufficient documentation

## 2023-02-05 DIAGNOSIS — R21 Rash and other nonspecific skin eruption: Secondary | ICD-10-CM | POA: Insufficient documentation

## 2023-02-05 DIAGNOSIS — R0602 Shortness of breath: Secondary | ICD-10-CM

## 2023-02-05 DIAGNOSIS — R062 Wheezing: Secondary | ICD-10-CM

## 2023-02-05 MED ORDER — FLUCONAZOLE 150 MG PO TABS *I*
150.0000 mg | ORAL_TABLET | ORAL | 0 refills | Status: AC
Start: 2023-02-05 — End: 2023-03-19
  Filled 2023-02-05: qty 6, 42d supply, fill #0

## 2023-02-05 MED ORDER — PREDNISONE 20 MG PO TABS *I*
40.0000 mg | ORAL_TABLET | Freq: Every day | ORAL | 0 refills | Status: AC
Start: 2023-02-05 — End: 2023-02-10
  Filled 2023-02-05: qty 10, 5d supply, fill #0

## 2023-02-05 MED ORDER — ALBUTEROL SULFATE HFA 108 (90 BASE) MCG/ACT IN AERS *I*
1.0000 | INHALATION_SPRAY | Freq: Four times a day (QID) | RESPIRATORY_TRACT | 0 refills | Status: AC | PRN
Start: 2023-02-05 — End: 2023-03-07
  Filled 2023-02-05: qty 8.5, 25d supply, fill #0

## 2023-02-05 MED ORDER — TERBINAFINE HCL 1 % EX CREA *I*
TOPICAL_CREAM | Freq: Two times a day (BID) | CUTANEOUS | 0 refills | Status: DC
Start: 2023-02-05 — End: 2023-02-09
  Filled 2023-02-05: qty 30, 30d supply, fill #0

## 2023-02-05 NOTE — ED Triage Notes (Signed)
Pt called for a rash on bilateral hands/fingers x1 months. Pt found wheezing, given duoneb and benadryl.   Hx of asthma and scabies    Denies SI, HI, ETOH or drugs.   Ambulatory for EMS.    Blood Glucose Meter (mg/dl): 161  Prehospital medications given: Yes  Respiratory/Allergic Reaction: DuoNeb; Benadryl (duonebx1 and 50mg  benadryl IV)

## 2023-02-05 NOTE — ED Notes (Signed)
Writer found pt in the waiting room. Confirmed that the PIV was removed.

## 2023-02-05 NOTE — ED Provider Notes (Addendum)
History     Chief Complaint   Patient presents with    Rash    Shortness of Breath     Alexis Vang Tannery is a 32 y.o. female with PMH significant for anxiety, depression, asthma, gestational diabetes, scabies, substance abuse, paranoia, staph aureus, hand rash presents for evaluation of bilateral hand rash present x 1 month and sudden onset shortness of breath and wheezing today.  Patient reports she had a sudden episode of wheezing and difficulty breathing today.  She received DuoNeb and Benadryl from EMS on arrival with symptoms resolving shortly after arrival in the ED.  She reports she ran out of her previously prescribed albuterol inhaler.  She has been seen in ED multiple times for similar complaints and given refills but denies that she has a PCP to follow-up with.  Regarding her hand rash it has been present for least 1 month per patient however she is had a similar rash most recently in October 2023 for which she was seen by dermatology here and recommended to start fluconazole x 6 weeks and Lamisil cream twice daily until symptoms resolved.  It is appears on chart review that she received the Lamisil while admitted to the [facility] for an concurrent psychosis but did not receive fluconazole.  She does not appear to have also received discharge medications to this effect.  She reports the rash has gotten worse over the past month noting increased itching and bleeding when she scratches.  She has not treated with anything but repeatedly requesting various creams for it.  She reports thinking it might be leprosy but does not provide any specific information for her rationale for this.  Does have a noted history of scabies but has not been treated for it recently.  She denies fever/chills, nausea vomiting, chest pain, SOB, weakness.      History provided by:  Patient and medical records  Language interpreter used: No          Medical/Surgical/Family History     Past Medical History:   Diagnosis Date    Anxiety      Asthma     Depression     Gestational diabetes     Preeclampsia     Scabies     Substance abuse         Patient Active Problem List   Diagnosis Code    Paranoia F22    Scabies B86    High-risk pregnancy O09.90    PTSD (post-traumatic stress disorder) F43.10    History of cesarean section complicating pregnancy O34.219    H/O fetal anomaly in prior pregnancy, currently pregnant O09.299    History of gestational diabetes in prior pregnancy, currently pregnant O09.299, Z86.32    Hx of preeclampsia, prior pregnancy, currently pregnant O09.299    Substance use disorder F19.90    Homeless Z59.00    Asthma during pregnancy O99.519, J45.909    Trichomonas infection A59.9    Staph aureus infection A49.01    Rash of hands R21    Suicidal ideation R45.851    Rubella non-immune status, antepartum O09.899, Z28.39    GBS (group B Streptococcus carrier), +RV culture, currently pregnant O99.820    History of cesarean delivery Z98.891    Complex care coordination Z71.89            Past Surgical History:   Procedure Laterality Date    CESAREAN SECTION, LOW TRANSVERSE      Left leg surgery      TONSILLECTOMY AND ADENOIDECTOMY  Social History     Tobacco Use    Smoking status: Every Day     Packs/day: .5     Types: Cigarettes     Start date: 2002    Smokeless tobacco: Former     Types: Snuff, Chew   Substance Use Topics    Alcohol use: Not Currently     Comment: last drink unknown at this time    Drug use: Yes     Types: Cocaine, Methamphetamines, IV, Heroin, Marijuana     Comment: admits to past hx of drug abuse             Review of Systems    Physical Exam     Triage Vitals  Triage Start: Start, (02/05/23 1108)  First Recorded BP: 122/86, Resp: 20, Temp: 36.7 C (98.1 F) Oxygen Therapy SpO2: 100 %, O2 Device: None (Room air), Heart Rate: 88, (02/05/23 1111)  .  First Pain Reported  0-10 Scale: 0, (02/05/23 1111)       Physical Exam  Vitals and nursing note reviewed.   Constitutional:       General: She is awake. She is  not in acute distress.     Appearance: Normal appearance. She is well-developed. She is not ill-appearing, toxic-appearing or diaphoretic.   HENT:      Head: Normocephalic.   Cardiovascular:      Rate and Rhythm: Normal rate and regular rhythm.      Heart sounds: Normal heart sounds, S1 normal and S2 normal.   Pulmonary:      Effort: Pulmonary effort is normal.      Breath sounds: Normal breath sounds and air entry.   Musculoskeletal:      Right hand: Swelling and tenderness present. Normal sensation. Normal capillary refill. Normal pulse.      Left hand: Swelling and tenderness present. Normal sensation. Normal capillary refill. Normal pulse.      Comments: See photo below.  Diffuse erythema/ulceration with white scaling in the webspaces hands bilaterally.  TTP and difficulty abducting fingers due to pain mild bilateral hand swelling.  Sensation intact throughout all pulses intact and cap refill within normal limits   Skin:     General: Skin is warm and dry.      Capillary Refill: Capillary refill takes 2 to 3 seconds.      Findings: Rash present.      Comments: Erythematous/ulcerative lesions in webspaces of hands bilaterally with white crusting on size.  Noted to be pruritic per patient and painful on palpation   Neurological:      General: No focal deficit present.      Mental Status: She is alert and oriented to person, place, and time.      GCS: GCS eye subscore is 4. GCS verbal subscore is 5. GCS motor subscore is 6.   Psychiatric:         Attention and Perception: Attention normal.         Mood and Affect: Mood normal.         Speech: Speech is tangential.         Behavior: Behavior is cooperative.         "Gonzella Lex, NP,certify that the patient has authorized the use of photography for the purpose of the provision of health care at the [facility] and affiliates and they understand that it will be included in the legal medical record."    Medical Decision Making  Patient  seen by me on:  02/05/2023    Assessment:  Alexis Vang Alexis Vang is a 32 y.o. female with PMH significant for anxiety, depression, asthma, gestational diabetes, scabies, substance abuse, paranoia, staph aureus, hand rash presents for evaluation of bilateral hand rash present x 1 month and sudden onset shortness of breath and wheezing today.  Exam notable for bilateral hand rash in the webspaces of all fingers sparing thumb spaces.  She is otherwise well-appearing and in no acute distress.    Differential diagnosis:  Fungal infection, eczema flare, contact dermatitis, erosio interdigitalis blastomycetica, asthma exacerbation, URI, pneumonia    Plan:  Chest x-ray  Discharge home with referrals to PCP, dermatology  Prescribed Lamisil and fluconazole    Review of existing & external labs / records: Most recent ED visits ED visits including visit on 10/13/2022 for psychosis and hand rash    ED Course and Disposition:  Chest x-ray and vital signs unremarkable.  After further reviewing her chart it appears she presented for similar rash back in October 2023.  It appears that her treatment close incomplete.  On review of previous photos and evaluation by dermatology her rash today appears similar to suspected erosio interdigitalis blastomycetica that dermatology included differential.  With this in mind, I prescribed her Lamisil cream twice daily until she improved and then for 2 more weeks and fluconazole once daily for the next 6 weeks.  Also provided with a referral to dermatology refilled her albuterol inhaler and prescribed 5 days of prednisone for likely asthma exacerbation.  Patient discharged with ED return precautions and discussion of appropriate follow-up.              Feliciana Rossetti, NP            Feliciana Rossetti, NP  02/05/23 2002       Feliciana Rossetti, NP  02/05/23 2002

## 2023-02-05 NOTE — ED Notes (Signed)
Pt left the room prior to discharge VS and prior to writer removing Left AC IV. Charge RN provider made aware.

## 2023-02-05 NOTE — Discharge Instructions (Signed)
You were seen and evaluated in the emergency department today for hand rash and shortness of breath. You had an x-ray which showed no acute findings. Your exam was consistent with a fungal infection of your hand. We have prescribed you a cream to be applied twice a day until the rash resolves, then for another 2 weeks after that. We have also prescribed an antifungal pill to be taken once a day for the next 6 weeks. Try to keep your hands dry when possible. We have sent referral to dermatology for you. You should be contacted in the next few days to setup an appointment.    Your breathing troubles appear to have resolved. We have prescribed you a refill of your albuterol inhaler, you may use 1-2 puffs every 4-6 hours for wheezing or shortness of breath. If you need it more than twice, return to the ED immediately. We have prescribed you 5 days of prednisone, please take as prescribed once a day.     Please continue to take all home medications as previously prescribed.    A referral to primary care has been placed on your behalf. You will be contacted in the next few days to setup an appointment. We have also provided a list PCPs accepting patients in the area, you may contact them as well to setup your own new appointment.    Return to the emergency department/urgent care if you experience new, worsening or concerning symptoms.

## 2023-02-08 ENCOUNTER — Other Ambulatory Visit: Payer: Self-pay

## 2023-02-08 ENCOUNTER — Emergency Department
Admission: EM | Admit: 2023-02-08 | Discharge: 2023-02-08 | Discharge status: Against Medical Advice | Payer: Medicaid Other | Source: Ambulatory Visit

## 2023-02-08 NOTE — ED Triage Notes (Signed)
Pt presents to the ED from [facility] w/c/o left eye pain d/t her stye popping. Pt also states she has eczema on her hands and they are cracked. PT states she also wants a pregnancy and ED testing for reasons she did not want to disclose.     Prehospital medications given: No

## 2023-02-08 NOTE — ED Notes (Signed)
Patient left.

## 2023-02-09 ENCOUNTER — Other Ambulatory Visit: Payer: Self-pay

## 2023-02-09 ENCOUNTER — Emergency Department
Admission: EM | Admit: 2023-02-09 | Discharge: 2023-02-09 | Disposition: A | Discharge status: Home / Self Care | Payer: Medicaid Other | Source: Ambulatory Visit | Attending: Emergency Medicine | Admitting: Emergency Medicine

## 2023-02-09 DIAGNOSIS — F17210 Nicotine dependence, cigarettes, uncomplicated: Secondary | ICD-10-CM | POA: Insufficient documentation

## 2023-02-09 DIAGNOSIS — H00015 Hordeolum externum left lower eyelid: Secondary | ICD-10-CM | POA: Insufficient documentation

## 2023-02-09 MED ORDER — TERBINAFINE HCL 1 % EX CREA *I*
TOPICAL_CREAM | Freq: Two times a day (BID) | CUTANEOUS | 0 refills | Status: DC
Start: 2023-02-09 — End: 2023-02-09
  Filled 2023-02-09: qty 30, 30d supply, fill #0

## 2023-02-09 MED ORDER — CLOTRIMAZOLE 1 % EX CREA *I*
TOPICAL_CREAM | Freq: Two times a day (BID) | CUTANEOUS | 0 refills | Status: AC
Start: 2023-02-09 — End: 2023-03-11
  Filled 2023-02-09: qty 15, 15d supply, fill #0

## 2023-02-09 NOTE — ED Provider Notes (Signed)
History     Chief Complaint   Patient presents with    Eye Problem     Patient is a 32 year old female with a past medical history of PTSD, paranoia, scabies, substance abuse who presents emergency department today with a lesion to her left eye.  After discussion with the patient she is unsure exactly how long it has been there for.  She does report that it is getting worse which prompted her to come to the emergency department.  She has not tried anything for it at this time.  She denies any blurry vision, double vision.  Denies any loss of vision.  Denies any pain with eye movements.  Denies any fever or chills.    Patient also reports that she has run out of her topical terbinafine for her right hand and was looking for a refill.      History provided by:  Patient  Language interpreter used: No          Medical/Surgical/Family History     Past Medical History:   Diagnosis Date    Anxiety     Asthma     Depression     Gestational diabetes     Preeclampsia     Scabies     Substance abuse         Patient Active Problem List   Diagnosis Code    Paranoia F22    Scabies B86    High-risk pregnancy O09.90    PTSD (post-traumatic stress disorder) F43.10    History of cesarean section complicating pregnancy O34.219    H/O fetal anomaly in prior pregnancy, currently pregnant O09.299    History of gestational diabetes in prior pregnancy, currently pregnant O09.299, Z86.32    Hx of preeclampsia, prior pregnancy, currently pregnant O09.299    Substance use disorder F19.90    Homeless Z59.00    Asthma during pregnancy O99.519, J45.909    Trichomonas infection A59.9    Staph aureus infection A49.01    Rash of hands R21    Suicidal ideation R45.851    Rubella non-immune status, antepartum O09.899, Z28.39    GBS (group B Streptococcus carrier), +RV culture, currently pregnant O99.820    History of cesarean delivery Z98.891    Complex care coordination Z71.89            Past Surgical History:   Procedure Laterality Date     CESAREAN SECTION, LOW TRANSVERSE      Left leg surgery      TONSILLECTOMY AND ADENOIDECTOMY            Social History     Tobacco Use    Smoking status: Every Day     Packs/day: .5     Types: Cigarettes     Start date: 2002    Smokeless tobacco: Former     Types: Snuff, Chew   Substance Use Topics    Alcohol use: Not Currently     Comment: last drink unknown at this time    Drug use: Yes     Types: Cocaine, Methamphetamines, IV, Heroin, Marijuana     Comment: admits to past hx of drug abuse             Review of Systems   Eyes:  Positive for pain and redness.       Physical Exam     Triage Vitals  Triage Start: Start, (02/09/23 2956)  First Recorded BP: 124/86, Resp: 18, Temp: 35.6 C (96.1 F), Temp src:  TEMPORAL Oxygen Therapy SpO2: 97 %, Oximetry Source: Rt Hand, O2 Device: None (Room air), Heart Rate: 91, (02/09/23 0825)  .  First Pain Reported  0-10 Scale: 10, Pain Location/Orientation: Head;Eye Left, (02/09/23 0825)       Physical Exam  Constitutional:       General: She is not in acute distress.     Appearance: She is normal weight. She is not ill-appearing, toxic-appearing or diaphoretic.   HENT:      Head: Atraumatic.   Eyes:      Comments: Borderline noted in the left lower eyelid.  EOMI, PERRLA, no nystagmus.  Vision intact.  No surrounding cellulitis or proptosis   Cardiovascular:      Rate and Rhythm: Normal rate and regular rhythm.      Pulses: Normal pulses.      Heart sounds: Normal heart sounds.      No friction rub.   Pulmonary:      Effort: Pulmonary effort is normal.      Breath sounds: Normal breath sounds.   Abdominal:      General: Bowel sounds are normal.   Musculoskeletal:      Comments: Skin on right hand appears dry.     Skin:     General: Skin is warm and dry.   Neurological:      General: No focal deficit present.      Mental Status: She is alert and oriented to person, place, and time. Mental status is at baseline.   Psychiatric:         Mood and Affect: Mood normal.         Behavior:  Behavior normal.         Medical Decision Making   Patient seen by me on:  02/09/2023    Assessment:  Patient is a 32 year old female presents emergency department with swelling to her left eye.  She reports that she feel like this is been there for the last few days but is unsure exactly when it started.  She reports that is getting worse which prompted her to come to the emergency department.    Patient also reports that she ran out of terbinafine for her right hand and was looking for refill.    Differential diagnosis:  Hordeolum  Chalazion  Fungal infection  Preseptal cellulitis  Orbital cellulitis    Plan:  No orders of the defined types were placed in this encounter.    Medications - No data to display      ED Course and Disposition:  Patient's presentation today is most consistent with a hordeolum. she has able to move her eyes, there is no surrounding erythema that would be suggestive of a preseptal cellulitis or an orbital cellulitis.  Discussed with patient that it is very important that she continues warm compresses, 4 times a day for 10 minutes.  Discussed that she may massage in between.  Discussed avoiding cosmetics.  The patient was understanding and will be discharged at this time.    This note was dictated using Conservation officer, historic buildings. A reasonable effort was made to proofread this note but there may be minor transcription/typographical errors.                [address] [place]            [place], [place], [place]  02/09/23 [phone]

## 2023-02-09 NOTE — Discharge Instructions (Signed)
You were seen in the Emergency Department today for evaluation of swelling to your left eye. based on our evaluation it appears that you are stable for discharge.      You have a stye on her left eye.  This is likely inflammation due to a clogged duct.  It is very important that you apply warm compresses, 4 times daily for 10 minutes at a time.  You may also massage around the area to help with your symptoms.  You should avoid make-ups as this may make your symptoms worse.    It is important to obtain follow up with your primary care physician whenever you have been evaluated in the Emergency Department. Please call your doctor's office and schedule an appointment.     Return to the Emergency Department if your symptoms persist or worsen - especially if you experience chest pain, trouble breathing, inability to move your eye, loss of vision, blurry vision, double vision, any new or worsening symptoms.     Please return if you have any concerns and are unable to contact your PCP.     Thank you for allowing me to care for you today. Hope that you feel better soon.

## 2023-02-09 NOTE — ED Triage Notes (Signed)
Pt arrives via EMS for a stye to her left eye. States she has had the stye for the past month. Endorses headache and left eye pain.           Prehospital medications given: No

## 2023-02-12 ENCOUNTER — Ambulatory Visit: Payer: Self-pay | Admitting: Student in an Organized Health Care Education/Training Program

## 2023-02-19 ENCOUNTER — Encounter: Payer: Self-pay | Admitting: Student in an Organized Health Care Education/Training Program

## 2023-02-19 ENCOUNTER — Emergency Department
Admission: EM | Admit: 2023-02-19 | Discharge: 2023-02-19 | Disposition: A | Discharge status: Home / Self Care | Payer: Medicaid Other | Source: Ambulatory Visit | Attending: Student in an Organized Health Care Education/Training Program | Admitting: Student in an Organized Health Care Education/Training Program

## 2023-02-19 ENCOUNTER — Other Ambulatory Visit: Payer: Self-pay

## 2023-02-19 DIAGNOSIS — Z1159 Encounter for screening for other viral diseases: Secondary | ICD-10-CM | POA: Insufficient documentation

## 2023-02-19 DIAGNOSIS — Z5900 Homelessness unspecified: Secondary | ICD-10-CM

## 2023-02-19 DIAGNOSIS — Z20822 Contact with and (suspected) exposure to covid-19: Secondary | ICD-10-CM

## 2023-02-19 DIAGNOSIS — Z118 Encounter for screening for other infectious and parasitic diseases: Secondary | ICD-10-CM | POA: Insufficient documentation

## 2023-02-19 DIAGNOSIS — F17210 Nicotine dependence, cigarettes, uncomplicated: Secondary | ICD-10-CM

## 2023-02-19 DIAGNOSIS — J45901 Unspecified asthma with (acute) exacerbation: Secondary | ICD-10-CM | POA: Insufficient documentation

## 2023-02-19 DIAGNOSIS — R059 Cough, unspecified: Secondary | ICD-10-CM

## 2023-02-19 DIAGNOSIS — Z5902 Unsheltered unhousedness: Secondary | ICD-10-CM | POA: Insufficient documentation

## 2023-02-19 DIAGNOSIS — Z1152 Encounter for screening for COVID-19: Secondary | ICD-10-CM | POA: Insufficient documentation

## 2023-02-19 DIAGNOSIS — R000 Tachycardia, unspecified: Secondary | ICD-10-CM | POA: Insufficient documentation

## 2023-02-19 DIAGNOSIS — Z113 Encounter for screening for infections with a predominantly sexual mode of transmission: Secondary | ICD-10-CM | POA: Insufficient documentation

## 2023-02-19 LAB — POCT URINE PREGNANCY
Lot #: 732331
Preg Test,UR POC: NEGATIVE

## 2023-02-19 LAB — COVID/INFLUENZA A & B/RSV NAAT (PCR)
COVID-19 NAAT (PCR): NEGATIVE
Influenza A NAAT (PCR): NEGATIVE
Influenza B NAAT (PCR): NEGATIVE
RSV NAAT (PCR): NEGATIVE

## 2023-02-19 LAB — PERFORMING LAB

## 2023-02-19 MED ORDER — PREDNISONE 20 MG PO TABS *I*
40.0000 mg | ORAL_TABLET | Freq: Every day | ORAL | 0 refills | Status: AC
Start: 2023-02-19 — End: 2023-02-23
  Filled 2023-02-19: qty 8, 4d supply, fill #0

## 2023-02-19 MED ORDER — PREDNISONE 20 MG PO TABS *I*
40.0000 mg | ORAL_TABLET | Freq: Once | ORAL | Status: AC
Start: 2023-02-19 — End: 2023-02-19
  Administered 2023-02-19: 40 mg via ORAL
  Filled 2023-02-19: qty 2

## 2023-02-19 NOTE — ED Triage Notes (Signed)
Pt arrives via EMS. Pt called for "asthma attack" requested breathing treatment which was provided. Pt in NAD. EMS report pt requesting speak to social work about housing insecurity.         Prehospital medications given: Yes  Respiratory/Allergic Reaction: Albuterol

## 2023-02-19 NOTE — ED Provider Notes (Signed)
History     Chief Complaint   Patient presents with   . Asthma   . Homeless     32 year old female with past history of substance use, asthma,, anxiety and depression presenting for evaluation of coughing and wheezing concern for asthma exacerbation, concern for sexually transmitted infection.  States she wants to have social work evaluation for housing issues.  Has been staying with her mom lately who she states is a "witch."  States that she is worried about partners recently who may have had STIs and asks for testing.  Also asks for pregnancy testing.  States her breathing felt better after the nebulizer treatment en route to hospital.      History provided by:  Patient and medical records        Medical/Surgical/Family History     Past Medical History:   Diagnosis Date   . Anxiety    . Asthma    . Depression    . Gestational diabetes    . Preeclampsia    . Scabies    . Substance abuse         Patient Active Problem List   Diagnosis Code   . Paranoia F22   . Scabies B86   . High-risk pregnancy O09.90   . PTSD (post-traumatic stress disorder) F43.10   . History of cesarean section complicating pregnancy O34.219   . H/O fetal anomaly in prior pregnancy, currently pregnant O09.299   . History of gestational diabetes in prior pregnancy, currently pregnant O09.299, Z59.32   . Hx of preeclampsia, prior pregnancy, currently pregnant O09.299   . Substance use disorder F19.90   . Homeless Z59.00   . Asthma during pregnancy O99.519, J45.909   . Trichomonas infection A59.9   . Staph aureus infection A49.01   . Rash of hands R21   . Suicidal ideation R45.851   . Rubella non-immune status, antepartum O09.899, Z28.39   . GBS (group B Streptococcus carrier), +RV culture, currently pregnant O99.820   . History of cesarean delivery Z98.891   . Complex care coordination Z71.89            Past Surgical History:   Procedure Laterality Date   . CESAREAN SECTION, LOW TRANSVERSE     . Left leg surgery     . TONSILLECTOMY AND  ADENOIDECTOMY            Social History     Tobacco Use   . Smoking status: Every Day     Packs/day: .5     Types: Cigarettes     Start date: 2002   . Smokeless tobacco: Former     Types: Snuff, Chew   Substance Use Topics   . Alcohol use: Not Currently     Comment: last drink unknown at this time   . Drug use: Yes     Types: Cocaine, Methamphetamines, IV, Heroin, Marijuana     Comment: admits to past hx of drug abuse             Review of Systems    Physical Exam     Triage Vitals  Triage Start: Start, (02/19/23 1346)  First Recorded BP: 132/82, Resp: 18, Temp: 35.9 C (96.6 F), Temp src: TEMPORAL Oxygen Therapy SpO2: 98 %, O2 Device: None (Room air), Heart Rate: 108, (02/19/23 1347)  .      Physical Exam  Vitals and nursing note reviewed.   Constitutional:       General: She is not in acute distress.  Appearance: Normal appearance. She is not ill-appearing or toxic-appearing.   HENT:      Head: Normocephalic and atraumatic.      Nose: Nose normal.      Mouth/Throat:      Mouth: Mucous membranes are moist.   Eyes:      Conjunctiva/sclera: Conjunctivae normal.   Cardiovascular:      Rate and Rhythm: Normal rate and regular rhythm.   Pulmonary:      Effort: Pulmonary effort is normal.   Abdominal:      General: Abdomen is flat. There is no distension.      Palpations: Abdomen is soft.      Tenderness: There is no abdominal tenderness. There is no guarding or rebound.   Musculoskeletal:      Cervical back: Normal range of motion and neck supple.   Skin:     General: Skin is warm and dry.      Capillary Refill: Capillary refill takes less than 2 seconds.      Findings: No rash.   Neurological:      General: No focal deficit present.      Mental Status: She is alert and oriented to person, place, and time.   Psychiatric:         Mood and Affect: Mood normal.         Behavior: Behavior normal.         Medical Decision Making   Patient seen by me on:  02/19/2023    Assessment:  This a 32 year old female with history  as above presenting for asthma exacerbation by EMS, received a DuoNeb en route to hospital.  Vital signs reassuring other than minimal tachycardia on arrival.  Well-appearing, nontoxic.  Nonlabored respirations with a very scant wheeze but good aeration, nonlabored, no prolonged expiratory phase.  She appears comfortable.  Will test for urine pregnancy and STIs at her request.  I asked her social work to evaluate her regarding her housing issues.  Anticipate probable discharge.  As of right now, no further nebulizer treatments but will plan to give steroids and send a prescription.           ED Course as of 02/19/23 1914   Thu Feb 19, 2023   1610 Negative pregnancy test.  Negative viral screen.  Will call if STI testing is positive.  Discussed plans, impression, return precautions and she verbalized understanding.  She is comfortable with discharge.       Charlene Brooke, MD            Charlene Brooke, MD  02/19/23 [phone]

## 2023-02-19 NOTE — Discharge Instructions (Addendum)
You were seen emergency department today for housing insecurity and asthma.  You were given a nebulizer treatment with the EMTs and a dose of steroids here which we will continue to treat your asthma for the next 4 days.  You will receive a call if your STI testing is positive in which case we will send you antibiotics.  Return for reevaluation if your breathing worsens, You become dizzy or very short of breath, or any additional new concerns.  You may return for reevaluation at any time.

## 2023-02-19 NOTE — Progress Notes (Signed)
Writer made aware that pt reported housing insecurity and homelessness and would like to speak to social work.     Writer met with pt in waiting room to discuss housing situation. Pt reports that she has been street homeless and does not want to go to a shelter. She reports she has anxiety and struggles living around other people. Pt has SSI and reports she has a rep-payee to assist with managing her income. Writer made pt aware that her only immediate options for housing needs would be to go to a hotel/motel for the night if she has income, or contact [facility] emergency housing for placement. Writer reported rarely does DHS will place people in motels. Pt agreed to have Writer contact DHS after hours for placement options. Pt also expressed interests in obtaining mental health resources. Writer made pt aware of [facility] for referrals and connection to OP treatment.     Automotive engineer for affordable motels in area, [employer], and [employer]. [employer]. Writer also contacted [facility] emergency housing after hours [phone] for shelter placement. DHS worker requested to speak to pt for screening. Writer met with pt at bedside and provided her the phone. Pt declined shelter placement. Writer made pt aware of her housing options. Pt reported to Writer that she will plan to go to reach out and go to [employer] [address] [place], [place] [address] [phone] after discharge.     Writer made provider aware.     Glynis Smiles, MSW  ED Social Worker  Pager # [phone]  5:42 PM 02/19/23

## 2023-02-20 ENCOUNTER — Other Ambulatory Visit: Payer: Self-pay

## 2023-02-20 LAB — N. GONORRHOEAE NAAT (PCR): N. gonorrhoeae NAAT (PCR): NEGATIVE

## 2023-02-20 LAB — TRICHOMONAS NAAT (PCR): Trichomonas NAAT (PCR): NEGATIVE

## 2023-02-20 LAB — CHLAMYDIA NAAT (PCR): Chlamydia NAAT (PCR): NEGATIVE

## 2023-02-23 ENCOUNTER — Other Ambulatory Visit: Payer: Self-pay

## 2023-02-23 ENCOUNTER — Emergency Department
Admission: EM | Admit: 2023-02-23 | Discharge: 2023-02-24 | Disposition: A | Discharge status: Home / Self Care | Payer: Medicaid Other | Source: Ambulatory Visit | Attending: Emergency Medicine | Admitting: Emergency Medicine

## 2023-02-23 ENCOUNTER — Encounter: Payer: Self-pay | Admitting: Emergency Medicine

## 2023-02-23 DIAGNOSIS — N760 Acute vaginitis: Secondary | ICD-10-CM | POA: Insufficient documentation

## 2023-02-23 DIAGNOSIS — R109 Unspecified abdominal pain: Secondary | ICD-10-CM

## 2023-02-23 DIAGNOSIS — Z113 Encounter for screening for infections with a predominantly sexual mode of transmission: Secondary | ICD-10-CM | POA: Insufficient documentation

## 2023-02-23 DIAGNOSIS — F17210 Nicotine dependence, cigarettes, uncomplicated: Secondary | ICD-10-CM

## 2023-02-23 LAB — VAGINITIS SCREEN: DNA PROBE: Vaginitis Screen:DNA Probe: 0

## 2023-02-23 NOTE — ED Provider Notes (Signed)
History     Chief Complaint   Patient presents with    Abdominal Pain     HPI  32 year old female with a history of anxiety, asthma, depression, chronic paranoia, presents to the emergency department for concern of a potential STD.  The patient states that she has noticed vaginal irritation and white discharge for an unknown period of time.  This has been associated with dysuria.  She denies concern for pregnancy.  She denies other acute concerns at this time.  She was noted by EMS to be responding to internal stimuli.  She denies any other acute complaints at this time.  She denies concerns for safety, suicidal ideation, homicidal ideations, or concern for acute psychosis.  She states she does have a safe place to stay and declined social work consultation.    Medical/Surgical/Family History     Past Medical History:   Diagnosis Date    Anxiety     Asthma     Depression     Gestational diabetes     Preeclampsia     Scabies     Substance abuse         Patient Active Problem List   Diagnosis Code    Paranoia F22    Scabies B86    High-risk pregnancy O09.90    PTSD (post-traumatic stress disorder) F43.10    History of cesarean section complicating pregnancy Q000111Q    H/O fetal anomaly in prior pregnancy, currently pregnant O09.299    History of gestational diabetes in prior pregnancy, currently pregnant O09.299, Z86.32    Hx of preeclampsia, prior pregnancy, currently pregnant O09.299    Substance use disorder F19.90    Homeless Z59.00    Asthma during pregnancy O99.519, J45.909    Trichomonas infection A59.9    Staph aureus infection A49.01    Rash of hands R21    Suicidal ideation R45.851    Rubella non-immune status, antepartum O09.899, Z28.39    GBS (group B Streptococcus carrier), +RV culture, currently pregnant O99.820    History of cesarean delivery Z98.891    Complex care coordination Z71.89            Past Surgical History:   Procedure Laterality Date    CESAREAN SECTION, LOW TRANSVERSE      Left leg  surgery      TONSILLECTOMY AND ADENOIDECTOMY            Social History     Tobacco Use    Smoking status: Every Day     Packs/day: .5     Types: Cigarettes     Start date: 2002    Smokeless tobacco: Former     Types: Snuff, Chew   Substance Use Topics    Alcohol use: Not Currently     Comment: last drink unknown at this time    Drug use: Yes     Types: Cocaine, Methamphetamines, IV, Heroin, Marijuana     Comment: admits to past hx of drug abuse             Review of Systems  See HPI  Physical Exam     Triage Vitals  Triage Start: Start, (02/23/23 1345)  First Recorded BP: 125/85, Resp: 18, Temp: 37 C (98.6 F), Temp src: TEMPORAL Oxygen Therapy SpO2: 97 %, Oximetry Source: Rt Hand, O2 Device: None (Room air), Heart Rate: 73, (02/23/23 1347)  .  First Pain Reported  0-10 Scale: 5, Pain Location/Orientation: Abdomen, (02/23/23 1347)  Physical Exam  Vitals and nursing note reviewed.   Constitutional:       General: She is not in acute distress.     Appearance: She is well-developed. She is not diaphoretic.   HENT:      Head: Normocephalic and atraumatic.   Eyes:      Conjunctiva/sclera: Conjunctivae normal.      Pupils: Pupils are equal, round, and reactive to light.   Cardiovascular:      Rate and Rhythm: Normal rate and regular rhythm.      Heart sounds: Normal heart sounds. No murmur heard.  Pulmonary:      Effort: Pulmonary effort is normal. No respiratory distress.      Breath sounds: Normal breath sounds.   Abdominal:      General: Abdomen is flat.      Palpations: Abdomen is soft.      Tenderness: There is no abdominal tenderness.   Musculoskeletal:         General: Normal range of motion.      Cervical back: Neck supple.   Lymphadenopathy:      Cervical: No cervical adenopathy.   Skin:     General: Skin is warm and dry.      Findings: No erythema or rash.   Neurological:      Mental Status: She is alert and oriented to person, place, and time.   Psychiatric:         Behavior: Behavior normal.          Thought Content: Thought content normal.         Judgment: Judgment normal.         Medical Decision Making   Patient seen by me on:  02/23/2023    Assessment:  Presenting symptoms with concern for potential sexually transmitted disease.  The patient obtained self swabs for gonorrhea, chlamydia, and vaginitis screening.  She was offered further evaluation including urinalysis and pregnancy test however she declined provide a sample after multiple attempts with nursing staff.  She was advised that if she has further concerns for infection she will need to undergo outpatient evaluation or return to the emergency department for further evaluation.  Given her minimal symptoms at this time empiric treatment is not indicated at this time.  The patient was counseled on signs of concern and reasons for return to the emergency department.    Plan:  Orders Placed This Encounter      Chlamydia NAAT (PCR)      N. gonorrhoeae NAAT (PCR)      Vaginitis screen: DNA probe      Urinalysis reflex to culture      Urinalysis with reflex to Microscopic UA and reflex to Bacterial Culture      POCT urine pregnancy        ED Course and Disposition:  The patient was discharged from the emergency department.              Isa Rankin, MD            Isa Rankin, MD  02/23/23 2352

## 2023-02-23 NOTE — ED Triage Notes (Signed)
Pt coming from home via EMS for RLQ pain, cough, and headache. Pt concerned for STD. Pt asked EMS "If this is the hospital with [employer] in it." Pt talking to herself at triage.      Denies drug/alcohol use.    Blood Glucose Meter (mg/dl): 96  Prehospital medications given: No

## 2023-02-23 NOTE — Discharge Instructions (Signed)
You were evaluated in the emergency department today for potential sexually transmitted diseases and vaginitis.  You were offered further evaluation including urinalysis however declined to provide a urine sample.  Testing is pending for potential infections and should additional treatment be necessary you will be contacted for treatment.  If you experience any acutely worsening symptoms please return to the emergency department for further evaluation.

## 2023-02-24 LAB — CHLAMYDIA NAAT (PCR): Chlamydia NAAT (PCR): NEGATIVE

## 2023-02-24 LAB — N. GONORRHOEAE NAAT (PCR): N. gonorrhoeae NAAT (PCR): NEGATIVE

## 2023-03-01 ENCOUNTER — Emergency Department: Payer: Medicaid Other

## 2023-03-01 ENCOUNTER — Encounter: Payer: Self-pay | Admitting: Emergency Medicine

## 2023-03-01 ENCOUNTER — Encounter: Payer: Self-pay | Admitting: Gastroenterology

## 2023-03-01 ENCOUNTER — Emergency Department
Admission: EM | Admit: 2023-03-01 | Discharge: 2023-03-01 | Disposition: A | Discharge status: Home / Self Care | Payer: Medicaid Other | Source: Ambulatory Visit | Attending: Emergency Medicine | Admitting: Emergency Medicine

## 2023-03-01 DIAGNOSIS — Z789 Other specified health status: Secondary | ICD-10-CM

## 2023-03-01 DIAGNOSIS — R0602 Shortness of breath: Secondary | ICD-10-CM

## 2023-03-01 DIAGNOSIS — R059 Cough, unspecified: Secondary | ICD-10-CM

## 2023-03-01 DIAGNOSIS — J45901 Unspecified asthma with (acute) exacerbation: Secondary | ICD-10-CM | POA: Insufficient documentation

## 2023-03-01 DIAGNOSIS — F17210 Nicotine dependence, cigarettes, uncomplicated: Secondary | ICD-10-CM

## 2023-03-01 LAB — CBC AND DIFFERENTIAL
Baso # K/uL: 0 10*3/uL (ref 0.0–0.2)
Basophil %: 0.7 %
Eos # K/uL: 1.2 10*3/uL — ABNORMAL HIGH (ref 0.0–0.5)
Eosinophil %: 21.5 %
Hematocrit: 37 % (ref 34–49)
Hemoglobin: 12.3 g/dL (ref 11.2–16.0)
IMM Granulocytes #: 0 10*3/uL (ref 0.0–0.0)
IMM Granulocytes: 0.2 %
Lymph # K/uL: 1.8 10*3/uL (ref 1.0–5.0)
Lymphocyte %: 32.4 %
MCH: 31 pg (ref 26–32)
MCHC: 33 g/dL (ref 32–36)
MCV: 94 fL (ref 75–100)
Mono # K/uL: 0.8 10*3/uL (ref 0.1–1.0)
Monocyte %: 14.3 %
Neut # K/uL: 1.7 10*3/uL (ref 1.5–6.5)
Nucl RBC # K/uL: 0 10*3/uL (ref 0.0–0.0)
Nucl RBC %: 0 /100 WBC (ref 0.0–0.2)
Platelets: 199 10*3/uL (ref 150–450)
RBC: 3.9 MIL/uL — ABNORMAL LOW (ref 4.0–5.5)
RDW: 12.8 % (ref 0.0–15.0)
Seg Neut %: 30.9 %
WBC: 5.5 10*3/uL (ref 3.5–11.0)

## 2023-03-01 LAB — BASIC METABOLIC PANEL
Anion Gap: 11 (ref 7–16)
CO2: 24 mmol/L (ref 20–28)
Calcium: 9.1 mg/dL (ref 8.8–10.2)
Chloride: 106 mmol/L (ref 96–108)
Creatinine: 0.73 mg/dL (ref 0.51–0.95)
Glucose: 93 mg/dL (ref 60–99)
Lab: 16 mg/dL (ref 6–20)
Potassium: 4 mmol/L (ref 3.3–5.1)
Sodium: 141 mmol/L (ref 133–145)
eGFR BY CREAT: 112 *

## 2023-03-01 MED ORDER — SODIUM CHLORIDE 0.9 % FLUSH FOR PUMPS *I*
0.0000 mL/h | INTRAVENOUS | Status: DC | PRN
Start: 2023-03-01 — End: 2023-03-02

## 2023-03-01 MED ORDER — ALBUTEROL SULFATE HFA 108 (90 BASE) MCG/ACT IN AERS *I*
1.0000 | INHALATION_SPRAY | Freq: Once | RESPIRATORY_TRACT | Status: AC
Start: 2023-03-01 — End: 2023-03-01
  Administered 2023-03-01: 1 via RESPIRATORY_TRACT
  Filled 2023-03-01: qty 6.7

## 2023-03-01 MED ORDER — DEXAMETHASONE 4 MG PO TABS *I*
10.0000 mg | ORAL_TABLET | Freq: Once | ORAL | Status: AC
Start: 2023-03-01 — End: 2023-03-01
  Administered 2023-03-01: 10 mg via ORAL
  Filled 2023-03-01: qty 3

## 2023-03-01 MED ORDER — IPRATROPIUM-ALBUTEROL 0.5-2.5 MG/3ML IN SOLN *I*
9.0000 mL | Freq: Once | RESPIRATORY_TRACT | Status: AC
Start: 2023-03-01 — End: 2023-03-01
  Administered 2023-03-01: 9 mL via RESPIRATORY_TRACT
  Filled 2023-03-01: qty 9

## 2023-03-01 MED ORDER — DEXTROSE 5 % FLUSH FOR PUMPS *I*
0.0000 mL/h | INTRAVENOUS | Status: DC | PRN
Start: 2023-03-01 — End: 2023-03-02

## 2023-03-01 NOTE — ED Provider Notes (Signed)
History     Chief Complaint   Patient presents with    Shortness of Breath     CC:  SOB      HPI:    32 year old female with history of asthma and mental health disease presents with complaint of shortness of breath.  She states that she has had a cough and ran out of her albuterol inhaler.  She has a history of frequent ED visits.  She denies fevers or chills.  She denies sputum production.  She is repeatedly requesting to boxed lunches.        History provided by:  Patient  History limited by:  Psychiatric disorder        Medical/Surgical/Family History     Past Medical History:   Diagnosis Date    Anxiety     Asthma     Depression     Gestational diabetes     Preeclampsia     Scabies     Substance abuse         Patient Active Problem List   Diagnosis Code    Paranoia F22    Scabies B86    High-risk pregnancy O09.90    PTSD (post-traumatic stress disorder) F43.10    History of cesarean section complicating pregnancy Q000111Q    H/O fetal anomaly in prior pregnancy, currently pregnant O09.299    History of gestational diabetes in prior pregnancy, currently pregnant O09.299, Z86.32    Hx of preeclampsia, prior pregnancy, currently pregnant O09.299    Substance use disorder F19.90    Homeless Z59.00    Asthma during pregnancy O99.519, J45.909    Trichomonas infection A59.9    Staph aureus infection A49.01    Rash of hands R21    Suicidal ideation R45.851    Rubella non-immune status, antepartum O09.899, Z28.39    GBS (group B Streptococcus carrier), +RV culture, currently pregnant O99.820    History of cesarean delivery Z98.891    Complex care coordination Z71.89            Past Surgical History:   Procedure Laterality Date    CESAREAN SECTION, LOW TRANSVERSE      Left leg surgery      TONSILLECTOMY AND ADENOIDECTOMY            Social History     Tobacco Use    Smoking status: Every Day     Packs/day: .5     Types: Cigarettes     Start date: 2002    Smokeless tobacco: Former     Types: Snuff, Chew   Substance Use  Topics    Alcohol use: Not Currently     Comment: last drink unknown at this time    Drug use: Yes     Types: Cocaine, Methamphetamines, IV, Heroin, Marijuana     Comment: admits to past hx of drug abuse             Review of Systems   Constitutional:  Negative for chills and fever.   Respiratory:  Positive for cough and wheezing.    Cardiovascular:  Negative for chest pain.       Physical Exam     Triage Vitals  Triage Start: Start, (03/01/23 1700)  First Recorded BP: 122/77, Resp: 20, Temp: 36.8 C (98.2 F), Temp src: TEMPORAL Oxygen Therapy SpO2: 99 %, O2 Device: None (Room air), Heart Rate: 95, (03/01/23 1701)  .  First Pain Reported  0-10 Scale: 0, (03/01/23 1701)  Physical Exam  Vitals reviewed.   Constitutional:       Appearance: She is well-developed.      Comments: 99% on RA   HENT:      Head: Normocephalic.      Nose: Nose normal.   Eyes:      Pupils: Pupils are equal, round, and reactive to light.   Cardiovascular:      Rate and Rhythm: Normal rate and regular rhythm.   Pulmonary:      Effort: Pulmonary effort is normal.      Breath sounds: Wheezing (scattered) present.   Abdominal:      General: There is no distension.      Palpations: Abdomen is soft.      Tenderness: There is no abdominal tenderness.   Musculoskeletal:         General: Normal range of motion.      Cervical back: Normal range of motion and neck supple.   Skin:     General: Skin is warm.   Neurological:      General: No focal deficit present.      Mental Status: She is alert.   Psychiatric:         Mood and Affect: Mood normal.         Medical Decision Making   Patient seen by me on:  03/01/2023    Assessment:  [name] Furey is a 32 y.o. female here with cough and shortness of breath with wheezing    Differential diagnosis:  Asthma exacerbation  Doubt bacterial pneumonia  Viral URI  No signs of respiratory failure    Plan:  Orders Placed This Encounter      *Chest standard frontal and lateral views      CBC and differential       Basic metabolic panel      Patient may eat      Insert peripheral IV        EKG Interpretation:  Normal sinus rhythm, high QRS voltage, NSSTT changes, tracing reviewed by myself    Review of existing & external labs / records: labs reviewed and reassuring    Other MDM elements: Social determinants of health: Poor health literacy, potential issues with compliance.  Will prescribe Decadron instead of prednisone as this has a longer duration of action        *Chest standard frontal and lateral views   Final Result      No acute disease in the chest.      END OF IMPRESSION                  [facility] submits this DICOM format image data and final report to the [facility], an independent secure electronic health information exchange, on a reciprocally searchable basis (with patient authorization) for a minimum of 12 months after exam    date.                    Lab results: 03/01/23  1751   WBC 5.5   Hemoglobin 12.3   Hematocrit 37   RBC 3.9*   Platelets 199   Seg Neut % 30.9   Lymphocyte % 32.4   Monocyte % 14.3   Eosinophil % 21.5   Basophil % 0.7           Lab results: 03/01/23  1751 10/14/22  1232 05/30/22  1445 05/04/22  1114   Sodium 141 142 142 138   Potassium 4.0 4.5 4.2  4.2   Chloride 106 107 107 103   CO2 '24 28 21 22   '$ UN '16 15 15 '$ 21*   Creatinine 0.73 1.03* 0.74  0.74 0.69   Glucose 93 82 80 99   Calcium 9.1 9.2 9.1 8.9   Total Protein  --  7.9* 7.4 6.4   Albumin  --  3.9 4.4 3.8   ALT  --  '12 12 13   '$ AST  --  '21 15 19   '$ Alk Phos  --  91 73 68   Bilirubin,Total  --  0.3 0.6 0.3           03/01/2023 7:32 PM:.   I have ordered an albuterol MDI, so that she can be provided with an inhaler prior to her discharge.  This is preferred over a prescription as I am uncertain that she will be able to get the prescription filled.  I will also provide a dose of Decadron, and this is preferred over a course of prednisone because again she may not get the prescription filled.  She is otherwise stable and appropriate for  discharge          Dx:  Asthma exacerbation            Rica Koyanagi, MD            Rica Koyanagi, MD  03/01/23 [phone]

## 2023-03-01 NOTE — ED Notes (Signed)
Pt repeatedly asking for food, talking in full sentences. Pt states "I'm dying. I missed dinner. I need two sandwiches." This RN repeatedly told pt that she needs to be evaluated by provider prior to being able to eat.

## 2023-03-01 NOTE — ED Triage Notes (Signed)
Pt arrives via EMS. C/o worsening shortness of breath since 1400 today when she was coughing. States her inhaler is empty.         Prehospital medications given: Yes  Respiratory/Allergic Reaction: DuoNeb

## 2023-03-01 NOTE — ED Notes (Signed)
Pt left via wheelchair with transport for chest x-ray. EMS nasal cannula present, not connected to O2. SpO2 96% on RA. Pt arrived back from chest x-ray, nasal cannula connected to portal O2 tank, 2L NC. When asked who gave pt O2 tank, pt states "The doctor gave it to me." No MD/OD assigned to pt at this time. Attempted to remove nasal cannula and O2 tank. Pt states "I need that. You can't remove it. You're rude."

## 2023-03-01 NOTE — Discharge Instructions (Signed)
Albuterol inhaler: 2 puffs every 6 hours as needed for cough and wheezing        Return to Emergency Department if you experience worsening or changing symptoms.    Follow-up with your primary care doctor if symptoms persist.    If you were given narcotic pain medication today or any medication that made you feel drowsy, do not drive or operate machinery for 24 hours.

## 2023-03-01 NOTE — ED Notes (Signed)
Pt attempted to leave facility with IV in place and oxygen tank in hand. Security and RN stopped Pt, and advised she cannot leave with IV and tank. Pt stated she would return to chair.

## 2023-03-02 ENCOUNTER — Other Ambulatory Visit: Payer: Self-pay

## 2023-03-14 ENCOUNTER — Other Ambulatory Visit: Payer: Self-pay

## 2023-03-14 ENCOUNTER — Encounter: Payer: Self-pay | Admitting: Emergency Medicine

## 2023-03-14 ENCOUNTER — Emergency Department: Payer: Medicaid Other

## 2023-03-14 ENCOUNTER — Emergency Department
Admission: EM | Admit: 2023-03-14 | Discharge: 2023-03-14 | Disposition: A | Discharge status: Home / Self Care | Payer: Medicaid Other | Source: Ambulatory Visit | Attending: Emergency Medicine | Admitting: Emergency Medicine

## 2023-03-14 DIAGNOSIS — Y9389 Activity, other specified: Secondary | ICD-10-CM | POA: Insufficient documentation

## 2023-03-14 DIAGNOSIS — S81802A Unspecified open wound, left lower leg, initial encounter: Secondary | ICD-10-CM

## 2023-03-14 DIAGNOSIS — X58XXXA Exposure to other specified factors, initial encounter: Secondary | ICD-10-CM | POA: Insufficient documentation

## 2023-03-14 DIAGNOSIS — S71102A Unspecified open wound, left thigh, initial encounter: Secondary | ICD-10-CM | POA: Insufficient documentation

## 2023-03-14 DIAGNOSIS — Y998 Other external cause status: Secondary | ICD-10-CM | POA: Insufficient documentation

## 2023-03-14 DIAGNOSIS — Y9289 Other specified places as the place of occurrence of the external cause: Secondary | ICD-10-CM | POA: Insufficient documentation

## 2023-03-14 DIAGNOSIS — R0602 Shortness of breath: Secondary | ICD-10-CM | POA: Insufficient documentation

## 2023-03-14 MED ORDER — ALBUTEROL SULFATE HFA 108 (90 BASE) MCG/ACT IN AERS *I*
2.0000 | INHALATION_SPRAY | Freq: Once | RESPIRATORY_TRACT | Status: AC
Start: 2023-03-14 — End: 2023-03-14
  Administered 2023-03-14: 2 via RESPIRATORY_TRACT
  Filled 2023-03-14: qty 6.7

## 2023-03-14 NOTE — Discharge Instructions (Signed)
You need to follow up with your primary care physician, please call within 48 hours to schedule an appointment to be seen in 3-5 days.     You can use the albuterol inhaler- 2 puffs every 4-6 hours as needed for SOB/cough for the next week.    Apply antibiotic ointment to wounds on your legs and abdomen.

## 2023-03-14 NOTE — ED Triage Notes (Signed)
Pt to the ED via EMS for "shortness of breath since birth". Patient endorsing cough nausea vomiting and is concerned that she may be pregnant. Lung sounds clear per EMS.

## 2023-03-14 NOTE — ED Provider Notes (Signed)
History     Chief Complaint   Patient presents with    Shortness of Breath     Per patient with shortness of breath, cough ongoing.  She reports that she has a history of asthma but does not currently have an inhaler.  She has multiple other ED visits for similar presentations.  She answers yes to almost every review of systems question I asked.  She is also concerned that she needs a dose of Rocephin for an infection and some wounds on her legs and lower abdomen.  She tells me that getting the Rocephin would give her "peace of mind".      History provided by:  Patient and medical records        Medical/Surgical/Family History     Past Medical History:   Diagnosis Date    Anxiety     Asthma     Depression     Gestational diabetes     Preeclampsia     Scabies     Substance abuse         Patient Active Problem List   Diagnosis Code    Paranoia F22    Scabies B86    High-risk pregnancy O09.90    PTSD (post-traumatic stress disorder) F43.10    History of cesarean section complicating pregnancy O34.219    H/O fetal anomaly in prior pregnancy, currently pregnant O09.299    History of gestational diabetes in prior pregnancy, currently pregnant O09.299, Z86.32    Hx of preeclampsia, prior pregnancy, currently pregnant O09.299    Substance use disorder F19.90    Homeless Z59.00    Asthma during pregnancy O99.519, J45.909    Trichomonas infection A59.9    Staph aureus infection A49.01    Rash of hands R21    Suicidal ideation R45.851    Rubella non-immune status, antepartum O09.899, Z28.39    GBS (group B Streptococcus carrier), +RV culture, currently pregnant O99.820    History of cesarean delivery Z98.891    Complex care coordination Z71.89            Past Surgical History:   Procedure Laterality Date    CESAREAN SECTION, LOW TRANSVERSE      Left leg surgery      TONSILLECTOMY AND ADENOIDECTOMY            Social History     Tobacco Use    Smoking status: Every Day     Packs/day: .5     Types: Cigarettes     Start date:  2002    Smokeless tobacco: Former     Types: Snuff, Chew   Substance Use Topics    Alcohol use: Not Currently     Comment: last drink unknown at this time    Drug use: Yes     Types: Cocaine, Methamphetamines, IV, Heroin, Marijuana     Comment: admits to past hx of drug abuse             Review of Systems   Unable to perform ROS: Psychiatric disorder   Constitutional:  Negative for fever.   Respiratory:  Positive for shortness of breath.    Skin:  Positive for rash and wound.   Psychiatric/Behavioral:  Negative for confusion.        Physical Exam     Triage Vitals  Triage Start: Start, (03/14/23 0030)  First Recorded BP: 164/82, Resp: 18, Temp: 36.2 C (97.2 F) Oxygen Therapy SpO2: 98 %, O2 Device: None (Room air), Heart Rate:  102, (03/14/23 0032)  .  First Pain Reported  0-10 Scale: 0, Pain Location/Orientation: Leg Left (at site of blisters), (03/14/23 0032)       Physical Exam  Vitals and nursing note reviewed.   Constitutional:       Appearance: She is well-developed.      Comments: Disheveled    Cardiovascular:      Rate and Rhythm: Normal rate and regular rhythm.   Pulmonary:      Effort: Pulmonary effort is normal.      Comments: Few scattered wheezes   Musculoskeletal:         General: Normal range of motion.   Skin:     General: Skin is dry.      Comments: Multiple open areas on anterior upper thigh and abd in various stages of healing  No erythema or drainage    Neurological:      Mental Status: She is alert.   Psychiatric:         Behavior: Behavior normal.         Medical Decision Making   Patient seen by me on:  03/14/2023    Assessment:  [name] is a 32 y.o. female  With wounds, SOB     Differential diagnosis:  RAD  Doubt abscess or cellulitis     Plan:  Will give topical antibiotic ointment for her wounds, albuterol inhaler for her wheezing and shortness of breath.  Chest x-ray within normal limits.  Discharge to home.    Review of existing & external labs / records: yes    Independent  interpretation of imaging: yes    ED Course and Disposition:  *Chest standard frontal and lateral views    (Results Pending)                Vinette Crites, DO            Hermann Dottavio, DO  03/14/23 0400

## 2023-03-25 ENCOUNTER — Emergency Department
Admission: EM | Admit: 2023-03-25 | Discharge: 2023-03-25 | Disposition: A | Discharge status: Home / Self Care | Payer: Medicaid Other | Source: Ambulatory Visit | Attending: Emergency Medicine | Admitting: Emergency Medicine

## 2023-03-25 ENCOUNTER — Encounter: Payer: Self-pay | Admitting: Emergency Medicine

## 2023-03-25 ENCOUNTER — Other Ambulatory Visit: Payer: Self-pay

## 2023-03-25 DIAGNOSIS — J45909 Unspecified asthma, uncomplicated: Secondary | ICD-10-CM | POA: Insufficient documentation

## 2023-03-25 DIAGNOSIS — F17210 Nicotine dependence, cigarettes, uncomplicated: Secondary | ICD-10-CM | POA: Insufficient documentation

## 2023-03-25 MED ORDER — IPRATROPIUM-ALBUTEROL 0.5-2.5 MG/3ML IN SOLN *I*
3.0000 mL | Freq: Once | RESPIRATORY_TRACT | Status: AC
Start: 2023-03-25 — End: 2023-03-25
  Administered 2023-03-25: 3 mL via RESPIRATORY_TRACT
  Filled 2023-03-25: qty 3

## 2023-03-25 MED ORDER — ALBUTEROL SULFATE 0.63 MG/3ML IN NEBU *I*
1.0000 | INHALATION_SOLUTION | Freq: Four times a day (QID) | RESPIRATORY_TRACT | 0 refills | Status: AC | PRN
Start: 2023-03-25 — End: 2023-04-08

## 2023-03-25 MED ORDER — ALBUTEROL SULFATE HFA 108 (90 BASE) MCG/ACT IN AERS *I*
1.0000 | INHALATION_SPRAY | Freq: Once | RESPIRATORY_TRACT | Status: AC
Start: 2023-03-25 — End: 2023-03-25
  Administered 2023-03-25: 1 via RESPIRATORY_TRACT
  Filled 2023-03-25: qty 6.7

## 2023-03-25 MED ORDER — NEBULIZER/TUBING/MOUTHPIECE KIT *A*
PACK | 0 refills | Status: DC
Start: 2023-03-25 — End: 2023-06-19

## 2023-03-25 NOTE — ED Provider Notes (Signed)
History     Chief Complaint   Patient presents with    Shortness of Breath     32 year old female with PMH of anxiety, asthma, depression presents to the ED for evaluation of dyspnea.    Patient reports that for the past day she has has been having trouble breathing.  Endorses a cough and wheezing.  She is requesting an albuterol inhaler and nebulizer device    Denies fever      History provided by:  Patient  Language interpreter used: No          Medical/Surgical/Family History     Past Medical History:   Diagnosis Date    Anxiety     Asthma     Depression     Gestational diabetes     Preeclampsia     Scabies     Substance abuse         Patient Active Problem List   Diagnosis Code    Paranoia F22    Scabies B86    High-risk pregnancy O09.90    PTSD (post-traumatic stress disorder) F43.10    History of cesarean section complicating pregnancy O34.219    H/O fetal anomaly in prior pregnancy, currently pregnant O09.299    History of gestational diabetes in prior pregnancy, currently pregnant O09.299, Z86.32    Hx of preeclampsia, prior pregnancy, currently pregnant O09.299    Substance use disorder F19.90    Homeless Z59.00    Asthma during pregnancy O99.519, J45.909    Trichomonas infection A59.9    Staph aureus infection A49.01    Rash of hands R21    Suicidal ideation R45.851    Rubella non-immune status, antepartum O09.899, Z28.39    GBS (group B Streptococcus carrier), +RV culture, currently pregnant O99.820    History of cesarean delivery Z98.891    Complex care coordination Z71.89            Past Surgical History:   Procedure Laterality Date    CESAREAN SECTION, LOW TRANSVERSE      Left leg surgery      TONSILLECTOMY AND ADENOIDECTOMY            Social History     Tobacco Use    Smoking status: Every Day     Packs/day: .5     Types: Cigarettes     Start date: 2002    Smokeless tobacco: Former     Types: Snuff, Chew   Substance Use Topics    Alcohol use: Not Currently     Comment: last drink unknown at this  time    Drug use: Yes     Types: Cocaine, Methamphetamines, IV, Heroin, Marijuana     Comment: admits to past hx of drug abuse             Review of Systems    Physical Exam     Triage Vitals  Triage Start: Start, (03/25/23 0454)  First Recorded BP: 118/80, Resp: 20, Temp: 37.1 C (98.8 F) Oxygen Therapy SpO2: 99 %, O2 Device: None (Room air), Heart Rate: 69, (03/25/23 0455)  .  First Pain Reported  0-10 Scale: 0, (03/25/23 0455)       Physical Exam  Vitals and nursing note reviewed.   Constitutional:       Appearance: Normal appearance. She is normal weight.   HENT:      Head: Normocephalic and atraumatic.      Right Ear: External ear normal.      Left  Ear: External ear normal.      Nose: Nose normal.      Mouth/Throat:      Mouth: Mucous membranes are moist.   Eyes:      Extraocular Movements: Extraocular movements intact.      Conjunctiva/sclera: Conjunctivae normal.   Cardiovascular:      Rate and Rhythm: Normal rate.   Pulmonary:      Effort: Pulmonary effort is normal.      Comments: Expiratory wheezes.  No excessive muscle use.  No tachypnea.  Breathing easily.  No rales  Abdominal:      General: Abdomen is flat.   Musculoskeletal:         General: Normal range of motion.      Cervical back: Normal range of motion.   Skin:     General: Skin is warm and dry.   Neurological:      General: No focal deficit present.      Mental Status: She is alert.         Medical Decision Making   Patient seen by me on:  03/25/2023    Assessment:  32 year old female with PMH of anxiety, asthma, depression mostly with dyspnea, cough and wheezing x 1 day.    Differential diagnosis:  Asthma exacerbation, URI, bronchitis    Plan:  No orders of the defined types were placed in this encounter.    Medications  ipratropium-albuterol (DUONEB) 0.5-2.5mg  /22mL nebulization solution 3 mL (3 mLs Nebulization Given 03/25/23 0631)  albuterol HFA (PROVENTIL, VENTOLIN) inhaler 1 puff (1 puff Inhalation Given 03/25/23 0631)      ED Course and  Disposition:  Patient given the above medications and discharged home with prescription for albuterol nebulizer device.  Return precaution discussed.  Patient agreeable with plan.              Madalyn Rob, PA            Madalyn Rob, [place]  03/25/23 [phone]

## 2023-03-25 NOTE — ED Notes (Signed)
Pt states she has been using her inhaler and has run out of her medication.

## 2023-03-25 NOTE — Discharge Instructions (Signed)
Return to the ED if you develop worsening trouble breathing, fevers, chest pain
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
## 2023-04-08 ENCOUNTER — Encounter: Payer: Self-pay | Admitting: Emergency Medicine

## 2023-04-08 ENCOUNTER — Other Ambulatory Visit: Payer: Self-pay

## 2023-04-08 ENCOUNTER — Emergency Department
Admission: EM | Admit: 2023-04-08 | Discharge: 2023-04-08 | Disposition: A | Discharge status: Home / Self Care | Payer: Medicaid Other | Source: Ambulatory Visit | Attending: Emergency Medicine | Admitting: Emergency Medicine

## 2023-04-08 DIAGNOSIS — Y998 Other external cause status: Secondary | ICD-10-CM | POA: Insufficient documentation

## 2023-04-08 DIAGNOSIS — B86 Scabies: Secondary | ICD-10-CM | POA: Insufficient documentation

## 2023-04-08 DIAGNOSIS — T7621XA Adult sexual abuse, suspected, initial encounter: Secondary | ICD-10-CM | POA: Insufficient documentation

## 2023-04-08 DIAGNOSIS — Y9289 Other specified places as the place of occurrence of the external cause: Secondary | ICD-10-CM | POA: Insufficient documentation

## 2023-04-08 DIAGNOSIS — T7421XA Adult sexual abuse, confirmed, initial encounter: Secondary | ICD-10-CM

## 2023-04-08 DIAGNOSIS — Z23 Encounter for immunization: Secondary | ICD-10-CM | POA: Insufficient documentation

## 2023-04-08 DIAGNOSIS — Y9389 Activity, other specified: Secondary | ICD-10-CM | POA: Insufficient documentation

## 2023-04-08 DIAGNOSIS — X58XXXA Exposure to other specified factors, initial encounter: Secondary | ICD-10-CM | POA: Insufficient documentation

## 2023-04-08 LAB — COMPREHENSIVE METABOLIC PANEL
ALT: 12 U/L (ref 0–35)
AST: 20 U/L (ref 0–35)
Albumin: 4.2 g/dL (ref 3.5–5.2)
Alk Phos: 77 U/L (ref 35–105)
Anion Gap: 12 (ref 7–16)
Bilirubin,Total: 0.5 mg/dL (ref 0.0–1.2)
CO2: 23 mmol/L (ref 20–28)
Calcium: 8.9 mg/dL (ref 8.8–10.2)
Chloride: 104 mmol/L (ref 96–108)
Creatinine: 0.68 mg/dL (ref 0.51–0.95)
Glucose: 89 mg/dL (ref 60–99)
Lab: 14 mg/dL (ref 6–20)
Potassium: 4 mmol/L (ref 3.3–5.1)
Sodium: 139 mmol/L (ref 133–145)
Total Protein: 7 g/dL (ref 6.3–7.7)
eGFR BY CREAT: 119 *

## 2023-04-08 LAB — AEROBIC CULTURE: Aerobic Culture: 0

## 2023-04-08 LAB — CBC AND DIFFERENTIAL
Baso # K/uL: 0 10*3/uL (ref 0.0–0.2)
Eos # K/uL: 0.8 10*3/uL — ABNORMAL HIGH (ref 0.0–0.5)
Hematocrit: 40 % (ref 34–49)
Hemoglobin: 13.5 g/dL (ref 11.2–16.0)
IMM Granulocytes #: 0 10*3/uL (ref 0.0–0.0)
IMM Granulocytes: 0.3 %
Lymph # K/uL: 2.3 10*3/uL (ref 1.0–5.0)
MCV: 95 fL (ref 75–100)
Mono # K/uL: 0.6 10*3/uL (ref 0.1–1.0)
Neut # K/uL: 2.2 10*3/uL (ref 1.5–6.5)
Nucl RBC # K/uL: 0 10*3/uL (ref 0.0–0.0)
Nucl RBC %: 0 /100 WBC (ref 0.0–0.2)
Platelets: 228 10*3/uL (ref 150–450)
RBC: 4.2 MIL/uL (ref 4.0–5.5)
RDW: 12.8 % (ref 0.0–15.0)
Seg Neut %: 37.1 %
WBC: 5.9 10*3/uL (ref 3.5–11.0)

## 2023-04-08 LAB — PREGNANCY TEST, SERUM: Preg,Serum: NEGATIVE

## 2023-04-08 LAB — DRUGS OF ABUSE SCRN,URINE
Amphetamine,UR: NEGATIVE
Barbiturate,UR: NEGATIVE
Benzodiazepinen,UR: NEGATIVE
Cocaine/Metab,UR: POSITIVE — AB
Opiates,UR: NEGATIVE
PCP,UR: NEGATIVE
THC Metabolite,UR: NEGATIVE
Tricyclics,UR: NEGATIVE

## 2023-04-08 MED ORDER — CEFTRIAXONE SODIUM 500 MG IJ SOLR *I*
500.0000 mg | Freq: Once | INTRAMUSCULAR | Status: AC
Start: 2023-04-08 — End: 2023-04-08
  Administered 2023-04-08: 500 mg via INTRAMUSCULAR
  Filled 2023-04-08: qty 5

## 2023-04-08 MED ORDER — PERMETHRIN 5 % EX CREA *I*
TOPICAL_CREAM | Freq: Once | CUTANEOUS | 0 refills | Status: AC
Start: 2023-04-08 — End: 2023-04-14
  Filled 2023-04-08: qty 60, 1d supply, fill #0
  Filled 2023-04-09: qty 60, 3d supply, fill #0

## 2023-04-08 MED ORDER — ULIPRISTAL ACETATE 30 MG PO TABS *I*
30.0000 mg | ORAL_TABLET | Freq: Once | ORAL | Status: AC
Start: 2023-04-08 — End: 2023-04-08
  Administered 2023-04-08: 30 mg via ORAL
  Filled 2023-04-08 (×2): qty 1

## 2023-04-08 MED ORDER — PERMETHRIN 5 % EX CREA *I*
TOPICAL_CREAM | Freq: Once | CUTANEOUS | 0 refills | Status: DC
Start: 2023-04-08 — End: 2023-04-08

## 2023-04-08 MED ORDER — (HIV PEP KIT) DOLUTEGRAVIR SODIUM 50 MG PO TABS *I*
50.0000 mg | ORAL_TABLET | Freq: Every day | ORAL | Status: DC
Start: 2023-04-08 — End: 2023-04-09
  Administered 2023-04-08: 50 mg via ORAL
  Filled 2023-04-08: qty 1

## 2023-04-08 MED ORDER — AZITHROMYCIN 250 MG PO TABS *I*
1000.0000 mg | ORAL_TABLET | Freq: Once | ORAL | Status: AC
Start: 2023-04-08 — End: 2023-04-08
  Administered 2023-04-08: 1000 mg via ORAL
  Filled 2023-04-08: qty 4

## 2023-04-08 MED ORDER — TETANUS-DIPHTH-ACELL PERT 5-2.5-18.5 LF-MCG/0.5 IM SUSP *WRAPPED*
0.5000 mL | Freq: Once | INTRAMUSCULAR | Status: AC
Start: 2023-04-08 — End: 2023-04-08
  Administered 2023-04-08: 0.5 mL via INTRAMUSCULAR
  Filled 2023-04-08: qty 0.5

## 2023-04-08 MED ORDER — (HIV PEP KIT) EMTRICITABINE-TENOFOVIR 200-300 MG PO TABS *I*
1.0000 | ORAL_TABLET | Freq: Every day | ORAL | Status: DC
Start: 2023-04-08 — End: 2023-04-09
  Administered 2023-04-08: 1 via ORAL
  Filled 2023-04-08: qty 1
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
## 2023-04-09 ENCOUNTER — Other Ambulatory Visit: Payer: Self-pay

## 2023-04-09 ENCOUNTER — Emergency Department
Admission: EM | Admit: 2023-04-09 | Discharge: 2023-04-10 | Disposition: A | Discharge status: Home / Self Care | Payer: Medicaid Other | Source: Ambulatory Visit | Attending: Emergency Medicine | Admitting: Emergency Medicine

## 2023-04-09 DIAGNOSIS — F17210 Nicotine dependence, cigarettes, uncomplicated: Secondary | ICD-10-CM | POA: Insufficient documentation

## 2023-04-09 DIAGNOSIS — R21 Rash and other nonspecific skin eruption: Secondary | ICD-10-CM | POA: Insufficient documentation

## 2023-04-09 NOTE — ED Notes (Signed)
Patient came to triage requesting to update why she's being seen today. States she has had hand pain and shortness of breath and "needs a lot of medication".
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
## 2023-04-09 NOTE — ED Triage Notes (Addendum)
Pt arrives to ED from "[facility]" w/c/o rash all over body and hands. RN unable to see rash on hands.   Denies SI and HI.     Prehospital medications given: No

## 2023-04-10 ENCOUNTER — Encounter: Payer: Self-pay | Admitting: Emergency Medicine

## 2023-04-10 ENCOUNTER — Other Ambulatory Visit: Payer: Self-pay

## 2023-04-10 MED ORDER — PREDNISONE 20 MG PO TABS *I*
40.0000 mg | ORAL_TABLET | Freq: Every day | ORAL | 0 refills | Status: DC
Start: 2023-04-10 — End: 2023-04-10

## 2023-04-10 MED ORDER — PERMETHRIN 5 % EX CREA *I*
TOPICAL_CREAM | Freq: Once | CUTANEOUS | 0 refills | Status: AC
Start: 2023-04-10 — End: 2023-04-11
  Filled 2023-04-10: qty 60, fill #0

## 2023-04-10 MED ORDER — DIPHENHYDRAMINE HCL 25 MG ORAL SOLID *WRAPPED*
25.0000 mg | Freq: Four times a day (QID) | ORAL | 0 refills | Status: DC | PRN
Start: 2023-04-10 — End: 2023-04-10

## 2023-04-10 MED ORDER — DIPHENHYDRAMINE HCL 25 MG ORAL SOLID *WRAPPED*
25.0000 mg | Freq: Four times a day (QID) | ORAL | 0 refills | Status: DC | PRN
Start: 2023-04-10 — End: 2023-04-10
  Filled 2023-04-10: qty 28, 7d supply, fill #0

## 2023-04-10 MED ORDER — VALACYCLOVIR HCL 1000 MG PO TABS *I*
1000.0000 mg | ORAL_TABLET | Freq: Two times a day (BID) | ORAL | 0 refills | Status: AC
Start: 2023-04-10 — End: 2023-04-20
  Filled 2023-04-10: qty 14, 7d supply, fill #0

## 2023-04-10 MED ORDER — TRIAMCINOLONE ACETONIDE 0.1 % EX OINT *I*
TOPICAL_OINTMENT | Freq: Two times a day (BID) | CUTANEOUS | 0 refills | Status: DC
Start: 2023-04-10 — End: 2023-04-10

## 2023-04-10 MED ORDER — PREDNISONE 20 MG PO TABS *I*
40.0000 mg | ORAL_TABLET | Freq: Every day | ORAL | 0 refills | Status: AC
Start: 2023-04-10 — End: 2023-04-17
  Filled 2023-04-10: qty 8, 4d supply, fill #0

## 2023-04-10 MED ORDER — TRIAMCINOLONE ACETONIDE 0.1 % EX OINT *I*
TOPICAL_OINTMENT | Freq: Two times a day (BID) | CUTANEOUS | 0 refills | Status: DC
Start: 2023-04-10 — End: 2023-04-10
  Filled 2023-04-10: qty 15, 30d supply, fill #0

## 2023-04-10 MED ORDER — DOXYCYCLINE HYCLATE 100 MG PO CAPS *I*
100.0000 mg | ORAL_CAPSULE | Freq: Two times a day (BID) | ORAL | 0 refills | Status: AC
Start: 2023-04-10 — End: 2023-04-20
  Filled 2023-04-10: qty 14, 7d supply, fill #0

## 2023-04-10 NOTE — ED Notes (Signed)
Evaluated and D/C'd home by provider without further nursing intervention. D/C instructions provided; discussion of D/C plan and medications per provider.

## 2023-04-10 NOTE — Discharge Instructions (Addendum)
Encourage good hygiene. Apply permethrin cream as directed, take prednisone as directed.    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

## 2023-04-10 NOTE — ED Notes (Signed)
Pt pacing in waiting room, very anxious and restless, requesting gloves to cover her hands "because the skin is all falling off". On exam, skin intact, but right hand appears wrinkled and water saturated. Pt given towels and encouraged to keep skin clean and dry, instead of wet in nitrile glove. 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 Denies SI/HI.

## 2023-04-10 NOTE — ED Provider Notes (Addendum)
History     Chief Complaint   Patient presents with    Rash     Alexis [place] Becker is a 32 y.o. female who presents to ED due to concern for rash for the past 2 weeks. Reports rash primarily in bilateral hands.  Reports generalized pruritus.  Patient has been wearing gloves for 4 days and unclear if related.  Patient with history of scabies and rash of hands.  Recently evaluated at [facility] ED for STI.  No concerns for SI/HI. No oropharyngeal swelling, fever, chills or any other complaints at this time.      History provided by:  Patient  Language interpreter used: No        Medical/Surgical/Family History     Past Medical History:   Diagnosis Date    Anxiety     Asthma     Depression     Gestational diabetes     Preeclampsia     Scabies     Substance abuse         Patient Active Problem List   Diagnosis Code    Paranoia F22    Scabies B86    High-risk pregnancy O09.90    PTSD (post-traumatic stress disorder) F43.10    History of cesarean section complicating pregnancy O34.219    H/O fetal anomaly in prior pregnancy, currently pregnant O09.299    History of gestational diabetes in prior pregnancy, currently pregnant O09.299, Z86.32    Hx of preeclampsia, prior pregnancy, currently pregnant O09.299    Substance use disorder F19.90    Homeless Z59.00    Asthma during pregnancy O99.519, J45.909    Trichomonas infection A59.9    Staph aureus infection A49.01    Rash of hands R21    Suicidal ideation R45.851    Rubella non-immune status, antepartum O09.899, Z28.39    GBS (group B Streptococcus carrier), +RV culture, currently pregnant O99.820    History of cesarean delivery Z98.891    Complex care coordination Z71.89            Past Surgical History:   Procedure Laterality Date    CESAREAN SECTION, LOW TRANSVERSE      Left leg surgery      TONSILLECTOMY AND ADENOIDECTOMY            Social History     Tobacco Use    Smoking status: Every Day     Packs/day: .5     Types: Cigarettes     Start date: 2002    Smokeless tobacco:  Former     Types: Snuff, Chew   Substance Use Topics    Alcohol use: Not Currently     Comment: last drink unknown at this time    Drug use: Yes     Types: Cocaine, Methamphetamines, IV, Heroin, Marijuana     Comment: admits to past hx of drug abuse             Review of Systems   Skin:  Positive for rash.       Physical Exam     Triage Vitals  Triage Start: Start, (04/09/23 1642)  First Recorded BP: 121/70, Resp: 16, Temp: 36.2 C (97.2 F), Temp src: TEMPORAL Oxygen Therapy SpO2: 99 %, Oximetry Source: Lt Hand, O2 Device: None (Room air), Heart Rate: 78, (04/09/23 1646)  .  First Pain Reported  0-10 Scale: 9, Pain Location/Orientation: Generalized, (04/09/23 1646)       Physical Exam  Vitals and nursing note reviewed. Exam conducted with  a chaperone present.   Constitutional:       General: She is not in acute distress.     Appearance: She is well-developed. She is not ill-appearing, toxic-appearing or diaphoretic.   HENT:      Head: Normocephalic and atraumatic.        Mouth/Throat:      Pharynx: Oropharynx is clear. Uvula midline. No pharyngeal swelling, oropharyngeal exudate, posterior oropharyngeal erythema or uvula swelling.   Eyes:      Pupils: Pupils are equal, round, and reactive to light.   Cardiovascular:      Rate and Rhythm: Normal rate and regular rhythm.      Pulses: Normal pulses.      Heart sounds: Normal heart sounds.   Pulmonary:      Effort: Pulmonary effort is normal.      Breath sounds: Normal breath sounds.   Abdominal:      General: Bowel sounds are normal.      Palpations: Abdomen is soft.      Tenderness: There is no abdominal tenderness.   Musculoskeletal:         General: Normal range of motion.      Cervical back: Normal range of motion and neck supple.   Skin:     General: Skin is warm and dry.      Capillary Refill: Capillary refill takes less than 2 seconds.      Findings: Rash (primarily involving the interdigital webspaces) present. No abrasion, abscess, acne, bruising, burn,  ecchymosis, erythema, laceration, petechiae or wound. Rash is urticarial.   Neurological:      General: No focal deficit present.      Mental Status: She is alert and oriented to person, place, and time. Mental status is at baseline.               Medical Decision Making   Patient seen by me on:  04/09/2023    Assessment:  Alexis Vang is a 32 y.o. female who presents to ED due to concern for pruritic rash primarily involving the interdigital webs spaces over the past 2 weeks.    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    ED Course and Disposition:  Encourage good hygiene. Permethrin, steroid sent to pharmacy. 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 Patient discharged with PCP follow-up.  Patient educated on reasons to return to the ED.  Patient understands and agrees with plan.               Jenel Gierke Essex, PA                   [place], [place], [place]  04/10/23 [phone]

## 2023-04-12 ENCOUNTER — Emergency Department
Admission: EM | Admit: 2023-04-12 | Discharge: 2023-04-12 | Disposition: A | Discharge status: Home / Self Care | Payer: Medicaid Other | Source: Ambulatory Visit | Attending: Emergency Medicine | Admitting: Emergency Medicine

## 2023-04-12 ENCOUNTER — Other Ambulatory Visit: Payer: Self-pay

## 2023-04-12 DIAGNOSIS — R21 Rash and other nonspecific skin eruption: Secondary | ICD-10-CM

## 2023-04-12 DIAGNOSIS — L089 Local infection of the skin and subcutaneous tissue, unspecified: Secondary | ICD-10-CM | POA: Insufficient documentation

## 2023-04-12 MED ORDER — DOXYCYCLINE HYCLATE 100 MG PO TABS *I*
100.0000 mg | ORAL_TABLET | Freq: Once | ORAL | Status: AC
Start: 2023-04-12 — End: 2023-04-12
  Administered 2023-04-12: 100 mg via ORAL
  Filled 2023-04-12: qty 1

## 2023-04-12 NOTE — Discharge Instructions (Addendum)
The previous cultures obtained from and grew Staph aureus.  This needs to be treated with an antibiotic called doxycycline.  You were given your first dose here in the emergency department.  I have sent the remainder of the medication to the pharmacy.  It is important you pick up this medication and take it daily to help with your hand infection.

## 2023-04-12 NOTE — ED Provider Notes (Signed)
History     Chief Complaint   Patient presents with    Wound Infection    Care Management     Patient is a 32 year old female with a past medical history of substance abuse, scabies, asthma, paranoia who presents to the emergency department today with concerns for infections of both of her hands.  Patient reports that her hands have been bothering her for "a million years".  She has been wearing blue gloves on her hands for the last few days to see if this helps with the infection that she is developing.  She does report that she received a phone call but has not picked up her medications yet.    She also reports that a resident at her facility keeps letting animals loose in her rooms and she thinks one of them bit her.  Reports that the resident has access to the room because he is the owner of the facility.  She is unsure what kind of animal he keeps letting in her room but reports that it makes a bunch of noise.  She has never saw the animal and does not have any bite marks.      History provided by:  Patient and medical records  Language interpreter used: No          Medical/Surgical/Family History     Past Medical History:   Diagnosis Date    Anxiety     Asthma     Depression     Gestational diabetes     Preeclampsia     Scabies     Substance abuse         Patient Active Problem List   Diagnosis Code    Paranoia F22    Scabies B86    High-risk pregnancy O09.90    PTSD (post-traumatic stress disorder) F43.10    History of cesarean section complicating pregnancy O34.219    H/O fetal anomaly in prior pregnancy, currently pregnant O09.299    History of gestational diabetes in prior pregnancy, currently pregnant O09.299, Z86.32    Hx of preeclampsia, prior pregnancy, currently pregnant O09.299    Substance use disorder F19.90    Homeless Z59.00    Asthma during pregnancy O99.519, J45.909    Trichomonas infection A59.9    Staph aureus infection A49.01    Rash of hands R21    Suicidal ideation R45.851    Rubella  non-immune status, antepartum O09.899, Z28.39    GBS (group B Streptococcus carrier), +RV culture, currently pregnant O99.820    History of cesarean delivery Z98.891    Complex care coordination Z71.89            Past Surgical History:   Procedure Laterality Date    CESAREAN SECTION, LOW TRANSVERSE      Left leg surgery      TONSILLECTOMY AND ADENOIDECTOMY            Social History     Tobacco Use    Smoking status: Every Day     Packs/day: .5     Types: Cigarettes     Start date: 2002    Smokeless tobacco: Former     Types: Snuff, Chew   Substance Use Topics    Alcohol use: Not Currently     Comment: last drink unknown at this time    Drug use: Yes     Types: Cocaine, Methamphetamines, IV, Heroin, Marijuana     Comment: admits to past hx of drug abuse  Review of Systems   Constitutional:  Negative for chills and fever.   Respiratory:  Negative for cough and shortness of breath.    Cardiovascular:  Negative for chest pain and palpitations.   Skin:  Positive for rash and wound.       Physical Exam     Triage Vitals  Triage Start: Start, (04/12/23 1936)  First Recorded BP: 118/72, Resp: 16, Temp: 36.7 C (98.1 F) Oxygen Therapy SpO2: 98 %, O2 Device: None (Room air), Heart Rate: 82, (04/12/23 1936)  .  First Pain Reported  0-10 Scale: 7, Pain Location/Orientation: Hand Right;Hand Left, (04/12/23 1936)       Physical Exam    Medical Decision Making   Patient seen by me on:  04/12/2023    Assessment:  Patient is a 32 year old female who presents emergency department today with a rash on her hands and concerns that there is an animal in her facility.  She reports that she has had a rash on her hands for about the last Milian years.  She has been trying medications that have not helped her.  She reports she received a phone call but has not picked up any antibiotics yet for her infection.  She also reports that one of the residents in her facility keeps putting animals in her room and she thinks one of them  may have bit her.  She does not have any obvious bite marks.  She has never seen the animal.    Differential diagnosis:  Staph aureus infection  Dyshidrotic eczema  Fungal infection  Paranoia  Delusional parasitosis    Plan:  No orders of the defined types were placed in this encounter.    Medications  doxycycline hyclate (VIBRA-TABS) tablet 100 mg (100 mg Oral Given 04/12/23 2108)      ED Course and Disposition:  Patient had cultures obtained from April 10 from her hands which grew Staph aureus.  The patient reports that she is unsure if she received a phone call and has not been taking antibiotics for this.  She was given a dose of doxycycline here in the emergency department and I discussed with her the importance of continuing this antibiotic.  She reports that she is going to pick it up from [facility] outpatient pharmacy where the medication is sent to.  I also discussed with patient the importance of removing the gloves as it may make her infection worse.  Discussed strict return precautions.  Patient be discharged in stable conditions.    This note was dictated using Conservation officer, historic buildings. A reasonable effort was made to proofread this note but there may be minor transcription/typographical errors.                Juluis Rainier, PA            Tawny Asal [place], [place]  04/12/23 2112

## 2023-04-12 NOTE — ED Triage Notes (Addendum)
PT presents to the ED w/c/o animal bite. Pt was at [facility] and states she thought she saw an animal crawling around and thinks that it bit her, but pt unsure where, and states she wants blood work done. PT has an infection on her hands. Pt wants to talk to SW about where she's staying at and getting back on her meds.     Prehospital medications given: No

## 2023-04-13 ENCOUNTER — Other Ambulatory Visit: Payer: Self-pay

## 2023-04-13 ENCOUNTER — Emergency Department
Admission: EM | Admit: 2023-04-13 | Discharge: 2023-04-13 | Disposition: A | Discharge status: Home / Self Care | Payer: Medicaid Other | Source: Ambulatory Visit | Attending: Emergency Medicine | Admitting: Emergency Medicine

## 2023-04-13 ENCOUNTER — Encounter: Payer: Self-pay | Admitting: Emergency Medicine

## 2023-04-13 DIAGNOSIS — F17210 Nicotine dependence, cigarettes, uncomplicated: Secondary | ICD-10-CM | POA: Insufficient documentation

## 2023-04-13 DIAGNOSIS — L299 Pruritus, unspecified: Secondary | ICD-10-CM | POA: Insufficient documentation

## 2023-04-13 MED ORDER — LIDOCAINE 4 % EX PATCH *I*
1.0000 | MEDICATED_PATCH | CUTANEOUS | Status: DC
Start: 2023-04-13 — End: 2023-04-14
  Administered 2023-04-13: 1 via TRANSDERMAL
  Filled 2023-04-13: qty 1

## 2023-04-13 MED ORDER — IBUPROFEN 600 MG PO TABS *I*
600.0000 mg | ORAL_TABLET | Freq: Once | ORAL | Status: AC
Start: 2023-04-13 — End: 2023-04-13
  Administered 2023-04-13: 600 mg via ORAL
  Filled 2023-04-13: qty 1

## 2023-04-13 NOTE — Discharge Instructions (Signed)
Please stop wearing gloves.    Let your hands dry out.    Follow up with dermatology.    You can buy lidocaine patches over the counter for your neck pain. Wear for 12 hours, then leave off for 12 hours.

## 2023-04-13 NOTE — ED Provider Notes (Signed)
History     Chief Complaint   Patient presents with    Hand Pain     Per patient with rash on her hands.  She reports that has been present for some time care.  She tells me she is putting different lotions on it including permethrin, body lotion, baby lotion.  She is a very poor historian.  She is wearing gloves on the hand.  She denies any itching.  At triage she said it happened after eating eggs, she now denies this.      History provided by:  Patient and medical records        Medical/Surgical/Family History     Past Medical History:   Diagnosis Date    Anxiety     Asthma     Depression     Gestational diabetes     Preeclampsia     Scabies     Substance abuse         Patient Active Problem List   Diagnosis Code    Paranoia F22    Scabies B86    High-risk pregnancy O09.90    PTSD (post-traumatic stress disorder) F43.10    History of cesarean section complicating pregnancy O34.219    H/O fetal anomaly in prior pregnancy, currently pregnant O09.299    History of gestational diabetes in prior pregnancy, currently pregnant O09.299, Z86.32    Hx of preeclampsia, prior pregnancy, currently pregnant O09.299    Substance use disorder F19.90    Homeless Z59.00    Asthma during pregnancy O99.519, J45.909    Trichomonas infection A59.9    Staph aureus infection A49.01    Rash of hands R21    Suicidal ideation R45.851    Rubella non-immune status, antepartum O09.899, Z28.39    GBS (group B Streptococcus carrier), +RV culture, currently pregnant O99.820    History of cesarean delivery Z98.891    Complex care coordination Z71.89            Past Surgical History:   Procedure Laterality Date    CESAREAN SECTION, LOW TRANSVERSE      Left leg surgery      TONSILLECTOMY AND ADENOIDECTOMY            Social History     Tobacco Use    Smoking status: Every Day     Packs/day: .5     Types: Cigarettes     Start date: 2002    Smokeless tobacco: Former     Types: Snuff, Chew   Substance Use Topics    Alcohol use: Not Currently      Comment: last drink unknown at this time    Drug use: Yes     Types: Cocaine, Methamphetamines, IV, Heroin, Marijuana     Comment: admits to past hx of drug abuse             Review of Systems    Physical Exam     Triage Vitals  Triage Start: Start, (04/13/23 1344)  First Recorded BP: 116/51, Resp: 16, Temp: 36.7 C (98.1 F) Oxygen Therapy SpO2: 97 %, O2 Device: None (Room air), Heart Rate: 75, (04/13/23 1345)  .  First Pain Reported  0-10 Scale: 10, Pain Location/Orientation: Generalized, (04/13/23 1345)       Physical Exam  Vitals and nursing note reviewed.   Constitutional:       Appearance: Normal appearance.   Cardiovascular:      Rate and Rhythm: Normal rate.   Skin:  Findings: Lesion present.      Comments: Bilateral hands with sloughing skin, moist appearing  No erythema or open lesions   Neurological:      Mental Status: She is alert.   Psychiatric:      Comments: Labile          Medical Decision Making   Patient seen by me on:  04/13/2023    Assessment:  [name] is a 32 y.o. female  With rash     Plan:  Patient was counseled that her hands are sloughing because they are too moist, she was counseled to stop wearing gloves.  She does not appear to have scabies at this time, she was instructed not to use permethrin cream.  Outpatient follow-up with PCP and dermatology.                Alaria Oconnor, DO            Dereck Ligas, DO  04/13/23 2335

## 2023-04-13 NOTE — ED Triage Notes (Signed)
Pt presents with some itching, burning, and swelling in hands. Pt states this started after eating some egg. Pt has a egg allergy. Pt also endorses n/v.     Prehospital medications given: No

## 2023-04-18 ENCOUNTER — Other Ambulatory Visit: Payer: Self-pay

## 2023-04-18 ENCOUNTER — Emergency Department
Admission: EM | Admit: 2023-04-18 | Discharge: 2023-04-18 | Disposition: A | Discharge status: Home / Self Care | Payer: Medicaid Other | Source: Ambulatory Visit | Attending: Student in an Organized Health Care Education/Training Program | Admitting: Student in an Organized Health Care Education/Training Program

## 2023-04-18 ENCOUNTER — Emergency Department: Payer: Medicaid Other

## 2023-04-18 DIAGNOSIS — F17210 Nicotine dependence, cigarettes, uncomplicated: Secondary | ICD-10-CM | POA: Insufficient documentation

## 2023-04-18 DIAGNOSIS — Z20822 Contact with and (suspected) exposure to covid-19: Secondary | ICD-10-CM

## 2023-04-18 DIAGNOSIS — F419 Anxiety disorder, unspecified: Secondary | ICD-10-CM

## 2023-04-18 DIAGNOSIS — R062 Wheezing: Secondary | ICD-10-CM

## 2023-04-18 DIAGNOSIS — R0602 Shortness of breath: Secondary | ICD-10-CM

## 2023-04-18 DIAGNOSIS — R059 Cough, unspecified: Secondary | ICD-10-CM

## 2023-04-18 DIAGNOSIS — Z1152 Encounter for screening for COVID-19: Secondary | ICD-10-CM | POA: Insufficient documentation

## 2023-04-18 DIAGNOSIS — J45909 Unspecified asthma, uncomplicated: Secondary | ICD-10-CM | POA: Insufficient documentation

## 2023-04-18 LAB — COMPREHENSIVE METABOLIC PANEL
ALT: 6 U/L (ref 0–35)
AST: 16 U/L (ref 0–35)
Albumin: 3.9 g/dL (ref 3.5–5.2)
Alk Phos: 63 U/L (ref 35–105)
Anion Gap: 11 (ref 7–16)
Bilirubin,Total: 0.5 mg/dL (ref 0.0–1.2)
CO2: 23 mmol/L (ref 20–28)
Calcium: 8.5 mg/dL — ABNORMAL LOW (ref 8.8–10.2)
Chloride: 105 mmol/L (ref 96–108)
Creatinine: 0.68 mg/dL (ref 0.51–0.95)
Glucose: 81 mg/dL (ref 60–99)
Lab: 10 mg/dL (ref 6–20)
Potassium: 4.1 mmol/L (ref 3.3–5.1)
Sodium: 139 mmol/L (ref 133–145)
Total Protein: 6.3 g/dL (ref 6.3–7.7)
eGFR BY CREAT: 119 *

## 2023-04-18 LAB — COVID/INFLUENZA A & B/RSV NAAT (PCR)
COVID-19 NAAT (PCR): NEGATIVE
Influenza A NAAT (PCR): NEGATIVE
Influenza B NAAT (PCR): NEGATIVE
RSV NAAT (PCR): NEGATIVE

## 2023-04-18 LAB — CBC AND DIFFERENTIAL
Baso # K/uL: 0 10*3/uL (ref 0.0–0.2)
Eos # K/uL: 0.5 10*3/uL (ref 0.0–0.5)
Hematocrit: 39 % (ref 34–49)
Hemoglobin: 12.6 g/dL (ref 11.2–16.0)
IMM Granulocytes #: 0 10*3/uL (ref 0.0–0.0)
IMM Granulocytes: 0.4 %
Lymph # K/uL: 1.2 10*3/uL (ref 1.0–5.0)
MCV: 95 fL (ref 75–100)
Mono # K/uL: 0.5 10*3/uL (ref 0.1–1.0)
Neut # K/uL: 4.4 10*3/uL (ref 1.5–6.5)
Nucl RBC # K/uL: 0 10*3/uL (ref 0.0–0.0)
Nucl RBC %: 0 /100 WBC (ref 0.0–0.2)
Platelets: 221 10*3/uL (ref 150–450)
RBC: 4.1 MIL/uL (ref 4.0–5.5)
RDW: 12.9 % (ref 0.0–15.0)
Seg Neut %: 66.1 %
WBC: 6.7 10*3/uL (ref 3.5–11.0)

## 2023-04-18 LAB — PERFORMING LAB

## 2023-04-18 LAB — NT-PRO BNP: NT-pro BNP: 64 pg/mL (ref 0–450)

## 2023-04-18 MED ORDER — PREDNISONE 20 MG PO TABS *I*
40.0000 mg | ORAL_TABLET | Freq: Every day | ORAL | 0 refills | Status: AC
Start: 2023-04-18 — End: 2023-04-22
  Filled 2023-04-18: qty 8, 4d supply, fill #0

## 2023-04-18 MED ORDER — SODIUM CHLORIDE 0.9 % FLUSH FOR PUMPS *I*
0.0000 mL/h | INTRAVENOUS | Status: DC | PRN
Start: 2023-04-18 — End: 2023-04-18

## 2023-04-18 MED ORDER — DEXTROSE 5 % FLUSH FOR PUMPS *I*
0.0000 mL/h | INTRAVENOUS | Status: DC | PRN
Start: 2023-04-18 — End: 2023-04-18

## 2023-04-18 MED ORDER — PREDNISONE 20 MG PO TABS *I*
40.0000 mg | ORAL_TABLET | Freq: Once | ORAL | Status: AC
Start: 2023-04-18 — End: 2023-04-18
  Administered 2023-04-18: 40 mg via ORAL
  Filled 2023-04-18: qty 2

## 2023-04-18 MED ORDER — IPRATROPIUM-ALBUTEROL 0.5-2.5 MG/3ML IN SOLN *I*
9.0000 mL | Freq: Once | RESPIRATORY_TRACT | Status: AC
Start: 2023-04-18 — End: 2023-04-18
  Administered 2023-04-18: 9 mL via RESPIRATORY_TRACT
  Filled 2023-04-18: qty 9

## 2023-04-18 NOTE — ED Triage Notes (Signed)
PT presents to the ED w/c/o trouble breathing x 8 days.     Prehospital medications given: No

## 2023-04-18 NOTE — Discharge Instructions (Addendum)
You were seen in the Emergency Department today for evaluation of shortness of breath, cough, and wheezing for 2 months.  Your lab workup and vitals today were reassuring.  You were given DuoNebs x 3 here and prednisone.  Prescription sent to pharmacy for five day course of prednisone to help with inflammation. Please pick up the prescribed nebulizer waiting for you at [facility]. Based on our evaluation it appears that you are stable for discharge.      You need a PCP to help control you asthma long-term. Please contact the number listed to schedule an appointment to establish care and long-term management of your asthma.    It is important to obtain follow up with your primary care physician whenever you have been evaluated in the Emergency Department. Please call your doctor's office and schedule an appointment.     Return to the Emergency Department if your symptoms persist or worsen - especially if you experience chest pain, trouble breathing, high fevers, non stopped vomiting.     Please return if you have any concerns and are unable to contact your PCP.     Thank you for allowing me to care for you today. Hope that you feel better soon.

## 2023-04-18 NOTE — ED Provider Notes (Signed)
History     Chief Complaint   Patient presents with    Shortness of Breath     [name] is a 32 y.o. female with h/o substance use disorder, homelessness, asthma who presents with shortness of breath and wheezing for 2 months. Only uses albuterol inhaler at home. Patient states, "I think I have lung or heart cancer or something. I need oxygen." Denis fever or chills. No congestion or rhinorrhea. Has associated dry cough.             Medical/Surgical/Family History     Past Medical History:   Diagnosis Date    Anxiety     Asthma     Depression     Gestational diabetes     Preeclampsia     Scabies     Substance abuse         Patient Active Problem List   Diagnosis Code    Paranoia F22    Scabies B86    High-risk pregnancy O09.90    PTSD (post-traumatic stress disorder) F43.10    History of cesarean section complicating pregnancy O34.219    H/O fetal anomaly in prior pregnancy, currently pregnant O09.299    History of gestational diabetes in prior pregnancy, currently pregnant O09.299, Z86.32    Hx of preeclampsia, prior pregnancy, currently pregnant O09.299    Substance use disorder F19.90    Homeless Z59.00    Asthma during pregnancy O99.519, J45.909    Trichomonas infection A59.9    Staph aureus infection A49.01    Rash of hands R21    Suicidal ideation R45.851    Rubella non-immune status, antepartum O09.899, Z28.39    GBS (group B Streptococcus carrier), +RV culture, currently pregnant O99.820    History of cesarean delivery Z98.891    Complex care coordination Z71.89            Past Surgical History:   Procedure Laterality Date    CESAREAN SECTION, LOW TRANSVERSE      Left leg surgery      TONSILLECTOMY AND ADENOIDECTOMY            Social History     Tobacco Use    Smoking status: Every Day     Packs/day: .5     Types: Cigarettes     Start date: 2002    Smokeless tobacco: Former     Types: Snuff, Chew   Substance Use Topics    Alcohol use: Not Currently     Comment: last drink unknown at this time     Drug use: Yes     Types: Cocaine, Methamphetamines, IV, Heroin, Marijuana     Comment: admits to past hx of drug abuse             Review of Systems   Respiratory:  Positive for cough and shortness of breath.    Cardiovascular:  Negative for chest pain.   All other systems reviewed and are negative.      Physical Exam     Triage Vitals  Triage Start: Start, (04/18/23 0920)  First Recorded BP: 107/83, Resp: 18, Temp: 36.7 C (98 F) Oxygen Therapy SpO2: 99 %, O2 Device: None (Room air), Heart Rate: 94, (04/18/23 0921)  .  First Pain Reported  0-10 Scale: 0, (04/18/23 8119)       Physical Exam  Vitals and nursing note reviewed.   Constitutional:       General: She is not in acute distress.     Appearance:  She is not ill-appearing, toxic-appearing or diaphoretic.      Comments: Increased work of breathing with talking.    HENT:      Head: Normocephalic and atraumatic.   Cardiovascular:      Rate and Rhythm: Normal rate and regular rhythm.   Pulmonary:      Breath sounds: Examination of the right-upper field reveals wheezing. Examination of the left-upper field reveals wheezing. Examination of the right-middle field reveals wheezing. Examination of the left-middle field reveals wheezing. Examination of the right-lower field reveals wheezing. Examination of the left-lower field reveals wheezing. Wheezing present.   Chest:      Chest wall: No deformity or tenderness.   Musculoskeletal:         General: Normal range of motion.      Cervical back: Normal range of motion.      Right lower leg: No tenderness. No edema.      Left lower leg: No tenderness. No edema.      Comments: No calf tenderness. No erythema or asymmetry.    Skin:     Capillary Refill: Capillary refill takes less than 2 seconds.      Coloration: Skin is not cyanotic or pale.      Findings: No erythema.   Neurological:      General: No focal deficit present.      Mental Status: She is alert and oriented to person, place, and time.   Psychiatric:          Mood and Affect: Mood is anxious.         Behavior: Behavior normal.       Medical Decision Making     Assessment:  [name] is a 32 y.o. female with h/o substance use disorder, homelessness, asthma who presents with shortness of breath, cough, and wheezing for 2 months. Only has albuterol inhaler at home. Vitals are stable here. Not tachycardic. Saturating well on room air at rest and with ambulation around the ED.  On exam, patient does appear to have increased work of breathing but able to speak in full sentences.  No pallor or jaundice.  Lungs notable for expiratory wheezes throughout.  Bilateral lower extremity without pitting edema.    Differential diagnosis:  Asthma exacerbation  HF   PNA  Anemia  Electrolyte abnormalities  Pleural effusion  Pulmonary edema   PE considered but inconsistent with presentation - PERC negative     Plan:  Orders Placed This Encounter      COVID/Influenza A & B/RSV NAAT (PCR)      *Chest standard frontal and lateral views      CBC and differential      Comprehensive metabolic panel      NT-pro BNP      Check pulse oximetry while ambulating      Initiate COVID precautions      Initiate droplet isolation      Insert peripheral IV    Medications  sodium chloride 0.9 % FLUSH REQUIRED IF PATIENT HAS IV (has no administration in time range)  dextrose 5 % FLUSH REQUIRED IF PATIENT HAS IV (has no administration in time range)  predniSONE (DELTASONE) tablet 40 mg (has no administration in time range)  ipratropium-albuterol (DUONEB) 0.5-2.5mg  /55mL nebulization solution 9 mL (has no administration in time range)      ED Course and Disposition:  Reviewed lab workup, which is reassuring. No anemia. BNP wnl. CXR without evidence to suggest pneumonia. No pleural effusion. No pnx. Presentation  most likely c/w asthma exacerbation.  Patient given 40 prednisone and mini heart tx here.  On reassessment, patient was sleeping and appeared much more comfortable.  Wheezing improved.  At this time,  patient hemodynamically stable and appropriate for discharge.  Prescription sent for 4 additional days of prednisone to help decrease inflammation.  Patient has a nebulizer machine waiting for her at [facility] pharmacy and that was sent by provider last week.  I encouraged her to pick this up.  I also encouraged her to call [place] [facility] to establish care for long-term management of these symptoms.  ED return precautions discussed. Pt verbalized understanding and agreed with the plan.     This note was dicated using Conservation officer, historic buildings.  Though some attempt at proofreading has been made, some transcription errors may remain.         *Chest standard frontal and lateral views   Final Result      No acute cardiopulmonary disease.      END OF IMPRESSION                  [facility] submits this DICOM format image data and final report to the [facility], an independent secure electronic health information exchange, on a reciprocally searchable basis (with patient authorization) for a minimum of 12 months after exam    date.             ED Course as of 04/18/23 1219   Sat Apr 18, 2023   1010 On my review of chest x-ray, no lobar consolidation.  No pleural effusion.  No discernible signs suggestive of pneumothorax   1154 Sleeping comfortably on reassessment after mini heart       Jazariah Teall, PA          Author:  [employer], PA       Tishara Pizano, [place], [place]  04/18/23 1230

## 2023-04-20 ENCOUNTER — Encounter: Payer: Self-pay | Admitting: Emergency Medicine

## 2023-04-28 ENCOUNTER — Other Ambulatory Visit: Payer: Self-pay

## 2023-05-10 ENCOUNTER — Emergency Department
Admission: EM | Admit: 2023-05-10 | Discharge: 2023-05-10 | Disposition: A | Discharge status: Home / Self Care | Payer: Medicaid Other | Source: Ambulatory Visit | Attending: Emergency Medicine | Admitting: Emergency Medicine

## 2023-05-10 ENCOUNTER — Other Ambulatory Visit: Payer: Self-pay

## 2023-05-10 DIAGNOSIS — R21 Rash and other nonspecific skin eruption: Secondary | ICD-10-CM | POA: Insufficient documentation

## 2023-05-10 DIAGNOSIS — B86 Scabies: Secondary | ICD-10-CM | POA: Insufficient documentation

## 2023-05-10 MED ORDER — PERMETHRIN 5 % EX CREA *I*
TOPICAL_CREAM | Freq: Once | CUTANEOUS | 0 refills | Status: AC
Start: 2023-05-10 — End: 2023-05-13
  Filled 2023-05-10: qty 60, 1d supply, fill #0

## 2023-05-10 MED ORDER — PERMETHRIN 5 % EX CREA *I*
TOPICAL_CREAM | Freq: Once | CUTANEOUS | Status: AC
Start: 2023-05-10 — End: 2023-05-10
  Filled 2023-05-10: qty 60

## 2023-05-10 MED ORDER — HYDROXYZINE HCL 25 MG PO TABS *I*
50.0000 mg | ORAL_TABLET | Freq: Once | ORAL | Status: AC
Start: 2023-05-10 — End: 2023-05-10
  Administered 2023-05-10: 50 mg via ORAL
  Filled 2023-05-10: qty 2

## 2023-05-10 MED ORDER — PERMETHRIN 5 % EX CREA *I*
TOPICAL_CREAM | Freq: Once | CUTANEOUS | Status: DC
Start: 2023-05-10 — End: 2023-05-10
  Filled 2023-05-10: qty 60

## 2023-05-10 NOTE — ED Triage Notes (Signed)
Arrives via EMS with concern for rash to bilateral hands.       Prehospital medications given: No

## 2023-05-10 NOTE — ED Provider Notes (Signed)
History     Chief Complaint   Patient presents with    Rash     Patient is a 32 year old female presenting to the emergency department with itchy rash started between her fingers spread to her hands and was seen in multiple emergency department visits she has been prescribed permethrin, doxycycline patient states she has not been able to pick up these medications or take them.  Denies any new medications denies any fevers chills denies any on the chest back abdomen or mucous membranes.      History provided by:  Patient        Medical/Surgical/Family History     Past Medical History:   Diagnosis Date    Anxiety     Asthma     Depression     Gestational diabetes     Preeclampsia     Scabies     Substance abuse         Patient Active Problem List   Diagnosis Code    Paranoia F22    Scabies B86    High-risk pregnancy O09.90    PTSD (post-traumatic stress disorder) F43.10    History of cesarean section complicating pregnancy O34.219    H/O fetal anomaly in prior pregnancy, currently pregnant O09.299    History of gestational diabetes in prior pregnancy, currently pregnant O09.299, Z86.32    Hx of preeclampsia, prior pregnancy, currently pregnant O09.299    Substance use disorder F19.90    Homeless Z59.00    Asthma during pregnancy O99.519, J45.909    Trichomonas infection A59.9    Staph aureus infection A49.01    Rash of hands R21    Suicidal ideation R45.851    Rubella non-immune status, antepartum O09.899, Z28.39    GBS (group B Streptococcus carrier), +RV culture, currently pregnant O99.820    History of cesarean delivery Z98.891    Complex care coordination Z71.89            Past Surgical History:   Procedure Laterality Date    CESAREAN SECTION, LOW TRANSVERSE      Left leg surgery      TONSILLECTOMY AND ADENOIDECTOMY            Social History     Tobacco Use    Smoking status: Every Day     Packs/day: .5     Types: Cigarettes     Start date: 2002    Smokeless tobacco: Former     Types: Snuff, Chew   Substance  Use Topics    Alcohol use: Not Currently     Comment: last drink unknown at this time    Drug use: Yes     Types: Cocaine, Methamphetamines, IV, Heroin, Marijuana     Comment: admits to past hx of drug abuse             Review of Systems    Physical Exam     Triage Vitals  Triage Start: Start, (05/10/23 1841)  First Recorded BP: 118/71, Resp: 16, Temp: 36.6 C (97.9 F), Temp src: TEMPORAL Oxygen Therapy SpO2: 98 %, Oximetry Source: Rt Hand, O2 Device: None (Room air), Heart Rate: 82, (05/10/23 1842)  .  First Pain Reported  0-10 Scale: 9, Pain Location/Orientation: Hand Left;Hand Right, Pain Descriptors: Discomfort, (05/10/23 1855)       Physical Exam  Constitutional:       Appearance: Normal appearance.   HENT:      Head: Normocephalic and atraumatic.      Right  Ear: External ear normal.      Left Ear: External ear normal.      Mouth/Throat:      Mouth: Mucous membranes are moist.      Pharynx: Oropharynx is clear.   Eyes:      General: No scleral icterus.     Conjunctiva/sclera: Conjunctivae normal.   Cardiovascular:      Rate and Rhythm: Normal rate.   Pulmonary:      Effort: Pulmonary effort is normal. No respiratory distress.   Abdominal:      General: There is no distension.      Tenderness: There is no abdominal tenderness.   Musculoskeletal:         General: No swelling or deformity.      Cervical back: Neck supple. No rigidity.   Skin:     Coloration: Skin is not jaundiced or pale.      Comments: Rash as depicted in separate photo   Neurological:      Mental Status: She is alert.         Medical Decision Making     Assessment:  32 year old female with rash starting between fingers if she lives in multiple person oxygen saturation concerning for scabies diagnosed previously, she has not follow through with treatment as prescribed do not suspect cellulitis she has an outpatient Derm referral do not appear toxic, patient given the permethrin in the emergency department to ensure compliance she was discharged  in stable condition after receiving her medication discussed importance of follow-up and retreatment in 2 weeks patient expressed understanding she was discharged in stable condition.                Valeda Malm, MD            Valeda Malm, MD  05/10/23 2034

## 2023-05-11 ENCOUNTER — Other Ambulatory Visit: Payer: Self-pay

## 2023-05-12 ENCOUNTER — Other Ambulatory Visit: Payer: Self-pay

## 2023-05-12 ENCOUNTER — Encounter: Payer: Self-pay | Admitting: Emergency Medicine

## 2023-05-12 DIAGNOSIS — W5503XA Scratched by cat, initial encounter: Secondary | ICD-10-CM | POA: Insufficient documentation

## 2023-05-12 DIAGNOSIS — S40811A Abrasion of right upper arm, initial encounter: Secondary | ICD-10-CM | POA: Insufficient documentation

## 2023-05-12 DIAGNOSIS — S40812A Abrasion of left upper arm, initial encounter: Secondary | ICD-10-CM | POA: Insufficient documentation

## 2023-05-12 DIAGNOSIS — Y998 Other external cause status: Secondary | ICD-10-CM | POA: Insufficient documentation

## 2023-05-12 DIAGNOSIS — W5501XA Bitten by cat, initial encounter: Secondary | ICD-10-CM

## 2023-05-12 DIAGNOSIS — S41151A Open bite of right upper arm, initial encounter: Secondary | ICD-10-CM

## 2023-05-12 DIAGNOSIS — S41152A Open bite of left upper arm, initial encounter: Secondary | ICD-10-CM

## 2023-05-12 DIAGNOSIS — Y9289 Other specified places as the place of occurrence of the external cause: Secondary | ICD-10-CM | POA: Insufficient documentation

## 2023-05-12 DIAGNOSIS — Y9389 Activity, other specified: Secondary | ICD-10-CM | POA: Insufficient documentation

## 2023-05-12 NOTE — ED Triage Notes (Signed)
Patient to the ED with shortness of breath for unknown time per EMS. Patient states that she is allergic to a cat that she just got and believes that that triggered her asthma. Patient also requesting rabies shot because she got bit by the cat. Per ems patient had minor wheezing on R side.     Prehospital medications given: Yes  Respiratory/Allergic Reaction: DuoNeb (2)

## 2023-05-13 ENCOUNTER — Other Ambulatory Visit: Payer: Self-pay

## 2023-05-13 ENCOUNTER — Encounter: Payer: Self-pay | Admitting: Student in an Organized Health Care Education/Training Program

## 2023-05-13 ENCOUNTER — Encounter: Payer: Self-pay | Admitting: Emergency Medicine

## 2023-05-13 ENCOUNTER — Emergency Department: Payer: Medicaid Other

## 2023-05-13 ENCOUNTER — Emergency Department
Admission: EM | Admit: 2023-05-13 | Discharge: 2023-05-13 | Disposition: A | Discharge status: Home / Self Care | Payer: Medicaid Other | Source: Ambulatory Visit | Attending: Student in an Organized Health Care Education/Training Program | Admitting: Student in an Organized Health Care Education/Training Program

## 2023-05-13 ENCOUNTER — Emergency Department
Admission: EM | Admit: 2023-05-13 | Discharge: 2023-05-13 | Disposition: A | Discharge status: Home / Self Care | Payer: Medicaid Other | Source: Ambulatory Visit | Attending: Emergency Medicine | Admitting: Emergency Medicine

## 2023-05-13 DIAGNOSIS — Z5919 Other inadequate housing: Secondary | ICD-10-CM | POA: Insufficient documentation

## 2023-05-13 DIAGNOSIS — Z760 Encounter for issue of repeat prescription: Secondary | ICD-10-CM | POA: Insufficient documentation

## 2023-05-13 DIAGNOSIS — R0602 Shortness of breath: Secondary | ICD-10-CM

## 2023-05-13 DIAGNOSIS — Z789 Other specified health status: Secondary | ICD-10-CM

## 2023-05-13 DIAGNOSIS — T148XXA Other injury of unspecified body region, initial encounter: Secondary | ICD-10-CM

## 2023-05-13 DIAGNOSIS — J45909 Unspecified asthma, uncomplicated: Secondary | ICD-10-CM

## 2023-05-13 DIAGNOSIS — F17210 Nicotine dependence, cigarettes, uncomplicated: Secondary | ICD-10-CM

## 2023-05-13 MED ORDER — IPRATROPIUM BROMIDE 0.02 % IN SOLN *I*
500.0000 ug | RESPIRATORY_TRACT | Status: DC | PRN
Start: 2023-05-13 — End: 2023-05-13

## 2023-05-13 MED ORDER — ALBUTEROL SULFATE HFA 108 (90 BASE) MCG/ACT IN AERS *I*
1.0000 | INHALATION_SPRAY | Freq: Four times a day (QID) | RESPIRATORY_TRACT | 0 refills | Status: AC | PRN
Start: 2023-05-13 — End: 2023-06-12
  Filled 2023-05-13: qty 1000, fill #0

## 2023-05-13 MED ORDER — ALBUTEROL SULFATE (2.5 MG/3ML) 0.083% IN NEBU *I*
2.5000 mg | INHALATION_SOLUTION | RESPIRATORY_TRACT | Status: DC | PRN
Start: 2023-05-13 — End: 2023-05-13

## 2023-05-13 MED ORDER — AMOXICILLIN-POT CLAVULANATE 875-125 MG PO TABS *I*
1.0000 | ORAL_TABLET | Freq: Two times a day (BID) | ORAL | 0 refills | Status: AC
Start: 2023-05-13 — End: 2023-05-20

## 2023-05-13 MED ORDER — ALBUTEROL SULFATE HFA 108 (90 BASE) MCG/ACT IN AERS *I*
2.0000 | INHALATION_SPRAY | Freq: Once | RESPIRATORY_TRACT | Status: DC
Start: 2023-05-13 — End: 2023-05-13

## 2023-05-13 MED ORDER — AMOXICILLIN-POT CLAVULANATE 875-125 MG PO TABS *I*
1.0000 | ORAL_TABLET | Freq: Once | ORAL | Status: AC
Start: 2023-05-13 — End: 2023-05-13
  Administered 2023-05-13: 1 via ORAL
  Filled 2023-05-13: qty 1

## 2023-05-13 NOTE — ED Provider Notes (Signed)
History     Chief Complaint   Patient presents with   . Shortness of Breath     32 year old female with past history of former substance use disorder, asthma, paranoia, PTSD, poor social circumstances and formerly homeless presenting for asthma medications.  States that she is out of her inhaler.  She is feeling well right now but states that she needs an inhaler for when she becomes short of breath or starts coughing.  She has no new symptoms today.  She also requests to speak with social work because she states that she currently lives in a crack house that is infested with mice.      History provided by:  Patient and medical records        Medical/Surgical/Family History     Past Medical History:   Diagnosis Date   . Anxiety    . Asthma    . Depression    . Gestational diabetes    . Preeclampsia    . Scabies    . Substance abuse         Patient Active Problem List   Diagnosis Code   . Paranoia F22   . Scabies B86   . High-risk pregnancy O09.90   . PTSD (post-traumatic stress disorder) F43.10   . History of cesarean section complicating pregnancy O34.219   . H/O fetal anomaly in prior pregnancy, currently pregnant O09.299   . History of gestational diabetes in prior pregnancy, currently pregnant O09.299, Z61.32   . Hx of preeclampsia, prior pregnancy, currently pregnant O09.299   . Substance use disorder F19.90   . Homeless Z59.00   . Asthma during pregnancy O99.519, J45.909   . Trichomonas infection A59.9   . Staph aureus infection A49.01   . Rash of hands R21   . Suicidal ideation R45.851   . Rubella non-immune status, antepartum O09.899, Z28.39   . GBS (group B Streptococcus carrier), +RV culture, currently pregnant O99.820   . History of cesarean delivery Z98.891   . Complex care coordination Z71.89            Past Surgical History:   Procedure Laterality Date   . CESAREAN SECTION, LOW TRANSVERSE     . Left leg surgery     . TONSILLECTOMY AND ADENOIDECTOMY            Social History     Tobacco Use   .  Smoking status: Every Day     Packs/day: .5     Types: Cigarettes     Start date: 2002   . Smokeless tobacco: Former     Types: Snuff, Chew   Substance Use Topics   . Alcohol use: Not Currently     Comment: last drink unknown at this time   . Drug use: Yes     Types: Cocaine, Methamphetamines, IV, Heroin, Marijuana     Comment: admits to past hx of drug abuse             Review of Systems    Physical Exam     Triage Vitals  Triage Start: Start, (05/13/23 1306)  First Recorded BP: 116/75, Resp: 12, Temp: 36.6 C (97.9 F), Temp src: Infrared Oxygen Therapy SpO2: 100 %, Oximetry Source: Rt Hand, O2 Device: None (Room air), Heart Rate: 100, (05/13/23 1309)  .  First Pain Reported  0-10 Scale: 0, (05/13/23 1309)       Physical Exam  Vitals and nursing note reviewed.   Constitutional:  General: She is not in acute distress.     Appearance: She is well-developed. She is not ill-appearing or toxic-appearing.   HENT:      Head: Normocephalic and atraumatic.   Cardiovascular:      Rate and Rhythm: Normal rate and regular rhythm.   Pulmonary:      Effort: Pulmonary effort is normal.      Breath sounds: No decreased breath sounds, wheezing, rhonchi or rales.      Comments: Clear to auscultation, nonlabored, speaking in full sentences  Musculoskeletal:      Cervical back: Normal range of motion and neck supple.      Right lower leg: No edema.      Left lower leg: No edema.   Skin:     General: Skin is warm and dry.      Capillary Refill: Capillary refill takes less than 2 seconds.   Neurological:      General: No focal deficit present.      Mental Status: She is alert and oriented to person, place, and time.   Psychiatric:         Mood and Affect: Mood normal.         Behavior: Behavior normal.      Comments: Calm, cooperative, generally appropriate, good hygiene, decent eye contact, no evidence of internal preoccupation         Medical Decision Making   Patient seen by me on:  05/13/2023    Assessment:  This is a  32 year old female presenting for medication refill of her albuterol inhaler for asthma rescue.  Vital signs reassuring at triage.  She is well in appearance.  I have met [name] before and she appears similarly well today during my encounter.  She is slightly odd in her affect and engagement with me but appropriately looking after her needs.  She is hygienic and generally well composed.  She has no obviously critically decompensated mental illness that would require psychiatric evaluation today.  From a respiratory standpoint actually she has no wheezing, clear lungs, nonlabored respirations and is simply seeking a refill of her albuterol inhaler which we will provide today.  Will discuss case with social work for support in her poor social circumstances.  Anticipate probable discharge thereafter.           ED Course as of 05/13/23 1523   Wed May 13, 2023   1522 Spoke with social work - will eval for housing options. They are familiar with her place of residence, known issues there unfortunately.        Charlene Brooke, MD            Charlene Brooke, MD  05/13/23 [phone]

## 2023-05-13 NOTE — Progress Notes (Signed)
Health Home Referral Note  Urgent Referral? Yes     Patient Legal Name: Alexis Vang  Patient Preferred Name: Alexis Vang  [facility] Subscriber #: [id number]  Medicaid Managed Care Organization Name: [place] [employer] [facility]  DOB: 16-Feb-1991  Patient Sex Assigned at Birth: Female [1]  Patient Gender Identity:   Patient's Address: [address] [place]  [address]  [place] [address]  [place]idence[id number]   Phone: [phone] Mobile: [phone]  E-Mail: No e-mail address on record  Alternative Contact(s) Name, Phone#: N/A  Language Spoken: English  Interpreter Services needed? No   Specify Preferred or Recommended Care Management Agency, if any:       Eligibility Categories: Mental Health Condition or Asthma        Health Home Care Management Needs: Probable risk for adverse event, Repeated ER/Inpatient use, including avoidable ER use , Lack of or inadequate social/family/housing support, or Lack of or inadequate connectivity with healthcare system  Health Home Risk and Safety Concerns: Unsafe Living Environment     Provide additional information regarding Risk and Safety Concerns checked above. Patient did not report any SI/HI during the time of meeting.     Narrative: Clinical research associate met patient during ED visit. Patient is a frequent flyer at [facility] ED. Within the current year, patient has had 18 ED visits. Patient's main concern to social work was her living situation. She reported to provider that she lives in a "crack house" and there are many ongoing issues with pest and asbestos. She does not feel that it is safe for her to continue to live there. She has a rep-payee through [facility] and a mental health therapist through [facility]. She does not have a PCP. Very limited to no social supports available to patient. She would benefit from care management assistance to locate safe/affordable housing, connecting with PCP office, and working on adherence to appts.      Patient Active Problem List   Diagnosis Code    Paranoia  F22    Scabies B86    High-risk pregnancy O09.90    PTSD (post-traumatic stress disorder) F43.10    History of cesarean section complicating pregnancy O34.219    H/O fetal anomaly in prior pregnancy, currently pregnant O09.299    History of gestational diabetes in prior pregnancy, currently pregnant O09.299, Z86.32    Hx of preeclampsia, prior pregnancy, currently pregnant O09.299    Substance use disorder F19.90    Homeless Z59.00    Asthma during pregnancy O99.519, J45.909    Trichomonas infection A59.9    Staph aureus infection A49.01    Rash of hands R21    Suicidal ideation R45.851    Rubella non-immune status, antepartum O09.899, Z28.39    GBS (group B Streptococcus carrier), +RV culture, currently pregnant O99.820    History of cesarean delivery Z98.891    Complex care coordination Z71.89       Contact Information for Person Completing Referral: Glynis Smiles, MSW [facility] ED Social Worker [phone]  Name: Derry Skill  Title: Referral Coordinator  Organization: [facility]  Phone: [phone]  Email: [email or web address]      Permission to Use and Disclose Confidential Information        By signing this Consent Form, you permit people involved in your care to share your health information so that your doctors and other providers can have a complete picture of your health and help you get better care. Your health records provide information about your illnesses, injuries, medicines  and/or test results. Your records may include sensitive information, such as information about HIV status, mental health records, reproductive health records, drug and alcohol treatment, and genetic information.     If you permit disclosure, your health information will only be used to provide you with care management and related health and social services. This includes referral from one provider to another, consultation regarding care, provision of care management services, and coordination of care  among providers. Your health information may be re-disclosed only as permitted by state and federal laws and regulations. These laws limit re-disclosure of information about your treatment at a substance abuse or mental health program, HIV related information, genetic records, and records of sexually transmitted illnesses.     Your choice to give or deny consent to disclose your health information will not be the basis for denial of health services or health insurance. You can withdraw your consent at any time by signing a Withdrawal of Consent Form and giving it to one of the providers listed in Attachment A. But anyone who receives information while your consent is in effect may retain it. Even if you withdraw your consent, they are not required to return your information or remove it from their records.     You are entitled to get a copy of this Consent Form after you sign it.       Consent to disclosure of health information       The person whose information may be used or disclosed is:     Name: Alexis Vang  Date of Birth: Jul 12, 1991   .     1. The information that may be disclosed includes all records of diagnosis and health care treatment and all education records including, but not limited to: Mental health records, except that disclosure of psychotherapy notes is not permitted; Substance abuse treatment records; HIV related information; Genetic information; Information about sexually transmitted diseases; and Education records.   2. This information may be disclosed to the persons or organizations listed in Attachment A.   3. This information may be disclosed by any person or organization that holds a record described below, including those listed in Attachment A.   4. Use and disclosure of this information is permitted only as necessary for the purposes of the provision of delivery of health and social services, including outreach, service planning, referrals, care coordination, direct care, and  monitoring of the quality of service.   5. This permission expires on  ___5/15/25__(date).   6. I understand that this permission may be revoked. I also understand that records disclosed before this permission is revoked may not be retrieved. Any person or organization that relied on this permission may continue to use or disclose health information as needed to complete treatment.     I am the person whose records will be used or disclosed, or that individual's personal representative. (If personal representative, please enter relationship .)     I give permission to use and disclose my records as described in this document.     Verbal consent obtained via: Contact: In person, from: Pt/Rep: Patient.   By: Glynis Smiles, MSW    Date: 05/13/23  .     CONSENT TO [facility] - ATTACHMENT A    Health information may be disclosed for purposes of treatment to the organizations listed below. The following organizations provide and/or administer Care Management in [facility]:    [facility] (dba HCR)  [employer] [facility] [employer] Resources   [facility] [facility] Lifespan of [facility] [place]   [facility] of [place] Lifetime Care   [facility] Mental Health   [facility]. dba G[employer]er Services [facility]   [employer] Health Options [facility] Collaborative   [place] for [facility] Office of Mental Health   [facility] [employer] Community Services [place] Plan for Medical Care, Inc.   [facility] New Directions Youth & [facility] Health Department [facility] Coordination Program, Inc.   [place] [facility]- Psychiatry & Addictive Med Chapter   [facility] [place] Dept of Mental Health   [facility] [place], Inc. DBA Visiting Nurse Signature Care Pathways, Inc   [facility] [place] Person Centered Housing Options    Companion Care of [facility]ing & Assessment Services, [place].   Coordinated Care Services, Inc. [facility]/GMHC/RMHC   [facility] Drug and Alcohol [facility]   [facility] Community Services [facility]   [employer] [facility] Society for Prevention   Epilepsy-Pralid, Inc. [facility]   Family Services of [place], [employer]. [facility] Therapy Works United Cerebral Palsy of [place]   [employer] Addictions Counseling and Referral (FLACRA) [place] [facility] [place] United Cerebral Palsy dba [facility] [place], [employer].   [facility] [place] [facility] [place] Mental Health [place] ARC   [facility] Health Care Health   [facility]) YWCA   HCR Care Management, [facility]    Health Homes of [place] ([place])    [facility] Center    [facility]    [facility], [employer].    [employer]    [facility] Family Medicine

## 2023-05-13 NOTE — ED Triage Notes (Signed)
Pt to ED via EMS from home. Called for SOB, hx of asthma. Is out of her inhaler and requesting a new one. NAD at triage,       Prehospital medications given: No

## 2023-05-13 NOTE — Discharge Instructions (Addendum)
You were seen the Emergency Department today for asthma inhaler refill.  Your exam here was reassuring.  Please pick up the inhaler I sent to the pharmacy.   I placed a referral to the primary care network to help you establish with a primary care doctor.  You may return here for reevaluation with any new concerns.

## 2023-05-13 NOTE — ED Provider Notes (Signed)
History     Chief Complaint   Patient presents with    Shortness of Breath     Per with complaints related to a new cat that she just got.  She tells me that the cat has been biting and scratching her.  Additionally she is concerned that the cat has rabies and she is requesting a rabies shot today.  She tells me that she got the cap from a man that she did not know.  She is unsure if the cat has any vaccinations.  She has been feeding the cat Ensure, and tells me it has been eating and drinking normally.  She got the cat because there are mice in her apartment.  Yesterday evening the cat caught a mouse and killed it.  This is what made her think the cat had rabies.        History provided by:  Patient and medical records        Medical/Surgical/Family History     Past Medical History:   Diagnosis Date    Anxiety     Asthma     Depression     Gestational diabetes     Preeclampsia     Scabies     Substance abuse         Patient Active Problem List   Diagnosis Code    Paranoia F22    Scabies B86    High-risk pregnancy O09.90    PTSD (post-traumatic stress disorder) F43.10    History of cesarean section complicating pregnancy O34.219    H/O fetal anomaly in prior pregnancy, currently pregnant O09.299    History of gestational diabetes in prior pregnancy, currently pregnant O09.299, Z86.32    Hx of preeclampsia, prior pregnancy, currently pregnant O09.299    Substance use disorder F19.90    Homeless Z59.00    Asthma during pregnancy O99.519, J45.909    Trichomonas infection A59.9    Staph aureus infection A49.01    Rash of hands R21    Suicidal ideation R45.851    Rubella non-immune status, antepartum O09.899, Z28.39    GBS (group B Streptococcus carrier), +RV culture, currently pregnant O99.820    History of cesarean delivery Z98.891    Complex care coordination Z71.89            Past Surgical History:   Procedure Laterality Date    CESAREAN SECTION, LOW TRANSVERSE      Left leg surgery      TONSILLECTOMY AND  ADENOIDECTOMY            Social History     Tobacco Use    Smoking status: Every Day     Packs/day: .5     Types: Cigarettes     Start date: 2002    Smokeless tobacco: Former     Types: Snuff, Chew   Substance Use Topics    Alcohol use: Not Currently     Comment: last drink unknown at this time    Drug use: Yes     Types: Cocaine, Methamphetamines, IV, Heroin, Marijuana     Comment: admits to past hx of drug abuse             Review of Systems    Physical Exam     Triage Vitals  Triage Start: Start, (05/12/23 2358)  First Recorded BP: 125/74, Resp: 18, Temp: 36.6 C (97.9 F), Temp src: TEMPORAL Oxygen Therapy SpO2: 99 %, Oximetry Source: Rt Hand, O2 Device: O2 Therapy, O2 Therapy: Oxymask (  6), Heart Rate: 78, (05/13/23 0002)  .  First Pain Reported  0-10 Scale: 0, (05/13/23 0002)       Physical Exam  Vitals and nursing note reviewed.   Constitutional:       Appearance: Normal appearance.   HENT:      Head: Normocephalic.   Eyes:      Extraocular Movements: Extraocular movements intact.   Cardiovascular:      Rate and Rhythm: Normal rate.   Pulmonary:      Effort: Pulmonary effort is normal.   Musculoskeletal:         General: Normal range of motion.   Skin:     General: Skin is dry.      Comments: Bilateral upper extremities with dry, excoriated patches, few scratches  No bite marks/puncture wounds seen     Neurological:      Mental Status: She is alert.   Psychiatric:      Comments: Flight of ideas         Medical Decision Making   Patient seen by me on:  05/12/2023    Assessment:  Alexis Vang is a 32 y.o. female  With concern for animal bite, rabies exposure    Plan:  I had a long discussion with the patient regarding the need for oral antibiotics if she was bitten by the cat.  It is unclear, however I think it is best to err on the side of caution and treat her with oral antibiotics.  She requested IV antibiotics several times, I told her she did not require IV antibiotics.  She also again requested the  rabies vaccine, I explained to her that I did not think she needed this.  I have instructed her that she could follow-up with the health department this morning to discuss possible observation/testing of her animal.  She then asked if I could send [employer] to her apartment, in their space suits to check on the cat and make sure did not have rabies.  Will give po augmentin, d/c to home.     Review of existing & external labs / records: yes                Behr Cislo, DO            Dereck Ligas, DO  05/13/23 [phone]

## 2023-05-13 NOTE — Discharge Instructions (Addendum)
You need to follow up with your primary care physician, please call within 48 hours to schedule an appointment to be seen in 3-5 days.

## 2023-05-14 NOTE — Progress Notes (Signed)
Returned patient voicemail shared wait list. Placed patient on wait list.

## 2023-05-15 ENCOUNTER — Other Ambulatory Visit: Payer: Self-pay

## 2023-05-15 NOTE — Progress Notes (Signed)
Submitted Health Home referral.

## 2023-05-16 ENCOUNTER — Other Ambulatory Visit: Payer: Self-pay

## 2023-05-16 ENCOUNTER — Emergency Department
Admission: EM | Admit: 2023-05-16 | Discharge: 2023-05-16 | Discharge status: Against Medical Advice | Payer: Medicaid Other | Source: Ambulatory Visit

## 2023-05-16 MED ORDER — ALBUTEROL SULFATE 0.63 MG/3ML IN NEBU *I*
INHALATION_SOLUTION | RESPIRATORY_TRACT | 0 refills | Status: DC
Start: 2023-03-25 — End: 2023-06-19
  Filled 2023-05-16: qty 75, 6d supply, fill #0

## 2023-05-16 NOTE — ED Notes (Addendum)
Patient running back and forth from ED waiting room to main lobby frequently. Security aware. Patient states checked in to ED  to get med refill from [facility] OP pharmacy that is currently ready over there.  Patient found by security at pharmacy, received medication and escorted out of building.

## 2023-05-16 NOTE — ED Triage Notes (Signed)
Pt arrives to ED w/c/o wanting a prescription inhaler and nebulizer.      Prehospital medications given: No

## 2023-05-17 ENCOUNTER — Other Ambulatory Visit: Payer: Self-pay

## 2023-05-18 ENCOUNTER — Other Ambulatory Visit: Payer: Self-pay

## 2023-05-19 ENCOUNTER — Emergency Department
Admission: EM | Admit: 2023-05-19 | Discharge: 2023-05-19 | Disposition: A | Discharge status: Home / Self Care | Payer: Medicaid Other | Source: Ambulatory Visit | Attending: Emergency Medicine | Admitting: Emergency Medicine

## 2023-05-19 ENCOUNTER — Other Ambulatory Visit: Payer: Self-pay

## 2023-05-19 DIAGNOSIS — X58XXXA Exposure to other specified factors, initial encounter: Secondary | ICD-10-CM | POA: Insufficient documentation

## 2023-05-19 DIAGNOSIS — Y998 Other external cause status: Secondary | ICD-10-CM | POA: Insufficient documentation

## 2023-05-19 DIAGNOSIS — Y9389 Activity, other specified: Secondary | ICD-10-CM | POA: Insufficient documentation

## 2023-05-19 DIAGNOSIS — F17210 Nicotine dependence, cigarettes, uncomplicated: Secondary | ICD-10-CM | POA: Insufficient documentation

## 2023-05-19 DIAGNOSIS — Y9289 Other specified places as the place of occurrence of the external cause: Secondary | ICD-10-CM | POA: Insufficient documentation

## 2023-05-19 DIAGNOSIS — T7840XA Allergy, unspecified, initial encounter: Secondary | ICD-10-CM

## 2023-05-19 LAB — EKG 12-LEAD
P: 52 deg
PR: 140 ms
QRS: 74 deg
QRSD: 89 ms
QT: 370 ms
QTc: 417 ms
Rate: 76 {beats}/min

## 2023-05-19 MED ORDER — PREDNISONE 20 MG PO TABS *I*
20.0000 mg | ORAL_TABLET | Freq: Every day | ORAL | 0 refills | Status: AC
Start: 2023-05-19 — End: 2023-05-23
  Filled 2023-05-19: qty 4, 4d supply, fill #0

## 2023-05-19 MED ORDER — DIPHENHYDRAMINE HCL 25 MG ORAL SOLID *WRAPPED*
50.0000 mg | Freq: Once | ORAL | Status: AC
Start: 2023-05-19 — End: 2023-05-19
  Administered 2023-05-19: 50 mg via ORAL
  Filled 2023-05-19: qty 2

## 2023-05-19 MED ORDER — KETOROLAC TROMETHAMINE 30 MG/ML IJ SOLN *I*
15.0000 mg | Freq: Once | INTRAMUSCULAR | Status: AC
Start: 2023-05-19 — End: 2023-05-19
  Administered 2023-05-19: 15 mg via INTRAMUSCULAR
  Filled 2023-05-19: qty 1

## 2023-05-19 MED ORDER — PREDNISONE 20 MG PO TABS *I*
40.0000 mg | ORAL_TABLET | Freq: Once | ORAL | Status: AC
Start: 2023-05-19 — End: 2023-05-19
  Administered 2023-05-19: 40 mg via ORAL
  Filled 2023-05-19: qty 2

## 2023-05-19 NOTE — Discharge Instructions (Signed)
You need to follow up with your primary care physician, please call within 48 hours to schedule an appointment to be seen in 3-5 days.     You can take Benadryl 3 times a day as needed

## 2023-05-19 NOTE — ED Triage Notes (Signed)
Pt having allergic reaction from getting her eye lashes done in the salon. After adhesive eyes began itching along hands and eyes, pt has hives on her face, eyes shut. No angioedema noted, patient speaking in full sentences at triage.      Prehospital medications given: Yes  Respiratory/Allergic Reaction: Epinephrine

## 2023-05-20 ENCOUNTER — Other Ambulatory Visit: Payer: Self-pay

## 2023-05-20 ENCOUNTER — Encounter: Payer: Self-pay | Admitting: Emergency Medicine

## 2023-05-20 NOTE — ED Provider Notes (Signed)
History     Chief Complaint   Patient presents with    Allergic Reaction     Per patient with concern for allergic reaction from eyelash glue today after getting her eyelashes done.  No shortness of breath or difficulty breathing.  No rashes.  She has swelling of her right eye greater than left, she is having difficulty seeing out of it due to the swelling.  She is requesting "a double dose of IV anti-inflammatories, IV antibiotics and an eye patch".      History provided by:  Patient and medical records        Medical/Surgical/Family History     Past Medical History:   Diagnosis Date    Anxiety     Asthma     Depression     Gestational diabetes     Preeclampsia     Scabies     Substance abuse         Patient Active Problem List   Diagnosis Code    Paranoia F22    Scabies B86    High-risk pregnancy O09.90    PTSD (post-traumatic stress disorder) F43.10    History of cesarean section complicating pregnancy O34.219    H/O fetal anomaly in prior pregnancy, currently pregnant O09.299    History of gestational diabetes in prior pregnancy, currently pregnant O09.299, Z86.32    Hx of preeclampsia, prior pregnancy, currently pregnant O09.299    Substance use disorder F19.90    Homeless Z59.00    Asthma during pregnancy O99.519, J45.909    Trichomonas infection A59.9    Staph aureus infection A49.01    Rash of hands R21    Suicidal ideation R45.851    Rubella non-immune status, antepartum O09.899, Z28.39    GBS (group B Streptococcus carrier), +RV culture, currently pregnant O99.820    History of cesarean delivery Z98.891    Complex care coordination Z71.89            Past Surgical History:   Procedure Laterality Date    CESAREAN SECTION, LOW TRANSVERSE      Left leg surgery      TONSILLECTOMY AND ADENOIDECTOMY            Social History     Tobacco Use    Smoking status: Every Day     Packs/day: .5     Types: Cigarettes     Start date: 2002    Smokeless tobacco: Former     Types: Snuff, Chew   Substance Use Topics     Alcohol use: Not Currently     Comment: last drink unknown at this time    Drug use: Yes     Types: Cocaine, Methamphetamines, IV, Heroin, Marijuana     Comment: admits to past hx of drug abuse             Review of Systems    Physical Exam     Triage Vitals  Triage Start: Start, (05/19/23 1712)  First Recorded BP: 125/81, Resp: 18, Temp: 36.5 C (97.7 F), Temp src: TEMPORAL Oxygen Therapy SpO2: 97 %, Oximetry Source: Rt Hand, O2 Device: None (Room air), Heart Rate: 81, (05/19/23 1714)  .  First Pain Reported  0-10 Scale: 0, (05/19/23 1714)       Physical Exam  Vitals and nursing note reviewed.   HENT:      Head: Normocephalic and atraumatic.   Eyes:      General: Vision grossly intact.      Extraocular  Movements: Extraocular movements intact.      Conjunctiva/sclera: Conjunctivae normal.      Right eye: Right conjunctiva is not injected. No chemosis.     Left eye: Left conjunctiva is not injected. No chemosis.     Comments: Right upper eyelid edematous    Cardiovascular:      Rate and Rhythm: Normal rate and regular rhythm.   Musculoskeletal:      Cervical back: Normal range of motion.   Neurological:      Mental Status: She is alert. Mental status is at baseline.         Medical Decision Making   Patient seen by me on:  05/19/2023    Assessment:  Alexis Vang is a 32 y.o. female  With eye swelling     Differential diagnosis:  Allergic reaction  Cellulitis less likely     Plan:  PO benadryl  IM toradol  PO steroids     Dispo pending clinical course in the ED.       Review of existing & external labs / records: yes    ED Course and Disposition:  Patient improved after medications.  Will discharge home on 4 additional days of steroids.  Outpatient follow-up with [facility].              Sherron Mapp, DO            Dereck Ligas, DO  05/20/23 [phone]

## 2023-05-22 ENCOUNTER — Other Ambulatory Visit: Payer: Self-pay

## 2023-05-22 NOTE — Progress Notes (Signed)
Assigned Health Home Care Manager.

## 2023-05-26 ENCOUNTER — Other Ambulatory Visit: Payer: Self-pay

## 2023-05-26 ENCOUNTER — Emergency Department
Admission: EM | Admit: 2023-05-26 | Discharge: 2023-05-27 | Disposition: A | Discharge status: Home / Self Care | Payer: Medicaid Other | Source: Ambulatory Visit | Attending: Emergency Medicine | Admitting: Emergency Medicine

## 2023-05-26 DIAGNOSIS — T7421XA Adult sexual abuse, confirmed, initial encounter: Secondary | ICD-10-CM

## 2023-05-26 DIAGNOSIS — F17210 Nicotine dependence, cigarettes, uncomplicated: Secondary | ICD-10-CM | POA: Insufficient documentation

## 2023-05-26 DIAGNOSIS — Z113 Encounter for screening for infections with a predominantly sexual mode of transmission: Secondary | ICD-10-CM | POA: Insufficient documentation

## 2023-05-26 DIAGNOSIS — Z114 Encounter for screening for human immunodeficiency virus [HIV]: Secondary | ICD-10-CM | POA: Insufficient documentation

## 2023-05-26 DIAGNOSIS — Z0441 Encounter for examination and observation following alleged adult rape: Secondary | ICD-10-CM | POA: Insufficient documentation

## 2023-05-26 DIAGNOSIS — Z118 Encounter for screening for other infectious and parasitic diseases: Secondary | ICD-10-CM | POA: Insufficient documentation
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
## 2023-05-27 ENCOUNTER — Encounter: Payer: Self-pay | Admitting: Emergency Medicine

## 2023-05-27 LAB — COMPREHENSIVE METABOLIC PANEL
ALT: 11 U/L (ref 0–35)
AST: 19 U/L (ref 0–35)
Albumin: 4 g/dL (ref 3.5–5.2)
Alk Phos: 70 U/L (ref 35–105)
Anion Gap: 14 (ref 7–16)
Bilirubin,Total: 0.6 mg/dL (ref 0.0–1.2)
CO2: 23 mmol/L (ref 20–28)
Calcium: 8.8 mg/dL (ref 8.8–10.2)
Chloride: 104 mmol/L (ref 96–108)
Creatinine: 0.85 mg/dL (ref 0.51–0.95)
Glucose: 94 mg/dL (ref 60–99)
Lab: 17 mg/dL (ref 6–20)
Potassium: 4.6 mmol/L (ref 3.3–5.1)
Sodium: 141 mmol/L (ref 133–145)
Total Protein: 6.8 g/dL (ref 6.3–7.7)
eGFR BY CREAT: 93 *

## 2023-05-27 LAB — CBC AND DIFFERENTIAL
Baso # K/uL: 0 10*3/uL (ref 0.0–0.2)
Eos # K/uL: 0.4 10*3/uL (ref 0.0–0.5)
Hematocrit: 38 % (ref 34–49)
Hemoglobin: 12.5 g/dL (ref 11.2–16.0)
IMM Granulocytes #: 0 10*3/uL (ref 0.0–0.0)
IMM Granulocytes: 0.2 %
Lymph # K/uL: 2.8 10*3/uL (ref 1.0–5.0)
MCV: 95 fL (ref 75–100)
Mono # K/uL: 0.6 10*3/uL (ref 0.1–1.0)
Neut # K/uL: 2.4 10*3/uL (ref 1.5–6.5)
Nucl RBC # K/uL: 0 10*3/uL (ref 0.0–0.0)
Nucl RBC %: 0 /100 WBC (ref 0.0–0.2)
Platelets: 286 10*3/uL (ref 150–450)
RBC: 4.1 MIL/uL (ref 4.0–5.5)
RDW: 12.7 % (ref 0.0–15.0)
Seg Neut %: 37.6 %
WBC: 6.3 10*3/uL (ref 3.5–11.0)

## 2023-05-27 LAB — HEPATITIS B SURFACE ANTIGEN: HBV S Ag: NEGATIVE

## 2023-05-27 LAB — N. GONORRHOEAE NAAT (PCR): N. gonorrhoeae NAAT (PCR): NEGATIVE

## 2023-05-27 LAB — SYPHILIS SCREEN
Syphilis Screen: NEGATIVE
Syphilis Status: NONREACTIVE

## 2023-05-27 LAB — HEPATITIS C VIRUS ANTIBODY WITH REFLEX TO HEPATITIS C QUANTITATIVE: Hep C Ab: NEGATIVE

## 2023-05-27 LAB — TRICHOMONAS NAAT (PCR): Trichomonas NAAT (PCR): NEGATIVE

## 2023-05-27 LAB — HIV-1/2  ANTIGEN/ANTIBODY SCREEN WITH CONFIRMATION: HIV 1&2 ANTIGEN/ANTIBODY: NONREACTIVE

## 2023-05-27 LAB — HEPATITIS B SURFACE ANTIBODY
HBV S Ab Quant: 1.13 m[IU]/mL
HBV S Ab: NEGATIVE

## 2023-05-27 LAB — PREGNANCY TEST, SERUM: Preg,Serum: NEGATIVE

## 2023-05-27 LAB — CHLAMYDIA NAAT (PCR): Chlamydia NAAT (PCR): NEGATIVE

## 2023-05-27 MED ORDER — ONDANSETRON 4 MG PO TBDP *I*
4.0000 mg | ORAL_TABLET | Freq: Once | ORAL | Status: AC
Start: 2023-05-27 — End: 2023-05-27
  Administered 2023-05-27: 4 mg via ORAL
  Filled 2023-05-27: qty 1

## 2023-05-27 MED ORDER — METRONIDAZOLE 250 MG PO TABS *I*
2000.0000 mg | ORAL_TABLET | Freq: Once | ORAL | Status: AC
Start: 2023-05-27 — End: 2023-05-27
  Administered 2023-05-27: 2000 mg via ORAL
  Filled 2023-05-27: qty 8

## 2023-05-27 MED ORDER — AZITHROMYCIN 250 MG PO TABS *I*
1000.0000 mg | ORAL_TABLET | Freq: Once | ORAL | Status: AC
Start: 2023-05-27 — End: 2023-05-27
  Administered 2023-05-27: 1000 mg via ORAL
  Filled 2023-05-27: qty 4

## 2023-05-27 MED ORDER — (HIV PEP KIT) DOLUTEGRAVIR SODIUM 50 MG PO TABS *I*
50.0000 mg | ORAL_TABLET | Freq: Every day | ORAL | Status: DC
Start: 2023-05-27 — End: 2023-05-27
  Administered 2023-05-27: 50 mg via ORAL
  Filled 2023-05-27: qty 1

## 2023-05-27 MED ORDER — CEFTRIAXONE SODIUM 500 MG IJ SOLR *I*
500.0000 mg | Freq: Once | INTRAMUSCULAR | Status: AC
Start: 2023-05-27 — End: 2023-05-27
  Administered 2023-05-27: 500 mg via INTRAMUSCULAR
  Filled 2023-05-27: qty 5

## 2023-05-27 MED ORDER — (HIV PEP KIT) EMTRICITABINE-TENOFOVIR 200-300 MG PO TABS *I*
1.0000 | ORAL_TABLET | Freq: Every day | ORAL | Status: DC
Start: 2023-05-27 — End: 2023-05-27
  Administered 2023-05-27: 1 via ORAL
  Filled 2023-05-27: qty 1

## 2023-05-27 NOTE — ED Notes (Signed)
Patient notified writer that she took the full bottle of [employer]), provider and pharmacy notified. Vitals at this time temp 97.37F, HR 62, BP 104/60, 98% RA,  RR 18. Patient alert and oriented.

## 2023-05-27 NOTE — Discharge Instructions (Signed)
You need to follow up with your primary care physician, please call within 48 hours to schedule an appointment to be seen in 3-5 days.
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
## 2023-05-28 LAB — HEPATITIS C RNA, QUANTITATIVE, PCR
HCV PCR log: NOT DETECTED IU/mL
HCV PCR: NOT DETECTED IU/mL

## 2023-05-28 NOTE — Progress Notes (Signed)
CM reached out to pt to see if pt is interested in the Health Home program and to schedule a day and time to enroll pt. There was no answer. CM left a message.      Wynelle Beckmann, Care Manager [phone]

## 2023-05-30 ENCOUNTER — Other Ambulatory Visit: Payer: Self-pay

## 2023-06-02 ENCOUNTER — Telehealth: Payer: Self-pay

## 2023-06-02 NOTE — Telephone Encounter (Signed)
Writer returned phone call to [name] at [phone] and Clinical research associate received voicemail stating "the person you are trying to reach does not have a voicemail box set up" with no option to leave voicemail. Writer placed call utilizing *9, so writers contact number can be seen from missed calls.

## 2023-06-02 NOTE — Telephone Encounter (Signed)
Copied from CRM [id number]. Topic: Access to Care - Refer to Specialty or Service  >> Jun 02, 2023 12:27 PM Mick Sell T wrote:  Pt care manger called stated pt is interested in started services for substance abuse. Best call back number is [phone].

## 2023-06-03 ENCOUNTER — Other Ambulatory Visit: Payer: Self-pay

## 2023-06-03 NOTE — Progress Notes (Signed)
Pt called CM and stated she needed help with finding a new apartment. CM scheduled a day to get pt enrolled into the program. June 09, 2023. CM will call to confirm.        Alexis Vang, Care Manager [phone]

## 2023-06-09 ENCOUNTER — Other Ambulatory Visit: Payer: Self-pay

## 2023-06-10 NOTE — Progress Notes (Signed)
CM called pt to confirm scheduled enrollment for today. There was no answer.      June 10, 2023 enrollment was rescheduled for June 16, 2023. CM will call to confirm.        Wynelle Beckmann, Care Manager [phone]

## 2023-06-16 ENCOUNTER — Other Ambulatory Visit: Payer: Self-pay

## 2023-06-19 ENCOUNTER — Encounter: Payer: Self-pay | Admitting: Emergency Medicine

## 2023-06-19 ENCOUNTER — Emergency Department
Admission: EM | Admit: 2023-06-19 | Discharge: 2023-06-19 | Disposition: A | Discharge status: Home / Self Care | Payer: Medicaid Other | Source: Ambulatory Visit | Attending: Emergency Medicine | Admitting: Emergency Medicine

## 2023-06-19 ENCOUNTER — Other Ambulatory Visit: Payer: Self-pay

## 2023-06-19 DIAGNOSIS — R0789 Other chest pain: Secondary | ICD-10-CM

## 2023-06-19 DIAGNOSIS — F17210 Nicotine dependence, cigarettes, uncomplicated: Secondary | ICD-10-CM

## 2023-06-19 DIAGNOSIS — R0602 Shortness of breath: Secondary | ICD-10-CM

## 2023-06-19 DIAGNOSIS — J45909 Unspecified asthma, uncomplicated: Secondary | ICD-10-CM

## 2023-06-19 DIAGNOSIS — J45901 Unspecified asthma with (acute) exacerbation: Secondary | ICD-10-CM | POA: Insufficient documentation

## 2023-06-19 MED ORDER — ALBUTEROL SULFATE 0.63 MG/3ML IN NEBU *I*
INHALATION_SOLUTION | RESPIRATORY_TRACT | 0 refills | Status: DC
Start: 2023-06-19 — End: 2023-06-19

## 2023-06-19 MED ORDER — NEBULIZER/TUBING/MOUTHPIECE KIT *A*
PACK | 0 refills | Status: DC
Start: 2023-06-19 — End: 2024-07-08
  Filled 2023-06-19: qty 1, fill #0

## 2023-06-19 MED ORDER — PREDNISONE 20 MG PO TABS *I*
40.0000 mg | ORAL_TABLET | Freq: Every day | ORAL | 0 refills | Status: DC
Start: 2023-06-19 — End: 2023-06-19

## 2023-06-19 MED ORDER — PREDNISONE 20 MG PO TABS *I*
40.0000 mg | ORAL_TABLET | Freq: Every day | ORAL | 0 refills | Status: AC
Start: 2023-06-19 — End: 2023-06-23
  Filled 2023-06-19: qty 8, 4d supply, fill #0

## 2023-06-19 MED ORDER — ALBUTEROL SULFATE 0.63 MG/3ML IN NEBU *I*
INHALATION_SOLUTION | RESPIRATORY_TRACT | 0 refills | Status: DC
  Filled 2023-06-19: qty 75, 6d supply, fill #0

## 2023-06-19 MED ORDER — ALBUTEROL SULFATE HFA 108 (90 BASE) MCG/ACT IN AERS *I*
1.0000 | INHALATION_SPRAY | Freq: Four times a day (QID) | RESPIRATORY_TRACT | 0 refills | Status: DC | PRN
Start: 2023-06-19 — End: 2023-06-19

## 2023-06-19 MED ORDER — PREDNISONE 20 MG PO TABS *I*
40.0000 mg | ORAL_TABLET | Freq: Once | ORAL | Status: AC
Start: 2023-06-19 — End: 2023-06-19
  Administered 2023-06-19: 40 mg via ORAL
  Filled 2023-06-19: qty 2

## 2023-06-19 MED ORDER — IPRATROPIUM-ALBUTEROL 0.5-2.5 MG/3ML IN SOLN *I*
9.0000 mL | Freq: Once | RESPIRATORY_TRACT | Status: AC
Start: 2023-06-19 — End: 2023-06-19
  Administered 2023-06-19: 9 mL via RESPIRATORY_TRACT
  Filled 2023-06-19: qty 9

## 2023-06-19 MED ORDER — NEBULIZER/TUBING/MOUTHPIECE KIT *A*
PACK | 0 refills | Status: DC
Start: 2023-06-19 — End: 2023-06-19

## 2023-06-19 NOTE — Discharge Instructions (Addendum)
Today you were seen for an asthma exacerbation  In the ER you received Nebs and Steroids to improve your symptoms.  Please avoid any known asthma triggers such as; environmental allergens, exercise induced triggers, and/or treatment non-adherence.   Please continue to use home medications as directed  Please take Prednisone 40mg daily as directed and follow up with your primary care provider in 2 days.

## 2023-06-19 NOTE — ED Provider Notes (Signed)
History     Chief Complaint   Patient presents with    Shortness of Breath     Alexis Vang is a 32 y.o. female who presents to ED due to concern for asthma exacerbation.  Reports shortness of breath and chest tightness similar to previous asthma exacerbation.  Reports she is out of home nebs.  Denies history of blood clot.  No fever, chills , leg swelling, chest pain or any other complaints at this time.        History provided by:  Patient  Language interpreter used: No        Medical/Surgical/Family History     Past Medical History:   Diagnosis Date    Anxiety     Asthma     Depression     Gestational diabetes     Preeclampsia     Scabies     Substance abuse         Patient Active Problem List   Diagnosis Code    Paranoia F22    Scabies B86    High-risk pregnancy O09.90    PTSD (post-traumatic stress disorder) F43.10    History of cesarean section complicating pregnancy O34.219    H/O fetal anomaly in prior pregnancy, currently pregnant O09.299    History of gestational diabetes in prior pregnancy, currently pregnant O09.299, Z86.32    Hx of preeclampsia, prior pregnancy, currently pregnant O09.299    Substance use disorder F19.90    Homeless Z59.00    Asthma during pregnancy O99.519, J45.909    Trichomonas infection A59.9    Staph aureus infection A49.01    Rash of hands R21    Suicidal ideation R45.851    Rubella non-immune status, antepartum O09.899, Z28.39    GBS (group B Streptococcus carrier), +RV culture, currently pregnant O99.820    History of cesarean delivery Z98.891    Complex care coordination Z71.89            Past Surgical History:   Procedure Laterality Date    CESAREAN SECTION, LOW TRANSVERSE      Left leg surgery      TONSILLECTOMY AND ADENOIDECTOMY            Social History     Tobacco Use    Smoking status: Every Day     Packs/day: .5     Types: Cigarettes     Start date: 2002    Smokeless tobacco: Former     Types: Snuff, Chew   Substance Use Topics    Alcohol use: Yes    Drug use: Yes      Types: Cocaine, Methamphetamines, IV, Heroin, Marijuana             Review of Systems   Respiratory:  Positive for chest tightness and shortness of breath.    Cardiovascular:  Negative for chest pain, palpitations and leg swelling.       Physical Exam     Triage Vitals  Triage Start: Start, (06/19/23 0113)  First Recorded BP: 116/66, Resp: 18, Temp: 37 C (98.6 F) Oxygen Therapy SpO2: 97 %, O2 Device: None (Room air), Heart Rate: 80, (06/19/23 0115)  .      Physical Exam  Vitals and nursing note reviewed.   Constitutional:       General: She is not in acute distress.     Appearance: She is well-developed.   HENT:      Head: Normocephalic and atraumatic.   Cardiovascular:      Rate  and Rhythm: Normal rate and regular rhythm.   Pulmonary:      Effort: Pulmonary effort is normal.      Breath sounds: Normal breath sounds. No decreased breath sounds, rhonchi or rales. Wheezes: mild.  Abdominal:      General: Bowel sounds are normal.      Palpations: Abdomen is soft.   Musculoskeletal:         General: Normal range of motion.      Cervical back: Normal range of motion and neck supple.   Skin:     General: Skin is warm and dry.      Capillary Refill: Capillary refill takes less than 2 seconds.   Neurological:      Mental Status: She is alert and oriented to person, place, and time.         Medical Decision Making   Patient seen by me on:  06/19/2023    Assessment:  Alexis [place] is a 32 y.o. female who presents to ED due to concern for asthma exacerbation.    Differential diagnosis:    Asthma exacerbation  Viral syndrome  Doubt acute bronchitis/pneumonia/PE    Plan:    Orders Placed This Encounter      ED/UC REFERRAL TO PRIMARY CARE    Medications  ipratropium-albuterol (DUONEB) 0.5-2.5mg  /91mL nebulization solution 9 mL (9 mLs Nebulization Given 06/19/23 0127)  predniSONE (DELTASONE) tablet 40 mg (40 mg Oral Given 06/19/23 0217)      Other MDM elements: No hypoxia or tachycardia    ED Course and Disposition:  Symptoms  improved with treatment, next refilled, new PCP referral sent and agreeable with discharge.  Patient educated on reasons to return to the ED.  Patient understands and agrees with plan.                Alexis Vang [place], PA            [employer], [place], [place]  06/19/23 [phone]

## 2023-06-19 NOTE — Bed Hold Note (Signed)
Bed: T-01  Expected date:   Expected time:   Means of arrival:   Comments:

## 2023-06-19 NOTE — ED Triage Notes (Signed)
Patient to the ED with shortness of breath x 20 minutes. Patient states that she is having an asthma attack- states she " was breathing when it started". Per EMS lungs clear.      Prehospital medications given: No

## 2023-06-20 NOTE — Progress Notes (Signed)
CM called pt to confirm scheduled enrollment for today, pt stated yes today was still a good day.      CM went to [facility], pt stated she missed her bussed and asked to reschedule enrollment for Wednesday June 26        Wynelle Beckmann, Care Manager [phone]

## 2023-06-22 ENCOUNTER — Other Ambulatory Visit: Payer: Self-pay

## 2023-06-22 ENCOUNTER — Emergency Department
Admission: EM | Admit: 2023-06-22 | Discharge: 2023-06-23 | Payer: Medicaid Other | Source: Ambulatory Visit | Attending: Emergency Medicine | Admitting: Emergency Medicine

## 2023-06-22 DIAGNOSIS — F17210 Nicotine dependence, cigarettes, uncomplicated: Secondary | ICD-10-CM | POA: Insufficient documentation

## 2023-06-22 DIAGNOSIS — Z23 Encounter for immunization: Secondary | ICD-10-CM | POA: Insufficient documentation

## 2023-06-22 DIAGNOSIS — Z114 Encounter for screening for human immunodeficiency virus [HIV]: Secondary | ICD-10-CM | POA: Insufficient documentation

## 2023-06-22 DIAGNOSIS — T7621XA Adult sexual abuse, suspected, initial encounter: Secondary | ICD-10-CM | POA: Insufficient documentation

## 2023-06-22 DIAGNOSIS — Y9389 Activity, other specified: Secondary | ICD-10-CM | POA: Insufficient documentation

## 2023-06-22 DIAGNOSIS — Y9289 Other specified places as the place of occurrence of the external cause: Secondary | ICD-10-CM | POA: Insufficient documentation

## 2023-06-22 DIAGNOSIS — X58XXXA Exposure to other specified factors, initial encounter: Secondary | ICD-10-CM | POA: Insufficient documentation

## 2023-06-22 DIAGNOSIS — Z1152 Encounter for screening for COVID-19: Secondary | ICD-10-CM | POA: Insufficient documentation

## 2023-06-22 DIAGNOSIS — Y998 Other external cause status: Secondary | ICD-10-CM | POA: Insufficient documentation

## 2023-06-22 DIAGNOSIS — T7421XA Adult sexual abuse, confirmed, initial encounter: Secondary | ICD-10-CM
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
## 2023-06-23 ENCOUNTER — Encounter: Payer: Self-pay | Admitting: Emergency Medicine

## 2023-06-23 ENCOUNTER — Ambulatory Visit: Payer: Self-pay | Admitting: Dermatology

## 2023-06-23 LAB — RUQ PANEL (ED ONLY)
ALT: 10 U/L (ref 0–35)
AST: 14 U/L (ref 0–35)
Albumin: 4.2 g/dL (ref 3.5–5.2)
Alk Phos: 64 U/L (ref 35–105)
Amylase: 96 U/L (ref 28–100)
Bilirubin,Direct: <0.2 mg/dL (ref 0.0–0.3)
Bilirubin,Total: 0.5 mg/dL (ref 0.0–1.2)
Lipase: 35 U/L (ref 13–60)
Total Protein: 7 g/dL (ref 6.3–7.7)

## 2023-06-23 LAB — HEPATITIS A,B,C,PANEL
HBV Core Ab: NEGATIVE
HBV S Ab Quant: 1.01 m[IU]/mL
HBV S Ab: NEGATIVE
HBV S Ag: NEGATIVE
Hep C Ab: NEGATIVE
Hepatitis A IGG: POSITIVE

## 2023-06-23 LAB — BASIC METABOLIC PANEL
Anion Gap: 11 (ref 7–16)
CO2: 25 mmol/L (ref 20–28)
Calcium: 8.9 mg/dL (ref 8.8–10.2)
Chloride: 107 mmol/L (ref 96–108)
Creatinine: 0.76 mg/dL (ref 0.51–0.95)
Glucose: 84 mg/dL (ref 60–99)
Lab: 13 mg/dL (ref 6–20)
Potassium: 4.1 mmol/L (ref 3.3–5.1)
Sodium: 143 mmol/L (ref 133–145)
eGFR BY CREAT: 107 *

## 2023-06-23 LAB — CBC AND DIFFERENTIAL
Baso # K/uL: 0 10*3/uL (ref 0.0–0.2)
Eos # K/uL: 0.6 10*3/uL — ABNORMAL HIGH (ref 0.0–0.5)
Hematocrit: 40 % (ref 34–49)
Hemoglobin: 12.9 g/dL (ref 11.2–16.0)
IMM Granulocytes #: 0 10*3/uL (ref 0.0–0.0)
IMM Granulocytes: 0.2 %
Lymph # K/uL: 2.2 10*3/uL (ref 1.0–5.0)
MCV: 95 fL (ref 75–100)
Mono # K/uL: 0.5 10*3/uL (ref 0.1–1.0)
Neut # K/uL: 2.3 10*3/uL (ref 1.5–6.5)
Nucl RBC # K/uL: 0 10*3/uL (ref 0.0–0.0)
Nucl RBC %: 0 /100 WBC (ref 0.0–0.2)
Platelets: 245 10*3/uL (ref 150–450)
RBC: 4.2 MIL/uL (ref 4.0–5.5)
RDW: 12.8 % (ref 0.0–15.0)
Seg Neut %: 40.6 %
WBC: 5.7 10*3/uL (ref 3.5–11.0)

## 2023-06-23 LAB — PERFORMING LAB

## 2023-06-23 LAB — COVID/INFLUENZA A & B/RSV NAAT (PCR)
COVID-19 NAAT (PCR): NEGATIVE
Influenza A NAAT (PCR): NEGATIVE
Influenza B NAAT (PCR): NEGATIVE
RSV NAAT (PCR): NEGATIVE

## 2023-06-23 LAB — HIV-1/2  ANTIGEN/ANTIBODY SCREEN WITH CONFIRMATION: HIV 1&2 ANTIGEN/ANTIBODY: NONREACTIVE

## 2023-06-23 MED ORDER — AZITHROMYCIN 250 MG PO TABS *I*
1000.0000 mg | ORAL_TABLET | Freq: Once | ORAL | Status: AC
Start: 2023-06-23 — End: 2023-06-23
  Administered 2023-06-23: 1000 mg via ORAL
  Filled 2023-06-23: qty 4

## 2023-06-23 MED ORDER — IBUPROFEN 600 MG PO TABS *I*
600.0000 mg | ORAL_TABLET | Freq: Once | ORAL | Status: AC
Start: 2023-06-23 — End: 2023-06-23
  Administered 2023-06-23: 600 mg via ORAL
  Filled 2023-06-23: qty 1

## 2023-06-23 MED ORDER — CEFTRIAXONE SODIUM 500 MG IJ SOLR *I*
500.0000 mg | Freq: Once | INTRAMUSCULAR | Status: AC
Start: 2023-06-23 — End: 2023-06-23
  Administered 2023-06-23: 500 mg via INTRAMUSCULAR
  Filled 2023-06-23: qty 5

## 2023-06-23 MED ORDER — SODIUM CHLORIDE 0.9 % FLUSH FOR PUMPS *I*
0.0000 mL/h | INTRAVENOUS | Status: DC | PRN
Start: 2023-06-23 — End: 2023-06-23

## 2023-06-23 MED ORDER — (HIV PEP KIT) EMTRICITABINE-TENOFOVIR 200-300 MG PO TABS *I*
1.0000 | ORAL_TABLET | Freq: Every day | ORAL | Status: DC
Start: 2023-06-23 — End: 2023-06-23
  Administered 2023-06-23: 1 via ORAL
  Filled 2023-06-23: qty 1

## 2023-06-23 MED ORDER — ACETAMINOPHEN 325 MG PO TABS *I*
650.0000 mg | ORAL_TABLET | Freq: Once | ORAL | Status: AC
Start: 2023-06-23 — End: 2023-06-23
  Administered 2023-06-23: 650 mg via ORAL
  Filled 2023-06-23: qty 2

## 2023-06-23 MED ORDER — TETANUS-DIPHTH-ACELL PERT 5-2.5-18.5 LF-MCG/0.5 IM SUSP *WRAPPED*
0.5000 mL | Freq: Once | INTRAMUSCULAR | Status: AC
Start: 2023-06-23 — End: 2023-06-23
  Administered 2023-06-23: 0.5 mL via INTRAMUSCULAR
  Filled 2023-06-23: qty 0.5

## 2023-06-23 MED ORDER — METRONIDAZOLE 250 MG PO TABS *I*
2000.0000 mg | ORAL_TABLET | Freq: Once | ORAL | Status: AC
Start: 2023-06-23 — End: 2023-06-23
  Administered 2023-06-23: 2000 mg via ORAL
  Filled 2023-06-23: qty 8

## 2023-06-23 MED ORDER — DEXTROSE 5 % FLUSH FOR PUMPS *I*
0.0000 mL/h | INTRAVENOUS | Status: DC | PRN
Start: 2023-06-23 — End: 2023-06-23

## 2023-06-23 MED ORDER — ONDANSETRON 4 MG PO TBDP *I*
4.0000 mg | ORAL_TABLET | Freq: Once | ORAL | Status: AC
Start: 2023-06-23 — End: 2023-06-23
  Administered 2023-06-23: 4 mg via ORAL
  Filled 2023-06-23: qty 1

## 2023-06-23 MED ORDER — (HIV PEP KIT) DOLUTEGRAVIR SODIUM 50 MG PO TABS *I*
50.0000 mg | ORAL_TABLET | Freq: Every day | ORAL | Status: DC
Start: 2023-06-23 — End: 2023-06-23
  Administered 2023-06-23: 50 mg via ORAL
  Filled 2023-06-23 (×2): qty 1
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
## 2023-06-24 ENCOUNTER — Other Ambulatory Visit: Payer: Self-pay

## 2023-06-24 ENCOUNTER — Emergency Department
Admission: EM | Admit: 2023-06-24 | Discharge: 2023-06-24 | Discharge status: Against Medical Advice | Payer: Medicaid Other | Source: Ambulatory Visit | Attending: Emergency Medicine | Admitting: Emergency Medicine

## 2023-06-24 ENCOUNTER — Emergency Department
Admission: EM | Admit: 2023-06-24 | Discharge: 2023-06-24 | Disposition: A | Discharge status: Home / Self Care | Payer: Medicaid Other | Source: Ambulatory Visit | Attending: Emergency Medicine | Admitting: Emergency Medicine

## 2023-06-24 DIAGNOSIS — T161XXA Foreign body in right ear, initial encounter: Secondary | ICD-10-CM | POA: Insufficient documentation

## 2023-06-24 DIAGNOSIS — Y9289 Other specified places as the place of occurrence of the external cause: Secondary | ICD-10-CM | POA: Insufficient documentation

## 2023-06-24 DIAGNOSIS — H9201 Otalgia, right ear: Secondary | ICD-10-CM

## 2023-06-24 DIAGNOSIS — Y9389 Activity, other specified: Secondary | ICD-10-CM | POA: Insufficient documentation

## 2023-06-24 DIAGNOSIS — Z5321 Procedure and treatment not carried out due to patient leaving prior to being seen by health care provider: Secondary | ICD-10-CM | POA: Insufficient documentation

## 2023-06-24 DIAGNOSIS — X58XXXA Exposure to other specified factors, initial encounter: Secondary | ICD-10-CM | POA: Insufficient documentation

## 2023-06-24 DIAGNOSIS — Y998 Other external cause status: Secondary | ICD-10-CM | POA: Insufficient documentation

## 2023-06-24 DIAGNOSIS — W4904XA Ring or other jewelry causing external constriction, initial encounter: Secondary | ICD-10-CM

## 2023-06-24 NOTE — ED Provider Progress Notes (Signed)
ED Provider Progress Note    Patient has been called multiple times here in the emergency department patient has not responded patient left prior to evaluation after triage.          [address] [place], DO, 06/24/2023, 5:26 PM     Sury Wentworth, DO  06/24/23 1726

## 2023-06-24 NOTE — ED Procedure Documentation (Signed)
Procedures   Foreign body removal    Date/Time: 06/24/2023 6:47 PM    Performed by: Jefferey Pica, PA  Authorized by: Jefferey Pica, PA    Consent:     Consent obtained:  Verbal    Consent given by:  Patient    Risks discussed:  Bleeding, infection and pain  Universal protocol:     Patient identity confirmed:  Verbally with patient  Location:     Location:  Ear    Ear location:  R ear    Depth:  Intradermal  Pre-procedure details:     Imaging:  None  Anesthesia:     Anesthesia method:  Local infiltration    Local anesthetic:  Lidocaine 1% w/o epi  Procedure type:     Procedure complexity:  Simple  Procedure details:     Incision length:  Stab incision    Localization method:  Visualized    Dissection of underlying tissues: no      Bloodless field: yes      Removal mechanism:  Hemostat    Foreign bodies recovered:  1    Description:  Large silver hoop earing    Intact foreign body removal: yes    Post-procedure details:     Confirmation:  No additional foreign bodies on visualization    Skin closure:  None    Dressing:  Open (no dressing)    Procedure completion:  Tolerated well, no immediate complications      Jefferey Pica, PA     [place], [place]  06/24/23 2219

## 2023-06-24 NOTE — ED Triage Notes (Signed)
Patient to ED for foreign object in right ear. Patient was triage earlier today but left before being seen after triage.

## 2023-06-24 NOTE — ED Notes (Signed)
Patient given AVS & hydrogen peroxide. Vitals obtained. Discharged from WR prior to nursing assessment being obtained.

## 2023-06-24 NOTE — ED Provider Notes (Signed)
History     Chief Complaint   Patient presents with    Foreign Body in Ear     Patient is a 32 year old female with history of anxiety and depression, substance use who presents to the emergency department for foreign body in her right ear.  Patient states that 2 days ago she placed large hoop earrings in her ears.  Today, she was unable to remove the right earring.  She states that she is having pain in her earlobe.      History provided by:  Patient  Language interpreter used: No          Medical/Surgical/Family History     Past Medical History:   Diagnosis Date    Anxiety     Asthma     Depression     Gestational diabetes     Preeclampsia     Scabies     Substance abuse         Patient Active Problem List   Diagnosis Code    Paranoia F22    Scabies B86    High-risk pregnancy O09.90    PTSD (post-traumatic stress disorder) F43.10    History of cesarean section complicating pregnancy O34.219    H/O fetal anomaly in prior pregnancy, currently pregnant O09.299    History of gestational diabetes in prior pregnancy, currently pregnant O09.299, Z86.32    Hx of preeclampsia, prior pregnancy, currently pregnant O09.299    Substance use disorder F19.90    Homeless Z59.00    Asthma during pregnancy O99.519, J45.909    Trichomonas infection A59.9    Staph aureus infection A49.01    Rash of hands R21    Suicidal ideation R45.851    Rubella non-immune status, antepartum O09.899, Z28.39    GBS (group B Streptococcus carrier), +RV culture, currently pregnant O99.820    History of cesarean delivery Z98.891    Complex care coordination Z71.89            Past Surgical History:   Procedure Laterality Date    CESAREAN SECTION, LOW TRANSVERSE      Left leg surgery      TONSILLECTOMY AND ADENOIDECTOMY            Social History     Tobacco Use    Smoking status: Every Day     Packs/day: .5     Types: Cigarettes     Start date: 2002    Smokeless tobacco: Former     Types: Snuff, Chew   Substance Use Topics    Alcohol use: Yes    Drug  use: Yes     Types: Cocaine, Methamphetamines, IV, Heroin, Marijuana             Review of Systems   Reason unable to perform ROS: See HPI.       Physical Exam     Triage Vitals  Triage Start: Start, (06/24/23 1847)  First Recorded BP: 122/88, Resp: 18, Temp: 36.2 C (97.2 F), Temp src: Infrared Oxygen Therapy SpO2: 97 %, Oximetry Source: Rt Hand, O2 Device: None (Room air), Heart Rate: 85, (06/24/23 1848) Heart Rate (via Pulse Ox): 85, (06/24/23 1848).  First Pain Reported  0-10 Scale: 0, (06/24/23 1850)       Physical Exam  Vitals reviewed.   Constitutional:       Appearance: Normal appearance. She is normal weight.   HENT:      Head: Normocephalic and atraumatic.      Ears:  Comments: R ear with large hoop earring stuck in site of ear piercing. Small amount of dried blood posteriorly.   Pulmonary:      Effort: Pulmonary effort is normal.   Neurological:      Mental Status: She is alert.         Medical Decision Making   Patient seen by me on:  06/24/2023    Assessment:  Patient is a 32 year old female with history of anxiety and depression, substance use who presents to the emergency department for foreign body in her right ear. On exam there is a large hoop earring stuck in the R ear.     Differential diagnosis:  Foreign body in ear  Wound          Plan:  Physical exam  Removal procedure      ED Course and Disposition:  Patient was seen in the ED and evaluated for a foreign body in her right ear.  On exam, there is a large hoop earring stuck in the site of the right ear piercing.  I attempted to dislodge the earring however patient is in too much pain.  The area was numbed with lidocaine and a small incision was made posteriorly with successful removal.  Please see additional procedure note for further details.  Patient advised to apply hydrogen peroxide to the ear twice daily and do not replace earrings in the ear until it is fully healed.  Return precautions provided.  Stable for discharge.               Jefferey Pica, PA            [place], [place], [place]  06/24/23 2216

## 2023-06-24 NOTE — Discharge Instructions (Addendum)
You were seen today for a foreign body stuck in your right earlobe.  Your earring was removed.  You were given a bottle of hydrogen peroxide.  Please clean your earlobe twice daily to prevent infection.  Do not put any earrings in your ear until it has completely healed.  You will likely need to get your ear repierced.    If you experience any redness, increased drainage, fevers, chills or any other symptoms that are concerning to you do not hesitate to return to the emergency department.

## 2023-06-24 NOTE — ED Triage Notes (Signed)
Pt coming in from home via EMS for an earring inside her ear canal. States she was trying to put an earring in when it entered her ear canal. Endorses right ear pain.     Prehospital medications given: No

## 2023-06-26 ENCOUNTER — Other Ambulatory Visit: Payer: Self-pay

## 2023-06-28 ENCOUNTER — Other Ambulatory Visit: Payer: Self-pay

## 2023-06-28 NOTE — Progress Notes (Signed)
Pt called Cm and stated she had court on Wednesday June 26 and needed to reschedule enrollment date. Enrollment was rescheduled for June 26, 2023.        Wynelle Beckmann, Care Manager [phone]

## 2023-06-28 NOTE — Progress Notes (Signed)
CM received multiple calls from pt in the middle of the night from different phone numbers. Pt left a message stating her phone number was disconnected and this number she was calling me on was wifi only. She was calling to reschedule our enrollment scheduled for later on today.    CM returned pt's call, there was no answer.        Wynelle Beckmann, Care Manager [phone]

## 2023-06-29 ENCOUNTER — Other Ambulatory Visit: Payer: Self-pay

## 2023-06-30 ENCOUNTER — Other Ambulatory Visit: Payer: Self-pay

## 2023-06-30 ENCOUNTER — Encounter: Payer: Self-pay | Admitting: Medical

## 2023-06-30 ENCOUNTER — Emergency Department
Admission: EM | Admit: 2023-06-30 | Discharge: 2023-07-01 | Disposition: A | Discharge status: Home / Self Care | Payer: Medicaid Other | Source: Ambulatory Visit | Attending: Student in an Organized Health Care Education/Training Program | Admitting: Student in an Organized Health Care Education/Training Program

## 2023-06-30 DIAGNOSIS — F17210 Nicotine dependence, cigarettes, uncomplicated: Secondary | ICD-10-CM | POA: Insufficient documentation

## 2023-06-30 DIAGNOSIS — Z202 Contact with and (suspected) exposure to infections with a predominantly sexual mode of transmission: Secondary | ICD-10-CM | POA: Insufficient documentation

## 2023-06-30 DIAGNOSIS — N898 Other specified noninflammatory disorders of vagina: Secondary | ICD-10-CM
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
## 2023-07-01 ENCOUNTER — Encounter: Payer: Self-pay | Admitting: Medical

## 2023-07-01 LAB — URINALYSIS REFLEX TO CULTURE
Blood,UA: NEGATIVE
Glucose,UA: NEGATIVE
Leuk Esterase,UA: NEGATIVE
Nitrite,UA: NEGATIVE
Protein,UA: NEGATIVE
Specific Gravity,UA: 1.032 — ABNORMAL HIGH (ref 1.002–1.030)
pH,UA: 6 (ref 5.0–8.0)

## 2023-07-01 LAB — POCT URINE PREGNANCY
Lot #: 732331
Preg Test,UR POC: NEGATIVE

## 2023-07-01 LAB — N. GONORRHOEAE NAAT (PCR): N. gonorrhoeae NAAT (PCR): NEGATIVE

## 2023-07-01 LAB — VAGINITIS SCREEN: DNA PROBE: Vaginitis Screen:DNA Probe: 0

## 2023-07-01 LAB — CHLAMYDIA NAAT (PCR): Chlamydia NAAT (PCR): NEGATIVE

## 2023-07-01 MED ORDER — CEFTRIAXONE SODIUM 500 MG IJ SOLR *I*
500.0000 mg | Freq: Once | INTRAMUSCULAR | Status: AC
Start: 2023-07-01 — End: 2023-07-01
  Administered 2023-07-01: 500 mg via INTRAMUSCULAR
  Filled 2023-07-01: qty 5

## 2023-07-01 MED ORDER — KETOROLAC TROMETHAMINE 30 MG/ML IJ SOLN *I*
15.0000 mg | Freq: Once | INTRAMUSCULAR | Status: AC
Start: 2023-07-01 — End: 2023-07-01
  Administered 2023-07-01: 15 mg via INTRAMUSCULAR
  Filled 2023-07-01: qty 1

## 2023-07-01 MED ORDER — AZITHROMYCIN 250 MG PO TABS *I*
1000.0000 mg | ORAL_TABLET | Freq: Once | ORAL | Status: AC
Start: 2023-07-01 — End: 2023-07-01
  Administered 2023-07-01: 1000 mg via ORAL
  Filled 2023-07-01: qty 4
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
## 2023-07-01 NOTE — Discharge Instructions (Signed)
You were seen in the Emergency Department today for evaluation of STD exposure. Based on our evaluation it appears that you are stable for discharge.      Urine shows no evidence of infection.  STD testing was obtained.  You had antibiotics empirically given to you for STDs.    It is important to obtain follow up with your primary care physician whenever you have been evaluated in the Emergency Department. Please call your doctor's office and schedule an appointment.     Return to the Emergency Department if your symptoms persist or worsen - especially if you experience chest pain, trouble breathing.     Please return if you have any concerns and are unable to contact your PCP.     Thank you for allowing me to care for you today. Hope that you feel better soon.

## 2023-07-08 ENCOUNTER — Telehealth: Payer: Self-pay
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
## 2023-07-09 ENCOUNTER — Other Ambulatory Visit: Payer: Self-pay

## 2023-08-07 ENCOUNTER — Telehealth: Payer: Self-pay | Admitting: Emergency Medicine
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
## 2023-08-17 ENCOUNTER — Encounter: Payer: Self-pay | Admitting: Emergency Medicine

## 2023-08-17 ENCOUNTER — Emergency Department
Admission: EM | Admit: 2023-08-17 | Discharge: 2023-08-17 | Disposition: A | Discharge status: Home / Self Care | Payer: Medicaid Other | Source: Ambulatory Visit | Attending: Emergency Medicine | Admitting: Emergency Medicine

## 2023-08-17 ENCOUNTER — Other Ambulatory Visit: Payer: Self-pay

## 2023-08-17 DIAGNOSIS — L299 Pruritus, unspecified: Secondary | ICD-10-CM | POA: Insufficient documentation

## 2023-08-17 MED ORDER — PERMETHRIN 5 % EX CREA *I*
TOPICAL_CREAM | Freq: Once | CUTANEOUS | 0 refills | Status: DC
Start: 2023-08-17 — End: 2023-08-17

## 2023-08-17 MED ORDER — HYDROXYZINE HCL 25 MG PO TABS *I*
25.0000 mg | ORAL_TABLET | Freq: Three times a day (TID) | ORAL | 0 refills | Status: AC | PRN
Start: 2023-08-17 — End: ?
  Filled 2023-08-17: qty 20, 7d supply, fill #0

## 2023-08-17 MED ORDER — DIPHENHYDRAMINE HCL 25 MG ORAL SOLID *WRAPPED*
25.0000 mg | Freq: Once | ORAL | Status: DC
Start: 2023-08-17 — End: 2023-08-17

## 2023-08-17 MED ORDER — PERMETHRIN 5 % EX CREA *I*
TOPICAL_CREAM | Freq: Once | CUTANEOUS | 1 refills | Status: AC
Start: 2023-08-17 — End: 2023-08-21
  Filled 2023-08-17: qty 60, 1d supply, fill #0

## 2023-08-17 MED ORDER — LORATADINE 10 MG PO TABS *I*
10.0000 mg | ORAL_TABLET | Freq: Every day | ORAL | 0 refills | Status: DC
Start: 2023-08-17 — End: 2023-08-17

## 2023-08-17 MED ORDER — PERMETHRIN 5 % EX CREA *I*
TOPICAL_CREAM | Freq: Once | CUTANEOUS | 0 refills | Status: DC
Start: 2023-08-17 — End: 2023-08-17
  Filled 2023-08-17: qty 60, fill #0

## 2023-08-17 MED ORDER — HYDROXYZINE HCL 25 MG PO TABS *I*
25.0000 mg | ORAL_TABLET | Freq: Three times a day (TID) | ORAL | 0 refills | Status: DC | PRN
Start: 2023-08-17 — End: 2023-08-17

## 2023-08-17 MED ORDER — LORATADINE 10 MG PO TABS *I*
10.0000 mg | ORAL_TABLET | Freq: Every day | ORAL | 0 refills | Status: AC
Start: 2023-08-17 — End: 2023-08-24
  Filled 2023-08-17: qty 7, 7d supply, fill #0

## 2023-08-17 NOTE — ED Provider Notes (Signed)
History     Chief Complaint   Patient presents with    Pruritis     32 y/o F with PMH of substance abuse, depression, anxiety, asthma, scabies, presents for evaluation of pruritus. She is staying with someone and believes she picked up some infection and it's causing her to be itching all over. She was initially seen at [facility] but left because they wanted to do a psych eval. She is not suicidal. Denies new soaps/detergents.       History provided by:  Patient        Medical/Surgical/Family History     Past Medical History:   Diagnosis Date    Anxiety     Asthma     Depression     Gestational diabetes     Preeclampsia     Scabies     Substance abuse         Patient Active Problem List   Diagnosis Code    Paranoia F22    Scabies B86    High-risk pregnancy O09.90    PTSD (post-traumatic stress disorder) F43.10    History of cesarean section complicating pregnancy O34.219    H/O fetal anomaly in prior pregnancy, currently pregnant O09.299    History of gestational diabetes in prior pregnancy, currently pregnant O09.299, Z86.32    Hx of preeclampsia, prior pregnancy, currently pregnant O09.299    Substance use disorder F19.90    Homeless Z59.00    Asthma during pregnancy O99.519, J45.909    Trichomonas infection A59.9    Staph aureus infection A49.01    Rash of hands R21    Suicidal ideation R45.851    Rubella non-immune status, antepartum O09.899, Z28.39    GBS (group B Streptococcus carrier), +RV culture, currently pregnant O99.820    History of cesarean delivery Z98.891    Complex care coordination Z71.89            Past Surgical History:   Procedure Laterality Date    CESAREAN SECTION, LOW TRANSVERSE      Left leg surgery      TONSILLECTOMY AND ADENOIDECTOMY            Social History     Tobacco Use    Smoking status: Every Day     Packs/day: .5     Types: Cigarettes     Start date: 2002    Smokeless tobacco: Former     Types: Snuff, Chew   Substance Use Topics    Alcohol use: Yes    Drug use: Yes     Types: Cocaine,  Methamphetamines, IV, Heroin, Marijuana             Review of Systems    Physical Exam     Triage Vitals  Triage Start: Start, (08/17/23 1259)  First Recorded BP: 110/58, Resp: 18, Temp: 36.8 C (98.2 F) Oxygen Therapy SpO2: 99 %, O2 Device: None (Room air), Heart Rate: 82, (08/17/23 1300)  .  First Pain Reported  0-10 Scale: 0, (08/17/23 1300)       Physical Exam  Vitals and nursing note reviewed.   Constitutional:       General: She is not in acute distress.     Appearance: She is well-developed.   HENT:      Head: Normocephalic and atraumatic.   Eyes:      Conjunctiva/sclera: Conjunctivae normal.   Cardiovascular:      Rate and Rhythm: Normal rate and regular rhythm.      Heart  sounds: Normal heart sounds.   Pulmonary:      Effort: Pulmonary effort is normal. No respiratory distress.   Musculoskeletal:         General: No deformity.   Skin:     General: Skin is warm and dry.      Comments: There is some dry cracked skin in the interdigital spaces. No erythematous lesions, no urticaria.   Neurological:      Mental Status: She is alert and oriented to person, place, and time.      Comments: Moves all 4 extremities without focal neurologic deficits   Psychiatric:         Behavior: Behavior is cooperative.         Thought Content: Thought content is paranoid. Thought content does not include suicidal ideation.         Medical Decision Making   Patient seen by me on:  08/17/2023    Assessment:  32 y/o F presents for evaluation of pruritus. She is asking for steroid and antibiotic ointments, as well as treatment for scabies. She is not suicidal, and although she has heightened paranoia for germs I don't think she is acutely psychotic and requires stat psych eval.     Differential diagnosis:  Scabies, dry skin, eczema, allergic dermatitis    Plan:  Rx for Hydroxyzine, Claritin, and Permethrin for symptom management.   Stable for d/c.              Concha Se, MD            Concha Se, MD  08/17/23 1734
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
## 2023-08-17 NOTE — Discharge Instructions (Signed)
Sometimes skin can itch because it is dry. Use ointments and creams to hydrate your skin. Take Hydroxyzine and Claritin to help manage your symptoms. Take Permethrin due to concern for scabies.

## 2023-08-18 ENCOUNTER — Other Ambulatory Visit: Payer: Self-pay

## 2023-08-19 ENCOUNTER — Other Ambulatory Visit: Payer: Self-pay

## 2023-08-19 ENCOUNTER — Emergency Department
Admission: EM | Admit: 2023-08-19 | Discharge: 2023-08-20 | Discharge status: Against Medical Advice | Payer: Medicaid Other | Source: Ambulatory Visit

## 2023-08-19 NOTE — ED Triage Notes (Signed)
Patient to ED via EMS with shortness of breath X two hours. Endorses dry cough. Clear lung sounds per EMS. Per patient "rash" to hands - will not remove gloves.     Prehospital medications given: No

## 2023-08-20 ENCOUNTER — Other Ambulatory Visit: Payer: Self-pay

## 2023-08-20 ENCOUNTER — Emergency Department
Admission: EM | Admit: 2023-08-20 | Discharge: 2023-08-20 | Disposition: A | Discharge status: Home / Self Care | Payer: Medicaid Other | Source: Ambulatory Visit | Attending: Student in an Organized Health Care Education/Training Program | Admitting: Student in an Organized Health Care Education/Training Program

## 2023-08-20 DIAGNOSIS — L03012 Cellulitis of left finger: Secondary | ICD-10-CM | POA: Insufficient documentation

## 2023-08-20 DIAGNOSIS — L03011 Cellulitis of right finger: Secondary | ICD-10-CM | POA: Insufficient documentation

## 2023-08-20 DIAGNOSIS — L299 Pruritus, unspecified: Secondary | ICD-10-CM | POA: Insufficient documentation

## 2023-08-20 MED ORDER — CEPHALEXIN 500 MG PO CAPS *I*
500.0000 mg | ORAL_CAPSULE | Freq: Four times a day (QID) | ORAL | 0 refills | Status: AC
  Filled 2023-08-20: qty 20, 5d supply, fill #0

## 2023-08-20 NOTE — ED Triage Notes (Signed)
Patient to ED via EMS with shortness of breath & rash to hands - will not remove gloves.     Prehospital medications given: Yes  Respiratory/Allergic Reaction: DuoNeb; Albuterol (X2)

## 2023-08-20 NOTE — ED Provider Notes (Signed)
History     Chief Complaint   Patient presents with    Shortness of Breath     32 year old female with a history of anxiety, asthma, depression and polysubstance use presents to the emergency department with concerns for a rash to her bilateral hands.  When she initially called for EMS support she reported feeling short of breath.  EMS gave her a DuoNeb and albuterol x 2.  Since arriving to the emergency department she reports that her symptoms have resolved.  She presents concerned that the rash on her hands is not gone away.  She was seen in the emergency department on 08/17/2023.  At that time most likely diagnosed with scabies and prescribed Claritin, hydroxyzine and permethrin cream.  She has not picked up any of these prescriptions because she "has not had the chance".  She arrives wearing gloves.  Denies fevers or chills.      Medical/Surgical/Family History     Past Medical History:   Diagnosis Date    Anxiety     Asthma     Depression     Gestational diabetes     Preeclampsia     Scabies     Substance abuse         Patient Active Problem List   Diagnosis Code    Paranoia F22    Scabies B86    High-risk pregnancy O09.90    PTSD (post-traumatic stress disorder) F43.10    History of cesarean section complicating pregnancy O34.219    H/O fetal anomaly in prior pregnancy, currently pregnant O09.299    History of gestational diabetes in prior pregnancy, currently pregnant O09.299, Z86.32    Hx of preeclampsia, prior pregnancy, currently pregnant O09.299    Substance use disorder F19.90    Homeless Z59.00    Asthma during pregnancy O99.519, J45.909    Trichomonas infection A59.9    Staph aureus infection A49.01    Rash of hands R21    Suicidal ideation R45.851    Rubella non-immune status, antepartum O09.899, Z28.39    GBS (group B Streptococcus carrier), +RV culture, currently pregnant O99.820    History of cesarean delivery Z98.891    Complex care coordination Z71.89            Past Surgical History:    Procedure Laterality Date    CESAREAN SECTION, LOW TRANSVERSE      Left leg surgery      TONSILLECTOMY AND ADENOIDECTOMY            Social History     Tobacco Use    Smoking status: Every Day     Packs/day: .5     Types: Cigarettes     Start date: 2002    Smokeless tobacco: Former     Types: Snuff, Chew   Substance Use Topics    Alcohol use: Yes    Drug use: Yes     Types: Cocaine, Methamphetamines, IV, Heroin, Marijuana             Review of Systems    Physical Exam     Triage Vitals  Triage Start: Start, (08/20/23 0225)  First Recorded BP: 114/63, Resp: 20, Temp: 36.2 C (97.2 F), Temp src: Infrared Oxygen Therapy SpO2: 100 %, Oximetry Source: Rt Hand, O2 Device: None (Room air), O2 Therapy: Oxymask, Heart Rate: 81, (08/20/23 0226)  .  First Pain Reported  0-10 Scale: 0, Pain Location/Orientation: Hand Left;Hand Right, (08/20/23 4540)       Physical Exam  Vitals and nursing note reviewed.   HENT:      Mouth/Throat:      Mouth: Mucous membranes are moist.   Cardiovascular:      Rate and Rhythm: Normal rate and regular rhythm.   Pulmonary:      Effort: Pulmonary effort is normal.      Breath sounds: Normal breath sounds.   Chest:      Chest wall: No tenderness.   Abdominal:      General: Bowel sounds are normal.      Palpations: Abdomen is soft.   Skin:     General: Skin is warm and dry.      Capillary Refill: Capillary refill takes less than 2 seconds.      Comments: Rash in the intertriginous folds of her bilateral hands.  Some breakdown of the skin.  See attached photos.   Neurological:      Mental Status: She is alert and oriented to person, place, and time.   Psychiatric:         Mood and Affect: Mood is anxious.         Behavior: Behavior normal.               Medical Decision Making   Patient seen by me on:  08/20/2023    Assessment:  32 year old female here with a rash between her fingers of the bilateral hands.  She arrives wearing latex gloves.  Some of the breakdown of the skin is likely moisture  related.  I counseled the patient on avoiding excessive use of gloves at home as this may be worsening the rash.  I also strongly encouraged the patient to pick up her prescribed medications to assist with the pruritus and discomfort as well as treatment of possible scabies.  Given the now breakdown of the skin she seems to be developing some localized cellulitis.  I will prescribe her a course of Keflex to assist with this.  She is preoccupied with wearing the gloves.  She has a history of psychosis but is not demonstrating any current signs of severe paranoia, suicidality or homicidality.  She is not an imminent danger to herself.  The patient states that she will go directly from the emergency department to pick up her prescriptions from today and her ED visit from 8/19.    Differential diagnosis:  Cellulitis, scabies, fungal infection    Plan:  Orders Placed This Encounter      cephalexin (KEFLEX) 500 mg capsule        ED Course and Disposition:  Patient discharged in stable condition.  Will go home via medic cab.           Yuval Rubens Vergia Alcon, DO            [place], [place], [place]  08/20/23 [phone]

## 2023-08-20 NOTE — ED Notes (Signed)
Patient left.

## 2023-08-20 NOTE — Discharge Instructions (Signed)
You were seen for a rash on your hands.     I have prescribed antibiotics to help with the infection. Please take these as prescribed.    You should also take the cream prescribed to you during your ED visit on 08/17/23.     Please call 911 or return to the ER if you develop any of the following symptoms:    You see blood in your vomit or your bowel movements.  You have sudden, severe pain in your chest and upper abdomen after hard vomiting or retching.  You have swelling in your neck and chest.   You are dizzy, cold, and thirsty and your eyes and mouth are dry.  You are urinating very little or not at all.  You have muscle weakness, leg cramps, and trouble breathing.   Your heart is beating much faster than normal.   You continue to vomit for more than 48 hours.  You are unable to keep down food or fluids for an extended period of time.   You develop severe fevers.   You have worsening of your condition or develop other medical concerns.
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
## 2023-09-04 ENCOUNTER — Ambulatory Visit: Payer: Self-pay

## 2023-09-05 ENCOUNTER — Emergency Department
Admission: EM | Admit: 2023-09-05 | Discharge: 2023-09-06 | Disposition: A | Discharge status: Home / Self Care | Payer: Medicaid Other | Source: Ambulatory Visit | Attending: Emergency Medicine | Admitting: Emergency Medicine

## 2023-09-05 DIAGNOSIS — Z202 Contact with and (suspected) exposure to infections with a predominantly sexual mode of transmission: Secondary | ICD-10-CM | POA: Insufficient documentation

## 2023-09-05 DIAGNOSIS — R1030 Lower abdominal pain, unspecified: Secondary | ICD-10-CM

## 2023-09-05 DIAGNOSIS — R102 Pelvic and perineal pain: Secondary | ICD-10-CM

## 2023-09-05 DIAGNOSIS — R300 Dysuria: Secondary | ICD-10-CM

## 2023-09-05 MED ORDER — AZITHROMYCIN 250 MG PO TABS *I*
1000.0000 mg | ORAL_TABLET | Freq: Once | ORAL | Status: DC
Start: 2023-09-06 — End: 2023-09-05

## 2023-09-05 MED ORDER — CEFTRIAXONE SODIUM 500 MG IJ SOLR *I*
500.0000 mg | Freq: Once | INTRAMUSCULAR | Status: AC
Start: 2023-09-06 — End: 2023-09-06
  Administered 2023-09-06: 500 mg via INTRAMUSCULAR
  Filled 2023-09-05: qty 5

## 2023-09-05 MED ORDER — METRONIDAZOLE 500 MG PO TABS *I*
500.0000 mg | ORAL_TABLET | Freq: Two times a day (BID) | ORAL | 0 refills | Status: AC
Start: 2023-09-05 — End: 2023-09-12

## 2023-09-05 MED ORDER — AZITHROMYCIN 250 MG PO TABS *I*
1000.0000 mg | ORAL_TABLET | Freq: Once | ORAL | Status: AC
Start: 2023-09-06 — End: 2023-09-06
  Administered 2023-09-06: 1000 mg via ORAL
  Filled 2023-09-05: qty 4
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
## 2023-09-05 NOTE — Discharge Instructions (Addendum)
You were seen today with concerns for sexually transmitted infections.  You had a pelvic exam which was reassuring.  You were empirically treated for sexually transmitted infections.  1 of these medications was sent to your pharmacy and you should take twice daily for 7 days.    You should follow-up closely with your primary care provider and OB/GYN.    If you experience any worsening abdominal/pelvic pain, fevers, chills or any other symptoms that are concerning to you do not hesitate to return to the emergency department.
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
## 2023-09-06 ENCOUNTER — Other Ambulatory Visit: Payer: Self-pay

## 2023-09-06 LAB — CHLAMYDIA NAAT (PCR): Chlamydia NAAT (PCR): NEGATIVE

## 2023-09-06 LAB — SYPHILIS SCREEN
Syphilis Screen: NEGATIVE
Syphilis Status: NONREACTIVE

## 2023-09-06 LAB — N. GONORRHOEAE NAAT (PCR): N. gonorrhoeae NAAT (PCR): NEGATIVE

## 2023-09-06 LAB — VAGINITIS SCREEN: DNA PROBE: Vaginitis Screen:DNA Probe: 0

## 2023-09-06 LAB — HIV-1/2  ANTIGEN/ANTIBODY SCREEN WITH CONFIRMATION: HIV 1&2 ANTIGEN/ANTIBODY: NONREACTIVE

## 2023-09-07 ENCOUNTER — Other Ambulatory Visit: Payer: Self-pay

## 2023-09-07 ENCOUNTER — Emergency Department
Admission: EM | Admit: 2023-09-07 | Discharge: 2023-09-08 | Disposition: A | Discharge status: Home / Self Care | Payer: PRIVATE HEALTH INSURANCE | Source: Ambulatory Visit | Attending: Emergency Medicine | Admitting: Emergency Medicine

## 2023-09-07 DIAGNOSIS — Z23 Encounter for immunization: Secondary | ICD-10-CM | POA: Insufficient documentation

## 2023-09-07 DIAGNOSIS — X58XXXA Exposure to other specified factors, initial encounter: Secondary | ICD-10-CM | POA: Insufficient documentation

## 2023-09-07 DIAGNOSIS — T7621XA Adult sexual abuse, suspected, initial encounter: Secondary | ICD-10-CM | POA: Insufficient documentation

## 2023-09-07 DIAGNOSIS — Y998 Other external cause status: Secondary | ICD-10-CM | POA: Insufficient documentation

## 2023-09-07 DIAGNOSIS — Z113 Encounter for screening for infections with a predominantly sexual mode of transmission: Secondary | ICD-10-CM | POA: Insufficient documentation

## 2023-09-07 DIAGNOSIS — Y9389 Activity, other specified: Secondary | ICD-10-CM | POA: Insufficient documentation

## 2023-09-07 DIAGNOSIS — T7421XA Adult sexual abuse, confirmed, initial encounter: Secondary | ICD-10-CM

## 2023-09-07 DIAGNOSIS — Y9289 Other specified places as the place of occurrence of the external cause: Secondary | ICD-10-CM | POA: Insufficient documentation

## 2023-09-07 DIAGNOSIS — Z114 Encounter for screening for human immunodeficiency virus [HIV]: Secondary | ICD-10-CM | POA: Insufficient documentation
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
## 2023-09-07 NOTE — ED Triage Notes (Signed)
Writer attempted to called J.Erskine-Pettit, PA. Message left with callback number.
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
## 2023-09-07 NOTE — ED Triage Notes (Signed)
Spoke with Marlise Eves, PA who states there will be a Publishing rights manager available at 2000, and to contact this nurse through the paging system at that time.

## 2023-09-08 ENCOUNTER — Encounter: Payer: Self-pay | Admitting: Emergency Medicine

## 2023-09-08 LAB — COMPREHENSIVE METABOLIC PANEL
ALT: 8 U/L (ref 0–35)
AST: 23 U/L (ref 0–35)
Albumin: 4 g/dL (ref 3.5–5.2)
Alk Phos: 71 U/L (ref 35–105)
Anion Gap: 10 (ref 7–16)
Bilirubin,Total: 0.7 mg/dL (ref 0.0–1.2)
CO2: 22 mmol/L (ref 20–28)
Calcium: 9.1 mg/dL (ref 8.8–10.2)
Chloride: 106 mmol/L (ref 96–108)
Creatinine: 0.75 mg/dL (ref 0.51–0.95)
Glucose: 83 mg/dL (ref 60–99)
Lab: 14 mg/dL (ref 6–20)
Potassium: 3.8 mmol/L (ref 3.3–5.1)
Sodium: 138 mmol/L (ref 133–145)
Total Protein: 6.7 g/dL (ref 6.3–7.7)
eGFR BY CREAT: 108 *

## 2023-09-08 LAB — PREGNANCY TEST, SERUM: Preg,Serum: NEGATIVE

## 2023-09-08 LAB — HIV-1/2  ANTIGEN/ANTIBODY SCREEN WITH CONFIRMATION: HIV 1&2 ANTIGEN/ANTIBODY: NONREACTIVE

## 2023-09-08 LAB — VAGINITIS SCREEN: DNA PROBE: Vaginitis Screen:DNA Probe: POSITIVE — AB

## 2023-09-08 LAB — CHLAMYDIA NAAT (PCR): Chlamydia NAAT (PCR): NEGATIVE

## 2023-09-08 LAB — N. GONORRHOEAE NAAT (PCR): N. gonorrhoeae NAAT (PCR): NEGATIVE

## 2023-09-08 LAB — SYPHILIS SCREEN
Syphilis Screen: NEGATIVE
Syphilis Status: NONREACTIVE

## 2023-09-08 MED ORDER — ONDANSETRON 4 MG PO TBDP *I*
4.0000 mg | ORAL_TABLET | Freq: Once | ORAL | Status: AC
Start: 2023-09-08 — End: 2023-09-08
  Administered 2023-09-08: 4 mg via ORAL
  Filled 2023-09-08: qty 1

## 2023-09-08 MED ORDER — (HIV PEP KIT) EMTRICITABINE-TENOFOVIR 200-300 MG PO TABS *I*
1.0000 | ORAL_TABLET | Freq: Every day | ORAL | Status: DC
Start: 2023-09-08 — End: 2023-09-08
  Administered 2023-09-08: 1 via ORAL
  Filled 2023-09-08: qty 1

## 2023-09-08 MED ORDER — HEPATITIS B VAC RECOMBINANT 20 MCG/ML IJ VIAL/SYR *WRAPPED*
20.0000 ug | Freq: Once | INTRAMUSCULAR | Status: AC
Start: 2023-09-08 — End: 2023-09-08
  Administered 2023-09-08: 20 ug via INTRAMUSCULAR
  Filled 2023-09-08: qty 1

## 2023-09-08 MED ORDER — CEFTRIAXONE SODIUM 500 MG IJ SOLR *I*
500.0000 mg | Freq: Once | INTRAMUSCULAR | Status: AC
Start: 2023-09-08 — End: 2023-09-08
  Administered 2023-09-08: 500 mg via INTRAMUSCULAR
  Filled 2023-09-08: qty 5

## 2023-09-08 MED ORDER — AZITHROMYCIN 250 MG PO TABS *I*
1000.0000 mg | ORAL_TABLET | Freq: Every day | ORAL | Status: DC
Start: 2023-09-08 — End: 2023-09-08
  Administered 2023-09-08: 1000 mg via ORAL
  Filled 2023-09-08: qty 4

## 2023-09-08 MED ORDER — METRONIDAZOLE 250 MG PO TABS *I*
2000.0000 mg | ORAL_TABLET | Freq: Once | ORAL | Status: AC
Start: 2023-09-08 — End: 2023-09-08
  Administered 2023-09-08: 2000 mg via ORAL
  Filled 2023-09-08: qty 8

## 2023-09-08 MED ORDER — ULIPRISTAL ACETATE 30 MG PO TABS *I*
30.0000 mg | ORAL_TABLET | Freq: Once | ORAL | Status: AC
Start: 2023-09-08 — End: 2023-09-08
  Administered 2023-09-08: 30 mg via ORAL
  Filled 2023-09-08: qty 1

## 2023-09-08 MED ORDER — TETANUS-DIPHTH-ACELL PERT 5-2.5-18.5 LF-MCG/0.5 IM SUSP *WRAPPED*
0.5000 mL | Freq: Once | INTRAMUSCULAR | Status: AC
Start: 2023-09-08 — End: 2023-09-08
  Administered 2023-09-08: 0.5 mL via INTRAMUSCULAR
  Filled 2023-09-08: qty 0.5

## 2023-09-08 MED ORDER — (HIV PEP KIT) DOLUTEGRAVIR SODIUM 50 MG PO TABS *I*
50.0000 mg | ORAL_TABLET | Freq: Every day | ORAL | Status: DC
Start: 2023-09-08 — End: 2023-09-08
  Administered 2023-09-08: 50 mg via ORAL
  Filled 2023-09-08: qty 1
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
## 2023-09-15 ENCOUNTER — Encounter: Payer: Self-pay | Admitting: Emergency Medicine

## 2023-09-15 ENCOUNTER — Emergency Department
Admission: EM | Admit: 2023-09-15 | Discharge: 2023-09-16 | Disposition: A | Discharge status: Home / Self Care | Payer: PRIVATE HEALTH INSURANCE | Source: Ambulatory Visit | Attending: Emergency Medicine | Admitting: Emergency Medicine

## 2023-09-15 ENCOUNTER — Other Ambulatory Visit: Payer: Self-pay

## 2023-09-15 ENCOUNTER — Emergency Department
Admission: EM | Admit: 2023-09-15 | Discharge: 2023-09-15 | Disposition: A | Discharge status: Home / Self Care | Payer: PRIVATE HEALTH INSURANCE | Source: Ambulatory Visit | Attending: Emergency Medicine | Admitting: Emergency Medicine

## 2023-09-15 DIAGNOSIS — M791 Myalgia, unspecified site: Secondary | ICD-10-CM

## 2023-09-15 DIAGNOSIS — J45901 Unspecified asthma with (acute) exacerbation: Secondary | ICD-10-CM | POA: Insufficient documentation

## 2023-09-15 DIAGNOSIS — B349 Viral infection, unspecified: Secondary | ICD-10-CM | POA: Insufficient documentation

## 2023-09-15 DIAGNOSIS — R051 Acute cough: Secondary | ICD-10-CM

## 2023-09-15 DIAGNOSIS — Y998 Other external cause status: Secondary | ICD-10-CM | POA: Insufficient documentation

## 2023-09-15 DIAGNOSIS — T7621XA Adult sexual abuse, suspected, initial encounter: Secondary | ICD-10-CM | POA: Insufficient documentation

## 2023-09-15 DIAGNOSIS — Y9289 Other specified places as the place of occurrence of the external cause: Secondary | ICD-10-CM | POA: Insufficient documentation

## 2023-09-15 DIAGNOSIS — T7421XA Adult sexual abuse, confirmed, initial encounter: Secondary | ICD-10-CM

## 2023-09-15 DIAGNOSIS — R0602 Shortness of breath: Secondary | ICD-10-CM

## 2023-09-15 DIAGNOSIS — X58XXXA Exposure to other specified factors, initial encounter: Secondary | ICD-10-CM | POA: Insufficient documentation

## 2023-09-15 DIAGNOSIS — S50811A Abrasion of right forearm, initial encounter: Secondary | ICD-10-CM | POA: Insufficient documentation

## 2023-09-15 DIAGNOSIS — N938 Other specified abnormal uterine and vaginal bleeding: Secondary | ICD-10-CM | POA: Insufficient documentation

## 2023-09-15 DIAGNOSIS — Y9389 Activity, other specified: Secondary | ICD-10-CM | POA: Insufficient documentation

## 2023-09-15 DIAGNOSIS — F17210 Nicotine dependence, cigarettes, uncomplicated: Secondary | ICD-10-CM | POA: Insufficient documentation

## 2023-09-15 MED ORDER — PREDNISONE 20 MG PO TABS *I*
40.0000 mg | ORAL_TABLET | Freq: Every day | ORAL | 0 refills | Status: AC
Start: 2023-09-15 — End: 2023-09-20
  Filled 2023-09-15: qty 8, 4d supply, fill #0

## 2023-09-15 MED ORDER — IBUPROFEN 400 MG PO TABS *I*
400.0000 mg | ORAL_TABLET | Freq: Once | ORAL | Status: AC
Start: 2023-09-15 — End: 2023-09-15
  Administered 2023-09-15: 400 mg via ORAL
  Filled 2023-09-15: qty 1

## 2023-09-15 MED ORDER — ACETAMINOPHEN 325 MG PO TABS *I*
650.0000 mg | ORAL_TABLET | Freq: Once | ORAL | Status: AC
Start: 2023-09-15 — End: 2023-09-15
  Administered 2023-09-15: 650 mg via ORAL
  Filled 2023-09-15: qty 2

## 2023-09-15 MED ORDER — ACETAMINOPHEN 500 MG PO TABS *I*
1000.0000 mg | ORAL_TABLET | Freq: Once | ORAL | Status: AC
Start: 2023-09-15 — End: 2023-09-15
  Administered 2023-09-15: 1000 mg via ORAL
  Filled 2023-09-15: qty 2

## 2023-09-15 MED ORDER — ALBUTEROL SULFATE HFA 108 (90 BASE) MCG/ACT IN AERS *I*
1.0000 | INHALATION_SPRAY | Freq: Four times a day (QID) | RESPIRATORY_TRACT | 0 refills | Status: AC | PRN
Start: 2023-09-15 — End: 2023-10-15
  Filled 2023-09-15: qty 8.5, 25d supply, fill #0

## 2023-09-15 MED ORDER — GUAIFENESIN 100 MG/5ML PO SYRUP *WRAPPED*
100.0000 mg | ORAL_SOLUTION | Freq: Once | Status: AC
Start: 2023-09-15 — End: 2023-09-15
  Administered 2023-09-15: 100 mg via ORAL
  Filled 2023-09-15: qty 10

## 2023-09-15 MED ORDER — PREDNISONE 20 MG PO TABS *I*
60.0000 mg | ORAL_TABLET | Freq: Once | ORAL | Status: AC
Start: 2023-09-15 — End: 2023-09-15
  Administered 2023-09-15: 60 mg via ORAL
  Filled 2023-09-15: qty 3

## 2023-09-15 MED ORDER — IPRATROPIUM-ALBUTEROL 0.5-2.5 MG/3ML IN SOLN *I*
9.0000 mL | Freq: Once | RESPIRATORY_TRACT | Status: AC
Start: 2023-09-15 — End: 2023-09-16
  Administered 2023-09-15: 9 mL via RESPIRATORY_TRACT
  Filled 2023-09-15: qty 9

## 2023-09-15 NOTE — ED Triage Notes (Signed)
Patient to ED via EMS with shortness of breath & body aches since this AM.     Prehospital medications given: No

## 2023-09-15 NOTE — ED Notes (Signed)
Discharged by provider, follow up instructions reinforced.

## 2023-09-15 NOTE — Discharge Instructions (Signed)
Your exam showed some bleeding in the vagina, please follow up with the OBGYN we refer you to.    Return to the ED if you have any worsening severe pain, fever > 100.28F, or if you have other concerns.
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
## 2023-09-15 NOTE — Discharge Instructions (Addendum)
You were seen for shortness of breath.    Take prednisone as prescribed daily for 4 days.  Use your albuterol inhaler every 4 hours for 2 days then every 6 hours for 5 days.       For pain take:   Ibuprofen 400-600 mg every 6 hours as needed with food  Acetaminophen 1,000 mg every 8 hours as needed    Return to the ED if you have shortness of breath that is not getting better with your inhaler or nebulizer, chest pain, fever, coughing up blood, or any concerns.

## 2023-09-15 NOTE — ED Provider Notes (Signed)
History     Chief Complaint   Patient presents with    Shortness of Breath    Generalized Body Aches     32 year old female presents with shortness of breath.    She has had 1 day of cough, short of breath, general myalgias.  Associated with wheezing.  Denies chest pain, fever.            Medical/Surgical/Family History     Past Medical History:   Diagnosis Date    Anxiety     Asthma     Depression     Gestational diabetes     Preeclampsia     Scabies     Substance abuse         Patient Active Problem List   Diagnosis Code    Paranoia F22    Scabies B86    High-risk pregnancy O09.90    PTSD (post-traumatic stress disorder) F43.10    History of cesarean section complicating pregnancy O34.219    H/O fetal anomaly in prior pregnancy, currently pregnant O09.299    History of gestational diabetes in prior pregnancy, currently pregnant O09.299, Z86.32    Hx of preeclampsia, prior pregnancy, currently pregnant O09.299    Substance use disorder F19.90    Homeless Z59.00    Asthma during pregnancy O99.519, J45.909    Trichomonas infection A59.9    Staph aureus infection A49.01    Rash of hands R21    Suicidal ideation R45.851    Rubella non-immune status, antepartum O09.899, Z28.39    GBS (group B Streptococcus carrier), +RV culture, currently pregnant O99.820    History of cesarean delivery Z98.891    Complex care coordination Z71.89            Past Surgical History:   Procedure Laterality Date    CESAREAN SECTION, LOW TRANSVERSE      Left leg surgery      TONSILLECTOMY AND ADENOIDECTOMY            Social History     Tobacco Use    Smoking status: Every Day     Packs/day: .5     Types: Cigarettes     Start date: 2002    Smokeless tobacco: Former     Types: Snuff, Chew   Substance Use Topics    Alcohol use: Yes    Drug use: Yes     Types: Cocaine, Methamphetamines, IV, Heroin, Marijuana             Review of Systems    Physical Exam     Triage Vitals  Triage Start: Start, (09/15/23 2046)  First Recorded BP: 114/71, Resp:  18, Temp: 36.6 C (97.9 F) Oxygen Therapy SpO2: 99 %, Heart Rate: 88, (09/15/23 2046)  .  First Pain Reported  0-10 Scale: 10, Pain Location/Orientation: Generalized, (09/15/23 2046)       Physical Exam  Vitals and nursing note reviewed.   Constitutional:       Appearance: Normal appearance. She is well-developed. She is not diaphoretic.   HENT:      Head: Normocephalic and atraumatic.      Mouth/Throat:      Mouth: Mucous membranes are moist.   Eyes:      General: No scleral icterus.  Cardiovascular:      Rate and Rhythm: Normal rate and regular rhythm.      Heart sounds: Normal heart sounds. No murmur heard.     No friction rub. No gallop.   Pulmonary:  Effort: Pulmonary effort is normal. No respiratory distress.      Breath sounds: No stridor. Wheezing present. No rales.   Abdominal:      General: There is no distension.      Palpations: Abdomen is soft.      Tenderness: There is no abdominal tenderness. There is no guarding or rebound.   Musculoskeletal:         General: No tenderness (no calf tenderness b/l). Normal range of motion.   Skin:     General: Skin is warm and dry.   Neurological:      Mental Status: She is alert and oriented to person, place, and time.   Psychiatric:         Mood and Affect: Mood normal.         Medical Decision Making   Patient seen by me on:  09/15/2023    Assessment:  32 year old female presents with cough, wheezing, myalgias.  Overall presentation is most consistent with a viral syndrome.  She is end expiratory wheezing suggestive of an acute asthma exacerbation as well likely secondary to viral illness.  Overall I have a lower suspicion for pneumonia given that her breath sounds are equally wheezing on both sides.    Plan:  Will treat with DuoNebs, prednisone, Robitussin, ibuprofen and Tylenol for symptom relief  Will discharge with prednisone and albuterol             Bluford Kaufmann, DO            Bluford Kaufmann, DO  09/15/23 2335
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
## 2023-09-15 NOTE — ED Triage Notes (Signed)
Pt to the ED from home via EMS. Per EMS patient woke up from her sleep. States that she has a headache and shortness or breath. Per EMS patient has some audible wheezing. Pt continues to state, "I just don't feel too good".  Pt not answering questions for writer or EMS    Prehospital medications given: No

## 2023-09-16 ENCOUNTER — Other Ambulatory Visit: Payer: Self-pay

## 2023-09-20 ENCOUNTER — Encounter: Payer: Self-pay | Admitting: Emergency Medicine

## 2023-09-20 ENCOUNTER — Other Ambulatory Visit: Payer: Self-pay

## 2023-09-20 ENCOUNTER — Emergency Department
Admission: EM | Admit: 2023-09-20 | Discharge: 2023-09-21 | Disposition: A | Discharge status: Home / Self Care | Payer: PRIVATE HEALTH INSURANCE | Source: Ambulatory Visit | Attending: Emergency Medicine | Admitting: Emergency Medicine

## 2023-09-20 DIAGNOSIS — R21 Rash and other nonspecific skin eruption: Secondary | ICD-10-CM | POA: Insufficient documentation

## 2023-09-20 DIAGNOSIS — Z113 Encounter for screening for infections with a predominantly sexual mode of transmission: Secondary | ICD-10-CM | POA: Insufficient documentation

## 2023-09-20 DIAGNOSIS — Z114 Encounter for screening for human immunodeficiency virus [HIV]: Secondary | ICD-10-CM | POA: Insufficient documentation

## 2023-09-20 DIAGNOSIS — L309 Dermatitis, unspecified: Secondary | ICD-10-CM

## 2023-09-20 MED ORDER — BETAMETHASONE DIPROPIONATE 0.05 % EX CREA *I*
TOPICAL_CREAM | Freq: Once | CUTANEOUS | Status: AC
Start: 2023-09-21 — End: 2023-09-21
  Filled 2023-09-20: qty 45

## 2023-09-20 MED ORDER — BETAMETHASONE DIPROPIONATE AUG 0.05 % EX CREAM *A*
TOPICAL_CREAM | Freq: Two times a day (BID) | CUTANEOUS | 1 refills | Status: DC | PRN
Start: 2023-09-20 — End: 2023-09-21
  Filled 2023-09-20: qty 15, fill #0

## 2023-09-20 NOTE — Discharge Instructions (Addendum)
Apply the betamethasone ointment to your hands twice daily to clear the irritation.    Return to the ED if you notice worsening of the rash or you develop a fever    A referral to dermatology has been placed. You will be contacted in 24-48 hours to establish a follow up appointment for ongoing evaluation/management of your problem.

## 2023-09-20 NOTE — ED Triage Notes (Signed)
Patient to ED with bilateral hand pain x 2 days. Patient endorses pain, itching and dryness.     Prehospital medications given: No

## 2023-09-21 ENCOUNTER — Other Ambulatory Visit: Payer: Self-pay

## 2023-09-21 LAB — N. GONORRHOEAE NAAT (PCR): N. gonorrhoeae NAAT (PCR): NEGATIVE

## 2023-09-21 LAB — CHLAMYDIA NAAT (PCR): Chlamydia NAAT (PCR): NEGATIVE

## 2023-09-21 LAB — HIV-1/2  ANTIGEN/ANTIBODY SCREEN WITH CONFIRMATION: HIV 1&2 ANTIGEN/ANTIBODY: NONREACTIVE

## 2023-09-21 MED ORDER — BETAMETHASONE DIPROPIONATE AUG 0.05 % EX CREAM *A*
TOPICAL_CREAM | Freq: Two times a day (BID) | CUTANEOUS | 1 refills | Status: AC | PRN
Start: 2023-09-21 — End: 2023-10-21
  Filled 2023-09-21: qty 15, 7d supply, fill #0

## 2023-09-21 MED ORDER — TRIAMCINOLONE ACETONIDE 0.1 % EX CREA *I*
TOPICAL_CREAM | Freq: Two times a day (BID) | CUTANEOUS | 0 refills | Status: AC
Start: 2023-09-21 — End: 2023-10-21
  Filled 2023-09-21: qty 15, 7d supply, fill #0

## 2023-09-21 NOTE — ED Provider Notes (Signed)
History     Chief Complaint   Patient presents with    Hand Pain     32 year old female PMH of anxiety, depression, asthma, substance abuse, PTSD presents to the ED for evaluation of a rash.    Patient reports that for the past 2 days she is developed a rash overlying the dorsal aspect of her hands.  She notes that she washes her hands frequently throughout the day.  She describes the rash as pruritic and burning.    Denies fever    She is also requesting STD testing      History provided by:  Patient  Language interpreter used: No          Medical/Surgical/Family History     Past Medical History:   Diagnosis Date    Anxiety     Asthma     Depression     Gestational diabetes     Preeclampsia     Scabies     Substance abuse         Patient Active Problem List   Diagnosis Code    Paranoia F22    Scabies B86    High-risk pregnancy O09.90    PTSD (post-traumatic stress disorder) F43.10    History of cesarean section complicating pregnancy O34.219    H/O fetal anomaly in prior pregnancy, currently pregnant O09.299    History of gestational diabetes in prior pregnancy, currently pregnant O09.299, Z86.32    Hx of preeclampsia, prior pregnancy, currently pregnant O09.299    Substance use disorder F19.90    Homeless Z59.00    Asthma during pregnancy O99.519, J45.909    Trichomonas infection A59.9    Staph aureus infection A49.01    Rash of hands R21    Suicidal ideation R45.851    Rubella non-immune status, antepartum O09.899, Z28.39    GBS (group B Streptococcus carrier), +RV culture, currently pregnant O99.820    History of cesarean delivery Z98.891    Complex care coordination Z71.89            Past Surgical History:   Procedure Laterality Date    CESAREAN SECTION, LOW TRANSVERSE      Left leg surgery      TONSILLECTOMY AND ADENOIDECTOMY            Social History     Tobacco Use    Smoking status: Every Day     Packs/day: .5     Types: Cigarettes     Start date: 2002    Smokeless tobacco: Former     Types: Snuff, Chew    Substance Use Topics    Alcohol use: Yes    Drug use: Yes     Types: Cocaine, Methamphetamines, IV, Heroin, Marijuana             Review of Systems    Physical Exam     Triage Vitals  Triage Start: Start, (09/20/23 2004)  First Recorded BP: 118/63, Resp: 18, Temp: 36.5 C (97.7 F), Temp src: Infrared Oxygen Therapy SpO2: 97 %, Oximetry Source: Rt Hand, O2 Device: None (Room air), Heart Rate: 68, (09/20/23 2004)  .  First Pain Reported  0-10 Scale: 10, Pain Location/Orientation: Hand Left;Hand Right, (09/20/23 2005)       Physical Exam  Vitals and nursing note reviewed.   Constitutional:       Appearance: Normal appearance. She is normal weight.   HENT:      Head: Normocephalic and atraumatic.      Right  Ear: External ear normal.      Left Ear: External ear normal.      Nose: Nose normal.      Mouth/Throat:      Mouth: Mucous membranes are moist.   Eyes:      Extraocular Movements: Extraocular movements intact.      Conjunctiva/sclera: Conjunctivae normal.   Cardiovascular:      Rate and Rhythm: Normal rate.   Pulmonary:      Effort: Pulmonary effort is normal.   Abdominal:      General: Abdomen is flat.   Musculoskeletal:         General: Normal range of motion.      Cervical back: Normal range of motion.   Skin:     General: Skin is warm and dry.      Comments: There is dried/cracked skin overlying the dorsum of the hands bilaterally.   Neurological:      General: No focal deficit present.      Mental Status: She is alert.         Medical Decision Making   Patient seen by me on:  09/20/2023    Assessment:  32 year old female PMH of anxiety, depression, asthma, substance abuse, PTSD comes ED with a rash to her right and left hand x 2 days.  Patient admits to frequent handwashing throughout the day.    Differential diagnosis:  Contact dermatitis, dermatitis, dyshidrotic eczema    Plan:  Orders Placed This Encounter      Chlamydia NAAT (PCR)      N. gonorrhoeae NAAT (PCR)      HIV-1/2  Antigen/Antibody Screen with  Confirmation      ED/UC REFERRAL TO DERMATOLOGY    Medications  betamethasone dipropionate (DIPROSONE) 0.05 % cream ( Topical Given 09/21/23 0051)      ED Course and Disposition:  Betamethasone cream applied in ED.  Triamcinolone cream sent to pharmacy.  Referral to dermatology placed.    Patient discharged home with return precautions.           Madalyn Rob, PA            Madalyn Rob, [place]  09/21/23 [phone]

## 2023-09-22 ENCOUNTER — Other Ambulatory Visit: Payer: Self-pay

## 2023-09-23 ENCOUNTER — Other Ambulatory Visit: Payer: Self-pay

## 2023-09-24 ENCOUNTER — Other Ambulatory Visit: Payer: Self-pay

## 2023-09-24 ENCOUNTER — Emergency Department
Admission: EM | Admit: 2023-09-24 | Discharge: 2023-09-24 | Disposition: A | Discharge status: Home / Self Care | Payer: Medicaid Other | Source: Ambulatory Visit | Attending: Emergency Medicine | Admitting: Emergency Medicine

## 2023-09-24 DIAGNOSIS — Z0441 Encounter for examination and observation following alleged adult rape: Secondary | ICD-10-CM

## 2023-09-24 DIAGNOSIS — R0789 Other chest pain: Secondary | ICD-10-CM

## 2023-09-24 DIAGNOSIS — Z114 Encounter for screening for human immunodeficiency virus [HIV]: Secondary | ICD-10-CM | POA: Insufficient documentation

## 2023-09-24 DIAGNOSIS — Y9289 Other specified places as the place of occurrence of the external cause: Secondary | ICD-10-CM | POA: Insufficient documentation

## 2023-09-24 DIAGNOSIS — Y9389 Activity, other specified: Secondary | ICD-10-CM | POA: Insufficient documentation

## 2023-09-24 DIAGNOSIS — Z113 Encounter for screening for infections with a predominantly sexual mode of transmission: Secondary | ICD-10-CM | POA: Insufficient documentation

## 2023-09-24 DIAGNOSIS — Z118 Encounter for screening for other infectious and parasitic diseases: Secondary | ICD-10-CM | POA: Insufficient documentation

## 2023-09-24 DIAGNOSIS — X58XXXA Exposure to other specified factors, initial encounter: Secondary | ICD-10-CM | POA: Insufficient documentation

## 2023-09-24 DIAGNOSIS — Z036 Encounter for observation for suspected toxic effect from ingested substance ruled out: Secondary | ICD-10-CM | POA: Insufficient documentation

## 2023-09-24 DIAGNOSIS — F17210 Nicotine dependence, cigarettes, uncomplicated: Secondary | ICD-10-CM | POA: Insufficient documentation

## 2023-09-24 DIAGNOSIS — Y998 Other external cause status: Secondary | ICD-10-CM | POA: Insufficient documentation

## 2023-09-24 DIAGNOSIS — Z1159 Encounter for screening for other viral diseases: Secondary | ICD-10-CM | POA: Insufficient documentation

## 2023-09-24 DIAGNOSIS — T7621XA Adult sexual abuse, suspected, initial encounter: Secondary | ICD-10-CM | POA: Insufficient documentation

## 2023-09-24 DIAGNOSIS — T402X1A Poisoning by other opioids, accidental (unintentional), initial encounter: Secondary | ICD-10-CM | POA: Insufficient documentation

## 2023-09-24 MED ORDER — NALOXONE HCL FOR PATIENT DISCHARGE (FREE FROM NYS) 4 MG/0.1ML NA LIQU KIT *I*
1.0000 | Freq: Once | NASAL | Status: AC
Start: 2023-09-24 — End: 2023-09-24
  Administered 2023-09-24: 1 via NASAL
  Filled 2023-09-24: qty 1

## 2023-09-24 MED ORDER — HALOPERIDOL LACTATE 5 MG/ML IJ SOLN *I*
5.0000 mg | Freq: Once | INTRAMUSCULAR | Status: DC
Start: 2023-09-24 — End: 2023-09-24
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
## 2023-09-24 NOTE — ED Notes (Signed)
Sane contacted @ 2340 awaiting room placement.

## 2023-09-24 NOTE — Discharge Instructions (Addendum)
[facility] ED/Hospital Discharge Instructions related to Opioid Use  You were seen in the ED/hospital for complications related to opioid use. This may be a good time for you to think about ways to stay safer, healthier, and out of the hospital, whether you   would like to stop, cut back, or continue to use substances.   NOTE:  If you have trouble breathing, have a fever or chills, signs of skin infections or abscesses, or your symptoms otherwise worsen, please call 911 or go back to the emergency department. Otherwise, please follow up with your regular primary care doctor and/or any of the resources below for assistance. If you don't have a primary care doctor, please call [phone] to schedule.   To schedule an evaluation for treatment including starting or continuing medications to help with your cravings and/or symptoms of withdrawal the following programs are available:  Outpatient Treatment:   [facility] -   Evaluations available for methadone and buprenorphine    Call for phone screen:   [phone] (option 2)  [address], [address], [place], [place] [address]  [facility] (scan QR code)- virtual emergency appointments available for same day access to buprenorphine &   referral to community appointments        Detoxification/Inpatient Treatment:    [facility] (Walk-in Evaluations 24/7)   [address], [place], [place] [address]   [phone]   [facility] (Call for Intake & Phone Screen)  [address] [place],  [address]  [phone]  [facility] (Call for Intake & Phone screen)  [address] [place],  [address]  M-F 8-5pm- Admissions Coordinator- Ashlei Hazzard: [phone]   After hours:  [phone]  For assistance with referrals and evaluation: and immediate support from a peer or counselor we recommend you visit:  [facility] (Walk-in Evaluations 24/7)  [address], [place],   [address]  [phone]  Additional Treatment & Referral Resources  National Council On Alcoholism and Drug Dependence-   [place] (scan QR code)      Peer Resources: Many treatment programs have peer support. Peer support consists of connecting with people who have been where you have been.    If you would prefer to reach out to a peer outside of a treatment program that has been educated to provide resources to help we recommend you contact:   [facility] Peer Support [phone]  Email:  recovnavigator@[facility].org  [facility] Peer Support [phone]   12 steps programs are also available which include:   Narcotics Anonymous 24-hour hotline 585-235-7889     Family support: If you are a family member or friend of someone who uses alcohol or drugs, there is support for you also.  Please reach out to:  [facility] Friend Support [phone]  Email:  recovnavigator@[facility].org  [place]- https://www.nar-anon.org/    [place] [facility] [place]: http://www.[place].org/   ([phone]      Safety plan: If you continue to use, it is a good idea to build a safety plan to limit risk for infection, overdose, and death. Please consider using some or all of the following:  Avoid using alone - it may help your chances of surviving an accidental overdose  Carry Naloxone (also known as Narcan), which can be sprayed into your nose to reverse the effects of an opioid overdose.  For ways to access naloxone and to find   trainings on how to use Naloxone, go to https://www.[place].gov/opioids/narcan.php  or call (585)210-4146  Use new, sterile injection supplies and DO NOT share needles - This will reduce your risk of contracting an infectious disease like HIV, hepatitis C, and others. Clean supplies are available at:   [facility] 585-454-5556                             [address] [place], [place] [address]  Monday, Wednesday, Friday 1-4, Tuesday 9-4 and Thursday  1-6  The use of fentanyl and xylazine test strips increases awareness if fentanyl or xylazine is in the drug you are using. Fentanyl and xylazine   can increase your risk of overdose. These FREE test strips can be mailed to you via [facility] (scan QR code to request)        [place] [facility] When an overdose happens in [place], an [facility] team members can offer compassion, support, resources and guidance to overdose survivors and their loved ones. They can also provide group Narcan training and will setup community outreach and education. For more information, check out the County website https://www.[place].gov/health-impact  or call (585) [phone].
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
## 2023-09-24 NOTE — ED Notes (Signed)
Pt discharge instructions reviewed. Pt comfortable with discharge planning and verbalizes understanding of discharge instruction. Pt ambulatory without assistance and steady gait. Tolerated PO. Pt will follow up with PCP. Pt getting medicaid cab home. Pt belongings with pt. Pt safe to discharge at this time.

## 2023-09-24 NOTE — ED Notes (Signed)
Pt endorsing to writer " that she would like her vaginal hairs scraped for evidence" also stating  " [facility] told me I may be artificially inseminated, they are talking about x and y chromosomes".
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
## 2023-09-25 ENCOUNTER — Emergency Department

## 2023-09-25 ENCOUNTER — Emergency Department
Admission: EM | Admit: 2023-09-25 | Discharge: 2023-09-25 | Disposition: A | Discharge status: Home / Self Care | Source: Ambulatory Visit | Attending: Emergency Medicine | Admitting: Emergency Medicine

## 2023-09-25 DIAGNOSIS — T7421XA Adult sexual abuse, confirmed, initial encounter: Secondary | ICD-10-CM

## 2023-09-25 DIAGNOSIS — Z0441 Encounter for examination and observation following alleged adult rape: Secondary | ICD-10-CM

## 2023-09-25 DIAGNOSIS — R079 Chest pain, unspecified: Secondary | ICD-10-CM

## 2023-09-25 LAB — POCT URINE PREGNANCY
Lot #: 816504
Preg Test,UR POC: NEGATIVE

## 2023-09-25 LAB — POCT URINALYSIS DIPSTICK
Blood,UA POCT: NEGATIVE
Glucose,UA POCT: NORMAL mg/dL
Ketones,UA POCT: NEGATIVE mg/dL
Leuk Esterase,UA POCT: NEGATIVE
Lot #: 1075554903
Nitrite,UA POCT: NEGATIVE
PH,UA POCT: 5 (ref 5–8)

## 2023-09-25 LAB — SYPHILIS SCREEN
Syphilis Screen: NEGATIVE
Syphilis Status: NONREACTIVE

## 2023-09-25 LAB — HEPATITIS C VIRUS ANTIBODY WITH REFLEX TO HEPATITIS C QUANTITATIVE: Hep C Ab: NEGATIVE

## 2023-09-25 LAB — TRICHOMONAS NAAT: Trichomonas NAAT: NEGATIVE

## 2023-09-25 LAB — CHLAMYDIA NAAT (PCR): Chlamydia NAAT (PCR): NEGATIVE

## 2023-09-25 LAB — CBC AND DIFFERENTIAL
Baso # K/uL: 0 10*3/uL (ref 0.0–0.2)
Eos # K/uL: 0.2 10*3/uL (ref 0.0–0.5)
Hematocrit: 37 % (ref 34–49)
Hemoglobin: 12.2 g/dL (ref 11.2–16.0)
IMM Granulocytes #: 0 10*3/uL (ref 0.0–0.0)
IMM Granulocytes: 0.2 %
Lymph # K/uL: 2.1 10*3/uL (ref 1.0–5.0)
MCV: 95 fL (ref 75–100)
Mono # K/uL: 0.5 10*3/uL (ref 0.1–1.0)
Neut # K/uL: 1.4 10*3/uL — ABNORMAL LOW (ref 1.5–6.5)
Nucl RBC # K/uL: 0 10*3/uL (ref 0.0–0.0)
Nucl RBC %: 0 /100{WBCs} (ref 0.0–0.2)
Platelets: 208 10*3/uL (ref 150–450)
RBC: 3.8 MIL/uL — ABNORMAL LOW (ref 4.0–5.5)
RDW: 12.8 % (ref 0.0–15.0)
Seg Neut %: 33.8 %
WBC: 4.2 10*3/uL (ref 3.5–11.0)

## 2023-09-25 LAB — HEPATITIS B SURFACE ANTIBODY
HBV S Ab Quant: >1000 m[IU]/mL
HBV S Ab: POSITIVE

## 2023-09-25 LAB — DRUGS OF ABUSE SCRN,URINE
Amphetamine,UR: NEGATIVE
Barbiturate,UR: NEGATIVE
Benzodiazepinen,UR: NEGATIVE
Cocaine/Metab,UR: POSITIVE — AB
Opiates,UR: NEGATIVE
PCP,UR: NEGATIVE
THC Metabolite,UR: NEGATIVE
Tricyclics,UR: NEGATIVE

## 2023-09-25 LAB — N. GONORRHOEAE NAAT (PCR): N. gonorrhoeae NAAT (PCR): NEGATIVE

## 2023-09-25 LAB — COMPREHENSIVE METABOLIC PANEL
ALT: 10 U/L (ref 0–35)
AST: 17 U/L (ref 0–35)
Albumin: 4 g/dL (ref 3.5–5.2)
Alk Phos: 65 U/L (ref 35–105)
Anion Gap: 13 (ref 7–16)
Bilirubin,Total: 1 mg/dL (ref 0.0–1.2)
CO2: 23 mmol/L (ref 20–28)
Calcium: 8.8 mg/dL (ref 8.8–10.2)
Chloride: 105 mmol/L (ref 96–108)
Creatinine: 0.89 mg/dL (ref 0.51–0.95)
Glucose: 93 mg/dL (ref 60–99)
Lab: 21 mg/dL — ABNORMAL HIGH (ref 6–20)
Potassium: 3.6 mmol/L (ref 3.3–5.1)
Sodium: 141 mmol/L (ref 133–145)
Total Protein: 6.5 g/dL (ref 6.3–7.7)
eGFR BY CREAT: 88 *

## 2023-09-25 LAB — HEPATITIS B SURFACE ANTIGEN: HBV S Ag: NEGATIVE

## 2023-09-25 LAB — HIV-1/2  ANTIGEN/ANTIBODY SCREEN WITH CONFIRMATION: HIV 1&2 ANTIGEN/ANTIBODY: NONREACTIVE

## 2023-09-25 LAB — PREGNANCY TEST, SERUM: Preg,Serum: NEGATIVE

## 2023-09-25 MED ORDER — AZITHROMYCIN 250 MG PO TABS *I*
1000.0000 mg | ORAL_TABLET | Freq: Once | ORAL | Status: AC
Start: 2023-09-25 — End: 2023-09-25
  Administered 2023-09-25: 1000 mg via ORAL
  Filled 2023-09-25: qty 4

## 2023-09-25 MED ORDER — (HIV PEP KIT) DOLUTEGRAVIR SODIUM 50 MG PO TABS *I*
50.0000 mg | ORAL_TABLET | Freq: Every day | ORAL | Status: DC
Start: 2023-09-25 — End: 2023-09-25
  Administered 2023-09-25: 50 mg via ORAL
  Filled 2023-09-25: qty 1

## 2023-09-25 MED ORDER — (HIV PEP KIT) EMTRICITABINE-TENOFOVIR 200-300 MG PO TABS *I*
1.0000 | ORAL_TABLET | Freq: Every day | ORAL | Status: DC
Start: 2023-09-25 — End: 2023-09-25
  Administered 2023-09-25: 1 via ORAL
  Filled 2023-09-25: qty 1

## 2023-09-25 MED ORDER — CEFTRIAXONE SODIUM 500 MG IJ SOLR *I*
500.0000 mg | Freq: Once | INTRAMUSCULAR | Status: AC
Start: 2023-09-25 — End: 2023-09-25
  Administered 2023-09-25: 500 mg via INTRAMUSCULAR
  Filled 2023-09-25: qty 5

## 2023-09-25 MED ORDER — ONDANSETRON 4 MG PO TBDP *I*
4.0000 mg | ORAL_TABLET | Freq: Once | ORAL | Status: AC
Start: 2023-09-25 — End: 2023-09-25
  Administered 2023-09-25: 4 mg via ORAL
  Filled 2023-09-25: qty 1

## 2023-09-25 MED ORDER — METRONIDAZOLE 250 MG PO TABS *I*
2000.0000 mg | ORAL_TABLET | Freq: Once | ORAL | Status: AC
Start: 2023-09-25 — End: 2023-09-25
  Administered 2023-09-25: 2000 mg via ORAL
  Filled 2023-09-25: qty 8
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
## 2023-09-25 NOTE — ED Notes (Signed)
SAFE provider in room with patient at this time.

## 2023-09-26 ENCOUNTER — Other Ambulatory Visit: Payer: Self-pay

## 2023-09-26 LAB — FENTANYL,UR (SMH, HH, FFT): Fentanyl, UR: POSITIVE ng/mL

## 2023-09-27 ENCOUNTER — Emergency Department
Admission: EM | Admit: 2023-09-27 | Discharge: 2023-09-27 | Disposition: A | Discharge status: Home / Self Care | Payer: PRIVATE HEALTH INSURANCE | Source: Ambulatory Visit | Attending: Emergency Medicine | Admitting: Emergency Medicine

## 2023-09-27 ENCOUNTER — Other Ambulatory Visit: Payer: Self-pay

## 2023-09-27 DIAGNOSIS — Y998 Other external cause status: Secondary | ICD-10-CM | POA: Insufficient documentation

## 2023-09-27 DIAGNOSIS — Y9389 Activity, other specified: Secondary | ICD-10-CM | POA: Insufficient documentation

## 2023-09-27 DIAGNOSIS — Y92009 Unspecified place in unspecified non-institutional (private) residence as the place of occurrence of the external cause: Secondary | ICD-10-CM | POA: Insufficient documentation

## 2023-09-27 DIAGNOSIS — X58XXXA Exposure to other specified factors, initial encounter: Secondary | ICD-10-CM | POA: Insufficient documentation

## 2023-09-27 DIAGNOSIS — T7621XA Adult sexual abuse, suspected, initial encounter: Secondary | ICD-10-CM | POA: Insufficient documentation

## 2023-09-27 DIAGNOSIS — T7421XA Adult sexual abuse, confirmed, initial encounter: Secondary | ICD-10-CM

## 2023-09-27 LAB — CBC AND DIFFERENTIAL
Baso # K/uL: 0 10*3/uL (ref 0.0–0.2)
Eos # K/uL: 0.2 10*3/uL (ref 0.0–0.5)
Hematocrit: 41 % (ref 34–49)
Hemoglobin: 13.2 g/dL (ref 11.2–16.0)
IMM Granulocytes #: 0 10*3/uL (ref 0.0–0.0)
IMM Granulocytes: 0.2 %
Lymph # K/uL: 2.4 10*3/uL (ref 1.0–5.0)
MCV: 98 fL (ref 75–100)
Mono # K/uL: 0.5 10*3/uL (ref 0.1–1.0)
Neut # K/uL: 3.3 10*3/uL (ref 1.5–6.5)
Nucl RBC # K/uL: 0 10*3/uL (ref 0.0–0.0)
Nucl RBC %: 0 /100{WBCs} (ref 0.0–0.2)
Platelets: 217 10*3/uL (ref 150–450)
RBC: 4.2 MIL/uL (ref 4.0–5.5)
RDW: 12.3 % (ref 0.0–15.0)
Seg Neut %: 50.8 %
WBC: 6.4 10*3/uL (ref 3.5–11.0)

## 2023-09-27 LAB — COMPREHENSIVE METABOLIC PANEL
ALT: 11 U/L (ref 0–35)
AST: 22 U/L (ref 0–35)
Albumin: 4.5 g/dL (ref 3.5–5.2)
Alk Phos: 70 U/L (ref 35–105)
Anion Gap: 10 (ref 7–16)
Bilirubin,Total: 0.7 mg/dL (ref 0.0–1.2)
CO2: 24 mmol/L (ref 20–28)
Calcium: 9.4 mg/dL (ref 8.8–10.2)
Chloride: 104 mmol/L (ref 96–108)
Creatinine: 0.85 mg/dL (ref 0.51–0.95)
Glucose: 86 mg/dL (ref 60–99)
Lab: 17 mg/dL (ref 6–20)
Potassium: 4 mmol/L (ref 3.3–5.1)
Sodium: 138 mmol/L (ref 133–145)
Total Protein: 7.4 g/dL (ref 6.3–7.7)
eGFR BY CREAT: 93 *

## 2023-09-27 LAB — BHCG, QUANT PREGNANCY: BHCG, QUANT PREGNANCY: <1 m[IU]/mL (ref 0–1)

## 2023-09-27 MED ORDER — AZITHROMYCIN 250 MG PO TABS *I*
1000.0000 mg | ORAL_TABLET | Freq: Once | ORAL | Status: AC
Start: 2023-09-27 — End: 2023-09-27
  Administered 2023-09-27: 1000 mg via ORAL
  Filled 2023-09-27: qty 4

## 2023-09-27 MED ORDER — CEFTRIAXONE SODIUM 500 MG IJ SOLR *I*
500.0000 mg | Freq: Once | INTRAMUSCULAR | Status: AC
Start: 2023-09-27 — End: 2023-09-27
  Administered 2023-09-27: 500 mg via INTRAMUSCULAR
  Filled 2023-09-27: qty 5

## 2023-09-27 MED ORDER — METRONIDAZOLE 250 MG PO TABS *I*
2000.0000 mg | ORAL_TABLET | Freq: Once | ORAL | Status: AC
Start: 2023-09-27 — End: 2023-09-27
  Administered 2023-09-27: 2000 mg via ORAL
  Filled 2023-09-27: qty 8
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
## 2023-09-27 NOTE — ED Notes (Signed)
Patient left without meds and labs.

## 2023-09-27 NOTE — ED Notes (Signed)
Patient also refused a urine sample.
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
## 2023-09-27 NOTE — Discharge Instructions (Addendum)
Contact Infectious disease for a follow up appointment. Continue taking your previously prescribed HIV post-exposure prophylaxis.    You received multiple antibiotics today to prevent sexually transmitted diseases.     If you need help with housing you can contact the DSS emergency housing line [phone]

## 2023-09-28 LAB — HEPATITIS C RNA, QUANTITATIVE, PCR
HCV PCR log: NOT DETECTED [IU]/mL
HCV PCR: NOT DETECTED [IU]/mL

## 2023-09-28 LAB — SYPHILIS SCREEN
Syphilis Screen: NEGATIVE
Syphilis Status: NONREACTIVE

## 2023-09-28 LAB — HEPATITIS A,B,C,PANEL
HBV Core Ab: NEGATIVE
HBV S Ab Quant: >1000 m[IU]/mL
HBV S Ab: POSITIVE
HBV S Ag: NEGATIVE
Hep C Ab: NEGATIVE
Hepatitis A IGG: POSITIVE

## 2023-09-28 LAB — HIV-1/2  ANTIGEN/ANTIBODY SCREEN WITH CONFIRMATION: HIV 1&2 ANTIGEN/ANTIBODY: NONREACTIVE

## 2023-09-28 LAB — TRICHOMONAS NAAT: Trichomonas NAAT: NEGATIVE

## 2023-09-28 LAB — N. GONORRHOEAE NAAT (PCR): N. gonorrhoeae NAAT (PCR): NEGATIVE

## 2023-09-28 LAB — CHLAMYDIA NAAT (PCR): Chlamydia NAAT (PCR): NEGATIVE

## 2023-09-28 LAB — VAGINITIS SCREEN: DNA PROBE: Vaginitis Screen:DNA Probe: 0
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
## 2023-09-30 NOTE — Progress Notes (Addendum)
Social Work Progress Note    SW consult requested. Writer is familiar with patient due to frequent ED visits.      Patient is in the waiting room stating she is going to be homeless for 2 weeks. Writer met with patient in waiting room. Patient requested to move to a more private area to discuss. Patient showed Writer a paper from her rep-payee showing that she has paid for a new apartment on [address] [place] and states that her move in date is on 10/15/23. She reports since she paid rent and deposit for her new apartment she was unable to pay rent for her current rooming house. She reports they kicked her out of [employer] and has no where to go.     Writer made aware to patient again that [facility] Hours Emergency [facility] will only place patient if she has proof of spending all of her income. Patient reports she will tell them she does not have any. Writer also contacted [facility] prior to seeing patient and confirmed with shelter worker that they are full and are not accepting people for the night.     Writer attempted to contact [facility] but call went to voicemail immediately (x2). Writer left a Engineer, technical sales. Patient was brought back into waiting room. She requested for a blanket and heat packs which Writer provided.     Glynis Smiles, MSW  Emergency Department Social Worker  SW ED cell: [phone]  Pager: [phone]  10:20 PM 09/30/23    Writer received a phone call back from [facility] and provided SW cell to patient in waiting room to complete phone screen. DHS worker reports patient must have a eviction and/or was "put out" with proof of no income. Patient reports she does not have proof. DHS denied shelter placement and recommended for patient to call for hospitality beds at local shelters.     Clinical research associate contacted the following shelters for hospitality beds: [facility] [place], [facility] [place], [place]. All shelters declined availability for hospitality beds. Writer will  provide a list of motels to patient otherwise, there are no shelters for the current night to accept her.     Glynis Smiles, MSW  Emergency Department Social Worker  SW ED cell: [phone]  Pager: [phone]  10:46 PM 09/30/23    Writer notified ED attending and triage. Writer will provide list of motels for her to contact. Patient cannot stay in waiting room until the AM. Writer met with her in waiting room and informed her the information above.     Glynis Smiles, MSW  Emergency Department Social Worker  SW ED cell: [phone]  Pager: [phone]  10:55 PM 09/30/23

## 2023-10-01 ENCOUNTER — Emergency Department
Admission: EM | Admit: 2023-10-01 | Discharge: 2023-10-01 | Disposition: A | Discharge status: Home / Self Care | Payer: Medicaid Other | Source: Ambulatory Visit | Attending: Emergency Medicine | Admitting: Emergency Medicine

## 2023-10-01 ENCOUNTER — Other Ambulatory Visit: Payer: Self-pay

## 2023-10-01 ENCOUNTER — Encounter: Payer: Self-pay | Admitting: Emergency Medicine

## 2023-10-01 DIAGNOSIS — Z765 Malingerer [conscious simulation]: Secondary | ICD-10-CM | POA: Insufficient documentation

## 2023-10-01 DIAGNOSIS — R519 Headache, unspecified: Secondary | ICD-10-CM | POA: Insufficient documentation

## 2023-10-01 DIAGNOSIS — F17210 Nicotine dependence, cigarettes, uncomplicated: Secondary | ICD-10-CM | POA: Insufficient documentation

## 2023-10-01 MED ORDER — ACETAMINOPHEN 325 MG PO TABS *I*
650.0000 mg | ORAL_TABLET | Freq: Once | ORAL | Status: AC
Start: 2023-10-01 — End: 2023-10-01
  Administered 2023-10-01: 650 mg via ORAL
  Filled 2023-10-01: qty 2
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
## 2023-10-01 NOTE — ED Triage Notes (Signed)
Patient to ED with headache x 30 mins. Endorses dizziness.    Prehospital medications given: No

## 2023-10-01 NOTE — ED Notes (Signed)
Patient provided socks, warm blankets and ginger ale.

## 2023-10-01 NOTE — Discharge Instructions (Signed)
You were given Tylenol for your severe headache.  You were given socks for your cold feet.  Please bring all your paperwork to DSS as instructed by our social worker earlier tonight.
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
## 2023-10-16 ENCOUNTER — Ambulatory Visit: Payer: Medicaid Other

## 2023-10-19 ENCOUNTER — Telehealth: Payer: Self-pay

## 2023-10-19 NOTE — Telephone Encounter (Signed)
Writer attempted to call patient to inform her she missed her NPV appt on 10.18.2024. MyChart is not active

## 2023-11-05 ENCOUNTER — Other Ambulatory Visit: Payer: Self-pay

## 2023-11-05 DIAGNOSIS — K1370 Unspecified lesions of oral mucosa: Secondary | ICD-10-CM | POA: Insufficient documentation

## 2023-11-05 DIAGNOSIS — Y9389 Activity, other specified: Secondary | ICD-10-CM | POA: Insufficient documentation

## 2023-11-05 DIAGNOSIS — T7621XA Adult sexual abuse, suspected, initial encounter: Secondary | ICD-10-CM | POA: Insufficient documentation

## 2023-11-05 DIAGNOSIS — Z043 Encounter for examination and observation following other accident: Secondary | ICD-10-CM

## 2023-11-05 DIAGNOSIS — X58XXXA Exposure to other specified factors, initial encounter: Secondary | ICD-10-CM | POA: Insufficient documentation

## 2023-11-05 DIAGNOSIS — F17210 Nicotine dependence, cigarettes, uncomplicated: Secondary | ICD-10-CM | POA: Insufficient documentation

## 2023-11-05 DIAGNOSIS — Y92039 Unspecified place in apartment as the place of occurrence of the external cause: Secondary | ICD-10-CM | POA: Insufficient documentation

## 2023-11-05 DIAGNOSIS — Y998 Other external cause status: Secondary | ICD-10-CM | POA: Insufficient documentation
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
## 2023-11-05 NOTE — ED Notes (Signed)
Patient no longer wants to be seen for sane exam . Wants to be seen for sore on lip , states " a doctor will look at it and tell me what to do "

## 2023-11-06 ENCOUNTER — Emergency Department
Admission: EM | Admit: 2023-11-06 | Discharge: 2023-11-07 | Disposition: A | Discharge status: Home / Self Care | Payer: Medicaid Other | Source: Ambulatory Visit | Attending: Emergency Medicine | Admitting: Emergency Medicine

## 2023-11-06 ENCOUNTER — Other Ambulatory Visit: Payer: Self-pay

## 2023-11-06 ENCOUNTER — Emergency Department
Admission: EM | Admit: 2023-11-06 | Discharge: 2023-11-06 | Disposition: A | Discharge status: Home / Self Care | Source: Ambulatory Visit | Attending: Student in an Organized Health Care Education/Training Program | Admitting: Student in an Organized Health Care Education/Training Program

## 2023-11-06 DIAGNOSIS — Y998 Other external cause status: Secondary | ICD-10-CM | POA: Insufficient documentation

## 2023-11-06 DIAGNOSIS — F17210 Nicotine dependence, cigarettes, uncomplicated: Secondary | ICD-10-CM | POA: Insufficient documentation

## 2023-11-06 DIAGNOSIS — Y9389 Activity, other specified: Secondary | ICD-10-CM | POA: Insufficient documentation

## 2023-11-06 DIAGNOSIS — Y92039 Unspecified place in apartment as the place of occurrence of the external cause: Secondary | ICD-10-CM | POA: Insufficient documentation

## 2023-11-06 DIAGNOSIS — T7421XA Adult sexual abuse, confirmed, initial encounter: Secondary | ICD-10-CM

## 2023-11-06 DIAGNOSIS — T7621XA Adult sexual abuse, suspected, initial encounter: Secondary | ICD-10-CM | POA: Insufficient documentation

## 2023-11-06 DIAGNOSIS — B009 Herpesviral infection, unspecified: Secondary | ICD-10-CM | POA: Insufficient documentation

## 2023-11-06 MED ORDER — DEXTROSE 5 % FLUSH FOR PUMPS *I*
0.0000 mL/h | INTRAVENOUS | Status: DC | PRN
Start: 2023-11-06 — End: 2023-11-07

## 2023-11-06 MED ORDER — AZITHROMYCIN 250 MG PO TABS *I*
1000.0000 mg | ORAL_TABLET | Freq: Once | ORAL | Status: AC
Start: 2023-11-06 — End: 2023-11-06
  Administered 2023-11-06: 1000 mg via ORAL
  Filled 2023-11-06: qty 4

## 2023-11-06 MED ORDER — ONDANSETRON 4 MG PO TBDP *I*
4.0000 mg | ORAL_TABLET | Freq: Once | ORAL | Status: AC
Start: 2023-11-06 — End: 2023-11-06
  Administered 2023-11-06: 4 mg via ORAL
  Filled 2023-11-06: qty 1

## 2023-11-06 MED ORDER — METRONIDAZOLE 250 MG PO TABS *I*
2000.0000 mg | ORAL_TABLET | Freq: Once | ORAL | Status: AC
Start: 2023-11-06 — End: 2023-11-06
  Administered 2023-11-06: 2000 mg via ORAL
  Filled 2023-11-06: qty 8

## 2023-11-06 MED ORDER — SODIUM CHLORIDE 0.9 % FLUSH FOR PUMPS *I*
0.0000 mL/h | INTRAVENOUS | Status: DC | PRN
Start: 2023-11-06 — End: 2023-11-07

## 2023-11-06 MED ORDER — (HIV PEP KIT) EMTRICITABINE-TENOFOVIR 200-300 MG PO TABS *I*
1.0000 | ORAL_TABLET | Freq: Every day | ORAL | Status: DC
Start: 2023-11-06 — End: 2023-11-07
  Administered 2023-11-06: 1 via ORAL
  Filled 2023-11-06: qty 1

## 2023-11-06 MED ORDER — CEFTRIAXONE SODIUM 500 MG IJ SOLR *I*
500.0000 mg | Freq: Once | INTRAMUSCULAR | Status: AC
Start: 2023-11-06 — End: 2023-11-06
  Administered 2023-11-06: 500 mg via INTRAMUSCULAR
  Filled 2023-11-06: qty 5

## 2023-11-06 MED ORDER — (HIV PEP KIT) DOLUTEGRAVIR SODIUM 50 MG PO TABS *I*
50.0000 mg | ORAL_TABLET | Freq: Every day | ORAL | Status: DC
Start: 2023-11-06 — End: 2023-11-13
  Administered 2023-11-06: 50 mg via ORAL
  Filled 2023-11-06: qty 1

## 2023-11-06 MED ORDER — VALACYCLOVIR HCL 500 MG PO TABS *I*
1000.0000 mg | ORAL_TABLET | Freq: Once | ORAL | Status: AC
Start: 2023-11-06 — End: 2023-11-06
  Administered 2023-11-06: 1000 mg via ORAL
  Filled 2023-11-06: qty 2
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
## 2023-11-06 NOTE — ED Notes (Signed)
LPN data collection completed, not to replace RN assessment.
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
## 2023-11-07 ENCOUNTER — Encounter: Payer: Self-pay | Admitting: Emergency Medicine

## 2023-11-07 ENCOUNTER — Other Ambulatory Visit: Payer: Self-pay

## 2023-11-07 LAB — SYPHILIS SCREEN
Syphilis Screen: NEGATIVE
Syphilis Status: NONREACTIVE

## 2023-11-07 LAB — HIV-1/2  ANTIGEN/ANTIBODY SCREEN WITH CONFIRMATION: HIV 1&2 ANTIGEN/ANTIBODY: NONREACTIVE

## 2023-11-07 LAB — HEPATITIS C VIRUS ANTIBODY WITH REFLEX TO HEPATITIS C QUANTITATIVE: Hep C Ab: NEGATIVE

## 2023-11-07 LAB — HEPATITIS B SURFACE ANTIBODY
HBV S Ab Quant: >1000 m[IU]/mL
HBV S Ab: POSITIVE

## 2023-11-07 LAB — HEPATITIS B SURFACE ANTIGEN: HBV S Ag: NEGATIVE

## 2023-11-07 LAB — PREGNANCY TEST, SERUM: Preg,Serum: NEGATIVE

## 2023-11-07 MED ORDER — ONDANSETRON 4 MG PO TBDP *I*
4.0000 mg | ORAL_TABLET | Freq: Three times a day (TID) | ORAL | 0 refills | Status: DC | PRN
Start: 2023-11-07 — End: 2023-11-07

## 2023-11-07 MED ORDER — VALACYCLOVIR HCL 1000 MG PO TABS *I*
1000.0000 mg | ORAL_TABLET | Freq: Every day | ORAL | 0 refills | Status: DC
Start: 2023-11-07 — End: 2023-11-07

## 2023-11-07 MED ORDER — ONDANSETRON 4 MG PO TBDP *I*
4.0000 mg | ORAL_TABLET | Freq: Three times a day (TID) | ORAL | 0 refills | Status: DC | PRN
Start: 2023-11-07 — End: 2024-07-08
  Filled 2023-11-07: qty 12, 4d supply, fill #0

## 2023-11-07 MED ORDER — VALACYCLOVIR HCL 1000 MG PO TABS *I*
1000.0000 mg | ORAL_TABLET | Freq: Every day | ORAL | 0 refills | Status: AC
Start: 2023-11-07 — End: 2023-11-12
  Filled 2023-11-07: qty 5, 5d supply, fill #0

## 2023-11-07 NOTE — Discharge Instructions (Signed)
You were given the antibiotics that you need here in the emergency department.  Take the HIV medication as prescribed.  For the lesions on your lip, take Valtrex once daily for 5 days.  Follow-up with your PCP.  If you do not have a PCP, there is a list attached of offices taking new patients.  Please call one of your choice.
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
## 2023-11-09 LAB — HEPATITIS C RNA, QUANTITATIVE, PCR
HCV PCR log: NOT DETECTED [IU]/mL
HCV PCR: NOT DETECTED [IU]/mL

## 2023-11-11 ENCOUNTER — Other Ambulatory Visit: Payer: Self-pay

## 2023-11-18 ENCOUNTER — Other Ambulatory Visit: Payer: Self-pay

## 2023-11-26 ENCOUNTER — Other Ambulatory Visit: Payer: Self-pay

## 2023-11-26 ENCOUNTER — Emergency Department
Admission: EM | Admit: 2023-11-26 | Discharge: 2023-11-26 | Disposition: A | Discharge status: Home / Self Care | Payer: Medicaid Other | Source: Ambulatory Visit | Attending: Emergency Medicine | Admitting: Emergency Medicine

## 2023-11-26 DIAGNOSIS — Y998 Other external cause status: Secondary | ICD-10-CM | POA: Insufficient documentation

## 2023-11-26 DIAGNOSIS — Y9289 Other specified places as the place of occurrence of the external cause: Secondary | ICD-10-CM | POA: Insufficient documentation

## 2023-11-26 DIAGNOSIS — Y9389 Activity, other specified: Secondary | ICD-10-CM | POA: Insufficient documentation

## 2023-11-26 DIAGNOSIS — X58XXXA Exposure to other specified factors, initial encounter: Secondary | ICD-10-CM | POA: Insufficient documentation

## 2023-11-26 DIAGNOSIS — Z113 Encounter for screening for infections with a predominantly sexual mode of transmission: Secondary | ICD-10-CM | POA: Insufficient documentation

## 2023-11-26 DIAGNOSIS — T7421XA Adult sexual abuse, confirmed, initial encounter: Secondary | ICD-10-CM

## 2023-11-26 DIAGNOSIS — T7621XA Adult sexual abuse, suspected, initial encounter: Secondary | ICD-10-CM | POA: Insufficient documentation

## 2023-11-26 LAB — COMPREHENSIVE METABOLIC PANEL
ALT: 11 U/L (ref 0–35)
AST: 22 U/L (ref 0–35)
Albumin: 4.2 g/dL (ref 3.5–5.2)
Alk Phos: 73 U/L (ref 35–105)
Anion Gap: 12 (ref 7–16)
Bilirubin,Total: 1.2 mg/dL (ref 0.0–1.2)
CO2: 22 mmol/L (ref 20–28)
Calcium: 9.4 mg/dL (ref 8.8–10.2)
Chloride: 106 mmol/L (ref 96–108)
Creatinine: 0.65 mg/dL (ref 0.51–0.95)
Glucose: 94 mg/dL (ref 60–99)
Lab: 12 mg/dL (ref 6–20)
Potassium: 3.7 mmol/L (ref 3.3–5.1)
Sodium: 140 mmol/L (ref 133–145)
Total Protein: 6.9 g/dL (ref 6.3–7.7)
eGFR BY CREAT: 120 *

## 2023-11-26 LAB — VAGINITIS SCREEN: DNA PROBE: Vaginitis Screen:DNA Probe: POSITIVE — AB

## 2023-11-26 LAB — PREGNANCY TEST, SERUM: Preg,Serum: NEGATIVE

## 2023-11-26 MED ORDER — ONDANSETRON 4 MG PO TBDP *I*
4.0000 mg | ORAL_TABLET | Freq: Once | ORAL | Status: AC
Start: 2023-11-26 — End: 2023-11-26
  Administered 2023-11-26: 4 mg via ORAL
  Filled 2023-11-26: qty 1

## 2023-11-26 MED ORDER — (HIV PEP KIT) DOLUTEGRAVIR SODIUM 50 MG PO TABS *I*
50.0000 mg | ORAL_TABLET | Freq: Every day | ORAL | Status: DC
Start: 2023-11-26 — End: 2023-11-26
  Administered 2023-11-26: 50 mg via ORAL
  Filled 2023-11-26: qty 1

## 2023-11-26 MED ORDER — METRONIDAZOLE 250 MG PO TABS *I*
2000.0000 mg | ORAL_TABLET | Freq: Once | ORAL | Status: AC
Start: 2023-11-26 — End: 2023-11-26
  Administered 2023-11-26: 2000 mg via ORAL
  Filled 2023-11-26: qty 8

## 2023-11-26 MED ORDER — AZITHROMYCIN 250 MG PO TABS *I*
1000.0000 mg | ORAL_TABLET | Freq: Once | ORAL | Status: AC
Start: 2023-11-26 — End: 2023-11-26
  Administered 2023-11-26: 1000 mg via ORAL
  Filled 2023-11-26: qty 4

## 2023-11-26 MED ORDER — (HIV PEP KIT) EMTRICITABINE-TENOFOVIR 200-300 MG PO TABS *I*
1.0000 | ORAL_TABLET | Freq: Every day | ORAL | Status: DC
Start: 2023-11-26 — End: 2023-11-26
  Administered 2023-11-26: 1 via ORAL
  Filled 2023-11-26: qty 1

## 2023-11-26 MED ORDER — CEFTRIAXONE IN LIDOCAINE 1% 350 MG/ML IM *I*
1000.0000 mg | Freq: Once | INTRAMUSCULAR | Status: AC
Start: 2023-11-26 — End: 2023-11-26
  Administered 2023-11-26: 1000 mg via INTRAMUSCULAR
  Filled 2023-11-26: qty 2.86
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
## 2023-11-27 LAB — HEPATITIS B SURFACE ANTIBODY
HBV S Ab Quant: 874.33 m[IU]/mL
HBV S Ab: POSITIVE

## 2023-11-27 LAB — HIV-1/2  ANTIGEN/ANTIBODY SCREEN WITH CONFIRMATION: HIV 1&2 ANTIGEN/ANTIBODY: NONREACTIVE

## 2023-11-27 LAB — SYPHILIS SCREEN
Syphilis Screen: NEGATIVE
Syphilis Status: NONREACTIVE

## 2023-11-27 LAB — HEPATITIS B SURFACE ANTIGEN: HBV S Ag: NEGATIVE

## 2023-11-27 LAB — HEPATITIS C VIRUS ANTIBODY WITH REFLEX TO HEPATITIS C QUANTITATIVE: Hep C Ab: NEGATIVE

## 2023-11-28 ENCOUNTER — Other Ambulatory Visit: Payer: Self-pay

## 2023-11-28 ENCOUNTER — Encounter: Payer: Self-pay | Admitting: Emergency Medicine

## 2023-11-28 ENCOUNTER — Emergency Department
Admission: EM | Admit: 2023-11-28 | Discharge: 2023-11-28 | Disposition: A | Discharge status: Home / Self Care | Payer: Medicaid Other | Source: Ambulatory Visit | Attending: Emergency Medicine | Admitting: Emergency Medicine

## 2023-11-28 DIAGNOSIS — N760 Acute vaginitis: Secondary | ICD-10-CM | POA: Insufficient documentation

## 2023-11-28 DIAGNOSIS — R0602 Shortness of breath: Secondary | ICD-10-CM | POA: Insufficient documentation

## 2023-11-28 LAB — TRICHOMONAS NAAT: Trichomonas NAAT: INVALID

## 2023-11-28 LAB — N. GONORRHOEAE NAAT (PCR): N. gonorrhoeae NAAT (PCR): INVALID

## 2023-11-28 LAB — CHLAMYDIA NAAT (PCR): Chlamydia NAAT (PCR): INVALID

## 2023-11-28 MED ORDER — METRONIDAZOLE 250 MG PO TABS *I*
500.0000 mg | ORAL_TABLET | Freq: Once | ORAL | Status: AC
Start: 2023-11-28 — End: 2023-11-28
  Administered 2023-11-28: 500 mg via ORAL
  Filled 2023-11-28: qty 2

## 2023-11-28 MED ORDER — METRONIDAZOLE 500 MG PO TABS *I*
500.0000 mg | ORAL_TABLET | Freq: Two times a day (BID) | ORAL | 0 refills | Status: AC
Start: 2023-11-28 — End: 2023-12-08
  Filled 2023-11-28: qty 14, 7d supply, fill #0

## 2023-11-28 NOTE — ED Notes (Signed)
Pt discharged home. D/C instructions reviewed with pt. Pt states understanding. Pt to pick up meds at pharmacy. Given first dose of abx here. Pt stable and ambulatory at time of discharge. Pt awaiting transportation. VSS
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
## 2023-11-28 NOTE — Discharge Instructions (Signed)
You were seen in the Emergency Department for shortness of breath and concern for vaginal issues your workup 2 days ago did come back positive for Gardnerella which is treatable with Flagyl you were given a prescription for Flagyl and encouraged to take it as directed and follow-up with OB/GYN.  Your examination here did not show any acute cause for your reported shortness of breath. It was determined that there was no need for hospitalization at this time and the best course of action is follow up with your primary doctor 24-48 hours.   Instructions: Follow up with your primary doctor in 24 - 48 hours. Please take an over the counter pain medication for pain as well as use ice or heating pad for comfort to the affected area. You should always discuss new medications with your primary care physician.   Return to the Emergency Department if your symptoms persist or worsen, especially if you experience severe increase in pain, shortness of breath, chest pain, headache, changes in vision, nausea, vomiting, fever, numbness, inability to walk, inability to eat or drink, or changes in behavior. Also please feel free to return to the ED for reevaluation at anytime you feel the condition warrants such a visit or you are unable contact your primary care doctor.

## 2023-12-01 ENCOUNTER — Other Ambulatory Visit: Payer: Self-pay

## 2023-12-02 ENCOUNTER — Ambulatory Visit: Payer: Self-pay | Admitting: Infectious Disease

## 2023-12-02 ENCOUNTER — Other Ambulatory Visit: Payer: Self-pay

## 2023-12-11 ENCOUNTER — Other Ambulatory Visit: Payer: Self-pay

## 2023-12-11 ENCOUNTER — Emergency Department
Admission: EM | Admit: 2023-12-11 | Discharge: 2023-12-11 | Disposition: A | Discharge status: Home / Self Care | Payer: Medicaid Other | Source: Ambulatory Visit | Attending: Emergency Medicine | Admitting: Emergency Medicine

## 2023-12-11 DIAGNOSIS — R531 Weakness: Secondary | ICD-10-CM | POA: Insufficient documentation

## 2023-12-11 DIAGNOSIS — F17210 Nicotine dependence, cigarettes, uncomplicated: Secondary | ICD-10-CM | POA: Insufficient documentation

## 2023-12-11 DIAGNOSIS — R52 Pain, unspecified: Secondary | ICD-10-CM

## 2023-12-11 MED ORDER — IBUPROFEN 400 MG PO TABS *I*
400.0000 mg | ORAL_TABLET | Freq: Once | ORAL | Status: AC
Start: 2023-12-11 — End: 2023-12-11
  Administered 2023-12-11: 400 mg via ORAL
  Filled 2023-12-11: qty 1

## 2023-12-11 MED ORDER — HYDROXYZINE HCL 10 MG PO TABS *I*
25.0000 mg | ORAL_TABLET | Freq: Once | ORAL | Status: AC
Start: 2023-12-11 — End: 2023-12-11
  Administered 2023-12-11: 25 mg via ORAL
  Filled 2023-12-11: qty 3

## 2023-12-11 NOTE — ED Triage Notes (Signed)
Patient kicked a trash can and now complaining of right sided weakness. Unable to move right side.     Prehospital medications given: No

## 2023-12-11 NOTE — Discharge Instructions (Signed)
You were seen here today due to complaining of pain over the entirety of the right side of your body including a headache after kicking a trash can for unknown reasons.  Kicking a trash can does not cause pain over the entire right side of your body or a headache.  You are standing easily on the foot with no evidence of weakness or deformity.  You have no swelling.  You have strong pulses and intact sensation throughout.  I have given you some medications to help decrease your symptoms.  You have no prescription for Topamax.  If you would like to follow-up with your primary care regarding Topamax you can do so.  Go back and forth between Tylenol and ibuprofen every 5 hours for the next 2 days to help with the headache.  Stay well-hydrated.

## 2023-12-11 NOTE — ED Notes (Signed)
Pt discharged per provider orders. VSS. Patient verbalized understanding of discharge paperwork, medication information, and specific instructions related to their situation, after explained by Clinical research associate. All questions answered by Clinical research associate. No IV present,  all belongings with pt at time of discharge. Pt dressed appropriately for weather. Pt  at baseline orientation, brought to discharge area via self ambulation. Pt has safe transportation via cab.

## 2023-12-19 ENCOUNTER — Encounter: Payer: Self-pay | Admitting: Emergency Medicine

## 2023-12-19 NOTE — ED Provider Notes (Signed)
History     Chief Complaint   Patient presents with    Extremity Weakness     Alexis Vang is a 32 y.o. female with history of anxiety, depression, extensive poly-substance abuse, paranoia, and many ED presentations with various and sundry complaints presents complaining of complete right-sided weakness after kicking a trash can. Uncertain the nature of the kick. Now cannot move the right side. Ambulated here. Has not taken any meds for the sx. Denies numbness. Denies other deficits.       Medical/Surgical/Family History     Past Medical History:   Diagnosis Date    Anxiety     Asthma     Depression     Gestational diabetes     Preeclampsia     Scabies     Substance abuse         Patient Active Problem List   Diagnosis Code    Paranoia F22    Scabies B86    High-risk pregnancy O09.90    PTSD (post-traumatic stress disorder) F43.10    History of cesarean section complicating pregnancy O34.219    H/O fetal anomaly in prior pregnancy, currently pregnant O09.299    History of gestational diabetes in prior pregnancy, currently pregnant O09.299, Z86.32    Hx of preeclampsia, prior pregnancy, currently pregnant O09.299    Substance use disorder F19.90    Unhoused Z59.00    Asthma during pregnancy O99.519, J45.909    Trichomonas infection A59.9    Staph aureus infection A49.01    Rash of hands R21    Suicidal ideation R45.851    Rubella non-immune status, antepartum O09.899, Z28.39    GBS (group B Streptococcus carrier), +RV culture, currently pregnant O99.820    History of cesarean delivery Z98.891    Complex care coordination Z71.89            Past Surgical History:   Procedure Laterality Date    CESAREAN SECTION, LOW TRANSVERSE      Left leg surgery      TONSILLECTOMY AND ADENOIDECTOMY            Social History     Tobacco Use    Smoking status: Every Day     Packs/day: .5     Types: Cigarettes     Start date: 2002    Smokeless tobacco: Former     Types: Snuff, Chew   Substance Use Topics    Alcohol use: Yes    Drug  use: Yes     Types: Cocaine, Methamphetamines, IV, Heroin, Marijuana             Review of Systems   Musculoskeletal:  Positive for arthralgias.   Neurological:  Positive for weakness.       Physical Exam     Triage Vitals  Triage Start: Start, (12/11/23 0340)  First Recorded BP: 169/69, Resp: 16, Temp: 36.2 C (97.2 F), Temp src: TEMPORAL Oxygen Therapy SpO2: 96 %, Oximetry Source: Rt Hand, O2 Device: None (Room air), Heart Rate: 69, (12/11/23 0342)  .  First Pain Reported  0-10 Scale: 6, (12/11/23 1610)       Physical Exam  Vitals and nursing note reviewed.   Constitutional:       General: She is not in acute distress.     Appearance: Normal appearance. She is not ill-appearing, toxic-appearing or diaphoretic.   HENT:      Head: Normocephalic and atraumatic.      Nose: Nose normal.  Mouth/Throat:      Mouth: Mucous membranes are moist.   Cardiovascular:      Rate and Rhythm: Normal rate.      Pulses: Normal pulses.   Pulmonary:      Effort: Pulmonary effort is normal.   Abdominal:      Palpations: Abdomen is soft.   Musculoskeletal:         General: No swelling, deformity or signs of injury. Normal range of motion.      Cervical back: Normal range of motion.   Skin:     General: Skin is warm and dry.      Coloration: Skin is not pale.      Findings: No bruising.   Neurological:      General: No focal deficit present.      Mental Status: She is alert and oriented to person, place, and time.   Psychiatric:         Mood and Affect: Mood is anxious.         Thought Content: Thought content normal.         Medical Decision Making       Patient seen by me on arrival date of 12/11/2023 at at time of arrival  3:39 AM.  Initial face to face evaluation time noted above may be discrepant due to patient acuity and delay in documentation.    Assessment:  32 y.o., female comes to the ED stating that she cannot move her right side after kicking a garbage can. Vitals normal. Exam without external evidence of injury or any  decreased in ROM or deformities or swelling. Pt is anxious.     Differential Diagnosis includes:  - Needing a warm place to be  - Anxiety  - Fracture / Dislocation - no evidence of this  - Nerve injury - no evidence of this    Plan:   Orders Placed This Encounter    hydrOXYzine HCl (ATARAX) tablet 25 mg    ibuprofen (ADVIL,MOTRIN) tablet 400 mg            Alexis Dallas, MD            Alexis Lain, MD  12/19/23 [phone]

## 2024-01-19 ENCOUNTER — Telehealth: Payer: Self-pay | Admitting: Emergency Medicine
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
## 2024-02-09 ENCOUNTER — Other Ambulatory Visit: Payer: Self-pay

## 2024-02-09 ENCOUNTER — Emergency Department
Admission: EM | Admit: 2024-02-09 | Discharge: 2024-02-09 | Disposition: A | Discharge status: Home / Self Care | Payer: Medicaid Other | Source: Ambulatory Visit | Attending: Emergency Medicine | Admitting: Emergency Medicine

## 2024-02-09 DIAGNOSIS — R0602 Shortness of breath: Secondary | ICD-10-CM

## 2024-02-09 DIAGNOSIS — R21 Rash and other nonspecific skin eruption: Secondary | ICD-10-CM | POA: Insufficient documentation

## 2024-02-09 DIAGNOSIS — M79641 Pain in right hand: Secondary | ICD-10-CM

## 2024-02-09 DIAGNOSIS — R509 Fever, unspecified: Secondary | ICD-10-CM

## 2024-02-09 DIAGNOSIS — R1110 Vomiting, unspecified: Secondary | ICD-10-CM

## 2024-02-09 DIAGNOSIS — R197 Diarrhea, unspecified: Secondary | ICD-10-CM

## 2024-02-09 DIAGNOSIS — L853 Xerosis cutis: Secondary | ICD-10-CM | POA: Insufficient documentation

## 2024-02-09 DIAGNOSIS — R1084 Generalized abdominal pain: Secondary | ICD-10-CM

## 2024-02-09 LAB — RUQ PANEL (ED ONLY)
ALT: 10 U/L (ref 0–35)
AST: 20 U/L (ref 0–35)
Albumin: 4.2 g/dL (ref 3.5–5.2)
Alk Phos: 86 U/L (ref 35–105)
Amylase: 125 U/L — ABNORMAL HIGH (ref 28–100)
Bili,Indirect: 0.3 mg/dL (ref 0.1–1.0)
Bilirubin,Direct: 0.2 mg/dL (ref 0.0–0.3)
Bilirubin,Total: 0.5 mg/dL (ref 0.0–1.2)
Lipase: 51 U/L (ref 13–60)
Total Protein: 7.6 g/dL (ref 6.3–7.7)

## 2024-02-09 LAB — BASIC METABOLIC PANEL
Anion Gap: 11 (ref 7–16)
CO2: 23 mmol/L (ref 20–28)
Calcium: 9.6 mg/dL (ref 8.8–10.2)
Chloride: 104 mmol/L (ref 96–108)
Creatinine: 0.7 mg/dL (ref 0.51–0.95)
Glucose: 92 mg/dL (ref 60–99)
Lab: 26 mg/dL — ABNORMAL HIGH (ref 6–20)
Potassium: 4.3 mmol/L (ref 3.3–5.1)
Sodium: 138 mmol/L (ref 133–145)
eGFR BY CREAT: 117 *

## 2024-02-09 LAB — CBC AND DIFFERENTIAL
Baso # K/uL: 0 10*3/uL (ref 0.0–0.2)
Eos # K/uL: 0.6 10*3/uL — ABNORMAL HIGH (ref 0.0–0.5)
Hematocrit: 41 % (ref 34–49)
Hemoglobin: 13.2 g/dL (ref 11.2–16.0)
IMM Granulocytes #: 0 10*3/uL (ref 0.0–0.0)
IMM Granulocytes: 0.2 %
Lymph # K/uL: 1.9 10*3/uL (ref 1.0–5.0)
MCV: 97 fL (ref 75–100)
Mono # K/uL: 0.6 10*3/uL (ref 0.1–1.0)
Neut # K/uL: 2.5 10*3/uL (ref 1.5–6.5)
Nucl RBC # K/uL: 0 10*3/uL (ref 0.0–0.0)
Nucl RBC %: 0 /100{WBCs} (ref 0.0–0.2)
Platelets: 228 10*3/uL (ref 150–450)
RBC: 4.2 MIL/uL (ref 4.0–5.5)
RDW: 12.9 % (ref 0.0–15.0)
Seg Neut %: 44.2 %
WBC: 5.6 10*3/uL (ref 3.5–11.0)

## 2024-02-09 LAB — SEDIMENTATION RATE, AUTOMATED: Sedimentation Rate: 42 mm/h — ABNORMAL HIGH (ref 0–20)

## 2024-02-09 LAB — CRP: CRP: 11 mg/L — ABNORMAL HIGH (ref 0–8)

## 2024-02-09 MED ORDER — HYDROCORTISONE 1 % EX CREA *I*
TOPICAL_CREAM | Freq: Two times a day (BID) | CUTANEOUS | Status: DC
Start: 2024-02-09 — End: 2024-04-09
  Filled 2024-02-09: qty 28

## 2024-02-09 NOTE — Discharge Instructions (Addendum)
You were seen in the emergency department for diffuse rash. Your lab work was all normal and did not show any signs of infection. You likely have an inflammatory skin condition that is chronic and worsened by irritation and excessive washing of your skin, as well as dryness of cold air exposure. We recommend that you continue to use steroid creams and barrier creams as prescribed and keep your follow up appointment as scheduled with dermatology.     Please return to the Emergency Department for:  Fever and chills, severe nausea and vomiting, shortness of breath or difficulty breathing  Or any worsening of your condition

## 2024-02-09 NOTE — ED Triage Notes (Signed)
Pt presents to the ED w/c/o a rash on her hands. PT states it has moved all over her body and states in some areas there are blisters.     Prehospital medications given: No

## 2024-02-09 NOTE — ED Provider Notes (Addendum)
History     Chief Complaint   Patient presents with    Skin Problem     [name] presents with a rash covering her whole body. Reports it has been present for years. It is worst on her hands. The rash is itchy all over her body. Her hands are also swollen and painful. Reports she has tried washing her hands but it hasn't helped. Hasn't tried anything else for the rash, no medications or lotion. Reports no recent medication use. Endorses recent fever and chills, abdominal pain and vomiting once three days ago, diarrhea once two days ago. Denies chest pain. Reports shortness of breath currently that is also worse with exertion and lying flat. Reports that she lives alone.       History provided by:  Patient        Medical/Surgical/Family History     Past Medical History:   Diagnosis Date    Anxiety     Asthma     Depression     Gestational diabetes     Preeclampsia     Scabies     Substance abuse         Patient Active Problem List   Diagnosis Code    Paranoia F22    Scabies B86    High-risk pregnancy O09.90    PTSD (post-traumatic stress disorder) F43.10    History of cesarean section complicating pregnancy O34.219    H/O fetal anomaly in prior pregnancy, currently pregnant O09.299    History of gestational diabetes in prior pregnancy, currently pregnant O09.299, Z86.32    Hx of preeclampsia, prior pregnancy, currently pregnant O09.299    Substance use disorder F19.90    Unhoused Z59.00    Asthma during pregnancy O99.519, J45.909    Trichomonas infection A59.9    Staph aureus infection A49.01    Rash of hands R21    Suicidal ideation R45.851    Rubella non-immune status, antepartum O09.899, Z28.39    GBS (group B Streptococcus carrier), +RV culture, currently pregnant O99.820    History of cesarean delivery Z98.891    Complex care coordination Z71.89            Past Surgical History:   Procedure Laterality Date    CESAREAN SECTION, LOW TRANSVERSE      Left leg surgery      TONSILLECTOMY AND ADENOIDECTOMY             Social History[1]          Review of Systems   Constitutional:  Positive for chills and fever.   HENT:  Negative for mouth sores and sore throat.    Respiratory:  Positive for shortness of breath.    Cardiovascular:  Negative for chest pain.   Gastrointestinal:  Positive for abdominal pain, diarrhea and vomiting.   Musculoskeletal:  Negative for arthralgias and myalgias.   Skin:  Positive for rash.   Neurological:  Negative for weakness.       Physical Exam     Triage Vitals  Triage Start: Start, (02/09/24 1359)  First Recorded BP: 117/65, Resp: 16, Temp: 36.6 C (97.9 F) Oxygen Therapy SpO2: 98 %, O2 Device: None (Room air), Heart Rate: 90, (02/09/24 1359)  .  First Pain Reported  0-10 Scale: 10, (02/09/24 1359)       Physical Exam  Constitutional:       General: She is not in acute distress.     Appearance: Normal appearance. She is not ill-appearing.   HENT:  Head: Normocephalic and atraumatic.      Nose: Nose normal.      Mouth/Throat:      Mouth: Mucous membranes are moist.      Pharynx: Oropharynx is clear. No posterior oropharyngeal erythema.   Eyes:      Extraocular Movements: Extraocular movements intact.      Conjunctiva/sclera: Conjunctivae normal.      Pupils: Pupils are equal, round, and reactive to light.   Pulmonary:      Effort: Pulmonary effort is normal.   Abdominal:      General: Abdomen is flat. There is no distension.      Palpations: Abdomen is soft. There is no mass.      Tenderness: There is abdominal tenderness.      Comments: Diffuse non-focal mild tenderness to palpation   Musculoskeletal:         General: Swelling and tenderness present.      Cervical back: Normal range of motion and neck supple. No tenderness.      Comments: No focal joint swelling or tenderness, diffuse tenderness of bilateral hands with tenderness on flexion and extension of fingers bilaterally   Lymphadenopathy:      Cervical: No cervical adenopathy.   Skin:     General: Skin is warm and dry.      Findings: Rash  present.      Comments: Diffuse maculopapular pustular rash diffusely distributed over extremities and trunk, sparing the face, palms and soles, pruritic, mildly erythematous, and coalescing into plaques over the PIP and dorsal surfaces of the hands, with cracking in the DIP and PIP joints of the palms. Mild swelling of bilateral hands with tenderness to palpation, non-focal, no areas of fluctuance or induration   Neurological:      General: No focal deficit present.      Mental Status: She is alert and oriented to person, place, and time.      Sensory: No sensory deficit.      Motor: No weakness.   Psychiatric:         Mood and Affect: Mood normal.         Behavior: Behavior normal.         Medical Decision Making   Patient seen by me on:  02/09/2024    Assessment:  33 yo F with pmhx of anxiety, depression, PTSD, paranoia and substance abuse presents with diffuse maculopapular pustular rash of unknown duration, sparing the face, mucous membranes, palms and soles, concerning for viral exanthem vs STI vs scabies vs drug eruption.     Differential diagnosis:  Viral exanthem, varicella, secondary syphilis, disseminated gonococcal infection, scabies, drug eruption    Plan:  CBC, BMP & RUQ panel, syphilis screen with reflex, ESR & CRP    ED Course and Disposition:  Patient roomed and vitals taken, history and physical exam performed. CBC, BMP and RUQ panel within normal limits. Syphilis screen with reflex in process. Clinical assessment is that patient most likely has a chronic inflammatory skin condition with hyperkeratosis worsened by dry air and excessive washing. Reviewed results and recommendation that she continue to use steroid creams as prescribed and keep follow-up with dermatology as scheduled.            Milly Jakob, MD      Resident Attestation:    Patient seen by me on 02/09/2024.  I saw and evaluated the patient. I agree with the resident's/fellow's findings and plan of care as documented above.    Author:  Clayton Lefort, DO         Milly Jakob, MD  Resident  02/09/24 2127         [1]   Social History  Tobacco Use    Smoking status: Every Day     Packs/day: .5     Types: Cigarettes     Start date: 2002    Smokeless tobacco: Former     Types: Snuff, Chew   Substance Use Topics    Alcohol use: Yes    Drug use: Yes     Types: Cocaine, Methamphetamines, IV, Heroin, Marijuana        Azile Minardi, DO  02/09/24 2356

## 2024-02-10 LAB — SYPHILIS SCREEN
Syphilis Screen: NEGATIVE
Syphilis Status: NONREACTIVE

## 2024-02-10 LAB — HOLD SST

## 2024-03-01 ENCOUNTER — Telehealth: Payer: Self-pay | Admitting: Emergency Medicine
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
## 2024-03-07 ENCOUNTER — Other Ambulatory Visit: Payer: Self-pay

## 2024-03-07 ENCOUNTER — Emergency Department
Admission: EM | Admit: 2024-03-07 | Discharge: 2024-03-07 | Disposition: A | Discharge status: Home / Self Care | Source: Ambulatory Visit | Attending: Emergency Medicine | Admitting: Emergency Medicine

## 2024-03-07 DIAGNOSIS — Z113 Encounter for screening for infections with a predominantly sexual mode of transmission: Secondary | ICD-10-CM | POA: Insufficient documentation

## 2024-03-07 DIAGNOSIS — R3915 Urgency of urination: Secondary | ICD-10-CM

## 2024-03-07 DIAGNOSIS — F17210 Nicotine dependence, cigarettes, uncomplicated: Secondary | ICD-10-CM | POA: Insufficient documentation

## 2024-03-07 DIAGNOSIS — N939 Abnormal uterine and vaginal bleeding, unspecified: Secondary | ICD-10-CM | POA: Insufficient documentation

## 2024-03-07 LAB — VAGINITIS SCREEN: DNA PROBE: Vaginitis Screen:DNA Probe: 0

## 2024-03-07 LAB — POCT URINALYSIS DIPSTICK
Glucose,UA POCT: NORMAL mg/dL
Ketones,UA POCT: NEGATIVE mg/dL
Leuk Esterase,UA POCT: NEGATIVE
Lot #: 1078267404
Nitrite,UA POCT: NEGATIVE
PH,UA POCT: 7 (ref 5–8)

## 2024-03-07 LAB — CHLAMYDIA NAAT (PCR): Chlamydia NAAT (PCR): NEGATIVE

## 2024-03-07 LAB — POCT URINE PREGNANCY
Lot #: 818004
Preg Test,UR POC: NEGATIVE

## 2024-03-07 LAB — TRICHOMONAS NAAT: Trichomonas NAAT: NEGATIVE

## 2024-03-07 LAB — N. GONORRHOEAE NAAT (PCR): N. gonorrhoeae NAAT (PCR): NEGATIVE

## 2024-03-07 NOTE — Discharge Instructions (Signed)
 You were seen in the Emergency Department for vaginal bleeding. You had a pelvic exam and swabs were taken and sent to the lab. We are referring you to OBGYN to follow up on your lab results and follow up on your symptoms. Please look out for a phone call from them to schedule an appointment.
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
## 2024-03-12 ENCOUNTER — Other Ambulatory Visit: Payer: Self-pay

## 2024-03-12 ENCOUNTER — Encounter: Payer: Self-pay | Admitting: Medical

## 2024-03-12 ENCOUNTER — Emergency Department
Admission: EM | Admit: 2024-03-12 | Discharge: 2024-03-12 | Disposition: A | Discharge status: Home / Self Care | Source: Ambulatory Visit | Attending: Student in an Organized Health Care Education/Training Program | Admitting: Student in an Organized Health Care Education/Training Program

## 2024-03-12 DIAGNOSIS — X58XXXA Exposure to other specified factors, initial encounter: Secondary | ICD-10-CM | POA: Insufficient documentation

## 2024-03-12 DIAGNOSIS — Y9289 Other specified places as the place of occurrence of the external cause: Secondary | ICD-10-CM | POA: Insufficient documentation

## 2024-03-12 DIAGNOSIS — T43594A Poisoning by other antipsychotics and neuroleptics, undetermined, initial encounter: Secondary | ICD-10-CM | POA: Insufficient documentation

## 2024-03-12 DIAGNOSIS — Y998 Other external cause status: Secondary | ICD-10-CM | POA: Insufficient documentation

## 2024-03-12 DIAGNOSIS — Z789 Other specified health status: Secondary | ICD-10-CM

## 2024-03-12 DIAGNOSIS — Y9389 Activity, other specified: Secondary | ICD-10-CM | POA: Insufficient documentation

## 2024-03-12 DIAGNOSIS — T50904A Poisoning by unspecified drugs, medicaments and biological substances, undetermined, initial encounter: Secondary | ICD-10-CM

## 2024-03-12 DIAGNOSIS — R5383 Other fatigue: Secondary | ICD-10-CM | POA: Insufficient documentation

## 2024-03-12 DIAGNOSIS — Y33XXXA Other specified events, undetermined intent, initial encounter: Secondary | ICD-10-CM

## 2024-03-12 DIAGNOSIS — T450X1A Poisoning by antiallergic and antiemetic drugs, accidental (unintentional), initial encounter: Secondary | ICD-10-CM

## 2024-03-12 LAB — BASIC METABOLIC PANEL
Anion Gap: 10 (ref 7–16)
CO2: 21 mmol/L (ref 20–28)
Calcium: 8.7 mg/dL — ABNORMAL LOW (ref 8.8–10.2)
Chloride: 106 mmol/L (ref 96–108)
Creatinine: 0.69 mg/dL (ref 0.51–0.95)
Glucose: 114 mg/dL — ABNORMAL HIGH (ref 60–99)
Lab: 20 mg/dL (ref 6–20)
Potassium: 4 mmol/L (ref 3.3–5.1)
Sodium: 137 mmol/L (ref 133–145)
eGFR BY CREAT: 118 *

## 2024-03-12 LAB — CBC AND DIFFERENTIAL
Baso # K/uL: 0 10*3/uL (ref 0.0–0.2)
Eos # K/uL: 0.2 10*3/uL (ref 0.0–0.5)
Hematocrit: 36 % (ref 34–49)
Hemoglobin: 11.7 g/dL (ref 11.2–16.0)
IMM Granulocytes #: 0 10*3/uL (ref 0.0–0.0)
IMM Granulocytes: 0.3 %
Lymph # K/uL: 1.9 10*3/uL (ref 1.0–5.0)
MCV: 93 fL (ref 75–100)
Mono # K/uL: 0.6 10*3/uL (ref 0.1–1.0)
Neut # K/uL: 3.5 10*3/uL (ref 1.5–6.5)
Nucl RBC # K/uL: 0 10*3/uL (ref 0.0–0.0)
Nucl RBC %: 0 /100{WBCs} (ref 0.0–0.2)
Platelets: 203 10*3/uL (ref 150–450)
RBC: 3.9 MIL/uL — ABNORMAL LOW (ref 4.0–5.5)
RDW: 13.1 % (ref 0.0–15.0)
Seg Neut %: 55.6 %
WBC: 6.2 10*3/uL (ref 3.5–11.0)

## 2024-03-12 NOTE — ED Triage Notes (Signed)
 Patient to the ED from home after taking more of her atarax "than normal" per EMS. EMS states she took 175mg  of atarax, patient states " it was in the alcohol I was drinking". Patient endorsing dizziness and lightheadedness, denies SI HI.     Prehospital medications given: No

## 2024-03-12 NOTE — Discharge Instructions (Signed)
 You were seen in the Emergency Department today for evaluation of taking too much hydroxyzine. Based on our evaluation it appears that you are stable for discharge.      Laboratory blood work blood counts stable, kidney function stable, electrolytes stable.  EKG is reassuring.    Take your medications as prescribed.    It is important to obtain follow up with your primary care physician whenever you have been evaluated in the Emergency Department. Please call your doctor's office and schedule an appointment.     Return to the Emergency Department if your symptoms persist or worsen.    Please return if you have any concerns and are unable to contact your PCP.     Thank you for allowing me to care for you today. Hope that you feel better soon.
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
## 2024-03-14 LAB — EKG 12-LEAD
P: 58 deg
PR: 160 ms
QRS: 59 deg
QRSD: 86 ms
QT: 371 ms
QTc: 421 ms
Rate: 77 {beats}/min
T: 111 deg

## 2024-03-29 ENCOUNTER — Telehealth: Payer: Self-pay

## 2024-03-29 ENCOUNTER — Emergency Department

## 2024-03-29 ENCOUNTER — Other Ambulatory Visit: Payer: Self-pay

## 2024-03-29 ENCOUNTER — Encounter: Payer: Self-pay | Admitting: Emergency Medicine

## 2024-03-29 ENCOUNTER — Emergency Department
Admission: EM | Admit: 2024-03-29 | Discharge: 2024-03-29 | Disposition: A | Discharge status: Home / Self Care | Source: Ambulatory Visit | Attending: Emergency Medicine | Admitting: Emergency Medicine

## 2024-03-29 DIAGNOSIS — Y9289 Other specified places as the place of occurrence of the external cause: Secondary | ICD-10-CM | POA: Insufficient documentation

## 2024-03-29 DIAGNOSIS — S5002XA Contusion of left elbow, initial encounter: Secondary | ICD-10-CM | POA: Insufficient documentation

## 2024-03-29 DIAGNOSIS — R110 Nausea: Secondary | ICD-10-CM

## 2024-03-29 DIAGNOSIS — T474X1A Poisoning by other laxatives, accidental (unintentional), initial encounter: Secondary | ICD-10-CM | POA: Insufficient documentation

## 2024-03-29 DIAGNOSIS — Y9389 Activity, other specified: Secondary | ICD-10-CM | POA: Insufficient documentation

## 2024-03-29 DIAGNOSIS — F17210 Nicotine dependence, cigarettes, uncomplicated: Secondary | ICD-10-CM | POA: Insufficient documentation

## 2024-03-29 DIAGNOSIS — M25522 Pain in left elbow: Secondary | ICD-10-CM

## 2024-03-29 DIAGNOSIS — Y998 Other external cause status: Secondary | ICD-10-CM | POA: Insufficient documentation

## 2024-03-29 MED ORDER — LOPERAMIDE HCL 2 MG PO CAPS *I*
2.0000 mg | ORAL_CAPSULE | Freq: Once | ORAL | Status: AC
Start: 2024-03-29 — End: 2024-03-29
  Administered 2024-03-29: 2 mg via ORAL
  Filled 2024-03-29: qty 1

## 2024-03-29 MED ORDER — LOPERAMIDE HCL 2 MG PO CAPS *I*
2.0000 mg | ORAL_CAPSULE | Freq: Four times a day (QID) | ORAL | 0 refills | Status: AC | PRN
Start: 2024-03-29 — End: 2024-04-28
  Filled 2024-03-29: qty 30, 7d supply, fill #0
  Filled 2024-03-30: qty 30, 8d supply, fill #0
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
## 2024-03-29 NOTE — Telephone Encounter (Signed)
 Copied from CRM #[id number]. Topic: Access to Care - Refer to Specialty or Service  >> Mar 29, 2024  4:17 PM Mick Sell T wrote:  [facility] - Telephone Request for Services    Referral Source:      Who is the caller? Patient  Contact information if different from the patient: n/a    Patient Information:    Patient's Name:  Alexis Vang   Patient's Date of Birth: 1991/07/04   Preferred Phone:  [phone]   Address: [address]  [place] [address]   [place] Born: [place] [[id number]]    Demographics Verified/Updated?Yes    Can messages regarding your mental health services be left at this number?Yes    Preferred Language:   English     Did someone interpret for the patient during the call?   No.    Would you need an interpreter for your visits?   No    Insurance Information:  Insurance to be billed for services:   Financial controller #:     Will care be covered by elective self-pay, WC, MVA, VA Benefits, or any other 3rd party coverage?  NO      What SERVICE(s) are you hoping to be connected to?    Individual Psychotherapy    What SYMPTOMS or concerns are you experiencing that prompted you to seek care?      (Provide a brief description of the reason the patient/referring provider is seeking treatment. Use patients own words where possible.)    low self esteem. Depression, anxiety sleep disorder,    Do you currently feel safe from harming yourself or others?     Yes
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
## 2024-03-29 NOTE — Discharge Instructions (Addendum)
 [name],    X-rays were negative for broken bone and elbow.    Rest, ice, Tylenol as needed.    We have prescribed medicine for diarrhea to take as needed    Follow-up with primary care doctor.  Return to ER with any worsening or new alarming symptoms
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
## 2024-03-29 NOTE — ED Notes (Signed)
 Patient discharge and follow up reviewed with patient who verbalized understanding. Patient dressed appropriately and belonging returned. Ambulated independently out of ED. Access to safe residence via [place]. VSS.

## 2024-03-30 ENCOUNTER — Other Ambulatory Visit: Payer: Self-pay

## 2024-03-30 ENCOUNTER — Emergency Department
Admission: EM | Admit: 2024-03-30 | Discharge: 2024-03-30 | Discharge status: Against Medical Advice | Source: Ambulatory Visit

## 2024-03-30 NOTE — ED Notes (Signed)
 Patient called multiple times without response. Will remove patient from system at this time.
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
## 2024-04-04 ENCOUNTER — Telehealth: Payer: Self-pay

## 2024-04-04 NOTE — Telephone Encounter (Signed)
 TC to pt, informed that [facility] is currently not accepting patients at this time due to limited access. Writer offered to send alternative mental health clinic information, pt declined.

## 2024-04-13 ENCOUNTER — Other Ambulatory Visit: Payer: Self-pay

## 2024-05-04 NOTE — Telephone Encounter (Signed)
 Patient rescheduled NPV for 7.11.2025 at 145pm w/Gloria Fluellen

## 2024-05-16 ENCOUNTER — Other Ambulatory Visit: Payer: Self-pay

## 2024-05-16 ENCOUNTER — Emergency Department
Admission: EM | Admit: 2024-05-16 | Discharge: 2024-05-17 | Disposition: A | Discharge status: Home / Self Care | Source: Ambulatory Visit | Attending: Student in an Organized Health Care Education/Training Program | Admitting: Student in an Organized Health Care Education/Training Program

## 2024-05-16 DIAGNOSIS — Y998 Other external cause status: Secondary | ICD-10-CM | POA: Insufficient documentation

## 2024-05-16 DIAGNOSIS — T7621XA Adult sexual abuse, suspected, initial encounter: Secondary | ICD-10-CM | POA: Insufficient documentation

## 2024-05-16 DIAGNOSIS — Y9389 Activity, other specified: Secondary | ICD-10-CM | POA: Insufficient documentation

## 2024-05-16 DIAGNOSIS — Z113 Encounter for screening for infections with a predominantly sexual mode of transmission: Secondary | ICD-10-CM | POA: Insufficient documentation

## 2024-05-16 DIAGNOSIS — Y9289 Other specified places as the place of occurrence of the external cause: Secondary | ICD-10-CM | POA: Insufficient documentation

## 2024-05-16 DIAGNOSIS — X58XXXA Exposure to other specified factors, initial encounter: Secondary | ICD-10-CM | POA: Insufficient documentation

## 2024-05-16 DIAGNOSIS — T7421XA Adult sexual abuse, confirmed, initial encounter: Secondary | ICD-10-CM

## 2024-05-16 DIAGNOSIS — Z114 Encounter for screening for human immunodeficiency virus [HIV]: Secondary | ICD-10-CM | POA: Insufficient documentation

## 2024-05-16 LAB — HOLD GREEN NO GEL

## 2024-05-16 LAB — RUQ PANEL (ED ONLY)
ALT: 10 U/L (ref 0–35)
AST: 24 U/L (ref 0–35)
Albumin: 4.2 g/dL (ref 3.5–5.2)
Alk Phos: 89 U/L (ref 35–105)
Amylase: 96 U/L (ref 28–100)
Bili,Indirect: 0.6 mg/dL (ref 0.1–1.0)
Bilirubin,Direct: 0.4 mg/dL — ABNORMAL HIGH (ref 0.0–0.3)
Bilirubin,Total: 1 mg/dL (ref 0.0–1.2)
Lipase: 32 U/L (ref 13–60)
Total Protein: 7.3 g/dL (ref 6.3–7.7)

## 2024-05-16 LAB — BASIC METABOLIC PANEL
Anion Gap: 11 (ref 7–16)
CO2: 19 mmol/L — ABNORMAL LOW (ref 20–28)
Calcium: 9 mg/dL (ref 8.8–10.2)
Chloride: 107 mmol/L (ref 96–108)
Creatinine: 0.66 mg/dL (ref 0.51–0.95)
Glucose: 82 mg/dL (ref 60–99)
Lab: 16 mg/dL (ref 6–20)
Potassium: 4.1 mmol/L (ref 3.3–5.1)
Sodium: 137 mmol/L (ref 133–145)
eGFR BY CREAT: 119 *

## 2024-05-16 LAB — CBC AND DIFFERENTIAL
Baso # K/uL: 0 10*3/uL (ref 0.0–0.2)
Eos # K/uL: 0.5 10*3/uL (ref 0.0–0.5)
Hematocrit: 44 % (ref 34–49)
Hemoglobin: 14 g/dL (ref 11.2–16.0)
IMM Granulocytes #: 0 10*3/uL (ref 0.0–0.0)
IMM Granulocytes: 0.2 %
Lymph # K/uL: 1.3 10*3/uL (ref 1.0–5.0)
MCV: 96 fL (ref 75–100)
Mono # K/uL: 0.7 10*3/uL (ref 0.1–1.0)
Neut # K/uL: 7.3 10*3/uL — ABNORMAL HIGH (ref 1.5–6.5)
Nucl RBC # K/uL: 0 10*3/uL (ref 0.0–0.0)
Nucl RBC %: 0 /100{WBCs} (ref 0.0–0.2)
Platelets: 223 10*3/uL (ref 150–450)
RBC: 4.6 MIL/uL (ref 4.0–5.5)
RDW: 13.2 % (ref 0.0–15.0)
Seg Neut %: 74.3 %
WBC: 9.9 10*3/uL (ref 3.5–11.0)

## 2024-05-16 LAB — PREGNANCY TEST, SERUM: Preg,Serum: NEGATIVE

## 2024-05-16 LAB — HOLD BLUE

## 2024-05-16 MED ORDER — DEXTROSE 5 % FLUSH FOR PUMPS *I*
0.0000 mL/h | INTRAVENOUS | Status: DC | PRN
Start: 2024-05-16 — End: 2024-07-15

## 2024-05-16 MED ORDER — SODIUM CHLORIDE 0.9 % FLUSH FOR PUMPS *I*
0.0000 mL/h | INTRAVENOUS | Status: DC | PRN
Start: 2024-05-16 — End: 2024-05-17
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
## 2024-05-16 NOTE — ED Notes (Signed)
 Refused IV at this time. Blood work obtained.
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
## 2024-05-17 ENCOUNTER — Other Ambulatory Visit: Payer: Self-pay

## 2024-05-17 DIAGNOSIS — T7421XA Adult sexual abuse, confirmed, initial encounter: Secondary | ICD-10-CM

## 2024-05-17 LAB — CHLAMYDIA NAAT (PCR): Chlamydia NAAT (PCR): NEGATIVE

## 2024-05-17 LAB — HEPATITIS B SURFACE ANTIGEN: HBV S Ag: NEGATIVE

## 2024-05-17 LAB — DRUG SCREEN CHEMICAL DEPENDENCY, URINE
Amphetamine,UR: NEGATIVE
Benzodiazepinen,UR: NEGATIVE
Cocaine/Metab,UR: NEGATIVE
Fentanyl, UR: NEGATIVE ng/mL
Opiates,UR: NEGATIVE
THC Metabolite,UR: NEGATIVE

## 2024-05-17 LAB — HEPATITIS C RNA, QUANTITATIVE, PCR

## 2024-05-17 LAB — TRICHOMONAS NAAT: Trichomonas NAAT: NEGATIVE

## 2024-05-17 LAB — SYPHILIS SCREEN
Syphilis Screen: NEGATIVE
Syphilis Status: NONREACTIVE

## 2024-05-17 LAB — HIV-1/2  ANTIGEN/ANTIBODY SCREEN WITH CONFIRMATION: HIV 1&2 ANTIGEN/ANTIBODY: NONREACTIVE

## 2024-05-17 LAB — N. GONORRHOEAE NAAT (PCR): N. gonorrhoeae NAAT (PCR): NEGATIVE

## 2024-05-17 LAB — HEPATITIS B SURFACE ANTIBODY
HBV S Ab Quant: 369.52 m[IU]/mL
HBV S Ab: POSITIVE

## 2024-05-17 MED ORDER — METRONIDAZOLE 500 MG PO TABS *I*
500.0000 mg | ORAL_TABLET | Freq: Two times a day (BID) | ORAL | 0 refills | Status: DC
Start: 2024-05-17 — End: 2024-05-18
  Filled 2024-05-17: qty 14, 7d supply, fill #0

## 2024-05-17 MED ORDER — METRONIDAZOLE 500 MG PO TABS *I*
500.0000 mg | ORAL_TABLET | Freq: Two times a day (BID) | ORAL | Status: DC
Start: 2024-05-17 — End: 2024-05-17

## 2024-05-17 MED ORDER — DOLUTEGRAVIR SODIUM 50 MG PO TABS *I*
50.0000 mg | ORAL_TABLET | Freq: Once | ORAL | Status: AC
Start: 2024-05-17 — End: 2024-05-17
  Administered 2024-05-17: 50 mg via ORAL
  Filled 2024-05-17: qty 1

## 2024-05-17 MED ORDER — EMTRICITABINE-TENOFOVIR 200-300 MG PO TABS *I*
1.0000 | ORAL_TABLET | Freq: Once | ORAL | Status: AC
Start: 2024-05-17 — End: 2024-05-17
  Administered 2024-05-17: 1 via ORAL
  Filled 2024-05-17: qty 1

## 2024-05-17 MED ORDER — DOXYCYCLINE HYCLATE 100 MG PO TABS *I*
100.0000 mg | ORAL_TABLET | Freq: Two times a day (BID) | ORAL | Status: DC
Start: 2024-05-17 — End: 2024-05-17

## 2024-05-17 MED ORDER — EMTRICITABINE-TENOFOVIR 200-300 MG PO TABS *I*
1.0000 | ORAL_TABLET | Freq: Every day | ORAL | 0 refills | Status: DC
Start: 2024-05-17 — End: 2024-05-18
  Filled 2024-05-17 (×2): qty 7, 7d supply, fill #0

## 2024-05-17 MED ORDER — CEFTRIAXONE SODIUM 1 GM IV/IJ SOLR *WRAPPED*
500.0000 mg | Freq: Once | INTRAMUSCULAR | Status: AC
Start: 2024-05-17 — End: 2024-05-17
  Administered 2024-05-17: 500 mg via INTRAVENOUS
  Filled 2024-05-17: qty 10

## 2024-05-17 MED ORDER — DOXYCYCLINE HYCLATE 100 MG PO CAPS *I*
100.0000 mg | ORAL_CAPSULE | Freq: Two times a day (BID) | ORAL | 0 refills | Status: AC
Start: 2024-05-17 — End: 2024-05-24
  Filled 2024-05-17: qty 14, 7d supply, fill #0

## 2024-05-17 MED ORDER — (HIV PEP KIT) DOLUTEGRAVIR SODIUM 50 MG PO TABS *I*
50.0000 mg | ORAL_TABLET | Freq: Every day | ORAL | 0 refills | Status: DC
Start: 2024-05-17 — End: 2024-11-07
  Filled 2024-05-17: qty 7, 7d supply, fill #0

## 2024-05-17 MED ORDER — ULIPRISTAL ACETATE 30 MG PO TABS *I*
30.0000 mg | ORAL_TABLET | Freq: Once | ORAL | Status: AC
Start: 2024-05-17 — End: 2024-05-17
  Administered 2024-05-17: 30 mg via ORAL
  Filled 2024-05-17: qty 1
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
## 2024-05-17 NOTE — ED Notes (Signed)
 Pt discharged per provider. Discharge instructions reviewed, pt verbalized understanding. IV removed upon discharge. VSS. A&Ox4. Pt left ED ambulatory independently with all personal belongings and access to residence.

## 2024-05-18 ENCOUNTER — Other Ambulatory Visit: Payer: Self-pay

## 2024-05-18 ENCOUNTER — Ambulatory Visit: Payer: Self-pay | Attending: Infectious Disease | Admitting: Infectious Disease

## 2024-05-18 VITALS — BP 116/73 | HR 69 | Temp 96.1°F | Resp 16 | Ht 64.96 in | Wt 168.7 lb

## 2024-05-18 DIAGNOSIS — Z206 Contact with and (suspected) exposure to human immunodeficiency virus [HIV]: Secondary | ICD-10-CM | POA: Insufficient documentation

## 2024-05-18 DIAGNOSIS — Z1159 Encounter for screening for other viral diseases: Secondary | ICD-10-CM | POA: Insufficient documentation

## 2024-05-18 DIAGNOSIS — Z7721 Contact with and (suspected) exposure to potentially hazardous body fluids: Secondary | ICD-10-CM | POA: Insufficient documentation

## 2024-05-18 MED ORDER — DOLUTEGRAVIR SODIUM 50 MG PO TABS *I*
50.0000 mg | ORAL_TABLET | Freq: Every day | ORAL | 0 refills | Status: DC
Start: 2024-05-18 — End: 2024-05-18
  Filled 2024-05-18: qty 28, 28d supply, fill #0

## 2024-05-18 MED ORDER — METRONIDAZOLE 500 MG PO TABS *I*
500.0000 mg | ORAL_TABLET | Freq: Two times a day (BID) | ORAL | 0 refills | Status: DC
Start: 2024-05-18 — End: 2024-05-18
  Filled 2024-05-18: qty 14, 7d supply, fill #0

## 2024-05-18 MED ORDER — EMTRICITABINE-TENOFOVIR 200-300 MG PO TABS *I*
1.0000 | ORAL_TABLET | Freq: Every day | ORAL | 0 refills | Status: DC
Start: 2024-05-18 — End: 2024-05-18
  Filled 2024-05-18: qty 21, 21d supply, fill #0
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
## 2024-05-19 ENCOUNTER — Other Ambulatory Visit: Payer: Self-pay

## 2024-05-24 ENCOUNTER — Other Ambulatory Visit: Payer: Self-pay

## 2024-05-28 ENCOUNTER — Other Ambulatory Visit: Payer: Self-pay

## 2024-05-28 ENCOUNTER — Emergency Department

## 2024-05-28 ENCOUNTER — Emergency Department
Admission: EM | Admit: 2024-05-28 | Discharge: 2024-05-28 | Disposition: A | Discharge status: Home / Self Care | Source: Ambulatory Visit | Attending: Emergency Medicine | Admitting: Emergency Medicine

## 2024-05-28 DIAGNOSIS — F17210 Nicotine dependence, cigarettes, uncomplicated: Secondary | ICD-10-CM | POA: Insufficient documentation

## 2024-05-28 DIAGNOSIS — Z114 Encounter for screening for human immunodeficiency virus [HIV]: Secondary | ICD-10-CM | POA: Insufficient documentation

## 2024-05-28 DIAGNOSIS — Z118 Encounter for screening for other infectious and parasitic diseases: Secondary | ICD-10-CM | POA: Insufficient documentation

## 2024-05-28 DIAGNOSIS — R102 Pelvic and perineal pain: Secondary | ICD-10-CM | POA: Insufficient documentation

## 2024-05-28 DIAGNOSIS — Z113 Encounter for screening for infections with a predominantly sexual mode of transmission: Secondary | ICD-10-CM | POA: Insufficient documentation

## 2024-05-28 LAB — POCT GLUCOSE: Glucose POCT: 72 mg/dL (ref 60–99)
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
## 2024-05-28 NOTE — Discharge Instructions (Addendum)
 You were seen and evaluated in the emergency department today for concern for vaginal foreign body.    On exam you are found to have no retained objects in the rectum or the vagina.    Please follow your MyChart for STI results.  Additionally recommend following up with your GYN provider for further management.    Instructions:   1.  Please Continue to take your home medications as previously prescribed.   2.  Please call your primary care doctor to let them know you were seen in the Emergency department  Please make a follow-up with them in the next 2-3 days.    Imaging:  - You had a pelvic x-ray performed that showed bowel gas but no acute concern for retained foreign bodies or fractures of the pelvis.    Medications:  - Please take all of your home medications as previously prescribed.    Follow-Up:  - Please call your primary care doctor as soon as possible to make a follow-up appointment from your hospital visit.      Return to Emergency Department:  -We are always happy to re-evaluate you.  Return to the emergency department with any: fever, chills, nausea, vomiting, chest pain, shortness of breath, abdominal pain, dizziness, headache, light-headedness, or for any other symptoms which concern you.
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
## 2024-05-29 LAB — CHLAMYDIA NAAT (PCR): Chlamydia NAAT (PCR): NEGATIVE

## 2024-05-29 LAB — SYPHILIS SCREEN
Syphilis Screen: NEGATIVE
Syphilis Status: NONREACTIVE

## 2024-05-29 LAB — N. GONORRHOEAE NAAT (PCR): N. gonorrhoeae NAAT (PCR): NEGATIVE

## 2024-05-29 LAB — HIV-1/2  ANTIGEN/ANTIBODY SCREEN WITH CONFIRMATION: HIV 1&2 ANTIGEN/ANTIBODY: NONREACTIVE

## 2024-05-30 ENCOUNTER — Telehealth: Payer: Self-pay | Admitting: Infectious Disease

## 2024-05-30 ENCOUNTER — Telehealth: Payer: Self-pay

## 2024-05-30 NOTE — Telephone Encounter (Signed)
 Copied from CRM [id number]. Topic: Access to Care - Speak to Provider/Office Staff  >> May 30, 2024  8:31 AM Sharrell Deck wrote:  .[name], Patient, is requesting a return call to review recent test results.    What type of test was done? STD etc    Where was the test done? [facility] ED    On what day was the test done? 5/31    Please return the patients call at [phone].

## 2024-05-30 NOTE — Telephone Encounter (Signed)
 Pt was our pt in the past and will be re-establishing care 07/08/24. She called today to inquire about test results done 05/28/24 in the ER. Reviewed all testing: GC/CT/HIV/Syphilis were negative. She also wanted to confirm there was no foreign body found . Chart notes indicate no FB was found. Pt satisfied with discussion.

## 2024-05-30 NOTE — Telephone Encounter (Signed)
 Nursing called the pt.     Pt told 3-4 times that all blood tests were neg.     Pt finally stated understanding.

## 2024-06-01 ENCOUNTER — Other Ambulatory Visit: Payer: Self-pay

## 2024-06-02 ENCOUNTER — Other Ambulatory Visit: Payer: Self-pay

## 2024-06-14 ENCOUNTER — Ambulatory Visit: Payer: Self-pay | Admitting: Infectious Disease

## 2024-06-15 ENCOUNTER — Emergency Department
Admission: EM | Admit: 2024-06-15 | Discharge: 2024-06-16 | Disposition: A | Discharge status: Home / Self Care | Source: Ambulatory Visit | Attending: Emergency Medicine | Admitting: Emergency Medicine

## 2024-06-15 ENCOUNTER — Ambulatory Visit: Payer: Self-pay | Admitting: Infectious Disease

## 2024-06-15 DIAGNOSIS — R21 Rash and other nonspecific skin eruption: Secondary | ICD-10-CM | POA: Insufficient documentation

## 2024-06-15 DIAGNOSIS — F17210 Nicotine dependence, cigarettes, uncomplicated: Secondary | ICD-10-CM | POA: Insufficient documentation

## 2024-06-15 NOTE — ED Triage Notes (Signed)
 Pt c/o rash to bilateral forearms- skin appears dry, cracking.       Prehospital medications given: No

## 2024-06-16 ENCOUNTER — Encounter: Payer: Self-pay | Admitting: Emergency Medicine

## 2024-06-16 NOTE — ED Provider Notes (Signed)
 History     Chief Complaint   Patient presents with    Skin Problem     [place] Alexis Vang is a 33 y.o. female with a history of anxiety, depression, and substance abuse.  She presents with dry cracked skin of her bilateral hands.            Medical/Surgical/Family History     Past Medical History:   Diagnosis Date    Anxiety     Asthma     Depression     Gestational diabetes     Preeclampsia     Scabies     Substance abuse         Patient Active Problem List   Diagnosis Code    Paranoia F22    Scabies B86    High-risk pregnancy O09.90    PTSD (post-traumatic stress disorder) F43.10    History of cesarean section complicating pregnancy O34.219    H/O fetal anomaly in prior pregnancy, currently pregnant O09.299    History of gestational diabetes in prior pregnancy, currently pregnant O09.299, Z86.32    Hx of preeclampsia, prior pregnancy, currently pregnant O09.299    Substance use disorder F19.90    Unhoused Z59.00    Asthma during pregnancy O99.519, J45.909    Trichomonas infection A59.9    Staph aureus infection A49.01    Rash of hands R21    Suicidal ideation R45.851    Rubella non-immune status, antepartum O09.899, Z28.39    GBS (group B Streptococcus carrier), +RV culture, currently pregnant O99.820    History of cesarean delivery Z98.891    Complex care coordination Z71.89            Past Surgical History:   Procedure Laterality Date    CESAREAN SECTION, LOW TRANSVERSE      Left leg surgery      TONSILLECTOMY AND ADENOIDECTOMY            Social History[1]          Review of Systems    Physical Exam     Triage Vitals     First Recorded BP: 112/62, Resp: 16, Temp: 36.4 C (97.5 F) Oxygen Therapy SpO2: 100 %, O2 Device: None (Room air), Heart Rate: 75, (06/15/24 1950)  .  First Pain Reported 0-10  Pain Scale: 10, (06/15/24 1950)       Physical Exam  Vitals and nursing note reviewed.   Constitutional:       General: She is not in acute distress.     Appearance: She is well-developed. She is not ill-appearing,  toxic-appearing or diaphoretic.   HENT:      Head: Normocephalic and atraumatic.      Right Ear: External ear normal.      Left Ear: External ear normal.      Nose: Nose normal.   Eyes:      General:         Right eye: No discharge.         Left eye: No discharge.   Cardiovascular:      Rate and Rhythm: Normal rate.   Pulmonary:      Effort: Pulmonary effort is normal. No respiratory distress.   Musculoskeletal:         General: Normal range of motion.   Skin:     General: Skin is warm and dry.      Comments: Dry cracked skin on dorsal aspect of bilateral hands.    Neurological:  Mental Status: She is alert.      Motor: No abnormal muscle tone.   Psychiatric:         Mood and Affect: Affect is angry.         Medical Decision Making   Patient seen by me on:  06/15/2024    Assessment:  Alexis Vang is a 33 y.o. female with a history of anxiety, depression, and substance abuse.  She presents with dry cracked skin of her bilateral hands.    On exam, patient has dry skin on the dorsal aspect of bilateral hands.  Skin appearance is not clinically consistent with an infection and not clinically consistent with scabies.  Patient advised to apply lotion moisturizer.    Differential diagnosis:  Dry skin, eczema, not consistent with infection.    Plan:    Discharge     Consideration of hospitalization: Hospital-based care and hospitalization not indicated.  Patient discharged with instructions to follow-up with PCP.             Vang Denise, MD              [1]   Social History  Tobacco Use    Smoking status: Every Day     Packs/day: .5     Types: Cigarettes     Start date: 2002    Smokeless tobacco: Former     Types: Snuff, Chew   Substance Use Topics    Alcohol  use: Yes    Drug use: Yes     Types: Cocaine , Methamphetamines, IV, Heroin, Marijuana        Alexis Mabel, MD  07/02/24 0023

## 2024-06-16 NOTE — Discharge Instructions (Addendum)
Call your doctor to make a follow-up appointment.    If any symptoms worsen or new symptoms develop that are concerning to you, call your doctor or return to the Emergency Department.

## 2024-06-16 NOTE — ED Notes (Signed)
 Patient discharged by provider in [facility] - AVS given to patient by provider. Medicab ordered.

## 2024-06-17 ENCOUNTER — Other Ambulatory Visit: Payer: Self-pay

## 2024-07-04 ENCOUNTER — Emergency Department
Admission: EM | Admit: 2024-07-04 | Discharge: 2024-07-04 | Disposition: A | Discharge status: Home / Self Care | Source: Ambulatory Visit | Attending: Emergency Medicine | Admitting: Emergency Medicine

## 2024-07-04 ENCOUNTER — Encounter: Payer: Self-pay | Admitting: Emergency Medicine

## 2024-07-04 ENCOUNTER — Other Ambulatory Visit: Payer: Self-pay

## 2024-07-04 DIAGNOSIS — F17210 Nicotine dependence, cigarettes, uncomplicated: Secondary | ICD-10-CM | POA: Insufficient documentation

## 2024-07-04 DIAGNOSIS — Z711 Person with feared health complaint in whom no diagnosis is made: Secondary | ICD-10-CM | POA: Insufficient documentation

## 2024-07-04 NOTE — ED Notes (Signed)
Patient called for vital signs, not present in waiting room at this time.
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
## 2024-07-04 NOTE — ED Notes (Signed)
 Patient called for vital signs and again remains not present in waiting room.

## 2024-07-04 NOTE — Discharge Instructions (Signed)
 Follow up with PCP    Return to ED if you have worsening severe symptoms incl shortness of breath that is worsening, chest pain, or if you have other concerns.

## 2024-07-08 ENCOUNTER — Ambulatory Visit: Payer: Self-pay | Attending: Obstetrics and Gynecology | Admitting: Obstetrics and Gynecology

## 2024-07-08 ENCOUNTER — Other Ambulatory Visit: Payer: Self-pay

## 2024-07-08 ENCOUNTER — Encounter: Payer: Self-pay | Admitting: Obstetrics and Gynecology

## 2024-07-08 VITALS — BP 121/79 | HR 62 | Ht 65.75 in | Wt 174.6 lb

## 2024-07-08 DIAGNOSIS — Z113 Encounter for screening for infections with a predominantly sexual mode of transmission: Secondary | ICD-10-CM | POA: Insufficient documentation

## 2024-07-08 DIAGNOSIS — Z01419 Encounter for gynecological examination (general) (routine) without abnormal findings: Secondary | ICD-10-CM | POA: Insufficient documentation

## 2024-07-08 DIAGNOSIS — Z8742 Personal history of other diseases of the female genital tract: Secondary | ICD-10-CM | POA: Insufficient documentation

## 2024-07-08 DIAGNOSIS — Z1331 Encounter for screening for depression: Secondary | ICD-10-CM | POA: Insufficient documentation

## 2024-07-08 DIAGNOSIS — F99 Mental disorder, not otherwise specified: Secondary | ICD-10-CM | POA: Insufficient documentation

## 2024-07-08 DIAGNOSIS — R21 Rash and other nonspecific skin eruption: Secondary | ICD-10-CM | POA: Insufficient documentation

## 2024-07-08 DIAGNOSIS — Z8632 Personal history of gestational diabetes: Secondary | ICD-10-CM | POA: Insufficient documentation

## 2024-07-08 MED ORDER — HYDROCORTISONE 2.5 % EX CREA *I*
TOPICAL_CREAM | Freq: Two times a day (BID) | CUTANEOUS | 1 refills | Status: DC
Start: 2024-07-08 — End: 2024-07-08

## 2024-07-08 MED ORDER — TRIAMCINOLONE ACETONIDE 0.1 % EX OINT *I*
TOPICAL_OINTMENT | Freq: Two times a day (BID) | CUTANEOUS | 1 refills | Status: AC
Start: 2024-07-08 — End: ?
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
## 2024-07-08 NOTE — Progress Notes (Addendum)
 07/08/24 1508   Demographics   [place] [employer] [place] Information [employer]   Agency Involvement   Contacts/Support Systems Other  ([facility] care manger, Alexis Vang)   Time Spent   Time Spent with Patient (min) 20  (care corrdination)      SW received a TC from Alexis Vang, [facility] care Production designer, theatre/television/film, [phone]) returned call, we eventually connected. She thought that she was calling a PCP office- clarified - OB/GYN/ Gender wellness, and  Pt did make it in to establish care. SW informed her that she is in need of a PCP, per NP Meade Piedmont, and per chart, she had been in contact with [facility] in the past;  they are taking new patients.     Plan: Alexis Vang will work to link her to a PCP, additionally, she made a [facility] referral for Pt to [place], She is also  enrolled in HARP services, possibly also with [employer].     Alexis Vang, LMSW [phone]

## 2024-07-09 LAB — N. GONORRHOEAE NAAT (PCR): N. gonorrhoeae NAAT (PCR): NEGATIVE

## 2024-07-09 LAB — CHLAMYDIA NAAT (PCR): Chlamydia NAAT (PCR): NEGATIVE

## 2024-07-10 ENCOUNTER — Ambulatory Visit: Payer: Self-pay | Admitting: Obstetrics and Gynecology

## 2024-07-10 DIAGNOSIS — R87619 Unspecified abnormal cytological findings in specimens from cervix uteri: Secondary | ICD-10-CM

## 2024-07-11 LAB — TRICHOMONAS NAAT (PCR): Trichomonas NAAT (PCR): NEGATIVE

## 2024-07-11 LAB — N. GONORRHOEAE NAAT (PCR): N. gonorrhoeae NAAT (PCR): NEGATIVE

## 2024-07-11 LAB — CHLAMYDIA NAAT (PCR): Chlamydia NAAT (PCR): NEGATIVE

## 2024-07-12 LAB — HPV DNA PROBE WITH CYTOLOGY
HPV Other High Risk: NEGATIVE
HPV Type 16: NEGATIVE
HPV Type 18: NEGATIVE

## 2024-07-14 ENCOUNTER — Telehealth: Payer: Self-pay

## 2024-07-14 NOTE — Telephone Encounter (Signed)
 TC from pt to follow up on test results from 7/11. Testing reviewed with pt who verbalizes understanding.    7/29 appt time discussed. MyChart customer service number given so pt may access the app, reports being locked out. Writer helped pt with MyChart activation at her visit 7/11.

## 2024-07-18 LAB — GYN CYTOLOGY

## 2024-07-19 DIAGNOSIS — R87619 Unspecified abnormal cytological findings in specimens from cervix uteri: Secondary | ICD-10-CM | POA: Insufficient documentation

## 2024-07-26 ENCOUNTER — Encounter: Payer: Self-pay | Admitting: Obstetrics and Gynecology

## 2024-07-26 ENCOUNTER — Other Ambulatory Visit: Payer: Self-pay

## 2024-07-26 ENCOUNTER — Ambulatory Visit: Payer: Self-pay | Admitting: Obstetrics and Gynecology

## 2024-07-26 ENCOUNTER — Ambulatory Visit: Payer: Self-pay | Attending: Obstetrics and Gynecology | Admitting: Obstetrics and Gynecology

## 2024-07-26 VITALS — BP 121/59 | HR 85 | Ht 64.0 in | Wt 171.0 lb

## 2024-07-26 DIAGNOSIS — Z113 Encounter for screening for infections with a predominantly sexual mode of transmission: Secondary | ICD-10-CM | POA: Insufficient documentation

## 2024-07-26 LAB — CHLAMYDIA NAAT (PCR): Chlamydia NAAT (PCR): NEGATIVE

## 2024-07-26 LAB — POCT VAGINAL WET MOUNT
CLUE CELLS,POC: NEGATIVE
KOH FOR FUNGUS: NEGATIVE
LEUKOCYTES,POC: NEGATIVE
Nitrazine PH, POCT: 4.5 (ref <=4.5)
TRICHOMONAS, POC: NEGATIVE
WHIFF TEST: NEGATIVE
YEAST,POC: NEGATIVE

## 2024-07-26 LAB — TRICHOMONAS NAAT: Trichomonas NAAT: NEGATIVE

## 2024-07-26 LAB — N. GONORRHOEAE NAAT (PCR): N. gonorrhoeae NAAT (PCR): NEGATIVE

## 2024-07-26 NOTE — Patient Instructions (Signed)
 2-1-1/Lif[facility]  24 hours per day, 7 days per week (including holidays) crisis/suicide intervention program and Information & Referral (I&R) service serving [place], [place], [place], [place], [place] and [place].  Assistance and referrals for:  Emergency food, shelter, clothing  Crisis counseling and substance abuse issues  Financial and legal issues  Physical and metals health needs  And more    Phone #: Dial 2-1-1 or 6621102708  Web site: www.[facility].org          Resources for Mental Health Support:     [facility] Crisis Call Line: [phone]  Hours of operation: Monday - Friday 7:00 am - 8:00 pm and Saturday & Sunday 10:00 am - 6:30 pm.     [place] COVID-[place] Emotional Support Line: [phone]     Lifeline: 211     [employer] Self-Help Drop-In Support Services  [address] [place], [place] [address]  Drop-In Hours: Daily 5:00 PM - 9:00 PM     [facility] Crisis [facility] at [facility]  [address] [place], [place] [address] [phone]  Monday - Friday 8:00 AM - 10:00 PM
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
## 2024-08-02 ENCOUNTER — Telehealth: Payer: Self-pay

## 2024-08-02 NOTE — Telephone Encounter (Signed)
 Incoming call from Patient regarding + test results.    Writer transferred the patient to the nursing staff due to the requesting results

## 2024-08-02 NOTE — Telephone Encounter (Signed)
 Pt calling to review test results. Pt told all test results that were completed on 07/26/24 are negative. Pt encouraged to go to any [facility] lab to get her blood work completed. Pt verbalized understanding and is agreeable with plan.

## 2024-08-03 ENCOUNTER — Other Ambulatory Visit
Admission: RE | Admit: 2024-08-03 | Discharge: 2024-08-03 | Disposition: A | Discharge status: Home / Self Care | Source: Ambulatory Visit | Attending: Obstetrics and Gynecology | Admitting: Obstetrics and Gynecology

## 2024-08-03 DIAGNOSIS — Z206 Contact with and (suspected) exposure to human immunodeficiency virus [HIV]: Secondary | ICD-10-CM | POA: Insufficient documentation

## 2024-08-03 DIAGNOSIS — Z8632 Personal history of gestational diabetes: Secondary | ICD-10-CM | POA: Insufficient documentation

## 2024-08-03 DIAGNOSIS — Z7721 Contact with and (suspected) exposure to potentially hazardous body fluids: Secondary | ICD-10-CM | POA: Insufficient documentation

## 2024-08-03 DIAGNOSIS — Z113 Encounter for screening for infections with a predominantly sexual mode of transmission: Secondary | ICD-10-CM | POA: Insufficient documentation

## 2024-08-03 LAB — HEMOGLOBIN A1C: Hemoglobin A1C: 5.2 % (ref <=5.6)

## 2024-08-04 ENCOUNTER — Telehealth: Payer: Self-pay

## 2024-08-04 ENCOUNTER — Ambulatory Visit: Payer: Self-pay | Admitting: Infectious Disease

## 2024-08-04 LAB — HEPATITIS A IGG AB: Hepatitis A IGG: NEGATIVE

## 2024-08-04 LAB — SYPHILIS SCREEN
Syphilis Screen: NEGATIVE
Syphilis Status: NONREACTIVE

## 2024-08-04 LAB — HIV-1/2  ANTIGEN/ANTIBODY SCREEN WITH CONFIRMATION: HIV 1&2 ANTIGEN/ANTIBODY: NONREACTIVE

## 2024-08-04 LAB — HEPATITIS C VIRUS ANTIBODY WITH REFLEX TO HEPATITIS C QUANTITATIVE: Hep C Ab: NEGATIVE

## 2024-08-04 LAB — HEPATITIS B SURFACE ANTIGEN: HBV S Ag: NEGATIVE

## 2024-08-04 NOTE — Telephone Encounter (Signed)
 Pt calling to back to re confirm negative testing from yesterday and 07/25/2024. Confirm negative STI testing, normal/neg pap and AIC 5.2

## 2024-08-04 NOTE — Telephone Encounter (Signed)
 Patient calling to follow up on test results; reviewed that all STI testing is negative/normal.

## 2024-08-05 ENCOUNTER — Telehealth: Payer: Self-pay | Admitting: Emergency Medicine
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
## 2024-08-11 LAB — UNMAPPED LAB RESULTS
Basophil # (HT): 0 10 3/uL (ref 0.0–0.2)
Basophil % (HT): 1 % (ref 0–3)
Eosinophil # (HT): 0.5 10 3/uL (ref 0.0–0.6)
Eosinophil % (HT): 8 % — ABNORMAL HIGH (ref 0–5)
HBV Core Ab (HT): NEGATIVE
HBV S Ag (HT): NEGATIVE
HIV 1&2 ANTIGEN/ANTIBODY (HT): NONREACTIVE
Hematocrit (HT): 38 % (ref 35–47)
Hemoglobin (HGB) (HT): 12.3 g/dL (ref 12.0–16.0)
Hep C Ab: NEGATIVE
Lymphocyte # (HT): 2.4 10 3/uL (ref 1.0–4.8)
Lymphocyte % (HT): 41 % (ref 15–45)
MCHC (HT): 32.3 g/dL (ref 31.0–37.5)
MCV (HT): 97 fL (ref 80–100)
Mean Corpuscular Hemoglobin (MCH) (HT): 31.2 pg (ref 26.0–34.0)
Monocyte # (HT): 0.5 10 3/uL (ref 0.1–1.0)
Monocyte % (HT): 9 % (ref 0–15)
Neutrophil # (HT): 2.4 10 3/uL (ref 1.8–8.0)
Platelets (HT): 250 10 3/uL (ref 150–450)
RBC (HT): 3.94 10 6/uL (ref 3.80–5.20)
RDW (HT): 12.6 % (ref 0.0–15.2)
Seg Neut % (HT): 41 % — ABNORMAL LOW (ref 45–75)
WBC (HT): 5.8 10 3/uL (ref 4.0–11.0)

## 2024-08-11 LAB — TREPONEMA PALLIDUM (SYPHILIS) ANTIBODY (HT): Syphilis Treponemal Ab (HT): NONREACTIVE

## 2024-08-12 ENCOUNTER — Telehealth: Payer: Self-pay
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
## 2024-08-23 ENCOUNTER — Telehealth: Payer: Self-pay

## 2024-08-23 NOTE — Telephone Encounter (Signed)
 TC from [name] asking if she can be seen by KANDICE Piedmont NP before Tues 9/2.At this time there are no available appts with this provider sooner than Pt's scheduled appt.Pt will keep original appt

## 2024-08-30 ENCOUNTER — Other Ambulatory Visit
Admission: RE | Admit: 2024-08-30 | Discharge: 2024-08-30 | Disposition: A | Discharge status: Home / Self Care | Payer: PRIVATE HEALTH INSURANCE | Source: Ambulatory Visit | Attending: Obstetrics and Gynecology | Admitting: Obstetrics and Gynecology

## 2024-08-30 ENCOUNTER — Encounter: Payer: Self-pay | Admitting: Obstetrics and Gynecology

## 2024-08-30 ENCOUNTER — Ambulatory Visit: Payer: PRIVATE HEALTH INSURANCE | Attending: Obstetrics and Gynecology | Admitting: Obstetrics and Gynecology

## 2024-08-30 ENCOUNTER — Other Ambulatory Visit: Payer: Self-pay

## 2024-08-30 DIAGNOSIS — N898 Other specified noninflammatory disorders of vagina: Secondary | ICD-10-CM

## 2024-08-30 DIAGNOSIS — Z3009 Encounter for other general counseling and advice on contraception: Secondary | ICD-10-CM

## 2024-08-30 DIAGNOSIS — Z113 Encounter for screening for infections with a predominantly sexual mode of transmission: Secondary | ICD-10-CM | POA: Insufficient documentation

## 2024-08-30 LAB — N. GONORRHOEAE NAAT (PCR)
N. gonorrhoeae NAAT (PCR): NEGATIVE
N. gonorrhoeae NAAT (PCR): NEGATIVE
N. gonorrhoeae NAAT (PCR): NEGATIVE

## 2024-08-30 LAB — CHLAMYDIA NAAT (PCR)
Chlamydia NAAT (PCR): NEGATIVE
Chlamydia NAAT (PCR): NEGATIVE
Chlamydia NAAT (PCR): NEGATIVE

## 2024-08-30 LAB — TRICHOMONAS NAAT: Trichomonas NAAT: NEGATIVE

## 2024-08-30 LAB — POCT URINE PREGNANCY
Lot #: 234688
Preg Test,UR POC: NEGATIVE

## 2024-08-30 MED ORDER — TRIAMCINOLONE ACETONIDE 0.1 % EX OINT *I*
TOPICAL_OINTMENT | Freq: Two times a day (BID) | CUTANEOUS | 1 refills | Status: AC
Start: 2024-08-30 — End: ?

## 2024-08-30 MED ORDER — MUPIROCIN 2 % EX OINT *I*
TOPICAL_OINTMENT | Freq: Three times a day (TID) | CUTANEOUS | 1 refills | Status: AC
Start: 2024-08-30 — End: ?
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
## 2024-08-30 NOTE — Progress Notes (Signed)
 08/30/24 1352 Demographics Service Location [facility] GYN OBGYN Provider [facility] Risk Factors Risk Factors Behavioral health issues Referrals & Resources Provided Referrals & Resources Provided Other (comment)(Care Coordination) Time Spent Time Spent with Patient (min) 30 NP Meade [place] inquired if SW was aware if pt was linked with a PCP.  Per chart review, SW Niels Ball spoke with pt's [facility] case manager Maurilio [phone] last month and that she was working on that connection.  SW reached out to [name], reports that Ms. Saia is connected to [facility] services through [facility], assigned worker is Joelle Rooney [phone]. Ms. Alverda reports that Ms. Magana has a PCp appt with Dr. Donnice Daring at [facility] on 10/04/24 at 2:00pm. Ms. Alverda also shared that pt is linked with [facility], she does not have her therapist's name but knows pt is unwiling to take recommended medications for her mental health.   Ms. Alverda stated that she speaks to pt weekly, every Friday. Plan:  Updated NP Meade, SW to remain available.  Rosaline Cable, LMSWOB/GYN Social Worker

## 2024-08-31 LAB — HIV-1/2  ANTIGEN/ANTIBODY SCREEN WITH CONFIRMATION: HIV 1&2 ANTIGEN/ANTIBODY: NONREACTIVE

## 2024-08-31 LAB — SYPHILIS SCREEN
Syphilis Screen: NEGATIVE
Syphilis Status: NONREACTIVE

## 2024-08-31 LAB — HEPATITIS C VIRUS ANTIBODY WITH REFLEX TO HEPATITIS C QUANTITATIVE: Hep C Ab: NEGATIVE

## 2024-08-31 LAB — HEPATITIS B SURFACE ANTIGEN: HBV S Ag: NEGATIVE

## 2024-09-01 ENCOUNTER — Ambulatory Visit: Payer: Self-pay | Admitting: Obstetrics and Gynecology

## 2024-09-06 ENCOUNTER — Ambulatory Visit: Payer: PRIVATE HEALTH INSURANCE | Attending: Obstetrics and Gynecology | Admitting: Obstetrics and Gynecology

## 2024-09-06 ENCOUNTER — Encounter: Payer: Self-pay | Admitting: Obstetrics and Gynecology

## 2024-09-06 ENCOUNTER — Other Ambulatory Visit: Payer: Self-pay

## 2024-09-06 VITALS — BP 124/73 | HR 100 | Wt 177.0 lb

## 2024-09-06 DIAGNOSIS — F99 Mental disorder, not otherwise specified: Secondary | ICD-10-CM

## 2024-09-06 DIAGNOSIS — Z3009 Encounter for other general counseling and advice on contraception: Secondary | ICD-10-CM

## 2024-09-06 DIAGNOSIS — Z708 Other sex counseling: Secondary | ICD-10-CM

## 2024-09-06 NOTE — Progress Notes (Addendum)
 GENDER WELLNESS OB/GYNSUBJECTIVE Tashya Odden is a 33 y.o. female here for STI test results.YEP:Wphzmpj is a 33 year old G78 P2-1-1-2 female who presents for STI test results.  Patient requested in person results for STI screening.  She had oral anal and cervical swabs obtained.  She reports having trouble with MyChart and prefers in office results review.During visit patient was on the phone with a female friend who she wanted to hear her results as well.  She gave permission for this person to be present during her review.  She denies any new concerns for her overall health.She shared with provider that her financial situation is poor.  That she may have to start selling her body for financial reasons.  She is currently not on a contraceptive method outside of condom use.  She is not interested in contraceptive options at this time.  She reports if she were to get pregnant she would give the baby away.  She reports being followed by mental health providers and currently taking Latuda.  She request to have social work contact her for any other options to assist with her financial needs and stresses.  Of note she does have a futures trader who works with her as well. Gynecologic History:Patient's last menstrual period was 08/30/2024 (exact date). Contraception: Condom useOB History   Gravida 4  Para 3  Term 2  Preterm 1  AB 1  Living 2   SAB 0  IAB 0  Ectopic 1  Multiple 0  Live Births 3     Patient Active Problem List Diagnosis Code  Paranoia F22  PTSD (post-traumatic stress disorder) F43.10  Substance use disorder F19.90  Unhoused Z59.00  Asthma during pregnancy O99.519, J45.909  Staph aureus infection A49.01  Suicidal ideation R45.851  History of gestational diabetes Z86.32  Abnormal Pap smear of cervix R87.619 MND:Rnwdupulupnwjo, cardiovascular, respiratory, GI and GU systems negative except  for HPIOBJECTIVE Chaperone was present in the exam room at the time the pelvic exam was performed.Utilizing a shared decision model, a pelvic exam was performed today as indicated by screening recommendations, medical history and/or symptoms. BP 124/73   Pulse 100   Wt 80.3 kg (177 lb)   LMP 08/30/2024 (Exact Date)   BMI (P) 27.72 kg/m  MENTAL STATUS: Alert, normal MS. Answers all questions appropriately.GENERAL: 33 y.o. year y/o female in NAD. Appropriately groomed. + eye contact. Pleasant. LUNGS: Respirations unlabored.HEART: Regular rate. Latest Reference Range & Units 08/30/24 09:20 08/30/24 09:28 08/30/24 10:13 08/30/24 15:11 Syphilis Status Nonreactive   Nonreact   Syphilis Screen   Neg   HBV S Ag NEG   NEG   Hep C Ab NEG   NEG   HIV 1&2 ANTIGEN/ANTIBODY Nonreactive   Nonreactive   Chlamydia Source    Throat Rectal/Anal Chlamydia NAAT (PCR) Negative    NEGATIVE NEGATIVE N. gonorrhoeae source    Throat Rectal/anal N. gonorrhoeae NAAT (PCR) Negative    NEGATIVE NEGATIVE Preg Test,UR POC Negative-Dilute urine specimens may cause false negative urine pregnancy results...  Negative     Latest Reference Range & Units 08/30/24 09:06 Chlamydia Source  Cervix Chlamydia NAAT (PCR) Negative  NEGATIVE N. gonorrhoeae source  Cervix N. gonorrhoeae NAAT (PCR) Negative  NEGATIVE Trichomonas Source  Cervix Trichomonas NAAT Negative  NEGATIVE ASSESSMENT AND PLAN STI screening - STI prevention and condom use reviewed.  Patient declined condom samples with visit.-Reviewed concerns for high risk sexual activity and behaviors including STIS and personal safety.- Reviewed PrEP and patient may be interested.  She  may want IV PrEP and accepts referral to [facility] ID service.- Reviewed postexposure PEP for as needed use until she is able to establish care for PrEPContraception- Reviewed contraceptive options written information was provided.- Patient  declines options outside of condom use.- High risk nature of pregnancy reviewed related to psychosocial issues.- EC reviewed and patient declines a prescription.Psychosocial concerns/mental health ds-Presents in NAD. - Contacted social work to meet with patient.  Patient stated she could not wait to meet and prefers a phone call from the social work staff.  Sarah Galligan, social work will contact patient and is aware of patient's link to care manager.-Declined [facility] food pantry as in option.- Encouraged to continue care with [facility] mental health providersPatient able to teach back information given to her today.RTC for AGY or prn.

## 2024-09-07 ENCOUNTER — Telehealth: Payer: Self-pay | Admitting: Infectious Disease

## 2024-09-07 NOTE — Telephone Encounter (Signed)
 Patient is not established with clinic for Apretude yet. Patient insurance name: [employer] MEDICAID   Policy number: [id number] (Primary is [place] OFFICE OF MENTAL HEALTH which I don't think applies to this benefits investigation?)MAS please do benefit investigation for Apretude and route back to sender with outcome of investigation.If benefits for coverage cannot be verified, MAS please indicate result of benefit investigation. If Prior Auth is needed and can be done without chart notes or labs- please submit for prior auth under dx of Contact with (suspected) HIV. If prior auth approved or if covered without prior auth, please advise. Same start ok. Scheduler to call patient to review prior auth details and confirm they would like to schedule.  If prior auth still pending or denied- please advise. Patient can be scheduled for PrEP visit but it needs to be documented that same day start not advised.

## 2024-09-07 NOTE — Telephone Encounter (Signed)
 Unable to leave voicemail to schedule, mail box full. MyChart message has been sent with ID phone number. OK to schedule with HIV APP at pt's convenience.

## 2024-09-07 NOTE — Telephone Encounter (Signed)
 Benefit investigation done for Apretude. Patient is covered: Yes   Mode of acquisition: Buy/Bill- CLINIC SUPPLIED MED  Does the patient need prior auth?No  Prior auth submitted: N/A  Prior auth approved: N/ADoes patient have deductible: No  Was MAS able to enroll patient for copay card:Not required- Covered in Full     For patient's with Medicare: MAS will contact patient to review that there is potential for out-of-pocket expenses. Do they want to proceed with treatment? N/A                                    Does patient need to pursue free drug program? no    (If yes, follow work flow for pursuing free drug and notify nursing/provider when patient is ready to start). MAS note to provider: per portal, NO PA required, No Ded, NO OOPM, Covered in Full. OKAY to [place] and [employer]

## 2024-09-09 NOTE — Telephone Encounter (Unsigned)
 Copied from CRM #[id number]. Topic: Access to Care - Speak to Provider/Office Staff>> Sep 09, 2024  4:15 PM Alexis Vang wrote:Patient returning call to schedule but writer is unable to schedule these appointments, and was also unable to transfer the caller. [name] can be reached at [phone] (home)

## 2024-09-12 NOTE — Telephone Encounter (Signed)
 Writer spoke with pt to schedule. Pt could only schedule for tomorrow, 09/13/24. Pt scheduled within an open NPV spot with provider Laneta Fly, NP tomorrow, 09/13/24 at 9:30am.

## 2024-09-13 ENCOUNTER — Ambulatory Visit: Payer: Self-pay

## 2024-09-13 ENCOUNTER — Ambulatory Visit: Payer: Self-pay | Admitting: Infectious Diseases

## 2024-09-13 NOTE — Telephone Encounter (Signed)
 Correct, pt no showed NPV 09/13/24. Writer will begin re-contacting to attempt to reschedule.

## 2024-09-16 NOTE — Telephone Encounter (Signed)
 TC from patient and care manager to review test results from 08/30/24. Reviewed all labs were negative. Care manager verified patient instructions were return for agy and for prn. Advised to call the office with any questions or concerns. Pt understands and agrees with the plan of care.

## 2024-09-21 NOTE — Telephone Encounter (Signed)
 Unable to leave voicemail to reschedule. OK to reschedule with HIV APP at pt's convenience.

## 2024-09-23 NOTE — Telephone Encounter (Signed)
 Unable to leave voicemail to reschedule. OK to reschedule with HIV APP at pt's convenience.

## 2024-09-27 NOTE — Telephone Encounter (Signed)
 Writing closing referral. If pt calls back to reschedule a new benefits investigation must be completed. If pt calls back, please schedule in 2 weeks with HIV APP and route to REF COORDINATOR POOL.

## 2024-10-07 ENCOUNTER — Encounter: Payer: Self-pay | Admitting: Internal Medicine

## 2024-10-20 ENCOUNTER — Other Ambulatory Visit: Payer: Self-pay

## 2024-10-20 ENCOUNTER — Emergency Department
Admission: EM | Admit: 2024-10-20 | Discharge: 2024-10-20 | Disposition: A | Discharge status: Home / Self Care | Source: Ambulatory Visit | Attending: Student in an Organized Health Care Education/Training Program | Admitting: Student in an Organized Health Care Education/Training Program

## 2024-10-20 DIAGNOSIS — T7421XA Adult sexual abuse, confirmed, initial encounter: Secondary | ICD-10-CM

## 2024-10-20 DIAGNOSIS — T7621XA Adult sexual abuse, suspected, initial encounter: Secondary | ICD-10-CM | POA: Insufficient documentation

## 2024-10-20 DIAGNOSIS — X58XXXA Exposure to other specified factors, initial encounter: Secondary | ICD-10-CM | POA: Insufficient documentation

## 2024-10-20 DIAGNOSIS — Y9289 Other specified places as the place of occurrence of the external cause: Secondary | ICD-10-CM | POA: Insufficient documentation

## 2024-10-20 LAB — CHLAMYDIA NAAT (PCR)
Chlamydia NAAT (PCR): NEGATIVE
Chlamydia NAAT (PCR): NEGATIVE
Chlamydia NAAT (PCR): NEGATIVE
Chlamydia NAAT (PCR): NEGATIVE
Chlamydia NAAT (PCR): NEGATIVE

## 2024-10-20 LAB — BASIC METABOLIC PANEL
Anion Gap: 8 (ref 7–16)
CO2: 25 mmol/L (ref 20–28)
Calcium: 9.6 mg/dL (ref 8.8–10.2)
Chloride: 103 mmol/L (ref 96–108)
Creatinine: 0.75 mg/dL (ref 0.51–0.95)
Glucose: 83 mg/dL (ref 60–99)
Lab: 20 mg/dL (ref 6–20)
Potassium: 4.8 mmol/L (ref 3.3–5.1)
Sodium: 136 mmol/L (ref 133–145)
eGFR BY CREAT: 107

## 2024-10-20 LAB — N. GONORRHOEAE NAAT (PCR)
N. gonorrhoeae NAAT (PCR): NEGATIVE
N. gonorrhoeae NAAT (PCR): NEGATIVE
N. gonorrhoeae NAAT (PCR): NEGATIVE
N. gonorrhoeae NAAT (PCR): NEGATIVE
N. gonorrhoeae NAAT (PCR): NEGATIVE

## 2024-10-20 LAB — VAGINITIS SCREEN: DNA PROBE: Vaginitis Screen:DNA Probe: 0

## 2024-10-20 LAB — COMPREHENSIVE METABOLIC PANEL
ALT: 9 U/L (ref 0–35)
AST: 17 U/L (ref 0–35)
Albumin: 4.1 g/dL (ref 3.5–5.2)
Alk Phos: 67 U/L (ref 35–105)
Anion Gap: 8 (ref 7–16)
Bilirubin,Total: 0.6 mg/dL (ref 0.0–1.2)
CO2: 25 mmol/L (ref 20–28)
Calcium: 9.6 mg/dL (ref 8.8–10.2)
Chloride: 103 mmol/L (ref 96–108)
Creatinine: 0.75 mg/dL (ref 0.51–0.95)
Glucose: 83 mg/dL (ref 60–99)
Lab: 20 mg/dL (ref 6–20)
Potassium: 4.8 mmol/L (ref 3.3–5.1)
Sodium: 136 mmol/L (ref 133–145)
Total Protein: 7.3 g/dL (ref 6.3–7.7)
eGFR BY CREAT: 107

## 2024-10-20 LAB — PREGNANCY TEST, SERUM: Preg,Serum: NEGATIVE

## 2024-10-20 MED ORDER — CEFTRIAXONE SODIUM 500 MG IJ SOLR *I*
500.0000 mg | Freq: Once | INTRAMUSCULAR | Status: AC
Start: 2024-10-20 — End: 2024-10-20
  Administered 2024-10-20: 500 mg via INTRAMUSCULAR
  Filled 2024-10-20: qty 5

## 2024-10-20 MED ORDER — (HIV PEP KIT) DOLUTEGRAVIR SODIUM 50 MG PO TABS *I*
50.0000 mg | ORAL_TABLET | Freq: Every day | ORAL | Status: DC
Start: 2024-10-20 — End: 2024-10-20
  Administered 2024-10-20: 50 mg via ORAL
  Filled 2024-10-20: qty 1

## 2024-10-20 MED ORDER — DEXTROSE 5 % FLUSH FOR PUMPS *I*
0.0000 mL/h | INTRAVENOUS | Status: DC | PRN
Start: 2024-10-20 — End: 2024-10-20

## 2024-10-20 MED ORDER — SODIUM CHLORIDE 0.9 % FLUSH FOR PUMPS *I*
0.0000 mL/h | INTRAVENOUS | Status: DC | PRN
Start: 2024-10-20 — End: 2024-10-20

## 2024-10-20 MED ORDER — AZITHROMYCIN 250 MG PO TABS *I*
1000.0000 mg | ORAL_TABLET | Freq: Once | ORAL | Status: AC
Start: 2024-10-20 — End: 2024-10-20
  Administered 2024-10-20: 1000 mg via ORAL
  Filled 2024-10-20: qty 4

## 2024-10-20 MED ORDER — (HIV PEP KIT) EMTRICITABINE-TENOFOVIR 200-300 MG PO TABS *I*
1.0000 | ORAL_TABLET | Freq: Every day | ORAL | Status: DC
Start: 2024-10-20 — End: 2024-10-20
  Administered 2024-10-20: 1 via ORAL
  Filled 2024-10-20: qty 1

## 2024-10-20 MED ORDER — HEPATITIS B VAC RECOMBINANT 20 MCG/ML IJ VIAL/SYR *WRAPPED*
20.0000 ug | Freq: Once | INTRAMUSCULAR | Status: AC
Start: 2024-10-20 — End: 2024-10-20
  Administered 2024-10-20: 20 ug via INTRAMUSCULAR
  Filled 2024-10-20 (×3): qty 1

## 2024-10-20 MED ORDER — ULIPRISTAL ACETATE 30 MG PO TABS *I*
30.0000 mg | ORAL_TABLET | Freq: Once | ORAL | Status: AC
Start: 2024-10-20 — End: 2024-10-20
  Administered 2024-10-20: 30 mg via ORAL
  Filled 2024-10-20: qty 1

## 2024-10-20 MED ORDER — METRONIDAZOLE 250 MG PO TABS *I*
2000.0000 mg | ORAL_TABLET | Freq: Once | ORAL | Status: AC
Start: 2024-10-20 — End: 2024-10-20
  Administered 2024-10-20: 2000 mg via ORAL
  Filled 2024-10-20: qty 8
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
## 2024-10-21 LAB — HEPATITIS B & C PROF
HBV Core Ab: NEGATIVE
HBV S Ab Quant: 289.86 m[IU]/mL
HBV S Ab: POSITIVE
HBV S Ag: NEGATIVE
Hep C Ab: NEGATIVE

## 2024-10-21 LAB — SYPHILIS SCREEN
Syphilis Screen: NEGATIVE
Syphilis Status: NONREACTIVE

## 2024-10-21 LAB — HIV-1/2  ANTIGEN/ANTIBODY SCREEN WITH CONFIRMATION: HIV 1&2 ANTIGEN/ANTIBODY: NONREACTIVE

## 2024-10-24 ENCOUNTER — Encounter: Payer: Self-pay | Admitting: Obstetrics and Gynecology

## 2024-10-24 ENCOUNTER — Other Ambulatory Visit: Payer: Self-pay

## 2024-10-24 ENCOUNTER — Ambulatory Visit: Payer: Self-pay | Admitting: Obstetrics and Gynecology

## 2024-10-24 ENCOUNTER — Other Ambulatory Visit
Admission: RE | Admit: 2024-10-24 | Discharge: 2024-10-24 | Disposition: A | Discharge status: Home / Self Care | Payer: PRIVATE HEALTH INSURANCE | Source: Ambulatory Visit | Attending: Obstetrics and Gynecology | Admitting: Obstetrics and Gynecology

## 2024-10-24 ENCOUNTER — Ambulatory Visit: Payer: PRIVATE HEALTH INSURANCE | Attending: Obstetrics and Gynecology | Admitting: Obstetrics and Gynecology

## 2024-10-24 VITALS — BP 132/58 | HR 66 | Ht 63.0 in | Wt 188.6 lb

## 2024-10-24 DIAGNOSIS — Z5941 Food insecurity: Secondary | ICD-10-CM

## 2024-10-24 DIAGNOSIS — N898 Other specified noninflammatory disorders of vagina: Secondary | ICD-10-CM

## 2024-10-24 DIAGNOSIS — Z113 Encounter for screening for infections with a predominantly sexual mode of transmission: Secondary | ICD-10-CM

## 2024-10-24 LAB — VAGINITIS SCREEN: DNA PROBE: Vaginitis Screen:DNA Probe: 0

## 2024-10-24 LAB — TRICHOMONAS NAAT: Trichomonas NAAT: NEGATIVE

## 2024-10-24 LAB — N. GONORRHOEAE NAAT (PCR): N. gonorrhoeae NAAT (PCR): NEGATIVE

## 2024-10-24 LAB — CHLAMYDIA NAAT (PCR): Chlamydia NAAT (PCR): NEGATIVE
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
## 2024-10-24 NOTE — Progress Notes (Addendum)
 10/24/24 1338 Demographics [place] Service [facility] Provider [facility] Risk Factors Risk Factors Food issues Referrals & Resources Provided Referrals & Resources Provided Food resources Time Spent Time Spent with Patient (min) 15 SW received a message from NP [employer], Pt interested in food cupboard. SW called Pt right away, as she was going to the lab, no answer, VM full.SW sent a detailed Mychart message so Pt can access local food cupboards- of note, Pt is also actively involved with a care manger who sees her weekly. ( Per last SW note)Plan- as above, Mychart message sent.Niels Ball, LMSW, [id number]- Pt came back to [facility]- clinical research associate provided 1 unit of food from [employer], and a written listing of food cupboards. NP Meade Piedmont updated.

## 2024-10-25 ENCOUNTER — Telehealth: Payer: Self-pay

## 2024-10-25 ENCOUNTER — Ambulatory Visit: Payer: PRIVATE HEALTH INSURANCE

## 2024-10-25 LAB — HEPATITIS C VIRUS ANTIBODY WITH REFLEX TO HEPATITIS C QUANTITATIVE: Hep C Ab: NEGATIVE

## 2024-10-25 LAB — HIV-1/2  ANTIGEN/ANTIBODY SCREEN WITH CONFIRMATION: HIV 1&2 ANTIGEN/ANTIBODY: NONREACTIVE

## 2024-10-25 LAB — SYPHILIS SCREEN
Syphilis Screen: NEGATIVE
Syphilis Status: NONREACTIVE

## 2024-10-25 LAB — HEPATITIS B SURFACE ANTIGEN: HBV S Ag: NEGATIVE

## 2024-10-25 NOTE — Progress Notes (Signed)
 Picked up/delivered

## 2024-10-25 NOTE — Telephone Encounter (Signed)
 VM still fullWriter sent text requesting direct call to writer.

## 2024-10-25 NOTE — Telephone Encounter (Signed)
 10/28 8:02 outgoing call made to patient no answer voicemail full could not accept any messages at this time. Upon return call to office please schedule video visit slot with Np Danette Gress.

## 2024-10-26 ENCOUNTER — Ambulatory Visit: Payer: Self-pay | Admitting: Obstetrics and Gynecology
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
## 2024-10-27 ENCOUNTER — Telehealth: Payer: Self-pay

## 2024-10-27 ENCOUNTER — Ambulatory Visit: Payer: Self-pay

## 2024-10-27 NOTE — Telephone Encounter (Signed)
 Patient called to reschedule 10-26-24 no showed appt.  The patient agreed to schedule a FUV on 11/07/24 at 1:45p with Np Meade, F.

## 2024-10-27 NOTE — Telephone Encounter (Signed)
'    Sent reminder text.

## 2024-10-31 ENCOUNTER — Emergency Department
Admission: EM | Admit: 2024-10-31 | Discharge: 2024-11-01 | Disposition: A | Discharge status: Home / Self Care | Source: Ambulatory Visit | Attending: Emergency Medicine | Admitting: Emergency Medicine

## 2024-10-31 DIAGNOSIS — Y9289 Other specified places as the place of occurrence of the external cause: Secondary | ICD-10-CM | POA: Insufficient documentation

## 2024-10-31 DIAGNOSIS — N939 Abnormal uterine and vaginal bleeding, unspecified: Secondary | ICD-10-CM

## 2024-10-31 DIAGNOSIS — T7621XA Adult sexual abuse, suspected, initial encounter: Secondary | ICD-10-CM | POA: Insufficient documentation

## 2024-10-31 DIAGNOSIS — T7421XA Adult sexual abuse, confirmed, initial encounter: Secondary | ICD-10-CM

## 2024-10-31 DIAGNOSIS — X58XXXA Exposure to other specified factors, initial encounter: Secondary | ICD-10-CM | POA: Insufficient documentation
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
## 2024-11-07 ENCOUNTER — Telehealth: Payer: Self-pay

## 2024-11-07 ENCOUNTER — Ambulatory Visit: Payer: PRIVATE HEALTH INSURANCE | Attending: Obstetrics and Gynecology | Admitting: Obstetrics and Gynecology

## 2024-11-07 ENCOUNTER — Other Ambulatory Visit
Admission: RE | Admit: 2024-11-07 | Discharge: 2024-11-07 | Disposition: A | Discharge status: Home / Self Care | Payer: PRIVATE HEALTH INSURANCE | Source: Ambulatory Visit | Attending: Obstetrics and Gynecology | Admitting: Obstetrics and Gynecology

## 2024-11-07 ENCOUNTER — Encounter: Payer: Self-pay | Admitting: Obstetrics and Gynecology

## 2024-11-07 ENCOUNTER — Other Ambulatory Visit: Payer: Self-pay

## 2024-11-07 VITALS — BP 128/69 | HR 83 | Ht 64.0 in | Wt 198.4 lb

## 2024-11-07 DIAGNOSIS — Z3009 Encounter for other general counseling and advice on contraception: Secondary | ICD-10-CM | POA: Insufficient documentation

## 2024-11-07 DIAGNOSIS — Z113 Encounter for screening for infections with a predominantly sexual mode of transmission: Secondary | ICD-10-CM | POA: Insufficient documentation
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
## 2024-11-07 NOTE — Telephone Encounter (Signed)
 TC from pt seen in the office today. Pt wonders about the results of the testing performed. Discussed the labs will take some more time to result and the office will be in touch with final results. Pt verbalizes understanding.

## 2024-11-08 ENCOUNTER — Ambulatory Visit: Payer: Self-pay | Admitting: Obstetrics and Gynecology

## 2024-11-08 LAB — SYPHILIS SCREEN
Syphilis Screen: NEGATIVE
Syphilis Status: NONREACTIVE

## 2024-11-08 LAB — TRICHOMONAS NAAT: Trichomonas NAAT: NEGATIVE

## 2024-11-08 LAB — CHLAMYDIA NAAT (PCR)
Chlamydia NAAT (PCR): NEGATIVE
Chlamydia NAAT (PCR): NEGATIVE
Chlamydia NAAT (PCR): NEGATIVE

## 2024-11-08 LAB — N. GONORRHOEAE NAAT (PCR)
N. gonorrhoeae NAAT (PCR): NEGATIVE
N. gonorrhoeae NAAT (PCR): NEGATIVE
N. gonorrhoeae NAAT (PCR): NEGATIVE

## 2024-11-08 LAB — HIV-1/2  ANTIGEN/ANTIBODY SCREEN WITH CONFIRMATION: HIV 1&2 ANTIGEN/ANTIBODY: NONREACTIVE

## 2024-11-08 LAB — HEPATITIS C VIRUS ANTIBODY WITH REFLEX TO HEPATITIS C QUANTITATIVE: Hep C Ab: NEGATIVE

## 2024-11-08 LAB — HEPATITIS B SURFACE ANTIGEN: HBV S Ag: NEGATIVE

## 2024-11-08 LAB — PREGNANCY TEST, SERUM: Preg,Serum: NEGATIVE

## 2024-11-10 ENCOUNTER — Telehealth: Payer: Self-pay

## 2024-11-10 NOTE — Telephone Encounter (Addendum)
 Called patient to review STI test results.  Used pass code phrase she provided to insure the caller was her provider. Inform patient that her anal cervical and throat swabs for gonorrhea and chlamydia were negative.  Informed her trichomoniasis screening from the cervix was negative.  Informed her that HIV syphilis and hepatitis B and C were negative.  She states understanding of this information.  Safe sex practices were reviewed.  No other concerns were addressed identified.  And patient was satisfied with the results and the encounter.

## 2024-11-10 NOTE — Telephone Encounter (Signed)
 TC from patient requesting to speak with Meade personally and wanting to review her test results. Reviewed test results for STD testing were negative. Pt requesting Meade call her back, did not want to give any further information. Will send message to provider.

## 2024-11-30 ENCOUNTER — Telehealth: Payer: Self-pay

## 2024-11-30 ENCOUNTER — Ambulatory Visit: Payer: Self-pay | Admitting: Obstetrics and Gynecology
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
## 2024-12-01 NOTE — Telephone Encounter (Addendum)
 TC to pt to review her missed connection with infectious disease. Pt was instructed to reach out to ID if she feels she needs to start PREP ASAP. Pt verbalizes understanding, accepts their contact number and states she will follow up with them. CB encouraged PRN, pt to present toi [facility] 12/23.

## 2024-12-13 ENCOUNTER — Encounter: Payer: Self-pay | Admitting: Physician Assistant

## 2024-12-13 ENCOUNTER — Ambulatory Visit: Attending: Physician Assistant | Admitting: Physician Assistant

## 2024-12-13 ENCOUNTER — Other Ambulatory Visit: Payer: Self-pay

## 2024-12-13 VITALS — BP 127/95 | HR 62 | Temp 98.4°F | Resp 18

## 2024-12-13 DIAGNOSIS — Z3202 Encounter for pregnancy test, result negative: Secondary | ICD-10-CM | POA: Insufficient documentation

## 2024-12-13 DIAGNOSIS — Z113 Encounter for screening for infections with a predominantly sexual mode of transmission: Secondary | ICD-10-CM | POA: Insufficient documentation

## 2024-12-13 DIAGNOSIS — N898 Other specified noninflammatory disorders of vagina: Secondary | ICD-10-CM | POA: Insufficient documentation

## 2024-12-13 DIAGNOSIS — N3000 Acute cystitis without hematuria: Secondary | ICD-10-CM

## 2024-12-13 DIAGNOSIS — S01131A Puncture wound without foreign body of right eyelid and periocular area, initial encounter: Secondary | ICD-10-CM

## 2024-12-13 LAB — POCT URINALYSIS DIPSTICK
Bilirubin,Ur: NEGATIVE
Blood,UA POCT: NEGATIVE
Glucose,UA POCT: NORMAL mg/dL
Ketones,UA POCT: NEGATIVE mg/dL
Lot #: 83627103
Nitrite,UA POCT: POSITIVE — AB
PH,UA POCT: 5 (ref 5–8)
Specific gravity,UA POCT: 1.03 (ref 1.002–1.030)
Urobilinogen,UA: NORMAL mg/dL

## 2024-12-13 LAB — POCT URINE PREGNANCY
Lot #: 241074
Preg Test,UR POC: NEGATIVE

## 2024-12-13 MED ORDER — NITROFURANTOIN MONOHYD MACRO 100 MG PO CAPS *I*: 100.0000 mg | ORAL_CAPSULE | Freq: Two times a day (BID) | ORAL | 0 refills | Status: AC

## 2024-12-13 NOTE — Patient Instructions (Addendum)
 [email or web address] for mental health services.We have obtained STD testing while in the office today and it will be sent to the lab for final result. Results take about 2-3 days.  You will only be called for positive resultsPlease use condoms with every sexual encounter to prevent any STDs. Speak with your partner(s) about being checked for STDs especially if you are having symptomsPlease abstain from sex until your symptoms have resolved.A positive result for STD will need to be reported to the health department ____________________________________________________________________________________________Hydrate to keep your urine a clear or pale yellow color. Can add cranberry juice to help clear the infection.   Tylenol  can help with pain. Take every 4-6 hours as needed  OTC AZO can help with the discomfort while urinating  Avoid bladder irritants such as coffee, tea, carbonated drinks.  Empty your bladder frequently and do not hold urine.  For women please wipe from front to back, and get a new piece of tissue every time you wipe  Please complete the full antibiotic course as prescribed (even if you start to feel better)  Please monitor for any worsening pain radiating to flank area, fevers, chills, or vomiting and go to Emergency Department  Antibiotics can cause some GI upset, please take with food to help prevent that.  Please eat yogurt daily or take an oral probiotic while on antibiotics to help regulate your bacteria in your GI tract and prevent developing c-diff.

## 2024-12-13 NOTE — Progress Notes (Signed)
 Discharge instructions reviewed with patient and caregiver.  Verbalizes understanding.  All belongings with Pt.  No questions or concerns at this time.
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
## 2024-12-13 NOTE — Procedures (Signed)
 Foreign body removalDate/Time: 12/13/2024 4:45 PMPerformed by: Legrand Bound, PAAuthorized by: Legrand Bound, PA  Consent:   Consent obtained:  Verbal  Consent given by:  Patient  Risks, benefits, and alternatives were discussed: yes    Risks discussed:  Bleeding, infection, pain, worsening of condition, poor cosmetic result and incomplete removalUniversal protocol:   Procedure explained and questions answered to patient or proxy's satisfaction: yes  Location:   Location: right eyebrow.  Depth:  IntradermalPre-procedure details:   Imaging:  None  Neurovascular status: intact  Anesthesia:   Anesthesia method:  NoneProcedure type:   Procedure complexity:  SimpleProcedure details:   Localization method:  Visualized  Dissection of underlying tissues: no    Removal mechanism:  Hemostat  Foreign bodies recovered:  1  Description:  Eyebrow piercing removed  Intact foreign body removal: yes  Post-procedure details:   Neurovascular status: intact    Confirmation:  No additional foreign bodies on visualization  Skin closure:  None  Procedure completion:  Tolerated well, no immediate complications

## 2024-12-14 ENCOUNTER — Ambulatory Visit: Payer: Self-pay | Admitting: Emergency Medicine

## 2024-12-14 LAB — N. GONORRHOEAE NAAT (PCR)
N. gonorrhoeae NAAT (PCR): NEGATIVE
N. gonorrhoeae NAAT (PCR): NEGATIVE
N. gonorrhoeae NAAT (PCR): NEGATIVE
N. gonorrhoeae NAAT (PCR): NEGATIVE

## 2024-12-14 LAB — SYPHILIS SCREEN
Syphilis Screen: NEGATIVE
Syphilis Status: NONREACTIVE

## 2024-12-14 LAB — HEPATITIS B & C PROF
HBV Core Ab: NEGATIVE
HBV S Ab Quant: >1000 m[IU]/mL
HBV S Ab: POSITIVE
HBV S Ag: NEGATIVE
Hep C Ab: NEGATIVE

## 2024-12-14 LAB — CHLAMYDIA NAAT (PCR)
Chlamydia NAAT (PCR): NEGATIVE
Chlamydia NAAT (PCR): NEGATIVE
Chlamydia NAAT (PCR): NEGATIVE

## 2024-12-14 LAB — HIV-1/2  ANTIGEN/ANTIBODY SCREEN WITH CONFIRMATION: HIV 1&2 ANTIGEN/ANTIBODY: NONREACTIVE

## 2024-12-14 LAB — AEROBIC BACTERIAL URINE CULTURE: Aerobic bacterial urine culture: 0

## 2024-12-14 LAB — TRICHOMONAS NAAT: Trichomonas NAAT: NEGATIVE

## 2024-12-14 LAB — VAGINITIS SCREEN: DNA PROBE

## 2024-12-15 ENCOUNTER — Telehealth: Payer: Self-pay | Admitting: Family Medicine

## 2024-12-15 NOTE — Telephone Encounter (Signed)
 Microbiology called stating vaginitis was sent in the wrong container.  Patient will need to return to have it reswabbed.  Contacted patient advised if this advised she can return for nurse only visit to get swab we collected.  Patient is requesting a speculum exam again to have this collected because she does not want to do it herself.  Patient will return for a full visit

## 2024-12-20 ENCOUNTER — Other Ambulatory Visit: Payer: Self-pay

## 2024-12-20 ENCOUNTER — Encounter: Payer: Self-pay | Admitting: Obstetrics and Gynecology

## 2024-12-20 ENCOUNTER — Other Ambulatory Visit
Admission: RE | Admit: 2024-12-20 | Discharge: 2024-12-20 | Disposition: A | Discharge status: Home / Self Care | Payer: PRIVATE HEALTH INSURANCE | Source: Ambulatory Visit | Attending: Obstetrics and Gynecology | Admitting: Obstetrics and Gynecology

## 2024-12-20 ENCOUNTER — Ambulatory Visit: Payer: PRIVATE HEALTH INSURANCE | Attending: Obstetrics and Gynecology | Admitting: Obstetrics and Gynecology

## 2024-12-20 VITALS — BP 109/74 | HR 77 | Ht 63.0 in | Wt 192.7 lb

## 2024-12-20 DIAGNOSIS — Z3009 Encounter for other general counseling and advice on contraception: Secondary | ICD-10-CM | POA: Insufficient documentation

## 2024-12-20 DIAGNOSIS — Z113 Encounter for screening for infections with a predominantly sexual mode of transmission: Secondary | ICD-10-CM | POA: Insufficient documentation

## 2024-12-20 LAB — POCT VAGINAL WET MOUNT
CLUE CELLS,POC: ABSENT
KOH FOR FUNGUS: ABSENT
LEUKOCYTES,POC: ABSENT
Nitrazine PH, POCT: 4.5 (ref <=4.5)
TRICHOMONAS, POC: ABSENT
WHIFF TEST: NEGATIVE
YEAST,POC: ABSENT

## 2024-12-20 LAB — CHLAMYDIA NAAT (PCR): Chlamydia NAAT (PCR): NEGATIVE

## 2024-12-20 LAB — N. GONORRHOEAE NAAT (PCR): N. gonorrhoeae NAAT (PCR): NEGATIVE

## 2024-12-20 LAB — TRICHOMONAS NAAT: Trichomonas NAAT: NEGATIVE
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
## 2024-12-20 NOTE — Patient Instructions (Signed)
 2-1-1/Lif[facility]  24 hours per day, 7 days per week (including holidays) crisis/suicide intervention program and Information & Referral (I&R) service serving [place], [place], [place], [place], [place] and [place].  Assistance and referrals for:  Emergency food, shelter, clothing  Crisis counseling and substance abuse issues  Financial and legal issues  Physical and metals health needs  And more    Phone #: Dial 2-1-1 or 6621102708  Web site: www.[facility].org          Resources for Mental Health Support:     [facility] Crisis Call Line: [phone]  Hours of operation: Monday - Friday 7:00 am - 8:00 pm and Saturday & Sunday 10:00 am - 6:30 pm.     [place] COVID-[place] Emotional Support Line: [phone]     Lifeline: 211     [employer] Self-Help Drop-In Support Services  [address] [place], [place] [address]  Drop-In Hours: Daily 5:00 PM - 9:00 PM     [facility] Crisis [facility] at [facility]  [address] [place], [place] [address] [phone]  Monday - Friday 8:00 AM - 10:00 PM

## 2024-12-21 ENCOUNTER — Ambulatory Visit: Payer: Self-pay | Admitting: Obstetrics and Gynecology

## 2024-12-21 ENCOUNTER — Telehealth: Payer: Self-pay

## 2024-12-21 LAB — CHLAMYDIA NAAT (PCR)
Chlamydia NAAT (PCR): NEGATIVE
Chlamydia NAAT (PCR): NEGATIVE

## 2024-12-21 LAB — SYPHILIS SCREEN
Syphilis Screen: NEGATIVE
Syphilis Status: NONREACTIVE

## 2024-12-21 LAB — HEPATITIS B SURFACE ANTIGEN: HBV S Ag: NEGATIVE

## 2024-12-21 LAB — N. GONORRHOEAE NAAT (PCR)
N. gonorrhoeae NAAT (PCR): NEGATIVE
N. gonorrhoeae NAAT (PCR): NEGATIVE

## 2024-12-21 LAB — HIV-1/2  ANTIGEN/ANTIBODY SCREEN WITH CONFIRMATION: HIV 1&2 ANTIGEN/ANTIBODY: NONREACTIVE

## 2024-12-21 LAB — HEPATITIS C VIRUS ANTIBODY WITH REFLEX TO HEPATITIS C QUANTITATIVE: Hep C Ab: NEGATIVE

## 2024-12-21 LAB — PREGNANCY TEST, SERUM: Preg,Serum: NEGATIVE
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
## 2024-12-27 ENCOUNTER — Encounter: Payer: Self-pay | Admitting: Obstetrics and Gynecology

## 2024-12-27 ENCOUNTER — Other Ambulatory Visit: Payer: Self-pay

## 2024-12-27 ENCOUNTER — Ambulatory Visit: Payer: PRIVATE HEALTH INSURANCE | Attending: Obstetrics and Gynecology | Admitting: Obstetrics and Gynecology

## 2024-12-27 ENCOUNTER — Other Ambulatory Visit
Admission: RE | Admit: 2024-12-27 | Discharge: 2024-12-27 | Disposition: A | Discharge status: Home / Self Care | Payer: PRIVATE HEALTH INSURANCE | Source: Ambulatory Visit | Attending: Obstetrics and Gynecology | Admitting: Obstetrics and Gynecology

## 2024-12-27 ENCOUNTER — Ambulatory Visit: Payer: PRIVATE HEALTH INSURANCE | Attending: Psychiatry

## 2024-12-27 VITALS — BP 112/72 | HR 82 | Ht 63.0 in | Wt 202.8 lb

## 2024-12-27 DIAGNOSIS — Z113 Encounter for screening for infections with a predominantly sexual mode of transmission: Secondary | ICD-10-CM | POA: Insufficient documentation

## 2024-12-27 DIAGNOSIS — N898 Other specified noninflammatory disorders of vagina: Secondary | ICD-10-CM | POA: Insufficient documentation

## 2024-12-27 DIAGNOSIS — R69 Illness, unspecified: Secondary | ICD-10-CM | POA: Insufficient documentation

## 2024-12-27 DIAGNOSIS — R399 Unspecified symptoms and signs involving the genitourinary system: Secondary | ICD-10-CM | POA: Insufficient documentation

## 2024-12-27 DIAGNOSIS — F29 Unspecified psychosis not due to a substance or known physiological condition: Secondary | ICD-10-CM | POA: Insufficient documentation

## 2024-12-27 LAB — POCT 7 URINALYSIS DIPSTICK
Glucose,UA POCT: NORMAL mg/dL
Ketones,UA POCT: NEGATIVE mg/dL
Leuk Esterase,UA POCT: NEGATIVE
Lot #: 84216707
Nitrite,UA POCT: NEGATIVE
PH,UA POCT: 7 (ref 5–8)
Protein,UA POCT: NEGATIVE mg/dL

## 2024-12-27 NOTE — Progress Notes (Signed)
 Product/process Development Scientist Crisis referral per OBGYN provider due to escalating psychosis symptoms and threats to harm self.
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
## 2024-12-27 NOTE — Progress Notes (Signed)
 Mobile Crisis Triage: Unable to Make Contact  Writer called pt [phone] and lvm.

## 2024-12-28 ENCOUNTER — Encounter: Payer: Self-pay | Admitting: Psychiatry

## 2024-12-28 ENCOUNTER — Ambulatory Visit: Payer: Self-pay | Admitting: Obstetrics and Gynecology

## 2024-12-28 ENCOUNTER — Encounter: Payer: Self-pay | Admitting: Gastroenterology

## 2024-12-28 ENCOUNTER — Ambulatory Visit: Payer: Self-pay | Admitting: Psychiatry

## 2024-12-28 ENCOUNTER — Ambulatory Visit: Payer: Self-pay

## 2024-12-28 ENCOUNTER — Ambulatory Visit: Payer: PRIVATE HEALTH INSURANCE

## 2024-12-28 DIAGNOSIS — R69 Illness, unspecified: Secondary | ICD-10-CM

## 2024-12-28 DIAGNOSIS — F29 Unspecified psychosis not due to a substance or known physiological condition: Secondary | ICD-10-CM

## 2024-12-28 LAB — HEPATITIS C VIRUS ANTIBODY WITH REFLEX TO HEPATITIS C QUANTITATIVE
Hep C Ab: NEGATIVE
Hep C Ab: NEGATIVE

## 2024-12-28 LAB — HEPATITIS B SURFACE ANTIGEN
HBV S Ag: NEGATIVE
HBV S Ag: NEGATIVE

## 2024-12-28 LAB — N. GONORRHOEAE NAAT (PCR)
N. gonorrhoeae NAAT (PCR): NEGATIVE
N. gonorrhoeae NAAT (PCR): NEGATIVE
N. gonorrhoeae NAAT (PCR): NEGATIVE

## 2024-12-28 LAB — VAGINITIS SCREEN: DNA PROBE: Vaginitis Screen:DNA Probe: 0

## 2024-12-28 LAB — CHLAMYDIA NAAT (PCR)
Chlamydia NAAT (PCR): NEGATIVE
Chlamydia NAAT (PCR): NEGATIVE
Chlamydia NAAT (PCR): NEGATIVE

## 2024-12-28 LAB — HIV-1/2  ANTIGEN/ANTIBODY SCREEN WITH CONFIRMATION
HIV 1&2 ANTIGEN/ANTIBODY: NONREACTIVE
HIV 1&2 ANTIGEN/ANTIBODY: NONREACTIVE

## 2024-12-28 LAB — SYPHILIS SCREEN
Syphilis Screen: NEGATIVE
Syphilis Screen: NEGATIVE
Syphilis Status: NONREACTIVE
Syphilis Status: NONREACTIVE

## 2024-12-28 LAB — TRICHOMONAS NAAT: Trichomonas NAAT: NEGATIVE

## 2024-12-28 LAB — AEROBIC BACTERIAL URINE CULTURE: Aerobic bacterial urine culture: 0
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
## 2024-12-28 NOTE — Progress Notes (Addendum)
 Mobile Crisis Note [facility] T) arrived to address on referral at 1500.  Team unable to enter building as the front door is locked. Writer rang bell for [address] however no answer. Mailboxes can be seen from doorway which have pts name on [address].  Team called [phone] and LVM. Call to alternative number in chart Per chart review, pt is attending outpt mh tx at [facility] with therapy and medication management. Writer attempted to call clinic x3 however call would not go through. Call to [facility] who confim connection to [facility] and upcoming appt at 01/07 at 0830a. Writer messaged RS noting that team has been unsuccessful in reaching pt and discussed plan for additional outreach. Case pending at this time.

## 2024-12-28 NOTE — Progress Notes (Signed)
 Mobile Crisis Team Note On 12/28/2024 at 10AM [facility] team members (M. Ila, and C. Luvenia ) completed unannounced visit at patient's home, [address] [address], [place], [place]. MCT knocked on door and rang door bell several times with no response. There was a buzzer but the building door was locked.  Writer called patient at phone: [phone] (home) [phone] (work)  and data processing manager requesting a return phone call. Team will continue outreach to the home
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
## 2024-12-28 NOTE — Progress Notes (Signed)
 Mobile Crisis Triage: Unable to Make Contact  TC to pt @ [phone] to follow up. No answer, Writer left VM requesting return call. Due to concerns in referral, MCT to conduct an unannounced visit.

## 2024-12-29 ENCOUNTER — Ambulatory Visit: Payer: Self-pay

## 2024-12-29 NOTE — Progress Notes (Signed)
 MCT staff CONI Colas and J. Theal) attempted an unannounced visit to fulfill 9.45 signed by S. Marthann and N. Todd on 12/28/24. Staff were unable to get into patient's apartment building. Staff rang patient's doorbell multiple times with no response. Due to concerns in referral case will be pended for additional outreach.

## 2024-12-30 ENCOUNTER — Ambulatory Visit: Payer: PRIVATE HEALTH INSURANCE

## 2024-12-30 ENCOUNTER — Ambulatory Visit: Payer: Self-pay | Admitting: Psychiatry

## 2024-12-30 ENCOUNTER — Telehealth: Payer: Self-pay

## 2024-12-30 ENCOUNTER — Encounter: Payer: Self-pay | Admitting: Psychiatry

## 2024-12-30 DIAGNOSIS — F4320 Adjustment disorder, unspecified: Secondary | ICD-10-CM

## 2024-12-30 NOTE — Progress Notes (Deleted)
 See note doc only today

## 2024-12-30 NOTE — Progress Notes (Signed)
 Mobile Crisis Team Note On 12/30/2024 at 10:46 AM MCT team members (M. Nowell Sites, and sydney W ) completed unannounced visit at patient's home, [address] [address], [place], [place]. MCT knocked on door and rang door bell several times with no response. Attempted to reach her by phone but no answer. Due to concerns reported in referral, writer reached out to pt's emergency contact foster parent (great aunt) at [phone]. Was up all night, she needs medication - bad. The buzzers dont work at the building. Attempted to call patient while on the phone with MCT. Believes she needs to go to the hospital but doesn't want her to lose her apartment. Patient answered the phone for mom and said she was not going to come to the door today and would rather MCT come tomorrow. Discussed this with mother as pt has not answered in the last few days. MCT to do unannounced visit at [facility] app on 01/06 at 2:15pm to attempt to engage

## 2024-12-30 NOTE — Progress Notes (Signed)
 MOBILE CRISIS TEAM NOTEReceived two messages via CCL this evening from patient wishing to cancel MCT services.  Her speech was noted to be pressured during these calls and she was suspicious of MCT.  Given concerns of ongoing paranoia and delusional thought content, MCT will contact the OBGYN office to coordinate care.
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
## 2024-12-30 NOTE — Progress Notes (Signed)
 12/30/24 1837 Crisis Call Call Duration (minutes) 13 CCL Call Type Telephonic Crisis Intervention CCL Intervention Return to Current Provider Other - description  Note sent to MCT
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
## 2025-01-03 ENCOUNTER — Ambulatory Visit: Payer: Self-pay

## 2025-01-03 ENCOUNTER — Ambulatory Visit: Payer: Self-pay | Admitting: Obstetrics and Gynecology

## 2025-01-03 ENCOUNTER — Telehealth: Payer: Self-pay

## 2025-01-03 NOTE — Telephone Encounter (Signed)
 Patient asking for lab results , results given. Patient verbalized understanding.

## 2025-01-03 NOTE — Progress Notes (Signed)
 MCT staff CONI Colas and J. Dezzi) attempted an unannounced visit at patient's OBGYN office ([address] Upon the team's arrival patient called to reschedule OB visit for 01/11/25. Writer talked with staff who declined to have MCT present at patient's next appointment, agreed to call with any additional concerns. Case will be closed at this time.

## 2025-01-11 ENCOUNTER — Ambulatory Visit: Payer: Self-pay | Admitting: Obstetrics and Gynecology

## 2025-01-18 ENCOUNTER — Ambulatory Visit: Payer: Self-pay | Admitting: Obstetrics and Gynecology

## 2025-01-20 ENCOUNTER — Telehealth: Payer: Self-pay

## 2025-01-20 NOTE — Telephone Encounter (Signed)
 TC from pt looking to know when her next appointment is. Discussed that pt missed her appointment on 1/21 - pt would like to reschedule. Pt rescheduled for 2/2 @8 :45am. Pt agreeable to plan.

## 2025-01-30 ENCOUNTER — Ambulatory Visit: Payer: Self-pay | Admitting: Obstetrics and Gynecology

## 2025-01-30 ENCOUNTER — Telehealth: Payer: Self-pay

## 2025-01-31 ENCOUNTER — Emergency Department
Admission: EM | Admit: 2025-01-31 | Discharge: 2025-01-31 | Disposition: A | Discharge status: Home / Self Care | Source: Ambulatory Visit | Attending: Emergency Medicine | Admitting: Emergency Medicine

## 2025-01-31 DIAGNOSIS — Z789 Other specified health status: Secondary | ICD-10-CM

## 2025-01-31 DIAGNOSIS — G8929 Other chronic pain: Secondary | ICD-10-CM

## 2025-01-31 DIAGNOSIS — R0789 Other chest pain: Secondary | ICD-10-CM

## 2025-01-31 LAB — URINALYSIS WITH REFLEX TO CULTURE
Blood,UA: NEGATIVE
Glucose,UA: NEGATIVE
Ketones, UA: NEGATIVE
Leuk Esterase,UA: NEGATIVE
Nitrite,UA: NEGATIVE
Protein,UA: NEGATIVE
Specific Gravity,UA: 1.033 — ABNORMAL HIGH (ref 1.002–1.030)
pH,UA: 5.5 (ref 5.0–8.0)

## 2025-01-31 LAB — EKG 12-LEAD
P: 60 deg
PR: 157 ms
QRS: 77 deg
QRSD: 91 ms
QT: 390 ms
QTc: 407 ms
Rate: 65 {beats}/min

## 2025-01-31 LAB — N. GONORRHOEAE NAAT (PCR): N. gonorrhoeae NAAT (PCR): NEGATIVE

## 2025-01-31 LAB — CHLAMYDIA NAAT (PCR): Chlamydia NAAT (PCR): NEGATIVE

## 2025-01-31 LAB — PREGNANCY, URINE: Preg Test,UR: NEGATIVE

## 2025-01-31 MED ORDER — IBUPROFEN 400 MG PO TABS *I*
400.0000 mg | ORAL_TABLET | Freq: Once | ORAL | Status: AC
  Administered 2025-01-31: 400 mg via ORAL
  Filled 2025-01-31: qty 1

## 2025-01-31 MED ORDER — ACETAMINOPHEN 325 MG PO TABS *I*
650.0000 mg | ORAL_TABLET | Freq: Once | ORAL | Status: AC
  Administered 2025-01-31: 650 mg via ORAL
  Filled 2025-01-31: qty 2

## 2025-02-01 ENCOUNTER — Other Ambulatory Visit: Admission: RE | Admit: 2025-02-01 | Discharge: 2025-02-01 | Disposition: A | Source: Ambulatory Visit

## 2025-02-01 ENCOUNTER — Other Ambulatory Visit: Payer: Self-pay

## 2025-02-01 ENCOUNTER — Encounter: Payer: Self-pay | Admitting: Obstetrics and Gynecology

## 2025-02-01 ENCOUNTER — Ambulatory Visit: Payer: Self-pay | Admitting: Obstetrics and Gynecology

## 2025-02-01 VITALS — BP 111/62 | HR 79 | Ht 63.0 in | Wt 196.2 lb

## 2025-02-01 DIAGNOSIS — Z113 Encounter for screening for infections with a predominantly sexual mode of transmission: Secondary | ICD-10-CM

## 2025-02-02 ENCOUNTER — Ambulatory Visit: Payer: Self-pay | Admitting: Obstetrics and Gynecology

## 2025-02-02 ENCOUNTER — Encounter: Payer: Self-pay | Admitting: Obstetrics and Gynecology

## 2025-02-02 LAB — CHLAMYDIA NAAT (PCR)
Chlamydia NAAT (PCR): NEGATIVE
Chlamydia NAAT (PCR): NEGATIVE
Chlamydia NAAT (PCR): NEGATIVE

## 2025-02-02 LAB — SYPHILIS SCREEN
Syphilis Screen: NEGATIVE
Syphilis Status: NONREACTIVE

## 2025-02-02 LAB — HEPATITIS B SURFACE ANTIGEN: HBV S Ag: NEGATIVE

## 2025-02-02 LAB — TRICHOMONAS NAAT: Trichomonas NAAT: NEGATIVE

## 2025-02-02 LAB — N. GONORRHOEAE NAAT (PCR)
N. gonorrhoeae NAAT (PCR): NEGATIVE
N. gonorrhoeae NAAT (PCR): NEGATIVE
N. gonorrhoeae NAAT (PCR): NEGATIVE

## 2025-02-02 LAB — HIV-1/2  ANTIGEN/ANTIBODY SCREEN WITH CONFIRMATION: HIV 1&2 ANTIGEN/ANTIBODY: NONREACTIVE

## 2025-02-02 LAB — HEPATITIS C VIRUS ANTIBODY WITH REFLEX TO HEPATITIS C QUANTITATIVE: Hep C Ab: NEGATIVE
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
## 2025-02-08 ENCOUNTER — Ambulatory Visit: Payer: Self-pay | Admitting: Obstetrics and Gynecology

## 2025-02-28 ENCOUNTER — Ambulatory Visit: Payer: Self-pay | Admitting: Obstetrics and Gynecology
